# Patient Record
Sex: Male | Born: 1975 | Race: Black or African American | Hispanic: No | Marital: Single | Smoking: Current every day smoker
Health system: Southern US, Community
[De-identification: ages and names within clinical notes are randomized; demographics above are authoritative.]

---

## 2018-01-15 ENCOUNTER — Emergency Department (HOSPITAL_COMMUNITY)
Admission: EM | Admit: 2018-01-15 | Discharge: 2018-01-15 | Disposition: A | Discharge status: Home / Self Care | Payer: Self-pay | Attending: Emergency Medicine | Admitting: Emergency Medicine

## 2018-01-15 ENCOUNTER — Emergency Department (HOSPITAL_COMMUNITY): Payer: Self-pay

## 2018-01-15 DIAGNOSIS — F17200 Nicotine dependence, unspecified, uncomplicated: Secondary | ICD-10-CM | POA: Insufficient documentation

## 2018-01-15 DIAGNOSIS — Z79899 Other long term (current) drug therapy: Secondary | ICD-10-CM | POA: Insufficient documentation

## 2018-01-15 DIAGNOSIS — R42 Dizziness and giddiness: Secondary | ICD-10-CM | POA: Insufficient documentation

## 2018-01-15 DIAGNOSIS — F1610 Hallucinogen abuse, uncomplicated: Secondary | ICD-10-CM | POA: Insufficient documentation

## 2018-01-15 DIAGNOSIS — F1210 Cannabis abuse, uncomplicated: Secondary | ICD-10-CM | POA: Insufficient documentation

## 2018-01-15 DIAGNOSIS — I10 Essential (primary) hypertension: Secondary | ICD-10-CM | POA: Insufficient documentation

## 2018-01-15 LAB — BASIC METABOLIC PANEL
ANION GAP: 12 (ref 5–15)
BUN: 7 mg/dL (ref 6–20)
CO2: 24 mmol/L (ref 22–32)
Calcium: 9.4 mg/dL (ref 8.9–10.3)
Chloride: 104 mmol/L (ref 101–111)
Creatinine, Ser: 1.05 mg/dL (ref 0.61–1.24)
GFR calc Af Amer: >60 mL/min (ref >60)
GFR calc non Af Amer: >60 mL/min (ref >60)
Glucose, Bld: 97 mg/dL (ref 65–99)
POTASSIUM: 3.8 mmol/L (ref 3.5–5.1)
Sodium: 140 mmol/L (ref 135–145)

## 2018-01-15 LAB — CBC
HEMATOCRIT: 53 % — AB (ref 39.0–52.0)
Hemoglobin: 17 g/dL (ref 13.0–17.0)
MCH: 31.7 pg (ref 26.0–34.0)
MCHC: 32.1 g/dL (ref 30.0–36.0)
MCV: 98.7 fL (ref 78.0–100.0)
Platelets: 384 10*3/uL (ref 150–400)
RBC: 5.37 MIL/uL (ref 4.22–5.81)
RDW: 13.2 % (ref 11.5–15.5)
WBC: 6.1 10*3/uL (ref 4.0–10.5)

## 2018-01-15 LAB — URINALYSIS, ROUTINE W REFLEX MICROSCOPIC
Bilirubin Urine: NEGATIVE
Glucose, UA: NEGATIVE mg/dL
Hgb urine dipstick: NEGATIVE
KETONES UR: NEGATIVE mg/dL
Leukocytes, UA: NEGATIVE
NITRITE: NEGATIVE
PH: 7 (ref 5.0–8.0)
PROTEIN: NEGATIVE mg/dL
Specific Gravity, Urine: 1.01 (ref 1.005–1.030)

## 2018-01-15 LAB — CBG MONITORING, ED: Glucose-Capillary: 105 mg/dL — ABNORMAL HIGH (ref 65–99)

## 2018-01-15 MED ORDER — AMLODIPINE BESYLATE 5 MG PO TABS: 5.0000 mg | ORAL_TABLET | Freq: Every day | ORAL | 0 refills | Status: DC

## 2018-01-15 MED ORDER — AMLODIPINE BESYLATE 5 MG PO TABS
5.0000 mg | ORAL_TABLET | Freq: Once | ORAL | Status: AC
  Administered 2018-01-15: 5 mg via ORAL
  Filled 2018-01-15: qty 1

## 2018-01-15 NOTE — ED Notes (Addendum)
Patient complains of intermittent headache and dizziness that started Friday while at work. Patient denies both today. Patient reports his symptoms start when he starts to get hot. He works in a [employer] and states it gets very hot inside. Patient also states he has felt dizzy while working outside in the sun. Patient ambulated for lobby to treatment room independently without difficulty. No neuro deficits.

## 2018-01-15 NOTE — ED Provider Notes (Signed)
[facility] EMERGENCY DEPARTMENT Provider Note   CSN: [id number] Arrival date & time: 01/15/18  7829     History   Chief Complaint Chief Complaint  Patient presents with  . Dizziness    HPI Griffyn Kucinski is a 42 y.o. male.  The history is provided by the patient. No language interpreter was used.  Dizziness   Ervey Fann is a 42 y.o. male who presents to the Emergency Department complaining of dizziness. He reports episodic dizziness since Friday. Friday morning while at work he felt lightheaded and needed to sit down in the symptoms quickly past. Today when he was driving to work he developed recurrent dizziness described as lightheadedness. He decided to come into the emergency department for evaluation at that time. During these episodes he feels hot and sweaty. He denies any headaches, vision changes, chest pain, numbness, weakness, nausea, vomiting, leg swelling or pain. Four years ago he was told he had high blood pressure but never got further evaluation and is not been on any medications. He is a smoker and drinks alcohol on the weekends. He uses marijuana. He has used ecstasy three times, last use was a week and half ago.  No cocaine use.   No past medical history on file.  There are no active problems to display for this patient.         Home Medications    Prior to Admission medications   Medication Sig Start Date End Date Taking? Authorizing Provider  amLODipine (NORVASC) 5 MG tablet Take 1 tablet (5 mg total) by mouth daily. 01/15/18   Tilden Fossa, MD    Family History No family history on file.  Social History Social History   Tobacco Use  . Smoking status: Not on file  Substance Use Topics  . Alcohol use: Not on file  . Drug use: Not on file     Allergies   Patient has no allergy information on record.   Review of Systems Review of Systems  Neurological: Positive for dizziness.  All other systems reviewed and  are negative.    Physical Exam Updated Vital Signs BP (!) 185/120   Pulse 65   Temp 98.1 F (36.7 C) (Oral)   Resp 20   SpO2 97%   Physical Exam  Constitutional: He is oriented to person, place, and time. He appears well-developed and well-nourished.  HENT:  Head: Normocephalic and atraumatic.  Cardiovascular: Normal rate and regular rhythm.  No murmur heard. Pulmonary/Chest: Effort normal and breath sounds normal. No respiratory distress.  Abdominal: Soft. There is no tenderness. There is no rebound and no guarding.  Musculoskeletal: He exhibits no edema or tenderness.  Neurological: He is alert and oriented to person, place, and time. No cranial nerve deficit or sensory deficit. Coordination normal.  No pronator drift.  No ataxia on FTN bilaterally.  Visual fields grossly intact.    Skin: Skin is warm. He is diaphoretic.  Psychiatric: He has a normal mood and affect. His behavior is normal.  Nursing note and vitals reviewed.    ED Treatments / Results  Labs (all labs ordered are listed, but only abnormal results are displayed) Labs Reviewed  CBC - Abnormal; Notable for the following components:      Result Value   HCT 53.0 (*)    All other components within normal limits  URINALYSIS, ROUTINE W REFLEX MICROSCOPIC - Abnormal; Notable for the following components:   Color, Urine STRAW (*)    All other components  within normal limits  CBG MONITORING, ED - Abnormal; Notable for the following components:   Glucose-Capillary 105 (*)    All other components within normal limits  BASIC METABOLIC PANEL  I-STAT TROPONIN, ED    EKG EKG Interpretation  Date/Time:  Tuesday Jan 15 2018 07:42:12 EDT Ventricular Rate:  85 PR Interval:  162 QRS Duration: 82 QT Interval:  392 QTC Calculation: 466 R Axis:   48 Text Interpretation:  Normal sinus rhythm Cannot rule out Inferior infarct , age undetermined Abnormal ECG no prior available for comparison Confirmed by Tilden Fossa  [phone]) on 01/15/2018 8:58:56 AM   Radiology Ct Head Wo Contrast  Result Date: 01/15/2018 CLINICAL DATA:  Headache for 4 days. EXAM: CT HEAD WITHOUT CONTRAST TECHNIQUE: Contiguous axial images were obtained from the base of the skull through the vertex without intravenous contrast. COMPARISON:  None. FINDINGS: Brain: No evidence of acute infarction, hemorrhage, hydrocephalus, extra-axial collection or mass lesion/mass effect. Vascular: Negative. Skull: Intact. Sinuses/Orbits: Negative. Other: None. IMPRESSION: Normal head CT. Electronically Signed   By: Drusilla Kanner M.D.   On: 01/15/2018 11:35    Procedures Procedures (including critical care time)  Medications Ordered in ED Medications  amLODipine (NORVASC) tablet 5 mg (has no administration in time range)     Initial Impression / Assessment and Plan / ED Course  I have reviewed the triage vital signs and the nursing notes.  Pertinent labs & imaging results that were available during my care of the patient were reviewed by me and considered in my medical decision making (see chart for details).     Patient with history of hypertension, not on any antihypertensives here for evaluation of dizziness with diaphoresis, elevated blood pressure. Patient diaphoretic on ED arrival and is quickly cleared. He is well appearing on repeat assessment. No focal neurologic deficits and he is completely asymptomatic on reassessment. Blood pressure is elevated but no evidence of hypertensive urgency/emergency, ACS, dissection, CVA. No evidence of and organ dysfunction. Discussed with patient hypertension and home care. Discussed importance of establishing PCP, will refer to [place] [facility] until he has one established. Discussed close return precautions.  Final Clinical Impressions(s) / ED Diagnoses   Final diagnoses:  Dizziness  Essential hypertension    ED Discharge Orders        Ordered    amLODipine (NORVASC) 5 MG tablet  Daily       01/15/18 1248       Tilden Fossa, MD 01/15/18 1251

## 2019-06-15 IMAGING — CT CT HEAD W/O CM
3 series · 16 of 47 positions shown, 19 images · non-contrast
Comparison: None.

CLINICAL DATA: Headache for 4 days.

EXAM:
CT HEAD WITHOUT CONTRAST
TECHNIQUE: Contiguous axial images were obtained from the base of the skull
through the vertex without intravenous contrast.

[Series 3: head 5.0 h30s · axial · 0.42mm/px · z∈[-175,-30]mm · 10 of 35 slices shown, 13 images]
[im 3/35  brain]
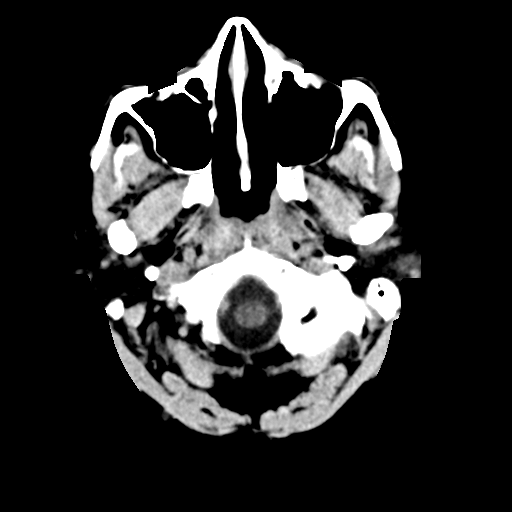
[im 3/35  bone]
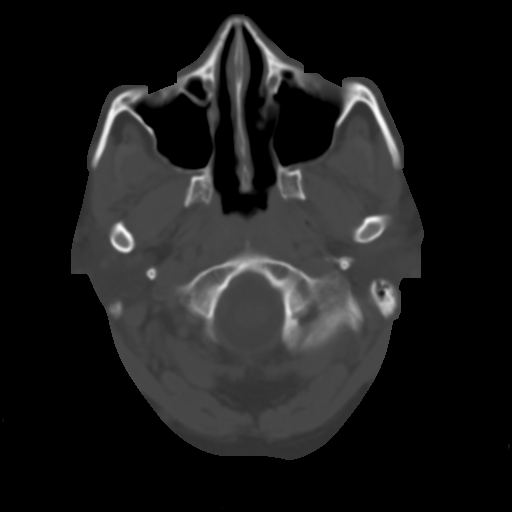
[im 6/35  brain]
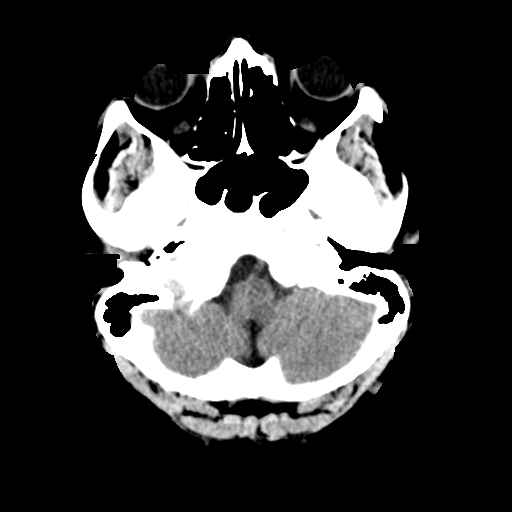
[im 10/35  brain]
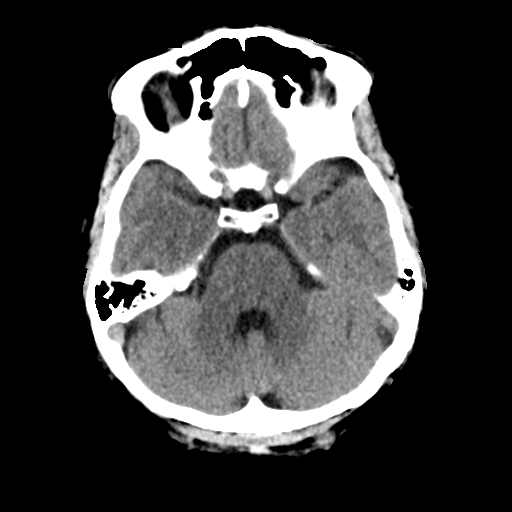
[im 12/35  brain]
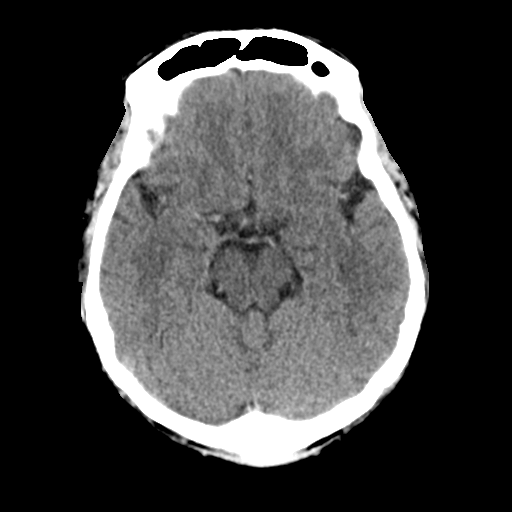
[im 16/35  brain]
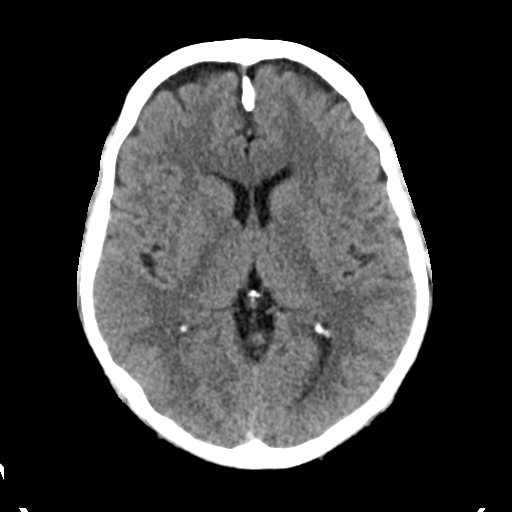
[im 16/35  bone]
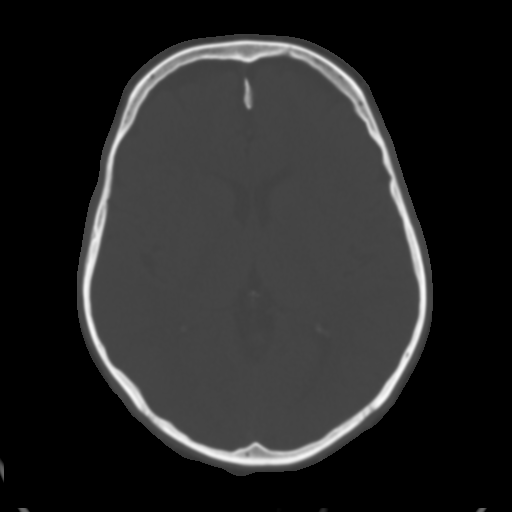
[im 19/35  brain]
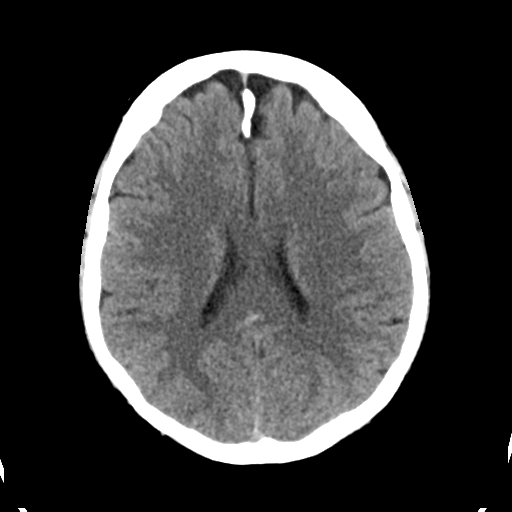
[im 23/35  brain]
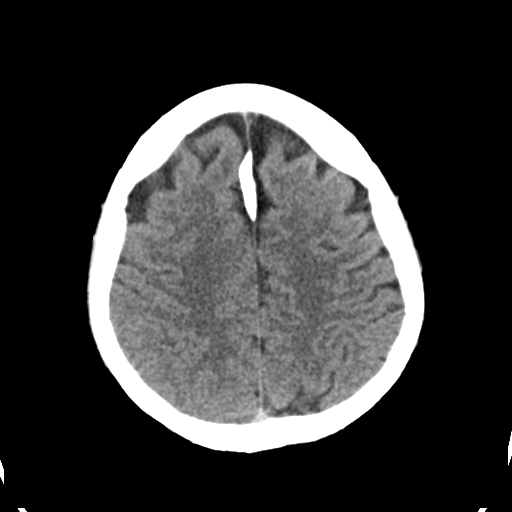
[im 26/35  brain]
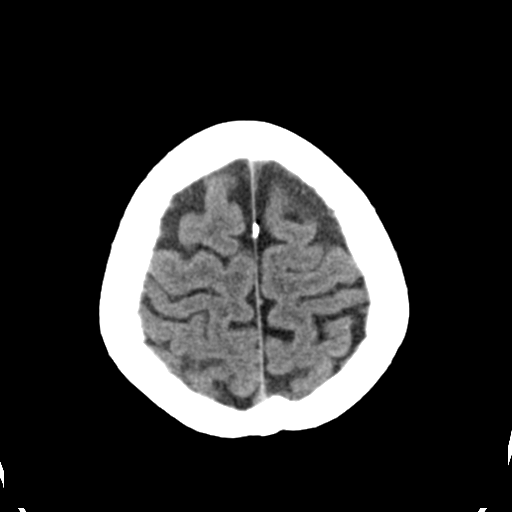
[im 29/35  brain]
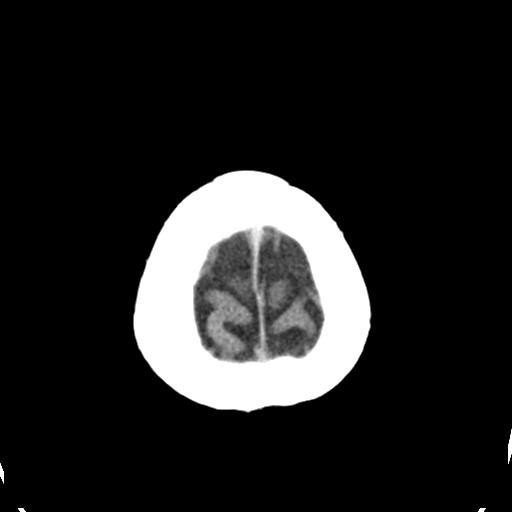
[im 29/35  bone]
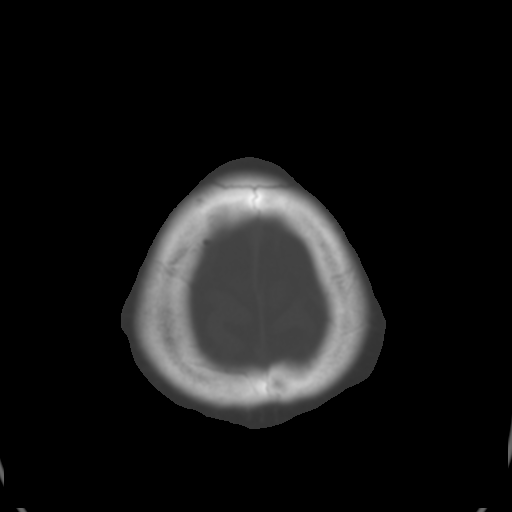
[im 32/35  brain]
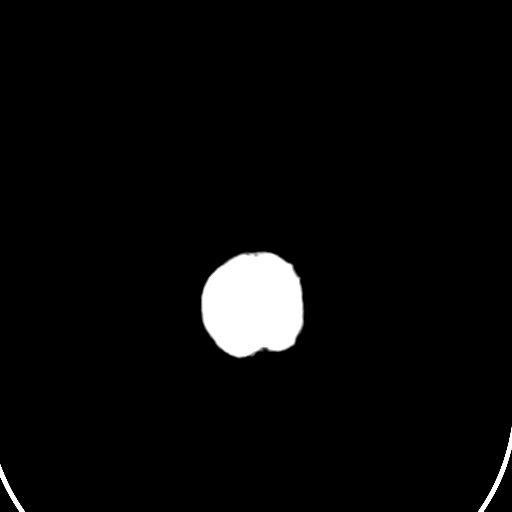

[Series 5: head 3.0 mpr cor · coronal · 0.34mm/px · 3 of 68 slices shown]
[im 23/68  brain]
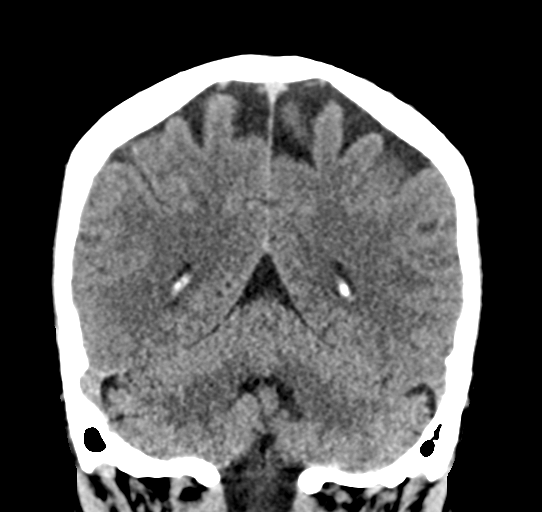
[im 30/68  brain]
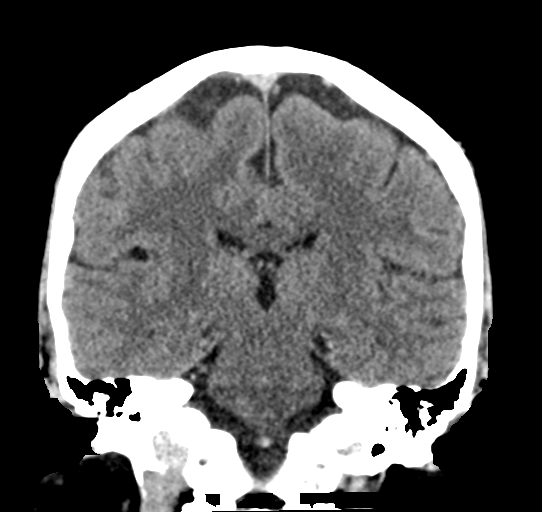
[im 38/68  brain]
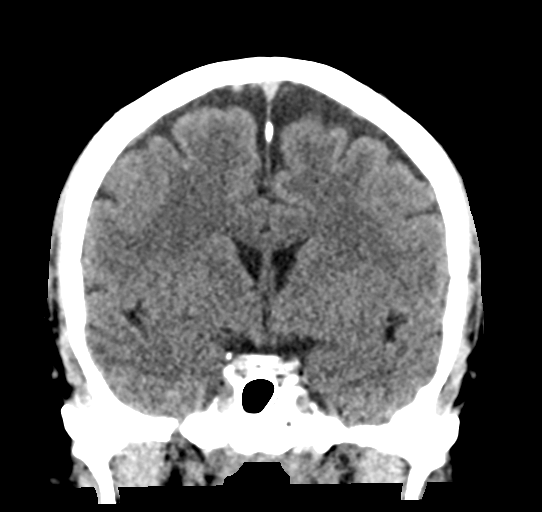

[Series 6: head 3.0 mpr sag · sagittal · 0.34mm/px · 3 of 59 slices shown]
[im 20/59  brain]
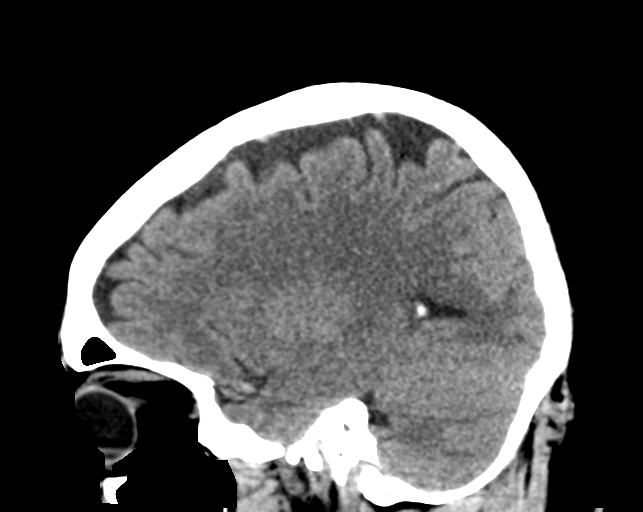
[im 30/59  brain]
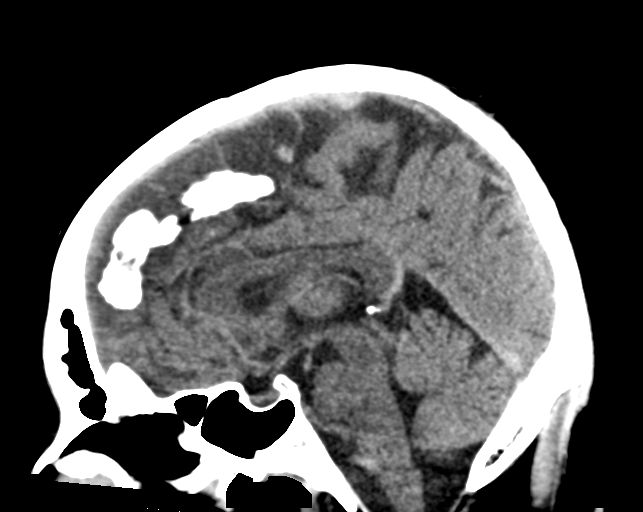
[im 39/59  brain]
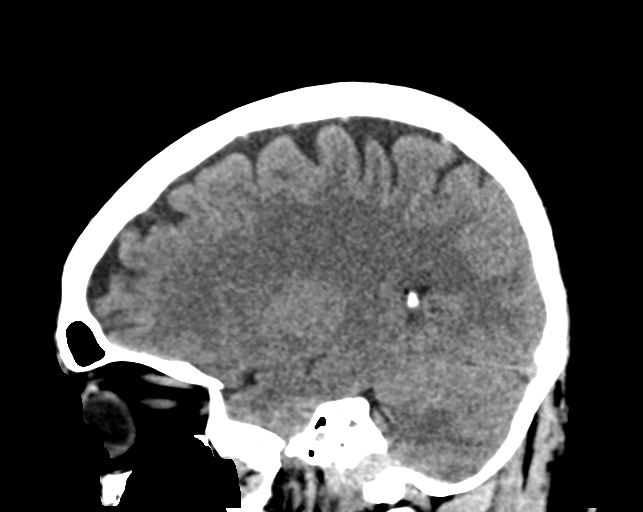

[16 of 47 positions shown; findings below may reference images not displayed]

FINDINGS: Brain: No evidence of acute infarction, hemorrhage, hydrocephalus,
extra-axial collection or mass lesion/mass effect.

Vascular: Negative.

Skull: Intact.

Sinuses/Orbits: Negative.

Other: None.
IMPRESSION: Normal head CT.

## 2023-10-27 ENCOUNTER — Inpatient Hospital Stay (HOSPITAL_COMMUNITY)
Admission: EM | Admit: 2023-10-27 | Discharge: 2024-06-21 | DRG: 003 | Disposition: A | Discharge status: Nursing Home (Includes ICF) | Payer: MEDICAID | Attending: Neurology | Admitting: Neurology

## 2023-10-27 ENCOUNTER — Emergency Department (HOSPITAL_COMMUNITY): Payer: MEDICAID

## 2023-10-27 ENCOUNTER — Encounter (HOSPITAL_COMMUNITY): Payer: Self-pay

## 2023-10-27 ENCOUNTER — Inpatient Hospital Stay (HOSPITAL_COMMUNITY): Payer: MEDICAID

## 2023-10-27 ENCOUNTER — Other Ambulatory Visit: Payer: Self-pay

## 2023-10-27 DIAGNOSIS — F17210 Nicotine dependence, cigarettes, uncomplicated: Secondary | ICD-10-CM | POA: Diagnosis not present

## 2023-10-27 DIAGNOSIS — H109 Unspecified conjunctivitis: Secondary | ICD-10-CM | POA: Diagnosis not present

## 2023-10-27 DIAGNOSIS — R402A Nontraumatic coma due to underlying condition: Secondary | ICD-10-CM | POA: Diagnosis present

## 2023-10-27 DIAGNOSIS — R6510 Systemic inflammatory response syndrome (SIRS) of non-infectious origin without acute organ dysfunction: Secondary | ICD-10-CM | POA: Diagnosis present

## 2023-10-27 DIAGNOSIS — Z79899 Other long term (current) drug therapy: Secondary | ICD-10-CM

## 2023-10-27 DIAGNOSIS — G931 Anoxic brain damage, not elsewhere classified: Secondary | ICD-10-CM | POA: Diagnosis present

## 2023-10-27 DIAGNOSIS — Z430 Encounter for attention to tracheostomy: Secondary | ICD-10-CM

## 2023-10-27 DIAGNOSIS — Z515 Encounter for palliative care: Secondary | ICD-10-CM | POA: Diagnosis not present

## 2023-10-27 DIAGNOSIS — Z7189 Other specified counseling: Secondary | ICD-10-CM | POA: Diagnosis not present

## 2023-10-27 DIAGNOSIS — E43 Unspecified severe protein-calorie malnutrition: Secondary | ICD-10-CM | POA: Diagnosis present

## 2023-10-27 DIAGNOSIS — J95851 Ventilator associated pneumonia: Secondary | ICD-10-CM | POA: Diagnosis not present

## 2023-10-27 DIAGNOSIS — K029 Dental caries, unspecified: Secondary | ICD-10-CM | POA: Diagnosis present

## 2023-10-27 DIAGNOSIS — G9341 Metabolic encephalopathy: Secondary | ICD-10-CM | POA: Diagnosis not present

## 2023-10-27 DIAGNOSIS — Z931 Gastrostomy status: Secondary | ICD-10-CM

## 2023-10-27 DIAGNOSIS — Z8709 Personal history of other diseases of the respiratory system: Secondary | ICD-10-CM | POA: Diagnosis not present

## 2023-10-27 DIAGNOSIS — R403 Persistent vegetative state: Secondary | ICD-10-CM | POA: Diagnosis present

## 2023-10-27 DIAGNOSIS — J9621 Acute and chronic respiratory failure with hypoxia: Secondary | ICD-10-CM | POA: Diagnosis present

## 2023-10-27 DIAGNOSIS — R1311 Dysphagia, oral phase: Secondary | ICD-10-CM | POA: Diagnosis not present

## 2023-10-27 DIAGNOSIS — I471 Supraventricular tachycardia, unspecified: Secondary | ICD-10-CM | POA: Diagnosis not present

## 2023-10-27 DIAGNOSIS — Z789 Other specified health status: Secondary | ICD-10-CM | POA: Diagnosis not present

## 2023-10-27 DIAGNOSIS — E876 Hypokalemia: Secondary | ICD-10-CM | POA: Diagnosis not present

## 2023-10-27 DIAGNOSIS — I613 Nontraumatic intracerebral hemorrhage in brain stem: Secondary | ICD-10-CM | POA: Diagnosis present

## 2023-10-27 DIAGNOSIS — H0520 Unspecified exophthalmos: Secondary | ICD-10-CM | POA: Diagnosis present

## 2023-10-27 DIAGNOSIS — Z91018 Allergy to other foods: Secondary | ICD-10-CM

## 2023-10-27 DIAGNOSIS — Z6841 Body Mass Index (BMI) 40.0 and over, adult: Secondary | ICD-10-CM

## 2023-10-27 DIAGNOSIS — I161 Hypertensive emergency: Secondary | ICD-10-CM | POA: Diagnosis present

## 2023-10-27 DIAGNOSIS — G936 Cerebral edema: Secondary | ICD-10-CM | POA: Diagnosis not present

## 2023-10-27 DIAGNOSIS — B9562 Methicillin resistant Staphylococcus aureus infection as the cause of diseases classified elsewhere: Secondary | ICD-10-CM | POA: Diagnosis not present

## 2023-10-27 DIAGNOSIS — D75839 Thrombocytosis, unspecified: Secondary | ICD-10-CM | POA: Diagnosis not present

## 2023-10-27 DIAGNOSIS — J690 Pneumonitis due to inhalation of food and vomit: Secondary | ICD-10-CM | POA: Diagnosis present

## 2023-10-27 DIAGNOSIS — I629 Nontraumatic intracranial hemorrhage, unspecified: Secondary | ICD-10-CM | POA: Diagnosis present

## 2023-10-27 DIAGNOSIS — I952 Hypotension due to drugs: Secondary | ICD-10-CM | POA: Diagnosis not present

## 2023-10-27 DIAGNOSIS — N179 Acute kidney failure, unspecified: Secondary | ICD-10-CM | POA: Diagnosis not present

## 2023-10-27 DIAGNOSIS — J9600 Acute respiratory failure, unspecified whether with hypoxia or hypercapnia: Secondary | ICD-10-CM

## 2023-10-27 DIAGNOSIS — Z930 Tracheostomy status: Secondary | ICD-10-CM

## 2023-10-27 DIAGNOSIS — A419 Sepsis, unspecified organism: Secondary | ICD-10-CM | POA: Diagnosis not present

## 2023-10-27 DIAGNOSIS — E878 Other disorders of electrolyte and fluid balance, not elsewhere classified: Secondary | ICD-10-CM | POA: Diagnosis present

## 2023-10-27 DIAGNOSIS — I611 Nontraumatic intracerebral hemorrhage in hemisphere, cortical: Secondary | ICD-10-CM | POA: Diagnosis not present

## 2023-10-27 DIAGNOSIS — D509 Iron deficiency anemia, unspecified: Secondary | ICD-10-CM | POA: Diagnosis not present

## 2023-10-27 DIAGNOSIS — Y848 Other medical procedures as the cause of abnormal reaction of the patient, or of later complication, without mention of misadventure at the time of the procedure: Secondary | ICD-10-CM | POA: Diagnosis not present

## 2023-10-27 DIAGNOSIS — D539 Nutritional anemia, unspecified: Secondary | ICD-10-CM | POA: Diagnosis not present

## 2023-10-27 DIAGNOSIS — I609 Nontraumatic subarachnoid hemorrhage, unspecified: Secondary | ICD-10-CM | POA: Diagnosis present

## 2023-10-27 DIAGNOSIS — Y732 Prosthetic and other implants, materials and accessory gastroenterology and urology devices associated with adverse incidents: Secondary | ICD-10-CM | POA: Diagnosis not present

## 2023-10-27 DIAGNOSIS — J189 Pneumonia, unspecified organism: Secondary | ICD-10-CM | POA: Diagnosis not present

## 2023-10-27 DIAGNOSIS — R569 Unspecified convulsions: Secondary | ICD-10-CM | POA: Diagnosis not present

## 2023-10-27 DIAGNOSIS — J9620 Acute and chronic respiratory failure, unspecified whether with hypoxia or hypercapnia: Secondary | ICD-10-CM | POA: Diagnosis not present

## 2023-10-27 DIAGNOSIS — R1310 Dysphagia, unspecified: Secondary | ICD-10-CM | POA: Diagnosis present

## 2023-10-27 DIAGNOSIS — J0410 Acute tracheitis without obstruction: Secondary | ICD-10-CM | POA: Diagnosis not present

## 2023-10-27 DIAGNOSIS — J15212 Pneumonia due to Methicillin resistant Staphylococcus aureus: Secondary | ICD-10-CM | POA: Diagnosis not present

## 2023-10-27 DIAGNOSIS — J9611 Chronic respiratory failure with hypoxia: Secondary | ICD-10-CM | POA: Diagnosis not present

## 2023-10-27 DIAGNOSIS — L8990 Pressure ulcer of unspecified site, unspecified stage: Secondary | ICD-10-CM

## 2023-10-27 DIAGNOSIS — E8729 Other acidosis: Secondary | ICD-10-CM | POA: Diagnosis not present

## 2023-10-27 DIAGNOSIS — I472 Ventricular tachycardia, unspecified: Secondary | ICD-10-CM | POA: Diagnosis not present

## 2023-10-27 DIAGNOSIS — G835 Locked-in state: Secondary | ICD-10-CM | POA: Diagnosis present

## 2023-10-27 DIAGNOSIS — Z978 Presence of other specified devices: Secondary | ICD-10-CM | POA: Diagnosis not present

## 2023-10-27 DIAGNOSIS — B9620 Unspecified Escherichia coli [E. coli] as the cause of diseases classified elsewhere: Secondary | ICD-10-CM | POA: Diagnosis present

## 2023-10-27 DIAGNOSIS — I619 Nontraumatic intracerebral hemorrhage, unspecified: Principal | ICD-10-CM

## 2023-10-27 DIAGNOSIS — I612 Nontraumatic intracerebral hemorrhage in hemisphere, unspecified: Secondary | ICD-10-CM | POA: Diagnosis not present

## 2023-10-27 DIAGNOSIS — E872 Acidosis, unspecified: Secondary | ICD-10-CM | POA: Diagnosis present

## 2023-10-27 DIAGNOSIS — H16212 Exposure keratoconjunctivitis, left eye: Secondary | ICD-10-CM | POA: Diagnosis not present

## 2023-10-27 DIAGNOSIS — T4275XA Adverse effect of unspecified antiepileptic and sedative-hypnotic drugs, initial encounter: Secondary | ICD-10-CM | POA: Diagnosis not present

## 2023-10-27 DIAGNOSIS — Z8614 Personal history of Methicillin resistant Staphylococcus aureus infection: Secondary | ICD-10-CM

## 2023-10-27 DIAGNOSIS — E440 Moderate protein-calorie malnutrition: Secondary | ICD-10-CM | POA: Diagnosis not present

## 2023-10-27 DIAGNOSIS — G935 Compression of brain: Secondary | ICD-10-CM | POA: Diagnosis not present

## 2023-10-27 DIAGNOSIS — K9423 Gastrostomy malfunction: Secondary | ICD-10-CM | POA: Diagnosis not present

## 2023-10-27 DIAGNOSIS — F1190 Opioid use, unspecified, uncomplicated: Secondary | ICD-10-CM | POA: Diagnosis not present

## 2023-10-27 DIAGNOSIS — Z1152 Encounter for screening for COVID-19: Secondary | ICD-10-CM

## 2023-10-27 DIAGNOSIS — J208 Acute bronchitis due to other specified organisms: Secondary | ICD-10-CM | POA: Diagnosis not present

## 2023-10-27 DIAGNOSIS — E538 Deficiency of other specified B group vitamins: Secondary | ICD-10-CM | POA: Diagnosis present

## 2023-10-27 DIAGNOSIS — D649 Anemia, unspecified: Secondary | ICD-10-CM | POA: Diagnosis not present

## 2023-10-27 DIAGNOSIS — I615 Nontraumatic intracerebral hemorrhage, intraventricular: Secondary | ICD-10-CM | POA: Diagnosis present

## 2023-10-27 DIAGNOSIS — Z66 Do not resuscitate: Secondary | ICD-10-CM | POA: Diagnosis present

## 2023-10-27 DIAGNOSIS — I69891 Dysphagia following other cerebrovascular disease: Secondary | ICD-10-CM | POA: Diagnosis not present

## 2023-10-27 DIAGNOSIS — Y95 Nosocomial condition: Secondary | ICD-10-CM | POA: Diagnosis present

## 2023-10-27 DIAGNOSIS — H11422 Conjunctival edema, left eye: Secondary | ICD-10-CM | POA: Diagnosis present

## 2023-10-27 DIAGNOSIS — N390 Urinary tract infection, site not specified: Secondary | ICD-10-CM | POA: Diagnosis present

## 2023-10-27 DIAGNOSIS — F1290 Cannabis use, unspecified, uncomplicated: Secondary | ICD-10-CM | POA: Diagnosis present

## 2023-10-27 DIAGNOSIS — J9622 Acute and chronic respiratory failure with hypercapnia: Secondary | ICD-10-CM | POA: Diagnosis present

## 2023-10-27 DIAGNOSIS — Z09 Encounter for follow-up examination after completed treatment for conditions other than malignant neoplasm: Secondary | ICD-10-CM | POA: Diagnosis not present

## 2023-10-27 DIAGNOSIS — H11412 Vascular abnormalities of conjunctiva, left eye: Secondary | ICD-10-CM | POA: Diagnosis not present

## 2023-10-27 DIAGNOSIS — E870 Hyperosmolality and hypernatremia: Secondary | ICD-10-CM | POA: Diagnosis not present

## 2023-10-27 DIAGNOSIS — M7989 Other specified soft tissue disorders: Secondary | ICD-10-CM | POA: Diagnosis not present

## 2023-10-27 DIAGNOSIS — J9602 Acute respiratory failure with hypercapnia: Secondary | ICD-10-CM | POA: Diagnosis not present

## 2023-10-27 DIAGNOSIS — Z9911 Dependence on respirator [ventilator] status: Secondary | ICD-10-CM

## 2023-10-27 DIAGNOSIS — I10 Essential (primary) hypertension: Secondary | ICD-10-CM | POA: Diagnosis present

## 2023-10-27 DIAGNOSIS — J9601 Acute respiratory failure with hypoxia: Secondary | ICD-10-CM

## 2023-10-27 DIAGNOSIS — H179 Unspecified corneal scar and opacity: Secondary | ICD-10-CM | POA: Diagnosis present

## 2023-10-27 DIAGNOSIS — G8250 Quadriplegia, unspecified: Secondary | ICD-10-CM | POA: Diagnosis not present

## 2023-10-27 DIAGNOSIS — J9811 Atelectasis: Secondary | ICD-10-CM | POA: Diagnosis not present

## 2023-10-27 DIAGNOSIS — D638 Anemia in other chronic diseases classified elsewhere: Secondary | ICD-10-CM | POA: Diagnosis present

## 2023-10-27 DIAGNOSIS — Z8701 Personal history of pneumonia (recurrent): Secondary | ICD-10-CM

## 2023-10-27 DIAGNOSIS — F1990 Other psychoactive substance use, unspecified, uncomplicated: Secondary | ICD-10-CM | POA: Diagnosis not present

## 2023-10-27 DIAGNOSIS — J9691 Respiratory failure, unspecified with hypoxia: Secondary | ICD-10-CM | POA: Diagnosis not present

## 2023-10-27 DIAGNOSIS — H5509 Other forms of nystagmus: Secondary | ICD-10-CM | POA: Diagnosis present

## 2023-10-27 DIAGNOSIS — Z22322 Carrier or suspected carrier of Methicillin resistant Staphylococcus aureus: Secondary | ICD-10-CM

## 2023-10-27 DIAGNOSIS — A4902 Methicillin resistant Staphylococcus aureus infection, unspecified site: Secondary | ICD-10-CM | POA: Diagnosis not present

## 2023-10-27 DIAGNOSIS — B965 Pseudomonas (aeruginosa) (mallei) (pseudomallei) as the cause of diseases classified elsewhere: Secondary | ICD-10-CM | POA: Diagnosis not present

## 2023-10-27 DIAGNOSIS — H168 Other keratitis: Secondary | ICD-10-CM | POA: Diagnosis present

## 2023-10-27 DIAGNOSIS — K567 Ileus, unspecified: Secondary | ICD-10-CM | POA: Diagnosis not present

## 2023-10-27 DIAGNOSIS — R509 Fever, unspecified: Secondary | ICD-10-CM | POA: Diagnosis not present

## 2023-10-27 DIAGNOSIS — J150 Pneumonia due to Klebsiella pneumoniae: Secondary | ICD-10-CM | POA: Diagnosis not present

## 2023-10-27 DIAGNOSIS — R000 Tachycardia, unspecified: Secondary | ICD-10-CM | POA: Diagnosis not present

## 2023-10-27 DIAGNOSIS — G253 Myoclonus: Secondary | ICD-10-CM | POA: Diagnosis present

## 2023-10-27 LAB — I-STAT ARTERIAL BLOOD GAS, ED
Acid-base deficit: 4 mmol/L — ABNORMAL HIGH (ref 0.0–2.0)
Bicarbonate: 26.4 mmol/L (ref 20.0–28.0)
Calcium, Ion: 1.16 mmol/L (ref 1.15–1.40)
HCT: 44 % (ref 39.0–52.0)
Hemoglobin: 15 g/dL (ref 13.0–17.0)
O2 Saturation: 96 %
Patient temperature: 100
Potassium: 3.3 mmol/L — ABNORMAL LOW (ref 3.5–5.1)
Sodium: 143 mmol/L (ref 135–145)
TCO2: 29 mmol/L (ref 22–32)
pCO2 arterial: 73.3 mmHg (ref 32–48)
pH, Arterial: 7.169 — CL (ref 7.35–7.45)
pO2, Arterial: 114 mmHg — ABNORMAL HIGH (ref 83–108)

## 2023-10-27 LAB — HEMOGLOBIN A1C
Hgb A1c MFr Bld: 4.7 % — ABNORMAL LOW (ref 4.8–5.6)
Mean Plasma Glucose: 88.19 mg/dL

## 2023-10-27 LAB — CBC WITH DIFFERENTIAL/PLATELET
Abs Immature Granulocytes: 0.12 10*3/uL — ABNORMAL HIGH (ref 0.00–0.07)
Basophils Absolute: 0.1 10*3/uL (ref 0.0–0.1)
Basophils Relative: 0 %
Eosinophils Absolute: 0.1 10*3/uL (ref 0.0–0.5)
Eosinophils Relative: 0 %
HCT: 48.4 % (ref 39.0–52.0)
Hemoglobin: 15.1 g/dL (ref 13.0–17.0)
Immature Granulocytes: 1 %
Lymphocytes Relative: 8 %
Lymphs Abs: 1.3 10*3/uL (ref 0.7–4.0)
MCH: 30.7 pg (ref 26.0–34.0)
MCHC: 31.2 g/dL (ref 30.0–36.0)
MCV: 98.4 fL (ref 80.0–100.0)
Monocytes Absolute: 1.4 10*3/uL — ABNORMAL HIGH (ref 0.1–1.0)
Monocytes Relative: 9 %
Neutro Abs: 13.5 10*3/uL — ABNORMAL HIGH (ref 1.7–7.7)
Neutrophils Relative %: 82 %
Platelets: 466 10*3/uL — ABNORMAL HIGH (ref 150–400)
RBC: 4.92 MIL/uL (ref 4.22–5.81)
RDW: 13.2 % (ref 11.5–15.5)
WBC: 16.5 10*3/uL — ABNORMAL HIGH (ref 4.0–10.5)
nRBC: 0.1 % (ref 0.0–0.2)

## 2023-10-27 LAB — COMPREHENSIVE METABOLIC PANEL
ALT: 17 U/L (ref 0–44)
AST: 39 U/L (ref 15–41)
Albumin: 2.8 g/dL — ABNORMAL LOW (ref 3.5–5.0)
Alkaline Phosphatase: 53 U/L (ref 38–126)
Anion gap: 9 (ref 5–15)
BUN: 10 mg/dL (ref 6–20)
CO2: 21 mmol/L — ABNORMAL LOW (ref 22–32)
Calcium: 6.5 mg/dL — ABNORMAL LOW (ref 8.9–10.3)
Chloride: 112 mmol/L — ABNORMAL HIGH (ref 98–111)
Creatinine, Ser: 1.86 mg/dL — ABNORMAL HIGH (ref 0.61–1.24)
GFR, Estimated: 44 mL/min — ABNORMAL LOW (ref >60)
Glucose, Bld: 161 mg/dL — ABNORMAL HIGH (ref 70–99)
Potassium: 2.6 mmol/L — CL (ref 3.5–5.1)
Sodium: 142 mmol/L (ref 135–145)
Total Bilirubin: 0.4 mg/dL (ref 0.0–1.2)
Total Protein: 5.6 g/dL — ABNORMAL LOW (ref 6.5–8.1)

## 2023-10-27 LAB — I-STAT VENOUS BLOOD GAS, ED
Acid-base deficit: 5 mmol/L — ABNORMAL HIGH (ref 0.0–2.0)
Bicarbonate: 25.2 mmol/L (ref 20.0–28.0)
Calcium, Ion: 1.01 mmol/L — ABNORMAL LOW (ref 1.15–1.40)
HCT: 49 % (ref 39.0–52.0)
Hemoglobin: 16.7 g/dL (ref 13.0–17.0)
O2 Saturation: 90 %
Potassium: 4.1 mmol/L (ref 3.5–5.1)
Sodium: 141 mmol/L (ref 135–145)
TCO2: 27 mmol/L (ref 22–32)
pCO2, Ven: 65.1 mmHg — ABNORMAL HIGH (ref 44–60)
pH, Ven: 7.197 — CL (ref 7.25–7.43)
pO2, Ven: 74 mmHg — ABNORMAL HIGH (ref 32–45)

## 2023-10-27 LAB — POCT I-STAT 7, (LYTES, BLD GAS, ICA,H+H)
Acid-base deficit: 7 mmol/L — ABNORMAL HIGH (ref 0.0–2.0)
Bicarbonate: 18.3 mmol/L — ABNORMAL LOW (ref 20.0–28.0)
Calcium, Ion: 1.1 mmol/L — ABNORMAL LOW (ref 1.15–1.40)
HCT: 42 % (ref 39.0–52.0)
Hemoglobin: 14.3 g/dL (ref 13.0–17.0)
O2 Saturation: 97 %
Patient temperature: 98.3
Potassium: 5.5 mmol/L — ABNORMAL HIGH (ref 3.5–5.1)
Sodium: 136 mmol/L (ref 135–145)
TCO2: 19 mmol/L — ABNORMAL LOW (ref 22–32)
pCO2 arterial: 34.3 mmHg (ref 32–48)
pH, Arterial: 7.334 — ABNORMAL LOW (ref 7.35–7.45)
pO2, Arterial: 98 mmHg (ref 83–108)

## 2023-10-27 LAB — PROTIME-INR
INR: 1.3 — ABNORMAL HIGH (ref 0.8–1.2)
Prothrombin Time: 16.2 s — ABNORMAL HIGH (ref 11.4–15.2)

## 2023-10-27 LAB — I-STAT CHEM 8, ED
BUN: 15 mg/dL (ref 6–20)
Calcium, Ion: 1.01 mmol/L — ABNORMAL LOW (ref 1.15–1.40)
Chloride: 106 mmol/L (ref 98–111)
Creatinine, Ser: 2.5 mg/dL — ABNORMAL HIGH (ref 0.61–1.24)
Glucose, Bld: 189 mg/dL — ABNORMAL HIGH (ref 70–99)
HCT: 50 % (ref 39.0–52.0)
Hemoglobin: 17 g/dL (ref 13.0–17.0)
Potassium: 4.1 mmol/L (ref 3.5–5.1)
Sodium: 140 mmol/L (ref 135–145)
TCO2: 26 mmol/L (ref 22–32)

## 2023-10-27 LAB — LACTIC ACID, PLASMA
Lactic Acid, Venous: 6.2 mmol/L (ref 0.5–1.9)
Lactic Acid, Venous: 5.8 mmol/L (ref 0.5–1.9)

## 2023-10-27 LAB — I-STAT CG4 LACTIC ACID, ED: Lactic Acid, Venous: 4.4 mmol/L (ref 0.5–1.9)

## 2023-10-27 LAB — TROPONIN I (HIGH SENSITIVITY)
Troponin I (High Sensitivity): 985 ng/L (ref <18)
Troponin I (High Sensitivity): 1163 ng/L (ref <18)

## 2023-10-27 LAB — MAGNESIUM: Magnesium: 2.3 mg/dL (ref 1.7–2.4)

## 2023-10-27 LAB — CBG MONITORING, ED: Glucose-Capillary: 201 mg/dL — ABNORMAL HIGH (ref 70–99)

## 2023-10-27 LAB — BRAIN NATRIURETIC PEPTIDE: B Natriuretic Peptide: 210.4 pg/mL — ABNORMAL HIGH (ref 0.0–100.0)

## 2023-10-27 MED ORDER — SODIUM CHLORIDE 0.9 % IV SOLN
2.0000 g | Freq: Once | INTRAVENOUS | Status: AC
  Administered 2023-10-27: 2 g via INTRAVENOUS
  Filled 2023-10-27: qty 12.5

## 2023-10-27 MED ORDER — POTASSIUM CHLORIDE 10 MEQ/100ML IV SOLN
10.0000 meq | INTRAVENOUS | Status: AC
  Administered 2023-10-27 (×2): 10 meq via INTRAVENOUS
  Filled 2023-10-27 (×2): qty 100

## 2023-10-27 MED ORDER — SENNOSIDES-DOCUSATE SODIUM 8.6-50 MG PO TABS
1.0000 | ORAL_TABLET | Freq: Two times a day (BID) | ORAL | Status: DC
  Administered 2023-10-29 – 2023-10-31 (×6): 1
  Filled 2023-10-27 (×6): qty 1

## 2023-10-27 MED ORDER — VANCOMYCIN HCL 2000 MG/400ML IV SOLN
2000.0000 mg | Freq: Once | INTRAVENOUS | Status: AC
  Administered 2023-10-27: 2000 mg via INTRAVENOUS
  Filled 2023-10-27: qty 400

## 2023-10-27 MED ORDER — PANTOPRAZOLE SODIUM 40 MG IV SOLR
40.0000 mg | Freq: Every day | INTRAVENOUS | Status: DC
  Administered 2023-10-27: 40 mg via INTRAVENOUS
  Filled 2023-10-27: qty 10

## 2023-10-27 MED ORDER — VANCOMYCIN HCL IN DEXTROSE 1-5 GM/200ML-% IV SOLN: 1000.0000 mg | Freq: Once | INTRAVENOUS | Status: DC

## 2023-10-27 MED ORDER — CHLORHEXIDINE GLUCONATE CLOTH 2 % EX PADS
6.0000 | MEDICATED_PAD | Freq: Every day | CUTANEOUS | Status: DC
  Administered 2023-10-27 – 2023-11-12 (×16): 6 via TOPICAL

## 2023-10-27 MED ORDER — LACTATED RINGERS IV BOLUS
1000.0000 mL | Freq: Once | INTRAVENOUS | Status: AC
  Administered 2023-10-28: 1000 mL via INTRAVENOUS

## 2023-10-27 MED ORDER — NALOXONE HCL 0.4 MG/ML IJ SOLN
INTRAMUSCULAR | Status: DC | PRN
  Administered 2023-10-27: 1 mg via INTRAVENOUS

## 2023-10-27 MED ORDER — ACETAMINOPHEN 160 MG/5ML PO SOLN
650.0000 mg | ORAL | Status: DC | PRN
  Administered 2023-11-02 – 2024-06-18 (×100): 650 mg
  Filled 2023-10-27 (×105): qty 20.3

## 2023-10-27 MED ORDER — ETOMIDATE 2 MG/ML IV SOLN
INTRAVENOUS | Status: DC | PRN
  Administered 2023-10-27: 20 mg via INTRAVENOUS

## 2023-10-27 MED ORDER — ACETAMINOPHEN 325 MG PO TABS
650.0000 mg | ORAL_TABLET | ORAL | Status: DC | PRN
  Administered 2023-10-29 – 2024-05-31 (×57): 650 mg
  Filled 2023-10-27 (×57): qty 2

## 2023-10-27 MED ORDER — DOCUSATE SODIUM 50 MG/5ML PO LIQD
100.0000 mg | Freq: Every day | ORAL | Status: DC | PRN
  Administered 2023-11-08 – 2023-11-15 (×2): 100 mg
  Filled 2023-10-27 (×4): qty 10

## 2023-10-27 MED ORDER — POLYETHYLENE GLYCOL 3350 17 G PO PACK
17.0000 g | PACK | Freq: Every day | ORAL | Status: DC | PRN
  Administered 2023-11-08 – 2023-11-15 (×3): 17 g
  Filled 2023-10-27 (×3): qty 1

## 2023-10-27 MED ORDER — SODIUM CHLORIDE 0.9 % IV SOLN
3.0000 g | Freq: Four times a day (QID) | INTRAVENOUS | Status: DC
  Administered 2023-10-28 – 2023-11-02 (×22): 3 g via INTRAVENOUS
  Filled 2023-10-27 (×22): qty 8

## 2023-10-27 MED ORDER — ORAL CARE MOUTH RINSE: 15.0000 mL | OROMUCOSAL | Status: DC | PRN

## 2023-10-27 MED ORDER — FAMOTIDINE IN NACL 20-0.9 MG/50ML-% IV SOLN
20.0000 mg | INTRAVENOUS | Status: DC
  Administered 2023-10-27: 20 mg via INTRAVENOUS
  Filled 2023-10-27: qty 50

## 2023-10-27 MED ORDER — DOCUSATE SODIUM 100 MG PO CAPS: 100.0000 mg | ORAL_CAPSULE | Freq: Two times a day (BID) | ORAL | Status: DC | PRN

## 2023-10-27 MED ORDER — SODIUM CHLORIDE 0.9 % IV SOLN: INTRAVENOUS | Status: AC | PRN

## 2023-10-27 MED ORDER — CLEVIDIPINE BUTYRATE 0.5 MG/ML IV EMUL
0.0000 mg/h | INTRAVENOUS | Status: DC
  Filled 2023-10-27: qty 100

## 2023-10-27 MED ORDER — PROPOFOL 1000 MG/100ML IV EMUL
5.0000 ug/kg/min | INTRAVENOUS | Status: DC
  Administered 2023-10-27: 5 ug/kg/min via INTRAVENOUS
  Administered 2023-10-28: 35 ug/kg/min via INTRAVENOUS
  Administered 2023-10-28: 20 ug/kg/min via INTRAVENOUS
  Administered 2023-10-28: 50 ug/kg/min via INTRAVENOUS
  Administered 2023-10-29: 30 ug/kg/min via INTRAVENOUS
  Administered 2023-10-29: 15 ug/kg/min via INTRAVENOUS
  Administered 2023-10-29: 30 ug/kg/min via INTRAVENOUS
  Administered 2023-10-29: 15 ug/kg/min via INTRAVENOUS
  Administered 2023-10-30: 25 ug/kg/min via INTRAVENOUS
  Administered 2023-10-30 (×2): 15 ug/kg/min via INTRAVENOUS
  Administered 2023-10-31: 25 ug/kg/min via INTRAVENOUS
  Administered 2023-10-31 (×2): 20 ug/kg/min via INTRAVENOUS
  Administered 2023-11-01: 30 ug/kg/min via INTRAVENOUS
  Administered 2023-11-01: 25 ug/kg/min via INTRAVENOUS
  Administered 2023-11-01: 30 ug/kg/min via INTRAVENOUS
  Administered 2023-11-01: 20 ug/kg/min via INTRAVENOUS
  Administered 2023-11-02: 30 ug/kg/min via INTRAVENOUS
  Filled 2023-10-27 (×20): qty 100

## 2023-10-27 MED ORDER — INSULIN ASPART 100 UNIT/ML IJ SOLN
0.0000 [IU] | INTRAMUSCULAR | Status: DC
  Administered 2023-10-28: 2 [IU] via SUBCUTANEOUS
  Administered 2023-10-28: 5 [IU] via SUBCUTANEOUS
  Administered 2023-10-28 – 2023-11-04 (×21): 2 [IU] via SUBCUTANEOUS
  Administered 2023-11-04: 3 [IU] via SUBCUTANEOUS
  Administered 2023-11-04 – 2023-11-05 (×2): 2 [IU] via SUBCUTANEOUS
  Administered 2023-11-05: 3 [IU] via SUBCUTANEOUS
  Administered 2023-11-05: 2 [IU] via SUBCUTANEOUS
  Administered 2023-11-05: 3 [IU] via SUBCUTANEOUS
  Administered 2023-11-06 (×2): 2 [IU] via SUBCUTANEOUS
  Administered 2023-11-07 (×2): 3 [IU] via SUBCUTANEOUS
  Administered 2023-11-07 (×2): 2 [IU] via SUBCUTANEOUS
  Administered 2023-11-08: 3 [IU] via SUBCUTANEOUS
  Administered 2023-11-08 – 2023-11-09 (×4): 2 [IU] via SUBCUTANEOUS
  Administered 2023-11-09: 3 [IU] via SUBCUTANEOUS
  Administered 2023-11-10 – 2023-11-11 (×8): 2 [IU] via SUBCUTANEOUS
  Administered 2023-11-11: 3 [IU] via SUBCUTANEOUS
  Administered 2023-11-12 – 2023-11-30 (×15): 2 [IU] via SUBCUTANEOUS
  Administered 2023-11-30: 5 [IU] via SUBCUTANEOUS
  Administered 2023-12-01: 2 [IU] via SUBCUTANEOUS
  Administered 2023-12-01: 3 [IU] via SUBCUTANEOUS
  Administered 2023-12-01 – 2023-12-04 (×5): 2 [IU] via SUBCUTANEOUS
  Administered 2023-12-04: 3 [IU] via SUBCUTANEOUS
  Administered 2023-12-05: 2 [IU] via SUBCUTANEOUS
  Administered 2023-12-05: 3 [IU] via SUBCUTANEOUS
  Administered 2023-12-06 – 2023-12-10 (×10): 2 [IU] via SUBCUTANEOUS
  Administered 2023-12-10 – 2023-12-11 (×2): 3 [IU] via SUBCUTANEOUS
  Administered 2023-12-11 – 2023-12-17 (×20): 2 [IU] via SUBCUTANEOUS
  Administered 2023-12-17: 3 [IU] via SUBCUTANEOUS
  Administered 2023-12-17 (×3): 2 [IU] via SUBCUTANEOUS
  Administered 2023-12-18: 3 [IU] via SUBCUTANEOUS
  Administered 2023-12-18 – 2023-12-19 (×5): 2 [IU] via SUBCUTANEOUS
  Administered 2023-12-19: 3 [IU] via SUBCUTANEOUS
  Administered 2023-12-20 – 2023-12-21 (×5): 2 [IU] via SUBCUTANEOUS
  Administered 2023-12-21: 3 [IU] via SUBCUTANEOUS
  Administered 2023-12-21 – 2023-12-24 (×12): 2 [IU] via SUBCUTANEOUS
  Administered 2023-12-25: 3 [IU] via SUBCUTANEOUS
  Administered 2023-12-25 – 2023-12-28 (×11): 2 [IU] via SUBCUTANEOUS
  Administered 2023-12-28: 3 [IU] via SUBCUTANEOUS
  Administered 2023-12-29: 2 [IU] via SUBCUTANEOUS
  Administered 2023-12-29: 3 [IU] via SUBCUTANEOUS
  Administered 2023-12-29 – 2023-12-31 (×3): 2 [IU] via SUBCUTANEOUS
  Administered 2023-12-31: 3 [IU] via SUBCUTANEOUS
  Administered 2024-01-01 (×4): 2 [IU] via SUBCUTANEOUS
  Administered 2024-01-01: 3 [IU] via SUBCUTANEOUS
  Administered 2024-01-01 – 2024-01-02 (×3): 2 [IU] via SUBCUTANEOUS
  Administered 2024-01-02 – 2024-01-03 (×2): 3 [IU] via SUBCUTANEOUS
  Administered 2024-01-04 – 2024-01-08 (×18): 2 [IU] via SUBCUTANEOUS
  Administered 2024-01-09 (×2): 3 [IU] via SUBCUTANEOUS
  Administered 2024-01-09 – 2024-01-10 (×4): 2 [IU] via SUBCUTANEOUS
  Administered 2024-01-10: 3 [IU] via SUBCUTANEOUS
  Administered 2024-01-11 – 2024-01-13 (×13): 2 [IU] via SUBCUTANEOUS
  Administered 2024-01-14: 3 [IU] via SUBCUTANEOUS
  Administered 2024-01-14 – 2024-01-15 (×6): 2 [IU] via SUBCUTANEOUS
  Administered 2024-01-15: 3 [IU] via SUBCUTANEOUS
  Administered 2024-01-15: 2 [IU] via SUBCUTANEOUS

## 2023-10-27 MED ORDER — NALOXONE HCL 2 MG/2ML IJ SOSY: 1.0000 mg | PREFILLED_SYRINGE | Freq: Once | INTRAMUSCULAR | Status: DC

## 2023-10-27 MED ORDER — SODIUM CHLORIDE 0.9 % IV BOLUS
1000.0000 mL | Freq: Once | INTRAVENOUS | Status: AC
  Administered 2023-10-27: 1000 mL via INTRAVENOUS

## 2023-10-27 MED ORDER — ONDANSETRON HCL 4 MG/2ML IJ SOLN
4.0000 mg | Freq: Four times a day (QID) | INTRAMUSCULAR | Status: DC | PRN
  Administered 2023-11-04 – 2023-11-14 (×3): 4 mg via INTRAVENOUS
  Filled 2023-10-27 (×4): qty 2

## 2023-10-27 MED ORDER — LACTATED RINGERS IV BOLUS (SEPSIS)
2000.0000 mL | Freq: Once | INTRAVENOUS | Status: AC
  Administered 2023-10-27: 2000 mL via INTRAVENOUS

## 2023-10-27 MED ORDER — CLEVIDIPINE BUTYRATE 0.5 MG/ML IV EMUL
0.0000 mg/h | INTRAVENOUS | Status: DC
  Administered 2023-10-27: 2 mg/h via INTRAVENOUS
  Administered 2023-10-28: 21 mg/h via INTRAVENOUS
  Administered 2023-10-28: 2 mg/h via INTRAVENOUS
  Administered 2023-10-28: 22 mg/h via INTRAVENOUS
  Administered 2023-10-29: 11 mg/h via INTRAVENOUS
  Administered 2023-10-29: 14 mg/h via INTRAVENOUS
  Administered 2023-10-29 (×2): 20 mg/h via INTRAVENOUS
  Administered 2023-10-29: 16 mg/h via INTRAVENOUS
  Administered 2023-10-29: 19 mg/h via INTRAVENOUS
  Administered 2023-10-29: 21 mg/h via INTRAVENOUS
  Administered 2023-10-30: 2 mg/h via INTRAVENOUS
  Administered 2023-10-30: 12 mg/h via INTRAVENOUS
  Administered 2023-10-30: 9 mg/h via INTRAVENOUS
  Administered 2023-10-30: 6 mg/h via INTRAVENOUS
  Administered 2023-10-31: 10 mg/h via INTRAVENOUS
  Administered 2023-10-31 (×2): 12 mg/h via INTRAVENOUS
  Administered 2023-10-31: 11 mg/h via INTRAVENOUS
  Administered 2023-10-31: 12 mg/h via INTRAVENOUS
  Administered 2023-10-31 – 2023-11-01 (×2): 8 mg/h via INTRAVENOUS
  Filled 2023-10-27 (×2): qty 100
  Filled 2023-10-27: qty 50
  Filled 2023-10-27 (×4): qty 100
  Filled 2023-10-27: qty 50
  Filled 2023-10-27 (×17): qty 100

## 2023-10-27 MED ORDER — SODIUM CHLORIDE 0.9 % IV SOLN: INTRAVENOUS | Status: DC

## 2023-10-27 MED ORDER — ACETAMINOPHEN 650 MG RE SUPP
650.0000 mg | RECTAL | Status: DC | PRN
  Administered 2024-02-26: 650 mg via RECTAL
  Filled 2023-10-27: qty 1

## 2023-10-27 MED ORDER — IPRATROPIUM-ALBUTEROL 0.5-2.5 (3) MG/3ML IN SOLN
3.0000 mL | RESPIRATORY_TRACT | Status: DC | PRN
  Administered 2023-11-14 – 2023-12-13 (×8): 3 mL via RESPIRATORY_TRACT
  Filled 2023-10-27 (×8): qty 3

## 2023-10-27 MED ORDER — ORAL CARE MOUTH RINSE
15.0000 mL | OROMUCOSAL | Status: DC
  Administered 2023-10-28 – 2023-12-05 (×461): 15 mL via OROMUCOSAL

## 2023-10-27 MED ORDER — ROCURONIUM BROMIDE 10 MG/ML (PF) SYRINGE
PREFILLED_SYRINGE | INTRAVENOUS | Status: DC | PRN
  Administered 2023-10-27: 100 mg via INTRAVENOUS

## 2023-10-27 MED ORDER — ACETAMINOPHEN 650 MG RE SUPP
650.0000 mg | Freq: Once | RECTAL | Status: AC
  Administered 2023-10-27: 650 mg via RECTAL
  Filled 2023-10-27: qty 1

## 2023-10-27 MED ORDER — LACTATED RINGERS IV SOLN: INTRAVENOUS | Status: DC

## 2023-10-27 MED ORDER — LABETALOL HCL 5 MG/ML IV SOLN
10.0000 mg | Freq: Once | INTRAVENOUS | Status: AC
  Administered 2023-10-27: 10 mg via INTRAVENOUS
  Filled 2023-10-27: qty 4

## 2023-10-27 MED ORDER — STROKE: EARLY STAGES OF RECOVERY BOOK
Freq: Once | Status: AC
  Filled 2023-10-27: qty 1

## 2023-10-27 NOTE — Progress Notes (Signed)
 Pharmacy Antibiotic Note  Garrett [name] is a 48 y.o. male admitted on 10/27/2023 with pneumonia.  Pharmacy has been consulted for Unasyn dosing.Estimated CrCl 19mL/min based on approximate weight and height.   Plan: Unasyn 3g q6h.  Follow culture data for de-escalation.  Monitor renal function for dose adjustments as indicated.  F/u true height and weight for adjustments as needed.      Temp (24hrs), Avg:100.7 F (38.2 C), Min:98.4 F (36.9 C), Max:102 F (38.9 C)  Recent Labs  Lab 10/27/23 1826 10/27/23 1831  WBC 16.5*  --   CREATININE 1.86* 2.50*  LATICACIDVEN  --  4.4*    CrCl cannot be calculated (Unknown ideal weight.).    No Known Allergies  Thank you for allowing pharmacy to be a part of this patient's care.  Estill Batten, PharmD, BCCCP  10/27/2023 9:51 PM

## 2023-10-27 NOTE — Consult Note (Signed)
 NAME:  Garrett [name], MRN:  [id number], DOB:  [birth year], LOS: 0 ADMISSION DATE:  10/27/2023, CONSULTATION DATE:  10/27/2023 REFERRING MD:  Coralee Pesa, DO CHIEF COMPLAINT:  Found Down  History of Present Illness:  48 y/o male with h/o HTN who was found down for unknown downtime by his [place] when she came home from work, he was having agonal breathing.  He apparently had driven her to work this morning.  He has HTN but never checks his BP and does not follow with MD.  She says you can tell when his BP is really high; his eyes get blood shot and he is sweating.  No h/o seizures.  Besides almost daily [name] and cigarette smoking, she is not aware of any other drugs. Drug screen positive for opioids and he was given several doses of Narcan. Patient found to have:  1. 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 Basal cisterns and fourth ventricle are effaced without hydrocephalus at this time. 2. Trace additional subarachnoid hemorrhage along the left parietal convexity. BP in ED 262/156. His cxr revealed a RUL and perihilar infiltrates suggesting aspiration pneumonia. ED labs also revealed PH 7.169, LA 4.4, CPK 985. Patient was intubated in ED. Pertinent  Medical History  HTN  Significant Hospital Events: Including procedures, antibiotic start and stop dates in addition to other pertinent events   3/8: Intubated in ED, pontine bleed/SAH  Interim History / Subjective:    Objective   Blood pressure (!) 118/97, pulse 91, temperature (!) 101.4 F (38.6 C), resp. rate (!) 25, SpO2 96%.    Vent Mode: PRVC FiO2 (%):  [70 %-100 %] 70 % Set Rate:  [22 bmp-25 bmp] 25 bmp Vt Set:  [550 mL] 550 mL PEEP:  [10 cmH20] 10 cmH20 Plateau Pressure:  [25 cmH20-27 cmH20] 25 cmH20  No intake or output data in the 24 hours ending 10/27/23 2026 There were no vitals filed for this  visit.  Examination: General: intubated patient on mech vent NAD HENT: Pupils dilated and fixed (post intubation) Lungs: Rhonchi Rt>Lt, no wheezes or rales Cardiovascular: Reg s1s2 no murmurs or rubs Abdomen: diminished bs , non tender, non distended Extremities: no cyanosis clubbing or edema Neuro: sedated on mech vent   Resolved Hospital Problem list   N/A  Assessment & Plan:  Pontine Hemorrhagic stroke with trace [name]  Neurosurgery and Neurology to see patient  Hypertensive Emergency  Plan to keep BP b/w 130-150, may require drips, but currently his SBP 116 Uncontrolled HTN Drips as needed, drip +/- IVPs Acute Respiratory Failure, hypoxic and hypercapnic Intubated on mech vent Serial ABGs Aspiration Pneumonia Broad spectrum antibiotics Lactic Acidosis Manage acidosis with fluids, vent settings and may need Bicarb AKI Acute on chronic likely secondary to poorly treated h/o HTN Replace electrolytes  Best Practice (right click and "Reselect all SmartList Selections" daily)   Diet/type: NPO DVT prophylaxis SCD Pressure ulcer(s): N/A GI prophylaxis: H2B Lines: N/A Foley:  N/A Code Status:  full code   Labs   CBC: Recent Labs  Lab 10/27/23 1826 10/27/23 1831 10/27/23 1850  WBC 16.5*  --   --   NEUTROABS 13.5*  --   --   HGB 15.1 16.7  17.0 15.0  HCT 48.4 49.0  50.0 44.0  MCV 98.4  --   --   PLT 466*  --   --     Basic Metabolic Panel:  Recent Labs  Lab 10/27/23 1820 10/27/23 1826 10/27/23 1831 10/27/23 1850  NA  --  142 141  140 143  K  --  2.6* 4.1  4.1 3.3*  CL  --  112* 106  --   CO2  --  21*  --   --   GLUCOSE  --  161* 189*  --   BUN  --  10 15  --   CREATININE  --  1.86* 2.50*  --   CALCIUM  --  6.5*  --   --   MG 2.3  --   --   --    GFR: CrCl cannot be calculated (Unknown ideal weight.). Recent Labs  Lab 10/27/23 1826 10/27/23 1831  WBC 16.5*  --   LATICACIDVEN  --  4.4*    Liver Function Tests: Recent Labs  Lab  10/27/23 1826  AST 39  ALT 17  ALKPHOS 53  BILITOT 0.4  PROT 5.6*  ALBUMIN 2.8*   No results for input(s): "LIPASE", "AMYLASE" in the last 168 hours. No results for input(s): "AMMONIA" in the last 168 hours.  ABG    Component Value Date/Time   PHART 7.169 (LL) 10/27/2023 1850   PCO2ART 73.3 (HH) 10/27/2023 1850   PO2ART 114 (H) 10/27/2023 1850   HCO3 26.4 10/27/2023 1850   TCO2 29 10/27/2023 1850   ACIDBASEDEF 4.0 (H) 10/27/2023 1850   O2SAT 96 10/27/2023 1850     Coagulation Profile: Recent Labs  Lab 10/27/23 1826  INR 1.3*    Cardiac Enzymes: No results for input(s): "CKTOTAL", "CKMB", "CKMBINDEX", "TROPONINI" in the last 168 hours.  HbA1C: No results found for: "HGBA1C"  CBG: Recent Labs  Lab 10/27/23 1813  GLUCAP 201*    Review of Systems:   Unable to obtain, patient sedated and intubated  Past Medical History:  He,  has no past medical history on file.   Surgical History:  History reviewed. No pertinent surgical history.   Social History:      Family History:  His family history is not on file.   Allergies No Known Allergies   Home Medications  Prior to Admission medications   Medication Sig Start Date End Date Taking? Authorizing Provider  amLODipine (NORVASC) 5 MG tablet Take 1 tablet (5 mg total) by mouth daily. 01/15/18   Tilden Fossa, MD     Critical care time: 45 min     The patient is critically ill with multiple organ system failure and requires high complexity decision making for assessment and support, frequent evaluation and titration of therapies, advanced monitoring, review of radiographic studies and interpretation of complex data.   Critical Care Time devoted to patient care services, exclusive of separately billable procedures, described in this note is 45 minutes.   Rozann Lesches MD [facility] See Amion for pager  If no response to pager , please call [phone] until 7pm After 7:00 pm call  Elink  [phone] 10/27/2023, 8:26 PM

## 2023-10-27 NOTE — ED Triage Notes (Signed)
 PER EMS: pt is from home, his girlfriend called 911 because he was unresponsive, was found laying in the floor of the bedroom. EMS arrived to find him laying on the floor with white foam on his face, agonal/snoring respirations and pinpoint pupils. + pulse, HR 120. They began bagging him with BVM and gave a total of 6mg  narcan en route. Girlfriend denied pt hx of drug use or seizures. Downtime unknown, unknown last known well.   BP-132/98, HR-20, 93% 15L NRB on arrival

## 2023-10-27 NOTE — ED Notes (Signed)
 7.5 ETT insertedf 25 at the lip by Dr. Hyman Bower with positive color change

## 2023-10-27 NOTE — H&P (Signed)
 NEUROLOGY ADMISSION HISTORY AND PHYSICAL   Date of service: 11-02-2023 Patient Name: Garrett [name] MRN:  [id number] DOB:  02/25/1976 Chief Complaint: Found down unresponsive Requesting Provider: Rozelle Logan, DO  History of Present Illness  Garrett [name] is a 48 y.o. male with hx of ***   ICH Score: 4   ROS  ***Comprehensive ROS performed and pertinent positives documented in HPI  ***Unable to ascertain due to ***  Past History  History reviewed. No pertinent past medical history.  History reviewed. No pertinent surgical history.  Family History: History reviewed. No pertinent family history.  Social History  has no history on file for tobacco use, alcohol use, and drug use.  No Known Allergies  Medications   Current Facility-Administered Medications:    acetaminophen (TYLENOL) suppository 650 mg, 650 mg, Rectal, Once, Horton, Kristie M, DO   ceFEPIme (MAXIPIME) 2 g in sodium chloride 0.9 % 100 mL IVPB, 2 g, Intravenous, Once, Horton, Kristie M, DO, Last Rate: 200 mL/hr at 2023-11-02 1914, 2 g at 11/02/2023 1914   clevidipine (CLEVIPREX) infusion 0.5 mg/mL, 0-21 mg/hr, Intravenous, Continuous, Horton, Kristie M, DO   etomidate (AMIDATE) injection, , Intravenous, Code/Trauma/Sedation Med, Horton, Kristie M, DO, 20 mg at 11/02/2023 1804   lactated ringers bolus 2,000 mL, 2,000 mL, Intravenous, Once, Horton, Kristie M, DO, Last Rate: 2,000 mL/hr at 2023/11/02 1919, 2,000 mL at 11-02-23 1919   lactated ringers infusion, , Intravenous, Continuous, Horton, Kristie M, DO   naloxone [facility]) injection 1 mg, 1 mg, Intravenous, Once, Horton, Kristie M, DO   naloxone (NARCAN) injection, , Intravenous, Code/Trauma/Sedation Med, Horton, Kristie M, DO, 1 mg at 11/02/23 1803   propofol (DIPRIVAN) 1000 MG/100ML infusion, 5-80 mcg/kg/min (Order-Specific), Intravenous, Titrated, Horton, Kristie M, DO, Last Rate: 3 mL/hr at 11-02-2023 1914, 5 mcg/kg/min at 11/02/23 1914   rocuronium  [facility]) injection, , Intravenous, Code/Trauma/Sedation Med, Horton, Kristie M, DO, 100 mg at 11-02-23 1805   vancomycin (VANCOREADY) IVPB 2000 mg/400 mL, 2,000 mg, Intravenous, Once, Francena Hanly, [facility], Last Rate: 200 mL/hr at 11/02/23 1922, 2,000 mg at 11-02-23 1922  Current Outpatient Medications:    amLODipine (NORVASC) 5 MG tablet, Take 1 tablet (5 mg total) by mouth daily., Disp: 30 tablet, Rfl: 0  Vitals   Vitals:   2023-11-02 1817 2023/11/02 1818 11-02-23 1836 11-02-2023 1900  BP:  130/89  (!) 137/107  Pulse: (!) 156 (!) 155 (!) 135 (!) 107  Resp:  (!) 22 (!) 22 (!) 22  Temp: 98.4 F (36.9 C)  100 F (37.8 C) (!) 100.7 F (38.2 C)  TempSrc: Axillary     SpO2:  94% 96% 100%    There is no height or weight on file to calculate BMI.  Physical Exam   Constitutional: Appears well-developed and well-nourished. *** Psych: Affect appropriate to situation. *** Eyes: No scleral injection. *** HENT: No OP obstruction. *** Head: Normocephalic. *** Cardiovascular: Normal rate and regular rhythm. *** Respiratory: Effort normal, non-labored breathing. *** GI: Soft.  No distension. There is no tenderness. *** Skin: WDI. ***  Neurologic Examination   ***  Labs/Imaging/Neurodiagnostic studies   CBC:  Recent Labs  Lab 2023-11-02 1831 November 02, 2023 1850  HGB 16.7  17.0 15.0  HCT 49.0  50.0 44.0   Basic Metabolic Panel:  Lab Results  Component Value Date   NA 143 November 02, 2023   K 3.3 (L) November 02, 2023   CO2 24 01/15/2018   GLUCOSE 189 (H) 2023-11-02   BUN 15 11/02/23  CREATININE 2.50 (H) 10/27/2023   CALCIUM 9.4 01/15/2018   GFRNONAA >60 01/15/2018   GFRAA >60 01/15/2018     ASSESSMENT   Garrett [name] is a 48 y.o. male *** - Exam reveals a comatose patient with absent brainstem reflexes, pinpoint pupils, upper extremity weak extensor response to noxious, weak BLE flexion to noxious - CT head: 1. Acute large (4.1 cm) hemorrhage centered in the pons  with intraventricular extension into the fourth ventricle and also extension into the basal cisterns. Basal cisterns and fourth ventricle are effaced without hydrocephalus at this time. 2. Trace additional subarachnoid hemorrhage along the left parietal convexity.  RECOMMENDATIONS  -   - CCM has been consulted for ventilator and electrolyte management as well as management of sepsis - Neurosurgery consult in anticipation of possible need for ventricular drain if the patient develops hydrocephalus - Repeat CT head in 6 hours ______________________________________________________________________    Dessa Phi, Sequoyah Ramone, MD [facility]

## 2023-10-27 NOTE — Progress Notes (Signed)
Pt transported to CT and back w/o complications.

## 2023-10-27 NOTE — Progress Notes (Signed)
 eLink Physician-Brief Progress Note Patient Name: Garrett [name] DOB: 16-Sep-1975 MRN: [id number]   Date of Service  10/27/2023  HPI/Events of Note  Target SBP 130s-150, but now 101/74 Had been on cleviprex for a few minutes, now off.   eICU Interventions  Will give 1L LR bolus.  Will continue to monitor VS closely.         Mende Biswell M DELA CRUZ 10/27/2023, 11:49 PM  3:03 AM SBP 110-120s after bolus Will give another 1L bolus now.  If still unable to reach target BP, would plan to start on phenylephrine infusion

## 2023-10-27 NOTE — ED Provider Notes (Signed)
 [place] EMERGENCY DEPARTMENT AT [facility] Provider Note   CSN: [id number] Arrival date & time: 10/27/23  1759     History  Chief Complaint  Patient presents with   Unresponsive    Garrett [name] is a 48 y.o. male.  HPI   48 year old male presents emergency department unresponsive, being bagged by EMS.  Patient was reportedly found down at his home, with a bowl nearby but no paraphernalia.  Mom called EMS when she found him unresponsive.  Unknown downtime.  CBG was reportedly 92 prior to arrival.  Patient has dried emesis all over his face.  Patient received 6 mg of intranasal Narcan with minimal response.  Patient intubated on arrival.  Home Medications Prior to Admission medications   Medication Sig Start Date End Date Taking? Authorizing Provider  amLODipine (NORVASC) 5 MG tablet Take 1 tablet (5 mg total) by mouth daily. 01/15/18   Tilden Fossa, MD      Allergies    Patient has no known allergies.    Review of Systems   Review of Systems  Unable to perform ROS: Intubated    Physical Exam Updated Vital Signs BP 130/89   Pulse (!) 135   Temp 100 F (37.8 C)   Resp (!) 22   SpO2 96%  Physical Exam HENT:     Head: Normocephalic.     Mouth/Throat:     Comments: Dried emesis around the nose and mouth Eyes:     Comments: Pinpoint pupils bilaterally  Cardiovascular:     Rate and Rhythm: Tachycardia present.  Pulmonary:     Comments: Intubated, rhonchorous breath sounds bilaterally, worse in the right upper Abdominal:     Palpations: Abdomen is soft.  Skin:    General: Skin is warm.  Neurological:     Comments: Sedated     ED Results / Procedures / Treatments   Labs (all labs ordered are listed, but only abnormal results are displayed) Labs Reviewed  CBG MONITORING, ED - Abnormal; Notable for the following components:      Result Value   Glucose-Capillary 201 (*)    All other components within normal limits  I-STAT CHEM 8, ED -  Abnormal; Notable for the following components:   Creatinine, Ser 2.50 (*)    Glucose, Bld 189 (*)    Calcium, Ion 1.01 (*)    All other components within normal limits  I-STAT CG4 LACTIC ACID, ED - Abnormal; Notable for the following components:   Lactic Acid, Venous 4.4 (*)    All other components within normal limits  I-STAT VENOUS BLOOD GAS, ED - Abnormal; Notable for the following components:   pH, Ven 7.197 (*)    pCO2, Ven 65.1 (*)    pO2, Ven 74 (*)    Acid-base deficit 5.0 (*)    Calcium, Ion 1.01 (*)    All other components within normal limits  I-STAT ARTERIAL BLOOD GAS, ED - Abnormal; Notable for the following components:   pH, Arterial 7.169 (*)    pCO2 arterial 73.3 (*)    pO2, Arterial 114 (*)    Acid-base deficit 4.0 (*)    Potassium 3.3 (*)    All other components within normal limits  CULTURE, BLOOD (ROUTINE X 2)  CULTURE, BLOOD (ROUTINE X 2)  TRIGLYCERIDES  MAGNESIUM  BRAIN NATRIURETIC PEPTIDE  CBC WITH DIFFERENTIAL/PLATELET  COMPREHENSIVE METABOLIC PANEL  TROPONIN I (HIGH SENSITIVITY)    EKG EKG Interpretation Date/Time:  Saturday October 27 2023 18:10:20 EST Ventricular  Rate:  158 PR Interval:  150 QRS Duration:  107 QT Interval:  349 QTC Calculation: 566 R Axis:   73  Text Interpretation: Sinus tachycardia LAE, consider biatrial enlargement Left ventricular hypertrophy Repol abnrm suggests ischemia, diffuse leads Prolonged QT interval Confirmed by Coralee Pesa [phone]) on 10/27/2023 6:40:32 PM  Radiology DG Chest Portable 1 View Result Date: 10/27/2023 CLINICAL DATA:  Post intubation EXAM: PORTABLE CHEST 1 VIEW COMPARISON:  None FINDINGS: Endotracheal tube tip is proximally 2 cm above the carina. Enteric tube tip is in the distal body of the stomach. There are mild patchy medial right upper lobe and right perihilar airspace opacities. There is no pleural effusion or pneumothorax. The cardiomediastinal silhouette is within normal limits. No acute  fractures are seen. IMPRESSION: 1. Endotracheal tube tip is proximally 2 cm above the carina. 2. Mild patchy medial right upper lobe and right perihilar airspace opacities. Electronically Signed   By: Darliss Cheney M.D.   On: 10/27/2023 18:38    Procedures Procedure Name: Intubation Date/Time: 10/27/2023 11:09 PM  Performed by: Rozelle Logan, DOPre-anesthesia Checklist: Patient identified, Patient being monitored, Emergency Drugs available and Suction available Oxygen Delivery Method: Ambu bag Preoxygenation: Pre-oxygenation with 100% oxygen Induction Type: Rapid sequence Ventilation: Mask ventilation without difficulty Laryngoscope Size: Glidescope Grade View: Grade III Tube size: 7.5 mm Number of attempts: 1 Airway Equipment and Method: Video-laryngoscopy Placement Confirmation: ETT inserted through vocal cords under direct vision, CO2 detector and Breath sounds checked- equal and bilateral Secured at: 25 cm Tube secured with: ETT holder Difficulty Due To: Difficult Airway- due to reduced neck mobility and Difficult Airway- due to anterior larynx    .Critical Care  Performed by: Rozelle Logan, DO Authorized by: Rozelle Logan, DO   Critical care provider statement:    Critical care time (minutes):  75   Critical care time was exclusive of:  Separately billable procedures and treating other patients   Critical care was necessary to treat or prevent imminent or life-threatening deterioration of the following conditions:  Sepsis, shock, respiratory failure and CNS failure or compromise   Critical care was time spent personally by me on the following activities:  Development of treatment plan with patient or surrogate, discussions with consultants, evaluation of patient's response to treatment, examination of patient, ordering and review of laboratory studies, ordering and review of radiographic studies, ordering and performing treatments and interventions, pulse oximetry,  re-evaluation of patient's condition and review of old charts   I assumed direction of critical care for this patient from another provider in my specialty: no     Care discussed with: admitting provider       Medications Ordered in ED Medications  naloxone (NARCAN) injection 1 mg (1 mg Intravenous Not Given 10/27/23 1814)  etomidate (AMIDATE) injection (20 mg Intravenous Given 10/27/23 1804)  rocuronium (ZEMURON) injection (100 mg Intravenous Given 10/27/23 1805)  naloxone (NARCAN) injection (1 mg Intravenous Given 10/27/23 1803)  propofol (DIPRIVAN) 1000 MG/100ML infusion (5 mcg/kg/min  100 kg (Order-Specific) Intravenous New Bag/Given 10/27/23 1816)  lactated ringers infusion (has no administration in time range)  lactated ringers bolus 2,000 mL (has no administration in time range)  ceFEPIme (MAXIPIME) 2 g in sodium chloride 0.9 % 100 mL IVPB (has no administration in time range)  vancomycin (VANCOREADY) IVPB 2000 mg/400 mL (has no administration in time range)  labetalol (NORMODYNE) injection 10 mg (has no administration in time range)  sodium chloride 0.9 % bolus 1,000 mL (1,000 mLs Intravenous  New Bag/Given 10/27/23 1814)    ED Course/ Medical Decision Making/ A&P                                 Medical Decision Making Amount and/or Complexity of Data Reviewed Labs: ordered. Radiology: ordered.  Risk OTC drugs. Prescription drug management. Decision regarding hospitalization.   48 year old male presents to the emergency department after being found down, altered.  Prearrival Narcan with no success but patient had pinpoint pupils and was found with a bowl next to him but no paraphernalia.  On arrival patient is being bagged, GCS of 3, pinpoint pupils.  IV access established, no response to IV Narcan.  Patient intubated on arrival.  Patient has rhonchorous breath sounds with dried emesis around his face, concern for aspiration pneumonia which appears confirmed on chest x-ray.   Septic protocol started.    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  At one point patient was hypertensive so Cleviprex ordered.  But patient began to have downtrending blood pressure and developed a fever, most likely secondary to sepsis.  Neurology, Dr. Otelia Limes will admit the patient to neuro ICU.  He has requested ICU consult for sepsis and ventilator management.  They have been contacted and will consult.  Patients evaluation and results requires admission for further treatment and care.  Spoke with hospitalist, reviewed patient's ED course and they accept admission.  Patient agrees with admission plan, offers no new complaints and is stable/unchanged at time of admit.        Final Clinical Impression(s) / ED Diagnoses Final diagnoses:  None    Rx / DC Orders ED Discharge Orders     None         Rozelle Logan, DO 10/27/23 2315

## 2023-10-27 NOTE — ED Notes (Signed)
 Portable chest xray at bedside.

## 2023-10-27 NOTE — Sepsis Progress Note (Signed)
 Elink following code sepsis

## 2023-10-28 ENCOUNTER — Inpatient Hospital Stay (HOSPITAL_COMMUNITY): Payer: MEDICAID

## 2023-10-28 ENCOUNTER — Ambulatory Visit (HOSPITAL_COMMUNITY): Payer: Self-pay

## 2023-10-28 DIAGNOSIS — J690 Pneumonitis due to inhalation of food and vomit: Secondary | ICD-10-CM

## 2023-10-28 DIAGNOSIS — I161 Hypertensive emergency: Secondary | ICD-10-CM

## 2023-10-28 DIAGNOSIS — I613 Nontraumatic intracerebral hemorrhage in brain stem: Secondary | ICD-10-CM | POA: Diagnosis not present

## 2023-10-28 DIAGNOSIS — J9602 Acute respiratory failure with hypercapnia: Secondary | ICD-10-CM | POA: Diagnosis not present

## 2023-10-28 DIAGNOSIS — J9601 Acute respiratory failure with hypoxia: Secondary | ICD-10-CM

## 2023-10-28 LAB — GLUCOSE, CAPILLARY
Glucose-Capillary: 88 mg/dL (ref 70–99)
Glucose-Capillary: 87 mg/dL (ref 70–99)
Glucose-Capillary: 122 mg/dL — ABNORMAL HIGH (ref 70–99)
Glucose-Capillary: 150 mg/dL — ABNORMAL HIGH (ref 70–99)
Glucose-Capillary: 146 mg/dL — ABNORMAL HIGH (ref 70–99)
Glucose-Capillary: 142 mg/dL — ABNORMAL HIGH (ref 70–99)

## 2023-10-28 LAB — CBC
HCT: 44.2 % (ref 39.0–52.0)
Hemoglobin: 14.5 g/dL (ref 13.0–17.0)
MCH: 30.9 pg (ref 26.0–34.0)
MCHC: 32.8 g/dL (ref 30.0–36.0)
MCV: 94.2 fL (ref 80.0–100.0)
Platelets: 379 10*3/uL (ref 150–400)
RBC: 4.69 MIL/uL (ref 4.22–5.81)
RDW: 13.6 % (ref 11.5–15.5)
WBC: 20.4 10*3/uL — ABNORMAL HIGH (ref 4.0–10.5)
nRBC: 0 % (ref 0.0–0.2)

## 2023-10-28 LAB — POCT I-STAT EG7
Acid-base deficit: 7 mmol/L — ABNORMAL HIGH (ref 0.0–2.0)
Bicarbonate: 19 mmol/L — ABNORMAL LOW (ref 20.0–28.0)
Calcium, Ion: 1.06 mmol/L — ABNORMAL LOW (ref 1.15–1.40)
HCT: 37 % — ABNORMAL LOW (ref 39.0–52.0)
Hemoglobin: 12.6 g/dL — ABNORMAL LOW (ref 13.0–17.0)
O2 Saturation: 67 %
Patient temperature: 37.7
Potassium: 5.6 mmol/L — ABNORMAL HIGH (ref 3.5–5.1)
Sodium: 136 mmol/L (ref 135–145)
TCO2: 20 mmol/L — ABNORMAL LOW (ref 22–32)
pCO2, Ven: 39 mmHg — ABNORMAL LOW (ref 44–60)
pH, Ven: 7.3 (ref 7.25–7.43)
pO2, Ven: 40 mmHg (ref 32–45)

## 2023-10-28 LAB — BASIC METABOLIC PANEL
Anion gap: 12 (ref 5–15)
BUN: 15 mg/dL (ref 6–20)
CO2: 20 mmol/L — ABNORMAL LOW (ref 22–32)
Calcium: 8.4 mg/dL — ABNORMAL LOW (ref 8.9–10.3)
Chloride: 107 mmol/L (ref 98–111)
Creatinine, Ser: 2.2 mg/dL — ABNORMAL HIGH (ref 0.61–1.24)
GFR, Estimated: 36 mL/min — ABNORMAL LOW (ref >60)
Glucose, Bld: 93 mg/dL (ref 70–99)
Potassium: 4.2 mmol/L (ref 3.5–5.1)
Sodium: 139 mmol/L (ref 135–145)

## 2023-10-28 LAB — PHOSPHORUS: Phosphorus: 2.4 mg/dL — ABNORMAL LOW (ref 2.5–4.6)

## 2023-10-28 LAB — MRSA NEXT GEN BY PCR, NASAL: MRSA by PCR Next Gen: DETECTED — AB

## 2023-10-28 LAB — MAGNESIUM: Magnesium: 1.9 mg/dL (ref 1.7–2.4)

## 2023-10-28 LAB — TRIGLYCERIDES: Triglycerides: 197 mg/dL — ABNORMAL HIGH (ref <150)

## 2023-10-28 LAB — HIV ANTIBODY (ROUTINE TESTING W REFLEX): HIV Screen 4th Generation wRfx: NONREACTIVE

## 2023-10-28 MED ORDER — PHENYLEPHRINE HCL-NACL 20-0.9 MG/250ML-% IV SOLN: 0.0000 ug/min | INTRAVENOUS | Status: DC

## 2023-10-28 MED ORDER — MUPIROCIN 2 % EX OINT
1.0000 | TOPICAL_OINTMENT | Freq: Two times a day (BID) | CUTANEOUS | Status: AC
  Administered 2023-10-28 – 2023-11-01 (×10): 1 via NASAL
  Filled 2023-10-28: qty 22

## 2023-10-28 MED ORDER — CHLORHEXIDINE GLUCONATE CLOTH 2 % EX PADS
6.0000 | MEDICATED_PAD | Freq: Every day | CUTANEOUS | Status: AC
  Administered 2023-10-28 – 2023-11-01 (×4): 6 via TOPICAL

## 2023-10-28 MED ORDER — AMLODIPINE BESYLATE 10 MG PO TABS
10.0000 mg | ORAL_TABLET | Freq: Every day | ORAL | Status: DC
  Administered 2023-10-28 – 2023-12-29 (×63): 10 mg
  Filled 2023-10-28 (×65): qty 1

## 2023-10-28 MED ORDER — IOHEXOL 350 MG/ML SOLN
60.0000 mL | Freq: Once | INTRAVENOUS | Status: AC | PRN
  Administered 2023-10-28: 60 mL via INTRAVENOUS

## 2023-10-28 MED ORDER — METOPROLOL TARTRATE 25 MG PO TABS
25.0000 mg | ORAL_TABLET | Freq: Two times a day (BID) | ORAL | Status: DC
  Administered 2023-10-28: 25 mg
  Filled 2023-10-28 (×2): qty 1

## 2023-10-28 MED ORDER — LABETALOL HCL 5 MG/ML IV SOLN
20.0000 mg | INTRAVENOUS | Status: DC | PRN
  Administered 2023-10-29: 20 mg via INTRAVENOUS
  Filled 2023-10-28 (×2): qty 4

## 2023-10-28 MED ORDER — LACTATED RINGERS IV BOLUS: 1000.0000 mL | Freq: Once | INTRAVENOUS | Status: DC

## 2023-10-28 MED ORDER — PROSOURCE TF20 ENFIT COMPATIBL EN LIQD
60.0000 mL | Freq: Every day | ENTERAL | Status: DC
  Administered 2023-10-28 – 2023-10-29 (×2): 60 mL
  Filled 2023-10-28 (×2): qty 60

## 2023-10-28 MED ORDER — SODIUM CHLORIDE 0.9 % IV SOLN: 250.0000 mL | INTRAVENOUS | Status: AC

## 2023-10-28 MED ORDER — POTASSIUM & SODIUM PHOSPHATES 280-160-250 MG PO PACK
1.0000 | PACK | Freq: Three times a day (TID) | ORAL | Status: AC
  Administered 2023-10-28 (×3): 1
  Filled 2023-10-28 (×3): qty 1

## 2023-10-28 MED ORDER — VITAL HIGH PROTEIN PO LIQD
1000.0000 mL | ORAL | Status: DC
  Administered 2023-10-28: 1000 mL

## 2023-10-28 MED ORDER — PHENYLEPHRINE HCL-NACL 20-0.9 MG/250ML-% IV SOLN: 25.0000 ug/min | INTRAVENOUS | Status: DC

## 2023-10-28 MED ORDER — FAMOTIDINE 20 MG PO TABS
20.0000 mg | ORAL_TABLET | Freq: Every day | ORAL | Status: DC
  Administered 2023-10-28 – 2023-10-30 (×3): 20 mg
  Filled 2023-10-28 (×3): qty 1

## 2023-10-28 NOTE — Progress Notes (Signed)
 Pt transported to and from CT2 w/o complications.

## 2023-10-28 NOTE — Progress Notes (Signed)
 EEG complete - results pending

## 2023-10-28 NOTE — Progress Notes (Signed)
 Pt transported from CT back to 4N21 without any complications

## 2023-10-28 NOTE — Progress Notes (Addendum)
 STROKE TEAM PROGRESS NOTE   INTERIM HISTORY/SUBJECTIVE Neuroexam is poor, awaiting arrival of family.  Nonsurvivable pontine hemorrhage.  Remains intubated, sedation is off.  Requiring Cleviprex for blood pressure control Awaiting arrival of family for goals of care conversation Neurological exam is quite poor patient comatose, unresponsive, unreactive pupils, sluggish right corneal absent left corneal known cough gag.  There is extensor posturing to sternal rub   OBJECTIVE  CBC    Component Value Date/Time   WBC 16.5 (H) 10/27/2023 1826   RBC 4.92 10/27/2023 1826   HGB 12.6 (L) 10/28/2023 0337   HCT 37.0 (L) 10/28/2023 0337   PLT 466 (H) 10/27/2023 1826   MCV 98.4 10/27/2023 1826   MCH 30.7 10/27/2023 1826   MCHC 31.2 10/27/2023 1826   RDW 13.2 10/27/2023 1826   LYMPHSABS 1.3 10/27/2023 1826   MONOABS 1.4 (H) 10/27/2023 1826   EOSABS 0.1 10/27/2023 1826   BASOSABS 0.1 10/27/2023 1826    BMET    Component Value Date/Time   NA 136 10/28/2023 0337   K 5.6 (H) 10/28/2023 0337   CL 106 10/27/2023 1831   CO2 21 (L) 10/27/2023 1826   GLUCOSE 189 (H) 10/27/2023 1831   BUN 15 10/27/2023 1831   CREATININE 2.50 (H) 10/27/2023 1831   CALCIUM 6.5 (L) 10/27/2023 1826   GFRNONAA 44 (L) 10/27/2023 1826    IMAGING past 24 hours CT ANGIO HEAD NECK W WO CM Result Date: 10/28/2023 CLINICAL DATA:  Stroke, hemorrhagic Hydrocephalus; ICH evaluate for interval development of hydrocephalus, TIMED for 0130 am EXAM: CT ANGIOGRAPHY HEAD AND NECK WITH AND WITHOUT CONTRAST TECHNIQUE: Multidetector CT imaging of the head and neck was performed using the standard protocol during bolus administration of intravenous contrast. Multiplanar CT image reconstructions and MIPs were obtained to evaluate the vascular anatomy. Carotid stenosis measurements (when applicable) are obtained utilizing NASCET criteria, using the distal internal carotid diameter as the denominator. RADIATION DOSE REDUCTION: This exam was  performed according to the departmental dose-optimization program which includes automated exposure control, adjustment of the mA and/or kV according to patient size and/or use of iterative reconstruction technique. CONTRAST:  60mL OMNIPAQUE IOHEXOL 350 MG/ML SOLN COMPARISON:  None Available. FINDINGS: CT HEAD FINDINGS Brain: No substantial change in size of a large intraparenchymal hemorrhage in the pons with similar intraventricular extension. Similar basal cistern effacement. Similar fourth ventricular effacement. Similar small volume of biparietal subarachnoid hemorrhage. No hydrocephalus. No evidence of acute large vascular territory infarct or midline shift. Vascular: See below. Skull: No acute fracture. Sinuses/Orbits: Mild paranasal sinus mucosal thickening. No acute orbital findings. Other: No mastoid effusions. Review of the MIP images confirms the above findings CTA NECK FINDINGS Aortic arch: Great vessel origins are patent. Right carotid system: No evidence of dissection, stenosis (50% or greater), or occlusion. Left carotid system: No evidence of dissection, stenosis (50% or greater), or occlusion. Vertebral arteries: Right dominant. No evidence of dissection, stenosis (50% or greater), or occlusion. Skeleton: No acute abnormality on limited assessment. Other neck: No acute abnormality on limited assessment. Upper chest: Visualized lung apices are clear. Review of the MIP images confirms the above findings CTA HEAD FINDINGS Anterior circulation: Bilateral intracranial ICAs, MCAs, and ACAs are patent without proximal hemodynamically significant stenosis. No aneurysm identified. Posterior circulation: Left non dominant vertebral artery terminates as PICA, anatomic variant. The vertebral arteries, basilar artery and bilateral posterior shoulder patent without proximal hemodynamically significant stenosis. No aneurysm identified. No findings to suggest active extravasation. Venous sinuses: Not well  evaluated  due to arterial timing. Review of the MIP images confirms the above findings IMPRESSION: CT head: 1. No substantial change in size of a large intraparenchymal hemorrhage in the pons with similar intraventricular extension. No hydrocephalus. 2. Similar small volume of biparietal subarachnoid hemorrhage. CTA: 1. No large vessel occlusion or proximal hemodynamically significant stenosis. 2. No aneurysm identified. 3. No evidence of active extravasation. Electronically Signed   By: Feliberto Harts M.D.   On: 10/28/2023 03:41   CT Head Wo Contrast Result Date: 10/27/2023 CLINICAL DATA:  Mental status change, unknown cause EXAM: CT HEAD WITHOUT CONTRAST TECHNIQUE: Contiguous axial images were obtained from the base of the skull through the vertex without intravenous contrast. RADIATION DOSE REDUCTION: This exam was performed according to the departmental dose-optimization program which includes automated exposure control, adjustment of the mA and/or kV according to patient size and/or use of iterative reconstruction technique. COMPARISON:  CT head Jan 15, 2018. FINDINGS: Brain: Large 3.1 x 4.1 x 3.8 cm (estimated volume of 24 mL) hemorrhage centered in the pons with intraventricular extension into the fourth ventricle and also extension into the basal cisterns anteriorly. Basal cisterns and fourth ventricle are effaced without hydrocephalus at this time. Trace subarachnoid hemorrhage along the left parietal convexity. No evidence of acute large vascular territory infarct, visible mass lesion or midline shift. Vascular: No hyperdense vessel. Skull: No acute fracture Sinuses/Orbits: No acute finding. IMPRESSION: 1. 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 Basal cisterns and fourth ventricle are effaced without hydrocephalus at this time. 2. Trace additional subarachnoid hemorrhage along the left  parietal convexity. Findings discussed with Dr. Wilkie Aye via telephone at 7:17 p.m. Electronically Signed   By: Feliberto Harts M.D.   On: 10/27/2023 19:18   DG Chest Portable 1 View Result Date: 10/27/2023 CLINICAL DATA:  Post intubation EXAM: PORTABLE CHEST 1 VIEW COMPARISON:  None FINDINGS: Endotracheal tube tip is proximally 2 cm above the carina. Enteric tube tip is in the distal body of the stomach. There are mild patchy medial right upper lobe and right perihilar airspace opacities. There is no pleural effusion or pneumothorax. The cardiomediastinal silhouette is within normal limits. No acute fractures are seen. IMPRESSION: 1. Endotracheal tube tip is proximally 2 cm above the carina. 2. Mild patchy medial right upper lobe and right perihilar airspace opacities. Electronically Signed   By: Darliss Cheney M.D.   On: 10/27/2023 18:38    Vitals:   10/28/23 0500 10/28/23 0600 10/28/23 0700 10/28/23 0756  BP: (!) 140/119 (!) 152/125 (!) 160/131   Pulse:  71 72 77  Resp: (!) 25 (!) 25 (!) 25 (!) 25  Temp: 99.5 F (37.5 C) 99.3 F (37.4 C) 99.1 F (37.3 C) 98.8 F (37.1 C)  TempSrc:      SpO2:  100% 100% 99%  Weight: 103 kg     Height:         PHYSICAL EXAM General: Unresponsive African-American male Psych: Unresponsive CV: Regular rate and rhythm on monitor Respiratory: Niccoli ventilated GI: Abdomen soft and nontender   NEURO:  Mental Status: Unresponsive, intubated, sedation off  Cranial Nerves:  II: Pupils 1 mm, nonreactive, corneal weak on the right absent on the left III, IV, VI: Eyes midline, downward gaze with absent oculocephalic reflex  V: Sensation is intact to light touch and symmetrical to face.  VII: Face is symmetrical resting IX, X: Cough intact, gag absent XI:  Head is midline XII: ETT in place Motor/Sensory: No purposeful movement.  Trace extensor posturing in lower extremities not history  Sensation-stimulation sensitive myoclonus Coordination:  Deferred Gait- deferred  Most Recent NIH 28     ASSESSMENT/PLAN  Garrett [name] is a 48 y.o. male with history of HTN admitted for ICH.    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 Basal cisterns and fourth ventricle are effaced without hydrocephalus at this time. Trace additional subarachnoid hemorrhage along the left parietal convexity. CTA head & neck no LVO 2D Echo pending  LDL No results found for requested labs within last 1095 days. HgbA1c 4.7 VTE prophylaxis - SCDs No antithrombotic prior to admission, now on No antithrombotic  Therapy recommendations:  Pending Disposition:  pending   Hypertension Home meds:  None Unstable Currently requiring cleviprex PRN labetalol  Blood Pressure Goal: SBP between 130-150 for 24 hours and then less than 160   Hypoxic respiratory failure Aspiration PNA Intubated in the ED PCCM following SVT/VAP per CCM CXR- Mild patchy medial right upper lobe and right perihilar airspace opacities. Lactic- 5.8 WBC 20.4 ABX: Unasyn   Dysphagia OG in place    Diet   Diet NPO time specified    Substance use UDS positive for opioids Narcan given in ED  Other Active Problems Hypokalemia K2.6 -> replaced -> 4.2 AKI Cr 2.2 GFR 36  Hospital day # 1  Patient seen and examined by NP/APP with MD. MD to update note as needed.   Elmer Picker, DNP, FNP-BC [facility] Pager: [phone]  STROKE MD NOTE :  I have personally obtained history,examined this patient, reviewed notes, independently viewed imaging studies, participated in medical decision making and plan of care.ROS completed by me  personally and pertinent positives fully documented  I have made any additions or clarifications directly to the above note. Agree with note above.  Patient with known history of hypertension who was not on medications presented with sudden onset of unresponsiveness and agonal breathing with blood pressure 262/156 on arrival poor neurological exam and CT scan showing a large 4 cm pontine hemorrhage with intraventricular extension and extension into the basal cisterns with cytotoxic edema.  Neurological exam is very poor and this unfortunately is a nonsurvivable brain hemorrhage.  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  I had a long 20-minute discussion with the patient's parents, fianc, sisters and multiple family members about his very poor prognosis and need for prolonged ventilatory support, tracheostomy, PEG tube nursing home care if he were to survive.  Family is quite distraught want time to think about this and decide on goals of care.  Recommend palliative care consult.  Continue ventilatory support and strict control of blood pressure with systolic goal below 160.  Discussed with Dr. Everardo All critical care medicine. This patient is critically ill and at significant risk of neurological worsening, death and care requires constant monitoring of vital signs, hemodynamics,respiratory and cardiac monitoring, extensive review of multiple databases, frequent neurological assessment, discussion with family, other specialists and medical decision making of high complexity.I have made any additions or clarifications directly  to the above note.This critical care time does not reflect procedure time, or teaching time or supervisory time of PA/NP/Med Resident etc but could involve care discussion time.  I spent 60 minutes of neurocritical care time  in the care  of  this patient.     Delia Heady, MD Medical Director [facility] Pager: [phone] 10/28/2023 4:30 PM   To contact Stroke Continuity provider, please refer to [email or web address]. After hours, contact General Neurology

## 2023-10-28 NOTE — Progress Notes (Signed)
 PT Cancellation Note  Patient Details Name: Garrett [name] MRN: [id number] DOB: 1976-07-21   Cancelled Treatment:    Reason Eval/Treat Not Completed: Active bedrest order; will follow up when stable for mobility.    Elray Mcgregor 10/28/2023, 9:06 AM Sheran Lawless, PT Acute Rehabilitation Services [phone] 10/28/2023

## 2023-10-28 NOTE — Consult Note (Signed)
 NAME:  Garrett [name], MRN:  [id number], DOB:  1976/02/28, LOS: 1 ADMISSION DATE:  10/27/2023, CONSULTATION DATE:  10/27/2023 REFERRING MD:  Coralee Pesa, DO CHIEF COMPLAINT:  Found Down  History of Present Illness:  48 y/o male with h/o HTN who was found down for unknown downtime by his [place] when she came home from work, he was having agonal breathing.  He apparently had driven her to work this morning.  He has HTN but never checks his BP and does not follow with MD.  She says you can tell when his BP is really high; his eyes get blood shot and he is sweating.  No h/o seizures.  Besides almost daily [name] and cigarette smoking, she is not aware of any other drugs. Drug screen positive for opioids and he was given several doses of Narcan. Patient found to have:  1. 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 Basal cisterns and fourth ventricle are effaced without hydrocephalus at this time. 2. Trace additional subarachnoid hemorrhage along the left parietal convexity. BP in ED 262/156. His cxr revealed a RUL and perihilar infiltrates suggesting aspiration pneumonia. ED labs also revealed PH 7.169, LA 4.4, CPK 985. Patient was intubated in ED. Pertinent  Medical History  HTN  Significant Hospital Events: Including procedures, antibiotic start and stop dates in addition to other pertinent events   3/8: Intubated in ED, pontine bleed/SAH. CT head 17:09 Acute large hemorrhage 4.1 cm in pons with intraventricular extension without hydrocephalus 3/9: CTA 03:14 AM No change in IPH in pons, similar biparietal SAH  Interim History / Subjective:  Remains sedated on MV Did have hypotension so cleviprex discontinued and given IVF bolus. Cleviprex was restarted for hypertension again this AM  Objective   Blood pressure (!) 160/131, pulse 77, temperature 98.8 F (37.1 C), resp.  rate (!) 25, height 5\' 8"  (1.727 m), weight 103 kg, SpO2 99%.    Vent Mode: PRVC FiO2 (%):  [60 %-100 %] 60 % Set Rate:  [22 bmp-25 bmp] 25 bmp Vt Set:  [550 mL] 550 mL PEEP:  [10 cmH20] 10 cmH20 Plateau Pressure:  [22 cmH20-27 cmH20] 22 cmH20   Intake/Output Summary (Last 24 hours) at 10/28/2023 0817 Last data filed at 10/28/2023 0600 Gross per 24 hour  Intake 3266.61 ml  Output 450 ml  Net 2816.61 ml   Filed Weights   10/27/23 2241 10/28/23 0500  Weight: 103 kg 103 kg   Physical Exam: General: Well-appearing, sedated HENT: [place], AT, ETT in place Eyes: EOMI, no scleral icterus Respiratory: Clear to auscultation bilaterally.  No crackles, wheezing or rales Cardiovascular: RRR, -M/R/G, no JVD GI: BS+, soft, nontender Extremities:-Edema,-tenderness Neuro: Sedated, [facility] Problem list   N/A  Assessment & Plan:  Pontine Hemorrhagic stroke with trace [facility] -Neurology primary. Management and imaging per team -Next CT head at 3PM -Neuroprotective measures -Neuro checks -SBP goal 130-150   Hypertensive Emergency -Cleviprex for goal SBP 130-150 -DC IVF fluids  Acute Respiratory Failure, hypoxic and hypercapnic -Full vent support -LTVV, 4-8cc/kg IBW with goal Pplat<30 and DP<15 -VAP -PAD protocol for RASS -1 -Duonebs PRN  Aspiration Pneumonia: CXR with right opacities -Unasyn  Lactic Acidosis -Manage acidosis with fluids, vent settings and may need Bicarb  AKI -Acute on chronic likely secondary to poorly treated h/o HTN -Replace electrolytes  Best Practice (right click and "Reselect all SmartList Selections" daily)  Diet/type: tubefeeds DVT prophylaxis SCD Pressure ulcer(s): N/A GI prophylaxis: H2B Lines: N/A Foley:  N/A Code Status:  full code  Critical care time: 35 min    The patient is critically ill with multiple organ systems failure and requires high complexity decision making for assessment and support, frequent evaluation and  titration of therapies, application of advanced monitoring technologies and extensive interpretation of multiple databases.  Independent Critical Care Time: 35 Minutes.   Mechele Collin, M.D. [facility] Pulmonary/Critical Care Medicine 10/28/2023 8:18 AM   Please see Amion for pager number to reach on-call Pulmonary and Critical Care Team.

## 2023-10-28 NOTE — Progress Notes (Signed)
 OT Cancellation Note  Patient Details Name: Garrett [name] MRN: [id number] DOB: 1975/10/04   Cancelled Treatment:     OT order received and appreciated however this conflicts with current bedrest order set. Please increase activity tolerance as appropriate and remove bedrest from orders. . Please contact OT at [phone] if bed rest order is discontinued. OT will hold evaluation at this time and will check back as time allows pending increased activity orders. (Intubated in ED)   Mateo Flow 10/28/2023, 7:34 AM

## 2023-10-28 NOTE — Progress Notes (Signed)
 [facility] Referral #  [id number]

## 2023-10-28 NOTE — Progress Notes (Signed)
 Pt transported from 4N21 to CT without any complications.

## 2023-10-29 ENCOUNTER — Inpatient Hospital Stay (HOSPITAL_COMMUNITY): Payer: MEDICAID

## 2023-10-29 DIAGNOSIS — I161 Hypertensive emergency: Secondary | ICD-10-CM

## 2023-10-29 DIAGNOSIS — I613 Nontraumatic intracerebral hemorrhage in brain stem: Secondary | ICD-10-CM | POA: Diagnosis not present

## 2023-10-29 DIAGNOSIS — E8729 Other acidosis: Secondary | ICD-10-CM | POA: Diagnosis not present

## 2023-10-29 DIAGNOSIS — R569 Unspecified convulsions: Secondary | ICD-10-CM | POA: Diagnosis not present

## 2023-10-29 DIAGNOSIS — I629 Nontraumatic intracranial hemorrhage, unspecified: Secondary | ICD-10-CM | POA: Diagnosis not present

## 2023-10-29 DIAGNOSIS — I619 Nontraumatic intracerebral hemorrhage, unspecified: Secondary | ICD-10-CM | POA: Diagnosis not present

## 2023-10-29 DIAGNOSIS — J9601 Acute respiratory failure with hypoxia: Secondary | ICD-10-CM | POA: Diagnosis not present

## 2023-10-29 DIAGNOSIS — J9602 Acute respiratory failure with hypercapnia: Secondary | ICD-10-CM | POA: Diagnosis not present

## 2023-10-29 LAB — LACTIC ACID, PLASMA: Lactic Acid, Venous: 1.5 mmol/L (ref 0.5–1.9)

## 2023-10-29 LAB — MAGNESIUM: Magnesium: 2.2 mg/dL (ref 1.7–2.4)

## 2023-10-29 LAB — BASIC METABOLIC PANEL
Anion gap: 11 (ref 5–15)
BUN: 14 mg/dL (ref 6–20)
CO2: 22 mmol/L (ref 22–32)
Calcium: 8.6 mg/dL — ABNORMAL LOW (ref 8.9–10.3)
Chloride: 109 mmol/L (ref 98–111)
Creatinine, Ser: 1.36 mg/dL — ABNORMAL HIGH (ref 0.61–1.24)
GFR, Estimated: >60 mL/min (ref >60)
Glucose, Bld: 139 mg/dL — ABNORMAL HIGH (ref 70–99)
Potassium: 3.9 mmol/L (ref 3.5–5.1)
Sodium: 142 mmol/L (ref 135–145)

## 2023-10-29 LAB — SODIUM
Sodium: 138 mmol/L (ref 135–145)
Sodium: 141 mmol/L (ref 135–145)

## 2023-10-29 LAB — ECHOCARDIOGRAM COMPLETE
AR max vel: 3.52 cm2
AV Peak grad: 9.4 mmHg
Ao pk vel: 1.53 m/s
Area-P 1/2: 4.8 cm2
Height: 68 in
S' Lateral: 3 cm
Weight: 3710.78 [oz_av]

## 2023-10-29 LAB — PHOSPHORUS: Phosphorus: 2.7 mg/dL (ref 2.5–4.6)

## 2023-10-29 LAB — GLUCOSE, CAPILLARY
Glucose-Capillary: 126 mg/dL — ABNORMAL HIGH (ref 70–99)
Glucose-Capillary: 122 mg/dL — ABNORMAL HIGH (ref 70–99)
Glucose-Capillary: 120 mg/dL — ABNORMAL HIGH (ref 70–99)
Glucose-Capillary: 135 mg/dL — ABNORMAL HIGH (ref 70–99)
Glucose-Capillary: 147 mg/dL — ABNORMAL HIGH (ref 70–99)
Glucose-Capillary: 121 mg/dL — ABNORMAL HIGH (ref 70–99)

## 2023-10-29 MED ORDER — PROSOURCE TF20 ENFIT COMPATIBL EN LIQD
60.0000 mL | Freq: Two times a day (BID) | ENTERAL | Status: DC
  Administered 2023-10-29 – 2023-11-08 (×20): 60 mL
  Filled 2023-10-29 (×20): qty 60

## 2023-10-29 MED ORDER — ALBUMIN HUMAN 5 % IV SOLN: 25.0000 g | Freq: Once | INTRAVENOUS | Status: DC

## 2023-10-29 MED ORDER — METOPROLOL TARTRATE 25 MG PO TABS
25.0000 mg | ORAL_TABLET | Freq: Once | ORAL | Status: AC
  Administered 2023-10-29: 25 mg

## 2023-10-29 MED ORDER — HYDRALAZINE HCL 25 MG PO TABS
25.0000 mg | ORAL_TABLET | Freq: Three times a day (TID) | ORAL | Status: DC
  Administered 2023-10-29 – 2023-10-30 (×4): 25 mg
  Filled 2023-10-29 (×4): qty 1

## 2023-10-29 MED ORDER — OSMOLITE 1.5 CAL PO LIQD
1000.0000 mL | ORAL | Status: DC
  Administered 2023-10-29 – 2023-11-07 (×10): 1000 mL
  Filled 2023-10-29 (×2): qty 1000

## 2023-10-29 MED ORDER — SODIUM CHLORIDE 3 % IV SOLN
INTRAVENOUS | Status: DC
Start: 2023-10-29 — End: 2023-10-31
  Administered 2023-10-29: 75 mL/h via INTRAVENOUS
  Filled 2023-10-29 (×6): qty 500

## 2023-10-29 MED ORDER — SODIUM CHLORIDE 3 % IV BOLUS
250.0000 mL | Freq: Once | INTRAVENOUS | Status: AC
  Administered 2023-10-29: 250 mL via INTRAVENOUS
  Filled 2023-10-29: qty 500

## 2023-10-29 MED ORDER — FENTANYL CITRATE PF 50 MCG/ML IJ SOSY
50.0000 ug | PREFILLED_SYRINGE | INTRAMUSCULAR | Status: DC | PRN
  Administered 2023-10-30 – 2023-11-01 (×6): 50 ug via INTRAVENOUS
  Filled 2023-10-29 (×6): qty 1

## 2023-10-29 MED ORDER — METOPROLOL TARTRATE 50 MG PO TABS
50.0000 mg | ORAL_TABLET | Freq: Two times a day (BID) | ORAL | Status: DC
  Administered 2023-10-29 – 2023-11-03 (×11): 50 mg
  Filled 2023-10-29 (×11): qty 1

## 2023-10-29 MED ORDER — LABETALOL HCL 5 MG/ML IV SOLN
20.0000 mg | INTRAVENOUS | Status: DC | PRN
  Administered 2023-10-29 – 2023-12-31 (×8): 20 mg via INTRAVENOUS
  Filled 2023-10-29 (×10): qty 4

## 2023-10-29 MED ORDER — LABETALOL HCL 5 MG/ML IV SOLN: 20.0000 mg | INTRAVENOUS | Status: DC | PRN

## 2023-10-29 NOTE — Progress Notes (Signed)
 OT Cancellation Note  Patient Details Name: Ryett Hamman MRN: [id number] DOB: [birth year]   Cancelled Treatment:    Reason Eval/Treat Not Completed: Patient not medically ready. Discussed with RN who reports pt not appropriate for therapy evaluation at this time. OT to sign off. Please re-consult if pt becomes medically appropriate.   Tyler Deis, OTR/L [facility] Acute Rehabilitation Office: [phone]   Myrla Halsted 10/29/2023, 8:45 AM

## 2023-10-29 NOTE — Progress Notes (Signed)
 Echocardiogram 2D Echocardiogram has been performed.  Garrett [name] 10/29/2023, 4:31 PM

## 2023-10-29 NOTE — Progress Notes (Signed)
 eLink Physician-Brief Progress Note Patient Name: Garrett [name] DOB: [birth year] MRN: [id number]   Date of Service  10/29/2023  HPI/Events of Note  48 y/o male with h/o HTN who was found down for unknown downtime by his [place] when she came home from work, he was having agonal breathing with Pontine Hemorrhage.  Concern for intermittent pain.  eICU Interventions  Add fentanyl pushes      Intervention Category Minor Interventions: Agitation / anxiety - evaluation and management  Alima Naser 10/29/2023, 3:59 AM

## 2023-10-29 NOTE — Progress Notes (Addendum)
 STROKE TEAM PROGRESS NOTE   INTERIM HISTORY/SUBJECTIVE Patient has remained hemodynamically stable overnight with Tmax of 99.9.  Neurological exam remains poor, and we will start hypertonic saline with bolus of 250 cc and rate of 75 cc/h.  Palliative care consulted for assistance with discussing goals of care with family.  OBJECTIVE  CBC    Component Value Date/Time   WBC 20.4 (H) 10/28/2023 0934   RBC 4.69 10/28/2023 0934   HGB 14.5 10/28/2023 0934   HCT 44.2 10/28/2023 0934   PLT 379 10/28/2023 0934   MCV 94.2 10/28/2023 0934   MCH 30.9 10/28/2023 0934   MCHC 32.8 10/28/2023 0934   RDW 13.6 10/28/2023 0934   LYMPHSABS 1.3 10/27/2023 1826   MONOABS 1.4 (H) 10/27/2023 1826   EOSABS 0.1 10/27/2023 1826   BASOSABS 0.1 10/27/2023 1826    BMET    Component Value Date/Time   NA 138 10/29/2023 1121   K 4.2 10/28/2023 0934   CL 107 10/28/2023 0934   CO2 20 (L) 10/28/2023 0934   GLUCOSE 93 10/28/2023 0934   BUN 15 10/28/2023 0934   CREATININE 2.20 (H) 10/28/2023 0934   CALCIUM 8.4 (L) 10/28/2023 0934   GFRNONAA 36 (L) 10/28/2023 0934    IMAGING past 24 hours EEG adult Result Date: 10/29/2023 Charlsie Quest, MD     10/29/2023  9:01 AM Patient Name: Hence Derrick MRN: [id number] Epilepsy Attending: Charlsie Quest Referring Physician/Provider: Elmer Picker, NP Date: 10/28/2023 Duration: 22.07 mins Patient history:  48 y.o. male with history of HTN admitted for ICH. EEG to evaluate for seizure Level of alertness: Awake, asleep AEDs during EEG study: propofol Technical aspects: This EEG study was done with scalp electrodes positioned according to the 10-20 International system of electrode placement. Electrical activity was reviewed with band pass filter of 1-70Hz , sensitivity of 7 uV/mm, display speed of 18mm/sec with a 60Hz  notched filter applied as appropriate. EEG data were recorded continuously and digitally stored.  Video monitoring was available and reviewed as appropriate.  Description: The posterior dominant rhythm consists of 8-9 Hz activity of moderate voltage (25-35 uV) seen predominantly in posterior head regions, symmetric and reactive to eye opening and eye closing. Sleep was characterized by vertex waves, sleep spindles (12 to 14 Hz), maximal frontocentral region. EEG showed intermittent generalized 3 to 6 Hz theta-delta slowing. Physiologic photic driving was not seen during photic stimulation.  Hyperventilation was not performed.   ABNORMALITY - Intermittent slow, generalized IMPRESSION: This study is suggestive of mild diffuse encephalopathy. No seizures or epileptiform discharges were seen throughout the recording. Priyanka Annabelle Harman   CT HEAD WO CONTRAST ( ) Result Date: 10/28/2023 CLINICAL DATA:  Stroke, hemorrhagic EXAM: CT HEAD WITHOUT CONTRAST TECHNIQUE: Contiguous axial images were obtained from the base of the skull through the vertex without intravenous contrast. RADIATION DOSE REDUCTION: This exam was performed according to the departmental dose-optimization program which includes automated exposure control, adjustment of the mA and/or kV according to patient size and/or use of iterative reconstruction technique. COMPARISON:  CT head March 8 25. FINDINGS: Brain: No substantial change in acute intraparenchymal hemorrhage within the pons with mild increase in the intraventricular extension into the adjacent fourth ventricle and layering in the occipital horn of the left lateral ventricle. Slight increase in small volume of subarachnoid hemorrhage along the parietal convexities. No midline shift. Effacement of the fourth ventricle without hydrocephalus. Vascular: No hyperdense vessel. Skull: No acute fracture. Sinuses/Orbits: Mild paranasal sinus mucosal thickening. No acute orbital findings. Other:  No mastoid effusions. IMPRESSION: 1. No substantial change in acute intraparenchymal hemorrhage within the pons with mild increase in the intraventricular extension into  the adjacent fourth ventricle and layering in the occipital horn of the left lateral ventricle. No hydrocephalus. 2. Mild increase in small volume of subarachnoid hemorrhage along the parietal convexities. Electronically Signed   By: Feliberto Harts M.D.   On: 10/28/2023 19:19    Vitals:   10/29/23 1212 10/29/23 1215 10/29/23 1230 10/29/23 1245  BP:  (!) 146/93 (!) 140/93 137/87  Pulse: (!) 104 (!) 103 98 94  Resp: (!) 29 (!) 24 (!) 25 (!) 25  Temp: 99.5 F (37.5 C) 99.5 F (37.5 C) 99.7 F (37.6 C) 99.5 F (37.5 C)  TempSrc:      SpO2: 96% 95% 94% 95%  Weight:      Height:         PHYSICAL EXAM General: Unresponsive African-American male Psych: Unresponsive CV: Regular rate and rhythm on monitor Respiratory: Mechanically ventilated   NEURO:  Mental Status: Unresponsive, intubated, sedation with low-dose propofol (coughing and posturing when propofol turned off)  Cranial Nerves:  II: Pupils pinpoint, nonreactive, corneal absent bilaterally III, IV, VI: Eyes midline,  absent oculocephalic reflex  VII: Face is symmetrical resting with ET tube present IX, X: Cough intact, gag absent XI: Head is midline XII: ETT in place Motor/Sensory: No purposeful movement.  Trace extensor posturing in lower extremities to noxious  Sensation-appears intact to noxious in lower extremities Coordination: Deferred Gait- deferred  Most Recent NIH 69     ASSESSMENT/PLAN  Mr. Kainoah Bartosiewicz is a 48 y.o. male with history of HTN admitted for ICH.    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 Basal cisterns and fourth ventricle are effaced without hydrocephalus at  this time. Trace additional subarachnoid hemorrhage along the left parietal convexity. CTA head & neck no LVO 2D Echo pending  LDL No results found for requested labs within last 1095 days. HgbA1c 4.7 VTE prophylaxis - SCDs No antithrombotic prior to admission, now on No antithrombotic  Therapy recommendations:  Pending Disposition:  pending   Cerebral edema (brain compression) Neuro exam remains poor, given concern for edema in the brainstem and surrounding structures Will start 3% hypertonic saline with 250 cc bolus and rate of 75 cc/h Measure sodium every 6 hours, goal 150-155  Hypertension Home meds:  None Unstable Currently requiring cleviprex PRN labetalol  Blood Pressure Goal: SBP between 130-150 for 24 hours and then less than 160   Hypoxic respiratory failure Aspiration PNA Intubated in the ED PCCM following SVT/VAP per CCM CXR- Mild patchy medial right upper lobe and right perihilar airspace opacities. Lactic- 5.8 WBC 20.4 ABX: Unasyn   Dysphagia OG in place    Diet   Diet NPO time specified    Substance use UDS positive for opioids Narcan given in ED  Other Active Problems Hypokalemia K2.6 -> replaced -> 4.2 AKI Cr 2.2 GFR 36  Hospital day # 2  Patient seen and examined by NP/APP with MD. MD to update note as needed.   Cortney E Ernestina Columbia , MSN, AGACNP-BC [facility] See Amion for schedule and pager information 10/29/2023 1:21 PM  I have personally  obtained history,examined this patient, reviewed notes, independently viewed imaging studies, participated in medical decision making and plan of care.ROS completed by me personally and pertinent positives fully documented  I have made any additions or clarifications directly to the above note. Agree with note above.  Patient neurological exam remains quite poor with patient comatose unresponsive with fixed nonreactive pupils, corneal reflexes and only a weak cough and gag with some extensor  posturing in the lower extremities to noxious stimuli.  Blood pressure is controlled but requiring IV drips.  Plan to start hypertonic saline with serum sodium goal 150-155.  Long discussion at the bedside with the patient's fianc and mother over the phone about his stable prognosis and family struggling with making decisions and await palliative care team consult.  Continue ongoing medical therapy and strict control of hypertension blood pressure goal below 160 systolic.  Discussed with Dr. Isaiah Serge critical care medicine This patient is critically ill and at significant risk of neurological worsening, death and care requires constant monitoring of vital signs, hemodynamics,respiratory and cardiac monitoring, extensive review of multiple databases, frequent neurological assessment, discussion with family, other specialists and medical decision making of high complexity.I have made any additions or clarifications directly to the above note.This critical care time does not reflect procedure time, or teaching time or supervisory time of PA/NP/Med Resident etc but could involve care discussion time.  I spent 30 minutes of neurocritical care time  in the care of  this patient.      Delia Heady, MD Medical Director [facility] Pager: [phone] 10/29/2023 3:08 PM   To contact Stroke Continuity provider, please refer to [email or web address]. After hours, contact General Neurology

## 2023-10-29 NOTE — Progress Notes (Addendum)
 NAME:  Garrett [name], MRN:  [id number], DOB:  [birth year], LOS: 2 ADMISSION DATE:  10/27/2023, CONSULTATION DATE:  10/27/2023 REFERRING MD:  Coralee Pesa, DO CHIEF COMPLAINT:  Found Down  History of Present Illness:  48 y/o male with h/o HTN who was found down for unknown downtime by his [place] when she came home from work, he was having agonal breathing.  He apparently had driven her to work this morning.  He has HTN but never checks his BP and does not follow with MD.  She says you can tell when his BP is really high; his eyes get blood shot and he is sweating.  No h/o seizures.  Besides almost daily [name] and cigarette smoking, she is not aware of any other drugs. Drug screen positive for opioids and he was given several doses of Narcan. Patient found to have:  1. 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 Basal cisterns and fourth ventricle are effaced without hydrocephalus at this time. 2. Trace additional subarachnoid hemorrhage along the left parietal convexity. BP in ED 262/156. His cxr revealed a RUL and perihilar infiltrates suggesting aspiration pneumonia. ED labs also revealed PH 7.169, LA 4.4, CPK 985. Patient was intubated in ED. Pertinent  Medical History  HTN  Significant Hospital Events: Including procedures, antibiotic start and stop dates in addition to other pertinent events   3/8: Intubated in ED, pontine bleed/SAH. CT head 17:09 Acute large hemorrhage 4.1 cm in pons with intraventricular extension without hydrocephalus 3/9: CTA 03:14 AM No change in IPH in pons, similar biparietal SAH  Interim History / Subjective:   Remains on the ventilator and Cleviprex drip  Objective   Blood pressure (!) 146/92, pulse 93, temperature 99.7 F (37.6 C), resp. rate (!) 25, height 5\' 8"  (1.727 m), weight 105.2 kg, SpO2 91%.    Vent Mode: AC FiO2 (%):  [40  %-50 %] 40 % Set Rate:  [25 bmp] 25 bmp Vt Set:  [550 mL] 550 mL PEEP:  [8 cmH20-10 cmH20] 8 cmH20 Plateau Pressure:  [20 cmH20-24 cmH20] 20 cmH20   Intake/Output Summary (Last 24 hours) at 10/29/2023 0843 Last data filed at 10/29/2023 0800 Gross per 24 hour  Intake 2629.7 ml  Output 1270 ml  Net 1359.7 ml   Filed Weights   10/27/23 2241 10/28/23 0500 10/29/23 0500  Weight: 103 kg 103 kg 105.2 kg   Physical Exam: Blood pressure (!) 146/92, pulse 93, temperature 99.7 F (37.6 C), resp. rate (!) 25, height 5\' 8"  (1.727 m), weight 105.2 kg, SpO2 91%. Gen:      No acute distress HEENT:  EOMI, sclera anicteric Neck:     No masses; no thyromegaly, ET tube Lungs:    Clear to auscultation bilaterally; normal respiratory effort CV:         Regular rate and rhythm; no murmurs Abd:      + bowel sounds; soft, non-tender; no palpable masses, no distension Ext:    No edema; adequate peripheral perfusion Skin:      Warm and dry; no rash Neuro: alert and oriented x 3 Psych: normal mood and affect   Lab/imaging reviewed Significant for BUN/creatinine 16/2.20 WBC 20.4 No new imaging  General: Well-appearing, sedated HENT: [place], AT, ETT in place Eyes: EOMI, no scleral icterus Respiratory: Clear to auscultation bilaterally.  No crackles, wheezing or rales Cardiovascular: RRR, -M/R/G, no JVD GI: BS+, soft, nontender Extremities:-Edema,-tenderness Neuro:  Sedated, PERRL   Resolved Hospital Problem list   N/A  Assessment & Plan:  Pontine Hemorrhagic stroke with trace [facility] -Neurology primary. Management and imaging per team -Neuroprotective measures -Neuro checks -SBP goal 130-150   Hypertensive Emergency -Cleviprex for goal SBP 130-150 On Norvasc 10 mg  Acute Respiratory Failure, hypoxic and hypercapnic -Full vent support No plans on weaning at present due to mental status Continue DuoNebs, follow intermittent chest x-ray  Aspiration Pneumonia: CXR with right  opacities -Unasyn  Lactic Acidosis Repeat lactic acid  AKI -Acute on chronic likely secondary to poorly treated h/o HTN -Replace electrolytes  Best Practice (right click and "Reselect all SmartList Selections" daily)   Diet/type: tubefeeds DVT prophylaxis SCD Pressure ulcer(s): N/A GI prophylaxis: H2B Lines: N/A Foley:  N/A Code Status:  full code  Critical care time:    The patient is critically ill with multiple organ system failure and requires high complexity decision making for assessment and support, frequent evaluation and titration of therapies, advanced monitoring, review of radiographic studies and interpretation of complex data.   Critical Care Time devoted to patient care services, exclusive of separately billable procedures, described in this note is 35 minutes.   Chilton Greathouse MD [place] [facility] & [facility] See Amion for pager  If no response to pager , please call [phone] until 7pm After 7:00 pm call Elink  [phone] 10/29/2023, 8:44 AM

## 2023-10-29 NOTE — Progress Notes (Signed)
 PT Cancellation Note  Patient Details Name: Garrett [name] MRN: [id number] DOB: Aug 05, 1976   Cancelled Treatment:    Reason Eval/Treat Not Completed: Patient not medically ready (discussed with RN; pt is not appropriate to participate in therapy evaluation. Will sign off from acute PT. Please re-consult if status changes).  Lillia Pauls, PT, DPT Acute Rehabilitation Services Office [phone]    [name] 10/29/2023, 8:39 AM

## 2023-10-29 NOTE — Progress Notes (Signed)
 Initial Nutrition Assessment  DOCUMENTATION CODES:   Not applicable  INTERVENTION:   Initiate tube feeding via OG tube: Osmolite 1.5 at 55 ml/h (1320 ml per day)  Prosource TF20 60 ml BID  Provides 2160 kcal, 123 gm protein, 1003 ml free water daily   NUTRITION DIAGNOSIS:   Inadequate oral intake related to inability to eat as evidenced by NPO status.  GOAL:   Patient will meet greater than or equal to 90% of their needs  MONITOR:   TF tolerance, I & O's  REASON FOR ASSESSMENT:   Consult Enteral/tube feeding initiation and management  ASSESSMENT:   Pt with PMH of HTN, daily mariajuana, and smoker admitted after being found down at home with pontine hemorrhagic stroke and trace SAH. UDS positive for opioids. Noted aspiration PNA on admission.  Pt discussed during ICU rounds and with RN and MD.  Per neurology pt with very poor prognosis and nonsurvivable SAH.   Spoke with fiance who is at bedside. She reports that pt eats ~ 2 meals per day. Lunch time and dinner. These are usually fast food meals. Unaware of any weight changes PTA but state he did want to lose a few pounds.   3/8 - admitted with [facility], intubated 3/9 - Adult TF protocol started  Medications reviewed and include: pepcid, novolog SSI every 4, senokot-s BID Cleviprex @ 38 ml/hr providing 1824 kcal  Propofol @ 9 ml/hr providing 237 kcal   Labs reviewed:  A1C: 4.7 CBG's: 87-146   18 F OG: per xray tube tip in distal body of the stomach   NUTRITION - FOCUSED PHYSICAL EXAM:  Flowsheet Row Most Recent Value  Orbital Region No depletion  Upper Arm Region No depletion  Thoracic and Lumbar Region No depletion  Buccal Region Unable to assess  [ETT holder]  Temple Region No depletion  Clavicle Bone Region No depletion  Clavicle and Acromion Bone Region No depletion  Scapular Bone Region No depletion  Dorsal Hand Unable to assess  [edema]  Patellar Region No depletion  Anterior Thigh Region No  depletion  Posterior Calf Region No depletion  Edema (RD Assessment) None  Hair Reviewed  Eyes Unable to assess  Mouth Unable to assess  Skin Reviewed  Nails Reviewed  [pale nail beds]       Diet Order:   Diet Order             Diet NPO time specified  Diet effective now                   EDUCATION NEEDS:   No education needs have been identified at this time  Skin:  Skin Assessment: Reviewed RN Assessment  Last BM:  unknown  Height:   Ht Readings from Last 1 Encounters:  10/27/23 5\' 8"  (1.727 m)    Weight:   Wt Readings from Last 1 Encounters:  10/29/23 105.2 kg    Ideal Body Weight:  70 kg  BMI:  Body mass index is 35.26 kg/m.  Estimated Nutritional Needs:   Kcal:  2000-2200  Protein:  115-130 grams  Fluid:  >2 L/day  Cammy Copa., RD, LDN, CNSC See AMiON for contact information

## 2023-10-29 NOTE — Procedures (Signed)
 Patient Name: Garrett [name]  MRN: [id number]  Epilepsy Attending: Charlsie Quest  Referring Physician/Provider: Elmer Picker, NP  Date: 10/28/2023 Duration: 22.07 mins  Patient history:  48 y.o. male with history of HTN admitted for ICH. EEG to evaluate for seizure  Level of alertness: Awake, asleep  AEDs during EEG study: propofol  Technical aspects: This EEG study was done with scalp electrodes positioned according to the 10-20 International system of electrode placement. Electrical activity was reviewed with band pass filter of 1-70Hz , sensitivity of 7 uV/mm, display speed of 20mm/sec with a 60Hz  notched filter applied as appropriate. EEG data were recorded continuously and digitally stored.  Video monitoring was available and reviewed as appropriate.  Description: The posterior dominant rhythm consists of 8-9 Hz activity of moderate voltage (25-35 uV) seen predominantly in posterior head regions, symmetric and reactive to eye opening and eye closing. Sleep was characterized by vertex waves, sleep spindles (12 to 14 Hz), maximal frontocentral region. EEG showed intermittent generalized 3 to 6 Hz theta-delta slowing. Physiologic photic driving was not seen during photic stimulation.  Hyperventilation was not performed.     ABNORMALITY - Intermittent slow, generalized  IMPRESSION: This study is suggestive of mild diffuse encephalopathy. No seizures or epileptiform discharges were seen throughout the recording.  Jerime Arif Annabelle Harman

## 2023-10-30 DIAGNOSIS — Z7189 Other specified counseling: Secondary | ICD-10-CM | POA: Diagnosis not present

## 2023-10-30 DIAGNOSIS — N179 Acute kidney failure, unspecified: Secondary | ICD-10-CM

## 2023-10-30 DIAGNOSIS — I613 Nontraumatic intracerebral hemorrhage in brain stem: Secondary | ICD-10-CM | POA: Diagnosis not present

## 2023-10-30 DIAGNOSIS — Z515 Encounter for palliative care: Secondary | ICD-10-CM | POA: Diagnosis not present

## 2023-10-30 LAB — CBC
HCT: 37.4 % — ABNORMAL LOW (ref 39.0–52.0)
Hemoglobin: 12.1 g/dL — ABNORMAL LOW (ref 13.0–17.0)
MCH: 30.4 pg (ref 26.0–34.0)
MCHC: 32.4 g/dL (ref 30.0–36.0)
MCV: 94 fL (ref 80.0–100.0)
Platelets: 335 10*3/uL (ref 150–400)
RBC: 3.98 MIL/uL — ABNORMAL LOW (ref 4.22–5.81)
RDW: 13.7 % (ref 11.5–15.5)
WBC: 8.5 10*3/uL (ref 4.0–10.5)
nRBC: 0 % (ref 0.0–0.2)

## 2023-10-30 LAB — BASIC METABOLIC PANEL
Anion gap: 7 (ref 5–15)
BUN: 11 mg/dL (ref 6–20)
CO2: 22 mmol/L (ref 22–32)
Calcium: 8.3 mg/dL — ABNORMAL LOW (ref 8.9–10.3)
Chloride: 118 mmol/L — ABNORMAL HIGH (ref 98–111)
Creatinine, Ser: 1.13 mg/dL (ref 0.61–1.24)
GFR, Estimated: >60 mL/min (ref >60)
Glucose, Bld: 133 mg/dL — ABNORMAL HIGH (ref 70–99)
Potassium: 3.3 mmol/L — ABNORMAL LOW (ref 3.5–5.1)
Sodium: 147 mmol/L — ABNORMAL HIGH (ref 135–145)

## 2023-10-30 LAB — GLUCOSE, CAPILLARY
Glucose-Capillary: 103 mg/dL — ABNORMAL HIGH (ref 70–99)
Glucose-Capillary: 106 mg/dL — ABNORMAL HIGH (ref 70–99)
Glucose-Capillary: 118 mg/dL — ABNORMAL HIGH (ref 70–99)
Glucose-Capillary: 120 mg/dL — ABNORMAL HIGH (ref 70–99)
Glucose-Capillary: 120 mg/dL — ABNORMAL HIGH (ref 70–99)
Glucose-Capillary: 122 mg/dL — ABNORMAL HIGH (ref 70–99)

## 2023-10-30 LAB — SODIUM
Sodium: 146 mmol/L — ABNORMAL HIGH (ref 135–145)
Sodium: 150 mmol/L — ABNORMAL HIGH (ref 135–145)
Sodium: 151 mmol/L — ABNORMAL HIGH (ref 135–145)
Sodium: 152 mmol/L — ABNORMAL HIGH (ref 135–145)

## 2023-10-30 MED ORDER — POTASSIUM CHLORIDE 20 MEQ PO PACK
40.0000 meq | PACK | Freq: Two times a day (BID) | ORAL | Status: AC
  Administered 2023-10-30 (×2): 40 meq
  Filled 2023-10-30 (×2): qty 2

## 2023-10-30 MED ORDER — POLYETHYLENE GLYCOL 3350 17 G PO PACK
17.0000 g | PACK | Freq: Every day | ORAL | Status: DC
  Administered 2023-10-30 – 2023-10-31 (×2): 17 g
  Filled 2023-10-30 (×2): qty 1

## 2023-10-30 MED ORDER — ENOXAPARIN SODIUM 60 MG/0.6ML IJ SOSY
50.0000 mg | PREFILLED_SYRINGE | INTRAMUSCULAR | Status: DC
  Administered 2023-10-30 – 2023-11-09 (×11): 50 mg via SUBCUTANEOUS
  Filled 2023-10-30 (×12): qty 0.6

## 2023-10-30 MED ORDER — BISACODYL 10 MG RE SUPP
10.0000 mg | Freq: Every day | RECTAL | Status: DC | PRN
  Administered 2023-12-29: 10 mg via RECTAL
  Filled 2023-10-30 (×2): qty 1

## 2023-10-30 MED ORDER — LOSARTAN POTASSIUM 50 MG PO TABS
50.0000 mg | ORAL_TABLET | Freq: Every day | ORAL | Status: DC
  Administered 2023-10-30: 50 mg
  Filled 2023-10-30: qty 1

## 2023-10-30 NOTE — Plan of Care (Signed)
  Problem: Intracerebral Hemorrhage Tissue Perfusion: Goal: Complications of Intracerebral Hemorrhage will be minimized Outcome: Progressing   Problem: Health Behavior/Discharge Planning: Goal: Goals will be collaboratively established with patient/family Outcome: Progressing   Problem: Nutrition: Goal: Risk of aspiration will decrease Outcome: Progressing Goal: Dietary intake will improve Outcome: Progressing

## 2023-10-30 NOTE — Consult Note (Signed)
 Palliative Care Consult Note                                  Date: 10/30/2023   Patient Name: Garrett [name]  DOB: 05/30/1976  MRN: [id number]  Age / Sex: 48 y.o., male  PCP: Pcp, No Referring Physician: Stroke, Md, MD  Reason for Consultation: Establishing goals of care  HPI/Patient Profile: 48 y.o. male  with past medical history of hypertension who presented to the ED on 10/27/2023 after he was found down at home by his fiance. He was found to have an acute large hemorrhage centered in the pons with intraventricular extension in the fourth ventricle and also extension into the basal cisterns. Patient was intubated in the ED.   Palliative Medicine has been consulted for goals of care in the setting of "large ICH".   Subjective:   Extensive chart review has been completed including labs, vital signs, imaging, progress/consult notes, orders, medications and available advance directive documents.   I met with family in the 4N consult room to discuss diagnosis, prognosis, and GOC. Present are his significant other/Garrett [name], mother/Garrett [name], and father/Garrett [name].   I introduced Palliative Medicine as specialized medical care for people with serious illness.   Created space and opportunity for family to express thoughts and feelings regarding current medical situation. Values and goals of care were attempted to be elicited.  Life Review: Family describes patient as "happy" and a "joker". He and Garrett [name] have been together for 15 years. He has 2 daughters, ages 81 and 75. He also has a young grandchild, and another one on the way (due this summer).   GOC Discussion: We reviewed patient's hospital course and current clinical condition.  Discussed that patient had a large ICH, which has caused devastating neurologic injury.   Family understands that patient's prognosis is quite grim given concern for brainstem edema.   Encouraged family to consider  what medical interventions patient would or would not want, keeping in mind the concept of quality of life.    Discussed code status. I encouraged consideration of DNR status and provided education on evidence-based poor outcomes in similar hospitalized patients.  Discussed that if the goal is to continue full scope care, then patient will ultimately need a tracheostomy to avoid complications of prolonged endotracheal intubation. Family is very clear that patient would not want a trach and PEG.   Family shares that they need time to process the situation before making any major decisions. They share that patient's 72 year-old daughter is especially having a difficult time with the situation.   Family has a strong faith in God and they want to allow time for the possibility of a miracle. We did discuss that the generally accepted time limit for an ET tube is 10-14 days.   Discussed the importance of continued conversation with family and the medical team regarding overall plan of care.    Review of Systems  Unable to perform ROS   Objective:   Primary Diagnoses: Present on Admission:  ICH (intracerebral hemorrhage) (HCC)  CT HEAD w/o CONTRAST 10/27/23 IMPRESSION: 1. 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 Basal cisterns and fourth ventricle are effaced without hydrocephalus at this time. 2. Trace additional subarachnoid hemorrhage along the left parietal convexity.   Physical Exam Vitals reviewed.  Constitutional:  General: He is not in acute distress.    Comments: Critically ill-appearing  Cardiovascular:     Rate and Rhythm: Normal rate.  Pulmonary:     Comments: Intubated Neurological:     Comments: Not following commands     Palliative Assessment/Data: PPS 30%     Assessment & Plan:   SUMMARY OF RECOMMENDATIONS   Continue full scope  interventions Allow time for outcomes - family needs time to process before making major decisions Family is clear that patient would not want a trach and PEG Ongoing palliative support  Primary Decision Maker: Per [place] hierarchy, next of kin is patient's 2 daughters and his parents  Code Status/Advance Care Planning: Full code  Symptom Management:  Per attending  Discharge Planning:  To Be Determined    Thank you for allowing [place] to participate in the care of Garrett [name]   Time Total: 80 minutes  Detailed review of medical records (labs, imaging, vital signs), medically appropriate exam, discussed with treatment team, counseling and education to patient, family, & staff, documenting clinical information, medication management, coordination of care.  Signed by: Sherlean Foot, NP Palliative Medicine Team  Team Phone # [phone]  For individual providers, please see AMION

## 2023-10-30 NOTE — Progress Notes (Signed)
 SLP Cancellation Note  Patient Details Name: Garrett [name] MRN: [id number] DOB: Apr 15, 1976   Cancelled treatment:       Reason Eval/Treat Not Completed: Patient not medically ready Patient continues to be on vent. SLP will follow for readiness.  Angela Nevin, MA, CCC-SLP Speech Therapy

## 2023-10-30 NOTE — Progress Notes (Signed)
 SLP Cancellation Note  Patient Details Name: Garrett [name] MRN: [id number] DOB: Nov 06, 1975   Cancelled treatment:       Reason Eval/Treat Not Completed: Patient not medically ready (on vent). SLP will continue following.    Gwynneth Aliment, M.A., CF-SLP Speech Language Pathology, Acute Rehabilitation Services  Secure Chat preferred [phone]  10/30/2023, 9:32 AM

## 2023-10-30 NOTE — Progress Notes (Addendum)
 STROKE TEAM PROGRESS NOTE   INTERIM HISTORY/SUBJECTIVE Patient has remained hemodynamically stable and afebrile.  Neurological exam remains poor.  Sodium has increased to goal at 151. His fiance is at the bedside.  I also spoke to the mother over the phone. Neurological exam remains quite poor with trace lower extremity extensor response to noxious stimuli.  Absent corneals and pupillary and weak cough and gag reflexes OBJECTIVE  CBC    Component Value Date/Time   WBC 8.5 10/30/2023 0621   RBC 3.98 (L) 10/30/2023 0621   HGB 12.1 (L) 10/30/2023 0621   HCT 37.4 (L) 10/30/2023 0621   PLT 335 10/30/2023 0621   MCV 94.0 10/30/2023 0621   MCH 30.4 10/30/2023 0621   MCHC 32.4 10/30/2023 0621   RDW 13.7 10/30/2023 0621   LYMPHSABS 1.3 10/27/2023 1826   MONOABS 1.4 (H) 10/27/2023 1826   EOSABS 0.1 10/27/2023 1826   BASOSABS 0.1 10/27/2023 1826    BMET    Component Value Date/Time   NA 151 (H) 10/30/2023 1029   K 3.3 (L) 10/30/2023 0621   CL 118 (H) 10/30/2023 0621   CO2 22 10/30/2023 0621   GLUCOSE 133 (H) 10/30/2023 0621   BUN 11 10/30/2023 0621   CREATININE 1.13 10/30/2023 0621   CALCIUM 8.3 (L) 10/30/2023 0621   GFRNONAA >60 10/30/2023 0621    IMAGING past 24 hours ECHOCARDIOGRAM COMPLETE Result Date: 10/29/2023    ECHOCARDIOGRAM REPORT   Patient Name:   Garrett [name] Date of Exam: 10/29/2023 Medical Rec #:  [id number]          Height:       68.0 in Accession #:    [id number]         Weight:       231.9 lb Date of Birth:  [birth year]         BSA:          2.177 m Patient Age:    47 years           BP:           137/87 mmHg Patient Gender: M                  HR:           96 bpm. Exam Location:  Inpatient Procedure: 2D Echo, Cardiac Doppler, Color Doppler and Strain Analysis (Both            Spectral and Color Flow Doppler were utilized during procedure). Indications:    Stroke I63.9  History:        Patient has no prior history of Echocardiogram examinations.  Sonographer:     Lucendia Herrlich RCS Referring Phys: [phone] ERIC LINDZEN IMPRESSIONS  1. Left ventricular ejection fraction, by estimation, is 60 to 65%. The left ventricle has normal function. The left ventricle has no regional wall motion abnormalities. There is moderate concentric left ventricular hypertrophy. Left ventricular diastolic parameters are consistent with Grade I diastolic dysfunction (impaired relaxation). The average left ventricular global longitudinal strain is -13.3 %. The global longitudinal strain is abnormal.  2. Right ventricular systolic function is normal. The right ventricular size is normal. Tricuspid regurgitation signal is inadequate for assessing PA pressure.  3. The mitral valve is normal in structure. No evidence of mitral valve regurgitation. No evidence of mitral stenosis.  4. The aortic valve is tricuspid. Aortic valve regurgitation is not visualized. No aortic stenosis is present.  5. The inferior vena cava is normal in size with <  50% respiratory variability, suggesting right atrial pressure of 8 mmHg. FINDINGS  Left Ventricle: Left ventricular ejection fraction, by estimation, is 60 to 65%. The left ventricle has normal function. The left ventricle has no regional wall motion abnormalities. The average left ventricular global longitudinal strain is -13.3 %. Strain was performed and the global longitudinal strain is abnormal. The left ventricular internal cavity size was normal in size. There is moderate concentric left ventricular hypertrophy. Left ventricular diastolic parameters are consistent with Grade I diastolic dysfunction (impaired relaxation). Right Ventricle: The right ventricular size is normal. No increase in right ventricular wall thickness. Right ventricular systolic function is normal. Tricuspid regurgitation signal is inadequate for assessing PA pressure. Left Atrium: Left atrial size was normal in size. Right Atrium: Right atrial size was normal in size. Pericardium: There is no  evidence of pericardial effusion. Mitral Valve: The mitral valve is normal in structure. No evidence of mitral valve regurgitation. No evidence of mitral valve stenosis. Tricuspid Valve: The tricuspid valve is normal in structure. Tricuspid valve regurgitation is not demonstrated. Aortic Valve: The aortic valve is tricuspid. Aortic valve regurgitation is not visualized. No aortic stenosis is present. Aortic valve peak gradient measures 9.4 mmHg. Pulmonic Valve: The pulmonic valve was normal in structure. Pulmonic valve regurgitation is not visualized. Aorta: The aortic root is normal in size and structure. Venous: The inferior vena cava is normal in size with less than 50% respiratory variability, suggesting right atrial pressure of 8 mmHg. IAS/Shunts: No atrial level shunt detected by color flow Doppler.  LEFT VENTRICLE PLAX 2D LVIDd:         4.70 cm   Diastology LVIDs:         3.00 cm   LV e' medial:    6.42 cm/s LV PW:         1.20 cm   LV E/e' medial:  12.0 LV IVS:        1.20 cm   LV e' lateral:   5.44 cm/s LVOT diam:     2.40 cm   LV E/e' lateral: 14.2 LV SV:         87 LV SV Index:   40        2D Longitudinal Strain LVOT Area:     4.52 cm  2D Strain GLS Avg:     -13.3 %  RIGHT VENTRICLE             IVC RV S prime:     17.20 cm/s  IVC diam: 1.50 cm TAPSE (M-mode): 1.8 cm LEFT ATRIUM             Index        RIGHT ATRIUM           Index LA diam:        3.60 cm 1.65 cm/m   RA Area:     15.00 cm LA Vol (A2C):   32.6 ml 14.98 ml/m  RA Volume:   35.50 ml  16.31 ml/m LA Vol (A4C):   63.2 ml 29.04 ml/m LA Biplane Vol: 45.8 ml 21.04 ml/m  AORTIC VALVE AV Area (Vmax): 3.52 cm AV Vmax:        153.00 cm/s AV Peak Grad:   9.4 mmHg LVOT Vmax:      119.00 cm/s LVOT Vmean:     77.400 cm/s LVOT VTI:       0.192 m  AORTA Ao Root diam: 3.60 cm Ao Asc diam:  3.70 cm MITRAL VALVE MV Area (  PHT): 4.80 cm    SHUNTS MV Decel Time: 158 msec    Systemic VTI:  0.19 m MV E velocity: 77.00 cm/s  Systemic Diam: 2.40 cm MV A  velocity: 90.30 cm/s MV E/A ratio:  0.85 Dalton McleanMD Electronically signed by Wilfred Lacy Signature Date/Time: 10/29/2023/4:35:42 PM    Final     Vitals:   10/30/23 1130 10/30/23 1145 10/30/23 1200 10/30/23 1215  BP: (!) 142/90 (!) 143/91 (!) 145/91 (!) 149/99  Pulse: 76 74 71 77  Resp: (!) 25 (!) 25 (!) 25 (!) 26  Temp: 98.4 F (36.9 C) 98.4 F (36.9 C) 98.2 F (36.8 C) 98.2 F (36.8 C)  TempSrc:      SpO2: 96% 95% 97% 98%  Weight:      Height:         PHYSICAL EXAM General: Unresponsive African-American male Psych: Unresponsive CV: Regular rate and rhythm on monitor Respiratory: Mechanically ventilated, respirations synchronous with ventilator   NEURO:  Mental Status: Unresponsive, intubated, sedation paused  Cranial Nerves:  II: Pupils pinpoint, nonreactive, corneal absent bilaterally III, IV, VI: Eyes midline,  absent oculocephalic reflex  VII: Face is symmetrical resting with ET tube present IX, X: Cough intact, gag absent XI: Head is midline XII: ETT in place Motor/Sensory: No purposeful movement.  Trace extensor posturing in lower extremities to noxious  Sensation-appears intact to noxious in lower extremities Coordination: Deferred Gait- deferred  Most Recent NIH 18     ASSESSMENT/PLAN  [name] is a 48 y.o. male with history of HTN admitted for ICH.    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 Basal cisterns and fourth ventricle are effaced without hydrocephalus at this time. Trace additional subarachnoid hemorrhage along the left parietal convexity. CTA head & neck no LVO 2D Echo EF 60 to 65%, moderate  concentric LVH, grade 1 diastolic dysfunction, normal left atrial size, no atrial level shunt LDL pending HgbA1c 4.7 VTE prophylaxis - SCDs No antithrombotic prior to admission, now on No antithrombotic secondary to ICH Therapy recommendations:  Pending Disposition:  pending, palliative care consulted to discuss goals of care with family  Cerebral edema (brain compression) Neuro exam remains poor, given concern for edema in the brainstem and surrounding structures Will start 3% hypertonic saline with 250 cc bolus and rate of 75 cc/h Measure sodium every 6 hours, goal 150-155  Hypertension Home meds:  None Unstable Currently requiring cleviprex PRN labetalol  Blood Pressure Goal: SBP between 130-150 for 24 hours and then less than 160   Hypoxic respiratory failure Aspiration PNA Intubated in the ED PCCM following SVT/VAP per CCM CXR- Mild patchy medial right upper lobe and right perihilar airspace opacities. Lactic- 5.8 WBC 20.4 ABX: Unasyn   Dysphagia OG in place    Diet   Diet NPO time specified    Substance use UDS positive for opioids Narcan given in ED  Other Active Problems Hypokalemia K2.6 -> replaced -> 4.2-> 3.3-> repleted AKI Cr 2.2 GFR 36  Hospital day # 3  Patient seen and examined by NP/APP with MD. MD to update note as needed.   Cortney E Ernestina Columbia , MSN, AGACNP-BC [facility] See Amion for schedule and pager information 10/30/2023 12:38  PM  I have personally obtained history,examined this patient, reviewed notes, independently viewed imaging studies, participated in medical decision making and plan of care.ROS completed by me personally and pertinent positives fully documented  I have made any additions or clarifications directly to the above note. Agree with note above.  Patient's exam remains quite poor with minimum brainstem reflexes preserved.  Long discussion with mother over the phone and fianc at the bedside they understand  the poor prognosis but has faith and would like to continue support.  Await palliative care consultation and discussion with family.  Continue strict blood pressure control systolic goal below 160 and hypertonic saline with serum sodium goal 150-155.  Ventilatory support as per critical care team discussed with Dr. Isaiah Serge critical care medicine This patient is critically ill and at significant risk of neurological worsening, death and care requires constant monitoring of vital signs, hemodynamics,respiratory and cardiac monitoring, extensive review of multiple databases, frequent neurological assessment, discussion with family, other specialists and medical decision making of high complexity.I have made any additions or clarifications directly to the above note.This critical care time does not reflect procedure time, or teaching time or supervisory time of PA/NP/Med Resident etc but could involve care discussion time.  I spent 30 minutes of neurocritical care time  in the care of  this patient.     Delia Heady, MD Medical Director [facility] Pager: [phone] 10/30/2023 3:38 PM  To contact Stroke Continuity provider, please refer to [email or web address]. After hours, contact General Neurology

## 2023-10-30 NOTE — Progress Notes (Signed)
 NAME:  Garrett [name], MRN:  [id number], DOB:  1975-09-06, LOS: 3 ADMISSION DATE:  10/27/2023, CONSULTATION DATE:  10/27/2023 REFERRING MD:  Coralee Pesa, DO CHIEF COMPLAINT:  Found Down  History of Present Illness:   48 y/o male with h/o HTN who was found down for unknown downtime by his [place] when she came home from work, he was having agonal breathing.  He apparently had driven her to work this morning.  He has HTN but never checks his BP and does not follow with MD.  She says you can tell when his BP is really high; his eyes get blood shot and he is sweating.  No h/o seizures.  Besides almost daily [name] and cigarette smoking, she is not aware of any other drugs. Drug screen positive for opioids and he was given several doses of Narcan. Patient found to have:  1. 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 Basal cisterns and fourth ventricle are effaced without hydrocephalus at this time. 2. Trace additional subarachnoid hemorrhage along the left parietal convexity. BP in ED 262/156. His cxr revealed a RUL and perihilar infiltrates suggesting aspiration pneumonia. ED labs also revealed PH 7.169, LA 4.4, CPK 985. Patient was intubated in ED.  Pertinent  Medical History  HTN  Significant Hospital Events: Including procedures, antibiotic start and stop dates in addition to other pertinent events   3/8: Intubated in ED, pontine bleed/SAH. CT head 17:09 Acute large hemorrhage 4.1 cm in pons with intraventricular extension without hydrocephalus 3/9: CTA 03:14 AM No change in IPH in pons, similar biparietal SAH  Interim History / Subjective:   Remains on the ventilator and Cleviprex drip. Started 3% saline Mental status is poor  Objective   Blood pressure (!) 146/91, pulse 74, temperature 98.4 F (36.9 C), resp. rate (!) 25, height 5' 7.99" (1.727 m), weight 105.9  kg, SpO2 98%.    Vent Mode: AC;PRVC FiO2 (%):  [30 %-40 %] 30 % Set Rate:  [25 bmp] 25 bmp Vt Set:  [550 mL] 550 mL PEEP:  [5 cmH20-8 cmH20] 5 cmH20 Plateau Pressure:  [21 cmH20-23 cmH20] 23 cmH20   Intake/Output Summary (Last 24 hours) at 10/30/2023 0827 Last data filed at 10/30/2023 0800 Gross per 24 hour  Intake 4159.71 ml  Output 2250 ml  Net 1909.71 ml   Filed Weights   10/28/23 0500 10/29/23 0500 10/30/23 0500  Weight: 103 kg 105.2 kg 105.9 kg   Physical Exam: Blood pressure (!) 146/91, pulse 74, temperature 98.4 F (36.9 C), resp. rate (!) 25, height 5' 7.99" (1.727 m), weight 105.9 kg, SpO2 98%. Gen:      No acute distress HEENT:  EOMI, sclera anicteric Neck:     No masses; no thyromegaly, ETT Lungs:    Clear to auscultation bilaterally; normal respiratory effort CV:         Regular rate and rhythm; no murmurs Abd:      + bowel sounds; soft, non-tender; no palpable masses, no distension Ext:    No edema; adequate peripheral perfusion Skin:      Warm and dry; no rash Neuro: Sedated, unresponsive  Lab/imaging reviewed Significant for sodium 147, BUN/creatinine 11/1.13 WBC 8.5, hemoglobin 4.1, platelets 235 No new imaging  Resolved Hospital Problem list   Lactic acidosis  Assessment & Plan:  Pontine Hemorrhagic stroke with trace [facility] -Neurology primary. Management and imaging per team -Neuroprotective measures -Neuro checks -SBP  goal 130-150   Hypertensive Emergency -Cleviprex for goal SBP 130-150 On Norvasc 10 mg  Acute Respiratory Failure, hypoxic and hypercapnic -Full vent support No plans on weaning at present due to mental status Continue DuoNebs, follow intermittent chest x-ray  Aspiration Pneumonia: CXR with right opacities -Unasyn  AKI -Acute on chronic likely secondary to poorly treated h/o HTN -Replace electrolytes as needed  Best Practice (right click and "Reselect all SmartList Selections" daily)   Diet/type: tubefeeds DVT prophylaxis  SCD Pressure ulcer(s): N/A GI prophylaxis: H2B Lines: N/A Foley:  N/A Code Status:  full code  Critical care time:    The patient is critically ill with multiple organ system failure and requires high complexity decision making for assessment and support, frequent evaluation and titration of therapies, advanced monitoring, review of radiographic studies and interpretation of complex data.   Critical Care Time devoted to patient care services, exclusive of separately billable procedures, described in this note is 35 minutes.   Chilton Greathouse MD [facility] See Amion for pager  If no response to pager , please call [phone] until 7pm After 7:00 pm call Elink  [phone] 10/30/2023, 8:27 AM

## 2023-10-30 NOTE — Plan of Care (Signed)
  Problem: Education: Goal: Knowledge of disease or condition will improve Outcome: Progressing Goal: Knowledge of secondary prevention will improve (MUST DOCUMENT ALL) Outcome: Progressing   Problem: Intracerebral Hemorrhage Tissue Perfusion: Goal: Complications of Intracerebral Hemorrhage will be minimized Outcome: Progressing   Problem: Health Behavior/Discharge Planning: Goal: Goals will be collaboratively established with patient/family Outcome: Progressing   Problem: Nutrition: Goal: Risk of aspiration will decrease Outcome: Progressing Goal: Dietary intake will improve Outcome: Progressing   Problem: Fluid Volume: Goal: Ability to maintain a balanced intake and output will improve Outcome: Progressing   Problem: Metabolic: Goal: Ability to maintain appropriate glucose levels will improve Outcome: Progressing   Problem: Activity: Goal: Risk for activity intolerance will decrease Outcome: Progressing   Problem: Nutrition: Goal: Adequate nutrition will be maintained Outcome: Progressing   Problem: Pain Managment: Goal: General experience of comfort will improve and/or be controlled Outcome: Progressing   Problem: Safety: Goal: Ability to remain free from injury will improve Outcome: Progressing   Problem: Skin Integrity: Goal: Risk for impaired skin integrity will decrease Outcome: Progressing

## 2023-10-30 NOTE — TOC CM/SW Note (Signed)
 Transition of Care [facility]) - Inpatient Brief Assessment   Patient Details  Name: Brain Honeycutt MRN: [id number] Date of Birth: 1976-02-04  [facility]) CM/SW Contact:    Mearl Latin, LCSW Phone Number: 10/30/2023, 9:31 AM   Clinical Narrative: Patient admitted from home and is currently intubated. Patient without insurance and no PCP listed. TOC will continue to follow.   Transition of Care Asessment: Insurance and Status: Selfpay Patient has primary care physician: No Home environment has been reviewed: From home Prior level of function:: Independent Prior/Current Home Services: No current home services Social Drivers of Health Review: SDOH reviewed no interventions necessary Readmission risk has been reviewed: Yes Transition of care needs: no transition of care needs at this time

## 2023-10-30 NOTE — Progress Notes (Signed)
 Pharmacy Antibiotic Note  Garrett [name] is a 48 y.o. male admitted on 10/27/2023 with pneumonia.  Pharmacy has been consulted for Unasyn dosing.  WBC 16>20, afebrile, LA 1.5 Scr 1.13 3/8 CXR: Mild patchy medial right upper lobe and right perihilar airspace opacities  Plan: Continue Unasyn 3g IV every 6 hours Continue for a total 7 days of treatment per CCM Follow cultures, CBC, and signs of clinical improvement  Renal function stable, will sign off and monitor peripherally  Height: 5' 7.99" (172.7 cm) Weight: 105.9 kg (233 lb 7.5 oz) IBW/kg (Calculated) : 68.38  Temp (24hrs), Avg:98.7 F (37.1 C), Min:97.7 F (36.5 C), Max:99.7 F (37.6 C)  Recent Labs  Lab 10/27/23 1826 10/27/23 1831 10/27/23 2044 10/27/23 2216 10/28/23 0934 10/29/23 1428 10/29/23 1635 10/30/23 0621  WBC 16.5*  --   --   --  20.4*  --   --  8.5  CREATININE 1.86* 2.50*  --   --  2.20*  --  1.36* 1.13  LATICACIDVEN  --  4.4* 6.2* 5.8*  --  1.5  --   --     Estimated Creatinine Clearance: 95.3 mL/min (by C-G formula based on SCr of 1.13 mg/dL).    Allergies  Allergen Reactions   Tomato Hives, Itching and Rash    Antimicrobials this admission: 3/8 Cefepime x1  3/8 Vancomycin x1  3/9 Unasyn >> [3/15]  Microbiology results: 3/8 BCx: no growth 3 days 3/8 MRSA PCR: positive  Thank you for allowing pharmacy to be a part of this patient's care.  Stephenie Acres, PharmD PGY1 Pharmacy Resident 10/30/2023 10:54 AM

## 2023-10-31 ENCOUNTER — Encounter (HOSPITAL_COMMUNITY): Payer: Self-pay

## 2023-10-31 ENCOUNTER — Other Ambulatory Visit: Payer: Self-pay

## 2023-10-31 DIAGNOSIS — I161 Hypertensive emergency: Secondary | ICD-10-CM | POA: Diagnosis not present

## 2023-10-31 DIAGNOSIS — N179 Acute kidney failure, unspecified: Secondary | ICD-10-CM | POA: Diagnosis not present

## 2023-10-31 DIAGNOSIS — I613 Nontraumatic intracerebral hemorrhage in brain stem: Secondary | ICD-10-CM | POA: Diagnosis not present

## 2023-10-31 LAB — BASIC METABOLIC PANEL
Anion gap: 9 (ref 5–15)
BUN: 11 mg/dL (ref 6–20)
CO2: 22 mmol/L (ref 22–32)
Calcium: 8.7 mg/dL — ABNORMAL LOW (ref 8.9–10.3)
Chloride: 124 mmol/L — ABNORMAL HIGH (ref 98–111)
Creatinine, Ser: 1.09 mg/dL (ref 0.61–1.24)
GFR, Estimated: >60 mL/min (ref >60)
Glucose, Bld: 123 mg/dL — ABNORMAL HIGH (ref 70–99)
Potassium: 4.2 mmol/L (ref 3.5–5.1)
Sodium: 155 mmol/L — ABNORMAL HIGH (ref 135–145)

## 2023-10-31 LAB — CBC
HCT: 38.3 % — ABNORMAL LOW (ref 39.0–52.0)
Hemoglobin: 12.2 g/dL — ABNORMAL LOW (ref 13.0–17.0)
MCH: 30.9 pg (ref 26.0–34.0)
MCHC: 31.9 g/dL (ref 30.0–36.0)
MCV: 97 fL (ref 80.0–100.0)
Platelets: 354 10*3/uL (ref 150–400)
RBC: 3.95 MIL/uL — ABNORMAL LOW (ref 4.22–5.81)
RDW: 14.3 % (ref 11.5–15.5)
WBC: 11.3 10*3/uL — ABNORMAL HIGH (ref 4.0–10.5)
nRBC: 0 % (ref 0.0–0.2)

## 2023-10-31 LAB — GLUCOSE, CAPILLARY
Glucose-Capillary: 110 mg/dL — ABNORMAL HIGH (ref 70–99)
Glucose-Capillary: 110 mg/dL — ABNORMAL HIGH (ref 70–99)
Glucose-Capillary: 111 mg/dL — ABNORMAL HIGH (ref 70–99)
Glucose-Capillary: 117 mg/dL — ABNORMAL HIGH (ref 70–99)
Glucose-Capillary: 118 mg/dL — ABNORMAL HIGH (ref 70–99)
Glucose-Capillary: 124 mg/dL — ABNORMAL HIGH (ref 70–99)

## 2023-10-31 LAB — LIPID PANEL
Cholesterol: 163 mg/dL (ref 0–200)
HDL: 37 mg/dL — ABNORMAL LOW (ref >40)
LDL Cholesterol: 96 mg/dL (ref 0–99)
Total CHOL/HDL Ratio: 4.4 ratio
Triglycerides: 149 mg/dL (ref <150)
VLDL: 30 mg/dL (ref 0–40)

## 2023-10-31 LAB — SODIUM
Sodium: 156 mmol/L — ABNORMAL HIGH (ref 135–145)
Sodium: 154 mmol/L — ABNORMAL HIGH (ref 135–145)
Sodium: 155 mmol/L — ABNORMAL HIGH (ref 135–145)

## 2023-10-31 MED ORDER — CLEVIDIPINE BUTYRATE 0.5 MG/ML IV EMUL
INTRAVENOUS | Status: AC
  Filled 2023-10-31: qty 50

## 2023-10-31 MED ORDER — FAMOTIDINE 20 MG PO TABS
20.0000 mg | ORAL_TABLET | Freq: Two times a day (BID) | ORAL | Status: DC
  Administered 2023-10-31 – 2024-02-25 (×228): 20 mg
  Filled 2023-10-31 (×236): qty 1

## 2023-10-31 MED ORDER — HYDRALAZINE HCL 50 MG PO TABS
50.0000 mg | ORAL_TABLET | Freq: Three times a day (TID) | ORAL | Status: DC
  Administered 2023-10-31 – 2023-11-01 (×3): 50 mg
  Filled 2023-10-31 (×3): qty 1

## 2023-10-31 MED ORDER — LOSARTAN POTASSIUM 50 MG PO TABS
100.0000 mg | ORAL_TABLET | Freq: Every day | ORAL | Status: DC
  Administered 2023-10-31 – 2023-11-03 (×4): 100 mg
  Filled 2023-10-31 (×4): qty 2

## 2023-10-31 NOTE — Progress Notes (Addendum)
 STROKE TEAM PROGRESS NOTE   INTERIM HISTORY/SUBJECTIVE Patient has remained hemodynamically stable and afebrile.  Neurological exam remains poor.   His fiance is at the bedside and we spoke to the mother over the phone. Neurological exam remains quite poor with trace lower extremity extensor response to noxious stimuli.  Absent corneals and pupillary and weak cough and gag reflexes Now he has some ocular bobbing with the right eye more than left eye  Schedule another family meeting with palliative and CCM prior to trach/PEG  OBJECTIVE  CBC    Component Value Date/Time   WBC 11.3 (H) 10/31/2023 0513   RBC 3.95 (L) 10/31/2023 0513   HGB 12.2 (L) 10/31/2023 0513   HCT 38.3 (L) 10/31/2023 0513   PLT 354 10/31/2023 0513   MCV 97.0 10/31/2023 0513   MCH 30.9 10/31/2023 0513   MCHC 31.9 10/31/2023 0513   RDW 14.3 10/31/2023 0513   LYMPHSABS 1.3 10/27/2023 1826   MONOABS 1.4 (H) 10/27/2023 1826   EOSABS 0.1 10/27/2023 1826   BASOSABS 0.1 10/27/2023 1826    BMET    Component Value Date/Time   NA 155 (H) 10/31/2023 0513   K 4.2 10/31/2023 0513   CL 124 (H) 10/31/2023 0513   CO2 22 10/31/2023 0513   GLUCOSE 123 (H) 10/31/2023 0513   BUN 11 10/31/2023 0513   CREATININE 1.09 10/31/2023 0513   CALCIUM 8.7 (L) 10/31/2023 0513   GFRNONAA >60 10/31/2023 0513    IMAGING past 24 hours No results found.   Vitals:   10/31/23 0715 10/31/23 0730 10/31/23 0751 10/31/23 0800  BP: (!) 142/87 (!) 143/88 138/87 (!) 145/91  Pulse: 84 85 89 89  Resp: (!) 25 (!) 25 20 (!) 29  Temp: 98.8 F (37.1 C) 98.8 F (37.1 C)  99 F (37.2 C)  TempSrc:      SpO2: 100% 100% 99% 100%  Weight:      Height:         PHYSICAL EXAM General: Unresponsive African-American male Psych: Unresponsive CV: Regular rate and rhythm on monitor Respiratory: Mechanically ventilated, respirations synchronous with ventilator   NEURO:  Mental Status: Unresponsive, intubated, sedation paused  Cranial Nerves:   II: Pupils pinpoint, nonreactive, corneal absent bilaterally III, IV, VI: Eyes midline,  absent oculocephalic reflex. Ocular bobbing right eye greater than left VII: Face is symmetrical resting with ET tube present IX, X: Cough intact, gag absent XI: Head is midline XII: ETT in place Motor/Sensory: No purposeful movement.  Trace extensor posturing in lower extremities to noxious Sensation-appears intact to noxious in lower extremities Coordination: Deferred Gait- deferred  Most Recent NIH 78     ASSESSMENT/PLAN  Mr. Sumit Branham is a 48 y.o. male with history of HTN admitted for ICH.    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 Basal cisterns and fourth ventricle are effaced without hydrocephalus at this time. Trace additional subarachnoid hemorrhage along the left parietal convexity. CTA head & neck no LVO 2D Echo EF 60 to 65%, moderate concentric LVH, grade 1 diastolic dysfunction, normal left atrial size, no atrial level shunt LDL pending HgbA1c 4.7 VTE prophylaxis - SCDs No antithrombotic prior to admission, now on No antithrombotic secondary to ICH Therapy recommendations:  Pending  Disposition:  pending, palliative care consulted to discuss goals of care with family  Cerebral edema (brain compression) Neuro exam remains poor, given concern for edema in the brainstem and surrounding structures 3% hypertonic saline with 250 cc bolus and rate of 75 cc/h -> on hold Measure sodium every 6 hours, goal 150-155 Na 156 -> hypertonic on hold -> continue q6 checks  Hypertension Home meds:  None Unstable Currently requiring cleviprex PRN labetalol  Blood  Pressure Goal: SBP between 130-150 for 24 hours and then less than 160   Hypoxic respiratory failure Aspiration PNA Intubated in the ED PCCM following SVT/VAP per CCM CXR- Mild patchy medial right upper lobe and right perihilar airspace opacities. Lactic- 5.8 WBC 20.4 ABX: Unasyn   Dysphagia OG in place    Diet   Diet NPO time specified    Substance use UDS positive for opioids Narcan given in ED  Other Active Problems Hypokalemia K2.6 -> replaced -> 4.2-> 3.3-> repleted AKI Cr 2.2 GFR 36  Hospital day # 4  Patient seen and examined by NP/APP with MD. MD to update note as needed.   Elmer Picker, DNP, FNP-BC [facility] Pager: [phone]  I have personally obtained history,examined this patient, reviewed notes, independently viewed imaging studies, participated in medical decision making and plan of care.ROS completed by me personally and pertinent positives fully documented  I have made any additions or clarifications directly to the above note. Agree with note above.  Patient neurological exam continues to be quite poor with patient unresponsive and not following commands.  There is now some vertical eye bobbing movements now and continuing extensor posturing in the lower extremities to noxious stimuli.  Long discussion at the bedside with the patient's fianc as well as the spoke to his mother over the phone and relayed.  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  Family understands want to continue aggressive care for now for few days at risk.  I also brought up issue about early tracheostomy and PEG tube and family wants to wait on that for now.  Family met with palliative care team y`day and wer not able to make Decision at this time. This patient is critically ill and at significant risk of neurological worsening, death and care requires constant monitoring of vital signs,  hemodynamics,respiratory and cardiac monitoring, extensive review of multiple databases, frequent neurological assessment, discussion with family, other specialists and medical decision making of high complexity.I have made any additions or clarifications directly to the above note.This critical care time does not reflect procedure time, or teaching time or supervisory time of PA/NP/Med Resident etc but could involve care discussion time.  I spent 30 minutes of neurocritical care time  in the care of  this patient.     Delia Heady, MD Medical Director [facility] Pager: [phone] 10/31/2023 4:01 PM  To contact Stroke Continuity provider, please refer to [email or web address]. After hours, contact General Neurology

## 2023-10-31 NOTE — TOC CM/SW Note (Signed)
 Transition of Care [facility]) - Inpatient Brief Assessment   Patient Details  Name: Garrett [name] MRN: [id number] Date of Birth: 04/20/1976  Transition of Care [facility]) CM/SW Contact:    Mearl Latin, LCSW Phone Number: 10/31/2023, 3:54 PM   Clinical Narrative: 11:30am-CSW received request to speak with patient's fiance regarding placement options if they were to proceed with trach/peg and patient remains on the ventilator. Family not at bedside.   2:35pm-CSW left voicemail for patient's fiance.   Placement options would be limited for a ventilated patient (only three facilities in [place] and one does not accept only Medicaid). Placement would likely be outside of [place].   Transition of Care Asessment: Insurance and Status: Selfpay Patient has primary care physician: No Home environment has been reviewed: From home Prior level of function:: Independent Prior/Current Home Services: No current home services Social Drivers of Health Review: SDOH reviewed no interventions necessary Readmission risk has been reviewed: Yes Transition of care needs: no transition of care needs at this time

## 2023-10-31 NOTE — Plan of Care (Signed)
  Problem: Education: Goal: Knowledge of disease or condition will improve Outcome: Progressing   Problem: Intracerebral Hemorrhage Tissue Perfusion: Goal: Complications of Intracerebral Hemorrhage will be minimized Outcome: Progressing   Problem: Health Behavior/Discharge Planning: Goal: Goals will be collaboratively established with patient/family Outcome: Progressing   Problem: Nutrition: Goal: Risk of aspiration will decrease Outcome: Progressing Goal: Dietary intake will improve Outcome: Progressing   Problem: Nutrition: Goal: Adequate nutrition will be maintained Outcome: Progressing   Problem: Elimination: Goal: Will not experience complications related to urinary retention Outcome: Progressing   Problem: Pain Managment: Goal: General experience of comfort will improve and/or be controlled Outcome: Progressing   Problem: Safety: Goal: Ability to remain free from injury will improve Outcome: Progressing   Problem: Skin Integrity: Goal: Risk for impaired skin integrity will decrease Outcome: Progressing

## 2023-10-31 NOTE — Progress Notes (Signed)
 NAME:  Maddex Garlitz, MRN:  [id number], DOB:  1976-06-20, LOS: 4 ADMISSION DATE:  10/27/2023, CONSULTATION DATE:  10/27/2023 REFERRING MD:  Coralee Pesa, DO CHIEF COMPLAINT:  Found Down  History of Present Illness:   48 y/o male with h/o HTN who was found down for unknown downtime by his [place] when she came home from work, he was having agonal breathing.  He apparently had driven her to work this morning.  He has HTN but never checks his BP and does not follow with MD.  She says you can tell when his BP is really high; his eyes get blood shot and he is sweating.  No h/o seizures.  Besides almost daily [name] and cigarette smoking, she is not aware of any other drugs. Drug screen positive for opioids and he was given several doses of Narcan. Patient found to have:  1. 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 Basal cisterns and fourth ventricle are effaced without hydrocephalus at this time. 2. Trace additional subarachnoid hemorrhage along the left parietal convexity. BP in ED 262/156. His cxr revealed a RUL and perihilar infiltrates suggesting aspiration pneumonia. ED labs also revealed PH 7.169, LA 4.4, CPK 985. Patient was intubated in ED.  Pertinent  Medical History  HTN  Significant Hospital Events: Including procedures, antibiotic start and stop dates in addition to other pertinent events   3/8: Intubated in ED, pontine bleed/SAH. CT head 17:09 Acute large hemorrhage 4.1 cm in pons with intraventricular extension without hydrocephalus 3/9: CTA 03:14 AM No change in IPH in pons, similar biparietal SAH  Interim History / Subjective:   Remains on Cleviprex, propofol, hypertonic saline drips Continues to have poor mental status on the ventilator  Objective   Blood pressure (!) 145/91, pulse 89, temperature 99 F (37.2 C), resp. rate (!) 29, height 5' 7.99"  (1.727 m), weight 105.9 kg, SpO2 100%.    Vent Mode: PRVC FiO2 (%):  [30 %] 30 % Set Rate:  [25 bmp] 25 bmp Vt Set:  [550 mL] 550 mL PEEP:  [8 cmH20] 8 cmH20 Pressure Support:  [15 cmH20] 15 cmH20 Plateau Pressure:  [22 cmH20] 22 cmH20   Intake/Output Summary (Last 24 hours) at 10/31/2023 2841 Last data filed at 10/31/2023 0900 Gross per 24 hour  Intake 4207.15 ml  Output 3625 ml  Net 582.15 ml   Filed Weights   10/28/23 0500 10/29/23 0500 10/30/23 0500  Weight: 103 kg 105.2 kg 105.9 kg   Physical Exam: Gen:      No acute distress HEENT:  EOMI, sclera anicteric Neck:     No masses; no thyromegaly, ETT Lungs:    Clear to auscultation bilaterally; normal respiratory effort CV:         Regular rate and rhythm; no murmurs Abd:      + bowel sounds; soft, non-tender; no palpable masses, no distension Ext:    No edema; adequate peripheral perfusion Skin:      Warm and dry; no rash Neuro: Sedated  Lab/imaging reviewed Significant for sodium 155, BUN/creatinine 11/1.0 WBC 11.3, hemoglobin 12.2, platelets 354 No new imaging  Resolved Hospital Problem list   Lactic acidosis  Assessment & Plan:  Pontine Hemorrhagic stroke with trace [facility] -Neurology primary. Management and imaging per team -Neuroprotective measures -Neuro checks -SBP goal 130-150   Hypertensive Emergency -Cleviprex for goal SBP 130-150 On Norvasc 10 mg, Lopressor Started hydralazine, losartan Labetalol  as needed  Acute Respiratory Failure, hypoxic and hypercapnic -Full vent support No plans on weaning at present due to mental status Continue DuoNebs, follow intermittent chest x-ray Likely need a tracheostomy if we are to continue current level of support.  Family discussions are ongoing  Aspiration Pneumonia: CXR with right opacities -Unasyn for 7 days  AKI -Acute on chronic likely secondary to poorly treated h/o HTN -Replace electrolytes as needed  Goals of care Ongoing discussions with neurology  and palliative team with family I updated fianc and mom at bedside today.  Told them that his prognosis for neuro recovery is extremely poor and he will likely be vent dependent indefinitely.  If they want to continue current level of care then I would advocate an early tracheostomy and PEG tube placement.  They want to talk among family and get back to [place].  Best Practice (right click and "Reselect all SmartList Selections" daily)   Diet/type: tubefeeds DVT prophylaxis LMWH Pressure ulcer(s): N/A GI prophylaxis: H2B Lines: N/A Foley:  N/A Code Status:  full code  Critical care time:    The patient is critically ill with multiple organ system failure and requires high complexity decision making for assessment and support, frequent evaluation and titration of therapies, advanced monitoring, review of radiographic studies and interpretation of complex data.   Critical Care Time devoted to patient care services, exclusive of separately billable procedures, described in this note is 35 minutes.   Chilton Greathouse MD [facility] See Amion for pager  If no response to pager , please call [phone] until 7pm After 7:00 pm call Elink  [phone] 10/31/2023, 9:25 AM

## 2023-11-01 DIAGNOSIS — J9602 Acute respiratory failure with hypercapnia: Secondary | ICD-10-CM | POA: Diagnosis not present

## 2023-11-01 DIAGNOSIS — I613 Nontraumatic intracerebral hemorrhage in brain stem: Secondary | ICD-10-CM | POA: Diagnosis not present

## 2023-11-01 DIAGNOSIS — Z515 Encounter for palliative care: Secondary | ICD-10-CM | POA: Diagnosis not present

## 2023-11-01 DIAGNOSIS — Z7189 Other specified counseling: Secondary | ICD-10-CM | POA: Diagnosis not present

## 2023-11-01 DIAGNOSIS — J9601 Acute respiratory failure with hypoxia: Secondary | ICD-10-CM | POA: Diagnosis not present

## 2023-11-01 LAB — CULTURE, BLOOD (ROUTINE X 2)
Culture: NO GROWTH
Culture: NO GROWTH

## 2023-11-01 LAB — CBC
HCT: 38.7 % — ABNORMAL LOW (ref 39.0–52.0)
Hemoglobin: 12 g/dL — ABNORMAL LOW (ref 13.0–17.0)
MCH: 30.6 pg (ref 26.0–34.0)
MCHC: 31 g/dL (ref 30.0–36.0)
MCV: 98.7 fL (ref 80.0–100.0)
Platelets: 356 10*3/uL (ref 150–400)
RBC: 3.92 MIL/uL — ABNORMAL LOW (ref 4.22–5.81)
RDW: 14.6 % (ref 11.5–15.5)
WBC: 10.5 10*3/uL (ref 4.0–10.5)
nRBC: 0 % (ref 0.0–0.2)

## 2023-11-01 LAB — BASIC METABOLIC PANEL
Anion gap: 10 (ref 5–15)
BUN: 19 mg/dL (ref 6–20)
CO2: 25 mmol/L (ref 22–32)
Calcium: 8.6 mg/dL — ABNORMAL LOW (ref 8.9–10.3)
Chloride: 120 mmol/L — ABNORMAL HIGH (ref 98–111)
Creatinine, Ser: 1.27 mg/dL — ABNORMAL HIGH (ref 0.61–1.24)
GFR, Estimated: >60 mL/min (ref >60)
Glucose, Bld: 111 mg/dL — ABNORMAL HIGH (ref 70–99)
Potassium: 3.4 mmol/L — ABNORMAL LOW (ref 3.5–5.1)
Sodium: 155 mmol/L — ABNORMAL HIGH (ref 135–145)

## 2023-11-01 LAB — GLUCOSE, CAPILLARY
Glucose-Capillary: 103 mg/dL — ABNORMAL HIGH (ref 70–99)
Glucose-Capillary: 110 mg/dL — ABNORMAL HIGH (ref 70–99)
Glucose-Capillary: 114 mg/dL — ABNORMAL HIGH (ref 70–99)
Glucose-Capillary: 146 mg/dL — ABNORMAL HIGH (ref 70–99)
Glucose-Capillary: 86 mg/dL (ref 70–99)
Glucose-Capillary: 89 mg/dL (ref 70–99)

## 2023-11-01 LAB — SODIUM
Sodium: 155 mmol/L — ABNORMAL HIGH (ref 135–145)
Sodium: 154 mmol/L — ABNORMAL HIGH (ref 135–145)

## 2023-11-01 MED ORDER — POTASSIUM CHLORIDE 20 MEQ PO PACK
40.0000 meq | PACK | Freq: Once | ORAL | Status: AC
  Administered 2023-11-01: 40 meq
  Filled 2023-11-01: qty 2

## 2023-11-01 MED ORDER — CLONIDINE HCL 0.1 MG PO TABS
0.1000 mg | ORAL_TABLET | Freq: Three times a day (TID) | ORAL | Status: DC
Start: 2023-11-01 — End: 2023-11-04
  Administered 2023-11-01 – 2023-11-03 (×9): 0.1 mg
  Filled 2023-11-01 (×9): qty 1

## 2023-11-01 MED ORDER — HYDRALAZINE HCL 50 MG PO TABS
50.0000 mg | ORAL_TABLET | Freq: Once | ORAL | Status: AC
  Administered 2023-11-01: 50 mg
  Filled 2023-11-01: qty 1

## 2023-11-01 MED ORDER — SODIUM CHLORIDE 3 % IV SOLN
INTRAVENOUS | Status: DC
  Filled 2023-11-01 (×3): qty 500

## 2023-11-01 MED ORDER — HYDRALAZINE HCL 50 MG PO TABS
100.0000 mg | ORAL_TABLET | Freq: Three times a day (TID) | ORAL | Status: DC
  Administered 2023-11-02 – 2023-11-23 (×63): 100 mg
  Filled 2023-11-01 (×66): qty 2

## 2023-11-01 NOTE — Progress Notes (Signed)
 SLP Cancellation Note  Patient Details Name: Garrett [name] MRN: [id number] DOB: 06-Nov-1975   Cancelled treatment:   Pt remains on ventilator. SLP will sign off and await new orders should they be warranted.  [name] L. Samson Frederic, MA CCC/SLP Clinical Specialist - Acute Care SLP Acute Rehabilitation Services Office number [phone]         Blenda Mounts [name] 11/01/2023, 8:15 AM

## 2023-11-01 NOTE — Progress Notes (Signed)
 Palliative Medicine Progress Note   Patient Name: Garrett [name]       Date: 11/01/2023 DOB: [birth year]  Age: 48 y.o. MRN#: [id number] Attending Physician: Stroke, Md, MD Primary Care Physician: Pcp, No Admit Date: 10/27/2023  HPI/Patient Profile: 48 y.o. male  with past medical history of hypertension who presented to the ED on 10/27/2023 after he was found down at home by his fiance. He was found to have an acute large hemorrhage centered in the pons with intraventricular extension in the fourth ventricle and also extension into the basal cisterns. Patient was intubated in the ED.    Palliative Medicine has been consulted for goals of care in the setting of "large ICH".  Subjective:  Intubated. Neuro exam remains poor.    Goals of Care Meeting:   Providers: Dr. Pearlean Brownie, Dr. Isaiah Serge, Elmer Picker NP, Sherlean Foot NP   Family Members: mother/[name], father/Archie, 2 sisters, significant other - [name], daughters present by phone  We met with patient's family in the 4N conference room.  We reviewed his current medical condition and prognosis in the setting of large ICH.  Family verbalizes clear understanding of the medical situation and overall poor prognosis.  They are clear they do not want to prolong patient's suffering, and would not want to proceed with tracheostomy or PEG tube.  We did discuss complications of prolonged ET tube.   Family expresses they need time to process, especially in the setting of a recent death within the family this week (father's brother/patient's uncle). The funeral is this weekend. They are open to readdress goals of care and likely transition to comfort care sometime next week.     Objective:  Physical Exam Vitals reviewed.  Constitutional:       General: He is not in acute distress.    Appearance: He is ill-appearing.  Cardiovascular:     Rate and Rhythm: Normal rate.  Pulmonary:     Comments: intubated Neurological:     Mental Status: He is unresponsive.             Palliative Medicine Assessment & Plan   Assessment: Principal Problem:   ICH (intracerebral hemorrhage) (HCC) Active Problems:   Intracranial hemorrhage (HCC)    Recommendations/Plan: Code status changed to DNR - interventions Continue full scope care Allow time for family to process  the situation Family is clear they do not want to proceed with trach/PEG, likely transition to comfort care next week PMT will continue to follow    Thank you for allowing the Palliative Medicine Team to assist in the care of this patient.   Time: 54 minutes  Detailed review of medical records (labs, imaging, vital signs), medically appropriate exam, discussed with treatment team, counseling and education to family, & staff, documenting clinical information, coordination of care.    Merry Proud, NP   Please contact Palliative Medicine Team phone at [phone] for questions and concerns.  For individual providers, please see AMION.

## 2023-11-01 NOTE — Progress Notes (Signed)
 [facility] ADULT ICU REPLACEMENT PROTOCOL   The patient does apply for the [facility] Adult ICU Electrolyte Replacment Protocol based on the criteria listed below:   1.Exclusion criteria: TCTS, ECMO, Dialysis, and Myasthenia Gravis patients 2. Is GFR >/= 30 ml/min? Yes.    Patient's GFR today is >60 3. Is SCr </= 2? Yes.   Patient's SCr is 1.27 mg/dL 4. Did SCr increase >/= 0.5 in 24 hours? No. 5.Pt's weight >40kg  Yes.   6. Abnormal electrolyte(s):   K 3.4  7. Electrolytes replaced per protocol 8.  Call MD STAT for K+ </= 2.5, Phos </= 1, or Mag </= 1 Physician:  Shawn Stall R Braya Habermehl 11/01/2023 6:17 AM

## 2023-11-01 NOTE — TOC CM/SW Note (Signed)
 Transition of Care [facility]) - Inpatient Brief Assessment   Patient Details  Name: Garrett [name] MRN: [id number] Date of Birth: 06-16-1976  [facility]) CM/SW Contact:    Mearl Latin, LCSW Phone Number: 11/01/2023, 4:47 PM   Clinical Narrative: CSW met with patient's fiance and her son at bedside. She contacted patient's mother and gave her CSW's number. CSW briefly discussed with fiance that any SNF placements would likely be out of state. She stated she still needs to bring his insurance card in.   Transition of Care Asessment: Insurance and Status: Selfpay Patient has primary care physician: No Home environment has been reviewed: From home Prior level of function:: Independent Prior/Current Home Services: No current home services Social Drivers of Health Review: SDOH reviewed no interventions necessary Readmission risk has been reviewed: Yes Transition of care needs: no transition of care needs at this time

## 2023-11-01 NOTE — Progress Notes (Signed)
 NAME:  Garrett [name], MRN:  [id number], DOB:  02/21/1976, LOS: 5 ADMISSION DATE:  10/27/2023, CONSULTATION DATE:  10/27/2023 REFERRING MD:  Coralee Pesa, DO CHIEF COMPLAINT:  Found Down  History of Present Illness:   48 y/o male with h/o HTN who was found down for unknown downtime by his [place] when she came home from work, he was having agonal breathing.  He apparently had driven her to work this morning.  He has HTN but never checks his BP and does not follow with MD.  She says you can tell when his BP is really high; his eyes get blood shot and he is sweating.  No h/o seizures.  Besides almost daily [name] and cigarette smoking, she is not aware of any other drugs. Drug screen positive for opioids and he was given several doses of Narcan. Patient found to have:  1. 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 Basal cisterns and fourth ventricle are effaced without hydrocephalus at this time. 2. Trace additional subarachnoid hemorrhage along the left parietal convexity. BP in ED 262/156. His cxr revealed a RUL and perihilar infiltrates suggesting aspiration pneumonia. ED labs also revealed PH 7.169, LA 4.4, CPK 985. Patient was intubated in ED.  Pertinent  Medical History  HTN  Significant Hospital Events: Including procedures, antibiotic start and stop dates in addition to other pertinent events   3/8: Intubated in ED, pontine bleed/SAH. CT head 17:09 Acute large hemorrhage 4.1 cm in pons with intraventricular extension without hydrocephalus 3/9: CTA 03:14 AM No change in IPH in pons, similar biparietal SAH  Interim History / Subjective:   Remains on Cleviprex Continues to have poor mental status on the ventilator  Objective   Blood pressure 120/77, pulse 70, temperature 98.2 F (36.8 C), resp. rate (!) 25, height 5' 7.99" (1.727 m), weight 106 kg, SpO2 98%.     Vent Mode: PRVC FiO2 (%):  [30 %] 30 % Set Rate:  [25 bmp] 25 bmp Vt Set:  [550 mL] 550 mL PEEP:  [8 cmH20] 8 cmH20 Plateau Pressure:  [22 cmH20] 22 cmH20   Intake/Output Summary (Last 24 hours) at 11/01/2023 1155 Last data filed at 11/01/2023 0800 Gross per 24 hour  Intake 2171.54 ml  Output 1950 ml  Net 221.54 ml   Filed Weights   10/29/23 0500 10/30/23 0500 11/01/23 0500  Weight: 105.2 kg 105.9 kg 106 kg   Physical Exam: Gen:      No acute distress HEENT:  EOMI, sclera anicteric Neck:     No masses; no thyromegaly, ETT Lungs:    Clear to auscultation bilaterally; normal respiratory effort CV:         Regular rate and rhythm; no murmurs Abd:      + bowel sounds; soft, non-tender; no palpable masses, no distension Ext:    No edema; adequate peripheral perfusion Skin:      Warm and dry; no rash Neuro: Sedated, unresponsive  Lab/imaging reviewed Significant for sodium 155, potassium 3.4, BUN/creatinine 19/1.27  CBC stable, no new imaging  Resolved Hospital Problem list   Lactic acidosis  Assessment & Plan:  Pontine Hemorrhagic stroke with trace [facility] -Neurology primary. Management and imaging per team -Neuroprotective measures -Neuro checks -SBP goal 130-150   Hypertensive Emergency -Cleviprex for goal SBP 130-150 On Norvasc 10 mg, Lopressor, hydralazine, losartan Labetalol as needed  Acute Respiratory Failure, hypoxic and hypercapnic -Full vent support No  plans on weaning at present due to mental status Continue DuoNebs, follow intermittent chest x-ray Likely need a tracheostomy if we are to continue current level of support.  Family discussions are ongoing  Aspiration Pneumonia: CXR with right opacities -Unasyn for 7 days  AKI -Acute on chronic likely secondary to poorly treated h/o HTN -Replace electrolytes as needed  Goals of care Ongoing discussions with neurology and palliative team with family I updated fianc and mom at bedside on 3/12.  Told them  that his prognosis for neuro recovery is extremely poor and he will likely be vent dependent indefinitely.  If they want to continue current level of care then I would advocate an early tracheostomy and PEG tube placement.  They want to talk among family and get back to [place]. Family meeting with palliative and neurology planned today 3/13  Best Practice (right click and "Reselect all SmartList Selections" daily)   Diet/type: tubefeeds DVT prophylaxis LMWH Pressure ulcer(s): N/A GI prophylaxis: H2B Lines: N/A Foley:  Yes, and it is still needed Code Status:  full code  Critical care time:    The patient is critically ill with multiple organ system failure and requires high complexity decision making for assessment and support, frequent evaluation and titration of therapies, advanced monitoring, review of radiographic studies and interpretation of complex data.   Critical Care Time devoted to patient care services, exclusive of separately billable procedures, described in this note is 35 minutes.   Chilton Greathouse MD [facility] See Amion for pager  If no response to pager , please call [phone] until 7pm After 7:00 pm call Elink  [phone] 11/01/2023, 11:55 AM

## 2023-11-01 NOTE — IPAL (Signed)
  Interdisciplinary Goals of Care Family Meeting   Date carried out: 11/01/2023  Location of the meeting: Conference room  Member's involved: Physician, Nurse Practitioner, Family Member or next of kin, and Palliative care team member, CCM MD- Dr. Isaiah Serge, Neurologist Dr. Pearlean Brownie, Palliative Care- Richrd Humbles NP, Neurology- Elmer Picker NP  Durable Power of Attorney or acting medical decision maker: Mother- Joyce Gross, Father-Archie, Significant Other- [name], Children present via phone   Discussion: We discussed goals of care for [employer] . Family understands that his prognosis is grim and they do not want to prolong his suffering unnecessarily. They also expressed that the do need time to come to terms with this devastating situation given other family tragedies that they have suffered this week. They additionally would not like him to be on long term life support with a tracheostomy or PEG tube. We did discuss that he has developed pneumonia which is a common complication of the ET Tube. They would like a couple of days to come to terms with this but ultimately do not want [place] to do CPR or prolong his suffering. Family and care team in agreement with continuing current care under a DNR code status.   Code status:   Code Status: Do not attempt resuscitation (DNR) PRE-ARREST INTERVENTIONS DESIRED   Disposition: Continue current acute care     Elmer Picker, NP  11/01/2023, 5:48 PM

## 2023-11-01 NOTE — Progress Notes (Addendum)
 STROKE TEAM PROGRESS NOTE   INTERIM HISTORY/SUBJECTIVE Patient has remained hemodynamically stable and afebrile.  Neurological exam remains poor.   His fiance is at the bedside and we spoke to the mother over the phone. Neurological exam remains quite poor with trace lower extremity extensor response to noxious stimuli.  Absent corneals and pupillary and weak cough and gag reflexes Now he has some ocular bobbing with the right eye more than left eye  Family meeting with palliative and CCM prior to trach/PEG on 3/13 at 1630  OBJECTIVE  CBC    Component Value Date/Time   WBC 10.5 11/01/2023 0407   RBC 3.92 (L) 11/01/2023 0407   HGB 12.0 (L) 11/01/2023 0407   HCT 38.7 (L) 11/01/2023 0407   PLT 356 11/01/2023 0407   MCV 98.7 11/01/2023 0407   MCH 30.6 11/01/2023 0407   MCHC 31.0 11/01/2023 0407   RDW 14.6 11/01/2023 0407   LYMPHSABS 1.3 10/27/2023 1826   MONOABS 1.4 (H) 10/27/2023 1826   EOSABS 0.1 10/27/2023 1826   BASOSABS 0.1 10/27/2023 1826    BMET    Component Value Date/Time   NA 155 (H) 11/01/2023 0407   K 3.4 (L) 11/01/2023 0407   CL 120 (H) 11/01/2023 0407   CO2 25 11/01/2023 0407   GLUCOSE 111 (H) 11/01/2023 0407   BUN 19 11/01/2023 0407   CREATININE 1.27 (H) 11/01/2023 0407   CALCIUM 8.6 (L) 11/01/2023 0407   GFRNONAA >60 11/01/2023 0407    IMAGING past 24 hours No results found.   Vitals:   11/01/23 0615 11/01/23 0700 11/01/23 0800 11/01/23 0814  BP: 125/75 124/82 132/88   Pulse: 85 85 85 85  Resp: (!) 25 (!) 25 (!) 25   Temp: 99.1 F (37.3 C) 98.8 F (37.1 C) 98.6 F (37 C)   TempSrc:      SpO2: 97% 97% 98% 98%  Weight:      Height:         PHYSICAL EXAM General: Unresponsive African-American male Psych: Unresponsive CV: Regular rate and rhythm on monitor Respiratory: Mechanically ventilated, respirations synchronous with ventilator   NEURO:  Mental Status: Unresponsive, intubated, sedation paused  Cranial Nerves:  II: Pupils  pinpoint, nonreactive, corneal absent bilaterally III, IV, VI: Eyes midline,  absent oculocephalic reflex. Ocular bobbing right eye greater than left VII: Face is symmetrical resting with ET tube present IX, X: Cough intact, gag absent XI: Head is midline XII: ETT in place Motor/Sensory: No purposeful movement.  Trace extensor posturing in lower extremities to noxious Sensation-appears intact to noxious in lower extremities Coordination: Deferred Gait- deferred  Most Recent NIH 65     ASSESSMENT/PLAN  [name] is a 48 y.o. male with history of HTN admitted for ICH.    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 Basal cisterns and fourth ventricle are effaced without hydrocephalus at this time. Trace additional subarachnoid hemorrhage along the left parietal convexity. CTA head & neck no LVO 2D Echo EF 60 to 65%, moderate concentric LVH, grade 1 diastolic dysfunction, normal left atrial size, no atrial level shunt LDL pending HgbA1c 4.7 VTE prophylaxis - SCDs No antithrombotic prior to admission, now on No antithrombotic secondary to ICH Therapy recommendations:  Pending  Disposition:  pending, palliative care consulted to discuss goals of care with family  Cerebral edema (brain compression) Neuro exam remains poor, given concern for edema in the brainstem and surrounding structures 3% hypertonic saline with 250 cc bolus and rate of 75 cc/h -> on hold Measure sodium every 6 hours, goal 150-155 Na 156 -> hypertonic on hold -> continue q6 checks  Hypertension Home meds:  None Unstable Currently requiring cleviprex PRN labetalol  Blood Pressure  Goal: SBP between 130-150 for 24 hours and then less than 160   Hypoxic respiratory failure Aspiration PNA Intubated in the ED PCCM following SVT/VAP per CCM CXR- Mild patchy medial right upper lobe and right perihilar airspace opacities. Lactic- 5.8 WBC 20.4 ABX: Unasyn   Dysphagia OG in place    Diet   Diet NPO time specified    Substance use UDS positive for opioids Narcan given in ED  Other Active Problems Hypokalemia K2.6 -> replaced -> 4.2-> 3.3-> repleted -> 3.4 AKI Cr 2.2 GFR 36  Hospital day # 5  Patient seen and examined by NP/APP with MD. MD to update note as needed.   Elmer Picker, DNP, FNP-BC [facility] Pager: [phone]  I have personally obtained history,examined this patient, reviewed notes, independently viewed imaging studies, participated in medical decision making and plan of care.ROS completed by me personally and pertinent positives fully documented  I have made any additions or clarifications directly to the above note. Agree with note above.  Patient neurological exam remains quite poor and he remains unresponsive and not following commands.  He has some mild ocular bobbing and trace withdrawal in the lower extremity to noxious stimuli only.  He is off hypertonic saline but serum sodium is yet therapeutic at 155.  Blood pressure is adequately controlled.  Long discussion with girlfriend at the bedside and his mom over the phone.  Plan for family visit this evening at 430 with palliative care and critical care team will again to reiterate his poor prognosis and goals of care Discussed with critical care team This patient is critically ill and at significant risk of neurological worsening, death and care requires constant monitoring of vital signs, hemodynamics,respiratory and cardiac monitoring, extensive review of multiple databases, frequent neurological assessment, discussion with family, other specialists and medical decision making  of high complexity.I have made any additions or clarifications directly to the above note.This critical care time does not reflect procedure time, or teaching time or supervisory time of PA/NP/Med Resident etc but could involve care discussion time.  I spent 30 minutes of neurocritical care time  in the care of  this patient.     Delia Heady, MD Medical Director [facility] Stroke Center Pager: [phone] 11/01/2023 3:55 PM   To contact Stroke Continuity provider, please refer to [email or web address]. After hours, contact General Neurology

## 2023-11-02 ENCOUNTER — Inpatient Hospital Stay (HOSPITAL_COMMUNITY): Payer: MEDICAID

## 2023-11-02 DIAGNOSIS — J690 Pneumonitis due to inhalation of food and vomit: Secondary | ICD-10-CM | POA: Diagnosis not present

## 2023-11-02 DIAGNOSIS — J9601 Acute respiratory failure with hypoxia: Secondary | ICD-10-CM | POA: Diagnosis not present

## 2023-11-02 DIAGNOSIS — I613 Nontraumatic intracerebral hemorrhage in brain stem: Secondary | ICD-10-CM | POA: Diagnosis not present

## 2023-11-02 DIAGNOSIS — I619 Nontraumatic intracerebral hemorrhage, unspecified: Secondary | ICD-10-CM | POA: Diagnosis not present

## 2023-11-02 DIAGNOSIS — G835 Locked-in state: Secondary | ICD-10-CM

## 2023-11-02 LAB — CBC
HCT: 35.8 % — ABNORMAL LOW (ref 39.0–52.0)
Hemoglobin: 10.9 g/dL — ABNORMAL LOW (ref 13.0–17.0)
MCH: 31.1 pg (ref 26.0–34.0)
MCHC: 30.4 g/dL (ref 30.0–36.0)
MCV: 102.3 fL — ABNORMAL HIGH (ref 80.0–100.0)
Platelets: 330 10*3/uL (ref 150–400)
RBC: 3.5 MIL/uL — ABNORMAL LOW (ref 4.22–5.81)
RDW: 14.8 % (ref 11.5–15.5)
WBC: 9.8 10*3/uL (ref 4.0–10.5)
nRBC: 0 % (ref 0.0–0.2)

## 2023-11-02 LAB — GLUCOSE, CAPILLARY
Glucose-Capillary: 140 mg/dL — ABNORMAL HIGH (ref 70–99)
Glucose-Capillary: 100 mg/dL — ABNORMAL HIGH (ref 70–99)
Glucose-Capillary: 102 mg/dL — ABNORMAL HIGH (ref 70–99)
Glucose-Capillary: 129 mg/dL — ABNORMAL HIGH (ref 70–99)
Glucose-Capillary: 136 mg/dL — ABNORMAL HIGH (ref 70–99)
Glucose-Capillary: 99 mg/dL (ref 70–99)

## 2023-11-02 LAB — BASIC METABOLIC PANEL
Anion gap: 9 (ref 5–15)
BUN: 41 mg/dL — ABNORMAL HIGH (ref 6–20)
CO2: 23 mmol/L (ref 22–32)
Calcium: 8.3 mg/dL — ABNORMAL LOW (ref 8.9–10.3)
Chloride: 127 mmol/L — ABNORMAL HIGH (ref 98–111)
Creatinine, Ser: 1.54 mg/dL — ABNORMAL HIGH (ref 0.61–1.24)
GFR, Estimated: 56 mL/min — ABNORMAL LOW (ref >60)
Glucose, Bld: 120 mg/dL — ABNORMAL HIGH (ref 70–99)
Potassium: 4.1 mmol/L (ref 3.5–5.1)
Sodium: 159 mmol/L — ABNORMAL HIGH (ref 135–145)

## 2023-11-02 LAB — SODIUM: Sodium: 154 mmol/L — ABNORMAL HIGH (ref 135–145)

## 2023-11-02 LAB — MAGNESIUM: Magnesium: 2.6 mg/dL — ABNORMAL HIGH (ref 1.7–2.4)

## 2023-11-02 MED ORDER — FENTANYL CITRATE PF 50 MCG/ML IJ SOSY
50.0000 ug | PREFILLED_SYRINGE | INTRAMUSCULAR | Status: DC | PRN
  Administered 2023-11-02 – 2023-11-25 (×28): 50 ug via INTRAVENOUS
  Filled 2023-11-02 (×30): qty 1

## 2023-11-02 MED ORDER — LORAZEPAM 1 MG PO TABS
1.0000 mg | ORAL_TABLET | Freq: Four times a day (QID) | ORAL | Status: DC | PRN
  Administered 2023-11-02 – 2023-11-06 (×4): 1 mg
  Filled 2023-11-02 (×4): qty 1

## 2023-11-02 MED ORDER — OXYCODONE HCL 5 MG PO TABS
5.0000 mg | ORAL_TABLET | Freq: Four times a day (QID) | ORAL | Status: DC | PRN
  Administered 2023-11-02 – 2023-11-26 (×9): 5 mg
  Filled 2023-11-02 (×10): qty 1

## 2023-11-02 MED ORDER — BANATROL TF EN LIQD
60.0000 mL | Freq: Two times a day (BID) | ENTERAL | Status: DC
  Administered 2023-11-02 – 2023-11-07 (×11): 60 mL
  Filled 2023-11-02 (×11): qty 60

## 2023-11-02 NOTE — Progress Notes (Signed)
 Dr Denese Killings had RN turn of propofol gtt this am.  Patient became progressively hypertensive despite RN's utilization of prn labetalol.  Dr. Denese Killings was alerted and orders were given for prn IV fentanyl, prn anti anxiety medications.

## 2023-11-02 NOTE — Procedures (Signed)
 Cortrak  Person Inserting Tube:  Thomas, Aspen T, RD Tube Type:  Cortrak - 43 inches Tube Size:  10 Tube Location:  Right nare Secured by: Bridle Technique Used to Measure Tube Placement:  Marking at nare/corner of mouth Cortrak Secured At:  70 cm   Cortrak Tube Team Note:  Consult received to place a Cortrak feeding tube.   X-ray is required, await results before using tube.   If the tube becomes dislodged please keep the tube and contact the Cortrak team at [email or web address] for replacement.  If after hours and replacement cannot be delayed, place a NG tube and confirm placement with an abdominal x-ray.    Cammy Copa., RD, LDN, CNSC See AMiON for contact information

## 2023-11-02 NOTE — Progress Notes (Signed)
 Nutrition Follow-up  DOCUMENTATION CODES:   Not applicable  INTERVENTION:   Tube feeding via Cortrak tube: Osmolite 1.5 at 55 ml/h (1320 ml per day)  Prosource TF20 60 ml BID  Provides 2160 kcal, 123 gm protein, 1003 ml free water daily  Add Banatrol BID  NUTRITION DIAGNOSIS:   Inadequate oral intake related to inability to eat as evidenced by NPO status. Ongoing.   GOAL:   Patient will meet greater than or equal to 90% of their needs Met with TF at goal  MONITOR:   TF tolerance, I & O's  REASON FOR ASSESSMENT:   Consult Enteral/tube feeding initiation and management  ASSESSMENT:   Pt with PMH of HTN, daily mariajuana, and smoker admitted after being found down at home with pontine hemorrhagic stroke and trace SAH. UDS positive for opioids. Noted aspiration PNA on admission.  Pt discussed during ICU rounds and with RN and MD.  Per MD pt now more awake, able to move eyes vertically to command but locked-in.  Palliative care following.  Spoke with fiance and sister who are at bedside.  Cortrak placed today, tip gastric .    3/8 - admitted with [facility], intubated 3/9 - Adult TF protocol started 3/14 - s/p cortrak placement, tip gastric  Medications reviewed and include: pepcid, novolog SSI every 4 hours Propofol @ 18 ml/hr provides: 475 kcal  Hypertonic saline  Labs reviewed:  Na 154 K 3.4 A1C: 4.7 CBG's: 86-146   UOP: 690 ml  Diet Order:   Diet Order             Diet NPO time specified  Diet effective now                   EDUCATION NEEDS:   No education needs have been identified at this time  Skin:  Skin Assessment: Reviewed RN Assessment  Last BM:  1000 via FMS, bowel regimen d/c'ed  Height:   Ht Readings from Last 1 Encounters:  11/01/23 5' 7.99" (1.727 m)    Weight:   Wt Readings from Last 1 Encounters:  11/02/23 107.5 kg    Ideal Body Weight:  70 kg  BMI:  Body mass index is 36.04 kg/m.  Estimated Nutritional Needs:    Kcal:  2000-2200  Protein:  115-130 grams  Fluid:  >2 L/day  Cammy Copa., RD, LDN, CNSC See AMiON for contact information

## 2023-11-02 NOTE — Progress Notes (Addendum)
 STROKE TEAM PROGRESS NOTE   INTERIM HISTORY/SUBJECTIVE Patient is seen in his room with family at the bedside.  He has remained hemodynamically stable and afebrile.  He is able to move his eyes up and down to command and appears to understand what is being said to him.  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  OBJECTIVE  CBC    Component Value Date/Time   WBC 9.8 11/02/2023 0819   RBC 3.50 (L) 11/02/2023 0819   HGB 10.9 (L) 11/02/2023 0819   HCT 35.8 (L) 11/02/2023 0819   PLT 330 11/02/2023 0819   MCV 102.3 (H) 11/02/2023 0819   MCH 31.1 11/02/2023 0819   MCHC 30.4 11/02/2023 0819   RDW 14.8 11/02/2023 0819   LYMPHSABS 1.3 10/27/2023 1826   MONOABS 1.4 (H) 10/27/2023 1826   EOSABS 0.1 10/27/2023 1826   BASOSABS 0.1 10/27/2023 1826    BMET    Component Value Date/Time   NA 159 (H) 11/02/2023 0819   K 4.1 11/02/2023 0819   CL 127 (H) 11/02/2023 0819   CO2 23 11/02/2023 0819   GLUCOSE 120 (H) 11/02/2023 0819   BUN 41 (H) 11/02/2023 0819   CREATININE 1.54 (H) 11/02/2023 0819   CALCIUM 8.3 (L) 11/02/2023 0819   GFRNONAA 56 (L) 11/02/2023 0819    IMAGING past 24 hours No results found.   Vitals:   11/02/23 0900 11/02/23 1000 11/02/23 1025 11/02/23 1055  BP: (!) 145/98 (!) 173/108 (!) 161/105 (!) 161/105  Pulse: 70 99 89 86  Resp: (!) 25 (!) 25 (!) 28 (!) 27  Temp: (!) 97.5 F (36.4 C) 97.7 F (36.5 C) 98.1 F (36.7 C)   TempSrc:      SpO2: 99% 100% 98% 98%  Weight:      Height:         PHYSICAL EXAM General: Intubated patient in no acute distress Psych: Unresponsive CV: Regular rate and rhythm on monitor Respiratory: Mechanically ventilated, respirations synchronous with ventilator   NEURO (on sedation with low-dose propofol):  Mental Status: Patient is able to move eyes up and down to command.  He moves his eyes up when asked to do so if he  understands what is being said to him.  He is unable to move his eyes horizontally or speak or make any other voluntary movements.  Cranial Nerves:  II: Pupils pinpoint, nonreactive,  III, IV, VI: Eyes midline, dysconjugate at times, vertical eye movements intact but horizontal eye movements absent  VII: Face is symmetrical resting with ET tube present IX, X: Cough intact, gag absent XI: Head is midline XII: ETT in place Motor/Sensory: No purposeful movement.  Trace extensor posturing in lower extremities to noxious Sensation-appears intact to noxious in lower extremities Coordination: Deferred Gait- deferred  Most Recent NIH 19     ASSESSMENT/PLAN  [name] is a 48 y.o. male with history of HTN admitted for ICH.    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 Basal cisterns and fourth ventricle are effaced without hydrocephalus at this time. Trace additional subarachnoid hemorrhage along the left parietal convexity. CTA head & neck no LVO  2D Echo EF 60 to 65%, moderate concentric LVH, grade 1 diastolic dysfunction, normal left atrial size, no atrial level shunt LDL pending HgbA1c 4.7 VTE prophylaxis - SCDs No antithrombotic prior to admission, now on No antithrombotic secondary to ICH Therapy recommendations:  Pending Disposition:  pending, palliative care consulted to discuss goals of care with family  Cerebral edema (brain compression) Neuro exam remains poor, given concern for edema in the brainstem and surrounding structures 3% hypertonic saline with 250 cc bolus and rate of 75 cc/h -> on hold Measure sodium every 6 hours, goal 150-155 Na 156  -> hypertonic on hold -> continue q6 checks, most recent sodium 159  Hypertension Home meds:  None Stable, no longer requiring Cleviprex PRN labetalol  Blood Pressure Goal: SBP between 130-150 for 24 hours and then less than 160   Hypoxic respiratory failure Aspiration PNA Intubated in the ED PCCM following SVT/VAP per CCM CXR- Mild patchy medial right upper lobe and right perihilar airspace opacities. Lactic- 5.8 WBC 20.4-> 9.8 ABX: Unasyn   Dysphagia OG in place    Diet   Diet NPO time specified    Substance use UDS positive for opioids Narcan given in ED  Other Active Problems Hypokalemia K2.6 -> replaced -> 4.2-> 3.3-> repleted -> 3.4->4.1 AKI Cr 2.2-> 1.54 GFR 56  Hospital day # 6  Patient seen and examined by NP/APP with MD. MD to update note as needed.   Cortney E Ernestina Columbia , MSN, AGACNP-BC [facility] See Amion for schedule and pager information 11/02/2023 11:25 AM   I have personally obtained history,examined this patient, reviewed notes, independently viewed imaging studies, participated in medical decision making and plan of care.ROS completed by me personally and pertinent positives fully documented  I have made any additions or clarifications directly to the above note. Agree with note above.  Patient mental status has improved now when sedation has been weaned off and he is awake and in locked-in state.  He is able to communicate with vertical eye movements and elevating his eyelids has no other cranial nerve or extremity movement.  Discussed with his fiance at the bedside and spoke to his mother over the phone.  Family understands that patient is in a locked-in state and needs more time to come out for goals of care.  Discussed with critical care team. This patient is critically ill and at significant risk of neurological worsening, death and care requires constant monitoring of vital signs, hemodynamics,respiratory and cardiac monitoring,  extensive review of multiple databases, frequent neurological assessment, discussion with family, other specialists and medical decision making of high complexity.I have made any additions or clarifications directly to the above note.This critical care time does not reflect procedure time, or teaching time or supervisory time of PA/NP/Med Resident etc but could involve care discussion time.  I spent 30 minutes of neurocritical care time  in the care of  this patient.      Delia Heady, MD Medical Director [facility] Stroke Center Pager: [phone] 11/02/2023 3:31 PM  To contact Stroke Continuity provider, please refer to [email or web address]. After hours, contact General Neurology

## 2023-11-02 NOTE — Progress Notes (Signed)
 Confirmed with CCM NP Joneen Roach that Cortrak was in the stomach and RN is now able to use for medicaitons/tube feeds.

## 2023-11-02 NOTE — Progress Notes (Signed)
 NAME:  Garrett [name], MRN:  [id number], DOB:  02/28/1976, LOS: 6 ADMISSION DATE:  10/27/2023, CONSULTATION DATE:  10/27/2023 REFERRING MD:  Coralee Pesa, DO CHIEF COMPLAINT:  Found Down  History of Present Illness:   48 y/o male with h/o HTN who was found down for unknown downtime by his [place] when she came home from work, he was having agonal breathing.  He apparently had driven her to work this morning.  He has HTN but never checks his BP and does not follow with MD.  She says you can tell when his BP is really high; his eyes get blood shot and he is sweating.  No h/o seizures.  Besides almost daily [name] and cigarette smoking, she is not aware of any other drugs. Drug screen positive for opioids and he was given several doses of Narcan. Patient found to have:  1. 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 Basal cisterns and fourth ventricle are effaced without hydrocephalus at this time. 2. Trace additional subarachnoid hemorrhage along the left parietal convexity. BP in ED 262/156. His cxr revealed a RUL and perihilar infiltrates suggesting aspiration pneumonia. ED labs also revealed PH 7.169, LA 4.4, CPK 985. Patient was intubated in ED.  Pertinent  Medical History  HTN  Significant Hospital Events: Including procedures, antibiotic start and stop dates in addition to other pertinent events   3/8: Intubated in ED, pontine bleed/SAH. CT head 17:09 Acute large hemorrhage 4.1 cm in pons with intraventricular extension without hydrocephalus 3/9: CTA 03:14 AM No change in IPH in pons, similar biparietal SAH  Interim History / Subjective:   Remains on Cleviprex Continues to have poor mental status on the ventilator  Objective   Blood pressure (!) 133/100, pulse 67, temperature 97.9 F (36.6 C), resp. rate (!) 25, height 5' 7.99" (1.727 m), weight 107.5 kg, SpO2  100%.    Vent Mode: PRVC FiO2 (%):  [30 %] 30 % Set Rate:  [25 bmp] 25 bmp Vt Set:  [550 mL] 550 mL PEEP:  [8 cmH20] 8 cmH20 Plateau Pressure:  [20 cmH20-22 cmH20] 21 cmH20   Intake/Output Summary (Last 24 hours) at 11/02/2023 0865 Last data filed at 11/02/2023 0800 Gross per 24 hour  Intake 2882.77 ml  Output 1690 ml  Net 1192.77 ml   Filed Weights   10/30/23 0500 11/01/23 0500 11/02/23 0500  Weight: 105.9 kg 106 kg 107.5 kg   Physical Exam: Gen:      No acute distress HEENT:  EOMI, sclera anicteric Neck:     No masses; no thyromegaly, ETT Lungs:    Clear to auscultation bilaterally; normal respiratory effort CV:         Regular rate and rhythm; no murmurs Abd:      + bowel sounds; soft, non-tender; no palpable masses, no distension Ext:    No edema; adequate peripheral perfusion Skin:      Warm and dry; no rash Neuro:  Awake and follows commands with eyes and blinking. Not able to move limbs.   Ancillary Tests Personally Reviewed:  Hypernatremia 154 Creatinine 1.27 CT head on 3/9 shows extensive hemorrhage involving better part of the brainstem.  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  AKI   Plan:   - Continue mechanical ventilation and  leave on PSV to help with synchrony.  but mental status precludes safe extubation.  - Stop hyperosmolar therapy as now several days out from hemorrhage. Allow to drift down. - Keep off all sedation.  - Target SBP 130-150. On enteral agents only.  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   Best Practice (right click and "Reselect all SmartList Selections" daily)   Diet/type: tubefeeds DVT prophylaxis LMWH Pressure ulcer(s): N/A GI prophylaxis: H2B Lines: N/A Foley:  external catheter.  Code Status:  DNR with pre-arrest intervention   CRITICAL  CARE Performed by: Lynnell Catalan   Total critical care time: 40 minutes  Critical care time was exclusive of separately billable procedures and treating other patients.  Critical care was necessary to treat or prevent imminent or life-threatening deterioration.  Critical care was time spent personally by me on the following activities: development of treatment plan with patient and/or surrogate as well as nursing, discussions with consultants, evaluation of patient's response to treatment, examination of patient, obtaining history from patient or surrogate, ordering and performing treatments and interventions, ordering and review of laboratory studies, ordering and review of radiographic studies, pulse oximetry, re-evaluation of patient's condition and participation in multidisciplinary rounds.  Lynnell Catalan, MD [facility] ICU Physician [facility] [place] Critical Care  Pager: [phone] Mobile: [phone] After hours: [phone].

## 2023-11-03 ENCOUNTER — Inpatient Hospital Stay (HOSPITAL_COMMUNITY): Payer: MEDICAID

## 2023-11-03 DIAGNOSIS — I10 Essential (primary) hypertension: Secondary | ICD-10-CM

## 2023-11-03 DIAGNOSIS — G935 Compression of brain: Secondary | ICD-10-CM

## 2023-11-03 DIAGNOSIS — J9601 Acute respiratory failure with hypoxia: Secondary | ICD-10-CM | POA: Diagnosis not present

## 2023-11-03 DIAGNOSIS — J690 Pneumonitis due to inhalation of food and vomit: Secondary | ICD-10-CM | POA: Diagnosis not present

## 2023-11-03 DIAGNOSIS — F1990 Other psychoactive substance use, unspecified, uncomplicated: Secondary | ICD-10-CM

## 2023-11-03 DIAGNOSIS — I613 Nontraumatic intracerebral hemorrhage in brain stem: Secondary | ICD-10-CM | POA: Diagnosis not present

## 2023-11-03 DIAGNOSIS — R1310 Dysphagia, unspecified: Secondary | ICD-10-CM

## 2023-11-03 DIAGNOSIS — I619 Nontraumatic intracerebral hemorrhage, unspecified: Secondary | ICD-10-CM | POA: Diagnosis not present

## 2023-11-03 DIAGNOSIS — N179 Acute kidney failure, unspecified: Secondary | ICD-10-CM | POA: Diagnosis not present

## 2023-11-03 LAB — BASIC METABOLIC PANEL
Anion gap: 10 (ref 5–15)
BUN: 38 mg/dL — ABNORMAL HIGH (ref 6–20)
CO2: 21 mmol/L — ABNORMAL LOW (ref 22–32)
Calcium: 8.4 mg/dL — ABNORMAL LOW (ref 8.9–10.3)
Chloride: 126 mmol/L — ABNORMAL HIGH (ref 98–111)
Creatinine, Ser: 1.38 mg/dL — ABNORMAL HIGH (ref 0.61–1.24)
GFR, Estimated: >60 mL/min (ref >60)
Glucose, Bld: 152 mg/dL — ABNORMAL HIGH (ref 70–99)
Potassium: 3.5 mmol/L (ref 3.5–5.1)
Sodium: 157 mmol/L — ABNORMAL HIGH (ref 135–145)

## 2023-11-03 LAB — CBC
HCT: 34.8 % — ABNORMAL LOW (ref 39.0–52.0)
Hemoglobin: 10.8 g/dL — ABNORMAL LOW (ref 13.0–17.0)
MCH: 31.7 pg (ref 26.0–34.0)
MCHC: 31 g/dL (ref 30.0–36.0)
MCV: 102.1 fL — ABNORMAL HIGH (ref 80.0–100.0)
Platelets: 392 10*3/uL (ref 150–400)
RBC: 3.41 MIL/uL — ABNORMAL LOW (ref 4.22–5.81)
RDW: 14.9 % (ref 11.5–15.5)
WBC: 13.2 10*3/uL — ABNORMAL HIGH (ref 4.0–10.5)
nRBC: 0 % (ref 0.0–0.2)

## 2023-11-03 LAB — GLUCOSE, CAPILLARY
Glucose-Capillary: 129 mg/dL — ABNORMAL HIGH (ref 70–99)
Glucose-Capillary: 117 mg/dL — ABNORMAL HIGH (ref 70–99)
Glucose-Capillary: 143 mg/dL — ABNORMAL HIGH (ref 70–99)
Glucose-Capillary: 147 mg/dL — ABNORMAL HIGH (ref 70–99)
Glucose-Capillary: 150 mg/dL — ABNORMAL HIGH (ref 70–99)
Glucose-Capillary: 133 mg/dL — ABNORMAL HIGH (ref 70–99)

## 2023-11-03 MED ORDER — FUROSEMIDE 10 MG/ML IJ SOLN
40.0000 mg | Freq: Once | INTRAMUSCULAR | Status: AC
  Administered 2023-11-03: 40 mg via INTRAVENOUS
  Filled 2023-11-03: qty 4

## 2023-11-03 MED ORDER — FREE WATER
200.0000 mL | Status: DC
  Administered 2023-11-03 – 2023-11-04 (×5): 200 mL

## 2023-11-03 NOTE — Progress Notes (Addendum)
 STROKE TEAM PROGRESS NOTE   INTERIM HISTORY/SUBJECTIVE Patient is seen in his room with his girlfriend at the bedside.  He has remained off of sedation since yesterday.  Patient with some fevers overnight to 101.5 F.  Per bedside RN, patient is not moving eyes up and down to command though patient was up frequently throughout the night.  Remains unable to move his eyes horizontally or make voluntary movements of extremities throughout.  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  Patient's girlfriend reports that patient held phone independently and tapped phone.  She asked reports that he squeezed her hand though on exam this morning, the patient is unable to move extremities spontaneously or to command.  Explained reflexive movements to patient's girlfriend at bedside.  Na 154 -> 159, free water flushes ordered with resulting Na of 157.  Some improvement in renal function noted Cr 1.54 -> 1.28.   OBJECTIVE CBC    Component Value Date/Time   WBC 13.2 (H) 11/03/2023 0750   RBC 3.41 (L) 11/03/2023 0750   HGB 10.8 (L) 11/03/2023 0750   HCT 34.8 (L) 11/03/2023 0750   PLT 392 11/03/2023 0750   MCV 102.1 (H) 11/03/2023 0750   MCH 31.7 11/03/2023 0750   MCHC 31.0 11/03/2023 0750   RDW 14.9 11/03/2023 0750   LYMPHSABS 1.3 10/27/2023 1826   MONOABS 1.4 (H) 10/27/2023 1826   EOSABS 0.1 10/27/2023 1826   BASOSABS 0.1 10/27/2023 1826   BMET    Component Value Date/Time   NA 157 (H) 11/03/2023 0750   K 3.5 11/03/2023 0750   CL 126 (H) 11/03/2023 0750   CO2 21 (L) 11/03/2023 0750   GLUCOSE 152 (H) 11/03/2023 0750   BUN 38 (H) 11/03/2023 0750   CREATININE 1.38 (H) 11/03/2023 0750   CALCIUM 8.4 (L) 11/03/2023 0750   GFRNONAA >60 11/03/2023 0750   IMAGING past 24 hours No results found.  Vitals:   11/03/23 0700 11/03/23 0800 11/03/23 0900 11/03/23 1000  BP: 138/89 115/75 129/80 (!) 140/85  Pulse: 92 87 83 86  Resp: (!) 25 (!) 25 (!) 25 (!) 28  Temp:  100.3 F (37.9 C)     TempSrc:  Axillary    SpO2: 98% 99% 100% 99%  Weight:      Height:       PHYSICAL EXAM General: Intubated patient in no acute distress Psych: Unresponsive CV: Regular rate and rhythm on monitor Respiratory: Mechanically ventilated, on PS/CPAP  NEURO (off of sedation since 3/14):  Mental Status: Patient does not move eyes vertically to command.  Right eye has vertical nystagmus movements.  Left eye without vertical or horizontal eye movements.  He does not complete eye movements to command or to attempt to communicate this morning.  Negative doll's eyes.  Cranial Nerves:  II: Pupils pinpoint, nonreactive,  III, IV, VI: Eyes midline, dysconjugate at times, vertical nystagmus eye movements on the right but horizontal eye movements absent bilaterally.  Left eye with slight consistent downward deviation noted.  No blink to threat.  Spontaneous blinking movements on the right. V: Corneal intact on the right, absent on the left VII: Face appears symmetrical resting with ET tube present IX, X: Cough intact, gag absent XI: Head is midline XII: ETT in place Motor/Sensory: Extensor posturing movements with any stimulation worse on the right upper and lower extremity.  1-2 beats of nonsustained clonus on the left ankle, no clonus on the right with dorsiflexion.  Coordination: Deferred Gait: Deferred  ASSESSMENT/PLAN  [name] is a 48 y.o. male with history of HTN admitted for ICH.    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 Basal cisterns and fourth ventricle are effaced without hydrocephalus at this time.  CTA head & neck no LVO CT repeat 3/9 no substantial change 2D Echo EF 60 to 65%, moderate concentric LVH, grade 1 diastolic dysfunction, normal  left atrial size, no atrial level shunt LDL 96 HgbA1c 4.7 UDS positive for opiates VTE prophylaxis - SQ lovenox No antithrombotic prior to admission, now on No antithrombotic secondary to ICH Therapy recommendations:  Pending Disposition:  pending, palliative care consulted to discuss goals of care with family  Brainstem compression Neuro exam remains poor, given concern for edema in the brainstem and surrounding structures 3% hypertonic saline with 250 cc bolus and rate of 75 cc/h -> on hold Measure sodium every 6 hours, goal 150-155 3/14: Na 156 -> hypertonic on hold -> continue q6 checks Na 154> 159 > FWF added with subsequent Na of 157   Hypertension Home meds:  None Stable off Cleviprex On Norvasc, clonidine, hydralazine, losartan, Lopressor PRN labetalol  BP goal less than 160 Long-term BP goal normotensive  Hypoxic respiratory failure Aspiration PNA Intubated in the ED PCCM following SVT/VAP per CCM CXR- pending Tmax 101.5 overnight WBC 20.4-> 9.8 -> 13.2 ABx: Unasyn   Dysphagia Coretrak in place right nare On tube feeding and free water 200 cc Q4h  Substance use UDS positive for opioids Narcan given in ED  Other Active Problems Hypokalemia K2.6 -> replaced -> 4.2-> 3.3-> repleted -> 3.4->4.1-> 3.5 AKI, improving Cr 2.2-> 1.54 > 1.38 GFR 56 -> >60  Hospital day # 7  Patient seen and examined by NP/APP with MD. MD to update note as needed.   Garrett [name] , MSN, AGACNP-BC [facility] See Amion for schedule and pager information 11/03/2023 1:09 PM  ATTENDING NOTE: I reviewed above note and agree with the assessment and plan. Pt was seen and examined.   Wife is at the bedside and mom is on the phone. Pt still intubated off sedation, eyes closed, not following commands. With forced eye opening, eyes in mid position, right eye with intermittent vertical nystagmus, right eyelid spontaneous blinking, but left eye fixed and left corneal  reflex absent. B/l pupils pinpoint. Corneal reflex present on the right, gag and cough present. Breathing over the vent.  Facial symmetry not able to test due to ET tube.  Tongue protrusion not cooperative. On pain stimulation, bilateral extension posturing right more than left. Bilaterally no babinski. Sensation, coordination and gait not tested.   Patient had overnight fever, concerning for pneumonia, continue Unasyn, repeat chest x-ray.  Sodium 139, continue IV fluid and add free water.  BP under control with 5 BP meds, prognosis poor, discussed with family.  Continue current management.  For detailed assessment and plan, please refer to above/below as I have made changes wherever appropriate.   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 This patient's care requires constant monitoring of vital signs, hemodynamics, respiratory and cardiac monitoring, review of multiple databases, neurological assessment, discussion with family, other specialists and  medical decision making of high complexity. I spent 45 minutes of neurocritical care time in the care of this patient. I had long discussion with wife at bedside and mom over the phone, updated pt current condition, treatment plan and potential prognosis, and answered all the questions.  They expressed understanding and appreciation.      To contact Stroke Continuity provider, please refer to [email or web address]. After hours, contact General Neurology

## 2023-11-03 NOTE — Progress Notes (Signed)
 eLink Physician-Brief Progress Note Patient Name: Garrett [name] DOB: 1976/08/03 MRN: [id number]   Date of Service  11/03/2023  HPI/Events of Note  Status post 3 intermittent caths, anticipating transition to comfort  eICU Interventions  Insert Foley cath     Intervention Category Intermediate Interventions: Oliguria - evaluation and management  [employer] 11/03/2023, 5:38 AM

## 2023-11-03 NOTE — Plan of Care (Signed)
 Palliative-   Chart reviewed. Patient with locked in syndrome.  Family requested no further GOC discussion over weekend due to attending funeral.  PMT will followup on Monday.   Ocie Bob, AGNP-C Palliative Medicine  No charge

## 2023-11-03 NOTE — Progress Notes (Signed)
 NAME:  Garrett [name], MRN:  [id number], DOB:  05-07-1976, LOS: 7 ADMISSION DATE:  10/27/2023, CONSULTATION DATE:  10/27/2023 REFERRING MD:  Coralee Pesa, DO CHIEF COMPLAINT:  Found Down  History of Present Illness:   48 y/o male with h/o HTN who was found down for unknown downtime by his [place] when she came home from work, he was having agonal breathing.  He apparently had driven her to work this morning.  He has HTN but never checks his BP and does not follow with MD.  She says you can tell when his BP is really high; his eyes get blood shot and he is sweating.  No h/o seizures.  Besides almost daily [name] and cigarette smoking, she is not aware of any other drugs. Drug screen positive for opioids and he was given several doses of Narcan. Patient found to have:  1. 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 Basal cisterns and fourth ventricle are effaced without hydrocephalus at this time. 2. Trace additional subarachnoid hemorrhage along the left parietal convexity. BP in ED 262/156. His cxr revealed a RUL and perihilar infiltrates suggesting aspiration pneumonia. ED labs also revealed PH 7.169, LA 4.4, CPK 985. Patient was intubated in ED.  Pertinent  Medical History  HTN  Significant Hospital Events: Including procedures, antibiotic start and stop dates in addition to other pertinent events   3/8: Intubated in ED, pontine bleed/SAH. CT head 17:09 Acute large hemorrhage 4.1 cm in pons with intraventricular extension without hydrocephalus 3/9: CTA 03:14 AM No change in IPH in pons, similar biparietal [facility] 3/14: Now awake, appears locked in can follow commands with eyes.  Interim History / Subjective:  Off sedation, was able to follow commands with eyes yesterday.  More somnolent today.  Objective   Blood pressure (!) 140/85, pulse 86, temperature 100.3 F (37.9  C), temperature source Axillary, resp. rate (!) 28, height 5' 7.99" (1.727 m), weight 107.3 kg, SpO2 99%.    Vent Mode: PRVC FiO2 (%):  [30 %] 30 % Set Rate:  [24 bmp-25 bmp] 24 bmp Vt Set:  [550 mL] 550 mL PEEP:  [8 cmH20] 8 cmH20 Pressure Support:  [10 cmH20] 10 cmH20 Plateau Pressure:  [20 cmH20-23 cmH20] 23 cmH20   Intake/Output Summary (Last 24 hours) at 11/03/2023 1046 Last data filed at 11/03/2023 1000 Gross per 24 hour  Intake 1177.32 ml  Output 2460 ml  Net -1282.68 ml   Filed Weights   11/01/23 0500 11/02/23 0500 11/03/23 0500  Weight: 106 kg 107.5 kg 107.3 kg   Physical Exam: Gen:      No acute distress, obese. HEENT:  EOMI, sclera anicteric Neck:     ET tube, small bore feeding tube in place Lungs:    Lungs clear.  Tolerating SBT. CV:         Normal heart sounds. Abd:      Abdomen soft.,  Bowel sounds present.  FMS in place. Ext:    Mild generalized edema. Skin:      Warm and dry; no rash Neuro: More somnolent today.  Not following commands eyes closed.  Ancillary Tests Personally Reviewed:  Hypernatremia 157 Creatinine 1. 38 CT head on 3/9 shows extensive hemorrhage involving better part of the brainstem.  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  AKI-improving  Plan:   - Continue mechanical ventilation and leave on PSV to help with synchrony.  but mental status precludes safe extubation.  -iatrogenic hypernatremia.  Now off 3% saline.  Consult may now be contributing to somnolence today.  Start free water. - Keep off all sedation.  - Target SBP 130-150. On enteral agents only.  Will diurese today. 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   Best Practice (right click and "Reselect all SmartList Selections" daily)   Diet/type: tubefeeds DVT prophylaxis LMWH Pressure  ulcer(s): N/A GI prophylaxis: H2B Lines: N/A Foley:  external catheter.  Code Status:  DNR with pre-arrest interventions   CRITICAL CARE Performed by: Lynnell Catalan   Total critical care time: 35 minutes  Critical care time was exclusive of separately billable procedures and treating other patients.  Critical care was necessary to treat or prevent imminent or life-threatening deterioration.  Critical care was time spent personally by me on the following activities: development of treatment plan with patient and/or surrogate as well as nursing, discussions with consultants, evaluation of patient's response to treatment, examination of patient, obtaining history from patient or surrogate, ordering and performing treatments and interventions, ordering and review of laboratory studies, ordering and review of radiographic studies, pulse oximetry, re-evaluation of patient's condition and participation in multidisciplinary rounds.  Lynnell Catalan, MD [facility] ICU Physician [facility] [place] Critical Care  Pager: [phone] Mobile: [phone] After hours: [phone].

## 2023-11-04 DIAGNOSIS — J9601 Acute respiratory failure with hypoxia: Secondary | ICD-10-CM | POA: Diagnosis not present

## 2023-11-04 DIAGNOSIS — I619 Nontraumatic intracerebral hemorrhage, unspecified: Secondary | ICD-10-CM | POA: Diagnosis not present

## 2023-11-04 DIAGNOSIS — G935 Compression of brain: Secondary | ICD-10-CM | POA: Diagnosis not present

## 2023-11-04 DIAGNOSIS — J690 Pneumonitis due to inhalation of food and vomit: Secondary | ICD-10-CM | POA: Diagnosis not present

## 2023-11-04 DIAGNOSIS — I613 Nontraumatic intracerebral hemorrhage in brain stem: Secondary | ICD-10-CM | POA: Diagnosis not present

## 2023-11-04 DIAGNOSIS — N179 Acute kidney failure, unspecified: Secondary | ICD-10-CM | POA: Diagnosis not present

## 2023-11-04 DIAGNOSIS — I10 Essential (primary) hypertension: Secondary | ICD-10-CM | POA: Diagnosis not present

## 2023-11-04 LAB — CBC
HCT: 39.7 % (ref 39.0–52.0)
Hemoglobin: 11.7 g/dL — ABNORMAL LOW (ref 13.0–17.0)
MCH: 30.9 pg (ref 26.0–34.0)
MCHC: 29.5 g/dL — ABNORMAL LOW (ref 30.0–36.0)
MCV: 104.7 fL — ABNORMAL HIGH (ref 80.0–100.0)
Platelets: 435 10*3/uL — ABNORMAL HIGH (ref 150–400)
RBC: 3.79 MIL/uL — ABNORMAL LOW (ref 4.22–5.81)
RDW: 14.8 % (ref 11.5–15.5)
WBC: 18.1 10*3/uL — ABNORMAL HIGH (ref 4.0–10.5)
nRBC: 0 % (ref 0.0–0.2)

## 2023-11-04 LAB — URINALYSIS, ROUTINE W REFLEX MICROSCOPIC
Bilirubin Urine: NEGATIVE
Glucose, UA: NEGATIVE mg/dL
Hgb urine dipstick: NEGATIVE
Ketones, ur: NEGATIVE mg/dL
Leukocytes,Ua: NEGATIVE
Nitrite: NEGATIVE
Protein, ur: NEGATIVE mg/dL
Specific Gravity, Urine: 1.021 (ref 1.005–1.030)
pH: 5 (ref 5.0–8.0)

## 2023-11-04 LAB — BASIC METABOLIC PANEL
Anion gap: 11 (ref 5–15)
BUN: 39 mg/dL — ABNORMAL HIGH (ref 6–20)
CO2: 22 mmol/L (ref 22–32)
Calcium: 8.6 mg/dL — ABNORMAL LOW (ref 8.9–10.3)
Chloride: 125 mmol/L — ABNORMAL HIGH (ref 98–111)
Creatinine, Ser: 1.37 mg/dL — ABNORMAL HIGH (ref 0.61–1.24)
GFR, Estimated: >60 mL/min (ref >60)
Glucose, Bld: 112 mg/dL — ABNORMAL HIGH (ref 70–99)
Potassium: 3.8 mmol/L (ref 3.5–5.1)
Sodium: 158 mmol/L — ABNORMAL HIGH (ref 135–145)

## 2023-11-04 LAB — GLUCOSE, CAPILLARY
Glucose-Capillary: 117 mg/dL — ABNORMAL HIGH (ref 70–99)
Glucose-Capillary: 126 mg/dL — ABNORMAL HIGH (ref 70–99)
Glucose-Capillary: 135 mg/dL — ABNORMAL HIGH (ref 70–99)
Glucose-Capillary: 171 mg/dL — ABNORMAL HIGH (ref 70–99)
Glucose-Capillary: 128 mg/dL — ABNORMAL HIGH (ref 70–99)
Glucose-Capillary: 121 mg/dL — ABNORMAL HIGH (ref 70–99)

## 2023-11-04 MED ORDER — CHLORTHALIDONE 25 MG PO TABS
25.0000 mg | ORAL_TABLET | Freq: Every day | ORAL | Status: DC
  Filled 2023-11-04: qty 1

## 2023-11-04 MED ORDER — FENTANYL CITRATE PF 50 MCG/ML IJ SOSY: 200.0000 ug | PREFILLED_SYRINGE | Freq: Once | INTRAMUSCULAR | Status: DC

## 2023-11-04 MED ORDER — METOPROLOL TARTRATE 50 MG PO TABS
100.0000 mg | ORAL_TABLET | Freq: Two times a day (BID) | ORAL | Status: DC
  Administered 2023-11-04 – 2024-02-25 (×216): 100 mg
  Filled 2023-11-04 (×30): qty 2
  Filled 2023-11-04: qty 4
  Filled 2023-11-04 (×171): qty 2
  Filled 2023-11-04: qty 4
  Filled 2023-11-04 (×24): qty 2

## 2023-11-04 MED ORDER — ETOMIDATE 2 MG/ML IV SOLN
20.0000 mg | Freq: Once | INTRAVENOUS | Status: DC
Start: 2023-11-04 — End: 2023-11-04

## 2023-11-04 MED ORDER — FREE WATER
200.0000 mL | Status: DC
  Administered 2023-11-04 – 2023-11-05 (×14): 200 mL

## 2023-11-04 MED ORDER — ROCURONIUM BROMIDE 10 MG/ML (PF) SYRINGE: 100.0000 mg | PREFILLED_SYRINGE | Freq: Once | INTRAVENOUS | Status: DC

## 2023-11-04 MED ORDER — IRBESARTAN 150 MG PO TABS
300.0000 mg | ORAL_TABLET | Freq: Every day | ORAL | Status: DC
Start: 2023-11-04 — End: 2023-11-04
  Administered 2023-11-04: 300 mg via ORAL
  Filled 2023-11-04: qty 2

## 2023-11-04 MED ORDER — MIDAZOLAM HCL 2 MG/2ML IJ SOLN: 5.0000 mg | Freq: Once | INTRAMUSCULAR | Status: DC

## 2023-11-04 MED ORDER — IRBESARTAN 150 MG PO TABS: 300.0000 mg | ORAL_TABLET | Freq: Every day | ORAL | Status: DC

## 2023-11-04 MED ORDER — LIDOCAINE-EPINEPHRINE 1 %-1:100000 IJ SOLN
20.0000 mL | Freq: Once | INTRAMUSCULAR | Status: DC
Start: 2023-11-04 — End: 2023-11-04

## 2023-11-04 MED ORDER — CHLORTHALIDONE 25 MG PO TABS
25.0000 mg | ORAL_TABLET | Freq: Every day | ORAL | Status: DC
  Administered 2023-11-04: 25 mg via ORAL
  Filled 2023-11-04: qty 1

## 2023-11-04 NOTE — Progress Notes (Signed)
 NAME:  Garrett [name], MRN:  [id number], DOB:  1975-09-01, LOS: 8 ADMISSION DATE:  10/27/2023, CONSULTATION DATE:  10/27/2023 REFERRING MD:  Coralee Pesa, DO CHIEF COMPLAINT:  Found Down  History of Present Illness:   48 y/o male with h/o HTN who was found down for unknown downtime by his [place] when she came home from work, he was having agonal breathing.  He apparently had driven her to work this morning.  He has HTN but never checks his BP and does not follow with MD.  She says you can tell when his BP is really high; his eyes get blood shot and he is sweating.  No h/o seizures.  Besides almost daily [name] and cigarette smoking, she is not aware of any other drugs. Drug screen positive for opioids and he was given several doses of Narcan. Patient found to have:  1. 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 Basal cisterns and fourth ventricle are effaced without hydrocephalus at this time. 2. Trace additional subarachnoid hemorrhage along the left parietal convexity. BP in ED 262/156. His cxr revealed a RUL and perihilar infiltrates suggesting aspiration pneumonia. ED labs also revealed PH 7.169, LA 4.4, CPK 985. Patient was intubated in ED.  Pertinent  Medical History  HTN  Significant Hospital Events: Including procedures, antibiotic start and stop dates in addition to other pertinent events   3/8: Intubated in ED, pontine bleed/SAH. CT head 17:09 Acute large hemorrhage 4.1 cm in pons with intraventricular extension without hydrocephalus 3/9: CTA 03:14 AM No change in IPH in pons, similar biparietal [facility] 3/14: Now awake, appears locked in can follow commands with eyes.  Interim History / Subjective:  Remains off sedation.  Purposeful with left upper extremity particular yesterday evening.  Less so today.  Objective   Blood pressure (!) 159/91, pulse 88,  temperature (!) 100.4 F (38 C), temperature source Axillary, resp. rate (!) 30, height 5' 7.99" (1.727 m), weight 104.8 kg, SpO2 94%.    Vent Mode: PRVC FiO2 (%):  [30 %] 30 % Set Rate:  [24 bmp] 24 bmp Vt Set:  [550 mL] 550 mL PEEP:  [5 cmH20-8 cmH20] 5 cmH20 Pressure Support:  [10 cmH20] 10 cmH20 Plateau Pressure:  [19 cmH20-23 cmH20] 19 cmH20   Intake/Output Summary (Last 24 hours) at 11/04/2023 1010 Last data filed at 11/04/2023 0810 Gross per 24 hour  Intake 2410 ml  Output 3325 ml  Net -915 ml   Filed Weights   11/02/23 0500 11/03/23 0500 11/04/23 0500  Weight: 107.5 kg 107.3 kg 104.8 kg   Physical Exam: Gen:      No acute distress, obese. HEENT:  EOMI, sclera anicteric Neck:     ET tube, small bore feeding tube in place Lungs:    Lungs clear.  Tolerating SBT. CV:         Normal heart sounds. Abd:      Abdomen soft.,  Bowel sounds present.  FMS in place, stool consistency improving Ext:    Mild generalized edema. Skin:      Warm and dry; no rash Neuro: Eyes opening to voice this morning.  Weakly following commands in the left upper extremity.  Withdraws to pain in both lowers.  Extensor posturing right upper.  Ancillary Tests Personally Reviewed:  Hypernatremia 158 Creatinine 1. 37 CT head on 3/9 shows extensive hemorrhage involving better part of the brainstem.  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  AKI-improving  Plan:   - Continue mechanical ventilation and leave on PSV to help with synchrony.  but mental status precludes safe extubation.  -iatrogenic hypernatremia.  Now off 3% saline.  Exam may improve further once hyponatremia is corrected.  Have increased free water. - Keep off all sedation if at all possible as patient has made considerable progress..  - Target SBP 130-150.  Control is still  marginal.  Have stopped clonidine as this may be sedating and switched losartan to a more potent irbesartan and started chlorthalidone.  Metoprolol also increased. -Showing signs of improvement despite evidence of extensive stroke on MRI.  Recovery is likely to be partial but given patient's young age, it may be worth giving him more time to see if a reasonable functional recovery is possible.  Best Practice (right click and "Reselect all SmartList Selections" daily)   Diet/type: tubefeeds DVT prophylaxis LMWH Pressure ulcer(s): N/A GI prophylaxis: H2B Lines: N/A Foley:  external catheter.  Code Status:  DNR with pre-arrest interventions   CRITICAL CARE Performed by: Lynnell Catalan   Total critical care time: 35 minutes  Critical care time was exclusive of separately billable procedures and treating other patients.  Critical care was necessary to treat or prevent imminent or life-threatening deterioration.  Critical care was time spent personally by me on the following activities: development of treatment plan with patient and/or surrogate as well as nursing, discussions with consultants, evaluation of patient's response to treatment, examination of patient, obtaining history from patient or surrogate, ordering and performing treatments and interventions, ordering and review of laboratory studies, ordering and review of radiographic studies, pulse oximetry, re-evaluation of patient's condition and participation in multidisciplinary rounds.  Lynnell Catalan, MD [facility] ICU Physician [facility]  Critical Care  Pager: [phone] Mobile: [phone] After hours: [phone].

## 2023-11-04 NOTE — Plan of Care (Signed)
 Sedation remains off. Patient was able to give thumbs up, minimally and intermittently will respond to commands (waxes and wanes). Will tap on the bed with left arm, no other purposeful movement noted. PRN Fentanyl given frequently due to patient agitated and biting ETT. Partner at bedside updated on plan of care, seems hopeful but anxious. ICU status maintained.   Problem: Coping: Goal: Level of anxiety will decrease 11/04/2023 0624 by Charmian Muff, RN Outcome: Not Progressing   Problem: Nutritional: Goal: Maintenance of adequate nutrition will improve 11/04/2023 0624 by Charmian Muff, RN Outcome: Progressing

## 2023-11-04 NOTE — Progress Notes (Addendum)
 STROKE TEAM PROGRESS NOTE   INTERIM HISTORY/SUBJECTIVE Patient is seen in his room with 1 family member at the bedside.  He is able to inconsistently follow commands with his left upper extremity and can raise and lower his eyes to command.  Some purposeful movement of left upper extremity noted.  He has remained hemodynamically stable with low-grade fever, chest x-ray shows no acute abnormalities, UA pending.  OBJECTIVE CBC    Component Value Date/Time   WBC 18.1 (H) 11/04/2023 0453   RBC 3.79 (L) 11/04/2023 0453   HGB 11.7 (L) 11/04/2023 0453   HCT 39.7 11/04/2023 0453   PLT 435 (H) 11/04/2023 0453   MCV 104.7 (H) 11/04/2023 0453   MCH 30.9 11/04/2023 0453   MCHC 29.5 (L) 11/04/2023 0453   RDW 14.8 11/04/2023 0453   LYMPHSABS 1.3 10/27/2023 1826   MONOABS 1.4 (H) 10/27/2023 1826   EOSABS 0.1 10/27/2023 1826   BASOSABS 0.1 10/27/2023 1826   BMET    Component Value Date/Time   NA 158 (H) 11/04/2023 0453   K 3.8 11/04/2023 0453   CL 125 (H) 11/04/2023 0453   CO2 22 11/04/2023 0453   GLUCOSE 112 (H) 11/04/2023 0453   BUN 39 (H) 11/04/2023 0453   CREATININE 1.37 (H) 11/04/2023 0453   CALCIUM 8.6 (L) 11/04/2023 0453   GFRNONAA >60 11/04/2023 0453   IMAGING past 24 hours DG CHEST PORT 1 VIEW Result Date: 11/03/2023 CLINICAL DATA:  Fever EXAM: PORTABLE CHEST 1 VIEW COMPARISON:  10/27/2023 FINDINGS: Endotracheal tube is 5 cm above the carina. Feeding tube loops in the stomach with the tip in the fundus. Heart and mediastinal contours are within normal limits. No focal opacities or effusions. No acute bony abnormality. IMPRESSION: No active cardiopulmonary disease. Feeding tube tip in the fundus of the stomach. Electronically Signed   By: Charlett Nose M.D.   On: 11/03/2023 19:24    Vitals:   11/04/23 1015 11/04/23 1100 11/04/23 1137 11/04/23 1200  BP: (!) 145/85 (!) 157/103  (!) 139/94  Pulse: 99 93  87  Resp: (!) 26 (!) 28  (!) 26  Temp:   100.2 F (37.9 C)   TempSrc:    Axillary   SpO2: 96% 99%  98%  Weight:      Height:       PHYSICAL EXAM General: Intubated patient in no acute distress CV: Regular rate and rhythm on monitor Respiratory: Mechanically ventilated, on PS/CPAP  NEURO (off of sedation since 3/14):  Mental Status: Patient will move eyes vertically to command.  He will follow commands intermittently with left upper extremity and does display some purposeful movement Cranial Nerves:  II: Pupils pinpoint, nonreactive,  III, IV, VI: Eyes midline, dysconjugate at times, and horizontal eye movements absent bilaterally.  No nystagmus present today.  Left eye with slight consistent downward deviation noted.  No blink to threat.  Spontaneous blinking movements on the right. V: Corneal intact on the right, absent on the left VII: Face appears symmetrical resting with ET tube present IX, X: Cough intact, gag absent XI: Head is midline XII: ETT in place Motor/Sensory: Some purposeful movement of left upper extremity noted, and patient is able to give thumbs up with his left hand intermittently.  He does withdraw left lower extremity to noxious, extends right lower extremity to noxious and does not move right upper extremity Coordination: Deferred Gait: Deferred  ASSESSMENT/PLAN  Mr. Jaxden Blyden is a 48 y.o. male with history of HTN admitted for ICH.  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 Basal cisterns and fourth ventricle are effaced without hydrocephalus at this time.  CTA head & neck no LVO CT repeat 3/9 no substantial change 2D Echo EF 60 to 65%, moderate concentric LVH, grade 1 diastolic dysfunction, normal left atrial size, no atrial level shunt LDL 96 HgbA1c 4.7 UDS positive for opiates VTE  prophylaxis - SQ lovenox No antithrombotic prior to admission, now on No antithrombotic secondary to ICH Therapy recommendations:  Pending Disposition:  pending, palliative care on board  Brainstem compression Neuro exam remains poor, given concern for edema in the brainstem and surrounding structures 3% hypertonic saline with 250 cc bolus and rate of 75 cc/h -> on hold Measure sodium every 6 hours, goal 150-155 Na q6 checks Na 154> 159 >157 >158  Hypertension Home meds:  None Stable off Cleviprex On Norvasc, clonidine, hydralazine, losartan, Lopressor PRN labetalol  BP goal less than 160 Long-term BP goal normotensive  Hypoxic respiratory failure Aspiration PNA Intubated in the ED PCCM following SVT/VAP per CCM CXR-no acute abnormalities Tmax 101.5->100.4 WBC 20.4-> 9.8 -> 13.2-> 18.1 ABx: Unasyn   Dysphagia Coretrak in place right nare On tube feeding and free water 200 cc Q4h  Substance use UDS positive for opioids Narcan given in ED  Other Active Problems Hypokalemia K2.6 -> replaced -> 4.2-> 3.3-> repleted -> 3.4->4.1-> 3.5-> 3.8 AKI, improving Cr 2.2-> 1.54 > 1.38> 1.37 BMP monitoring  Hospital day # 8  Patient seen and examined by NP/APP with MD. MD to update note as needed.   Cortney E Ernestina Columbia , MSN, AGACNP-BC [facility] See Amion for schedule and pager information 11/04/2023 12:33 PM  ATTENDING NOTE: I reviewed above note and agree with the assessment and plan. Pt was seen and examined.   Wife at bedside.  Patient still has eyes closed but able to open and end of the encounter with pain stimulation.  Seems more awake alert than yesterday, able to move both eyes vertically, more on the right.  Left corneal negative, right corneal weakly present.  Positive gag and cough.  Left upper extremity intermittently follow commands, however able to have spontaneous finger movement and bicep 3 -/5.  Right upper extremity extension posturing on  stimulation.  Bilateral extremity withdraw to pain still lesion left more than right.  To have low-grade fever and leukocytosis, Unasyn, creatinine improved and stable.  Continue tube feeding and free water.  Discussed with CCM Dr. Denese Killings, continue supportive care.  Will give more time for decision on extubating versus tracheostomy.  For detailed assessment and plan, please refer to above/below as I have made changes wherever appropriate.   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 This patient's care requires constant monitoring of vital signs, hemodynamics, respiratory and cardiac monitoring, review of multiple databases, neurological assessment, discussion with family, other specialists and medical decision making of high complexity. I spent 40 minutes of neurocritical care time in the care of this patient. I had long discussion with wife at bedside, updated pt current condition, treatment plan and potential prognosis, and answered all the questions.  She expressed understanding and  appreciation.    To contact Stroke Continuity provider, please refer to [email or web address]. After hours, contact General Neurology

## 2023-11-05 ENCOUNTER — Inpatient Hospital Stay (HOSPITAL_COMMUNITY): Payer: MEDICAID

## 2023-11-05 DIAGNOSIS — Z515 Encounter for palliative care: Secondary | ICD-10-CM | POA: Diagnosis not present

## 2023-11-05 DIAGNOSIS — J690 Pneumonitis due to inhalation of food and vomit: Secondary | ICD-10-CM | POA: Diagnosis not present

## 2023-11-05 DIAGNOSIS — I10 Essential (primary) hypertension: Secondary | ICD-10-CM | POA: Diagnosis not present

## 2023-11-05 DIAGNOSIS — I613 Nontraumatic intracerebral hemorrhage in brain stem: Secondary | ICD-10-CM | POA: Diagnosis not present

## 2023-11-05 DIAGNOSIS — G935 Compression of brain: Secondary | ICD-10-CM | POA: Diagnosis not present

## 2023-11-05 DIAGNOSIS — I619 Nontraumatic intracerebral hemorrhage, unspecified: Secondary | ICD-10-CM | POA: Diagnosis not present

## 2023-11-05 DIAGNOSIS — I161 Hypertensive emergency: Secondary | ICD-10-CM | POA: Diagnosis not present

## 2023-11-05 DIAGNOSIS — J9601 Acute respiratory failure with hypoxia: Secondary | ICD-10-CM | POA: Diagnosis not present

## 2023-11-05 LAB — CBC
HCT: 36.7 % — ABNORMAL LOW (ref 39.0–52.0)
Hemoglobin: 11.1 g/dL — ABNORMAL LOW (ref 13.0–17.0)
MCH: 31.1 pg (ref 26.0–34.0)
MCHC: 30.2 g/dL (ref 30.0–36.0)
MCV: 102.8 fL — ABNORMAL HIGH (ref 80.0–100.0)
Platelets: 433 10*3/uL — ABNORMAL HIGH (ref 150–400)
RBC: 3.57 MIL/uL — ABNORMAL LOW (ref 4.22–5.81)
RDW: 14.5 % (ref 11.5–15.5)
WBC: 14.1 10*3/uL — ABNORMAL HIGH (ref 4.0–10.5)
nRBC: 0 % (ref 0.0–0.2)

## 2023-11-05 LAB — BASIC METABOLIC PANEL
Anion gap: 7 (ref 5–15)
BUN: 43 mg/dL — ABNORMAL HIGH (ref 6–20)
CO2: 22 mmol/L (ref 22–32)
Calcium: 7.9 mg/dL — ABNORMAL LOW (ref 8.9–10.3)
Chloride: 122 mmol/L — ABNORMAL HIGH (ref 98–111)
Creatinine, Ser: 1.71 mg/dL — ABNORMAL HIGH (ref 0.61–1.24)
GFR, Estimated: 49 mL/min — ABNORMAL LOW (ref >60)
Glucose, Bld: 127 mg/dL — ABNORMAL HIGH (ref 70–99)
Potassium: 3.7 mmol/L (ref 3.5–5.1)
Sodium: 151 mmol/L — ABNORMAL HIGH (ref 135–145)

## 2023-11-05 LAB — GLUCOSE, CAPILLARY
Glucose-Capillary: 104 mg/dL — ABNORMAL HIGH (ref 70–99)
Glucose-Capillary: 128 mg/dL — ABNORMAL HIGH (ref 70–99)
Glucose-Capillary: 162 mg/dL — ABNORMAL HIGH (ref 70–99)
Glucose-Capillary: 159 mg/dL — ABNORMAL HIGH (ref 70–99)
Glucose-Capillary: 114 mg/dL — ABNORMAL HIGH (ref 70–99)
Glucose-Capillary: 127 mg/dL — ABNORMAL HIGH (ref 70–99)

## 2023-11-05 MED ORDER — POTASSIUM CHLORIDE 20 MEQ PO PACK
40.0000 meq | PACK | Freq: Once | ORAL | Status: AC
  Administered 2023-11-05: 40 meq
  Filled 2023-11-05: qty 2

## 2023-11-05 MED ORDER — FREE WATER
200.0000 mL | Status: DC
  Administered 2023-11-05 – 2023-11-09 (×28): 200 mL

## 2023-11-05 MED ORDER — CLONIDINE HCL 0.1 MG PO TABS
0.1000 mg | ORAL_TABLET | Freq: Three times a day (TID) | ORAL | Status: DC
Start: 2023-11-05 — End: 2023-11-08
  Administered 2023-11-05 – 2023-11-07 (×9): 0.1 mg
  Filled 2023-11-05 (×10): qty 1

## 2023-11-05 NOTE — Progress Notes (Addendum)
 STROKE TEAM PROGRESS NOTE   INTERIM HISTORY/SUBJECTIVE Family at the bedside.  Patient remains intubated, on no sedation, more lethargic tody.  T max overnight 102.1, WBC 14.1, NA 151. BUN 43, Cr 1.71.  We will obtain CT head today and CXR. Also will decrease FWF to 200cc Q4h.   Exam today: Lethargic, eyes are closed, not opening to voice or stimuli, not following commands. no blink to threat bilaterally. Pupils equal and reactive, Right corneal reflex intact, absent Left corneal reflex. No response to painful stimuli in extremities   OBJECTIVE CBC    Component Value Date/Time   WBC 14.1 (H) 11/05/2023 0643   RBC 3.57 (L) 11/05/2023 0643   HGB 11.1 (L) 11/05/2023 0643   HCT 36.7 (L) 11/05/2023 0643   PLT 433 (H) 11/05/2023 0643   MCV 102.8 (H) 11/05/2023 0643   MCH 31.1 11/05/2023 0643   MCHC 30.2 11/05/2023 0643   RDW 14.5 11/05/2023 0643   LYMPHSABS 1.3 10/27/2023 1826   MONOABS 1.4 (H) 10/27/2023 1826   EOSABS 0.1 10/27/2023 1826   BASOSABS 0.1 10/27/2023 1826   BMET    Component Value Date/Time   NA 151 (H) 11/05/2023 0643   K 3.7 11/05/2023 0643   CL 122 (H) 11/05/2023 0643   CO2 22 11/05/2023 0643   GLUCOSE 127 (H) 11/05/2023 0643   BUN 43 (H) 11/05/2023 0643   CREATININE 1.71 (H) 11/05/2023 0643   CALCIUM 7.9 (L) 11/05/2023 0643   GFRNONAA 49 (L) 11/05/2023 0643   IMAGING past 24 hours No results found.   Vitals:   11/05/23 0500 11/05/23 0600 11/05/23 0700 11/05/23 0800  BP: 112/72 102/63 (!) 94/54 122/70  Pulse: 77 73 63 74  Resp: (!) 21 (!) 24 (!) 24 (!) 27  Temp:    98.7 F (37.1 C)  TempSrc:    Oral  SpO2: 97% 99% 100% 94%  Weight:      Height:       PHYSICAL EXAM General: Intubated patient in no acute distress CV: Regular rate and rhythm on monitor Respiratory: Mechanically ventilated,  NEURO (off of sedation since 3/14):  Mental Status: Lethargic, not following commands.  Cranial Nerves:  II: Pupils pinpoint, nonreactive,  III, IV, VI:  Eyes midline, no blink to threat bilaterally,not moving eyes today V: Corneal intact on the right, absent on the left VII: Face appears symmetrical resting with ET tube present IX, X: Cough intact, gag absent XI: Head is midline XII: ETT in place Motor/Sensory:no response to painful stimuli  Coordination: Deferred Gait: Deferred  ASSESSMENT/PLAN  Garrett [name] is a 48 y.o. male with history of HTN admitted for ICH.    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 Basal cisterns and fourth ventricle are effaced without hydrocephalus at this time.  CTA head & neck no LVO CT repeat 3/9 no substantial change Repeat CT head today  2D Echo EF 60 to 65%, moderate concentric LVH, grade 1 diastolic dysfunction, normal left atrial size, no atrial level shunt LDL 96 HgbA1c 4.7 UDS positive for opiates VTE prophylaxis - SQ lovenox No antithrombotic prior to admission, now on No antithrombotic secondary to ICH Therapy recommendations:  Pending Disposition:  pending, palliative care on board  Brainstem compression Neuro exam remains poor, given concern for edema in the brainstem and surrounding  structures 3% hypertonic off On FW 200 Q2->Q4 Na 154> 159 >157 >158->151  Hypertension Home meds:  None Stable off Cleviprex On Norvasc, clonidine, hydralazine, losartan, Lopressor PRN labetalol  BP goal less than 160 Long-term BP goal normotensive  Hypoxic respiratory failure Aspiration PNA Intubated in the ED PCCM following SVT/VAP per CCM CXR-no acute abnormalities Tmax 101.5->100.4->102.1 WBC 20.4-> 9.8 -> 13.2-> 18.1-14.1 Unasyn completed Will get CXR today  Dysphagia Coretrak in place right nare On tube feeding and free water 200 cc  Q4h  Substance use UDS positive for opioids Narcan given in ED  Other Active Problems Hypokalemia K2.6 -> replaced -> 4.2-> 3.3-> repleted -> 3.4->4.1-> 3.5-> 3.8 AKI,  Cr 2.2-> 1.54 > 1.38> 1.37-1.71 BMP monitoring  Hospital day # 9  Patient seen and examined by NP/APP with MD. MD to update note as needed.   Gevena Mart DNP, ACNPC-AG  [facility]  ATTENDING NOTE: I reviewed above note and agree with the assessment and plan. Pt was seen and examined.   Wife at the bedside. Today pt lethargic and less responsive, not following commands and not open eyes or move eyes per command. Overnight fever, will check CXR and repeat CT. Na 151 today, drop from 158, decrease FW to 200 Q4h. Cre elevated from yesterday, appreciate CCM assistance.   For detailed assessment and plan, please refer to above/below as I have made changes wherever appropriate.   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 This patient's care requires constant monitoring of vital signs, hemodynamics, respiratory and cardiac monitoring, review of multiple databases, neurological assessment, discussion with family, other specialists and medical decision making of high complexity. I spent 35 minutes of neurocritical care time in the care of this patient. I had long discussion with wife at bedside, updated pt current condition, treatment plan and potential prognosis, and answered all the questions.  She expressed understanding and appreciation.    To contact Stroke Continuity provider, please refer to [email or web address]. After hours, contact General Neurology

## 2023-11-05 NOTE — Progress Notes (Signed)
 NAME:  Garrett [name], MRN:  [id number], DOB:  1976-02-09, LOS: 9 ADMISSION DATE:  10/27/2023, CONSULTATION DATE:  10/27/2023 REFERRING MD:  Coralee Pesa, DO CHIEF COMPLAINT:  Found Down  History of Present Illness:   48 y/o male with h/o HTN who was found down for unknown downtime by his [place] when she came home from work, he was having agonal breathing.  He apparently had driven her to work this morning.  He has HTN but never checks his BP and does not follow with MD.  She says you can tell when his BP is really high; his eyes get blood shot and he is sweating.  No h/o seizures.  Besides almost daily [name] and cigarette smoking, she is not aware of any other drugs. Drug screen positive for opioids and he was given several doses of Narcan. Patient found to have:  1. 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 Basal cisterns and fourth ventricle are effaced without hydrocephalus at this time. 2. Trace additional subarachnoid hemorrhage along the left parietal convexity. BP in ED 262/156. His cxr revealed a RUL and perihilar infiltrates suggesting aspiration pneumonia. ED labs also revealed PH 7.169, LA 4.4, CPK 985. Patient was intubated in ED.  Pertinent  Medical History  HTN  Significant Hospital Events: Including procedures, antibiotic start and stop dates in addition to other pertinent events   3/8: Intubated in ED, pontine bleed/SAH. CT head 17:09 Acute large hemorrhage 4.1 cm in pons with intraventricular extension without hydrocephalus 3/9: CTA 03:14 AM No change in IPH in pons, similar biparietal [facility] 3/14: Now awake, appears locked in can follow commands with eyes. 3/16 Purposeful with left upper extremity   Interim History / Subjective:   Critically ill, intubated Febrile 102 max Normotensive, good urine output.   Objective   Blood pressure 122/70,  pulse 74, temperature 98.7 F (37.1 C), temperature source Oral, resp. rate (!) 27, height 5' 7.99" (1.727 m), weight 104.8 kg, SpO2 94%.    Vent Mode: PRVC FiO2 (%):  [30 %] 30 % Set Rate:  [24 bmp] 24 bmp Vt Set:  [550 mL] 550 mL PEEP:  [5 cmH20] 5 cmH20 Pressure Support:  [10 cmH20] 10 cmH20 Plateau Pressure:  [19 cmH20-26 cmH20] 19 cmH20   Intake/Output Summary (Last 24 hours) at 11/05/2023 0903 Last data filed at 11/05/2023 0700 Gross per 24 hour  Intake 2041 ml  Output 2000 ml  Net 41 ml   Filed Weights   11/02/23 0500 11/03/23 0500 11/04/23 0500  Weight: 107.5 kg 107.3 kg 104.8 kg   Physical Exam: Gen:      No acute distress, obese. HEENT:  EOMI, sclera anicteric Neck:     ET tube, small bore feeding tube in place Lungs:    Clear breath sounds bilateral, no accessory muscle use, on pressure support weaning CV:         Normal heart sounds. Abd:      Abdomen soft.,  Bowel sounds present.  FMS in place, stool consistency improving Ext:    Mild generalized edema. Skin:      Warm and dry; no rash Neuro: Did not follow commands for me, per RN purposeful with left upper extremity and flicker movement of left leg, right hemiplegia  Labs show decreasing hypernatremia 151, increasing creatinine 1.7, decreasing leukocytosis 14K  Ancillary Tests Personally Reviewed:   CT head on 3/9 shows extensive hemorrhage involving  better part of the brainstem.  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   - Keep off all sedation if at all possible  - Target SBP 130-150.  -Due to rising creatinine will DC irbesartan and chlorthalidone and add clonidine back given that this may cause slight sedation.  Try to minimize Ativan   Acute hypoxic respiratory failure due to aspiration pneumonia.    Plan:   - Continue spontaneous breathing trials, not ready for extubation yet due to mental status   AKI-improving -iatrogenic  hypernatremia.  - Now off 3% saline.  Correcting, continue free water -DC ARB    Summary -Showing signs of improvement despite evidence of extensive stroke on MRI.  Recovery is likely to be partial but given patient's young age, it may be worth giving him more time to see if a reasonable functional recovery is possible.  Remains to be seen whether he will extubate successfully or may still require a trach.  May plan on a trial of extubation if mental status continues to improve  Best Practice (right click and "Reselect all SmartList Selections" daily)   Diet/type: tubefeeds DVT prophylaxis LMWH Pressure ulcer(s): N/A GI prophylaxis: H2B Lines: N/A Foley:  external catheter.  Code Status:  DNR with pre-arrest interventions   CRITICAL CARE Performed by: Comer Locket Brandis Matsuura   Total critical care time: 33 minutes  Critical care time was exclusive of separately billable procedures and treating other patients.  Critical care was necessary to treat or prevent imminent or life-threatening deterioration.  Critical care was time spent personally by me on the following activities: development of treatment plan with patient and/or surrogate as well as nursing, discussions with consultants, evaluation of patient's response to treatment, examination of patient, obtaining history from patient or surrogate, ordering and performing treatments and interventions, ordering and review of laboratory studies, ordering and review of radiographic studies, pulse oximetry, re-evaluation of patient's condition and participation in multidisciplinary rounds.  Cyril Mourning MD. Tonny Bollman. [facility] Pager : [phone]  If no response to pager , please call [phone] until 7 pm After 7:00 pm call Elink  [phone]   11/05/2023

## 2023-11-05 NOTE — Progress Notes (Signed)
 Palliative Care Progress Note, Assessment & Plan   Patient Name: Garrett [name]       Date: 11/05/2023 DOB: [birth year]  Age: 48 y.o. MRN#: [id number] Attending Physician: Stroke, Md, MD Primary Care Physician: Pcp, No Admit Date: 10/27/2023  Subjective: Patient is lying in bed, intubated, off of sedation, and lethargic.  His eyes are closed, he is not responding to voice or stimuli, and he is unable to follow commands during my visit.  Neurology MD and NP, dayshift RN, and patient's significant other [name] are at bedside during my visit.  HPI: 48 y.o. male  with past medical history of hypertension who presented to the ED on 10/27/2023 after he was found down at home by his fiance. He was found to have an acute large hemorrhage centered in the pons with intraventricular extension in the fourth ventricle and also extension into the basal cisterns. Patient was intubated in the ED.    Palliative Medicine has been consulted for goals of care in the setting of "large ICH".  Summary of counseling/coordination of care: Extensive chart review completed prior to meeting patient including labs, vital signs, imaging, progress notes, orders, and available advanced directive documents from current and previous encounters.   After reviewing the patient's chart and assessing the patient at bedside, I spoke with patient's significant other and care team in regards to plan of care.   Patient was febrile overnight.  Chest x-ray ordered-results pending.  Plan remains to continue with supportive measures.  Space and opportunity provided for patient significant other to ask questions and voiced her concerns regarding patient's current medical situation.  She shares that he is "being funny" this morning by not responding to  her.  She is hopeful that once family is here later this afternoon that he will perk up.  Discussed that his significant brain bleed and brainstem injury would have shown improvement given that this is his eighth day from the incident.  She shares that she has seen steady improvement and remains hopeful that it will continue to show improvement.  Therapeutic silence, active listening, and emotional support provided.  Discussed that ventilatory support with endotracheal tube is temporary.  If patient is unable to be liberated from life support then the option is to provide a tracheostomy or to perform a one-way extubation to comfort.    She defers decisions to his parents, who are not available today as they are at an appointment in [place].  However, she shares that when she asked the patient earlier in the hospitalization what he would want-whether he wants to fight or if she should let him go-she says he confirmed to her that he wants to fight.    I highlighted that patient's family members have met and discussed that patient would never want a tracheostomy in the future.  She shares she is clear on that but that she knows he wants to fight.  She shares that his choice is to fight.  Highlighted that sometimes earthly bodies make decisions that may not be in line with what the mind and the spirit want. Reviewed faith in God, remaining hopeful but also realistic.   No adjustment to POC at this time.  PMT will continue to follow and support patient and family throughout his hospitalization.   Physical Exam Vitals reviewed.  Constitutional:      General: He is not in acute distress.    Appearance: He is obese. He is ill-appearing.  HENT:     Head: Normocephalic.     Nose: Nose normal.     Mouth/Throat:     Mouth: Mucous membranes are moist.  Eyes:     Comments: No corneal reflex on right  Cardiovascular:     Rate and Rhythm: Normal rate.     Pulses: Normal pulses.  Pulmonary:      Comments: MV Abdominal:     Palpations: Abdomen is soft.  Musculoskeletal:     Comments: Does not move any extremities to command             Total Time 35 minutes   Time spent includes: Detailed review of medical records (labs, imaging, vital signs), medically appropriate exam (mental status, respiratory, cardiac, skin), discussed with treatment team, counseling and educating patient, family and staff, documenting clinical information, medication management and coordination of care.  Samara Deist L. Bonita Quin, DNP, FNP-BC Palliative Medicine Team

## 2023-11-05 NOTE — Progress Notes (Signed)
Transported patient to C.T while patient was on the ventilator. Patient remained stable during transport.

## 2023-11-06 DIAGNOSIS — A419 Sepsis, unspecified organism: Secondary | ICD-10-CM | POA: Diagnosis not present

## 2023-11-06 DIAGNOSIS — I619 Nontraumatic intracerebral hemorrhage, unspecified: Secondary | ICD-10-CM | POA: Diagnosis not present

## 2023-11-06 DIAGNOSIS — J9601 Acute respiratory failure with hypoxia: Secondary | ICD-10-CM | POA: Diagnosis not present

## 2023-11-06 DIAGNOSIS — J690 Pneumonitis due to inhalation of food and vomit: Secondary | ICD-10-CM | POA: Diagnosis not present

## 2023-11-06 DIAGNOSIS — G935 Compression of brain: Secondary | ICD-10-CM | POA: Diagnosis not present

## 2023-11-06 DIAGNOSIS — Z7189 Other specified counseling: Secondary | ICD-10-CM | POA: Diagnosis not present

## 2023-11-06 DIAGNOSIS — I10 Essential (primary) hypertension: Secondary | ICD-10-CM | POA: Diagnosis not present

## 2023-11-06 DIAGNOSIS — I613 Nontraumatic intracerebral hemorrhage in brain stem: Secondary | ICD-10-CM | POA: Diagnosis not present

## 2023-11-06 DIAGNOSIS — I161 Hypertensive emergency: Secondary | ICD-10-CM | POA: Diagnosis not present

## 2023-11-06 LAB — CBC
HCT: 33.5 % — ABNORMAL LOW (ref 39.0–52.0)
Hemoglobin: 10.1 g/dL — ABNORMAL LOW (ref 13.0–17.0)
MCH: 31.2 pg (ref 26.0–34.0)
MCHC: 30.1 g/dL (ref 30.0–36.0)
MCV: 103.4 fL — ABNORMAL HIGH (ref 80.0–100.0)
Platelets: 385 10*3/uL (ref 150–400)
RBC: 3.24 MIL/uL — ABNORMAL LOW (ref 4.22–5.81)
RDW: 14.4 % (ref 11.5–15.5)
WBC: 15.5 10*3/uL — ABNORMAL HIGH (ref 4.0–10.5)
nRBC: 0 % (ref 0.0–0.2)

## 2023-11-06 LAB — GLUCOSE, CAPILLARY
Glucose-Capillary: 102 mg/dL — ABNORMAL HIGH (ref 70–99)
Glucose-Capillary: 108 mg/dL — ABNORMAL HIGH (ref 70–99)
Glucose-Capillary: 116 mg/dL — ABNORMAL HIGH (ref 70–99)
Glucose-Capillary: 118 mg/dL — ABNORMAL HIGH (ref 70–99)
Glucose-Capillary: 127 mg/dL — ABNORMAL HIGH (ref 70–99)
Glucose-Capillary: 138 mg/dL — ABNORMAL HIGH (ref 70–99)

## 2023-11-06 LAB — BASIC METABOLIC PANEL
Anion gap: 9 (ref 5–15)
BUN: 53 mg/dL — ABNORMAL HIGH (ref 6–20)
CO2: 23 mmol/L (ref 22–32)
Calcium: 8.3 mg/dL — ABNORMAL LOW (ref 8.9–10.3)
Chloride: 117 mmol/L — ABNORMAL HIGH (ref 98–111)
Creatinine, Ser: 1.56 mg/dL — ABNORMAL HIGH (ref 0.61–1.24)
GFR, Estimated: 55 mL/min — ABNORMAL LOW (ref >60)
Glucose, Bld: 140 mg/dL — ABNORMAL HIGH (ref 70–99)
Potassium: 3.7 mmol/L (ref 3.5–5.1)
Sodium: 149 mmol/L — ABNORMAL HIGH (ref 135–145)

## 2023-11-06 LAB — MAGNESIUM: Magnesium: 3 mg/dL — ABNORMAL HIGH (ref 1.7–2.4)

## 2023-11-06 LAB — PHOSPHORUS: Phosphorus: 4.2 mg/dL (ref 2.5–4.6)

## 2023-11-06 MED ORDER — FUROSEMIDE 10 MG/ML IJ SOLN
40.0000 mg | Freq: Once | INTRAMUSCULAR | Status: AC
  Administered 2023-11-06: 40 mg via INTRAVENOUS
  Filled 2023-11-06: qty 4

## 2023-11-06 MED ORDER — POTASSIUM CHLORIDE 20 MEQ PO PACK
40.0000 meq | PACK | Freq: Once | ORAL | Status: AC
  Administered 2023-11-06: 40 meq
  Filled 2023-11-06: qty 2

## 2023-11-06 NOTE — Progress Notes (Signed)
 NAME:  Garrett [name], MRN:  [id number], DOB:  [birth year], LOS: 10 ADMISSION DATE:  10/27/2023, CONSULTATION DATE:  10/27/2023 REFERRING MD:  Coralee Pesa, DO CHIEF COMPLAINT:  Found Down  History of Present Illness:   48 y/o male with h/o HTN who was found down for unknown downtime by his [place] when she came home from work, he was having agonal breathing.  He apparently had driven her to work this morning.  He has HTN but never checks his BP and does not follow with MD.  She says you can tell when his BP is really high; his eyes get blood shot and he is sweating.  No h/o seizures.  Besides almost daily [name] and cigarette smoking, she is not aware of any other drugs. Drug screen positive for opioids and he was given several doses of Narcan. Patient found to have:  1. 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 Basal cisterns and fourth ventricle are effaced without hydrocephalus at this time. 2. Trace additional subarachnoid hemorrhage along the left parietal convexity. BP in ED 262/156. His cxr revealed a RUL and perihilar infiltrates suggesting aspiration pneumonia. ED labs also revealed PH 7.169, LA 4.4, CPK 985. Patient was intubated in ED.  Pertinent  Medical History  HTN  Significant Hospital Events: Including procedures, antibiotic start and stop dates in addition to other pertinent events   3/8: Intubated in ED, pontine bleed/SAH. CT head 17:09 Acute large hemorrhage 4.1 cm in pons with intraventricular extension without hydrocephalus 3/9: CTA 03:14 AM No change in IPH in pons, similar biparietal [facility] 3/14: Now awake, appears locked in can follow commands with eyes. 3/16 Purposeful with left upper extremity   Interim History / Subjective:   Remains critically ill, intubated Low-grade febrile 100.9 Needing minimal sedation Good urine output   Objective    Blood pressure 139/85, pulse 69, temperature 98.8 F (37.1 C), temperature source Axillary, resp. rate (!) 22, height 5' 7.99" (1.727 m), weight 104.8 kg, SpO2 96%.    Vent Mode: PSV;CPAP FiO2 (%):  [30 %-40 %] 40 % Set Rate:  [18 bmp] 18 bmp Vt Set:  [550 mL] 550 mL PEEP:  [5 cmH20] 5 cmH20 Pressure Support:  [8 cmH20-10 cmH20] 8 cmH20 Plateau Pressure:  [19 cmH20-21 cmH20] 19 cmH20   Intake/Output Summary (Last 24 hours) at 11/06/2023 1001 Last data filed at 11/06/2023 0800 Gross per 24 hour  Intake 1170.58 ml  Output 1660 ml  Net -489.42 ml   Filed Weights   11/02/23 0500 11/03/23 0500 11/04/23 0500  Weight: 107.5 kg 107.3 kg 104.8 kg   Physical Exam: Gen:      No acute distress, obese. HEENT:  EOMI, sclera anicteric Neck:     ET tube, small bore feeding tube in place Lungs:    Bilateral ventilated breath sounds, no accessory muscle use, on pressure support weaning CV:         Normal heart sounds. Abd:      Abdomen soft.,  Bowel sounds present.  FMS in place, stool consistency improving Ext:    Mild generalized edema. Skin:      Warm and dry; no rash Neuro: Follows commands, moves left hand  and flicker movement of left leg, right hemiplegia  Labs show decreasing hypernatremia 149, stable leukocytosis, decreased creatinine to 1.5  Ancillary Tests Personally Reviewed:   CT head on 3/9 shows extensive hemorrhage involving better part  of the brainstem.  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   -Maintain minimal sedation - Target SBP 130-150.  -  Try to minimize Ativan   Acute hypoxic respiratory failure due to aspiration pneumonia.    Plan:   - Continue spontaneous breathing trials -Doubt he will be able to protect airway with extubation, discussed pros and cons of trial of extubation versus proceeding with tracheostomy.  Mom and fianc to discuss and let us know by  tomorrow    Hypertensive emergency -Due to rising creatinine DC'd irbesartan and chlorthalidone  -Continue metoprolol, hydralazine, amlodipine and clonidine  AKI-improving -iatrogenic hypernatremia.  -continue free water -Lasix 40 x 1    Summary -Showing signs of improvement despite evidence of extensive stroke on MRI.  Recovery is likely to be partial but given patient's young age, it may be worth giving him more time to see if a reasonable functional recovery is possible.  Would favor proceeding with tracheostomy  Best Practice (right click and "Reselect all SmartList Selections" daily)   Diet/type: tubefeeds DVT prophylaxis LMWH Pressure ulcer(s): N/A GI prophylaxis: H2B Lines: N/A Foley:  external catheter.  Code Status:  DNR with pre-arrest interventions   CRITICAL CARE Performed by: Comer Locket Halsey Hammen   Total critical care time: 32 minutes  Critical care time was exclusive of separately billable procedures and treating other patients.  Critical care was necessary to treat or prevent imminent or life-threatening deterioration.  Critical care was time spent personally by me on the following activities: development of treatment plan with patient and/or surrogate as well as nursing, discussions with consultants, evaluation of patient's response to treatment, examination of patient, obtaining history from patient or surrogate, ordering and performing treatments and interventions, ordering and review of laboratory studies, ordering and review of radiographic studies, pulse oximetry, re-evaluation of patient's condition and participation in multidisciplinary rounds.  Cyril Mourning MD. Tonny Bollman. [place] [facility] Pager : [phone]  If no response to pager , please call [phone] until 7 pm After 7:00 pm call Elink  [phone]   11/06/2023

## 2023-11-06 NOTE — Progress Notes (Signed)
 Daily Progress Note   Patient Name: Filemon Breton       Date: 11/06/2023 DOB: 09/29/1975  Age: 48 y.o. MRN#: [id number] Attending Physician: Stroke, Md, MD Primary Care Physician: Pcp, No Admit Date: 10/27/2023  Reason for Consultation/Follow-up: Establishing goals of care  Patient Profile/HPI:  48 y.o. male  with past medical history of hypertension who presented to the ED on 10/27/2023 after he was found down at home by his fiance. He was found to have an acute large hemorrhage centered in the pons with intraventricular extension in the fourth ventricle and also extension into the basal cisterns. Patient was intubated in the ED.    Palliative Medicine has been consulted for goals of care in the setting of "large ICH".    Subjective: Chart reviewed including labs, progress notes, imaging from this and previous encounters.  Per attending MD note- discussed tracheostomy with family.  Discussed with bedside RN- patient with no purposeful movements today.  [name] seemed receptive to tracheostomy, unsure about significant other.  I spoke with [name] at the bedside. She noted to patient's mom was going to discuss with patient's Dad before making a decision.  I offered followup family meeting but this was declined.   Review of Systems  Unable to perform ROS: Intubated  All other systems reviewed and are negative.    Physical Exam Vitals reviewed.  Cardiovascular:     Rate and Rhythm: Normal rate.  Pulmonary:     Comments: intubated Musculoskeletal:     Comments: L upper extremity with intermittent twitching  Neurological:     Comments: unresponsive             Vital Signs: BP 139/85   Pulse 69   Temp 99.9 F (37.7 C) (Axillary)   Resp (!) 22   Ht 5' 7.99" (1.727 m)    Wt 104.8 kg   SpO2 96%   BMI 35.14 kg/m  SpO2: SpO2: 96 % O2 Device: O2 Device: Ventilator O2 Flow Rate:    Intake/output summary:  Intake/Output Summary (Last 24 hours) at 11/06/2023 1109 Last data filed at 11/06/2023 0800 Gross per 24 hour  Intake 1170.58 ml  Output 1660 ml  Net -489.42 ml   LBM: Last BM Date : 11/05/23 Baseline Weight: Weight: 103 kg Most recent weight: Weight: 104.8 kg  Palliative Assessment/Data: PPS: 10%      Patient Active Problem List   Diagnosis Date Noted   ICH (intracerebral hemorrhage) (HCC) 10/27/2023   Intracranial hemorrhage (HCC) 10/27/2023    Palliative Care Assessment & Plan    Assessment/Recommendations/Plan  Patient with large brain stem bleed- poor recovery Continue current interventions- per previous PMT note family expressed that patient would not want trach/PEG   Code Status:   Code Status: Do not attempt resuscitation (DNR) PRE-ARREST INTERVENTIONS DESIRED   Prognosis:  Unable to determine  Discharge Planning: To Be Determined  Care plan was discussed with patient's family and bedside RN  Thank you for allowing the Palliative Medicine Team to assist in the care of this patient.  Total time:  Prolonged billing:  Time includes:   Preparing to see the patient (e.g., review of tests) Obtaining and/or reviewing separately obtained history Performing a medically necessary appropriate examination and/or evaluation Counseling and educating the patient/family/caregiver Ordering medications, tests, or procedures Referring and communicating with other health care professionals (when not reported separately) Documenting clinical information in the electronic or other health record Independently interpreting results (not reported separately) and communicating results to the patient/family/caregiver Care coordination (not reported separately) Clinical documentation  Ocie Bob, AGNP-C Palliative  Medicine   Please contact Palliative Medicine Team phone at [phone] for questions and concerns.

## 2023-11-06 NOTE — Progress Notes (Addendum)
 STROKE TEAM PROGRESS NOTE   INTERIM HISTORY/SUBJECTIVE Family at the bedside. Mom on the phone.  Patient remains intubated on no sedation on CPAP.  WBC 15.5, T max 100.9, WBC 15.5, Na 149, BUN 53, Cr 1.56 Repeat Ct head Interval decrease in size of subacute pontine hemorrhage, Decreased regional mass effect, Interval decrease in scattered small volume subarachnoid and intraventricular hemorrhage.  Neurological exam today he is able to move his eyes downward, moves left arm, attempting to such [place] 2 fingers on left hand, withdraws on right arm, no response to pain on bilateral lowers   OBJECTIVE CBC    Component Value Date/Time   WBC 15.5 (H) 11/06/2023 0631   RBC 3.24 (L) 11/06/2023 0631   HGB 10.1 (L) 11/06/2023 0631   HCT 33.5 (L) 11/06/2023 0631   PLT 385 11/06/2023 0631   MCV 103.4 (H) 11/06/2023 0631   MCH 31.2 11/06/2023 0631   MCHC 30.1 11/06/2023 0631   RDW 14.4 11/06/2023 0631   LYMPHSABS 1.3 10/27/2023 1826   MONOABS 1.4 (H) 10/27/2023 1826   EOSABS 0.1 10/27/2023 1826   BASOSABS 0.1 10/27/2023 1826   BMET    Component Value Date/Time   NA 149 (H) 11/06/2023 0631   K 3.7 11/06/2023 0631   CL 117 (H) 11/06/2023 0631   CO2 23 11/06/2023 0631   GLUCOSE 140 (H) 11/06/2023 0631   BUN 53 (H) 11/06/2023 0631   CREATININE 1.56 (H) 11/06/2023 0631   CALCIUM 8.3 (L) 11/06/2023 0631   GFRNONAA 55 (L) 11/06/2023 0631   IMAGING past 24 hours CT HEAD WO CONTRAST ( ) Result Date: 11/06/2023 CLINICAL DATA:  Follow-up examination for stroke. EXAM: CT HEAD WITHOUT CONTRAST TECHNIQUE: Contiguous axial images were obtained from the base of the skull through the vertex without intravenous contrast. RADIATION DOSE REDUCTION: This exam was performed according to the departmental dose-optimization program which includes automated exposure control, adjustment of the mA and/or kV according to patient size and/or use of iterative reconstruction technique. COMPARISON:  CT from 10/28/2023.  FINDINGS: Brain: Previously identified hemorrhage centered at the pons again seen, decreased in size now measuring approximately 3.5 x 3.4 x 3.3 cm, previously 4.7 x 3.7 x 3.4 cm on prior exam. Decreased attenuation about the bleed, consistent with subacute hemorrhage. Scattered small volume subarachnoid hemorrhage within both cerebral hemispheres is also decreased, and nearly resolved. Intraventricular extension with small volume intraventricular blood, also decreased from prior. No hydrocephalus or trapping. Basilar cisterns remain patent. No midline shift. No other new acute intracranial hemorrhage. No other acute large vessel territory infarct. No mass lesion or extra-axial fluid collection. Vascular: No abnormal hyperdense vessel. Skull: Scalp soft tissues and calvarium demonstrate no new finding. Sinuses/Orbits: Globes and orbital soft tissues within normal limits. Moderate mucosal thickening present about the sphenoid and maxillary sinuses. Trace bilateral mastoid effusions. Nasogastric tube in place. Other: None. IMPRESSION: 1. Interval decrease in size of subacute pontine hemorrhage, now measuring approximately 3.5 x 3.4 x 3.3 cm, previously 4.7 x 3.7 x 3.4 cm. Decreased regional mass effect with improved fourth ventricular patency from prior. 2. Interval decrease in scattered small volume subarachnoid and intraventricular hemorrhage. No hydrocephalus or trapping. 3. No other new acute intracranial abnormality. Electronically Signed   By: Rise Mu M.D.   On: 11/06/2023 00:35     Vitals:   11/06/23 0600 11/06/23 0700 11/06/23 0800 11/06/23 1200  BP: 123/75 119/72 139/85   Pulse: 74 74 69   Resp: 20 (!) 23 (!) 22   Temp:  99.9 F (37.7 C) 99.4 F (37.4 C)  TempSrc:   Axillary Axillary  SpO2: 99% 99% 96%   Weight:      Height:       PHYSICAL EXAM General: Intubated patient in no acute distress CV: Regular rate and rhythm on monitor Respiratory: Mechanically ventilated,  NEURO  (off of sedation since 3/14):  Mental Status: Lethargic, not following commands.  Cranial Nerves:  II: Pupils pinpoint, nonreactive,  III, IV, VI: Eyes midline, no blink to threat bilaterally,not moving eyes today V: Corneal intact on the right, absent on the left VII: Face appears symmetrical resting with ET tube present IX, X: Cough intact, gag absent XI: Head is midline XII: ETT in place Motor/Sensory:no response to painful stimuli  Coordination: Deferred Gait: Deferred  ASSESSMENT/PLAN  [name] is a 48 y.o. male with history of HTN admitted for ICH.    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 Basal cisterns and fourth ventricle are effaced without hydrocephalus at this time.  CTA head & neck no LVO CT repeat 3/9 no substantial change  Repeat Ct head 3/18 Interval decrease in size of subacute pontine hemorrhage, Decreased regional mass effect, Interval decrease in scattered small volume subarachnoid and intraventricular hemorrhage. 2D Echo EF 60 to 65%, moderate concentric LVH, grade 1 diastolic dysfunction, normal left atrial size, no atrial level shunt LDL 96 HgbA1c 4.7 UDS positive for opiates VTE prophylaxis - SQ lovenox No antithrombotic prior to admission, now on No antithrombotic secondary to ICH Therapy recommendations:  Pending Disposition:  pending, palliative care on board  Brainstem compression Neuro exam remains poor, given concern for edema in the brainstem and surrounding structures 3% hypertonic off On FW 200 Q2->Q4 Na 154> 159 >157 >158->151->149  Hypertension Home meds:  None Stable off Cleviprex On Norvasc, clonidine, hydralazine, losartan, Lopressor PRN labetalol  BP goal less than 160 Long-term BP goal  normotensive  Hypoxic respiratory failure Aspiration PNA Intubated in the ED PCCM following SVT/VAP per CCM CXR Streaky left basilar opacity  Tmax 101.5->100.4->102.1->100.9 WBC 20.4-> 9.8 -> 13.2-> 18.1-14.1--15.5 Unasyn completed Family adamant about no trach before Saturday to see if can be extubated by then.    Dysphagia Coretrak in place right nare On tube feeding and free water 200 cc Q4h  Substance use UDS positive for opioids Narcan given in ED  Other Active Problems Hypokalemia, resolved, K2.6 -> 4.2-> 3.3-> 3.4->4.1-> 3.5-> 3.8 AKI, Cr 2.2-> 1.54 > 1.38> 1.37-1.71-1.56  Hospital day # 10  Patient seen and examined by NP/APP with MD. MD to update note as needed.   Gevena Mart DNP, ACNPC-AG  [facility]  ATTENDING NOTE: I reviewed above note and agree with the assessment and plan. Pt was seen and examined.   Fiance and aunt at the bedside. Pt still intubated not on sedation, eyes half way open, but not following commands. But seems moving left hand spontaneously. No movement of both lower extremities even with pain stimulation. Withdraw slightly on the RUE. Still able to have downward gaze spontaneously but not quite following commands with eye movement. Still has low grade fever, leukocytosis, Na trending down slowly, AKI improving. Family adamant about no trach until 14 days out, discussed with CCM Dr. Vassie Loll.   For detailed assessment and plan, please refer to above/below as I  have made changes wherever appropriate.   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 This patient's care requires constant monitoring of vital signs, hemodynamics, respiratory and cardiac monitoring, review of multiple databases, neurological assessment, discussion with family,  other specialists and medical decision making of high complexity. I spent 40 minutes of neurocritical care time in the care of this patient. I had long discussion with fiance at bedside and mom over the phone, updated pt current condition, treatment plan and potential prognosis, and answered all the questions.  She expressed understanding and appreciation.    To contact Stroke Continuity provider, please refer to [email or web address]. After hours, contact General Neurology

## 2023-11-07 ENCOUNTER — Inpatient Hospital Stay (HOSPITAL_COMMUNITY): Payer: MEDICAID

## 2023-11-07 DIAGNOSIS — J9601 Acute respiratory failure with hypoxia: Secondary | ICD-10-CM | POA: Diagnosis not present

## 2023-11-07 DIAGNOSIS — I161 Hypertensive emergency: Secondary | ICD-10-CM | POA: Diagnosis not present

## 2023-11-07 DIAGNOSIS — I613 Nontraumatic intracerebral hemorrhage in brain stem: Secondary | ICD-10-CM | POA: Diagnosis not present

## 2023-11-07 DIAGNOSIS — J690 Pneumonitis due to inhalation of food and vomit: Secondary | ICD-10-CM | POA: Diagnosis not present

## 2023-11-07 DIAGNOSIS — G935 Compression of brain: Secondary | ICD-10-CM | POA: Diagnosis not present

## 2023-11-07 DIAGNOSIS — I10 Essential (primary) hypertension: Secondary | ICD-10-CM | POA: Diagnosis not present

## 2023-11-07 DIAGNOSIS — R509 Fever, unspecified: Secondary | ICD-10-CM | POA: Diagnosis not present

## 2023-11-07 LAB — CBC
HCT: 34.1 % — ABNORMAL LOW (ref 39.0–52.0)
Hemoglobin: 10.3 g/dL — ABNORMAL LOW (ref 13.0–17.0)
MCH: 31.3 pg (ref 26.0–34.0)
MCHC: 30.2 g/dL (ref 30.0–36.0)
MCV: 103.6 fL — ABNORMAL HIGH (ref 80.0–100.0)
Platelets: 402 10*3/uL — ABNORMAL HIGH (ref 150–400)
RBC: 3.29 MIL/uL — ABNORMAL LOW (ref 4.22–5.81)
RDW: 14.1 % (ref 11.5–15.5)
WBC: 18.3 10*3/uL — ABNORMAL HIGH (ref 4.0–10.5)
nRBC: 0 % (ref 0.0–0.2)

## 2023-11-07 LAB — URINALYSIS, ROUTINE W REFLEX MICROSCOPIC
Bilirubin Urine: NEGATIVE
Glucose, UA: NEGATIVE mg/dL
Hgb urine dipstick: NEGATIVE
Ketones, ur: NEGATIVE mg/dL
Leukocytes,Ua: NEGATIVE
Nitrite: NEGATIVE
Protein, ur: NEGATIVE mg/dL
Specific Gravity, Urine: 1.017 (ref 1.005–1.030)
pH: 5 (ref 5.0–8.0)

## 2023-11-07 LAB — GLUCOSE, CAPILLARY
Glucose-Capillary: 105 mg/dL — ABNORMAL HIGH (ref 70–99)
Glucose-Capillary: 122 mg/dL — ABNORMAL HIGH (ref 70–99)
Glucose-Capillary: 125 mg/dL — ABNORMAL HIGH (ref 70–99)
Glucose-Capillary: 136 mg/dL — ABNORMAL HIGH (ref 70–99)
Glucose-Capillary: 151 mg/dL — ABNORMAL HIGH (ref 70–99)
Glucose-Capillary: 164 mg/dL — ABNORMAL HIGH (ref 70–99)

## 2023-11-07 LAB — MAGNESIUM: Magnesium: 2.9 mg/dL — ABNORMAL HIGH (ref 1.7–2.4)

## 2023-11-07 LAB — BASIC METABOLIC PANEL
Anion gap: 11 (ref 5–15)
BUN: 54 mg/dL — ABNORMAL HIGH (ref 6–20)
CO2: 22 mmol/L (ref 22–32)
Calcium: 8.4 mg/dL — ABNORMAL LOW (ref 8.9–10.3)
Chloride: 114 mmol/L — ABNORMAL HIGH (ref 98–111)
Creatinine, Ser: 1.16 mg/dL (ref 0.61–1.24)
GFR, Estimated: >60 mL/min (ref >60)
Glucose, Bld: 150 mg/dL — ABNORMAL HIGH (ref 70–99)
Potassium: 3.8 mmol/L (ref 3.5–5.1)
Sodium: 147 mmol/L — ABNORMAL HIGH (ref 135–145)

## 2023-11-07 LAB — PHOSPHORUS: Phosphorus: 4.9 mg/dL — ABNORMAL HIGH (ref 2.5–4.6)

## 2023-11-07 MED ORDER — POTASSIUM CHLORIDE 20 MEQ PO PACK
40.0000 meq | PACK | Freq: Once | ORAL | Status: AC
  Administered 2023-11-07: 40 meq
  Filled 2023-11-07: qty 2

## 2023-11-07 MED ORDER — FUROSEMIDE 10 MG/ML IJ SOLN
40.0000 mg | Freq: Once | INTRAMUSCULAR | Status: AC
  Administered 2023-11-07: 40 mg via INTRAVENOUS
  Filled 2023-11-07: qty 4

## 2023-11-07 NOTE — Progress Notes (Addendum)
 NAME:  Garrett [name], MRN:  [id number], DOB:  Oct 02, 1975, LOS: 11 ADMISSION DATE:  10/27/2023, CONSULTATION DATE:  10/27/2023 REFERRING MD:  Coralee Pesa, DO CHIEF COMPLAINT:  Found Down  History of Present Illness:   48 y/o male with h/o HTN who was found down for unknown downtime by his [place] when she came home from work, he was having agonal breathing.  He apparently had driven her to work this morning.  He has HTN but never checks his BP and does not follow with MD.  She says you can tell when his BP is really high; his eyes get blood shot and he is sweating.  No h/o seizures.  Besides almost daily [name] and cigarette smoking, she is not aware of any other drugs. Drug screen positive for opioids and he was given several doses of Narcan. Patient found to have:  1. 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 Basal cisterns and fourth ventricle are effaced without hydrocephalus at this time. 2. Trace additional subarachnoid hemorrhage along the left parietal convexity. BP in ED 262/156. His cxr revealed a RUL and perihilar infiltrates suggesting aspiration pneumonia. ED labs also revealed PH 7.169, LA 4.4, CPK 985. Patient was intubated in ED.  Pertinent  Medical History  HTN  Significant Hospital Events: Including procedures, antibiotic start and stop dates in addition to other pertinent events   3/8: Intubated in ED, pontine bleed/SAH. CT head 17:09 Acute large hemorrhage 4.1 cm in pons with intraventricular extension without hydrocephalus 3/9: CTA 03:14 AM No change in IPH in pons, similar biparietal [facility] 3/14: Now awake, appears locked in can follow commands with eyes. 3/16 Purposeful with left upper extremity   Interim History / Subjective:  Remains critically ill, intubated Febrile 101.3 yesterday, defervesced this morning Remains off sedation Good urine  output    Objective   Blood pressure 134/77, pulse 74, temperature 99.8 F (37.7 C), temperature source Axillary, resp. rate (!) 25, height 5' 7.99" (1.727 m), weight 105.8 kg, SpO2 97%.    Vent Mode: PSV;CPAP FiO2 (%):  [40 %] 40 % Set Rate:  [18 bmp] 18 bmp Vt Set:  [550 mL] 550 mL PEEP:  [5 cmH20] 5 cmH20 Pressure Support:  [8 cmH20-10 cmH20] 10 cmH20 Plateau Pressure:  [19 cmH20-21 cmH20] 21 cmH20   Intake/Output Summary (Last 24 hours) at 11/07/2023 0950 Last data filed at 11/07/2023 0800 Gross per 24 hour  Intake 1520 ml  Output 975 ml  Net 545 ml   Filed Weights   11/03/23 0500 11/04/23 0500 11/07/23 0200  Weight: 107.3 kg 104.8 kg 105.8 kg   Physical Exam: Gen:      No acute distress, obese. HEENT:  EOMI, sclera anicteric Neck:     ET tube, small bore feeding tube in place Lungs:    Bilateral ventilated breath sounds, no accessory muscle use, on pressure support weaning, hiccups CV:        S1-S2 regular Abd:      Abdomen soft.,  Bowel sounds present.  FMS in place, stool consistency improving Ext:    Mild generalized edema. Skin:      Warm and dry; no rash Neuro: Does not follow commands this morning, was moving  left hand  and flicker movement of left leg, right hemiplegia  Labs show decreasing hypernatremia 147, increasing leukocytosis, decreased creatinine to 1.1  Ancillary Tests Personally Reviewed:   CT head on  3/9 shows extensive hemorrhage involving better part of the brainstem.  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   -Maintain minimal sedation - Target SBP 130-150.  -  dc  Ativan   Acute hypoxic respiratory failure due to aspiration pneumonia.    Plan:   - Continue spontaneous breathing trials -Doubt he will be able to protect airway with extubation, discussed pros and cons of trial of extubation versus proceeding with tracheostomy.  Mom and fianc are fixated on 14 days  before tracheostomy, needs more discussion    Hypertensive emergency -Due to rising creatinine DC'd irbesartan and chlorthalidone  -Continue metoprolol, hydralazine, amlodipine and minimize clonidine  AKI-improving -iatrogenic hypernatremia-resolving -continue free water -Lasix 40 repeat  Fever -could possibly be central, but with leukocytosis, have to remain vigilant for infection. Check chest x-ray and urinalysis and consider DC Foley  Summary -His neurological improvement has plateaued. recovery is likely to be partial but given patient's young age, it may be worth giving him more time to see if a reasonable functional recovery is possible.  Would favor proceeding with tracheostomy Fianc updated at bedside today  Best Practice (right click and "Reselect all SmartList Selections" daily)   Diet/type: tubefeeds DVT prophylaxis LMWH Pressure ulcer(s): N/A GI prophylaxis: H2B Lines: N/A Foley:  external catheter.  Code Status:  DNR with pre-arrest interventions    CRITICAL CARE Performed by: Comer Locket Jiya Kissinger   Total critical care time: 33 minutes  Critical care time was exclusive of separately billable procedures and treating other patients.  Critical care was necessary to treat or prevent imminent or life-threatening deterioration.  Critical care was time spent personally by me on the following activities: development of treatment plan with patient and/or surrogate as well as nursing, discussions with consultants, evaluation of patient's response to treatment, examination of patient, obtaining history from patient or surrogate, ordering and performing treatments and interventions, ordering and review of laboratory studies, ordering and review of radiographic studies, pulse oximetry, re-evaluation of patient's condition and participation in multidisciplinary rounds.  Cyril Mourning MD. Tonny Bollman. [place] [facility] Pager : [phone]  If no response to pager , please  call [phone] until 7 pm After 7:00 pm call Elink  [phone]   11/07/2023

## 2023-11-07 NOTE — Progress Notes (Addendum)
 STROKE TEAM PROGRESS NOTE   INTERIM HISTORY/SUBJECTIVE  Fiance at bedside. RN at bedside.  No acute events overnight.  Neurological exam stable.  OBJECTIVE CBC    Component Value Date/Time   WBC 18.3 (H) 11/07/2023 0509   RBC 3.29 (L) 11/07/2023 0509   HGB 10.3 (L) 11/07/2023 0509   HCT 34.1 (L) 11/07/2023 0509   PLT 402 (H) 11/07/2023 0509   MCV 103.6 (H) 11/07/2023 0509   MCH 31.3 11/07/2023 0509   MCHC 30.2 11/07/2023 0509   RDW 14.1 11/07/2023 0509   LYMPHSABS 1.3 10/27/2023 1826   MONOABS 1.4 (H) 10/27/2023 1826   EOSABS 0.1 10/27/2023 1826   BASOSABS 0.1 10/27/2023 1826   BMET    Component Value Date/Time   NA 147 (H) 11/07/2023 0509   K 3.8 11/07/2023 0509   CL 114 (H) 11/07/2023 0509   CO2 22 11/07/2023 0509   GLUCOSE 150 (H) 11/07/2023 0509   BUN 54 (H) 11/07/2023 0509   CREATININE 1.16 11/07/2023 0509   CALCIUM 8.4 (L) 11/07/2023 0509   GFRNONAA >60 11/07/2023 0509   IMAGING past 24 hours No results found.    Vitals:   11/07/23 0500 11/07/23 0600 11/07/23 0800 11/07/23 0811  BP: 126/76 129/79 130/75   Pulse: 71 74 73   Resp: 19 18 20    Temp:   99.8 F (37.7 C)   TempSrc:   Axillary   SpO2: 98% 99% 98% 97%  Weight:      Height:       PHYSICAL EXAM General: Intubated patient in no acute distress CV: Regular rate and rhythm on monitor Respiratory: Mechanically ventilated  NEURO (off of sedation since 3/14):  Mental Status: Lethargic, not following commands.  Cranial Nerves:  II: Pupils pinpoint, nonreactive III, IV, VI: E eyes are midline, he does not blink to threat bilaterally. V: Weak corneal on right, absent on left VII: Face appears symmetrical resting with ET tube present IX, X: Cough and gag intact XI: Head is midline XII: ETT in place  Motor/Sensory: XBM:WUXL slight withdrawal to noxious stimuli LUE: Withdrawal and grimace to noxious stimuli BLE triple flexion  Coordination: Deferred Gait: Deferred  ASSESSMENT/PLAN  Mr.  Garrett [name] is a 48 y.o. male with history of HTN admitted for ICH.    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 Basal cisterns and fourth ventricle are effaced without hydrocephalus at this time.  CTA head & neck no LVO CT repeat 3/9 no substantial change  Repeat Ct head 3/18 Interval decrease in size of subacute pontine hemorrhage, Decreased regional mass effect, Interval decrease in scattered small volume subarachnoid and intraventricular hemorrhage. 2D Echo EF 60 to 65%, moderate concentric LVH, grade 1 diastolic dysfunction, normal left atrial size, no atrial level shunt LDL 96 HgbA1c 4.7 UDS positive for opiates VTE prophylaxis - continue SQ lovenox No antithrombotic prior to admission, continue No antithrombotic secondary to ICH Therapy recommendations:  Pending Disposition:  pending, palliative care on board.   Brainstem compression Continued poor neurological exam 3% hypertonic off Continue FW 200 Q4 Na 154> 159 >157 >158->151->149->147  Hypertension Home meds:  None Stable Continue Norvasc, clonidine, hydralazine, losartan, Lopressor PRN labetalol  BP goal less than 160 Long-term BP goal normotensive  Hypoxic respiratory failure Aspiration PNA Intubated in the ED PCCM following, appreciate  assitance SVT/VAP per CCM CXR Streaky left basilar opacity  Tmax 101.5->100.4->102.1->100.9->99.8 WBC 20.4-> 9.8 -> 13.2-> 18.1-14.1--15.5->18.3 Unasyn completed Family would like to wait full 14 days before placing trach   Dysphagia Coretrak in place right nare Continue tube feeding and free water 200 cc Q4h  Substance use UDS positive for opioids Narcan given in ED  Other Active Problems Hypokalemia: K 2.6 -> 4.2-> 3.3-> 3.4->4.1->  3.5-> 3.8--supplement and monitor AKI, Cr 2.2-> 1.54 > 1.38> 1.37-1.71-1.56->1.16  Hospital day # 11   Pt seen by Neuro NP/APP and later by MD. Note/plan to be edited by MD as needed.    [name], DNP, AGACNP-BC [facility] Please use AMION for contact information & EPIC for messaging.  ATTENDING NOTE: I reviewed above note and agree with the assessment and plan. Pt was seen and examined.   Neysa Bonito and other family members are at bedside.  Patient still intubated, lying in bed, eyes halfway open, however still not follow commands except with multiple verbal commands, left hand briefly showing thumb up vs. Random finger movement.  Vital signs stable, creatinine improved, no fever this morning.  Family would like to wait for 14 days before tracheostomy.  Discussed with CCM.  Continue current management.  For detailed assessment and plan, please refer to above/below as I have made changes wherever appropriate.   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 This patient's care requires constant monitoring of vital signs, hemodynamics, respiratory and cardiac monitoring, review of multiple databases, neurological assessment, discussion with family, other specialists and medical decision making of high complexity. I spent 35 minutes of neurocritical care time in the care of this patient. I had long discussion with fiance at bedside, updated pt current condition, treatment plan and potential prognosis, and answered all the questions.  She expressed understanding and appreciation.

## 2023-11-08 DIAGNOSIS — J9601 Acute respiratory failure with hypoxia: Secondary | ICD-10-CM | POA: Diagnosis not present

## 2023-11-08 DIAGNOSIS — J690 Pneumonitis due to inhalation of food and vomit: Secondary | ICD-10-CM | POA: Diagnosis not present

## 2023-11-08 DIAGNOSIS — I161 Hypertensive emergency: Secondary | ICD-10-CM | POA: Diagnosis not present

## 2023-11-08 DIAGNOSIS — I10 Essential (primary) hypertension: Secondary | ICD-10-CM | POA: Diagnosis not present

## 2023-11-08 DIAGNOSIS — I613 Nontraumatic intracerebral hemorrhage in brain stem: Secondary | ICD-10-CM | POA: Diagnosis not present

## 2023-11-08 DIAGNOSIS — G935 Compression of brain: Secondary | ICD-10-CM | POA: Diagnosis not present

## 2023-11-08 LAB — BASIC METABOLIC PANEL
Anion gap: 7 (ref 5–15)
BUN: 67 mg/dL — ABNORMAL HIGH (ref 6–20)
CO2: 23 mmol/L (ref 22–32)
Calcium: 8 mg/dL — ABNORMAL LOW (ref 8.9–10.3)
Chloride: 114 mmol/L — ABNORMAL HIGH (ref 98–111)
Creatinine, Ser: 1.75 mg/dL — ABNORMAL HIGH (ref 0.61–1.24)
GFR, Estimated: 48 mL/min — ABNORMAL LOW (ref >60)
Glucose, Bld: 138 mg/dL — ABNORMAL HIGH (ref 70–99)
Potassium: 4.1 mmol/L (ref 3.5–5.1)
Sodium: 144 mmol/L (ref 135–145)

## 2023-11-08 LAB — PHOSPHORUS: Phosphorus: 4.8 mg/dL — ABNORMAL HIGH (ref 2.5–4.6)

## 2023-11-08 LAB — CBC
HCT: 30.4 % — ABNORMAL LOW (ref 39.0–52.0)
Hemoglobin: 9.2 g/dL — ABNORMAL LOW (ref 13.0–17.0)
MCH: 31.1 pg (ref 26.0–34.0)
MCHC: 30.3 g/dL (ref 30.0–36.0)
MCV: 102.7 fL — ABNORMAL HIGH (ref 80.0–100.0)
Platelets: 381 10*3/uL (ref 150–400)
RBC: 2.96 MIL/uL — ABNORMAL LOW (ref 4.22–5.81)
RDW: 14.1 % (ref 11.5–15.5)
WBC: 13.2 10*3/uL — ABNORMAL HIGH (ref 4.0–10.5)
nRBC: 0 % (ref 0.0–0.2)

## 2023-11-08 LAB — GLUCOSE, CAPILLARY
Glucose-Capillary: 130 mg/dL — ABNORMAL HIGH (ref 70–99)
Glucose-Capillary: 115 mg/dL — ABNORMAL HIGH (ref 70–99)
Glucose-Capillary: 127 mg/dL — ABNORMAL HIGH (ref 70–99)
Glucose-Capillary: 99 mg/dL (ref 70–99)
Glucose-Capillary: 136 mg/dL — ABNORMAL HIGH (ref 70–99)
Glucose-Capillary: 118 mg/dL — ABNORMAL HIGH (ref 70–99)

## 2023-11-08 LAB — MAGNESIUM: Magnesium: 3 mg/dL — ABNORMAL HIGH (ref 1.7–2.4)

## 2023-11-08 MED ORDER — POLYETHYLENE GLYCOL 3350 17 G PO PACK
17.0000 g | PACK | Freq: Every day | ORAL | Status: DC
  Administered 2023-11-08: 17 g
  Filled 2023-11-08: qty 1

## 2023-11-08 MED ORDER — PROSOURCE TF20 ENFIT COMPATIBL EN LIQD
60.0000 mL | Freq: Three times a day (TID) | ENTERAL | Status: DC
  Administered 2023-11-08 – 2024-02-25 (×310): 60 mL
  Filled 2023-11-08 (×313): qty 60

## 2023-11-08 MED ORDER — LACTULOSE 10 GM/15ML PO SOLN
20.0000 g | Freq: Once | ORAL | Status: AC
  Administered 2023-11-08: 20 g
  Filled 2023-11-08: qty 30

## 2023-11-08 MED ORDER — OSMOLITE 1.5 CAL PO LIQD
1000.0000 mL | ORAL | Status: DC
  Administered 2023-11-08 – 2024-01-15 (×47): 1000 mL
  Filled 2023-11-08 (×8): qty 1000

## 2023-11-08 NOTE — Progress Notes (Signed)
 NAME:  Garrett [name], MRN:  [id number], DOB:  [birth year], LOS: 12 ADMISSION DATE:  10/27/2023, CONSULTATION DATE:  10/27/2023 REFERRING MD:  Coralee Pesa, DO CHIEF COMPLAINT:  Found Down  History of Present Illness:   48 y/o male with h/o HTN who was found down for unknown downtime by his [place] when she came home from work, he was having agonal breathing.  He apparently had driven her to work this morning.  He has HTN but never checks his BP and does not follow with MD.  She says you can tell when his BP is really high; his eyes get blood shot and he is sweating.  No h/o seizures.  Besides almost daily [name] and cigarette smoking, she is not aware of any other drugs. Drug screen positive for opioids and he was given several doses of Narcan. Patient found to have:  1. 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 Basal cisterns and fourth ventricle are effaced without hydrocephalus at this time. 2. Trace additional subarachnoid hemorrhage along the left parietal convexity. BP in ED 262/156. His cxr revealed a RUL and perihilar infiltrates suggesting aspiration pneumonia. ED labs also revealed PH 7.169, LA 4.4, CPK 985. Patient was intubated in ED.  Pertinent  Medical History  HTN  Significant Hospital Events: Including procedures, antibiotic start and stop dates in addition to other pertinent events   3/8: Intubated in ED, pontine bleed/SAH. CT head 17:09 Acute large hemorrhage 4.1 cm in pons with intraventricular extension without hydrocephalus 3/9: CTA 03:14 AM No change in IPH in pons, similar biparietal [facility] 3/14: Now awake, appears locked in can follow commands with eyes. 3/16 Purposeful with left upper extremity   Interim History / Subjective:  Remains critically ill, intubated Low-grade febrile Not awake for me or RN yesterday and today but fianc states he  was more awake yesterday afternoon    Objective   Blood pressure 117/65, pulse 77, temperature 99.7 F (37.6 C), temperature source Axillary, resp. rate (!) 23, height 5' 7.99" (1.727 m), weight 106.9 kg, SpO2 100%.    Vent Mode: PSV;CPAP FiO2 (%):  [30 %-40 %] 30 % Set Rate:  [18 bmp] 18 bmp Vt Set:  [550 mL] 550 mL PEEP:  [5 cmH20] 5 cmH20 Pressure Support:  [5 cmH20-10 cmH20] 5 cmH20 Plateau Pressure:  [18 cmH20-22 cmH20] 19 cmH20   Intake/Output Summary (Last 24 hours) at 11/08/2023 0959 Last data filed at 11/08/2023 0900 Gross per 24 hour  Intake 1775 ml  Output 1895 ml  Net -120 ml   Filed Weights   11/04/23 0500 11/07/23 0200 11/08/23 0500  Weight: 104.8 kg 105.8 kg 106.9 kg   Physical Exam: Gen:      No acute distress, obese. HEENT:  EOMI, sclera anicteric Neck:     ET tube, small bore feeding tube in place Lungs:    Bilateral ventilated breath sounds, no accessory muscle use, on pressure support weaning, hiccups CV:        S1-S2 regular Abd:      Abdomen soft.,  Bowel sounds present.  FMS in place, stool consistency improving Ext:    Mild generalized edema. Skin:      Warm and dry; no rash Neuro: Does not follow commands again this morning, was moving  left hand  and flicker movement of left leg, right hemiplegia on 3/18  Labs show normal sodium, decreased leukocytosis, stable anemia  Ancillary Tests Personally Reviewed:   CT head on 3/9 shows extensive hemorrhage involving better part of the brainstem.  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   -Maintain minimal sedation - Target SBP 130-150.  -  dc  Ativan   Acute hypoxic respiratory failure due to aspiration pneumonia.    Plan:   - Continue spontaneous breathing trials -Doubt he will be able to protect airway with extubation, discussed pros and cons of trial of extubation versus proceeding with tracheostomy.  Mom and fianc now  agreeable to proceed with tracheostomy, consent obtained    Hypertensive emergency -Due to rising creatinine DC'd irbesartan and chlorthalidone  -Continue metoprolol, hydralazine, amlodipine  -BP is trending lower and concern for sedation, will DC clonidine  AKI-improved to 1.1 now back up to 1.7 post diuresis -iatrogenic hypernatremia-resolving -continue free water -Hold further Lasix   Fever -could possibly be central, but with leukocytosis, have to remain vigilant for infection. Clear chest x-ray and urinalysis >>reassuring  Summary -His neurological improvement has plateaued. recovery is likely to be partial but given patient's young age, it may be worth giving him more time to see if a reasonable functional recovery is possible.  Would favor proceeding with tracheostomy Mom & Fianc updated at bedside today  Best Practice (right click and "Reselect all SmartList Selections" daily)   Diet/type: tubefeeds DVT prophylaxis LMWH Pressure ulcer(s): N/A GI prophylaxis: H2B Lines: N/A Foley:  external catheter.  Code Status:  DNR with pre-arrest interventions    CRITICAL CARE Performed by: Comer Locket Keely Drennan   Total critical care time: 32 minutes  Critical care time was exclusive of separately billable procedures and treating other patients.  Critical care was necessary to treat or prevent imminent or life-threatening deterioration.  Critical care was time spent personally by me on the following activities: development of treatment plan with patient and/or surrogate as well as nursing, discussions with consultants, evaluation of patient's response to treatment, examination of patient, obtaining history from patient or surrogate, ordering and performing treatments and interventions, ordering and review of laboratory studies, ordering and review of radiographic studies, pulse oximetry, re-evaluation of patient's condition and participation in multidisciplinary rounds.  Cyril Mourning  MD. Tonny Bollman. [facility] Pager : [phone]  If no response to pager , please call [phone] until 7 pm After 7:00 pm call Elink  [phone]   11/08/2023

## 2023-11-08 NOTE — Plan of Care (Signed)
  Problem: Elimination: Goal: Will not experience complications related to bowel motility Outcome: Progressing     Patient with small amount of emesis. Per fiance at bedside, patient had just been coughing/gagging on ETT. Patient given miralax this AM for no noted BM since 3/17. +bowel sounds +flatus. Tube feeds paused. CCM aware, orders obtained.

## 2023-11-08 NOTE — Progress Notes (Signed)
 STROKE TEAM PROGRESS NOTE   INTERIM HISTORY/SUBJECTIVE  Mother at bedside,updated on assessment, all questions answered.  RN at bedside. Neuroexam shows abnormal extensor posturing bilateral upper extremities with triple flexion continuing in bilateral lower extremities.  Patient does not open his eyes to voice or noxious stimuli, or follow commands.   OBJECTIVE CBC    Component Value Date/Time   WBC 13.2 (H) 11/08/2023 0430   RBC 2.96 (L) 11/08/2023 0430   HGB 9.2 (L) 11/08/2023 0430   HCT 30.4 (L) 11/08/2023 0430   PLT 381 11/08/2023 0430   MCV 102.7 (H) 11/08/2023 0430   MCH 31.1 11/08/2023 0430   MCHC 30.3 11/08/2023 0430   RDW 14.1 11/08/2023 0430   LYMPHSABS 1.3 10/27/2023 1826   MONOABS 1.4 (H) 10/27/2023 1826   EOSABS 0.1 10/27/2023 1826   BASOSABS 0.1 10/27/2023 1826   BMET    Component Value Date/Time   NA 144 11/08/2023 0430   K 4.1 11/08/2023 0430   CL 114 (H) 11/08/2023 0430   CO2 23 11/08/2023 0430   GLUCOSE 138 (H) 11/08/2023 0430   BUN 67 (H) 11/08/2023 0430   CREATININE 1.75 (H) 11/08/2023 0430   CALCIUM 8.0 (L) 11/08/2023 0430   GFRNONAA 48 (L) 11/08/2023 0430   IMAGING past 24 hours DG CHEST PORT 1 VIEW Result Date: 11/07/2023 CLINICAL DATA:  Fever. EXAM: PORTABLE CHEST 1 VIEW COMPARISON:  Chest radiographs 11/05/2023 and 11/03/2023 FINDINGS: Endotracheal tube terminates approximately 3.3 cm above the carina, at the inferior aspect of the clavicular heads. Enteric tube descends below the diaphragm and off the inferior plantar view but the distal aspect is seen again overlying the superolateral aspect of the stomach and left upper abdominal quadrant. Cardiac silhouette and mediastinal contours are within normal limits. No focal airspace opacity to indicate pneumonia. No pleural effusion or pneumothorax. No acute skeletal abnormality. IMPRESSION: 1. Endotracheal tube terminates approximately 3.3 cm above the carina. 2. No acute cardiopulmonary process.  Electronically Signed   By: Neita Garnet M.D.   On: 11/07/2023 11:45      Vitals:   11/08/23 0600 11/08/23 0700 11/08/23 0800 11/08/23 0807  BP: 109/65 109/66    Pulse: 75 71    Resp: 18 18    Temp:   99.7 F (37.6 C)   TempSrc:   Axillary   SpO2: 99% 99%  100%  Weight:      Height:       PHYSICAL EXAM General: Intubated patient in no acute distress CV: Regular rate and rhythm on monitor Respiratory: Mechanically ventilated  NEURO (off of sedation since 3/14):  Mental Status:Obtunded, does not open eyes to voice or noxious stimuli, does not follow commands..  Cranial Nerves:  II: Pupils pinpoint, nonreactive III, IV, VI: Eyes a with slightly upward gaze, he does not blink to threat bilaterally. V: Weak corneal on right, absent on left VII: Face appears symmetrical resting with ET tube present IX, X: Cough and gag intact XI: Head is midline XII: ETT in place  Motor/Sensory: RUE: Abnormal extensor posturing LUE: Abnormal extensor posturing BLE triple flexion  Coordination: Deferred Gait: Deferred  ASSESSMENT/PLAN  Garrett [name] is a 48 y.o. male with history of HTN admitted for ICH.    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 Basal cisterns and fourth ventricle  are effaced without hydrocephalus at this time.  CTA head & neck no LVO CT repeat 3/9 no substantial change  Repeat Ct head 3/18 Interval decrease in size of subacute pontine hemorrhage, Decreased regional mass effect, Interval decrease in scattered small volume subarachnoid and intraventricular hemorrhage. 2D Echo EF 60 to 65%, moderate concentric LVH, grade 1 diastolic dysfunction, normal left atrial size, no atrial level shunt LDL 96 HgbA1c 4.7 UDS positive for  opiates VTE prophylaxis - continue SQ lovenox No antithrombotic prior to admission, continue No antithrombotic secondary to ICH Therapy recommendations:  Pending Disposition:  pending trach   Brainstem compression Continued poor neurological exam 3% hypertonic off Continue FW 200 Q4 Na 154> 159 >157 >158->151->149->147->144  Hypertension Home meds:  None Stable Continue Norvasc, hydralazine, losartan, Lopressor Clonidine D/Cd in case of possible side effect of reduced sympathetic CNS nerve responses affecting neurological exam.  PRN labetalol  BP goal less than 160 Long-term BP goal normotensive  Hypoxic respiratory failure Aspiration PNA Intubated in the ED PCCM following, appreciate assitance SVT/VAP per CCM CXR Streaky left basilar opacity  Tmax 101.5->100.4->102.1->100.9->afebrile WBC 20.4-> 9.8 -> 13.2-> 18.1-14.1--15.5->18.3->13.2 Unasyn completed Family would like to wait full 14 days before placing trach -> likely early next week  Dysphagia Coretrak in place right nare Continue tube feeding and free water 200 cc Q4h  Substance use UDS positive for opioids Narcan given in ED  Other Active Problems Hypokalemia: K 2.6 -> 4.2-> 3.3-> 3.4->4.1-> 3.5-> 3.8 -> 4.1 AKI, Cr 2.2-> 1.54 > 1.38> 1.37-1.71-1.56->1.16->1.75 Hypermagnesia: 3.0->2.9->3.0 Hyperphosphatemia: 4.9-4.8  Hospital day # 12   Pt seen by Neuro NP/APP and later by MD. Note/plan to be edited by MD as needed.    [name], DNP, AGACNP-BC [facility] Please use AMION for contact information & EPIC for messaging.  ATTENDING NOTE: I reviewed above note and agree with the assessment and plan. Pt was seen and examined.   Fiance and mom at the bedside. Pt lying in bed, still intubated, eyes half way open but not tracking, not following commands, slight finger random movement on the left hand. Family would like to wait for full 14 days (Saturday) before trach, likely will be next week,  continue current management.   For detailed assessment and plan, please refer to above/below as I have made changes wherever appropriate.   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 This patient's care requires constant monitoring of vital signs, hemodynamics, respiratory and cardiac monitoring, review of multiple databases, neurological assessment, discussion with family, other specialists and medical decision making of high complexity. I spent 35 minutes of neurocritical care time in the care of this patient. I had long discussion with fiance and mom at bedside, updated pt current condition, treatment plan and potential prognosis, and answered all the questions.  THey expressed understanding and appreciation.

## 2023-11-08 NOTE — Progress Notes (Signed)
 Nutrition Follow-up  DOCUMENTATION CODES:   Not applicable  INTERVENTION:   Tube feeding via Cortrak tube: Decrease Osmolite 1.5 to 50 ml/h (1200 ml per day)  Increase Prosource TF20 60 ml TID  Provides 2160 kcal, 123 gm protein, 1003 ml free water daily  200 ml free water every 4 hours Total free water: 2112 ml    NUTRITION DIAGNOSIS:   Inadequate oral intake related to inability to eat as evidenced by NPO status. Ongoing.   GOAL:   Patient will meet greater than or equal to 90% of their needs Met with TF at goal  MONITOR:   TF tolerance, I & O's  REASON FOR ASSESSMENT:   Consult Enteral/tube feeding initiation and management  ASSESSMENT:   Pt with PMH of HTN, daily mariajuana, and smoker admitted after being found down at home with pontine hemorrhagic stroke and trace SAH. UDS positive for opioids. Noted aspiration PNA on admission.  Pt discussed during ICU rounds and with RN and MD.  Per MD not awake for RN/MD yesterday or today. Plan to proceed with trach placement.    3/8 - admitted with [facility], intubated 3/9 - Adult TF protocol started 3/14 - s/p cortrak placement, tip gastric    Medications reviewed and include: pepcid, novolog SSI every 4 hours, miralax (3/20) Banatrol d/c'ed 3/20  Labs reviewed:  BUN/Cr rising 67/1.75 - free water added A1C: 4.7 CBG's: 115-164   UOP: 1900 ml  Diet Order:   Diet Order     None       EDUCATION NEEDS:   No education needs have been identified at this time  Skin:  Skin Assessment: Reviewed RN Assessment  Last BM:  3/17; FMS d/c'ed 3/18  Height:   Ht Readings from Last 1 Encounters:  11/05/23 5' 7.99" (1.727 m)    Weight:   Wt Readings from Last 1 Encounters:  11/08/23 106.9 kg    Ideal Body Weight:  70 kg  BMI:  Body mass index is 35.84 kg/m.  Estimated Nutritional Needs:   Kcal:  2000-2200  Protein:  125-140 grams  Fluid:  >2 L/day  Cammy Copa., RD, LDN, CNSC See AMiON for  contact information

## 2023-11-08 NOTE — Progress Notes (Signed)
 Telephone consent obtained by Dr. Vassie Loll for tracheostomy from mother, Garrett [name], witnessed by this RN. Consent form placed in shadow chart.

## 2023-11-08 NOTE — Progress Notes (Signed)
 Palliative-   Chart reviewed. Noted family requesting 14 days on vent.   Palliative offered repeat family meeting earlier this week and family declined.   Will continue to follow.   Ocie Bob, AGNP-C Palliative Medicine  No charge

## 2023-11-09 DIAGNOSIS — G935 Compression of brain: Secondary | ICD-10-CM | POA: Diagnosis not present

## 2023-11-09 DIAGNOSIS — I629 Nontraumatic intracranial hemorrhage, unspecified: Secondary | ICD-10-CM

## 2023-11-09 DIAGNOSIS — I10 Essential (primary) hypertension: Secondary | ICD-10-CM | POA: Diagnosis not present

## 2023-11-09 DIAGNOSIS — J690 Pneumonitis due to inhalation of food and vomit: Secondary | ICD-10-CM | POA: Diagnosis not present

## 2023-11-09 DIAGNOSIS — I161 Hypertensive emergency: Secondary | ICD-10-CM | POA: Diagnosis not present

## 2023-11-09 DIAGNOSIS — J9601 Acute respiratory failure with hypoxia: Secondary | ICD-10-CM | POA: Diagnosis not present

## 2023-11-09 DIAGNOSIS — I613 Nontraumatic intracerebral hemorrhage in brain stem: Secondary | ICD-10-CM | POA: Diagnosis not present

## 2023-11-09 LAB — BASIC METABOLIC PANEL
Anion gap: 9 (ref 5–15)
BUN: 58 mg/dL — ABNORMAL HIGH (ref 6–20)
CO2: 24 mmol/L (ref 22–32)
Calcium: 8.4 mg/dL — ABNORMAL LOW (ref 8.9–10.3)
Chloride: 113 mmol/L — ABNORMAL HIGH (ref 98–111)
Creatinine, Ser: 1.26 mg/dL — ABNORMAL HIGH (ref 0.61–1.24)
GFR, Estimated: >60 mL/min (ref >60)
Glucose, Bld: 129 mg/dL — ABNORMAL HIGH (ref 70–99)
Potassium: 3.7 mmol/L (ref 3.5–5.1)
Sodium: 146 mmol/L — ABNORMAL HIGH (ref 135–145)

## 2023-11-09 LAB — GLUCOSE, CAPILLARY
Glucose-Capillary: 106 mg/dL — ABNORMAL HIGH (ref 70–99)
Glucose-Capillary: 112 mg/dL — ABNORMAL HIGH (ref 70–99)
Glucose-Capillary: 112 mg/dL — ABNORMAL HIGH (ref 70–99)
Glucose-Capillary: 112 mg/dL — ABNORMAL HIGH (ref 70–99)
Glucose-Capillary: 130 mg/dL — ABNORMAL HIGH (ref 70–99)
Glucose-Capillary: 152 mg/dL — ABNORMAL HIGH (ref 70–99)

## 2023-11-09 LAB — CBC WITH DIFFERENTIAL/PLATELET
Abs Immature Granulocytes: 0.06 10*3/uL (ref 0.00–0.07)
Basophils Absolute: 0 10*3/uL (ref 0.0–0.1)
Basophils Relative: 0 %
Eosinophils Absolute: 0.2 10*3/uL (ref 0.0–0.5)
Eosinophils Relative: 2 %
HCT: 31.7 % — ABNORMAL LOW (ref 39.0–52.0)
Hemoglobin: 9.8 g/dL — ABNORMAL LOW (ref 13.0–17.0)
Immature Granulocytes: 1 %
Lymphocytes Relative: 15 %
Lymphs Abs: 2 10*3/uL (ref 0.7–4.0)
MCH: 31.4 pg (ref 26.0–34.0)
MCHC: 30.9 g/dL (ref 30.0–36.0)
MCV: 101.6 fL — ABNORMAL HIGH (ref 80.0–100.0)
Monocytes Absolute: 1.1 10*3/uL — ABNORMAL HIGH (ref 0.1–1.0)
Monocytes Relative: 8 %
Neutro Abs: 9.9 10*3/uL — ABNORMAL HIGH (ref 1.7–7.7)
Neutrophils Relative %: 74 %
Platelets: 376 10*3/uL (ref 150–400)
RBC: 3.12 MIL/uL — ABNORMAL LOW (ref 4.22–5.81)
RDW: 13.9 % (ref 11.5–15.5)
WBC: 13.3 10*3/uL — ABNORMAL HIGH (ref 4.0–10.5)
nRBC: 0 % (ref 0.0–0.2)

## 2023-11-09 LAB — PHOSPHORUS: Phosphorus: 3.6 mg/dL (ref 2.5–4.6)

## 2023-11-09 LAB — MAGNESIUM: Magnesium: 3.2 mg/dL — ABNORMAL HIGH (ref 1.7–2.4)

## 2023-11-09 MED ORDER — ARTIFICIAL TEARS OPHTHALMIC OINT
TOPICAL_OINTMENT | OPHTHALMIC | Status: DC | PRN
  Administered 2023-11-09 – 2023-11-10 (×3): 1 via OPHTHALMIC

## 2023-11-09 MED ORDER — BETHANECHOL CHLORIDE 10 MG PO TABS: 10.0000 mg | ORAL_TABLET | Freq: Four times a day (QID) | ORAL | Status: DC

## 2023-11-09 MED ORDER — BETHANECHOL CHLORIDE 10 MG PO TABS
10.0000 mg | ORAL_TABLET | Freq: Four times a day (QID) | ORAL | Status: DC
  Administered 2023-11-09 (×2): 10 mg
  Filled 2023-11-09 (×3): qty 1

## 2023-11-09 NOTE — TOC CM/SW Note (Signed)
 Transition of Care [facility]) - Inpatient Brief Assessment   Patient Details  Name: Garrett [name] MRN: [id number] Date of Birth: 07-Jun-1976  Transition of Care [facility]) CM/SW Contact:    Mearl Latin, LCSW Phone Number: 11/09/2023, 5:47 PM   Clinical Narrative: TOC continuing to follow. Incapacity letter submitted to Financial counseling as requested.    Transition of Care Asessment: Insurance and Status: Selfpay Patient has primary care physician: No Home environment has been reviewed: From home Prior level of function:: Independent Prior/Current Home Services: No current home services Social Drivers of Health Review: SDOH reviewed no interventions necessary Readmission risk has been reviewed: Yes Transition of care needs: no transition of care needs at this time

## 2023-11-09 NOTE — TOC CM/SW Note (Signed)
 Transition of Care [facility]) - Inpatient Brief Assessment   Patient Details  Name: Garrett [name] MRN: [id number] Date of Birth: 1976/02/22  Transition of Care [facility]) CM/SW Contact:    Mearl Latin, LCSW Phone Number: 11/09/2023, 5:47 PM   Clinical Narrative: TOC continuing to follow.    Transition of Care Asessment: Insurance and Status: Selfpay Patient has primary care physician: No Home environment has been reviewed: From home Prior level of function:: Independent Prior/Current Home Services: No current home services Social Drivers of Health Review: SDOH reviewed no interventions necessary Readmission risk has been reviewed: Yes Transition of care needs: no transition of care needs at this time

## 2023-11-09 NOTE — Progress Notes (Addendum)
 STROKE TEAM PROGRESS NOTE   INTERIM HISTORY/SUBJECTIVE  Girlfriend at the bedside.  Patient remains intubated on no sedation.  Labs this morning mag 3.2, WBC 13.3, creatinine 1.26, Tmax overnight 100.6  Logical exam remains the same and unchanged.  Pupils are equal at 0.5 mm and non reactive, minimal eye movements this morning, is not following any commands this morning, minimal withdrawal noted on bilateral lower extremities, right arm with extensor posturing.  Family member at bedside showed video of him following commands overnight with thumbs up and showing 2 fingers.   Trach possibly scheduled for tomorrow  OBJECTIVE CBC    Component Value Date/Time   WBC 13.3 (H) 11/09/2023 0544   RBC 3.12 (L) 11/09/2023 0544   HGB 9.8 (L) 11/09/2023 0544   HCT 31.7 (L) 11/09/2023 0544   PLT 376 11/09/2023 0544   MCV 101.6 (H) 11/09/2023 0544   MCH 31.4 11/09/2023 0544   MCHC 30.9 11/09/2023 0544   RDW 13.9 11/09/2023 0544   LYMPHSABS 2.0 11/09/2023 0544   MONOABS 1.1 (H) 11/09/2023 0544   EOSABS 0.2 11/09/2023 0544   BASOSABS 0.0 11/09/2023 0544   BMET    Component Value Date/Time   NA 146 (H) 11/09/2023 0544   K 3.7 11/09/2023 0544   CL 113 (H) 11/09/2023 0544   CO2 24 11/09/2023 0544   GLUCOSE 129 (H) 11/09/2023 0544   BUN 58 (H) 11/09/2023 0544   CREATININE 1.26 (H) 11/09/2023 0544   CALCIUM 8.4 (L) 11/09/2023 0544   GFRNONAA >60 11/09/2023 0544   IMAGING past 24 hours No results found.     Vitals:   11/09/23 0900 11/09/23 1000 11/09/23 1100 11/09/23 1200  BP: 127/73 (!) 131/105 (!) 140/88   Pulse: 83 (!) 113 93   Resp: (!) 29 20 (!) 29   Temp:    99.5 F (37.5 C)  TempSrc:    Axillary  SpO2: 100% 97% 100%   Weight:      Height:       PHYSICAL EXAM General: Intubated patient in no acute distress CV: Regular rate and rhythm on monitor Respiratory: Mechanically ventilated  NEURO (off of sedation since 3/14):  Mental Status:Obtunded, does not open eyes to voice  or noxious stimuli, does not follow commands.  He apparently will follow commands intermittently as we were shown a video by family of him following commands throughout the night Cranial Nerves:  II: Pupils pinpoint, nonreactive III, IV, VI: Eyes a with slightly upward gaze, he does not blink to threat bilaterally. V: Weak corneal on right, absent on left VII: Face appears symmetrical resting with ET tube present IX, X: Cough and gag intact XI: Head is midline XII: ETT in place  Motor/Sensory: RUE: Abnormal extensor posturing LUE: Abnormal extensor posturing BLE t minimal withdrawal to noxious stimuli today  Coordination: Deferred Gait: Deferred  ASSESSMENT/PLAN  [name] is a 48 y.o. male with history of HTN admitted for ICH.    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 Basal cisterns and fourth ventricle are effaced without hydrocephalus at this time.  CTA head & neck no LVO CT repeat 3/9 no substantial change  Repeat Ct head 3/18 Interval decrease in size of subacute pontine hemorrhage, Decreased regional mass effect, Interval decrease in scattered small volume subarachnoid and intraventricular hemorrhage.  2D Echo EF 60 to 65%, moderate concentric LVH, grade 1 diastolic dysfunction, normal left atrial size, no atrial level shunt LDL 96 HgbA1c 4.7 UDS positive for opiates VTE prophylaxis - continue SQ lovenox No antithrombotic prior to admission, continue No antithrombotic secondary to ICH Therapy recommendations:  Pending Disposition:  pending trach   Brainstem compression Continued poor neurological exam 3% hypertonic off Continue FW 200 Q4 Na 154> 159 >157 >158->151->149->147->144->146  Hypertension Home meds:   None Stable Continue Norvasc, hydralazine, losartan, Lopressor Clonidine D/Cd in case of possible side effect of reduced sympathetic CNS nerve responses affecting neurological exam.  PRN labetalol  BP goal less than 160 Long-term BP goal normotensive  Hypoxic respiratory failure Aspiration PNA Intubated in the ED PCCM following, appreciate assitance SVT/VAP per CCM CXR Streaky left basilar opacity  Tmax 101.5->100.4->102.1->100.9->100.6 WBC 20.4-> 9.8 -> 13.2-> 18.1-14.1--15.5->18.3->13.2-13.3 Unasyn completed Family would like to wait full 14 days before placing trach -> likely tomorrow or early next week  Dysphagia Coretrak in place right nare Continue tube feeding and free water 200 cc Q4h  Substance use UDS positive for opioids Narcan given in ED  Other Active Problems Hypokalemia: K 2.6 -> 4.2-> 3.3-> 3.4->4.1-> 3.5-> 3.8 -> 4.1->3.7 AKI, Cr 2.2-> 1.54 > 1.38> 1.37-1.71-1.56->1.16->1.75->1.26 Hypermagnesia: 3.0->2.9->3.0->3.2 Hyperphosphatemia: 4.9-4.8-3.6  Hospital day # 13   Pt seen by Neuro NP/APP and later by MD. Note/plan to be edited by MD as needed.     Gevena Mart DNP, ACNPC-AG  [facility]  ATTENDING NOTE: I reviewed above note and agree with the assessment and plan. Pt was seen and examined.   Family at bedside.  Patient eyes halfway open, however did not follow simple commands on the left hand and foot.  However fails to show me a video recording last night that he seemed followed some simple commands and left hand.  Vital stable, creatinine improved, no fever.  Pending tracheostomy tomorrow or early next week.  Appreciate CCM assistance.  Continue current management.  For detailed assessment and plan, please refer to above/below as I have made changes wherever appropriate.   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 This patient's care requires constant monitoring of vital signs, hemodynamics, respiratory and cardiac monitoring, review of multiple databases, neurological assessment, discussion with family, other specialists and medical decision making of high complexity. I spent 35 minutes of neurocritical care time in the care of this patient. I had long discussion with fiance at bedside, updated pt current condition, treatment plan and potential prognosis, and answered all the questions.  She expressed understanding and appreciation.  I also discussed with Dr. Francine Graven CCM

## 2023-11-09 NOTE — Progress Notes (Signed)
 NAME:  Kaylee Wombles, MRN:  [id number], DOB:  07-03-1976, LOS: 13 ADMISSION DATE:  10/27/2023, CONSULTATION DATE:  10/27/2023 REFERRING MD:  Coralee Pesa, DO CHIEF COMPLAINT:  Found Down  History of Present Illness:   48 y/o male with h/o HTN who was found down for unknown downtime by his [place] when she came home from work, he was having agonal breathing.  He apparently had driven her to work this morning.  He has HTN but never checks his BP and does not follow with MD.  She says you can tell when his BP is really high; his eyes get blood shot and he is sweating.  No h/o seizures.  Besides almost daily [name] and cigarette smoking, she is not aware of any other drugs. Drug screen positive for opioids and he was given several doses of Narcan. Patient found to have:  1. 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 Basal cisterns and fourth ventricle are effaced without hydrocephalus at this time. 2. Trace additional subarachnoid hemorrhage along the left parietal convexity. BP in ED 262/156. His cxr revealed a RUL and perihilar infiltrates suggesting aspiration pneumonia. ED labs also revealed PH 7.169, LA 4.4, CPK 985. Patient was intubated in ED.  Pertinent  Medical History  HTN  Significant Hospital Events: Including procedures, antibiotic start and stop dates in addition to other pertinent events   3/8: Intubated in ED, pontine bleed/SAH. CT head 17:09 Acute large hemorrhage 4.1 cm in pons with intraventricular extension without hydrocephalus 3/9: CTA 03:14 AM No change in IPH in pons, similar biparietal [facility] 3/14: Now awake, appears locked in can follow commands with eyes. 3/16 Purposeful with left upper extremity   Interim History / Subjective:   Plan for tracheostomy tomorrow, discussed with fiance and mother No acute events overnight. Intermittently following  commands with left hand   Objective   Blood pressure (!) 140/88, pulse 93, temperature 99.2 F (37.3 C), temperature source Axillary, resp. rate (!) 29, height 5' 7.99" (1.727 m), weight 107.1 kg, SpO2 100%.    Vent Mode: PSV;CPAP FiO2 (%):  [30 %-40 %] 30 % Set Rate:  [18 bmp] 18 bmp Vt Set:  [550 mL] 550 mL PEEP:  [5 cmH20] 5 cmH20 Pressure Support:  [5 cmH20] 5 cmH20 Plateau Pressure:  [16 cmH20] 16 cmH20   Intake/Output Summary (Last 24 hours) at 11/09/2023 1143 Last data filed at 11/09/2023 1100 Gross per 24 hour  Intake 2396.16 ml  Output 1905 ml  Net 491.16 ml   Filed Weights   11/07/23 0200 11/08/23 0500 11/09/23 0500  Weight: 105.8 kg 106.9 kg 107.1 kg   Physical Exam: Gen:      No acute distress, obese. HEENT:  moist mucous membranes, sclera anicteric Neck:     ET tube, small bore feeding tube in place Lungs:    Bilateral ventilated breath sounds, no accessory muscle use, on pressure support weaning CV:        S1-S2 regular Abd:      Abdomen soft.,  Bowel sounds present Ext:    No edema Skin:      Warm and dry; no rash Neuro: intermittent left hand movement to commands, intermittent posturing,   Labs show normal sodium, decreased leukocytosis, stable anemia  Ancillary Tests Personally Reviewed:   CT head on 3/9 shows extensive hemorrhage involving better part of the brainstem.  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  -Maintain minimal sedation - Target SBP 130-150.   Acute hypoxic respiratory failure due to aspiration pneumonia.  - Continue spontaneous breathing trials - Doubt he will be able to protect airway with extubation, discussed pros and cons of trial of extubation versus proceeding with tracheostomy today.  Mom and fianc now agreeable to proceed with tracheostomy, consent obtained - plan for tracheostomy tomorrow  Hypertensive emergency -Due to rising creatinine DC'd  irbesartan and chlorthalidone  -Continue metoprolol, hydralazine, amlodipine   AKI  -iatrogenic hypernatremia-resolving -continue free water -Hold further Lasix  Fever  -could possibly be central, but with leukocytosis, have to remain vigilant for infection. Clear chest x-ray and urinalysis >>reassuring  Summary -His neurological improvement has plateaued. recovery is likely to be partial but given patient's young age, it may be worth giving him more time to see if a reasonable functional recovery is possible.  Would favor proceeding with tracheostomy Mom & Fianc updated   Best Practice (right click and "Reselect all SmartList Selections" daily)   Diet/type: tubefeeds DVT prophylaxis LMWH Pressure ulcer(s): N/A GI prophylaxis: H2B Lines: N/A Foley:  external catheter.  Code Status:  DNR with pre-arrest interventions    CRITICAL CARE Performed by: Martina Sinner   Total critical care time: 40 minutes  Critical care time was exclusive of separately billable procedures and treating other patients.  Critical care was necessary to treat or prevent imminent or life-threatening deterioration.  Critical care was time spent personally by me on the following activities: development of treatment plan with patient and/or surrogate as well as nursing, discussions with consultants, evaluation of patient's response to treatment, examination of patient, obtaining history from patient or surrogate, ordering and performing treatments and interventions, ordering and review of laboratory studies, ordering and review of radiographic studies, pulse oximetry, re-evaluation of patient's condition and participation in multidisciplinary rounds.  Melody Comas, MD [place] Office: [phone]   See Amion for personal pager PCCM on call pager [phone] until 7pm. Please call Elink 7p-7a. [phone]

## 2023-11-10 ENCOUNTER — Inpatient Hospital Stay (HOSPITAL_COMMUNITY): Payer: MEDICAID

## 2023-11-10 DIAGNOSIS — R1311 Dysphagia, oral phase: Secondary | ICD-10-CM | POA: Diagnosis not present

## 2023-11-10 DIAGNOSIS — I69891 Dysphagia following other cerebrovascular disease: Secondary | ICD-10-CM | POA: Diagnosis not present

## 2023-11-10 DIAGNOSIS — I629 Nontraumatic intracranial hemorrhage, unspecified: Secondary | ICD-10-CM | POA: Diagnosis not present

## 2023-11-10 DIAGNOSIS — Z515 Encounter for palliative care: Secondary | ICD-10-CM | POA: Diagnosis not present

## 2023-11-10 DIAGNOSIS — I10 Essential (primary) hypertension: Secondary | ICD-10-CM | POA: Diagnosis not present

## 2023-11-10 DIAGNOSIS — J690 Pneumonitis due to inhalation of food and vomit: Secondary | ICD-10-CM | POA: Diagnosis not present

## 2023-11-10 DIAGNOSIS — I613 Nontraumatic intracerebral hemorrhage in brain stem: Secondary | ICD-10-CM | POA: Diagnosis not present

## 2023-11-10 DIAGNOSIS — Z7189 Other specified counseling: Secondary | ICD-10-CM | POA: Diagnosis not present

## 2023-11-10 DIAGNOSIS — J9601 Acute respiratory failure with hypoxia: Secondary | ICD-10-CM | POA: Diagnosis not present

## 2023-11-10 LAB — GLUCOSE, CAPILLARY
Glucose-Capillary: 113 mg/dL — ABNORMAL HIGH (ref 70–99)
Glucose-Capillary: 103 mg/dL — ABNORMAL HIGH (ref 70–99)
Glucose-Capillary: 100 mg/dL — ABNORMAL HIGH (ref 70–99)
Glucose-Capillary: 129 mg/dL — ABNORMAL HIGH (ref 70–99)
Glucose-Capillary: 129 mg/dL — ABNORMAL HIGH (ref 70–99)
Glucose-Capillary: 141 mg/dL — ABNORMAL HIGH (ref 70–99)

## 2023-11-10 LAB — BASIC METABOLIC PANEL
Anion gap: 7 (ref 5–15)
BUN: 50 mg/dL — ABNORMAL HIGH (ref 6–20)
CO2: 27 mmol/L (ref 22–32)
Calcium: 8.7 mg/dL — ABNORMAL LOW (ref 8.9–10.3)
Chloride: 114 mmol/L — ABNORMAL HIGH (ref 98–111)
Creatinine, Ser: 1.2 mg/dL (ref 0.61–1.24)
GFR, Estimated: >60 mL/min (ref >60)
Glucose, Bld: 115 mg/dL — ABNORMAL HIGH (ref 70–99)
Potassium: 3.9 mmol/L (ref 3.5–5.1)
Sodium: 148 mmol/L — ABNORMAL HIGH (ref 135–145)

## 2023-11-10 LAB — MAGNESIUM: Magnesium: 3.2 mg/dL — ABNORMAL HIGH (ref 1.7–2.4)

## 2023-11-10 MED ORDER — LIDOCAINE-EPINEPHRINE 1 %-1:100000 IJ SOLN
20.0000 mL | Freq: Once | INTRAMUSCULAR | Status: AC
  Administered 2023-11-10: 20 mL via INTRADERMAL
  Filled 2023-11-10: qty 1

## 2023-11-10 MED ORDER — FENTANYL CITRATE PF 50 MCG/ML IJ SOSY
200.0000 ug | PREFILLED_SYRINGE | Freq: Once | INTRAMUSCULAR | Status: AC
  Administered 2023-11-10: 200 ug via INTRAVENOUS
  Filled 2023-11-10: qty 4

## 2023-11-10 MED ORDER — ROCURONIUM BROMIDE 10 MG/ML (PF) SYRINGE
100.0000 mg | PREFILLED_SYRINGE | Freq: Once | INTRAVENOUS | Status: AC
  Administered 2023-11-10: 100 mg via INTRAVENOUS
  Filled 2023-11-10: qty 10

## 2023-11-10 MED ORDER — ENOXAPARIN SODIUM 60 MG/0.6ML IJ SOSY
60.0000 mg | PREFILLED_SYRINGE | Freq: Every day | INTRAMUSCULAR | Status: DC
  Administered 2023-11-11: 60 mg via SUBCUTANEOUS
  Filled 2023-11-10 (×2): qty 0.6

## 2023-11-10 MED ORDER — FREE WATER
300.0000 mL | Status: DC
  Administered 2023-11-10 – 2023-11-12 (×11): 300 mL

## 2023-11-10 MED ORDER — MIDAZOLAM HCL 2 MG/2ML IJ SOLN
5.0000 mg | Freq: Once | INTRAMUSCULAR | Status: AC
  Administered 2023-11-10: 6 mg via INTRAVENOUS
  Filled 2023-11-10: qty 6

## 2023-11-10 MED ORDER — ETOMIDATE 2 MG/ML IV SOLN
20.0000 mg | Freq: Once | INTRAVENOUS | Status: AC
  Administered 2023-11-10: 20 mg via INTRAVENOUS
  Filled 2023-11-10: qty 10

## 2023-11-10 MED ORDER — BETHANECHOL CHLORIDE 25 MG PO TABS
25.0000 mg | ORAL_TABLET | Freq: Four times a day (QID) | ORAL | Status: AC
  Administered 2023-11-10 – 2023-11-12 (×11): 25 mg
  Filled 2023-11-10 (×12): qty 1

## 2023-11-10 NOTE — Procedures (Signed)
 Diagnostic Bronchoscopy  Abhiram Criado  [id number]  [birth year]  Date:11/10/23  Time:2:48 PM   Provider Performing:Latanza Pfefferkorn B Johnatha Zeidman   Procedure: Diagnostic Bronchoscopy (13244)  Indication(s) Assist with direct visualization of tracheostomy placement  Consent Risks of the procedure as well as the alternatives and risks of each were explained to the patient and/or caregiver.  Consent for the procedure was obtained.   Anesthesia See separate tracheostomy note   Time Out Verified patient identification, verified procedure, site/side was marked, verified correct patient position, special equipment/implants available, medications/allergies/relevant history reviewed, required imaging and test results available.   Sterile Technique Usual hand hygiene, masks, gowns, and gloves were used   Procedure Description Bronchoscope advanced through endotracheal tube and into airway.  After suctioning out tracheal secretions, bronchoscope used to provide direct visualization of tracheostomy placement.   Complications/Tolerance None; patient tolerated the procedure well.   EBL None  Specimen(s) BAL RUL sent

## 2023-11-10 NOTE — Progress Notes (Signed)
 NAME:  Garrett [name], MRN:  [id number], DOB:  May 21, 1976, LOS: 14 ADMISSION DATE:  10/27/2023, CONSULTATION DATE:  10/27/2023 REFERRING MD:  Garrett Pesa, DO CHIEF COMPLAINT:  Found Down  History of Present Illness:   48 y/o male with h/o HTN who was found down for unknown downtime by his Garrett [name] when she came home from work, he was having agonal breathing.  He apparently had driven her to work this morning.  He has HTN but never checks his BP and does not follow with MD.  She says you can tell when his BP is really high; his eyes get blood shot and he is sweating.  No h/o seizures.  Besides almost daily Garrett [name] and cigarette smoking, she is not aware of any other drugs. Drug screen positive for opioids and he was given several doses of Narcan. Patient found to have:  1. 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 Basal cisterns and fourth ventricle are effaced without hydrocephalus at this time. 2. Trace additional subarachnoid hemorrhage along the left parietal convexity. BP in ED 262/156. His cxr revealed a RUL and perihilar infiltrates suggesting aspiration pneumonia. ED labs also revealed PH 7.169, LA 4.4, CPK 985. Patient was intubated in ED.  Pertinent  Medical History  HTN  Significant Hospital Events: Including procedures, antibiotic start and stop dates in addition to other pertinent events   3/8: Intubated in ED, pontine bleed/SAH. CT head 17:09 Acute large hemorrhage 4.1 cm in pons with intraventricular extension without hydrocephalus 3/9: CTA 03:14 AM No change in IPH in pons, similar biparietal [facility] 3/14: Now awake, appears locked in can follow commands with eyes. 3/16 Purposeful with left upper extremity   Interim History / Subjective:   Continues to have urinary retention Plan for tracheostomy this afternoon  Objective   Blood pressure 113/70, pulse  91, temperature 99 F (37.2 C), temperature source Axillary, resp. rate 20, height 5' 7.99" (1.727 m), weight 107.4 kg, SpO2 99%.    Vent Mode: PSV;CPAP FiO2 (%):  [30 %] 30 % Set Rate:  [18 bmp] 18 bmp Vt Set:  [550 mL] 550 mL PEEP:  [5 cmH20] 5 cmH20 Pressure Support:  [5 cmH20-8 cmH20] 8 cmH20 Plateau Pressure:  [22 cmH20] 22 cmH20   Intake/Output Summary (Last 24 hours) at 11/10/2023 0925 Last data filed at 11/10/2023 0530 Gross per 24 hour  Intake 1500 ml  Output 2325 ml  Net -825 ml   Filed Weights   11/08/23 0500 11/09/23 0500 11/10/23 0500  Weight: 106.9 kg 107.1 kg 107.4 kg   Physical Exam: Gen:      No acute distress, obese. HEENT:  moist mucous membranes, sclera anicteric Neck:     ET tube, small bore feeding tube in place Lungs:    Bilateral ventilated breath sounds, no accessory muscle use, on pressure support weaning CV:        S1-S2 regular Abd:      Abdomen soft.,  Bowel sounds present Ext:    No edema Skin:      Warm and dry; no rash Neuro: PERRL, not following commands this morning  Ancillary Tests Personally Reviewed:   CT head on 3/9 shows extensive hemorrhage involving better part of the brainstem.  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  -Maintain minimal sedation - Target SBP 130-150.   Acute hypoxic respiratory  failure due to aspiration pneumonia.  - Continue spontaneous breathing trials - plan for tracheostomy tracheostomy today  Hypertension -Continue metoprolol, hydralazine, amlodipine   AKI  -iatrogenic hypernatremia-resolving -continue free water -Hold further Lasix  Fever  -could possibly be central, but with leukocytosis, have to remain vigilant for infection. Clear chest x-ray and urinalysis >>reassuring  Summary -His neurological improvement has plateaued. recovery is likely to be partial but given patient's young age, it may be worth giving him more time to  see if a reasonable functional recovery is possible.  Proceeding with trachoestomy today, consult to [facility] placed for LTACH evaluation  Best Practice (right click and "Reselect all SmartList Selections" daily)   Diet/type: tubefeeds DVT prophylaxis LMWH Pressure ulcer(s): N/A GI prophylaxis: H2B Lines: N/A Foley:  external catheter.  Code Status:  DNR with pre-arrest interventions    CRITICAL CARE Performed by: Martina Sinner   Total critical care time: 35 minutes  Critical care time was exclusive of separately billable procedures and treating other patients.  Critical care was necessary to treat or prevent imminent or life-threatening deterioration.  Critical care was time spent personally by me on the following activities: development of treatment plan with patient and/or surrogate as well as nursing, discussions with consultants, evaluation of patient's response to treatment, examination of patient, obtaining history from patient or surrogate, ordering and performing treatments and interventions, ordering and review of laboratory studies, ordering and review of radiographic studies, pulse oximetry, re-evaluation of patient's condition and participation in multidisciplinary rounds.  Melody Comas, MD [place] [facility] Office: [phone]   See Amion for personal pager PCCM on call pager [phone] until 7pm. Please call Elink 7p-7a. [phone]

## 2023-11-10 NOTE — TOC CAGE-AID Note (Signed)
 Transition of Care [facility]) - CAGE-AID Screening   Patient Details  Name: Garrett [name] MRN: [id number] Date of Birth: 1976-03-17  [facility] CM/SW Contact:    Janora Norlander, RN Phone Number: [phone] 11/10/2023, 1:27 PM   Clinical Narrative: Pt in the hospital after suffering from an ICH.  Pt has been consulted to general surgery due to needing a PEG tube placed.  Pt is unresponsive and unable to answer questions.  Screening unable to be completed.    CAGE-AID Screening: Substance Abuse Screening unable to be completed due to: : Patient unable to participate

## 2023-11-10 NOTE — Progress Notes (Addendum)
 STROKE TEAM PROGRESS NOTE   INTERIM HISTORY/SUBJECTIVE  Girlfriend is at the bedside.  Patient remains intubated on no sedation Neurological exam remains unchanged.  He does have a disconjugate gaze and is not following commands and withdraws on bilateral lower extremities.  Vital signs stable. Likely will have trach done today and PEG tube placed next week  OBJECTIVE CBC    Component Value Date/Time   WBC 13.3 (H) 11/09/2023 0544   RBC 3.12 (L) 11/09/2023 0544   HGB 9.8 (L) 11/09/2023 0544   HCT 31.7 (L) 11/09/2023 0544   PLT 376 11/09/2023 0544   MCV 101.6 (H) 11/09/2023 0544   MCH 31.4 11/09/2023 0544   MCHC 30.9 11/09/2023 0544   RDW 13.9 11/09/2023 0544   LYMPHSABS 2.0 11/09/2023 0544   MONOABS 1.1 (H) 11/09/2023 0544   EOSABS 0.2 11/09/2023 0544   BASOSABS 0.0 11/09/2023 0544   BMET    Component Value Date/Time   NA 146 (H) 11/09/2023 0544   K 3.7 11/09/2023 0544   CL 113 (H) 11/09/2023 0544   CO2 24 11/09/2023 0544   GLUCOSE 129 (H) 11/09/2023 0544   BUN 58 (H) 11/09/2023 0544   CREATININE 1.26 (H) 11/09/2023 0544   CALCIUM 8.4 (L) 11/09/2023 0544   GFRNONAA >60 11/09/2023 0544   IMAGING past 24 hours No results found.     Vitals:   11/10/23 0600 11/10/23 0700 11/10/23 0800 11/10/23 0900  BP: 135/87 128/74 113/70 128/71  Pulse: 84 88 91 90  Resp: 18 18 20  (!) 30  Temp:   99 F (37.2 C)   TempSrc:   Axillary   SpO2: 98% 99% 99% 99%  Weight:      Height:       PHYSICAL EXAM General: Intubated patient in no acute distress CV: Regular rate and rhythm on monitor Respiratory: Mechanically ventilated  NEURO (off of sedation since 3/14):  Mental Status:Obtunded, does not open eyes to voice or noxious stimuli, does not follow commands.  Cranial Nerves:  II: Pupils pinpoint, nonreactive III, IV, VI: Eyes a with slightly upward gaze, disconjugate gaze he does not blink to threat bilaterally. V: Weak corneal on right, absent on left VII: Face appears  symmetrical resting with ET tube present IX, X: Cough and gag intact XI: Head is midline XII: ETT in place  Motor/Sensory: RUE: Abnormal extensor posturing LUE: Abnormal extensor posturing BLE  withdrawal to noxious stimuli today  Coordination: Deferred Gait: Deferred  ASSESSMENT/PLAN  [name] is a 48 y.o. male with history of HTN admitted for ICH.    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 Basal cisterns and fourth ventricle are effaced without hydrocephalus at this time.  CTA head & neck no LVO CT repeat 3/9 no substantial change  Repeat Ct head 3/18 Interval decrease in size of subacute pontine hemorrhage, Decreased regional mass effect, Interval decrease in scattered small volume subarachnoid and intraventricular hemorrhage. 2D Echo EF 60 to 65%, moderate concentric LVH, grade 1 diastolic dysfunction, normal left atrial size, no atrial level shunt LDL 96 HgbA1c 4.7 UDS positive for opiates VTE prophylaxis - continue SQ lovenox No antithrombotic prior to admission, continue No antithrombotic secondary to ICH Therapy recommendations:  Pending Disposition:  pending trach   Brainstem compression Continued poor neurological exam 3% hypertonic off Continue  FW 200 Q4 Na 154> 159 >157 >158->151->149->147->144->146  Hypertension Home meds:  None Stable Continue Norvasc, hydralazine, losartan, Lopressor Clonidine D/Cd in case of possible side effect of reduced sympathetic CNS nerve responses affecting neurological exam.  PRN labetalol  BP goal less than 160 Long-term BP goal normotensive  Hypoxic respiratory failure Aspiration PNA Intubated in the ED PCCM following, appreciate assitance SVT/VAP per CCM CXR Streaky left basilar  opacity  Tmax 101.5->100.4->102.1->100.9->100.6->100.1 WBC 20.4-> 9.8 -> 13.2-> 18.1-14.1--15.5->18.3->13.2-13.3 Unasyn completed Family would like to wait full 14 days before placing trach -> likely tomorrow or early next week  Dysphagia Coretrak in place right nare Continue tube feeding and free water 200 cc Q4h  Substance use UDS positive for opioids Narcan given in ED  Other Active Problems Hypokalemia: K 2.6 -> 4.2-> 3.3-> 3.4->4.1-> 3.5-> 3.8 -> 4.1->3.7 AKI, Cr 2.2-> 1.54 > 1.38> 1.37-1.71-1.56->1.16->1.75->1.26 Hypermagnesia: 3.0->2.9->3.0->3.2 Hyperphosphatemia: 4.9-4.8-3.6  Hospital day # 14   Pt seen by Neuro NP/APP and later by MD. Note/plan to be edited by MD as needed.     Gevena Mart DNP, ACNPC-AG  [facility]  I have personally obtained history,examined this patient, reviewed notes, independently viewed imaging studies, participated in medical decision making and plan of care.ROS completed by me personally and pertinent positives fully documented  I have made any additions or clarifications directly to the above note. Agree with note above.  Patient neurological exam remains quite poor but family has decided to continue life support and aggressive medical care.  Plan is to have tracheostomy today and PEG tube in the next few days.  Discussed with Dr. Francine Graven critical care medicine.  Discussed with Dr. Janee Morn, team for PEG.  Discussed with fianc at the bedside. This patient is critically ill and at significant risk of neurological worsening, death and care requires constant monitoring of vital signs, hemodynamics,respiratory and cardiac monitoring, extensive review of multiple databases, frequent neurological assessment, discussion with family, other specialists and medical decision making of high complexity.I have made any additions or clarifications directly to the above note.This critical care time does not reflect procedure time, or teaching time or  supervisory time of PA/NP/Med Resident etc but could involve care discussion time.  I spent 30 minutes of neurocritical care time  in the care of  this patient.      Delia Heady, MD Medical Director [facility] Pager: [phone] 11/10/2023 12:07 PM

## 2023-11-10 NOTE — Consult Note (Signed)
 Reason for Consult:PEG placement Referring Physician: Darcel Frane is an 48 y.o. male.  HPI: 48yo M with PMHx of HTN was admitted with a large acute pontine hemorrhage. He remains on the ventilator and CCM plans a trach today. Of note, he has PSHx of colectomy with colostomy and subsequent colostomy takedown in the past.   History reviewed. No pertinent past medical history.  History reviewed. No pertinent surgical history.  History reviewed. No pertinent family history.  Social History:  reports that he has been smoking cigarettes. He started smoking about 30 years ago. He has a 15.1 pack-year smoking history. He has never used smokeless tobacco. He reports that he does not currently use alcohol. He reports that he does not use drugs.  Allergies:  Allergies  Allergen Reactions   Tomato Hives, Itching and Rash    Medications: I have reviewed the patient's current medications.  Results for orders placed or performed during the hospital encounter of 10/27/23 (from the past 48 hours)  Glucose, capillary     Status: None   Collection Time: 11/08/23  3:39 PM  Result Value Ref Range   Glucose-Capillary 99 70 - 99 mg/dL    Comment: Glucose reference range applies only to samples taken after fasting for at least 8 hours.  Glucose, capillary     Status: Abnormal   Collection Time: 11/08/23  7:36 PM  Result Value Ref Range   Glucose-Capillary 136 (H) 70 - 99 mg/dL    Comment: Glucose reference range applies only to samples taken after fasting for at least 8 hours.  Glucose, capillary     Status: Abnormal   Collection Time: 11/08/23 11:27 PM  Result Value Ref Range   Glucose-Capillary 118 (H) 70 - 99 mg/dL    Comment: Glucose reference range applies only to samples taken after fasting for at least 8 hours.  Glucose, capillary     Status: Abnormal   Collection Time: 11/09/23  3:52 AM  Result Value Ref Range   Glucose-Capillary 112 (H) 70 - 99 mg/dL    Comment:  Glucose reference range applies only to samples taken after fasting for at least 8 hours.  CBC with Differential/Platelet     Status: Abnormal   Collection Time: 11/09/23  5:44 AM  Result Value Ref Range   WBC 13.3 (H) 4.0 - 10.5 K/uL   RBC 3.12 (L) 4.22 - 5.81 MIL/uL   Hemoglobin 9.8 (L) 13.0 - 17.0 g/dL   HCT 40.1 (L) 02.7 - 25.3 %   MCV 101.6 (H) 80.0 - 100.0 fL   MCH 31.4 26.0 - 34.0 pg   MCHC 30.9 30.0 - 36.0 g/dL   RDW 66.4 40.3 - 47.4 %   Platelets 376 150 - 400 K/uL   nRBC 0.0 0.0 - 0.2 %   Neutrophils Relative % 74 %   Neutro Abs 9.9 (H) 1.7 - 7.7 K/uL   Lymphocytes Relative 15 %   Lymphs Abs 2.0 0.7 - 4.0 K/uL   Monocytes Relative 8 %   Monocytes Absolute 1.1 (H) 0.1 - 1.0 K/uL   Eosinophils Relative 2 %   Eosinophils Absolute 0.2 0.0 - 0.5 K/uL   Basophils Relative 0 %   Basophils Absolute 0.0 0.0 - 0.1 K/uL   Immature Granulocytes 1 %   Abs Immature Granulocytes 0.06 0.00 - 0.07 K/uL    Comment: Performed at [facility], [address] [place], [name] [address]  Basic metabolic panel     Status:  Abnormal   Collection Time: 11/09/23  5:44 AM  Result Value Ref Range   Sodium 146 (H) 135 - 145 mmol/L   Potassium 3.7 3.5 - 5.1 mmol/L   Chloride 113 (H) 98 - 111 mmol/L   CO2 24 22 - 32 mmol/L   Glucose, Bld 129 (H) 70 - 99 mg/dL    Comment: Glucose reference range applies only to samples taken after fasting for at least 8 hours.   BUN 58 (H) 6 - 20 mg/dL   Creatinine, Ser 1.91 (H) 0.61 - 1.24 mg/dL   Calcium 8.4 (L) 8.9 - 10.3 mg/dL   GFR, Estimated >47 >82 mL/min    Comment: (NOTE) Calculated using the CKD-EPI Creatinine Equation (2021)    Anion gap 9 5 - 15    Comment: Performed at [facility], [address] [place], [name] [address]  Magnesium     Status: Abnormal   Collection Time: 11/09/23  5:44 AM  Result Value Ref Range   Magnesium 3.2 (H) 1.7 - 2.4 mg/dL    Comment: Performed at [facility], [address], [place], [name]  [address]  Phosphorus     Status: None   Collection Time: 11/09/23  5:44 AM  Result Value Ref Range   Phosphorus 3.6 2.5 - 4.6 mg/dL    Comment: Performed at [facility], [address], [place], [name] [address]  Glucose, capillary     Status: Abnormal   Collection Time: 11/09/23  8:06 AM  Result Value Ref Range   Glucose-Capillary 112 (H) 70 - 99 mg/dL    Comment: Glucose reference range applies only to samples taken after fasting for at least 8 hours.  Glucose, capillary     Status: Abnormal   Collection Time: 11/09/23 11:48 AM  Result Value Ref Range   Glucose-Capillary 152 (H) 70 - 99 mg/dL    Comment: Glucose reference range applies only to samples taken after fasting for at least 8 hours.  Glucose, capillary     Status: Abnormal   Collection Time: 11/09/23  4:00 PM  Result Value Ref Range   Glucose-Capillary 130 (H) 70 - 99 mg/dL    Comment: Glucose reference range applies only to samples taken after fasting for at least 8 hours.  Glucose, capillary     Status: Abnormal   Collection Time: 11/09/23  7:39 PM  Result Value Ref Range   Glucose-Capillary 112 (H) 70 - 99 mg/dL    Comment: Glucose reference range applies only to samples taken after fasting for at least 8 hours.  Glucose, capillary     Status: Abnormal   Collection Time: 11/09/23 11:51 PM  Result Value Ref Range   Glucose-Capillary 106 (H) 70 - 99 mg/dL    Comment: Glucose reference range applies only to samples taken after fasting for at least 8 hours.  Glucose, capillary     Status: Abnormal   Collection Time: 11/10/23  3:52 AM  Result Value Ref Range   Glucose-Capillary 113 (H) 70 - 99 mg/dL    Comment: Glucose reference range applies only to samples taken after fasting for at least 8 hours.  Glucose, capillary     Status: Abnormal   Collection Time: 11/10/23  7:29 AM  Result Value Ref Range   Glucose-Capillary 103 (H) 70 - 99 mg/dL    Comment: Glucose reference range applies only to samples taken after  fasting for at least 8 hours.  Glucose, capillary  Status: Abnormal   Collection Time: 11/10/23 11:49 AM  Result Value Ref Range   Glucose-Capillary 100 (H) 70 - 99 mg/dL    Comment: Glucose reference range applies only to samples taken after fasting for at least 8 hours.    No results found.  Review of Systems  Unable to perform ROS: Intubated   Blood pressure 129/86, pulse 91, temperature 99 F (37.2 C), temperature source Axillary, resp. rate (!) 23, height 5' 7.99" (1.727 m), weight 107.4 kg, SpO2 100%. Physical Exam HENT:     Head: Normocephalic.  Eyes:     Comments: Disconjugate gaze  Abdominal:     General: Abdomen is flat.     Palpations: Abdomen is soft.     Comments: Surgical scars midline and LLQ  Skin:    General: Skin is warm.  Neurological:     Comments: No movement of ext to stim     Assessment/Plan: Acute pontine hemorrhage on ventilator with need for enteral access. Plan bedside PEG 3/24. I spoke with his GF at the bedside and I will speak with his mother tomorrow.   Liz Malady 11/10/2023, 11:52 AM

## 2023-11-10 NOTE — Progress Notes (Signed)
                                                                                                                                                                                                          Palliative Medicine Progress Note   Patient Name: Garrett [name]       Date: 11/10/2023 DOB: 11/27/1975  Age: 48 y.o. MRN#: [id number] Attending Physician: Stroke, Md, MD Primary Care Physician: Pcp, No Admit Date: 10/27/2023  Reason for Consultation/Follow-up: {Reason for Consult:23484}  HPI/Patient Profile: 48 y.o. male  with past medical history of hypertension who presented to the ED on 10/27/2023 after he was found down at home by his fiance. He was found to have an acute large hemorrhage centered in the pons with intraventricular extension in the fourth ventricle and also extension into the basal cisterns. Patient was intubated in the ED.    Palliative Medicine has been consulted for goals of care in the setting of "large ICH".   Subjective: Chart reviewed including labs, progress notes, and vital signs. Update received from RN. Plan is for tracheostomy this afternoon.  Patient assessed. Note left upper extremity with intermittent movement (decorticate?). I spoke with his significant other/[name] at bedside. She confirms agreement with tracheostomy, and plan to allow time for outcomes.   Objective:  Physical Exam Constitutional:      General: He is not in acute distress.    Comments: Critically ill-appearing             Vital Signs: BP 129/86   Pulse 91   Temp 99 F (37.2 C) (Axillary)   Resp (!) 23   Ht 5' 7.99" (1.727 m)   Wt 107.4 kg   SpO2 100%   BMI 36.01 kg/m  SpO2: SpO2: 100 % O2 Device: O2 Device: Ventilator O2 Flow Rate:      Palliative Medicine Assessment & Plan   Assessment: Principal Problem:   ICH (intracerebral hemorrhage) (HCC) Active Problems:   Intracranial hemorrhage (HCC)    Recommendations/Plan: ***  Goals of Care and Additional  Recommendations: Limitations on Scope of Treatment: {Recommended Scope and Preferences:21019}  Code Status:   Prognosis:  {Palliative Care Prognosis:23504}  Discharge Planning: {Palliative dispostion:23505}  Care plan was discussed with ***  Thank you for allowing the Palliative Medicine Team to assist in the care of this patient.   ***   Merry Proud, NP   Please contact Palliative Medicine Team phone at [phone] for questions and concerns.  For individual providers, please see AMION.

## 2023-11-10 NOTE — Procedures (Signed)
 Percutaneous Tracheostomy Procedure Note   Rolan Wrightsman  [id number]  30-Oct-1975  Date:11/10/23  Time:2:44 PM   Provider Performing:Kaho Selle  Procedure: Percutaneous Tracheostomy with Bronchoscopic Guidance (82956)  Indication(s) Acute respiratory failure  Consent Risks of the procedure as well as the alternatives and risks of each were explained to the patient and/or caregiver.  Consent for the procedure was obtained.  Anesthesia Etomidate, Versed, Fentanyl, Vecuronium   Time Out Verified patient identification, verified procedure, site/side was marked, verified correct patient position, special equipment/implants available, medications/allergies/relevant history reviewed, required imaging and test results available.   Sterile Technique Maximal sterile technique including sterile barrier drape, hand hygiene, sterile gown, sterile gloves, mask, hair covering.    Procedure Description Appropriate anatomy identified by palpation.  Patient's neck prepped and draped in sterile fashion.  1% lidocaine with epinephrine was used to anesthetize skin overlying neck.  1.5cm incision made and blunt dissection performed until tracheal rings could be easily palpated.   Then a size Proximal XLT 8 Shiley tracheostomy was placed under bronchoscopic visualization using usual Seldinger technique and serial dilation.   Bronchoscope confirmed placement above the carina.  Tracheostomy was sutured in place with adhesive pad to protect skin under pressure.    Patient connected to ventilator.   Complications/Tolerance None; patient tolerated the procedure well. Chest X-ray is ordered to confirm no post-procedural complication.   EBL Minimal   Specimen(s) None

## 2023-11-10 NOTE — Progress Notes (Signed)
 SLP Cancellation Note  Patient Details Name: Garrett [name] MRN: [id number] DOB: June 18, 1976   Cancelled treatment:       Reason Eval/Treat Not Completed: Medical issues which prohibited therapy- trach today. SLP will follow to determine appropriateness for intervention.  Jennetta Flood L. Samson Frederic, MA CCC/SLP Clinical Specialist - Acute Care SLP Acute Rehabilitation Services Office number [phone]    Blenda Mounts [name] 11/10/2023, 3:19 PM

## 2023-11-11 DIAGNOSIS — Z930 Tracheostomy status: Secondary | ICD-10-CM | POA: Diagnosis not present

## 2023-11-11 DIAGNOSIS — J9601 Acute respiratory failure with hypoxia: Secondary | ICD-10-CM | POA: Diagnosis not present

## 2023-11-11 DIAGNOSIS — I10 Essential (primary) hypertension: Secondary | ICD-10-CM | POA: Diagnosis not present

## 2023-11-11 DIAGNOSIS — E870 Hyperosmolality and hypernatremia: Secondary | ICD-10-CM

## 2023-11-11 DIAGNOSIS — I613 Nontraumatic intracerebral hemorrhage in brain stem: Secondary | ICD-10-CM | POA: Diagnosis not present

## 2023-11-11 DIAGNOSIS — I629 Nontraumatic intracranial hemorrhage, unspecified: Secondary | ICD-10-CM | POA: Diagnosis not present

## 2023-11-11 LAB — CBC
HCT: 28.7 % — ABNORMAL LOW (ref 39.0–52.0)
Hemoglobin: 8.8 g/dL — ABNORMAL LOW (ref 13.0–17.0)
MCH: 31.7 pg (ref 26.0–34.0)
MCHC: 30.7 g/dL (ref 30.0–36.0)
MCV: 103.2 fL — ABNORMAL HIGH (ref 80.0–100.0)
Platelets: 434 10*3/uL — ABNORMAL HIGH (ref 150–400)
RBC: 2.78 MIL/uL — ABNORMAL LOW (ref 4.22–5.81)
RDW: 14 % (ref 11.5–15.5)
WBC: 15.2 10*3/uL — ABNORMAL HIGH (ref 4.0–10.5)
nRBC: 0 % (ref 0.0–0.2)

## 2023-11-11 LAB — BASIC METABOLIC PANEL
Anion gap: 9 (ref 5–15)
BUN: 49 mg/dL — ABNORMAL HIGH (ref 6–20)
CO2: 24 mmol/L (ref 22–32)
Calcium: 8.5 mg/dL — ABNORMAL LOW (ref 8.9–10.3)
Chloride: 115 mmol/L — ABNORMAL HIGH (ref 98–111)
Creatinine, Ser: 1.25 mg/dL — ABNORMAL HIGH (ref 0.61–1.24)
GFR, Estimated: >60 mL/min (ref >60)
Glucose, Bld: 160 mg/dL — ABNORMAL HIGH (ref 70–99)
Potassium: 3.9 mmol/L (ref 3.5–5.1)
Sodium: 148 mmol/L — ABNORMAL HIGH (ref 135–145)

## 2023-11-11 LAB — PHOSPHORUS: Phosphorus: 3.8 mg/dL (ref 2.5–4.6)

## 2023-11-11 LAB — GLUCOSE, CAPILLARY
Glucose-Capillary: 137 mg/dL — ABNORMAL HIGH (ref 70–99)
Glucose-Capillary: 131 mg/dL — ABNORMAL HIGH (ref 70–99)
Glucose-Capillary: 161 mg/dL — ABNORMAL HIGH (ref 70–99)
Glucose-Capillary: 146 mg/dL — ABNORMAL HIGH (ref 70–99)
Glucose-Capillary: 149 mg/dL — ABNORMAL HIGH (ref 70–99)
Glucose-Capillary: 123 mg/dL — ABNORMAL HIGH (ref 70–99)

## 2023-11-11 MED ORDER — POLYETHYLENE GLYCOL 3350 17 G PO PACK
17.0000 g | PACK | Freq: Every day | ORAL | Status: DC
  Administered 2023-11-12 – 2023-11-14 (×2): 17 g
  Filled 2023-11-11 (×3): qty 1

## 2023-11-11 MED ORDER — LIDOCAINE-EPINEPHRINE (PF) 2 %-1:200000 IJ SOLN
INTRAMUSCULAR | Status: AC
  Administered 2023-11-11: 5 mL
  Filled 2023-11-11: qty 20

## 2023-11-11 MED ORDER — DOCUSATE SODIUM 50 MG/5ML PO LIQD
100.0000 mg | Freq: Two times a day (BID) | ORAL | Status: DC
  Administered 2023-11-13 – 2023-11-26 (×26): 100 mg
  Filled 2023-11-11 (×29): qty 10

## 2023-11-11 MED ORDER — MIDAZOLAM HCL 2 MG/2ML IJ SOLN: 2.0000 mg | INTRAMUSCULAR | Status: DC | PRN

## 2023-11-11 MED ORDER — MIDAZOLAM HCL 2 MG/2ML IJ SOLN
1.0000 mg | INTRAMUSCULAR | Status: DC | PRN
  Administered 2023-11-11 – 2023-11-25 (×5): 2 mg via INTRAVENOUS
  Filled 2023-11-11 (×5): qty 2

## 2023-11-11 MED ORDER — SODIUM CHLORIDE 0.9 % IV SOLN
3.0000 g | Freq: Four times a day (QID) | INTRAVENOUS | Status: DC
  Administered 2023-11-11 – 2023-11-12 (×4): 3 g via INTRAVENOUS
  Filled 2023-11-11 (×4): qty 8

## 2023-11-11 NOTE — Progress Notes (Signed)
 STROKE TEAM PROGRESS NOTE   INTERIM HISTORY/SUBJECTIVE  Girlfriend and his mother are at the bedside.  Patient remains intubated on no sedation Neurological exam remains unchanged.  He continues to have a disconjugate gaze and is not following commands and withdraws on bilateral lower extremities.  Vital signs stable. Likely will have   PEG tube placed tomorrow by trauma team. OBJECTIVE CBC    Component Value Date/Time   WBC 15.2 (H) 11/11/2023 0451   RBC 2.78 (L) 11/11/2023 0451   HGB 8.8 (L) 11/11/2023 0451   HCT 28.7 (L) 11/11/2023 0451   PLT 434 (H) 11/11/2023 0451   MCV 103.2 (H) 11/11/2023 0451   MCH 31.7 11/11/2023 0451   MCHC 30.7 11/11/2023 0451   RDW 14.0 11/11/2023 0451   LYMPHSABS 2.0 11/09/2023 0544   MONOABS 1.1 (H) 11/09/2023 0544   EOSABS 0.2 11/09/2023 0544   BASOSABS 0.0 11/09/2023 0544   BMET    Component Value Date/Time   NA 148 (H) 11/11/2023 0451   K 3.9 11/11/2023 0451   CL 115 (H) 11/11/2023 0451   CO2 24 11/11/2023 0451   GLUCOSE 160 (H) 11/11/2023 0451   BUN 49 (H) 11/11/2023 0451   CREATININE 1.25 (H) 11/11/2023 0451   CALCIUM 8.5 (L) 11/11/2023 0451   GFRNONAA >60 11/11/2023 0451   IMAGING past 24 hours No results found.     Vitals:   11/11/23 1400 11/11/23 1500 11/11/23 1549 11/11/23 1600  BP: (!) 144/99 128/81  130/80  Pulse: 82 81  83  Resp: 20 (!) 22  20  Temp:   99.9 F (37.7 C)   TempSrc:   Axillary   SpO2: 97% 99%  96%  Weight:      Height:       PHYSICAL EXAM General: Intubated patient in no acute distress CV: Regular rate and rhythm on monitor Respiratory: Mechanically ventilated  NEURO (off of sedation since 3/14):  Mental Status:Obtunded, does not open eyes to voice or noxious stimuli, does not follow commands.  Cranial Nerves:  II: Pupils pinpoint, nonreactive III, IV, VI: Eyes a with slightly upward gaze, disconjugate gaze he does not blink to threat bilaterally. V: Weak corneal on right, absent on left VII:  Face appears symmetrical resting with ET tube present IX, X: Cough and gag intact XI: Head is midline XII: ETT in place  Motor/Sensory: RUE: Abnormal extensor posturing LUE: Abnormal extensor posturing BLE  withdrawal to noxious stimuli today  Coordination: Deferred Gait: Deferred  ASSESSMENT/PLAN  [name] is a 48 y.o. male with history of HTN admitted for ICH.    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 Basal cisterns and fourth ventricle are effaced without hydrocephalus at this time.  CTA head & neck no LVO CT repeat 3/9 no substantial change  Repeat Ct head 3/18 Interval decrease in size of subacute pontine hemorrhage, Decreased regional mass effect, Interval decrease in scattered small volume subarachnoid and intraventricular hemorrhage. 2D Echo EF 60 to 65%, moderate concentric LVH, grade 1 diastolic dysfunction, normal left atrial size, no atrial level shunt LDL 96 HgbA1c 4.7 UDS positive for opiates VTE prophylaxis - continue SQ lovenox No antithrombotic prior to admission, continue No antithrombotic secondary to ICH Therapy recommendations:  Pending Disposition:  pending trach   Brainstem compression Continued poor neurological  exam 3% hypertonic off Continue FW 200 Q4 Na 154> 159 >157 >158->151->149->147->144->146  Hypertension Home meds:  None Stable Continue Norvasc, hydralazine, losartan, Lopressor Clonidine D/Cd in case of possible side effect of reduced sympathetic CNS nerve responses affecting neurological exam.  PRN labetalol  BP goal less than 160 Long-term BP goal normotensive  Hypoxic respiratory failure Aspiration PNA Intubated in the ED PCCM following, appreciate assitance SVT/VAP per CCM CXR Streaky  left basilar opacity  Tmax 101.5->100.4->102.1->100.9->100.6->100.1 WBC 20.4-> 9.8 -> 13.2-> 18.1-14.1--15.5->18.3->13.2-13.3 Unasyn completed Family would like to wait full 14 days before placing trach -> likely tomorrow or early next week  Dysphagia Coretrak in place right nare Continue tube feeding and free water 200 cc Q4h  Substance use UDS positive for opioids Narcan given in ED  Other Active Problems Hypokalemia: K 2.6 -> 4.2-> 3.3-> 3.4->4.1-> 3.5-> 3.8 -> 4.1->3.7 AKI, Cr 2.2-> 1.54 > 1.38> 1.37-1.71-1.56->1.16->1.75->1.26 Hypermagnesia: 3.0->2.9->3.0->3.2 Hyperphosphatemia: 4.9-4.8-3.6  Hospital day # 15    Patient neurological exam remains quite poor but family has decided to continue life support and aggressive medical care.  He is now status post tracheostomy and plan is to have PEG tube tomorrow by trauma team.  Discussed with Dr. Francine Graven critical care medicine.  Discussed with Dr. Janee Morn, team for PEG.  Discussed with mother and girlfriend at the bedside.    This patient is critically ill and at significant risk of neurological worsening, death and care requires constant monitoring of vital signs, hemodynamics,respiratory and cardiac monitoring, extensive review of multiple databases, frequent neurological assessment, discussion with family, other specialists and medical decision making of high complexity.I have made any additions or clarifications directly to the above note.This critical care time does not reflect procedure time, or teaching time or supervisory time of PA/NP/Med Resident etc but could involve care discussion time.  I spent 30 minutes of neurocritical care time  in the care of  this patient.       Delia Heady, MD Medical Director [facility] Pager: [phone] 11/11/2023 5:19 PM

## 2023-11-11 NOTE — Progress Notes (Addendum)
 Patient with planned PEG tube placement at bedside tomorrow morning. Telephone consent obtained by Dr. Janee Morn with patient's mother, Sair Faulcon, and witnessed by this RN. Form placed in shadow chart.

## 2023-11-11 NOTE — Plan of Care (Signed)
  Problem: Nutrition: Goal: Risk of aspiration will decrease Outcome: Progressing   Problem: Elimination: Goal: Will not experience complications related to bowel motility Outcome: Progressing   Problem: Pain Managment: Goal: General experience of comfort will improve and/or be controlled Outcome: Progressing

## 2023-11-11 NOTE — Progress Notes (Signed)
 eLink Physician-Brief Progress Note Patient Name: Garrett [name] DOB: 18-Feb-1976 MRN: [id number]   Date of Service  11/11/2023  HPI/Events of Note   48yo M with PMHx of HTN was admitted with a large acute pontine hemorrhage. He remains on the ventilator.  Critical care medicine placed a new trach today.  Every time he coughs, large amount of blood comes out from around the tracheostomy site.  He has saturated a whole towel.  Limited camera evaluation as the patient has been cleaned up.  Adequately sedated.  eICU Interventions  Will request ground team evaluation to see whether the patient may need additional intervention.     Intervention Category Intermediate Interventions: Respiratory distress - evaluation and management;Bleeding - evaluation and treatment with blood products  Kahliyah Dick 11/11/2023, 2:03 AM

## 2023-11-11 NOTE — Progress Notes (Signed)
 Pharmacy Antibiotic Note  Garrett [name] is a 48 y.o. male admitted on 10/27/2023 with pneumonia.  Pharmacy has been consulted for unasyn dosing.  Plan: Unasyn 3 gm iv q6h  Height: 5' 7.99" (172.7 cm) Weight: 107.4 kg (236 lb 12.4 oz) IBW/kg (Calculated) : 68.38  Temp (24hrs), Avg:100.1 F (37.8 C), Min:98.8 F (37.1 C), Max:102.1 F (38.9 C)  Recent Labs  Lab 11/06/23 0631 11/07/23 0509 11/08/23 0430 11/09/23 0544 11/10/23 1138 11/11/23 0451  WBC 15.5* 18.3* 13.2* 13.3*  --  15.2*  CREATININE 1.56* 1.16 1.75* 1.26* 1.20 1.25*    Estimated Creatinine Clearance: 86.8 mL/min (A) (by C-G formula based on SCr of 1.25 mg/dL (H)).    Allergies  Allergen Reactions   Tomato Hives, Itching and Rash      Thank you for allowing pharmacy to be a part of this patient's care.  Greta Doom BS, PharmD, BCPS Clinical Pharmacist 11/11/2023 12:58 PM  Contact: [phone] after 3 PM  "Be curious, not judgmental..." -Debbora Dus

## 2023-11-11 NOTE — Progress Notes (Signed)
 Patient ID: Garrett [name], male   DOB: Mar 27, 1976, 48 y.o.   MRN: [id number] Plan PEG at the bedside tomorrow. I called his mother and discussed the procedure, risks, and benefits. She agrees and we documented phone consent. Will hold TF at MN. I also spoke with his fiancee and her son in the room.  Violeta Gelinas, MD, MPH, FACS Please use [email or web address] to contact on call provider

## 2023-11-11 NOTE — Progress Notes (Signed)
 NAME:  Garrett [name], MRN:  [id number], DOB:  Mar 26, 1976, LOS: 15 ADMISSION DATE:  10/27/2023, CONSULTATION DATE:  10/27/2023 REFERRING MD:  Coralee Pesa, DO CHIEF COMPLAINT:  Found Down  History of Present Illness:   48 y/o male with h/o HTN who was found down for unknown downtime by his [place] when she came home from work, he was having agonal breathing.  He apparently had driven her to work this morning.  He has HTN but never checks his BP and does not follow with MD.  She says you can tell when his BP is really high; his eyes get blood shot and he is sweating.  No h/o seizures.  Besides almost daily [name] and cigarette smoking, she is not aware of any other drugs. Drug screen positive for opioids and he was given several doses of Narcan. Patient found to have:  1. 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 Basal cisterns and fourth ventricle are effaced without hydrocephalus at this time. 2. Trace additional subarachnoid hemorrhage along the left parietal convexity. BP in ED 262/156. His cxr revealed a RUL and perihilar infiltrates suggesting aspiration pneumonia. ED labs also revealed PH 7.169, LA 4.4, CPK 985. Patient was intubated in ED.  Pertinent  Medical History  HTN  Significant Hospital Events: Including procedures, antibiotic start and stop dates in addition to other pertinent events   3/8: Intubated in ED, pontine bleed/SAH. CT head 17:09 Acute large hemorrhage 4.1 cm in pons with intraventricular extension without hydrocephalus 3/9: CTA 03:14 AM No change in IPH in pons, similar biparietal [facility] 3/14: Now awake, appears locked in can follow commands with eyes. 3/16 Purposeful with left upper extremity  3/22 tracheostomy placed at bedside, bleeding issues overnight from trach site  Interim History / Subjective:   Foley replaced due to on going urinary  retention Updated mother and and significant other at bedside Febrile yesterday  Objective   Blood pressure 132/86, pulse 84, temperature (!) 101 F (38.3 C), temperature source Oral, resp. rate 18, height 5' 7.99" (1.727 m), weight 107.4 kg, SpO2 100%.    Vent Mode: PRVC FiO2 (%):  [40 %] 40 % Set Rate:  [18 bmp] 18 bmp Vt Set:  [550 mL] 550 mL PEEP:  [5 cmH20] 5 cmH20 Plateau Pressure:  [16 cmH20-21 cmH20] 21 cmH20   Intake/Output Summary (Last 24 hours) at 11/11/2023 1253 Last data filed at 11/11/2023 1229 Gross per 24 hour  Intake 2992.49 ml  Output 2375 ml  Net 617.49 ml   Filed Weights   11/09/23 0500 11/10/23 0500 11/11/23 0500  Weight: 107.1 kg 107.4 kg 107.4 kg   Physical Exam: Gen:      No acute distress, obese. HEENT:  moist mucous membranes, sclera anicteric, poor dentition Neck:     tracheostomy in place, dried blood around, small bore feeding tube in place Lungs:    Bilateral ventilated breath sounds, no accessory muscle use, on pressure support weaning CV:        S1-S2 regular Abd:      Abdomen soft.,  Bowel sounds present Ext:    No edema Skin:      Warm and dry; no rash Neuro: PERRL, not following commands  Ancillary Tests Personally Reviewed:   CT head on 3/9 shows extensive hemorrhage involving better part of the brainstem.  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  -Maintain minimal sedation - Target SBP 130-150.   Acute hypoxic respiratory failure due to aspiration pneumonia.  - tracheostomy placed 3/22, monitor for bleeding - weaning trials as tolerated - follow up BAL culture, gram + cocci in pairs - start unasyn  Hypertension -Continue metoprolol, hydralazine, amlodipine   AKI  Hypernatremia -continue free water  Anemia - monitor - transfuse for hemoglobin 7g/dL or lower  Poor Dentition - a couple upper right teeth and one left lower tooth have chipped off - Will  need dental evaluation at some point  Summary -His neurological improvement has plateaued. recovery is likely to be partial but given patient's young age, it may be worth giving him more time to see if a reasonable functional recovery is possible.  Proceeding with trachoestomy today, consult to [facility] placed for LTACH evaluation  Best Practice (right click and "Reselect all SmartList Selections" daily)   Diet/type: tubefeeds DVT prophylaxis LMWH Pressure ulcer(s): N/A GI prophylaxis: H2B Lines: N/A Foley:  external catheter.  Code Status:  DNR with pre-arrest interventions    CRITICAL CARE Performed by: Martina Sinner   Total critical care time: 35 minutes  Critical care time was exclusive of separately billable procedures and treating other patients.  Critical care was necessary to treat or prevent imminent or life-threatening deterioration.  Critical care was time spent personally by me on the following activities: development of treatment plan with patient and/or surrogate as well as nursing, discussions with consultants, evaluation of patient's response to treatment, examination of patient, obtaining history from patient or surrogate, ordering and performing treatments and interventions, ordering and review of laboratory studies, ordering and review of radiographic studies, pulse oximetry, re-evaluation of patient's condition and participation in multidisciplinary rounds.  Melody Comas, MD [facility] Office: [phone]   See Amion for personal pager PCCM on call pager [phone] until 7pm. Please call Elink 7p-7a. [phone]

## 2023-11-11 NOTE — Progress Notes (Signed)
 PCCM called to bedside to evaluate for tracheal bleeding. Patient trached yesterday. On vent hemodynamically stable. Per nurse patient started bleeding around trach when suctioned soaking up half a towel. On arrival significant bleeding appreciated at bottom of trach site. Pressure held for several minutes and bleeding resolved. Dressing applied. Will sedate as needed to reduce cough/gag to prevent re-bleeding.  JD Anselm Lis [facility] 11/11/2023, 2:35 AM  Please see [email or web address] for pager details.  From 7A-7P if no response, please call [phone]. After hours, please call ELink [phone].

## 2023-11-11 NOTE — Progress Notes (Signed)
 PT Cancellation Note  Patient Details Name: Garrett [name] MRN: [id number] DOB: Aug 02, 1976   Cancelled Treatment:    Reason Eval/Treat Not Completed: PT screened, no needs identified, will sign off - remains intubated, sedated, with bleeding from trach site overnight. PT to hold, please reconsult when medically appropriate.   Marye Round, PT DPT Acute Rehabilitation Services Secure Chat Preferred  Office [phone]    Neyda Durango Sheliah Plane 11/11/2023, 9:05 AM

## 2023-11-12 DIAGNOSIS — E870 Hyperosmolality and hypernatremia: Secondary | ICD-10-CM | POA: Diagnosis not present

## 2023-11-12 DIAGNOSIS — R1311 Dysphagia, oral phase: Secondary | ICD-10-CM | POA: Diagnosis not present

## 2023-11-12 DIAGNOSIS — I613 Nontraumatic intracerebral hemorrhage in brain stem: Secondary | ICD-10-CM | POA: Diagnosis not present

## 2023-11-12 DIAGNOSIS — Z930 Tracheostomy status: Secondary | ICD-10-CM | POA: Diagnosis not present

## 2023-11-12 DIAGNOSIS — I69891 Dysphagia following other cerebrovascular disease: Secondary | ICD-10-CM | POA: Diagnosis not present

## 2023-11-12 DIAGNOSIS — J9601 Acute respiratory failure with hypoxia: Secondary | ICD-10-CM | POA: Diagnosis not present

## 2023-11-12 LAB — GLUCOSE, CAPILLARY
Glucose-Capillary: 98 mg/dL (ref 70–99)
Glucose-Capillary: 125 mg/dL — ABNORMAL HIGH (ref 70–99)
Glucose-Capillary: 120 mg/dL — ABNORMAL HIGH (ref 70–99)
Glucose-Capillary: 130 mg/dL — ABNORMAL HIGH (ref 70–99)
Glucose-Capillary: 116 mg/dL — ABNORMAL HIGH (ref 70–99)
Glucose-Capillary: 121 mg/dL — ABNORMAL HIGH (ref 70–99)

## 2023-11-12 LAB — CBC
HCT: 26.4 % — ABNORMAL LOW (ref 39.0–52.0)
Hemoglobin: 8 g/dL — ABNORMAL LOW (ref 13.0–17.0)
MCH: 31.5 pg (ref 26.0–34.0)
MCHC: 30.3 g/dL (ref 30.0–36.0)
MCV: 103.9 fL — ABNORMAL HIGH (ref 80.0–100.0)
Platelets: 480 10*3/uL — ABNORMAL HIGH (ref 150–400)
RBC: 2.54 MIL/uL — ABNORMAL LOW (ref 4.22–5.81)
RDW: 14.2 % (ref 11.5–15.5)
WBC: 19.9 10*3/uL — ABNORMAL HIGH (ref 4.0–10.5)
nRBC: 0 % (ref 0.0–0.2)

## 2023-11-12 LAB — BASIC METABOLIC PANEL
Anion gap: 8 (ref 5–15)
BUN: 42 mg/dL — ABNORMAL HIGH (ref 6–20)
CO2: 25 mmol/L (ref 22–32)
Calcium: 8.5 mg/dL — ABNORMAL LOW (ref 8.9–10.3)
Chloride: 113 mmol/L — ABNORMAL HIGH (ref 98–111)
Creatinine, Ser: 1.28 mg/dL — ABNORMAL HIGH (ref 0.61–1.24)
GFR, Estimated: >60 mL/min (ref >60)
Glucose, Bld: 140 mg/dL — ABNORMAL HIGH (ref 70–99)
Potassium: 3.7 mmol/L (ref 3.5–5.1)
Sodium: 146 mmol/L — ABNORMAL HIGH (ref 135–145)

## 2023-11-12 LAB — MRSA NEXT GEN BY PCR, NASAL: MRSA by PCR Next Gen: DETECTED — AB

## 2023-11-12 MED ORDER — VECURONIUM BROMIDE 10 MG IV SOLR
10.0000 mg | Freq: Once | INTRAVENOUS | Status: AC
  Administered 2023-11-12: 10 mg via INTRAVENOUS
  Filled 2023-11-12: qty 10

## 2023-11-12 MED ORDER — ENOXAPARIN SODIUM 60 MG/0.6ML IJ SOSY
50.0000 mg | PREFILLED_SYRINGE | Freq: Every day | INTRAMUSCULAR | Status: DC
  Administered 2023-11-13 – 2024-01-16 (×65): 50 mg via SUBCUTANEOUS
  Filled 2023-11-12 (×66): qty 0.6

## 2023-11-12 MED ORDER — VANCOMYCIN HCL 750 MG/150ML IV SOLN
750.0000 mg | Freq: Two times a day (BID) | INTRAVENOUS | Status: DC
  Administered 2023-11-12 – 2023-11-15 (×6): 750 mg via INTRAVENOUS
  Filled 2023-11-12 (×7): qty 150

## 2023-11-12 MED ORDER — VANCOMYCIN HCL 2000 MG/400ML IV SOLN
2000.0000 mg | Freq: Once | INTRAVENOUS | Status: AC
  Administered 2023-11-12: 2000 mg via INTRAVENOUS
  Filled 2023-11-12: qty 400

## 2023-11-12 MED ORDER — LACTATED RINGERS IV SOLN: INTRAVENOUS | Status: DC

## 2023-11-12 MED ORDER — MIDAZOLAM HCL 2 MG/2ML IJ SOLN
2.0000 mg | Freq: Once | INTRAMUSCULAR | Status: AC
  Administered 2023-11-12: 2 mg via INTRAVENOUS
  Filled 2023-11-12: qty 2

## 2023-11-12 MED ORDER — STERILE WATER FOR INJECTION IJ SOLN
INTRAMUSCULAR | Status: AC
  Filled 2023-11-12: qty 10

## 2023-11-12 MED ORDER — FENTANYL CITRATE PF 50 MCG/ML IJ SOSY
100.0000 ug | PREFILLED_SYRINGE | Freq: Once | INTRAMUSCULAR | Status: AC
  Administered 2023-11-12: 50 ug via INTRAVENOUS
  Filled 2023-11-12: qty 2

## 2023-11-12 NOTE — Progress Notes (Signed)
 NAME:  Garrett [name], MRN:  [id number], DOB:  [birth year], LOS: 16 ADMISSION DATE:  10/27/2023, CONSULTATION DATE:  10/27/2023 REFERRING MD:  Coralee Pesa, DO CHIEF COMPLAINT:  Found Down  History of Present Illness:   48 y/o male with h/o HTN who was found down for unknown downtime by his [place] when she came home from work, he was having agonal breathing.  He apparently had driven her to work this morning.  He has HTN but never checks his BP and does not follow with MD.  She says you can tell when his BP is really high; his eyes get blood shot and he is sweating.  No h/o seizures.  Besides almost daily [name] and cigarette smoking, she is not aware of any other drugs. Drug screen positive for opioids and he was given several doses of Narcan. Patient found to have:  1. 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 Basal cisterns and fourth ventricle are effaced without hydrocephalus at this time. 2. Trace additional subarachnoid hemorrhage along the left parietal convexity. BP in ED 262/156. His cxr revealed a RUL and perihilar infiltrates suggesting aspiration pneumonia. ED labs also revealed PH 7.169, LA 4.4, CPK 985. Patient was intubated in ED.  Pertinent  Medical History  HTN  Significant Hospital Events: Including procedures, antibiotic start and stop dates in addition to other pertinent events   3/8: Intubated in ED, pontine bleed/SAH. CT head 17:09 Acute large hemorrhage 4.1 cm in pons with intraventricular extension without hydrocephalus 3/9: CTA 03:14 AM No change in IPH in pons, similar biparietal [facility] 3/14: Now awake, appears locked in can follow commands with eyes. 3/16 Purposeful with left upper extremity  3/22 tracheostomy placed at bedside, bleeding issues overnight from trach site  Interim History / Subjective:  PEG today at 0900. Sputum growing  S.aureus. MRSA nares were positive 3/9.  Objective   Blood pressure 115/65, pulse 79, temperature 98.7 F (37.1 C), temperature source Axillary, resp. rate 18, height 5' 7.99" (1.727 m), weight 102.7 kg, SpO2 100%.    Vent Mode: PSV;CPAP FiO2 (%):  [40 %] 40 % Set Rate:  [18 bmp] 18 bmp Vt Set:  [550 mL] 550 mL PEEP:  [5 cmH20] 5 cmH20 Pressure Support:  [10 cmH20] 10 cmH20 Plateau Pressure:  [19 cmH20-21 cmH20] 19 cmH20   Intake/Output Summary (Last 24 hours) at 11/12/2023 7829 Last data filed at 11/12/2023 0800 Gross per 24 hour  Intake 2872.47 ml  Output 2110 ml  Net 762.47 ml   Filed Weights   11/10/23 0500 11/11/23 0500 11/12/23 0500  Weight: 107.4 kg 107.4 kg 102.7 kg   Physical Exam: General: Adult male, critically ill but in NAD. Neuro: Somnolent, opens eyes and tracks, does not follow commands. HEENT: [place]/AT. Sclerae anicteric. Trach C/D/I. Cardiovascular: RRR, no M/R/G.  Lungs: Respirations even and unlabored.  CTA bilaterally, No W/R/R. Abdomen: BS x 4, soft, NT/ND.  Musculoskeletal: No gross deformities, no edema.  Skin: Intact, warm, no rashes.  Ancillary Tests Personally Reviewed:   CT head on 3/9 shows extensive hemorrhage involving better part of the brainstem.  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  - Per stroke service - Maintain minimal sedation - Target SBP 130-150.   Acute hypoxic respiratory failure due to aspiration pneumonia - s/p tracheostomy placement 3/22 S.Aureus PNA - sputum culture from  3/22 noted S.aureus (MRSA nares + from 10/28/23) - Continue routine trach care - SBT/WUA as able - Change abxr from Unasyn to Vanc given + MRSA nares from 3/9 - Likely needs LTAC  Hypertension - Continue metoprolol, hydralazine, amlodipine   AKI  Hypernatremia - Gentle fluids - Continue free water - Follow BMP  Anemia - monitor - transfuse for hemoglobin 7g/dL or  lower  Poor Dentition -  A ouple upper right teeth and one left lower tooth have chipped off - Will need dental evaluation at some point  Summary -His neurological improvement has plateaued. recovery is likely to be partial but given patient's young age, it may be worth giving him more time to see if a reasonable functional recovery is possible.   Best Practice (right click and "Reselect all SmartList Selections" daily)   Diet/type: tubefeeds DVT prophylaxis LMWH Pressure ulcer(s): N/A GI prophylaxis: H2B Lines: N/A Foley:  external catheter.  Code Status:  DNR with pre-arrest interventions   CC time: 30 min.   Rutherford Guys, PA - [facility] [place] [facility] For pager details, please see AMION or use Epic chat  After 1900, please call [facility] for cross coverage needs 11/12/2023, 8:39 AM

## 2023-11-12 NOTE — Op Note (Signed)
*   No surgery found *  9:56 AM  PATIENT:  Garrett [name]  48 y.o. male  PRE-OPERATIVE DIAGNOSIS:  CVA, need for enteral access  POST-OPERATIVE DIAGNOSIS:  CVA, need for enteral access  PROCEDURE: Percutaneous endoscopic gastrostomy tube placement  SURGEON: Violeta Gelinas, MD  ASSISTANTS: Leary Roca, PA-C  ANESTHESIA:   IV sedation  EBL:  Total I/O In: 104.9 [I.V.:5; IV Piggyback:99.9] Out: 0   BLOOD ADMINISTERED:none  DRAINS: none   SPECIMEN:  No Specimen  DISPOSITION OF SPECIMEN:  N/A  COUNTS:  YES  DICTATION: .Dragon Dictation Procedure in detail: Informed consent was obtained.  He was monitored and on full ventilator support in the trauma neuro ICU.  We did a timeout procedure.  He received IV sedation.  A bite-block was placed.  The endoscope was advanced through his mouth down into his esophagus.  There was some bloody swallow secretions but no other abnormalities noted.  The scope was advanced into his stomach.  The stomach was insufflated.  His abdomen was prepped and draped in a sterile fashion.  We found an excellent poke site.  Local was injected and a small incision was made.  The Angiocath was inserted through the skin into the stomach under direct vision.  Next we placed a wire through the Angiocath and this was grasped with the endoscopic snare.  It was pulled out through his mouth.  The PEG tube was attached to the wire and the PEG tube was pulled out through his abdominal wall to an appropriate length.  The scope was reinserted down into his stomach and confirmed placement of the PEG tube.  We removed his core track without difficulty.  The PEG tube flange was applied and adjusted to appropriate positioning.  6.5 at the skin.  There was no significant bleeding.  The stomach was desufflated.  Antibiotic ointment was placed and the PEG tube was capped.  He tolerated the procedure without apparent complication.  All counts were correct.  He remained monitored in  the intensive care unit.  We will place an abdominal binder.  Okay for medications now and tube feeds in 4 hours. PATIENT DISPOSITION:  ICU - intubated and hemodynamically stable.   Delay start of Pharmacological VTE agent (>24hrs) due to surgical blood loss or risk of bleeding:  no  Violeta Gelinas, MD, MPH, FACS Pager: [phone]  3/24/20259:56 AM

## 2023-11-12 NOTE — Progress Notes (Signed)
 Pharmacy Antibiotic Note  Garrett [name] is a 48 y.o. male admitted on 10/27/2023 with pneumonia.  Pharmacy has been consulted for vancomycin dosing.  WBC 13.3 up, stable, Scr 1.2 improved, Afeb Resp Cx (BAL) now with Staph aureus, previously positive MRSA PCR (3/9)  Plan: Vancomycin 2000mg  IV x1 loading dose Vancomycin 750mg  IV every 12 hours (eAUC 476, IBW, Vd 0.5, Scr 1.2, eCmin 14.3) Goal AUC 400-600 Follow renal function, culture susceptibilities, CBC, fever curve and signs of clinical improvement   Height: 5' 7.99" (172.7 cm) Weight: 102.7 kg (226 lb 6.6 oz) IBW/kg (Calculated) : 68.38  Temp (24hrs), Avg:99.9 F (37.7 C), Min:98.1 F (36.7 C), Max:101 F (38.3 C)  Recent Labs  Lab 11/07/23 0509 11/08/23 0430 11/09/23 0544 11/10/23 1138 11/11/23 0451 11/12/23 0641  WBC 18.3* 13.2* 13.3*  --  15.2* 19.9*  CREATININE 1.16 1.75* 1.26* 1.20 1.25* 1.28*    Estimated Creatinine Clearance: 82.8 mL/min (A) (by C-G formula based on SCr of 1.28 mg/dL (H)).    Allergies  Allergen Reactions   Tomato Hives, Itching and Rash    Antimicrobials this admission: Cefepime 3/8 x1 Unasyn 3/9 >> 3/14; 3/23 >>3/24 Vancomycin 3/8 x1, 3/24>>   Microbiology results: 3/8 MRSA PCR - positive 3/8 BCx - neg 3/19 UA/CXR negative   Thank you for allowing pharmacy to be a part of this patient's care.  Stephenie Acres, PharmD PGY1 Pharmacy Resident 11/12/2023 8:38 AM

## 2023-11-12 NOTE — Progress Notes (Signed)
 Patient ID: Garrett [name], male   DOB: 1976-01-28, 48 y.o.   MRN: [id number]      Subjective: Weaning on vent ROS negative except as listed above. Objective: Vital signs in last 24 hours: Temp:  [98.1 F (36.7 C)-101 F (38.3 C)] 98.7 F (37.1 C) (03/24 0800) Pulse Rate:  [76-115] 79 (03/24 0800) Resp:  [15-30] 18 (03/24 0800) BP: (107-160)/(56-99) 115/65 (03/24 0800) SpO2:  [95 %-100 %] 100 % (03/24 0819) FiO2 (%):  [40 %] 40 % (03/24 0819) Weight:  [102.7 kg] 102.7 kg (03/24 0500) Last BM Date : 11/11/23  Intake/Output from previous day: 03/23 0701 - 03/24 0700 In: 2850.1 [NG/GT:2550; IV Piggyback:300.1] Out: 2385 [Urine:2385] Intake/Output this shift: Total I/O In: 22.4 [IV Piggyback:22.4] Out: 0   General appearance: no distress Neck: trach, on vent wean GI: soft, NT  Lab Results: CBC  Recent Labs    11/11/23 0451 11/12/23 0641  WBC 15.2* 19.9*  HGB 8.8* 8.0*  HCT 28.7* 26.4*  PLT 434* 480*   BMET Recent Labs    11/11/23 0451 11/12/23 0641  NA 148* 146*  K 3.9 3.7  CL 115* 113*  CO2 24 25  GLUCOSE 160* 140*  BUN 49* 42*  CREATININE 1.25* 1.28*  CALCIUM 8.5* 8.5*   PT/INR No results for input(s): "LABPROT", "INR" in the last 72 hours. ABG No results for input(s): "PHART", "HCO3" in the last 72 hours.  Invalid input(s): "PCO2", "PO2"  Studies/Results: DG Chest Port 1 View Result Date: 11/10/2023 CLINICAL DATA:  Tracheostomy. EXAM: PORTABLE CHEST 1 VIEW COMPARISON:  11/07/2023. FINDINGS: Tracheostomy is midline. The tubing may be slightly kinked proximally. Heart size stable and accentuated by low lung volumes and AP semi upright technique. Minimal streaky atelectasis in the left lung base. No airspace consolidation or pleural fluid. Feeding tube terminates in the stomach. IMPRESSION: 1. Interval tracheostomy placement. Tubing may be kinked proximally. Please correlate clinically. 2. Low lung volumes.  No acute findings. Electronically Signed    By: Leanna Battles M.D.   On: 11/10/2023 15:20    Anti-infectives: Anti-infectives (From admission, onward)    Start     Dose/Rate Route Frequency Ordered Stop   11/11/23 1345  Ampicillin-Sulbactam (UNASYN) 3 g in sodium chloride 0.9 % 100 mL IVPB        3 g 200 mL/hr over 30 Minutes Intravenous Every 6 hours 11/11/23 1257     10/28/23 0400  Ampicillin-Sulbactam (UNASYN) 3 g in sodium chloride 0.9 % 100 mL IVPB  Status:  Discontinued        3 g 200 mL/hr over 30 Minutes Intravenous Every 6 hours 10/27/23 2151 11/02/23 0945   10/27/23 1845  vancomycin (VANCOCIN) IVPB 1000 mg/200 mL premix  Status:  Discontinued        1,000 mg 200 mL/hr over 60 Minutes Intravenous  Once 10/27/23 1843 10/27/23 1843   10/27/23 1845  ceFEPIme (MAXIPIME) 2 g in sodium chloride 0.9 % 100 mL IVPB        2 g 200 mL/hr over 30 Minutes Intravenous  Once 10/27/23 1843 10/27/23 2024   10/27/23 1845  vancomycin (VANCOREADY) IVPB 2000 mg/400 mL        2,000 mg 200 mL/hr over 120 Minutes Intravenous  Once 10/27/23 1843 10/27/23 2123       Assessment/Plan:  Acute pontine hemorrhage on ventilator with need for enteral  - bedside PEG today. Procedure, risks, and benefits D/W his mother yesterday and consent documented.  LOS: 16 days  Violeta Gelinas, MD, MPH, FACS Trauma & General Surgery Use [email or web address] to contact on call provider  11/12/2023

## 2023-11-12 NOTE — Progress Notes (Addendum)
 STROKE TEAM PROGRESS NOTE   INTERIM HISTORY/SUBJECTIVE No family at the bedside.  Patient had PEG tube placed today by trauma team and received paralytics Hence neurological exam is limited due to sedation and paralytics Labs this morning WBC 19.9, Hgb 8, platelets 480, sodium 146, chloride 113, BUN 42, creatinine 1.28, Tmax 101   OBJECTIVE CBC    Component Value Date/Time   WBC 19.9 (H) 11/12/2023 0641   RBC 2.54 (L) 11/12/2023 0641   HGB 8.0 (L) 11/12/2023 0641   HCT 26.4 (L) 11/12/2023 0641   PLT 480 (H) 11/12/2023 0641   MCV 103.9 (H) 11/12/2023 0641   MCH 31.5 11/12/2023 0641   MCHC 30.3 11/12/2023 0641   RDW 14.2 11/12/2023 0641   LYMPHSABS 2.0 11/09/2023 0544   MONOABS 1.1 (H) 11/09/2023 0544   EOSABS 0.2 11/09/2023 0544   BASOSABS 0.0 11/09/2023 0544   BMET    Component Value Date/Time   NA 146 (H) 11/12/2023 0641   K 3.7 11/12/2023 0641   CL 113 (H) 11/12/2023 0641   CO2 25 11/12/2023 0641   GLUCOSE 140 (H) 11/12/2023 0641   BUN 42 (H) 11/12/2023 0641   CREATININE 1.28 (H) 11/12/2023 0641   CALCIUM 8.5 (L) 11/12/2023 0641   GFRNONAA >60 11/12/2023 0641   IMAGING past 24 hours No results found.     Vitals:   11/12/23 1000 11/12/23 1100 11/12/23 1135 11/12/23 1200  BP: 131/78 133/79  126/79  Pulse: 96 89  82  Resp: 19 20  18   Temp:      TempSrc:      SpO2: 96% 95% 100% 96%  Weight:      Height:       PHYSICAL EXAM General: Intubated patient in no acute distress CV: Regular rate and rhythm on monitor Respiratory: Mechanically ventilated  NEURO (off of sedation since 3/14): received paralytic 3/24 for PEG placement Mental Status:Obtunded, does not open eyes to voice or noxious stimuli, does not follow commands.  Cranial Nerves:  II: Pupils pinpoint, nonreactive III, IV, VI: Eyes a with slightly upward gaze, disconjugate gaze he does not blink to threat bilaterally. V: Absent bilaterally VII: Face appears symmetrical resting with ET tube  present IX, X: Cough and gag intact XI: Head is midline XII: ETT in place  Motor/Sensory: RUE: No response LUE: No response to noxious stimuli BLE no response to noxious stimuli  Coordination: Deferred Gait: Deferred  ASSESSMENT/PLAN  [name] is a 48 y.o. male with history of HTN admitted for ICH.    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 Basal cisterns and fourth ventricle are effaced without hydrocephalus at this time.  CTA head & neck no LVO CT repeat 3/9 no substantial change  Repeat Ct head 3/18 Interval decrease in size of subacute pontine hemorrhage, Decreased regional mass effect, Interval decrease in scattered small volume subarachnoid and intraventricular hemorrhage. 2D Echo EF 60 to 65%, moderate concentric LVH, grade 1 diastolic dysfunction, normal left atrial size, no atrial level shunt LDL 96 HgbA1c 4.7 UDS positive for opiates VTE prophylaxis - continue SQ lovenox No antithrombotic prior to admission, continue No antithrombotic secondary to ICH Therapy recommendations:  Pending Disposition:  pending trach   Brainstem compression Continued poor neurological exam 3% hypertonic off Continue FW 200 Q4 Na 154> 159 >  157 >098->119->147->829->562->130-865  Hypertension Home meds:  None Stable Continue Norvasc, hydralazine, losartan, Lopressor Clonidine D/Cd in case of possible side effect of reduced sympathetic CNS nerve responses affecting neurological exam.  PRN labetalol  BP goal less than 160 Long-term BP goal normotensive  Hypoxic respiratory failure Aspiration PNA Intubated in the ED PCCM following, appreciate assitance SVT/VAP per CCM CXR Streaky left basilar opacity  Tmax  101.5->100.4->102.1->100.9->100.6->100.1->101 WBC 20.4-> 9.8 -> 13.2-> 18.1-14.1--15.5->18.3->13.2-13.3-19.9 Unasyn completed Trach placed 3/22  Dysphagia PEG tube placed 3/24 Continue tube feeding and free water 200 cc Q4h  Substance use UDS positive for opioids Narcan given in ED  Other Active Problems Hypokalemia: K 2.6 -> 4.2-> 3.3-> 3.4->4.1-> 3.5-> 3.8 -> 4.1->3.7 AKI, Cr 2.2-> 1.54 > 1.38> 1.37-1.71-1.56->1.16->1.75->1.26->1.28 Hypermagnesia: 3.0->2.9->3.0->3.2 Hyperphosphatemia: 4.9-4.8-3.6  Hospital day # 16  Gevena Mart DNP, ACNPC-AG  [facility]  I have personally obtained history,examined this patient, reviewed notes, independently viewed imaging studies, participated in medical decision making and plan of care.ROS completed by me personally and pertinent positives fully documented  I have made any additions or clarifications directly to the above note. Agree with note above.  Patient with large brainstem hemorrhage with poor neurological exam with a near locked-in state.  He is received sedation and paralysis for PEG tube today hence exams quite limited.  Continue supportive care and management of respiratory failure and ventilation as per critical care team.  No family at the bedside today.  Discussed with critical care team. This patient is critically ill and at significant risk of neurological worsening, death and care requires constant monitoring of vital signs, hemodynamics,respiratory and cardiac monitoring, extensive review of multiple databases, frequent neurological assessment, discussion with family, other specialists and medical decision making of high complexity.I have made any additions or clarifications directly to the above note.This critical care time does not reflect procedure time, or teaching time or supervisory time of PA/NP/Med Resident etc but could involve care discussion time.  I spent 30 minutes of neurocritical care time  in the care of   this patient.      Delia Heady, MD Medical Director [facility] Stroke Center Pager: [phone] 11/12/2023 4:04 PM

## 2023-11-12 NOTE — Progress Notes (Addendum)
 The medicine urecholine has an end date at 3/25 at midnight. Patient was on this 25 mg for urinary retention. Tomorrow is day 3 for the foley and will be removed per protocol. eLink MD notified that medication ends at 0000 and this RN asked for it to be continued. MD stated "Not indicated, long-term effectiveness is negligible and won't do anything." So medication not restarted. Last dose given 3/24 @ 2139

## 2023-11-12 NOTE — TOC CM/SW Note (Signed)
 Transition of Care [facility]) - Inpatient Brief Assessment   Patient Details  Name: Jamen Loiseau MRN: [id number] Date of Birth: 03-14-1976  Transition of Care [facility]) CM/SW Contact:    Mearl Latin, LCSW Phone Number: 11/12/2023, 11:48 AM   Clinical Narrative: TOC received consult for LTACH. Unfortunately, patient is uninsured, which is not accepted by [facility].Financial Counseling working on [employer]. If patient requires Ventilator, placement will be outside of [place]. Will continue to follow.    Transition of Care Asessment: Insurance and Status: Selfpay Patient has primary care physician: No Home environment has been reviewed: From home Prior level of function:: Independent Prior/Current Home Services: No current home services Social Drivers of Health Review: SDOH reviewed no interventions necessary Readmission risk has been reviewed: Yes Transition of care needs: no transition of care needs at this time

## 2023-11-12 NOTE — Plan of Care (Signed)
  Problem: Education: Goal: Knowledge of disease or condition will improve Outcome: Progressing   Problem: Intracerebral Hemorrhage Tissue Perfusion: Goal: Complications of Intracerebral Hemorrhage will be minimized Outcome: Progressing   Problem: Health Behavior/Discharge Planning: Goal: Goals will be collaboratively established with patient/family Outcome: Progressing   Problem: Nutrition: Goal: Risk of aspiration will decrease Outcome: Progressing Goal: Dietary intake will improve Outcome: Progressing   Problem: Clinical Measurements: Goal: Diagnostic test results will improve Outcome: Progressing   Problem: Activity: Goal: Risk for activity intolerance will decrease Outcome: Progressing   Problem: Nutrition: Goal: Adequate nutrition will be maintained Outcome: Progressing   Problem: Pain Managment: Goal: General experience of comfort will improve and/or be controlled Outcome: Progressing   Problem: Safety: Goal: Ability to remain free from injury will improve Outcome: Progressing

## 2023-11-13 ENCOUNTER — Inpatient Hospital Stay (HOSPITAL_COMMUNITY): Payer: MEDICAID

## 2023-11-13 DIAGNOSIS — J690 Pneumonitis due to inhalation of food and vomit: Secondary | ICD-10-CM | POA: Diagnosis not present

## 2023-11-13 DIAGNOSIS — Z930 Tracheostomy status: Secondary | ICD-10-CM | POA: Diagnosis not present

## 2023-11-13 DIAGNOSIS — I10 Essential (primary) hypertension: Secondary | ICD-10-CM

## 2023-11-13 DIAGNOSIS — I613 Nontraumatic intracerebral hemorrhage in brain stem: Secondary | ICD-10-CM | POA: Diagnosis not present

## 2023-11-13 DIAGNOSIS — J9601 Acute respiratory failure with hypoxia: Secondary | ICD-10-CM | POA: Diagnosis not present

## 2023-11-13 DIAGNOSIS — N179 Acute kidney failure, unspecified: Secondary | ICD-10-CM

## 2023-11-13 LAB — GLUCOSE, CAPILLARY
Glucose-Capillary: 103 mg/dL — ABNORMAL HIGH (ref 70–99)
Glucose-Capillary: 104 mg/dL — ABNORMAL HIGH (ref 70–99)
Glucose-Capillary: 106 mg/dL — ABNORMAL HIGH (ref 70–99)
Glucose-Capillary: 113 mg/dL — ABNORMAL HIGH (ref 70–99)
Glucose-Capillary: 97 mg/dL (ref 70–99)
Glucose-Capillary: 99 mg/dL (ref 70–99)

## 2023-11-13 LAB — CBC
HCT: 25.7 % — ABNORMAL LOW (ref 39.0–52.0)
Hemoglobin: 7.7 g/dL — ABNORMAL LOW (ref 13.0–17.0)
MCH: 30.9 pg (ref 26.0–34.0)
MCHC: 30 g/dL (ref 30.0–36.0)
MCV: 103.2 fL — ABNORMAL HIGH (ref 80.0–100.0)
Platelets: 520 10*3/uL — ABNORMAL HIGH (ref 150–400)
RBC: 2.49 MIL/uL — ABNORMAL LOW (ref 4.22–5.81)
RDW: 14 % (ref 11.5–15.5)
WBC: 17 10*3/uL — ABNORMAL HIGH (ref 4.0–10.5)
nRBC: 0 % (ref 0.0–0.2)

## 2023-11-13 LAB — BASIC METABOLIC PANEL
Anion gap: 9 (ref 5–15)
BUN: 39 mg/dL — ABNORMAL HIGH (ref 6–20)
CO2: 24 mmol/L (ref 22–32)
Calcium: 8.8 mg/dL — ABNORMAL LOW (ref 8.9–10.3)
Chloride: 116 mmol/L — ABNORMAL HIGH (ref 98–111)
Creatinine, Ser: 1.2 mg/dL (ref 0.61–1.24)
GFR, Estimated: >60 mL/min (ref >60)
Glucose, Bld: 121 mg/dL — ABNORMAL HIGH (ref 70–99)
Potassium: 3.5 mmol/L (ref 3.5–5.1)
Sodium: 149 mmol/L — ABNORMAL HIGH (ref 135–145)

## 2023-11-13 MED ORDER — GUAIFENESIN 100 MG/5ML PO LIQD
30.0000 mL | Freq: Two times a day (BID) | ORAL | Status: DC
  Administered 2023-11-13 – 2023-11-27 (×29): 30 mL
  Filled 2023-11-13 (×30): qty 30

## 2023-11-13 MED ORDER — SODIUM CHLORIDE 3 % IN NEBU
4.0000 mL | INHALATION_SOLUTION | Freq: Four times a day (QID) | RESPIRATORY_TRACT | Status: AC
  Administered 2023-11-13 – 2023-11-16 (×8): 4 mL via RESPIRATORY_TRACT
  Filled 2023-11-13 (×10): qty 4

## 2023-11-13 MED ORDER — BETHANECHOL CHLORIDE 25 MG PO TABS
25.0000 mg | ORAL_TABLET | Freq: Four times a day (QID) | ORAL | Status: DC
  Administered 2023-11-13 – 2023-11-30 (×71): 25 mg
  Filled 2023-11-13 (×72): qty 1

## 2023-11-13 MED ORDER — GUAIFENESIN 100 MG/5ML PO LIQD: 30.0000 mL | Freq: Two times a day (BID) | ORAL | Status: DC

## 2023-11-13 MED ORDER — FUROSEMIDE 10 MG/ML IJ SOLN
40.0000 mg | Freq: Once | INTRAMUSCULAR | Status: AC
  Administered 2023-11-13: 40 mg via INTRAVENOUS
  Filled 2023-11-13: qty 4

## 2023-11-13 MED ORDER — ERYTHROMYCIN 5 MG/GM OP OINT
TOPICAL_OINTMENT | Freq: Three times a day (TID) | OPHTHALMIC | Status: AC
  Administered 2023-11-13 – 2023-11-19 (×20): 1 via OPHTHALMIC
  Filled 2023-11-13 (×3): qty 1

## 2023-11-13 MED ORDER — POLYVINYL ALCOHOL 1.4 % OP SOLN
1.0000 [drp] | OPHTHALMIC | Status: DC | PRN
  Administered 2024-02-21: 1 [drp] via OPHTHALMIC
  Filled 2023-11-13: qty 15

## 2023-11-13 MED ORDER — POTASSIUM CHLORIDE 20 MEQ PO PACK
40.0000 meq | PACK | Freq: Once | ORAL | Status: AC
  Administered 2023-11-13: 40 meq
  Filled 2023-11-13: qty 2

## 2023-11-13 MED ORDER — CHLORHEXIDINE GLUCONATE CLOTH 2 % EX PADS
6.0000 | MEDICATED_PAD | Freq: Every day | CUTANEOUS | Status: DC
  Administered 2023-11-13 – 2024-03-13 (×122): 6 via TOPICAL

## 2023-11-13 NOTE — Progress Notes (Signed)
 STROKE TEAM PROGRESS NOTE   INTERIM HISTORY/SUBJECTIVE  No family at the bedside.   Patient had PEG tube placed 3/24, wound is healthy.  He is tolerating tube feeds.  Neurological exam remains unchanged.  He is partially opening his eyes and has spontaneous ocular bobbing in the right eye and partial in the left eye.  Does not follow commands.  He has some intermittent flexion of his fingers in the left hand and minimal in the right hand but not voluntarily.  White count is slightly improved from yesterday and serum sodium slightly high at 146.   OBJECTIVE CBC    Component Value Date/Time   WBC 19.9 (H) 11/12/2023 0641   RBC 2.54 (L) 11/12/2023 0641   HGB 8.0 (L) 11/12/2023 0641   HCT 26.4 (L) 11/12/2023 0641   PLT 480 (H) 11/12/2023 0641   MCV 103.9 (H) 11/12/2023 0641   MCH 31.5 11/12/2023 0641   MCHC 30.3 11/12/2023 0641   RDW 14.2 11/12/2023 0641   LYMPHSABS 2.0 11/09/2023 0544   MONOABS 1.1 (H) 11/09/2023 0544   EOSABS 0.2 11/09/2023 0544   BASOSABS 0.0 11/09/2023 0544   BMET    Component Value Date/Time   NA 146 (H) 11/12/2023 0641   K 3.7 11/12/2023 0641   CL 113 (H) 11/12/2023 0641   CO2 25 11/12/2023 0641   GLUCOSE 140 (H) 11/12/2023 0641   BUN 42 (H) 11/12/2023 0641   CREATININE 1.28 (H) 11/12/2023 0641   CALCIUM 8.5 (L) 11/12/2023 0641   GFRNONAA >60 11/12/2023 0641   IMAGING past 24 hours No results found.     Vitals:   11/13/23 0400 11/13/23 0500 11/13/23 0600 11/13/23 0700  BP: 124/87 116/72 121/75 127/83  Pulse: 89 83 85 87  Resp: 20 18 18 19   Temp: 98.9 F (37.2 C)     TempSrc: Axillary     SpO2: 100% 99% 98% 95%  Weight:  103.7 kg    Height:       PHYSICAL EXAM General: Status post tracheostomy on ventilator in no acute distress CV: Regular rate and rhythm on monitor Respiratory: Mechanically ventilated  NEURO (off of sedation since 3/14): received paralytic 3/24 for PEG placement Mental Status:Obtunded, does   open eyes spontaneously  but not to voice or noxious stimuli, does not follow commands.  Cranial Nerves:  II: Pupils pinpoint, nonreactive III, IV, VI: Eyes a with slightly upward gaze, with intermittent ocular bobbing in the right greater than left eye disconjugate gaze he does not blink to threat bilaterally. V: Absent bilaterally VII: Face appears symmetrical resting with ET tube present IX, X: Cough and gag intact XI: Head is midline XII: ETT in place  Motor/Sensory: Rare isolated finger flexion in the left hand but not to command RUE: No response LUE: No response to noxious stimuli BLE no response to noxious stimuli  Coordination: Deferred Gait: Deferred  ASSESSMENT/PLAN  [name] is a 48 y.o. male with history of HTN admitted for ICH.    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 Basal cisterns and fourth ventricle are effaced without hydrocephalus at this time.  CTA head & neck no LVO CT repeat 3/9 no substantial change  Repeat Ct head 3/18 Interval decrease in size of subacute pontine hemorrhage, Decreased regional mass effect, Interval decrease in  scattered small volume subarachnoid and intraventricular hemorrhage. 2D Echo EF 60 to 65%, moderate concentric LVH, grade 1 diastolic dysfunction, normal left atrial size, no atrial level shunt LDL 96 HgbA1c 4.7 UDS positive for opiates VTE prophylaxis - continue SQ lovenox No antithrombotic prior to admission, continue No antithrombotic secondary to ICH Therapy recommendations:  Pending Disposition:  pending placement with trach   Brainstem compression Continued poor neurological exam 3% hypertonic off Continue FW 200 Q4 Na 154> 159 >157 >158->151->149->147->144->146-146  Hypertension Home meds:   None Stable Continue Norvasc, hydralazine, losartan, Lopressor Clonidine D/Cd in case of possible side effect of reduced sympathetic CNS nerve responses affecting neurological exam.  PRN labetalol  BP goal less than 160 Long-term BP goal normotensive  Hypoxic respiratory failure Aspiration PNA Intubated in the ED PCCM following, appreciate assitance SVT/VAP per CCM CXR Streaky left basilar opacity  Tmax 101.5->100.4->102.1->100.9->100.6->100.1->101 WBC 20.4-> 9.8 -> 13.2-> 18.1-14.1--15.5->18.3->13.2-13.3-19.9 Unasyn completed Trach placed 3/22  Dysphagia PEG tube placed 3/24 Continue tube feeding and free water 200 cc Q4h  Substance use UDS positive for opioids Narcan given in ED  Other Active Problems Hypokalemia: K 2.6 -> 4.2-> 3.3-> 3.4->4.1-> 3.5-> 3.8 -> 4.1->3.7 AKI, Cr 2.2-> 1.54 > 1.38> 1.37-1.71-1.56->1.16->1.75->1.26->1.28 Hypermagnesia: 3.0->2.9->3.0->3.2 Hyperphosphatemia: 4.9-4.8-3.6  Hospital day # 17  Pt seen by Neuro NP/APP and later by MD. Note/plan to be edited by MD as needed.    [name], DNP, AGACNP-BC [facility] Please use AMION for contact information & EPIC for messaging.  I have personally obtained history,examined this patient, reviewed notes, independently viewed imaging studies, participated in medical decision making and plan of care.ROS completed by me personally and pertinent positives fully documented  I have made any additions or clarifications directly to the above note. Agree with note above.  Patient with large pontine hemorrhage with poor neurological exam now status post tracheostomy and PEG tube.  Exam remains quite poor and patient is no longer following commands.  No family available at the bedside.  Continue ventilatory support and wean off ventilator as tolerated as per critical care team.  Discussed with Dr. Everardo All critical care medicine This patient is critically ill and at significant risk of neurological  worsening, death and care requires constant monitoring of vital signs, hemodynamics,respiratory and cardiac monitoring, extensive review of multiple databases, frequent neurological assessment, discussion with family, other specialists and medical decision making of high complexity.I have made any additions or clarifications directly to the above note.This critical care time does not reflect procedure time, or teaching time or supervisory time of PA/NP/Med Resident etc but could involve care discussion time.  I spent 30 minutes of neurocritical care time  in the care of  this patient.      Delia Heady, MD Medical Director [facility] Stroke Center Pager: [phone] 11/13/2023 2:44 PM

## 2023-11-13 NOTE — Progress Notes (Signed)
 Patient ID: Garrett [name], male   DOB: 02-12-1976, 48 y.o.   MRN: [id number] Noted emesis last night. He did have a lot of bloody secretions in his esophagus yesterday. Abd soft. PEG in place. Continue binder to protect PEG.   Violeta Gelinas, MD, MPH, FACS Please use [email or web address] to contact on call provider

## 2023-11-13 NOTE — Progress Notes (Signed)
 NAME:  Garrett [name], MRN:  [id number], DOB:  25-Oct-1975, LOS: 17 ADMISSION DATE:  10/27/2023, CONSULTATION DATE:  10/27/2023 REFERRING MD:  Coralee Pesa, DO CHIEF COMPLAINT:  Found Down  History of Present Illness:   48 y/o male with h/o HTN who was found down for unknown downtime by his [place] when she came home from work, he was having agonal breathing.  He apparently had driven her to work this morning.  He has HTN but never checks his BP and does not follow with MD.  She says you can tell when his BP is really high; his eyes get blood shot and he is sweating.  No h/o seizures.  Besides almost daily [name] and cigarette smoking, she is not aware of any other drugs. Drug screen positive for opioids and he was given several doses of Narcan. Patient found to have:  1. 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 Basal cisterns and fourth ventricle are effaced without hydrocephalus at this time. 2. Trace additional subarachnoid hemorrhage along the left parietal convexity. BP in ED 262/156. His cxr revealed a RUL and perihilar infiltrates suggesting aspiration pneumonia. ED labs also revealed PH 7.169, LA 4.4, CPK 985. Patient was intubated in ED.  Pertinent  Medical History  HTN  Significant Hospital Events: Including procedures, antibiotic start and stop dates in addition to other pertinent events   3/8: Intubated in ED, pontine bleed/SAH. CT head 17:09 Acute large hemorrhage 4.1 cm in pons with intraventricular extension without hydrocephalus 3/9: CTA 03:14 AM No change in IPH in pons, similar biparietal [facility] 3/14: Now awake, appears locked in can follow commands with eyes. 3/16 Purposeful with left upper extremity  3/22 tracheostomy placed at bedside, bleeding issues overnight from trach site 3/24 PEG (Dr. Janee Morn) 3/24 sputum culture with MRSA  Interim History  / Subjective:  Sputum culture MRSA, on vanc. Having mod secretions.  Objective   Blood pressure (!) 138/91, pulse 97, temperature 98.1 F (36.7 C), temperature source Axillary, resp. rate 19, height 5' 7.99" (1.727 m), weight 103.7 kg, SpO2 100%.    Vent Mode: PSV;CPAP FiO2 (%):  [40 %] 40 % Set Rate:  [18 bmp] 18 bmp Vt Set:  [550 mL] 550 mL PEEP:  [5 cmH20] 5 cmH20 Pressure Support:  [5 cmH20] 5 cmH20 Plateau Pressure:  [19 cmH20-20 cmH20] 20 cmH20   Intake/Output Summary (Last 24 hours) at 11/13/2023 0910 Last data filed at 11/13/2023 0800 Gross per 24 hour  Intake 2593.1 ml  Output 1400 ml  Net 1193.1 ml   Filed Weights   11/11/23 0500 11/12/23 0500 11/13/23 0500  Weight: 107.4 kg 102.7 kg 103.7 kg   Physical Exam: General: Adult male, critically ill but in NAD. Neuro: Somnolent, opens eyes and tracks, does not follow commands. HEENT: [place]/AT. Sclerae anicteric. Trach C/D/I. Some moderately thick secretions. Cardiovascular: RRR, no M/R/G.  Lungs: Respirations even and unlabored.  CTA bilaterally, No W/R/R. Abdomen: BS x 4, soft, NT/ND.  Musculoskeletal: No gross deformities, no edema.  Skin: Intact, warm, no rashes.  Ancillary Tests Personally Reviewed:   CT head on 3/9 shows extensive hemorrhage involving better part of the brainstem.  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  - Per stroke service - Maintain minimal sedation - Target SBP 130-150.   Acute hypoxic respiratory failure due to aspiration pneumonia -  s/p tracheostomy placement 3/22 MRSA PNA - sputum culture 3/24 - Continue routine trach care - SBT/WUA as able - Continue Vanc - Add guaifenesin per tube - Add Hypertonic nebs - Add Chest PT - Likely needs LTAC  Hypertension - Continue metoprolol, hydralazine, amlodipine   AKI  Hypernatremia - Gentle fluids - Continue free water - Follow BMP  Anemia - monitor - transfuse  for hemoglobin 7g/dL or lower  Poor Dentition -  A ouple upper right teeth and one left lower tooth have chipped off - Will need dental evaluation at some point  Summary -His neurological improvement has plateaued. recovery is likely to be partial but given patient's young age, it may be worth giving him more time to see if a reasonable functional recovery is possible.   Best Practice (right click and "Reselect all SmartList Selections" daily)   Diet/type: tubefeeds DVT prophylaxis LMWH Pressure ulcer(s): N/A GI prophylaxis: H2B Lines: N/A Foley:  external catheter.  Code Status:  DNR with pre-arrest interventions   CC time: 30 min.   Rutherford Guys, PA - C [facility] For pager details, please see AMION or use Epic chat  After 1900, please call [facility] for cross coverage needs 11/13/2023, 9:10 AM

## 2023-11-13 NOTE — Plan of Care (Signed)
  Problem: Education: Goal: Knowledge of disease or condition will improve Outcome: Not Progressing Goal: Knowledge of secondary prevention will improve (MUST DOCUMENT ALL) Outcome: Not Progressing Goal: Knowledge of patient specific risk factors will improve (DELETE if not current risk factor) Outcome: Not Progressing   Problem: Intracerebral Hemorrhage Tissue Perfusion: Goal: Complications of Intracerebral Hemorrhage will be minimized Outcome: Progressing   Problem: Coping: Goal: Will verbalize positive feelings about self Outcome: Not Progressing Goal: Will identify appropriate support needs Outcome: Not Progressing   Problem: Health Behavior/Discharge Planning: Goal: Ability to manage health-related needs will improve Outcome: Not Progressing Goal: Goals will be collaboratively established with patient/family Outcome: Progressing   Problem: Self-Care: Goal: Ability to participate in self-care as condition permits will improve Outcome: Not Progressing Goal: Verbalization of feelings and concerns over difficulty with self-care will improve Outcome: Not Progressing Goal: Ability to communicate needs accurately will improve Outcome: Not Progressing   Problem: Nutrition: Goal: Risk of aspiration will decrease Outcome: Progressing Goal: Dietary intake will improve Outcome: Not Progressing   Problem: Coping: Goal: Ability to adjust to condition or change in health will improve Outcome: Progressing   Problem: Fluid Volume: Goal: Ability to maintain a balanced intake and output will improve Outcome: Progressing   Problem: Metabolic: Goal: Ability to maintain appropriate glucose levels will improve Outcome: Progressing

## 2023-11-14 DIAGNOSIS — J15212 Pneumonia due to Methicillin resistant Staphylococcus aureus: Secondary | ICD-10-CM | POA: Diagnosis not present

## 2023-11-14 DIAGNOSIS — J9601 Acute respiratory failure with hypoxia: Secondary | ICD-10-CM | POA: Diagnosis not present

## 2023-11-14 DIAGNOSIS — I613 Nontraumatic intracerebral hemorrhage in brain stem: Secondary | ICD-10-CM | POA: Diagnosis not present

## 2023-11-14 DIAGNOSIS — G9341 Metabolic encephalopathy: Secondary | ICD-10-CM | POA: Diagnosis not present

## 2023-11-14 LAB — CULTURE, RESPIRATORY W GRAM STAIN

## 2023-11-14 LAB — GLUCOSE, CAPILLARY
Glucose-Capillary: 104 mg/dL — ABNORMAL HIGH (ref 70–99)
Glucose-Capillary: 90 mg/dL (ref 70–99)
Glucose-Capillary: 99 mg/dL (ref 70–99)
Glucose-Capillary: 92 mg/dL (ref 70–99)
Glucose-Capillary: 95 mg/dL (ref 70–99)
Glucose-Capillary: 94 mg/dL (ref 70–99)

## 2023-11-14 MED ORDER — FREE WATER
200.0000 mL | Status: DC
  Administered 2023-11-14 – 2023-11-15 (×5): 200 mL

## 2023-11-14 MED ORDER — POLYETHYLENE GLYCOL 3350 17 G PO PACK
17.0000 g | PACK | Freq: Two times a day (BID) | ORAL | Status: DC
  Administered 2023-11-14 – 2023-11-26 (×19): 17 g
  Filled 2023-11-14 (×20): qty 1

## 2023-11-14 MED ORDER — MUPIROCIN 2 % EX OINT
1.0000 | TOPICAL_OINTMENT | Freq: Two times a day (BID) | CUTANEOUS | Status: AC
  Administered 2023-11-14 – 2023-11-19 (×10): 1 via NASAL

## 2023-11-14 MED ORDER — FUROSEMIDE 10 MG/ML IJ SOLN
40.0000 mg | Freq: Once | INTRAMUSCULAR | Status: AC
  Administered 2023-11-14: 40 mg via INTRAVENOUS
  Filled 2023-11-14: qty 4

## 2023-11-14 MED ORDER — SENNA 8.6 MG PO TABS
1.0000 | ORAL_TABLET | Freq: Two times a day (BID) | ORAL | Status: DC
  Administered 2023-11-14 – 2023-11-26 (×23): 8.6 mg
  Filled 2023-11-14 (×23): qty 1

## 2023-11-14 NOTE — Progress Notes (Addendum)
 STROKE TEAM PROGRESS NOTE   INTERIM HISTORY/SUBJECTIVE No family at the bedside.  CPAP on the ventilator Patient today is awake and alert he is following commands on his left arm and leg and can move eyes up and down.  Withdraws right leg no response on arm    OBJECTIVE CBC    Component Value Date/Time   WBC 17.0 (H) 11/13/2023 0851   RBC 2.49 (L) 11/13/2023 0851   HGB 7.7 (L) 11/13/2023 0851   HCT 25.7 (L) 11/13/2023 0851   PLT 520 (H) 11/13/2023 0851   MCV 103.2 (H) 11/13/2023 0851   MCH 30.9 11/13/2023 0851   MCHC 30.0 11/13/2023 0851   RDW 14.0 11/13/2023 0851   LYMPHSABS 2.0 11/09/2023 0544   MONOABS 1.1 (H) 11/09/2023 0544   EOSABS 0.2 11/09/2023 0544   BASOSABS 0.0 11/09/2023 0544   BMET    Component Value Date/Time   NA 149 (H) 11/13/2023 0851   K 3.5 11/13/2023 0851   CL 116 (H) 11/13/2023 0851   CO2 24 11/13/2023 0851   GLUCOSE 121 (H) 11/13/2023 0851   BUN 39 (H) 11/13/2023 0851   CREATININE 1.20 11/13/2023 0851   CALCIUM 8.8 (L) 11/13/2023 0851   GFRNONAA >60 11/13/2023 0851   IMAGING past 24 hours DG Abd Portable 1V Result Date: 11/13/2023 CLINICAL DATA:  NG tube placement EXAM: PORTABLE ABDOMEN - 1 VIEW COMPARISON:  Chest radiograph 11/10/2023 FINDINGS: Enteric tube appears coiled within the left upper quadrant. Elevated right hemidiaphragm. Gaseous distended loops of bowel upper abdomen. IMPRESSION: Enteric tube appears coiled within the stomach. Electronically Signed   By: Annia Belt M.D.   On: 11/13/2023 16:53       Vitals:   11/14/23 0700 11/14/23 0800 11/14/23 0823 11/14/23 0824  BP: 131/87 (!) 129/90    Pulse: 88 88 85   Resp: 18 18 (!) 28   Temp:      TempSrc:      SpO2: 100% 98% 98% 97%  Weight:      Height:       PHYSICAL EXAM General: Status post tracheostomy on ventilator in no acute distress CV: Regular rate and rhythm on monitor Respiratory: Mechanically ventilated  NEURO (off of sedation since 3/14):  Mental Status:Obtunded,  does   open eyes spontaneously but not to voice or noxious stimuli, he is following commands this morning Cranial Nerves:  II: Pupils pinpoint, nonreactive III, IV, VI: Eyes a with slightly upward gaze, with intermittent ocular bobbing in the right greater than left eye disconjugate gaze he does not blink to threat bilaterally. V: Absent bilaterally VII: Face appears symmetrical resting with ET tube present IX, X: Cough and gag intact XI: Head is midline XII: ETT in place  Motor/Sensory: Rare isolated finger flexion in the left hand but not to command RUE/RLE: No response LUE: Can move hand at wrist and left and fingers LLE: Can wiggle toes  Coordination: Deferred Gait: Deferred  ASSESSMENT/PLAN  Garrett [name] is a 48 y.o. male with history of HTN admitted for ICH.    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 Basal cisterns and fourth ventricle are effaced without hydrocephalus at this time.  CTA head & neck no LVO CT repeat 3/9 no substantial change  Repeat Ct head 3/18 Interval decrease in size of  subacute pontine hemorrhage, Decreased regional mass effect, Interval decrease in scattered small volume subarachnoid and intraventricular hemorrhage. 2D Echo EF 60 to 65%, moderate concentric LVH, grade 1 diastolic dysfunction, normal left atrial size, no atrial level shunt LDL 96 HgbA1c 4.7 UDS positive for opiates VTE prophylaxis - continue SQ lovenox No antithrombotic prior to admission, continue No antithrombotic secondary to ICH Therapy recommendations:  Pending Disposition:  pending placement with trach   Brainstem compression Continued poor neurological exam 3% hypertonic off Continue FW 200 Q4 Na 154> 159 >157  >158->151->149->147->144->146-146  Hypertension Home meds:  None Stable Continue Norvasc, hydralazine, losartan, Lopressor Clonidine D/Cd in case of possible side effect of reduced sympathetic CNS nerve responses affecting neurological exam.  PRN labetalol  BP goal less than 160 Long-term BP goal normotensive  Hypoxic respiratory failure Aspiration PNA Intubated in the ED PCCM following, appreciate assitance SVT/VAP per CCM CXR Streaky left basilar opacity  Tmax 101.5->100.4->102.1->100.9->100.6->100.1->101 WBC 20.4-> 9.8 -> 13.2-> 18.1-14.1--15.5->18.3->13.2-13.3-19.9 Unasyn completed Trach placed 3/22  Dysphagia PEG tube placed 3/24 Continue tube feeding and free water 200 cc Q4h  Substance use UDS positive for opioids Narcan given in ED  Other Active Problems Hypokalemia: K 2.6 -> 4.2-> 3.3-> 3.4->4.1-> 3.5-> 3.8 -> 4.1->3.7 AKI, Cr 2.2-> 1.54 > 1.38> 1.37-1.71-1.56->1.16->1.75->1.26->1.28 Hypermagnesia: 3.0->2.9->3.0->3.2 Hyperphosphatemia: 4.9-4.8-3.6  Hospital day # 18  Pt seen by Neuro NP/APP and later by MD. Note/plan to be edited by MD as needed.    Gevena Mart DNP, ACNPC-AG  [facility]  I have personally obtained history,examined this patient, reviewed notes, independently viewed imaging studies, participated in medical decision making and plan of care.ROS completed by me personally and pertinent positives fully documented  I have made any additions or clarifications directly to the above note. Agree with note above.  Patient continues to require ventilatory support despite tracheostomy and is not able to wean.  His neurological exam is slightly improved with patient following commands with vertical gaze as well as moving fingers and left hand and left foot. Patient will likely need long-term ventilatory support for respiratory failure but options for this are limited.  No family at the bedside.  Discussed with critical care team. This patient is  critically ill and at significant risk of neurological worsening, death and care requires constant monitoring of vital signs, hemodynamics,respiratory and cardiac monitoring, extensive review of multiple databases, frequent neurological assessment, discussion with family, other specialists and medical decision making of high complexity.I have made any additions or clarifications directly to the above note.This critical care time does not reflect procedure time, or teaching time or supervisory time of PA/NP/Med Resident etc but could involve care discussion time.  I spent 30 minutes of neurocritical care time  in the care of  this patient.     Delia Heady, MD Medical Director [facility] Stroke Center Pager: [phone] 11/14/2023 4:09 PM

## 2023-11-14 NOTE — TOC CM/SW Note (Signed)
 Transition of Care [facility]) - Inpatient Brief Assessment   Patient Details  Name: Garrett [name] MRN: [id number] Date of Birth: 06-29-1976  Transition of Care [facility]) CM/SW Contact:    Mearl Latin, LCSW Phone Number: 11/14/2023, 10:12 AM   Clinical Narrative: CSW continuing to follow for Vent SNF vs. Trach SNF placement. Medicaid is pending per Financial Counseling.    Transition of Care Asessment: Insurance and Status: Selfpay Patient has primary care physician: No Home environment has been reviewed: From home Prior level of function:: Independent Prior/Current Home Services: No current home services Social Drivers of Health Review: SDOH reviewed no interventions necessary Readmission risk has been reviewed: Yes Transition of care needs: no transition of care needs at this time

## 2023-11-14 NOTE — Plan of Care (Signed)
  Problem: Education: Goal: Knowledge of disease or condition will improve Outcome: Not Progressing Goal: Knowledge of secondary prevention will improve (MUST DOCUMENT ALL) Outcome: Progressing Goal: Knowledge of patient specific risk factors will improve (DELETE if not current risk factor) Outcome: Not Progressing   Problem: Intracerebral Hemorrhage Tissue Perfusion: Goal: Complications of Intracerebral Hemorrhage will be minimized Outcome: Not Progressing   Problem: Coping: Goal: Will verbalize positive feelings about self Outcome: Not Progressing Goal: Will identify appropriate support needs Outcome: Not Progressing   Problem: Health Behavior/Discharge Planning: Goal: Ability to manage health-related needs will improve Outcome: Not Progressing Goal: Goals will be collaboratively established with patient/family Outcome: Progressing   Problem: Self-Care: Goal: Ability to participate in self-care as condition permits will improve Outcome: Not Progressing Goal: Verbalization of feelings and concerns over difficulty with self-care will improve Outcome: Not Progressing Goal: Ability to communicate needs accurately will improve Outcome: Not Progressing   Problem: Nutrition: Goal: Risk of aspiration will decrease Outcome: Progressing Goal: Dietary intake will improve Outcome: Not Progressing   Problem: Coping: Goal: Ability to adjust to condition or change in health will improve Outcome: Not Progressing   Problem: Fluid Volume: Goal: Ability to maintain a balanced intake and output will improve Outcome: Progressing

## 2023-11-14 NOTE — Progress Notes (Signed)
 NAME:  Garrett [name], MRN:  [id number], DOB:  21-Jan-1976, LOS: 18 ADMISSION DATE:  10/27/2023, CONSULTATION DATE:  10/27/2023 REFERRING MD:  Coralee Pesa, DO CHIEF COMPLAINT:  Found Down  History of Present Illness:   48 y/o male with h/o HTN who was found down for unknown downtime by his [place] when she came home from work, he was having agonal breathing.  He apparently had driven her to work this morning.  He has HTN but never checks his BP and does not follow with MD.  She says you can tell when his BP is really high; his eyes get blood shot and he is sweating.  No h/o seizures.  Besides almost daily [name] and cigarette smoking, she is not aware of any other drugs. Drug screen positive for opioids and he was given several doses of Narcan. Patient found to have:  1. 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 Basal cisterns and fourth ventricle are effaced without hydrocephalus at this time. 2. Trace additional subarachnoid hemorrhage along the left parietal convexity. BP in ED 262/156. His cxr revealed a RUL and perihilar infiltrates suggesting aspiration pneumonia. ED labs also revealed PH 7.169, LA 4.4, CPK 985. Patient was intubated in ED.  Pertinent  Medical History  HTN  Significant Hospital Events: Including procedures, antibiotic start and stop dates in addition to other pertinent events   3/8: Intubated in ED, pontine bleed/SAH. CT head 17:09 Acute large hemorrhage 4.1 cm in pons with intraventricular extension without hydrocephalus 3/9: CTA 03:14 AM No change in IPH in pons, similar biparietal [facility] 3/14: Now awake, appears locked in can follow commands with eyes. 3/16 Purposeful with left upper extremity  3/22 tracheostomy placed at bedside, bleeding issues overnight from trach site 3/24 PEG (Dr. Janee Morn) 3/24 sputum culture with MRSA  Interim History  / Subjective:  Diuresed yesterday, -2.2L. Net -1.8L net.  Objective   Blood pressure (!) 129/90, pulse 85, temperature 99.9 F (37.7 C), temperature source Axillary, resp. rate (!) 28, height 5' 7.99" (1.727 m), weight 102 kg, SpO2 97%.    Vent Mode: PSV FiO2 (%):  [30 %-40 %] 30 % Set Rate:  [18 bmp] 18 bmp Vt Set:  [550 mL] 550 mL PEEP:  [5 cmH20] 5 cmH20 Pressure Support:  [5 cmH20-10 cmH20] 10 cmH20 Plateau Pressure:  [17 cmH20-19 cmH20] 17 cmH20   Intake/Output Summary (Last 24 hours) at 11/14/2023 0850 Last data filed at 11/14/2023 0600 Gross per 24 hour  Intake 722.4 ml  Output 2900 ml  Net -2177.6 ml   Filed Weights   11/12/23 0500 11/13/23 0500 11/14/23 0500  Weight: 102.7 kg 103.7 kg 102 kg   Physical Exam: General: Adult male, critically ill but in NAD. Neuro: Somnolent, opens eyes very slightly, does not follow commands. HEENT: [place]/AT. Sclerae anicteric. Trach C/D/I. Some moderately thick secretions though improved from yesterday. Cardiovascular: RRR, no M/R/G.  Lungs: Respirations even and unlabored.  CTA bilaterally, No W/R/R. Abdomen: BS x 4, soft, NT/ND.  Musculoskeletal: No gross deformities, no edema.  Skin: Intact, warm, no rashes.  Ancillary Tests Personally Reviewed:   CT head on 3/9 shows extensive hemorrhage involving better part of the brainstem.  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  - Per stroke service. - Maintain minimal sedation. - Target SBP 130-150.   Acute hypoxic respiratory failure  due to aspiration pneumonia - s/p tracheostomy placement 3/22. MRSA PNA - sputum culture 3/24. - Continue routine trach care. - SBT/WUA as able, mental status biggest issue with secretions playing a small role as well. - Continue Vanc. - Continue guaifenesin per tube, Hypertonic nebs, Chest PT. - Will need LTAC referral - TOC consult placed.  Hypertension. - Continue metoprolol,  hydralazine, amlodipine.   AKI.  Hypernatremia -slightly worse after diuresis but renal function a bit improved - Hold fluids, hold further diuresis today as -1.9L net (2.2L UOP yesterday after 1 dose Lasix). - Continue free water. - Follow BMP.  Anemia. - monitor. - transfuse for hemoglobin 7g/dL or lower.  Poor Dentition. -  A ouple upper right teeth and one left lower tooth have chipped off. - Will need dental evaluation at some point.  Summary - His neurological improvement has plateaued. recovery is likely to be partial but given patient's young age, it may be worth giving him more time to see if a reasonable functional recovery is possible.   Best Practice (right click and "Reselect all SmartList Selections" daily)   Diet/type: tubefeeds DVT prophylaxis LMWH Pressure ulcer(s): N/A GI prophylaxis: H2B Lines: N/A Foley:  external catheter.  Code Status:  DNR with pre-arrest interventions   CC time: 30 min.   Rutherford Guys, PA - C [facility] For pager details, please see AMION or use Epic chat  After 1900, please call [facility] for cross coverage needs 11/14/2023, 8:50 AM

## 2023-11-15 ENCOUNTER — Inpatient Hospital Stay (HOSPITAL_COMMUNITY): Payer: MEDICAID

## 2023-11-15 DIAGNOSIS — I613 Nontraumatic intracerebral hemorrhage in brain stem: Secondary | ICD-10-CM | POA: Diagnosis not present

## 2023-11-15 DIAGNOSIS — E870 Hyperosmolality and hypernatremia: Secondary | ICD-10-CM | POA: Diagnosis not present

## 2023-11-15 DIAGNOSIS — J9601 Acute respiratory failure with hypoxia: Secondary | ICD-10-CM | POA: Diagnosis not present

## 2023-11-15 DIAGNOSIS — N179 Acute kidney failure, unspecified: Secondary | ICD-10-CM | POA: Diagnosis not present

## 2023-11-15 LAB — CBC
HCT: 28.3 % — ABNORMAL LOW (ref 39.0–52.0)
Hemoglobin: 8.7 g/dL — ABNORMAL LOW (ref 13.0–17.0)
MCH: 31.6 pg (ref 26.0–34.0)
MCHC: 30.7 g/dL (ref 30.0–36.0)
MCV: 102.9 fL — ABNORMAL HIGH (ref 80.0–100.0)
Platelets: 667 10*3/uL — ABNORMAL HIGH (ref 150–400)
RBC: 2.75 MIL/uL — ABNORMAL LOW (ref 4.22–5.81)
RDW: 13.8 % (ref 11.5–15.5)
WBC: 14.1 10*3/uL — ABNORMAL HIGH (ref 4.0–10.5)
nRBC: 0 % (ref 0.0–0.2)

## 2023-11-15 LAB — GLUCOSE, CAPILLARY
Glucose-Capillary: 98 mg/dL (ref 70–99)
Glucose-Capillary: 92 mg/dL (ref 70–99)
Glucose-Capillary: 104 mg/dL — ABNORMAL HIGH (ref 70–99)
Glucose-Capillary: 116 mg/dL — ABNORMAL HIGH (ref 70–99)
Glucose-Capillary: 118 mg/dL — ABNORMAL HIGH (ref 70–99)
Glucose-Capillary: 108 mg/dL — ABNORMAL HIGH (ref 70–99)

## 2023-11-15 LAB — BASIC METABOLIC PANEL WITH GFR
Anion gap: 12 (ref 5–15)
Anion gap: 14 (ref 5–15)
Anion gap: 15 (ref 5–15)
BUN: 34 mg/dL — ABNORMAL HIGH (ref 6–20)
BUN: 30 mg/dL — ABNORMAL HIGH (ref 6–20)
BUN: 29 mg/dL — ABNORMAL HIGH (ref 6–20)
CO2: 23 mmol/L (ref 22–32)
CO2: 21 mmol/L — ABNORMAL LOW (ref 22–32)
CO2: 22 mmol/L (ref 22–32)
Calcium: 8.7 mg/dL — ABNORMAL LOW (ref 8.9–10.3)
Calcium: 8.9 mg/dL (ref 8.9–10.3)
Calcium: 9.1 mg/dL (ref 8.9–10.3)
Chloride: 121 mmol/L — ABNORMAL HIGH (ref 98–111)
Chloride: 121 mmol/L — ABNORMAL HIGH (ref 98–111)
Chloride: 117 mmol/L — ABNORMAL HIGH (ref 98–111)
Creatinine, Ser: 1.17 mg/dL (ref 0.61–1.24)
Creatinine, Ser: 1.21 mg/dL (ref 0.61–1.24)
Creatinine, Ser: 1.11 mg/dL (ref 0.61–1.24)
GFR, Estimated: >60 mL/min (ref >60)
GFR, Estimated: >60 mL/min (ref >60)
GFR, Estimated: >60 mL/min (ref >60)
Glucose, Bld: 98 mg/dL (ref 70–99)
Glucose, Bld: 109 mg/dL — ABNORMAL HIGH (ref 70–99)
Glucose, Bld: 116 mg/dL — ABNORMAL HIGH (ref 70–99)
Potassium: 3.4 mmol/L — ABNORMAL LOW (ref 3.5–5.1)
Potassium: 3.5 mmol/L (ref 3.5–5.1)
Potassium: 3.4 mmol/L — ABNORMAL LOW (ref 3.5–5.1)
Sodium: 156 mmol/L — ABNORMAL HIGH (ref 135–145)
Sodium: 156 mmol/L — ABNORMAL HIGH (ref 135–145)
Sodium: 154 mmol/L — ABNORMAL HIGH (ref 135–145)

## 2023-11-15 LAB — VANCOMYCIN, PEAK: Vancomycin Pk: 33 ug/mL (ref 30–40)

## 2023-11-15 LAB — VANCOMYCIN, TROUGH
Vancomycin Tr: 43 ug/mL (ref 15–20)
Vancomycin Tr: 13 ug/mL — ABNORMAL LOW (ref 15–20)

## 2023-11-15 LAB — MAGNESIUM: Magnesium: 2.5 mg/dL — ABNORMAL HIGH (ref 1.7–2.4)

## 2023-11-15 MED ORDER — FUROSEMIDE 10 MG/ML IJ SOLN
10.0000 mg | Freq: Every day | INTRAMUSCULAR | Status: DC
  Administered 2023-11-15: 10 mg via INTRAVENOUS
  Filled 2023-11-15: qty 2

## 2023-11-15 MED ORDER — LACTULOSE 10 GM/15ML PO SOLN: 30.0000 g | Freq: Three times a day (TID) | ORAL | Status: DC

## 2023-11-15 MED ORDER — POTASSIUM CHLORIDE 20 MEQ PO PACK
40.0000 meq | PACK | Freq: Once | ORAL | Status: AC
  Administered 2023-11-15: 40 meq
  Filled 2023-11-15: qty 2

## 2023-11-15 MED ORDER — FREE WATER
200.0000 mL | Freq: Four times a day (QID) | Status: DC
  Administered 2023-11-15 – 2023-11-18 (×12): 200 mL

## 2023-11-15 MED ORDER — METOCLOPRAMIDE HCL 5 MG/ML IJ SOLN
10.0000 mg | Freq: Four times a day (QID) | INTRAMUSCULAR | Status: AC
  Administered 2023-11-15 – 2023-11-17 (×8): 10 mg via INTRAVENOUS
  Filled 2023-11-15 (×8): qty 2

## 2023-11-15 MED ORDER — FUROSEMIDE 20 MG PO TABS: 20.0000 mg | ORAL_TABLET | Freq: Every day | ORAL | Status: DC

## 2023-11-15 MED ORDER — DEXTROSE 5 % IV SOLN
INTRAVENOUS | Status: DC
  Filled 2023-11-15: qty 1000

## 2023-11-15 MED ORDER — VANCOMYCIN HCL 1250 MG/250ML IV SOLN
1250.0000 mg | INTRAVENOUS | Status: AC
  Administered 2023-11-15 – 2023-11-18 (×4): 1250 mg via INTRAVENOUS
  Filled 2023-11-15 (×4): qty 250

## 2023-11-15 MED ORDER — POTASSIUM CHLORIDE 10 MEQ/100ML IV SOLN
10.0000 meq | INTRAVENOUS | Status: AC
  Administered 2023-11-15 (×2): 10 meq via INTRAVENOUS
  Filled 2023-11-15 (×2): qty 100

## 2023-11-15 NOTE — TOC CM/SW Note (Signed)
 Transition of Care [facility]) - Inpatient Brief Assessment   Patient Details  Name: Garrett [name] MRN: [id number] Date of Birth: [birth year]  Transition of Care [facility]) CM/SW Contact:    Mearl Latin, LCSW Phone Number: 11/15/2023, 3:49 PM   Clinical Narrative: CSW requested update from [place] with Financial Counseling to determine date of Disability and Medicaid application. [place] Vent SNFs will require family to complete a [place] application but need to ensure Disability application has been completed.    Transition of Care Asessment: Insurance and Status: Selfpay Patient has primary care physician: No Home environment has been reviewed: From home Prior level of function:: Independent Prior/Current Home Services: No current home services Social Drivers of Health Review: SDOH reviewed no interventions necessary Readmission risk has been reviewed: Yes Transition of care needs: no transition of care needs at this time

## 2023-11-15 NOTE — Progress Notes (Signed)
 NAME:  Garrett [name], MRN:  [id number], DOB:  [birth year], LOS: 19 ADMISSION DATE:  10/27/2023, CONSULTATION DATE:  10/27/2023 REFERRING MD:  Coralee Pesa, DO CHIEF COMPLAINT:  Found Down  History of Present Illness:   48 y/o male with h/o HTN who was found down for unknown downtime by his [place] when she came home from work, he was having agonal breathing.  He apparently had driven her to work this morning.  He has HTN but never checks his BP and does not follow with MD.  She says you can tell when his BP is really high; his eyes get blood shot and he is sweating.  No h/o seizures.  Besides almost daily [name] and cigarette smoking, she is not aware of any other drugs. Drug screen positive for opioids and he was given several doses of Narcan. Patient found to have:  1. 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 Basal cisterns and fourth ventricle are effaced without hydrocephalus at this time. 2. Trace additional subarachnoid hemorrhage along the left parietal convexity. BP in ED 262/156. His cxr revealed a RUL and perihilar infiltrates suggesting aspiration pneumonia. ED labs also revealed PH 7.169, LA 4.4, CPK 985. Patient was intubated in ED.  Pertinent  Medical History  HTN  Significant Hospital Events: Including procedures, antibiotic start and stop dates in addition to other pertinent events   3/8: Intubated in ED, pontine bleed/SAH. CT head 17:09 Acute large hemorrhage 4.1 cm in pons with intraventricular extension without hydrocephalus 3/9: CTA 03:14 AM No change in IPH in pons, similar biparietal [facility] 3/14: Now awake, appears locked in can follow commands with eyes. 3/16 Purposeful with left upper extremity  3/22 tracheostomy placed at bedside, bleeding issues overnight from trach site 3/24 PEG (Dr. Janee Morn) 3/24 sputum culture with MRSA  Interim History  / Subjective:  Intermittently following commands Vomited   Objective   Blood pressure (!) 149/104, pulse (!) 111, temperature 99.3 F (37.4 C), temperature source Axillary, resp. rate (!) 24, height 5' 7.99" (1.727 m), weight 102 kg, SpO2 100%.    Vent Mode: PSV FiO2 (%):  [30 %-40 %] 30 % Set Rate:  [18 bmp] 18 bmp Vt Set:  [550 mL] 550 mL PEEP:  [5 cmH20] 5 cmH20 Pressure Support:  [12 cmH20] 12 cmH20 Plateau Pressure:  [12 cmH20-19 cmH20] 19 cmH20   Intake/Output Summary (Last 24 hours) at 11/15/2023 0915 Last data filed at 11/15/2023 0600 Gross per 24 hour  Intake 1500.83 ml  Output 2800 ml  Net -1299.17 ml   Filed Weights   11/13/23 0500 11/14/23 0500 11/15/23 0500  Weight: 103.7 kg 102 kg 102 kg   Physical Exam: General awake. Intermittently will follow commands  HENT trach midline MM moist Pulm scattered rhonchi. Good volumes on PSV 12 no accessory use  PCXR low volume. 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  Plan Minimizing sedating meds Supportive care Bp goal < 160   Nausea/vomiting/ileus Plan Holding TFs Adding reglan Get K > 4 and Mg > 2 Bowel regimen started.  F/u I view abd r/o obstruction (preliminarily favor ileus)   Dysphagia Plan Tube feeds via PEG on hold d/t  vomiting   Acute hypoxic respiratory failure due to aspiration pneumonia - s/p tracheostomy placement 3/22. MRSA PNA - sputum culture 3/24. CXR improved  Mental status and ileus barriers to ATC today  Plan Cont nocturnal full support w/ daily assessment for SBT and ATC Cont routine trach care  Can remove trach sutures 3/29 Day 4 of 7 vanc for MRSA PNA  continue guaifenesin per tube, Hypertonic nebs, Chest PT. Will need LTAC referral - TOC consult  placed.  Hypertension. Plan Continue metoprolol, hydralazine, amlodipine.   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 Plan Trend cbc Transfuse for hgb < 7  Poor Dentition. -  A couple upper right teeth and one left lower tooth have chipped off. plan Will need dental evaluation at some point.    Best Practice (right click and "Reselect all SmartList Selections" daily)   Diet/type: tubefeeds DVT prophylaxis LMWH Pressure ulcer(s): N/A GI prophylaxis: H2B Lines: N/A Foley:  external catheter.  Code Status:  DNR with pre-arrest interventions     My cct 34 min

## 2023-11-15 NOTE — Progress Notes (Signed)
 SLP Cancellation Note  Patient Details Name: Garrett [name] MRN: [id number] DOB: 1976/08/02   Cancelled treatment:        Per chart review, pt is still requiring mechanical ventilation at this time and is not appropriate for SLP evaluation.  Will continue to follow for medical readiness.   Kerrie Pleasure, MA, CCC-SLP Acute Rehabilitation Services Office: [phone] 11/15/2023, 7:37 AM

## 2023-11-15 NOTE — Plan of Care (Signed)
 Palliative-   Chart reviewed. Patient tolerating some weaning on vent. Following commands with L upper and lower extremities. Blinking eyes for communication.   Recommend consideration of SLP consult for cognitive/linguistic eval and for recommendations for communication devices.   Ocie Bob, AGNP-C Palliative Medicine  No charge

## 2023-11-15 NOTE — Progress Notes (Signed)
 Pharmacy Antibiotic Note  Garrett [name] is a 48 y.o. male admitted on 10/27/2023 with pneumonia.  Pharmacy has been consulted for vancomycin dosing.  Vancomycin peak 33 mcg/ml (taken past last night's dose), vancomycin trough 13 mcg/ml before tonights dose - will extrapolate times and use these levels to calculate AUC  *of note, Vanc trough 43 this morning was drawn while dose was hanging so inaccurate*  Calculated AUC 555 (goal 400-550) - just slightly over goal so will change dose to 1250mg  IV q24h  Plan: Change Vancomycin to 1250mg  IV every 24 hours (eAUC 462) - ends 3/30 Follow renal function, levels again if renal function changes   Height: 5' 7.99" (172.7 cm) Weight: 102 kg (224 lb 13.9 oz) IBW/kg (Calculated) : 68.38  Temp (24hrs), Avg:99.3 F (37.4 C), Min:98.5 F (36.9 C), Max:100.2 F (37.9 C)  Recent Labs  Lab 11/09/23 0544 11/10/23 1138 11/11/23 0451 11/12/23 0641 11/13/23 0851 11/15/23 0008 11/15/23 0955 11/15/23 1726 11/15/23 2114  WBC 13.3*  --  15.2* 19.9* 17.0* 14.1*  --   --   --   CREATININE 1.26*   < > 1.25* 1.28* 1.20 1.17 1.21 1.11  --   VANCOTROUGH  --   --   --   --   --   --  43*  --  13*  VANCOPEAK  --   --   --   --   --  33  --   --   --    < > = values in this interval not displayed.    Estimated Creatinine Clearance: 95.2 mL/min (by C-G formula based on SCr of 1.11 mg/dL).    Allergies  Allergen Reactions   Tomato Hives, Itching and Rash    Antimicrobials this admission: Cefepime 3/8 x1 Unasyn 3/9 >> 3/14; 3/23 >>3/24 Vancomycin 3/8 x1, 3/24>> [3/30]  Microbiology results: 3/8 MRSA PCR - positive 3/8 BCx - neg UA/CXR negative 3/19 BAL 3/23: MRSA  Thank you for allowing pharmacy to be a part of this patient's care.  Christoper Fabian, PharmD, BCPS Please see amion for complete clinical pharmacist phone list 11/15/2023 10:03 PM

## 2023-11-15 NOTE — Evaluation (Signed)
 Physical Therapy Evaluation Patient Details Name: Garrett [name] MRN: [id number] DOB: Apr 30, 1976 Today's Date: 11/15/2023  History of Present Illness  Pt is a 48 yo male who was found down 10/27/23 with agonal breathing. 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 Intubated 3/8, cortrak placed 3/14, trach placed 3/22, PEG placed 3/24. PMH: HTN, smoker   Clinical Impression  Pt presents with condition above and deficits mentioned below, see PT Problem List. Pt is currently lethargic and not following commands. No family is currently present to provide PLOF or home info either, but per chart, pt seemed to be independent and driving and living with his fiance at baseline. Per RN and chart, the pt was following commands with his L extremities for the MD earlier today. His ability to follow commands appears to fluctuate as he was unable to follow commands this session, but also was limited by lethargy. He woke up more once dependently sat up on EOB, but he maintained a forward gaze (eyes roaming up/down spontaneously) and did not attempt to make eye contact or track therapist when cued. Pt spontaneously would move his trunk, neck, and upper extremities but did not follow commands. He did withdraw his bil lower extremities to painful stimuli and was quicker to do so on his L than his R. He was also noted to have some increased tone in his R lower extremity. Hopefully the pt's ability to follow commands and participate continues to improve and become more consistent and therefore ideally allow him to become a good candidate for intensive inpatient rehab, > 3 hours/day, as the pt is young and has had a drastic functional decline. Will continue to follow acutely.       If plan is discharge home, recommend the following: Two people to help with  walking and/or transfers;Two people to help with bathing/dressing/bathroom;Assistance with cooking/housework;Assistance with feeding;Direct supervision/assist for medications management;Direct supervision/assist for financial management;Assist for transportation;Help with stairs or ramp for entrance;Supervision due to cognitive status   Can travel by private vehicle        Equipment Recommendations Jensen lift;Hospital bed;Wheelchair (measurements PT);Wheelchair cushion (measurements PT);BSC/3in1 (pending progress)  Recommendations for Other Services  Rehab consult    Functional Status Assessment Patient has had a recent decline in their functional status and demonstrates the ability to make significant improvements in function in a reasonable and predictable amount of time.     Precautions / Restrictions Precautions Precautions: Fall;Other (comment) Recall of Precautions/Restrictions: Impaired Precaution/Restrictions Comments: trach on vent, PEG, abdominal binder, SBP < 160 Restrictions Weight Bearing Restrictions Per Provider Order: No      Mobility  Bed Mobility Overal bed mobility: Needs Assistance Bed Mobility: Rolling, Sidelying to Sit, Sit to Supine Rolling: Total assist, +2 for physical assistance, +2 for safety/equipment Sidelying to sit: Total assist, +2 for physical assistance, +2 for safety/equipment, HOB elevated   Sit to supine: Total assist, +2 for physical assistance, +2 for safety/equipment, HOB elevated   General bed mobility comments: Cues provided for bringing legs off EOB to sit up L EOB and for leaning onto L elbow to descend trunk to return to supine, but pt not following cues this date. Pt required total assist x2 to roll to L then sit up and return to supine. Rolled pt to L end of session for pressure relief purposes.    Transfers  General transfer comment: deferred, pt not following cues currently or able to support himself in  sitting    Ambulation/Gait               General Gait Details: deferred, pt not following cues currently or able to support himself in sitting  Stairs            Wheelchair Mobility     Tilt Bed    Modified Rankin (Stroke Patients Only) Modified Rankin (Stroke Patients Only) Pre-Morbid Rankin Score: No symptoms Modified Rankin: Severe disability     Balance Overall balance assessment: Needs assistance Sitting-balance support: Bilateral upper extremity supported, No upper extremity supported, Feet supported Sitting balance-Leahy Scale: Zero Sitting balance - Comments: Pt demonstrates a R and posterior lean, requiring total assist for static sitting balance. If propped into a good position he could maintain his balance briefly before losing balance posteriorly and to the R again and needing total assist to recover. Pt sits with his neck flexed and needs max-total assist to lift his head Postural control: Posterior lean, Right lateral lean     Standing balance comment: deferred, pt not following cues currently or able to support himself in sitting                             Pertinent Vitals/Pain Pain Assessment Pain Assessment: Faces Faces Pain Scale: Hurts a little bit Pain Location: withdrew from painful stimuli Pain Descriptors / Indicators: Guarding Pain Intervention(s): Limited activity within patient's tolerance, Monitored during session, Repositioned    Home Living Family/patient expects to be discharged to:: Unsure                   Additional Comments: per chart, it appears pt lived with a significant other    Prior Function Prior Level of Function : Independent/Modified Independent;Driving;Patient poor historian/Family not available             Mobility Comments: Unsure as pt is unable to communicate and no family is currently present, but per chart pt had drove his fiance to work the day he was admitted, and based on age he  was likely independent PTA       Extremity/Trunk Assessment   Upper Extremity Assessment Upper Extremity Assessment: Defer to OT evaluation    Lower Extremity Assessment Lower Extremity Assessment: Generalized weakness;RLE deficits/detail;LLE deficits/detail RLE Deficits / Details: noted modified ashworth scale score of 1 plus in hamstrings, 3 in hip adductors; WFL ankle ROM; no clonus noted; withdraws to painful stimuli but otherwise did not move legs to command LLE Deficits / Details: noted occasional stiffness with PROM of leg but inconsistent, so likely pt resistance; WFL ankle ROM; no clonus noted; withdraws to painful stimuli but otherwise did not move legs to command, quicker to withdraw on L than on R    Cervical / Trunk Assessment Cervical / Trunk Assessment:  (forward head)  Communication   Communication Communication: Impaired Factors Affecting Communication: Trach/intubated    Cognition Arousal: Lethargic Behavior During Therapy: Flat affect   PT - Cognitive impairments: Difficult to assess Difficult to assess due to: Level of arousal                     PT - Cognition Comments: Pt initially with eyes mostly closed and not opening to stimuli while supine. His eyes opened once sitting up and remained open once supine again, but pt maintained a forward gaze  with rhythmic vertical movement of eyes. Pt did not attempt to make eye contact or track therapist when cued. No intentional active movements noted, but spontaneously moves trunk, UEs, and neck. Withdraws to painful stimuli in legs. Did not follow cues today for PT, but per RN and MD pt did follow cues with his L side earlier today. Command following capabilities seem to fluctuate Following commands: Impaired Following commands impaired:  (did not follow commands)     Cueing Cueing Techniques: Verbal cues, Gestural cues, Tactile cues, Visual cues     General Comments General comments (skin integrity, edema,  etc.): VSS trach on vent 30% FiO2 PEEP 5    Exercises     Assessment/Plan    PT Assessment Patient needs continued PT services  PT Problem List Decreased strength;Decreased range of motion;Decreased activity tolerance;Decreased balance;Decreased mobility;Decreased coordination;Decreased cognition;Decreased knowledge of use of DME;Decreased safety awareness;Cardiopulmonary status limiting activity;Impaired sensation;Impaired tone       PT Treatment Interventions Gait training;DME instruction;Functional mobility training;Therapeutic activities;Therapeutic exercise;Balance training;Cognitive remediation;Neuromuscular re-education;Wheelchair mobility training;Patient/family education    PT Goals (Current goals can be found in the Care Plan section)  Acute Rehab PT Goals Patient Stated Goal: did not state PT Goal Formulation: Patient unable to participate in goal setting Time For Goal Achievement: 11/29/23 Potential to Achieve Goals: Fair    Frequency Min 3X/week     Co-evaluation               AM-PAC PT "6 Clicks" Mobility  Outcome Measure Help needed turning from your back to your side while in a flat bed without using bedrails?: Total Help needed moving from lying on your back to sitting on the side of a flat bed without using bedrails?: Total Help needed moving to and from a bed to a chair (including a wheelchair)?: Total Help needed standing up from a chair using your arms (e.g., wheelchair or bedside chair)?: Total Help needed to walk in hospital room?: Total Help needed climbing 3-5 steps with a railing? : Total 6 Click Score: 6    End of Session Equipment Utilized During Treatment: Oxygen Activity Tolerance: Patient limited by lethargy;Other (comment) (limited by ability to follow commands) Patient left: in bed;with call bell/phone within reach;with bed alarm set;Other (comment) (rolled to L) Nurse Communication: Mobility status;Other (comment) (request for prevalon  boots, rolled pt to L for pressure relief end of session) PT Visit Diagnosis: Muscle weakness (generalized) (M62.81);Difficulty in walking, not elsewhere classified (R26.2);Other symptoms and signs involving the nervous system (R29.898);Hemiplegia and hemiparesis Hemiplegia - Right/Left: Right Hemiplegia - caused by: Nontraumatic intracerebral hemorrhage    Time: 9147-8295 PT Time Calculation (min) (ACUTE ONLY): 38 min   Charges:   PT Evaluation $PT Eval Moderate Complexity: 1 Mod PT Treatments $Therapeutic Activity: 23-37 mins PT General Charges $$ ACUTE PT VISIT: 1 Visit         Virgil Benedict, PT, DPT Acute Rehabilitation Services  Office: [phone]   Bettina Gavia 11/15/2023, 5:01 PM

## 2023-11-15 NOTE — Progress Notes (Signed)
 [facility] ADULT ICU REPLACEMENT PROTOCOL   The patient does apply for the [facility] Adult ICU Electrolyte Replacment Protocol based on the criteria listed below:   1.Exclusion criteria: TCTS, ECMO, Dialysis, and Myasthenia Gravis patients 2. Is GFR >/= 30 ml/min? Yes.    Patient's GFR today is >60 3. Is SCr </= 2? Yes.   Patient's SCr is 1.17 mg/dL 4. Did SCr increase >/= 0.5 in 24 hours? No. 5.Pt's weight >40kg  Yes.   6. Abnormal electrolyte(s): K+ = 3.4  7. Electrolytes replaced per protocol 8.  Call MD STAT for K+ </= 2.5, Phos </= 1, or Mag </= 1 Physician:  Warrick Parisian, eMD  Suzan Slick Langford Carias 11/15/2023 4:16 AM

## 2023-11-15 NOTE — Progress Notes (Signed)
 STROKE TEAM PROGRESS NOTE   INTERIM HISTORY/SUBJECTIVE His girlfriend is at the bedside.  Patient is tolerating weaning   on the ventilator this morning. Patient remains awake and alert he is following commands on his left arm and leg and can move eyes up and down.  Withdraws left leg more than right no response on right arm    OBJECTIVE CBC    Component Value Date/Time   WBC 14.1 (H) 11/15/2023 0008   RBC 2.75 (L) 11/15/2023 0008   HGB 8.7 (L) 11/15/2023 0008   HCT 28.3 (L) 11/15/2023 0008   PLT 667 (H) 11/15/2023 0008   MCV 102.9 (H) 11/15/2023 0008   MCH 31.6 11/15/2023 0008   MCHC 30.7 11/15/2023 0008   RDW 13.8 11/15/2023 0008   LYMPHSABS 2.0 11/09/2023 0544   MONOABS 1.1 (H) 11/09/2023 0544   EOSABS 0.2 11/09/2023 0544   BASOSABS 0.0 11/09/2023 0544   BMET    Component Value Date/Time   NA 156 (H) 11/15/2023 0955   K 3.5 11/15/2023 0955   CL 121 (H) 11/15/2023 0955   CO2 21 (L) 11/15/2023 0955   GLUCOSE 109 (H) 11/15/2023 0955   BUN 30 (H) 11/15/2023 0955   CREATININE 1.21 11/15/2023 0955   CALCIUM 8.9 11/15/2023 0955   GFRNONAA >60 11/15/2023 0955   IMAGING past 24 hours DG Chest Port 1 View Result Date: 11/15/2023 CLINICAL DATA:  Respirator dependence. EXAM: PORTABLE CHEST 1 VIEW COMPARISON:  November 10, 2023. FINDINGS: Stable cardiomediastinal silhouette. Tracheostomy tube is in grossly good position. Nasogastric tube is seen entering stomach. Lungs are clear. Bony thorax is unremarkable. IMPRESSION: No active disease. Electronically Signed   By: Lupita Raider M.D.   On: 11/15/2023 13:59   DG Abd 1 View Result Date: 11/15/2023 CLINICAL DATA:  [id number] Emesis [id number] EXAM: ABDOMEN - 1 VIEW COMPARISON:  11/13/2023 FINDINGS: Enteric tube terminates within the stomach. Nonobstructive bowel gas pattern. Gaseous distension of the colon. No gross free intraperitoneal air on supine imaging. IMPRESSION: Nonobstructive bowel gas pattern. Gaseous distension of the colon.  Electronically Signed   By: Duanne Guess D.O.   On: 11/15/2023 11:09       Vitals:   11/15/23 1108 11/15/23 1154 11/15/23 1200 11/15/23 1300  BP:   131/88 121/79  Pulse:   81 85  Resp:   (!) 30 (!) 28  Temp:  100.2 F (37.9 C)    TempSrc:  Axillary    SpO2: 96%  98% 98%  Weight:      Height:       PHYSICAL EXAM General: Status post tracheostomy on ventilator in no acute distress CV: Regular rate and rhythm on monitor Respiratory: Mechanically ventilated  NEURO (off of sedation since 3/14):  Mental Status: Awake and, he is following commands this morning Cranial Nerves:  II: Pupils pinpoint, nonreactive III, IV, VI: Eyes a with slightly upward gaze, with ability to do look up and down in both eyes right greater than left.  No horizontal movement in the eyes V: Absent bilaterally VII: Face appears symmetrical resting with ET tube present IX, X: Cough and gag intact XI: Head is midline XII: ETT in place  Motor/Sensory: Able to bend his fingers and grip as well as extend elbow in the left hand to command RUE/RLE: No response LUE: Can move hand at wrist and left and fingers LLE: Can wiggle toes  Coordination: Deferred Gait: Deferred  ASSESSMENT/PLAN  [name] is a 48 y.o. male with history of  HTN admitted for ICH.    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 Basal cisterns and fourth ventricle are effaced without hydrocephalus at this time.  CTA head & neck no LVO CT repeat 3/9 no substantial change  Repeat Ct head 3/18 Interval decrease in size of subacute pontine hemorrhage, Decreased regional mass effect, Interval decrease in scattered small volume subarachnoid  and intraventricular hemorrhage. 2D Echo EF 60 to 65%, moderate concentric LVH, grade 1 diastolic dysfunction, normal left atrial size, no atrial level shunt LDL 96 HgbA1c 4.7 UDS positive for opiates VTE prophylaxis - continue SQ lovenox No antithrombotic prior to admission, continue No antithrombotic secondary to ICH Therapy recommendations:  Pending Disposition:  pending placement with trach   Brainstem compression Continued poor neurological exam 3% hypertonic off Continue FW 200 Q4 Na 154> 159 >157 >158->151->149->147->144->146-146  Hypertension Home meds:  None Stable Continue Norvasc, hydralazine, losartan, Lopressor Clonidine D/Cd in case of possible side effect of reduced sympathetic CNS nerve responses affecting neurological exam.  PRN labetalol  BP goal less than 160 Long-term BP goal normotensive  Hypoxic respiratory failure Aspiration PNA Intubated in the ED PCCM following, appreciate assitance SVT/VAP per CCM CXR Streaky left basilar opacity  Tmax 101.5->100.4->102.1->100.9->100.6->100.1->101 WBC 20.4-> 9.8 -> 13.2-> 18.1-14.1--15.5->18.3->13.2-13.3-19.9 Unasyn completed Trach placed 3/22  Dysphagia PEG tube placed 3/24 Continue tube feeding and free water 200 cc Q4h  Substance use UDS positive for opioids Narcan given in ED  Other Active Problems Hypokalemia: K 2.6 -> 4.2-> 3.3-> 3.4->4.1-> 3.5-> 3.8 -> 4.1->3.7 AKI, Cr 2.2-> 1.54 > 1.38> 1.37-1.71-1.56->1.16->1.75->1.26->1.28 Hypermagnesia: 3.0->2.9->3.0->3.2 Hyperphosphatemia: 4.9-4.8-3.6  Hospital day # 19   Patient continues to require ventilatory support despite tracheostomy and will try to wean as tolerated.  His neurological exam is slightly improved with patient following commands with vertical gaze as well as moving fingers and left hand and left foot. Patient will likely need long-term ventilatory support for respiratory failure but options for this are limited.  Discussed with his  girlfriend at the bedside.  Discussed with Dr. Everardo All critical care team.   This patient is critically ill and at significant risk of neurological worsening, death and care requires constant monitoring of vital signs, hemodynamics,respiratory and cardiac monitoring, extensive review of multiple databases, frequent neurological assessment, discussion with family, other specialists and medical decision making of high complexity.I have made any additions or clarifications directly to the above note.This critical care time does not reflect procedure time, or teaching time or supervisory time of PA/NP/Med Resident etc but could involve care discussion time.  I spent 30 minutes of neurocritical care time  in the care of  this patient.         Delia Heady, MD Medical Director [facility] Stroke Center Pager: [phone] 11/15/2023 2:09 PM

## 2023-11-15 NOTE — Progress Notes (Signed)
 Nutrition Follow-up  DOCUMENTATION CODES:   Not applicable  INTERVENTION:  Once ileus resolved, recommend re-starting tube feeds at rate below:  Tube feeding via PEG: Osmolite 1.5 to 50 ml/h (1200 ml per day)  Prosource TF20 60 ml TID  Provides 2160 kcal, 123 gm protein, 1003 ml free water daily  200 ml free water every 4 hours Total free water: 2112 ml   Continue to monitor bowel movements and plan for treatment  NUTRITION DIAGNOSIS:  Inadequate oral intake related to inability to eat as evidenced by NPO status. Ongoing  GOAL:  Patient will meet greater than or equal to 90% of their needs Currently not meeting, tube feeds paused  MONITOR:  TF tolerance, I & O's  REASON FOR ASSESSMENT:  Consult Enteral/tube feeding initiation and management  ASSESSMENT:  Pt with PMH of HTN, daily mariajuana, and smoker admitted after being found down at home with pontine hemorrhagic stroke and trace SAH. UDS positive for opioids. Noted aspiration PNA on admission.  3/8 - admitted with [facility], intubated 3/9 - Adult TF protocol started 3/14 - s/p cortrak placement, tip gastric  3/22- tracheostomy at bedside 3/24- PEG placement 3/25 diuresed removed 2.2L, Net -1.8L 3/26- emesis, TF paused  RN reports emesis with no bowel movement 3/26. Bowel regimen increased. Pt was tolerating feeds prior to emesis episodes (total 1L output). RN reports pt has small bowel ileus per xray which is suspected cause of emesis so tube feeds have been paused. Will continue to monitor bowel regimen and if pt successfully passes a bowel movement before re-starting tube feeds.   Checked in on pt whose fiance was at bedside. Discussed pausing tube feeds until emesis decreases and discussed continued monitoring. No questions at this time from fiance.   Medications reviewed and include:  Colace Pepcid Miralax Reglan  Potassium chloride Senna Hypertonic 3% sodium chloride Vancomycin   Labs reviewed:  BUN/Cr  improved 34/1.17<---67/1.75  Potassium 3.4 Chloride 121, Sodium 156 (on hypertonic solution) Magnesium 2.5 A1C: 4.7 CBG's: 92-113   UOP: 1700 ml Emesis:  Diet Order:   Diet Order             Diet NPO time specified  Diet effective now                  EDUCATION NEEDS:   No education needs have been identified at this time  Skin:  Skin Assessment: Reviewed RN Assessment  Last BM:  3/25  Height:  Ht Readings from Last 1 Encounters:  11/05/23 5' 7.99" (1.727 m)   Weight:  Wt Readings from Last 1 Encounters:  11/15/23 102 kg   Ideal Body Weight:  70 kg  BMI:  Body mass index is 34.2 kg/m.  Estimated Nutritional Needs:   Kcal:  2000-2200  Protein:  125-140 grams  Fluid:  >2 L/day  Louis Meckel Dietetic Intern

## 2023-11-15 NOTE — Progress Notes (Addendum)
 Still weaning on PSV Looks comfortable.  No change from earlier  Plan Cont rx as outlined May be able to try ATC tomorrow Last BM 25th will try sse

## 2023-11-15 NOTE — Progress Notes (Signed)
  Inpatient Rehab Admissions Coordinator :  Per therapy recommendations patient was screened for CIR candidacy by Ottie Glazier RN MSN. Patient is not at a level to tolerate the intensity required to pursue a CIR admit . The CIR admissions team will follow and monitor for progress and place a Rehab Consult order if felt to be appropriate. Please contact me with any questions.  Ottie Glazier RN MSN Admissions Coordinator [phone]

## 2023-11-16 DIAGNOSIS — J9601 Acute respiratory failure with hypoxia: Secondary | ICD-10-CM | POA: Diagnosis not present

## 2023-11-16 DIAGNOSIS — R1310 Dysphagia, unspecified: Secondary | ICD-10-CM

## 2023-11-16 DIAGNOSIS — K567 Ileus, unspecified: Secondary | ICD-10-CM

## 2023-11-16 DIAGNOSIS — D649 Anemia, unspecified: Secondary | ICD-10-CM

## 2023-11-16 DIAGNOSIS — E876 Hypokalemia: Secondary | ICD-10-CM

## 2023-11-16 DIAGNOSIS — I161 Hypertensive emergency: Secondary | ICD-10-CM

## 2023-11-16 DIAGNOSIS — E878 Other disorders of electrolyte and fluid balance, not elsewhere classified: Secondary | ICD-10-CM

## 2023-11-16 DIAGNOSIS — I613 Nontraumatic intracerebral hemorrhage in brain stem: Secondary | ICD-10-CM | POA: Diagnosis not present

## 2023-11-16 DIAGNOSIS — E870 Hyperosmolality and hypernatremia: Secondary | ICD-10-CM

## 2023-11-16 LAB — BASIC METABOLIC PANEL WITH GFR
Anion gap: 11 (ref 5–15)
Anion gap: 9 (ref 5–15)
Anion gap: 9 (ref 5–15)
BUN: 28 mg/dL — ABNORMAL HIGH (ref 6–20)
BUN: 26 mg/dL — ABNORMAL HIGH (ref 6–20)
BUN: 22 mg/dL — ABNORMAL HIGH (ref 6–20)
CO2: 25 mmol/L (ref 22–32)
CO2: 23 mmol/L (ref 22–32)
CO2: 23 mmol/L (ref 22–32)
Calcium: 8.8 mg/dL — ABNORMAL LOW (ref 8.9–10.3)
Calcium: 8.4 mg/dL — ABNORMAL LOW (ref 8.9–10.3)
Calcium: 8.5 mg/dL — ABNORMAL LOW (ref 8.9–10.3)
Chloride: 115 mmol/L — ABNORMAL HIGH (ref 98–111)
Chloride: 115 mmol/L — ABNORMAL HIGH (ref 98–111)
Chloride: 113 mmol/L — ABNORMAL HIGH (ref 98–111)
Creatinine, Ser: 1.19 mg/dL (ref 0.61–1.24)
Creatinine, Ser: 1.1 mg/dL (ref 0.61–1.24)
Creatinine, Ser: 1.01 mg/dL (ref 0.61–1.24)
GFR, Estimated: >60 mL/min (ref >60)
GFR, Estimated: >60 mL/min (ref >60)
GFR, Estimated: >60 mL/min (ref >60)
Glucose, Bld: 119 mg/dL — ABNORMAL HIGH (ref 70–99)
Glucose, Bld: 125 mg/dL — ABNORMAL HIGH (ref 70–99)
Glucose, Bld: 122 mg/dL — ABNORMAL HIGH (ref 70–99)
Potassium: 3 mmol/L — ABNORMAL LOW (ref 3.5–5.1)
Potassium: 3.2 mmol/L — ABNORMAL LOW (ref 3.5–5.1)
Potassium: 3.2 mmol/L — ABNORMAL LOW (ref 3.5–5.1)
Sodium: 151 mmol/L — ABNORMAL HIGH (ref 135–145)
Sodium: 147 mmol/L — ABNORMAL HIGH (ref 135–145)
Sodium: 145 mmol/L (ref 135–145)

## 2023-11-16 LAB — MAGNESIUM: Magnesium: 2.2 mg/dL (ref 1.7–2.4)

## 2023-11-16 LAB — GLUCOSE, CAPILLARY
Glucose-Capillary: 104 mg/dL — ABNORMAL HIGH (ref 70–99)
Glucose-Capillary: 105 mg/dL — ABNORMAL HIGH (ref 70–99)
Glucose-Capillary: 107 mg/dL — ABNORMAL HIGH (ref 70–99)
Glucose-Capillary: 105 mg/dL — ABNORMAL HIGH (ref 70–99)
Glucose-Capillary: 99 mg/dL (ref 70–99)
Glucose-Capillary: 105 mg/dL — ABNORMAL HIGH (ref 70–99)

## 2023-11-16 MED ORDER — POTASSIUM CHLORIDE 20 MEQ PO PACK
20.0000 meq | PACK | ORAL | Status: AC
  Administered 2023-11-16 (×2): 20 meq
  Filled 2023-11-16 (×2): qty 1

## 2023-11-16 MED ORDER — POTASSIUM CHLORIDE 10 MEQ/100ML IV SOLN
10.0000 meq | INTRAVENOUS | Status: AC
  Administered 2023-11-16 (×4): 10 meq via INTRAVENOUS
  Filled 2023-11-16 (×4): qty 100

## 2023-11-16 MED ORDER — POTASSIUM CHLORIDE 20 MEQ PO PACK
40.0000 meq | PACK | ORAL | Status: AC
  Administered 2023-11-16 – 2023-11-17 (×2): 40 meq
  Filled 2023-11-16 (×2): qty 2

## 2023-11-16 NOTE — Progress Notes (Signed)
 [facility] ADULT ICU REPLACEMENT PROTOCOL   The patient does apply for the [facility] Adult ICU Electrolyte Replacment Protocol based on the criteria listed below:   1.Exclusion criteria: TCTS, ECMO, Dialysis, and Myasthenia Gravis patients 2. Is GFR >/= 30 ml/min? Yes.    Patient's GFR today is >60 3. Is SCr </= 2? Yes.   Patient's SCr is 1.19 mg/dL 4. Did SCr increase >/= 0.5 in 24 hours? No. 5.Pt's weight >40kg  Yes.   6. Abnormal electrolyte(s): K 3.0  7. Electrolytes replaced per protocol 8.  Call MD STAT for K+ </= 2.5, Phos </= 1, or Mag </= 1 Physician:    Markus Daft A 11/16/2023 6:08 AM

## 2023-11-16 NOTE — Progress Notes (Addendum)
 STROKE TEAM PROGRESS NOTE   INTERIM HISTORY/SUBJECTIVE  No family at bedside.  RN at bedside. Patient is awake, was able to follow a few simple commands.  He is able to move his eyes up and down.  However he does have posturing movements of his left upper extremity.  Plan is for trach collar today and to restart tube feeds back at trickle.  Continue no response on right arm.  Discharge pending placement with trach and ventilatory requirement.  Plan is for trach collar today.  He had a large bowel movement after getting a enema   OBJECTIVE CBC    Component Value Date/Time   WBC 14.1 (H) 11/15/2023 0008   RBC 2.75 (L) 11/15/2023 0008   HGB 8.7 (L) 11/15/2023 0008   HCT 28.3 (L) 11/15/2023 0008   PLT 667 (H) 11/15/2023 0008   MCV 102.9 (H) 11/15/2023 0008   MCH 31.6 11/15/2023 0008   MCHC 30.7 11/15/2023 0008   RDW 13.8 11/15/2023 0008   LYMPHSABS 2.0 11/09/2023 0544   MONOABS 1.1 (H) 11/09/2023 0544   EOSABS 0.2 11/09/2023 0544   BASOSABS 0.0 11/09/2023 0544   BMET    Component Value Date/Time   NA 147 (H) 11/16/2023 0853   K 3.2 (L) 11/16/2023 0853   CL 115 (H) 11/16/2023 0853   CO2 23 11/16/2023 0853   GLUCOSE 125 (H) 11/16/2023 0853   BUN 26 (H) 11/16/2023 0853   CREATININE 1.10 11/16/2023 0853   CALCIUM 8.4 (L) 11/16/2023 0853   GFRNONAA >60 11/16/2023 0853   IMAGING past 24 hours No results found.      Vitals:   11/16/23 1100 11/16/23 1109 11/16/23 1153 11/16/23 1200  BP: (!) 140/85   (!) 154/103  Pulse: 89 76  100  Resp: 14 17  (!) 30  Temp:   99.6 F (37.6 C)   TempSrc:   Axillary   SpO2: 98% 99%  100%  Weight:      Height:       PHYSICAL EXAM General: Post tracheostomy and PEG placement almost a week ago.  NAD. CV: Regular rate and rhythm on monitor Respiratory: Mechanically ventilated, on trach.  Plan for trach collar today  NEURO (off of sedation since 3/14):  Mental Status: Awake, following simple commands.  Cranial Nerves:  II: Pupils  pinpoint, nonreactive III, IV, VI: Eyes with slightly upward gaze, with ability to do look up and down in both eyes right greater than left.  No horizontal movement in the eyes V: Absent bilaterally ZOX:WRUEAV movement symmetric.  IX, X: Cough and gag intact XI: Head is midline  Motor/Sensory:  RUE/RLE: No response LUE: Can move hand at wrist and left and fingers. Able to bend his fingers and grip to command LLE: Can wiggle toes  Coordination: Deferred Gait: Deferred  ASSESSMENT/PLAN  [name] is a 48 y.o. male with history of HTN admitted for ICH.    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 Basal cisterns and fourth ventricle are effaced without hydrocephalus at this time.  CTA head & neck no LVO CT repeat 3/9 no substantial change  Repeat Ct head 3/18 Interval decrease in size of subacute pontine hemorrhage, Decreased regional mass effect, Interval decrease in scattered small volume subarachnoid and  intraventricular hemorrhage. 2D Echo EF 60 to 65%, moderate concentric LVH, grade 1 diastolic dysfunction, normal left atrial size, no atrial level shunt LDL 96 HgbA1c 4.7 UDS positive for opiates VTE prophylaxis - continue SQ lovenox No antithrombotic prior to admission, continue No antithrombotic secondary to ICH Therapy recommendations:  Pending Disposition:  pending placement with trach   Brainstem compression Continued poor neurological exam 3% hypertonic off Continue FW 200 Q4 Na 154> 159 >157 >158->151->149->147->144->146-146->147  Hypertension Home meds:  None Stable Continue Norvasc, hydralazine, losartan, Lopressor Clonidine D/Cd in case of possible side effect  of reduced sympathetic CNS nerve responses affecting neurological exam.  PRN labetalol  BP goal less than 160 Long-term BP goal normotensive  Hypoxic respiratory failure Aspiration PNA Intubated in the ED PCCM following, appreciate assitance Trach placed 3/22 Plan for trach collar today CXR Streaky left basilar opacity  Tmax 101.5->100.4->102.1->100.9->100.6->100.1->101 WBC 20.4-> 9.8 -> 13.2-> 18.1-14.1--15.5->18.3->13.2-13.3-19.9->17.0->14.1 Unasyn completed Low-grade fever  Dysphagia PEG tube placed 3/24 Restart tube feeding at trickle Continue free water 200 cc Q4h  Substance use UDS positive for opioids Narcan given in ED  Other Active Problems Hypokalemia: K 2.6 -> 4.2-> 3.3-> 3.4->4.1-> 3.5-> 3.8 -> 4.1->3.7->3.2 Replenish, continue to monitor, daily checks AKI, Cr 2.2-> 1.54 > 1.38> 1.37-1.71-1.56->1.16->1.75->1.26->1.28->1.10 Hypermagnesia: 3.0->2.9->3.0->3.2 Hyperphosphatemia: 4.9-4.8-3.6  Hospital day # 20   Pt seen by Neuro NP/APP and later by MD. Note/plan to be edited by MD as needed.    Garrett January, DNP, AGACNP-BC [facility] Please use AMION for contact information & EPIC for messaging.  I have personally obtained history,examined this patient, reviewed notes, independently viewed imaging studies, participated in medical decision making and plan of care.ROS completed by me personally and pertinent positives fully documented  I have made any additions or clarifications directly to the above note. Agree with note above.  Patient is showing slow neurological improvement but remains significantly quadriplegic and still on ventilatory support for respiratory failure and trouble tolerating PEG tube feedings due to partial ileus.  Continue weaning of ventilatory support to trach collar and ongoing medical care as per critical care team.  Will ask CCM team to resume primary care if agreeable.  Discussed with Dr. Everardo All. This patient is critically ill  and at significant risk of neurological worsening, death and care requires constant monitoring of vital signs, hemodynamics,respiratory and cardiac monitoring, extensive review of multiple databases, frequent neurological assessment, discussion with family, other specialists and medical decision making of high complexity.I have made any additions or clarifications directly to the above note.This critical care time does not reflect procedure time, or teaching time or supervisory time of PA/NP/Med Resident etc but could involve care discussion time.  I spent 30 minutes of neurocritical care time  in the care of  this patient.     Delia Heady, MD Medical Director [facility] Pager: [phone] 11/16/2023 3:36 PM

## 2023-11-16 NOTE — Evaluation (Addendum)
 Occupational Therapy Evaluation Patient Details Name: Garrett [name] MRN: [id number] DOB: [birth year] Today's Date: 11/16/2023   History of Present Illness   Pt is a 48 yo male who was found down 10/27/23 with agonal breathing. 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 Intubated 3/8, cortrak placed 3/14, trach placed 3/22, PEG placed 3/24. PMH: HTN, smoker     Clinical Impressions Pt admitted for above and presents with problem list below.  Patient opening R eye, L eye swollen and red (unable to close on his own); gaze vertically but non purposeful.  He opens R eye to noise, sustains throughout most of session.  PROM of BUEs appears WFL but no functional movement or non purposeful movement, posturing seen with BUEs. Total care for ADLs, total +2 for bed mobility.  Preference to L head turn, placed towel to L head to promote midline positioning. VSS on vent via trach. Will monitor for splinting needs.  Recommend <3 hours inpatient setting with increased medical support, will follow acutely.      If plan is discharge home, recommend the following:   Two people to help with walking and/or transfers;Two people to help with bathing/dressing/bathroom;Other (comment) (total care)     Functional Status Assessment   Patient has had a recent decline in their functional status and demonstrates the ability to make significant improvements in function in a reasonable and predictable amount of time.     Equipment Recommendations   Other (comment) (defer)     Recommendations for Other Services         Precautions/Restrictions   Precautions Precautions: Fall;Other (comment) Recall of Precautions/Restrictions: Impaired Precaution/Restrictions Comments: trach on vent, PEG, abdominal binder, SBP < 160 Restrictions Weight  Bearing Restrictions Per Provider Order: No     Mobility Bed Mobility               General bed mobility comments: +2 total assist to scoot up in bed and bed to chair    Transfers                   General transfer comment: deferred      Balance                                           ADL either performed or assessed with clinical judgement   ADL Overall ADL's : Needs assistance/impaired                                       General ADL Comments: total care     Vision   Vision Assessment?: Vision impaired- to be further tested in functional context Additional Comments: L eye swollen and red, unable to close on his own.  RN reports taping eye closed throughout his stay.  Patient not blinking to threat or turning to sound on L or R.     Perception         Praxis         Pertinent Vitals/Pain Pain Assessment Pain Assessment: PAINAD Facial Expression: Relaxed, neutral Body Movements: Absence of movements Muscle Tension: Relaxed Compliance with ventilator (intubated pts.): Tolerating ventilator or movement Vocalization (extubated pts.): N/A  CPOT Total: 0 Pain Intervention(s): Monitored during session     Extremity/Trunk Assessment Upper Extremity Assessment Upper Extremity Assessment: RUE deficits/detail;LUE deficits/detail RUE Deficits / Details: PROM appears WFL, posturing at times.  Responding to noxious stimuli. No active or non purposeful movement noted. RUE Sensation: decreased light touch;decreased proprioception RUE Coordination: decreased fine motor;decreased gross motor LUE Deficits / Details: PROM appears WFL, posturing at times. Responding to noxious stimuli. No active or non purposeful movement noted. LUE Sensation: decreased light touch;decreased proprioception LUE Coordination: decreased fine motor;decreased gross motor   Lower Extremity Assessment Lower Extremity Assessment: Defer to PT  evaluation   Cervical / Trunk Assessment Cervical / Trunk Assessment:  (L head turn preference)   Communication Communication Communication: Impaired Factors Affecting Communication: Trach/intubated   Cognition Arousal: Lethargic Behavior During Therapy: Flat affect Cognition: Difficult to assess Difficult to assess due to: Intubated, Tracheostomy           OT - Cognition Comments: intubated via trach, pt with no verbal responses. unable to follow commands                 Following commands: Impaired Following commands impaired:  (did not follow commands)     Cueing  General Comments   Cueing Techniques: Verbal cues;Gestural cues;Tactile cues;Visual cues  VSS trach on vent PSV 30% FiO2 PEEP 5   Exercises     Shoulder Instructions      Home Living Family/patient expects to be discharged to:: Unsure Living Arrangements: Spouse/significant other                               Additional Comments: per chart, it appears pt lived with a significant other      Prior Functioning/Environment Prior Level of Function : Independent/Modified Independent;Driving;Patient poor historian/Family not available             Mobility Comments: Unsure as pt is unable to communicate and no family is currently present, but per chart pt had drove his fiance to work the day he was admitted, and based on age he was likely independent PTA ADLs Comments: friend present and reports "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"    OT Problem List: Decreased strength;Decreased range of motion;Decreased activity tolerance;Impaired balance (sitting and/or standing);Impaired vision/perception;Decreased coordination;Decreased cognition;Decreased safety awareness;Decreased knowledge of use of DME or AE;Decreased knowledge of precautions;Cardiopulmonary status limiting activity;Impaired sensation;Obesity;Impaired tone;Impaired UE functional use;Pain;Increased edema   OT Treatment/Interventions:  Self-care/ADL training;Therapeutic exercise;DME and/or AE instruction;Therapeutic activities;Cognitive remediation/compensation;Visual/perceptual remediation/compensation;Patient/family education;Balance training;Neuromuscular education;Splinting      OT Goals(Current goals can be found in the care plan section)   Acute Rehab OT Goals Patient Stated Goal: unable OT Goal Formulation: Patient unable to participate in goal setting Time For Goal Achievement: 11/30/23 Potential to Achieve Goals: Fair   OT Frequency:  Min 2X/week    Co-evaluation              AM-PAC OT "6 Clicks" Daily Activity     Outcome Measure Help from another person eating meals?: Total Help from another person taking care of personal grooming?: Total Help from another person toileting, which includes using toliet, bedpan, or urinal?: Total Help from another person bathing (including washing, rinsing, drying)?: Total Help from another person to put on and taking off regular upper body clothing?: Total Help from another person to put on and taking off regular lower body clothing?: Total 6 Click Score: 6   End of  Session Equipment Utilized During Treatment: Oxygen (via trach) Nurse Communication: Mobility status  Activity Tolerance: Patient limited by lethargy Patient left: in bed;with call bell/phone within reach;with bed alarm set;with family/visitor present  OT Visit Diagnosis: Other abnormalities of gait and mobility (R26.89);Muscle weakness (generalized) (M62.81);Other symptoms and signs involving the nervous system (R29.898);Other symptoms and signs involving cognitive function;Hemiplegia and hemiparesis Hemiplegia - caused by: Nontraumatic SAH                Time: 1610-9604 OT Time Calculation (min): 16 min Charges:  OT General Charges $OT Visit: 1 Visit OT Evaluation $OT Eval High Complexity: 1 High  Barry Brunner, OT Acute Rehabilitation Services Office [phone] Secure Chat Preferred     [name] 11/16/2023, 2:10 PM

## 2023-11-16 NOTE — Progress Notes (Signed)
 SLP Cancellation Note  Patient Details Name: Garrett [name] MRN: [id number] DOB: August 19, 1976   Cancelled treatment:  Pt remains on vent. Please Secure Chat  MC/WL SLP (group chat) if he transitions to ATC.  Sabastian Raimondi L. Samson Frederic, MA CCC/SLP Clinical Specialist - Acute Care SLP Acute Rehabilitation Services Office number [phone]          Blenda Mounts [name] 11/16/2023, 8:16 AM

## 2023-11-16 NOTE — Progress Notes (Signed)
 eLink Physician-Brief Progress Note Patient Name: Garrett [name] DOB: 05-01-1976 MRN: [id number]   Date of Service  11/16/2023  HPI/Events of Note  K+ 3.2  eICU Interventions  KCL 40 meq via NG tube Q 4 hours x 2 doses.        Migdalia Dk 11/16/2023, 8:33 PM

## 2023-11-16 NOTE — Progress Notes (Signed)
 NAME:  Garrett [name], MRN:  [id number], DOB:  1976/04/22, LOS: 20 ADMISSION DATE:  10/27/2023, CONSULTATION DATE:  10/27/2023 REFERRING MD:  Coralee Pesa, DO CHIEF COMPLAINT:  Found Down  History of Present Illness:   48 y/o male with h/o HTN who was found down for unknown downtime by his [place] when she came home from work, he was having agonal breathing.  He apparently had driven her to work this morning.  He has HTN but never checks his BP and does not follow with MD.  She says you can tell when his BP is really high; his eyes get blood shot and he is sweating.  No h/o seizures.  Besides almost daily [name] and cigarette smoking, she is not aware of any other drugs. Drug screen positive for opioids and he was given several doses of Narcan. Patient found to have:  1. 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 Basal cisterns and fourth ventricle are effaced without hydrocephalus at this time. 2. Trace additional subarachnoid hemorrhage along the left parietal convexity. BP in ED 262/156. His cxr revealed a RUL and perihilar infiltrates suggesting aspiration pneumonia. ED labs also revealed PH 7.169, LA 4.4, CPK 985. Patient was intubated in ED.  Pertinent  Medical History  HTN  Significant Hospital Events: Including procedures, antibiotic start and stop dates in addition to other pertinent events   3/8: Intubated in ED, pontine bleed/SAH. CT head 17:09 Acute large hemorrhage 4.1 cm in pons with intraventricular extension without hydrocephalus 3/9: CTA 03:14 AM No change in IPH in pons, similar biparietal [facility] 3/14: Now awake, appears locked in can follow commands with eyes. 3/16 Purposeful with left upper extremity  3/22 tracheostomy placed at bedside, bleeding issues overnight from trach site 3/24 PEG (Dr. Janee Morn) 3/24 sputum culture with MRSA 3/27 vomited. TF  on hold. Abd film c/w ileus. Added reglan, got SSE. Did tolerate PSV all day  3/28 BMs x2 after SSE day prior. Added back TFs at 1/2 rated.   Interim History / Subjective:  No distress  Objective   Blood pressure (!) 136/94, pulse 87, temperature 99.8 F (37.7 C), temperature source Axillary, resp. rate (!) 28, height 5' 7.99" (1.727 m), weight 102 kg, SpO2 100%.    Vent Mode: PRVC FiO2 (%):  [30 %-40 %] 40 % Set Rate:  [15 bmp-18 bmp] 15 bmp Vt Set:  [550 mL] 550 mL PEEP:  [5 cmH20] 5 cmH20 Pressure Support:  [10 cmH20-12 cmH20] 10 cmH20 Plateau Pressure:  [18 cmH20-27 cmH20] 18 cmH20   Intake/Output Summary (Last 24 hours) at 11/16/2023 0733 Last data filed at 11/16/2023 0700 Gross per 24 hour  Intake 3264.45 ml  Output 2575 ml  Net 689.45 ml   Filed Weights   11/14/23 0500 11/15/23 0500 11/16/23 0500  Weight: 102 kg 102 kg 102 kg   Physical Exam: General 48 year old male. He is laying in bed. Currently on PSV of 12 and appears comfortable  HENT trach is midline. Has NGT in place. This was to [facility] will clamp Left eye injected w/ scleral edema  Pulm scattered rhonchi. Good VT on PSV 12 >400 ml and no accessory use  Card rrr Abd softer + bowel sounds last BM this am  Gu cl yellow Neuro will follow commands w/ left "thumbs up, wiggles toes", right sided hemiparesis. Not able to close left eye   Resolved problem list  AKI   Assessment & Plan:   Pontine Hemorrhagic stroke with trace SAH secondary to Hypertensive Emergency.  Plan Minimizing sedating meds Supportive care Bp goal now normalized; aiming for closer to 140 Cont norvasc 10mg /d, lopressor 100mg  bid, apresoline 100mg  q8  Nausea/vomiting/ileus-->improved s/p SSE 3/27 Plan Cont reglan Ensure K >4 and Mg >2 Tubefeeds to resume at 1/2 rate Clamp NGT  Dysphagia Plan Has PEG TF as above   Acute hypoxic respiratory failure due to aspiration pneumonia - s/p tracheostomy placement 3/22. MRSA PNA - sputum  culture 3/24. CXR improved  Mental status and ileus barriers to ATC today  Plan Attempt ATC today, w/ plan to cont PSV  as tolerated and mandatory HS ventilation for now  Cont routine trach care  Can remove trach sutures 3/29 Day 5 of 7 vanc for MRSA PNA  continue guaifenesin per tube, Hypertonic nebs, Chest PT. Will need LTAC referral - TOC consult placed.   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 Had to back down FWVT given vomiting Plan Cont current D5W at 100 cc'hr Cont FWVT q 6 Repeat q 12 chems for now Hold further diuretics until have better handle on water balance  Replace K (need to be aggressive about this w/ ileus) Stop lasix (got added back again 3/27)  Anemia. Plan Trend cbc Transfuse for hgb < 7  Poor Dentition. -  A couple upper right teeth and one left lower tooth have chipped off. plan Will need dental evaluation at some point.    Best Practice (right click and "Reselect all SmartList Selections" daily)   Diet/type: tubefeeds DVT prophylaxis LMWH Pressure ulcer(s): N/A GI prophylaxis: H2B Lines: N/A Foley:  external catheter.  Code Status:  DNR with pre-arrest interventions     My cct 32 min

## 2023-11-17 ENCOUNTER — Inpatient Hospital Stay (HOSPITAL_COMMUNITY): Payer: MEDICAID

## 2023-11-17 DIAGNOSIS — K567 Ileus, unspecified: Secondary | ICD-10-CM | POA: Diagnosis not present

## 2023-11-17 DIAGNOSIS — G935 Compression of brain: Secondary | ICD-10-CM

## 2023-11-17 DIAGNOSIS — I613 Nontraumatic intracerebral hemorrhage in brain stem: Secondary | ICD-10-CM | POA: Diagnosis not present

## 2023-11-17 DIAGNOSIS — J690 Pneumonitis due to inhalation of food and vomit: Secondary | ICD-10-CM | POA: Diagnosis not present

## 2023-11-17 DIAGNOSIS — J9691 Respiratory failure, unspecified with hypoxia: Secondary | ICD-10-CM

## 2023-11-17 DIAGNOSIS — G8250 Quadriplegia, unspecified: Secondary | ICD-10-CM | POA: Diagnosis not present

## 2023-11-17 DIAGNOSIS — I161 Hypertensive emergency: Secondary | ICD-10-CM | POA: Diagnosis not present

## 2023-11-17 DIAGNOSIS — J9601 Acute respiratory failure with hypoxia: Secondary | ICD-10-CM | POA: Diagnosis not present

## 2023-11-17 DIAGNOSIS — Z931 Gastrostomy status: Secondary | ICD-10-CM

## 2023-11-17 DIAGNOSIS — F1190 Opioid use, unspecified, uncomplicated: Secondary | ICD-10-CM

## 2023-11-17 DIAGNOSIS — A4902 Methicillin resistant Staphylococcus aureus infection, unspecified site: Secondary | ICD-10-CM

## 2023-11-17 LAB — BASIC METABOLIC PANEL WITH GFR
Anion gap: 9 (ref 5–15)
BUN: 25 mg/dL — ABNORMAL HIGH (ref 6–20)
CO2: 21 mmol/L — ABNORMAL LOW (ref 22–32)
Calcium: 8.8 mg/dL — ABNORMAL LOW (ref 8.9–10.3)
Chloride: 113 mmol/L — ABNORMAL HIGH (ref 98–111)
Creatinine, Ser: 1.12 mg/dL (ref 0.61–1.24)
GFR, Estimated: >60 mL/min (ref >60)
Glucose, Bld: 124 mg/dL — ABNORMAL HIGH (ref 70–99)
Potassium: 3.6 mmol/L (ref 3.5–5.1)
Sodium: 143 mmol/L (ref 135–145)

## 2023-11-17 LAB — CBC
HCT: 25.6 % — ABNORMAL LOW (ref 39.0–52.0)
Hemoglobin: 7.8 g/dL — ABNORMAL LOW (ref 13.0–17.0)
MCH: 31.1 pg (ref 26.0–34.0)
MCHC: 30.5 g/dL (ref 30.0–36.0)
MCV: 102 fL — ABNORMAL HIGH (ref 80.0–100.0)
Platelets: 720 10*3/uL — ABNORMAL HIGH (ref 150–400)
RBC: 2.51 MIL/uL — ABNORMAL LOW (ref 4.22–5.81)
RDW: 13.9 % (ref 11.5–15.5)
WBC: 17.6 10*3/uL — ABNORMAL HIGH (ref 4.0–10.5)
nRBC: 0.2 % (ref 0.0–0.2)

## 2023-11-17 LAB — GLUCOSE, CAPILLARY
Glucose-Capillary: 86 mg/dL (ref 70–99)
Glucose-Capillary: 108 mg/dL — ABNORMAL HIGH (ref 70–99)
Glucose-Capillary: 104 mg/dL — ABNORMAL HIGH (ref 70–99)
Glucose-Capillary: 92 mg/dL (ref 70–99)
Glucose-Capillary: 94 mg/dL (ref 70–99)
Glucose-Capillary: 99 mg/dL (ref 70–99)

## 2023-11-17 MED ORDER — NOREPINEPHRINE 4 MG/250ML-% IV SOLN
INTRAVENOUS | Status: AC
Start: 2023-11-17 — End: 2023-11-17
  Filled 2023-11-17: qty 250

## 2023-11-17 NOTE — Progress Notes (Signed)
 Patient transported to CT and returned to 4N21 on mechanical ventilator without any adverse reactions.

## 2023-11-17 NOTE — Progress Notes (Signed)
 NAME:  Garrett [name], MRN:  [id number], DOB:  10/22/1975, LOS: 21 ADMISSION DATE:  10/27/2023, CONSULTATION DATE:  10/27/2023 REFERRING MD:  Coralee Pesa, DO CHIEF COMPLAINT:  Found Down  History of Present Illness:   48 y/o male with h/o HTN who was found down for unknown downtime by his [place] when she came home from work, he was having agonal breathing.  He apparently had driven her to work this morning.  He has HTN but never checks his BP and does not follow with MD.  She says you can tell when his BP is really high; his eyes get blood shot and he is sweating.  No h/o seizures.  Besides almost daily [name] and cigarette smoking, she is not aware of any other drugs. Drug screen positive for opioids and he was given several doses of Narcan. Patient found to have:  1. 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 Basal cisterns and fourth ventricle are effaced without hydrocephalus at this time. 2. Trace additional subarachnoid hemorrhage along the left parietal convexity. BP in ED 262/156. His cxr revealed a RUL and perihilar infiltrates suggesting aspiration pneumonia. ED labs also revealed PH 7.169, LA 4.4, CPK 985. Patient was intubated in ED.  Pertinent  Medical History  HTN  Significant Hospital Events: Including procedures, antibiotic start and stop dates in addition to other pertinent events   3/8: Intubated in ED, pontine bleed/SAH. CT head 17:09 Acute large hemorrhage 4.1 cm in pons with intraventricular extension without hydrocephalus 3/9: CTA 03:14 AM No change in IPH in pons, similar biparietal [facility] 3/14: Now awake, appears locked in can follow commands with eyes. 3/16 Purposeful with left upper extremity  3/22 tracheostomy placed at bedside, bleeding issues overnight from trach site 3/24 PEG (Dr. Janee Morn) 3/24 sputum culture with MRSA 3/27 vomited. TF  on hold. Abd film c/w ileus. Added reglan, got SSE. Did tolerate PSV all day  3/28 BMs x2 after SSE day prior. Added back TFs at 1/2 rated.   Interim History / Subjective:  Tolerated PS yesterday  Objective   Blood pressure 121/80, pulse 84, temperature 99.8 F (37.7 C), temperature source Axillary, resp. rate (!) 28, height 5' 7.99" (1.727 m), weight 102 kg, SpO2 99%.    Vent Mode: PSV;CPAP FiO2 (%):  [30 %] 30 % Set Rate:  [15 bmp] 15 bmp Vt Set:  [550 mL] 550 mL PEEP:  [5 cmH20] 5 cmH20 Pressure Support:  [8 cmH20-10 cmH20] 10 cmH20 Plateau Pressure:  [20 cmH20-21 cmH20] 21 cmH20   Intake/Output Summary (Last 24 hours) at 11/17/2023 1041 Last data filed at 11/17/2023 0800 Gross per 24 hour  Intake 4138.57 ml  Output 925 ml  Net 3213.57 ml   Filed Weights   11/15/23 0500 11/16/23 0500 11/17/23 0500  Weight: 102 kg 102 kg 102 kg   Physical Exam: General: Chronically ill-appearing, lethargic this am HENT: [place], AT, OP clear, MMM Neck: Trach in place, c/d/i Eyes: Left eye taped closed, EOMI, no scleral icterus Respiratory: Clear to auscultation bilaterally.  No crackles, wheezing or rales Cardiovascular: RRR, -M/R/G, no JVD GI: BS+, soft, nontender Extremities:Mild anasarca,-tenderness Neuro: Lethargic, not following commands, CNII-XII grossly intact  Imaging, labs and test in EMR in the last 24 hours reviewed independently by me. 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 MRSA PNA - sputum culture 3/24. CXR improved  Mental status and ileus barriers to ATC today  Plan Continue PSV as tolerated. Attempt ATC if able Cont routine trach care  Can remove trach sutures 3/29 Day 6 of 7 vanc for MRSA PNA  continue guaifenesin per tube, Hypertonic nebs, Chest PT. Will need LTAC referral - TOC consult placed.  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 Plan Trend cbc Transfuse for hgb < 7  Poor Dentition. -  A couple upper right teeth and one left lower tooth have chipped off. plan Will need dental evaluation at some point.    Best Practice (right click and "Reselect all SmartList Selections" daily)   Diet/type: tubefeeds DVT prophylaxis LMWH Pressure ulcer(s): N/A GI prophylaxis: H2B Lines: N/A Foley:  external catheter.  Code Status:  DNR with pre-arrest interventions   The patient is critically ill with multiple organ systems failure and requires high complexity decision making for assessment and support, frequent evaluation and titration of therapies, application of advanced monitoring technologies and extensive interpretation of multiple databases.  Independent Critical Care Time: 35 Minutes.   Mechele Collin, M.D. [facility] Pulmonary/Critical Care Medicine 11/17/2023 10:41 AM   Please see Amion for pager number to reach on-call Pulmonary and Critical Care Team.

## 2023-11-17 NOTE — Progress Notes (Addendum)
 STROKE TEAM PROGRESS NOTE   INTERIM HISTORY/SUBJECTIVE  No family at bedside.  RN at bedside.  Patient more obtunded today.  Does not follow commands.  Posturing seen with stimulation. Trach Collar attempted yesterday, plan to continue PSV as tolerated, per CCM.    OBJECTIVE CBC    Component Value Date/Time   WBC 17.6 (H) 11/17/2023 0559   RBC 2.51 (L) 11/17/2023 0559   HGB 7.8 (L) 11/17/2023 0559   HCT 25.6 (L) 11/17/2023 0559   PLT 720 (H) 11/17/2023 0559   MCV 102.0 (H) 11/17/2023 0559   MCH 31.1 11/17/2023 0559   MCHC 30.5 11/17/2023 0559   RDW 13.9 11/17/2023 0559   LYMPHSABS 2.0 11/09/2023 0544   MONOABS 1.1 (H) 11/09/2023 0544   EOSABS 0.2 11/09/2023 0544   BASOSABS 0.0 11/09/2023 0544   BMET    Component Value Date/Time   NA 143 11/17/2023 0559   K 3.6 11/17/2023 0559   CL 113 (H) 11/17/2023 0559   CO2 21 (L) 11/17/2023 0559   GLUCOSE 124 (H) 11/17/2023 0559   BUN 25 (H) 11/17/2023 0559   CREATININE 1.12 11/17/2023 0559   CALCIUM 8.8 (L) 11/17/2023 0559   GFRNONAA >60 11/17/2023 0559   IMAGING past 24 hours No results found.  Vitals:   11/17/23 0600 11/17/23 0700 11/17/23 0749 11/17/23 0800  BP: 108/67 111/64 111/64   Pulse: 87 87 91   Resp: 20 (!) 21 (!) 30   Temp:    100.3 F (37.9 C)  TempSrc:    Axillary  SpO2: 99% 97% 100%   Weight:      Height:       PHYSICAL EXAM General: Post tracheostomy and PEG placement almost a week ago.  NAD. HEENT: Left eye reddened, swollen CV: Regular rate and rhythm on monitor Respiratory: Mechanically ventilated, on trach.    NEURO (off of sedation since 3/14):  Mental Status: Awake, following simple commands.  Cranial Nerves:  II: Pupils pinpoint, nonreactive III, IV, VI: Eyes with slightly upward gaze, no EOM on command. V: Absent bilaterally VOZ:DGUYQI movement symmetric.  IX, X: Cough and gag intact XI: Head is midline  Motor/Sensory:  Extensor posturing seen with any stimulation.  No movement on  command in any extremity.   Coordination: Deferred Gait: Deferred  ASSESSMENT/PLAN  Garrett [name] is a 48 y.o. male with history of HTN admitted for ICH.    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 Basal cisterns and fourth ventricle are effaced without hydrocephalus at this time.  CTA head & neck no LVO CT repeat 3/9 no substantial change Repeat Ct head 3/18 Interval decrease in size of subacute pontine hemorrhage, Decreased regional mass effect, Interval decrease in scattered small volume subarachnoid and intraventricular hemorrhage. Repeat CTH 3/29:  Evolving subacute pontine hemorrhage and subarachnoid hemorrhage. No evidence of a new intracranial abnormality. 2D Echo EF 60 to 65%, moderate concentric LVH LDL 96 HgbA1c 4.7 UDS positive for opiates VTE prophylaxis - continue SQ lovenox No antithrombotic prior to admission, continue No antithrombotic secondary to ICH Therapy recommendations: CIR Disposition:  pending   Brainstem compression Continued poor neurological exam 3% hypertonic off Continue FW 200 Q2->4->6h Na 154> 159 >157 >158->151->149->147->144->146-146->147->143  Hypertension Home meds:  None Stable Continue Norvasc, hydralazine,  Lopressor Off clonidine and losartan PRN labetalol  BP goal less than 160 Long-term BP goal normotensive  Hypoxic respiratory failure Aspiration PNA Intubated in the ED PCCM following, appreciate assitance Trach placed 3/22 Plan for trach collar today CXR Streaky left basilar opacity  Tmax 101.5->100.4->102.1->100.9->100.6->100.1->101 WBC 20.4-> 9.8 -> 13.2-> 18.1-14.1--15.5->18.3->19.9->17.0->14.1->17.6 Unasyn completed, now  on Vanco for 4 days, ends 3/30 Resp Culture 3/22 positive for MRSA CXR 3/27 Negative  Dysphagia PEG tube placed 3/24 Restart tube feeding at 25cc/h Continue free water 200 cc Q6h  Substance use UDS positive for opioids Narcan given in ED  Other Active Problems Hypokalemia: K 2.6 -> 4.2-> 3.3-> 3.4->4.1-> 3.5-> 3.8 -> 4.1->3.7->3.2->3.6 Replenish, continue to monitor, daily checks AKI, Improving Cr: 1.12 today Hypermagnesia, improved: 3.0->2.9->3.0->3.2-> 2.5 -> 2.2 Left eye conjunctivitis, eyedrops   Hospital day # 21   Pt seen by Neuro NP/APP and later by MD. Note/plan to be edited by MD as needed.    [name], DNP, AGACNP-BC [facility] Please use AMION for contact information & EPIC for messaging.   ATTENDING NOTE: I reviewed above note and agree with the assessment and plan. Pt was seen and examined.   No family at bedside.  Patient lying bed, lethargic, not following commands, left eye taped shut due to conjunctivitis.  Right eye open on voice, however not close right eye on command.  Not follow simple commands, right eye midline, no movement on command.  Slight withdrawal to pain right lower extremity, however bilateral extension posturing with stimulation.  Repeat CT head showed no acute abnormality.  Now on vancomycin, still have low-grade fever and leukocytosis.  CCM on board.  Pending placement.  For detailed assessment and plan, please refer to above/below as I have made changes wherever appropriate.   Marvel Plan, MD PhD Stroke Neurology 11/17/2023 4:11 PM  This patient is critically ill due to brainstem hemorrhage, brainstem compression, pneumonia and dysphagia and at significant risk of neurological worsening, death form sepsis, septic shock, aspiration. This patient's care requires constant monitoring of vital signs, hemodynamics, respiratory and cardiac monitoring, review of multiple databases, neurological assessment, discussion with family,  other specialists and medical decision making of high complexity. I spent 35 minutes of neurocritical care time in the care of this patient.  I discussed with Dr. Everardo All CCM.

## 2023-11-18 DIAGNOSIS — G8250 Quadriplegia, unspecified: Secondary | ICD-10-CM | POA: Diagnosis not present

## 2023-11-18 DIAGNOSIS — I613 Nontraumatic intracerebral hemorrhage in brain stem: Secondary | ICD-10-CM | POA: Diagnosis not present

## 2023-11-18 DIAGNOSIS — K567 Ileus, unspecified: Secondary | ICD-10-CM | POA: Diagnosis not present

## 2023-11-18 DIAGNOSIS — I161 Hypertensive emergency: Secondary | ICD-10-CM | POA: Diagnosis not present

## 2023-11-18 DIAGNOSIS — J690 Pneumonitis due to inhalation of food and vomit: Secondary | ICD-10-CM | POA: Diagnosis not present

## 2023-11-18 DIAGNOSIS — Z515 Encounter for palliative care: Secondary | ICD-10-CM | POA: Diagnosis not present

## 2023-11-18 DIAGNOSIS — G935 Compression of brain: Secondary | ICD-10-CM | POA: Diagnosis not present

## 2023-11-18 DIAGNOSIS — J9601 Acute respiratory failure with hypoxia: Secondary | ICD-10-CM | POA: Diagnosis not present

## 2023-11-18 DIAGNOSIS — Z7189 Other specified counseling: Secondary | ICD-10-CM | POA: Diagnosis not present

## 2023-11-18 LAB — CBC
HCT: 25.8 % — ABNORMAL LOW (ref 39.0–52.0)
Hemoglobin: 8.1 g/dL — ABNORMAL LOW (ref 13.0–17.0)
MCH: 31.5 pg (ref 26.0–34.0)
MCHC: 31.4 g/dL (ref 30.0–36.0)
MCV: 100.4 fL — ABNORMAL HIGH (ref 80.0–100.0)
Platelets: 785 10*3/uL — ABNORMAL HIGH (ref 150–400)
RBC: 2.57 MIL/uL — ABNORMAL LOW (ref 4.22–5.81)
RDW: 13.9 % (ref 11.5–15.5)
WBC: 15.7 10*3/uL — ABNORMAL HIGH (ref 4.0–10.5)
nRBC: 0.1 % (ref 0.0–0.2)

## 2023-11-18 LAB — BASIC METABOLIC PANEL WITH GFR
Anion gap: 9 (ref 5–15)
BUN: 23 mg/dL — ABNORMAL HIGH (ref 6–20)
CO2: 21 mmol/L — ABNORMAL LOW (ref 22–32)
Calcium: 8.9 mg/dL (ref 8.9–10.3)
Chloride: 110 mmol/L (ref 98–111)
Creatinine, Ser: 1.03 mg/dL (ref 0.61–1.24)
GFR, Estimated: >60 mL/min (ref >60)
Glucose, Bld: 115 mg/dL — ABNORMAL HIGH (ref 70–99)
Potassium: 3.1 mmol/L — ABNORMAL LOW (ref 3.5–5.1)
Sodium: 140 mmol/L (ref 135–145)

## 2023-11-18 LAB — GLUCOSE, CAPILLARY
Glucose-Capillary: 98 mg/dL (ref 70–99)
Glucose-Capillary: 98 mg/dL (ref 70–99)
Glucose-Capillary: 103 mg/dL — ABNORMAL HIGH (ref 70–99)
Glucose-Capillary: 91 mg/dL (ref 70–99)
Glucose-Capillary: 90 mg/dL (ref 70–99)
Glucose-Capillary: 92 mg/dL (ref 70–99)

## 2023-11-18 MED ORDER — POTASSIUM CHLORIDE 10 MEQ/100ML IV SOLN
10.0000 meq | INTRAVENOUS | Status: AC
  Administered 2023-11-18 (×6): 10 meq via INTRAVENOUS
  Filled 2023-11-18 (×6): qty 100

## 2023-11-18 MED ORDER — FREE WATER
200.0000 mL | Freq: Three times a day (TID) | Status: DC
  Administered 2023-11-18 – 2023-11-24 (×17): 200 mL

## 2023-11-18 NOTE — Progress Notes (Addendum)
 STROKE TEAM PROGRESS NOTE   INTERIM HISTORY/SUBJECTIVE  Repeat CT yesterday shows evolving hemorrhages. No family at bedside. Neurology exam stable, continued extensor posturing with noxious stimuli. Patient appears to be in vegetative state. Pending LTAC placement for discharge.   OBJECTIVE CBC    Component Value Date/Time   WBC 17.6 (H) 11/17/2023 0559   RBC 2.51 (L) 11/17/2023 0559   HGB 7.8 (L) 11/17/2023 0559   HCT 25.6 (L) 11/17/2023 0559   PLT 720 (H) 11/17/2023 0559   MCV 102.0 (H) 11/17/2023 0559   MCH 31.1 11/17/2023 0559   MCHC 30.5 11/17/2023 0559   RDW 13.9 11/17/2023 0559   LYMPHSABS 2.0 11/09/2023 0544   MONOABS 1.1 (H) 11/09/2023 0544   EOSABS 0.2 11/09/2023 0544   BASOSABS 0.0 11/09/2023 0544   BMET    Component Value Date/Time   NA 143 11/17/2023 0559   K 3.6 11/17/2023 0559   CL 113 (H) 11/17/2023 0559   CO2 21 (L) 11/17/2023 0559   GLUCOSE 124 (H) 11/17/2023 0559   BUN 25 (H) 11/17/2023 0559   CREATININE 1.12 11/17/2023 0559   CALCIUM 8.8 (L) 11/17/2023 0559   GFRNONAA >60 11/17/2023 0559   IMAGING past 24 hours CT HEAD WO CONTRAST ( ) Result Date: 11/17/2023 CLINICAL DATA:  Altered mental status. EXAM: CT HEAD WITHOUT CONTRAST TECHNIQUE: Contiguous axial images were obtained from the base of the skull through the vertex without intravenous contrast. RADIATION DOSE REDUCTION: This exam was performed according to the departmental dose-optimization program which includes automated exposure control, adjustment of the mA and/or kV according to patient size and/or use of iterative reconstruction technique. COMPARISON:  Head CT 11/05/2023 FINDINGS: Brain: There has been further interval evolution of the subacute pontine hemorrhage which has decreased in overall density. The residual hyperdense component in the left pons now measures 2.4 x 2.1 x 1.8 cm (previously 3.5 x 3.4 x 3.3 cm). Surrounding low-density likely reflects a combination of residual  hypoattenuating blood products and edema. Associated posterior fossa mass effect is stable to slightly improved. Partial effacement of the fourth ventricle is unchanged. There is no evidence of hydrocephalus, with the lateral and third ventricles remaining normal in size. Small volume scattered subarachnoid hemorrhage is decreasing in density and amount. Small volume intraventricular hemorrhage has resolved. No new intracranial hemorrhage, acute infarct, midline shift, or extra-axial fluid collection is identified. Vascular: No hyperdense vessel. Skull: No acute fracture or suspicious lesion. Sinuses/Orbits: Small volume secretions in the sphenoid sinuses. Small bilateral mastoid effusions. Unremarkable orbits. Other: None. IMPRESSION: 1. Evolving subacute pontine hemorrhage and subarachnoid hemorrhage. 2. No evidence of a new intracranial abnormality. Electronically Signed   By: Sebastian Ache M.D.   On: 11/17/2023 13:35    Vitals:   11/18/23 0600 11/18/23 0700 11/18/23 0753 11/18/23 0806  BP: 123/66 127/70    Pulse: 85 84  90  Resp: 19 18  (!) 26  Temp:   99.4 F (37.4 C)   TempSrc:   Axillary   SpO2: 98% 99%  100%  Weight:      Height:       PHYSICAL EXAM General: Post tracheostomy and PEG placement almost a week ago.  NAD. HEENT: Left eye reddened, swollen CV: Regular rate and rhythm on monitor Respiratory: Mechanically ventilated, on trach.    NEURO (off of sedation since 3/14):  Mental Status: Awake, following simple commands.  Cranial Nerves:  II: Pupils pinpoint, nonreactive III, IV, VI: Eyes with slightly upward gaze, no EOM on command, no  tracking V: Absent bilaterally [id number]:[id number] movement symmetric.  IX, X: Cough and gag intact XI: Head is midline  Motor/Sensory:  Continued extensor posturing seen with any stimulation.  No movement on command in any extremity.   Coordination: Deferred Gait: Deferred  ASSESSMENT/PLAN  Mr. Antwoine Zorn is a 48 y.o. male with  history of HTN admitted for ICH.    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 Basal cisterns and fourth ventricle are effaced without hydrocephalus at this time.  CTA head & neck no LVO CT repeat 3/9 no substantial change Repeat Ct head 3/18 Interval decrease in size of subacute pontine hemorrhage, Decreased regional mass effect, Interval decrease in scattered small volume subarachnoid and intraventricular hemorrhage. Repeat CTH 3/29:  Evolving subacute pontine hemorrhage and subarachnoid hemorrhage. No evidence of a new intracranial abnormality. 2D Echo EF 60 to 65%, moderate concentric LVH LDL 96 HgbA1c 4.7 UDS positive for opiates VTE prophylaxis - continue SQ lovenox No antithrombotic prior to admission, continue No antithrombotic secondary to ICH Therapy recommendations: LTAC Disposition:  pending   Brainstem compression Continued poor neurological exam 3% hypertonic remains off Continue FW 200 Q2->4->6h Na 154> 159 >157 >158->151->149->147->144->146-146->147->143->140  Hypertension Home meds:  None Stable Continue Norvasc, hydralazine, Lopressor Off clonidine and losartan PRN labetalol  BP goal less than 160 Long-term BP goal normotensive  Hypoxic respiratory failure Aspiration PNA Intubated in the ED PCCM following, appreciate assitance Trach placed 3/22 Tolerated PS 3/29, continues today CXR Streaky left basilar opacity  Tmax 101.5->100.4->102.1->100.9->100.6->100.1->101>99.4 WBC 20.4->18.1-14.1--15.5->18.3->19.9->17.6->15.4 Unasyn completed, last day for Vanco Resp Culture 3/22 positive for MRSA CXR 3/27 Negative  Dysphagia PEG tube placed 3/24 Continue tube  feeding Decrease free water 200 cc to Q8h  Substance use UDS positive for opioids Narcan given in ED  Other Active Problems Hypokalemia: K 2.6 -> 4.2->...4.1->3.7->3.2->3.6->3.1 Replenish, continue to monitor, daily checks per CCM AKI, resolved, Cr: 1.12 -> 1.03 Hypermagnesia, improved: 3.0->2.9->3.0->3.2-> 2.5 -> 2.2 Left eye conjunctivitis, on eyedrops   Hospital day # 22   Pt seen by Neuro NP/APP and later by MD. Note/plan to be edited by MD as needed.    [name], DNP, AGACNP-BC [facility] Please use AMION for contact information & EPIC for messaging.   ATTENDING NOTE: I reviewed above note and agree with the assessment and plan. Pt was seen and examined.   No family bedside.  Patient lying in bed, status post trach and PEG on vent.  Opens right eye with stimulation, right eye taped shut with improved conjunctivitis.  However not follow commands, with stimulation bilateral upper and lower extremity extension posturing.  With pain stimulation slight withdraw on the left lower extremity and left upper extremity.  Bilateral Babinski positive, bilateral patellar reflexes 3+, bicep reflex 1+.  Repeat CT yesterday shows stable evolving pontine hemorrhage.  Patient condition now more consistent with vegetative state and hyperreflexia with subacute pontine hemorrhage.  Vital signs stable now, pending placement.  Vent management per CCM.  For detailed assessment and plan, please refer to above/below as I have made changes wherever appropriate.   Marvel Plan, MD PhD Stroke Neurology 11/18/2023 1:41 PM  This patient is critically ill due to brainstem hemorrhage, brainstem compression, pneumonia and dysphagia and at significant risk of neurological worsening, death  form sepsis, septic shock, aspiration. This patient's care requires constant monitoring of vital signs, hemodynamics, respiratory and cardiac monitoring, review of multiple databases, neurological assessment,  discussion with family, other specialists and medical decision making of high complexity. I spent 35 minutes of neurocritical care time in the care of this patient.  I discussed with Dr. Everardo All CCM.

## 2023-11-18 NOTE — Progress Notes (Signed)
 Pharmacy Electrolyte Replacement  Recent Labs:  Recent Labs    11/16/23 1653 11/17/23 0559 11/18/23 0937  K 3.2*   < > 3.1*  MG 2.2  --   --   CREATININE 1.01   < > 1.03   < > = values in this interval not displayed.    Low Critical Values (K </= 2.5, Phos </= 1, Mg </= 1) Present: None  MD Contacted: n/a  Plan: KCL 10 mEq x 6 runs   Garrett [name], PharmD, BCPS, BCCCP Clinical Pharmacist

## 2023-11-18 NOTE — Progress Notes (Signed)
 NAME:  Garrett [name], MRN:  [id number], DOB:  18-Mar-1976, LOS: 22 ADMISSION DATE:  10/27/2023, CONSULTATION DATE:  10/27/2023 REFERRING MD:  Coralee Pesa, DO CHIEF COMPLAINT:  Found Down  History of Present Illness:   48 y/o male with h/o HTN who was found down for unknown downtime by his [place] when she came home from work, he was having agonal breathing.  He apparently had driven her to work this morning.  He has HTN but never checks his BP and does not follow with MD.  She says you can tell when his BP is really high; his eyes get blood shot and he is sweating.  No h/o seizures.  Besides almost daily [name] and cigarette smoking, she is not aware of any other drugs. Drug screen positive for opioids and he was given several doses of Narcan. Patient found to have:  1. 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 Basal cisterns and fourth ventricle are effaced without hydrocephalus at this time. 2. Trace additional subarachnoid hemorrhage along the left parietal convexity. BP in ED 262/156. His cxr revealed a RUL and perihilar infiltrates suggesting aspiration pneumonia. ED labs also revealed PH 7.169, LA 4.4, CPK 985. Patient was intubated in ED.  Pertinent  Medical History  HTN  Significant Hospital Events: Including procedures, antibiotic start and stop dates in addition to other pertinent events   3/8: Intubated in ED, pontine bleed/SAH. CT head 17:09 Acute large hemorrhage 4.1 cm in pons with intraventricular extension without hydrocephalus 3/9: CTA 03:14 AM No change in IPH in pons, similar biparietal [facility] 3/14: Now awake, appears locked in can follow commands with eyes. 3/16 Purposeful with left upper extremity  3/22 tracheostomy placed at bedside, bleeding issues overnight from trach site 3/24 PEG (Dr. Janee Morn) 3/24 sputum culture with MRSA 3/27 vomited. TF  on hold. Abd film c/w ileus. Added reglan, got SSE. Did tolerate PSV all day  3/28 BMs x2 after SSE day prior. Added back TFs at 1/2 rated. Tolerated PS 3/29 Tolerated PS  Interim History / Subjective:  Yesterday, worsening lethargy and neuro exam with increased posturing on right side when previously following commands on right side  Objective   Blood pressure 127/70, pulse 84, temperature 99 F (37.2 C), temperature source Axillary, resp. rate 18, height 5' 7.99" (1.727 m), weight 102 kg, SpO2 99%.    Vent Mode: PRVC FiO2 (%):  [30 %] 30 % Set Rate:  [15 bmp] 15 bmp Vt Set:  [550 mL] 550 mL PEEP:  [5 cmH20] 5 cmH20 Pressure Support:  [10 cmH20] 10 cmH20 Plateau Pressure:  [18 cmH20-20 cmH20] 18 cmH20   Intake/Output Summary (Last 24 hours) at 11/18/2023 0717 Last data filed at 11/18/2023 0600 Gross per 24 hour  Intake 2140.51 ml  Output 1250 ml  Net 890.51 ml   Filed Weights   11/16/23 0500 11/17/23 0500 11/18/23 0500  Weight: 102 kg 102 kg 102 kg   Physical Exam: General: Chronically ill-appearing, no acute distress HENT: [place], AT, OP clear, MMM Eyes: Right eye taped, no scleral icterus Neck: Trach in place, c/d/i Respiratory: Clear to auscultation bilaterally.  No crackles, wheezing or rales Cardiovascular: RRR, -M/R/G, no JVD GI: BS+, soft, nontender Extremities: Mild anasarca,-tenderness Neuro: Left arm and leg posturing, CNII-XII grossly intact  Imaging, labs and test in EMR in the last 24 hours reviewed independently by me. 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 MRSA PNA - sputum culture 3/24.  Plan Continue PSV as tolerated. Attempt ATC Cont routine trach care  Can remove trach sutures 3/29 Day 7 of 7 vanc for MRSA PNA  continue guaifenesin per tube, Chest PT. Will need LTAC referral - TOC consult placed.  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 Plan Trend cbc Transfuse for hgb < 7  Poor Dentition. -  A couple upper right teeth and one left lower tooth have chipped off. plan Will need dental evaluation at some point.    Best Practice (right click and "Reselect all SmartList Selections" daily)   Diet/type: tubefeeds DVT prophylaxis LMWH Pressure ulcer(s): N/A GI prophylaxis: H2B Lines: N/A Foley:  external catheter.  Code Status:  DNR with pre-arrest interventions   The patient is critically ill with multiple organ systems failure and requires high complexity decision making for assessment and support, frequent evaluation and titration of therapies, application of advanced monitoring technologies and extensive interpretation of multiple databases.  Independent Critical Care Time: 30 Minutes.   Mechele Collin, M.D. [facility] Pulmonary/Critical Care Medicine 11/18/2023 7:18 AM   Please see Amion for pager number to reach on-call Pulmonary and Critical Care Team.

## 2023-11-18 NOTE — Progress Notes (Signed)
 Pt vomited X1. Dr. Everardo All in room. Orders to place NG back to LIWS and hold tube feeds given.

## 2023-11-18 NOTE — Progress Notes (Signed)
 Palliative Medicine Progress Note   Patient Name: Garrett [name]       Date: 11/18/2023 DOB: 05/30/1976  Age: 48 y.o. MRN#: [id number] Attending Physician: Luciano Cutter, MD Primary Care Physician: Aviva Kluver Admit Date: 10/27/2023   HPI/Patient Profile: 48 y.o. male  with past medical history of hypertension who presented to the ED on 10/27/2023 after he was found down at home by his fiance. He was found to have an acute large hemorrhage centered in the pons with intraventricular extension in the fourth ventricle and also extension into the basal cisterns. Patient was intubated in the ED.    Palliative Medicine was consulted for goals of care in the setting of "large ICH".   3/11 - initial palliative consult and family meeting 3/13 - additional family meeting with stroke team, PMT, and PCCM; family verbalized understanding of poor prognosis but did not want to proceed with trach/PEG  3/14 - now awake, appears locked in 3/16 - able to follow commands intermittently with LUE and will move eyes vertically to command 3/18 - family requesting to wait full 14 days before placing trach 3/22 - tracheostomy placed at bedside 3/24 - PEG placed 3/28 - awake, able to follow a few simple commands 3/29 - more obtunded, does not follow commands   Subjective: Chart reviewed including vital signs, labs, and progress notes. Update received from RN. Patient assessed at bedside. Noted to have extensor posturing.   No family at bedside currently.    Objective:  Physical Exam Constitutional:      General: He is not in acute distress.    Appearance: He is ill-appearing.  Eyes:     Comments: Left eye taped  Cardiovascular:     Rate and Rhythm: Normal rate.  Pulmonary:     Comments: Trach/vent             Palliative Medicine Assessment & Plan   Assessment: Principal Problem:   ICH (intracerebral hemorrhage) (HCC) Active Problems:   Intracranial hemorrhage (HCC)    Recommendations/Plan: Continue full scope care Allow time for outcomes PMT will sign off at this time, please re-consult if needed   Code Status: DNR - interventions   Prognosis:  Unable to determine  Discharge Planning: To Be Determined   Thank you for allowing the Palliative Medicine Team to assist in the  care of this patient.   Time: 25 minutes   Merry Proud, NP   Please contact Palliative Medicine Team phone at [phone] for questions and concerns.  For individual providers, please see AMION.

## 2023-11-19 DIAGNOSIS — J9601 Acute respiratory failure with hypoxia: Secondary | ICD-10-CM | POA: Diagnosis not present

## 2023-11-19 DIAGNOSIS — I613 Nontraumatic intracerebral hemorrhage in brain stem: Secondary | ICD-10-CM | POA: Diagnosis not present

## 2023-11-19 DIAGNOSIS — K567 Ileus, unspecified: Secondary | ICD-10-CM | POA: Diagnosis not present

## 2023-11-19 DIAGNOSIS — Z930 Tracheostomy status: Secondary | ICD-10-CM | POA: Diagnosis not present

## 2023-11-19 DIAGNOSIS — J690 Pneumonitis due to inhalation of food and vomit: Secondary | ICD-10-CM | POA: Diagnosis not present

## 2023-11-19 DIAGNOSIS — G8250 Quadriplegia, unspecified: Secondary | ICD-10-CM | POA: Diagnosis not present

## 2023-11-19 DIAGNOSIS — G935 Compression of brain: Secondary | ICD-10-CM | POA: Diagnosis not present

## 2023-11-19 LAB — GLUCOSE, CAPILLARY
Glucose-Capillary: 102 mg/dL — ABNORMAL HIGH (ref 70–99)
Glucose-Capillary: 102 mg/dL — ABNORMAL HIGH (ref 70–99)
Glucose-Capillary: 102 mg/dL — ABNORMAL HIGH (ref 70–99)
Glucose-Capillary: 85 mg/dL (ref 70–99)
Glucose-Capillary: 96 mg/dL (ref 70–99)
Glucose-Capillary: 98 mg/dL (ref 70–99)

## 2023-11-19 LAB — CBC WITH DIFFERENTIAL/PLATELET
Abs Immature Granulocytes: 0.2 10*3/uL — ABNORMAL HIGH (ref 0.00–0.07)
Basophils Absolute: 0.1 10*3/uL (ref 0.0–0.1)
Basophils Relative: 0 %
Eosinophils Absolute: 0.2 10*3/uL (ref 0.0–0.5)
Eosinophils Relative: 1 %
HCT: 25.3 % — ABNORMAL LOW (ref 39.0–52.0)
Hemoglobin: 7.9 g/dL — ABNORMAL LOW (ref 13.0–17.0)
Immature Granulocytes: 1 %
Lymphocytes Relative: 15 %
Lymphs Abs: 2.3 10*3/uL (ref 0.7–4.0)
MCH: 31.7 pg (ref 26.0–34.0)
MCHC: 31.2 g/dL (ref 30.0–36.0)
MCV: 101.6 fL — ABNORMAL HIGH (ref 80.0–100.0)
Monocytes Absolute: 1.1 10*3/uL — ABNORMAL HIGH (ref 0.1–1.0)
Monocytes Relative: 8 %
Neutro Abs: 10.9 10*3/uL — ABNORMAL HIGH (ref 1.7–7.7)
Neutrophils Relative %: 75 %
Platelets: 748 10*3/uL — ABNORMAL HIGH (ref 150–400)
RBC: 2.49 MIL/uL — ABNORMAL LOW (ref 4.22–5.81)
RDW: 14.1 % (ref 11.5–15.5)
WBC: 14.7 10*3/uL — ABNORMAL HIGH (ref 4.0–10.5)
nRBC: 0 % (ref 0.0–0.2)

## 2023-11-19 LAB — BASIC METABOLIC PANEL WITH GFR
Anion gap: 11 (ref 5–15)
BUN: 20 mg/dL (ref 6–20)
CO2: 19 mmol/L — ABNORMAL LOW (ref 22–32)
Calcium: 8.9 mg/dL (ref 8.9–10.3)
Chloride: 113 mmol/L — ABNORMAL HIGH (ref 98–111)
Creatinine, Ser: 1.02 mg/dL (ref 0.61–1.24)
GFR, Estimated: >60 mL/min (ref >60)
Glucose, Bld: 93 mg/dL (ref 70–99)
Potassium: 3.7 mmol/L (ref 3.5–5.1)
Sodium: 143 mmol/L (ref 135–145)

## 2023-11-19 LAB — MAGNESIUM: Magnesium: 2 mg/dL (ref 1.7–2.4)

## 2023-11-19 LAB — PHOSPHORUS: Phosphorus: 3.2 mg/dL (ref 2.5–4.6)

## 2023-11-19 MED ORDER — FUROSEMIDE 10 MG/ML IJ SOLN
60.0000 mg | Freq: Once | INTRAMUSCULAR | Status: AC
  Administered 2023-11-19: 60 mg via INTRAVENOUS
  Filled 2023-11-19: qty 6

## 2023-11-19 MED ORDER — METOCLOPRAMIDE HCL 5 MG/ML IJ SOLN
10.0000 mg | Freq: Three times a day (TID) | INTRAMUSCULAR | Status: AC
  Administered 2023-11-19 – 2023-11-21 (×8): 10 mg via INTRAVENOUS
  Filled 2023-11-19 (×8): qty 2

## 2023-11-19 NOTE — Progress Notes (Addendum)
 STROKE TEAM PROGRESS NOTE   INTERIM HISTORY/SUBJECTIVE No family at bedside. Plan to d/c to LTAC- looking in [place], does have an ileus- CCM management. Seen with RN and CCM.   OBJECTIVE CBC    Component Value Date/Time   WBC 14.7 (H) 11/19/2023 0824   RBC 2.49 (L) 11/19/2023 0824   HGB 7.9 (L) 11/19/2023 0824   HCT 25.3 (L) 11/19/2023 0824   PLT 748 (H) 11/19/2023 0824   MCV 101.6 (H) 11/19/2023 0824   MCH 31.7 11/19/2023 0824   MCHC 31.2 11/19/2023 0824   RDW 14.1 11/19/2023 0824   LYMPHSABS 2.3 11/19/2023 0824   MONOABS 1.1 (H) 11/19/2023 0824   EOSABS 0.2 11/19/2023 0824   BASOSABS 0.1 11/19/2023 0824   BMET    Component Value Date/Time   NA 143 11/19/2023 0824   K 3.7 11/19/2023 0824   CL 113 (H) 11/19/2023 0824   CO2 19 (L) 11/19/2023 0824   GLUCOSE 93 11/19/2023 0824   BUN 20 11/19/2023 0824   CREATININE 1.02 11/19/2023 0824   CALCIUM 8.9 11/19/2023 0824   GFRNONAA >60 11/19/2023 0824   IMAGING past 24 hours No results found.   Vitals:   11/19/23 1000 11/19/23 1100 11/19/23 1200 11/19/23 1208  BP: (!) 120/100 135/79 (!) 144/101   Pulse: 97 95 90 90  Resp: (!) 29 (!) 27 (!) 32 (!) 21  Temp:   99.7 F (37.6 C)   TempSrc:   Axillary   SpO2: 91% 96% 97% 98%  Weight:      Height:       PHYSICAL EXAM General: Post tracheostomy and PEG placement almost a week ago.  NAD. HEENT: Left eye reddened, swollen CV: Regular rate and rhythm on monitor Respiratory: Mechanically ventilated, on trach.    NEURO (off of sedation since 3/14):  Mental Status: Awake, following simple commands.  Cranial Nerves:  II: Pupils pinpoint, nonreactive III, IV, VI: Eyes with slightly upward gaze, no EOM on command, no tracking V: Absent bilaterally JWJ:XBJYNW movement symmetric.  IX, X: Cough and gag intact XI: Head is midline  Motor/Sensory:  Continued extensor posturing seen with any stimulation.  No movement on command in any extremity.   Coordination:  Deferred Gait: Deferred  ASSESSMENT/PLAN  Mr. Juanya Villavicencio is a 48 y.o. male with history of HTN admitted for ICH.    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 Basal cisterns and fourth ventricle are effaced without hydrocephalus at this time.  CTA head & neck no LVO CT repeat 3/9 no substantial change Repeat Ct head 3/18 Interval decrease in size of subacute pontine hemorrhage, Decreased regional mass effect, Interval decrease in scattered small volume subarachnoid and intraventricular hemorrhage. Repeat CTH 3/29:  Evolving subacute pontine hemorrhage and subarachnoid hemorrhage. No evidence of a new intracranial abnormality. 2D Echo EF 60 to 65%, moderate concentric LVH LDL 96 HgbA1c 4.7 UDS positive for opiates VTE prophylaxis - continue SQ lovenox No antithrombotic prior to admission, continue No antithrombotic secondary to ICH Therapy recommendations: LTAC Disposition:  pending   Brainstem compression Continued poor neurological exam 3% hypertonic remains off Continue FW 200 Q2->4->6h Na 154> 159 >157 >158->151->149->147->144->146-146->147->143->140 -> 143 STABLE  Hypertension Home meds:  None Stable Continue Norvasc, hydralazine, Lopressor Off clonidine and losartan  PRN labetalol  BP goal less than 160 Long-term BP goal normotensive  Hypoxic respiratory failure Aspiration PNA Intubated in the ED PCCM following, appreciate assitance Trach placed 3/22 Tolerated PS 3/29, continues today CXR Streaky left basilar opacity  Tmax 101.5->100.4->102.1->100.9->100.6->100.1->101>99.4 ->99.7 WBC 20.4->18.1-14.1--15.5->18.3->19.9->17.6->15.4-> 14.7 Unasyn completed, last day for  Vanco Resp Culture 3/22 positive for MRSA CXR 3/27 Negative  Dysphagia PEG tube placed 3/24 TF - ON HOLD DUE TO ILEUS Decrease free water 200 cc to Q8h  Substance use UDS positive for opioids Narcan given in ED  Other Active Problems Hypokalemia: K 2.6 -> 4.2->...4.1->3.7->3.2->3.6->3.1 -> 3.7 Replenish, continue to monitor, daily checks per CCM AKI, resolved, Cr: 1.12 -> 1.03-> 1.02 Hypermagnesia, improved: 3.0->2.9->3.0->3.2-> 2.5 -> 2.2 -> 2.0 Left eye conjunctivitis, on eyedrops Ileus CCM management TF off  Reglan started today   Hospital day # 23   Patient seen and examined by NP/APP with MD. MD to update note as needed.   Elmer Picker, DNP, FNP-BC [facility] Pager: [phone]  ATTENDING NOTE: I reviewed above note and agree with the assessment and plan. Pt was seen and examined.   No family bedside.  Patient neuro unchanged, open eyes on stimulation, left eye proptosis with conjunctivitis but improving.  No tracking, not blinking to visual threat but spontaneous blinking more on the right eye.  With stimulation bilateral upper extremity posturing, bilateral lower extremity mild withdraw to pain stimulation.  Patient has ileus, now off tube feeding, started on Reglan.  CCM is managing.  Nothing to add from neuro standpoint at this time, will sign off.  Please feel free to call with questions.  For detailed assessment and plan, please refer to above/below as I have made changes wherever appropriate.   Neurology will sign off. Please call with questions. Pt will follow up with stroke clinic NP at [facility] in about 8 weeks. Thanks for the consult.   Marvel Plan, MD PhD Stroke Neurology 11/19/2023 5:33 PM

## 2023-11-19 NOTE — Progress Notes (Signed)
 Labs reviewed 11/19/2023 Na 143/ K 3.7/Mag 2/Phos 3.2, Cl 113, CO2 19( 21)/ BUN 20/ Creatinine 1.02 WBC 14.7 ( 15.7)/ HGB 7.9 ( 8.1), Plts 748 ( 785)

## 2023-11-19 NOTE — Progress Notes (Addendum)
 NAME:  Garrett [name], MRN:  [id number], DOB:  July 02, 1976, LOS: 23 ADMISSION DATE:  10/27/2023, CONSULTATION DATE:  10/27/2023 REFERRING MD:  Coralee Pesa, DO CHIEF COMPLAINT:  Found Down  History of Present Illness:   48 y/o male with h/o HTN who was found down for unknown downtime by his [place] when she came home from work, he was having agonal breathing.  He apparently had driven her to work this morning.  He has HTN but never checks his BP and does not follow with MD.  She says you can tell when his BP is really high; his eyes get blood shot and he is sweating.  No h/o seizures.  Besides almost daily [name] and cigarette smoking, she is not aware of any other drugs. Drug screen positive for opioids and he was given several doses of Narcan. Patient found to have:  1. 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 Basal cisterns and fourth ventricle are effaced without hydrocephalus at this time. 2. Trace additional subarachnoid hemorrhage along the left parietal convexity. BP in ED 262/156. His cxr revealed a RUL and perihilar infiltrates suggesting aspiration pneumonia. ED labs also revealed PH 7.169, LA 4.4, CPK 985. Patient was intubated in ED.  Pertinent  Medical History  HTN  Significant Hospital Events: Including procedures, antibiotic start and stop dates in addition to other pertinent events   3/8: Intubated in ED, pontine bleed/SAH. CT head 17:09 Acute large hemorrhage 4.1 cm in pons with intraventricular extension without hydrocephalus 3/9: CTA 03:14 AM No change in IPH in pons, similar biparietal [facility] 3/14: Now awake, appears locked in can follow commands with eyes. 3/16 Purposeful with left upper extremity  3/22 tracheostomy placed at bedside, bleeding issues overnight from trach site 3/24 PEG (Dr. Janee Morn) 3/24 sputum culture with MRSA 3/27 vomited. TF  on hold. Abd film c/w ileus. Added reglan, got SSE. Did tolerate PSV all day  3/28 BMs x2 after SSE day prior. Added back TFs at 1/2 rated. Tolerated PS 3/29 Tolerated PS  3/31 Tolerating CPAP PS 15/5, TF on hold due to ileus. Interim History / Subjective:  More alert 3/30 after taken to CT for Head imaging. Today sleepy, intermittently follows simple commands on L. Objective   Blood pressure 128/78, pulse 89, temperature 98.7 F (37.1 C), temperature source Axillary, resp. rate 17, height 5' 7.99" (1.727 m), weight 102 kg, SpO2 98%.    Vent Mode: PRVC FiO2 (%):  [40 %] 40 % Set Rate:  [15 bmp] 15 bmp Vt Set:  [550 mL] 550 mL PEEP:  [5 cmH20] 5 cmH20 Plateau Pressure:  [17 cmH20-26 cmH20] 17 cmH20   Intake/Output Summary (Last 24 hours) at 11/19/2023 4098 Last data filed at 11/19/2023 0600 Gross per 24 hour  Intake 975.49 ml  Output 975 ml  Net 0.49 ml   Filed Weights   11/17/23 0500 11/18/23 0500 11/19/23 0500  Weight: 102 kg 102 kg 102 kg   Physical Exam: General: Chronically ill-appearing, no acute distress HENT: [place], AT, OP clear, MMM, trach, Cor Track to suction with dark colored drainage, ? bloody Eyes: Left  eye taped, no scleral icterus Neck: Trach in place, c/d/I Respiratory: Bilateral chest excursion, few rhonchi bilaterally  No crackles, wheezing few basilar  rales,   Cardiovascular: RRR, -M/R/G, no JVD GI: BS+, soft, non tender, slightly distended, PEG, site clean, dressing intact Extremities: Mild anasarca,-tenderness Neuro: Left arm and  leg posturing, sometimes will give a thumbs up with left thumb, very intermittent  Imaging, labs and test in EMR in the last 24 hours reviewed independently by me. Pertinent findings below: No Labs 3/31. 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 MRSA PNA - sputum culture 3/24.  Plan Continue PSV as tolerated. Attempt ATC Cont routine trach care  Trach sutures removed 3/29 Completed vanc for MRSA 3/30 PNA  Continue guaifenesin per tube, Chest PT. CXR prn Will need LTAC referral - TOC consult placed.  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 Plan Trend cbc Transfuse for hgb < 7  Poor Dentition. -  A couple upper right teeth and one left lower tooth have chipped off. plan Will need dental evaluation at some point.  Ileus TF on hold and Cortrack to suction with about 400 cc bloody looking drainage 3/31 Plan Continue Reglan Minimize sedatives/ opioids and anticholinergics KUB prn Continue H2 Blocker Trial of metoclopramide x 72 hours for hiccups ordered 3/31 overnight   Best Practice (right click and "Reselect all SmartList Selections" daily)   Diet/type: tubefeeds DVT prophylaxis LMWH Pressure ulcer(s): N/A GI prophylaxis: H2B Lines: N/A Foley:  external catheter.  Code Status:  DNR with pre-arrest interventions   The patient is critically ill with multiple organ systems failure and requires high complexity decision making for assessment and support, frequent evaluation and titration of therapies, application of advanced monitoring technologies and extensive interpretation of multiple databases.   Independent Critical Care Time: 35 Minutes.   Bevelyn Ngo, MSN, AGACNP-BC [facility] Pulmonary/Critical Care Medicine See Amion for personal pager PCCM on call pager [phone]  11/19/2023 8:06 AM   Please see Amion for pager number to reach on-call Pulmonary and Critical Care Team.

## 2023-11-19 NOTE — Progress Notes (Signed)
 Orthopedic Tech Progress Note Patient Details:  [name] Nov 18, 1975 [id number]  Patient ID: Garrett [name], male   DOB: [birth year], 48 y.o.   MRN: [id number] Called in [employer] for bilateral BK shrinkers.  Blase Mess 11/19/2023, 3:29 PM

## 2023-11-19 NOTE — Progress Notes (Signed)
 Nutrition Follow-up  DOCUMENTATION CODES:   Not applicable  INTERVENTION:  Continue Tube feeding via Cortrak tube when medically feasible: Osmolite 1.5 @  50 ml/h (1200 ml per day) Prosource TF20 60 ml TID   Provides 2160 kcal, 123 gm protein, 1003 ml free water daily   200 ml free water every 4 hours Total free water: 2112 ml   NUTRITION DIAGNOSIS:   Inadequate oral intake related to inability to eat as evidenced by NPO status.  - Still applicable   GOAL:   Patient will meet greater than or equal to 90% of their needs  - Meeting via TF  MONITOR:   TF tolerance, I & O's  REASON FOR ASSESSMENT:   Consult Enteral/tube feeding initiation and management  ASSESSMENT:   Pt with PMH of HTN, daily mariajuana, and smoker admitted after being found down at home with pontine hemorrhagic stroke and trace SAH. UDS positive for opioids. Noted aspiration PNA on admission.  3/8 - admitted with [facility], intubated 3/9 - Adult TF protocol started 3/14 - s/p cortrak placement, tip gastric 3/22 Trach placed 3/24 - PEG placed   Patient on trach collar requiring ventilator support. Tube feeds were running via PEG @ 25 ml/hr since 3/28, now currently off due to ileus. Cortrak being used for suction. Clamping trials today. Had bowel movement today. Per MD may start trickles back later today. Mild edema starting lasix.   MV: 16.5 L/min Temp (24hrs), Avg:99.1 F (37.3 C), Min:98.3 F (36.8 C), Max:99.8 F (37.7 C)  MAP (cuff): 95 mmHg  Admit weight: 103 kg   Current weight: 102 kg    Intake/Output Summary (Last 24 hours) at 11/19/2023 1537 Last data filed at 11/19/2023 1530 Gross per 24 hour  Intake 850.03 ml  Output 1425 ml  Net -574.97 ml   Net IO Since Admission: 7,071.82 mL [11/19/23 1537]  Drains/Lines: OGT: 400 ml x 24 hours  1 emesis occurrence  UOP 575 ml x 24 hours   Nutritionally Relevant Medications: Scheduled Meds:  docusate  100 mg Per Tube BID   erythromycin    Left Eye Q8H   feeding supplement (PROSource TF20)  60 mL Per Tube TID   free water  200 mL Per Tube Q8H   guaiFENesin  30 mL Per Tube BID   hydrALAZINE  100 mg Per Tube Q8H   insulin aspart  0-15 Units Subcutaneous Q4H   metoCLOPramide (REGLAN) injection  10 mg Intravenous Q8H   senna  1 tablet Per Tube BID   Continuous Infusions:  feeding supplement (OSMOLITE 1.5 CAL) Stopped (11/18/23 0901)   Labs Reviewed: No pertinent labs  CBG ranges from 85-103 mg/dL over the last 24 hours HgbA1c 4.7  Diet Order:   Diet Order             Diet NPO time specified  Diet effective now                   EDUCATION NEEDS:   No education needs have been identified at this time  Skin:  Skin Assessment: Skin Integrity Issues: Skin Integrity Issues:: Stage II Stage II: Stage 2, perineum   Last BM:  11/19/23, type 7, large  Height:   Ht Readings from Last 1 Encounters:  11/05/23 5' 7.99" (1.727 m)    Weight:   Wt Readings from Last 1 Encounters:  11/19/23 102 kg    Ideal Body Weight:  70 kg  BMI:  Body mass index is 34.2 kg/m.  Estimated Nutritional Needs:   Kcal:  2000-2200  Protein:  125-140 grams  Fluid:  >2 L/day   Elliot Dally, RD Registered Dietitian  See Amion for more information

## 2023-11-19 NOTE — Progress Notes (Addendum)
 Physical Therapy Treatment Patient Details Name: Garrett [name] MRN: [id number] DOB: May 29, 1976 Today's Date: 11/19/2023   History of Present Illness Pt is a 48 yo male who was found down 10/27/23 with agonal breathing. 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 Intubated 3/8, cortrak placed 3/14, trach placed 3/22, PEG placed 3/24. PMH: HTN, smoker    PT Comments  Pt following two commands during session; noted minimal movement of left foot, otherwise no voluntary muscle activation noted. Intermittent RUE extensor posturing. Bed placed in chair position to promote upright and ROM performed to all extremities. Placed wedge to promote neutral positioning. Will continue to progress as tolerated.    If plan is discharge home, recommend the following: Two people to help with walking and/or transfers;Two people to help with bathing/dressing/bathroom;Assistance with cooking/housework;Assistance with feeding;Direct supervision/assist for medications management;Direct supervision/assist for financial management;Assist for transportation;Help with stairs or ramp for entrance;Supervision due to cognitive status   Can travel by private vehicle        Equipment Recommendations  [place] lift;Hospital bed;Wheelchair (measurements PT);Wheelchair cushion (measurements PT);BSC/3in1    Recommendations for Other Services       Precautions / Restrictions Precautions Precautions: Fall;Other (comment) Recall of Precautions/Restrictions: Impaired Precaution/Restrictions Comments: trach on vent, PEG, abdominal binder, SBP < 160, watch RR Restrictions Weight Bearing Restrictions Per Provider Order: No     Mobility  Bed Mobility Overal bed mobility: Needs Assistance             General bed mobility comments: +2 total assist for  repositioning    Transfers                   General transfer comment: deferred    Ambulation/Gait                   Stairs             Wheelchair Mobility     Tilt Bed    Modified Rankin (Stroke Patients Only) Modified Rankin (Stroke Patients Only) Pre-Morbid Rankin Score: No symptoms Modified Rankin: Severe disability     Balance                                            Communication Communication Communication: Impaired Factors Affecting Communication: Trach/intubated  Cognition Arousal: Lethargic Behavior During Therapy: Flat affect   PT - Cognitive impairments: Difficult to assess Difficult to assess due to: Level of arousal                     PT - Cognition Comments: Pt initially with left gaze, then maintained forward gaze. Able to follow 2 commands during session Following commands: Impaired Following commands impaired: Follows one step commands inconsistently    Cueing Cueing Techniques: Verbal cues, Gestural cues, Tactile cues, Visual cues  Exercises General Exercises - Upper Extremity Shoulder Flexion: PROM, Both, 5 reps, Supine General Exercises - Lower Extremity Ankle Circles/Pumps: PROM, Both, 5 reps, Supine Short Arc Quad: PROM, Both, 5 reps, Supine Heel Slides: PROM, Both, 5 reps, Supine Hip ABduction/ADduction: PROM, Both, 5 reps, Supine    General Comments General comments (skin integrity, edema, etc.): SPO2 96% on 40% FiO2, 5 PEEP, PRVC. RR 26-40's (RT present), BP 144/101 (113)  Pertinent Vitals/Pain Pain Assessment Pain Assessment: CPOT Facial Expression: Relaxed, neutral Body Movements: Absence of movements Muscle Tension: Relaxed Compliance with ventilator (intubated pts.): N/A Vocalization (extubated pts.): N/A CPOT Total: 0    Home Living                          Prior Function            PT Goals (current goals can now be found in the care plan  section) Acute Rehab PT Goals Patient Stated Goal: did not state Potential to Achieve Goals: Fair    Frequency    Min 2X/week      PT Plan      Co-evaluation              AM-PAC PT "6 Clicks" Mobility   Outcome Measure  Help needed turning from your back to your side while in a flat bed without using bedrails?: Total Help needed moving from lying on your back to sitting on the side of a flat bed without using bedrails?: Total Help needed moving to and from a bed to a chair (including a wheelchair)?: Total Help needed standing up from a chair using your arms (e.g., wheelchair or bedside chair)?: Total Help needed to walk in hospital room?: Total Help needed climbing 3-5 steps with a railing? : Total 6 Click Score: 6    End of Session Equipment Utilized During Treatment: Oxygen Activity Tolerance: Other (comment) (limited by ability to follow commands) Patient left: in bed;with call bell/phone within reach Nurse Communication: Mobility status PT Visit Diagnosis: Muscle weakness (generalized) (M62.81);Difficulty in walking, not elsewhere classified (R26.2);Other symptoms and signs involving the nervous system (R29.898);Hemiplegia and hemiparesis Hemiplegia - Right/Left: Right Hemiplegia - caused by: Nontraumatic intracerebral hemorrhage     Time: 1145-1212 PT Time Calculation (min) (ACUTE ONLY): 27 min  Charges:    $Therapeutic Exercise: 8-22 mins $Therapeutic Activity: 8-22 mins PT General Charges $$ ACUTE PT VISIT: 1 Visit                     Lillia Pauls, PT, DPT Acute Rehabilitation Services Office [phone]    Garrett [name] 11/19/2023, 2:26 PM

## 2023-11-19 NOTE — TOC Initial Note (Signed)
 Transition of Care [facility]) - Initial/Assessment Note    Patient Details  Name: Garrett [name] MRN: [id number] Date of Birth: 08/13/1976  Transition of Care [facility]) CM/SW Contact:    Mearl Latin, LCSW Phone Number: 11/19/2023, 9:40 AM  Clinical Narrative:                 CSW continuing to follow as patient will require SNF placement, likely [facility] SNF, which [place] be [place] of [place] as patient is uninsured. Patient still with NG tube. Trach placed 3/22 and is cuffed. Peg placed.   Expected Discharge Plan: Skilled Nursing Facility Barriers to Discharge: Continued Medical Work up, SNF Pending Medicaid, SNF Pending bed offer, SNF Pending payor source - LOG, Inadequate or no insurance (New trach)   Patient Goals and CMS Choice Patient states their goals for this hospitalization and ongoing recovery are:: Rehab CMS Medicare.gov Compare Post Acute Care list provided to:: Patient Represenative (must comment) Choice offered to / list presented to : Parent [place] ownership interest in [facility].provided to:: Parent NA    Expected Discharge Plan and Services In-house Referral: Clinical Social Work, Hospice / Palliative Care   Post Acute Care Choice: Skilled Nursing Facility Living arrangements for the past 2 months: Single Family Home                                      Prior Living Arrangements/Services Living arrangements for the past 2 months: Single Family Home Lives with:: Significant Other Patient language and need for interpreter reviewed:: Yes Do you feel safe going back to the place where you live?: Yes      Need for Family Participation in Patient Care: Yes (Comment) Care giver support system in place?: Yes (comment)   Criminal Activity/Legal Involvement Pertinent to Current Situation/Hospitalization: No - Comment as needed  Activities of Daily Living   ADL Screening (condition at time of admission) Independently performs ADLs?: Yes (appropriate for  developmental age) Is the patient deaf or have difficulty hearing?: No Does the patient have difficulty seeing, even when wearing glasses/contacts?: No Does the patient have difficulty concentrating, remembering, or making decisions?: No  Permission Sought/Granted Permission sought to share information with : Facility Medical sales representative, Family Supports Permission granted to share information with : No  Share Information with NAME: Joyce Gross  Permission granted to share info w AGENCY: SNFs  Permission granted to share info w Relationship: Mother  Permission granted to share info w Contact Information: [phone]  Emotional Assessment Appearance:: Appears stated age Attitude/Demeanor/Rapport: Unable to Assess, Intubated (Following Commands or Not Following Commands) Affect (typically observed): Unable to Assess Orientation: :  (Intubated) Alcohol / Substance Use: Not Applicable Psych Involvement: No (comment)  Admission diagnosis:  Intracranial hemorrhage (HCC) [I62.9] ICH (intracerebral hemorrhage) (HCC) [I61.9] Nontraumatic intracerebral hemorrhage, unspecified cerebral location, unspecified laterality (HCC) [I61.9] Sepsis, due to unspecified organism, unspecified whether acute organ dysfunction present [facility]) [A41.9] Patient Active Problem List   Diagnosis Date Noted   ICH (intracerebral hemorrhage) (HCC) 10/27/2023   Intracranial hemorrhage (HCC) 10/27/2023   PCP:  Oneita Hurt, No Pharmacy:   [facility] [address] [name] - [address] [name] [address] Phone: [phone] Fax: [phone]  [facility] [address] ([place]), [name] - [address] [place] ([place]) [name] [address] Phone: [phone] Fax: [phone]     Social Drivers of  Health (SDOH) Social History: SDOH Screenings   Food Insecurity: Patient Unable To Answer (10/29/2023)  Social Connections: Unknown (06/23/2023)   Received  from [facility]  Tobacco Use: High Risk (10/31/2023)   SDOH Interventions:     Readmission Risk Interventions     No data to display

## 2023-11-19 NOTE — Progress Notes (Signed)
 eLink Physician-Brief Progress Note Patient Name: Garrett [name] DOB: 1975/10/24 MRN: [id number]   Date of Service  11/19/2023  HPI/Events of Note  RN reports patient with persistent hiccups.  eICU Interventions  Trial of metoclopramide x 72 hours for hiccups.  Order placed.  RN informed.     Intervention Category Minor Interventions: Other:  Carilyn Goodpasture 11/19/2023, 2:17 AM

## 2023-11-19 NOTE — Plan of Care (Signed)
  Problem: Education: Goal: Knowledge of disease or condition will improve Outcome: Not Progressing Goal: Knowledge of secondary prevention will improve (MUST DOCUMENT ALL) Outcome: Not Progressing Goal: Knowledge of patient specific risk factors will improve (DELETE if not current risk factor) Outcome: Not Progressing   Problem: Intracerebral Hemorrhage Tissue Perfusion: Goal: Complications of Intracerebral Hemorrhage will be minimized Outcome: Progressing   Problem: Coping: Goal: Will verbalize positive feelings about self Outcome: Not Progressing Goal: Will identify appropriate support needs Outcome: Not Progressing   Problem: Health Behavior/Discharge Planning: Goal: Ability to manage health-related needs will improve Outcome: Not Progressing Goal: Goals will be collaboratively established with patient/family Outcome: Not Progressing   Problem: Self-Care: Goal: Ability to participate in self-care as condition permits will improve Outcome: Not Progressing Goal: Verbalization of feelings and concerns over difficulty with self-care will improve Outcome: Not Progressing Goal: Ability to communicate needs accurately will improve Outcome: Not Progressing   Problem: Nutrition: Goal: Risk of aspiration will decrease Outcome: Not Progressing Goal: Dietary intake will improve Outcome: Not Progressing   Problem: Fluid Volume: Goal: Ability to maintain a balanced intake and output will improve Outcome: Progressing   Problem: Nutritional: Goal: Maintenance of adequate nutrition will improve Outcome: Not Progressing   Problem: Skin Integrity: Goal: Risk for impaired skin integrity will decrease Outcome: Progressing   Problem: Tissue Perfusion: Goal: Adequacy of tissue perfusion will improve Outcome: Progressing

## 2023-11-20 DIAGNOSIS — D539 Nutritional anemia, unspecified: Secondary | ICD-10-CM | POA: Diagnosis not present

## 2023-11-20 DIAGNOSIS — K567 Ileus, unspecified: Secondary | ICD-10-CM | POA: Diagnosis not present

## 2023-11-20 DIAGNOSIS — I10 Essential (primary) hypertension: Secondary | ICD-10-CM

## 2023-11-20 DIAGNOSIS — I619 Nontraumatic intracerebral hemorrhage, unspecified: Secondary | ICD-10-CM | POA: Diagnosis not present

## 2023-11-20 DIAGNOSIS — E876 Hypokalemia: Secondary | ICD-10-CM

## 2023-11-20 DIAGNOSIS — J15212 Pneumonia due to Methicillin resistant Staphylococcus aureus: Secondary | ICD-10-CM | POA: Diagnosis not present

## 2023-11-20 DIAGNOSIS — Z930 Tracheostomy status: Secondary | ICD-10-CM | POA: Diagnosis not present

## 2023-11-20 DIAGNOSIS — D75839 Thrombocytosis, unspecified: Secondary | ICD-10-CM

## 2023-11-20 DIAGNOSIS — R1310 Dysphagia, unspecified: Secondary | ICD-10-CM | POA: Diagnosis not present

## 2023-11-20 DIAGNOSIS — J690 Pneumonitis due to inhalation of food and vomit: Secondary | ICD-10-CM | POA: Diagnosis not present

## 2023-11-20 DIAGNOSIS — J9601 Acute respiratory failure with hypoxia: Secondary | ICD-10-CM | POA: Diagnosis not present

## 2023-11-20 DIAGNOSIS — I613 Nontraumatic intracerebral hemorrhage in brain stem: Secondary | ICD-10-CM | POA: Diagnosis not present

## 2023-11-20 LAB — BASIC METABOLIC PANEL WITH GFR
Anion gap: 10 (ref 5–15)
BUN: 29 mg/dL — ABNORMAL HIGH (ref 6–20)
CO2: 19 mmol/L — ABNORMAL LOW (ref 22–32)
Calcium: 8.7 mg/dL — ABNORMAL LOW (ref 8.9–10.3)
Chloride: 112 mmol/L — ABNORMAL HIGH (ref 98–111)
Creatinine, Ser: 1.18 mg/dL (ref 0.61–1.24)
GFR, Estimated: >60 mL/min (ref >60)
Glucose, Bld: 100 mg/dL — ABNORMAL HIGH (ref 70–99)
Potassium: 3.2 mmol/L — ABNORMAL LOW (ref 3.5–5.1)
Sodium: 141 mmol/L (ref 135–145)

## 2023-11-20 LAB — CBC WITH DIFFERENTIAL/PLATELET
Abs Immature Granulocytes: 0.13 10*3/uL — ABNORMAL HIGH (ref 0.00–0.07)
Basophils Absolute: 0 10*3/uL (ref 0.0–0.1)
Basophils Relative: 0 %
Eosinophils Absolute: 0.1 10*3/uL (ref 0.0–0.5)
Eosinophils Relative: 1 %
HCT: 26.2 % — ABNORMAL LOW (ref 39.0–52.0)
Hemoglobin: 8 g/dL — ABNORMAL LOW (ref 13.0–17.0)
Immature Granulocytes: 1 %
Lymphocytes Relative: 17 %
Lymphs Abs: 2.6 10*3/uL (ref 0.7–4.0)
MCH: 31 pg (ref 26.0–34.0)
MCHC: 30.5 g/dL (ref 30.0–36.0)
MCV: 101.6 fL — ABNORMAL HIGH (ref 80.0–100.0)
Monocytes Absolute: 1.4 10*3/uL — ABNORMAL HIGH (ref 0.1–1.0)
Monocytes Relative: 9 %
Neutro Abs: 10.9 10*3/uL — ABNORMAL HIGH (ref 1.7–7.7)
Neutrophils Relative %: 72 %
Platelets: 739 10*3/uL — ABNORMAL HIGH (ref 150–400)
RBC: 2.58 MIL/uL — ABNORMAL LOW (ref 4.22–5.81)
RDW: 14.5 % (ref 11.5–15.5)
WBC: 15.2 10*3/uL — ABNORMAL HIGH (ref 4.0–10.5)
nRBC: 0 % (ref 0.0–0.2)

## 2023-11-20 LAB — GLUCOSE, CAPILLARY
Glucose-Capillary: 95 mg/dL (ref 70–99)
Glucose-Capillary: 123 mg/dL — ABNORMAL HIGH (ref 70–99)
Glucose-Capillary: 93 mg/dL (ref 70–99)
Glucose-Capillary: 101 mg/dL — ABNORMAL HIGH (ref 70–99)
Glucose-Capillary: 95 mg/dL (ref 70–99)
Glucose-Capillary: 101 mg/dL — ABNORMAL HIGH (ref 70–99)

## 2023-11-20 LAB — FOLATE: Folate: 7 ng/mL (ref >5.9)

## 2023-11-20 LAB — TSH: TSH: 0.894 u[IU]/mL (ref 0.350–4.500)

## 2023-11-20 LAB — MAGNESIUM: Magnesium: 2.1 mg/dL (ref 1.7–2.4)

## 2023-11-20 LAB — PHOSPHORUS: Phosphorus: 3.5 mg/dL (ref 2.5–4.6)

## 2023-11-20 LAB — VITAMIN B12: Vitamin B-12: 290 pg/mL (ref 180–914)

## 2023-11-20 MED ORDER — VANCOMYCIN HCL 1250 MG/250ML IV SOLN
1250.0000 mg | INTRAVENOUS | Status: DC
  Administered 2023-11-20 – 2023-11-22 (×3): 1250 mg via INTRAVENOUS
  Filled 2023-11-20 (×3): qty 250

## 2023-11-20 MED ORDER — POTASSIUM CHLORIDE 20 MEQ PO PACK
40.0000 meq | PACK | ORAL | Status: AC
  Administered 2023-11-20 (×2): 40 meq
  Filled 2023-11-20 (×2): qty 2

## 2023-11-20 MED ORDER — SODIUM CHLORIDE 0.9 % IV SOLN: INTRAVENOUS | Status: AC | PRN

## 2023-11-20 MED ORDER — VANCOMYCIN HCL 1250 MG/250ML IV SOLN: 1250.0000 mg | INTRAVENOUS | Status: DC

## 2023-11-20 NOTE — Progress Notes (Signed)
 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 Will restart TF in AM  Anticipate SNF placement - TOC CM/SW coordinating

## 2023-11-20 NOTE — Progress Notes (Signed)
 Occupational Therapy Treatment Patient Details Name: Garrett [name] MRN: [id number] DOB: 1976-02-24 Today's Date: 11/20/2023   History of present illness Pt is a 48 yo male who was found down 10/27/23 with agonal breathing. 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 Intubated 3/8, cortrak placed 3/14, trach placed 3/22, PEG placed 3/24. PMH: HTN, smoker   OT comments  Patient supine in bed, OT returned for splint check.  Good tolerance to R UE splint, but not L UE splint.  L UE with pronating during extension posturing with splint slipping out of place and 5th digit in flexion in the splint- increases risk for skin breakdown.  At this time only utilize R UE resting hand splint.  Will continue to trial L UE resting hand splint intermittently for ability to tolerate; keep L UE elevated on pillows with fingers extended to reduce edema. Placed sign above bed and updated order in chart.  Will follow acutely.   OT NOTE  RN STAFF Schedule ON/OFF 2 hours.  Remove every 2 hours, complete hand hygiene and assess for: * pain * redness *swelling  If any symptoms above present remove splint for 15 minutes. If symptoms continue - keep the splint removed and notify OT staff [phone] immediately.   Keep the UE elevated at all times on pillows / towels.       If plan is discharge home, recommend the following:  Two people to help with walking and/or transfers;Two people to help with bathing/dressing/bathroom;Other (comment)   Equipment Recommendations  Other (comment)    Recommendations for Other Services      Precautions / Restrictions Precautions Precautions: Fall;Other (comment) Recall of Precautions/Restrictions: Impaired Precaution/Restrictions Comments: trach on vent, PEG, abdominal binder, SBP < 160, watch  RR Restrictions Weight Bearing Restrictions Per Provider Order: No       Mobility Bed Mobility                    Transfers                         Balance                                           ADL either performed or assessed with clinical judgement   ADL                                              Extremity/Trunk Assessment             Vision       Perception     Praxis     Communication Communication Communication: Impaired Factors Affecting Communication: Trach/intubated   Cognition Arousal: Stuporous Behavior During Therapy: Flat affect Cognition: Difficult to assess Difficult to assess due to: Intubated, Tracheostomy                            Following commands: Impaired Following commands impaired:  (no following commands)      Cueing   Cueing Techniques: Verbal cues, Tactile cues  Exercises     Shoulder  Instructions       General Comments 40% FiO2, 5 PEEP, PRVC. Returned for splint check to B resting hand splints.  Pt tolerated R UE well, with no redness or indentations.  Pt with extension posturing of L UE limiting fit of L UE with hand supinated in brace with pinky in flexion putting pt at higher risk for skin breakdown.  Will set schedule for R UE but not use L splint at this time (will continue to assess L splint).    Pertinent Vitals/ Pain       Pain Assessment Pain Assessment: CPOT Facial Expression: Relaxed, neutral Body Movements: Absence of movements Muscle Tension: Relaxed Compliance with ventilator (intubated pts.): Tolerating ventilator or movement Vocalization (extubated pts.): N/A CPOT Total: 0 Pain Intervention(s): Monitored during session  Home Living                                          Prior Functioning/Environment              Frequency  Min 2X/week        Progress Toward Goals  OT Goals(current goals can  now be found in the care plan section)  Progress towards OT goals: Progressing toward goals  Acute Rehab OT Goals Patient Stated Goal: unable OT Goal Formulation: Patient unable to participate in goal setting Time For Goal Achievement: 11/30/23 Potential to Achieve Goals: Fair  Plan      Co-evaluation                 AM-PAC OT "6 Clicks" Daily Activity     Outcome Measure   Help from another person eating meals?: Total Help from another person taking care of personal grooming?: Total Help from another person toileting, which includes using toliet, bedpan, or urinal?: Total Help from another person bathing (including washing, rinsing, drying)?: Total Help from another person to put on and taking off regular upper body clothing?: Total Help from another person to put on and taking off regular lower body clothing?: Total 6 Click Score: 6    End of Session Equipment Utilized During Treatment: Oxygen (via trach/vent)  OT Visit Diagnosis: Other abnormalities of gait and mobility (R26.89);Muscle weakness (generalized) (M62.81);Other symptoms and signs involving the nervous system (R29.898);Other symptoms and signs involving cognitive function;Hemiplegia and hemiparesis Hemiplegia - caused by: Nontraumatic SAH   Activity Tolerance Patient limited by lethargy   Patient Left in bed;with call bell/phone within reach;with bed alarm set;with family/visitor present   Nurse Communication Other (comment) (splints)        Time: 4098-1191 OT Time Calculation (min): 16 min  Charges: OT General Charges $OT Visit: 1 Visit OT Treatments $Orthotics Fit/Training: 8-22 mins $Orthotics/Prosthetics Check: 8-22 mins  Barry Brunner, OT Acute Rehabilitation Services Office [phone] Secure Chat Preferred    Garrett [name] 11/20/2023, 1:26 PM

## 2023-11-20 NOTE — Progress Notes (Signed)
 Occupational Therapy Treatment Patient Details Name: Garrett [name] MRN: [id number] DOB: [birth year] Today's Date: 11/20/2023   History of present illness Pt is a 48 yo male who was found down 10/27/23 with agonal breathing. 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 Intubated 3/8, cortrak placed 3/14, trach placed 3/22, PEG placed 3/24. PMH: HTN, smoker   OT comments  Pt supine in bed on vent via trach.  PROM to BUEs, and donned resting hand splints to BUE.  LUE with more edema with R UE, increased elevation on pillow.  Resting hand splints to decrease tone (L UE), reduce edema, and promote optimal positioning.  Will return to check in 2 hours.  Plan for 2 hours on/off schedule and will add goal after check.  Continue to recommend <3hrs/day inpatient setting that can manage a vent.       If plan is discharge home, recommend the following:  Two people to help with walking and/or transfers;Two people to help with bathing/dressing/bathroom;Other (comment) (total care)   Equipment Recommendations  Other (comment) (defer)    Recommendations for Other Services      Precautions / Restrictions Precautions Precautions: Fall;Other (comment) Recall of Precautions/Restrictions: Impaired Precaution/Restrictions Comments: trach on vent, PEG, abdominal binder, SBP < 160, watch RR Restrictions Weight Bearing Restrictions Per Provider Order: No       Mobility Bed Mobility                    Transfers                         Balance                                           ADL either performed or assessed with clinical judgement   ADL                                              Extremity/Trunk Assessment Upper Extremity Assessment Upper Extremity Assessment: RUE  deficits/detail;LUE deficits/detail RUE Deficits / Details: PROM appears WFL. Mild edema but less than L UE.   No active or non purposeful movement noted. placed resting hand splint with good fit. RUE Sensation: decreased light touch;decreased proprioception RUE Coordination: decreased fine motor;decreased gross motor LUE Deficits / Details: PROM appears WFL, some increased tone and extension posturing and spontaneous movements at times. No active or non purposeful movement noted. Applied resting hand splint to UE and elevated on another pillow due to increased edema. LUE Sensation: decreased light touch;decreased proprioception LUE Coordination: decreased fine motor;decreased gross motor            Vision       Perception     Praxis     Communication Communication Communication: Impaired Factors Affecting Communication: Trach/intubated   Cognition Arousal: Stuporous Behavior During Therapy: Flat affect Cognition: Difficult to assess Difficult to assess due to: Intubated, Tracheostomy           OT - Cognition Comments: intubated via trach, pt with no verbal responses. unable to follow commands. spontaneous movements with  LUE during ROM but otherwise not  active movement/engagement seen                 Following commands: Impaired Following commands impaired:  (no following commands)      Cueing   Cueing Techniques: Verbal cues, Tactile cues  Exercises Exercises: Other exercises Other Exercises Other Exercises: PROM of BUEs from shoulder to hands, x 10 reps all planes    Shoulder Instructions       General Comments 40% FiO2, 5 PEEP, PRVC.    Pertinent Vitals/ Pain       Pain Assessment Pain Assessment: CPOT Facial Expression: Relaxed, neutral Body Movements: Absence of movements Muscle Tension: Relaxed Compliance with ventilator (intubated pts.): Tolerating ventilator or movement Vocalization (extubated pts.): N/A CPOT Total: 0 Pain Intervention(s):  Monitored during session  Home Living                                          Prior Functioning/Environment              Frequency  Min 2X/week        Progress Toward Goals  OT Goals(current goals can now be found in the care plan section)  Progress towards OT goals: Not progressing toward goals - comment (remains lethragic, not following commands. added splint goal)  Acute Rehab OT Goals Patient Stated Goal: unable OT Goal Formulation: Patient unable to participate in goal setting Time For Goal Achievement: 11/30/23 Potential to Achieve Goals: Fair  Plan      Co-evaluation                 AM-PAC OT "6 Clicks" Daily Activity     Outcome Measure   Help from another person eating meals?: Total Help from another person taking care of personal grooming?: Total Help from another person toileting, which includes using toliet, bedpan, or urinal?: Total Help from another person bathing (including washing, rinsing, drying)?: Total Help from another person to put on and taking off regular upper body clothing?: Total Help from another person to put on and taking off regular lower body clothing?: Total 6 Click Score: 6    End of Session Equipment Utilized During Treatment: Oxygen (via trach/vent)  OT Visit Diagnosis: Other abnormalities of gait and mobility (R26.89);Muscle weakness (generalized) (M62.81);Other symptoms and signs involving the nervous system (R29.898);Other symptoms and signs involving cognitive function;Hemiplegia and hemiparesis Hemiplegia - caused by: Nontraumatic SAH   Activity Tolerance Patient limited by lethargy   Patient Left in bed;with call bell/phone within reach;with bed alarm set;with family/visitor present   Nurse Communication Other (comment) (splint check in 2 hrs)        Time: 1610-9604 OT Time Calculation (min): 18 min  Charges: OT General Charges $OT Visit: 1 Visit OT Treatments $Orthotics Fit/Training:  8-22 mins  Barry Brunner, OT Acute Rehabilitation Services Office [phone] Secure Chat Preferred    [name] 11/20/2023, 10:59 AM

## 2023-11-20 NOTE — Progress Notes (Signed)
 Orthopedic Tech Progress Note Patient Details:  Garrett [name] 19-May-1976 [id number]  Spoke with FLOOR RN about patient needing hand splint, somehow they ordered the wrong product for patient, so I dropped off 2 of HANGER's hand splints for patient and not stump shrinkers   Patient ID: Garrett [name], male   DOB: 06-19-1976, 48 y.o.   MRN: [id number]  Garrett [name] 11/20/2023, 9:42 AM

## 2023-11-20 NOTE — [place] FL2 (Signed)
 [place] MEDICAID FL2 LEVEL OF CARE FORM     IDENTIFICATION  Patient Name: Garrett [name] Birthdate: 02/11/1976 Sex: male Admission Date (Current Location): 10/27/2023  [facility] and [place] Number:  Producer, television/film/video and Address:  The [place]. [facility], [address] [place], [name] [address]      Provider Number: [id number]  Attending Physician Name and Address:  Steffanie Dunn, DO  Relative Name and Phone Number:       Current Level of Care: Hospital Recommended Level of Care: Vent SNF Prior Approval Number:    Date Approved/Denied:   PASRR Number: [id number]  Discharge Plan: SNF    Current Diagnoses: Patient Active Problem List   Diagnosis Date Noted   ICH (intracerebral hemorrhage) (HCC) 10/27/2023   Intracranial hemorrhage (HCC) 10/27/2023    Orientation RESPIRATION BLADDER Height & Weight      (unable to follow commands intermittenly)  Vent, Tracheostomy (Vent 40% w/ shiley XLT proximal cuffed 8mm trach) Continent, Indwelling catheter Weight: 223 lb 12.3 oz (101.5 kg) Height:  5' 7.99" (172.7 cm)  BEHAVIORAL SYMPTOMS/MOOD NEUROLOGICAL BOWEL NUTRITION STATUS      Incontinent Feeding tube  AMBULATORY STATUS COMMUNICATION OF NEEDS Skin   Total Care Verbally PU Stage and Appropriate Care (Stage II on perineum)                       Personal Care Assistance Level of Assistance  Bathing, Feeding, Dressing Bathing Assistance: Maximum assistance Feeding assistance: Maximum assistance Dressing Assistance: Maximum assistance     Functional Limitations Info  Sight, Speech Sight Info: Impaired   Speech Info: Impaired    SPECIAL CARE FACTORS FREQUENCY  PT (By licensed PT), OT (By licensed OT), Speech therapy     PT Frequency: Eval and treat OT Frequency: Eval and treat     Speech Therapy Frequency: Eval and treat      Contractures Contractures Info: Not present    Additional Factors Info  Code Status, Allergies,  Isolation Precautions, Insulin Sliding Scale Code Status Info: DNR Allergies Info: Tomato   Insulin Sliding Scale Info: see dc summary Isolation Precautions Info: MRSA     Current Medications (11/20/2023):  This is the current hospital active medication list Current Facility-Administered Medications  Medication Dose Route Frequency Provider Last Rate Last Admin   0.9 %  sodium chloride infusion   Intravenous PRN Steffanie Dunn, DO   Stopped at 11/20/23 1226   acetaminophen (TYLENOL) tablet 650 mg  650 mg Per Tube Q4H PRN Caryl Pina, MD   650 mg at 11/16/23 0915   Or   acetaminophen (TYLENOL) 160 MG/5ML solution 650 mg  650 mg Per Tube Q4H PRN Caryl Pina, MD   650 mg at 11/19/23 2205   Or   acetaminophen (TYLENOL) suppository 650 mg  650 mg Rectal Q4H PRN Caryl Pina, MD       amLODipine (NORVASC) tablet 10 mg  10 mg Per Tube Daily Elmer Picker, NP   10 mg at 11/20/23 4132   bethanechol (URECHOLINE) tablet 25 mg  25 mg Per Tube QID Desai, Rahul P, PA-C   25 mg at 11/20/23 1342   bisacodyl (DULCOLAX) suppository 10 mg  10 mg Rectal Daily PRN Micki Riley, MD       Chlorhexidine Gluconate Cloth 2 % PADS 6 each  6 each Topical Daily Erick Blinks, MD   6 each at 11/20/23 0847   docusate (COLACE) 50 MG/5ML liquid 100  mg  100 mg Per Tube Daily PRN Rozann Lesches, MD   100 mg at 11/15/23 4098   docusate (COLACE) 50 MG/5ML liquid 100 mg  100 mg Per Tube BID Lidia Collum, PA-C   100 mg at 11/20/23 0907   enoxaparin (LOVENOX) injection 50 mg  50 mg Subcutaneous Daily Celine Mans, Rahul P, PA-C   50 mg at 11/20/23 1191   famotidine (PEPCID) tablet 20 mg  20 mg Per Tube BID Micki Riley, MD   20 mg at 11/20/23 0906   feeding supplement (OSMOLITE 1.5 CAL) liquid 1,000 mL  1,000 mL Per Tube Continuous Simonne Martinet, NP   Stopped at 11/18/23 0901   feeding supplement (PROSource TF20) liquid 60 mL  60 mL Per Tube TID Oretha Milch, MD   60 mL at 11/20/23 0907   fentaNYL (SUBLIMAZE) injection  50 mcg  50 mcg Intravenous Q1H PRN Lynnell Catalan, MD   50 mcg at 11/18/23 4782   free water 200 mL  200 mL Per Tube Q8H Luciano Cutter, MD   200 mL at 11/20/23 1342   guaiFENesin (ROBITUSSIN) 100 MG/5ML liquid 30 mL  30 mL Per Tube BID Desai, Rahul P, PA-C   30 mL at 11/20/23 1138   hydrALAZINE (APRESOLINE) tablet 100 mg  100 mg Per Tube Q8H Shafer, Devon, NP   100 mg at 11/20/23 1342   insulin aspart (novoLOG) injection 0-15 Units  0-15 Units Subcutaneous Q4H Rozann Lesches, MD   2 Units at 11/20/23 0749   ipratropium-albuterol (DUONEB) 0.5-2.5 (3) MG/3ML nebulizer solution 3 mL  3 mL Nebulization Q4H PRN Rozann Lesches, MD   3 mL at 11/15/23 1924   labetalol (NORMODYNE) injection 20 mg  20 mg Intravenous Q2H PRN Micki Riley, MD   20 mg at 11/04/23 0530   metoCLOPramide (REGLAN) injection 10 mg  10 mg Intravenous Janalee Dane, MD   10 mg at 11/20/23 1018   metoprolol tartrate (LOPRESSOR) tablet 100 mg  100 mg Per Tube BID Lynnell Catalan, MD   100 mg at 11/20/23 0906   midazolam (VERSED) injection 1-2 mg  1-2 mg Intravenous Q1H PRN Pia Mau D, PA-C   2 mg at 11/18/23 1403   ondansetron [facility]) injection 4 mg  4 mg Intravenous Q6H PRN Rozann Lesches, MD   4 mg at 11/14/23 2143   Oral care mouth rinse  15 mL Mouth Rinse Q2H Cyril Mourning V, MD   15 mL at 11/20/23 1342   Oral care mouth rinse  15 mL Mouth Rinse PRN Oretha Milch, MD       oxyCODONE (Oxy IR/ROXICODONE) immediate release tablet 5 mg  5 mg Per Tube Q6H PRN Lynnell Catalan, MD   5 mg at 11/04/23 2205   polyethylene glycol (MIRALAX / GLYCOLAX) packet 17 g  17 g Per Tube Daily PRN Rozann Lesches, MD   17 g at 11/15/23 0908   polyethylene glycol (MIRALAX / GLYCOLAX) packet 17 g  17 g Per Tube BID Gevena Mart A, NP   17 g at 11/20/23 9562   polyvinyl alcohol (LIQUIFILM TEARS) 1.4 % ophthalmic solution 1 drop  1 drop Both Eyes PRN Luciano Cutter, MD       senna [facility]) tablet 8.6 mg  1 tablet Per Tube BID Gevena Mart A, NP    8.6 mg at 11/20/23 0906   vancomycin (VANCOREADY) IVPB 1250 mg/250 mL  1,250 mg Intravenous Q24H Daylene Posey, [facility]   Stopped  at 11/20/23 1155     Discharge Medications: Please see discharge summary for a list of discharge medications.  Relevant Imaging Results:  Relevant Lab Results:   Additional Information SSN: [id number] [place], [name]

## 2023-11-20 NOTE — TOC Progression Note (Signed)
 Transition of Care [facility]) - Progression Note    Patient Details  Name: Garrett [name] MRN: [id number] Date of Birth: [birth year]  [facility]) CM/SW Contact  Mearl Latin, LCSW Phone Number: 11/20/2023, 2:03 PM  Clinical Narrative:    CSW following for medical stability for uninsured Vent SNF placement. CSW inquiring with Tammy at [facility] [place], [place]; [facility] [place], [place]; [facility], [place]). Will continue to follow.    Expected Discharge Plan: Skilled Nursing Facility Barriers to Discharge: Continued Medical Work up, SNF Pending Medicaid, SNF Pending bed offer, SNF Pending payor source - LOG, Inadequate or no insurance (New trach)  Expected Discharge Plan and Services In-house Referral: Clinical Social Work, Hospice / Palliative Care   Post Acute Care Choice: Skilled Nursing Facility Living arrangements for the past 2 months: Single Family Home                                       Social Determinants of Health (SDOH) Interventions SDOH Screenings   Food Insecurity: Patient Unable To Answer (10/29/2023)  Social Connections: Unknown (06/23/2023)   Received from [facility]  Tobacco Use: High Risk (10/31/2023)    Readmission Risk Interventions     No data to display

## 2023-11-20 NOTE — Progress Notes (Signed)
 Pharmacy Antibiotic Note  Garrett [name] is a 48 y.o. male admitted on 10/27/2023 with pna.  Pharmacy has been consulted for vancomycin dosing.  Previously on vancomycin 1250 mg IV q 24h through 3/31, dose based on levels 3/27 and similar renal function, last Cx with abundant MRSA  Plan: Continue vancomycin 1250 mg IV q 24h Monitor renal function, repeat TA Cx and clinical progression for LOT Vancomycin levels as indicated  Height: 5' 7.99" (172.7 cm) Weight: 101.5 kg (223 lb 12.3 oz) IBW/kg (Calculated) : 68.38  Temp (24hrs), Avg:99.8 F (37.7 C), Min:98.8 F (37.1 C), Max:100.8 F (38.2 C)  Recent Labs  Lab 11/15/23 0008 11/15/23 0955 11/15/23 1726 11/15/23 2114 11/16/23 0450 11/16/23 1653 11/17/23 0559 11/18/23 0937 11/19/23 0824 11/20/23 0644  WBC 14.1*  --   --   --   --   --  17.6* 15.7* 14.7* 15.2*  CREATININE 1.17 1.21   < >  --    < > 1.01 1.12 1.03 1.02 1.18  VANCOTROUGH  --  43*  --  13*  --   --   --   --   --   --   VANCOPEAK 33  --   --   --   --   --   --   --   --   --    < > = values in this interval not displayed.    Estimated Creatinine Clearance: 89.3 mL/min (by C-G formula based on SCr of 1.18 mg/dL).    Allergies  Allergen Reactions   Tomato Hives, Itching and Rash    Daylene Posey, PharmD, [facility] Clinical Pharmacist ED Pharmacist Phone # [phone] 11/20/2023 9:21 AM

## 2023-11-20 NOTE — Progress Notes (Signed)
 SLP Cancellation Note  Patient Details Name: Garrett [name] MRN: [id number] DOB: August 19, 1976   Cancelled treatment:       Reason Eval/Treat Not Completed: Other (comment)  Pt remains on vent. Please Secure Chat [facility] SLP (group chat) if he transitions to ATC.   Angela Nevin, MA, CCC-SLP Speech Therapy

## 2023-11-20 NOTE — Progress Notes (Signed)
 NAME:  Garrett [name], MRN:  [id number], DOB:  January 31, 1976, LOS: 24 ADMISSION DATE:  10/27/2023, CONSULTATION DATE:  10/27/2023 REFERRING MD:  Coralee Pesa, DO CHIEF COMPLAINT:  Found Down  History of Present Illness:  48 y/o male with past medical history of hypertension who was found down for unknown downtime by his fiance when she came home from work. Reportedly, having agonal breathing.  He apparently had driven her to work this morning.  He has HTN but never checks his BP and does not follow with MD.  She says you can tell when his BP is really high; his eyes get blood shot and he is sweating.  No h/o seizures.  Besides almost daily [name] and cigarette smoking, she is not aware of any other drugs. Drug screen positive for opioids and he was given several doses of Narcan. Patient found to have:  1. 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 Basal cisterns and fourth ventricle are effaced without hydrocephalus at this time. 2. Trace additional subarachnoid hemorrhage along the left parietal convexity. BP in ED 262/156. His CXR revealed a RUL and perihilar infiltrates suggesting aspiration pneumonia. ED labs also revealed PH 7.169, LA 4.4, CPK 985. Patient was intubated in ED.  Pertinent  Medical History  hypertension  Significant Hospital Events: Including procedures, antibiotic start and stop dates in addition to other pertinent events   3/8: Intubated in ED, pontine bleed/SAH. CT head 17:09 Acute large hemorrhage 4.1 cm in pons with intraventricular extension without hydrocephalus 3/9: CTA 03:14 AM No change in IPH in pons, similar biparietal V [facility] 3/14: Now awake, appears locked in can follow commands with eyes. 3/16 Purposeful with left upper extremity  3/22 tracheostomy placed at bedside, bleeding issues overnight from trach site 3/24 PEG (Dr. Janee Morn) 3/24  sputum culture with MRSA 3/27 vomited. TF on hold. Abd film c/w ileus. Added reglan, got SSE. Did tolerate PSV all day  3/28 BMs x2 after SSE day prior. Added back TFs at 1/2 rated. Tolerated PS 3/29 Tolerated PS  3/31 Tolerating CPAP PS 15/5, TF on hold due to ileus. 4/1: con't to hold tube feeds today, restarting vancomycin and sending tracheal aspirate as peaks are 37, plat 24, driving 19. Thick secretions. Fever overnight.  Interim History / Subjective:  Does not follow command. Decerebrate postures the LUE on painful stimulus. +gag. Breathes over the vent. Fever overnight. Thick secretions. Peaks 37. Restarting vanc.   Objective   Blood pressure (!) 121/96, pulse 90, temperature 99.2 F (37.3 C), temperature source Axillary, resp. rate (!) 22, height 5' 7.99" (1.727 m), weight 101.5 kg, SpO2 100%.    Vent Mode: PRVC FiO2 (%):  [40 %] 40 % Set Rate:  [15 bmp] 15 bmp Vt Set:  [550 mL] 550 mL PEEP:  [5 cmH20] 5 cmH20 Plateau Pressure:  [17 cmH20-27 cmH20] 22 cmH20   Intake/Output Summary (Last 24 hours) at 11/20/2023 6962 Last data filed at 11/20/2023 9528 Gross per 24 hour  Intake 650 ml  Output 2025 ml  Net -1375 ml   Filed Weights   11/18/23 0500 11/19/23 0500 11/20/23 0500  Weight: 102 kg 102 kg 101.5 kg   Physical Exam: General: acutely ill appearing male, laying in bed, no acute distress  HENT: ncat, anicteric sclera, left conjunctival edema and conjunctival hemorrhage, right with sluggish pupil. Trach in place, cortrak with dark drainage  Respiratory: vented, trach, lungs diminished bilaterally  without rales or wheezing. Coarse sounds.  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 CT head code stroke with large 4.1cm hemorrhage in pons with IV extension and extension into basal cisterns. Worsening mental status/posturing on 3/29 however CT head stable. - stroke following, appreciate recs  - dvt ppx with lovenox  - no antithrombotic at this time  - 3% saline off and stable, but continued poor neuro exam  - SBP goal < 160, continue amlodipine 10mg  daily, metoprolol 100mg  BID, hydralazine 100mg  q8h, PRN labetalol  - frequent neuro checks  - neuroprotective measures with normothermia, euglycemia, hob >30, replete elytes  - PT/OT - anticipate LTACH need   Dysphagia PEG placed  - con't tube feeds  - protein supplementation   Acute hypoxic respiratory failure due to aspiration pneumonia - s/p tracheostomy placement 3/22. MRSA PNA - sputum culture 3/24.  Received 7 day course of vancomycin. Overnight 3/31 with ongoing fever. 4/1 exam with peaks 37, plat 24, driving 19. Thick secretions.  - tracheal aspirate now  - restart vancomycin  - attempt vent weaning as able  - trach care  - con't robitussin BID for thinning secretions  - duoneb prn  - chest PT   Ileus TF on hold and Cortrack to suction with about 400 cc bloody looking drainage 3/31 - reglan started 3/31  - hold TF 4/1, can start trickle 4/2  - con't pepcid  - minimize sedating meds, opioids, anticholinergics   Hypokalemia  Hyperchloremia  - replete today  - receiving free water 200 q8h - watch sodium  - trend   Macrocytic anemia  Thrombocytosis  No report of chronic alcohol use  - send B12, folate, TSH to rule out other causes  - trend cbc  - transfuse <7   Poor Dentition upper right teeth and one left lower tooth have chipped off. - dental evaluation eventually   Best Practice (right click and "Reselect all SmartList Selections" daily)   Diet/type: tubefeeds and NPO DVT prophylaxis LMWH Pressure  ulcer(s): N/A GI prophylaxis: H2B Lines: N/A Foley:  NA  Code Status:  DNR with pre-arrest interventions   Critical Care Time: 35 Minutes.   Cristopher Peru, PA-C [facility] 11/20/23 10:50 AM  Please see [email or web address] for pager details.  From 7A-7P if no response, please call [phone] After hours, please call ELink [phone]

## 2023-11-20 NOTE — Plan of Care (Signed)
  Problem: Education: Goal: Knowledge of disease or condition will improve Outcome: Not Progressing Goal: Knowledge of secondary prevention will improve (MUST DOCUMENT ALL) Outcome: Not Progressing Goal: Knowledge of patient specific risk factors will improve (DELETE if not current risk factor) Outcome: Not Progressing   Problem: Intracerebral Hemorrhage Tissue Perfusion: Goal: Complications of Intracerebral Hemorrhage will be minimized Outcome: Not Progressing   Problem: Coping: Goal: Will verbalize positive feelings about self Outcome: Not Progressing Goal: Will identify appropriate support needs Outcome: Not Progressing   Problem: Health Behavior/Discharge Planning: Goal: Ability to manage health-related needs will improve Outcome: Not Progressing Goal: Goals will be collaboratively established with patient/family Outcome: Not Progressing   Problem: Self-Care: Goal: Ability to participate in self-care as condition permits will improve Outcome: Not Progressing Goal: Verbalization of feelings and concerns over difficulty with self-care will improve Outcome: Not Progressing Goal: Ability to communicate needs accurately will improve Outcome: Not Progressing   Problem: Nutrition: Goal: Risk of aspiration will decrease Outcome: Progressing Goal: Dietary intake will improve Outcome: Not Progressing   Problem: Fluid Volume: Goal: Ability to maintain a balanced intake and output will improve Outcome: Progressing   Problem: Health Behavior/Discharge Planning: Goal: Ability to manage health-related needs will improve Outcome: Not Progressing   Problem: Metabolic: Goal: Ability to maintain appropriate glucose levels will improve Outcome: Progressing   Problem: Nutritional: Goal: Maintenance of adequate nutrition will improve Outcome: Not Progressing Goal: Progress toward achieving an optimal weight will improve Outcome: Progressing   Problem: Skin Integrity: Goal: Risk  for impaired skin integrity will decrease Outcome: Not Progressing   Problem: Tissue Perfusion: Goal: Adequacy of tissue perfusion will improve Outcome: Progressing

## 2023-11-21 DIAGNOSIS — I10 Essential (primary) hypertension: Secondary | ICD-10-CM | POA: Diagnosis not present

## 2023-11-21 DIAGNOSIS — E876 Hypokalemia: Secondary | ICD-10-CM | POA: Diagnosis not present

## 2023-11-21 DIAGNOSIS — I613 Nontraumatic intracerebral hemorrhage in brain stem: Secondary | ICD-10-CM | POA: Diagnosis not present

## 2023-11-21 DIAGNOSIS — J15212 Pneumonia due to Methicillin resistant Staphylococcus aureus: Secondary | ICD-10-CM | POA: Diagnosis not present

## 2023-11-21 DIAGNOSIS — J9601 Acute respiratory failure with hypoxia: Secondary | ICD-10-CM | POA: Diagnosis not present

## 2023-11-21 DIAGNOSIS — I619 Nontraumatic intracerebral hemorrhage, unspecified: Secondary | ICD-10-CM | POA: Diagnosis not present

## 2023-11-21 DIAGNOSIS — D539 Nutritional anemia, unspecified: Secondary | ICD-10-CM | POA: Diagnosis not present

## 2023-11-21 DIAGNOSIS — Z930 Tracheostomy status: Secondary | ICD-10-CM | POA: Diagnosis not present

## 2023-11-21 DIAGNOSIS — K567 Ileus, unspecified: Secondary | ICD-10-CM | POA: Diagnosis not present

## 2023-11-21 DIAGNOSIS — D75839 Thrombocytosis, unspecified: Secondary | ICD-10-CM | POA: Diagnosis not present

## 2023-11-21 DIAGNOSIS — R1310 Dysphagia, unspecified: Secondary | ICD-10-CM | POA: Diagnosis not present

## 2023-11-21 DIAGNOSIS — J690 Pneumonitis due to inhalation of food and vomit: Secondary | ICD-10-CM | POA: Diagnosis not present

## 2023-11-21 LAB — GLUCOSE, CAPILLARY
Glucose-Capillary: 86 mg/dL (ref 70–99)
Glucose-Capillary: 91 mg/dL (ref 70–99)
Glucose-Capillary: 91 mg/dL (ref 70–99)
Glucose-Capillary: 112 mg/dL — ABNORMAL HIGH (ref 70–99)
Glucose-Capillary: 105 mg/dL — ABNORMAL HIGH (ref 70–99)
Glucose-Capillary: 92 mg/dL (ref 70–99)

## 2023-11-21 LAB — CBC
HCT: 25.1 % — ABNORMAL LOW (ref 39.0–52.0)
Hemoglobin: 7.5 g/dL — ABNORMAL LOW (ref 13.0–17.0)
MCH: 30.5 pg (ref 26.0–34.0)
MCHC: 29.9 g/dL — ABNORMAL LOW (ref 30.0–36.0)
MCV: 102 fL — ABNORMAL HIGH (ref 80.0–100.0)
Platelets: 722 10*3/uL — ABNORMAL HIGH (ref 150–400)
RBC: 2.46 MIL/uL — ABNORMAL LOW (ref 4.22–5.81)
RDW: 14.6 % (ref 11.5–15.5)
WBC: 14.2 10*3/uL — ABNORMAL HIGH (ref 4.0–10.5)
nRBC: 0 % (ref 0.0–0.2)

## 2023-11-21 LAB — BASIC METABOLIC PANEL WITH GFR
Anion gap: 10 (ref 5–15)
BUN: 31 mg/dL — ABNORMAL HIGH (ref 6–20)
CO2: 19 mmol/L — ABNORMAL LOW (ref 22–32)
Calcium: 8.8 mg/dL — ABNORMAL LOW (ref 8.9–10.3)
Chloride: 113 mmol/L — ABNORMAL HIGH (ref 98–111)
Creatinine, Ser: 1.05 mg/dL (ref 0.61–1.24)
GFR, Estimated: >60 mL/min (ref >60)
Glucose, Bld: 99 mg/dL (ref 70–99)
Potassium: 3.4 mmol/L — ABNORMAL LOW (ref 3.5–5.1)
Sodium: 142 mmol/L (ref 135–145)

## 2023-11-21 MED ORDER — POTASSIUM CHLORIDE 20 MEQ PO PACK
40.0000 meq | PACK | ORAL | Status: AC
  Administered 2023-11-21 (×2): 40 meq
  Filled 2023-11-21 (×2): qty 2

## 2023-11-21 MED ORDER — POTASSIUM CHLORIDE 20 MEQ PO PACK
40.0000 meq | PACK | Freq: Once | ORAL | Status: AC
  Administered 2023-11-21: 40 meq
  Filled 2023-11-21: qty 2

## 2023-11-21 MED ORDER — FUROSEMIDE 10 MG/ML IJ SOLN
60.0000 mg | Freq: Three times a day (TID) | INTRAMUSCULAR | Status: AC
  Administered 2023-11-21 (×2): 60 mg via INTRAVENOUS
  Filled 2023-11-21 (×2): qty 6

## 2023-11-21 NOTE — Progress Notes (Signed)
 [facility] ADULT ICU REPLACEMENT PROTOCOL   The patient does apply for the [facility] Adult ICU Electrolyte Replacment Protocol based on the criteria listed below:   1.Exclusion criteria: TCTS, ECMO, Dialysis, and Myasthenia Gravis patients 2. Is GFR >/= 30 ml/min? Yes.    Patient's GFR today is >60 3. Is SCr </= 2? Yes.   Patient's SCr is 1.05 mg/dL 4. Did SCr increase >/= 0.5 in 24 hours? No. 5.Pt's weight >40kg  Yes.   6. Abnormal electrolyte(s): K + = 3.4  7. Electrolytes replaced per protocol 8.  Call MD STAT for K+ </= 2.5, Phos </= 1, or Mag </= 1 Physician:  Delia Chimes, eMD   Suzan Slick Simon Aaberg 11/21/2023 5:20 AM

## 2023-11-21 NOTE — Progress Notes (Signed)
 Nutrition Follow-up  DOCUMENTATION CODES:   Not applicable  INTERVENTION:   Osmolite 1.5 @ 25 ml/hr  Recommend advance TF to goal Osmolite 1.5 @ 50 ml/hr ProSource TF20 60 ml TID  Provides: 2160 kcal, 123 grams protein, and 1003 ml free water  200 ml free water every 8 hours Total free water: 1603 ml   NUTRITION DIAGNOSIS:   Inadequate oral intake related to inability to eat as evidenced by NPO status. Ongoing.   GOAL:   Patient will meet greater than or equal to 90% of their needs Progressing.   MONITOR:   TF tolerance, I & O's  REASON FOR ASSESSMENT:   Consult Enteral/tube feeding initiation and management  ASSESSMENT:   Pt with PMH of HTN, daily mariajuana, and smoker admitted after being found down at home with pontine hemorrhagic stroke and trace SAH. UDS positive for opioids. Noted aspiration PNA on admission.   3/8 - admitted with [facility], intubated 3/9 - Adult TF protocol started 3/14 - s/p cortrak placement, tip gastric  3/22- tracheostomy at bedside 3/24- PEG placement 3/25 diuresed removed 2.2L, Net -1.8L 3/26- emesis, TF paused 3/28 - trickle TF starated 3/31 - TF off due to ileus 4/2 - trickle TF started   Medications reviewed and include: colace, pepcid, 60 mg lasix every 8 hours x 3 doses, SSI novolog every 4 hours, 10 mg Reglan every 8 hours started 3/31 - 4/3, miralax BID, 40 mEq KCl x 3, senna    Labs reviewed: K 3.4 CBG's: 86-101  UOP: 1475 ml  52F NG tube PEG  Diet Order:   Diet Order             Diet NPO time specified  Diet effective now                   EDUCATION NEEDS:   No education needs have been identified at this time  Skin:  Skin Assessment: Skin Integrity Issues: Skin Integrity Issues:: Stage II Stage II: Stage 2, perineum  Last BM:  4/2 bm x 1 unmeasured  Height:   Ht Readings from Last 1 Encounters:  11/20/23 5\' 8"  (1.727 m)    Weight:   Wt Readings from Last 1 Encounters:  11/20/23 101.5 kg     Ideal Body Weight:  70 kg  BMI:  Body mass index is 34.02 kg/m.  Estimated Nutritional Needs:   Kcal:  2000-2200  Protein:  125-140 grams  Fluid:  >2 L/day  Cammy Copa., RD, LDN, CNSC See AMiON for contact information

## 2023-11-21 NOTE — Progress Notes (Signed)
 NAME:  Garrett [name], MRN:  [id number], DOB:  February 22, 1976, LOS: 25 ADMISSION DATE:  10/27/2023, CONSULTATION DATE:  10/27/2023 REFERRING MD:  Coralee Pesa, DO CHIEF COMPLAINT:  Found Down  History of Present Illness:  48 y/o male with past medical history of hypertension who was found down for unknown downtime by his fiance when she came home from work. Reportedly, having agonal breathing.  He apparently had driven her to work this morning.  He has HTN but never checks his BP and does not follow with MD.  She says you can tell when his BP is really high; his eyes get blood shot and he is sweating.  No h/o seizures.  Besides almost daily [name] and cigarette smoking, she is not aware of any other drugs. Drug screen positive for opioids and he was given several doses of Narcan. Patient found to have:  1. 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 Basal cisterns and fourth ventricle are effaced without hydrocephalus at this time. 2. Trace additional subarachnoid hemorrhage along the left parietal convexity. BP in ED 262/156. His CXR revealed a RUL and perihilar infiltrates suggesting aspiration pneumonia. ED labs also revealed PH 7.169, LA 4.4, CPK 985. Patient was intubated in ED.  Pertinent  Medical History  hypertension  Significant Hospital Events: Including procedures, antibiotic start and stop dates in addition to other pertinent events   3/8: Intubated in ED, pontine bleed/SAH. CT head 17:09 Acute large hemorrhage 4.1 cm in pons with intraventricular extension without hydrocephalus 3/9: CTA 03:14 AM No change in IPH in pons, similar biparietal [facility] 3/14: Now awake, appears locked in can follow commands with eyes. 3/16 Purposeful with left upper extremity  3/22 tracheostomy placed at bedside, bleeding issues overnight from trach site 3/24 PEG (Dr. Janee Morn) 3/24  sputum culture with MRSA 3/27 vomited. TF on hold. Abd film c/w ileus. Added reglan, got SSE. Did tolerate PSV all day  3/28 BMs x2 after SSE day prior. Added back TFs at 1/2 rated. Tolerated PS 3/29 Tolerated PS  3/31 Tolerating CPAP PS 15/5, TF on hold due to ileus. 4/1: con't to hold tube feeds today, restarting vancomycin and sending tracheal aspirate as peaks are 37, plat 24, driving 19. Thick secretions. Fever overnight.  4/2: peaks improved. Ongoing hiccups. Trickle feeding. Neuro exam unchanged.  Interim History / Subjective:  Does not follow command. Decerebrate postures on stimulation. Hiccups.   Objective   Blood pressure (!) 143/91, pulse 87, temperature 98.6 F (37 C), temperature source Axillary, resp. rate (!) 23, height 5\' 8"  (1.727 m), weight 101.5 kg, SpO2 100%.    Vent Mode: PRVC FiO2 (%):  [30 %-40 %] 30 % Set Rate:  [15 bmp] 15 bmp Vt Set:  [550 mL] 550 mL PEEP:  [5 cmH20] 5 cmH20 Plateau Pressure:  [14 cmH20-17 cmH20] 17 cmH20   Intake/Output Summary (Last 24 hours) at 11/21/2023 0910 Last data filed at 11/21/2023 0800 Gross per 24 hour  Intake 852.06 ml  Output 1700 ml  Net -847.94 ml   Filed Weights   11/18/23 0500 11/19/23 0500 11/20/23 0500  Weight: 102 kg 102 kg 101.5 kg   Physical Exam: General: chronically ill appearing male, laying in bed, no acute distress  HENT: ncat, anicteric sclera, left conjunctival edema and conjunctival hemorrhage, right with sluggish pupil. Trach in place, cortrak, OGT with dark drainage  Respiratory: vented, trach, lungs diminished bilaterally without rales  or wheezing. Coarse sounds.  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 CT head code stroke with large 4.1cm hemorrhage in pons with IV extension and extension into basal cisterns. Worsening mental status/posturing on 3/29 however CT head stable. - stroke following, appreciate recs  - dvt ppx with lovenox  - no antithrombotic at this time  - 3% saline off and stable, but continued poor neuro exam  - SBP goal < 160, continue amlodipine 10mg  daily, metoprolol 100mg  BID, hydralazine 100mg  q8h, PRN labetalol  - frequent neuro checks  - neuroprotective measures with normothermia, euglycemia, hob >30, replete elytes  - PT/OT - anticipate LTACH need   Dysphagia PEG placed  - con't tube feeds  - protein supplementation   Acute hypoxic respiratory failure due to aspiration pneumonia - s/p tracheostomy placement 3/22. MRSA PNA - sputum culture 3/24.  Received 7 day course of vancomycin. Overnight 3/31 with ongoing fever. 4/1 exam with peaks 37, plat 24, driving 19. Thick secretions. Added vanc. 4/2 with improved pressures.  - tracheal aspirate w/ GPC  - con't vancomycin  - attempt vent weaning as able  - trach care  - con't robitussin BID for thinning secretions  - duoneb prn  - chest PT   Ileus Hiccups 3/31 TF on hold and Cortrack to suction with about 400 cc bloody looking drainage  - reglan started 3/31  - start trickle feeds  - con't pepcid  - minimize sedating meds, opioids, anticholinergics   Hypokalemia  Hyperchloremia  - replete today  - receiving free water 200 q8h - watch sodium  - trend   Macrocytic anemia  Thrombocytosis  No report of chronic alcohol use. B12, folate and TSH normal - trend cbc  - transfuse <7   Poor Dentition upper right teeth and one left lower tooth have chipped off. - dental evaluation eventually   Best Practice (right click and "Reselect all SmartList Selections" daily)   Diet/type: tubefeeds and NPO DVT prophylaxis LMWH Pressure ulcer(s): N/A GI prophylaxis:  H2B Lines: N/A Foley:  NA  Code Status:  DNR with pre-arrest interventions   Critical Care Time: 35 Minutes.   Cristopher Peru, PA-C  Pulmonary & Critical Care 11/21/23 9:10 AM  Please see [email or web address] for pager details.  From 7A-7P if no response, please call [phone] After hours, please call ELink [phone]

## 2023-11-22 DIAGNOSIS — H109 Unspecified conjunctivitis: Secondary | ICD-10-CM

## 2023-11-22 DIAGNOSIS — E870 Hyperosmolality and hypernatremia: Secondary | ICD-10-CM | POA: Diagnosis not present

## 2023-11-22 DIAGNOSIS — H11412 Vascular abnormalities of conjunctiva, left eye: Secondary | ICD-10-CM | POA: Diagnosis not present

## 2023-11-22 DIAGNOSIS — K567 Ileus, unspecified: Secondary | ICD-10-CM | POA: Diagnosis not present

## 2023-11-22 DIAGNOSIS — L8990 Pressure ulcer of unspecified site, unspecified stage: Secondary | ICD-10-CM | POA: Insufficient documentation

## 2023-11-22 DIAGNOSIS — Z930 Tracheostomy status: Secondary | ICD-10-CM

## 2023-11-22 DIAGNOSIS — R1310 Dysphagia, unspecified: Secondary | ICD-10-CM | POA: Diagnosis not present

## 2023-11-22 DIAGNOSIS — J15212 Pneumonia due to Methicillin resistant Staphylococcus aureus: Secondary | ICD-10-CM | POA: Diagnosis not present

## 2023-11-22 DIAGNOSIS — Z7189 Other specified counseling: Secondary | ICD-10-CM

## 2023-11-22 DIAGNOSIS — I10 Essential (primary) hypertension: Secondary | ICD-10-CM | POA: Diagnosis not present

## 2023-11-22 DIAGNOSIS — I613 Nontraumatic intracerebral hemorrhage in brain stem: Secondary | ICD-10-CM | POA: Diagnosis not present

## 2023-11-22 DIAGNOSIS — E876 Hypokalemia: Secondary | ICD-10-CM | POA: Diagnosis not present

## 2023-11-22 DIAGNOSIS — Z9911 Dependence on respirator [ventilator] status: Secondary | ICD-10-CM

## 2023-11-22 DIAGNOSIS — J9601 Acute respiratory failure with hypoxia: Secondary | ICD-10-CM | POA: Diagnosis not present

## 2023-11-22 LAB — CULTURE, RESPIRATORY W GRAM STAIN

## 2023-11-22 LAB — GLUCOSE, CAPILLARY
Glucose-Capillary: 105 mg/dL — ABNORMAL HIGH (ref 70–99)
Glucose-Capillary: 110 mg/dL — ABNORMAL HIGH (ref 70–99)
Glucose-Capillary: 110 mg/dL — ABNORMAL HIGH (ref 70–99)
Glucose-Capillary: 123 mg/dL — ABNORMAL HIGH (ref 70–99)
Glucose-Capillary: 126 mg/dL — ABNORMAL HIGH (ref 70–99)
Glucose-Capillary: 95 mg/dL (ref 70–99)

## 2023-11-22 MED ORDER — POTASSIUM CHLORIDE 20 MEQ PO PACK
40.0000 meq | PACK | ORAL | Status: AC
  Administered 2023-11-22 – 2023-11-23 (×3): 40 meq
  Filled 2023-11-22 (×3): qty 2

## 2023-11-22 MED ORDER — ERYTHROMYCIN 5 MG/GM OP OINT
TOPICAL_OINTMENT | Freq: Three times a day (TID) | OPHTHALMIC | Status: DC
  Administered 2023-11-22 – 2023-11-25 (×11): 1 via OPHTHALMIC
  Filled 2023-11-22: qty 1

## 2023-11-22 MED ORDER — METOCLOPRAMIDE HCL 5 MG/ML IJ SOLN
10.0000 mg | Freq: Three times a day (TID) | INTRAMUSCULAR | Status: AC
  Administered 2023-11-22 – 2023-11-25 (×9): 10 mg via INTRAVENOUS
  Filled 2023-11-22 (×9): qty 2

## 2023-11-22 MED ORDER — FUROSEMIDE 10 MG/ML IJ SOLN
60.0000 mg | Freq: Once | INTRAMUSCULAR | Status: AC
Start: 2023-11-22 — End: 2023-11-22
  Administered 2023-11-22: 60 mg via INTRAVENOUS
  Filled 2023-11-22: qty 6

## 2023-11-22 MED ORDER — PROCHLORPERAZINE EDISYLATE 10 MG/2ML IJ SOLN
10.0000 mg | INTRAMUSCULAR | Status: DC | PRN
  Administered 2023-11-27: 10 mg via INTRAVENOUS
  Filled 2023-11-22: qty 2

## 2023-11-22 MED ORDER — ADULT MULTIVITAMIN W/MINERALS CH
1.0000 | ORAL_TABLET | Freq: Every day | ORAL | Status: DC
  Administered 2023-11-22 – 2024-06-21 (×211): 1
  Filled 2023-11-22 (×210): qty 1

## 2023-11-22 NOTE — Progress Notes (Signed)
 Occupational Therapy Treatment Patient Details Name: Garrett [name] MRN: [id number] DOB: September 19, 1975 Today's Date: 11/22/2023   History of present illness Pt is a 48 yo male who was found down 10/27/23 with agonal breathing. 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 Intubated 3/8, cortrak placed 3/14, trach placed 3/22, PEG placed 3/24. PMH: HTN, smoker   OT comments  Pt supine in bed on vent via trach. Donned LUE hand splint. Repositioning RUE d/t noted poor positioning with continued extensor posturing. Utilized pillows to provide additional support and positioning to decrease BUE edema. Will return in 2 hours to check tolerance and fit.       If plan is discharge home, recommend the following:   (total care)   Equipment Recommendations  Wheelchair cushion (measurements OT);Hospital bed;Wheelchair (measurements OT);Hoyer lift       Precautions / Restrictions Precautions Precautions: Fall;Other (comment) Recall of Precautions/Restrictions: Impaired Precaution/Restrictions Comments: trach on vent, PEG, abdominal binder, SBP < 160 Restrictions Weight Bearing Restrictions Per Provider Order: No              ADL either performed or assessed with clinical judgement    Extremity/Trunk Assessment Upper Extremity Assessment RUE Deficits / Details: Resting hand splint on upon therapy arrival. Noted 5th digit flexed off edge of splint. Repositioned splint on hand and tightned strapping along 5th digit to decrease movement when pt demonstrates extensor tone. mild edema noted. Utilized pillows to provide additional elevation and positioning needs to decrease. LUE Deficits / Details: Moderate swelling noted. Applied resting hand splint. Utilized additional pillows to provide needed support and positioning to help decrease  edema Will return to check in ~2 hours.                     Communication Communication Communication: Impaired Factors Affecting Communication: Trach/intubated   Cognition Arousal: Obtunded Behavior During Therapy: Flat affect Cognition: Difficult to assess Difficult to assess due to: Intubated, Tracheostomy           OT - Cognition Comments: intubated via trach, Pt sleeping at first session and did not wake up with verbal and tactile stimuli. Second session did have right eye open although no visual tracking present or acknowledgement of OT.      Following commands: Impaired Following commands impaired:  (no commands followed during sessions)      Cueing   Cueing Techniques:  (no cueing techniques effective during esssion.)             Pertinent Vitals/ Pain       Pain Assessment Pain Assessment: CPOT Facial Expression: Relaxed, neutral Body Movements: Absence of movements Muscle Tension: Relaxed Compliance with ventilator (intubated pts.): Tolerating ventilator or movement Vocalization (extubated pts.): N/A CPOT Total: 0         Frequency  Min 2X/week        Progress Toward Goals  OT Goals(current goals can now be found in the care plan section)  Progress towards OT goals: Progressing toward goals     Plan      Co-evaluation                 AM-PAC OT "6 Clicks" Daily Activity     Outcome Measure   Help from another person eating meals?: Total Help from another person taking care of personal grooming?: Total Help from another person toileting, which  includes using toliet, bedpan, or urinal?: Total Help from another person bathing (including washing, rinsing, drying)?: Total Help from another person to put on and taking off regular upper body clothing?: Total   6 Click Score: 5    End of Session Equipment Utilized During Treatment: Oxygen (via trach/vent)  OT Visit Diagnosis: Other abnormalities of gait and mobility  (R26.89);Muscle weakness (generalized) (M62.81);Other symptoms and signs involving the nervous system (R29.898);Other symptoms and signs involving cognitive function;Hemiplegia and hemiparesis Hemiplegia - caused by: Nontraumatic SAH   Activity Tolerance Patient limited by lethargy   Patient Left in bed;with call bell/phone within reach;with bed alarm set;with family/visitor present   Nurse Communication Other (comment) (splints)        Time: 1445-1500 OT Time Calculation (min): 15 min  Charges: OT General Charges $OT Visit: 1 Visit OT Treatments $Orthotics Fit/Training: 8-22 mins  Limmie Patricia, OTR/L,CBIS  Supplemental OT - [facility] and [facility] Secure Chat Preferred    Mykiah Schmuck, Charisse March 11/22/2023, 9:40 PM

## 2023-11-22 NOTE — Plan of Care (Signed)
 No significant overnight issues. Extensor movement noted when patient coughs. No emesis episodes overnight. NG tube remains in place and clamped. Spouse at the bedside and updated on plan of care. ICU status maintained.   Problem: Nutrition: Goal: Dietary intake will improve Outcome: Progressing   Problem: Education: Goal: Knowledge of General Education information will improve Description: Including pain rating scale, medication(s)/side effects and non-pharmacologic comfort measures Outcome: Not Progressing

## 2023-11-22 NOTE — TOC Progression Note (Signed)
 Transition of Care [facility]) - Progression Note    Patient Details  Name: Garrett [name] MRN: [id number] Date of Birth: May 03, 1976  Transition of Care [facility]) CM/SW Contact  Mearl Latin, LCSW Phone Number: 11/22/2023, 3:11 PM  Clinical Narrative:    CSW spoke with patient's mother to provide Vent SNF update: 3 [place] [facility] SNFs: -[facility]: Does not accept pending Medicaid -[facility]: Does not accept pending Medicaid -[facility]: Referral sent for review as Medicaid pending.   Per Dennison Bulla with [facility], she contacted Medicaid office today and there is not a caseworker assigned yet - MID # [id number] Q.    CSW discussed case with Tammy with [facility] in [place] and she will be prepared to move forward with family on [place] process. [name] requests to wait to hear back from [facility] prior to proceeding with [place] placement.   Expected Discharge Plan: Long Term Nursing Home Barriers to Discharge: Continued Medical Work up, SNF Pending Medicaid, SNF Pending bed offer, SNF Pending payor source - LOG, Inadequate or no insurance (New trach)  Expected Discharge Plan and Services In-house Referral: Clinical Social Work, Hospice / Palliative Care   Post Acute Care Choice: Skilled Nursing Facility Living arrangements for the past 2 months: Single Family Home                                       Social Determinants of Health (SDOH) Interventions SDOH Screenings   Food Insecurity: Patient Unable To Answer (10/29/2023)  Social Connections: Unknown (06/23/2023)   Received from [facility]  Tobacco Use: High Risk (10/31/2023)    Readmission Risk Interventions     No data to display

## 2023-11-22 NOTE — Progress Notes (Signed)
 Occupational Therapy Treatment Patient Details Name: Garrett [name] MRN: [id number] DOB: 1976/05/22 Today's Date: 11/22/2023   History of present illness Pt is a 48 yo male who was found down 10/27/23 with agonal breathing. 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 Intubated 3/8, cortrak placed 3/14, trach placed 3/22, PEG placed 3/24. PMH: HTN, smoker   OT comments  Patient supine in bed, OT returned for splint check.  Good tolerance to Both UE splints this date. Updated orders in chart. Education provided to fiance regarding BUE positioning techniques to decrease edema and increase ROM and circulation. OK for nursing to utilize bilateral resting hands splints at this time. OT will continue to monitor pt's tolerance level and update recommendation as needed.       If plan is discharge home, recommend the following:   (total care)   Equipment Recommendations  Wheelchair cushion (measurements OT);Hospital bed;Wheelchair (measurements OT);Hoyer lift       Precautions / Restrictions Precautions Precautions: Fall;Other (comment) Recall of Precautions/Restrictions: Impaired Precaution/Restrictions Comments: trach on vent, PEG, abdominal binder, SBP < 160 Restrictions Weight Bearing Restrictions Per Provider Order: No              ADL either performed or assessed with clinical judgement              Communication Communication Communication: Impaired Factors Affecting Communication: Trach/intubated   Cognition Arousal: Lethargic Behavior During Therapy: Flat affect Cognition: Difficult to assess Difficult to assess due to: Intubated, Tracheostomy           OT - Cognition Comments: Second OT session right eye open althoughno visual tracking present or acknowledgement of OT.    Following commands:  Impaired Following commands impaired:  (No commands followed this date)      Cueing   Cueing Techniques:  (no cueing techniques effective during esssion.)        General Comments Returned for splint check for BUE resting hand splints. Pt tolerating splints well bilaterally. No shift in hand positions noted. Education provided to fiance on findings during splint check with education to straighten and bend pt's elbow's periodically to focus on ROM, circulation, decrease edema and increase tone.    Pertinent Vitals/ Pain       Pain Assessment Pain Assessment: CPOT Facial Expression: Relaxed, neutral Body Movements: Absence of movements Muscle Tension: Relaxed Compliance with ventilator (intubated pts.): Tolerating ventilator or movement Vocalization (extubated pts.): N/A CPOT Total: 0         Frequency  Min 2X/week        Progress Toward Goals  OT Goals(current goals can now be found in the care plan section)  Progress towards OT goals: Progressing toward goals            AM-PAC OT "6 Clicks" Daily Activity     Outcome Measure   Help from another person eating meals?: Total Help from another person taking care of personal grooming?: Total Help from another person toileting, which includes using toliet, bedpan, or urinal?: Total Help from another person bathing (including washing, rinsing, drying)?: Total Help from another person to put on and taking off regular upper body clothing?: Total Help from another person to put on and taking off regular lower body clothing?: Total 6 Click Score: 6    End of Session Equipment Utilized During Treatment: Oxygen;Other (comment) (trach/vent)  OT Visit Diagnosis:  Other abnormalities of gait and mobility (R26.89);Muscle weakness (generalized) (M62.81);Other symptoms and signs involving the nervous system (R29.898);Other symptoms and signs involving cognitive function;Hemiplegia and hemiparesis Hemiplegia - caused by: Nontraumatic  SAH   Activity Tolerance Patient limited by lethargy   Patient Left in bed;with call bell/phone within reach;with bed alarm set;with family/visitor present   Nurse Communication Other (comment) (splints)        Time: 9147-8295 OT Time Calculation (min): 18 min  Charges: OT General Charges $OT Visit: 1 Visit OT Treatments $Orthotics Fit/Training: 8-22 mins $Orthotics/Prosthetics Check: 8-22 mins  Limmie Patricia, OTR/L,CBIS  Supplemental OT - [facility] and [facility] Secure Chat Preferred    Soo Steelman, Charisse March 11/22/2023, 9:55 PM

## 2023-11-22 NOTE — Progress Notes (Signed)
 Nutrition Follow-up  DOCUMENTATION CODES:   Not applicable  INTERVENTION:   Osmolite 1.5 @ 25 ml/hr; increase by 10 ml every 8 hours to goal rate of 50 ml/hr  Osmolite 1.5 @ 50 ml/hr [facility] TF20 60 ml TID  Provides: 2040 kcal, 135 grams protein, and 912 ml free water  200 ml free water every 8 hours Total free water: 1512 ml   Add MVI with minerals daily via tube   NUTRITION DIAGNOSIS:   Inadequate oral intake related to inability to eat as evidenced by NPO status. Ongoing.   GOAL:   Patient will meet greater than or equal to 90% of their needs Progressing.   MONITOR:   TF tolerance, I & O's  REASON FOR ASSESSMENT:   Consult Enteral/tube feeding initiation and management  ASSESSMENT:   Pt with PMH of HTN, daily mariajuana, and smoker admitted after being found down at home with pontine hemorrhagic stroke and trace SAH. UDS positive for opioids. Noted aspiration PNA on admission.  Pt discussed during ICU rounds and with RN and MD.  Per MD pt will likely need chronic trach and likely SNF level of care.    3/8 - admitted with [facility], intubated 3/9 - Adult TF protocol started 3/14 - s/p cortrak placement, tip gastric  3/22- tracheostomy at bedside 3/24- PEG placement 3/25 diuresed removed 2.2L, Net -1.8L 3/26- emesis, TF paused 3/28 - trickle TF starated 3/31 - TF off due to ileus 4/2 - trickle TF started   Medications reviewed and include: colace, pepcid, SSI novolog every 4 hours, 10 mg Reglan every 8 hours started 3/31 - 4/3, miralax BID, senna    Labs reviewed: K 3.4 CBG's: 86-126  UOP: 3175 ml  30F NG tube PEG  Current weight: 98 kg Admission weight: 103 kg   Diet Order:   Diet Order             Diet NPO time specified  Diet effective now                   EDUCATION NEEDS:   No education needs have been identified at this time  Skin:  Skin Assessment: Skin Integrity Issues: Skin Integrity Issues:: Stage II Stage II: Stage 2,  perineum  Last BM:  400 ml and 1 unmeasured x 24 hrs  Height:   Ht Readings from Last 1 Encounters:  11/21/23 5\' 8"  (1.727 m)    Weight:   Wt Readings from Last 1 Encounters:  11/22/23 98 kg    Ideal Body Weight:  70 kg  BMI:  Body mass index is 32.85 kg/m.  Estimated Nutritional Needs:   Kcal:  2000-2200  Protein:  125-140 grams  Fluid:  >2 L/day  Cammy Copa., RD, LDN, CNSC See AMiON for contact information

## 2023-11-22 NOTE — Progress Notes (Signed)
 NAME:  Garrett [name], MRN:  [id number], DOB:  [birth year], LOS: 26 ADMISSION DATE:  10/27/2023, CONSULTATION DATE:  10/27/2023 REFERRING MD:  Coralee Pesa, DO CHIEF COMPLAINT:  Found Down  History of Present Illness:  48 y/o male with past medical history of hypertension who was found down for unknown downtime by his fiance when she came home from work. Reportedly, having agonal breathing.  He apparently had driven her to work this morning.  He has HTN but never checks his BP and does not follow with MD.  She says you can tell when his BP is really high; his eyes get blood shot and he is sweating.  No h/o seizures.  Besides almost daily [name] and cigarette smoking, she is not aware of any other drugs. Drug screen positive for opioids and he was given several doses of Narcan. Patient found to have:  1. 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 Basal cisterns and fourth ventricle are effaced without hydrocephalus at this time. 2. Trace additional subarachnoid hemorrhage along the left parietal convexity. BP in ED 262/156. His CXR revealed a RUL and perihilar infiltrates suggesting aspiration pneumonia. ED labs also revealed PH 7.169, LA 4.4, CPK 985. Patient was intubated in ED.  Pertinent  Medical History  hypertension  Significant Hospital Events: Including procedures, antibiotic start and stop dates in addition to other pertinent events   3/8: Intubated in ED, pontine bleed/SAH. CT head 17:09 Acute large hemorrhage 4.1 cm in pons with intraventricular extension without hydrocephalus 3/9: CTA 03:14 AM No change in IPH in pons, similar biparietal [facility] 3/14: Now awake, appears locked in can follow commands with eyes. 3/16 Purposeful with left upper extremity  3/22 tracheostomy placed at bedside, bleeding issues overnight from trach site 3/24 PEG (Dr. Janee Morn) 3/24  sputum culture with MRSA 3/27 vomited. TF on hold. Abd film c/w ileus. Added reglan, got SSE. Did tolerate PSV all day  3/28 BMs x2 after SSE day prior. Added back TFs at 1/2 rated. Tolerated PS 3/29 Tolerated PS  3/31 Tolerating CPAP PS 15/5, TF on hold due to ileus. 4/1: con't to hold tube feeds today, restarting vancomycin and sending tracheal aspirate as peaks are 37, plat 24, driving 19. Thick secretions. Fever overnight.  4/2: peaks improved. Ongoing hiccups. Trickle feeding. Neuro exam unchanged.  Interim History / Subjective:  Following commands on LUE and tracks eyes. Pending insurance approval for LTACH/SNF placement. Increasing tube feeds to goal today and then can dc NGT if he tolerates without vomiting.   Objective   Blood pressure 133/82, pulse 91, temperature 99.5 F (37.5 C), temperature source Axillary, resp. rate 17, height 5\' 8"  (1.727 m), weight 98 kg, SpO2 100%.    Vent Mode: PSV;CPAP FiO2 (%):  [30 %] 30 % Set Rate:  [15 bmp] 15 bmp Vt Set:  [550 mL] 550 mL PEEP:  [5 cmH20] 5 cmH20 Pressure Support:  [12 cmH20] 12 cmH20 Plateau Pressure:  [18 cmH20-19 cmH20] 18 cmH20   Intake/Output Summary (Last 24 hours) at 11/22/2023 0945 Last data filed at 11/22/2023 0700 Gross per 24 hour  Intake 752.57 ml  Output 3350 ml  Net -2597.43 ml   Filed Weights   11/20/23 0500 11/21/23 0500 11/22/23 0500  Weight: 101.5 kg 98 kg 98 kg   Physical Exam: General: chronically ill appearing male, laying in bed, no acute distress  HENT: ncat, anicteric sclera, left pupil reactive,  right with conjunctival hemorrhage sluggish pupil. Trach in place, cortrak, NGT with dark drainage clamped Respiratory: vented, trach, lungs diminished bilaterally without rales or wheezing. Coarse sounds.  Cardiovascular: s1s2, rrr, no m/r/g GI: abdomen rounded, +BS, cortrak, OGT clamped, TF Extremities: anasarca  Neuro: wakes spontaneous, attempts to track with eyes. Follows commands when holding his left  hand against gravity with assistance. Posturing in RUE/BLE. 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 CT head code stroke with large 4.1cm hemorrhage in pons with IV extension and extension into basal cisterns. Worsening mental status/posturing on 3/29 however CT head stable. - stroke following, appreciate recs  - dvt ppx with lovenox  - no antithrombotic at this time  - 3% saline off and stable, but continued poor neuro exam  - SBP goal < 160, continue amlodipine 10mg  daily, metoprolol 100mg  BID, hydralazine 100mg  q8h, PRN labetalol  - frequent neuro checks  - neuroprotective measures with normothermia, euglycemia, hob >30, replete elytes  - PT/OT - anticipate LTACH need   Dysphagia PEG placed  - increasing TF to goal - remove NGT after he does not vomit with TF at goal  - protein supplementation   Acute hypoxic respiratory failure due to aspiration pneumonia - s/p tracheostomy placement 3/22. MRSA PNA - sputum culture 3/24.  Received 7 day course of vancomycin. Overnight 3/31 with ongoing fever. 4/1 exam with peaks 37, plat 24, driving 19. Thick secretions. Added vanc. 4/2 with improved pressures.  - tracheal aspirate w/ GPC  - con't vancomycin for total of 7 more days, end 4/9  - attempt vent weaning as able  - trach care  - con't robitussin BID for thinning secretions  - duoneb prn  - chest PT   Ileus Hiccups 3/31 TF on hold and Cortrack to suction with about 400 cc bloody looking drainage  - reglan started 3/31  - increase tube feeds to goal today  - if tolerates TF without vomiting, can remove NGT  - con't pepcid  - minimize sedating meds, opioids, anticholinergics   Hyperchloremia  - receiving free water 200 q8h   Macrocytic anemia  Thrombocytosis  No report of chronic alcohol use. B12, folate and TSH normal - trend cbc  -  transfuse <7   Poor Dentition upper right teeth and one left lower tooth have chipped off. - dental evaluation eventually   Best Practice (right click and "Reselect all SmartList Selections" daily)   Diet/type: tubefeeds and NPO DVT prophylaxis LMWH Pressure ulcer(s): N/A GI prophylaxis: H2B Lines: N/A Foley:  NA  Code Status:  DNR with pre-arrest interventions   Critical Care Time: 35 Minutes.   Cristopher Peru, PA-C [facility] 11/22/23 9:45 AM  Please see [email or web address] for pager details.  From 7A-7P if no response, please call [phone] After hours, please call ELink [phone]

## 2023-11-22 NOTE — Progress Notes (Addendum)
 Physical Therapy Treatment Patient Details Name: Garrett [name] MRN: [id number] DOB: 24-Apr-1976 Today's Date: 11/22/2023   History of Present Illness Pt is a 48 yo male who was found down 10/27/23 with agonal breathing. 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 Intubated 3/8, cortrak placed 3/14, trach placed 3/22, PEG placed 3/24. PMH: HTN, smoker    PT Comments  Pt with improved alertness today and follows 2 commands during session. Transitioned to edge of bed dependently to promote upright, challenge sitting balance, and activate core/trunk musculature. Pt able to wiggle toes on L, but otherwise no active movement noted. Unable to lift head against gravity. Will continue to progress as tolerated.    If plan is discharge home, recommend the following: Two people to help with walking and/or transfers;Two people to help with bathing/dressing/bathroom;Assistance with cooking/housework;Assistance with feeding;Direct supervision/assist for medications management;Direct supervision/assist for financial management;Assist for transportation;Help with stairs or ramp for entrance;Supervision due to cognitive status   Can travel by private vehicle        Equipment Recommendations  [place] lift;Hospital bed;Wheelchair (measurements PT);Wheelchair cushion (measurements PT);BSC/3in1    Recommendations for Other Services       Precautions / Restrictions Precautions Precautions: Fall;Other (comment) Recall of Precautions/Restrictions: Impaired Precaution/Restrictions Comments: trach on vent, PEG, abdominal binder, SBP < 160 Restrictions Weight Bearing Restrictions Per Provider Order: No     Mobility  Bed Mobility Overal bed mobility: Needs Assistance Bed Mobility: Supine to Sit, Sit to Supine     Supine to sit: Total assist, +2  for physical assistance Sit to supine: Total assist, +2 for physical assistance   General bed mobility comments: TotalA + 2 for supine <> sit    Transfers                        Ambulation/Gait                   Stairs             Wheelchair Mobility     Tilt Bed    Modified Rankin (Stroke Patients Only) Modified Rankin (Stroke Patients Only) Pre-Morbid Rankin Score: No symptoms Modified Rankin: Severe disability     Balance Overall balance assessment: Needs assistance Sitting-balance support: Feet supported Sitting balance-Leahy Scale: Poor Sitting balance - Comments: MaxA for static sitting balance, sits with neck flexed                                    Communication Communication Communication: Impaired Factors Affecting Communication: Trach/intubated  Cognition Arousal: Alert Behavior During Therapy: Flat affect   PT - Cognitive impairments: Difficult to assess Difficult to assess due to: Intubated                     PT - Cognition Comments: Pt with R eye open throughout (L eye with bandage). Pt follows ~2 commands Following commands: Impaired Following commands impaired: Follows one step commands inconsistently    Cueing Cueing Techniques: Verbal cues, Tactile cues  Exercises General Exercises - Lower Extremity Ankle Circles/Pumps: PROM, Both, 10 reps, Seated Long Arc Quad: PROM, Both, 10 reps, Seated Other Exercises Other Exercises: Sitting: lap slides x 10    General Comments  Pertinent Vitals/Pain Pain Assessment Pain Assessment: CPOT Facial Expression: Relaxed, neutral Body Movements: Absence of movements Muscle Tension: Relaxed Compliance with ventilator (intubated pts.): Tolerating ventilator or movement Vocalization (extubated pts.): N/A CPOT Total: 0    Home Living                          Prior Function            PT Goals (current goals can now be found in the  care plan section) Acute Rehab PT Goals Patient Stated Goal: unable to state Potential to Achieve Goals: Fair    Frequency    Min 2X/week      PT Plan      Co-evaluation              AM-PAC PT "6 Clicks" Mobility   Outcome Measure  Help needed turning from your back to your side while in a flat bed without using bedrails?: Total Help needed moving from lying on your back to sitting on the side of a flat bed without using bedrails?: Total Help needed moving to and from a bed to a chair (including a wheelchair)?: Total Help needed standing up from a chair using your arms (e.g., wheelchair or bedside chair)?: Total Help needed to walk in hospital room?: Total Help needed climbing 3-5 steps with a railing? : Total 6 Click Score: 6    End of Session Equipment Utilized During Treatment: Oxygen Activity Tolerance: Patient tolerated treatment well Patient left: in bed;with call bell/phone within reach Nurse Communication: Mobility status PT Visit Diagnosis: Muscle weakness (generalized) (M62.81);Difficulty in walking, not elsewhere classified (R26.2);Other symptoms and signs involving the nervous system (R29.898);Hemiplegia and hemiparesis Hemiplegia - Right/Left: Right Hemiplegia - dominant/non-dominant: Dominant Hemiplegia - caused by: Nontraumatic intracerebral hemorrhage     Time: 1009-1037 PT Time Calculation (min) (ACUTE ONLY): 28 min  Charges:    $Therapeutic Activity: 23-37 mins PT General Charges $$ ACUTE PT VISIT: 1 Visit                     Garrett [name], PT, DPT Acute Rehabilitation Services Office [phone]    Garrett [name] 11/22/2023, 11:42 AM

## 2023-11-22 NOTE — IPAL (Signed)
  Interdisciplinary Goals of Care Family Meeting   Date carried out:: 11/22/2023  Location of the meeting: Bedside  Member's involved: Physician, Bedside Registered Nurse, and Family Member or next of kin  Durable Power of Attorney or acting medical decision maker: parents    Discussion: We discussed goals of care for Garrett [name] .  I met with Garrett [name]'s fiancee at bedside and his parents on speakerphone in the room with Rns. We discussed his ongoing care, ongoing need for MV (has been on the vent every day of admission so far), and likely chronic need for tracheostomy. He has some movement in his left hand, but his overall functional status has not improved with this. I relayed that I suspect he will still be totally dependent on others for his care, and for most people this requires SNF due to the intensity of providing this care. His mother had questions about his medicaid application, and I have asked CM to call her to answer questions. Due to need for tracheostomy, he would potentially require a SNF not close to his him, potentially even out of state. His mother related that she believes he will improve and has difficulty trusting what we are saying since he survived when people said he would not. She said a doctor told her this admission he will walk again, and she wishes to continue all aggressive care. All questions were answered.   Code status: no change- DNR- no chest compressions, but other interventions desired  Disposition: Continue current acute care   Time spent for the meeting: 20 min.  Steffanie Dunn 11/22/2023, 11:20 AM

## 2023-11-23 DIAGNOSIS — K567 Ileus, unspecified: Secondary | ICD-10-CM | POA: Diagnosis not present

## 2023-11-23 DIAGNOSIS — I613 Nontraumatic intracerebral hemorrhage in brain stem: Secondary | ICD-10-CM | POA: Diagnosis not present

## 2023-11-23 DIAGNOSIS — E878 Other disorders of electrolyte and fluid balance, not elsewhere classified: Secondary | ICD-10-CM

## 2023-11-23 DIAGNOSIS — D75839 Thrombocytosis, unspecified: Secondary | ICD-10-CM | POA: Diagnosis not present

## 2023-11-23 DIAGNOSIS — J9601 Acute respiratory failure with hypoxia: Secondary | ICD-10-CM | POA: Diagnosis not present

## 2023-11-23 DIAGNOSIS — R1310 Dysphagia, unspecified: Secondary | ICD-10-CM | POA: Diagnosis not present

## 2023-11-23 DIAGNOSIS — J690 Pneumonitis due to inhalation of food and vomit: Secondary | ICD-10-CM | POA: Diagnosis not present

## 2023-11-23 DIAGNOSIS — B9562 Methicillin resistant Staphylococcus aureus infection as the cause of diseases classified elsewhere: Secondary | ICD-10-CM | POA: Diagnosis not present

## 2023-11-23 DIAGNOSIS — J15212 Pneumonia due to Methicillin resistant Staphylococcus aureus: Secondary | ICD-10-CM | POA: Diagnosis not present

## 2023-11-23 DIAGNOSIS — H11412 Vascular abnormalities of conjunctiva, left eye: Secondary | ICD-10-CM | POA: Diagnosis not present

## 2023-11-23 DIAGNOSIS — J208 Acute bronchitis due to other specified organisms: Secondary | ICD-10-CM | POA: Diagnosis not present

## 2023-11-23 DIAGNOSIS — I619 Nontraumatic intracerebral hemorrhage, unspecified: Secondary | ICD-10-CM | POA: Diagnosis not present

## 2023-11-23 LAB — GLUCOSE, CAPILLARY
Glucose-Capillary: 117 mg/dL — ABNORMAL HIGH (ref 70–99)
Glucose-Capillary: 145 mg/dL — ABNORMAL HIGH (ref 70–99)
Glucose-Capillary: 138 mg/dL — ABNORMAL HIGH (ref 70–99)
Glucose-Capillary: 106 mg/dL — ABNORMAL HIGH (ref 70–99)
Glucose-Capillary: 120 mg/dL — ABNORMAL HIGH (ref 70–99)
Glucose-Capillary: 121 mg/dL — ABNORMAL HIGH (ref 70–99)

## 2023-11-23 LAB — VANCOMYCIN, TROUGH: Vancomycin Tr: 9 ug/mL — ABNORMAL LOW (ref 15–20)

## 2023-11-23 MED ORDER — VANCOMYCIN HCL 1250 MG/250ML IV SOLN
1250.0000 mg | INTRAVENOUS | Status: DC
  Administered 2023-11-23 – 2023-11-27 (×5): 1250 mg via INTRAVENOUS
  Filled 2023-11-23 (×5): qty 250

## 2023-11-23 MED ORDER — HYDRALAZINE HCL 50 MG PO TABS
50.0000 mg | ORAL_TABLET | Freq: Three times a day (TID) | ORAL | Status: DC
  Administered 2023-11-23 – 2023-11-28 (×15): 50 mg
  Filled 2023-11-23 (×15): qty 1

## 2023-11-23 NOTE — Progress Notes (Deleted)
 NAME:  Garrett [name], MRN:  [id number], DOB:  1976-06-04, LOS: 27 ADMISSION DATE:  10/27/2023, CONSULTATION DATE:  10/27/2023 REFERRING MD:  Coralee Pesa, DO CHIEF COMPLAINT:  Found Down  History of Present Illness:  48 y/o male with past medical history of hypertension who was found down for unknown downtime by his fiance when she came home from work. Reportedly, having agonal breathing.  He apparently had driven her to work this morning.  He has HTN but never checks his BP and does not follow with MD.  She says you can tell when his BP is really high; his eyes get blood shot and he is sweating.  No h/o seizures.  Besides almost daily [name] and cigarette smoking, she is not aware of any other drugs. Drug screen positive for opioids and he was given several doses of Narcan. Patient found to have:  1. 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 Basal cisterns and fourth ventricle are effaced without hydrocephalus at this time. 2. Trace additional subarachnoid hemorrhage along the left parietal convexity. BP in ED 262/156. His CXR revealed a RUL and perihilar infiltrates suggesting aspiration pneumonia. ED labs also revealed PH 7.169, LA 4.4, CPK 985. Patient was intubated in ED.  Pertinent  Medical History  hypertension  Significant Hospital Events: Including procedures, antibiotic start and stop dates in addition to other pertinent events   3/8: Intubated in ED, pontine bleed/SAH. CT head 17:09 Acute large hemorrhage 4.1 cm in pons with intraventricular extension without hydrocephalus 3/9: CTA 03:14 AM No change in IPH in pons, similar biparietal [facility] 3/14: Now awake, appears locked in can follow commands with eyes. 3/16 Purposeful with left upper extremity  3/22 tracheostomy placed at bedside, bleeding issues overnight from trach site 3/24 PEG (Dr. Janee Morn) 3/24  sputum culture with MRSA 3/27 vomited. TF on hold. Abd film c/w ileus. Added reglan, got SSE. Did tolerate PSV all day  3/28 BMs x2 after SSE day prior. Added back TFs at 1/2 rated. Tolerated PS 3/29 Tolerated PS  3/31 Tolerating CPAP PS 15/5, TF on hold due to ileus. 4/1: con't to hold tube feeds today, restarting vancomycin and sending tracheal aspirate as peaks are 37, plat 24, driving 19. Thick secretions. Fever overnight.  4/2: peaks improved. Ongoing hiccups. Trickle feeding. Neuro exam unchanged.  Interim History / Subjective:  On spontaneous mode Hiccups resolved.  Per nurse can intermittently follow commands with LUE  Objective   Blood pressure 126/81, pulse 94, temperature 98.1 F (36.7 C), temperature source Axillary, resp. rate (!) 22, height 5\' 8"  (1.727 m), weight 97.9 kg, SpO2 100%.    Vent Mode: PSV;CPAP FiO2 (%):  [30 %] 30 % Set Rate:  [15 bmp] 15 bmp Vt Set:  [550 mL] 550 mL PEEP:  [5 cmH20] 5 cmH20 Pressure Support:  [5 cmH20-12 cmH20] 5 cmH20   Intake/Output Summary (Last 24 hours) at 11/23/2023 0900 Last data filed at 11/23/2023 0608 Gross per 24 hour  Intake 2227.06 ml  Output 1775 ml  Net 452.06 ml   Filed Weights   11/21/23 0500 11/22/23 0500 11/23/23 0500  Weight: 98 kg 98 kg 97.9 kg   Physical Exam: General: chronically ill appearing male, laying in bed HENT: Trach to vent, left eye erythematous, edematous, covered with gauze Respiratory: diminished, breath sounds of mechanical ventilation Cardiovascular: tachycardic, regular GI: soft, nontender, nondistended, PEG in place Extremities: diffuse anasarca  Neuro:  not awake or alert, extensor posturing  Resolved problem list   AKI Hypernatremia  Assessment & Plan:  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 CT head code stroke with large 4.1cm hemorrhage in pons with IV extension and extension into basal cisterns. Worsening mental  status/posturing on 3/29 however CT head stable. - stroke following, appreciate recs  - dvt ppx with lovenox  - no antithrombotic at this time  - 3% saline off and stable, but continued poor neuro exam  - SBP goal < 160, continue amlodipine 10mg  daily, metoprolol 100mg  BID, hydralazine 100mg  q8h, PRN labetalol  - frequent neuro checks  - neuroprotective measures with normothermia, euglycemia, hob >30, replete elytes  - PT/OT - anticipate LTACH need   Dysphagia Ileus PEG placed  - reglan started 3/31, continue pepcid - tube feeds at goal - protein supplementation   Acute hypoxic respiratory failure due to aspiration pneumonia - s/p tracheostomy placement 3/22. MRSA PNA - sputum culture 3/24.  Received 7 day course of vancomycin. Overnight 3/31 with ongoing fever. 4/1 exam with peaks 37, plat 24, driving 19. Thick secretions. Added vanc. 4/2 with improved pressures.  - tracheal aspirate w/ GPC  - con't vancomycin for total of 7 more days, end 4/9  - attempt vent weaning as able  - trach care  - con't robitussin BID for thinning secretions  - duoneb prn  - chest PT   Hyperchloremia  - receiving free water 200 q8h   Macrocytic anemia  Thrombocytosis  No report of chronic alcohol use. B12, folate and TSH normal - trend cbc  - transfuse <7   Poor Dentition upper right teeth and one left lower tooth have chipped off. - dental evaluation eventually   Best Practice (right click and "Reselect all SmartList Selections" daily)   Diet/type: tubefeeds and NPO DVT prophylaxis LMWH Pressure ulcer(s): N/A GI prophylaxis: H2B Lines: N/A Foley:  NA  Code Status:  DNR with pre-arrest interventions   The patient is critically ill due to respiratory failure, encephalopathy.  Critical care was necessary to treat or prevent imminent or life-threatening deterioration.  Critical care was time spent personally by me on the following activities: development of treatment plan with patient  and/or surrogate as well as nursing, discussions with consultants, evaluation of patient's response to treatment, examination of patient, obtaining history from patient or surrogate, ordering and performing treatments and interventions, ordering and review of laboratory studies, ordering and review of radiographic studies, pulse oximetry, re-evaluation of patient's condition and participation in multidisciplinary rounds.   Critical Care Time devoted to patient care services described in this note is 35 minutes. This time reflects time of care of this signee Charlott Holler . This critical care time does not reflect separately billable procedures or procedure time, teaching time or supervisory time of PA/NP/Med student/Med Resident etc but could involve care discussion time.       Charlott Holler [facility] 11/23/2023 9:06 AM  Pager: see AMION  If no response to pager , please call critical care on call (see AMION) until 7pm After 7:00 pm call Elink

## 2023-11-23 NOTE — Progress Notes (Signed)
 NAME:  Garrett [name], MRN:  [id number], DOB:  [birth year], LOS: 27 ADMISSION DATE:  10/27/2023, CONSULTATION DATE:  10/27/2023 REFERRING MD:  Coralee Pesa, DO CHIEF COMPLAINT:  Found Down  History of Present Illness:  48 y/o male with past medical history of hypertension who was found down for unknown downtime by his fiance when she came home from work. Reportedly, having agonal breathing.  He apparently had driven her to work this morning.  He has HTN but never checks his BP and does not follow with MD.  She says you can tell when his BP is really high; his eyes get blood shot and he is sweating.  No h/o seizures.  Besides almost daily [name] and cigarette smoking, she is not aware of any other drugs. Drug screen positive for opioids and he was given several doses of Narcan. Patient found to have:  1. 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 Basal cisterns and fourth ventricle are effaced without hydrocephalus at this time. 2. Trace additional subarachnoid hemorrhage along the left parietal convexity. BP in ED 262/156. His CXR revealed a RUL and perihilar infiltrates suggesting aspiration pneumonia. ED labs also revealed PH 7.169, LA 4.4, CPK 985. Patient was intubated in ED.  Pertinent  Medical History  hypertension  Significant Hospital Events: Including procedures, antibiotic start and stop dates in addition to other pertinent events   3/8: Intubated in ED, pontine bleed/SAH. CT head 17:09 Acute large hemorrhage 4.1 cm in pons with intraventricular extension without hydrocephalus 3/9: CTA 03:14 AM No change in IPH in pons, similar biparietal [facility] 3/14: Now awake, appears locked in can follow commands with eyes. 3/16 Purposeful with left upper extremity  3/22 tracheostomy placed at bedside, bleeding issues overnight from trach site 3/24 PEG (Dr. Janee Morn) 3/24  sputum culture with MRSA 3/27 vomited. TF on hold. Abd film c/w ileus. Added reglan, got SSE. Did tolerate PSV all day  3/28 BMs x2 after SSE day prior. Added back TFs at 1/2 rated. Tolerated PS 3/29 Tolerated PS  3/31 Tolerating CPAP PS 15/5, TF on hold due to ileus. 4/1: con't to hold tube feeds today, restarting vancomycin and sending tracheal aspirate as peaks are 37, plat 24, driving 19. Thick secretions. Fever overnight.  4/2: peaks improved. Ongoing hiccups. Trickle feeding. Neuro exam unchanged.  Interim History / Subjective:  On spontaneous mode Hiccups resolved.  Per nurse can intermittently follow commands with LUE  Objective   Blood pressure 126/81, pulse 94, temperature 98.1 F (36.7 C), temperature source Axillary, resp. rate (!) 22, height 5\' 8"  (1.727 m), weight 97.9 kg, SpO2 100%.    Vent Mode: PSV;CPAP FiO2 (%):  [30 %] 30 % Set Rate:  [15 bmp] 15 bmp Vt Set:  [550 mL] 550 mL PEEP:  [5 cmH20] 5 cmH20 Pressure Support:  [5 cmH20-12 cmH20] 5 cmH20   Intake/Output Summary (Last 24 hours) at 11/23/2023 0848 Last data filed at 11/23/2023 4098 Gross per 24 hour  Intake 2252.06 ml  Output 1775 ml  Net 477.06 ml   Filed Weights   11/21/23 0500 11/22/23 0500 11/23/23 0500  Weight: 98 kg 98 kg 97.9 kg   Physical Exam: General: chronically ill appearing male, laying in bed HENT: Trach to vent, left eye erythematous, edematous, covered with gauze Respiratory: diminished, breath sounds of mechanical ventilation Cardiovascular: tachycardic, regular GI: soft, nontender, nondistended, PEG in place Extremities: diffuse anasarca  Neuro:  not awake or alert, extensor posturing  Resolved problem list   AKI Hypernatremia  Assessment & Plan:  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 CT head code stroke with large 4.1cm hemorrhage in pons with IV extension and extension into basal cisterns. Worsening mental  status/posturing on 3/29 however CT head stable. - stroke following, appreciate recs  - dvt ppx with lovenox  - no antithrombotic at this time  - 3% saline off and stable, but continued poor neuro exam  - SBP goal < 160, continue amlodipine 10mg  daily, metoprolol 100mg  BID, hydralazine 100mg  q8h, PRN labetalol  - frequent neuro checks  - neuroprotective measures with normothermia, euglycemia, hob >30, replete elytes  - PT/OT - anticipate LTACH need    Dysphagia Ileus PEG placed  - reglan started 3/31, continue pepcid - tube feeds at goal - protein supplementation    Acute hypoxic respiratory failure due to aspiration pneumonia - s/p tracheostomy placement 3/22. MRSA PNA - sputum culture 3/24.  Received 7 day course of vancomycin. Overnight 3/31 with ongoing fever. 4/1 exam with peaks 37, plat 24, driving 19. Thick secretions. Added vanc. 4/2 with improved pressures.  - tracheal aspirate w/ GPC  - con't vancomycin for total of 7 more days, end 4/9  - attempt vent weaning as able  - trach care  - con't robitussin BID for thinning secretions  - duoneb prn  - chest PT    Hyperchloremia  - receiving free water 200 q8h    Macrocytic anemia  Thrombocytosis  No report of chronic alcohol use. B12, folate and TSH normal - trend cbc  - transfuse <7    Poor Dentition upper right teeth and one left lower tooth have chipped off. - dental evaluation eventually   Best Practice (right click and "Reselect all SmartList Selections" daily)   Diet/type: tubefeeds and NPO DVT prophylaxis LMWH Pressure ulcer(s): N/A GI prophylaxis: H2B Lines: N/A Foley:  NA  Code Status:  DNR with pre-arrest interventions    The patient is critically ill due to respiratory failure, encephalopathy.  Critical care was necessary to treat or prevent imminent or life-threatening deterioration.  Critical care was time spent personally by me on the following activities: development of treatment plan with  patient and/or surrogate as well as nursing, discussions with consultants, evaluation of patient's response to treatment, examination of patient, obtaining history from patient or surrogate, ordering and performing treatments and interventions, ordering and review of laboratory studies, ordering and review of radiographic studies, pulse oximetry, re-evaluation of patient's condition and participation in multidisciplinary rounds.   Critical Care Time devoted to patient care services described in this note is 35 minutes. This time reflects time of care of this signee Charlott Holler . This critical care time does not reflect separately billable procedures or procedure time, teaching time or supervisory time of PA/NP/Med student/Med Resident etc but could involve care discussion time.       Charlott Holler [place] [facility] 11/23/2023 8:52 AM  Pager: see AMION  If no response to pager , please call critical care on call (see AMION) until 7pm After 7:00 pm call Elink

## 2023-11-24 DIAGNOSIS — R1310 Dysphagia, unspecified: Secondary | ICD-10-CM | POA: Diagnosis not present

## 2023-11-24 DIAGNOSIS — J15212 Pneumonia due to Methicillin resistant Staphylococcus aureus: Secondary | ICD-10-CM | POA: Diagnosis not present

## 2023-11-24 DIAGNOSIS — J208 Acute bronchitis due to other specified organisms: Secondary | ICD-10-CM | POA: Diagnosis not present

## 2023-11-24 DIAGNOSIS — I619 Nontraumatic intracerebral hemorrhage, unspecified: Secondary | ICD-10-CM | POA: Diagnosis not present

## 2023-11-24 DIAGNOSIS — J9601 Acute respiratory failure with hypoxia: Secondary | ICD-10-CM | POA: Diagnosis not present

## 2023-11-24 DIAGNOSIS — K567 Ileus, unspecified: Secondary | ICD-10-CM | POA: Diagnosis not present

## 2023-11-24 DIAGNOSIS — D75839 Thrombocytosis, unspecified: Secondary | ICD-10-CM | POA: Diagnosis not present

## 2023-11-24 DIAGNOSIS — I613 Nontraumatic intracerebral hemorrhage in brain stem: Secondary | ICD-10-CM | POA: Diagnosis not present

## 2023-11-24 DIAGNOSIS — E878 Other disorders of electrolyte and fluid balance, not elsewhere classified: Secondary | ICD-10-CM | POA: Diagnosis not present

## 2023-11-24 DIAGNOSIS — J690 Pneumonitis due to inhalation of food and vomit: Secondary | ICD-10-CM | POA: Diagnosis not present

## 2023-11-24 DIAGNOSIS — B9562 Methicillin resistant Staphylococcus aureus infection as the cause of diseases classified elsewhere: Secondary | ICD-10-CM | POA: Diagnosis not present

## 2023-11-24 DIAGNOSIS — H11412 Vascular abnormalities of conjunctiva, left eye: Secondary | ICD-10-CM | POA: Diagnosis not present

## 2023-11-24 LAB — CBC
HCT: 27.1 % — ABNORMAL LOW (ref 39.0–52.0)
Hemoglobin: 8.2 g/dL — ABNORMAL LOW (ref 13.0–17.0)
MCH: 31.4 pg (ref 26.0–34.0)
MCHC: 30.3 g/dL (ref 30.0–36.0)
MCV: 103.8 fL — ABNORMAL HIGH (ref 80.0–100.0)
Platelets: 629 10*3/uL — ABNORMAL HIGH (ref 150–400)
RBC: 2.61 MIL/uL — ABNORMAL LOW (ref 4.22–5.81)
RDW: 14.8 % (ref 11.5–15.5)
WBC: 13 10*3/uL — ABNORMAL HIGH (ref 4.0–10.5)
nRBC: 0 % (ref 0.0–0.2)

## 2023-11-24 LAB — GLUCOSE, CAPILLARY
Glucose-Capillary: 104 mg/dL — ABNORMAL HIGH (ref 70–99)
Glucose-Capillary: 107 mg/dL — ABNORMAL HIGH (ref 70–99)
Glucose-Capillary: 113 mg/dL — ABNORMAL HIGH (ref 70–99)
Glucose-Capillary: 115 mg/dL — ABNORMAL HIGH (ref 70–99)
Glucose-Capillary: 125 mg/dL — ABNORMAL HIGH (ref 70–99)
Glucose-Capillary: 131 mg/dL — ABNORMAL HIGH (ref 70–99)

## 2023-11-24 LAB — BASIC METABOLIC PANEL WITH GFR
Anion gap: 10 (ref 5–15)
BUN: 31 mg/dL — ABNORMAL HIGH (ref 6–20)
CO2: 21 mmol/L — ABNORMAL LOW (ref 22–32)
Calcium: 9.4 mg/dL (ref 8.9–10.3)
Chloride: 115 mmol/L — ABNORMAL HIGH (ref 98–111)
Creatinine, Ser: 1.13 mg/dL (ref 0.61–1.24)
GFR, Estimated: >60 mL/min (ref >60)
Glucose, Bld: 97 mg/dL (ref 70–99)
Potassium: 3.7 mmol/L (ref 3.5–5.1)
Sodium: 146 mmol/L — ABNORMAL HIGH (ref 135–145)

## 2023-11-24 LAB — VANCOMYCIN, RANDOM: Vancomycin Rm: >60 ug/mL

## 2023-11-24 LAB — VANCOMYCIN, TROUGH: Vancomycin Tr: 9 ug/mL — ABNORMAL LOW (ref 15–20)

## 2023-11-24 MED ORDER — FREE WATER
200.0000 mL | Status: DC
  Administered 2023-11-24 – 2023-11-27 (×18): 200 mL

## 2023-11-24 NOTE — Progress Notes (Signed)
 Patient placed on ATC 40% 12L per MD's order. Patient is tolerating well, vitals stable.

## 2023-11-24 NOTE — Plan of Care (Signed)
  Problem: Coping: Goal: Will identify appropriate support needs Outcome: Progressing   Problem: Health Behavior/Discharge Planning: Goal: Goals will be collaboratively established with patient/family Outcome: Progressing   Problem: Nutrition: Goal: Dietary intake will improve Outcome: Progressing   Problem: Metabolic: Goal: Ability to maintain appropriate glucose levels will improve Outcome: Progressing   Problem: Nutritional: Goal: Maintenance of adequate nutrition will improve Outcome: Progressing   Problem: Clinical Measurements: Goal: Ability to maintain clinical measurements within normal limits will improve Outcome: Progressing   Problem: Elimination: Goal: Will not experience complications related to bowel motility Outcome: Progressing   Problem: Pain Managment: Goal: General experience of comfort will improve and/or be controlled Outcome: Progressing   Problem: Self-Care: Goal: Ability to communicate needs accurately will improve Outcome: Not Progressing   Problem: Elimination: Goal: Will not experience complications related to urinary retention Outcome: Not Progressing

## 2023-11-24 NOTE — Progress Notes (Signed)
 NAME:  Garrett [name], MRN:  [id number], DOB:  Oct 08, 1975, LOS: 28 ADMISSION DATE:  10/27/2023, CONSULTATION DATE:  10/27/2023 REFERRING MD:  Coralee Pesa, DO CHIEF COMPLAINT:  Found Down  History of Present Illness:  48 y/o male with past medical history of hypertension who was found down for unknown downtime by his fiance when she came home from work. Reportedly, having agonal breathing.  He apparently had driven her to work this morning.  He has HTN but never checks his BP and does not follow with MD.  She says you can tell when his BP is really high; his eyes get blood shot and he is sweating.  No h/o seizures.  Besides almost daily [name] and cigarette smoking, she is not aware of any other drugs. Drug screen positive for opioids and he was given several doses of Narcan. Patient found to have:  1. 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 Basal cisterns and fourth ventricle are effaced without hydrocephalus at this time. 2. Trace additional subarachnoid hemorrhage along the left parietal convexity. BP in ED 262/156. His CXR revealed a RUL and perihilar infiltrates suggesting aspiration pneumonia. ED labs also revealed PH 7.169, LA 4.4, CPK 985. Patient was intubated in ED.  Pertinent  Medical History  hypertension  Significant Hospital Events: Including procedures, antibiotic start and stop dates in addition to other pertinent events   3/8: Intubated in ED, pontine bleed/SAH. CT head 17:09 Acute large hemorrhage 4.1 cm in pons with intraventricular extension without hydrocephalus 3/9: CTA 03:14 AM No change in IPH in pons, similar biparietal [facility] 3/14: Now awake, appears locked in can follow commands with eyes. 3/16 Purposeful with left upper extremity  3/22 tracheostomy placed at bedside, bleeding issues overnight from trach site 3/24 PEG (Dr. Janee Morn) 3/24  sputum culture with MRSA 3/27 vomited. TF on hold. Abd film c/w ileus. Added reglan, got SSE. Did tolerate PSV all day  3/28 BMs x2 after SSE day prior. Added back TFs at 1/2 rated. Tolerated PS 3/29 Tolerated PS  3/31 Tolerating CPAP PS 15/5, TF on hold due to ileus. 4/1: con't to hold tube feeds today, restarting vancomycin and sending tracheal aspirate as peaks are 37, plat 24, driving 19. Thick secretions. Fever overnight.  4/2: peaks improved. Ongoing hiccups. Trickle feeding. Neuro exam unchanged.  Interim History / Subjective:  This morning following commands. Afebrile.  Objective   Blood pressure 122/82, pulse 93, temperature 98.5 F (36.9 C), temperature source Axillary, resp. rate (!) 23, height 5\' 8"  (1.727 m), weight 97.9 kg, SpO2 99%.    Vent Mode: PRVC FiO2 (%):  [30 %] 30 % Set Rate:  [15 bmp] 15 bmp Vt Set:  [550 mL] 550 mL PEEP:  [5 cmH20] 5 cmH20 Plateau Pressure:  [17 cmH20-18 cmH20] 17 cmH20   Intake/Output Summary (Last 24 hours) at 11/24/2023 1027 Last data filed at 11/24/2023 0900 Gross per 24 hour  Intake 1999.89 ml  Output 1600 ml  Net 399.89 ml   Filed Weights   11/21/23 0500 11/22/23 0500 11/23/23 0500  Weight: 98 kg 98 kg 97.9 kg   Physical Exam: Gen:      Acutely and chronically ill appearing HEENT:  trach to vent, left eye conjunctival irritation and swelling Lungs:    sounds of mechanical ventilation auscultated, no wheeze CV:         tachycardic, regular Abd:      +  bowel sounds; soft, non-tender; no palpable masses, no distension Ext:    No edema Skin:      Warm and dry; no rashes Neuro:   moves LUE spontaneously, able to blink on command and move LLE toes on command for me today. Very weak.   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 CT head code stroke with large 4.1cm hemorrhage in pons with IV extension and  extension into basal cisterns. Worsening mental status/posturing on 3/29 however CT head stable. - stroke following, appreciate recs  - dvt ppx with lovenox  - no antithrombotic at this time  - 3% saline off and stable, but continued poor neuro exam  - SBP goal < 160, continue amlodipine 10mg  daily, metoprolol 100mg  BID, hydralazine 100mg  q8h, PRN labetalol  - frequent neuro checks  - neuroprotective measures with normothermia, euglycemia, hob >30, replete elytes  - PT/OT - anticipate LTACH need  - PS trials as tolerated. Wonder if he will be able to trach collar or not.   Dysphagia Ileus PEG placed  - reglan started 3/31, continue pepcid - tube feeds at goal - protein supplementation    Acute hypoxic respiratory failure due to aspiration pneumonia - s/p tracheostomy placement 3/22. MRSA PNA - sputum culture 3/24.  Received 7 day course of vancomycin. Overnight 3/31 with ongoing fever. 4/1 exam with peaks 37, plat 24, driving 19. Thick secretions. Added vanc. 4/2 with improved pressures.  - tracheal aspirate w/ GPC  - con't vancomycin for total of 7 more days, end 4/9  - attempt vent weaning as able  - trach care  - con't robitussin BID for thinning secretions  - duoneb prn  - chest PT    Hyperchloremia  - receiving free water 200 q8h    Macrocytic anemia  Thrombocytosis  No report of chronic alcohol use. B12, folate and TSH normal - trend cbc  - transfuse <7    Poor Dentition upper right teeth and one left lower tooth have chipped off. - dental evaluation eventually   Left eye redness - on erythromycin drops. Doesn't seem to be improving.  - consider ophtho consult monday  Best Practice (right click and "Reselect all SmartList Selections" daily)   Diet/type: tubefeeds and NPO DVT prophylaxis LMWH Pressure ulcer(s): N/A GI prophylaxis: H2B Lines: N/A Foley:  NA  Code Status:  DNR with pre-arrest interventions   The patient is critically ill due to respiratory  failure, stroke.  Critical care was necessary to treat or prevent imminent or life-threatening deterioration.  Critical care was time spent personally by me on the following activities: development of treatment plan with patient and/or surrogate as well as nursing, discussions with consultants, evaluation of patient's response to treatment, examination of patient, obtaining history from patient or surrogate, ordering and performing treatments and interventions, ordering and review of laboratory studies, ordering and review of radiographic studies, pulse oximetry, re-evaluation of patient's condition and participation in multidisciplinary rounds.   Critical Care Time devoted to patient care services described in this note is 32 minutes. This time reflects time of care of this signee Charlott Holler . This critical care time does not reflect separately billable procedures or procedure time, teaching time or supervisory time of PA/NP/Med student/Med Resident etc but could involve care discussion time.       [facility]  11/24/2023 10:27 AM  Pager: see AMION  If no response to pager , please call critical care on call (see AMION) until 7pm After 7:00 pm call Elink

## 2023-11-24 NOTE — Plan of Care (Signed)
  Problem: Nutrition: Goal: Risk of aspiration will decrease Outcome: Progressing   Problem: Metabolic: Goal: Ability to maintain appropriate glucose levels will improve Outcome: Progressing   Problem: Nutritional: Goal: Maintenance of adequate nutrition will improve Outcome: Progressing

## 2023-11-25 DIAGNOSIS — D539 Nutritional anemia, unspecified: Secondary | ICD-10-CM | POA: Diagnosis not present

## 2023-11-25 DIAGNOSIS — E876 Hypokalemia: Secondary | ICD-10-CM | POA: Diagnosis not present

## 2023-11-25 DIAGNOSIS — J690 Pneumonitis due to inhalation of food and vomit: Secondary | ICD-10-CM | POA: Diagnosis not present

## 2023-11-25 DIAGNOSIS — I619 Nontraumatic intracerebral hemorrhage, unspecified: Secondary | ICD-10-CM | POA: Diagnosis not present

## 2023-11-25 DIAGNOSIS — D75839 Thrombocytosis, unspecified: Secondary | ICD-10-CM | POA: Diagnosis not present

## 2023-11-25 DIAGNOSIS — I613 Nontraumatic intracerebral hemorrhage in brain stem: Secondary | ICD-10-CM | POA: Diagnosis not present

## 2023-11-25 DIAGNOSIS — I10 Essential (primary) hypertension: Secondary | ICD-10-CM | POA: Diagnosis not present

## 2023-11-25 DIAGNOSIS — J15212 Pneumonia due to Methicillin resistant Staphylococcus aureus: Secondary | ICD-10-CM | POA: Diagnosis not present

## 2023-11-25 DIAGNOSIS — K567 Ileus, unspecified: Secondary | ICD-10-CM | POA: Diagnosis not present

## 2023-11-25 DIAGNOSIS — R1310 Dysphagia, unspecified: Secondary | ICD-10-CM | POA: Diagnosis not present

## 2023-11-25 DIAGNOSIS — J9601 Acute respiratory failure with hypoxia: Secondary | ICD-10-CM | POA: Diagnosis not present

## 2023-11-25 DIAGNOSIS — Z930 Tracheostomy status: Secondary | ICD-10-CM | POA: Diagnosis not present

## 2023-11-25 LAB — BASIC METABOLIC PANEL WITH GFR
Anion gap: 10 (ref 5–15)
BUN: 25 mg/dL — ABNORMAL HIGH (ref 6–20)
CO2: 21 mmol/L — ABNORMAL LOW (ref 22–32)
Calcium: 9.2 mg/dL (ref 8.9–10.3)
Chloride: 113 mmol/L — ABNORMAL HIGH (ref 98–111)
Creatinine, Ser: 0.98 mg/dL (ref 0.61–1.24)
GFR, Estimated: >60 mL/min (ref >60)
Glucose, Bld: 117 mg/dL — ABNORMAL HIGH (ref 70–99)
Potassium: 3.6 mmol/L (ref 3.5–5.1)
Sodium: 144 mmol/L (ref 135–145)

## 2023-11-25 LAB — GLUCOSE, CAPILLARY
Glucose-Capillary: 94 mg/dL (ref 70–99)
Glucose-Capillary: 119 mg/dL — ABNORMAL HIGH (ref 70–99)
Glucose-Capillary: 133 mg/dL — ABNORMAL HIGH (ref 70–99)
Glucose-Capillary: 103 mg/dL — ABNORMAL HIGH (ref 70–99)
Glucose-Capillary: 108 mg/dL — ABNORMAL HIGH (ref 70–99)
Glucose-Capillary: 112 mg/dL — ABNORMAL HIGH (ref 70–99)

## 2023-11-25 LAB — VANCOMYCIN, RANDOM: Vancomycin Rm: 40 ug/mL

## 2023-11-25 MED ORDER — POTASSIUM CHLORIDE 20 MEQ PO PACK
40.0000 meq | PACK | Freq: Once | ORAL | Status: AC
  Administered 2023-11-25: 40 meq
  Filled 2023-11-25: qty 2

## 2023-11-25 NOTE — Progress Notes (Signed)
 [facility] ADULT ICU REPLACEMENT PROTOCOL   The patient does apply for the [facility] Adult ICU Electrolyte Replacment Protocol based on the criteria listed below:   1.Exclusion criteria: TCTS, ECMO, Dialysis, and Myasthenia Gravis patients 2. Is GFR >/= 30 ml/min? Yes.    Patient's GFR today is >60 3. Is SCr </= 2? Yes.   Patient's SCr is 0.98 mg/dL 4. Did SCr increase >/= 0.5 in 24 hours? No. 5.Pt's weight >40kg  Yes.   6. Abnormal electrolyte(s): K  7. Electrolytes replaced per protocol 8.  Call MD STAT for K+ </= 2.5, Phos </= 1, or Mag </= 1 Physician:  Flossie Buffy E Arlie Posch 11/25/2023 6:07 AM

## 2023-11-25 NOTE — Progress Notes (Signed)
 NAME:  Nabeel Gladson, MRN:  [id number], DOB:  1976-04-06, LOS: 29 ADMISSION DATE:  10/27/2023, CONSULTATION DATE:  10/27/2023 REFERRING MD:  Coralee Pesa, DO CHIEF COMPLAINT:  Found Down  History of Present Illness:  48 y/o male with past medical history of hypertension who was found down for unknown downtime by his fiance when she came home from work. Reportedly, having agonal breathing.  He apparently had driven her to work this morning.  He has HTN but never checks his BP and does not follow with MD.  She says you can tell when his BP is really high; his eyes get blood shot and he is sweating.  No h/o seizures.  Besides almost daily [name] and cigarette smoking, she is not aware of any other drugs. Drug screen positive for opioids and he was given several doses of Narcan. Patient found to have:  1. 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 Basal cisterns and fourth ventricle are effaced without hydrocephalus at this time. 2. Trace additional subarachnoid hemorrhage along the left parietal convexity. BP in ED 262/156. His CXR revealed a RUL and perihilar infiltrates suggesting aspiration pneumonia. ED labs also revealed PH 7.169, LA 4.4, CPK 985. Patient was intubated in ED.  Pertinent  Medical History  hypertension  Significant Hospital Events: Including procedures, antibiotic start and stop dates in addition to other pertinent events   3/8: Intubated in ED, pontine bleed/SAH. CT head 17:09 Acute large hemorrhage 4.1 cm in pons with intraventricular extension without hydrocephalus 3/9: CTA 03:14 AM No change in IPH in pons, similar biparietal [facility] 3/14: Now awake, appears locked in can follow commands with eyes. 3/16 Purposeful with left upper extremity  3/22 tracheostomy placed at bedside, bleeding issues overnight from trach site 3/24 PEG (Dr. Janee Morn) 3/24  sputum culture with MRSA 3/27 vomited. TF on hold. Abd film c/w ileus. Added reglan, got SSE. Did tolerate PSV all day  3/28 BMs x2 after SSE day prior. Added back TFs at 1/2 rated. Tolerated PS 3/29 Tolerated PS  3/31 Tolerating CPAP PS 15/5, TF on hold due to ileus. 4/1: con't to hold tube feeds today, restarting vancomycin and sending tracheal aspirate as peaks are 37, plat 24, driving 19. Thick secretions. Fever overnight.  4/2: peaks improved. Ongoing hiccups. Trickle feeding. Neuro exam unchanged.  Interim History / Subjective:  No overnight issues. Tolerating TC x 24 hours.   Objective   Blood pressure 101/70, pulse 86, temperature 99 F (37.2 C), temperature source Axillary, resp. rate (!) 24, height 5\' 8"  (1.727 m), weight 100.7 kg, SpO2 99%.    FiO2 (%):  [40 %] 40 %   Intake/Output Summary (Last 24 hours) at 11/25/2023 1048 Last data filed at 11/25/2023 1000 Gross per 24 hour  Intake 2450.09 ml  Output 900 ml  Net 1550.09 ml   Filed Weights   11/23/23 0500 11/24/23 0701 11/25/23 0500  Weight: 97.9 kg 96.5 kg 100.7 kg   Physical Exam: Gen:      Intubated, sedated, acutely ill appearing HEENT:  trach collar, left eye in dressing due to conjunctival edema irritation Lungs:    diminished, no wheeze CV:         RRR Abd:      + bowel sounds; soft, non-tender; no palpable masses, no distension Ext:    No edema Skin:      Warm and dry; no rashes Neuro:  not sedated, follows eyes by blink on demand, can move thumb in LUE    Resolved problem list   AKI Hypernatremia  Assessment & Plan:  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 CT head code stroke with large 4.1cm hemorrhage in pons with IV extension and extension into basal cisterns. Worsening mental status/posturing on 3/29 however CT head stable. - stroke following, appreciate recs  - dvt ppx with lovenox  - no antithrombotic at this time  - 3% saline off and  stable, but continued poor neuro exam  - SBP goal < 160, continue amlodipine 10mg  daily, metoprolol 100mg  BID, hydralazine 100mg  q8h, PRN labetalol  - frequent neuro checks  - neuroprotective measures with normothermia, euglycemia, hob >30, replete elytes  - PT/OT - anticipate LTACH need  - TC as tolerated   Dysphagia Ileus PEG placed  - reglan started 3/31, continue pepcid - tube feeds at goal - protein supplementation    Acute hypoxic respiratory failure due to aspiration pneumonia - s/p tracheostomy placement 3/22. MRSA PNA - sputum culture 3/24.  Received 7 day course of vancomycin. Overnight 3/31 with ongoing fever. 4/1 exam with peaks 37, plat 24, driving 19. Thick secretions. Added vanc. 4/2 with improved pressures.  - tracheal aspirate w/ GPC  - con't vancomycin for total of 7 more days, end 4/9  - attempt vent weaning as able  - trach care  - con't robitussin BID for thinning secretions  - duoneb prn  - chest PT    Hyperchloremia  - receiving free water 200 q8h    Macrocytic anemia  Thrombocytosis  No report of chronic alcohol use. B12, folate and TSH normal - trend cbc  - transfuse <7    Poor Dentition upper right teeth and one left lower tooth have chipped off. - dental evaluation eventually   Left eye redness - on erythromycin drops. Doesn't seem to be improving.  - consider ophtho consult monday  Best Practice (right click and "Reselect all SmartList Selections" daily)   Diet/type: tubefeeds and NPO DVT prophylaxis LMWH Pressure ulcer(s): N/A GI prophylaxis: H2B Lines: N/A Foley:  NA  Code Status:  DNR with pre-arrest interventions   The patient is critically ill due to respiratory failure.  Critical care was necessary to treat or prevent imminent or life-threatening deterioration.  Critical care was time spent personally by me on the following activities: development of treatment plan with patient and/or surrogate as well as nursing, discussions with  consultants, evaluation of patient's response to treatment, examination of patient, obtaining history from patient or surrogate, ordering and performing treatments and interventions, ordering and review of laboratory studies, ordering and review of radiographic studies, pulse oximetry, re-evaluation of patient's condition and participation in multidisciplinary rounds.   Critical Care Time devoted to patient care services described in this note is 32 minutes. This time reflects time of care of this signee Charlott Holler . This critical care time does not reflect separately billable procedures or procedure time, teaching time or supervisory time of PA/NP/Med student/Med Resident etc but could involve care discussion time.       Charlott Holler [place] [facility] 11/25/2023 10:50 AM  Pager: see AMION  If no response to pager , please call critical care on call (see AMION) until 7pm After 7:00 pm call Elink

## 2023-11-25 NOTE — Progress Notes (Addendum)
 Pt coughing with copious amounts of tan pink tinged secretions. Suction x 3. Fio2 increased to 50%. After coughing stopped pt sats remained 88-89% with increased WOB. Pt placed back on the vent with full support. Pt appears more comfortable after. Sats 96%. RN made aware, will continue to monitor.

## 2023-11-26 ENCOUNTER — Inpatient Hospital Stay (HOSPITAL_COMMUNITY): Payer: MEDICAID

## 2023-11-26 DIAGNOSIS — E876 Hypokalemia: Secondary | ICD-10-CM | POA: Diagnosis not present

## 2023-11-26 DIAGNOSIS — Z930 Tracheostomy status: Secondary | ICD-10-CM | POA: Diagnosis not present

## 2023-11-26 DIAGNOSIS — R1310 Dysphagia, unspecified: Secondary | ICD-10-CM | POA: Diagnosis not present

## 2023-11-26 DIAGNOSIS — I10 Essential (primary) hypertension: Secondary | ICD-10-CM | POA: Diagnosis not present

## 2023-11-26 DIAGNOSIS — K567 Ileus, unspecified: Secondary | ICD-10-CM | POA: Diagnosis not present

## 2023-11-26 DIAGNOSIS — E870 Hyperosmolality and hypernatremia: Secondary | ICD-10-CM | POA: Diagnosis not present

## 2023-11-26 DIAGNOSIS — H11412 Vascular abnormalities of conjunctiva, left eye: Secondary | ICD-10-CM | POA: Diagnosis not present

## 2023-11-26 DIAGNOSIS — J15212 Pneumonia due to Methicillin resistant Staphylococcus aureus: Secondary | ICD-10-CM | POA: Diagnosis not present

## 2023-11-26 DIAGNOSIS — I613 Nontraumatic intracerebral hemorrhage in brain stem: Secondary | ICD-10-CM | POA: Diagnosis not present

## 2023-11-26 DIAGNOSIS — J9601 Acute respiratory failure with hypoxia: Secondary | ICD-10-CM | POA: Diagnosis not present

## 2023-11-26 DIAGNOSIS — H109 Unspecified conjunctivitis: Secondary | ICD-10-CM | POA: Diagnosis not present

## 2023-11-26 LAB — BASIC METABOLIC PANEL WITH GFR
Anion gap: 11 (ref 5–15)
BUN: 26 mg/dL — ABNORMAL HIGH (ref 6–20)
CO2: 20 mmol/L — ABNORMAL LOW (ref 22–32)
Calcium: 9.3 mg/dL (ref 8.9–10.3)
Chloride: 112 mmol/L — ABNORMAL HIGH (ref 98–111)
Creatinine, Ser: 0.95 mg/dL (ref 0.61–1.24)
GFR, Estimated: >60 mL/min (ref >60)
Glucose, Bld: 111 mg/dL — ABNORMAL HIGH (ref 70–99)
Potassium: 4.2 mmol/L (ref 3.5–5.1)
Sodium: 143 mmol/L (ref 135–145)

## 2023-11-26 LAB — GLUCOSE, CAPILLARY
Glucose-Capillary: 107 mg/dL — ABNORMAL HIGH (ref 70–99)
Glucose-Capillary: 118 mg/dL — ABNORMAL HIGH (ref 70–99)
Glucose-Capillary: 96 mg/dL (ref 70–99)
Glucose-Capillary: 111 mg/dL — ABNORMAL HIGH (ref 70–99)
Glucose-Capillary: 93 mg/dL (ref 70–99)
Glucose-Capillary: 93 mg/dL (ref 70–99)

## 2023-11-26 MED ORDER — ARTIFICIAL TEARS OPHTHALMIC OINT
TOPICAL_OINTMENT | Freq: Three times a day (TID) | OPHTHALMIC | Status: DC
  Administered 2023-11-26 – 2024-02-26 (×42): 1 via OPHTHALMIC
  Filled 2023-11-26 (×12): qty 3.5

## 2023-11-26 MED ORDER — SODIUM CHLORIDE 3 % IN NEBU
4.0000 mL | INHALATION_SOLUTION | Freq: Two times a day (BID) | RESPIRATORY_TRACT | Status: DC
  Administered 2023-11-26 (×2): 4 mL via RESPIRATORY_TRACT
  Filled 2023-11-26 (×2): qty 4

## 2023-11-26 NOTE — Progress Notes (Signed)
 Pharmacy Antibiotic Note  Garrett [name] is a 48 y.o. male admitted on 10/27/2023 with pna.  Pharmacy has been consulted for vancomycin dosing.  Previously on vancomycin 1250 mg IV q 24h through 3/31, dose based on levels 3/27 and similar renal function, last Cx with abundant MRSA.  Calculated AUC 475.6 of levels from this weekend - at goal. Continue at current dose. Therapy ends 4.9.  WBC trend down, afebrile.   Plan: Continue vancomycin 1250 mg IV q 24h Monitor renal function, repeat TA Cx and clinical progression for LOT Vancomycin levels as indicated  Height: 5\' 8"  (172.7 cm) Weight: 100 kg (220 lb 7.4 oz) IBW/kg (Calculated) : 68.4  Temp (24hrs), Avg:99.2 F (37.3 C), Min:98.4 F (36.9 C), Max:99.8 F (37.7 C)  Recent Labs  Lab 11/19/23 0824 11/20/23 0644 11/21/23 0332 11/23/23 1135 11/24/23 1106 11/24/23 1258 11/24/23 1506 11/25/23 0505 11/25/23 1430 11/26/23 0711  WBC 14.7* 15.2* 14.2*  --  13.0*  --   --   --   --   --   CREATININE 1.02 1.18 1.05  --  1.13  --   --  0.98  --  0.95  VANCOTROUGH  --   --   --  9*  --  9*  --   --   --   --   VANCORANDOM  --   --   --   --   --   --  >60*  --  40  --     Estimated Creatinine Clearance: 110.1 mL/min (by C-G formula based on SCr of 0.95 mg/dL).    Allergies  Allergen Reactions   Tomato Hives, Itching and Rash    Link Snuffer, PharmD, BCPS, BCCCP Please refer to [facility] for [facility] numbers 11/26/2023 8:13 AM

## 2023-11-26 NOTE — Progress Notes (Signed)
 Patient placed back on vent due to continuous coughing spells, increased WOB and RR. Patient tolerating well at this time. RT will continue to monitor.

## 2023-11-26 NOTE — Consult Note (Signed)
 CC:  Chief Complaint  Patient presents with   Unresponsive    HPI: Garrett [name] is a 48 y.o. male w/ unknown POH and PMH here for chronic admission following pontine stroke. Ophthalmology consulted for chronic injection OS. Having to use ointment q12 hours and patching/taping eye closed. Pt sedated and unable to answer questions  ROS: unable  PMH: History reviewed. No pertinent past medical history.  PSH: History reviewed. No pertinent surgical history.  Meds: No current facility-administered medications on file prior to encounter.   Current Outpatient Medications on File Prior to Encounter  Medication Sig Dispense Refill   acetaminophen (TYLENOL) 500 MG tablet Take 1,000 mg by mouth every 6 (six) hours as needed for mild pain (pain score 1-3) or headache.     ibuprofen (ADVIL) 200 MG tablet Take 600 mg by mouth every 6 (six) hours as needed for mild pain (pain score 1-3).      SH: Social History   Socioeconomic History   Marital status: Single    Spouse name: Not on file   Number of children: Not on file   Years of education: Not on file   Highest education level: Not on file  Occupational History   Not on file  Tobacco Use   Smoking status: Every Day    Current packs/day: 0.50    Average packs/day: 0.5 packs/day for 30.3 years (15.1 ttl pk-yrs)    Types: Cigarettes    Start date: 1995   Smokeless tobacco: Never  Substance and Sexual Activity   Alcohol use: Not Currently   Drug use: Never   Sexual activity: Not on file  Other Topics Concern   Not on file  Social History Narrative   Not on file   Social Drivers of Health   Financial Resource Strain: Not on file  Food Insecurity: Patient Unable To Answer (10/29/2023)   Hunger Vital Sign    Worried About Running Out of Food in the Last Year: Patient unable to answer    Ran Out of Food in the Last Year: Patient unable to answer  Transportation Needs: Not on file  Physical Activity: Not on file  Stress:  Not on file  Social Connections: Unknown (06/23/2023)   Received from [employer]   Social Network    Social Network: Not on file    FH: History reviewed. No pertinent family history.  Exam:  Zenaida Niece: OU: sedated, unable  CVF: OU: sedated, unable  EOM: OU: sedated, unable  Pupils: OD: 1 mm, unable to assess APD OS: 1 mm, unable to assess APD  IOP: tactile soft OU  External: OD: no periorbital edema, no proptosis OS: no periorbital edema, no proptosis, 1-2 mm lagophthalmos    Pen Light Exam: L/L: OD: WNL OS: mild lag w/ exposure inferior conj visible  C/S: OD: white and quiet OS: 1+ injection  K: OD: clear, no abnormal staining OS: punctate changes inferiorly  A/C: OD: grossly deep and quiet appearing by pen light OS: grossly deep and quiet appearing by pen light  I: OD: round and regular OS: round and regular  L: OD: clear OS: clear  DFE: dilated @ 4:50 w/ Tropic and Phenyl OU  DFE unable - even after dilating drops still pinpoint.   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  Ladasha Schnackenberg T. Sherryll Burger, MD [facility]

## 2023-11-26 NOTE — Progress Notes (Signed)
 SLP Cancellation Note  Patient Details Name: Garrett [name] MRN: [id number] DOB: 09-04-1975   Cancelled treatment:       Reason Eval/Treat Not Completed: Patient not medically ready (currently on vent, but has started attempting ATC). Will continue to follow.     Mahala Menghini., M.A. CCC-SLP Acute Rehabilitation Services Office [phone]  Secure chat preferred  11/26/2023, 11:13 AM

## 2023-11-26 NOTE — TOC Progression Note (Signed)
 Transition of Care [facility]) - Progression Note    Patient Details  Name: Garrett [name] MRN: [id number] Date of Birth: [birth year]  Transition of Care [facility]) CM/SW Contact  Mearl Latin, LCSW Phone Number: 11/26/2023, 5:05 PM  Clinical Narrative:    [facility] is reviewing patient for clinical acceptance to see if patient can be added to their waitlist.    Expected Discharge Plan: Long Term Nursing Home Barriers to Discharge: Continued Medical Work up, SNF Pending Medicaid, SNF Pending bed offer, SNF Pending payor source - LOG, Inadequate or no insurance (New trach)  Expected Discharge Plan and Services In-house Referral: Clinical Social Work, Hospice / Palliative Care   Post Acute Care Choice: Skilled Nursing Facility Living arrangements for the past 2 months: Single Family Home                                       Social Determinants of Health (SDOH) Interventions SDOH Screenings   Food Insecurity: Patient Unable To Answer (10/29/2023)  Social Connections: Unknown (06/23/2023)   Received from [facility]  Tobacco Use: High Risk (10/31/2023)    Readmission Risk Interventions     No data to display

## 2023-11-26 NOTE — Plan of Care (Signed)
  Problem: Education: Goal: Knowledge of disease or condition will improve Outcome: Not Progressing Goal: Knowledge of secondary prevention will improve (MUST DOCUMENT ALL) Outcome: Not Progressing Goal: Knowledge of patient specific risk factors will improve (DELETE if not current risk factor) Outcome: Not Progressing   Problem: Intracerebral Hemorrhage Tissue Perfusion: Goal: Complications of Intracerebral Hemorrhage will be minimized Outcome: Not Progressing   Problem: Coping: Goal: Will verbalize positive feelings about self Outcome: Not Progressing Goal: Will identify appropriate support needs Outcome: Not Progressing   Problem: Health Behavior/Discharge Planning: Goal: Ability to manage health-related needs will improve Outcome: Not Progressing Goal: Goals will be collaboratively established with patient/family Outcome: Not Progressing   Problem: Self-Care: Goal: Ability to participate in self-care as condition permits will improve Outcome: Not Progressing Goal: Verbalization of feelings and concerns over difficulty with self-care will improve Outcome: Not Progressing Goal: Ability to communicate needs accurately will improve Outcome: Not Progressing   Problem: Coping: Goal: Ability to adjust to condition or change in health will improve Outcome: Not Progressing   Problem: Fluid Volume: Goal: Ability to maintain a balanced intake and output will improve Outcome: Not Progressing

## 2023-11-26 NOTE — Progress Notes (Addendum)
 Physical Therapy Treatment Patient Details Name: Garrett [name] MRN: [id number] DOB: [birth year] Today's Date: 11/26/2023   History of Present Illness Pt is a 48 yo male who was found down 10/27/23 with agonal breathing. 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 Intubated 3/8, cortrak placed 3/14, trach placed 3/22, PEG placed 3/24. PMH: HTN, smoker    PT Comments  Pt following a few commands for thumbs up and holding up 2 fingers on the left hand. Requiring two person total assist for supine <> sit. Tolerated sitting EOB for > 15 minutes to promote upright, improve respiratory status, and address static sitting balance. Will continue to progress as tolerated.    If plan is discharge home, recommend the following: Two people to help with walking and/or transfers;Two people to help with bathing/dressing/bathroom;Assistance with cooking/housework;Assistance with feeding;Direct supervision/assist for medications management;Direct supervision/assist for financial management;Assist for transportation;Help with stairs or ramp for entrance;Supervision due to cognitive status   Can travel by private vehicle        Equipment Recommendations  [place] lift;Hospital bed;Wheelchair (measurements PT);Wheelchair cushion (measurements PT);BSC/3in1    Recommendations for Other Services       Precautions / Restrictions Precautions Precautions: Fall;Other (comment) Recall of Precautions/Restrictions: Impaired Precaution/Restrictions Comments: Trach on vent, PEG, abdominal binder, SBP < 160 Restrictions Weight Bearing Restrictions Per Provider Order: No     Mobility  Bed Mobility Overal bed mobility: Needs Assistance Bed Mobility: Supine to Sit, Sit to Supine     Supine to sit: Total assist, +2 for physical assistance Sit to supine:  Total assist, +2 for physical assistance   General bed mobility comments: TotalA + 2 for supine<> sit    Transfers                   General transfer comment: deferred    Ambulation/Gait                   Stairs             Wheelchair Mobility     Tilt Bed    Modified Rankin (Stroke Patients Only) Modified Rankin (Stroke Patients Only) Pre-Morbid Rankin Score: No symptoms Modified Rankin: Severe disability     Balance Overall balance assessment: Needs assistance Sitting-balance support: Feet supported Sitting balance-Leahy Scale: Poor Sitting balance - Comments: MaxA for static sitting balance, sits with neck flexed                                    Communication Communication Communication: Impaired Factors Affecting Communication: Trach/intubated  Cognition Arousal: Alert Behavior During Therapy: Flat affect   PT - Cognitive impairments: Difficult to assess Difficult to assess due to: Intubated                     PT - Cognition Comments: Pt follows command to give thumbs up on L and hold up 2 fingers with delay. Following commands: Impaired Following commands impaired: Follows one step commands inconsistently    Cueing Cueing Techniques: Verbal cues, Tactile cues  Exercises General Exercises - Lower Extremity Long Arc Quad: PROM, Both, 5 reps, Seated Hip Flexion/Marching: PROM, Both, 5 reps, Seated Other Exercises Other Exercises: Sitting: passive truncal rotation to R/L x 3 each side, hand over hand guidance for rubbing  lotion onto legs to promote anterior lean, shoulder shrugs with tactile facilitation x 5    General Comments        Pertinent Vitals/Pain Pain Assessment Pain Assessment: CPOT Facial Expression: Relaxed, neutral Body Movements: Absence of movements Muscle Tension: Relaxed Compliance with ventilator (intubated pts.): N/A Vocalization (extubated pts.): N/A CPOT Total: 0  BP 114/73  (85), SpO2 99% on vent, 40% FiO2, 5 PEEP, HR 82    Home Living                          Prior Function            PT Goals (current goals can now be found in the care plan section) Acute Rehab PT Goals Patient Stated Goal: unable to state Potential to Achieve Goals: Fair Progress towards PT goals: Not progressing toward goals - comment    Frequency    Min 2X/week      PT Plan      Co-evaluation              AM-PAC PT "6 Clicks" Mobility   Outcome Measure  Help needed turning from your back to your side while in a flat bed without using bedrails?: Total Help needed moving from lying on your back to sitting on the side of a flat bed without using bedrails?: Total Help needed moving to and from a bed to a chair (including a wheelchair)?: Total Help needed standing up from a chair using your arms (e.g., wheelchair or bedside chair)?: Total Help needed to walk in hospital room?: Total Help needed climbing 3-5 steps with a railing? 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

## 2023-11-26 NOTE — Progress Notes (Signed)
 NAME:  Garrett [name], MRN:  [id number], DOB:  1975/08/24, LOS: 30 ADMISSION DATE:  10/27/2023, CONSULTATION DATE:  10/27/2023 REFERRING MD:  Coralee Pesa, DO CHIEF COMPLAINT:  Found Down  History of Present Illness:  48 y/o male with past medical history of hypertension who was found down for unknown downtime by his fiance when she came home from work. Reportedly, having agonal breathing.  He apparently had driven her to work this morning.  He has HTN but never checks his BP and does not follow with MD.  She says you can tell when his BP is really high; his eyes get blood shot and he is sweating.  No h/o seizures.  Besides almost daily [name] and cigarette smoking, she is not aware of any other drugs. Drug screen positive for opioids and he was given several doses of Narcan. Patient found to have:  1. 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 Basal cisterns and fourth ventricle are effaced without hydrocephalus at this time. 2. Trace additional subarachnoid hemorrhage along the left parietal convexity. BP in ED 262/156. His CXR revealed a RUL and perihilar infiltrates suggesting aspiration pneumonia. ED labs also revealed PH 7.169, LA 4.4, CPK 985. Patient was intubated in ED.  Pertinent  Medical History  hypertension  Significant Hospital Events: Including procedures, antibiotic start and stop dates in addition to other pertinent events   3/8: Intubated in ED, pontine bleed/SAH. CT head 17:09 Acute large hemorrhage 4.1 cm in pons with intraventricular extension without hydrocephalus 3/9: CTA 03:14 AM No change in IPH in pons, similar biparietal [facility] 3/14: Now awake, appears locked in can follow commands with eyes. 3/16 Purposeful with left upper extremity  3/22 tracheostomy placed at bedside, bleeding issues overnight from trach site 3/24 PEG (Dr. Janee Morn) 3/24  sputum culture with MRSA 3/27 vomited. TF on hold. Abd film c/w ileus. Added reglan, got SSE. Did tolerate PSV all day  3/28 BMs x2 after SSE day prior. Added back TFs at 1/2 rated. Tolerated PS 3/29 Tolerated PS  3/31 Tolerating CPAP PS 15/5, TF on hold due to ileus. 4/1: con't to hold tube feeds today, restarting vancomycin and sending tracheal aspirate as peaks are 37, plat 24, driving 19. Thick secretions. Fever overnight.  4/2: peaks improved. Ongoing hiccups. Trickle feeding. Neuro exam unchanged.   Interim History / Subjective:  Failed TC last night due to heavy secretions, agitation requiring PRN sedation leading to underventilation and being placed back on vent.  Objective   Blood pressure (!) 144/82, pulse 86, temperature 99.2 F (37.3 C), temperature source Axillary, resp. rate (!) 28, height 5\' 8"  (1.727 m), weight 100 kg, SpO2 99%.    Vent Mode: PRVC FiO2 (%):  [30 %-60 %] 40 % Set Rate:  [15 bmp] 15 bmp Vt Set:  [550 mL] 550 mL PEEP:  [5 cmH20] 5 cmH20   Intake/Output Summary (Last 24 hours) at 11/26/2023 0848 Last data filed at 11/26/2023 4098 Gross per 24 hour  Intake 2650 ml  Output 1450 ml  Net 1200 ml   Filed Weights   11/24/23 0701 11/25/23 0500 11/26/23 0500  Weight: 96.5 kg 100.7 kg 100 kg   Physical Exam: Poorly responsive Downward fixed gaze, pinpoint pupils Rhonci and transmitted upper airway sounds Trach/PEG look okay Some decerebrate posturing to pain, ?occasional LUE movement to command, I did not see it  No labs  Resolved problem list  AKI Hypernatremia  Ileus MRSA PNA sputum 3/24  Assessment & Plan:  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 Margaretmary Eddy office (optho), left VM for eval of persistent L eye chemosis, continue local care for now; dc erythromycin and start lacrilube - Check  CXR, consider sputum culture - Continue TC attempts; lasted ~42 hours last time, secretions (thick tenacious) seem to be issue: start CPT and hypertonic - TF via PEG - Check AM labs - Will be difficult placement, appreciate TOC help  Best Practice (right click and "Reselect all SmartList Selections" daily)   Diet/type: tubefeeds and NPO DVT prophylaxis LMWH Pressure ulcer(s): N/A GI prophylaxis: H2B Lines: N/A Foley:  NA  Code Status:  DNR with pre-arrest interventions   31 min cc time  Dolores Patty Pulmonary and Critical Care Medicine 11/26/2023 8:48 AM  Pager: see AMION  If no response to pager , please call critical care on call (see AMION) until 7pm After 7:00 pm call Elink

## 2023-11-26 NOTE — Progress Notes (Signed)
 RT placed patient on 12L 40% ATC at this time. Patient currently tolerating well. RN aware. RT will continue to monitor.

## 2023-11-27 DIAGNOSIS — J9601 Acute respiratory failure with hypoxia: Secondary | ICD-10-CM | POA: Diagnosis not present

## 2023-11-27 DIAGNOSIS — K567 Ileus, unspecified: Secondary | ICD-10-CM | POA: Diagnosis not present

## 2023-11-27 DIAGNOSIS — J208 Acute bronchitis due to other specified organisms: Secondary | ICD-10-CM

## 2023-11-27 DIAGNOSIS — R1310 Dysphagia, unspecified: Secondary | ICD-10-CM | POA: Diagnosis not present

## 2023-11-27 DIAGNOSIS — E878 Other disorders of electrolyte and fluid balance, not elsewhere classified: Secondary | ICD-10-CM | POA: Diagnosis not present

## 2023-11-27 DIAGNOSIS — J690 Pneumonitis due to inhalation of food and vomit: Secondary | ICD-10-CM | POA: Diagnosis not present

## 2023-11-27 DIAGNOSIS — I619 Nontraumatic intracerebral hemorrhage, unspecified: Secondary | ICD-10-CM | POA: Diagnosis not present

## 2023-11-27 DIAGNOSIS — D75839 Thrombocytosis, unspecified: Secondary | ICD-10-CM | POA: Diagnosis not present

## 2023-11-27 DIAGNOSIS — I613 Nontraumatic intracerebral hemorrhage in brain stem: Secondary | ICD-10-CM | POA: Diagnosis not present

## 2023-11-27 DIAGNOSIS — H11412 Vascular abnormalities of conjunctiva, left eye: Secondary | ICD-10-CM | POA: Diagnosis not present

## 2023-11-27 DIAGNOSIS — J15212 Pneumonia due to Methicillin resistant Staphylococcus aureus: Secondary | ICD-10-CM | POA: Diagnosis not present

## 2023-11-27 DIAGNOSIS — B9562 Methicillin resistant Staphylococcus aureus infection as the cause of diseases classified elsewhere: Secondary | ICD-10-CM | POA: Diagnosis not present

## 2023-11-27 LAB — CBC
HCT: 26.7 % — ABNORMAL LOW (ref 39.0–52.0)
Hemoglobin: 8.1 g/dL — ABNORMAL LOW (ref 13.0–17.0)
MCH: 30.9 pg (ref 26.0–34.0)
MCHC: 30.3 g/dL (ref 30.0–36.0)
MCV: 101.9 fL — ABNORMAL HIGH (ref 80.0–100.0)
Platelets: 495 10*3/uL — ABNORMAL HIGH (ref 150–400)
RBC: 2.62 MIL/uL — ABNORMAL LOW (ref 4.22–5.81)
RDW: 14.5 % (ref 11.5–15.5)
WBC: 11.7 10*3/uL — ABNORMAL HIGH (ref 4.0–10.5)
nRBC: 0 % (ref 0.0–0.2)

## 2023-11-27 LAB — GLUCOSE, CAPILLARY
Glucose-Capillary: 104 mg/dL — ABNORMAL HIGH (ref 70–99)
Glucose-Capillary: 92 mg/dL (ref 70–99)
Glucose-Capillary: 125 mg/dL — ABNORMAL HIGH (ref 70–99)
Glucose-Capillary: 109 mg/dL — ABNORMAL HIGH (ref 70–99)
Glucose-Capillary: 92 mg/dL (ref 70–99)
Glucose-Capillary: 97 mg/dL (ref 70–99)

## 2023-11-27 LAB — MAGNESIUM: Magnesium: 2.3 mg/dL (ref 1.7–2.4)

## 2023-11-27 LAB — BASIC METABOLIC PANEL WITH GFR
Anion gap: 11 (ref 5–15)
BUN: 25 mg/dL — ABNORMAL HIGH (ref 6–20)
CO2: 24 mmol/L (ref 22–32)
Calcium: 9.1 mg/dL (ref 8.9–10.3)
Chloride: 105 mmol/L (ref 98–111)
Creatinine, Ser: 0.77 mg/dL (ref 0.61–1.24)
GFR, Estimated: >60 mL/min (ref >60)
Glucose, Bld: 112 mg/dL — ABNORMAL HIGH (ref 70–99)
Potassium: 3.6 mmol/L (ref 3.5–5.1)
Sodium: 140 mmol/L (ref 135–145)

## 2023-11-27 LAB — PHOSPHORUS: Phosphorus: 3.7 mg/dL (ref 2.5–4.6)

## 2023-11-27 LAB — PROCALCITONIN: Procalcitonin: <0.1 ng/mL

## 2023-11-27 MED ORDER — POLYETHYLENE GLYCOL 3350 17 G PO PACK
17.0000 g | PACK | Freq: Every day | ORAL | Status: DC
  Administered 2023-11-27 – 2023-11-30 (×2): 17 g
  Filled 2023-11-27 (×2): qty 1

## 2023-11-27 MED ORDER — FREE WATER
200.0000 mL | Freq: Two times a day (BID) | Status: DC
  Administered 2023-11-27 – 2023-11-28 (×3): 200 mL

## 2023-11-27 MED ORDER — SCOPOLAMINE 1 MG/3DAYS TD PT72
1.0000 | MEDICATED_PATCH | TRANSDERMAL | Status: DC
  Administered 2023-11-27: 1.5 mg via TRANSDERMAL
  Filled 2023-11-27: qty 1

## 2023-11-27 MED ORDER — REVEFENACIN 175 MCG/3ML IN SOLN
175.0000 ug | Freq: Every day | RESPIRATORY_TRACT | Status: DC
  Administered 2023-11-27 – 2023-12-11 (×15): 175 ug via RESPIRATORY_TRACT
  Filled 2023-11-27 (×15): qty 3

## 2023-11-27 NOTE — Progress Notes (Signed)
 NAME:  Garrett [name], MRN:  [id number], DOB:  05-20-1976, LOS: 31 ADMISSION DATE:  10/27/2023, CONSULTATION DATE:  10/27/2023 REFERRING MD:  Coralee Pesa, DO CHIEF COMPLAINT:  Found Down  History of Present Illness:  48 y/o male with past medical history of hypertension who was found down for unknown downtime by his fiance when she came home from work. Reportedly, having agonal breathing.  He apparently had driven her to work this morning.  He has HTN but never checks his BP and does not follow with MD.  She says you can tell when his BP is really high; his eyes get blood shot and he is sweating.  No h/o seizures.  Besides almost daily [name] and cigarette smoking, she is not aware of any other drugs. Drug screen positive for opioids and he was given several doses of Narcan. Patient found to have:  1. 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 Basal cisterns and fourth ventricle are effaced without hydrocephalus at this time. 2. Trace additional subarachnoid hemorrhage along the left parietal convexity. BP in ED 262/156. His CXR revealed a RUL and perihilar infiltrates suggesting aspiration pneumonia. ED labs also revealed PH 7.169, LA 4.4, CPK 985. Patient was intubated in ED.  Pertinent  Medical History  hypertension  Significant Hospital Events: Including procedures, antibiotic start and stop dates in addition to other pertinent events   3/8: Intubated in ED, pontine bleed/SAH. CT head 17:09 Acute large hemorrhage 4.1 cm in pons with intraventricular extension without hydrocephalus 3/9: CTA 03:14 AM No change in IPH in pons, similar biparietal [facility] 3/14: Now awake, appears locked in can follow commands with eyes. 3/16 Purposeful with left upper extremity  3/22 tracheostomy placed at bedside, bleeding issues overnight from trach site 3/24 PEG (Dr. Janee Morn) 3/24  sputum culture with MRSA 3/27 vomited. TF on hold. Abd film c/w ileus. Added reglan, got SSE. Did tolerate PSV all day  3/28 BMs x2 after SSE day prior. Added back TFs at 1/2 rated. Tolerated PS 3/29 Tolerated PS  3/31 Tolerating CPAP PS 15/5, TF on hold due to ileus. 4/1: con't to hold tube feeds today, restarting vancomycin and sending tracheal aspirate as peaks are 37, plat 24, driving 19. Thick secretions. Fever overnight.  4/2: peaks improved. Ongoing hiccups. Trickle feeding. Neuro exam unchanged.   Interim History / Subjective:  Back on vent for secretion burden and WOB.  Objective   Blood pressure 110/82, pulse 73, temperature 98.5 F (36.9 C), temperature source Axillary, resp. rate 16, height 5\' 8"  (1.727 m), weight 97.9 kg, SpO2 98%.    Vent Mode: PRVC FiO2 (%):  [40 %] 40 % Set Rate:  [15 bmp] 15 bmp Vt Set:  [550 mL] 550 mL PEEP:  [5 cmH20] 5 cmH20 Plateau Pressure:  [11 cmH20-18 cmH20] 18 cmH20   Intake/Output Summary (Last 24 hours) at 11/27/2023 0715 Last data filed at 11/27/2023 0600 Gross per 24 hour  Intake 1700.09 ml  Output 1200 ml  Net 500.09 ml   Filed Weights   11/25/23 0500 11/26/23 0500 11/27/23 0500  Weight: 100.7 kg 100 kg 97.9 kg   Physical Exam: L eye dressed Seems to track a bit with R +BS, abd soft Not moving ext for me Copious mucopurulent secretions  Labs pending again  Resolved problem list   AKI Hypernatremia  Ileus MRSA PNA sputum 3/24  Assessment & Plan:  Large pontine hemorrhage  with IVH and brainstem compression  Acute hypoxic respiratory failure  MRSA bronchitis- treated but ongoing copious secretions Dysphagia Macrocytic anemia  Thrombocytosis  Persistent L eye chemosis Poor Dentition  - Appreciate optho recs: c/w exposure keratosis, local wound care and coverage of L eye - Check sputum culture - CPT hypertonic not helpful; I guess we can try to dry out but may lead to plugging: given lack of progress will try:  yupelri+scop patch - TF via PEG, lighten bowel regimen given ongoing copious stool output - f/u AM labs - Will be difficult placement, appreciate TOC help; will need vent/SNF  Best Practice (right click and "Reselect all SmartList Selections" daily)   Diet/type: tubefeeds and NPO DVT prophylaxis LMWH Pressure ulcer(s): N/A GI prophylaxis: H2B Lines: N/A Foley:  NA  Code Status:  DNR with pre-arrest interventions   32 min cc time  Dolores Patty Pulmonary and Critical Care Medicine 11/27/2023 7:15 AM  Pager: see AMION  If no response to pager , please call critical care on call (see AMION) until 7pm After 7:00 pm call Elink

## 2023-11-27 NOTE — Progress Notes (Signed)
 Nutrition Follow-up  DOCUMENTATION CODES:   Not applicable  INTERVENTION:   Continue tube feeding: Osmolite 1.5 at 50 ml/h (1200 ml per day) Prosource TF20 60 ml TID 200 ml FWF Q12H  Provides 2040 kcal, 135 grams protein, and 912 ml free water (1314 ml water TF + FWF daily)  Continue MVI with minerals daily   NUTRITION DIAGNOSIS:   Inadequate oral intake related to inability to eat as evidenced by NPO status.  - Still applicable   GOAL:   Patient will meet greater than or equal to 90% of their needs  - Meeting via TF  MONITOR:   TF tolerance, I & O's  REASON FOR ASSESSMENT:   Consult Enteral/tube feeding initiation and management  ASSESSMENT:   Pt with PMH of HTN, daily mariajuana, and smoker admitted after being found down at home with pontine hemorrhagic stroke and trace SAH. UDS positive for opioids. Noted aspiration PNA on admission.  3/8 - admitted with [facility], intubated 3/9 - Adult TF protocol started 3/14 - s/p cortrak placement, tip gastric  3/22- tracheostomy at bedside 3/24- PEG placement 3/25 diuresed removed 2.2L, Net -1.8L 3/26- emesis, TF paused 3/28 - trickle TF starated 3/31 - TF off due to ileus 4/2 - trickle TF started  4/4 - TF back at goal   Per RN patient tolerated close to 48 hours on TC, then had to be placed back on full vent support due to increased secretions and WOB. Weaning vent today and hopefully will start TC again soon. Ileus now resolved, Is having copious amount of loose stools per RN. Bowel regimen decreased to Miralax daily. Tolerating TF at goal.   MV: 8.1 L/min Temp (24hrs), Avg:98.3 F (36.8 C), Min:97.9 F (36.6 C), Max:98.8 F (37.1 C)  MAP (Cuff): 96 mmHg  Admit weight: 103 kg   Current weight: 97.9 kg    Intake/Output Summary (Last 24 hours) at 11/27/2023 1338 Last data filed at 11/27/2023 1300 Gross per 24 hour  Intake 2052.37 ml  Output 1150 ml  Net 902.37 ml   Net IO Since Admission: 7,455.86 mL [11/27/23  1338]  Nutritionally Relevant Medications: Scheduled Meds:  amLODipine  10 mg Per Tube Daily   artificial tears   Left Eye Q8H   bethanechol  25 mg Per Tube QID   Chlorhexidine Gluconate Cloth  6 each Topical Daily   enoxaparin (LOVENOX) injection  50 mg Subcutaneous Daily   famotidine  20 mg Per Tube BID   feeding supplement (PROSource TF20)  60 mL Per Tube TID   free water  200 mL Per Tube Q12H   hydrALAZINE  50 mg Per Tube Q8H   insulin aspart  0-15 Units Subcutaneous Q4H   metoprolol tartrate  100 mg Per Tube BID   multivitamin with minerals  1 tablet Per Tube Daily   mouth rinse  15 mL Mouth Rinse Q2H   polyethylene glycol  17 g Per Tube Daily   revefenacin  175 mcg Nebulization Daily   scopolamine  1 patch Transdermal Q72H   Continuous Infusions:  feeding supplement (OSMOLITE 1.5 CAL) 50 mL/hr at 11/27/23 1300   vancomycin 166.7 mL/hr at 11/27/23 1300   Labs Reviewed: BUN 25, HDL 37,  CBG ranges from 92-125 mg/dL over the last 24 hours HgbA1c 4.7  NUTRITION - FOCUSED PHYSICAL EXAM:  Flowsheet Row Most Recent Value  Orbital Region No depletion  Upper Arm Region No depletion  Thoracic and Lumbar Region No depletion  Buccal Region No depletion  Temple Region No depletion  Clavicle Bone Region No depletion  Clavicle and Acromion Bone Region No depletion  Scapular Bone Region No depletion  Dorsal Hand Unable to assess  [Edema]  Patellar Region No depletion  Anterior Thigh Region No depletion  Posterior Calf Region No depletion  Edema (RD Assessment) None  Hair Reviewed  Eyes Reviewed  [Left eye taped]  Mouth Unable to assess  Skin Reviewed  Nails Reviewed  [Pale nails]       Diet Order:   Diet Order             Diet NPO time specified  Diet effective now                   EDUCATION NEEDS:   No education needs have been identified at this time  Skin:  Skin Assessment: Skin Integrity Issues: Skin Integrity Issues:: Stage II Stage II: Stage 2,  perineum  Last BM:  400 ml and 1 unmeasured x 24 hrs  Height:   Ht Readings from Last 1 Encounters:  11/22/23 5\' 8"  (1.727 m)    Weight:   Wt Readings from Last 1 Encounters:  11/27/23 97.9 kg    Ideal Body Weight:  70 kg  BMI:  Body mass index is 32.82 kg/m.  Estimated Nutritional Needs:   Kcal:  2000-2200  Protein:  125-140 grams  Fluid:  >2 L/day   Elliot Dally, RD Registered Dietitian  See Amion for more information

## 2023-11-27 NOTE — Plan of Care (Signed)
  Problem: Education: Goal: Knowledge of disease or condition will improve Outcome: Not Progressing Goal: Knowledge of secondary prevention will improve (MUST DOCUMENT ALL) Outcome: Not Progressing   Problem: Intracerebral Hemorrhage Tissue Perfusion: Goal: Complications of Intracerebral Hemorrhage will be minimized Outcome: Not Progressing   Problem: Coping: Goal: Will verbalize positive feelings about self Outcome: Not Progressing Goal: Will identify appropriate support needs Outcome: Not Progressing   Problem: Health Behavior/Discharge Planning: Goal: Ability to manage health-related needs will improve Outcome: Not Progressing Goal: Goals will be collaboratively established with patient/family Outcome: Not Progressing   Problem: Self-Care: Goal: Ability to participate in self-care as condition permits will improve Outcome: Not Progressing Goal: Verbalization of feelings and concerns over difficulty with self-care will improve Outcome: Not Progressing Goal: Ability to communicate needs accurately will improve Outcome: Not Progressing   Problem: Nutrition: Goal: Risk of aspiration will decrease Outcome: Progressing Goal: Dietary intake will improve Outcome: Progressing   Problem: Fluid Volume: Goal: Ability to maintain a balanced intake and output will improve Outcome: Progressing   Problem: Skin Integrity: Goal: Risk for impaired skin integrity will decrease Outcome: Not Progressing   Problem: Tissue Perfusion: Goal: Adequacy of tissue perfusion will improve Outcome: Progressing   Problem: Clinical Measurements: Goal: Ability to maintain clinical measurements within normal limits will improve Outcome: Progressing Goal: Will remain free from infection Outcome: Not Progressing Goal: Diagnostic test results will improve Outcome: Progressing Goal: Respiratory complications will improve Outcome: Not Progressing Goal: Cardiovascular complication will be  avoided Outcome: Progressing   Problem: Education: Goal: Knowledge of General Education information will improve Description: Including pain rating scale, medication(s)/side effects and non-pharmacologic comfort measures Outcome: Not Progressing

## 2023-11-27 NOTE — Progress Notes (Signed)
 RT NOTE: 1600 CPT held at this time due to patient having vomiting episodes.  Tolerating current vent settings well at this time.  Will continue to monitor.

## 2023-11-28 DIAGNOSIS — J15212 Pneumonia due to Methicillin resistant Staphylococcus aureus: Secondary | ICD-10-CM | POA: Diagnosis not present

## 2023-11-28 DIAGNOSIS — J208 Acute bronchitis due to other specified organisms: Secondary | ICD-10-CM | POA: Diagnosis not present

## 2023-11-28 DIAGNOSIS — D75839 Thrombocytosis, unspecified: Secondary | ICD-10-CM | POA: Diagnosis not present

## 2023-11-28 DIAGNOSIS — E878 Other disorders of electrolyte and fluid balance, not elsewhere classified: Secondary | ICD-10-CM | POA: Diagnosis not present

## 2023-11-28 DIAGNOSIS — I619 Nontraumatic intracerebral hemorrhage, unspecified: Secondary | ICD-10-CM | POA: Diagnosis not present

## 2023-11-28 DIAGNOSIS — K567 Ileus, unspecified: Secondary | ICD-10-CM | POA: Diagnosis not present

## 2023-11-28 DIAGNOSIS — J9601 Acute respiratory failure with hypoxia: Secondary | ICD-10-CM | POA: Diagnosis not present

## 2023-11-28 DIAGNOSIS — R1310 Dysphagia, unspecified: Secondary | ICD-10-CM | POA: Diagnosis not present

## 2023-11-28 DIAGNOSIS — J690 Pneumonitis due to inhalation of food and vomit: Secondary | ICD-10-CM | POA: Diagnosis not present

## 2023-11-28 DIAGNOSIS — H11412 Vascular abnormalities of conjunctiva, left eye: Secondary | ICD-10-CM | POA: Diagnosis not present

## 2023-11-28 DIAGNOSIS — B9562 Methicillin resistant Staphylococcus aureus infection as the cause of diseases classified elsewhere: Secondary | ICD-10-CM | POA: Diagnosis not present

## 2023-11-28 DIAGNOSIS — I613 Nontraumatic intracerebral hemorrhage in brain stem: Secondary | ICD-10-CM | POA: Diagnosis not present

## 2023-11-28 LAB — CBC
HCT: 28 % — ABNORMAL LOW (ref 39.0–52.0)
Hemoglobin: 8.7 g/dL — ABNORMAL LOW (ref 13.0–17.0)
MCH: 31 pg (ref 26.0–34.0)
MCHC: 31.1 g/dL (ref 30.0–36.0)
MCV: 99.6 fL (ref 80.0–100.0)
Platelets: 461 10*3/uL — ABNORMAL HIGH (ref 150–400)
RBC: 2.81 MIL/uL — ABNORMAL LOW (ref 4.22–5.81)
RDW: 14.6 % (ref 11.5–15.5)
WBC: 11 10*3/uL — ABNORMAL HIGH (ref 4.0–10.5)
nRBC: 0 % (ref 0.0–0.2)

## 2023-11-28 LAB — BASIC METABOLIC PANEL WITH GFR
Anion gap: 11 (ref 5–15)
BUN: 21 mg/dL — ABNORMAL HIGH (ref 6–20)
CO2: 23 mmol/L (ref 22–32)
Calcium: 9.2 mg/dL (ref 8.9–10.3)
Chloride: 106 mmol/L (ref 98–111)
Creatinine, Ser: 0.84 mg/dL (ref 0.61–1.24)
GFR, Estimated: >60 mL/min (ref >60)
Glucose, Bld: 105 mg/dL — ABNORMAL HIGH (ref 70–99)
Potassium: 3.4 mmol/L — ABNORMAL LOW (ref 3.5–5.1)
Sodium: 140 mmol/L (ref 135–145)

## 2023-11-28 LAB — GLUCOSE, CAPILLARY
Glucose-Capillary: 107 mg/dL — ABNORMAL HIGH (ref 70–99)
Glucose-Capillary: 111 mg/dL — ABNORMAL HIGH (ref 70–99)
Glucose-Capillary: 122 mg/dL — ABNORMAL HIGH (ref 70–99)
Glucose-Capillary: 113 mg/dL — ABNORMAL HIGH (ref 70–99)
Glucose-Capillary: 121 mg/dL — ABNORMAL HIGH (ref 70–99)
Glucose-Capillary: 115 mg/dL — ABNORMAL HIGH (ref 70–99)

## 2023-11-28 LAB — MAGNESIUM: Magnesium: 2 mg/dL (ref 1.7–2.4)

## 2023-11-28 LAB — PHOSPHORUS: Phosphorus: 3.5 mg/dL (ref 2.5–4.6)

## 2023-11-28 MED ORDER — POTASSIUM CHLORIDE 20 MEQ PO PACK
40.0000 meq | PACK | Freq: Once | ORAL | Status: AC
  Administered 2023-11-28: 40 meq
  Filled 2023-11-28: qty 2

## 2023-11-28 MED ORDER — LINEZOLID 600 MG/300ML IV SOLN
600.0000 mg | Freq: Two times a day (BID) | INTRAVENOUS | Status: DC
  Administered 2023-11-28 – 2023-11-29 (×3): 600 mg via INTRAVENOUS
  Filled 2023-11-28 (×3): qty 300

## 2023-11-28 MED ORDER — METOCLOPRAMIDE HCL 5 MG/ML IJ SOLN
10.0000 mg | Freq: Two times a day (BID) | INTRAMUSCULAR | Status: AC
  Administered 2023-11-28 (×2): 10 mg via INTRAVENOUS
  Filled 2023-11-28 (×2): qty 2

## 2023-11-28 NOTE — Progress Notes (Signed)
 NAME:  Garrett [name], MRN:  [id number], DOB:  10-Jul-1976, LOS: 32 ADMISSION DATE:  10/27/2023, CONSULTATION DATE:  10/27/2023 REFERRING MD:  Coralee Pesa, DO CHIEF COMPLAINT:  Found Down  History of Present Illness:  48 y/o male with past medical history of hypertension who was found down for unknown downtime by his fiance when she came home from work. Reportedly, having agonal breathing.  He apparently had driven her to work this morning.  He has HTN but never checks his BP and does not follow with MD.  She says you can tell when his BP is really high; his eyes get blood shot and he is sweating.  No h/o seizures.  Besides almost daily [name] and cigarette smoking, she is not aware of any other drugs. Drug screen positive for opioids and he was given several doses of Narcan. Patient found to have:  1. 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 Basal cisterns and fourth ventricle are effaced without hydrocephalus at this time. 2. Trace additional subarachnoid hemorrhage along the left parietal convexity. BP in ED 262/156. His CXR revealed a RUL and perihilar infiltrates suggesting aspiration pneumonia. ED labs also revealed PH 7.169, LA 4.4, CPK 985. Patient was intubated in ED.  Pertinent  Medical History  hypertension  Significant Hospital Events: Including procedures, antibiotic start and stop dates in addition to other pertinent events   3/8: Intubated in ED, pontine bleed/SAH. CT head 17:09 Acute large hemorrhage 4.1 cm in pons with intraventricular extension without hydrocephalus 3/9: CTA 03:14 AM No change in IPH in pons, similar biparietal [facility] 3/14: Now awake, appears locked in can follow commands with eyes. 3/16 Purposeful with left upper extremity  3/22 tracheostomy placed at bedside, bleeding issues overnight from trach site 3/24 PEG (Dr. Janee Morn) 3/24  sputum culture with MRSA 3/27 vomited. TF on hold. Abd film c/w ileus. Added reglan, got SSE. Did tolerate PSV all day  3/28 BMs x2 after SSE day prior. Added back TFs at 1/2 rated. Tolerated PS 3/29 Tolerated PS  3/31 Tolerating CPAP PS 15/5, TF on hold due to ileus. 4/1: con't to hold tube feeds today, restarting vancomycin and sending tracheal aspirate as peaks are 37, plat 24, driving 19. Thick secretions. Fever overnight.  4/2: peaks improved. Ongoing hiccups. Trickle feeding. Neuro exam unchanged.   Interim History / Subjective:  Continues to have heavy secretion burden, sputum growing staph.  Objective   Blood pressure 116/81, pulse 86, temperature 99.2 F (37.3 C), temperature source Axillary, resp. rate (!) 26, height 5\' 8"  (1.727 m), weight 101 kg, SpO2 100%.    Vent Mode: CPAP;PSV FiO2 (%):  [40 %] 40 % Set Rate:  [15 bmp] 15 bmp Vt Set:  [550 mL] 550 mL PEEP:  [5 cmH20] 5 cmH20 Pressure Support:  [10 cmH20] 10 cmH20 Plateau Pressure:  [16 cmH20-18 cmH20] 16 cmH20   Intake/Output Summary (Last 24 hours) at 11/28/2023 1125 Last data filed at 11/28/2023 0900 Gross per 24 hour  Intake 709.89 ml  Output 900 ml  Net -190.11 ml   Filed Weights   11/26/23 0500 11/27/23 0500 11/28/23 0500  Weight: 100 kg 97.9 kg 101 kg   Physical Exam: Opens R eye and tracks occasionally L eye taped shut per optho Some LUE movement ?extensor posturing Continued heavy secretions and rhonci Triggers vent Not much movement anywhere else +BS, rectal tube in place  K repleted  Resolved problem list   AKI Hypernatremia  Ileus MRSA PNA sputum 3/24  Assessment & Plan:  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  Acute hypoxic respiratory failure  MRSA bronchitis- treated but ongoing copious secretions Dysphagia Macrocytic anemia  Thrombocytosis  Persistent L eye chemosis Poor Dentition  - Appreciate optho recs: c/w exposure keratosis, local wound care and  coverage of L eye - Change vanc to linezolid for persistent MRSA bronchitis/PNA - DC scopalamine as this is ongoing infection driving secretion burdeen - Giving some reglan and increasing TF, vomiting 4/8 thought related to secretions more than feeding - Will be difficult placement, appreciate TOC help; will need vent/SNF  Best Practice (right click and "Reselect all SmartList Selections" daily)   Diet/type: tubefeeds and NPO DVT prophylaxis LMWH Pressure ulcer(s): N/A GI prophylaxis: H2B Lines: N/A Foley:  NA  Code Status:  DNR with pre-arrest interventions   31 min cc time  Dolores Patty Pulmonary and Critical Care Medicine 11/28/2023 11:25 AM  Pager: see AMION  If no response to pager , please call critical care on call (see AMION) until 7pm After 7:00 pm call Elink

## 2023-11-28 NOTE — TOC Progression Note (Signed)
 Transition of Care [facility]) - Progression Note    Patient Details  Name: Reedy Biernat MRN: [id number] Date of Birth: 1976/02/06  Transition of Care [facility] CM/SW Contact  Mearl Latin, LCSW Phone Number: 11/28/2023, 2:43 PM  Clinical Narrative:    CSW spoke with patient's mother to make her aware that [facility] has a wait list and will need to proceed with [facility] facilities in [place]. She stated she thought patient had been able to come off of the vent for a little while. CSW explained that patient is still requiring the vent at this point so a Vent SNF is the level of care he will require. She reported understanding. CSW notified Tammy with [facility] and she will have Jeni contact mom to begin the [place] process.   Expected Discharge Plan: Long Term Nursing Home Barriers to Discharge: Continued Medical Work up, SNF Pending Medicaid, SNF Pending bed offer, SNF Pending payor source - LOG, Inadequate or no insurance (New trach)  Expected Discharge Plan and Services In-house Referral: Clinical Social Work, Hospice / Palliative Care   Post Acute Care Choice: Skilled Nursing Facility Living arrangements for the past 2 months: Single Family Home                                       Social Determinants of Health (SDOH) Interventions SDOH Screenings   Food Insecurity: Patient Unable To Answer (10/29/2023)  Social Connections: Unknown (06/23/2023)   Received from [facility]  Tobacco Use: High Risk (10/31/2023)    Readmission Risk Interventions     No data to display

## 2023-11-29 DIAGNOSIS — J9601 Acute respiratory failure with hypoxia: Secondary | ICD-10-CM | POA: Diagnosis not present

## 2023-11-29 DIAGNOSIS — D75839 Thrombocytosis, unspecified: Secondary | ICD-10-CM | POA: Diagnosis not present

## 2023-11-29 DIAGNOSIS — J690 Pneumonitis due to inhalation of food and vomit: Secondary | ICD-10-CM | POA: Diagnosis not present

## 2023-11-29 DIAGNOSIS — I10 Essential (primary) hypertension: Secondary | ICD-10-CM | POA: Diagnosis not present

## 2023-11-29 DIAGNOSIS — R1310 Dysphagia, unspecified: Secondary | ICD-10-CM | POA: Diagnosis not present

## 2023-11-29 DIAGNOSIS — J15212 Pneumonia due to Methicillin resistant Staphylococcus aureus: Secondary | ICD-10-CM | POA: Diagnosis not present

## 2023-11-29 DIAGNOSIS — I613 Nontraumatic intracerebral hemorrhage in brain stem: Secondary | ICD-10-CM | POA: Diagnosis not present

## 2023-11-29 DIAGNOSIS — Z930 Tracheostomy status: Secondary | ICD-10-CM | POA: Diagnosis not present

## 2023-11-29 DIAGNOSIS — E876 Hypokalemia: Secondary | ICD-10-CM | POA: Diagnosis not present

## 2023-11-29 DIAGNOSIS — D539 Nutritional anemia, unspecified: Secondary | ICD-10-CM | POA: Diagnosis not present

## 2023-11-29 DIAGNOSIS — I619 Nontraumatic intracerebral hemorrhage, unspecified: Secondary | ICD-10-CM | POA: Diagnosis not present

## 2023-11-29 DIAGNOSIS — K567 Ileus, unspecified: Secondary | ICD-10-CM | POA: Diagnosis not present

## 2023-11-29 LAB — GLUCOSE, CAPILLARY
Glucose-Capillary: 105 mg/dL — ABNORMAL HIGH (ref 70–99)
Glucose-Capillary: 113 mg/dL — ABNORMAL HIGH (ref 70–99)
Glucose-Capillary: 116 mg/dL — ABNORMAL HIGH (ref 70–99)
Glucose-Capillary: 138 mg/dL — ABNORMAL HIGH (ref 70–99)
Glucose-Capillary: 92 mg/dL (ref 70–99)
Glucose-Capillary: 94 mg/dL (ref 70–99)

## 2023-11-29 LAB — CBC
HCT: 26.9 % — ABNORMAL LOW (ref 39.0–52.0)
Hemoglobin: 8.3 g/dL — ABNORMAL LOW (ref 13.0–17.0)
MCH: 31.1 pg (ref 26.0–34.0)
MCHC: 30.9 g/dL (ref 30.0–36.0)
MCV: 100.7 fL — ABNORMAL HIGH (ref 80.0–100.0)
Platelets: 419 10*3/uL — ABNORMAL HIGH (ref 150–400)
RBC: 2.67 MIL/uL — ABNORMAL LOW (ref 4.22–5.81)
RDW: 14.5 % (ref 11.5–15.5)
WBC: 9.3 10*3/uL (ref 4.0–10.5)
nRBC: 0 % (ref 0.0–0.2)

## 2023-11-29 LAB — BASIC METABOLIC PANEL WITH GFR
Anion gap: 12 (ref 5–15)
BUN: 20 mg/dL (ref 6–20)
CO2: 23 mmol/L (ref 22–32)
Calcium: 9 mg/dL (ref 8.9–10.3)
Chloride: 103 mmol/L (ref 98–111)
Creatinine, Ser: 0.84 mg/dL (ref 0.61–1.24)
GFR, Estimated: >60 mL/min (ref >60)
Glucose, Bld: 95 mg/dL (ref 70–99)
Potassium: 3.8 mmol/L (ref 3.5–5.1)
Sodium: 138 mmol/L (ref 135–145)

## 2023-11-29 LAB — CULTURE, RESPIRATORY W GRAM STAIN

## 2023-11-29 LAB — MAGNESIUM: Magnesium: 2.2 mg/dL (ref 1.7–2.4)

## 2023-11-29 LAB — PHOSPHORUS: Phosphorus: 4 mg/dL (ref 2.5–4.6)

## 2023-11-29 MED ORDER — POTASSIUM CHLORIDE 20 MEQ PO PACK
40.0000 meq | PACK | Freq: Once | ORAL | Status: AC
  Administered 2023-11-29: 40 meq
  Filled 2023-11-29: qty 2

## 2023-11-29 MED ORDER — FREE WATER
100.0000 mL | Freq: Four times a day (QID) | Status: DC
  Administered 2023-11-29 – 2023-12-15 (×64): 100 mL

## 2023-11-29 MED ORDER — LINEZOLID 600 MG PO TABS
600.0000 mg | ORAL_TABLET | Freq: Two times a day (BID) | ORAL | Status: AC
  Administered 2023-11-29 – 2023-12-04 (×11): 600 mg
  Filled 2023-11-29 (×11): qty 1

## 2023-11-29 NOTE — Progress Notes (Signed)
 SLP Cancellation Note  Patient Details Name: Garrett [name] MRN: [id number] DOB: 1975/08/23   Cancelled treatment:       Reason Eval/Treat Not Completed: Patient not medically ready Patient still with a lot of secretions on #8 cuffed trach. Spoke with MD (Dr. Katrinka Blazing) who advised to s/o at this time. Please reorder when patient able to be downsized to #6 trach. Thank you for this consult!   Angela Nevin, MA, CCC-SLP Speech Therapy

## 2023-11-29 NOTE — Progress Notes (Signed)
 Occupational Therapy Treatment Patient Details Name: Garrett [name] MRN: [id number] DOB: Jan 06, 1976 Today's Date: 11/29/2023   History of present illness Pt is a 48 yo male who was found down 10/27/23 with agonal breathing. 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 Intubated 3/8, cortrak placed 3/14, trach placed 3/22, PEG placed 3/24. PMH: HTN, smoker   OT comments  Patient supine in bed, awake and fiancee at side. He is follow a few simple 1 step commands today with increased time, but remains inconsistent.  Total assist +2 to roll in bed and place sling, maxi sky to recliner. Decreased RR on TC when upright in chair. Max hand over hand assist to L in order to wipe mouth.  Overall total assist +2 for ADLs. Will follow acutely.       If plan is discharge home, recommend the following:  Two people to help with walking and/or transfers;Two people to help with bathing/dressing/bathroom;Other (comment) (total care)   Equipment Recommendations  Wheelchair cushion (measurements OT);Hospital bed;Wheelchair (measurements OT);Hoyer lift    Recommendations for Other Services      Precautions / Restrictions Precautions Precautions: Fall;Other (comment) Recall of Precautions/Restrictions: Impaired Precaution/Restrictions Comments: TC 50% FiO2 12L, PEG, abdominal binder, SBP < 160 Restrictions Weight Bearing Restrictions Per Provider Order: No       Mobility Bed Mobility Overal bed mobility: Needs Assistance Bed Mobility: Rolling Rolling: Total assist, +2 for physical assistance, +2 for safety/equipment         General bed mobility comments: total assist +2 to roll and place sling. maxi sky to recliner    Transfers Overall transfer level: Needs assistance   Transfers: Bed to chair/wheelchair/BSC              General transfer comment: total assist +2 Transfer via Lift Equipment: Maxisky   Balance Overall balance assessment: Needs assistance Sitting-balance support: Feet supported, Bilateral upper extremity supported (supported in recliner) Sitting balance-Leahy Scale: Fair Sitting balance - Comments: R lateral lean, supported with pillow to maintain midline posture Postural control: Right lateral lean                                 ADL either performed or assessed with clinical judgement   ADL Overall ADL's : Needs assistance/impaired     Grooming: Maximal assistance;Sitting Grooming Details (indicate cue type and reason): hand over hand using L hand to wipe mouth                               General ADL Comments: total care    Extremity/Trunk Assessment Upper Extremity Assessment Upper Extremity Assessment: RUE deficits/detail;LUE deficits/detail RUE Deficits / Details: no active movement noted during sesssion, tends to extend and internally rotate UE when yawning.  significant other present and reports pt is wearing resting hand splint on schedule. RUE Sensation: decreased light touch;decreased proprioception RUE Coordination: decreased fine motor;decreased gross motor LUE Deficits / Details: pt actively moving L UE to command with increased time, significant other reports pt moving UE throughout day when awake and purposefully (looking at time on watch).  appears weak and has difficulty fully reaching to give "high 5" LUE Sensation: decreased light touch;decreased proprioception LUE Coordination: decreased fine motor;decreased gross motor   Lower Extremity  Assessment Lower Extremity Assessment: Defer to PT evaluation        Vision   Vision Assessment?: Vision impaired- to be further tested in functional context Additional Comments: L eye taped shut, R eye open but not scanning or turning head.  Support to turn head to R, L head turn preference.    Perception     Praxis     Communication Communication Communication: Impaired Factors Affecting Communication: Trach/intubated   Cognition Arousal: Alert Behavior During Therapy: Flat affect Cognition: Difficult to assess Difficult to assess due to: Tracheostomy           OT - Cognition Comments: pt with R eye open during session, following a few commands during session with increased time.  Pt able to wiggle toes and lift L legs (after demonstration), show thumbs up and attempt "hive 5" on L UE.                 Following commands: Impaired Following commands impaired: Follows one step commands inconsistently      Cueing   Cueing Techniques: Verbal cues, Tactile cues  Exercises Exercises: Other exercises Other Exercises Other Exercises: R UE PROM from shoulder to hand x 5 reps    Shoulder Instructions       General Comments pt in recliner, significant other at side.  VSS during session with decreased RR once upright in chair.    Pertinent Vitals/ Pain       Pain Assessment Pain Assessment: CPOT Facial Expression: Relaxed, neutral Body Movements: Absence of movements Muscle Tension: Relaxed Compliance with ventilator (intubated pts.): N/A Vocalization (extubated pts.): N/A CPOT Total: 0 Pain Intervention(s): Monitored during session  Home Living                                          Prior Functioning/Environment              Frequency  Min 2X/week        Progress Toward Goals  OT Goals(current goals can now be found in the care plan section)  Progress towards OT goals: Progressing toward goals (slowly)  Acute Rehab OT Goals Patient Stated Goal: unable OT Goal Formulation: Patient unable to participate in goal setting Time For Goal Achievement: 12/13/23 Potential to Achieve Goals: Fair ADL Goals Pt/caregiver will Perform Home Exercise Program: Increased ROM;Both right and left upper extremity Additional ADL Goal  #1: Pt will follow 1 step commands with 50% accuracy in a nondistracting enviornment. Additional ADL Goal #2: Pt will complete bed mobility with max assist +2, maintain sitting balance statically for 3 minutes with mod assist. Additional ADL Goal #3: Pt will tolerate Bil resting hand splints on 2 hours/off schedule.  Plan      Co-evaluation    PT/OT/SLP Co-Evaluation/Treatment: Yes Reason for Co-Treatment: To address functional/ADL transfers;For patient/therapist safety;Necessary to address cognition/behavior during functional activity;Complexity of the patient's impairments (multi-system involvement)   OT goals addressed during session: ADL's and self-care      AM-PAC OT "6 Clicks" Daily Activity     Outcome Measure   Help from another person eating meals?: Total Help from another person taking care of personal grooming?: Total Help from another person toileting, which includes using toliet, bedpan, or urinal?: Total Help from another person bathing (including washing, rinsing, drying)?: Total Help from another person to put on and taking off regular upper body clothing?: Total  Help from another person to put on and taking off regular lower body clothing?: Total 6 Click Score: 6    End of Session Equipment Utilized During Treatment: Oxygen (via TC)  OT Visit Diagnosis: Other abnormalities of gait and mobility (R26.89);Muscle weakness (generalized) (M62.81);Other symptoms and signs involving the nervous system (R29.898);Other symptoms and signs involving cognitive function;Hemiplegia and hemiparesis Hemiplegia - caused by: Nontraumatic SAH   Activity Tolerance Patient tolerated treatment well   Patient Left with call bell/phone within reach;in chair;with chair alarm set;with family/visitor present   Nurse Communication Mobility status;Need for lift equipment        Time: 1191-4782 OT Time Calculation (min): 32 min  Charges: OT General Charges $OT Visit: 1 Visit OT  Treatments $Self Care/Home Management : 8-22 mins  Barry Brunner, OT Acute Rehabilitation Services Office [phone] Secure Chat Preferred    [name] 11/29/2023, 10:57 AM

## 2023-11-29 NOTE — Progress Notes (Signed)
 Nutrition Follow-up  DOCUMENTATION CODES:   Not applicable  INTERVENTION:   Continue tube feeds via PEG: - Osmolite 1.5 currently infusing at 30 mL/hr. Advance rate by 10 mL/hr every 6 hours to goal rate of 50 mL/hr (1200 mL/day) - PROSource TF20 60 mL TID - Adjust free water to 100 mL every 4 hours to decrease flush volume  Tube feeding regimen at goal rate provides 2040 kcal, 135 grams of protein, and 914 ml of H2O.  Total free water with flushes: 1314 mL  - Continue MVI with minerals daily per tube  NUTRITION DIAGNOSIS:   Inadequate oral intake related to inability to eat as evidenced by NPO status.  Ongoing, being addressed via enteral nutrition  GOAL:   Patient will meet greater than or equal to 90% of their needs  Progressing with advancement of tube feeding regimen back to goal  MONITOR:   TF tolerance, I & O's  REASON FOR ASSESSMENT:   Consult Enteral/tube feeding initiation and management  ASSESSMENT:   Pt with PMH of HTN, daily mariajuana, and smoker admitted after being found down at home with pontine hemorrhagic stroke and trace SAH. UDS positive for opioids. Noted aspiration PNA on admission.  3/08 - admitted with [facility], intubated 3/09 - adult TF protocol started 3/14 - s/p cortrak placement, tip gastric  3/22 - s/p tracheostomy at bedside 3/24 - s/p PEG placement 3/25 - diuresed, removed 2.2 L, net -1.8 L 3/26 - emesis, TF paused 3/28 - trickle TF starated 3/31 - TF off due to ileus 4/02 - trickle TF started  4/04 - TF back at goal 4/08 - episode of vomiting x 2, tube feeds paused then resumed at trickle  Pt with 2 episodes of emesis on 11/27/23 thought to be related more to secretions than feeds. Feeds paused on 11/27/23 then resumed at trickle rate. Reglan given yesterday. Tube feeds remain below goal rate today and are infusing via G-tube. Discussed with RN who reports tube feeding rate increased to 30 mL/hr this morning. Per discussion with MD,  okay to advance slowly back to goal rate. Will adjust orders appropriately.  Pt remains on vent support via trach. Per PCCM, okay to try TC today.  Noted pt with free water flush volumes of 200 mL every 12 hours. Will adjust to 100 mL every 6 hours to decrease flush volume.  Pt continues with mild pitting generalized edema.  Current TF: Osmolite 1.5 @ 20 mL/hr, PROSource TF20 60 mL TID, free water flushes 200 mL every 12 hours  Admit weight: 103 kg Current weight: 100.3 kg  Patient is currently on ventilator support via trach Temp (24hrs), Avg:98.4 F (36.9 C), Min:98 F (36.7 C), Max:99.1 F (37.3 C) BP (cuff): 405/74 MAP (cuff): 85  Medications reviewed and include: famotidine 20 mg BID, SSI every 4 hours, MVI with minerals daily, miralax 17 grams daily, IV abx  Labs reviewed: hemoglobin 8.3 CBG's: 92-122 x 24 hours  UOP: 925 mL x 24 hours I/O's: +4.5 L since admit  Diet Order:   Diet Order             Diet NPO time specified  Diet effective now                   EDUCATION NEEDS:   No education needs have been identified at this time  Skin:  Skin Assessment: Skin Integrity Issues: Stage II: perineum   Last BM:  11/28/23 type 7  Height:   Ht Readings  from Last 1 Encounters:  11/22/23 5\' 8"  (1.727 m)    Weight:   Wt Readings from Last 1 Encounters:  11/29/23 100.3 kg    Ideal Body Weight:  70 kg  BMI:  Body mass index is 33.62 kg/m.  Estimated Nutritional Needs:   Kcal:  2000-2200  Protein:  125-140 grams  Fluid:  >2 L/day    Mertie Clause, MS, RD, LDN Registered Dietitian II Please see AMiON for contact information.

## 2023-11-29 NOTE — Progress Notes (Signed)
 NAME:  Garrett [name], MRN:  [id number], DOB:  June 09, 1976, LOS: 33 ADMISSION DATE:  10/27/2023, CONSULTATION DATE:  10/27/2023 REFERRING MD:  Coralee Pesa, DO CHIEF COMPLAINT:  Found Down  History of Present Illness:  48 y/o male with past medical history of hypertension who was found down for unknown downtime by his fiance when she came home from work. Reportedly, having agonal breathing.  He apparently had driven her to work this morning.  He has HTN but never checks his BP and does not follow with MD.  She says you can tell when his BP is really high; his eyes get blood shot and he is sweating.  No h/o seizures.  Besides almost daily [name] and cigarette smoking, she is not aware of any other drugs. Drug screen positive for opioids and he was given several doses of Narcan. Patient found to have:  1. 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 Basal cisterns and fourth ventricle are effaced without hydrocephalus at this time. 2. Trace additional subarachnoid hemorrhage along the left parietal convexity. BP in ED 262/156. His CXR revealed a RUL and perihilar infiltrates suggesting aspiration pneumonia. ED labs also revealed PH 7.169, LA 4.4, CPK 985. Patient was intubated in ED.  Pertinent  Medical History  hypertension  Significant Hospital Events: Including procedures, antibiotic start and stop dates in addition to other pertinent events   3/8: Intubated in ED, pontine bleed/SAH. CT head 17:09 Acute large hemorrhage 4.1 cm in pons with intraventricular extension without hydrocephalus 3/9: CTA 03:14 AM No change in IPH in pons, similar biparietal [facility] 3/14: Now awake, appears locked in can follow commands with eyes. 3/16 Purposeful with left upper extremity  3/22 tracheostomy placed at bedside, bleeding issues overnight from trach site 3/24 PEG (Dr. Janee Morn) 3/24  sputum culture with MRSA 3/27 vomited. TF on hold. Abd film c/w ileus. Added reglan, got SSE. Did tolerate PSV all day  3/28 BMs x2 after SSE day prior. Added back TFs at 1/2 rated. Tolerated PS 3/29 Tolerated PS  3/31 Tolerating CPAP PS 15/5, TF on hold due to ileus. 4/1: con't to hold tube feeds today, restarting vancomycin and sending tracheal aspirate as peaks are 37, plat 24, driving 19. Thick secretions. Fever overnight.  4/2: peaks improved. Ongoing hiccups. Trickle feeding. Neuro exam unchanged.   Interim History / Subjective:  No events, watching an ipad but I can't get him to follow commands. Remains on vent. Secretion burden maybe slightly better?  Objective   Blood pressure 105/74, pulse 79, temperature 99.1 F (37.3 C), temperature source Axillary, resp. rate 18, height 5\' 8"  (1.727 m), weight 100.3 kg, SpO2 100%.    Vent Mode: PRVC FiO2 (%):  [40 %] 40 % Set Rate:  [15 bmp] 15 bmp Vt Set:  [550 mL] 550 mL PEEP:  [5 cmH20] 5 cmH20 Pressure Support:  [10 cmH20] 10 cmH20 Plateau Pressure:  [17 cmH20-18 cmH20] 18 cmH20   Intake/Output Summary (Last 24 hours) at 11/29/2023 0830 Last data filed at 11/29/2023 0800 Gross per 24 hour  Intake 1530 ml  Output 925 ml  Net 605 ml   Filed Weights   11/27/23 0500 11/28/23 0500 11/29/23 0500  Weight: 97.9 kg 101 kg 100.3 kg   Physical Exam: R eye open, occasional tracking but tough to tell L eye taped Some movement on L either posturing or less likely purposeful No movement in other places  Triggers vent Small thick secretions Trace edema Abd soft, +BS, rectal tube in place  K replaced Plts trending down which is good sign  Resolved problem list   AKI Hypernatremia  Ileus MRSA PNA sputum 3/24  Assessment & Plan:  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  Acute hypoxic respiratory failure  MRSA bronchitis- treated but ongoing copious secretions Dysphagia Macrocytic anemia  Thrombocytosis   Persistent L eye chemosis Poor Dentition TF intolerance- seems better, +BS  - Appreciate optho recs: c/w exposure keratosis, local wound care and coverage of L eye; give time - linezolid for persistent MRSA bronchitis/PNA; tenetative plan 7 days (end 4/16) - TF to goal - Okay to try TC today - Will be difficult placement, appreciate TOC help; will need vent/SNF  Best Practice (right click and "Reselect all SmartList Selections" daily)   Diet/type: tubefeeds and NPO DVT prophylaxis LMWH Pressure ulcer(s): N/A GI prophylaxis: H2B Lines: N/A Foley:  NA  Code Status:  DNR with pre-arrest interventions   32 min cc time  Dolores Patty Pulmonary and Critical Care Medicine 11/29/2023 8:30 AM  Pager: see AMION  If no response to pager , please call critical care on call (see AMION) until 7pm After 7:00 pm call Elink

## 2023-11-29 NOTE — Progress Notes (Signed)
 Physical Therapy Treatment Patient Details Name: Garrett [name] MRN: [id number] DOB: 1975-12-10 Today's Date: 11/29/2023   History of Present Illness Pt is a 48 yo male who was found down 10/27/23 with agonal breathing. 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 Intubated 3/8, cortrak placed 3/14, trach placed 3/22, PEG placed 3/24. PMH: HTN, smoker    PT Comments  Pt alert and able to follow more left sided commands today. Requiring two person total assist for bed mobility to don maxi sky lift pad and utilized lift to transition from bed to chair to promote upright and improve respiratory status. Pt tolerated well; BP 117/83 (94), SpO2 94% on trach collar, HR 80, RR 28. Will continue to follow acutely.     If plan is discharge home, recommend the following: Two people to help with walking and/or transfers;Two people to help with bathing/dressing/bathroom;Assistance with cooking/housework;Assistance with feeding;Direct supervision/assist for medications management;Direct supervision/assist for financial management;Assist for transportation;Help with stairs or ramp for entrance;Supervision due to cognitive status   Can travel by private vehicle        Equipment Recommendations  [place] lift;Hospital bed;Wheelchair (measurements PT);Wheelchair cushion (measurements PT);BSC/3in1    Recommendations for Other Services       Precautions / Restrictions Precautions Precautions: Fall;Other (comment) Recall of Precautions/Restrictions: Impaired Precaution/Restrictions Comments: TC 50% FiO2 12L, PEG, abdominal binder, SBP < 160 Restrictions Weight Bearing Restrictions Per Provider Order: No     Mobility  Bed Mobility Overal bed mobility: Needs Assistance Bed Mobility: Rolling Rolling: Total assist, +2 for physical assistance, +2  for safety/equipment         General bed mobility comments: total assist +2 to roll and place sling. maxi sky to recliner    Transfers Overall transfer level: Needs assistance   Transfers: Bed to chair/wheelchair/BSC             General transfer comment: [place] bed to chair Transfer via Lift Equipment: [place]  Ambulation/Gait                   Stairs             Wheelchair Mobility     Tilt Bed    Modified Rankin (Stroke Patients Only)       Balance Overall balance assessment: Needs assistance Sitting-balance support: Feet supported, Bilateral upper extremity supported (supported in recliner) Sitting balance-Leahy Scale: Poor Sitting balance - Comments: R lateral lean, supported with pillow to maintain midline posture Postural control: Right lateral lean                                  Communication Communication Communication: Impaired Factors Affecting Communication: Trach/intubated  Cognition Arousal: Alert Behavior During Therapy: Flat affect   PT - Cognitive impairments: Difficult to assess Difficult to assess due to: Tracheostomy                     PT - Cognition Comments: Pt follows left sided commands Following commands: Impaired Following commands impaired: Follows one step commands inconsistently    Cueing Cueing Techniques: Verbal cues, Tactile cues  Exercises General Exercises - Lower Extremity Heel Slides: PROM, 5 reps, Supine, Right Hip ABduction/ADduction: PROM, 5 reps, Supine, Right    General Comments General comments (skin integrity, edema, etc.): pt in recliner,  significant other at side.  VSS during session with decreased RR once upright in chair.      Pertinent Vitals/Pain Pain Assessment Pain Assessment: CPOT Facial Expression: Relaxed, neutral Body Movements: Absence of movements Muscle Tension: Relaxed Compliance with ventilator (intubated pts.): N/A Vocalization (extubated  pts.): N/A CPOT Total: 0    Home Living                          Prior Function            PT Goals (current goals can now be found in the care plan section) Acute Rehab PT Goals Time For Goal Achievement: 12/13/23 Potential to Achieve Goals: Fair Progress towards PT goals: Progressing toward goals    Frequency    Min 2X/week      PT Plan      Co-evaluation PT/OT/SLP Co-Evaluation/Treatment: Yes Reason for Co-Treatment: To address functional/ADL transfers;For patient/therapist safety;Necessary to address cognition/behavior during functional activity;Complexity of the patient's impairments (multi-system involvement) PT goals addressed during session: Mobility/safety with mobility OT goals addressed during session: ADL's and self-care      AM-PAC PT "6 Clicks" Mobility   Outcome Measure  Help needed turning from your back to your side while in a flat bed without using bedrails?: Total Help needed moving from lying on your back to sitting on the side of a flat bed without using bedrails?: Total Help needed moving to and from a bed to a chair (including a wheelchair)?: Total Help needed standing up from a chair using your arms (e.g., wheelchair or bedside chair)?: Total Help needed to walk in hospital room?: Total Help needed climbing 3-5 steps with a railing? : Total 6 Click Score: 6    End of Session Equipment Utilized During Treatment: Oxygen Activity Tolerance: Patient tolerated treatment well Patient left: in chair;with call bell/phone within reach;with chair alarm set Nurse Communication: Mobility status PT Visit Diagnosis: Muscle weakness (generalized) (M62.81);Difficulty in walking, not elsewhere classified (R26.2);Other symptoms and signs involving the nervous system (R29.898);Hemiplegia and hemiparesis Hemiplegia - Right/Left: Right Hemiplegia - dominant/non-dominant: Dominant Hemiplegia - caused by: Nontraumatic intracerebral hemorrhage      Time: 0946-1020 PT Time Calculation (min) (ACUTE ONLY): 34 min  Charges:    $Therapeutic Activity: 8-22 mins PT General Charges $$ ACUTE PT VISIT: 1 Visit                     Lillia Pauls, PT, DPT Acute Rehabilitation Services Office [phone]    [name] 11/29/2023, 1:25 PM

## 2023-11-29 NOTE — TOC Progression Note (Signed)
 Transition of Care [facility]) - Progression Note    Patient Details  Name: Armanie Martine MRN: [id number] Date of Birth: 03-06-1976  [facility] CM/SW Contact  Mearl Latin, LCSW Phone Number: 11/29/2023, 3:36 PM  Clinical Narrative:    [place] assessing patient for ability to be in a semi private room. CSW updated Tammy that patient is expected to be on IV abx til the 16th.   Expected Discharge Plan: Long Term Nursing Home Barriers to Discharge: Continued Medical Work up, SNF Pending Medicaid, SNF Pending bed offer, SNF Pending payor source - LOG, Inadequate or no insurance (New trach)  Expected Discharge Plan and Services In-house Referral: Clinical Social Work, Hospice / Palliative Care   Post Acute Care Choice: Skilled Nursing Facility Living arrangements for the past 2 months: Single Family Home                                       Social Determinants of Health (SDOH) Interventions SDOH Screenings   Food Insecurity: Patient Unable To Answer (10/29/2023)  Social Connections: Unknown (06/23/2023)   Received from [facility]  Tobacco Use: High Risk (10/31/2023)    Readmission Risk Interventions     No data to display

## 2023-11-30 DIAGNOSIS — J9601 Acute respiratory failure with hypoxia: Secondary | ICD-10-CM | POA: Diagnosis not present

## 2023-11-30 DIAGNOSIS — R1310 Dysphagia, unspecified: Secondary | ICD-10-CM | POA: Diagnosis not present

## 2023-11-30 DIAGNOSIS — I619 Nontraumatic intracerebral hemorrhage, unspecified: Secondary | ICD-10-CM | POA: Diagnosis not present

## 2023-11-30 LAB — BASIC METABOLIC PANEL WITH GFR
Anion gap: 9 (ref 5–15)
BUN: 19 mg/dL (ref 6–20)
CO2: 24 mmol/L (ref 22–32)
Calcium: 8.9 mg/dL (ref 8.9–10.3)
Chloride: 105 mmol/L (ref 98–111)
Creatinine, Ser: 0.92 mg/dL (ref 0.61–1.24)
GFR, Estimated: >60 mL/min (ref >60)
Glucose, Bld: 99 mg/dL (ref 70–99)
Potassium: 4.2 mmol/L (ref 3.5–5.1)
Sodium: 138 mmol/L (ref 135–145)

## 2023-11-30 LAB — CBC
HCT: 27.3 % — ABNORMAL LOW (ref 39.0–52.0)
Hemoglobin: 8.5 g/dL — ABNORMAL LOW (ref 13.0–17.0)
MCH: 31.6 pg (ref 26.0–34.0)
MCHC: 31.1 g/dL (ref 30.0–36.0)
MCV: 101.5 fL — ABNORMAL HIGH (ref 80.0–100.0)
Platelets: 403 10*3/uL — ABNORMAL HIGH (ref 150–400)
RBC: 2.69 MIL/uL — ABNORMAL LOW (ref 4.22–5.81)
RDW: 14 % (ref 11.5–15.5)
WBC: 8.6 10*3/uL (ref 4.0–10.5)
nRBC: 0 % (ref 0.0–0.2)

## 2023-11-30 LAB — MAGNESIUM: Magnesium: 2 mg/dL (ref 1.7–2.4)

## 2023-11-30 LAB — GLUCOSE, CAPILLARY
Glucose-Capillary: 95 mg/dL (ref 70–99)
Glucose-Capillary: 116 mg/dL — ABNORMAL HIGH (ref 70–99)
Glucose-Capillary: 135 mg/dL — ABNORMAL HIGH (ref 70–99)
Glucose-Capillary: 201 mg/dL — ABNORMAL HIGH (ref 70–99)
Glucose-Capillary: 98 mg/dL (ref 70–99)
Glucose-Capillary: 98 mg/dL (ref 70–99)

## 2023-11-30 LAB — PHOSPHORUS: Phosphorus: 3.8 mg/dL (ref 2.5–4.6)

## 2023-11-30 MED ORDER — MODAFINIL 100 MG PO TABS
100.0000 mg | ORAL_TABLET | Freq: Every day | ORAL | Status: DC
  Administered 2023-12-01 – 2023-12-23 (×23): 100 mg
  Filled 2023-11-30 (×23): qty 1

## 2023-11-30 MED ORDER — POLYETHYLENE GLYCOL 3350 17 G PO PACK
17.0000 g | PACK | Freq: Every day | ORAL | Status: DC | PRN
  Administered 2023-12-09 – 2024-01-17 (×8): 17 g
  Filled 2023-11-30 (×8): qty 1

## 2023-11-30 NOTE — Progress Notes (Addendum)
 NAME:  Garrett [name], MRN:  [id number], DOB:  1975/09/03, LOS: 34 ADMISSION DATE:  10/27/2023, CONSULTATION DATE:  10/27/2023 REFERRING MD:  Florentino Hurdle, DO CHIEF COMPLAINT:  Found Down  History of Present Illness:  48 y/o male with past medical history of hypertension who was found down for unknown downtime by his fiance when she came home from work. Reportedly, having agonal breathing.  He apparently had driven her to work this morning.  He has HTN but never checks his BP and does not follow with MD.  She says you can tell when his BP is really high; his eyes get blood shot and he is sweating.  No h/o seizures.  Besides almost daily [name] and cigarette smoking, she is not aware of any other drugs. Drug screen positive for opioids and he was given several doses of Narcan. Patient found to have:  1. 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 Basal cisterns and fourth ventricle are effaced without hydrocephalus at this time. 2. Trace additional subarachnoid hemorrhage along the left parietal convexity. BP in ED 262/156. His CXR revealed a RUL and perihilar infiltrates suggesting aspiration pneumonia. ED labs also revealed PH 7.169, LA 4.4, CPK 985. Patient was intubated in ED.  Pertinent  Medical History  hypertension  Significant Hospital Events: Including procedures, antibiotic start and stop dates in addition to other pertinent events   3/8: Intubated in ED, pontine bleed/SAH. CT head 17:09 Acute large hemorrhage 4.1 cm in pons with intraventricular extension without hydrocephalus 3/9: CTA 03:14 AM No change in IPH in pons, similar biparietal [facility] 3/14: Now awake, appears locked in can follow commands with eyes. 3/16 Purposeful with left upper extremity  3/22 tracheostomy placed at bedside, bleeding issues overnight from trach site 3/24 PEG (Dr. Hildy Lowers) 3/24  sputum culture with MRSA 3/27 vomited. TF on hold. Abd film c/w ileus. Added reglan, got SSE. Did tolerate PSV all day  3/28 BMs x2 after SSE day prior. Added back TFs at 1/2 rated. Tolerated PS 3/29 Tolerated PS  3/31 Tolerating CPAP PS 15/5, TF on hold due to ileus. 4/1: con't to hold tube feeds today, restarting vancomycin and sending tracheal aspirate as peaks are 37, plat 24, driving 19. Thick secretions. Fever overnight.  4/2: peaks improved. Ongoing hiccups. Trickle feeding. Neuro exam unchanged.   Interim History / Subjective:  Has been on trach collar for 26 h. No further prolonged coughing spells.  Objective   Blood pressure 122/86, pulse 75, temperature 99.3 F (37.4 C), temperature source Axillary, resp. rate (!) 27, height 5\' 8"  (1.727 m), weight 101.4 kg, SpO2 98%.    FiO2 (%):  [28 %-50 %] 28 %   Intake/Output Summary (Last 24 hours) at 11/30/2023 1220 Last data filed at 11/30/2023 1200 Gross per 24 hour  Intake 1210 ml  Output 1390 ml  Net -180 ml   Filed Weights   11/28/23 0500 11/29/23 0500 11/30/23 0500  Weight: 101 kg 100.3 kg 101.4 kg   Physical Exam: General: Large man lying in bed, no distress minimally responsive.  HEENT: Tracheostomy in place. Minimal secretions. Chest: Clear bilaterally CV: HS normal  Abdomen: PEG in place.  Extremities: No edema. Neuro: eyes open, extensor response to pain. Not following commands, doesn't track to voice.  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  Macrocytic anemia  Thrombocytosis - improving Persistent L eye chemosis Poor Dentition   - Appreciate optho recs: c/w exposure keratosis, local wound care and coverage of L eye; give time - linezolid for persistent MRSA bronchitis/PNA;  tenetative plan 7 days (end 4/16) - TF to goal - Open trach collar trial - transfer out if can tolerate 48h.  - Severe paroxysms of cough appear to have interrupted trach collar trials. Can try gabapentin or amitriptyline.   - Will stop Urecholine as now voiding spontaneously.  - Trial of Provigil to improve alertness  Best Practice (right click and "Reselect all SmartList Selections" daily)   Diet/type: tubefeeds and NPO DVT prophylaxis LMWH Pressure ulcer(s): N/A GI prophylaxis: H2B Lines: N/A Foley:  NA  Code Status:  DNR with pre-arrest interventions    Quran Vasco [place] [facility] 11/30/2023 12:20 PM  Pager: see AMION  If no response to pager , please call critical care on call (see AMION) until 7pm After 7:00 pm call Elink

## 2023-11-30 NOTE — Plan of Care (Signed)
  Problem: Education: Goal: Knowledge of disease or condition will improve Outcome: Not Progressing   Problem: Intracerebral Hemorrhage Tissue Perfusion: Goal: Complications of Intracerebral Hemorrhage will be minimized Outcome: Not Progressing   Problem: Coping: Goal: Will verbalize positive feelings about self Outcome: Not Progressing Goal: Will identify appropriate support needs Outcome: Not Progressing   Problem: Health Behavior/Discharge Planning: Goal: Ability to manage health-related needs will improve Outcome: Not Progressing Goal: Goals will be collaboratively established with patient/family Outcome: Progressing   Problem: Self-Care: Goal: Ability to participate in self-care as condition permits will improve Outcome: Not Progressing Goal: Verbalization of feelings and concerns over difficulty with self-care will improve Outcome: Not Progressing Goal: Ability to communicate needs accurately will improve Outcome: Not Progressing   Problem: Nutrition: Goal: Risk of aspiration will decrease Outcome: Progressing Goal: Dietary intake will improve Outcome: Progressing   Problem: Coping: Goal: Ability to adjust to condition or change in health will improve Outcome: Not Progressing   Problem: Fluid Volume: Goal: Ability to maintain a balanced intake and output will improve Outcome: Progressing   Problem: Health Behavior/Discharge Planning: Goal: Ability to identify and utilize available resources and services will improve Outcome: Not Progressing Goal: Ability to manage health-related needs will improve Outcome: Not Progressing   Problem: Metabolic: Goal: Ability to maintain appropriate glucose levels will improve Outcome: Progressing   Problem: Nutritional: Goal: Maintenance of adequate nutrition will improve Outcome: Progressing Goal: Progress toward achieving an optimal weight will improve Outcome: Progressing   Problem: Skin Integrity: Goal: Risk for  impaired skin integrity will decrease Outcome: Progressing   Problem: Tissue Perfusion: Goal: Adequacy of tissue perfusion will improve Outcome: Progressing   Problem: Education: Goal: Knowledge of General Education information will improve Description: Including pain rating scale, medication(s)/side effects and non-pharmacologic comfort measures Outcome: Not Progressing   Problem: Health Behavior/Discharge Planning: Goal: Ability to manage health-related needs will improve Outcome: Not Progressing   Problem: Clinical Measurements: Goal: Ability to maintain clinical measurements within normal limits will improve Outcome: Progressing Goal: Will remain free from infection Outcome: Not Progressing Goal: Diagnostic test results will improve Outcome: Progressing Goal: Respiratory complications will improve Outcome: Progressing Goal: Cardiovascular complication will be avoided Outcome: Progressing   Problem: Activity: Goal: Risk for activity intolerance will decrease Outcome: Not Progressing   Problem: Nutrition: Goal: Adequate nutrition will be maintained Outcome: Progressing   Problem: Elimination: Goal: Will not experience complications related to bowel motility Outcome: Progressing Goal: Will not experience complications related to urinary retention Outcome: Not Progressing   Problem: Pain Managment: Goal: General experience of comfort will improve and/or be controlled Outcome: Progressing   Problem: Safety: Goal: Ability to remain free from injury will improve Outcome: Progressing   Problem: Skin Integrity: Goal: Risk for impaired skin integrity will decrease Outcome: Not Progressing

## 2023-12-01 DIAGNOSIS — J9601 Acute respiratory failure with hypoxia: Secondary | ICD-10-CM | POA: Diagnosis not present

## 2023-12-01 DIAGNOSIS — I619 Nontraumatic intracerebral hemorrhage, unspecified: Secondary | ICD-10-CM | POA: Diagnosis not present

## 2023-12-01 DIAGNOSIS — R1310 Dysphagia, unspecified: Secondary | ICD-10-CM | POA: Diagnosis not present

## 2023-12-01 LAB — GLUCOSE, CAPILLARY
Glucose-Capillary: 117 mg/dL — ABNORMAL HIGH (ref 70–99)
Glucose-Capillary: 134 mg/dL — ABNORMAL HIGH (ref 70–99)
Glucose-Capillary: 156 mg/dL — ABNORMAL HIGH (ref 70–99)
Glucose-Capillary: 135 mg/dL — ABNORMAL HIGH (ref 70–99)
Glucose-Capillary: 110 mg/dL — ABNORMAL HIGH (ref 70–99)
Glucose-Capillary: 116 mg/dL — ABNORMAL HIGH (ref 70–99)

## 2023-12-01 LAB — MAGNESIUM: Magnesium: 2 mg/dL (ref 1.7–2.4)

## 2023-12-01 LAB — CBC
HCT: 28.4 % — ABNORMAL LOW (ref 39.0–52.0)
Hemoglobin: 8.8 g/dL — ABNORMAL LOW (ref 13.0–17.0)
MCH: 31 pg (ref 26.0–34.0)
MCHC: 31 g/dL (ref 30.0–36.0)
MCV: 100 fL (ref 80.0–100.0)
Platelets: 394 10*3/uL (ref 150–400)
RBC: 2.84 MIL/uL — ABNORMAL LOW (ref 4.22–5.81)
RDW: 14 % (ref 11.5–15.5)
WBC: 7.6 10*3/uL (ref 4.0–10.5)
nRBC: 0 % (ref 0.0–0.2)

## 2023-12-01 LAB — BASIC METABOLIC PANEL WITH GFR
Anion gap: 9 (ref 5–15)
BUN: 19 mg/dL (ref 6–20)
CO2: 26 mmol/L (ref 22–32)
Calcium: 9.1 mg/dL (ref 8.9–10.3)
Chloride: 103 mmol/L (ref 98–111)
Creatinine, Ser: 0.89 mg/dL (ref 0.61–1.24)
GFR, Estimated: >60 mL/min (ref >60)
Glucose, Bld: 109 mg/dL — ABNORMAL HIGH (ref 70–99)
Potassium: 3.9 mmol/L (ref 3.5–5.1)
Sodium: 138 mmol/L (ref 135–145)

## 2023-12-01 LAB — PHOSPHORUS: Phosphorus: 3.6 mg/dL (ref 2.5–4.6)

## 2023-12-01 NOTE — Progress Notes (Signed)
 NAME:  Garrett [name], MRN:  [id number], DOB:  [birth year], LOS: 35 ADMISSION DATE:  10/27/2023, CONSULTATION DATE:  10/27/2023 REFERRING MD:  Florentino Hurdle, DO CHIEF COMPLAINT:  Found Down  History of Present Illness:  48 y/o male with past medical history of hypertension who was found down for unknown downtime by his fiance when she came home from work. Reportedly, having agonal breathing.  He apparently had driven her to work this morning.  He has HTN but never checks his BP and does not follow with MD.  She says you can tell when his BP is really high; his eyes get blood shot and he is sweating.  No h/o seizures.  Besides almost daily [name] and cigarette smoking, she is not aware of any other drugs. Drug screen positive for opioids and he was given several doses of Narcan. Patient found to have:  1. 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 Basal cisterns and fourth ventricle are effaced without hydrocephalus at this time. 2. Trace additional subarachnoid hemorrhage along the left parietal convexity. BP in ED 262/156. His CXR revealed a RUL and perihilar infiltrates suggesting aspiration pneumonia. ED labs also revealed PH 7.169, LA 4.4, CPK 985. Patient was intubated in ED.  Pertinent  Medical History  hypertension  Significant Hospital Events: Including procedures, antibiotic start and stop dates in addition to other pertinent events   3/8: Intubated in ED, pontine bleed/SAH. CT head 17:09 Acute large hemorrhage 4.1 cm in pons with intraventricular extension without hydrocephalus 3/9: CTA 03:14 AM No change in IPH in pons, similar [facility] 3/14: Now awake, appears locked in can follow commands with eyes. 3/16 Purposeful with left upper extremity  3/22 tracheostomy placed at bedside, bleeding issues overnight from trach site 3/24 PEG (Dr. Hildy Lowers) 3/24  sputum culture with MRSA 3/27 vomited. TF on hold. Abd film c/w ileus. Added reglan, got SSE. Did tolerate PSV all day  3/28 BMs x2 after SSE day prior. Added back TFs at 1/2 rated. Tolerated PS 3/29 Tolerated PS  3/31 Tolerating CPAP PS 15/5, TF on hold due to ileus. 4/1: con't to hold tube feeds today, restarting vancomycin and sending tracheal aspirate as peaks are 37, plat 24, driving 19. Thick secretions. Fever overnight.  4/2: peaks improved. Ongoing hiccups. Trickle feeding. Neuro exam unchanged.   Interim History / Subjective:  Has been on trach collar for 48 h. No further prolonged coughing spells.  Objective   Blood pressure (!) 140/95, pulse 79, temperature 98.3 F (36.8 C), temperature source Axillary, resp. rate (!) 28, height 5\' 8"  (1.727 m), weight 100.8 kg, SpO2 99%.    FiO2 (%):  [28 %] 28 %   Intake/Output Summary (Last 24 hours) at 12/01/2023 1119 Last data filed at 12/01/2023 1000 Gross per 24 hour  Intake 1500 ml  Output 1220 ml  Net 280 ml   Filed Weights   11/29/23 0500 11/30/23 0500 12/01/23 0500  Weight: 100.3 kg 101.4 kg 100.8 kg   Physical Exam: General: Large man lying in bed, no distress minimally responsive.  HEENT: Tracheostomy in place. Minimal secretions. Chest: Clear bilaterally CV: HS normal  Abdomen: PEG in place.  Extremities: No edema. Neuro: eyes open, extensor response to pain. Not following commands, doesn't track to voice.  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  Macrocytic anemia  Thrombocytosis - improving Persistent L eye chemosis Poor Dentition   - Appreciate optho recs: c/w exposure keratosis, local wound care and coverage of L eye; give time - linezolid for persistent MRSA bronchitis/PNA; tenetative plan  7 days (end 4/16) - TF to goal - To remain on trach collar.  - Severe paroxysms of cough appear to have interrupted trach collar trials. Can try gabapentin or amitriptyline.   - Continue Provigil to improve alertness.  - Ready for transfer, needs SNF placement.   Best Practice (right click and "Reselect all SmartList Selections" daily)   Diet/type: tubefeeds and NPO DVT prophylaxis LMWH Pressure ulcer(s): N/A GI prophylaxis: H2B Lines: N/A Foley:  NA  Code Status:  DNR with pre-arrest interventions    Kobey Sides [facility] 12/01/2023 11:19 AM  Pager: see AMION  If no response to pager , please call critical care on call (see AMION) until 7pm After 7:00 pm call Elink

## 2023-12-02 DIAGNOSIS — I629 Nontraumatic intracranial hemorrhage, unspecified: Secondary | ICD-10-CM | POA: Diagnosis not present

## 2023-12-02 DIAGNOSIS — Z930 Tracheostomy status: Secondary | ICD-10-CM | POA: Diagnosis not present

## 2023-12-02 DIAGNOSIS — I613 Nontraumatic intracerebral hemorrhage in brain stem: Secondary | ICD-10-CM | POA: Diagnosis not present

## 2023-12-02 LAB — GLUCOSE, CAPILLARY
Glucose-Capillary: 112 mg/dL — ABNORMAL HIGH (ref 70–99)
Glucose-Capillary: 114 mg/dL — ABNORMAL HIGH (ref 70–99)
Glucose-Capillary: 129 mg/dL — ABNORMAL HIGH (ref 70–99)
Glucose-Capillary: 95 mg/dL (ref 70–99)
Glucose-Capillary: 96 mg/dL (ref 70–99)
Glucose-Capillary: 97 mg/dL (ref 70–99)

## 2023-12-02 NOTE — Progress Notes (Signed)
 Pt transferred to 4N 05, without any complications, his financee accompanied him with her belongings.

## 2023-12-02 NOTE — Plan of Care (Signed)
  Problem: Intracerebral Hemorrhage Tissue Perfusion: Goal: Complications of Intracerebral Hemorrhage will be minimized Outcome: Progressing   Problem: Health Behavior/Discharge Planning: Goal: Goals will be collaboratively established with patient/family Outcome: Progressing   Problem: Nutrition: Goal: Risk of aspiration will decrease Outcome: Progressing Goal: Dietary intake will improve Outcome: Progressing   Problem: Education: Goal: Individualized Educational Video(s) Outcome: Progressing   Problem: Coping: Goal: Ability to adjust to condition or change in health will improve Outcome: Progressing   Problem: Fluid Volume: Goal: Ability to maintain a balanced intake and output will improve Outcome: Progressing   Problem: Metabolic: Goal: Ability to maintain appropriate glucose levels will improve Outcome: Progressing   Problem: Nutritional: Goal: Maintenance of adequate nutrition will improve Outcome: Progressing Goal: Progress toward achieving an optimal weight will improve Outcome: Progressing   Problem: Tissue Perfusion: Goal: Adequacy of tissue perfusion will improve Outcome: Progressing   Problem: Clinical Measurements: Goal: Ability to maintain clinical measurements within normal limits will improve Outcome: Progressing Goal: Will remain free from infection Outcome: Progressing Goal: Diagnostic test results will improve Outcome: Progressing Goal: Respiratory complications will improve Outcome: Progressing Goal: Cardiovascular complication will be avoided Outcome: Progressing   Problem: Coping: Goal: Level of anxiety will decrease Outcome: Progressing   Problem: Elimination: Goal: Will not experience complications related to bowel motility Outcome: Progressing Goal: Will not experience complications related to urinary retention Outcome: Progressing   Problem: Pain Managment: Goal: General experience of comfort will improve and/or be  controlled Outcome: Progressing   Problem: Safety: Goal: Ability to remain free from injury will improve Outcome: Progressing   Problem: Education: Goal: 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 Outcome: Progressing   Problem: Activity: Goal: Ability to tolerate increased activity will improve Outcome: Progressing

## 2023-12-02 NOTE — Progress Notes (Signed)
 Bladder scan performed by [facility]. reported. Straight catheterization performed at bedside without incident. collected and documented in patient's chart. Pericare given post catheterization. Will continue to monitor.

## 2023-12-02 NOTE — Plan of Care (Signed)
 Problem: Education: Goal: Knowledge of disease or condition will improve Outcome: Progressing Goal: Knowledge of secondary prevention will improve (MUST DOCUMENT ALL) Outcome: Progressing Goal: Knowledge of patient specific risk factors will improve (DELETE if not current risk factor) Outcome: Progressing   Problem: Intracerebral Hemorrhage Tissue Perfusion: Goal: Complications of Intracerebral Hemorrhage will be minimized Outcome: Progressing   Problem: Coping: Goal: Will verbalize positive feelings about self Outcome: Progressing Goal: Will identify appropriate support needs Outcome: Progressing   Problem: Health Behavior/Discharge Planning: Goal: Ability to manage health-related needs will improve Outcome: Progressing Goal: Goals will be collaboratively established with patient/family Outcome: Progressing   Problem: Self-Care: Goal: Ability to participate in self-care as condition permits will improve Outcome: Progressing Goal: Verbalization of feelings and concerns over difficulty with self-care will improve Outcome: Progressing Goal: Ability to communicate needs accurately will improve Outcome: Progressing   Problem: Nutrition: Goal: Risk of aspiration will decrease Outcome: Progressing Goal: Dietary intake will improve Outcome: Progressing   Problem: Education: Goal: Ability to describe self-care measures that may prevent or decrease complications (Diabetes Survival Skills Education) will improve Outcome: Progressing Goal: Individualized Educational Video(s) Outcome: Progressing   Problem: Coping: Goal: Ability to adjust to condition or change in health will improve Outcome: Progressing   Problem: Fluid Volume: Goal: Ability to maintain a balanced intake and output will improve Outcome: Progressing   Problem: Health Behavior/Discharge Planning: Goal: Ability to identify and utilize available resources and services will improve Outcome: Progressing Goal:  Ability to manage health-related needs will improve Outcome: Progressing   Problem: Metabolic: Goal: Ability to maintain appropriate glucose levels will improve Outcome: Progressing   Problem: Nutritional: Goal: Maintenance of adequate nutrition will improve Outcome: Progressing Goal: Progress toward achieving an optimal weight will improve Outcome: Progressing   Problem: Skin Integrity: Goal: Risk for impaired skin integrity will decrease Outcome: Progressing   Problem: Tissue Perfusion: Goal: Adequacy of tissue perfusion will improve Outcome: Progressing   Problem: Education: Goal: Knowledge of General Education information will improve Description: Including pain rating scale, medication(s)/side effects and non-pharmacologic comfort measures Outcome: Progressing   Problem: Health Behavior/Discharge Planning: Goal: Ability to manage health-related needs will improve Outcome: Progressing   Problem: Clinical Measurements: Goal: Ability to maintain clinical measurements within normal limits will improve Outcome: Progressing Goal: Will remain free from infection Outcome: Progressing Goal: Diagnostic test results will improve Outcome: Progressing Goal: Respiratory complications will improve Outcome: Progressing Goal: Cardiovascular complication will be avoided Outcome: Progressing   Problem: Activity: Goal: Risk for activity intolerance will decrease Outcome: Progressing   Problem: Nutrition: Goal: Adequate nutrition will be maintained Outcome: Progressing   Problem: Coping: Goal: Level of anxiety will decrease Outcome: Progressing   Problem: Elimination: Goal: Will not experience complications related to bowel motility Outcome: Progressing Goal: Will not experience complications related to urinary retention Outcome: Progressing   Problem: Pain Managment: Goal: General experience of comfort will improve and/or be controlled Outcome: Progressing   Problem:  Safety: Goal: Ability to remain free from injury will improve Outcome: Progressing   Problem: Skin Integrity: Goal: Risk for impaired skin integrity will decrease Outcome: Progressing   Problem: Education: Goal: 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 Outcome: Progressing   Problem: Activity: Goal: Ability to tolerate increased activity will improve Outcome: Progressing   Problem: Health Behavior/Discharge Planning: Goal: Ability to manage tracheostomy will improve Outcome: Progressing   Problem: Respiratory: Goal: Patent airway maintenance will improve Outcome: Progressing   Problem: Role Relationship: Goal: Ability to communicate will improve Outcome: Progressing

## 2023-12-02 NOTE — Progress Notes (Signed)
 PROGRESS NOTE    Garrett [name]  GNF:[id number] DOB: 02/27/1976 DOA: 10/27/2023 PCP: Pcp, No   Brief Narrative: Garrett [name] is a 48 y.o. male with a history of hypertension.  Patient presented secondary to being found down with agonal bleeding by his fiancee. 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 The hemorrhage was initially assessed with a CT scan, revealing a 4.1 cm acute lesion, and was followed up with repeat imaging showing a decrease in size over time, although subarachnoid hemorrhage was also noted on subsequent scans. The patient was managed for primary hypertension and hypertensive emergency, with Cleviprex initiated in the ICU, which was later weaned off. Due to dysphagia, a PEG tube was placed on 3/24. The patient also experienced acute respiratory failure secondary to aspiration pneumonia, requiring intubation on 3/8 and subsequent tracheostomy on 3/22. MRSA pneumonia was identified in tracheal aspirates, and the patient was initially treated with Unasyn, later switched to Vancomycin, and transitioned to Linezolid to complete the course. The patient's ileus resolved, and no new intracranial abnormalities were noted after the subacute phase of the hemorrhage. Tracheostomy care was provided by pulmonology, and Delfin Fell was continued for respiratory management.   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 Code stroke CT head significant for an acute large 4.1 cm hemorrhage centered in the pons with intraventricular extension into the fourth ventricle with extension into the basal cisterns. Repeat CT (3/9) without significant change and repeat CT (3/18) significant for interval decreased size of subacute pontine hemorrhage. CT head (3/29) significant for evolving  subacute pontine hemorrhage with subarachnoid hemorrhage and no new intracranial abnormality. Transthoracic Echocardiogram significant for moderate concentric LVH with an LVEF of 60-65%. LDL of 96. Hemoglobin A1C of 4.7%. No antithrombotic secondary to intracerebral hemorrhage.  Primary hypertension Hypertensive emergency Patient initial managed with Cleviprex while in ICU which was weaned off. -Continue amlodipine and metoprolol  Dysphagia PEG tube placed on 3/24. Patient seen by speech therapy. -Continue tube feeds  Ileus Resolved.  Acute respiratory failure with hypoxia S/p tracheostomy Secondary to aspiration pneumonia. Patient required intubation on 3/8 and managed via mechanical ventilation. Patient converted to tracheostomy on 3/22. -Tracheostomy care per pulmonology -Continue Yupelri  Aspiration pneumonia MRSA pneumonia Patient treated empirically with Unasyn. Tracheal aspirate (4/1) significant for MRSA. Patient treated with Vancomycin with prolonged treatment for increased secretions and persistently positive tracheal aspirate (4/8). Patient transitioned to Linezolid to complete treatment. -Continue Linezolid  Macrocytic anemia Stable.  Thrombocytosis Resolved.  Poor dention Noted.  AKI Resolved.  Hypernatremia Resolved with fluids.  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 Patient seen by ophthalmology. -Ophthalmology recommendations (4/7): maxitrol ointment TID x1 week followed by Refresh PM TID-QID for chronic lubrication; can tape eyelid closed if exposure is still an issue (plastic tape rather than paper tape and tape eye directly rather than with gauze)  Pressure injury Medial perineum. Not present on admission.   DVT prophylaxis: Lovenox Code Status:   Code Status: Do not attempt resuscitation (DNR) PRE-ARREST INTERVENTIONS DESIRED Family Communication: None at bedside Disposition Plan: Discharge eventually to SNF when medically stable   Consultants:   PCCM Ophthalmology  Procedures:    Antimicrobials:     Subjective: Patient is nonverbal  Objective: BP (!) 164/106 (BP Location: Left Arm)   Pulse 76   Temp 98.7 F (37.1 C) (Axillary)   Resp 19  Ht 5\' 8"  (1.727 m)   Wt 101.9 kg   SpO2 98%   BMI 34.16 kg/m   Examination:  General exam: Appears calm and comfortable Respiratory system: Rhonchi. Respiratory effort normal. Cardiovascular system: S1 & S2 heard, RRR. Gastrointestinal system: Abdomen is nondistended, soft and nontender. Normal bowel sounds heard. Central nervous system: Alert but does not track or acknowledge my presence.   Data Reviewed: I have personally reviewed following labs and imaging studies  CBC Lab Results  Component Value Date   WBC 7.6 12/01/2023   RBC 2.84 (L) 12/01/2023   HGB 8.8 (L) 12/01/2023   HCT 28.4 (L) 12/01/2023   MCV 100.0 12/01/2023   MCH 31.0 12/01/2023   PLT 394 12/01/2023   MCHC 31.0 12/01/2023   RDW 14.0 12/01/2023   LYMPHSABS 2.6 11/20/2023   MONOABS 1.4 (H) 11/20/2023   EOSABS 0.1 11/20/2023   BASOSABS 0.0 11/20/2023     Last metabolic panel Lab Results  Component Value Date   NA 138 12/01/2023   K 3.9 12/01/2023   CL 103 12/01/2023   CO2 26 12/01/2023   BUN 19 12/01/2023   CREATININE 0.89 12/01/2023   GLUCOSE 109 (H) 12/01/2023   GFRNONAA >60 12/01/2023   GFRAA >60 01/15/2018   CALCIUM 9.1 12/01/2023   PHOS 3.6 12/01/2023   PROT 5.6 (L) 10/27/2023   ALBUMIN 2.8 (L) 10/27/2023   BILITOT 0.4 10/27/2023   ALKPHOS 53 10/27/2023   AST 39 10/27/2023   ALT 17 10/27/2023   ANIONGAP 9 12/01/2023    GFR: Estimated Creatinine Clearance: 118.7 mL/min (by C-G formula based on SCr of 0.89 mg/dL).  Recent Results (from the past 240 hours)  Culture, Respiratory w Gram Stain     Status: None   Collection Time: 11/27/23  7:22 AM   Specimen: Tracheal Aspirate; Respiratory  Result Value Ref Range Status   Specimen Description TRACHEAL ASPIRATE  Final    Special Requests NONE  Final   Gram Stain   Final    MODERATE WBC PRESENT, PREDOMINANTLY PMN FEW GRAM POSITIVE COCCI IN CLUSTERS FEW GRAM NEGATIVE RODS FEW GRAM POSITIVE RODS Performed at [facility], [address], [place], [name] [address]    Culture   Final    MODERATE METHICILLIN RESISTANT STAPHYLOCOCCUS AUREUS   Report Status 11/29/2023 FINAL  Final   Organism ID, Bacteria METHICILLIN RESISTANT STAPHYLOCOCCUS AUREUS  Final      Susceptibility   Methicillin resistant staphylococcus aureus - MIC*    CIPROFLOXACIN <=0.5 SENSITIVE Sensitive     ERYTHROMYCIN >=8 RESISTANT Resistant     GENTAMICIN <=0.5 SENSITIVE Sensitive     OXACILLIN >=4 RESISTANT Resistant     TETRACYCLINE >=16 RESISTANT Resistant     VANCOMYCIN 1 SENSITIVE Sensitive     TRIMETH/SULFA <=10 SENSITIVE Sensitive     CLINDAMYCIN <=0.25 SENSITIVE Sensitive     RIFAMPIN <=0.5 SENSITIVE Sensitive     Inducible Clindamycin NEGATIVE Sensitive     LINEZOLID 2 SENSITIVE Sensitive     * MODERATE METHICILLIN RESISTANT STAPHYLOCOCCUS AUREUS      Radiology Studies: No results found.    LOS: 36 days    Aneita Keens, MD [facility] 12/02/2023, 8:23 AM   If 7PM-7AM, please contact night-coverage [email or web address]

## 2023-12-02 NOTE — Plan of Care (Signed)
 Problem: Education: Goal: Knowledge of disease or condition will improve 12/02/2023 0646 by Les Rao, RN Outcome: Progressing 12/02/2023 0451 by Les Rao, RN Outcome: Progressing Goal: Knowledge of secondary prevention will improve (MUST DOCUMENT ALL) 12/02/2023 0646 by Les Rao, RN Outcome: Progressing 12/02/2023 0451 by Les Rao, RN Outcome: Progressing Goal: Knowledge of patient specific risk factors will improve (DELETE if not current risk factor) 12/02/2023 0646 by Les Rao, RN Outcome: Progressing 12/02/2023 0451 by Les Rao, RN Outcome: Progressing   Problem: Intracerebral Hemorrhage Tissue Perfusion: Goal: Complications of Intracerebral Hemorrhage will be minimized 12/02/2023 0646 by Les Rao, RN Outcome: Progressing 12/02/2023 0451 by Les Rao, RN Outcome: Progressing   Problem: Coping: Goal: Will verbalize positive feelings about self 12/02/2023 0646 by Les Rao, RN Outcome: Progressing 12/02/2023 0451 by Les Rao, RN Outcome: Progressing Goal: Will identify appropriate support needs 12/02/2023 0646 by Les Rao, RN Outcome: Progressing 12/02/2023 0451 by Les Rao, RN Outcome: Progressing   Problem: Health Behavior/Discharge Planning: Goal: Ability to manage health-related needs will improve 12/02/2023 0646 by Les Rao, RN Outcome: Progressing 12/02/2023 0451 by Les Rao, RN Outcome: Progressing Goal: Goals will be collaboratively established with patient/family 12/02/2023 [phone] by Les Rao, RN Outcome: Progressing 12/02/2023 0451 by Les Rao, RN Outcome: Progressing   Problem: Self-Care: Goal: Ability to participate in self-care as condition permits will improve 12/02/2023 0646 by Les Rao, RN Outcome: Progressing 12/02/2023 0451 by Les Rao, RN Outcome: Progressing Goal: Verbalization of feelings and concerns  over difficulty with self-care will improve 12/02/2023 0646 by Les Rao, RN Outcome: Progressing 12/02/2023 0451 by Les Rao, RN Outcome: Progressing Goal: Ability to communicate needs accurately will improve 12/02/2023 0646 by Les Rao, RN Outcome: Progressing 12/02/2023 0451 by Les Rao, RN Outcome: Progressing   Problem: Nutrition: Goal: Risk of aspiration will decrease 12/02/2023 0646 by Les Rao, RN Outcome: Progressing 12/02/2023 0451 by Les Rao, RN Outcome: Progressing Goal: Dietary intake will improve 12/02/2023 0646 by Les Rao, RN Outcome: Progressing 12/02/2023 0451 by Les Rao, RN Outcome: Progressing   Problem: Education: Goal: Ability to describe self-care measures that may prevent or decrease complications (Diabetes Survival Skills Education) will improve 12/02/2023 0646 by Les Rao, RN Outcome: Progressing 12/02/2023 0451 by Les Rao, RN Outcome: Progressing Goal: Individualized Educational Video(s) 12/02/2023 0646 by Les Rao, RN Outcome: Progressing 12/02/2023 0451 by Les Rao, RN Outcome: Progressing   Problem: Coping: Goal: Ability to adjust to condition or change in health will improve 12/02/2023 0646 by Les Rao, RN Outcome: Progressing 12/02/2023 0451 by Les Rao, RN Outcome: Progressing   Problem: Fluid Volume: Goal: Ability to maintain a balanced intake and output will improve 12/02/2023 0646 by Les Rao, RN Outcome: Progressing 12/02/2023 0451 by Les Rao, RN Outcome: Progressing   Problem: Health Behavior/Discharge Planning: Goal: Ability to identify and utilize available resources and services will improve 12/02/2023 0646 by Les Rao, RN Outcome: Progressing 12/02/2023 0451 by Les Rao, RN Outcome: Progressing Goal: Ability to manage health-related needs will improve 12/02/2023 0646 by  Les Rao, RN Outcome: Progressing 12/02/2023 0451 by Les Rao, RN Outcome: Progressing   Problem: Metabolic: Goal: Ability to maintain appropriate glucose levels will improve 12/02/2023 0646 by Les Rao, RN Outcome: Progressing 12/02/2023 0451 by Les Rao, RN Outcome: Progressing  Problem: Nutritional: Goal: Maintenance of adequate nutrition will improve 12/02/2023 0646 by Les Rao, RN Outcome: Progressing 12/02/2023 0451 by Les Rao, RN Outcome: Progressing Goal: Progress toward achieving an optimal weight will improve 12/02/2023 0646 by Les Rao, RN Outcome: Progressing 12/02/2023 0451 by Les Rao, RN Outcome: Progressing   Problem: Skin Integrity: Goal: Risk for impaired skin integrity will decrease 12/02/2023 0646 by Les Rao, RN Outcome: Progressing 12/02/2023 0451 by Les Rao, RN Outcome: Progressing   Problem: Tissue Perfusion: Goal: Adequacy of tissue perfusion will improve 12/02/2023 0646 by Les Rao, RN Outcome: Progressing 12/02/2023 0451 by Les Rao, RN Outcome: Progressing   Problem: Education: Goal: Knowledge of General Education information will improve Description: Including pain rating scale, medication(s)/side effects and non-pharmacologic comfort measures 12/02/2023 0646 by Les Rao, RN Outcome: Progressing 12/02/2023 0451 by Les Rao, RN Outcome: Progressing   Problem: Health Behavior/Discharge Planning: Goal: Ability to manage health-related needs will improve 12/02/2023 0646 by Les Rao, RN Outcome: Progressing 12/02/2023 0451 by Les Rao, RN Outcome: Progressing   Problem: Clinical Measurements: Goal: Ability to maintain clinical measurements within normal limits will improve 12/02/2023 0646 by Les Rao, RN Outcome: Progressing 12/02/2023 0451 by Les Rao, RN Outcome: Progressing Goal: Will  remain free from infection 12/02/2023 0646 by Les Rao, RN Outcome: Progressing 12/02/2023 0451 by Les Rao, RN Outcome: Progressing Goal: Diagnostic test results will improve 12/02/2023 0646 by Les Rao, RN Outcome: Progressing 12/02/2023 0451 by Les Rao, RN Outcome: Progressing Goal: Respiratory complications will improve 12/02/2023 0646 by Les Rao, RN Outcome: Progressing 12/02/2023 0451 by Les Rao, RN Outcome: Progressing Goal: Cardiovascular complication will be avoided 12/02/2023 0646 by Les Rao, RN Outcome: Progressing 12/02/2023 0451 by Les Rao, RN Outcome: Progressing   Problem: Activity: Goal: Risk for activity intolerance will decrease 12/02/2023 0646 by Les Rao, RN Outcome: Progressing 12/02/2023 0451 by Les Rao, RN Outcome: Progressing   Problem: Nutrition: Goal: Adequate nutrition will be maintained 12/02/2023 0646 by Les Rao, RN Outcome: Progressing 12/02/2023 0451 by Les Rao, RN Outcome: Progressing   Problem: Coping: Goal: Level of anxiety will decrease 12/02/2023 0646 by Les Rao, RN Outcome: Progressing 12/02/2023 0451 by Les Rao, RN Outcome: Progressing   Problem: Elimination: Goal: Will not experience complications related to bowel motility 12/02/2023 0646 by Les Rao, RN Outcome: Progressing 12/02/2023 0451 by Les Rao, RN Outcome: Progressing Goal: Will not experience complications related to urinary retention 12/02/2023 0646 by Les Rao, RN Outcome: Progressing 12/02/2023 0451 by Les Rao, RN Outcome: Progressing   Problem: Pain Managment: Goal: General experience of comfort will improve and/or be controlled 12/02/2023 0646 by Les Rao, RN Outcome: Progressing 12/02/2023 0451 by Les Rao, RN Outcome: Progressing   Problem: Safety: Goal: Ability to remain free from  injury will improve 12/02/2023 0646 by Les Rao, RN Outcome: Progressing 12/02/2023 0451 by Les Rao, RN Outcome: Progressing   Problem: Skin Integrity: Goal: Risk for impaired skin integrity will decrease 12/02/2023 0646 by Les Rao, RN Outcome: Progressing 12/02/2023 0451 by Les Rao, RN Outcome: Progressing   Problem: Education: Goal: 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 12/02/2023 0646 by Les Rao, RN Outcome: Progressing 12/02/2023 0451 by Les Rao, RN Outcome: Progressing   Problem: Activity: Goal: Ability to tolerate increased activity will improve 12/02/2023 0646  by Les Rao, RN Outcome: Progressing 12/02/2023 0451 by Les Rao, RN Outcome: Progressing   Problem: Health Behavior/Discharge Planning: Goal: 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

## 2023-12-02 NOTE — Plan of Care (Signed)
 Problem: Education: Goal: Knowledge of disease or condition will improve 12/02/2023 2033 by Les Rao, RN Outcome: Progressing 12/02/2023 0646 by Les Rao, RN Outcome: Progressing Goal: Knowledge of secondary prevention will improve (MUST DOCUMENT ALL) 12/02/2023 2033 by Les Rao, RN Outcome: Progressing 12/02/2023 0646 by Les Rao, RN Outcome: Progressing Goal: Knowledge of patient specific risk factors will improve (DELETE if not current risk factor) 12/02/2023 2033 by Les Rao, RN Outcome: Progressing 12/02/2023 0646 by Les Rao, RN Outcome: Progressing   Problem: Intracerebral Hemorrhage Tissue Perfusion: Goal: Complications of Intracerebral Hemorrhage will be minimized 12/02/2023 2033 by Les Rao, RN Outcome: Progressing 12/02/2023 0646 by Les Rao, RN Outcome: Progressing   Problem: Coping: Goal: Will verbalize positive feelings about self 12/02/2023 2033 by Les Rao, RN Outcome: Progressing 12/02/2023 0646 by Les Rao, RN Outcome: Progressing Goal: Will identify appropriate support needs 12/02/2023 2033 by Les Rao, RN Outcome: Progressing 12/02/2023 0646 by Les Rao, RN Outcome: Progressing   Problem: Health Behavior/Discharge Planning: Goal: Ability to manage health-related needs will improve 12/02/2023 2033 by Les Rao, RN Outcome: Progressing 12/02/2023 0646 by Les Rao, RN Outcome: Progressing Goal: Goals will be collaboratively established with patient/family 12/02/2023 2033 by Les Rao, RN Outcome: Progressing 12/02/2023 0646 by Les Rao, RN Outcome: Progressing   Problem: Self-Care: Goal: Ability to participate in self-care as condition permits will improve 12/02/2023 2033 by Les Rao, RN Outcome: Progressing 12/02/2023 0646 by Les Rao, RN Outcome: Progressing Goal: Verbalization of feelings and concerns  over difficulty with self-care will improve 12/02/2023 2033 by Les Rao, RN Outcome: Progressing 12/02/2023 0646 by Les Rao, RN Outcome: Progressing Goal: Ability to communicate needs accurately will improve 12/02/2023 2033 by Les Rao, RN Outcome: Progressing 12/02/2023 0646 by Les Rao, RN Outcome: Progressing   Problem: Nutrition: Goal: Risk of aspiration will decrease 12/02/2023 2033 by Les Rao, RN Outcome: Progressing 12/02/2023 0646 by Les Rao, RN Outcome: Progressing Goal: Dietary intake will improve 12/02/2023 2033 by Les Rao, RN Outcome: Progressing 12/02/2023 0646 by Les Rao, RN Outcome: Progressing   Problem: Education: Goal: Ability to describe self-care measures that may prevent or decrease complications (Diabetes Survival Skills Education) will improve 12/02/2023 2033 by Les Rao, RN Outcome: Progressing 12/02/2023 0646 by Les Rao, RN Outcome: Progressing Goal: Individualized Educational Video(s) 12/02/2023 2033 by Les Rao, RN Outcome: Progressing 12/02/2023 0646 by Les Rao, RN Outcome: Progressing   Problem: Coping: Goal: Ability to adjust to condition or change in health will improve 12/02/2023 2033 by Les Rao, RN Outcome: Progressing 12/02/2023 0646 by Les Rao, RN Outcome: Progressing   Problem: Fluid Volume: Goal: Ability to maintain a balanced intake and output will improve 12/02/2023 2033 by Les Rao, RN Outcome: Progressing 12/02/2023 0646 by Les Rao, RN Outcome: Progressing   Problem: Health Behavior/Discharge Planning: Goal: Ability to identify and utilize available resources and services will improve 12/02/2023 2033 by Les Rao, RN Outcome: Progressing 12/02/2023 0646 by Les Rao, RN Outcome: Progressing Goal: Ability to manage health-related needs will improve 12/02/2023 2033 by  Les Rao, RN Outcome: Progressing 12/02/2023 0646 by Les Rao, RN Outcome: Progressing   Problem: Metabolic: Goal: Ability to maintain appropriate glucose levels will improve 12/02/2023 2033 by Les Rao, RN Outcome: Progressing 12/02/2023 0646 by Les Rao, RN Outcome: Progressing  Problem: Nutritional: Goal: Maintenance of adequate nutrition will improve 12/02/2023 2033 by Les Rao, RN Outcome: Progressing 12/02/2023 0646 by Les Rao, RN Outcome: Progressing Goal: Progress toward achieving an optimal weight will improve 12/02/2023 2033 by Les Rao, RN Outcome: Progressing 12/02/2023 0646 by Les Rao, RN Outcome: Progressing   Problem: Skin Integrity: Goal: Risk for impaired skin integrity will decrease 12/02/2023 2033 by Les Rao, RN Outcome: Progressing 12/02/2023 0646 by Les Rao, RN Outcome: Progressing   Problem: Tissue Perfusion: Goal: Adequacy of tissue perfusion will improve 12/02/2023 2033 by Les Rao, RN Outcome: Progressing 12/02/2023 0646 by Les Rao, RN Outcome: Progressing   Problem: Education: Goal: Knowledge of General Education information will improve Description: Including pain rating scale, medication(s)/side effects and non-pharmacologic comfort measures 12/02/2023 2033 by Les Rao, RN Outcome: Progressing 12/02/2023 0646 by Les Rao, RN Outcome: Progressing   Problem: Health Behavior/Discharge Planning: Goal: Ability to manage health-related needs will improve 12/02/2023 2033 by Les Rao, RN Outcome: Progressing 12/02/2023 0646 by Les Rao, RN Outcome: Progressing   Problem: Clinical Measurements: Goal: Ability to maintain clinical measurements within normal limits will improve 12/02/2023 2033 by Les Rao, RN Outcome: Progressing 12/02/2023 0646 by Les Rao, RN Outcome: Progressing Goal: Will  remain free from infection 12/02/2023 2033 by Les Rao, RN Outcome: Progressing 12/02/2023 0646 by Les Rao, RN Outcome: Progressing Goal: Diagnostic test results will improve 12/02/2023 2033 by Les Rao, RN Outcome: Progressing 12/02/2023 0646 by Les Rao, RN Outcome: Progressing Goal: Respiratory complications will improve 12/02/2023 2033 by Les Rao, RN Outcome: Progressing 12/02/2023 0646 by Les Rao, RN Outcome: Progressing Goal: Cardiovascular complication will be avoided 12/02/2023 2033 by Les Rao, RN Outcome: Progressing 12/02/2023 0646 by Les Rao, RN Outcome: Progressing   Problem: Activity: Goal: Risk for activity intolerance will decrease 12/02/2023 2033 by Les Rao, RN Outcome: Progressing 12/02/2023 0646 by Les Rao, RN Outcome: Progressing   Problem: Nutrition: Goal: Adequate nutrition will be maintained 12/02/2023 2033 by Les Rao, RN Outcome: Progressing 12/02/2023 0646 by Les Rao, RN Outcome: Progressing   Problem: Coping: Goal: Level of anxiety will decrease 12/02/2023 2033 by Les Rao, RN Outcome: Progressing 12/02/2023 0646 by Les Rao, RN Outcome: Progressing   Problem: Elimination: Goal: Will not experience complications related to bowel motility 12/02/2023 2033 by Les Rao, RN Outcome: Progressing 12/02/2023 0646 by Les Rao, RN Outcome: Progressing Goal: Will not experience complications related to urinary retention 12/02/2023 2033 by Les Rao, RN Outcome: Progressing 12/02/2023 0646 by Les Rao, RN Outcome: Progressing   Problem: Pain Managment: Goal: General experience of comfort will improve and/or be controlled 12/02/2023 2033 by Les Rao, RN Outcome: Progressing 12/02/2023 0646 by Les Rao, RN Outcome: Progressing   Problem: Safety: Goal: Ability to remain free from  injury will improve 12/02/2023 2033 by Les Rao, RN Outcome: Progressing 12/02/2023 0646 by Les Rao, RN Outcome: Progressing   Problem: Skin Integrity: Goal: Risk for impaired skin integrity will decrease 12/02/2023 2033 by Les Rao, RN Outcome: Progressing 12/02/2023 0646 by Les Rao, RN Outcome: Progressing   Problem: Education: Goal: 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 12/02/2023 2033 by Les Rao, RN Outcome: Progressing 12/02/2023 0646 by Les Rao, RN Outcome: Progressing   Problem: Activity: Goal: Ability to tolerate increased activity will improve 12/02/2023 2033  by Les Rao, RN Outcome: Progressing 12/02/2023 0646 by Les Rao, RN Outcome: Progressing   Problem: Health Behavior/Discharge Planning: Goal: 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

## 2023-12-02 NOTE — Progress Notes (Signed)
       Overnight   NAME: Garrett [name] MRN: [id number] DOB : 02/06/1976    Date of Service   12/02/2023   HPI/Events of Note    Notified by RN for increasing difficulty with Cath and bleeding with repeated I&Os.   Interventions/ Plan   Foley for Difficulty and bleeding with continual I&O caths Follow all Attending/ Consult orders      Denece Finger BSN MSNA MSN ACNPC-AG Acute Care Nurse Practitioner [facility]

## 2023-12-02 NOTE — Hospital Course (Addendum)
 Garrett [name] is a 48 y.o. male with PMH significant for uncontrolled hypertension not on meds, smoking, marijuana use. October 27, 2023, patient was brought to the ED by EMS after he was found down with agonal breathing by his fiance, unknown downtime. EMS noted him with dried emesis over his face.   Bagging and brought him to the hospital. Initial blood pressure 262/156, emergently intubated UDS was positive for opioids, he was given several doses of Narcan  without improvement   CT head revealed an acute 4.1 cm ICH centered in the pons with intraventricular extension into the fourth ventricle and also extension into the basal cisterns. Basal cisterns and fourth ventricle are effaced without hydrocephalus. Trace additional subarachnoid hemorrhage was noted along the left parietal convexity    Was initially admitted to neuro ICU. Started on Cleviprex  drip.  Also given hypertonic saline.  Repeat CT head did not show any change. Neurology discussed with family and gave an expectation of poor prognosis. Palliative care was consulted. In the next few days, patient opened eyes and was able to follow commands but only with his eyes.  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 3/22, tracheostomy was placed.  3/24, PEG tube was placed. Patient could not be discharged because of Medicaid pending status.   Hospital course was prolonged because of failed weaning trial.  Course was complicated by recurrent ileus, episodes of infection/colonization of respiratory tract with MRSA, repeated transferred to ICU, trach and PEG dislodgment.   Patient had prolonged difficulty weaning from vent.  Patient was on trach collar for few days but later required ventilatory support mostly at nights and is  currently in ICU.  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 He was intubated in the ER Week of March 9 - March 15. Pt started on Cleviprex .  Repeat CTA showed no change in ICH of pons and similar biparietal SAH.  Neurology told family that his hemorrhage is nonsurvivable. On 10-29-2023, Pt started on hypertonic 3% saline due to cytotoxic edema and mass effect on brainstem. Palliative care consulted. Family did not want trach or PEG. Code status changed to DNR on 11-01-2023. Hypertonic saline stopped on 11-02-2023. On 11-02-2023, first noted that pt appears locked in. Pt can follow commands with his eyes. Week of March 16 - March 22. 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 Pt has some purposeful movement of left upper extremity. 11-08-2023 pt's mother and pt's fiance now want pt to have tracheostomy despite their initial statements that pt would never want a trach. 11-10-2023 pt has tracheostomy placed.  Week of March 23 - March 29. 11-12-2023 pt has PEG placed. Trach sputum cx grows MRSA.  Started on IV Vanco.  11-14-2023 pt able to follow commands using his left arm, left leg and can move his eyes up and down. Can withdraw with right leg. No movement in right arm. LTAC referral made. Abd XR consistent with ileus. Had BM after enemas. More obtunded and following less commands Week of March 30 - April 5. Tolerating CPAP trial with pressure support 15 cm Inspiratory, 5 cm expiratory. Has another ileus. Neurology signs off case on 11-19-2023. Pt's mother with unrealistic expectations that pt will walk again. Week of April 6 -  April 12. Failed trach collar trials due to thick secretions. Back on mechanical ventilation. 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 Trach sputum cx growing MRSA again. Treated with zyvox . Trach collar trials again. Trial of Provigil  to improve  alertness. Week of April 13 - April 19. Transferred to hospitalist service.  Still has #8 cuffed Shiley XLT  Week of April 20 - April 26. Trached changed to cuffless trach #8 Shiley XLT. Medicaid approved. Still awaiting for disability insurance. Week of April 27 - May 3. IV fentanyl  stopped on 12-19-2023. Mental status improved off opiates. Following simple commands to grimace, move left toes, track vertically with right eye. Tracheostomy tube downsized to cuffless #6 Shiley XLT proximal Week of May 4 - May 10. Provigil  on hold. PEG became dislodged. IR consulted to replace PEG. Week of May 11 - May 17. Unable to speak or produce any sounds with Passey-Muir valve. Not a candidate for decannulation given difficulty with phonation and inability to protect airway. Using communication yes no board. Still no disability insurance. Pt develops fevers. Urine cx grows E. Coli. Pt treated with IV Rocephin  for 5 days. Week of May 18 - 24. No major changes this week. Week of May 25 - May 31. Mother updated. No major changes Week of June 1 - June 7. Pt develops tachycardia and low grade fevers. Empirically started on cefepime /vanco. Trach sputum cx grows MRSA.  Week of June 8 - June 14.  Trach changed to #4 cuffless Shiley. On 01-30-2024. RT has difficulty passing suction catheter through #4 Shiley. Emergent bedside bronch showed distal end of tracheostomy tube was hitting tracheal wall with ulceration, edema and mucosal thickening. PCCM unable to re-insert trach. Pt decannulated.  Foley changed out. Week of June 15 - June 21. Family meeting with fiance and pt's mother. Family decided pt is NOT same to return to their home. Week of June 22 - June 28. Disability insurance still pending. PCCM called on 02-15-2024 due to desaturations. NT suctioned. Week of June 29 - July 5. Started on vanco/cefepime  due to hypoxia and increased secretions. PCCM re-engaged. Trach cx grew MRSA and klebsiella. Week of July 6 - July 12.  PCCM decides that pt needs to have trach replaced for secretion management. Palliative care discusses with pt's parents. Parents want to exclude pt's significant other [name] from decision making. 02-25-2024. PCCM called emergently to bedside due to respiratory distress. Pt moved to ICU and intubated. ENT consulted for redo tracheostomy. Foley exchanged. On 02-28-2024, family reverses code status. Pt changed to FULL CODE. Week of July 13 - July 19. ENT performs redo tracheostomy. #6 cuffed Shiley. PCCM recommends that pt NEVER be decannulated.  Week of July 20 - July 26. On trach collar trials. No major changes. Pt remains on ICU. Week of July 27 - August 2. PCCM feels that pt's respiratory tract is colonized and to only repeat respiratory cultures if secretions increase, develops leukocytosis, fever, etc(signs of systemic illness). Tolerating trach collar for most of the day and night. But by 6 am the next day, his work of breathing becomes labored and needs to go back on mechanical ventilation. Week of August 3 - August 9. Trach collar trials to see if he last 48 hours on just trach collar. Pt fails to last 48 hours on trach collar only. Plans made to use trach collar during day and mechanical ventilation at night. Pt's care transferred to hospitalist service on 03-27-2024. Pt to remains in ICU due to need for  nighttime mechanical ventilation. PICC line removed.  Week of August 10 - August 16. Pt lasts 24 hours on trach collar only without nighttime mechanical ventilation. Ophthalmology performs left temporary tarsorrhaphy. Tracheal aspirate and urine cx shows Pseudomonas. Started on IV cefepime  for 14 days. Week of August 17 - August 23. Evening of 04-09-2024, pt desats to 30%. PCCM re-engaged. Changed to cuffless trach #6 Shliey. Pt transferred back to ICU and placed back on mechanical ventilation.  Week of August 24 - August 30. On 04-14-2024, ophthalmology removes left eye tarsorrhaphy. PCCM decides on trach  collar during day and mechanical ventilation at night. Pt transfers back to hospitalist service. Week of August 31 - September 6. Continue trach collar during day and mechanical ventilation at night. Pt develops hypercapnia when left on extended trach collar. Pt should never be decannulated as he has already failed trach decannulation. Still waiting for disability insurance. Week of September 7 - September 13. No major issues this week.4 Week of September 14 - September 20. Still awaiting disability insurance. Continues on trach collar during daytime and nighttime mechanical ventilation. Pt has low grade fevers. [name] changed out 05-08-2024 Week of September 21 - September 27. Fevers resolved. No abx started. Week of September 28 - October 4. No major changes. Week of October 5 - October 7. PCCM reengaged due to tachypnea and increased work of breathing.   Procedures: 10-27-2023 endotracheal intubation 11-10-2023 bedside tracheostomy by PCCM 11-12-2023 bedside PEG by general surgery 01-30-2024 pt tracheostomy decannulated due to inability to replace trach. 02-25-2024 intubated for respiratory distress. 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  Blood  pressure stabilized continue metoprolol  tartrate 75 mg bid, amlodipine  10 and PRNs   Acute on chronic resp failure w hypoxia and hypercapnea  Trach dependence-nocturnal ventilator dependence MRSA, Pseudomonas with trach Decannulation preparatory due to ineffective airway clearance Chronic hypoventilation: S/p tracheostomy redo 7/16.  Not a candidate for decannulation given ineffective airway clearance. Continue trach collar during day with vent support at night.He is on 8 lit of oxygen via trach collar.  Continue trach care per protocol. Trach replaced last on 05/23/24. 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 Waylan evaluated and placed tarsorraphy.   8/29-ophtho rec: Erythromycin  eyedrop q2h, tape eyes with paper tape if not blinking. 9/14, ophtho, Dr. Leane reengaged due to worsening conjunctivitis and clouding recommended second antibiotic, gatifloxacin  4 times daily.per Optho, no epithelial defects noted. 9/25: courses completed of abx drops; continue on opth ointment and cover left eye with patch and/or tape to keep lid closed  9/29, more injected with discharge again in OS, given 5 day course of Polytrim  eyedrops to left eye.  Overall remains stable-left eye Covered.   Hypernatremia Resolved. Cont TF w/ free water  RD following, On 150 cc q 6 hr. Intermittently monitor labs  Elevated liver enzymes:  Mild.  Unclear etiology.  CK normal, checking intermittently   Inadequate oral intake Estimated Body mass index is 31.17 kg/m.: PEG replaced by IR on 05/15/24 after becoming dislodged again RD is following, continue tube feeding and supplements  Goals of care: Remains full code but prognosis is not bright and appears very poor, continue to provide supportive care.  Remains high risk for decompensation and continue to manage in ICU Palliative care involved on few occasions-however at this  time patient remains full code with full scope of treatment and palliative care signed off  Mobility: PT Orders:  PT Follow up Rec: Recommend Snf, Barriers To Snf Placement - Toc To F/U With Patient/Family For D/C Plans8/13/2025 1300   DVT prophylaxis: enoxaparin  (LOVENOX ) injection 40 mg Start: 03/06/24 1000 SCDs Start: 10/27/23 2226 Code Status:   Code Status: Full Code Family Communication: plan of care discussed with patient at bedside. Patient status is: Remains hospitalized because of severity of illness Level of care: ICU   Dispo: The patient is from: Home            Anticipated disposition: TBD Objective: Vitals last 24 hrs: Vitals:   06/02/24 0716 06/02/24 0800 06/02/24 0820 06/02/24 0900  BP:  101/76  128/88  Pulse:  67 74 80  Resp:  12 20 20   Temp: (!) 97 F (36.1 C)     TempSrc: Axillary     SpO2:  99% 97% 91%  Weight:      Height:        Physical Examination: General exam: Chronically ill looking,  HEENT:Oral mucosa moist, Ear/Nose WNL grossly  Respiratory system: Bilaterally diminished at the base, cuffed trach in place  Cardiovascular system: S1 & S2 +, No JVD. Gastrointestinal system: Abdomen soft,NT,ND, BS+ peg+ Nervous System: Intermittent tracking, eyes open intermittent tracking but does not follow commands, with right facial twitches Extremities: extremities warm, leg edema neg. Skin: No rashes,no icterus. MSK: weak  Medications reviewed:  Scheduled Meds:  amLODipine   10 mg Per Tube Daily   artificial tears   Both Eyes BID   Chlorhexidine  Gluconate Cloth  6 each Topical Daily   enoxaparin  (LOVENOX ) injection  40 mg Subcutaneous Daily   famotidine   20 mg Per Tube BID   feeding supplement (JEVITY 1.5 CAL/FIBER)  237 mL Per Tube 5 X Daily   feeding supplement (PROSource TF20)  60 mL Per Tube TID   free water   150 mL Per Tube Q6H   insulin  aspart  0-9 Units Subcutaneous Q6H   metoprolol  tartrate  75 mg Per Tube BID   multivitamin with minerals  1  tablet Per Tube Daily   mouth rinse  15 mL Mouth Rinse Q2H   polyethylene glycol  17 g Per Tube Daily   Continuous Infusions: Diet: Diet Order             Diet NPO time specified  Diet effective now

## 2023-12-03 DIAGNOSIS — I161 Hypertensive emergency: Secondary | ICD-10-CM

## 2023-12-03 DIAGNOSIS — J15212 Pneumonia due to Methicillin resistant Staphylococcus aureus: Secondary | ICD-10-CM

## 2023-12-03 DIAGNOSIS — I613 Nontraumatic intracerebral hemorrhage in brain stem: Secondary | ICD-10-CM | POA: Diagnosis not present

## 2023-12-03 DIAGNOSIS — J690 Pneumonitis due to inhalation of food and vomit: Secondary | ICD-10-CM | POA: Diagnosis not present

## 2023-12-03 DIAGNOSIS — D75839 Thrombocytosis, unspecified: Secondary | ICD-10-CM | POA: Diagnosis not present

## 2023-12-03 LAB — GLUCOSE, CAPILLARY
Glucose-Capillary: 85 mg/dL (ref 70–99)
Glucose-Capillary: 133 mg/dL — ABNORMAL HIGH (ref 70–99)
Glucose-Capillary: 106 mg/dL — ABNORMAL HIGH (ref 70–99)
Glucose-Capillary: 147 mg/dL — ABNORMAL HIGH (ref 70–99)
Glucose-Capillary: 129 mg/dL — ABNORMAL HIGH (ref 70–99)
Glucose-Capillary: 99 mg/dL (ref 70–99)
Glucose-Capillary: 114 mg/dL — ABNORMAL HIGH (ref 70–99)

## 2023-12-03 NOTE — Progress Notes (Addendum)
 Physical Therapy Treatment Patient Details Name: Garrett [name] MRN: [id number] DOB: 09-Jan-1976 Today's Date: 12/03/2023   History of Present Illness Pt is a 48 yo male who was found down 10/27/23 with agonal breathing. 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 Intubated 3/8, cortrak placed 3/14, trach placed 3/22, PEG placed 3/24. PMH: HTN, smoker    PT Comments  Pt with regression towards his physical therapy goals this date; no command following, which is a change from treatment session on 4/10. Pt was aroused from sleep, which may have affected assessment. Performed ROM to RUE and BLE's. Pt requiring totalA for placement of maxi move lift pad and transferred over to chair to promote upright positioning and to improve respiratory status. Overall he tolerated well, with VSS on 28% FiO2, 6 PEEP via trach. Placed chair in front of bed rail and with hand over hand guidance, placed BUE's on rail and assisted trunk to work on anterior weight shift, however, pt with no initiation noted. Will continue to progress as tolerated.    If plan is discharge home, recommend the following: Two people to help with walking and/or transfers;Two people to help with bathing/dressing/bathroom;Assistance with cooking/housework;Assistance with feeding;Direct supervision/assist for medications management;Direct supervision/assist for financial management;Assist for transportation;Help with stairs or ramp for entrance;Supervision due to cognitive status   Can travel by private vehicle        Equipment Recommendations  [place] lift;Hospital bed;Wheelchair (measurements PT);Wheelchair cushion (measurements PT);BSC/3in1    Recommendations for Other Services       Precautions / Restrictions Precautions Precautions: Fall;Other (comment) Recall of  Precautions/Restrictions: Impaired Restrictions Weight Bearing Restrictions Per Provider Order: No     Mobility  Bed Mobility Overal bed mobility: Needs Assistance Bed Mobility: Rolling Rolling: Total assist         General bed mobility comments: TotalA for rolling to R/L for placement of [place] lift pad    Transfers Overall transfer level: Needs assistance   Transfers: Bed to chair/wheelchair/BSC             General transfer comment: [place] bed to chair Transfer via Lift Equipment: [place]  Ambulation/Gait                   Stairs             Wheelchair Mobility     Tilt Bed    Modified Rankin (Stroke Patients Only) Modified Rankin (Stroke Patients Only) Pre-Morbid Rankin Score: No symptoms Modified Rankin: Severe disability     Balance                                            Communication Communication Communication: Impaired Factors Affecting Communication: Trach/intubated  Cognition Arousal: Alert Behavior During Therapy: Flat affect   PT - Cognitive impairments: Difficult to assess Difficult to assess due to: Tracheostomy                     PT - Cognition Comments: Pt alert after aroused with verbal stimulation, no command following this date Following commands: Impaired      Cueing    Exercises General Exercises - Lower Extremity Ankle Circles/Pumps: PROM, Both, 10 reps, Supine Heel Slides: PROM, Both, 10 reps, Supine Hip ABduction/ADduction: PROM,  Both, 10 reps, Supine Other Exercises Other Exercises: Supine: RUE D1/D2 x 10 each PROM    General Comments        Pertinent Vitals/Pain Pain Assessment Pain Assessment: Faces Faces Pain Scale: No hurt    Home Living                          Prior Function            PT Goals (current goals can now be found in the care plan section) Acute Rehab PT Goals Patient Stated Goal: unable to state Potential to Achieve  Goals: Fair Progress towards PT goals: Not progressing toward goals - comment    Frequency    Min 2X/week      PT Plan      Co-evaluation              AM-PAC PT "6 Clicks" Mobility   Outcome Measure  Help needed turning from your back to your side while in a flat bed without using bedrails?: Total Help needed moving from lying on your back to sitting on the side of a flat bed without using bedrails?: Total Help needed moving to and from a bed to a chair (including a wheelchair)?: Total Help needed standing up from a chair using your arms (e.g., wheelchair or bedside chair)?: Total Help needed to walk in hospital room?: Total Help needed climbing 3-5 steps with a railing? : Total 6 Click Score: 6    End of Session Equipment Utilized During Treatment: Oxygen Activity Tolerance: Patient tolerated treatment well Patient left: in chair;with call bell/phone within reach;with chair alarm set Nurse Communication: Mobility status PT Visit Diagnosis: Muscle weakness (generalized) (M62.81);Difficulty in walking, not elsewhere classified (R26.2);Other symptoms and signs involving the nervous system (R29.898);Hemiplegia and hemiparesis Hemiplegia - Right/Left: Right Hemiplegia - dominant/non-dominant: Dominant Hemiplegia - caused by: Nontraumatic intracerebral hemorrhage     Time: 0817-0902 PT Time Calculation (min) (ACUTE ONLY): 45 min  Charges:    $Therapeutic Exercise: 8-22 mins $Therapeutic Activity: 23-37 mins PT General Charges $$ ACUTE PT VISIT: 1 Visit                     Verdia Glad, PT, DPT Acute Rehabilitation Services Office [phone]    Claria Crofts 12/03/2023, 9:35 AM

## 2023-12-03 NOTE — Plan of Care (Signed)
  Problem: Fluid Volume: Goal: Ability to maintain a balanced intake and output will improve Outcome: Progressing   Problem: Activity: Goal: Risk for activity intolerance will decrease Outcome: Progressing   Problem: Coping: Goal: Level of anxiety will decrease Outcome: Progressing   Problem: Elimination: Goal: Will not experience complications related to bowel motility Outcome: Progressing   Problem: Pain Managment: Goal: General experience of comfort will improve and/or be controlled Outcome: Progressing   Problem: Safety: Goal: Ability to remain free from injury will improve Outcome: Progressing

## 2023-12-03 NOTE — Progress Notes (Signed)
 PROGRESS NOTE    Garrett [name]  YQM:[id number] DOB: [birth year] DOA: 10/27/2023 PCP: Pcp, No   Brief Narrative: Garrett [name] is a 48 y.o. male with a history of hypertension.  Patient presented secondary to being found down with agonal bleeding by his fiancee. 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 The hemorrhage was initially assessed with a CT scan, revealing a 4.1 cm acute lesion, and was followed up with repeat imaging showing a decrease in size over time, although subarachnoid hemorrhage was also noted on subsequent scans. The patient was managed for primary hypertension and hypertensive emergency, with Cleviprex initiated in the ICU, which was later weaned off. Due to dysphagia, a PEG tube was placed on 3/24. The patient also experienced acute respiratory failure secondary to aspiration pneumonia, requiring intubation on 3/8 and subsequent tracheostomy on 3/22. MRSA pneumonia was identified in tracheal aspirates, and the patient was initially treated with Unasyn, later switched to Vancomycin, and transitioned to Linezolid to complete the course. The patient's ileus resolved, and no new intracranial abnormalities were noted after the subacute phase of the hemorrhage. Tracheostomy care was provided by pulmonology, and Mikael Spray was continued for respiratory management.   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 Code stroke CT head significant for an acute large 4.1 cm hemorrhage centered in the pons with intraventricular extension into the fourth ventricle with extension into the basal cisterns. Repeat CT (3/9) without significant change and repeat CT (3/18) significant for interval decreased size of subacute pontine hemorrhage. CT head (3/29) significant for evolving  subacute pontine hemorrhage with subarachnoid hemorrhage and no new intracranial abnormality. Transthoracic Echocardiogram significant for moderate concentric LVH with an LVEF of 60-65%. LDL of 96. Hemoglobin A1C of 4.7%. No antithrombotic secondary to intracerebral hemorrhage.  Primary hypertension Hypertensive emergency Patient initial managed with Cleviprex while in ICU which was weaned off. -Continue amlodipine and metoprolol  Dysphagia PEG tube placed on 3/24. Patient seen by speech therapy. -Continue tube feeds  Ileus Resolved.  Acute respiratory failure with hypoxia S/p tracheostomy Secondary to aspiration pneumonia. Patient required intubation on 3/8 and managed via mechanical ventilation. Patient converted to tracheostomy on 3/22. -Tracheostomy care per pulmonology -Continue Yupelri  Aspiration pneumonia MRSA pneumonia Patient treated empirically with Unasyn. Tracheal aspirate (4/1) significant for MRSA. Patient treated with Vancomycin with prolonged treatment for increased secretions and persistently positive tracheal aspirate (4/8). Patient transitioned to Linezolid to complete treatment. -Continue Linezolid  Macrocytic anemia Stable.  Thrombocytosis Resolved.  Poor dention Noted.  AKI Resolved.  Acute urinary retention Foley catheter placed on 4/13.  Hypernatremia Resolved with fluids.  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 Patient seen by ophthalmology. -Ophthalmology recommendations (4/7): maxitrol ointment TID x1 week followed by Refresh PM TID-QID for chronic lubrication; can tape eyelid closed if exposure is still an issue (plastic tape rather than paper tape and tape eye directly rather than with gauze)  Pressure injury Medial perineum. Not present on admission.   DVT prophylaxis: Lovenox Code Status:   Code Status: Do not attempt resuscitation (DNR) PRE-ARREST INTERVENTIONS DESIRED Family Communication: None at bedside Disposition Plan: Discharge  eventually to SNF when medically stable   Consultants:  PCCM Ophthalmology  Procedures:    Antimicrobials:     Subjective: Patient is nonverbal  Objective: BP (!) 138/96 (BP Location: Left Arm)   Pulse 89   Temp  98.7 F (37.1 C) (Axillary)   Resp 20   Ht 5\' 8"  (1.727 m)   Wt 100.8 kg   SpO2 96%   BMI 33.79 kg/m   Examination:  General exam: Appears calm and comfortable Respiratory system: Clear to auscultation. Respiratory effort normal. Cardiovascular system: S1 & S2 heard, RRR. Gastrointestinal system: Abdomen is nondistended, soft and nontender. Normal bowel sounds heard. Central nervous system: Asleep but awakened when I started examining him. Moves arms spontaneously.    Data Reviewed: I have personally reviewed following labs and imaging studies  CBC Lab Results  Component Value Date   WBC 7.6 12/01/2023   RBC 2.84 (L) 12/01/2023   HGB 8.8 (L) 12/01/2023   HCT 28.4 (L) 12/01/2023   MCV 100.0 12/01/2023   MCH 31.0 12/01/2023   PLT 394 12/01/2023   MCHC 31.0 12/01/2023   RDW 14.0 12/01/2023   LYMPHSABS 2.6 11/20/2023   MONOABS 1.4 (H) 11/20/2023   EOSABS 0.1 11/20/2023   BASOSABS 0.0 11/20/2023     Last metabolic panel Lab Results  Component Value Date   NA 138 12/01/2023   K 3.9 12/01/2023   CL 103 12/01/2023   CO2 26 12/01/2023   BUN 19 12/01/2023   CREATININE 0.89 12/01/2023   GLUCOSE 109 (H) 12/01/2023   GFRNONAA >60 12/01/2023   GFRAA >60 01/15/2018   CALCIUM 9.1 12/01/2023   PHOS 3.6 12/01/2023   PROT 5.6 (L) 10/27/2023   ALBUMIN 2.8 (L) 10/27/2023   BILITOT 0.4 10/27/2023   ALKPHOS 53 10/27/2023   AST 39 10/27/2023   ALT 17 10/27/2023   ANIONGAP 9 12/01/2023    GFR: Estimated Creatinine Clearance: 118.1 mL/min (by C-G formula based on SCr of 0.89 mg/dL).  Recent Results (from the past 240 hours)  Culture, Respiratory w Gram Stain     Status: None   Collection Time: 11/27/23  7:22 AM   Specimen: Tracheal Aspirate;  Respiratory  Result Value Ref Range Status   Specimen Description TRACHEAL ASPIRATE  Final   Special Requests NONE  Final   Gram Stain   Final    MODERATE WBC PRESENT, PREDOMINANTLY PMN FEW GRAM POSITIVE COCCI IN CLUSTERS FEW GRAM NEGATIVE RODS FEW GRAM POSITIVE RODS Performed at [facility], [address], [place], [name] [address]    Culture   Final    MODERATE METHICILLIN RESISTANT STAPHYLOCOCCUS AUREUS   Report Status 11/29/2023 FINAL  Final   Organism ID, Bacteria METHICILLIN RESISTANT STAPHYLOCOCCUS AUREUS  Final      Susceptibility   Methicillin resistant staphylococcus aureus - MIC*    CIPROFLOXACIN <=0.5 SENSITIVE Sensitive     ERYTHROMYCIN >=8 RESISTANT Resistant     GENTAMICIN <=0.5 SENSITIVE Sensitive     OXACILLIN >=4 RESISTANT Resistant     TETRACYCLINE >=16 RESISTANT Resistant     VANCOMYCIN 1 SENSITIVE Sensitive     TRIMETH/SULFA <=10 SENSITIVE Sensitive     CLINDAMYCIN <=0.25 SENSITIVE Sensitive     RIFAMPIN <=0.5 SENSITIVE Sensitive     Inducible Clindamycin NEGATIVE Sensitive     LINEZOLID 2 SENSITIVE Sensitive     * MODERATE METHICILLIN RESISTANT STAPHYLOCOCCUS AUREUS      Radiology Studies: No results found.    LOS: 37 days    Jacquelin Hawking, MD [facility] 12/03/2023, 4:54 PM   If 7PM-7AM, please contact night-coverage [email or web address]

## 2023-12-03 NOTE — TOC Progression Note (Addendum)
 Transition of Care [facility]) - Progression Note    Patient Details  Name: Garrett [name] MRN: [id number] Date of Birth: 1976-05-07  Transition of [facility]) CM/SW Contact  Juliane Och, LCSW Phone Number: 12/03/2023, 9:13 AM  Clinical Narrative:     9:14 AM Per chart review, patient is on 6L oxygen with a trach collar. CSW sent referrals to SNFs in [place] that could accommodate trach collar ([facility]) and [facility]). CSW informed [place] [facility] SN[facility] of referral who could consider patient pending Medicaid.  Expected Discharge Plan: Long Term Nursing Home Barriers to Discharge: Continued Medical Work up, SNF Pending Medicaid, SNF Pending bed offer, SNF Pending payor source - LOG, Inadequate or no insurance (New trach)  Expected Discharge Plan and Services In-house Referral: Clinical Social Work, Hospice / Palliative Care   Post Acute Care Choice: Skilled Nursing Facility Living arrangements for the past 2 months: Single Family Home                                       Social Determinants of Health (SDOH) Interventions SDOH Screenings   Food Insecurity: Patient Unable To Answer (10/29/2023)  Social Connections: Unknown (06/23/2023)   Received from [facility]  Tobacco Use: High Risk (10/31/2023)    Readmission Risk Interventions     No data to display

## 2023-12-04 DIAGNOSIS — I613 Nontraumatic intracerebral hemorrhage in brain stem: Secondary | ICD-10-CM | POA: Diagnosis not present

## 2023-12-04 DIAGNOSIS — I161 Hypertensive emergency: Secondary | ICD-10-CM | POA: Diagnosis not present

## 2023-12-04 DIAGNOSIS — J690 Pneumonitis due to inhalation of food and vomit: Secondary | ICD-10-CM | POA: Diagnosis not present

## 2023-12-04 DIAGNOSIS — D75839 Thrombocytosis, unspecified: Secondary | ICD-10-CM | POA: Diagnosis not present

## 2023-12-04 DIAGNOSIS — J15212 Pneumonia due to Methicillin resistant Staphylococcus aureus: Secondary | ICD-10-CM | POA: Diagnosis not present

## 2023-12-04 LAB — BASIC METABOLIC PANEL WITH GFR
Anion gap: 9 (ref 5–15)
BUN: 19 mg/dL (ref 6–20)
CO2: 25 mmol/L (ref 22–32)
Calcium: 9.1 mg/dL (ref 8.9–10.3)
Chloride: 102 mmol/L (ref 98–111)
Creatinine, Ser: 0.96 mg/dL (ref 0.61–1.24)
GFR, Estimated: >60 mL/min (ref >60)
Glucose, Bld: 129 mg/dL — ABNORMAL HIGH (ref 70–99)
Potassium: 3.9 mmol/L (ref 3.5–5.1)
Sodium: 136 mmol/L (ref 135–145)

## 2023-12-04 LAB — CBC
HCT: 31.4 % — ABNORMAL LOW (ref 39.0–52.0)
Hemoglobin: 9.8 g/dL — ABNORMAL LOW (ref 13.0–17.0)
MCH: 30.8 pg (ref 26.0–34.0)
MCHC: 31.2 g/dL (ref 30.0–36.0)
MCV: 98.7 fL (ref 80.0–100.0)
Platelets: 388 10*3/uL (ref 150–400)
RBC: 3.18 MIL/uL — ABNORMAL LOW (ref 4.22–5.81)
RDW: 14.3 % (ref 11.5–15.5)
WBC: 8.5 10*3/uL (ref 4.0–10.5)
nRBC: 0 % (ref 0.0–0.2)

## 2023-12-04 LAB — GLUCOSE, CAPILLARY
Glucose-Capillary: 96 mg/dL (ref 70–99)
Glucose-Capillary: 164 mg/dL — ABNORMAL HIGH (ref 70–99)
Glucose-Capillary: 105 mg/dL — ABNORMAL HIGH (ref 70–99)
Glucose-Capillary: 106 mg/dL — ABNORMAL HIGH (ref 70–99)
Glucose-Capillary: 119 mg/dL — ABNORMAL HIGH (ref 70–99)
Glucose-Capillary: 132 mg/dL — ABNORMAL HIGH (ref 70–99)

## 2023-12-04 NOTE — Progress Notes (Signed)
 PROGRESS NOTE    Garrett [name]  ZOX:[id number] DOB: 1976/02/22 DOA: 10/27/2023 PCP: Pcp, No   Brief Narrative: Garrett [name] is a 48 y.o. male with a history of hypertension.  Patient presented secondary to being found down with agonal bleeding by his fiancee. 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 The hemorrhage was initially assessed with a CT scan, revealing a 4.1 cm acute lesion, and was followed up with repeat imaging showing a decrease in size over time, although subarachnoid hemorrhage was also noted on subsequent scans. The patient was managed for primary hypertension and hypertensive emergency, with Cleviprex initiated in the ICU, which was later weaned off. Due to dysphagia, a PEG tube was placed on 3/24. The patient also experienced acute respiratory failure secondary to aspiration pneumonia, requiring intubation on 3/8 and subsequent tracheostomy on 3/22. MRSA pneumonia was identified in tracheal aspirates, and the patient was initially treated with Unasyn, later switched to Vancomycin, and transitioned to Linezolid to complete the course. The patient's ileus resolved, and no new intracranial abnormalities were noted after the subacute phase of the hemorrhage. Tracheostomy care was provided by pulmonology, and Mikael Spray was continued for respiratory management.   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 Code stroke CT head significant for an acute large 4.1 cm hemorrhage centered in the pons with intraventricular extension into the fourth ventricle with extension into the basal cisterns. Repeat CT (3/9) without significant change and repeat CT (3/18) significant for interval decreased size of subacute pontine hemorrhage. CT head (3/29) significant for evolving  subacute pontine hemorrhage with subarachnoid hemorrhage and no new intracranial abnormality. Transthoracic Echocardiogram significant for moderate concentric LVH with an LVEF of 60-65%. LDL of 96. Hemoglobin A1C of 4.7%. No antithrombotic secondary to intracerebral hemorrhage.  Primary hypertension Hypertensive emergency Patient initial managed with Cleviprex while in ICU which was weaned off. -Continue amlodipine and metoprolol  Dysphagia PEG tube placed on 3/24. Patient seen by speech therapy. -Continue tube feeds  Ileus Resolved.  Acute respiratory failure with hypoxia S/p tracheostomy Secondary to aspiration pneumonia. Patient required intubation on 3/8 and managed via mechanical ventilation. Patient converted to tracheostomy on 3/22. -Tracheostomy care per pulmonology -Continue Yupelri  Aspiration pneumonia MRSA pneumonia Patient treated empirically with Unasyn. Tracheal aspirate (4/1) significant for MRSA. Patient treated with Vancomycin with prolonged treatment for increased secretions and persistently positive tracheal aspirate (4/8). Patient transitioned to Linezolid to complete treatment. -Continue Linezolid  Macrocytic anemia Stable.  Thrombocytosis Resolved.  Poor dention Noted.  AKI Resolved.  Acute urinary retention Foley catheter placed on 4/13.  Hypernatremia Resolved with fluids.  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 Patient seen by ophthalmology. -Ophthalmology recommendations (4/7): maxitrol ointment TID x1 week followed by Refresh PM TID-QID for chronic lubrication; can tape eyelid closed if exposure is still an issue (plastic tape rather than paper tape and tape eye directly rather than with gauze)  Pressure injury Medial perineum. Not present on admission.   DVT prophylaxis: Lovenox Code Status:   Code Status: Do not attempt resuscitation (DNR) PRE-ARREST INTERVENTIONS DESIRED Family Communication: None at bedside Disposition Plan: Discharge  eventually to SNF when medically stable   Consultants:  PCCM Ophthalmology  Procedures:    Antimicrobials:     Subjective: Patient is nonverbal  Objective: BP (!) 148/92 (BP Location: Left Arm)   Pulse 71   Temp  98.7 F (37.1 C) (Axillary)   Resp (!) 22   Ht 5\' 8"  (1.727 m)   Wt 99.6 kg   SpO2 100%   BMI 33.37 kg/m   Examination:  General exam: Appears calm and comfortable Respiratory system: Clear to auscultation. Respiratory effort normal. Cardiovascular system: S1 & S2 heard, RRR. Gastrointestinal system: Abdomen is nondistended, soft and nontender. Normal bowel sounds heard. Central nervous system: Asleep but woke up spontaneously during my exam. Can not assess orientation. Moves arms spontaneously. Musculoskeletal: Trace-1+ pitting BLE edema.   Data Reviewed: I have personally reviewed following labs and imaging studies  CBC Lab Results  Component Value Date   WBC 7.6 12/01/2023   RBC 2.84 (L) 12/01/2023   HGB 8.8 (L) 12/01/2023   HCT 28.4 (L) 12/01/2023   MCV 100.0 12/01/2023   MCH 31.0 12/01/2023   PLT 394 12/01/2023   MCHC 31.0 12/01/2023   RDW 14.0 12/01/2023   LYMPHSABS 2.6 11/20/2023   MONOABS 1.4 (H) 11/20/2023   EOSABS 0.1 11/20/2023   BASOSABS 0.0 11/20/2023     Last metabolic panel Lab Results  Component Value Date   NA 138 12/01/2023   K 3.9 12/01/2023   CL 103 12/01/2023   CO2 26 12/01/2023   BUN 19 12/01/2023   CREATININE 0.89 12/01/2023   GLUCOSE 109 (H) 12/01/2023   GFRNONAA >60 12/01/2023   GFRAA >60 01/15/2018   CALCIUM 9.1 12/01/2023   PHOS 3.6 12/01/2023   PROT 5.6 (L) 10/27/2023   ALBUMIN 2.8 (L) 10/27/2023   BILITOT 0.4 10/27/2023   ALKPHOS 53 10/27/2023   AST 39 10/27/2023   ALT 17 10/27/2023   ANIONGAP 9 12/01/2023    GFR: Estimated Creatinine Clearance: 117.4 mL/min (by C-G formula based on SCr of 0.89 mg/dL).  Recent Results (from the past 240 hours)  Culture, Respiratory w Gram Stain     Status: None    Collection Time: 11/27/23  7:22 AM   Specimen: Tracheal Aspirate; Respiratory  Result Value Ref Range Status   Specimen Description TRACHEAL ASPIRATE  Final   Special Requests NONE  Final   Gram Stain   Final    MODERATE WBC PRESENT, PREDOMINANTLY PMN FEW GRAM POSITIVE COCCI IN CLUSTERS FEW GRAM NEGATIVE RODS FEW GRAM POSITIVE RODS Performed at [facility], [address], [place], [name] [address]    Culture   Final    MODERATE METHICILLIN RESISTANT STAPHYLOCOCCUS AUREUS   Report Status 11/29/2023 FINAL  Final   Organism ID, Bacteria METHICILLIN RESISTANT STAPHYLOCOCCUS AUREUS  Final      Susceptibility   Methicillin resistant staphylococcus aureus - MIC*    CIPROFLOXACIN <=0.5 SENSITIVE Sensitive     ERYTHROMYCIN >=8 RESISTANT Resistant     GENTAMICIN <=0.5 SENSITIVE Sensitive     OXACILLIN >=4 RESISTANT Resistant     TETRACYCLINE >=16 RESISTANT Resistant     VANCOMYCIN 1 SENSITIVE Sensitive     TRIMETH/SULFA <=10 SENSITIVE Sensitive     CLINDAMYCIN <=0.25 SENSITIVE Sensitive     RIFAMPIN <=0.5 SENSITIVE Sensitive     Inducible Clindamycin NEGATIVE Sensitive     LINEZOLID 2 SENSITIVE Sensitive     * MODERATE METHICILLIN RESISTANT STAPHYLOCOCCUS AUREUS      Radiology Studies: No results found.    LOS: 38 days    Jacquelin Hawking, MD [facility] 12/04/2023, 8:55 AM   If 7PM-7AM, please contact night-coverage [email or web address]

## 2023-12-04 NOTE — Plan of Care (Signed)
  Problem: Education: Goal: Knowledge of disease or condition will improve Outcome: Progressing Goal: Knowledge of secondary prevention will improve (MUST DOCUMENT ALL) Outcome: Progressing   Problem: Self-Care: Goal: Ability to participate in self-care as condition permits will improve Outcome: Progressing   Problem: Nutrition: Goal: Risk of aspiration will decrease Outcome: Progressing   Problem: Fluid Volume: Goal: Ability to maintain a balanced intake and output will improve Outcome: Progressing   Problem: Metabolic: Goal: Ability to maintain appropriate glucose levels will improve Outcome: Progressing   Problem: Education: Goal: 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 Outcome: Progressing   Problem: Activity: Goal: Ability to tolerate increased activity will improve Outcome: Progressing   Problem: Health Behavior/Discharge Planning: Goal: Ability to manage tracheostomy will improve Outcome: Progressing   Problem: Respiratory: Goal: Patent airway maintenance will improve Outcome: Progressing   Problem: Role Relationship: Goal: Ability to communicate will improve Outcome: Progressing

## 2023-12-05 DIAGNOSIS — A419 Sepsis, unspecified organism: Secondary | ICD-10-CM | POA: Diagnosis not present

## 2023-12-05 DIAGNOSIS — I619 Nontraumatic intracerebral hemorrhage, unspecified: Secondary | ICD-10-CM | POA: Diagnosis not present

## 2023-12-05 LAB — GLUCOSE, CAPILLARY
Glucose-Capillary: 82 mg/dL (ref 70–99)
Glucose-Capillary: 152 mg/dL — ABNORMAL HIGH (ref 70–99)
Glucose-Capillary: 129 mg/dL — ABNORMAL HIGH (ref 70–99)
Glucose-Capillary: 105 mg/dL — ABNORMAL HIGH (ref 70–99)
Glucose-Capillary: 106 mg/dL — ABNORMAL HIGH (ref 70–99)
Glucose-Capillary: 120 mg/dL — ABNORMAL HIGH (ref 70–99)

## 2023-12-05 MED ORDER — ORAL CARE MOUTH RINSE
15.0000 mL | OROMUCOSAL | Status: DC | PRN
  Administered 2024-02-15: 15 mL via OROMUCOSAL

## 2023-12-05 MED ORDER — ORAL CARE MOUTH RINSE
15.0000 mL | OROMUCOSAL | Status: DC
  Administered 2023-12-05 – 2024-02-25 (×309): 15 mL via OROMUCOSAL

## 2023-12-05 NOTE — Progress Notes (Signed)
 Nutrition Follow-up  DOCUMENTATION CODES:   Not applicable  INTERVENTION:  *** Continue tube feeds via PEG: - Osmolite 1.5 currently infusing at 30 mL/hr. Advance rate by 10 mL/hr every 6 hours to goal rate of 50 mL/hr (1200 mL/day) - PROSource TF20 60 mL TID - Adjust free water to 100 mL every 4 hours to decrease flush volume   Tube feeding regimen at goal rate provides 2040 kcal, 135 grams of protein, and 914 ml of H2O.   Total free water with flushes: 1314 mL   - Continue MVI with minerals daily per tube   NUTRITION DIAGNOSIS:   Inadequate oral intake related to inability to eat as evidenced by NPO status.  - Ongoing, being addressed with TF's  GOAL:   Patient will meet greater than or equal to 90% of their needs  - Meeting via TF's  MONITOR:   TF tolerance, I & O's  REASON FOR ASSESSMENT:   Consult Enteral/tube feeding initiation and management  ASSESSMENT:   Pt with PMH of HTN, daily mariajuana, and smoker admitted after being found down at home with pontine hemorrhagic stroke and trace SAH. UDS positive for opioids. Noted aspiration PNA on admission.  3/08 - admitted with [facility], intubated 3/09 - adult TF protocol started 3/14 - s/p cortrak placement, tip gastric  3/22 - s/p tracheostomy at bedside 3/24 - s/p PEG placement 3/25 - diuresed, removed 2.2 L, net -1.8 L 3/26 - emesis, TF paused 3/28 - trickle TF starated 3/31 - TF off due to ileus 4/02 - trickle TF started  4/04 - TF back at goal 4/08 - episode of vomiting x 2, tube feeds paused then resumed at trickle 4/11 - TF back at goal  ***  No longer requires vent?   Admit weight: *** Current weight: ***   Average Meal Intake: NPO  Nutritionally Relevant Medications: Scheduled Meds:  amLODipine  10 mg Per Tube Daily   artificial tears   Left Eye Q8H   Chlorhexidine Gluconate Cloth  6 each Topical Daily   enoxaparin (LOVENOX) injection  50 mg Subcutaneous Daily   famotidine  20 mg Per Tube  BID   feeding supplement (PROSource TF20)  60 mL Per Tube TID   free water  100 mL Per Tube Q6H   insulin aspart  0-15 Units Subcutaneous Q4H   metoprolol tartrate  100 mg Per Tube BID   modafinil  100 mg Per Tube Daily   multivitamin with minerals  1 tablet Per Tube Daily   mouth rinse  15 mL Mouth Rinse 4 times per day   revefenacin  175 mcg Nebulization Daily   Continuous Infusions:  feeding supplement (OSMOLITE 1.5 CAL) 1,000 mL (12/04/23 2224)   PRN Meds:.acetaminophen **OR** acetaminophen (TYLENOL) oral liquid 160 mg/5 mL **OR** acetaminophen, bisacodyl, fentaNYL (SUBLIMAZE) injection, ipratropium-albuterol, labetalol, mouth rinse, mouth rinse, polyethylene glycol, polyvinyl alcohol, prochlorperazine  Labs Reviewed: *** CBG ranges from ***-*** mg/dL over the last 24 hours HgbA1c ***   NUTRITION - FOCUSED PHYSICAL EXAM:  {RD Focused Exam List:21252}  Diet Order:   Diet Order             Diet NPO time specified  Diet effective now                   EDUCATION NEEDS:   No education needs have been identified at this time  Skin:  Skin Assessment: Skin Integrity Issues: Skin Integrity Issues:: Stage II Stage II: Stage 2, perineum  Last  BM:  11/28/23 type 7  Height:   Ht Readings from Last 1 Encounters:  11/22/23 5\' 8"  (1.727 m)    Weight:   Wt Readings from Last 1 Encounters:  12/04/23 99.6 kg    Ideal Body Weight:  70 kg  BMI:  Body mass index is 33.37 kg/m.  Estimated Nutritional Needs:   Kcal:  2000-2200  Protein:  125-140 grams  Fluid:  >2 L/day  Frederik Jansky, RD Registered Dietitian  See Amion for more information

## 2023-12-05 NOTE — Progress Notes (Signed)
 Progress Note   Patient: Garrett [name] ZOX:[id number] DOB: 1975/11/12 DOA: 10/27/2023     39 DOS: the patient was seen and examined on 12/05/2023   Brief hospital course: 48 y.o. male with a history of hypertension.  Patient presented secondary to being found down with agonal bleeding by his fiancee. 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 The hemorrhage was initially assessed with a CT scan, revealing a 4.1 cm acute lesion, and was followed up with repeat imaging showing a decrease in size over time, although subarachnoid hemorrhage was also noted on subsequent scans. The patient was managed for primary hypertension and hypertensive emergency, with Cleviprex initiated in the ICU, which was later weaned off. Due to dysphagia, a PEG tube was placed on 3/24. The patient also experienced acute respiratory failure secondary to aspiration pneumonia, requiring intubation on 3/8 and subsequent tracheostomy on 3/22. MRSA pneumonia was identified in tracheal aspirates, and the patient was initially treated with Unasyn, later switched to Vancomycin, and transitioned to Linezolid to complete the course. The patient's ileus resolved, and no new intracranial abnormalities were noted after the subacute phase of the hemorrhage. Tracheostomy care was provided by pulmonology, and Mikael Spray was continued for respiratory management.    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 Code stroke CT head significant for an acute large 4.1 cm hemorrhage centered in the pons with intraventricular extension into the fourth ventricle with extension into the basal cisterns. Repeat CT (3/9) without significant change and repeat CT (3/18) significant for interval decreased size of subacute pontine hemorrhage. CT head  (3/29) significant for evolving subacute pontine hemorrhage with subarachnoid hemorrhage and no new intracranial abnormality. Transthoracic Echocardiogram significant for moderate concentric LVH with an LVEF of 60-65%. LDL of 96. Hemoglobin A1C of 4.7%. No antithrombotic secondary to intracerebral hemorrhage. -SNF placement in progress   Primary hypertension Hypertensive emergency Patient initial managed with Cleviprex while in ICU which was weaned off. -Continue amlodipine and metoprolol   Dysphagia PEG tube placed on 3/24. Patient seen by speech therapy. -Continue tube feeds   Ileus Resolved.   Acute respiratory failure with hypoxia S/p tracheostomy Secondary to aspiration pneumonia. Patient required intubation on 3/8 and managed via mechanical ventilation. Patient converted to tracheostomy on 3/22. -Tracheostomy care per pulmonology -Continue Yupelri   Aspiration pneumonia MRSA pneumonia Patient treated empirically with Unasyn. Tracheal aspirate (4/1) significant for MRSA. Patient treated with Vancomycin with prolonged treatment for increased secretions and persistently positive tracheal aspirate (4/8). Patient transitioned to Linezolid to complete treatment. -Completed course of linezolid on 4/15   Macrocytic anemia Stable.   Thrombocytosis Resolved.   Poor dention Noted.   AKI Resolved.   Acute urinary retention Foley catheter placed on 4/13.   Hypernatremia Resolved with fluids.   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 Patient seen by ophthalmology. -Ophthalmology recommendations (4/7): maxitrol ointment TID x1 week followed by Refresh PM TID-QID for chronic lubrication; can tape eyelid closed if exposure is still an issue (plastic tape rather than paper tape and tape eye directly rather than with gauze)   Pressure injury Medial perineum. Not present on admission.         Subjective: difficult to assess given deficits  Physical Exam: Vitals:   12/05/23 0311  12/05/23 0802 12/05/23 1149 12/05/23 1200  BP: 136/88 (!) 122/90  (!) 141/100  Pulse: 88 69 74 81  Resp: 15 18 (!) 25 (!) 27  Temp: 97.8 F (36.6 C) 98.4 F (36.9 C)  98.1 F (36.7 C)  TempSrc: Oral Axillary  Oral  SpO2: 94% 97% 96% 94%  Weight:      Height:       General exam: Awake, laying in bed, in nad Respiratory system: Normal respiratory effort, no wheezing Cardiovascular system: regular rate, s1, s2 Gastrointestinal system: Soft, nondistended, positive BS Central nervous system: CN2-12 grossly intact, strength intact Extremities: Perfused, no clubbing Skin: Normal skin turgor, no notable skin lesions seen Psychiatry: difficult to assess given deficits Data Reviewed:  There are no new results to review at this time.  Family Communication: Pt in room, family not at bedside  Disposition: Status is: Inpatient Remains inpatient appropriate because: severity of illness  Planned Discharge Destination: Skilled nursing facility    Author: Cherylle Corwin, MD 12/05/2023 3:31 PM  For on call review [email or web address].

## 2023-12-05 NOTE — TOC Progression Note (Signed)
 Transition of Care [facility]) - Progression Note    Patient Details  Name: Garrett [name] MRN: [id number] Date of Birth: Aug 30, 1975  Transition of [facility]) CM/SW Contact  Jannice Mends, LCSW Phone Number: 12/05/2023, 1:48 PM  Clinical Narrative:    CSW sent updated notes to Tammy with [place] SNFs to review now that patient is no longer in need of a vent. If he no longer qualifies for their facilities, will need to locate an LOG-accepting trach SNF. Patient is approaching 30 day trach requirement. Isabella Mao will need to be changed to uncuffed prior to trach snf acceptance.    Expected Discharge Plan: Long Term Nursing Home Barriers to Discharge: Continued Medical Work up, SNF Pending Medicaid, SNF Pending bed offer, SNF Pending payor source - LOG, Inadequate or no insurance (New trach)  Expected Discharge Plan and Services In-house Referral: Clinical Social Work, Hospice / Palliative Care   Post Acute Care Choice: Skilled Nursing Facility Living arrangements for the past 2 months: Single Family Home                                       Social Determinants of Health (SDOH) Interventions SDOH Screenings   Food Insecurity: Patient Unable To Answer (10/29/2023)  Social Connections: Unknown (06/23/2023)   Received from [facility]  Tobacco Use: High Risk (10/31/2023)    Readmission Risk Interventions     No data to display

## 2023-12-05 NOTE — Progress Notes (Signed)
 Occupational Therapy Treatment Patient Details Name: Garrett [name] MRN: [id number] DOB: November 19, 1975 Today's Date: 12/05/2023   History of present illness Pt is a 48 yo male who was found down 10/27/23 with agonal breathing. 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 Intubated 3/8, cortrak placed 3/14, trach placed 3/22, PEG placed 3/24. PMH: HTN, smoker   OT comments  Pt in bed upon therapy arrival, awake and alert although no command following demonstrated or signs of communicating with OT. Session focused on grooming and P/ROM of neck and BUE at bed level. Pt with strong preference for left cervical rotation with flexion causing excessive drooling which in turn saturated trach strap. Noted the start of skin irritation and breakdown in neck crease due to excessive moisture and strap irritation. Notified RT and nursing. Nursing is supposed to change strap daily per RT. Pt able to achieve cervical rotation to the right to neutral d/t muscle tightness. With slow progressive stretching, pt did achieve past neutral during right sight lateral flexion although not WFL.      If plan is discharge home, recommend the following:  Other (comment) (total care)   Equipment Recommendations  Wheelchair cushion (measurements OT);Hospital bed;Wheelchair (measurements OT);[place] lift       Precautions / Restrictions Precautions Precautions: Fall;Other (comment) Recall of Precautions/Restrictions: Impaired Precaution/Restrictions Comments: trach collar on RA, PEG, abdominal binder, SBP < 160 Restrictions Weight Bearing Restrictions Per Provider Order: No              ADL either performed or assessed with clinical judgement   ADL Overall ADL's : Needs assistance/impaired     Grooming: Total assistance;Bed level;Wash/dry face         Extremity/Trunk Assessment Upper Extremity Assessment RUE Deficits / Details: decreased edema noted this session. LUE Deficits / Details: decreased edema noted this session.                     Communication Communication Communication: Impaired Factors Affecting Communication: Trach/intubated   Cognition Arousal: Alert Behavior During Therapy: Flat affect Cognition: Difficult to assess Difficult to assess due to: Tracheostomy           OT - Cognition Comments: R eye open during session. Did not follow any commands or demonstrate any form of communication with pt.      Following commands: Impaired Following commands impaired:  (no command following this date)      Cueing   Cueing Techniques: Verbal cues, Tactile cues  Exercises General Exercises - Upper Extremity Shoulder Flexion: PROM, Both, 5 reps, Supine Shoulder Horizontal ABduction: PROM, Both, 5 reps, Supine Shoulder Horizontal ADduction: PROM, Both, 5 reps, Supine Elbow Flexion: PROM, Both, 5 reps, Supine Elbow Extension: PROM, 5 reps, Both, Supine Wrist Flexion: PROM, Both, 5 reps, Supine Wrist Extension: PROM, Both, 5 reps, Supine Digit Composite Flexion: PROM, Both, 5 reps, Supine Composite Extension: PROM, Both, 5 reps, Supine Other Exercises Other Exercises: Supine (HOB elevated 30 degrees), P/ROM cervical rotation right/left, lateral flexion right/left, 5X each direction. Limited ROM not during right rotation and right lateral flexion.            Pertinent Vitals/ Pain       Pain Assessment Breathing: normal Negative Vocalization: none Facial Expression: smiling or inexpressive Body Language: relaxed Consolability: no need to console PAINAD Score: 0  Frequency  Min 1X/week        Progress Toward Goals  OT Goals(current goals can now be found in the care plan section)  Progress towards OT goals: Progressing toward goals (slowly)            AM-PAC OT "6 Clicks" Daily  Activity     Outcome Measure   Help from another person eating meals?: Total Help from another person taking care of personal grooming?: Total Help from another person toileting, which includes using toliet, bedpan, or urinal?: Total Help from another person bathing (including washing, rinsing, drying)?: Total Help from another person to put on and taking off regular upper body clothing?: Total Help from another person to put on and taking off regular lower body clothing?: Total 6 Click Score: 6    End of Session Equipment Utilized During Treatment: Oxygen  OT Visit Diagnosis: Other abnormalities of gait and mobility (R26.89);Muscle weakness (generalized) (M62.81);Other symptoms and signs involving the nervous system (R29.898);Other symptoms and signs involving cognitive function;Hemiplegia and hemiparesis Hemiplegia - caused by: Nontraumatic SAH   Activity Tolerance Patient tolerated treatment well   Patient Left in bed;with call bell/phone within reach   Nurse Communication Other (comment) (need for trach strap change and noted skin irritation)        Time: 0981-1914 OT Time Calculation (min): 31 min  Charges: OT General Charges $OT Visit: 1 Visit OT Treatments $Therapeutic Activity: 23-37 mins  Carollee Circle, OTR/L,CBIS  Supplemental OT - [facility] and [facility] Secure Chat Preferred    Garrett [name], Garrett [name] 12/05/2023, 12:02 PM

## 2023-12-06 DIAGNOSIS — I619 Nontraumatic intracerebral hemorrhage, unspecified: Secondary | ICD-10-CM | POA: Diagnosis not present

## 2023-12-06 DIAGNOSIS — A419 Sepsis, unspecified organism: Secondary | ICD-10-CM | POA: Diagnosis not present

## 2023-12-06 LAB — GLUCOSE, CAPILLARY
Glucose-Capillary: 101 mg/dL — ABNORMAL HIGH (ref 70–99)
Glucose-Capillary: 104 mg/dL — ABNORMAL HIGH (ref 70–99)
Glucose-Capillary: 106 mg/dL — ABNORMAL HIGH (ref 70–99)
Glucose-Capillary: 107 mg/dL — ABNORMAL HIGH (ref 70–99)
Glucose-Capillary: 108 mg/dL — ABNORMAL HIGH (ref 70–99)
Glucose-Capillary: 128 mg/dL — ABNORMAL HIGH (ref 70–99)

## 2023-12-06 MED ORDER — GERHARDT'S BUTT CREAM
TOPICAL_CREAM | CUTANEOUS | Status: DC | PRN
  Administered 2024-02-15: 1 via TOPICAL
  Filled 2023-12-06 (×2): qty 60

## 2023-12-06 MED ORDER — JUVEN PO PACK
1.0000 | PACK | Freq: Two times a day (BID) | ORAL | Status: DC
  Filled 2023-12-06: qty 1

## 2023-12-06 MED ORDER — JUVEN PO PACK: 1.0000 | PACK | Freq: Two times a day (BID) | ORAL | Status: DC

## 2023-12-06 MED ORDER — JUVEN PO PACK
1.0000 | PACK | Freq: Two times a day (BID) | ORAL | Status: DC
  Administered 2023-12-06 – 2024-01-16 (×80): 1
  Filled 2023-12-06 (×78): qty 1

## 2023-12-06 NOTE — Progress Notes (Addendum)
 Physical Therapy Treatment Patient Details Name: Garrett [name] MRN: [id number] DOB: 07-14-1976 Today's Date: 12/06/2023   History of Present Illness Pt is a 48 yo male who was found down 10/27/23 with agonal breathing. 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 Intubated 3/8, cortrak placed 3/14, trach placed 3/22, PEG placed 3/24. PMH: HTN, smoker    PT Comments  The pt continues to not be following commands at this time. There were x2 occasions he questionably followed verbal commands to move his L hand and wiggles his L toes but it was unclear whether it was intentional or coincidental or just a response to tactile stimuli. He did actively flex his L knee also 1x, but again unclear if it was to tactile stimuli or cuing. Educated pt's family on calling pt's name then giving short, precise commands and giving the pt time to respond. They verbalized and demonstrated understanding. Pt required total assist to roll bil and to be lifted via maximove out of bed to the chair. Placed pt sitting reclined in the chair with lights on and music playing to try to increase arousal and attention. The family and RN mentioned they believe the pt may be depressed. If he is cleared to leave the unit, he may benefit from going outside in one of the future sessions. Will continue to follow acutely. Of note, pt has moments of decerebrate posturing during the session.    If plan is discharge home, recommend the following: Two people to help with walking and/or transfers;Two people to help with bathing/dressing/bathroom;Assistance with cooking/housework;Assistance with feeding;Direct supervision/assist for medications management;Direct supervision/assist for financial management;Assist for transportation;Help with stairs or ramp for entrance;Supervision due  to cognitive status   Can travel by private vehicle        Equipment Recommendations  [place] lift;Hospital bed;Wheelchair (measurements PT);Wheelchair cushion (measurements PT);BSC/3in1;Other (comment) (air mattress)    Recommendations for Other Services       Precautions / Restrictions Precautions Precautions: Fall;Other (comment) Recall of Precautions/Restrictions: Impaired Precaution/Restrictions Comments: trach, PEG, abdominal binder, SBP < 160 Restrictions Weight Bearing Restrictions Per Provider Order: No     Mobility  Bed Mobility Overal bed mobility: Needs Assistance Bed Mobility: Rolling Rolling: Total assist         General bed mobility comments: TotalA for rolling to R/L for placement of maximove lift pad    Transfers Overall transfer level: Needs assistance   Transfers: Bed to chair/wheelchair/BSC             General transfer comment: Maximove utilized to lift pt dependently from bed to chair Transfer via Lift Equipment: Maximove  Ambulation/Gait               General Gait Details: deferred, pt not following cues currently or able to support himself in sitting   Stairs             Wheelchair Mobility     Tilt Bed    Modified Rankin (Stroke Patients Only) Modified Rankin (Stroke Patients Only) Pre-Morbid Rankin Score: No symptoms Modified Rankin: Severe disability     Balance Overall balance assessment: Needs assistance Sitting-balance support: No upper extremity supported, Feet supported Sitting balance-Leahy Scale: Zero Sitting balance - Comments: Pt requiring total assist to lift his trunk off the back of the chair Postural control: Posterior lean     Standing balance comment: deferred, pt not  following cues currently or able to support himself in sitting                            Communication Communication Communication: Impaired Factors Affecting Communication: Trach/intubated  Cognition Arousal:  Alert Behavior During Therapy: Flat affect   PT - Cognitive impairments: Difficult to assess Difficult to assess due to: Tracheostomy                     PT - Cognition Comments: Pt awake with eyes open upon arrival. His eyes tend to stay forward or to the L, not tracking therapist but occasionally potentially looking at therapist straight ahead of him on occasion, unclear if coincidental. Unsure if pt followed verbal commands 2x to wiggle L toes and move L hand or if pt was just responding to tactile stimuli. Otherwise, no intentional command following noted during session. Noted occasional decerebrate posturing when stimulated. Following commands: Impaired Following commands impaired: Follows one step commands inconsistently (maybe 2x only during session?)    Cueing Cueing Techniques: Verbal cues, Tactile cues, Visual cues  Exercises Other Exercises Other Exercises: cervical rotation and lateral flexion to R, positioned towel roll to keep pt midline end of session Other Exercises: attempted AAROM of extremities but pt not activating except maybe x2 when cued    General Comments General comments (skin integrity, edema, etc.): VSS on trach collar 6L 21% FiO2; educated family on calling pt's name then giving short, precise command and giving pt time to respond      Pertinent Vitals/Pain Pain Assessment Pain Assessment: Faces Faces Pain Scale: Hurts a little bit Pain Location: grimacing with mobility in general Pain Descriptors / Indicators: Grimacing Pain Intervention(s): Monitored during session, Limited activity within patient's tolerance, Repositioned    Home Living                          Prior Function            PT Goals (current goals can now be found in the care plan section) Acute Rehab PT Goals Patient Stated Goal: unable to state PT Goal Formulation: With family Time For Goal Achievement: 12/13/23 Potential to Achieve Goals: Fair Progress towards  PT goals: Not progressing toward goals - comment (pt not following cues consistently)    Frequency    Min 2X/week      PT Plan      Co-evaluation              AM-PAC PT "6 Clicks" Mobility   Outcome Measure  Help needed turning from your back to your side while in a flat bed without using bedrails?: Total Help needed moving from lying on your back to sitting on the side of a flat bed without using bedrails?: Total Help needed moving to and from a bed to a chair (including a wheelchair)?: Total Help needed standing up from a chair using your arms (e.g., wheelchair or bedside chair)?: Total Help needed to walk in hospital room?: Total Help needed climbing 3-5 steps with a railing? : Total 6 Click Score: 6    End of Session Equipment Utilized During Treatment: Oxygen Activity Tolerance: Patient tolerated treatment well Patient left: in chair;with call bell/phone within reach;with chair alarm set Nurse Communication: Mobility status;Need for lift equipment PT Visit Diagnosis: Muscle weakness (generalized) (M62.81);Difficulty in walking, not elsewhere classified (R26.2);Other symptoms and signs involving the nervous system (R29.898);Hemiplegia  and hemiparesis Hemiplegia - Right/Left: Right Hemiplegia - dominant/non-dominant: Dominant Hemiplegia - caused by: Nontraumatic intracerebral hemorrhage     Time: 1349-1431 PT Time Calculation (min) (ACUTE ONLY): 42 min  Charges:    $Therapeutic Activity: 38-52 mins PT General Charges $$ ACUTE PT VISIT: 1 Visit                     Vernida Goodie, PT, DPT Acute Rehabilitation Services  Office: [phone]    Ellyn Hack 12/06/2023, 4:52 PM

## 2023-12-06 NOTE — Progress Notes (Signed)
 Progress Note   Patient: Garrett [name] WUJ:[id number] DOB: 31-Mar-1976 DOA: 10/27/2023     40 DOS: the patient was seen and examined on 12/06/2023   Brief hospital course: 48 y.o. male with a history of hypertension.  Patient presented secondary to being found down with agonal bleeding by his fiancee. 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 The hemorrhage was initially assessed with a CT scan, revealing a 4.1 cm acute lesion, and was followed up with repeat imaging showing a decrease in size over time, although subarachnoid hemorrhage was also noted on subsequent scans. The patient was managed for primary hypertension and hypertensive emergency, with Cleviprex initiated in the ICU, which was later weaned off. Due to dysphagia, a PEG tube was placed on 3/24. The patient also experienced acute respiratory failure secondary to aspiration pneumonia, requiring intubation on 3/8 and subsequent tracheostomy on 3/22. MRSA pneumonia was identified in tracheal aspirates, and the patient was initially treated with Unasyn, later switched to Vancomycin, and transitioned to Linezolid to complete the course. The patient's ileus resolved, and no new intracranial abnormalities were noted after the subacute phase of the hemorrhage. Tracheostomy care was provided by pulmonology, and Delfin Fell was continued for respiratory management.    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 Code stroke CT head significant for an acute large 4.1 cm hemorrhage centered in the pons with intraventricular extension into the fourth ventricle with extension into the basal cisterns. Repeat CT (3/9) without significant change and repeat CT (3/18) significant for interval decreased size of subacute pontine hemorrhage. CT head  (3/29) significant for evolving subacute pontine hemorrhage with subarachnoid hemorrhage and no new intracranial abnormality. Transthoracic Echocardiogram significant for moderate concentric LVH with an LVEF of 60-65%. LDL of 96. Hemoglobin A1C of 4.7%. No antithrombotic secondary to intracerebral hemorrhage. -SNF placement in progress   Primary hypertension Hypertensive emergency Patient initial managed with Cleviprex while in ICU which was weaned off. -Continue amlodipine and metoprolol. BP remains stable and controlled   Dysphagia PEG tube placed on 3/24. Patient seen by speech therapy. -Continue tube feeds   Ileus Resolved.   Acute respiratory failure with hypoxia S/p tracheostomy Secondary to aspiration pneumonia. Patient required intubation on 3/8 and managed via mechanical ventilation. Patient converted to tracheostomy on 3/22. -Tracheostomy care per pulmonology -Continue Yupelri   Aspiration pneumonia MRSA pneumonia Patient treated empirically with Unasyn. Tracheal aspirate (4/1) significant for MRSA. Patient treated with Vancomycin with prolonged treatment for increased secretions and persistently positive tracheal aspirate (4/8). Patient transitioned to Linezolid to complete treatment. -Completed course of linezolid on 4/15   Macrocytic anemia Stable.   Thrombocytosis Resolved.   Poor dention Noted.   AKI Resolved.   Acute urinary retention Foley catheter placed on 4/13.   Hypernatremia Resolved with fluids.   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 Patient seen by ophthalmology. -Ophthalmology recommendations (4/7): maxitrol ointment TID x1 week followed by Refresh PM TID-QID for chronic lubrication; can tape eyelid closed if exposure is still an issue (plastic tape rather than paper tape and tape eye directly rather than with gauze)   Pressure injury Medial perineum. Not present on admission.         Subjective: Difficult to assess given deficits  Physical  Exam: Vitals:   12/06/23 1112 12/06/23 1206 12/06/23 1600 12/06/23 1625  BP:  (!) 151/99 137/87 113/82  Pulse:  81 80 83 78  Resp: (!) 0 20 20 19   Temp:  99.1 F (37.3 C)  99 F (37.2 C)  TempSrc:  Oral  Oral  SpO2: 97% 99% 96% 96%  Weight:      Height:       General exam: Laying in bed, in no acute distress Respiratory system: normal chest rise, clear, no audible wheezing Cardiovascular system: regular rhythm, s1-s2 Gastrointestinal system: Nondistended, nontender, pos BS Central nervous system: No seizures, no tremors Extremities: No cyanosis, no joint deformities Skin: No rashes, no pallor Psychiatry: difficult to assess given deficits  Data Reviewed:  There are no new results to review at this time.  Family Communication: Pt in room, family not at bedside  Disposition: Status is: Inpatient Remains inpatient appropriate because: severity of illness  Planned Discharge Destination: Skilled nursing facility    Author: Cherylle Corwin, MD 12/06/2023 5:11 PM  For on call review [email or web address].

## 2023-12-06 NOTE — Plan of Care (Signed)
 Problem: Education: Goal: Knowledge of disease or condition will improve Outcome: Progressing Goal: Knowledge of secondary prevention will improve (MUST DOCUMENT ALL) Outcome: Progressing Goal: Knowledge of patient specific risk factors will improve (DELETE if not current risk factor) Outcome: Progressing   Problem: Intracerebral Hemorrhage Tissue Perfusion: Goal: Complications of Intracerebral Hemorrhage will be minimized Outcome: Progressing   Problem: Coping: Goal: Will verbalize positive feelings about self Outcome: Progressing Goal: Will identify appropriate support needs Outcome: Progressing   Problem: Health Behavior/Discharge Planning: Goal: Ability to manage health-related needs will improve Outcome: Progressing Goal: Goals will be collaboratively established with patient/family Outcome: Progressing   Problem: Self-Care: Goal: Ability to participate in self-care as condition permits will improve Outcome: Progressing Goal: Verbalization of feelings and concerns over difficulty with self-care will improve Outcome: Progressing Goal: Ability to communicate needs accurately will improve Outcome: Progressing   Problem: Nutrition: Goal: Risk of aspiration will decrease Outcome: Progressing Goal: Dietary intake will improve Outcome: Progressing   Problem: Education: Goal: Ability to describe self-care measures that may prevent or decrease complications (Diabetes Survival Skills Education) will improve Outcome: Progressing Goal: Individualized Educational Video(s) Outcome: Progressing   Problem: Coping: Goal: Ability to adjust to condition or change in health will improve Outcome: Progressing   Problem: Fluid Volume: Goal: Ability to maintain a balanced intake and output will improve Outcome: Progressing   Problem: Health Behavior/Discharge Planning: Goal: Ability to identify and utilize available resources and services will improve Outcome: Progressing Goal:  Ability to manage health-related needs will improve Outcome: Progressing   Problem: Metabolic: Goal: Ability to maintain appropriate glucose levels will improve Outcome: Progressing   Problem: Nutritional: Goal: Maintenance of adequate nutrition will improve Outcome: Progressing Goal: Progress toward achieving an optimal weight will improve Outcome: Progressing   Problem: Skin Integrity: Goal: Risk for impaired skin integrity will decrease Outcome: Progressing   Problem: Tissue Perfusion: Goal: Adequacy of tissue perfusion will improve Outcome: Progressing   Problem: Education: Goal: Knowledge of General Education information will improve Description: Including pain rating scale, medication(s)/side effects and non-pharmacologic comfort measures Outcome: Progressing   Problem: Health Behavior/Discharge Planning: Goal: Ability to manage health-related needs will improve Outcome: Progressing   Problem: Clinical Measurements: Goal: Ability to maintain clinical measurements within normal limits will improve Outcome: Progressing Goal: Will remain free from infection Outcome: Progressing Goal: Diagnostic test results will improve Outcome: Progressing Goal: Respiratory complications will improve Outcome: Progressing Goal: Cardiovascular complication will be avoided Outcome: Progressing   Problem: Activity: Goal: Risk for activity intolerance will decrease Outcome: Progressing   Problem: Nutrition: Goal: Adequate nutrition will be maintained Outcome: Progressing   Problem: Coping: Goal: Level of anxiety will decrease Outcome: Progressing   Problem: Elimination: Goal: Will not experience complications related to bowel motility Outcome: Progressing Goal: Will not experience complications related to urinary retention Outcome: Progressing   Problem: Pain Managment: Goal: General experience of comfort will improve and/or be controlled Outcome: Progressing   Problem:  Safety: Goal: Ability to remain free from injury will improve Outcome: Progressing   Problem: Skin Integrity: Goal: Risk for impaired skin integrity will decrease Outcome: Progressing   Problem: Education: Goal: 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 Outcome: Progressing   Problem: Activity: Goal: Ability to tolerate increased activity will improve Outcome: Progressing   Problem: Health Behavior/Discharge Planning: Goal: Ability to manage tracheostomy will improve Outcome: Progressing   Problem: Respiratory: Goal: Patent airway maintenance will improve Outcome: Progressing   Problem: Role Relationship: Goal: Ability to communicate will improve Outcome: Progressing

## 2023-12-06 NOTE — Plan of Care (Signed)
  Problem: Education: Goal: Knowledge of disease or condition will improve Outcome: Not Progressing   Problem: Health Behavior/Discharge Planning: Goal: Goals will be collaboratively established with patient/family Outcome: Progressing   Problem: Self-Care: Goal: Ability to communicate needs accurately will improve Outcome: Not Progressing   Problem: Nutrition: Goal: Risk of aspiration will decrease Outcome: Not Progressing   Problem: Metabolic: Goal: Ability to maintain appropriate glucose levels will improve Outcome: Progressing   Problem: Nutritional: Goal: Maintenance of adequate nutrition will improve Outcome: Progressing   Problem: Clinical Measurements: Goal: Cardiovascular complication will be avoided Outcome: Progressing   Problem: Coping: Goal: Level of anxiety will decrease Outcome: Progressing

## 2023-12-06 NOTE — Progress Notes (Signed)
 VAST consult for PIV placement. Arrived to unit. Patient up in chair. Instructed RN to place consult when patient back to bed. VU. Theophilus Fitz, RN VAST

## 2023-12-07 LAB — BASIC METABOLIC PANEL WITH GFR
Anion gap: 10 (ref 5–15)
BUN: 21 mg/dL — ABNORMAL HIGH (ref 6–20)
CO2: 26 mmol/L (ref 22–32)
Calcium: 9.1 mg/dL (ref 8.9–10.3)
Chloride: 101 mmol/L (ref 98–111)
Creatinine, Ser: 0.9 mg/dL (ref 0.61–1.24)
GFR, Estimated: >60 mL/min (ref >60)
Glucose, Bld: 121 mg/dL — ABNORMAL HIGH (ref 70–99)
Potassium: 4 mmol/L (ref 3.5–5.1)
Sodium: 137 mmol/L (ref 135–145)

## 2023-12-07 LAB — CBC
HCT: 32 % — ABNORMAL LOW (ref 39.0–52.0)
Hemoglobin: 10.1 g/dL — ABNORMAL LOW (ref 13.0–17.0)
MCH: 31.6 pg (ref 26.0–34.0)
MCHC: 31.6 g/dL (ref 30.0–36.0)
MCV: 100 fL (ref 80.0–100.0)
Platelets: 328 10*3/uL (ref 150–400)
RBC: 3.2 MIL/uL — ABNORMAL LOW (ref 4.22–5.81)
RDW: 14.6 % (ref 11.5–15.5)
WBC: 9.5 10*3/uL (ref 4.0–10.5)
nRBC: 0 % (ref 0.0–0.2)

## 2023-12-07 LAB — GLUCOSE, CAPILLARY
Glucose-Capillary: 109 mg/dL — ABNORMAL HIGH (ref 70–99)
Glucose-Capillary: 112 mg/dL — ABNORMAL HIGH (ref 70–99)
Glucose-Capillary: 147 mg/dL — ABNORMAL HIGH (ref 70–99)
Glucose-Capillary: 117 mg/dL — ABNORMAL HIGH (ref 70–99)
Glucose-Capillary: 114 mg/dL — ABNORMAL HIGH (ref 70–99)

## 2023-12-08 DIAGNOSIS — I619 Nontraumatic intracerebral hemorrhage, unspecified: Secondary | ICD-10-CM

## 2023-12-08 DIAGNOSIS — A419 Sepsis, unspecified organism: Secondary | ICD-10-CM | POA: Diagnosis not present

## 2023-12-08 LAB — GLUCOSE, CAPILLARY
Glucose-Capillary: 102 mg/dL — ABNORMAL HIGH (ref 70–99)
Glucose-Capillary: 103 mg/dL — ABNORMAL HIGH (ref 70–99)
Glucose-Capillary: 105 mg/dL — ABNORMAL HIGH (ref 70–99)
Glucose-Capillary: 106 mg/dL — ABNORMAL HIGH (ref 70–99)
Glucose-Capillary: 128 mg/dL — ABNORMAL HIGH (ref 70–99)
Glucose-Capillary: 133 mg/dL — ABNORMAL HIGH (ref 70–99)
Glucose-Capillary: 142 mg/dL — ABNORMAL HIGH (ref 70–99)

## 2023-12-08 NOTE — Progress Notes (Signed)
 Progress Note   Patient: Garrett [name] [id number]:[id number] DOB: 1975/09/21 DOA: 10/27/2023     42 DOS: the patient was seen and examined on 12/08/2023   Brief hospital course: 48 y.o. male with a history of hypertension.  Patient presented secondary to being found down with agonal bleeding by his fiancee. 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 The hemorrhage was initially assessed with a CT scan, revealing a 4.1 cm acute lesion, and was followed up with repeat imaging showing a decrease in size over time, although subarachnoid hemorrhage was also noted on subsequent scans. The patient was managed for primary hypertension and hypertensive emergency, with Cleviprex  initiated in the ICU, which was later weaned off. Due to dysphagia, a PEG tube was placed on 3/24. The patient also experienced acute respiratory failure secondary to aspiration pneumonia, requiring intubation on 3/8 and subsequent tracheostomy on 3/22. MRSA pneumonia was identified in tracheal aspirates, and the patient was initially treated with Unasyn , later switched to Vancomycin , and transitioned to Linezolid  to complete the course. The patient's ileus resolved, and no new intracranial abnormalities were noted after the subacute phase of the hemorrhage. Tracheostomy care was provided by pulmonology, and Yupelri  was continued for respiratory management.    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 Code stroke CT head significant for an acute large 4.1 cm hemorrhage centered in the pons with intraventricular extension into the fourth ventricle with extension into the basal cisterns. Repeat CT (3/9) without significant change and repeat CT (3/18) significant for interval decreased size of subacute pontine hemorrhage. CT head  (3/29) significant for evolving subacute pontine hemorrhage with subarachnoid hemorrhage and no new intracranial abnormality. Transthoracic Echocardiogram significant for moderate concentric LVH with an LVEF of 60-65%. LDL of 96. Hemoglobin A1C of 4.7%. No antithrombotic secondary to intracerebral hemorrhage. -SNF placement in progress. Barrier to placement would be down grade from cuffed trach. PCCM following for trach care   Primary hypertension Hypertensive emergency Patient initial managed with Cleviprex  while in ICU which was weaned off. -Continue amlodipine  and metoprolol . BP remains stable and controlled   Dysphagia PEG tube placed on 3/24. Patient seen by speech therapy. -Continue tube feeds   Ileus Resolved.   Acute respiratory failure with hypoxia S/p tracheostomy Secondary to aspiration pneumonia. Patient required intubation on 3/8 and managed via mechanical ventilation. Patient converted to tracheostomy on 3/22. -Tracheostomy care per pulmonology. Hopeful that pt can downgrade from cuffed trach -Continue Yupelri    Aspiration pneumonia MRSA pneumonia Patient treated empirically with Unasyn . Tracheal aspirate (4/1) significant for MRSA. Patient treated with Vancomycin  with prolonged treatment for increased secretions and persistently positive tracheal aspirate (4/8). Patient transitioned to Linezolid  to complete treatment. -Completed course of linezolid  on 4/15   Macrocytic anemia Stable.   Thrombocytosis Resolved.   Poor dention Noted.   AKI Resolved.   Acute urinary retention Foley catheter placed on 4/13.   Hypernatremia Resolved with fluids.   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 Patient seen by ophthalmology. -Ophthalmology recommendations (4/7): maxitrol ointment TID x1 week followed by Refresh PM TID-QID for chronic lubrication; can tape eyelid closed if exposure is still an issue (plastic tape rather than paper tape and tape eye directly rather than with  gauze)   Pressure injury Medial perineum. Not present on admission.         Subjective: Unable to assess given deficits  Physical Exam: Vitals:   12/08/23 0500 12/08/23 0750 12/08/23 1330 12/08/23 1555  BP:  (!) 129/92 (!) 118/91 137/89  Pulse: 76 75 80 86  Resp: 20 18 (!) 22 (!) 24  Temp:  99 F (37.2 C) 98.9 F (37.2 C) 98.3 F (36.8 C)  TempSrc:  Axillary Axillary Axillary  SpO2: 99% 98% 97% 97%  Weight:      Height:       General exam: Not conversant, in no acute distress Respiratory system: normal chest rise, clear, no audible wheezing Cardiovascular system: regular rhythm, s1-s2 Gastrointestinal system: Nondistended, nontender, pos BS Central nervous system: No seizures, no tremors Extremities: No cyanosis, no joint deformities Skin: No rashes, no pallor Psychiatry: Difficult to assess given mentation  Data Reviewed:  There are no new results to review at this time.  Family Communication: Pt in room, family not at bedside  Disposition: Status is: Inpatient Remains inpatient appropriate because: severity of illness  Planned Discharge Destination: Skilled nursing facility    Author: Cherylle Corwin, MD 12/08/2023 4:59 PM  For on call review [email or web address].

## 2023-12-08 NOTE — Plan of Care (Signed)
 Problem: Education: Goal: Knowledge of disease or condition will improve Outcome: Progressing Goal: Knowledge of secondary prevention will improve (MUST DOCUMENT ALL) Outcome: Progressing Goal: Knowledge of patient specific risk factors will improve (DELETE if not current risk factor) Outcome: Progressing   Problem: Intracerebral Hemorrhage Tissue Perfusion: Goal: Complications of Intracerebral Hemorrhage will be minimized Outcome: Progressing   Problem: Coping: Goal: Will verbalize positive feelings about self Outcome: Progressing Goal: Will identify appropriate support needs Outcome: Progressing   Problem: Health Behavior/Discharge Planning: Goal: Ability to manage health-related needs will improve Outcome: Progressing Goal: Goals will be collaboratively established with patient/family Outcome: Progressing   Problem: Self-Care: Goal: Ability to participate in self-care as condition permits will improve Outcome: Progressing Goal: Verbalization of feelings and concerns over difficulty with self-care will improve Outcome: Progressing Goal: Ability to communicate needs accurately will improve Outcome: Progressing   Problem: Nutrition: Goal: Risk of aspiration will decrease Outcome: Progressing Goal: Dietary intake will improve Outcome: Progressing   Problem: Education: Goal: Ability to describe self-care measures that may prevent or decrease complications (Diabetes Survival Skills Education) will improve Outcome: Progressing Goal: Individualized Educational Video(s) Outcome: Progressing   Problem: Coping: Goal: Ability to adjust to condition or change in health will improve Outcome: Progressing   Problem: Fluid Volume: Goal: Ability to maintain a balanced intake and output will improve Outcome: Progressing   Problem: Health Behavior/Discharge Planning: Goal: Ability to identify and utilize available resources and services will improve Outcome: Progressing Goal:  Ability to manage health-related needs will improve Outcome: Progressing   Problem: Metabolic: Goal: Ability to maintain appropriate glucose levels will improve Outcome: Progressing   Problem: Nutritional: Goal: Maintenance of adequate nutrition will improve Outcome: Progressing Goal: Progress toward achieving an optimal weight will improve Outcome: Progressing   Problem: Skin Integrity: Goal: Risk for impaired skin integrity will decrease Outcome: Progressing   Problem: Tissue Perfusion: Goal: Adequacy of tissue perfusion will improve Outcome: Progressing   Problem: Education: Goal: Knowledge of General Education information will improve Description: Including pain rating scale, medication(s)/side effects and non-pharmacologic comfort measures Outcome: Progressing   Problem: Health Behavior/Discharge Planning: Goal: Ability to manage health-related needs will improve Outcome: Progressing   Problem: Clinical Measurements: Goal: Ability to maintain clinical measurements within normal limits will improve Outcome: Progressing Goal: Will remain free from infection Outcome: Progressing Goal: Diagnostic test results will improve Outcome: Progressing Goal: Respiratory complications will improve Outcome: Progressing Goal: Cardiovascular complication will be avoided Outcome: Progressing   Problem: Activity: Goal: Risk for activity intolerance will decrease Outcome: Progressing   Problem: Nutrition: Goal: Adequate nutrition will be maintained Outcome: Progressing   Problem: Coping: Goal: Level of anxiety will decrease Outcome: Progressing   Problem: Elimination: Goal: Will not experience complications related to bowel motility Outcome: Progressing Goal: Will not experience complications related to urinary retention Outcome: Progressing   Problem: Pain Managment: Goal: General experience of comfort will improve and/or be controlled Outcome: Progressing   Problem:  Safety: Goal: Ability to remain free from injury will improve Outcome: Progressing   Problem: Skin Integrity: Goal: Risk for impaired skin integrity will decrease Outcome: Progressing   Problem: Education: Goal: 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 Outcome: Progressing   Problem: Activity: Goal: Ability to tolerate increased activity will improve Outcome: Progressing   Problem: Health Behavior/Discharge Planning: Goal: Ability to manage tracheostomy will improve Outcome: Progressing   Problem: Respiratory: Goal: Patent airway maintenance will improve Outcome: Progressing   Problem: Role Relationship: Goal: Ability to communicate will improve Outcome: Progressing

## 2023-12-09 DIAGNOSIS — I619 Nontraumatic intracerebral hemorrhage, unspecified: Secondary | ICD-10-CM | POA: Diagnosis not present

## 2023-12-09 DIAGNOSIS — I613 Nontraumatic intracerebral hemorrhage in brain stem: Secondary | ICD-10-CM | POA: Diagnosis not present

## 2023-12-09 DIAGNOSIS — Z930 Tracheostomy status: Secondary | ICD-10-CM | POA: Diagnosis not present

## 2023-12-09 DIAGNOSIS — A419 Sepsis, unspecified organism: Secondary | ICD-10-CM | POA: Diagnosis not present

## 2023-12-09 LAB — GLUCOSE, CAPILLARY
Glucose-Capillary: 101 mg/dL — ABNORMAL HIGH (ref 70–99)
Glucose-Capillary: 144 mg/dL — ABNORMAL HIGH (ref 70–99)
Glucose-Capillary: 142 mg/dL — ABNORMAL HIGH (ref 70–99)
Glucose-Capillary: 95 mg/dL (ref 70–99)
Glucose-Capillary: 133 mg/dL — ABNORMAL HIGH (ref 70–99)
Glucose-Capillary: 93 mg/dL (ref 70–99)

## 2023-12-09 NOTE — Progress Notes (Signed)
 NAME:  Garrett [name], MRN:  [id number], DOB:  Nov 28, 1975, LOS: 43 ADMISSION DATE:  10/27/2023, CONSULTATION DATE:  10/27/2023 REFERRING MD:  Florentino Hurdle, DO CHIEF COMPLAINT:  Found Down  History of Present Illness:  48 y/o male with past medical history of hypertension who was found down for unknown downtime by his fiance when she came home from work. Reportedly, having agonal breathing.  He apparently had driven her to work this morning.  He has HTN but never checks his BP and does not follow with MD.  She says you can tell when his BP is really high; his eyes get blood shot and he is sweating.  No h/o seizures.  Besides almost daily [name] and cigarette smoking, she is not aware of any other drugs. Drug screen positive for opioids and he was given several doses of Narcan . Patient found to have:  1. 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 Basal cisterns and fourth ventricle are effaced without hydrocephalus at this time. 2. Trace additional subarachnoid hemorrhage along the left parietal convexity. BP in ED 262/156. His CXR revealed a RUL and perihilar infiltrates suggesting aspiration pneumonia. ED labs also revealed PH 7.169, LA 4.4, CPK 985. Patient was intubated in ED.  Pertinent  Medical History  hypertension  Significant Hospital Events: Including procedures, antibiotic start and stop dates in addition to other pertinent events   3/8: Intubated in ED, pontine bleed/SAH. CT head 17:09 Acute large hemorrhage 4.1 cm in pons with intraventricular extension without hydrocephalus 3/9: CTA 03:14 AM No change in IPH in pons, similar biparietal [facility] 3/14: Now awake, appears locked in can follow commands with eyes. 3/16 Purposeful with left upper extremity  3/22 tracheostomy placed at bedside, bleeding issues overnight from trach site 3/24 PEG (Dr. Hildy Lowers) 3/24  sputum culture with MRSA 3/27 vomited. TF on hold. Abd film c/w ileus. Added reglan , got SSE. Did tolerate PSV all day  3/28 BMs x2 after SSE day prior. Added back TFs at 1/2 rated. Tolerated PS 3/29 Tolerated PS  3/31 Tolerating CPAP PS 15/5, TF on hold due to ileus. 4/1: con't to hold tube feeds today, restarting vancomycin  and sending tracheal aspirate as peaks are 37, plat 24, driving 19. Thick secretions. Fever overnight.  4/2: peaks improved. Ongoing hiccups. Trickle feeding. Neuro exam unchanged.   Interim History / Subjective:   Has been doing well on trach collar Has 8 shiley XLT proximal cuffed trach in place No issues with secretions at this time  Objective   Blood pressure 128/83, pulse 76, temperature 98.6 F (37 C), temperature source Oral, resp. rate 20, height 5\' 8"  (1.727 m), weight 99.6 kg, SpO2 96%.    FiO2 (%):  [21 %] 21 %   Intake/Output Summary (Last 24 hours) at 12/09/2023 1112 Last data filed at 12/09/2023 0300 Gross per 24 hour  Intake --  Output 1100 ml  Net -1100 ml   Filed Weights   12/02/23 0500 12/03/23 0500 12/04/23 0312  Weight: 101.9 kg 100.8 kg 99.6 kg   Physical Exam: General: middle aged male, lying in bed, no distress minimally responsive.  HEENT: Tracheostomy in place. Minimal secretions. Chest: Clear bilaterally CV: HS normal  Abdomen: PEG in place.  Extremities: No edema. Neuro: eyes open, Not following commands.   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   - trach exchange to cuffless trach ordered - continue trach collar - PRN duonebs and suctionioning  Duaine German, MD [facility] Office: [phone]   See Amion for personal pager PCCM on call pager [phone] until 7pm. Please call Elink 7p-7a.  [phone]

## 2023-12-09 NOTE — Progress Notes (Signed)
 Pt due for routine trach change. Per Dr. Diania Fortes CCM pt to be changed to a #8 Proximal XLT cuffless. Supplies ordered but not available at this time. RT to perform when supplies arrives.

## 2023-12-09 NOTE — Progress Notes (Signed)
 Progress Note   Patient: Garrett [name] [id number] DOB: 27-Jun-1976 DOA: 10/27/2023     43 DOS: the patient was seen and examined on 12/09/2023   Brief hospital course: 48 y.o. male with a history of hypertension.  Patient presented secondary to being found down with agonal bleeding by his fiancee. 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 The hemorrhage was initially assessed with a CT scan, revealing a 4.1 cm acute lesion, and was followed up with repeat imaging showing a decrease in size over time, although subarachnoid hemorrhage was also noted on subsequent scans. The patient was managed for primary hypertension and hypertensive emergency, with Cleviprex  initiated in the ICU, which was later weaned off. Due to dysphagia, a PEG tube was placed on 3/24. The patient also experienced acute respiratory failure secondary to aspiration pneumonia, requiring intubation on 3/8 and subsequent tracheostomy on 3/22. MRSA pneumonia was identified in tracheal aspirates, and the patient was initially treated with Unasyn , later switched to Vancomycin , and transitioned to Linezolid  to complete the course. The patient's ileus resolved, and no new intracranial abnormalities were noted after the subacute phase of the hemorrhage. Tracheostomy care was provided by pulmonology, and Yupelri  was continued for respiratory management.    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 Code stroke CT head significant for an acute large 4.1 cm hemorrhage centered in the pons with intraventricular extension into the fourth ventricle with extension into the basal cisterns. Repeat CT (3/9) without significant change and repeat CT (3/18) significant for interval decreased size of subacute pontine hemorrhage. CT head  (3/29) significant for evolving subacute pontine hemorrhage with subarachnoid hemorrhage and no new intracranial abnormality. Transthoracic Echocardiogram significant for moderate concentric LVH with an LVEF of 60-65%. LDL of 96. Hemoglobin A1C of 4.7%. No antithrombotic secondary to intracerebral hemorrhage. -SNF placement in progress. Barrier to placement would be down grade from cuffed trach. PCCM following. Orders for cuffless trach placed by PCCM on 4/20   Primary hypertension Hypertensive emergency Patient initial managed with Cleviprex  while in ICU which was weaned off. -Continue amlodipine  and metoprolol . BP remains stable and controlled   Dysphagia PEG tube placed on 3/24. Patient seen by speech therapy. -Continuing with tube feeding   Ileus Resolved.   Acute respiratory failure with hypoxia S/p tracheostomy Secondary to aspiration pneumonia. Patient required intubation on 3/8 and managed via mechanical ventilation. Patient converted to tracheostomy on 3/22. -Tracheostomy care per pulmonology. Orders for cuffless trach 4/20 placed -Continue Yupelri    Aspiration pneumonia MRSA pneumonia Patient treated empirically with Unasyn . Tracheal aspirate (4/1) significant for MRSA. Patient treated with Vancomycin  with prolonged treatment for increased secretions and persistently positive tracheal aspirate (4/8). Patient transitioned to Linezolid  to complete treatment. -Completed course of linezolid  on 4/15   Macrocytic anemia Stable.   Thrombocytosis Resolved.   Poor dention Noted.   AKI Resolved.   Acute urinary retention Foley catheter placed on 4/13.   Hypernatremia Resolved with fluids.   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 Patient seen by ophthalmology. -Ophthalmology recommendations (4/7): maxitrol ointment TID x1 week followed by Refresh PM TID-QID for chronic lubrication; can tape eyelid closed if exposure is still an issue (plastic tape rather than paper tape and tape  eye directly rather than with gauze)   Pressure injury Medial perineum. Not present on admission.      Subjective: Difficult to assess  given deficits  Physical Exam: Vitals:   12/09/23 0329 12/09/23 0725 12/09/23 0836 12/09/23 1116  BP: (!) 144/99  128/83 (!) 146/89  Pulse: 70 80 76 78  Resp: 18 20  19   Temp: 98.7 F (37.1 C) 98.6 F (37 C)  98.6 F (37 C)  TempSrc: Oral Oral  Oral  SpO2: 100% 96%  98%  Weight:      Height:       General exam: Laying in bed, in no acute distress Respiratory system: normal chest rise, clear, no audible wheezing Cardiovascular system: regular rhythm, s1-s2 Gastrointestinal system: Nondistended, nontender, pos BS Central nervous system: No seizures, no tremors Extremities: No cyanosis, no joint deformities Skin: No rashes, no pallor Psychiatry: Difficult to assess given mentation  Data Reviewed:  There are no new results to review at this time.  Family Communication: Pt in room, family not at bedside  Disposition: Status is: Inpatient Remains inpatient appropriate because: severity of illness  Planned Discharge Destination: Skilled nursing facility    Author: Cherylle Corwin, MD 12/09/2023 1:03 PM  For on call review [email or web address].

## 2023-12-10 DIAGNOSIS — I619 Nontraumatic intracerebral hemorrhage, unspecified: Secondary | ICD-10-CM | POA: Diagnosis not present

## 2023-12-10 DIAGNOSIS — A419 Sepsis, unspecified organism: Secondary | ICD-10-CM | POA: Diagnosis not present

## 2023-12-10 LAB — GLUCOSE, CAPILLARY
Glucose-Capillary: 118 mg/dL — ABNORMAL HIGH (ref 70–99)
Glucose-Capillary: 120 mg/dL — ABNORMAL HIGH (ref 70–99)
Glucose-Capillary: 131 mg/dL — ABNORMAL HIGH (ref 70–99)
Glucose-Capillary: 133 mg/dL — ABNORMAL HIGH (ref 70–99)
Glucose-Capillary: 151 mg/dL — ABNORMAL HIGH (ref 70–99)
Glucose-Capillary: 98 mg/dL (ref 70–99)

## 2023-12-10 NOTE — Progress Notes (Signed)
 Progress Note   Patient: Garrett [name] [id number]:[id number] DOB: 1976-01-04 DOA: 10/27/2023     48 DOS: the patient was seen and examined on 12/10/2023   Brief hospital course: 48 y.o. male with a history of hypertension.  Patient presented secondary to being found down with agonal bleeding by his fiancee. 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 The hemorrhage was initially assessed with a CT scan, revealing a 4.1 cm acute lesion, and was followed up with repeat imaging showing a decrease in size over time, although subarachnoid hemorrhage was also noted on subsequent scans. The patient was managed for primary hypertension and hypertensive emergency, with Cleviprex  initiated in the ICU, which was later weaned off. Due to dysphagia, a PEG tube was placed on 3/24. The patient also experienced acute respiratory failure secondary to aspiration pneumonia, requiring intubation on 3/8 and subsequent tracheostomy on 3/22. MRSA pneumonia was identified in tracheal aspirates, and the patient was initially treated with Unasyn , later switched to Vancomycin , and transitioned to Linezolid  to complete the course. The patient's ileus resolved, and no new intracranial abnormalities were noted after the subacute phase of the hemorrhage. Tracheostomy care was provided by pulmonology, and Yupelri  was continued for respiratory management.    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 Code stroke CT head significant for an acute large 4.1 cm hemorrhage centered in the pons with intraventricular extension into the fourth ventricle with extension into the basal cisterns. Repeat CT (3/9) without significant change and repeat CT (3/18) significant for interval decreased size of subacute pontine hemorrhage. CT head  (3/29) significant for evolving subacute pontine hemorrhage with subarachnoid hemorrhage and no new intracranial abnormality. Transthoracic Echocardiogram significant for moderate concentric LVH with an LVEF of 60-65%. LDL of 96. Hemoglobin A1C of 4.7%. No antithrombotic secondary to intracerebral hemorrhage. -SNF placement in progress. Barrier to placement would be down grade from cuffed trach. PCCM following. Trach changed to uncuffed trach 4/21   Primary hypertension Hypertensive emergency Patient initial managed with Cleviprex  while in ICU which was weaned off. -Continue amlodipine  and metoprolol . BP remains stable and controlled   Dysphagia PEG tube placed on 3/24. Patient seen by speech therapy. -Continuing with tube feeding   Ileus Resolved.   Acute respiratory failure with hypoxia S/p tracheostomy Secondary to aspiration pneumonia. Patient required intubation on 3/8 and managed via mechanical ventilation. Patient converted to tracheostomy on 3/22. -Tracheostomy care per pulmonology. Trach changed to cuffless 4/21   Aspiration pneumonia MRSA pneumonia Patient treated empirically with Unasyn . Tracheal aspirate (4/1) significant for MRSA. Patient treated with Vancomycin  with prolonged treatment for increased secretions and persistently positive tracheal aspirate (4/8). Patient transitioned to Linezolid  to complete treatment. -Completed course of linezolid  on 4/15   Macrocytic anemia Stable.   Thrombocytosis Resolved.   Poor dention Noted.   AKI Resolved.   Acute urinary retention Foley catheter placed on 4/13.   Hypernatremia Resolved with fluids.   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 Patient seen by ophthalmology. -Ophthalmology recommendations (4/7): maxitrol ointment TID x1 week followed by Refresh PM TID-QID for chronic lubrication; can tape eyelid closed if exposure is still an issue (plastic tape rather than paper tape and tape eye directly rather than with gauze)    Pressure injury Medial perineum. Not present on admission.      Subjective: Difficult to assess given deficits  Physical Exam: Vitals:  12/10/23 0756 12/10/23 0906 12/10/23 0910 12/10/23 1325  BP: (!) 139/98     Pulse: 82 85    Resp: 17 18    Temp: 97.6 F (36.4 C)     TempSrc: Axillary     SpO2: 98% 98% 100% 97%  Weight:      Height:       General exam: Awake, laying in bed, in nad Respiratory system: Normal respiratory effort, no wheezing, trach in place Cardiovascular system: regular rate, s1, s2 Gastrointestinal system: Soft, nondistended, positive BS Central nervous system: CN2-12 grossly intact, strength intact Extremities: Perfused, no clubbing Skin: Normal skin turgor, no notable skin lesions seen Psychiatry: Difficult to assess given deficits  Data Reviewed:  There are no new results to review at this time.  Family Communication: Pt in room, family not at bedside  Disposition: Status is: Inpatient Remains inpatient appropriate because: severity of illness  Planned Discharge Destination: Skilled nursing facility    Author: Cherylle Corwin, MD 12/10/2023 3:23 PM  For on call review [email or web address].

## 2023-12-10 NOTE — Plan of Care (Signed)
 Problem: Education: Goal: Knowledge of disease or condition will improve Outcome: Progressing Goal: Knowledge of secondary prevention will improve (MUST DOCUMENT ALL) Outcome: Progressing Goal: Knowledge of patient specific risk factors will improve (DELETE if not current risk factor) Outcome: Progressing   Problem: Intracerebral Hemorrhage Tissue Perfusion: Goal: Complications of Intracerebral Hemorrhage will be minimized Outcome: Progressing   Problem: Coping: Goal: Will verbalize positive feelings about self Outcome: Progressing Goal: Will identify appropriate support needs Outcome: Progressing   Problem: Health Behavior/Discharge Planning: Goal: Ability to manage health-related needs will improve Outcome: Progressing Goal: Goals will be collaboratively established with patient/family Outcome: Progressing   Problem: Self-Care: Goal: Ability to participate in self-care as condition permits will improve Outcome: Progressing Goal: Verbalization of feelings and concerns over difficulty with self-care will improve Outcome: Progressing Goal: Ability to communicate needs accurately will improve Outcome: Progressing   Problem: Nutrition: Goal: Risk of aspiration will decrease Outcome: Progressing Goal: Dietary intake will improve Outcome: Progressing   Problem: Education: Goal: Ability to describe self-care measures that may prevent or decrease complications (Diabetes Survival Skills Education) will improve Outcome: Progressing Goal: Individualized Educational Video(s) Outcome: Progressing   Problem: Coping: Goal: Ability to adjust to condition or change in health will improve Outcome: Progressing   Problem: Fluid Volume: Goal: Ability to maintain a balanced intake and output will improve Outcome: Progressing   Problem: Health Behavior/Discharge Planning: Goal: Ability to identify and utilize available resources and services will improve Outcome: Progressing Goal:  Ability to manage health-related needs will improve Outcome: Progressing   Problem: Metabolic: Goal: Ability to maintain appropriate glucose levels will improve Outcome: Progressing   Problem: Nutritional: Goal: Maintenance of adequate nutrition will improve Outcome: Progressing Goal: Progress toward achieving an optimal weight will improve Outcome: Progressing   Problem: Skin Integrity: Goal: Risk for impaired skin integrity will decrease Outcome: Progressing   Problem: Tissue Perfusion: Goal: Adequacy of tissue perfusion will improve Outcome: Progressing   Problem: Education: Goal: Knowledge of General Education information will improve Description: Including pain rating scale, medication(s)/side effects and non-pharmacologic comfort measures Outcome: Progressing   Problem: Health Behavior/Discharge Planning: Goal: Ability to manage health-related needs will improve Outcome: Progressing   Problem: Clinical Measurements: Goal: Ability to maintain clinical measurements within normal limits will improve Outcome: Progressing Goal: Will remain free from infection Outcome: Progressing Goal: Diagnostic test results will improve Outcome: Progressing Goal: Respiratory complications will improve Outcome: Progressing Goal: Cardiovascular complication will be avoided Outcome: Progressing   Problem: Activity: Goal: Risk for activity intolerance will decrease Outcome: Progressing   Problem: Nutrition: Goal: Adequate nutrition will be maintained Outcome: Progressing   Problem: Coping: Goal: Level of anxiety will decrease Outcome: Progressing   Problem: Elimination: Goal: Will not experience complications related to bowel motility Outcome: Progressing Goal: Will not experience complications related to urinary retention Outcome: Progressing   Problem: Pain Managment: Goal: General experience of comfort will improve and/or be controlled Outcome: Progressing   Problem:  Safety: Goal: Ability to remain free from injury will improve Outcome: Progressing   Problem: Skin Integrity: Goal: Risk for impaired skin integrity will decrease Outcome: Progressing   Problem: Education: Goal: 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 Outcome: Progressing   Problem: Activity: Goal: Ability to tolerate increased activity will improve Outcome: Progressing   Problem: Health Behavior/Discharge Planning: Goal: Ability to manage tracheostomy will improve Outcome: Progressing   Problem: Respiratory: Goal: Patent airway maintenance will improve Outcome: Progressing   Problem: Role Relationship: Goal: Ability to communicate will improve Outcome: Progressing

## 2023-12-10 NOTE — Procedures (Addendum)
 Tracheostomy Change Note  Patient Details:   Name: Sheila Gervasi DOB: 03/22/1976 MRN: [id number]    Airway Documentation:     Evaluation  O2 sats: stable throughout Complications: No apparent complications Patient did tolerate procedure well. Bilateral Breath Sounds: Clear, Diminished    RT x 2 changed pt's trach to #8 XLT proximal CUFFLESS shiley per MD order. Positive color change was noted with trach insertion. No complications and very minimal blood with trach insertion. Pt is stable at this time. RT will monitor.   Ashten Prats 12/10/2023, 1:37 PM

## 2023-12-11 DIAGNOSIS — A419 Sepsis, unspecified organism: Secondary | ICD-10-CM | POA: Diagnosis not present

## 2023-12-11 DIAGNOSIS — I619 Nontraumatic intracerebral hemorrhage, unspecified: Secondary | ICD-10-CM | POA: Diagnosis not present

## 2023-12-11 LAB — CBC
HCT: 33.8 % — ABNORMAL LOW (ref 39.0–52.0)
Hemoglobin: 10.6 g/dL — ABNORMAL LOW (ref 13.0–17.0)
MCH: 31 pg (ref 26.0–34.0)
MCHC: 31.4 g/dL (ref 30.0–36.0)
MCV: 98.8 fL (ref 80.0–100.0)
Platelets: 423 10*3/uL — ABNORMAL HIGH (ref 150–400)
RBC: 3.42 MIL/uL — ABNORMAL LOW (ref 4.22–5.81)
RDW: 14 % (ref 11.5–15.5)
WBC: 9.9 10*3/uL (ref 4.0–10.5)
nRBC: 0 % (ref 0.0–0.2)

## 2023-12-11 LAB — BASIC METABOLIC PANEL WITH GFR
Anion gap: 11 (ref 5–15)
BUN: 22 mg/dL — ABNORMAL HIGH (ref 6–20)
CO2: 25 mmol/L (ref 22–32)
Calcium: 9.2 mg/dL (ref 8.9–10.3)
Chloride: 101 mmol/L (ref 98–111)
Creatinine, Ser: 0.8 mg/dL (ref 0.61–1.24)
GFR, Estimated: >60 mL/min (ref >60)
Glucose, Bld: 128 mg/dL — ABNORMAL HIGH (ref 70–99)
Potassium: 3.9 mmol/L (ref 3.5–5.1)
Sodium: 137 mmol/L (ref 135–145)

## 2023-12-11 LAB — FUNGUS CULTURE WITH STAIN

## 2023-12-11 LAB — GLUCOSE, CAPILLARY
Glucose-Capillary: 95 mg/dL (ref 70–99)
Glucose-Capillary: 159 mg/dL — ABNORMAL HIGH (ref 70–99)
Glucose-Capillary: 106 mg/dL — ABNORMAL HIGH (ref 70–99)
Glucose-Capillary: 132 mg/dL — ABNORMAL HIGH (ref 70–99)
Glucose-Capillary: 101 mg/dL — ABNORMAL HIGH (ref 70–99)
Glucose-Capillary: 96 mg/dL (ref 70–99)

## 2023-12-11 LAB — FUNGAL ORGANISM REFLEX

## 2023-12-11 LAB — FUNGUS CULTURE RESULT

## 2023-12-11 NOTE — Progress Notes (Signed)
 Physical Therapy Treatment Patient Details Name: Garrett [name] MRN: [id number] DOB: May 23, 1976 Today's Date: 12/11/2023   History of Present Illness Pt is a 48 yo male who was found down 10/27/23 with agonal breathing. 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 Intubated 3/8, cortrak placed 3/14, trach placed 3/22, PEG placed 3/24. PMH: HTN, smoker    PT Comments  The pt was alert with R eye open upon arrival of PT, tolerated transfer to new air mattress bed with assist of 3. He was then positioned in chair position and tolerated PROM of all extremities, but did not show any movement to commands this session. Pt tolerated PROM of his neck but continues to demo preference for L rotation and sidebending. With assist to achieve long-sitting, pt with no active control of head/neck position and needed totalA to manage trunk position. VSS with all changes in position.     If plan is discharge home, recommend the following: Two people to help with walking and/or transfers;Two people to help with bathing/dressing/bathroom;Assistance with cooking/housework;Assistance with feeding;Direct supervision/assist for medications management;Direct supervision/assist for financial management;Assist for transportation;Help with stairs or ramp for entrance;Supervision due to cognitive status   Can travel by private vehicle        Equipment Recommendations  [place] lift;Hospital bed;Wheelchair (measurements PT);Wheelchair cushion (measurements PT);BSC/3in1;Other (comment) (air mattress)    Recommendations for Other Services       Precautions / Restrictions Precautions Precautions: Fall;Other (comment) Recall of Precautions/Restrictions: Impaired Precaution/Restrictions Comments: trach, PEG, abdominal binder, SBP < 160 Required Braces or Orthoses:  Other Brace Other Brace: has bilateral prevalon boots Restrictions Weight Bearing Restrictions Per Provider Order: No     Mobility  Bed Mobility Overal bed mobility: Needs Assistance Bed Mobility: Rolling Rolling: Total assist         General bed mobility comments: TotalA for rolling to R/L for movement of bed sheets, totalA to attempt to longsit in bed from elevated HOB, pt unable to manage trunk or head/neck control    Transfers                   General transfer comment: assisted RN and NT to transfer pt to new bed, used lateral transfer without lift. would be dependent on lift for OOB transfer    Ambulation/Gait               General Gait Details: deferred, pt not following cues currently or able to support himself in sitting   Stairs             Wheelchair Mobility     Tilt Bed    Modified Rankin (Stroke Patients Only) Modified Rankin (Stroke Patients Only) Pre-Morbid Rankin Score: No symptoms Modified Rankin: Severe disability     Balance Overall balance assessment: Needs assistance Sitting-balance support: No upper extremity supported, Feet supported Sitting balance-Leahy Scale: Zero Sitting balance - Comments: Pt requiring total assist to lift his trunk off elevated HOB, no head/neck control when not supported. Postural control: Posterior lean     Standing balance comment: deferred, pt not following cues currently or able to support himself in sitting                            Communication Communication Communication: Impaired Factors Affecting Communication: Trach/intubated  Cognition Arousal: Alert Behavior During Therapy:  Flat affect   PT - Cognitive impairments: Difficult to assess Difficult to assess due to: Tracheostomy, Impaired communication                     PT - Cognition Comments: pt awake with eyes open, visually attends to therapist when directly infront of him, not scanning to R to  visually track. did track vertically at midline. no attempt to follow commands this session, spontaneous movement to RLE with bed mobility, seemed reflexive Following commands: Impaired Following commands impaired:  (not following commands)    Cueing Cueing Techniques: Verbal cues, Tactile cues, Visual cues  Exercises General Exercises - Lower Extremity Ankle Circles/Pumps: PROM, Both, 10 reps, Supine Heel Slides: PROM, Both, 10 reps, Supine Hip ABduction/ADduction: PROM, Both, 10 reps, Supine (increased resistance with RLE abduction) Other Exercises Other Exercises: cervical rotation and lateral flexion to R, positioned towel roll to keep pt midline end of session Other Exercises: attempted AAROM of extremities but no command folloiwng noted, largely tolerates PROM well. slightly increased resistance in RLE, especially at hip    General Comments General comments (skin integrity, edema, etc.): VSS on trach collar, pt coughing up bloody sputum      Pertinent Vitals/Pain Pain Assessment Pain Assessment: Faces Pain Score: 0-No pain Faces Pain Scale: No hurt Pain Location: no grimacing with mobility today Pain Intervention(s): Monitored during session    Home Living                          Prior Function            PT Goals (current goals can now be found in the care plan section) Acute Rehab PT Goals Patient Stated Goal: unable to state PT Goal Formulation: With family Time For Goal Achievement: 12/27/23 Potential to Achieve Goals: Fair Progress towards PT goals: Progressing toward goals    Frequency    Min 2X/week       AM-PAC PT "6 Clicks" Mobility   Outcome Measure  Help needed turning from your back to your side while in a flat bed without using bedrails?: Total Help needed moving from lying on your back to sitting on the side of a flat bed without using bedrails?: Total Help needed moving to and from a bed to a chair (including a wheelchair)?:  Total Help needed standing up from a chair using your arms (e.g., wheelchair or bedside chair)?: Total Help needed to walk in hospital room?: Total Help needed climbing 3-5 steps with a railing? : Total 6 Click Score: 6    End of Session   Activity Tolerance: Patient tolerated treatment well Patient left: with call bell/phone within reach;in bed;with bed alarm set Nurse Communication: Mobility status;Need for lift equipment PT Visit Diagnosis: Muscle weakness (generalized) (M62.81);Difficulty in walking, not elsewhere classified (R26.2);Other symptoms and signs involving the nervous system (R29.898);Hemiplegia and hemiparesis Hemiplegia - Right/Left: Right Hemiplegia - dominant/non-dominant: Dominant Hemiplegia - caused by: Nontraumatic intracerebral hemorrhage     Time: 1419-1445 PT Time Calculation (min) (ACUTE ONLY): 26 min  Charges:    $Therapeutic Exercise: 8-22 mins $Therapeutic Activity: 8-22 mins PT General Charges $$ ACUTE PT VISIT: 1 Visit                     Garrett [name], PT, DPT   Acute Rehabilitation Department Office [phone] Secure Chat Communication Preferred   Garrett [name] 12/11/2023, 2:55 PM

## 2023-12-11 NOTE — Plan of Care (Signed)
  Problem: Education: Goal: Knowledge of disease or condition will improve Outcome: Not Progressing   Problem: Coping: Goal: Will verbalize positive feelings about self Outcome: Not Progressing   Problem: Self-Care: Goal: Ability to participate in self-care as condition permits will improve Outcome: Not Progressing   Problem: Nutrition: Goal: Risk of aspiration will decrease Outcome: Not Progressing   Problem: Education: Goal: Ability to describe self-care measures that may prevent or decrease complications (Diabetes Survival Skills Education) will improve Outcome: Not Progressing

## 2023-12-11 NOTE — Progress Notes (Signed)
 Progress Note   Patient: Garrett [name] ZOX:[id number] DOB: February 10, 1976 DOA: 10/27/2023     45 DOS: the patient was seen and examined on 12/11/2023   Brief hospital course: 48 y.o. male with a history of hypertension.  Patient presented secondary to being found down with agonal bleeding by his fiancee. 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 The hemorrhage was initially assessed with a CT scan, revealing a 4.1 cm acute lesion, and was followed up with repeat imaging showing a decrease in size over time, although subarachnoid hemorrhage was also noted on subsequent scans. The patient was managed for primary hypertension and hypertensive emergency, with Cleviprex  initiated in the ICU, which was later weaned off. Due to dysphagia, a PEG tube was placed on 3/24. The patient also experienced acute respiratory failure secondary to aspiration pneumonia, requiring intubation on 3/8 and subsequent tracheostomy on 3/22. MRSA pneumonia was identified in tracheal aspirates, and the patient was initially treated with Unasyn , later switched to Vancomycin , and transitioned to Linezolid  to complete the course. The patient's ileus resolved, and no new intracranial abnormalities were noted after the subacute phase of the hemorrhage. Tracheostomy care was provided by pulmonology, and Yupelri  was continued for respiratory management.    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 Code stroke CT head significant for an acute large 4.1 cm hemorrhage centered in the pons with intraventricular extension into the fourth ventricle with extension into the basal cisterns. Repeat CT (3/9) without significant change and repeat CT (3/18) significant for interval decreased size of subacute pontine hemorrhage. CT head  (3/29) significant for evolving subacute pontine hemorrhage with subarachnoid hemorrhage and no new intracranial abnormality. Transthoracic Echocardiogram significant for moderate concentric LVH with an LVEF of 60-65%. LDL of 96. Hemoglobin A1C of 4.7%. No antithrombotic secondary to intracerebral hemorrhage. -SNF placement in progress. Trach changed to uncuffed trach 4/21. TOC following for placement   Primary hypertension Hypertensive emergency Patient initial managed with Cleviprex  while in ICU which was weaned off. -Continue amlodipine  and metoprolol .  -BP now stable   Dysphagia PEG tube placed on 3/24. Patient seen by speech therapy. -Continuing with tube feeding   Ileus Resolved.   Acute respiratory failure with hypoxia S/p tracheostomy Secondary to aspiration pneumonia. Patient required intubation on 3/8 and managed via mechanical ventilation. Patient converted to tracheostomy on 3/22. -Tracheostomy care per pulmonology. Trach changed to cuffless 4/21   Aspiration pneumonia MRSA pneumonia Patient treated empirically with Unasyn . Tracheal aspirate (4/1) significant for MRSA. Patient treated with Vancomycin  with prolonged treatment for increased secretions and persistently positive tracheal aspirate (4/8). Patient transitioned to Linezolid  to complete treatment. -Completed course of linezolid  on 4/15   Macrocytic anemia Remains stable -Recheck CBC in AM   Thrombocytosis Resolved.   Poor dention Noted.   AKI Resolved.   Acute urinary retention Foley catheter placed on 4/13.   Hypernatremia Resolved with fluids.   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 Patient seen by ophthalmology. -Ophthalmology recommendations (4/7): maxitrol ointment TID x1 week followed by Refresh PM TID-QID for chronic lubrication; can tape eyelid closed if exposure is still an issue (plastic tape rather than paper tape and tape eye directly rather than with gauze)   Pressure injury Medial perineum.  Not present on admission.      Subjective:  Difficult to assess given deficits  Physical Exam: Vitals:   12/11/23  0500 12/11/23 0818 12/11/23 1212 12/11/23 1559  BP:  (!) 147/87 (!) 165/91 (!) 141/98  Pulse:  91 83   Resp:  20 (!) 21   Temp:  98.7 F (37.1 C) 98.2 F (36.8 C) 98.7 F (37.1 C)  TempSrc:  Oral Oral Axillary  SpO2:  99% 97%   Weight: 99.6 kg     Height:       General exam: Not conversant, in no acute distress Respiratory system: normal chest rise, clear, no audible wheezing Cardiovascular system: regular rhythm, s1-s2 Gastrointestinal system: Nondistended, nontender, pos BS Central nervous system: No seizures, no tremors Extremities: No cyanosis, no joint deformities Skin: No rashes, no pallor Psychiatry: Difficult to assess given deficits  Data Reviewed:  There are no new results to review at this time.  Family Communication: Pt in room, family not at bedside  Disposition: Status is: Inpatient Remains inpatient appropriate because: severity of illness  Planned Discharge Destination: Skilled nursing facility    Author: Cherylle Corwin, MD 12/11/2023 4:42 PM  For on call review [email or web address].

## 2023-12-12 DIAGNOSIS — I613 Nontraumatic intracerebral hemorrhage in brain stem: Secondary | ICD-10-CM

## 2023-12-12 LAB — GLUCOSE, CAPILLARY
Glucose-Capillary: 115 mg/dL — ABNORMAL HIGH (ref 70–99)
Glucose-Capillary: 119 mg/dL — ABNORMAL HIGH (ref 70–99)
Glucose-Capillary: 120 mg/dL — ABNORMAL HIGH (ref 70–99)
Glucose-Capillary: 128 mg/dL — ABNORMAL HIGH (ref 70–99)
Glucose-Capillary: 131 mg/dL — ABNORMAL HIGH (ref 70–99)
Glucose-Capillary: 96 mg/dL (ref 70–99)

## 2023-12-12 MED ORDER — FENTANYL CITRATE PF 50 MCG/ML IJ SOSY: 12.5000 ug | PREFILLED_SYRINGE | INTRAMUSCULAR | Status: DC | PRN

## 2023-12-12 NOTE — TOC Progression Note (Signed)
 Transition of Care [facility]) - Progression Note    Patient Details  Name: Garrett [name] MRN: [id number] Date of Birth: [birth year]  Transition of Care [facility]) CM/SW Contact  Valery Gaucher, [name] Phone Number: 12/12/2023, 3:48 PM  Clinical Narrative:     [facility] sent message to [facility], inquired about the status of Medicaid and disability- waiting on response.   Patient will need disability before for can d/c to a SNF.   TOC will continue to follow and assist with discharge planning.   Liddie Reel, MSW, LCSW Clinical Social Worker    Expected Discharge Plan: Long Term Nursing Home Barriers to Discharge: Continued Medical Work up, SNF Pending Medicaid, SNF Pending bed offer, SNF Pending payor source - LOG, Inadequate or no insurance (New trach)  Expected Discharge Plan and Services In-house Referral: Clinical Social Work, Hospice / Palliative Care   Post Acute Care Choice: Skilled Nursing Facility Living arrangements for the past 2 months: Single Family Home                                       Social Determinants of Health (SDOH) Interventions SDOH Screenings   Food Insecurity: Patient Unable To Answer (10/29/2023)  Social Connections: Unknown (06/23/2023)   Received from [facility]  Tobacco Use: High Risk (10/31/2023)    Readmission Risk Interventions     No data to display

## 2023-12-12 NOTE — Plan of Care (Signed)
 Problem: Education: Goal: Knowledge of disease or condition will improve 12/12/2023 2059 by Alejos Husband, RN Outcome: Progressing 12/12/2023 2014 by Alejos Husband, RN Outcome: Progressing 12/12/2023 1954 by Alejos Husband, RN Outcome: Progressing Goal: Knowledge of secondary prevention will improve (MUST DOCUMENT ALL) 12/12/2023 2059 by Alejos Husband, RN Outcome: Progressing 12/12/2023 2014 by Alejos Husband, RN Outcome: Progressing 12/12/2023 1954 by Alejos Husband, RN Outcome: Progressing Goal: Knowledge of patient specific risk factors will improve (DELETE if not current risk factor) 12/12/2023 2059 by Alejos Husband, RN Outcome: Progressing 12/12/2023 2014 by Alejos Husband, RN Outcome: Progressing 12/12/2023 1954 by Alejos Husband, RN Outcome: Progressing   Problem: Intracerebral Hemorrhage Tissue Perfusion: Goal: Complications of Intracerebral Hemorrhage will be minimized 12/12/2023 2059 by Alejos Husband, RN Outcome: Progressing 12/12/2023 2014 by Alejos Husband, RN Outcome: Progressing 12/12/2023 1954 by Alejos Husband, RN Outcome: Progressing   Problem: Coping: Goal: Will verbalize positive feelings about self 12/12/2023 2059 by Alejos Husband, RN Outcome: Progressing 12/12/2023 2014 by Alejos Husband, RN Outcome: Progressing 12/12/2023 1954 by Alejos Husband, RN Outcome: Progressing Goal: Will identify appropriate support needs 12/12/2023 2059 by Alejos Husband, RN Outcome: Progressing 12/12/2023 2014 by Alejos Husband, RN Outcome: Progressing 12/12/2023 1954 by Alejos Husband, RN Outcome: Progressing   Problem: Health Behavior/Discharge Planning: Goal: Ability to manage health-related needs will improve 12/12/2023 2059 by Alejos Husband, RN Outcome: Progressing 12/12/2023 2014 by Alejos Husband, RN Outcome: Progressing 12/12/2023 1954 by Alejos Husband, RN Outcome: Progressing Goal:  Goals will be collaboratively established with patient/family 12/12/2023 2059 by Alejos Husband, RN Outcome: Progressing 12/12/2023 2014 by Alejos Husband, RN Outcome: Progressing 12/12/2023 1954 by Alejos Husband, RN Outcome: Progressing   Problem: Self-Care: Goal: Ability to participate in self-care as condition permits will improve 12/12/2023 2059 by Alejos Husband, RN Outcome: Progressing 12/12/2023 2014 by Alejos Husband, RN Outcome: Progressing 12/12/2023 1954 by Alejos Husband, RN Outcome: Progressing Goal: Verbalization of feelings and concerns over difficulty with self-care will improve 12/12/2023 2059 by Alejos Husband, RN Outcome: Progressing 12/12/2023 2014 by Alejos Husband, RN Outcome: Progressing 12/12/2023 1954 by Alejos Husband, RN Outcome: Progressing Goal: Ability to communicate needs accurately will improve 12/12/2023 2059 by Alejos Husband, RN Outcome: Progressing 12/12/2023 2014 by Alejos Husband, RN Outcome: Progressing 12/12/2023 1954 by Alejos Husband, RN Outcome: Progressing   Problem: Nutrition: Goal: Risk of aspiration will decrease 12/12/2023 2059 by Alejos Husband, RN Outcome: Progressing 12/12/2023 2014 by Alejos Husband, RN Outcome: Progressing 12/12/2023 1954 by Alejos Husband, RN Outcome: Progressing Goal: Dietary intake will improve 12/12/2023 2059 by Alejos Husband, RN Outcome: Progressing 12/12/2023 2014 by Alejos Husband, RN Outcome: Progressing 12/12/2023 1954 by Alejos Husband, RN Outcome: Progressing   Problem: Education: Goal: Ability to describe self-care measures that may prevent or decrease complications (Diabetes Survival Skills Education) will improve 12/12/2023 2059 by Alejos Husband, RN Outcome: Progressing 12/12/2023 2014 by Alejos Husband, RN Outcome: Progressing 12/12/2023 1954 by Alejos Husband, RN Outcome: Progressing   Problem: Coping: Goal:  Ability to adjust to condition or change in health will improve 12/12/2023 2059 by Alejos Husband, RN Outcome: Progressing 12/12/2023 2014 by Alejos Husband, RN Outcome: Progressing 12/12/2023 1954 by Alejos Husband, RN Outcome: Progressing   Problem: Fluid Volume: Goal: Ability to maintain a balanced intake and output will improve  12/12/2023 2059 by Alejos Husband, RN Outcome: Progressing 12/12/2023 2014 by Alejos Husband, RN Outcome: Progressing 12/12/2023 1954 by Alejos Husband, RN Outcome: Progressing   Problem: Health Behavior/Discharge Planning: Goal: Ability to identify and utilize available resources and services will improve 12/12/2023 2059 by Alejos Husband, RN Outcome: Progressing 12/12/2023 2014 by Alejos Husband, RN Outcome: Progressing 12/12/2023 1954 by Alejos Husband, RN Outcome: Progressing Goal: Ability to manage health-related needs will improve 12/12/2023 2059 by Alejos Husband, RN Outcome: Progressing 12/12/2023 2014 by Alejos Husband, RN Outcome: Progressing 12/12/2023 1954 by Alejos Husband, RN Outcome: Progressing   Problem: Metabolic: Goal: Ability to maintain appropriate glucose levels will improve 12/12/2023 2059 by Alejos Husband, RN Outcome: Progressing 12/12/2023 2014 by Alejos Husband, RN Outcome: Progressing 12/12/2023 1954 by Alejos Husband, RN Outcome: Progressing   Problem: Nutritional: Goal: Maintenance of adequate nutrition will improve 12/12/2023 2059 by Alejos Husband, RN Outcome: Progressing 12/12/2023 2014 by Alejos Husband, RN Outcome: Progressing 12/12/2023 1954 by Alejos Husband, RN Outcome: Progressing Goal: Progress toward achieving an optimal weight will improve 12/12/2023 2059 by Alejos Husband, RN Outcome: Progressing 12/12/2023 2014 by Alejos Husband, RN Outcome: Progressing 12/12/2023 1954 by Alejos Husband, RN Outcome: Progressing   Problem: Skin  Integrity: Goal: Risk for impaired skin integrity will decrease 12/12/2023 2059 by Alejos Husband, RN Outcome: Progressing 12/12/2023 2014 by Alejos Husband, RN Outcome: Progressing 12/12/2023 1954 by Alejos Husband, RN Outcome: Progressing   Problem: Tissue Perfusion: Goal: Adequacy of tissue perfusion will improve 12/12/2023 2059 by Alejos Husband, RN Outcome: Progressing 12/12/2023 2014 by Alejos Husband, RN Outcome: Progressing 12/12/2023 1954 by Alejos Husband, RN Outcome: Progressing   Problem: Education: Goal: Knowledge of General Education information will improve Description: Including pain rating scale, medication(s)/side effects and non-pharmacologic comfort measures 12/12/2023 2059 by Alejos Husband, RN Outcome: Progressing 12/12/2023 2014 by Alejos Husband, RN Outcome: Progressing 12/12/2023 1954 by Alejos Husband, RN Outcome: Progressing   Problem: Health Behavior/Discharge Planning: Goal: Ability to manage health-related needs will improve 12/12/2023 2059 by Alejos Husband, RN Outcome: Progressing 12/12/2023 2014 by Alejos Husband, RN Outcome: Progressing 12/12/2023 1954 by Alejos Husband, RN Outcome: Progressing   Problem: Clinical Measurements: Goal: Ability to maintain clinical measurements within normal limits will improve 12/12/2023 2059 by Alejos Husband, RN Outcome: Progressing 12/12/2023 2014 by Alejos Husband, RN Outcome: Progressing 12/12/2023 1954 by Alejos Husband, RN Outcome: Progressing Goal: Will remain free from infection 12/12/2023 2059 by Alejos Husband, RN Outcome: Progressing 12/12/2023 2014 by Alejos Husband, RN Outcome: Progressing 12/12/2023 1954 by Alejos Husband, RN Outcome: Progressing Goal: Diagnostic test results will improve 12/12/2023 2059 by Alejos Husband, RN Outcome: Progressing 12/12/2023 2014 by Alejos Husband, RN Outcome: Progressing 12/12/2023 1954 by  Alejos Husband, RN Outcome: Progressing Goal: Respiratory complications will improve 12/12/2023 2059 by Alejos Husband, RN Outcome: Progressing 12/12/2023 2014 by Alejos Husband, RN Outcome: Progressing 12/12/2023 1954 by Alejos Husband, RN Outcome: Progressing Goal: Cardiovascular complication will be avoided 12/12/2023 2059 by Alejos Husband, RN Outcome: Progressing 12/12/2023 2014 by Alejos Husband, RN Outcome: Progressing 12/12/2023 1954 by Alejos Husband, RN Outcome: Progressing   Problem: Activity: Goal: Risk for activity intolerance will decrease 12/12/2023 2059 by Alejos Husband, RN Outcome: Progressing 12/12/2023 2014 by Alejos Husband, RN Outcome: Progressing 12/12/2023 1954 by Darleene Ege  Dyanna Glasgow, RN Outcome: Progressing   Problem: Nutrition: Goal: Adequate nutrition will be maintained 12/12/2023 2059 by Alejos Husband, RN Outcome: Progressing 12/12/2023 2014 by Alejos Husband, RN Outcome: Progressing 12/12/2023 1954 by Alejos Husband, RN Outcome: Progressing   Problem: Coping: Goal: Level of anxiety will decrease 12/12/2023 2059 by Alejos Husband, RN Outcome: Progressing 12/12/2023 2014 by Alejos Husband, RN Outcome: Progressing 12/12/2023 1954 by Alejos Husband, RN Outcome: Progressing   Problem: Elimination: Goal: Will not experience complications related to bowel motility 12/12/2023 2059 by Alejos Husband, RN Outcome: Progressing 12/12/2023 2014 by Alejos Husband, RN Outcome: Progressing 12/12/2023 1954 by Alejos Husband, RN Outcome: Progressing Goal: Will not experience complications related to urinary retention 12/12/2023 2059 by Alejos Husband, RN Outcome: Progressing 12/12/2023 2014 by Alejos Husband, RN Outcome: Progressing 12/12/2023 1954 by Alejos Husband, RN Outcome: Progressing   Problem: Pain Managment: Goal: General experience of comfort will improve and/or be  controlled 12/12/2023 2059 by Alejos Husband, RN Outcome: Progressing 12/12/2023 2014 by Alejos Husband, RN Outcome: Progressing 12/12/2023 1954 by Alejos Husband, RN Outcome: Progressing   Problem: Safety: Goal: Ability to remain free from injury will improve 12/12/2023 2059 by Alejos Husband, RN Outcome: Progressing 12/12/2023 2014 by Alejos Husband, RN Outcome: Progressing 12/12/2023 1954 by Alejos Husband, RN Outcome: Progressing   Problem: Skin Integrity: Goal: Risk for impaired skin integrity will decrease 12/12/2023 2059 by Alejos Husband, RN Outcome: Progressing 12/12/2023 2014 by Alejos Husband, RN Outcome: Progressing 12/12/2023 1954 by Alejos Husband, RN Outcome: Progressing   Problem: Education: Goal: 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 12/12/2023 2059 by Alejos Husband, RN Outcome: Progressing 12/12/2023 2014 by Alejos Husband, RN Outcome: Progressing 12/12/2023 1954 by Alejos Husband, RN Outcome: Progressing   Problem: Activity: Goal: Ability to tolerate increased activity will improve 12/12/2023 2059 by Alejos Husband, RN Outcome: Progressing 12/12/2023 2014 by Alejos Husband, RN Outcome: Progressing 12/12/2023 1954 by Alejos Husband, RN Outcome: Progressing   Problem: Health Behavior/Discharge Planning: Goal: Ability to manage tracheostomy will improve 12/12/2023 2059 by Alejos Husband, RN Outcome: Progressing 12/12/2023 2014 by Alejos Husband, RN Outcome: Progressing 12/12/2023 1954 by Alejos Husband, RN Outcome: Progressing   Problem: Respiratory: Goal: Patent airway maintenance will improve 12/12/2023 2059 by Alejos Husband, RN Outcome: Progressing 12/12/2023 2014 by Alejos Husband, RN Outcome: Progressing 12/12/2023 1954 by Alejos Husband, RN Outcome: Progressing   Problem: Role Relationship: Goal: Ability to communicate will improve 12/12/2023 2059  by Alejos Husband, RN Outcome: Progressing 12/12/2023 2014 by Alejos Husband, RN Outcome: Progressing 12/12/2023 1954 by Alejos Husband, RN Outcome: Progressing

## 2023-12-12 NOTE — Progress Notes (Signed)
 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 Catastrophic-repeat scan 3/9,3/18 and 3/29 with decreased size-etiology likely secondary to uncontrolled blood pressure Echo EF 60-65% A1c 4.7 LDL 96  PEG tube placed as above unable to graduate feeds orally will need PEG tube for long-term On modafinil  100 Change fentanyl  to 12.5 x1 hourly-lets see if he wakes up Stress prophylaxis with Pepcid  20 per tube--splint care second [place] to OT to prevent contractures as per them  Acute respiratory failure secondary to inability to protect airway Trach  Place 3/22changed to cuffless 4/21- tracheotomy care per pulmonary  Aspiration pneumonia Treated for MRSA pneumonia with vancomycin  from 3/24 through 4/15 Remains high risk for developing pneumonia--- continue Osmolite 50 cc/H, free water  100 cc every 6   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 Recommendations from ophthalmology 4/7-(4/7): maxitrol ointment TID x1 week followed by Refresh PM TID-QID for chronic lubrication; can tape eyelid closed if exposure is still an issue (plastic tape rather than paper tape and tape eye directly rather than with gauze)   AKI earlier in hospital stay with acute urinary retention Foley placed 4/13 will need to  continue  Macrocytic anemia thrombocytosis Overall stable/resolved  Impaired glucose tolerance Sugars below 180 would hold any insulins at this time  Perineal skin injury Continue Gerhard's Butt cream  Prior ileus  DVT prophylaxis: SCD  Status is: Inpatient Remains inpatient appropriate because:   Requires further disposition planning      Subjective: Tracks with right eye left eye covered with gauze Tracheotomy in place PEG tube seems in place Nursing reports is unchanged  Objective + exam Vitals:   12/12/23 0752 12/12/23 0921 12/12/23 1134 12/12/23 1159  BP: (!) 137/103 125/88 (!) 155/105 136/85  Pulse: 78 82 78 77  Resp: (!) 22  (!) 25 16  Temp: 98.6 F (37 C)  99.2 F (37.3 C)   TempSrc: Axillary  Axillary   SpO2: 100% 100% 99% 97%  Weight:      Height:       Filed Weights   12/10/23 0455 12/11/23 0500 12/12/23 0500  Weight: 99.6 kg 99.6 kg 99.6 kg    Examination: External ocular movements intact to right eye--left eye seems quite injected S1-S2 no murmur Abdomen soft no rebound PEG in place Cannot obtain ROS difficult exam neurologically Prevalon boots in place  Data Reviewed: reviewed   CBC    Component Value Date/Time   WBC 9.9 12/11/2023 0843   RBC 3.42 (L) 12/11/2023 0843   HGB 10.6 (L) 12/11/2023 0843   HCT 33.8 (L) 12/11/2023 0843   PLT 423 (H) 12/11/2023 0843   MCV 98.8 12/11/2023 0843   MCH 31.0 12/11/2023 0843   MCHC 31.4 12/11/2023 0843   RDW 14.0 12/11/2023 0843   LYMPHSABS 2.6 11/20/2023 0644   MONOABS 1.4 (H) 11/20/2023 0644   EOSABS 0.1  11/20/2023 0644   BASOSABS 0.0 11/20/2023 0644      Latest Ref Rng & Units 12/11/2023    8:43 AM 12/07/2023    8:57 AM 12/04/2023    8:51 AM  CMP  Glucose 70 - 99 mg/dL 161  096  045   BUN 6 - 20 mg/dL 22  21  19    Creatinine 0.61 - 1.24 mg/dL 4.09  8.11  9.14   Sodium 135 - 145 mmol/L 137  137  136   Potassium 3.5 - 5.1 mmol/L 3.9  4.0  3.9   Chloride 98 - 111 mmol/L 101  101  102    CO2 22 - 32 mmol/L 25  26  25    Calcium 8.9 - 10.3 mg/dL 9.2  9.1  9.1     Scheduled Meds:  amLODipine   10 mg Per Tube Daily   artificial tears   Left Eye Q8H   Chlorhexidine  Gluconate Cloth  6 each Topical Daily   enoxaparin  (LOVENOX ) injection  50 mg Subcutaneous Daily   famotidine   20 mg Per Tube BID   feeding supplement (PROSource TF20)  60 mL Per Tube TID   free water   100 mL Per Tube Q6H   insulin  aspart  0-15 Units Subcutaneous Q4H   metoprolol  tartrate  100 mg Per Tube BID   modafinil   100 mg Per Tube Daily   multivitamin with minerals  1 tablet Per Tube Daily   nutrition supplement (JUVEN)  1 packet Per Tube BID BM   mouth rinse  15 mL Mouth Rinse 4 times per day   Continuous Infusions:  feeding supplement (OSMOLITE 1.5 CAL) 1,000 mL (12/12/23 1302)    Time  44  [name], MD  [facility]

## 2023-12-12 NOTE — Plan of Care (Signed)
 Problem: Education: Goal: Knowledge of disease or condition will improve Outcome: Not Progressing Goal: Knowledge of secondary prevention will improve (MUST DOCUMENT ALL) Outcome: Not Progressing Goal: Knowledge of patient specific risk factors will improve (DELETE if not current risk factor) Outcome: Not Progressing   Problem: Intracerebral Hemorrhage Tissue Perfusion: Goal: Complications of Intracerebral Hemorrhage will be minimized Outcome: Progressing   Problem: Coping: Goal: Will verbalize positive feelings about self Outcome: Progressing Goal: Will identify appropriate support needs Outcome: Not Progressing   Problem: Health Behavior/Discharge Planning: Goal: Ability to manage health-related needs will improve Outcome: Progressing Goal: Goals will be collaboratively established with patient/family Outcome: Progressing   Problem: Self-Care: Goal: Ability to participate in self-care as condition permits will improve Outcome: Not Progressing Goal: Verbalization of feelings and concerns over difficulty with self-care will improve Outcome: Not Progressing Goal: Ability to communicate needs accurately will improve Outcome: Not Progressing   Problem: Nutrition: Goal: Risk of aspiration will decrease Outcome: Progressing Goal: Dietary intake will improve Outcome: Progressing   Problem: Education: Goal: Ability to describe self-care measures that may prevent or decrease complications (Diabetes Survival Skills Education) will improve Outcome: Not Progressing Goal: Individualized Educational Video(s) Outcome: Not Applicable   Problem: Coping: Goal: Ability to adjust to condition or change in health will improve Outcome: Progressing   Problem: Fluid Volume: Goal: Ability to maintain a balanced intake and output will improve Outcome: Progressing   Problem: Health Behavior/Discharge Planning: Goal: Ability to identify and utilize available resources and services will  improve Outcome: Not Progressing Goal: Ability to manage health-related needs will improve Outcome: Progressing   Problem: Metabolic: Goal: Ability to maintain appropriate glucose levels will improve Outcome: Progressing   Problem: Nutritional: Goal: Maintenance of adequate nutrition will improve Outcome: Progressing Goal: Progress toward achieving an optimal weight will improve Outcome: Progressing   Problem: Skin Integrity: Goal: Risk for impaired skin integrity will decrease Outcome: Progressing   Problem: Tissue Perfusion: Goal: Adequacy of tissue perfusion will improve Outcome: Progressing   Problem: Education: Goal: Knowledge of General Education information will improve Description: Including pain rating scale, medication(s)/side effects and non-pharmacologic comfort measures Outcome: Progressing   Problem: Health Behavior/Discharge Planning: Goal: Ability to manage health-related needs will improve Outcome: Progressing   Problem: Clinical Measurements: Goal: Ability to maintain clinical measurements within normal limits will improve Outcome: Progressing Goal: Will remain free from infection Outcome: Progressing Goal: Diagnostic test results will improve Outcome: Progressing Goal: Respiratory complications will improve Outcome: Progressing Goal: Cardiovascular complication will be avoided Outcome: Progressing   Problem: Activity: Goal: Risk for activity intolerance will decrease Outcome: Progressing   Problem: Nutrition: Goal: Adequate nutrition will be maintained Outcome: Progressing   Problem: Coping: Goal: Level of anxiety will decrease Outcome: Progressing   Problem: Elimination: Goal: Will not experience complications related to bowel motility Outcome: Progressing Goal: Will not experience complications related to urinary retention Outcome: Progressing   Problem: Pain Managment: Goal: General experience of comfort will improve and/or be  controlled Outcome: Progressing   Problem: Safety: Goal: Ability to remain free from injury will improve Outcome: Progressing   Problem: Skin Integrity: Goal: Risk for impaired skin integrity will decrease Outcome: Progressing   Problem: Education: Goal: 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 Outcome: Not Progressing   Problem: Activity: Goal: Ability to tolerate increased activity will improve Outcome: Progressing   Problem: Health Behavior/Discharge Planning: Goal: Ability to manage tracheostomy will improve Outcome: Not Progressing   Problem: Respiratory: Goal: Patent airway maintenance will improve Outcome: Progressing   Problem:  Role Relationship: Goal: Ability to communicate will improve Outcome: Not Progressing

## 2023-12-12 NOTE — Plan of Care (Signed)
 Problem: Education: Goal: Knowledge of disease or condition will improve 12/12/2023 0417 by Alejos Husband, RN Outcome: Progressing 12/11/2023 2100 by Alejos Husband, RN Outcome: Progressing Goal: Knowledge of secondary prevention will improve (MUST DOCUMENT ALL) 12/12/2023 0417 by Alejos Husband, RN Outcome: Progressing 12/11/2023 2100 by Alejos Husband, RN Outcome: Progressing Goal: Knowledge of patient specific risk factors will improve (DELETE if not current risk factor) 12/12/2023 0417 by Alejos Husband, RN Outcome: Progressing 12/11/2023 2100 by Alejos Husband, RN Outcome: Progressing   Problem: Intracerebral Hemorrhage Tissue Perfusion: Goal: Complications of Intracerebral Hemorrhage will be minimized 12/12/2023 0417 by Alejos Husband, RN Outcome: Progressing 12/11/2023 2100 by Alejos Husband, RN Outcome: Progressing   Problem: Coping: Goal: Will verbalize positive feelings about self 12/12/2023 0417 by Alejos Husband, RN Outcome: Progressing 12/11/2023 2100 by Alejos Husband, RN Outcome: Progressing Goal: Will identify appropriate support needs 12/12/2023 0417 by Alejos Husband, RN Outcome: Progressing 12/11/2023 2100 by Alejos Husband, RN Outcome: Progressing   Problem: Health Behavior/Discharge Planning: Goal: Ability to manage health-related needs will improve 12/12/2023 0417 by Alejos Husband, RN Outcome: Progressing 12/11/2023 2100 by Alejos Husband, RN Outcome: Progressing Goal: Goals will be collaboratively established with patient/family 12/12/2023 0417 by Alejos Husband, RN Outcome: Progressing 12/11/2023 2100 by Alejos Husband, RN Outcome: Progressing   Problem: Self-Care: Goal: Ability to participate in self-care as condition permits will improve 12/12/2023 0417 by Alejos Husband, RN Outcome: Progressing 12/11/2023 2100 by Alejos Husband, RN Outcome: Progressing Goal: Verbalization of  feelings and concerns over difficulty with self-care will improve 12/12/2023 0417 by Alejos Husband, RN Outcome: Progressing 12/11/2023 2100 by Alejos Husband, RN Outcome: Progressing Goal: Ability to communicate needs accurately will improve 12/12/2023 0417 by Alejos Husband, RN Outcome: Progressing 12/11/2023 2100 by Alejos Husband, RN Outcome: Progressing   Problem: Nutrition: Goal: Risk of aspiration will decrease 12/12/2023 0417 by Alejos Husband, RN Outcome: Progressing 12/11/2023 2100 by Alejos Husband, RN Outcome: Progressing Goal: Dietary intake will improve 12/12/2023 0417 by Alejos Husband, RN Outcome: Progressing 12/11/2023 2100 by Alejos Husband, RN Outcome: Progressing   Problem: Education: Goal: Ability to describe self-care measures that may prevent or decrease complications (Diabetes Survival Skills Education) will improve 12/12/2023 0417 by Alejos Husband, RN Outcome: Progressing 12/11/2023 2100 by Alejos Husband, RN Outcome: Progressing Goal: Individualized Educational Video(s) 12/12/2023 0417 by Alejos Husband, RN Outcome: Progressing 12/11/2023 2100 by Alejos Husband, RN Outcome: Progressing   Problem: Coping: Goal: Ability to adjust to condition or change in health will improve 12/12/2023 0417 by Alejos Husband, RN Outcome: Progressing 12/11/2023 2100 by Alejos Husband, RN Outcome: Progressing   Problem: Fluid Volume: Goal: Ability to maintain a balanced intake and output will improve 12/12/2023 0417 by Alejos Husband, RN Outcome: Progressing 12/11/2023 2100 by Alejos Husband, RN Outcome: Progressing   Problem: Health Behavior/Discharge Planning: Goal: Ability to identify and utilize available resources and services will improve 12/12/2023 0417 by Alejos Husband, RN Outcome: Progressing 12/11/2023 2100 by Alejos Husband, RN Outcome: Progressing Goal: Ability to manage health-related  needs will improve 12/12/2023 0417 by Alejos Husband, RN Outcome: Progressing 12/11/2023 2100 by Alejos Husband, RN Outcome: Progressing   Problem: Metabolic: Goal: Ability to maintain appropriate glucose levels will improve 12/12/2023 0417 by Alejos Husband, RN Outcome: Progressing 12/11/2023 2100 by Alejos Husband, RN Outcome: Progressing  Problem: Nutritional: Goal: Maintenance of adequate nutrition will improve 12/12/2023 0417 by Alejos Husband, RN Outcome: Progressing 12/11/2023 2100 by Alejos Husband, RN Outcome: Progressing Goal: Progress toward achieving an optimal weight will improve 12/12/2023 0417 by Alejos Husband, RN Outcome: Progressing 12/11/2023 2100 by Alejos Husband, RN Outcome: Progressing   Problem: Skin Integrity: Goal: Risk for impaired skin integrity will decrease 12/12/2023 0417 by Alejos Husband, RN Outcome: Progressing 12/11/2023 2100 by Alejos Husband, RN Outcome: Progressing   Problem: Tissue Perfusion: Goal: Adequacy of tissue perfusion will improve 12/12/2023 0417 by Alejos Husband, RN Outcome: Progressing 12/11/2023 2100 by Alejos Husband, RN Outcome: Progressing   Problem: Education: Goal: Knowledge of General Education information will improve Description: Including pain rating scale, medication(s)/side effects and non-pharmacologic comfort measures 12/12/2023 0417 by Alejos Husband, RN Outcome: Progressing 12/11/2023 2100 by Alejos Husband, RN Outcome: Progressing   Problem: Health Behavior/Discharge Planning: Goal: Ability to manage health-related needs will improve 12/12/2023 0417 by Alejos Husband, RN Outcome: Progressing 12/11/2023 2100 by Alejos Husband, RN Outcome: Progressing   Problem: Clinical Measurements: Goal: Ability to maintain clinical measurements within normal limits will improve 12/12/2023 0417 by Alejos Husband, RN Outcome: Progressing 12/11/2023 2100 by  Alejos Husband, RN Outcome: Progressing Goal: Will remain free from infection 12/12/2023 0417 by Alejos Husband, RN Outcome: Progressing 12/11/2023 2100 by Alejos Husband, RN Outcome: Progressing Goal: Diagnostic test results will improve 12/12/2023 0417 by Alejos Husband, RN Outcome: Progressing 12/11/2023 2100 by Alejos Husband, RN Outcome: Progressing Goal: Respiratory complications will improve 12/12/2023 0417 by Alejos Husband, RN Outcome: Progressing 12/11/2023 2100 by Alejos Husband, RN Outcome: Progressing Goal: Cardiovascular complication will be avoided 12/12/2023 0417 by Alejos Husband, RN Outcome: Progressing 12/11/2023 2100 by Alejos Husband, RN Outcome: Progressing   Problem: Activity: Goal: Risk for activity intolerance will decrease 12/12/2023 0417 by Alejos Husband, RN Outcome: Progressing 12/11/2023 2100 by Alejos Husband, RN Outcome: Progressing   Problem: Nutrition: Goal: Adequate nutrition will be maintained 12/12/2023 0417 by Alejos Husband, RN Outcome: Progressing 12/11/2023 2100 by Alejos Husband, RN Outcome: Progressing   Problem: Coping: Goal: Level of anxiety will decrease 12/12/2023 0417 by Alejos Husband, RN Outcome: Progressing 12/11/2023 2100 by Alejos Husband, RN Outcome: Progressing   Problem: Elimination: Goal: Will not experience complications related to bowel motility 12/12/2023 0417 by Alejos Husband, RN Outcome: Progressing 12/11/2023 2100 by Alejos Husband, RN Outcome: Progressing Goal: Will not experience complications related to urinary retention 12/12/2023 0417 by Alejos Husband, RN Outcome: Progressing 12/11/2023 2100 by Alejos Husband, RN Outcome: Progressing   Problem: Pain Managment: Goal: General experience of comfort will improve and/or be controlled 12/12/2023 0417 by Alejos Husband, RN Outcome: Progressing 12/11/2023 2100 by Alejos Husband,  RN Outcome: Progressing   Problem: Safety: Goal: Ability to remain free from injury will improve 12/12/2023 0417 by Alejos Husband, RN Outcome: Progressing 12/11/2023 2100 by Alejos Husband, RN Outcome: Progressing   Problem: Skin Integrity: Goal: Risk for impaired skin integrity will decrease 12/12/2023 0417 by Alejos Husband, RN Outcome: Progressing 12/11/2023 2100 by Alejos Husband, RN Outcome: Progressing   Problem: Education: Goal: 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 12/12/2023 0417 by Alejos Husband, RN Outcome: Progressing 12/11/2023 2100 by Alejos Husband, RN Outcome: Progressing   Problem: Activity: Goal: Ability to tolerate increased activity will improve 12/12/2023 0417  by Alejos Husband, RN Outcome: Progressing 12/11/2023 2100 by Alejos Husband, RN Outcome: Progressing   Problem: Health Behavior/Discharge Planning: Goal: Ability to manage tracheostomy will improve 12/12/2023 0417 by Alejos Husband, RN Outcome: Progressing 12/11/2023 2100 by Alejos Husband, RN Outcome: Progressing   Problem: Respiratory: Goal: Patent airway maintenance will improve 12/12/2023 0417 by Alejos Husband, RN Outcome: Progressing 12/11/2023 2100 by Alejos Husband, RN Outcome: Progressing   Problem: Role Relationship: Goal: Ability to communicate will improve 12/12/2023 0417 by Alejos Husband, RN Outcome: Progressing 12/11/2023 2100 by Alejos Husband, RN Outcome: Progressing

## 2023-12-12 NOTE — TOC Progression Note (Signed)
 Transition of Care [facility]) - Progression Note    Patient Details  Name: Garrett [name] MRN: [id number] Date of Birth: [birth year]  Transition of [facility]) CM/SW Contact  Valery Gaucher, [name] Phone Number: 12/12/2023, 4:38 PM  Clinical Narrative:     Email notice below from [facility] :  Medicaid expansion is already approved, disability will usually pend for 90-120 days since his app was taken in March you should look for updates around June or July  The best contacts for disability follow up are  His Medicaid caseworker  Ladora Piedmont [phone]  Disability determination services number is  [phone]  [facility] number is [phone]  Thank you    Expected Discharge Plan: Long Term Nursing Home Barriers to Discharge: Continued Medical Work up, SNF Pending Medicaid, SNF Pending bed offer, SNF Pending payor source - LOG, Inadequate or no insurance (New trach)  Expected Discharge Plan and Services In-house Referral: Clinical Social Work, Hospice / Palliative Care   Post Acute Care Choice: Skilled Nursing Facility Living arrangements for the past 2 months: Single Family Home                                       Social Determinants of Health (SDOH) Interventions SDOH Screenings   Food Insecurity: Patient Unable To Answer (10/29/2023)  Social Connections: Unknown (06/23/2023)   Received from [facility]  Tobacco Use: High Risk (10/31/2023)    Readmission Risk Interventions     No data to display

## 2023-12-13 DIAGNOSIS — I613 Nontraumatic intracerebral hemorrhage in brain stem: Secondary | ICD-10-CM | POA: Diagnosis not present

## 2023-12-13 LAB — GLUCOSE, CAPILLARY
Glucose-Capillary: 104 mg/dL — ABNORMAL HIGH (ref 70–99)
Glucose-Capillary: 104 mg/dL — ABNORMAL HIGH (ref 70–99)
Glucose-Capillary: 121 mg/dL — ABNORMAL HIGH (ref 70–99)
Glucose-Capillary: 124 mg/dL — ABNORMAL HIGH (ref 70–99)
Glucose-Capillary: 144 mg/dL — ABNORMAL HIGH (ref 70–99)
Glucose-Capillary: 144 mg/dL — ABNORMAL HIGH (ref 70–99)

## 2023-12-13 NOTE — Progress Notes (Signed)
 Nutrition Follow-up  DOCUMENTATION CODES:   Not applicable  INTERVENTION:   Continue tube feeds via PEG: - Osmolite 1.5 @ 50 mL/hr (1200 mL/day) - PROSource TF20 60 mL TID - Free water  flush 100 mL every 6 hours  Tube feeding regimen provides 2040 kcal, 135 grams of protein, and 914 ml of H2O.   Total free water  with flushes: 1314 mL  - Continue MVI with minerals daily per tube  - Continue 1 packet Juven BID per tube to support wound healing, each packet provides 95 calories, 2.5 grams of protein (collagen), and 9.8 grams of carbohydrate  NUTRITION DIAGNOSIS:   Inadequate oral intake related to inability to eat as evidenced by NPO status.  Ongoing, being addressed via tube feeds  GOAL:   Patient will meet greater than or equal to 90% of their needs  Met via tube feeds  MONITOR:   Labs, Weight trends, TF tolerance, Skin  REASON FOR ASSESSMENT:   Consult Enteral/tube feeding initiation and management  ASSESSMENT:   Pt with PMH of HTN, daily mariajuana, and smoker admitted after being found down at home with pontine hemorrhagic stroke and trace SAH. UDS positive for opioids. Noted aspiration PNA on admission.  3/08 - admitted with SAH, intubated 3/09 - adult TF protocol started 3/14 - s/p cortrak placement (tip gastric) 3/22 - s/p tracheostomy 3/24 - s/p PEG 3/26 - emesis, TF paused 3/28 - trickle TF starated 3/31 - TF off due to ileus 4/02 - trickle TF started  4/04 - TF back at goal 4/08 - episode of vomiting x 2, TF paused then resumed at trickle 4/11 - TF back at goal  Pt remains NPO with tube feeds infusing at goal rate via PEG. Weight of 99.6 kg from today appears to be copied over multiple times since 12/04/23.  Pt asleep at time of visit and did not awaken to RD voice. Discussed pt with RN who reports pt is tolerating current tube feeding regimen with no issues. Will continue current nutrition interventions.  Current TF: Osmolite 1.5 @ 50 mL/hr,  PROSource TF20 60 mL TID, free water  flushes 100 mL every 6 hours  Admit weight: 103 kg Current weight: 99.6 kg  Medications reviewed and include: pepcid , SSI every 4 hours, MVI with minerals daily, Juven BID  Labs reviewed. CBG's: 96-144 x 24 hours  UOP: 1350 mL x 24 hours  Diet Order:   Diet Order             Diet NPO time specified  Diet effective now                   EDUCATION NEEDS:   No education needs have been identified at this time  Skin:  Skin Assessment: Skin Integrity Issues: Stage II: perineum Other: non-pressure wound to left eye  Last BM:  12/12/23 large type 6  Height:   Ht Readings from Last 1 Encounters:  11/22/23 5\' 8"  (1.727 m)    Weight:   Wt Readings from Last 1 Encounters:  12/13/23 99.6 kg    Ideal Body Weight:  70 kg  BMI:  Body mass index is 33.38 kg/m.  Estimated Nutritional Needs:   Kcal:  2000-2200  Protein:  125-140 grams  Fluid:  >2 L/day    Ernestina Headland, MS, RD, LDN Registered Dietitian II Please see AMiON for contact information.

## 2023-12-13 NOTE — Progress Notes (Signed)
 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--Catastrophic injury repeat scan 3/9,3/18 and 3/29 with decreased size-etiology likely secondary to uncontrolled blood pressure Echo EF 60-65% A1c 4.7 LDL 96  PEG tube placed as above unable to graduate feeds orally will need PEG tube for long-term On modafinil  100 Stop fentanyl  in 1-2 d completely to facilitate wakefullness Stress prophylaxis with Pepcid  20 per tube--splint care per OT to prevent contractures as per them  Acute respiratory failure secondary to inability to protect airway Trach  Place 3/22changed to cuffless 4/21- tracheotomy care per pulmonary--will ask them to revisit   Aspiration pneumonia Treated for MRSA pneumonia with vancomycin  from 3/24 through 4/15 Remains high risk for developing pneumonia--- continue Osmolite 50 cc/H, free water  100 cc every 6   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 Recommendations from ophthalmology 4/7-(4/7): maxitrol ointment TID x1 week followed by Refresh PM TID-QID for chronic lubrication; can tape eyelid closed if exposure is still an issue (plastic tape rather than paper tape and tape eye directly rather than with gauze)  This is improved  AKI earlier in hospital stay with acute urinary retention Foley  placed 4/13 will need to continue Periodic labs only  Macrocytic anemia thrombocytosis Overall stable/resolved  Impaired glucose tolerance Sugars below 180 would hold any insulins at this time  Perineal skin injury Continue Garrett [name] Butt cream  Prior ileus  DVT prophylaxis: SCD  Status is: Inpatient Remains inpatient appropriate because:   Requires further disposition planning      Subjective:  Sitting in chair Read therapy note.  Has been outside.  Starting to be more responsive although very slow progress  Objective + exam Vitals:   12/13/23 0350 12/13/23 0500 12/13/23 0802 12/13/23 1123  BP:   (!) 145/92 (!) 126/95  Pulse: 77  88 80  Resp: 17  19 17   Temp:   98.9 F (37.2 C)   TempSrc:   Oral Oral  SpO2: 97%  100% 97%  Weight:  99.6 kg    Height:       Filed Weights   12/11/23 0500 12/12/23 0500 12/13/23 0500  Weight: 99.6 kg 99.6 kg 99.6 kg    Examination: External ocular movements intact to right eye--he doesn't blonk to yes/no and doesmnt seem to understand me S1-S2 no murmur Abdomen soft no rebound PEG in place Cannot obtain ROS difficult exam neurologically Prevalon boots removed.  Heels examined no wounds   Data Reviewed: reviewed   CBC    Component Value Date/Time   WBC 9.9 12/11/2023 0843   RBC 3.42 (L) 12/11/2023 0843   HGB 10.6 (L) 12/11/2023 0843   HCT 33.8 (L) 12/11/2023 0843   PLT 423 (H) 12/11/2023 0843   MCV 98.8 12/11/2023 0843   MCH 31.0 12/11/2023 0843   MCHC 31.4 12/11/2023 0843   RDW 14.0 12/11/2023 0843  LYMPHSABS 2.6 11/20/2023 0644   MONOABS 1.4 (H) 11/20/2023 0644   EOSABS 0.1 11/20/2023 0644   BASOSABS 0.0 11/20/2023 0644      Latest Ref Rng & Units 12/11/2023    8:43 AM 12/07/2023    8:57 AM 12/04/2023    8:51 AM  CMP  Glucose 70 - 99 mg/dL 664  403  474   BUN 6 - 20 mg/dL 22  21  19    Creatinine 0.61 - 1.24 mg/dL 2.59  5.63  8.75   Sodium 135 - 145 mmol/L 137  137  136   Potassium 3.5 - 5.1 mmol/L 3.9   4.0  3.9   Chloride 98 - 111 mmol/L 101  101  102   CO2 22 - 32 mmol/L 25  26  25    Calcium 8.9 - 10.3 mg/dL 9.2  9.1  9.1     Scheduled Meds:  amLODipine   10 mg Per Tube Daily   artificial tears   Left Eye Q8H   Chlorhexidine  Gluconate Cloth  6 each Topical Daily   enoxaparin  (LOVENOX ) injection  50 mg Subcutaneous Daily   famotidine   20 mg Per Tube BID   feeding supplement (PROSource TF20)  60 mL Per Tube TID   free water   100 mL Per Tube Q6H   insulin  aspart  0-15 Units Subcutaneous Q4H   metoprolol  tartrate  100 mg Per Tube BID   modafinil   100 mg Per Tube Daily   multivitamin with minerals  1 tablet Per Tube Daily   nutrition supplement (JUVEN)  1 packet Per Tube BID BM   mouth rinse  15 mL Mouth Rinse 4 times per day   Continuous Infusions:  feeding supplement (OSMOLITE 1.5 CAL) 1,000 mL (12/13/23 0949)    Time  44  [name], MD  [facility]

## 2023-12-13 NOTE — Progress Notes (Signed)
 Physical Therapy Treatment Patient Details Name: Garrett [name] MRN: [id number] DOB: July 27, 1976 Today's Date: 12/13/2023   History of Present Illness Pt is a 48 yo male who was found down 10/27/23 with agonal breathing. 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 Intubated 3/8, cortrak placed 3/14, trach placed 3/22, PEG placed 3/24. PMH: HTN, smoker    PT Comments  The pt is beginning to follow intermittent simple commands on his L side again, like wiggle his toes and lift his arm. He is still requiring total assist to roll and the maximove to dependently transfer him OOB to the chair. Took pt outside once received verbal clearance by MD in order to improve pt's mood and attention/arousal as able. Pt completed seated exercises while sitting outside. He continues to display a preference to keep his neck rotated and laterally flexed to the L, needing frequent stretching and repositioning during the session. Per family, pt has been able to communicate at times through blinking his R eye 1x for 'yes' and 2x for 'no'. This was observed a couple times during this session, but he is not always consistent. Will continue to follow acutely.   If plan is discharge home, recommend the following: Two people to help with walking and/or transfers;Two people to help with bathing/dressing/bathroom;Assistance with cooking/housework;Assistance with feeding;Direct supervision/assist for medications management;Direct supervision/assist for financial management;Assist for transportation;Help with stairs or ramp for entrance;Supervision due to cognitive status   Can travel by private vehicle        Equipment Recommendations  Soper lift;Hospital bed;Wheelchair (measurements PT);Wheelchair cushion (measurements PT);BSC/3in1;Other (comment) (air mattress)     Recommendations for Other Services       Precautions / Restrictions Precautions Precautions: Fall;Other (comment) Recall of Precautions/Restrictions: Impaired Precaution/Restrictions Comments: trach, PEG, abdominal binder, SBP < 160 Required Braces or Orthoses: Other Brace Other Brace: has bilateral prevalon boots Restrictions Weight Bearing Restrictions Per Provider Order: No     Mobility  Bed Mobility Overal bed mobility: Needs Assistance Bed Mobility: Rolling Rolling: Total assist         General bed mobility comments: Cued pt to bring L UE towards family on R side of bed, pt did move L UE when cued, but still required total assist to rotate trunk/hips to roll bil for hoyer lift pad placement    Transfers Overall transfer level: Needs assistance Equipment used: Ambulation equipment used Transfers: Bed to chair/wheelchair/BSC             General transfer comment: Maximove utilized to lift pt dependently OOB to the recliner Transfer via Lift Equipment: Maximove  Ambulation/Gait               General Gait Details: deferred   Stairs             Wheelchair Mobility     Tilt Bed    Modified Rankin (Stroke Patients Only) Modified Rankin (Stroke Patients Only) Pre-Morbid Rankin Score: No symptoms Modified Rankin: Severe disability     Balance Overall balance assessment: Needs assistance             Standing balance comment: deferred                            Communication Communication Communication: Impaired Factors Affecting Communication: Trach/intubated  Cognition Arousal: Alert Behavior During Therapy: Flat affect  PT - Cognitive impairments: Difficult to assess Difficult to assess due to: Tracheostomy, Impaired communication                     PT - Cognition Comments: Pt with L eye taped shut and R eye open majority of session. R eye roams vertically fairly constantly and rhythmically throughout. He  tends to look to the L but will look forward at times when cued to meet therapist's gaze/face. Pt did blink eyes (1 blink vs x2 blinks) to cuing intermittently during session. Per family, x1 blink means 'yes' and x2 means 'no'. He did intermittently follow cues to move his L extremities, often moving the L when cued to move the R though. Follows </= 10% of cues. Following commands: Impaired Following commands impaired: Follows one step commands inconsistently, Follows one step commands with increased time (follows a few cues intermittently)    Cueing Cueing Techniques: Verbal cues, Tactile cues, Visual cues, Gestural cues  Exercises General Exercises - Lower Extremity Quad Sets: AAROM, PROM, Strengthening, Both, 10 reps, Seated (reclined, intermittent activation on L, questionable trace activation on R intermittently) Heel Slides: PROM, Both, 10 reps, Seated (reclined) Other Exercises Other Exercises: cervical rotation and lateral flexion to R >10 reps during session with prolonged stretch intermittently, sitting in chair, positioned towel roll to keep pt midline end of session Other Exercises: wiggle toes on L sitting in recliner    General Comments General comments (skin integrity, edema, etc.): VSS on trach collar; took pt outside with family and NT (got clearance from Dr. Haywood Lisle 4/24) to try to improve mood and arousal/attention      Pertinent Vitals/Pain Pain Assessment Pain Assessment: Faces Faces Pain Scale: No hurt Pain Intervention(s): Monitored during session    Home Living                          Prior Function            PT Goals (current goals can now be found in the care plan section) Acute Rehab PT Goals Patient Stated Goal: unable to state PT Goal Formulation: With family Time For Goal Achievement: 12/27/23 Potential to Achieve Goals: Fair Progress towards PT goals: Progressing toward goals    Frequency    Min 2X/week      PT Plan       Co-evaluation              AM-PAC PT "6 Clicks" Mobility   Outcome Measure  Help needed turning from your back to your side while in a flat bed without using bedrails?: Total Help needed moving from lying on your back to sitting on the side of a flat bed without using bedrails?: Total Help needed moving to and from a bed to a chair (including a wheelchair)?: Total Help needed standing up from a chair using your arms (e.g., wheelchair or bedside chair)?: Total Help needed to walk in hospital room?: Total Help needed climbing 3-5 steps with a railing? : Total 6 Click Score: 6    End of Session Equipment Utilized During Treatment: Oxygen Activity Tolerance: Patient tolerated treatment well Patient left: with call bell/phone within reach;in chair;with chair alarm set;with family/visitor present Nurse Communication: Mobility status;Need for lift equipment PT Visit Diagnosis: Muscle weakness (generalized) (M62.81);Difficulty in walking, not elsewhere classified (R26.2);Other symptoms and signs involving the nervous system (R29.898);Hemiplegia and hemiparesis Hemiplegia - Right/Left: Right Hemiplegia - dominant/non-dominant: Dominant Hemiplegia - caused by: Nontraumatic  intracerebral hemorrhage     Time: 1127-1218 PT Time Calculation (min) (ACUTE ONLY): 51 min  Charges:    $Therapeutic Exercise: 8-22 mins $Therapeutic Activity: 23-37 mins PT General Charges $$ ACUTE PT VISIT: 1 Visit                     Vernida Goodie, PT, DPT Acute Rehabilitation Services  Office: [phone]    Ellyn Hack 12/13/2023, 1:59 PM

## 2023-12-13 NOTE — Progress Notes (Signed)
 Occupational Therapy Treatment Patient Details Name: Garrett [name] MRN: [id number] DOB: 02/06/1976 Today's Date: 12/13/2023   History of present illness Pt is a 48 yo male who was found down 10/27/23 with agonal breathing. 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 Intubated 3/8, cortrak placed 3/14, trach placed 3/22, PEG placed 3/24. PMH: HTN, smoker   OT comments  Patient with incremental progress toward patient focused goals. Patient tolerated AA/PROM to bilateral upper extremities. Placed bilateral hand splints as SO in room stated they had not been placed for a few days. Max A for rolling side to side, patient lethargic and not responding to cues. Positioned for comfort with neck positioned with pillow for improved neutral positioning. OT to continue efforts in the acute setting to address deficits and Patient will benefit from continued inpatient follow up therapy, <3 hours/day.       If plan is discharge home, recommend the following:      Equipment Recommendations  Wheelchair cushion (measurements OT);Hospital bed;Wheelchair (measurements OT);Hoyer lift    Recommendations for [employer]      Precautions / Restrictions Precautions Precautions: Fall Precaution/Restrictions Comments: trach, PEG, abdominal binder, SBP < 160 Required Braces or Orthoses: Other Brace Other Brace: has bilateral prevalon boots Restrictions Weight Bearing Restrictions Per Provider Order: No       Mobility Bed Mobility Overal bed mobility: Needs Assistance Bed Mobility: Rolling Rolling: Total assist              Transfers                         Balance                                           ADL either performed or assessed with clinical judgement   ADL       Grooming: Total  assistance;Bed level;Wash/dry face                                      Extremity/Trunk Assessment Upper Extremity Assessment Upper Extremity Assessment: RUE deficits/detail;LUE deficits/detail RUE Sensation: decreased light touch;decreased proprioception RUE Coordination: decreased fine motor;decreased gross motor LUE Sensation: decreased light touch;decreased proprioception LUE Coordination: decreased fine motor;decreased gross motor   Lower Extremity Assessment Lower Extremity Assessment: Defer to PT evaluation        Vision   Vision Assessment?: Vision impaired- to be further tested in functional context   Perception Perception Perception: Not tested   Praxis Praxis Praxis: Not tested   Communication Communication Communication: Impaired Factors Affecting Communication: Trach/intubated   Cognition Arousal: Lethargic Behavior During Therapy: Flat affect Cognition: Difficult to assess Difficult to assess due to: Level of arousal, Tracheostomy                             Following commands: Impaired        Cueing   Cueing Techniques: Verbal cues, Tactile cues, Visual cues  Exercises General Exercises - Upper Extremity Shoulder Flexion: PROM, Both, 5 reps, Supine Shoulder Horizontal ABduction: PROM, Both, 5 reps, Supine Shoulder Horizontal ADduction: PROM, Both, 5 reps,  Supine Elbow Flexion: PROM, Both, 5 reps, Supine Elbow Extension: PROM, 5 reps, Both, Supine Wrist Flexion: PROM, Both, 5 reps, Supine Wrist Extension: PROM, Both, 5 reps, Supine Digit Composite Flexion: PROM, Both, 5 reps, Supine Composite Extension: PROM, Both, 5 reps, Supine Other Exercises Other Exercises: cervical rotation and lateral flexion to R, positioned towel roll to keep pt midline end of session    Shoulder Instructions       General Comments      Pertinent Vitals/ Pain       Pain Assessment Pain Assessment: Faces Faces Pain Scale: No hurt Pain  Intervention(s): Monitored during session                                                          Frequency  Min 1X/week        Progress Toward Goals  OT Goals(current goals can now be found in the care plan section)  Progress towards OT goals: Progressing toward goals  Acute Rehab OT Goals OT Goal Formulation: Patient unable to participate in goal setting Time For Goal Achievement: 12/27/23 Potential to Achieve Goals: Fair  Plan      Co-evaluation                 AM-PAC OT "6 Clicks" Daily Activity     Outcome Measure   Help from another person eating meals?: Total Help from another person taking care of personal grooming?: Total Help from another person toileting, which includes using toliet, bedpan, or urinal?: Total Help from another person bathing (including washing, rinsing, drying)?: Total Help from another person to put on and taking off regular upper body clothing?: Total Help from another person to put on and taking off regular lower body clothing?: Total 6 Click Score: 6    End of Session Equipment Utilized During Treatment: Oxygen  OT Visit Diagnosis: Other abnormalities of gait and mobility (R26.89);Muscle weakness (generalized) (M62.81);Other symptoms and signs involving the nervous system (R29.898);Other symptoms and signs involving cognitive function;Hemiplegia and hemiparesis Hemiplegia - caused by: Nontraumatic SAH   Activity Tolerance Patient tolerated treatment well   Patient Left in bed;with call bell/phone within reach   Nurse Communication Other (comment)        Time: 5697-9480 OT Time Calculation (min): 20 min  Charges: OT General Charges $OT Visit: 1 Visit OT Treatments $Therapeutic Activity: 8-22 mins  12/13/2023  RP, OTR/L  Acute Rehabilitation Services  Office:  [phone]   [name] 12/13/2023, 9:27 AM

## 2023-12-14 DIAGNOSIS — I613 Nontraumatic intracerebral hemorrhage in brain stem: Secondary | ICD-10-CM | POA: Diagnosis not present

## 2023-12-14 LAB — GLUCOSE, CAPILLARY
Glucose-Capillary: 135 mg/dL — ABNORMAL HIGH (ref 70–99)
Glucose-Capillary: 139 mg/dL — ABNORMAL HIGH (ref 70–99)
Glucose-Capillary: 143 mg/dL — ABNORMAL HIGH (ref 70–99)
Glucose-Capillary: 132 mg/dL — ABNORMAL HIGH (ref 70–99)
Glucose-Capillary: 106 mg/dL — ABNORMAL HIGH (ref 70–99)
Glucose-Capillary: 95 mg/dL (ref 70–99)

## 2023-12-14 LAB — CBC
HCT: 34.9 % — ABNORMAL LOW (ref 39.0–52.0)
Hemoglobin: 11.1 g/dL — ABNORMAL LOW (ref 13.0–17.0)
MCH: 30.9 pg (ref 26.0–34.0)
MCHC: 31.8 g/dL (ref 30.0–36.0)
MCV: 97.2 fL (ref 80.0–100.0)
Platelets: 444 10*3/uL — ABNORMAL HIGH (ref 150–400)
RBC: 3.59 MIL/uL — ABNORMAL LOW (ref 4.22–5.81)
RDW: 13.5 % (ref 11.5–15.5)
WBC: 9.4 10*3/uL (ref 4.0–10.5)
nRBC: 0 % (ref 0.0–0.2)

## 2023-12-14 LAB — BASIC METABOLIC PANEL WITH GFR
Anion gap: 10 (ref 5–15)
BUN: 27 mg/dL — ABNORMAL HIGH (ref 6–20)
CO2: 25 mmol/L (ref 22–32)
Calcium: 9.1 mg/dL (ref 8.9–10.3)
Chloride: 101 mmol/L (ref 98–111)
Creatinine, Ser: 0.8 mg/dL (ref 0.61–1.24)
GFR, Estimated: >60 mL/min (ref >60)
Glucose, Bld: 134 mg/dL — ABNORMAL HIGH (ref 70–99)
Potassium: 3.6 mmol/L (ref 3.5–5.1)
Sodium: 136 mmol/L (ref 135–145)

## 2023-12-14 NOTE — Progress Notes (Signed)
 Seen today no over changes.  Not responding to me verbally regarding "blink x 2 for yes, x 1 for now" Slight WOB, but doesn't appear to be in distress  BP (!) 135/97 (BP Location: Right Arm)   Pulse 85   Temp 98.6 F (37 C) (Oral)   Resp 18   Ht 5\' 8"  (1.727 m)   Wt 99.8 kg   SpO2 97%   BMI 33.45 kg/m  R eye open, cta b anteriroly Peg in place No vol movement upper LE Reflexes 2/3  Will reassess again in am   Jai Maurico Perrell, MD [facility] 4:07 PM

## 2023-12-15 DIAGNOSIS — I613 Nontraumatic intracerebral hemorrhage in brain stem: Secondary | ICD-10-CM | POA: Diagnosis not present

## 2023-12-15 LAB — GLUCOSE, CAPILLARY
Glucose-Capillary: 145 mg/dL — ABNORMAL HIGH (ref 70–99)
Glucose-Capillary: 117 mg/dL — ABNORMAL HIGH (ref 70–99)
Glucose-Capillary: 122 mg/dL — ABNORMAL HIGH (ref 70–99)
Glucose-Capillary: 129 mg/dL — ABNORMAL HIGH (ref 70–99)
Glucose-Capillary: 124 mg/dL — ABNORMAL HIGH (ref 70–99)
Glucose-Capillary: 107 mg/dL — ABNORMAL HIGH (ref 70–99)

## 2023-12-15 MED ORDER — FREE WATER
200.0000 mL | Freq: Four times a day (QID) | Status: DC
  Administered 2023-12-15 – 2023-12-18 (×12): 200 mL

## 2023-12-15 NOTE — Plan of Care (Signed)
  Problem: Intracerebral Hemorrhage Tissue Perfusion: Goal: Complications of Intracerebral Hemorrhage will be minimized Outcome: Progressing   Problem: Nutrition: Goal: Risk of aspiration will decrease Outcome: Progressing Goal: Dietary intake will improve Outcome: Progressing   Problem: Coping: Goal: Ability to adjust to condition or change in health will improve Outcome: Progressing   Problem: Fluid Volume: Goal: Ability to maintain a balanced intake and output will improve Outcome: Progressing   Problem: Metabolic: Goal: Ability to maintain appropriate glucose levels will improve Outcome: Progressing   Problem: Nutritional: Goal: Maintenance of adequate nutrition will improve Outcome: Progressing Goal: Progress toward achieving an optimal weight will improve Outcome: Progressing   Problem: Clinical Measurements: Goal: Ability to maintain clinical measurements within normal limits will improve Outcome: Progressing Goal: Will remain free from infection Outcome: Progressing Goal: Diagnostic test results will improve Outcome: Progressing Goal: Respiratory complications will improve Outcome: Progressing Goal: Cardiovascular complication will be avoided Outcome: Progressing   Problem: Coping: Goal: Level of anxiety will decrease Outcome: Progressing   Problem: Elimination: Goal: Will not experience complications related to bowel motility Outcome: Progressing Goal: Will not experience complications related to urinary retention Outcome: Progressing   Problem: Pain Managment: Goal: General experience of comfort will improve and/or be controlled Outcome: Progressing   Problem: Safety: Goal: Ability to remain free from injury will improve Outcome: Progressing   Problem: Health Behavior/Discharge Planning: Goal: Ability to manage tracheostomy will improve Outcome: Progressing   Problem: Respiratory: Goal: Patent airway maintenance will improve Outcome:  Progressing

## 2023-12-15 NOTE — Progress Notes (Signed)
 [facility] ROUNDING NOTE Garrett [name] WJX:[id number]  DOB: 1976/01/12  DOA: 10/27/2023  PCP: Pcp, No  12/15/2023,3:31 PM  LOS: 49 days    Code Status: DNR   from: Home current Dispo: Unclear   48 year old male No prior medical illnesses 3/8 admitted found down unresponsive drug screen positive for opioids given Narcan -BP in ED 260/156 CT head 4.1 cm ICH in pons with intraventricular extension to fourth ventricle basal cisterns CXR RUL perihilar infiltrates?  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 Recommendations from ophthalmology 4/7-(4/7): maxitrol ointment TID x1 week followed by Refresh PM TID-QID for chronic lubrication; can tape eyelid closed if exposure is still an issue (plastic tape rather than paper tape and tape eye directly rather than  with gauze)  This is improved  AKI earlier in hospital stay with acute urinary retention-has slight volume depletion again Foley placed 4/13 will need to continue-Free water  as above  Macrocytic anemia thrombocytosis Overall stable/resolved  Impaired glucose tolerance Sugars below 180 would hold any insulins at this time  Perineal skin injury Continue Gerhard's Butt cream  Prior ileus  Option spoke with his mother Aden Agreste [phone] at length on 4/26-explained that usually recovery occurs  within the first several days post event and better likelihood is early-still awaiting recovery He is DNR and would not be aggressively resuscitated Expect he will be hospitalized until we can find clear placement--long-term plan is getting Medicaid to bed in June or July  DVT prophylaxis: SCD  Status is: Inpatient Remains inpatient appropriate because:   Requires further disposition planning      Subjective:  Sleepy today no distress seems comfortable no real changes to overall condition  Objective + exam Vitals:   12/15/23 0350 12/15/23 0756 12/15/23 0822 12/15/23 1117  BP: 125/82 (!) 144/95  (!) 148/96  Pulse: 82   90  Resp: 17  19 14   Temp: 98.9 F (37.2 C) 98.7 F (37.1 C)  99 F (37.2 C)  TempSrc: Axillary Axillary  Axillary  SpO2: 100%  99% 100%  Weight:      Height:       Filed Weights   12/12/23 0500 12/13/23 0500 12/14/23 0500  Weight: 99.6 kg 99.6 kg 99.8 kg    Examination: External ocular movements intact to right eye--unclear if he understands me S1-S2 no murmur Abdomen soft no rebound PEG in place Cannot obtain ROS difficult exam neurologically Prevalon boots remain in place   Data Reviewed:  CBC    Component Value Date/Time   WBC 9.4 12/14/2023 0909   RBC 3.59 (L) 12/14/2023 0909   HGB 11.1 (L) 12/14/2023 0909   HCT 34.9 (L) 12/14/2023 0909   PLT 444 (H) 12/14/2023 0909   MCV 97.2 12/14/2023 0909   MCH 30.9 12/14/2023 0909   MCHC 31.8  12/14/2023 0909   RDW 13.5 12/14/2023 0909   LYMPHSABS 2.6 11/20/2023 0644   MONOABS 1.4 (H) 11/20/2023 0644   EOSABS 0.1 11/20/2023 0644   BASOSABS 0.0 11/20/2023 0644      Latest Ref Rng & Units 12/14/2023    9:09 AM 12/11/2023    8:43 AM 12/07/2023    8:57 AM  CMP  Glucose 70 - 99 mg/dL 147  829  562   BUN 6 - 20 mg/dL 27  22  21    Creatinine 0.61 - 1.24 mg/dL 1.30  8.65  7.84   Sodium 135 - 145 mmol/L 136  137  137   Potassium 3.5 - 5.1 mmol/L 3.6  3.9  4.0   Chloride 98 - 111 mmol/L 101  101  101   CO2 22 - 32 mmol/L 25  25  26    Calcium 8.9 - 10.3 mg/dL 9.1  9.2  9.1     Scheduled Meds:  amLODipine   10 mg Per Tube Daily   artificial tears   Left Eye Q8H   Chlorhexidine  Gluconate Cloth  6 each Topical Daily   enoxaparin  (LOVENOX ) injection  50 mg Subcutaneous Daily   famotidine   20 mg Per Tube BID   feeding supplement (PROSource TF20)  60 mL Per Tube TID   free water   100 mL Per Tube Q6H   insulin  aspart  0-15 Units Subcutaneous Q4H   metoprolol  tartrate  100 mg Per Tube BID   modafinil   100 mg Per Tube Daily   multivitamin with minerals  1 tablet Per Tube Daily   nutrition supplement (JUVEN)  1 packet Per Tube BID BM   mouth rinse  15 mL Mouth Rinse 4 times per day   Continuous Infusions:  feeding supplement (OSMOLITE 1.5 CAL) 1,000 mL (12/15/23 0940)    Time  24  [name], MD  [facility]

## 2023-12-16 DIAGNOSIS — I613 Nontraumatic intracerebral hemorrhage in brain stem: Secondary | ICD-10-CM | POA: Diagnosis not present

## 2023-12-16 LAB — GLUCOSE, CAPILLARY
Glucose-Capillary: 115 mg/dL — ABNORMAL HIGH (ref 70–99)
Glucose-Capillary: 122 mg/dL — ABNORMAL HIGH (ref 70–99)
Glucose-Capillary: 128 mg/dL — ABNORMAL HIGH (ref 70–99)
Glucose-Capillary: 133 mg/dL — ABNORMAL HIGH (ref 70–99)
Glucose-Capillary: 135 mg/dL — ABNORMAL HIGH (ref 70–99)
Glucose-Capillary: 143 mg/dL — ABNORMAL HIGH (ref 70–99)

## 2023-12-16 NOTE — Progress Notes (Signed)
 No changes--doesn't respond really to me or my questions.   BP (!) 136/90 (BP Location: Right Arm)   Pulse 82   Temp 99.3 F (37.4 C) (Axillary)   Resp (!) 21   Ht 5\' 8"  (1.727 m)   Wt 99.8 kg   SpO2 97%   BMI 33.45 kg/m  R eye open, cta b anteriorly---desont blink to "yes" --"no" prompts Peg in place No vol movement upper or  LE's Reflexes deferred.     Will ask therapy to re-eval in am--had spoken with family several days ago--wish to see if any discernible improvements from their perspective   Abbie Hock, MD [facility] 3:15 PM

## 2023-12-17 DIAGNOSIS — I613 Nontraumatic intracerebral hemorrhage in brain stem: Secondary | ICD-10-CM | POA: Diagnosis not present

## 2023-12-17 DIAGNOSIS — Z930 Tracheostomy status: Secondary | ICD-10-CM | POA: Diagnosis not present

## 2023-12-17 LAB — GLUCOSE, CAPILLARY
Glucose-Capillary: 137 mg/dL — ABNORMAL HIGH (ref 70–99)
Glucose-Capillary: 151 mg/dL — ABNORMAL HIGH (ref 70–99)
Glucose-Capillary: 122 mg/dL — ABNORMAL HIGH (ref 70–99)
Glucose-Capillary: 148 mg/dL — ABNORMAL HIGH (ref 70–99)
Glucose-Capillary: 116 mg/dL — ABNORMAL HIGH (ref 70–99)
Glucose-Capillary: 118 mg/dL — ABNORMAL HIGH (ref 70–99)

## 2023-12-17 LAB — BASIC METABOLIC PANEL WITH GFR
Anion gap: 16 — ABNORMAL HIGH (ref 5–15)
BUN: 26 mg/dL — ABNORMAL HIGH (ref 6–20)
CO2: 24 mmol/L (ref 22–32)
Calcium: 9.4 mg/dL (ref 8.9–10.3)
Chloride: 98 mmol/L (ref 98–111)
Creatinine, Ser: 0.79 mg/dL (ref 0.61–1.24)
GFR, Estimated: >60 mL/min (ref >60)
Glucose, Bld: 165 mg/dL — ABNORMAL HIGH (ref 70–99)
Potassium: 3.8 mmol/L (ref 3.5–5.1)
Sodium: 138 mmol/L (ref 135–145)

## 2023-12-17 NOTE — Progress Notes (Signed)
 [facility] ROUNDING NOTE Djay Macneal EXB:[id number]  DOB: [birth year]  DOA: 10/27/2023  PCP: Pcp, No  12/17/2023,1:05 PM  LOS: 51 days    Code Status: DNR   from: Home current Dispo: Unclear   48 year old male No prior medical illnesses 3/8 admitted found down unresponsive drug screen positive for opioids given Narcan -BP in ED 260/156 CT head 4.1 cm ICH in pons with intraventricular extension to fourth ventricle basal cisterns CXR RUL perihilar infiltrates?  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--Catastrophic injury repeat scan 3/9,3/18 and 3/29 with decreased size-etiology likely secondary to uncontrolled blood pressure Echo EF 60-65% A1c 4.7 LDL 96 PEG tube placed unable to graduate feeds orally will need PEG tube for long-term On modafinil  100 IV fentanyl  stopped to decrease lethargy can use Tylenol  for pain Stress prophylaxis with Pepcid  20 per tube--splint care per OT to prevent contractures as per them Awaiting long term plan for placement--which might be June/July when medicaid comes through  Acute respiratory failure secondary to inability to protect airway Trach Place 3/22changed to cuffless 4/21- tracheotomy care per pulmonary-asked them to re-visit  Aspiration pneumonia in the setting of trach which is now chronic and dysphagia Treated for MRSA pneumonia with vancomycin  from 3/24 through 4/15 Remains high risk for developing pneumonia--- continue Osmolite 50 cc/H, Prosource 60 3 times daily 4/26 increased free water  to 200  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 Recommendations from ophthalmology 4/7-(4/7): maxitrol ointment TID x1 week followed by Refresh PM TID-QID for chronic lubrication; can tape eyelid closed if exposure is still an  issue (plastic tape rather than paper tape and tape eye directly rather than with gauze)  This is improved  AKI earlier in hospital stay with acute urinary retention-has slight volume depletion again Foley placed 4/13 will need to continue-Free water  increased to 200 q 4  Macrocytic anemia thrombocytosis Overall stable/resolved  Impaired glucose tolerance Sugars below 180 would hold any insulins at this time  Perineal skin injury Continue Gerhard's Butt cream  Prior ileus  spoke with his mother Aden Agreste [phone] at length on 4/26-explained that usually recovery occurs  within the first several days post event and better likelihood is early-still awaiting recovery He is DNR and would not be aggressively resuscitated Called but left VM for mother 4./28 again--no signif changes--awaits medicaid to be able to place at skilled   DVT prophylaxis: SCD  Status is: Inpatient Remains inpatient appropriate because:   Requires further disposition planning   Subjective:  More awake coherent in nad cannot obtain ROS  Objective + exam Vitals:   12/17/23 0343 12/17/23 0742 12/17/23 0818 12/17/23 1140  BP:  (!) 138/107 (!) 140/95 (!) 123/93  Pulse: (!) 102 96 99 88  Resp: 18 (!) 21  20  Temp:  97.8 F (36.6 C)  99.5 F (37.5 C)  TempSrc:  Axillary  Oral  SpO2: 97% 97% 97% 96%  Weight:      Height:       Filed Weights   12/12/23 0500 12/13/23 0500 12/14/23 0500  Weight: 99.6 kg 99.6 kg 99.8 kg    Examination:  External ocular movements intact to right eye--unclear if he understands me-L eye taped S1-S2 no murmur Abdomen soft no rebound PEG in place Cannot obtain ROS difficult exam neurologically Prevalon boots removed--no LE edema ulcer  rash Densely plegic overall  Data Reviewed:   CBC    Component Value Date/Time   WBC 9.4 12/14/2023 0909   RBC 3.59 (L) 12/14/2023 0909   HGB 11.1 (L) 12/14/2023 0909   HCT 34.9 (L) 12/14/2023 0909   PLT 444 (H) 12/14/2023 0909    MCV 97.2 12/14/2023 0909   MCH 30.9 12/14/2023 0909   MCHC 31.8 12/14/2023 0909   RDW 13.5 12/14/2023 0909   LYMPHSABS 2.6 11/20/2023 0644   MONOABS 1.4 (H) 11/20/2023 0644   EOSABS 0.1 11/20/2023 0644   BASOSABS 0.0 11/20/2023 0644      Latest Ref Rng & Units 12/17/2023    9:20 AM 12/14/2023    9:09 AM 12/11/2023    8:43 AM  CMP  Glucose 70 - 99 mg/dL 102  725  366   BUN 6 - 20 mg/dL 26  27  22    Creatinine 0.61 - 1.24 mg/dL 4.40  3.47  4.25   Sodium 135 - 145 mmol/L 138  136  137   Potassium 3.5 - 5.1 mmol/L 3.8  3.6  3.9   Chloride 98 - 111 mmol/L 98  101  101   CO2 22 - 32 mmol/L 24  25  25    Calcium 8.9 - 10.3 mg/dL 9.4  9.1  9.2     Scheduled Meds:  amLODipine   10 mg Per Tube Daily   artificial tears   Left Eye Q8H   Chlorhexidine  Gluconate Cloth  6 each Topical Daily   enoxaparin  (LOVENOX ) injection  50 mg Subcutaneous Daily   famotidine   20 mg Per Tube BID   feeding supplement (PROSource TF20)  60 mL Per Tube TID   free water   200 mL Per Tube Q6H   insulin  aspart  0-15 Units Subcutaneous Q4H   metoprolol  tartrate  100 mg Per Tube BID   modafinil   100 mg Per Tube Daily   multivitamin with minerals  1 tablet Per Tube Daily   nutrition supplement (JUVEN)  1 packet Per Tube BID BM   mouth rinse  15 mL Mouth Rinse 4 times per day   Continuous Infusions:  feeding supplement (OSMOLITE 1.5 CAL) 1,000 mL (12/17/23 0825)    Time  24  [name], MD  [facility]

## 2023-12-17 NOTE — Progress Notes (Signed)
 Physical Therapy Treatment Patient Details Name: Garrett [name] MRN: [id number] DOB: 09/08/1975 Today's Date: 12/17/2023   History of Present Illness Pt is a 48 yo male who was found down 10/27/23 with agonal breathing. 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 Intubated 3/8, cortrak placed 3/14, trach placed 3/22, PEG placed 3/24. PMH: HTN, smoker    PT Comments  Pt opens eyes to name call, but does not follow commands this date. No active movement noted in any extremity. Performed PROM and D1 PNF pattern to BLE's. Utilized maximove to lift out of bed up to chair to promote upright tolerance and respiratory function. Will continue to follow acutely.     If plan is discharge home, recommend the following: Two people to help with walking and/or transfers;Two people to help with bathing/dressing/bathroom;Assistance with cooking/housework;Assistance with feeding;Direct supervision/assist for medications management;Direct supervision/assist for financial management;Assist for transportation;Help with stairs or ramp for entrance;Supervision due to cognitive status   Can travel by private vehicle        Equipment Recommendations  [place] lift;Hospital bed;Wheelchair (measurements PT);Wheelchair cushion (measurements PT);BSC/3in1;Other (comment) (air mattress)    Recommendations for Other Services       Precautions / Restrictions Precautions Precautions: Fall;Other (comment) Recall of Precautions/Restrictions: Impaired Precaution/Restrictions Comments: trach, PEG, abdominal binder, SBP < 160 Restrictions Weight Bearing Restrictions Per Provider Order: No     Mobility  Bed Mobility Overal bed mobility: Needs Assistance Bed Mobility: Rolling Rolling: Total assist         General bed mobility comments: TotalA for rolling  to R/L for placement of maxi sky lift pad    Transfers Overall transfer level: Needs assistance Equipment used: Ambulation equipment used Transfers: Bed to chair/wheelchair/BSC             General transfer comment: Maximove bed to chair Transfer via Lift Equipment: Maximove  Ambulation/Gait                   Stairs             Wheelchair Mobility     Tilt Bed    Modified Rankin (Stroke Patients Only)       Balance                                            Communication Communication Communication: Impaired Factors Affecting Communication: Trach/intubated  Cognition Arousal: Alert Behavior During Therapy: Flat affect   PT - Cognitive impairments: Difficult to assess Difficult to assess due to: Tracheostomy, Impaired communication                     PT - Cognition Comments: Opens eye to name call, but no visual tracking or following commands Following commands: Impaired (no command following)      Cueing    Exercises General Exercises - Lower Extremity Ankle Circles/Pumps: PROM, Both, 10 reps, Supine Hip ABduction/ADduction: PROM, Both, 10 reps, Supine Other Exercises Other Exercises: BLE: D1 PROM x 10    General Comments        Pertinent Vitals/Pain Pain Assessment Pain Assessment: Faces Faces Pain Scale: No hurt    Home Living  Prior Function            PT Goals (current goals can now be found in the care plan section) Acute Rehab PT Goals Patient Stated Goal: unable to state Potential to Achieve Goals: Fair Progress towards PT goals: Not progressing toward goals - comment    Frequency    Min 2X/week      PT Plan      Co-evaluation              AM-PAC PT "6 Clicks" Mobility   Outcome Measure  Help needed turning from your back to your side while in a flat bed without using bedrails?: Total Help needed moving from lying on your back to  sitting on the side of a flat bed without using bedrails?: Total Help needed moving to and from a bed to a chair (including a wheelchair)?: Total Help needed standing up from a chair using your arms (e.g., wheelchair or bedside chair)?: Total Help needed to walk in hospital room?: Total Help needed climbing 3-5 steps with a railing? : Total 6 Click Score: 6    End of Session Equipment Utilized During Treatment: Oxygen Activity Tolerance: Patient tolerated treatment well Patient left: in chair;with call bell/phone within reach;with chair alarm set Nurse Communication: Mobility status;Need for lift equipment PT Visit Diagnosis: Muscle weakness (generalized) (M62.81);Difficulty in walking, not elsewhere classified (R26.2);Other symptoms and signs involving the nervous system (R29.898);Hemiplegia and hemiparesis Hemiplegia - Right/Left: Right Hemiplegia - dominant/non-dominant: Dominant Hemiplegia - caused by: Nontraumatic intracerebral hemorrhage     Time: 1610-9604 PT Time Calculation (min) (ACUTE ONLY): 31 min  Charges:    $Therapeutic Activity: 23-37 mins PT General Charges $$ ACUTE PT VISIT: 1 Visit                     Verdia Glad, PT, DPT Acute Rehabilitation Services Office [phone]    Claria Crofts 12/17/2023, 3:14 PM

## 2023-12-17 NOTE — Progress Notes (Signed)
 NAME:  Garrett [name], MRN:  [id number], DOB:  Aug 04, 1976, LOS: 51 ADMISSION DATE:  10/27/2023, CONSULTATION DATE:  10/27/2023 REFERRING MD:  Florentino Hurdle, DO CHIEF COMPLAINT:  Found Down  History of Present Illness:  48 y/o male with past medical history of hypertension who was found down for unknown downtime by his fiance when she came home from work. Reportedly, having agonal breathing.  He apparently had driven her to work this morning.  He has HTN but never checks his BP and does not follow with MD.  She says you can tell when his BP is really high; his eyes get blood shot and he is sweating.  No h/o seizures.  Besides almost daily [name] and cigarette smoking, she is not aware of any other drugs. Drug screen positive for opioids and he was given several doses of Narcan . Patient found to have:  1. 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 Basal cisterns and fourth ventricle are effaced without hydrocephalus at this time. 2. Trace additional subarachnoid hemorrhage along the left parietal convexity. BP in ED 262/156. His CXR revealed a RUL and perihilar infiltrates suggesting aspiration pneumonia. ED labs also revealed PH 7.169, LA 4.4, CPK 985. Patient was intubated in ED.  Pertinent  Medical History  hypertension  Significant Hospital Events: Including procedures, antibiotic start and stop dates in addition to other pertinent events   3/8: Intubated in ED, pontine bleed/SAH. CT head 17:09 Acute large hemorrhage 4.1 cm in pons with intraventricular extension without hydrocephalus 3/9: CTA 03:14 AM No change in IPH in pons, similar biparietal [facility] 3/14: Now awake, appears locked in can follow commands with eyes. 3/16 Purposeful with left upper extremity  3/22 tracheostomy placed at bedside, bleeding issues overnight from trach site 3/24 PEG (Dr. Hildy Lowers) 3/24  sputum culture with MRSA 3/27 vomited. TF on hold. Abd film c/w ileus. Added reglan , got SSE. Did tolerate PSV all day  3/28 BMs x2 after SSE day prior. Added back TFs at 1/2 rated. Tolerated PS 3/29 Tolerated PS  3/31 Tolerating CPAP PS 15/5, TF on hold due to ileus. 4/1: con't to hold tube feeds today, restarting vancomycin  and sending tracheal aspirate as peaks are 37, plat 24, driving 19. Thick secretions. Fever overnight.  4/2: peaks improved. Ongoing hiccups. Trickle feeding. Neuro exam unchanged.  4/20: trach was changed to cuffless #8 4/28: not safe to swallow. Limited communication and he follows simple commands. On TF  Interim History / Subjective:   Has been doing well on trach collar No issues with secretions at this time  Objective   Blood pressure (!) 123/93, pulse 88, temperature 99.5 F (37.5 C), temperature source Oral, resp. rate 20, height 5\' 8"  (1.727 m), weight 99.8 kg, SpO2 96%.    FiO2 (%):  [21 %] 21 %   Intake/Output Summary (Last 24 hours) at 12/17/2023 1501 Last data filed at 12/16/2023 2000 Gross per 24 hour  Intake --  Output 1275 ml  Net -1275 ml   Filed Weights   12/12/23 0500 12/13/23 0500 12/14/23 0500  Weight: 99.6 kg 99.6 kg 99.8 kg   Physical Exam: General: middle aged male, lying in bed, no distress minimally responsive. On TC 28% HEENT: Tracheostomy in place. Minimal secretions. Chest: Clear bilaterally CV: HS normal  Abdomen: PEG in place.  Extremities: No edema. Neuro: eyes open, Not following commands.   Assessment & Plan:  Large pontine hemorrhage  with IVH and brainstem compression  Acute hypoxic respiratory failure s/p tracheostomy MRSA bronchitis- treated Dysphagia - Status post PEG placement.   - PMV and swallow eval when appropriate. Due to concern about managing secretions and his mental status, will not downsize for now - Continue trach collar - PRN duonebs and suctionioning   Madelynn Schilder, M.D. [facility]  Pulmonary/Critical Care Medicine  See Amion for personal pager PCCM on call pager [phone] until 7pm. Please call Elink 7p-7a. [phone]

## 2023-12-18 DIAGNOSIS — I613 Nontraumatic intracerebral hemorrhage in brain stem: Secondary | ICD-10-CM | POA: Diagnosis not present

## 2023-12-18 LAB — CBC
HCT: 35.9 % — ABNORMAL LOW (ref 39.0–52.0)
Hemoglobin: 11.3 g/dL — ABNORMAL LOW (ref 13.0–17.0)
MCH: 30.5 pg (ref 26.0–34.0)
MCHC: 31.5 g/dL (ref 30.0–36.0)
MCV: 97 fL (ref 80.0–100.0)
Platelets: 561 10*3/uL — ABNORMAL HIGH (ref 150–400)
RBC: 3.7 MIL/uL — ABNORMAL LOW (ref 4.22–5.81)
RDW: 13.4 % (ref 11.5–15.5)
WBC: 10.2 10*3/uL (ref 4.0–10.5)
nRBC: 0 % (ref 0.0–0.2)

## 2023-12-18 LAB — BASIC METABOLIC PANEL WITH GFR
Anion gap: 11 (ref 5–15)
BUN: 25 mg/dL — ABNORMAL HIGH (ref 6–20)
CO2: 25 mmol/L (ref 22–32)
Calcium: 9.2 mg/dL (ref 8.9–10.3)
Chloride: 101 mmol/L (ref 98–111)
Creatinine, Ser: 0.69 mg/dL (ref 0.61–1.24)
GFR, Estimated: >60 mL/min (ref >60)
Glucose, Bld: 128 mg/dL — ABNORMAL HIGH (ref 70–99)
Potassium: 3.8 mmol/L (ref 3.5–5.1)
Sodium: 137 mmol/L (ref 135–145)

## 2023-12-18 LAB — GLUCOSE, CAPILLARY
Glucose-Capillary: 111 mg/dL — ABNORMAL HIGH (ref 70–99)
Glucose-Capillary: 146 mg/dL — ABNORMAL HIGH (ref 70–99)
Glucose-Capillary: 149 mg/dL — ABNORMAL HIGH (ref 70–99)
Glucose-Capillary: 156 mg/dL — ABNORMAL HIGH (ref 70–99)
Glucose-Capillary: 110 mg/dL — ABNORMAL HIGH (ref 70–99)
Glucose-Capillary: 109 mg/dL — ABNORMAL HIGH (ref 70–99)

## 2023-12-18 MED ORDER — FREE WATER
200.0000 mL | Status: DC
  Administered 2023-12-18 – 2023-12-20 (×23): 200 mL

## 2023-12-18 MED ORDER — FREE WATER: 200.0000 mL | Status: DC

## 2023-12-18 NOTE — Plan of Care (Signed)
  Problem: Pain Managment: Goal: General experience of comfort will improve and/or be controlled Outcome: Progressing   Problem: Safety: Goal: Ability to remain free from injury will improve Outcome: Progressing   Problem: Skin Integrity: Goal: Risk for impaired skin integrity will decrease Outcome: Progressing   Problem: Coping: Goal: Will verbalize positive feelings about self Outcome: Not Progressing Goal: Will identify appropriate support needs Outcome: Not Progressing   Problem: Health Behavior/Discharge Planning: Goal: Ability to manage health-related needs will improve Outcome: Not Progressing Goal: Goals will be collaboratively established with patient/family Outcome: Not Progressing   Problem: Self-Care: Goal: Ability to participate in self-care as condition permits will improve Outcome: Not Progressing Goal: Verbalization of feelings and concerns over difficulty with self-care will improve Outcome: Not Progressing Goal: Ability to communicate needs accurately will improve Outcome: Not Progressing

## 2023-12-19 DIAGNOSIS — L8990 Pressure ulcer of unspecified site, unspecified stage: Secondary | ICD-10-CM | POA: Diagnosis not present

## 2023-12-19 DIAGNOSIS — I629 Nontraumatic intracranial hemorrhage, unspecified: Secondary | ICD-10-CM

## 2023-12-19 DIAGNOSIS — I613 Nontraumatic intracerebral hemorrhage in brain stem: Secondary | ICD-10-CM | POA: Diagnosis not present

## 2023-12-19 DIAGNOSIS — Z930 Tracheostomy status: Secondary | ICD-10-CM | POA: Diagnosis not present

## 2023-12-19 DIAGNOSIS — Z7189 Other specified counseling: Secondary | ICD-10-CM | POA: Diagnosis not present

## 2023-12-19 LAB — GLUCOSE, CAPILLARY
Glucose-Capillary: 111 mg/dL — ABNORMAL HIGH (ref 70–99)
Glucose-Capillary: 133 mg/dL — ABNORMAL HIGH (ref 70–99)
Glucose-Capillary: 125 mg/dL — ABNORMAL HIGH (ref 70–99)
Glucose-Capillary: 152 mg/dL — ABNORMAL HIGH (ref 70–99)
Glucose-Capillary: 136 mg/dL — ABNORMAL HIGH (ref 70–99)
Glucose-Capillary: 121 mg/dL — ABNORMAL HIGH (ref 70–99)

## 2023-12-19 NOTE — Progress Notes (Signed)
 PROGRESS NOTE    Garrett [name]  VWU:[id number] DOB: 1976/02/08 DOA: 10/27/2023 PCP: Pcp, No   Brief Narrative:  Patient is a 48 year old male with no known medical history.  Patient found down on 10/27/2023 unresponsive with UDS positive for opiates, minimal responsiveness to Narcan .  Found to be markedly hypertensive at 260/156.  CT head at intake shows 4.1 cm intracranial hemorrhage at the pons with intraventricular extension into the fourth ventricle and basal cisterns.  Initially admitted by critical care, now extubated to tracheostomy which was placed 3/22.  PEG placed 3/24.  Patient continues to be medically stable for discharge at this time, he does carry high risk for complications in the setting of his need for total care.  Questionable aspiration pneumonia noted during hospitalization now status posttreatment.  Disposition remains difficult given patient's needs and family understands his poor prognosis, as such patient was made DNR per discussion with family as they would not want any further aggressive or heroic measures to be taken.  Patient remains medically stable for discharge patient's Medicaid should be updated in June/July which should assist with placement.    Major events: 3/8: Intubated in ED, pontine bleed/SAH. CT head 17:09 Acute large hemorrhage 4.1 cm in pons with intraventricular extension without hydrocephalus 3/9: CTA 03:14 AM No change in IPH in pons, similar biparietal [facility] 3/14: Now awake, appears locked in can follow commands with eyes. 3/16 Purposeful with left upper extremity  3/22 tracheostomy placed at bedside, bleeding issues overnight from trach site 3/24 PEG (Dr. Hildy Lowers) 3/24 sputum culture with MRSA 3/27 vomited. TF on hold. Abd film c/w ileus. Added reglan , got SSE. Did tolerate PSV all day  3/28 BMs x2 after SSE day prior. Added back TFs at 1/2 rated. Tolerated PS 3/29 Tolerated PS  3/31 Tolerating CPAP PS 15/5, TF on hold due to ileus. 4/1:  con't to hold tube feeds today, restarting vancomycin  and sending tracheal aspirate as peaks are 37, plat 24, driving 19. Thick secretions. Fever overnight.  4/2: peaks improved. Ongoing hiccups. Trickle feeding. Neuro exam unchanged.  4/20: trach was changed to cuffless #8 4/28: not safe to swallow. Limited communication and he follows simple commands.  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-secondary to hypertensive emergency Catastrophic injury repeat scan 3/9,3/18 and 3/29 with decreased size Echo EF 60-65% A1c 4.7 LDL 96 On modafinil  100; discontinue IV narcotics   Acute respiratory failure secondary to inability to protect airway Trach Place 3/22 changed to cuffless 4/21 Pulmonology following -no indication to downsize at this time  Aspiration pneumonia, multifactorial Complicated by chronic trach, dysphagia in the setting of CVA and ongoing dysphagia  Completed vancomycin  12/04/2023   Moderate to severe protein caloric malnutrition, POA Continue Osmolite, Prosource, free water , defer to dietary with witness Osmolite 50 cc/H,  Prosource 60 3 times daily Free water  as below   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 Recommendations from ophthalmology 4/7-(4/7): maxitrol ointment TID x1 week followed by Refresh PM TID-QID for chronic lubrication; can tape eyelid closed if exposure is still an issue   AKI earlier in hospital stay with acute urinary retention Foley replaced 4/13 will need to continue; prior removals unsuccessful, may transition to In-N-Out cath every 6-8  hours in the interim Free water  increased to 200 q 4--increasing to q 2 to see if improves BUN    Macrocytic anemia thrombocytosis Overall stable/resolved Perineal skin injury, POA - Continue Gerhard's Butt cream   DVT prophylaxis: SCDs Start: 10/27/23 2226 Code Status:   Code Status: Do not attempt resuscitation (DNR) PRE-ARREST INTERVENTIONS DESIRED Family Communication: None present  Status is: Inpatient  Dispo: The patient is from: Home              Anticipated d/c is to: Facility              Anticipated d/c date is: Imminent              Patient currently is medically stable for discharge  Consultants:  PCCM, palliative care, general surgery, ophthalmology  Antimicrobials:  None currently  Subjective: No acute issues or events reported overnight, review of systems otherwise quite limited by patient's mental status.  Objective: Vitals:   12/18/23 2000 12/18/23 2306 12/19/23 0300 12/19/23 0347  BP: (!) 131/95 115/84  108/85  Pulse: (!) 109 85  76  Resp: 18 14  13   Temp:  98.5 F (36.9 C)  98.5 F (36.9 C)  TempSrc:  Axillary  Axillary  SpO2: 96% 97% 98% 98%  Weight:      Height:        Intake/Output Summary (Last 24 hours) at 12/19/2023 0716 Last data filed at 12/19/2023 0630 Gross per 24 hour  Intake 8525 ml  Output 1700 ml  Net 6825 ml   Filed Weights   12/13/23 0500 12/14/23 0500  Weight: 99.6 kg 99.8 kg    Examination:  General: Resting comfortably in bed, no acute distress, poorly interactive  Neuro: Flaccid paralysis diffusely without reaction to verbal tactile stimuli other than spontaneous right eye opening HEENT: Left eye closed, right eye able to track motion vertically but not horizontally Neck: Tracheostomy noted. Lungs: Diminished bilaterally without overt wheezes rales or rhonchi. Heart:  Regular rate and rhythm.  Abdomen:  Soft, nondistended.  PEG noted clean dry intact  Data Reviewed: I have personally reviewed following labs and imaging studies  CBC: Recent Labs  Lab 12/14/23 0909 12/18/23 0921  WBC 9.4 10.2  HGB 11.1* 11.3*   HCT 34.9* 35.9*  MCV 97.2 97.0  PLT 444* 561*   Basic Metabolic Panel: Recent Labs  Lab 12/14/23 0909 12/17/23 0920 12/18/23 0921  NA 136 138 137  K 3.6 3.8 3.8  CL 101 98 101  CO2 25 24 25   GLUCOSE 134* 165* 128*  BUN 27* 26* 25*  CREATININE 0.80 0.79 0.69  CALCIUM 9.1 9.4 9.2   GFR: Estimated Creatinine Clearance: 130.8 mL/min (by C-G formula based on SCr of 0.69 mg/dL).  CBG: Recent Labs  Lab 12/18/23 1212 12/18/23 1532 12/18/23 1942 12/18/23 2305 12/19/23 0347  GLUCAP 149* 156* 110* 109* 111*   Radiology Studies: No results found.  Scheduled Meds:  amLODipine   10 mg Per Tube Daily   artificial tears   Left Eye Q8H   Chlorhexidine  Gluconate Cloth  6 each Topical Daily   enoxaparin  (LOVENOX ) injection  50 mg Subcutaneous Daily   famotidine   20 mg Per Tube BID   feeding supplement (PROSource TF20)  60 mL Per Tube TID   free water   200 mL Per Tube Q2H   insulin  aspart  0-15 Units Subcutaneous Q4H   metoprolol  tartrate  100 mg Per Tube BID   modafinil   100 mg Per Tube Daily  multivitamin with minerals  1 tablet Per Tube Daily   nutrition supplement (JUVEN)  1 packet Per Tube BID BM   mouth rinse  15 mL Mouth Rinse 4 times per day   Continuous Infusions:  feeding supplement (OSMOLITE 1.5 CAL) 1,000 mL (12/19/23 0630)     LOS: 53 days   Time spent:  Haydee Lipa, DO [facility]  If 7PM-7AM, please contact night-coverage [email or web address]  12/19/2023, 7:16 AM

## 2023-12-19 NOTE — Progress Notes (Signed)
 Occupational Therapy Treatment Patient Details Name: Garrett [name] MRN: [id number] DOB: [birth year] Today's Date: 12/19/2023   History of present illness Pt is a 48 yo male who was found down 10/27/23 with agonal breathing. 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 Intubated 3/8, cortrak placed 3/14, trach placed 3/22, PEG placed 3/24. PMH: HTN, smoker   OT comments  Pt not following commands, not visually tracking or responding to commands, and with decerebrate posturing with bil UE on arrival. Pt repositioned and no change in response to therapist. Requesting SLP consult for communication assessment. Recommendation for skilled inpatient follow up therapy, <3 hours/day with trach care needs.       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 could benefit from communication assessment)    Precautions / Restrictions Precautions Precautions: Fall;Other (comment) Recall of Precautions/Restrictions: Impaired Precaution/Restrictions Comments: trach, PEG, abdominal binder, SBP < 160 Required Braces or Orthoses: Other Brace Splint/Cast: Bil resting hand splints Splint/Cast - Date Prophylactic Dressing Applied (if applicable): 11/02/23 Other Brace: has bilateral prevalon boots Restrictions Weight Bearing Restrictions Per Provider Order: No       Mobility Bed Mobility Overal bed mobility: Needs Assistance             General bed mobility comments: bed tilt used to move to [facility] with total (A) and dependence. pt positioned in chair positiong    Transfers                         Balance                                            ADL either performed or assessed with clinical judgement   ADL                                         General ADL Comments: total for all care    Extremity/Trunk Assessment Upper Extremity Assessment Upper Extremity Assessment: RUE deficits/detail;LUE deficits/detail RUE Deficits / Details: PROM no activation RUE Sensation: decreased light touch;decreased proprioception RUE Coordination: decreased fine motor;decreased gross motor LUE Deficits / Details: PROM no activation LUE Sensation: decreased light touch;decreased proprioception LUE Coordination: decreased fine motor;decreased gross motor   Lower Extremity Assessment Lower Extremity Assessment: Defer to PT evaluation        Vision   Vision Assessment?: Vision impaired- to be further tested in functional context Additional Comments: L eye taped shut, R eye with ocular bobbing appearance   Perception Perception Perception: Not tested   Praxis Praxis Praxis: Not tested   Communication Communication Communication: Impaired   Cognition Arousal: Alert Behavior During Therapy: Flat affect Cognition: Difficult to assess Difficult to assess due to: Impaired communication, Tracheostomy           OT - Cognition Comments: R eye open not following commands                 Following commands: Impaired  Cueing      Exercises Exercises: Other exercises, General Upper Extremity General Exercises - Upper Extremity Shoulder Flexion: PROM, Both, Supine, 20 reps Shoulder Horizontal ABduction: PROM, Both, Supine, 20 reps Shoulder Horizontal ADduction: PROM, Both, Supine, 20 reps Elbow Flexion: PROM, Both, Supine, 20 reps Elbow Extension: PROM, Both, Supine, 20 reps Wrist Flexion: PROM, Both, Supine, 20 reps Wrist Extension: PROM, Both, Supine, 20 reps Digit Composite Flexion: PROM, Both, Supine, 20 reps Composite Extension: PROM, Both,  Supine, 15 reps (noted to have some clonus R UE with extension) Other Exercises Other Exercises: BLE PNF 1 pattern x20 Other Exercises: no visual tracking vertical central or horizontal noted Other Exercises: not command following with L UE noted during session with multiple attempts including two objects in palm. Other Exercises: pt demonstrates decerebrate posture with palms flexed outward adducted to the body with elbow extension. Pt demonstrates increased extension with OT tactile input    Shoulder Instructions       General Comments Trach collar    Pertinent Vitals/ Pain       Pain Assessment Pain Assessment: Faces Pain Score: 0-No pain  Home Living                                          Prior Functioning/Environment              Frequency  Min 1X/week        Progress Toward Goals  OT Goals(current goals can now be found in the care plan section)  Progress towards OT goals: Not progressing toward goals - comment  Acute Rehab OT Goals Patient Stated Goal: unable to participate OT Goal Formulation: Patient unable to participate in goal setting Time For Goal Achievement: 12/27/23 Potential to Achieve Goals: Fair ADL Goals Pt/caregiver will Perform Home Exercise Program: Increased ROM;Both right and left upper extremity Additional ADL Goal #1: Pt will follow 1 step commands with 50% accuracy in a nondistracting enviornment. Additional ADL Goal #2: Pt will complete bed mobility with max assist +2, maintain sitting balance statically for 3 minutes with mod assist. Additional ADL Goal #3: Pt will tolerate Bil resting hand splints on 2 hours/off schedule.  Plan      Co-evaluation                 AM-PAC OT "6 Clicks" Daily Activity     Outcome Measure   Help from another person eating meals?: Total Help from another person taking care of personal grooming?: Total Help from another person toileting, which includes using toliet, bedpan,  or urinal?: Total Help from another person bathing (including washing, rinsing, drying)?: Total Help from another person to put on and taking off regular upper body clothing?: Total Help from another person to put on and taking off regular lower body clothing?: Total 6 Click Score: 6    End of Session Equipment Utilized During Treatment: Oxygen  OT Visit Diagnosis: Other abnormalities of gait and mobility (R26.89);Muscle weakness (generalized) (M62.81);Other symptoms and signs involving the nervous system (R29.898);Other symptoms and signs involving cognitive function;Hemiplegia and hemiparesis Hemiplegia - caused by: Nontraumatic SAH   Activity Tolerance Patient tolerated treatment well   Patient Left in bed;with call bell/phone within reach   Nurse Communication Precautions;Need for lift equipment        Time: 1325 (1335)-1340 OT Time Calculation (min): 15 min  Charges: OT General Charges $OT Visit:  1 Visit OT Treatments $Therapeutic Exercise: 8-22 mins   Brynn, OTR/L  Acute Rehabilitation Services Office: [phone] .   Neomia Banner 12/19/2023, 3:18 PM

## 2023-12-19 NOTE — TOC Progression Note (Signed)
 Transition of Care [facility]) - Progression Note    Patient Details  Name: Garrett [name] MRN: [id number] Date of Birth: 1976-01-19  Transition of Care [facility]) CM/SW Contact  Valery Gaucher, [name] Phone Number: 12/19/2023, 4:34 PM  Clinical Narrative:     Sending referral to SNFs - [facility] needs RN to  document frequency of trach suctioning.   Liddie Reel, MSW, LCSW Clinical Social Worker    Expected Discharge Plan: Long Term Nursing Home Barriers to Discharge: Continued Medical Work up, SNF Pending Medicaid, SNF Pending bed offer, SNF Pending payor source - LOG, Inadequate or no insurance (New trach)  Expected Discharge Plan and Services In-house Referral: Clinical Social Work, Hospice / Palliative Care   Post Acute Care Choice: Skilled Nursing Facility Living arrangements for the past 2 months: Single Family Home                                       Social Determinants of Health (SDOH) Interventions SDOH Screenings   Food Insecurity: Patient Unable To Answer (10/29/2023)  Social Connections: Unknown (06/23/2023)   Received from [facility]  Tobacco Use: High Risk (10/31/2023)    Readmission Risk Interventions     No data to display

## 2023-12-19 NOTE — Plan of Care (Signed)
  Problem: Education: Goal: Knowledge of disease or condition will improve Outcome: Not Progressing Goal: Knowledge of secondary prevention will improve (MUST DOCUMENT ALL) Outcome: Not Progressing Goal: Knowledge of patient specific risk factors will improve (DELETE if not current risk factor) Outcome: Not Progressing   Problem: Intracerebral Hemorrhage Tissue Perfusion: Goal: Complications of Intracerebral Hemorrhage will be minimized Outcome: Not Progressing   Problem: Coping: Goal: Will verbalize positive feelings about self Outcome: Not Progressing Goal: Will identify appropriate support needs Outcome: Not Progressing   Problem: Health Behavior/Discharge Planning: Goal: Ability to manage health-related needs will improve Outcome: Not Progressing Goal: Goals will be collaboratively established with patient/family Outcome: Not Progressing   Problem: Self-Care: Goal: Ability to participate in self-care as condition permits will improve Outcome: Not Progressing Goal: Verbalization of feelings and concerns over difficulty with self-care will improve Outcome: Not Progressing Goal: Ability to communicate needs accurately will improve Outcome: Not Progressing   Problem: Nutrition: Goal: Risk of aspiration will decrease Outcome: Not Progressing Goal: Dietary intake will improve Outcome: Not Progressing

## 2023-12-20 DIAGNOSIS — L8990 Pressure ulcer of unspecified site, unspecified stage: Secondary | ICD-10-CM | POA: Diagnosis not present

## 2023-12-20 DIAGNOSIS — Z930 Tracheostomy status: Secondary | ICD-10-CM | POA: Diagnosis not present

## 2023-12-20 DIAGNOSIS — I629 Nontraumatic intracranial hemorrhage, unspecified: Secondary | ICD-10-CM | POA: Diagnosis not present

## 2023-12-20 DIAGNOSIS — I613 Nontraumatic intracerebral hemorrhage in brain stem: Secondary | ICD-10-CM | POA: Diagnosis not present

## 2023-12-20 DIAGNOSIS — Z7189 Other specified counseling: Secondary | ICD-10-CM | POA: Diagnosis not present

## 2023-12-20 LAB — GLUCOSE, CAPILLARY
Glucose-Capillary: 148 mg/dL — ABNORMAL HIGH (ref 70–99)
Glucose-Capillary: 150 mg/dL — ABNORMAL HIGH (ref 70–99)
Glucose-Capillary: 135 mg/dL — ABNORMAL HIGH (ref 70–99)
Glucose-Capillary: 115 mg/dL — ABNORMAL HIGH (ref 70–99)
Glucose-Capillary: 104 mg/dL — ABNORMAL HIGH (ref 70–99)
Glucose-Capillary: 134 mg/dL — ABNORMAL HIGH (ref 70–99)

## 2023-12-20 LAB — BASIC METABOLIC PANEL WITH GFR
Anion gap: 11 (ref 5–15)
BUN: 26 mg/dL — ABNORMAL HIGH (ref 6–20)
CO2: 25 mmol/L (ref 22–32)
Calcium: 9.3 mg/dL (ref 8.9–10.3)
Chloride: 98 mmol/L (ref 98–111)
Creatinine, Ser: 0.69 mg/dL (ref 0.61–1.24)
GFR, Estimated: >60 mL/min (ref >60)
Glucose, Bld: 128 mg/dL — ABNORMAL HIGH (ref 70–99)
Potassium: 4.2 mmol/L (ref 3.5–5.1)
Sodium: 134 mmol/L — ABNORMAL LOW (ref 135–145)

## 2023-12-20 MED ORDER — FREE WATER
200.0000 mL | Status: DC
  Administered 2023-12-20 – 2024-01-03 (×75): 200 mL

## 2023-12-20 NOTE — Progress Notes (Signed)
 Nutrition Follow-up  DOCUMENTATION CODES:   Not applicable  INTERVENTION:  Continue tube feeds via PEG: - Osmolite 1.5 @ 50 mL/hr (1200 mL/day) - PROSource TF20 60 mL TID - Free water  flush 200 mL every 4 hours or per MD   Tube feeding regimen provides 2040 kcal, 135 grams of protein, and 914 ml of H2O.   Total free water  with flushes: 2114 mL   - Continue MVI with minerals daily per tube   - Continue 1 packet Juven BID per tube to support wound healing, each packet provides 95 calories, 2.5 grams of protein (collagen), and 9.8 grams of carbohydrate  NUTRITION DIAGNOSIS:   Inadequate oral intake related to inability to eat as evidenced by NPO status. - Ongoing, being addressed via tube feeds  GOAL:   Patient will meet greater than or equal to 90% of their needs - Met via tube feeds  MONITOR:   Labs, Weight trends, TF tolerance, Skin  REASON FOR ASSESSMENT:   Consult Enteral/tube feeding initiation and management  ASSESSMENT:   Pt with PMH of HTN, daily mariajuana, and smoker admitted after being found down at home with pontine hemorrhagic stroke and trace SAH. UDS positive for opioids. Noted aspiration PNA on admission.  3/08 - admitted with SAH, intubated 3/09 - adult TF protocol started 3/14 - s/p cortrak placement (tip gastric) 3/22 - s/p tracheostomy 3/24 - s/p PEG 3/26 - emesis, TF paused 3/28 - trickle TF starated 3/31 - TF off due to ileus 4/02 - trickle TF started  4/04 - TF back at goal 4/08 - episode of vomiting x 2, TF paused then resumed at trickle 4/11 - TF back at goal  Pt laying in bed, feeds running at goal at time of visit. Discussed with RN, pt continues to tolerate feeds at this time. Pt worked with SLP on PMV.  MD changed free water  flush to 200 mL q2h on 12/18/23. Discussed with MD today, recommend decreasing free water  flush due to decreased sodium levels.   Admission Weight: 103 kg Current Weight: 99.8 kg (4/25)  Medications: Pepcid ,  NovoLog  SSI, MVI Labs: Sodium 134, Potassium 4.2, BUN 26, Creatinine 0.69 CBG: 121-152 x 24 hrs   UOP: 2525 mL x 24 hrs  Diet Order:   Diet Order             Diet NPO time specified  Diet effective now                   EDUCATION NEEDS:   No education needs have been identified at this time  Skin:  Skin Assessment: Skin Integrity Issues: Skin Integrity Issues:: Other (Comment) Stage II: Stage 2, perineum Other: Non pressure wound, left eye  Last BM:  4/27  Height:   Ht Readings from Last 1 Encounters:  11/22/23 5\' 8"  (1.727 m)    Weight:   Wt Readings from Last 1 Encounters:  No data found for Wt    Ideal Body Weight:  70 kg  BMI:  Body mass index is 33.45 kg/m.  Estimated Nutritional Needs:  Kcal:  2000-2200 Protein:  125-140 grams Fluid:  >2 L/day   Doneta Furbish RD, LDN Clinical Dietitian

## 2023-12-20 NOTE — Progress Notes (Signed)
 NAME:  Garrett [name], MRN:  [id number], DOB:  November 03, 1975, LOS: 54 ADMISSION DATE:  10/27/2023, CONSULTATION DATE:  10/27/2023 REFERRING MD:  Florentino Hurdle, DO CHIEF COMPLAINT:  Found Down  History of Present Illness:  48 y/o male with past medical history of hypertension who was found down for unknown downtime by his fiance when she came home from work. Reportedly, having agonal breathing.  He apparently had driven her to work this morning.  He has HTN but never checks his BP and does not follow with MD.  She says you can tell when his BP is really high; his eyes get blood shot and he is sweating.  No h/o seizures.  Besides almost daily [name] and cigarette smoking, she is not aware of any other drugs. Drug screen positive for opioids and he was given several doses of Narcan . Patient found to have:  1. 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 Basal cisterns and fourth ventricle are effaced without hydrocephalus at this time. 2. Trace additional subarachnoid hemorrhage along the left parietal convexity. BP in ED 262/156. His CXR revealed a RUL and perihilar infiltrates suggesting aspiration pneumonia. ED labs also revealed PH 7.169, LA 4.4, CPK 985. Patient was intubated in ED.  Pertinent  Medical History  hypertension  Significant Hospital Events: Including procedures, antibiotic start and stop dates in addition to other pertinent events   3/8: Intubated in ED, pontine bleed/SAH. CT head 17:09 Acute large hemorrhage 4.1 cm in pons with intraventricular extension without hydrocephalus 3/9: CTA 03:14 AM No change in IPH in pons, similar biparietal [facility] 3/14: Now awake, appears locked in can follow commands with eyes. 3/16 Purposeful with left upper extremity  3/22 tracheostomy placed at bedside, bleeding issues overnight from trach site 3/24 PEG (Dr. Hildy Lowers) 3/24  sputum culture with MRSA 3/27 vomited. TF on hold. Abd film c/w ileus. Added reglan , got SSE. Did tolerate PSV all day  3/28 BMs x2 after SSE day prior. Added back TFs at 1/2 rated. Tolerated PS 3/29 Tolerated PS  3/31 Tolerating CPAP PS 15/5, TF on hold due to ileus. 4/1: con't to hold tube feeds today, restarting vancomycin  and sending tracheal aspirate as peaks are 37, plat 24, driving 19. Thick secretions. Fever overnight.  4/2: peaks improved. Ongoing hiccups. Trickle feeding. Neuro exam unchanged.  4/20: trach was changed to cuffless #8 4/28: not safe to swallow. Limited communication and he follows simple commands. On TF 5/1: on TC 28%, with large secretions. Working with speech and he is improving to initiate PMV trials under ST supervision    Interim History / Subjective:   Has been doing well on trach collar  Objective   Blood pressure (!) 143/110, pulse 89, temperature 98.8 F (37.1 C), temperature source Axillary, resp. rate 17, height 5\' 8"  (1.727 m), weight 99.8 kg, SpO2 97%.    FiO2 (%):  [21 %] 21 %   Intake/Output Summary (Last 24 hours) at 12/20/2023 1558 Last data filed at 12/20/2023 0530 Gross per 24 hour  Intake --  Output 2525 ml  Net -2525 ml   Filed Weights   12/13/23 0500 12/14/23 0500  Weight: 99.6 kg 99.8 kg   Physical Exam: General: middle aged male, lying in bed, no distress minimally responsive. On TC 28% HEENT: Tracheostomy in place. Minimal secretions. Chest: Clear bilaterally CV: HS normal  Abdomen: PEG in place.  Extremities: No edema. Neuro: eyes open,  tracks. Trying to squeeze hands on commands.   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   - PMV and swallow eval per ST.  - D/w RT. Will downsize to Shiley cuffless #6 XLT proximal  - Continue trach collar - PRN duonebs and  suctionioning   Madelynn Schilder, M.D. [facility] Pulmonary/Critical Care Medicine  See Amion for personal pager PCCM on call pager [phone] until 7pm. Please call Elink 7p-7a. [phone]

## 2023-12-20 NOTE — Progress Notes (Signed)
 Trach tube downsized from #8 cuffless tube to #6 cuffless trach tube.

## 2023-12-20 NOTE — Evaluation (Addendum)
 Passy-Muir Speaking Valve - Evaluation Patient Details  Name: Garrett [name] MRN: [id number] Date of Birth: [birth year]  Today's Date: 12/20/2023 Time: 1122-1147 SLP Time Calculation (min) (ACUTE ONLY): 25 min  HPI:  Pt is a 48 yo male who was found down 10/27/23 with agonal breathing. 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 Intubated 3/8, cortrak placed 3/14, trach placed 3/22, PEG placed 3/24. PMH: HTN, smoker    Assessment / Plan / Recommendation  Clinical Impression  Pt participated in preliminary Passy-Muir assessment. He has an uncuffed #8 trach. He was alert and responding to simple commands with left thumb, left toes, mouth and right eye.  PMV was placed initially for 1-2 breath cycles, then 4-6 as session progressed.  Initial placement led to coughing and some secretions were brought into oral cavity and suctioned with [place],  After bouts of coughing/expulsion of secretions, he was able to achieve sustained phonation on command. Although wet, low volume, it appeared to be intentional. D/W RN. Pt is new to SLP - will reach out to critical care to inquire about any potential for downsizing trach. For now, PMV use with SLP only.  Will follow.  Addendum:  Communicated with Dr. Marene Shape, Critical Care, who determined pt is appropriate for downsize to a #6.    SLP Visit Diagnosis: Aphonia (R49.1)    SLP Assessment  Patient needs continued Speech Lanaguage Pathology Services    Recommendations for follow up therapy are one component of a multi-disciplinary discharge planning process, led by the attending physician.  Recommendations may be updated based on patient status, additional functional criteria and insurance authorization.  Follow Up Recommendations  Other (comment) (tba)    Assistance Recommended at  Discharge Frequent or constant Supervision/Assistance      Frequency and Duration min 2x/week  4 weeks    PMSV Trial PMSV was placed for: intervals of 4-6 breath cycles Able to redirect subglottic air through upper airway: Yes Able to Attain Phonation: Yes Voice Quality: Hoarse;Low vocal intensity;Wet Able to Expectorate Secretions: Yes Level of Secretion Expectoration with PMSV: Oral;Tracheal Breath Support for Phonation: Moderately decreased Intelligibility: Unable to assess (comment) SpO2 During Trial: 92 %   Tracheostomy Tube       Vent Dependency  Vent Dependent: No FiO2 (%): 21 %    Cuff Deflation Trial    Garrett [name] L. Garrett Lincoln, MA CCC/SLP Clinical Specialist - Acute Care SLP Acute Rehabilitation Services Office number [phone]        Garrett [name] 12/20/2023, 1:02 PM

## 2023-12-20 NOTE — Progress Notes (Addendum)
 PROGRESS NOTE    Garrett [name]  WJX:[id number] DOB: 1976-02-18 DOA: 10/27/2023 PCP: Pcp, No   Brief Narrative:  Patient is a 48 year old male with no known medical history.  Patient found down on 10/27/2023 unresponsive with UDS positive for opiates, minimal responsiveness to Narcan .  Found to be markedly hypertensive at 260/156.  CT head at intake shows 4.1 cm intracranial hemorrhage at the pons with intraventricular extension into the fourth ventricle and basal cisterns.  Initially admitted by critical care, now extubated to tracheostomy which was placed 3/22.  PEG placed 3/24.  Patient continues to be medically stable for discharge at this time, he does carry high risk for complications in the setting of his need for total care.  Questionable aspiration pneumonia noted during hospitalization now status posttreatment.  Disposition remains difficult given patient's needs and family understands his poor prognosis, as such patient was made DNR per discussion with family as they would not want any further aggressive or heroic measures to be taken.  Patient remains medically stable for discharge patient's Medicaid should be updated in June/July which should assist with placement.    Major events: 3/8: Intubated in ED, pontine bleed/SAH. CT head 17:09 Acute large hemorrhage 4.1 cm in pons with intraventricular extension without hydrocephalus 3/9: CTA 03:14 AM No change in IPH in pons, similar biparietal [facility] 3/14: Now awake, appears locked in can follow commands with eyes. 3/16 Purposeful with left upper extremity  3/22 tracheostomy placed at bedside, bleeding issues overnight from trach site 3/24 PEG (Dr. Hildy Lowers) 3/24 sputum culture with MRSA 3/27 vomited. TF on hold. Abd film c/w ileus. Added reglan , got SSE. Did tolerate PSV all day  3/28 BMs x2 after SSE day prior. Added back TFs at 1/2 rated. Tolerated PS 3/29 Tolerated PS  3/31 Tolerating CPAP PS 15/5, TF on hold due to ileus. 4/1:  con't to hold tube feeds today, restarting vancomycin  and sending tracheal aspirate as peaks are 37, plat 24, driving 19. Thick secretions. Fever overnight.  4/2: peaks improved. Ongoing hiccups. Trickle feeding. Neuro exam unchanged.  4/20: trach was changed to cuffless #8 4/28 - present: Mental status appears to be somewhat improving off narcotics, more awake alert following simple commands(able to grimace, move left toes, track vertically with right eye).  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 Catastrophic injury Repeat CT head 3/9,3/18 and 3/29 with decreased size Echo EF 60-65% A1c 4.7 LDL 96 On modafinil  100; discontinue IV narcotics with improving mentation over the past 48 hours   Acute respiratory failure  In the setting of large pontine stroke/brainstem compression Unable to protect airway Trach placed 11/10/23 changed to cuffless 4/21 Pulmonology following -no indication to downsize at this time per most recent note *speech requesting reevaluation for downsizing given moderately successful attempt with Passy-Muir  Aspiration pneumonia, multifactorial, resolving Complicated by recently placed trach (11/10/23) Dysphagia in the setting of CVA and ongoing dysphagia  SLP continues to follow as above Completed vancomycin /linezolid  12/04/2023  Moderate to severe protein caloric malnutrition, POA Continue Osmolite, Prosource, free water , defer to dietary with witness Osmolite 50 cc/H,  Prosource 60cc 3 times daily Free water   management as below   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 Recommendations from ophthalmology 4/7-(4/7): maxitrol ointment TID x1 week followed by Refresh PM TID-QID for chronic lubrication; can tape eyelid closed if exposure is still an issue   AKI earlier in hospital stay with acute urinary retention Foley replaced 4/13 will need to continue; prior removals/voiding trial unsuccessful Free water  decreased to 200 q4h given minimally downtrending sodium, continue to follow, appreciate dietary insight recommendations   Macrocytic anemia thrombocytosis Overall stable/resolved Perineal skin injury, POA - Continue Gerhard's Butt cream   DVT prophylaxis: SCDs Start: 10/27/23 2226 Code Status:   Code Status: Do not attempt resuscitation (DNR) PRE-ARREST INTERVENTIONS DESIRED Family Communication: None present  Status is: Inpatient  Dispo: The patient is from: Home              Anticipated d/c is to: Facility              Anticipated d/c date is: Imminent              Patient currently is medically stable for discharge  Consultants:  PCCM, palliative care, general surgery, ophthalmology  Antimicrobials:  None currently  Subjective: No acute issues or events reported overnight, review of systems otherwise quite limited by patient's mental status.  Objective: Vitals:   12/19/23 2019 12/19/23 2025 12/19/23 2308 12/20/23 0415  BP: (!) 141/95  132/85 119/87  Pulse: 93 91 87 78  Resp: 18 16 13 13   Temp: 99 F (37.2 C)  98.5 F (36.9 C) 98.4 F (36.9 C)  TempSrc: Axillary  Axillary Oral  SpO2: 98% 98% 97% 98%  Weight:      Height:        Intake/Output Summary (Last 24 hours) at 12/20/2023 0703 Last data filed at 12/19/2023 2313 Gross per 24 hour  Intake --  Output 2025 ml  Net -2025 ml   Filed Weights   12/13/23 0500 12/14/23 0500  Weight: 99.6 kg 99.8 kg    Examination:  General: Resting comfortably in bed, no acute distress, poorly interactive  Neuro: Flaccid paralysis diffusely -patient able to  track vertically with right eye to command, patient able to move left great toe to command, attempted to smile to command HEENT: Left eye closed, right eye able to track motion vertically but not horizontally Neck: Tracheostomy noted. Lungs: Diminished bilaterally without overt wheezes rales or rhonchi. Heart:  Regular rate and rhythm.  Abdomen:  Soft, nondistended.  PEG noted clean dry intact  Data Reviewed: I have personally reviewed following labs and imaging studies  CBC: Recent Labs  Lab 12/14/23 0909 12/18/23 0921  WBC 9.4 10.2  HGB 11.1* 11.3*  HCT 34.9* 35.9*  MCV 97.2 97.0  PLT 444* 561*   Basic Metabolic Panel: Recent Labs  Lab 12/14/23 0909 12/17/23 0920 12/18/23 0921  NA 136 138 137  K 3.6 3.8 3.8  CL 101 98 101  CO2 25 24 25   GLUCOSE 134* 165* 128*  BUN 27* 26* 25*  CREATININE 0.80 0.79 0.69  CALCIUM 9.1 9.4 9.2   GFR: Estimated Creatinine Clearance: 130.8 mL/min (by C-G formula based on SCr of 0.69 mg/dL).  CBG: Recent Labs  Lab 12/19/23 0810 12/19/23 1125 12/19/23 1601 12/19/23 2018 12/19/23 2309  GLUCAP 133* 125* 152* 136* 121*   Radiology Studies: No results found.  Scheduled Meds:  amLODipine   10 mg Per Tube Daily   artificial tears   Left Eye Q8H   Chlorhexidine  Gluconate Cloth  6 each Topical Daily  enoxaparin  (LOVENOX ) injection  50 mg Subcutaneous Daily   famotidine   20 mg Per Tube BID   feeding supplement (PROSource TF20)  60 mL Per Tube TID   free water   200 mL Per Tube Q2H   insulin  aspart  0-15 Units Subcutaneous Q4H   metoprolol  tartrate  100 mg Per Tube BID   modafinil   100 mg Per Tube Daily   multivitamin with minerals  1 tablet Per Tube Daily   nutrition supplement (JUVEN)  1 packet Per Tube BID BM   mouth rinse  15 mL Mouth Rinse 4 times per day   Continuous Infusions:  feeding supplement (OSMOLITE 1.5 CAL) 1,000 mL (12/19/23 2329)     LOS: 54 days   Time spent:  Haydee Lipa, DO [facility]  If 7PM-7AM, please contact night-coverage [email or web address]  12/20/2023, 7:03 AM

## 2023-12-20 NOTE — Evaluation (Signed)
 Speech Language Pathology Evaluation Patient Details Name: Jakel Crouch MRN: [id number] DOB: [birth year] Today's Date: 12/20/2023 Time: 4098-1191 SLP Time Calculation (min) (ACUTE ONLY): 25 min  Problem List:  Patient Active Problem List   Diagnosis Date Noted   On mechanically assisted ventilation (HCC) 11/22/2023   Advanced care planning/counseling discussion 11/22/2023   Tracheostomy dependence (HCC) 11/22/2023   Pressure injury of skin 11/22/2023   ICH (intracerebral hemorrhage) (HCC) 10/27/2023   Intracranial hemorrhage (HCC) 10/27/2023   Past Medical History: History reviewed. No pertinent past medical history. Past Surgical History: History reviewed. No pertinent surgical history. HPI:  Pt is a 48 yo male who was found down 10/27/23 with agonal breathing. 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 Intubated 3/8, cortrak placed 3/14, trach placed 3/22, PEG placed 3/24. PMH: HTN, smoker   Assessment / Plan / Recommendation Clinical Impression  Pt participated in initial communication assessment.  Notes indicate that he is intermittently using blinking patterns to provide yes/no responses, but during today's assessment blinking of right eye was incomplete and inconsistent.  He was able to vocalize on command with PMV in place, but unable to produce any consonants nor alter vowel productions.  He achieved lip movement on right when prompted and did so reliably.  He was able to discriminate between common objects when held vertically in line of vision by looking at each item when directed (e.g., "look at the _____). He completed this with 10 items and 100% accuracy.  Pt followed simple commands with left thumb, left toes, eye gaze patterns.  Mr. Chatwin would benefit from SLP intervention to explore some modes of  communication now that he is more reliably alert.  D/W RN and Dr. Rudine Cos.  Will follow.    SLP Assessment  SLP Recommendation/Assessment: Patient needs continued Speech Lanaguage Pathology Services SLP Visit Diagnosis: Cognitive communication deficit (R41.841)    Recommendations for follow up therapy are one component of a multi-disciplinary discharge planning process, led by the attending physician.  Recommendations may be updated based on patient status, additional functional criteria and insurance authorization.    Follow Up Recommendations  SLP at [facility] acute care hospital    Assistance Recommended at Discharge  Frequent or constant Supervision/Assistance  Functional Status Assessment    Frequency and Duration min 2x/week  4 weeks      SLP Evaluation Cognition  Overall Cognitive Status: Impaired/Different from baseline Arousal/Alertness: Awake/alert Attention: Sustained Sustained Attention: Appears intact       Comprehension  Auditory Comprehension Commands: Within Functional Limits (single step) Visual Recognition/Discrimination Discrimination: Within Function Limits (discriminated between two objects held vertically, 100% of trials) Reading Comprehension Reading Status: Not tested    Expression Expression Primary Mode of Expression: Other (comment) (non-verbal; trach) Verbal Expression Overall Verbal Expression: Other (comment) (language to be assessed)   Oral / Motor  Oral Motor/Sensory Function Overall Oral Motor/Sensory Function: Moderate impairment Facial ROM: Reduced left;Suspected CN VII (facial) dysfunction Facial Symmetry: Abnormal symmetry left;Suspected CN VII (facial) dysfunction Lingual ROM: Other (Comment) (poor extension) Lingual Symmetry: Abnormal symmetry right;Abnormal symmetry left;Suspected CN XII (hypoglossal) dysfunction Motor Speech Overall Motor Speech: Impaired Phonation: Low vocal intensity;Hoarse;Wet (with PMV in  place) Articulation:  (no attempts to articulate) Intelligibility: Unable to assess (comment)            Rheba Cedar 12/20/2023, 2:05 PM Emi Lymon L. Desarie Feild, MA CCC/SLP  Clinical Specialist - Acute Care SLP Acute Rehabilitation Services Office number [phone]

## 2023-12-21 DIAGNOSIS — Z7189 Other specified counseling: Secondary | ICD-10-CM | POA: Diagnosis not present

## 2023-12-21 DIAGNOSIS — I629 Nontraumatic intracranial hemorrhage, unspecified: Secondary | ICD-10-CM | POA: Diagnosis not present

## 2023-12-21 DIAGNOSIS — Z930 Tracheostomy status: Secondary | ICD-10-CM | POA: Diagnosis not present

## 2023-12-21 DIAGNOSIS — I613 Nontraumatic intracerebral hemorrhage in brain stem: Secondary | ICD-10-CM | POA: Diagnosis not present

## 2023-12-21 DIAGNOSIS — L8990 Pressure ulcer of unspecified site, unspecified stage: Secondary | ICD-10-CM | POA: Diagnosis not present

## 2023-12-21 DIAGNOSIS — Z9911 Dependence on respirator [ventilator] status: Secondary | ICD-10-CM | POA: Diagnosis not present

## 2023-12-21 LAB — GLUCOSE, CAPILLARY
Glucose-Capillary: 141 mg/dL — ABNORMAL HIGH (ref 70–99)
Glucose-Capillary: 127 mg/dL — ABNORMAL HIGH (ref 70–99)
Glucose-Capillary: 130 mg/dL — ABNORMAL HIGH (ref 70–99)
Glucose-Capillary: 114 mg/dL — ABNORMAL HIGH (ref 70–99)
Glucose-Capillary: 123 mg/dL — ABNORMAL HIGH (ref 70–99)
Glucose-Capillary: 165 mg/dL — ABNORMAL HIGH (ref 70–99)

## 2023-12-21 NOTE — Progress Notes (Signed)
 Speech Language Pathology Treatment: Garrett [name] Speaking valve  Patient Details Name: Garrett [name] MRN: [id number] DOB: 1976-03-28 Today's Date: 12/21/2023 Time: 7829-5621 SLP Time Calculation (min) (ACUTE ONLY): 16 min  Assessment / Plan / Recommendation Clinical Impression  Pt's trach recently downsized to Shiley 6 cuffless.  Pt tolerated 12 mins of PMV placement with direct supervision. He made no attempts to communicate or follow simple commands despite max verbal and tactile encouragement. No attempts to communicate with eye blink method. Pt tolerated PMV placement without significant change in vitals or evidence of back pressure when briefly doffed x2. PMV doffed prior to SLP departure.   Recommend pt wear PMV throughout the day during therapies or with staff present.  Staff should doff PMV when not able to supervise. SLP will follow up for PMV education and training with family. Consider orders for swallow evaluation as indicated.    HPI HPI: Pt is a 48 yo male who was found down 10/27/23 with agonal breathing. 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 Intubated 3/8, cortrak placed 3/14, trach placed 3/22, PEG placed 3/24. PMH: HTN, smoker      SLP Plan  Continue with current plan of care      Recommendations for follow up therapy are one component of a multi-disciplinary discharge planning process, led by the attending physician.  Recommendations may be updated based on patient status, additional functional criteria and insurance authorization.    Recommendations         Patient may use Passy-Muir Speech Valve: During all therapies with supervision PMSV Supervision: Full           Oral care QID   Frequent or constant Supervision/Assistance Cognitive communication deficit (H08.657)     Continue with  current plan of care     Garrett [name]  12/21/2023, 3:23 PM

## 2023-12-21 NOTE — Progress Notes (Addendum)
 PROGRESS NOTE    Garrett [name]  WUJ:[id number] DOB: [birth year] DOA: 10/27/2023 PCP: Pcp, No   Brief Narrative:  Patient is a 48 year old male with no known medical history.  Patient found down on 10/27/2023 unresponsive with UDS positive for opiates, minimal responsiveness to Narcan .  Found to be markedly hypertensive at 260/156.  CT head at intake shows 4.1 cm intracranial hemorrhage at the pons with intraventricular extension into the fourth ventricle and basal cisterns.  Initially admitted by critical care, now extubated to tracheostomy which was placed 3/22.  PEG placed 3/24.  Patient continues to be medically stable for discharge at this time, he does carry high risk for complications in the setting of his need for total care.  Questionable aspiration pneumonia noted during hospitalization now status posttreatment.  Disposition remains difficult given patient's needs and family understands his poor prognosis, as such patient was made DNR per discussion with family as they would not want any further aggressive or heroic measures to be taken.  Patient remains medically stable for discharge patient's Medicaid should be updated in June/July which should assist with placement.    Major events: 3/8: Intubated in ED, pontine bleed/SAH. CT head 17:09 Acute large hemorrhage 4.1 cm in pons with intraventricular extension without hydrocephalus 3/9: CTA 03:14 AM No change in IPH in pons, similar biparietal [facility] 3/14: Now awake, appears locked in can follow commands with eyes. 3/16 Purposeful with left upper extremity  3/22 tracheostomy placed at bedside, bleeding issues overnight from trach site 3/24 PEG (Dr. Hildy Lowers) 3/24 sputum culture with MRSA 3/27 vomited. TF on hold. Abd film c/w ileus. Added reglan , got SSE. Did tolerate PSV all day  3/28 BMs x2 after SSE day prior. Added back TFs at 1/2 rated. Tolerated PS 3/29 Tolerated PS  3/31 Tolerating CPAP PS 15/5, TF on hold due to ileus. 4/1:  con't to hold tube feeds today, restarting vancomycin  and sending tracheal aspirate as peaks are 37, plat 24, driving 19. Thick secretions. Fever overnight.  4/2: peaks improved. Ongoing hiccups. Trickle feeding. Neuro exam unchanged.  4/20: trach was changed to cuffless #8 5/1: Mental status appears to be somewhat improving off narcotics, more awake alert following simple commands(able to grimace, move left toes, track vertically with right eye).  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 Catastrophic injury Repeat CT head 3/9,3/18 and 3/29 with decreased size Echo EF 60-65% A1c 4.7 LDL 96 On modafinil  100; continue to hold IV narcotics given improving mentation over the past 48 hours   Acute respiratory failure  In the setting of large pontine stroke/brainstem compression Unable to protect airway Trach placed 11/10/23 changed to cuffless 4/21 Pulmonology following -no indication to downsize at this time per most recent note *speech requesting reevaluation for downsizing given moderately successful attempt with Passy-Muir 12/20/23  Aspiration pneumonia, multifactorial, resolving Complicated by recently placed trach (11/10/23) Dysphagia in the setting of CVA and ongoing dysphagia  SLP continues to follow as above Completed vancomycin /linezolid  12/04/2023  Moderate to severe protein caloric malnutrition, POA Continue Osmolite, Prosource, free water , defer to dietary with witness Osmolite 50  cc/H,  Prosource 60cc 3 times daily Free  water  management as below   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 Recommendations from ophthalmology 4/7-(4/7): maxitrol ointment TID x1 week followed by Refresh PM TID-QID for chronic lubrication; can tape eyelid closed if exposure is still an issue   AKI earlier in hospital stay with acute urinary retention Foley replaced 4/13 will need to continue; prior removals/voiding trial unsuccessful Free water  decreased to 200 q4h given minimally downtrending sodium, continue to follow, appreciate dietary insight recommendations   Macrocytic anemia thrombocytosis Overall stable/resolved Perineal skin injury, POA - Continue Gerhard's Butt cream   DVT prophylaxis: SCDs Start: 10/27/23 2226 Code Status:   Code Status: Do not attempt resuscitation (DNR) PRE-ARREST INTERVENTIONS DESIRED Family Communication: None present  Status is: Inpatient  Dispo: The patient is from: Home              Anticipated d/c is to: Facility              Anticipated d/c date is: Imminent              Patient currently is medically stable for discharge  Consultants:  PCCM, palliative care, general surgery, ophthalmology  Antimicrobials:  None currently  Subjective: No acute issues or events reported overnight, review of systems otherwise quite limited by patient's mental status.  Objective: Vitals:   12/20/23 2312 12/20/23 2338 12/21/23 0351 12/21/23 0410  BP: (!) 138/95  (!) 150/90   Pulse: 82 81 84 81  Resp: 20 18 14 13   Temp: 98.7 F (37.1 C)  99.1 F (37.3 C)   TempSrc: Oral  Oral   SpO2: 96% 96% 96% 98%  Weight:      Height:        Intake/Output Summary (Last 24 hours) at 12/21/2023 0716 Last data filed at 12/21/2023 0348 Gross per 24 hour  Intake --  Output 1250 ml  Net -1250 ml   Filed Weights   12/13/23 0500 12/14/23 0500  Weight: 99.6 kg 99.8 kg    Examination:  General: Resting comfortably in bed, no acute distress, poorly interactive  Neuro: Flaccid paralysis diffusely -patient  able to track vertically with right eye to command, patient able to move left great toe to command, attempted to smile to command HEENT: Left eye closed, right eye able to track motion vertically but not horizontally Neck: Tracheostomy noted. Lungs: Diminished bilaterally without overt wheezes rales or rhonchi. Heart:  Regular rate and rhythm.  Abdomen:  Soft, nondistended.  PEG noted clean dry intact  Data Reviewed: I have personally reviewed following labs and imaging studies  CBC: Recent Labs  Lab 12/14/23 0909 12/18/23 0921  WBC 9.4 10.2  HGB 11.1* 11.3*  HCT 34.9* 35.9*  MCV 97.2 97.0  PLT 444* 561*   Basic Metabolic Panel: Recent Labs  Lab 12/14/23 0909 12/17/23 0920 12/18/23 0921 12/20/23 1216  NA 136 138 137 134*  K 3.6 3.8 3.8 4.2  CL 101 98 101 98  CO2 25 24 25 25   GLUCOSE 134* 165* 128* 128*  BUN 27* 26* 25* 26*  CREATININE 0.80 0.79 0.69 0.69  CALCIUM 9.1 9.4 9.2 9.3   GFR: Estimated Creatinine Clearance: 130.8 mL/min (by C-G formula based on SCr of 0.69 mg/dL).  CBG: Recent Labs  Lab 12/20/23 1122 12/20/23 1615 12/20/23 1948 12/20/23 2314 12/21/23 0346  GLUCAP 135* 115* 104* 134* 141*   Radiology Studies: No results found.  Scheduled Meds:  amLODipine   10 mg Per Tube Daily  artificial tears   Left Eye Q8H   Chlorhexidine  Gluconate Cloth  6 each Topical Daily   enoxaparin  (LOVENOX ) injection  50 mg Subcutaneous Daily   famotidine   20 mg Per Tube BID   feeding supplement (PROSource TF20)  60 mL Per Tube TID   free water   200 mL Per Tube Q4H   insulin  aspart  0-15 Units Subcutaneous Q4H   metoprolol  tartrate  100 mg Per Tube BID   modafinil   100 mg Per Tube Daily   multivitamin with minerals  1 tablet Per Tube Daily   nutrition supplement (JUVEN)  1 packet Per Tube BID BM   mouth rinse  15 mL Mouth Rinse 4 times per day   Continuous Infusions:  feeding supplement (OSMOLITE 1.5 CAL) 1,000 mL (12/20/23 2134)     LOS: 55 days   Time  spent:  Haydee Lipa, DO [facility]  If 7PM-7AM, please contact night-coverage [email or web address]  12/21/2023, 7:16 AM

## 2023-12-21 NOTE — Plan of Care (Signed)
  Problem: Education: Goal: Knowledge of disease or condition will improve Outcome: Progressing Goal: Knowledge of secondary prevention will improve (MUST DOCUMENT ALL) Outcome: Progressing Goal: Knowledge of patient specific risk factors will improve (DELETE if not current risk factor) Outcome: Progressing   Problem: Intracerebral Hemorrhage Tissue Perfusion: Goal: Complications of Intracerebral Hemorrhage will be minimized Outcome: Progressing   Problem: Coping: Goal: Will verbalize positive feelings about self Outcome: Progressing Goal: Will identify appropriate support needs Outcome: Progressing   Problem: Health Behavior/Discharge Planning: Goal: Ability to manage health-related needs will improve Outcome: Progressing Goal: Goals will be collaboratively established with patient/family Outcome: Progressing   Problem: Self-Care: Goal: Ability to participate in self-care as condition permits will improve Outcome: Progressing Goal: Verbalization of feelings and concerns over difficulty with self-care will improve Outcome: Progressing Goal: Ability to communicate needs accurately will improve Outcome: Progressing   Problem: Nutrition: Goal: Risk of aspiration will decrease Outcome: Progressing Goal: Dietary intake will improve Outcome: Progressing   Problem: Education: Goal: Ability to describe self-care measures that may prevent or decrease complications (Diabetes Survival Skills Education) will improve Outcome: Progressing   Problem: Coping: Goal: Ability to adjust to condition or change in health will improve Outcome: Progressing   Problem: Fluid Volume: Goal: Ability to maintain a balanced intake and output will improve Outcome: Progressing   Problem: Health Behavior/Discharge Planning: Goal: Ability to identify and utilize available resources and services will improve Outcome: Progressing Goal: Ability to manage health-related needs will improve Outcome:  Progressing   Problem: Metabolic: Goal: Ability to maintain appropriate glucose levels will improve Outcome: Progressing   Problem: Nutritional: Goal: Maintenance of adequate nutrition will improve Outcome: Progressing Goal: Progress toward achieving an optimal weight will improve Outcome: Progressing   Problem: Skin Integrity: Goal: Risk for impaired skin integrity will decrease Outcome: Progressing   Problem: Tissue Perfusion: Goal: Adequacy of tissue perfusion will improve Outcome: Progressing   Problem: Education: Goal: Knowledge of General Education information will improve Description: Including pain rating scale, medication(s)/side effects and non-pharmacologic comfort measures Outcome: Progressing   Problem: Health Behavior/Discharge Planning: Goal: Ability to manage health-related needs will improve Outcome: Progressing   Problem: Clinical Measurements: Goal: Ability to maintain clinical measurements within normal limits will improve Outcome: Progressing Goal: Will remain free from infection Outcome: Progressing Goal: Diagnostic test results will improve Outcome: Progressing Goal: Respiratory complications will improve Outcome: Progressing Goal: Cardiovascular complication will be avoided Outcome: Progressing   Problem: Activity: Goal: Risk for activity intolerance will decrease Outcome: Progressing   Problem: Nutrition: Goal: Adequate nutrition will be maintained Outcome: Progressing   Problem: Coping: Goal: Level of anxiety will decrease Outcome: Progressing   Problem: Elimination: Goal: Will not experience complications related to bowel motility Outcome: Progressing Goal: Will not experience complications related to urinary retention Outcome: Progressing   Problem: Pain Managment: Goal: General experience of comfort will improve and/or be controlled Outcome: Progressing   Problem: Safety: Goal: Ability to remain free from injury will  improve Outcome: Progressing   Problem: Skin Integrity: Goal: Risk for impaired skin integrity will decrease Outcome: Progressing   Problem: Education: Goal: 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 Outcome: Progressing   Problem: Activity: Goal: Ability to tolerate increased activity will improve Outcome: Progressing   Problem: Health Behavior/Discharge Planning: Goal: Ability to manage tracheostomy will improve Outcome: Progressing   Problem: Respiratory: Goal: Patent airway maintenance will improve Outcome: Progressing   Problem: Role Relationship: Goal: Ability to communicate will improve Outcome: Progressing

## 2023-12-21 NOTE — Progress Notes (Signed)
 2113 pt partner alerted nurse that pt. Coughed up a small clot of blood. This RN and charge RN came to look at clot and check the pt. He was not in distress at this time and blood clot was small.

## 2023-12-21 NOTE — Plan of Care (Signed)
  Problem: Skin Integrity: Goal: Risk for impaired skin integrity will decrease Outcome: Progressing   

## 2023-12-22 DIAGNOSIS — I629 Nontraumatic intracranial hemorrhage, unspecified: Secondary | ICD-10-CM | POA: Diagnosis not present

## 2023-12-22 DIAGNOSIS — Z930 Tracheostomy status: Secondary | ICD-10-CM | POA: Diagnosis not present

## 2023-12-22 DIAGNOSIS — Z9911 Dependence on respirator [ventilator] status: Secondary | ICD-10-CM

## 2023-12-22 DIAGNOSIS — I613 Nontraumatic intracerebral hemorrhage in brain stem: Secondary | ICD-10-CM | POA: Diagnosis not present

## 2023-12-22 DIAGNOSIS — Z7189 Other specified counseling: Secondary | ICD-10-CM | POA: Diagnosis not present

## 2023-12-22 DIAGNOSIS — L8990 Pressure ulcer of unspecified site, unspecified stage: Secondary | ICD-10-CM | POA: Diagnosis not present

## 2023-12-22 LAB — GLUCOSE, CAPILLARY
Glucose-Capillary: 99 mg/dL (ref 70–99)
Glucose-Capillary: 122 mg/dL — ABNORMAL HIGH (ref 70–99)
Glucose-Capillary: 125 mg/dL — ABNORMAL HIGH (ref 70–99)
Glucose-Capillary: 109 mg/dL — ABNORMAL HIGH (ref 70–99)
Glucose-Capillary: 121 mg/dL — ABNORMAL HIGH (ref 70–99)
Glucose-Capillary: 132 mg/dL — ABNORMAL HIGH (ref 70–99)

## 2023-12-22 NOTE — Progress Notes (Signed)
 PROGRESS NOTE    Garrett [name]  ZOX:[id number] DOB: Jun 16, 1976 DOA: 10/27/2023 PCP: Pcp, No   Brief Narrative:  Patient is a 48 year old male with no known medical history.  Patient found down on 10/27/2023 unresponsive with UDS positive for opiates, minimal responsiveness to Narcan .  Found to be markedly hypertensive at 260/156.  CT head at intake shows 4.1 cm intracranial hemorrhage at the pons with intraventricular extension into the fourth ventricle and basal cisterns.  Initially admitted by critical care, now extubated to tracheostomy which was placed 3/22.  PEG placed 3/24.  Patient continues to be medically stable for discharge at this time, he does carry high risk for complications in the setting of his need for total care.  Questionable aspiration pneumonia noted during hospitalization now status posttreatment.  Disposition remains difficult given patient's needs and family understands his poor prognosis, as such patient was made DNR per discussion with family as they would not want any further aggressive or heroic measures to be taken.  Patient remains medically stable for discharge patient's Medicaid should be updated in June/July which should assist with placement.    Major events: 3/8: Intubated in ED, pontine bleed/SAH. CT head 17:09 Acute large hemorrhage 4.1 cm in pons with intraventricular extension without hydrocephalus 3/9: CTA 03:14 AM No change in IPH in pons, similar biparietal [facility] 3/14: Now awake, appears locked in can follow commands with eyes. 3/16 Purposeful with left upper extremity  3/22 tracheostomy placed at bedside, bleeding issues overnight from trach site 3/24 PEG (Dr. Hildy Lowers) 3/24 sputum culture with MRSA 3/27 vomited. TF on hold. Abd film c/w ileus. Added reglan , got SSE. Did tolerate PSV all day  3/28 BMs x2 after SSE day prior. Added back TFs at 1/2 rated. Tolerated PS 3/29 Tolerated PS  3/31 Tolerating CPAP PS 15/5, TF on hold due to ileus. 4/1:  con't to hold tube feeds today, restarting vancomycin  and sending tracheal aspirate as peaks are 37, plat 24, driving 19. Thick secretions. Fever overnight.  4/2: peaks improved. Ongoing hiccups. Trickle feeding. Neuro exam unchanged.  4/20: trach was changed to cuffless #8 5/1: Mental status appears to be somewhat improving off narcotics, more awake alert following simple commands(able to grimace, move left toes, track vertically with right eye).  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 Catastrophic injury Repeat CT head 3/9,3/18 and 3/29 with decreased size Echo EF 60-65% A1c 4.7 LDL 96 On modafinil  100; continue to hold IV narcotics given improving mentation over the past 48 hours   Acute respiratory failure  In the setting of large pontine stroke/brainstem compression Unable to protect airway Trach placed 11/10/23 changed to cuffless 4/21 Pulmonology following - 5/1: Shiley cuffless #6 XLT proximal   Aspiration pneumonia, multifactorial, resolving Complicated by recently placed trach (11/10/23) Dysphagia in the setting of CVA and ongoing dysphagia  SLP continues to follow as above Completed vancomycin /linezolid  12/04/2023  Moderate to severe protein caloric malnutrition, POA Continue Osmolite, Prosource, free water , defer to dietary with witness Osmolite 50 cc/H,  Prosource 60cc 3 times daily Free water  management as below   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 Recommendations from ophthalmology 4/7-(4/7): maxitrol  ointment TID x1 week followed by Refresh PM TID-QID for chronic lubrication; can tape eyelid closed if exposure is still an issue   AKI earlier in hospital stay with acute urinary retention Foley replaced 4/13 will need to continue; prior removals/voiding trial unsuccessful Free water  decreased to 200 q4h given minimally downtrending sodium, continue to follow, appreciate dietary insight recommendations   Macrocytic anemia thrombocytosis Overall stable/resolved Perineal skin injury, POA - Continue Gerhard's Butt cream   DVT prophylaxis: SCDs Start: 10/27/23 2226 Code Status:   Code Status: Do not attempt resuscitation (DNR) PRE-ARREST INTERVENTIONS DESIRED Family Communication: None present  Status is: Inpatient  Dispo: The patient is from: Home              Anticipated d/c is to: Facility              Anticipated d/c date is: Imminent              Patient currently is medically stable for discharge  Consultants:  PCCM, palliative care, general surgery, ophthalmology  Antimicrobials:  None currently  Subjective: No acute issues or events reported overnight, review of systems otherwise quite limited by patient's mental status.  Objective: Vitals:   12/21/23 2206 12/21/23 2315 12/22/23 0148 12/22/23 0321  BP: (!) 128/104 (!) 125/98  (!) 125/103  Pulse: (!) 110 87 84 89  Resp:  15 17 18   Temp:  98.8 F (37.1 C)  98.8 F (37.1 C)  TempSrc:  Axillary  Axillary  SpO2:  97% 97% 92%  Weight:      Height:        Intake/Output Summary (Last 24 hours) at 12/22/2023 0746 Last data filed at 12/22/2023 0320 Gross per 24 hour  Intake 3095 ml  Output 1725 ml  Net 1370 ml   Filed Weights   12/13/23 0500 12/14/23 0500  Weight: 99.6 kg 99.8 kg    Examination:  General: Resting comfortably in bed, no acute distress, poorly interactive  Neuro: Flaccid paralysis diffusely - more lethargic today but arouses to voice and tracks motion vertically with R eye. No movement in L foot or  mouth today. HEENT: Left eye closed, right eye able to track motion vertically but not horizontally Neck: Tracheostomy noted. Lungs: Diminished bilaterally without overt wheezes rales or rhonchi. Heart:  Regular rate and rhythm.  Abdomen:  Soft, nondistended.  PEG noted clean dry intact  Data Reviewed: I have personally reviewed following labs and imaging studies  CBC: Recent Labs  Lab 12/18/23 0921  WBC 10.2  HGB 11.3*  HCT 35.9*  MCV 97.0  PLT 561*   Basic Metabolic Panel: Recent Labs  Lab 12/17/23 0920 12/18/23 0921 12/20/23 1216  NA 138 137 134*  K 3.8 3.8 4.2  CL 98 101 98  CO2 24 25 25   GLUCOSE 165* 128* 128*  BUN 26* 25* 26*  CREATININE 0.79 0.69 0.69  CALCIUM 9.4 9.2 9.3   GFR: Estimated Creatinine Clearance: 130.8 mL/min (by C-G formula based on SCr of 0.69 mg/dL).  CBG: Recent Labs  Lab 12/21/23 1144 12/21/23 1516 12/21/23 1922 12/21/23 2313 12/22/23 0321  GLUCAP 130* 114* 123* 165* 99   Radiology Studies: No results found.  Scheduled Meds:  amLODipine   10 mg Per Tube Daily   artificial tears   Left Eye Q8H   Chlorhexidine  Gluconate Cloth  6 each Topical Daily   enoxaparin  (LOVENOX ) injection  50 mg Subcutaneous Daily   famotidine   20 mg Per Tube BID   feeding supplement (PROSource TF20)  60 mL Per Tube TID   free water   200 mL Per Tube Q4H   insulin  aspart  0-15 Units Subcutaneous Q4H   metoprolol  tartrate  100 mg Per Tube BID   modafinil   100 mg Per Tube Daily   multivitamin with minerals  1 tablet Per Tube Daily   nutrition supplement (JUVEN)  1 packet Per Tube BID BM   mouth rinse  15 mL Mouth Rinse 4 times per day   Continuous Infusions:  feeding supplement (OSMOLITE 1.5 CAL) 50 mL/hr at 12/21/23 1800     LOS: 56 days   Time spent:  Haydee Lipa, DO [facility]  If 7PM-7AM, please contact night-coverage [email or web address]  12/22/2023, 7:46 AM

## 2023-12-23 DIAGNOSIS — I629 Nontraumatic intracranial hemorrhage, unspecified: Secondary | ICD-10-CM | POA: Diagnosis not present

## 2023-12-23 DIAGNOSIS — Z930 Tracheostomy status: Secondary | ICD-10-CM | POA: Diagnosis not present

## 2023-12-23 DIAGNOSIS — L8990 Pressure ulcer of unspecified site, unspecified stage: Secondary | ICD-10-CM | POA: Diagnosis not present

## 2023-12-23 DIAGNOSIS — Z9911 Dependence on respirator [ventilator] status: Secondary | ICD-10-CM | POA: Diagnosis not present

## 2023-12-23 DIAGNOSIS — I613 Nontraumatic intracerebral hemorrhage in brain stem: Secondary | ICD-10-CM | POA: Diagnosis not present

## 2023-12-23 DIAGNOSIS — Z7189 Other specified counseling: Secondary | ICD-10-CM | POA: Diagnosis not present

## 2023-12-23 LAB — GLUCOSE, CAPILLARY
Glucose-Capillary: 110 mg/dL — ABNORMAL HIGH (ref 70–99)
Glucose-Capillary: 132 mg/dL — ABNORMAL HIGH (ref 70–99)
Glucose-Capillary: 142 mg/dL — ABNORMAL HIGH (ref 70–99)
Glucose-Capillary: 117 mg/dL — ABNORMAL HIGH (ref 70–99)
Glucose-Capillary: 99 mg/dL (ref 70–99)
Glucose-Capillary: 104 mg/dL — ABNORMAL HIGH (ref 70–99)

## 2023-12-23 NOTE — Plan of Care (Signed)
 Problem: Education: Goal: Knowledge of disease or condition will improve 12/23/2023 0114 by Alejos Husband, RN Outcome: Progressing 12/22/2023 2316 by Alejos Husband, RN Outcome: Progressing Goal: Knowledge of secondary prevention will improve (MUST DOCUMENT ALL) 12/23/2023 0114 by Alejos Husband, RN Outcome: Progressing 12/22/2023 2316 by Alejos Husband, RN Outcome: Progressing Goal: Knowledge of patient specific risk factors will improve (DELETE if not current risk factor) 12/23/2023 0114 by Alejos Husband, RN Outcome: Progressing 12/22/2023 2316 by Alejos Husband, RN Outcome: Progressing   Problem: Intracerebral Hemorrhage Tissue Perfusion: Goal: Complications of Intracerebral Hemorrhage will be minimized 12/23/2023 0114 by Alejos Husband, RN Outcome: Progressing 12/22/2023 2316 by Alejos Husband, RN Outcome: Progressing   Problem: Coping: Goal: Will verbalize positive feelings about self 12/23/2023 0114 by Alejos Husband, RN Outcome: Progressing 12/22/2023 2316 by Alejos Husband, RN Outcome: Progressing Goal: Will identify appropriate support needs 12/23/2023 0114 by Alejos Husband, RN Outcome: Progressing 12/22/2023 2316 by Alejos Husband, RN Outcome: Progressing   Problem: Health Behavior/Discharge Planning: Goal: Ability to manage health-related needs will improve 12/23/2023 0114 by Alejos Husband, RN Outcome: Progressing 12/22/2023 2316 by Alejos Husband, RN Outcome: Progressing Goal: Goals will be collaboratively established with patient/family 12/23/2023 0114 by Alejos Husband, RN Outcome: Progressing 12/22/2023 2316 by Alejos Husband, RN Outcome: Progressing   Problem: Self-Care: Goal: Ability to participate in self-care as condition permits will improve 12/23/2023 0114 by Alejos Husband, RN Outcome: Progressing 12/22/2023 2316 by Alejos Husband, RN Outcome: Progressing Goal: Verbalization of feelings and concerns  over difficulty with self-care will improve 12/23/2023 0114 by Alejos Husband, RN Outcome: Progressing 12/22/2023 2316 by Alejos Husband, RN Outcome: Progressing Goal: Ability to communicate needs accurately will improve 12/23/2023 0114 by Alejos Husband, RN Outcome: Progressing 12/22/2023 2316 by Alejos Husband, RN Outcome: Progressing   Problem: Nutrition: Goal: Risk of aspiration will decrease 12/23/2023 0114 by Alejos Husband, RN Outcome: Progressing 12/22/2023 2316 by Alejos Husband, RN Outcome: Progressing Goal: Dietary intake will improve 12/23/2023 0114 by Alejos Husband, RN Outcome: Progressing 12/22/2023 2316 by Alejos Husband, RN Outcome: Progressing   Problem: Education: Goal: Ability to describe self-care measures that may prevent or decrease complications (Diabetes Survival Skills Education) will improve 12/23/2023 0114 by Alejos Husband, RN Outcome: Progressing 12/22/2023 2316 by Alejos Husband, RN Outcome: Progressing   Problem: Coping: Goal: Ability to adjust to condition or change in health will improve 12/23/2023 0114 by Alejos Husband, RN Outcome: Progressing 12/22/2023 2316 by Alejos Husband, RN Outcome: Progressing   Problem: Fluid Volume: Goal: Ability to maintain a balanced intake and output will improve 12/23/2023 0114 by Alejos Husband, RN Outcome: Progressing 12/22/2023 2316 by Alejos Husband, RN Outcome: Progressing   Problem: Health Behavior/Discharge Planning: Goal: Ability to identify and utilize available resources and services will improve 12/23/2023 0114 by Alejos Husband, RN Outcome: Progressing 12/22/2023 2316 by Alejos Husband, RN Outcome: Progressing Goal: Ability to manage health-related needs will improve 12/23/2023 0114 by Alejos Husband, RN Outcome: Progressing 12/22/2023 2316 by Alejos Husband, RN Outcome: Progressing   Problem: Metabolic: Goal: Ability to maintain appropriate  glucose levels will improve 12/23/2023 0114 by Alejos Husband, RN Outcome: Progressing 12/22/2023 2316 by Alejos Husband, RN Outcome: Progressing   Problem: Nutritional: Goal: Maintenance of adequate nutrition will improve 12/23/2023 0114 by Alejos Husband, RN Outcome: Progressing 12/22/2023 2316 by Darleene Ege  Dyanna Glasgow, RN Outcome: Progressing Goal: Progress toward achieving an optimal weight will improve 12/23/2023 0114 by Alejos Husband, RN Outcome: Progressing 12/22/2023 2316 by Alejos Husband, RN Outcome: Progressing   Problem: Skin Integrity: Goal: Risk for impaired skin integrity will decrease 12/23/2023 0114 by Alejos Husband, RN Outcome: Progressing 12/22/2023 2316 by Alejos Husband, RN Outcome: Progressing   Problem: Tissue Perfusion: Goal: Adequacy of tissue perfusion will improve 12/23/2023 0114 by Alejos Husband, RN Outcome: Progressing 12/22/2023 2316 by Alejos Husband, RN Outcome: Progressing   Problem: Education: Goal: Knowledge of General Education information will improve Description: Including pain rating scale, medication(s)/side effects and non-pharmacologic comfort measures 12/23/2023 0114 by Alejos Husband, RN Outcome: Progressing 12/22/2023 2316 by Alejos Husband, RN Outcome: Progressing   Problem: Health Behavior/Discharge Planning: Goal: Ability to manage health-related needs will improve 12/23/2023 0114 by Alejos Husband, RN Outcome: Progressing 12/22/2023 2316 by Alejos Husband, RN Outcome: Progressing   Problem: Clinical Measurements: Goal: Ability to maintain clinical measurements within normal limits will improve 12/23/2023 0114 by Alejos Husband, RN Outcome: Progressing 12/22/2023 2316 by Alejos Husband, RN Outcome: Progressing Goal: Will remain free from infection 12/23/2023 0114 by Alejos Husband, RN Outcome: Progressing 12/22/2023 2316 by Alejos Husband, RN Outcome: Progressing Goal: Diagnostic  test results will improve 12/23/2023 0114 by Alejos Husband, RN Outcome: Progressing 12/22/2023 2316 by Alejos Husband, RN Outcome: Progressing Goal: Respiratory complications will improve 12/23/2023 0114 by Alejos Husband, RN Outcome: Progressing 12/22/2023 2316 by Alejos Husband, RN Outcome: Progressing Goal: Cardiovascular complication will be avoided 12/23/2023 0114 by Alejos Husband, RN Outcome: Progressing 12/22/2023 2316 by Alejos Husband, RN Outcome: Progressing   Problem: Activity: Goal: Risk for activity intolerance will decrease 12/23/2023 0114 by Alejos Husband, RN Outcome: Progressing 12/22/2023 2316 by Alejos Husband, RN Outcome: Progressing   Problem: Nutrition: Goal: Adequate nutrition will be maintained 12/23/2023 0114 by Alejos Husband, RN Outcome: Progressing 12/22/2023 2316 by Alejos Husband, RN Outcome: Progressing   Problem: Coping: Goal: Level of anxiety will decrease 12/23/2023 0114 by Alejos Husband, RN Outcome: Progressing 12/22/2023 2316 by Alejos Husband, RN Outcome: Progressing   Problem: Elimination: Goal: Will not experience complications related to bowel motility 12/23/2023 0114 by Alejos Husband, RN Outcome: Progressing 12/22/2023 2316 by Alejos Husband, RN Outcome: Progressing Goal: Will not experience complications related to urinary retention 12/23/2023 0114 by Alejos Husband, RN Outcome: Progressing 12/22/2023 2316 by Alejos Husband, RN Outcome: Progressing   Problem: Pain Managment: Goal: General experience of comfort will improve and/or be controlled 12/23/2023 0114 by Alejos Husband, RN Outcome: Progressing 12/22/2023 2316 by Alejos Husband, RN Outcome: Progressing   Problem: Safety: Goal: Ability to remain free from injury will improve 12/23/2023 0114 by Alejos Husband, RN Outcome: Progressing 12/22/2023 2316 by Alejos Husband, RN Outcome: Progressing   Problem: Skin  Integrity: Goal: Risk for impaired skin integrity will decrease 12/23/2023 0114 by Alejos Husband, RN Outcome: Progressing 12/22/2023 2316 by Alejos Husband, RN Outcome: Progressing   Problem: Education: Goal: 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 12/23/2023 0114 by Alejos Husband, RN Outcome: Progressing 12/22/2023 2316 by Alejos Husband, RN Outcome: Progressing   Problem: Activity: Goal: Ability to tolerate increased activity will improve 12/23/2023 0114 by Alejos Husband, RN Outcome: Progressing 12/22/2023 2316 by Alejos Husband, RN Outcome: Progressing   Problem: Health Behavior/Discharge Planning:  Goal: Ability to manage tracheostomy will improve 12/23/2023 0114 by Alejos Husband, RN Outcome: Progressing 12/22/2023 2316 by Alejos Husband, RN Outcome: Progressing   Problem: Respiratory: Goal: Patent airway maintenance will improve 12/23/2023 0114 by Alejos Husband, RN Outcome: Progressing 12/22/2023 2316 by Alejos Husband, RN Outcome: Progressing   Problem: Role Relationship: Goal: Ability to communicate will improve 12/23/2023 0114 by Alejos Husband, RN Outcome: Progressing 12/22/2023 2316 by Alejos Husband, RN Outcome: Progressing

## 2023-12-23 NOTE — Progress Notes (Signed)
 PROGRESS NOTE    Garrett [name]  ZOX:[id number] DOB: 1975/10/27 DOA: 10/27/2023 PCP: Pcp, No   Brief Narrative:  Patient is a 48 year old male with no known medical history.  Patient found down on 10/27/2023 unresponsive with UDS positive for opiates, minimal responsiveness to Narcan .  Found to be markedly hypertensive at 260/156.  CT head at intake shows 4.1 cm intracranial hemorrhage at the pons with intraventricular extension into the fourth ventricle and basal cisterns.  Initially admitted by critical care, now extubated to tracheostomy which was placed 3/22.  PEG placed 3/24.  Patient continues to be medically stable for discharge at this time, he does carry high risk for complications in the setting of his need for total care.  Questionable aspiration pneumonia noted during hospitalization now status posttreatment.  Disposition remains difficult given patient's needs and family understands his poor prognosis, as such patient was made DNR per discussion with family as they would not want any further aggressive or heroic measures to be taken.  Patient remains medically stable for discharge patient's Medicaid should be updated in June/July which should assist with placement.    Major events: 3/8: Intubated in ED, pontine bleed/SAH. CT head 17:09 Acute large hemorrhage 4.1 cm in pons with intraventricular extension without hydrocephalus 3/9: CTA 03:14 AM No change in IPH in pons, similar biparietal [facility] 3/14: Now awake, appears locked in can follow commands with eyes. 3/16 Purposeful with left upper extremity  3/22 tracheostomy placed at bedside, bleeding issues overnight from trach site 3/24 PEG (Dr. Hildy Lowers) 3/24 sputum culture with MRSA 3/27 vomited. TF on hold. Abd film c/w ileus. Added reglan , got SSE. Did tolerate PSV all day  3/28 BMs x2 after SSE day prior. Added back TFs at 1/2 rated. Tolerated PS 3/29 Tolerated PS  3/31 Tolerating CPAP PS 15/5, TF on hold due to ileus. 4/1:  con't to hold tube feeds today, restarting vancomycin  and sending tracheal aspirate as peaks are 37, plat 24, driving 19. Thick secretions. Fever overnight.  4/2: peaks improved. Ongoing hiccups. Trickle feeding. Neuro exam unchanged.  4/20: trach was changed to cuffless #8 5/1: Mental status appears to be somewhat improving off narcotics, more awake alert following simple commands(able to grimace, move left toes, track vertically with right eye).  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 Catastrophic injury Repeat CT head 3/9,3/18 and 3/29 with decreased size Echo EF 60-65% A1c 4.7 LDL 96 Hold modafinil  Discontinue IV narcotics   Acute respiratory failure  In the setting of large pontine stroke/brainstem compression Unable to protect airway Trach placed 11/10/23 changed to cuffless 4/21 Pulmonology following - 5/1: Shiley cuffless #6 XLT proximal   Aspiration pneumonia, multifactorial, resolving Complicated by recently placed trach (11/10/23) Dysphagia in the setting of CVA and ongoing dysphagia  SLP continues to follow as above Completed vancomycin /linezolid  12/04/2023  Moderate to severe protein caloric malnutrition, POA Continue Osmolite, Prosource, free water , defer to dietary with witness Osmolite 50 cc/H,  Prosource 60cc 3 times daily Free water  management as  below   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 Recommendations from ophthalmology 4/7-(4/7): maxitrol ointment TID x1 week followed by Refresh PM TID-QID for chronic lubrication; can tape eyelid closed if exposure is still an issue   AKI earlier in hospital stay with acute urinary retention Foley replaced 4/13 will need to continue; prior removals/voiding trial unsuccessful Free water  decreased to 200 q4h given minimally downtrending sodium, continue to follow, appreciate dietary insight recommendations   Macrocytic anemia thrombocytosis Overall stable/resolved Perineal skin injury, POA - Continue Gerhard's Butt cream   DVT prophylaxis: SCDs Start: 10/27/23 2226 Code Status:   Code Status: Do not attempt resuscitation (DNR) PRE-ARREST INTERVENTIONS DESIRED Family Communication: None present  Status is: Inpatient  Dispo: The patient is from: Home              Anticipated d/c is to: Facility              Anticipated d/c date is: Imminent              Patient currently is medically stable for discharge  Consultants:  PCCM, palliative care, general surgery, ophthalmology  Antimicrobials:  None currently  Subjective: No acute issues or events reported overnight, review of systems otherwise quite limited by patient's mental status.  Objective: Vitals:   12/23/23 0227 12/23/23 0400 12/23/23 0500 12/23/23 0700  BP:    105/81  Pulse: 90     Resp: 16     Temp:  99.3 F (37.4 C)  97.8 F (36.6 C)  TempSrc:  Axillary  Axillary  SpO2: 98%     Weight:   (!) 143.3 kg   Height:        Intake/Output Summary (Last 24 hours) at 12/23/2023 0803 Last data filed at 12/23/2023 0406 Gross per 24 hour  Intake 450 ml  Output 2376 ml  Net -1926 ml   Filed Weights   12/22/23 0718 12/23/23 0500  Weight: (!) 143.3 kg (!) 143.3 kg    Examination:  General: Resting comfortably in bed, no acute distress, poorly interactive  Neuro: Flaccid paralysis diffusely - more lethargic today but arouses to voice and tracks motion vertically  with R eye. No movement in L foot or mouth today. HEENT: Left eye closed, right eye able to track motion vertically but not horizontally Neck: Tracheostomy noted. Lungs: Diminished bilaterally without overt wheezes rales or rhonchi. Heart:  Regular rate and rhythm.  Abdomen:  Soft, nondistended.  PEG noted clean dry intact  Data Reviewed: I have personally reviewed following labs and imaging studies  CBC: Recent Labs  Lab 12/18/23 0921  WBC 10.2  HGB 11.3*  HCT 35.9*  MCV 97.0  PLT 561*   Basic Metabolic Panel: Recent Labs  Lab 12/17/23 0920 12/18/23 0921 12/20/23 1216  NA 138 137 134*  K 3.8 3.8 4.2  CL 98 101 98  CO2 24 25 25   GLUCOSE 165* 128* 128*  BUN 26* 25* 26*  CREATININE 0.79 0.69 0.69  CALCIUM 9.4 9.2 9.3   GFR: Estimated Creatinine Clearance: 158.9 mL/min (by C-G formula based on SCr of 0.69 mg/dL).  CBG: Recent Labs  Lab 12/22/23 1106 12/22/23 1524 12/22/23 1920 12/22/23 2316 12/23/23 0408  GLUCAP 125* 109* 121* 132* 110*   Radiology Studies: No results found.  Scheduled Meds:  amLODipine   10 mg Per Tube Daily   artificial tears   Left Eye Q8H   Chlorhexidine  Gluconate Cloth  6 each Topical Daily   enoxaparin  (LOVENOX ) injection  50 mg Subcutaneous  Daily   famotidine   20 mg Per Tube BID   feeding supplement (PROSource TF20)  60 mL Per Tube TID   free water   200 mL Per Tube Q4H   insulin  aspart  0-15 Units Subcutaneous Q4H   metoprolol  tartrate  100 mg Per Tube BID   modafinil   100 mg Per Tube Daily   multivitamin with minerals  1 tablet Per Tube Daily   nutrition supplement (JUVEN)  1 packet Per Tube BID BM   mouth rinse  15 mL Mouth Rinse 4 times per day   Continuous Infusions:  feeding supplement (OSMOLITE 1.5 CAL) 1,000 mL (12/22/23 1622)     LOS: 57 days   Time spent:  Haydee Lipa, DO [facility]  If 7PM-7AM, please contact night-coverage [email or web address]  12/23/2023, 8:03 AM

## 2023-12-24 DIAGNOSIS — Z930 Tracheostomy status: Secondary | ICD-10-CM | POA: Diagnosis not present

## 2023-12-24 DIAGNOSIS — I629 Nontraumatic intracranial hemorrhage, unspecified: Secondary | ICD-10-CM | POA: Diagnosis not present

## 2023-12-24 DIAGNOSIS — L8990 Pressure ulcer of unspecified site, unspecified stage: Secondary | ICD-10-CM | POA: Diagnosis not present

## 2023-12-24 DIAGNOSIS — Z7189 Other specified counseling: Secondary | ICD-10-CM | POA: Diagnosis not present

## 2023-12-24 DIAGNOSIS — I613 Nontraumatic intracerebral hemorrhage in brain stem: Secondary | ICD-10-CM | POA: Diagnosis not present

## 2023-12-24 LAB — GLUCOSE, CAPILLARY
Glucose-Capillary: 140 mg/dL — ABNORMAL HIGH (ref 70–99)
Glucose-Capillary: 129 mg/dL — ABNORMAL HIGH (ref 70–99)
Glucose-Capillary: 114 mg/dL — ABNORMAL HIGH (ref 70–99)
Glucose-Capillary: 130 mg/dL — ABNORMAL HIGH (ref 70–99)
Glucose-Capillary: 150 mg/dL — ABNORMAL HIGH (ref 70–99)
Glucose-Capillary: 114 mg/dL — ABNORMAL HIGH (ref 70–99)

## 2023-12-24 NOTE — Progress Notes (Signed)
 NAME:  Garrett [name], MRN:  [id number], DOB:  Feb 25, 1976, LOS: 58 ADMISSION DATE:  10/27/2023, CONSULTATION DATE:  10/27/2023 REFERRING MD:  Florentino Hurdle, DO CHIEF COMPLAINT:  Found Down  History of Present Illness:  48 y/o male with past medical history of hypertension who was found down for unknown downtime by his fiance when she came home from work. Reportedly, having agonal breathing.  He apparently had driven her to work this morning.  He has HTN but never checks his BP and does not follow with MD.  She says you can tell when his BP is really high; his eyes get blood shot and he is sweating.  No h/o seizures.  Besides almost daily [name] and cigarette smoking, she is not aware of any other drugs. Drug screen positive for opioids and he was given several doses of Narcan . Patient found to have:  1. 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 Basal cisterns and fourth ventricle are effaced without hydrocephalus at this time. 2. Trace additional subarachnoid hemorrhage along the left parietal convexity. BP in ED 262/156. His CXR revealed a RUL and perihilar infiltrates suggesting aspiration pneumonia. ED labs also revealed PH 7.169, LA 4.4, CPK 985. Patient was intubated in ED.  Pertinent  Medical History  hypertension  Significant Hospital Events: Including procedures, antibiotic start and stop dates in addition to other pertinent events   3/8: Intubated in ED, pontine bleed/SAH. CT head 17:09 Acute large hemorrhage 4.1 cm in pons with intraventricular extension without hydrocephalus 3/9: CTA 03:14 AM No change in IPH in pons, similar biparietal [facility] 3/14: Now awake, appears locked in can follow commands with eyes. 3/16 Purposeful with left upper extremity  3/22 tracheostomy placed at bedside, bleeding issues overnight from trach site 3/24 PEG (Dr. Hildy Lowers) 3/24  sputum culture with MRSA 3/27 vomited. TF on hold. Abd film c/w ileus. Added reglan , got SSE. Did tolerate PSV all day  3/28 BMs x2 after SSE day prior. Added back TFs at 1/2 rated. Tolerated PS 3/29 Tolerated PS  3/31 Tolerating CPAP PS 15/5, TF on hold due to ileus. 4/1: con't to hold tube feeds today, restarting vancomycin  and sending tracheal aspirate as peaks are 37, plat 24, driving 19. Thick secretions. Fever overnight.  4/2: peaks improved. Ongoing hiccups. Trickle feeding. Neuro exam unchanged.  4/20: trach was changed to cuffless #8 4/28: not safe to swallow. Limited communication and he follows simple commands. On TF 5/1: on TC 28%, with large secretions. Working with speech and he is improving to initiate PMV trials under ST supervision   5/5: on TC 30%, 7 L/min. Trach #6 cuffless, changed 5/1 to facilitate PMV trials   Interim History / Subjective:   Has been doing well on trach collar  Objective   Blood pressure (!) 139/105, pulse 81, temperature 99 F (37.2 C), temperature source Oral, resp. rate 16, height 5\' 8"  (1.727 m), weight (!) 143.3 kg, SpO2 99%.    FiO2 (%):  [21 %] 21 %   Intake/Output Summary (Last 24 hours) at 12/24/2023 1403 Last data filed at 12/24/2023 1100 Gross per 24 hour  Intake 7381.67 ml  Output 2100 ml  Net 5281.67 ml   Filed Weights   12/22/23 0718 12/23/23 0500 12/24/23 0443  Weight: (!) 143.3 kg (!) 143.3 kg (!) 143.3 kg   Physical Exam: General: middle aged male, lying in bed, no distress minimally responsive. On TC  30% HEENT: Tracheostomy in place. Minimal secretions. Chest: Clear bilaterally CV: HS normal  Abdomen: PEG in place.  Extremities: No edema. Neuro: eyes open, tracks. Trying to squeeze hands on commands.   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   - PMV and swallow  eval per ST.  - Continue Shiley cuffless #6 XLT proximal  - Continue trach collar - PRN duonebs and suctionioning   Madelynn Schilder, M.D. [facility] Pulmonary/Critical Care Medicine  See Amion for personal pager PCCM on call pager [phone] until 7pm. Please call Elink 7p-7a. [phone]

## 2023-12-24 NOTE — NC FL2 (Signed)
 [place]  MEDICAID [id number] LEVEL OF CARE FORM     IDENTIFICATION  Patient Name: Garrett [name] Birthdate: 12-25-1975 Sex: male Admission Date (Current Location): 10/27/2023  [facility] and [place] Number:  Producer, television/film/video and Address:  [facility] [place], [address] [place], [name] [address]      Provider Number: [id number]  Attending Physician Name and Address:  Haydee Lipa, MD  Relative Name and Phone Number:       Current Level of Care: Hospital Recommended Level of Care: Vent SNF Prior Approval Number:    Date Approved/Denied:   PASRR Number: [id number]  Discharge Plan: SNF    Current Diagnoses: Patient Active Problem List   Diagnosis Date Noted   On mechanically assisted ventilation (HCC) 11/22/2023   Advanced care planning/counseling discussion 11/22/2023   Tracheostomy dependence (HCC) 11/22/2023   Pressure injury of skin 11/22/2023   ICH (intracerebral hemorrhage) (HCC) 10/27/2023   Intracranial hemorrhage (HCC) 10/27/2023    Orientation RESPIRATION BLADDER Height & Weight      (unable to follow commands intermittenly)  Vent, Tracheostomy (Vent 40% w/ shiley XLT proximal cuffed 8mm trach) Continent, Indwelling catheter Weight: (!) 315 lb 15.9 oz (143.3 kg) Height:  5\' 8"  (172.7 cm)  BEHAVIORAL SYMPTOMS/MOOD NEUROLOGICAL BOWEL NUTRITION STATUS      Incontinent Feeding tube  AMBULATORY STATUS COMMUNICATION OF NEEDS Skin   Total Care Verbally PU Stage and Appropriate Care (Stage II on perineum)                       Personal Care Assistance Level of Assistance  Bathing, Feeding, Dressing Bathing Assistance: Maximum assistance Feeding assistance: Maximum assistance Dressing Assistance: Maximum assistance     Functional Limitations Info  Sight, Speech Sight Info: Impaired   Speech Info: Impaired    SPECIAL CARE FACTORS FREQUENCY  PT (By licensed PT), OT (By licensed OT), Speech therapy     PT Frequency:  Eval and treat OT Frequency: Eval and treat     Speech Therapy Frequency: Eval and treat      Contractures Contractures Info: Not present    Additional Factors Info  Suctioning Needs Code Status Info: DNR Allergies Info: Tomato   Insulin  Sliding Scale Info: see dc summary Isolation Precautions Info: MRSA Suctioning Needs: 1x-2x a day PRN   Current Medications (12/24/2023):  This is the current hospital active medication list Current Facility-Administered Medications  Medication Dose Route Frequency Provider Last Rate Last Admin   acetaminophen  (TYLENOL ) tablet 650 mg  650 mg Per Tube Q4H PRN Lindzen, Eric, MD   650 mg at 12/05/23 2043   Or   acetaminophen  (TYLENOL ) 160 MG/5ML solution 650 mg  650 mg Per Tube Q4H PRN Lindzen, Eric, MD   650 mg at 12/24/23 3474   Or   acetaminophen  (TYLENOL ) suppository 650 mg  650 mg Rectal Q4H PRN Kimberley Penman, MD       amLODipine  (NORVASC ) tablet 10 mg  10 mg Per Tube Daily Imogene Mana, NP   10 mg at 12/24/23 0827   artificial tears (LACRILUBE) ophthalmic ointment   Left Eye Q8H [name], MD   1 Application at 12/24/23 2595   bisacodyl  (DULCOLAX) suppository 10 mg  10 mg Rectal Daily PRN Sethi, Pramod S, MD       Chlorhexidine  Gluconate Cloth 2 % PADS 6 each  6 each Topical Daily Khaliqdina, Salman, MD   6 each at 12/24/23 0827   enoxaparin  (LOVENOX )  injection 50 mg  50 mg Subcutaneous Daily Desai, Rahul P, PA-C   50 mg at 12/24/23 0827   famotidine  (PEPCID ) tablet 20 mg  20 mg Per Tube BID Sethi, Pramod S, MD   20 mg at 12/24/23 0981   feeding supplement (OSMOLITE 1.5 CAL) liquid 1,000 mL  1,000 mL Per Tube Continuous Smith, Daniel C, MD 50 mL/hr at 12/23/23 1546 1,000 mL at 12/23/23 1546   feeding supplement (PROSource TF20) liquid 60 mL  60 mL Per Tube TID Lind Repine, MD   60 mL at 12/24/23 0827   free water  200 mL  200 mL Per Tube Q4H Haydee Lipa, MD   200 mL at 12/24/23 1251   Gerhardt's butt cream   Topical PRN Oral Billings, MD   Given at 12/11/23 1449   insulin  aspart (novoLOG ) injection 0-15 Units  0-15 Units Subcutaneous Q4H Claven Cumming, MD   2 Units at 12/24/23 0849   ipratropium-albuterol  (DUONEB) 0.5-2.5 (3) MG/3ML nebulizer solution 3 mL  3 mL Nebulization Q4H PRN Claven Cumming, MD   3 mL at 12/13/23 1739   labetalol  (NORMODYNE ) injection 20 mg  20 mg Intravenous Q2H PRN Sethi, Pramod S, MD   20 mg at 12/14/23 1241   metoprolol  tartrate (LOPRESSOR ) tablet 100 mg  100 mg Per Tube BID Agarwala, Ravi, MD   100 mg at 12/24/23 0827   multivitamin with minerals tablet 1 tablet  1 tablet Per Tube Daily Joesph Mussel, DO   1 tablet at 12/24/23 0827   nutrition supplement (JUVEN) (JUVEN) powder packet 1 packet  1 packet Per Tube BID BM Oral Billings, MD   1 packet at 12/24/23 0827   Oral care mouth rinse  15 mL Mouth Rinse 4 times per day Oral Billings, MD   15 mL at 12/24/23 1251   Oral care mouth rinse  15 mL Mouth Rinse PRN Oral Billings, MD       polyethylene glycol (MIRALAX  / GLYCOLAX ) packet 17 g  17 g Per Tube Daily PRN Agarwala, Ravi, MD   17 g at 12/22/23 1914   polyvinyl alcohol  (LIQUIFILM TEARS) 1.4 % ophthalmic solution 1 drop  1 drop Both Eyes PRN Quillian Brunt, MD       prochlorperazine  (COMPAZINE ) injection 10 mg  10 mg Intravenous Q4H PRN Autry, Lauren E, PA-C   10 mg at 11/27/23 1800     Discharge Medications: Please see discharge summary for a list of discharge medications.  Relevant Imaging Results:  Relevant Lab Results:   Additional Information SSN: [id number]  Joydan Gretzinger E Jazlynn Nemetz, LCSW

## 2023-12-24 NOTE — Progress Notes (Signed)
 Physical Therapy Treatment Patient Details Name: Garrett [name] MRN: [id number] DOB: [birth year] Today's Date: 12/24/2023   History of Present Illness Pt is a 48 yo male who was found down 10/27/23 with agonal breathing. 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 Intubated 3/8, cortrak placed 3/14, trach placed 3/22, PEG placed 3/24. PMH: HTN, smoker    PT Comments  The pt was received in bed, awake and alert to voice. He was able to follow various commands this session including movement of L toes, L fingers, and head position. He continues to require totalA to complete rolling for lift pad placement and maximove to recliner to allow for safe upright positioning for respiratory system. VSS throughout, will continue to benefit from skilled PT to progress command following and activity tolerance.     If plan is discharge home, recommend the following: Two people to help with walking and/or transfers;Two people to help with bathing/dressing/bathroom;Assistance with cooking/housework;Assistance with feeding;Direct supervision/assist for medications management;Direct supervision/assist for financial management;Assist for transportation;Help with stairs or ramp for entrance;Supervision due to cognitive status   Can travel by private vehicle        Equipment Recommendations  [place] lift;Hospital bed;Wheelchair (measurements PT);Wheelchair cushion (measurements PT);BSC/3in1;Other (comment) (air mattress)    Recommendations for Other Services       Precautions / Restrictions Precautions Precautions: Fall;Other (comment) Recall of Precautions/Restrictions: Impaired Precaution/Restrictions Comments: trach, PEG, abdominal binder, SBP < 160 Restrictions Weight Bearing Restrictions Per Provider Order: No     Mobility  Bed  Mobility Overal bed mobility: Needs Assistance Bed Mobility: Rolling Rolling: Total assist         General bed mobility comments: TotalA for rolling to R/L for placement of maxi sky lift pad    Transfers Overall transfer level: Needs assistance Equipment used: Ambulation equipment used Transfers: Bed to chair/wheelchair/BSC             General transfer comment: Maximove bed to chair Transfer via Lift Equipment: Maximove     Balance Overall balance assessment: Needs assistance Sitting-balance support: No upper extremity supported, Feet supported Sitting balance-Leahy Scale: Zero Sitting balance - Comments: dependent on trunk and head suppor                                    Communication Communication Communication: Impaired Factors Affecting Communication: Trach/intubated  Cognition Arousal: Alert Behavior During Therapy: Flat affect   PT - Cognitive impairments: Difficult to assess Difficult to assess due to: Tracheostomy, Impaired communication                     PT - Cognition Comments: Opens eye to name call, able to wiggle toes on L foot and followed x10 for ankle pumps, turning head x5, did not attempt to vocalize Following commands: Impaired Following commands impaired: Follows one step commands inconsistently, Follows one step commands with increased time (for head/neck movement, L toes, and L hand only)    Cueing Cueing Techniques: Verbal cues, Tactile cues, Visual cues, Gestural cues  Exercises General Exercises - Lower Extremity Ankle Circles/Pumps: 10 reps, Supine, AAROM, Left, PROM, Right Long Arc Quad: PROM, Both, 5 reps, Seated Heel Slides: PROM, Both, 10 reps, Seated Hip ABduction/ADduction: PROM, Both, 10 reps, Supine    General Comments General comments (skin integrity,  edema, etc.): VSS      Pertinent Vitals/Pain Pain Assessment Pain Assessment: No/denies pain Faces Pain Scale: No hurt Pain Intervention(s):  Monitored during session, Repositioned     PT Goals (current goals can now be found in the care plan section) Acute Rehab PT Goals Patient Stated Goal: unable to state PT Goal Formulation: With family Potential to Achieve Goals: Fair Progress towards PT goals: Progressing toward goals    Frequency    Min 2X/week       AM-PAC PT "6 Clicks" Mobility   Outcome Measure  Help needed turning from your back to your side while in a flat bed without using bedrails?: Total Help needed moving from lying on your back to sitting on the side of a flat bed without using bedrails?: Total Help needed moving to and from a bed to a chair (including a wheelchair)?: Total Help needed standing up from a chair using your arms (e.g., wheelchair or bedside chair)?: Total Help needed to walk in hospital room?: Total Help needed climbing 3-5 steps with a railing? : Total 6 Click Score: 6    End of Session Equipment Utilized During Treatment: Oxygen Activity Tolerance: Patient tolerated treatment well Patient left: in chair;with call bell/phone within reach;with chair alarm set Nurse Communication: Mobility status;Need for lift equipment PT Visit Diagnosis: Muscle weakness (generalized) (M62.81);Difficulty in walking, not elsewhere classified (R26.2);Other symptoms and signs involving the nervous system (R29.898);Hemiplegia and hemiparesis Hemiplegia - Right/Left: Right Hemiplegia - dominant/non-dominant: Dominant Hemiplegia - caused by: Nontraumatic intracerebral hemorrhage     Time: 1216-1249 PT Time Calculation (min) (ACUTE ONLY): 33 min  Charges:    $Therapeutic Exercise: 8-22 mins $Therapeutic Activity: 8-22 mins PT General Charges $$ ACUTE PT VISIT: 1 Visit                     Barnabas Booth, PT, DPT   Acute Rehabilitation Department Office [phone] Secure Chat Communication Preferred    Lona Rist 12/24/2023, 4:01 PM

## 2023-12-24 NOTE — Plan of Care (Signed)
 Problem: Education: Goal: Knowledge of disease or condition will improve 12/24/2023 2331 by Alejos Husband, RN Outcome: Progressing 12/24/2023 2047 by Alejos Husband, RN Outcome: Progressing Goal: Knowledge of secondary prevention will improve (MUST DOCUMENT ALL) 12/24/2023 2331 by Alejos Husband, RN Outcome: Progressing 12/24/2023 2047 by Alejos Husband, RN Outcome: Progressing Goal: Knowledge of patient specific risk factors will improve (DELETE if not current risk factor) 12/24/2023 2331 by Alejos Husband, RN Outcome: Progressing 12/24/2023 2047 by Alejos Husband, RN Outcome: Progressing   Problem: Intracerebral Hemorrhage Tissue Perfusion: Goal: Complications of Intracerebral Hemorrhage will be minimized 12/24/2023 2331 by Alejos Husband, RN Outcome: Progressing 12/24/2023 2047 by Alejos Husband, RN Outcome: Progressing   Problem: Coping: Goal: Will verbalize positive feelings about self 12/24/2023 2331 by Alejos Husband, RN Outcome: Progressing 12/24/2023 2047 by Alejos Husband, RN Outcome: Progressing Goal: Will identify appropriate support needs 12/24/2023 2331 by Alejos Husband, RN Outcome: Progressing 12/24/2023 2047 by Alejos Husband, RN Outcome: Progressing   Problem: Health Behavior/Discharge Planning: Goal: Ability to manage health-related needs will improve 12/24/2023 2331 by Alejos Husband, RN Outcome: Progressing 12/24/2023 2047 by Alejos Husband, RN Outcome: Progressing Goal: Goals will be collaboratively established with patient/family 12/24/2023 2331 by Alejos Husband, RN Outcome: Progressing 12/24/2023 2047 by Alejos Husband, RN Outcome: Progressing   Problem: Self-Care: Goal: Ability to participate in self-care as condition permits will improve 12/24/2023 2331 by Alejos Husband, RN Outcome: Progressing 12/24/2023 2047 by Alejos Husband, RN Outcome: Progressing Goal: Verbalization of feelings and concerns  over difficulty with self-care will improve 12/24/2023 2331 by Alejos Husband, RN Outcome: Progressing 12/24/2023 2047 by Alejos Husband, RN Outcome: Progressing Goal: Ability to communicate needs accurately will improve 12/24/2023 2331 by Alejos Husband, RN Outcome: Progressing 12/24/2023 2047 by Alejos Husband, RN Outcome: Progressing   Problem: Nutrition: Goal: Risk of aspiration will decrease 12/24/2023 2331 by Alejos Husband, RN Outcome: Progressing 12/24/2023 2047 by Alejos Husband, RN Outcome: Progressing Goal: Dietary intake will improve 12/24/2023 2331 by Alejos Husband, RN Outcome: Progressing 12/24/2023 2047 by Alejos Husband, RN Outcome: Progressing   Problem: Education: Goal: Ability to describe self-care measures that may prevent or decrease complications (Diabetes Survival Skills Education) will improve 12/24/2023 2331 by Alejos Husband, RN Outcome: Progressing 12/24/2023 2047 by Alejos Husband, RN Outcome: Progressing   Problem: Coping: Goal: Ability to adjust to condition or change in health will improve 12/24/2023 2331 by Alejos Husband, RN Outcome: Progressing 12/24/2023 2047 by Alejos Husband, RN Outcome: Progressing   Problem: Fluid Volume: Goal: Ability to maintain a balanced intake and output will improve 12/24/2023 2331 by Alejos Husband, RN Outcome: Progressing 12/24/2023 2047 by Alejos Husband, RN Outcome: Progressing   Problem: Health Behavior/Discharge Planning: Goal: Ability to identify and utilize available resources and services will improve 12/24/2023 2331 by Alejos Husband, RN Outcome: Progressing 12/24/2023 2047 by Alejos Husband, RN Outcome: Progressing Goal: Ability to manage health-related needs will improve 12/24/2023 2331 by Alejos Husband, RN Outcome: Progressing 12/24/2023 2047 by Alejos Husband, RN Outcome: Progressing   Problem: Metabolic: Goal: Ability to maintain appropriate  glucose levels will improve 12/24/2023 2331 by Alejos Husband, RN Outcome: Progressing 12/24/2023 2047 by Alejos Husband, RN Outcome: Progressing   Problem: Nutritional: Goal: Maintenance of adequate nutrition will improve 12/24/2023 2331 by Alejos Husband, RN Outcome: Progressing 12/24/2023 2047 by Darleene Ege,  Dyanna Glasgow, RN Outcome: Progressing Goal: Progress toward achieving an optimal weight will improve 12/24/2023 2331 by Alejos Husband, RN Outcome: Progressing 12/24/2023 2047 by Alejos Husband, RN Outcome: Progressing   Problem: Skin Integrity: Goal: Risk for impaired skin integrity will decrease 12/24/2023 2331 by Alejos Husband, RN Outcome: Progressing 12/24/2023 2047 by Alejos Husband, RN Outcome: Progressing   Problem: Tissue Perfusion: Goal: Adequacy of tissue perfusion will improve 12/24/2023 2331 by Alejos Husband, RN Outcome: Progressing 12/24/2023 2047 by Alejos Husband, RN Outcome: Progressing   Problem: Education: Goal: Knowledge of General Education information will improve Description: Including pain rating scale, medication(s)/side effects and non-pharmacologic comfort measures 12/24/2023 2331 by Alejos Husband, RN Outcome: Progressing 12/24/2023 2047 by Alejos Husband, RN Outcome: Progressing   Problem: Health Behavior/Discharge Planning: Goal: Ability to manage health-related needs will improve 12/24/2023 2331 by Alejos Husband, RN Outcome: Progressing 12/24/2023 2047 by Alejos Husband, RN Outcome: Progressing   Problem: Clinical Measurements: Goal: Ability to maintain clinical measurements within normal limits will improve 12/24/2023 2331 by Alejos Husband, RN Outcome: Progressing 12/24/2023 2047 by Alejos Husband, RN Outcome: Progressing Goal: Will remain free from infection 12/24/2023 2331 by Alejos Husband, RN Outcome: Progressing 12/24/2023 2047 by Alejos Husband, RN Outcome: Progressing Goal: Diagnostic  test results will improve 12/24/2023 2331 by Alejos Husband, RN Outcome: Progressing 12/24/2023 2047 by Alejos Husband, RN Outcome: Progressing Goal: Respiratory complications will improve 12/24/2023 2331 by Alejos Husband, RN Outcome: Progressing 12/24/2023 2047 by Alejos Husband, RN Outcome: Progressing Goal: Cardiovascular complication will be avoided 12/24/2023 2331 by Alejos Husband, RN Outcome: Progressing 12/24/2023 2047 by Alejos Husband, RN Outcome: Progressing   Problem: Activity: Goal: Risk for activity intolerance will decrease 12/24/2023 2331 by Alejos Husband, RN Outcome: Progressing 12/24/2023 2047 by Alejos Husband, RN Outcome: Progressing   Problem: Nutrition: Goal: Adequate nutrition will be maintained 12/24/2023 2331 by Alejos Husband, RN Outcome: Progressing 12/24/2023 2047 by Alejos Husband, RN Outcome: Progressing   Problem: Coping: Goal: Level of anxiety will decrease 12/24/2023 2331 by Alejos Husband, RN Outcome: Progressing 12/24/2023 2047 by Alejos Husband, RN Outcome: Progressing   Problem: Elimination: Goal: Will not experience complications related to bowel motility 12/24/2023 2331 by Alejos Husband, RN Outcome: Progressing 12/24/2023 2047 by Alejos Husband, RN Outcome: Progressing Goal: Will not experience complications related to urinary retention 12/24/2023 2331 by Alejos Husband, RN Outcome: Progressing 12/24/2023 2047 by Alejos Husband, RN Outcome: Progressing   Problem: Pain Managment: Goal: General experience of comfort will improve and/or be controlled 12/24/2023 2331 by Alejos Husband, RN Outcome: Progressing 12/24/2023 2047 by Alejos Husband, RN Outcome: Progressing   Problem: Safety: Goal: Ability to remain free from injury will improve 12/24/2023 2331 by Alejos Husband, RN Outcome: Progressing 12/24/2023 2047 by Alejos Husband, RN Outcome: Progressing   Problem: Skin  Integrity: Goal: Risk for impaired skin integrity will decrease 12/24/2023 2331 by Alejos Husband, RN Outcome: Progressing 12/24/2023 2047 by Alejos Husband, RN Outcome: Progressing   Problem: Education: Goal: 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 12/24/2023 2331 by Alejos Husband, RN Outcome: Progressing 12/24/2023 2047 by Alejos Husband, RN Outcome: Progressing   Problem: Activity: Goal: Ability to tolerate increased activity will improve 12/24/2023 2331 by Alejos Husband, RN Outcome: Progressing 12/24/2023 2047 by Alejos Husband, RN Outcome: Progressing   Problem: Health Behavior/Discharge Planning:  Goal: Ability to manage tracheostomy will improve 12/24/2023 2331 by Alejos Husband, RN Outcome: Progressing 12/24/2023 2047 by Alejos Husband, RN Outcome: Progressing   Problem: Respiratory: Goal: Patent airway maintenance will improve 12/24/2023 2331 by Alejos Husband, RN Outcome: Progressing 12/24/2023 2047 by Alejos Husband, RN Outcome: Progressing   Problem: Role Relationship: Goal: Ability to communicate will improve 12/24/2023 2331 by Alejos Husband, RN Outcome: Progressing 12/24/2023 2047 by Alejos Husband, RN Outcome: Progressing

## 2023-12-24 NOTE — Progress Notes (Addendum)
 PROGRESS NOTE    Garrett [name]  ZOX:[id number] DOB: [birth year] DOA: 10/27/2023 PCP: Pcp, No   Brief Narrative:  Patient is a 48 year old male with no known medical history.  Patient found down on 10/27/2023 unresponsive with UDS positive for opiates, minimal responsiveness to Narcan .  Found to be markedly hypertensive at 260/156.  CT head at intake shows 4.1 cm intracranial hemorrhage at the pons with intraventricular extension into the fourth ventricle and basal cisterns.  Initially admitted by critical care, now extubated to tracheostomy which was placed 3/22.  PEG placed 3/24.  Patient continues to be medically stable for discharge at this time, he does carry high risk for complications in the setting of his need for total care.  Questionable aspiration pneumonia noted during hospitalization now status posttreatment.  Disposition remains difficult given patient's needs and family understands his poor prognosis, as such patient was made DNR per discussion with family as they would not want any further aggressive or heroic measures to be taken.  Patient remains medically stable for discharge patient's disability should be updated in June/July which should assist with placement.    Major events: 3/8: Intubated in ED, pontine bleed/SAH. CT head 17:09 Acute large hemorrhage 4.1 cm in pons with intraventricular extension without hydrocephalus 3/9: CTA 03:14 AM No change in IPH in pons, similar biparietal [facility] 3/14: Now awake, appears locked in can follow commands with eyes. 3/16 Purposeful with left upper extremity  3/22 tracheostomy placed at bedside, bleeding issues overnight from trach site 3/24 PEG (Dr. Hildy Lowers) 3/24 sputum culture with MRSA 3/27 vomited. TF on hold. Abd film c/w ileus. Added reglan , got SSE. Did tolerate PSV all day  3/28 BMs x2 after SSE day prior. Added back TFs at 1/2 rated. Tolerated PS 3/29 Tolerated PS  3/31 Tolerating CPAP PS 15/5, TF on hold due to ileus. 4/1:  con't to hold tube feeds today, restarting vancomycin  and sending tracheal aspirate as peaks are 37, plat 24, driving 19. Thick secretions. Fever overnight.  4/2: peaks improved. Ongoing hiccups. Trickle feeding. Neuro exam unchanged.  4/20: trach was changed to cuffless #8 5/1: Mental status appears to be somewhat improving off narcotics, more awake alert following simple commands(able to grimace, move left toes, track vertically with right eye).  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 Catastrophic injury Repeat CT head 3/9,3/18 and 3/29 with decreased size Echo EF 60-65% A1c 4.7 LDL 96 Hold modafinil  Discontinue IV narcotics   Acute respiratory failure  In the setting of large pontine stroke/brainstem compression Unable to protect airway Trach placed 11/10/23 changed to cuffless 4/21 Pulmonology following - 5/1: Shiley cuffless #6 XLT proximal   Aspiration pneumonia, multifactorial, resolving Complicated by recently placed trach (11/10/23) Dysphagia in the setting of CVA and ongoing dysphagia  SLP continues to follow as above Completed vancomycin /linezolid  12/04/2023  Moderate to severe protein caloric malnutrition, POA Continue Osmolite, Prosource, free water , defer to dietary with witness Osmolite 50 cc/H,  Prosource 60cc 3 times daily Free water  management as  below   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 Recommendations from ophthalmology 4/7-(4/7): maxitrol ointment TID x1 week followed by Refresh PM TID-QID for chronic lubrication; can tape eyelid closed if exposure is still an issue   AKI earlier in hospital stay with acute urinary retention Foley replaced 4/13 will need to continue; prior removals/voiding trial unsuccessful Free water  decreased to 200 q4h given minimally downtrending sodium, continue to follow, appreciate dietary insight recommendations   Macrocytic anemia thrombocytosis Overall stable/resolved Perineal skin injury, POA - Continue Gerhard's Butt cream   DVT prophylaxis: SCDs Start: 10/27/23 2226 Code Status:   Code Status: Do not attempt resuscitation (DNR) PRE-ARREST INTERVENTIONS DESIRED Family Communication: None present  Status is: Inpatient  Dispo: The patient is from: Home              Anticipated d/c is to: Facility              Anticipated d/c date is: Imminent              Patient currently is medically stable for discharge  Consultants:  PCCM, palliative care, general surgery, ophthalmology  Antimicrobials:  None currently  Subjective: No acute issues or events reported overnight, review of systems otherwise quite limited by patient's mental status.  Objective: Vitals:   12/23/23 2330 12/24/23 0310 12/24/23 0400 12/24/23 0443  BP:      Pulse: 79     Resp: 20     Temp:   98.7 F (37.1 C)   TempSrc:   Oral   SpO2:  97%    Weight:    (!) 143.3 kg  Height:        Intake/Output Summary (Last 24 hours) at 12/24/2023 0719 Last data filed at 12/23/2023 2315 Gross per 24 hour  Intake 7381.67 ml  Output 1800 ml  Net 5581.67 ml   Filed Weights   12/22/23 0718 12/23/23 0500 12/24/23 0443  Weight: (!) 143.3 kg (!) 143.3 kg (!) 143.3 kg    Examination:  General: Resting comfortably in bed, no acute distress, poorly interactive  Neuro: Flaccid paralysis diffusely - more lethargic today but arouses to voice and tracks motion vertically with  R eye. No movement in L foot or mouth today. HEENT: Left eye closed, right eye able to track motion vertically but not horizontally Neck: Tracheostomy noted. Lungs: Diminished bilaterally without overt wheezes rales or rhonchi. Heart:  Regular rate and rhythm.  Abdomen:  Soft, nondistended.  PEG noted clean dry intact  Data Reviewed: I have personally reviewed following labs and imaging studies  CBC: Recent Labs  Lab 12/18/23 0921  WBC 10.2  HGB 11.3*  HCT 35.9*  MCV 97.0  PLT 561*   Basic Metabolic Panel: Recent Labs  Lab 12/17/23 0920 12/18/23 0921 12/20/23 1216  NA 138 137 134*  K 3.8 3.8 4.2  CL 98 101 98  CO2 24 25 25   GLUCOSE 165* 128* 128*  BUN 26* 25* 26*  CREATININE 0.79 0.69 0.69  CALCIUM 9.4 9.2 9.3   GFR: Estimated Creatinine Clearance: 158.9 mL/min (by C-G formula based on SCr of 0.69 mg/dL).  CBG: Recent Labs  Lab 12/23/23 1140 12/23/23 1552 12/23/23 2000 12/23/23 2312 12/24/23 0421  GLUCAP 142* 117* 99 104* 140*   Radiology Studies: No results found.  Scheduled Meds:  amLODipine   10 mg Per Tube Daily   artificial tears   Left Eye Q8H   Chlorhexidine  Gluconate Cloth  6 each Topical Daily   enoxaparin  (LOVENOX ) injection  50  mg Subcutaneous Daily   famotidine   20 mg Per Tube BID   feeding supplement (PROSource TF20)  60 mL Per Tube TID   free water   200 mL Per Tube Q4H   insulin  aspart  0-15 Units Subcutaneous Q4H   metoprolol  tartrate  100 mg Per Tube BID   multivitamin with minerals  1 tablet Per Tube Daily   nutrition supplement (JUVEN)  1 packet Per Tube BID BM   mouth rinse  15 mL Mouth Rinse 4 times per day   Continuous Infusions:  feeding supplement (OSMOLITE 1.5 CAL) 1,000 mL (12/23/23 1546)     LOS: 58 days   Time spent:  Haydee Lipa, DO [facility]  If 7PM-7AM, please contact night-coverage [email or web address]  12/24/2023, 7:19 AM

## 2023-12-24 NOTE — TOC Progression Note (Signed)
 Transition of Care [facility]) - Progression Note    Patient Details  Name: Garrett [name] MRN: [id number] Date of Birth: 1975/12/22  [facility] CM/SW Contact  Juliane Och, LCSW Phone Number: 12/24/2023, 2:38 PM  Clinical Narrative:     2:38 PM Per bedside RN, patient needs suctioning 1x-2x a day PRN. Bedside RN has yet to suction patient today but did once yesterday. CSW updated fl2 and resent to SNFs.  Expected Discharge Plan: Long Term Nursing Home Barriers to Discharge: Continued Medical Work up, SNF Pending Medicaid, SNF Pending bed offer, SNF Pending payor source - LOG, Inadequate or no insurance (New trach)  Expected Discharge Plan and Services In-house Referral: Clinical Social Work, Hospice / Palliative Care   Post Acute Care Choice: Skilled Nursing Facility Living arrangements for the past 2 months: Single Family Home                                       Social Determinants of Health (SDOH) Interventions SDOH Screenings   Food Insecurity: Patient Unable To Answer (10/29/2023)  Social Connections: Unknown (06/23/2023)   Received from [facility]  Tobacco Use: High Risk (10/31/2023)    Readmission Risk Interventions     No data to display

## 2023-12-24 NOTE — Plan of Care (Signed)
 Problem: Education: Goal: Knowledge of disease or condition will improve 12/24/2023 0515 by Alejos Husband, RN Outcome: Progressing 12/23/2023 2059 by Alejos Husband, RN Outcome: Not Progressing Goal: Knowledge of secondary prevention will improve (MUST DOCUMENT ALL) 12/24/2023 0515 by Alejos Husband, RN Outcome: Progressing 12/23/2023 2059 by Alejos Husband, RN Outcome: Not Progressing Goal: Knowledge of patient specific risk factors will improve (DELETE if not current risk factor) 12/24/2023 0515 by Alejos Husband, RN Outcome: Progressing 12/23/2023 2059 by Alejos Husband, RN Outcome: Not Progressing   Problem: Intracerebral Hemorrhage Tissue Perfusion: Goal: Complications of Intracerebral Hemorrhage will be minimized 12/24/2023 0515 by Alejos Husband, RN Outcome: Progressing 12/23/2023 2059 by Alejos Husband, RN Outcome: Not Progressing   Problem: Coping: Goal: Will verbalize positive feelings about self 12/24/2023 0515 by Alejos Husband, RN Outcome: Progressing 12/23/2023 2059 by Alejos Husband, RN Outcome: Not Progressing Goal: Will identify appropriate support needs 12/24/2023 0515 by Alejos Husband, RN Outcome: Progressing 12/23/2023 2059 by Alejos Husband, RN Outcome: Not Progressing   Problem: Health Behavior/Discharge Planning: Goal: Ability to manage health-related needs will improve 12/24/2023 0515 by Alejos Husband, RN Outcome: Progressing 12/23/2023 2059 by Alejos Husband, RN Outcome: Not Progressing Goal: Goals will be collaboratively established with patient/family 12/24/2023 0515 by Alejos Husband, RN Outcome: Progressing 12/23/2023 2059 by Alejos Husband, RN Outcome: Not Progressing   Problem: Self-Care: Goal: Ability to participate in self-care as condition permits will improve 12/24/2023 0515 by Alejos Husband, RN Outcome: Progressing 12/23/2023 2059 by Alejos Husband, RN Outcome: Not Progressing Goal:  Verbalization of feelings and concerns over difficulty with self-care will improve 12/24/2023 0515 by Alejos Husband, RN Outcome: Progressing 12/23/2023 2059 by Alejos Husband, RN Outcome: Not Progressing Goal: Ability to communicate needs accurately will improve 12/24/2023 0515 by Alejos Husband, RN Outcome: Progressing 12/23/2023 2059 by Alejos Husband, RN Outcome: Not Progressing   Problem: Nutrition: Goal: Risk of aspiration will decrease 12/24/2023 0515 by Alejos Husband, RN Outcome: Progressing 12/23/2023 2059 by Alejos Husband, RN Outcome: Not Progressing Goal: Dietary intake will improve 12/24/2023 0515 by Alejos Husband, RN Outcome: Progressing 12/23/2023 2059 by Alejos Husband, RN Outcome: Not Progressing   Problem: Education: Goal: Ability to describe self-care measures that may prevent or decrease complications (Diabetes Survival Skills Education) will improve 12/24/2023 0515 by Alejos Husband, RN Outcome: Progressing 12/23/2023 2059 by Alejos Husband, RN Outcome: Not Progressing   Problem: Coping: Goal: Ability to adjust to condition or change in health will improve 12/24/2023 0515 by Alejos Husband, RN Outcome: Progressing 12/23/2023 2059 by Alejos Husband, RN Outcome: Not Progressing   Problem: Fluid Volume: Goal: Ability to maintain a balanced intake and output will improve 12/24/2023 0515 by Alejos Husband, RN Outcome: Progressing 12/23/2023 2059 by Alejos Husband, RN Outcome: Not Progressing   Problem: Health Behavior/Discharge Planning: Goal: Ability to identify and utilize available resources and services will improve 12/24/2023 0515 by Alejos Husband, RN Outcome: Progressing 12/23/2023 2059 by Alejos Husband, RN Outcome: Not Progressing Goal: Ability to manage health-related needs will improve 12/24/2023 0515 by Alejos Husband, RN Outcome: Progressing 12/23/2023 2059 by Alejos Husband, RN Outcome: Not  Progressing   Problem: Metabolic: Goal: Ability to maintain appropriate glucose levels will improve 12/24/2023 0515 by Alejos Husband, RN Outcome: Progressing 12/23/2023 2059 by Alejos Husband, RN Outcome: Not Progressing   Problem: Nutritional: Goal:  Maintenance of adequate nutrition will improve 12/24/2023 0515 by Alejos Husband, RN Outcome: Progressing 12/23/2023 2059 by Alejos Husband, RN Outcome: Not Progressing Goal: Progress toward achieving an optimal weight will improve 12/24/2023 0515 by Alejos Husband, RN Outcome: Progressing 12/23/2023 2059 by Alejos Husband, RN Outcome: Not Progressing   Problem: Skin Integrity: Goal: Risk for impaired skin integrity will decrease 12/24/2023 0515 by Alejos Husband, RN Outcome: Progressing 12/23/2023 2059 by Alejos Husband, RN Outcome: Not Progressing   Problem: Tissue Perfusion: Goal: Adequacy of tissue perfusion will improve 12/24/2023 0515 by Alejos Husband, RN Outcome: Progressing 12/23/2023 2059 by Alejos Husband, RN Outcome: Not Progressing   Problem: Education: Goal: Knowledge of General Education information will improve Description: Including pain rating scale, medication(s)/side effects and non-pharmacologic comfort measures 12/24/2023 0515 by Alejos Husband, RN Outcome: Progressing 12/23/2023 2059 by Alejos Husband, RN Outcome: Not Progressing   Problem: Health Behavior/Discharge Planning: Goal: Ability to manage health-related needs will improve 12/24/2023 0515 by Alejos Husband, RN Outcome: Progressing 12/23/2023 2059 by Alejos Husband, RN Outcome: Not Progressing   Problem: Clinical Measurements: Goal: Ability to maintain clinical measurements within normal limits will improve 12/24/2023 0515 by Alejos Husband, RN Outcome: Progressing 12/23/2023 2059 by Alejos Husband, RN Outcome: Not Progressing Goal: Will remain free from infection 12/24/2023 0515 by Alejos Husband,  RN Outcome: Progressing 12/23/2023 2059 by Alejos Husband, RN Outcome: Not Progressing Goal: Diagnostic test results will improve 12/24/2023 0515 by Alejos Husband, RN Outcome: Progressing 12/23/2023 2059 by Alejos Husband, RN Outcome: Not Progressing Goal: Respiratory complications will improve 12/24/2023 0515 by Alejos Husband, RN Outcome: Progressing 12/23/2023 2059 by Alejos Husband, RN Outcome: Not Progressing Goal: Cardiovascular complication will be avoided 12/24/2023 0515 by Alejos Husband, RN Outcome: Progressing 12/23/2023 2059 by Alejos Husband, RN Outcome: Not Progressing   Problem: Activity: Goal: Risk for activity intolerance will decrease 12/24/2023 0515 by Alejos Husband, RN Outcome: Progressing 12/23/2023 2059 by Alejos Husband, RN Outcome: Not Progressing   Problem: Nutrition: Goal: Adequate nutrition will be maintained 12/24/2023 0515 by Alejos Husband, RN Outcome: Progressing 12/23/2023 2059 by Alejos Husband, RN Outcome: Not Progressing   Problem: Coping: Goal: Level of anxiety will decrease 12/24/2023 0515 by Alejos Husband, RN Outcome: Progressing 12/23/2023 2059 by Alejos Husband, RN Outcome: Not Progressing   Problem: Elimination: Goal: Will not experience complications related to bowel motility 12/24/2023 0515 by Alejos Husband, RN Outcome: Progressing 12/23/2023 2059 by Alejos Husband, RN Outcome: Not Progressing Goal: Will not experience complications related to urinary retention 12/24/2023 0515 by Alejos Husband, RN Outcome: Progressing 12/23/2023 2059 by Alejos Husband, RN Outcome: Not Progressing   Problem: Pain Managment: Goal: General experience of comfort will improve and/or be controlled 12/24/2023 0515 by Alejos Husband, RN Outcome: Progressing 12/23/2023 2059 by Alejos Husband, RN Outcome: Not Progressing   Problem: Safety: Goal: Ability to remain free from injury will  improve 12/24/2023 0515 by Alejos Husband, RN Outcome: Progressing 12/23/2023 2059 by Alejos Husband, RN Outcome: Not Progressing   Problem: Skin Integrity: Goal: Risk for impaired skin integrity will decrease 12/24/2023 0515 by Alejos Husband, RN Outcome: Progressing 12/23/2023 2059 by Alejos Husband, RN Outcome: Not Progressing   Problem: Education: Goal: 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 12/24/2023 0515 by Alejos Husband, RN Outcome: Progressing 12/23/2023 2059 by Alejos Husband, RN  Outcome: Not Progressing   Problem: Activity: Goal: Ability to tolerate increased activity will improve 12/24/2023 0515 by Alejos Husband, RN Outcome: Progressing 12/23/2023 2059 by Alejos Husband, RN Outcome: Not Progressing   Problem: Health Behavior/Discharge Planning: Goal: Ability to manage tracheostomy will improve 12/24/2023 0515 by Alejos Husband, RN Outcome: Progressing 12/23/2023 2059 by Alejos Husband, RN Outcome: Not Progressing   Problem: Respiratory: Goal: Patent airway maintenance will improve 12/24/2023 0515 by Alejos Husband, RN Outcome: Progressing 12/23/2023 2059 by Alejos Husband, RN Outcome: Not Progressing   Problem: Role Relationship: Goal: Ability to communicate will improve 12/24/2023 0515 by Alejos Husband, RN Outcome: Progressing 12/23/2023 2059 by Alejos Husband, RN Outcome: Not Progressing

## 2023-12-25 DIAGNOSIS — I613 Nontraumatic intracerebral hemorrhage in brain stem: Secondary | ICD-10-CM | POA: Diagnosis not present

## 2023-12-25 LAB — GLUCOSE, CAPILLARY
Glucose-Capillary: 97 mg/dL (ref 70–99)
Glucose-Capillary: 140 mg/dL — ABNORMAL HIGH (ref 70–99)
Glucose-Capillary: 161 mg/dL — ABNORMAL HIGH (ref 70–99)
Glucose-Capillary: 109 mg/dL — ABNORMAL HIGH (ref 70–99)
Glucose-Capillary: 113 mg/dL — ABNORMAL HIGH (ref 70–99)
Glucose-Capillary: 119 mg/dL — ABNORMAL HIGH (ref 70–99)

## 2023-12-25 NOTE — TOC Progression Note (Signed)
 Transition of Care [facility]) - Progression Note    Patient Details  Name: Garrett [name] MRN: [id number] Date of Birth: 21-Feb-1976  [facility] CM/SW Contact  Valery Gaucher, [name] Phone Number: 12/25/2023, 9:55 AM  Clinical Narrative:     Patient has no bed offers   TOC will continue to follow and assist with discharge planning.  Liddie Reel, MSW, LCSW Clinical Social Worker    Expected Discharge Plan: Long Term Nursing Home Barriers to Discharge: Continued Medical Work up, SNF Pending Medicaid, SNF Pending bed offer, SNF Pending payor source - LOG, Inadequate or no insurance (New trach)  Expected Discharge Plan and Services In-house Referral: Clinical Social Work, Hospice / Palliative Care   Post Acute Care Choice: Skilled Nursing Facility Living arrangements for the past 2 months: Single Family Home                                       Social Determinants of Health (SDOH) Interventions SDOH Screenings   Food Insecurity: Patient Unable To Answer (10/29/2023)  Social Connections: Unknown (06/23/2023)   Received from [facility]  Tobacco Use: High Risk (10/31/2023)    Readmission Risk Interventions     No data to display

## 2023-12-25 NOTE — Progress Notes (Signed)
 PROGRESS NOTE    Garrett [name]  ZOX:[id number] DOB: June 11, 1976 DOA: 10/27/2023 PCP: Pcp, No   Brief Narrative:  Patient is a 48 year old male with no known medical history.  Patient found down on 10/27/2023 unresponsive with UDS positive for opiates, minimal responsiveness to Narcan .  Found to be markedly hypertensive at 260/156.  CT head at intake shows 4.1 cm intracranial hemorrhage at the pons with intraventricular extension into the fourth ventricle and basal cisterns.  Initially admitted by critical care, now extubated to tracheostomy which was placed 3/22.  PEG placed 3/24.  Patient continues to be medically stable for discharge at this time, he does carry high risk for complications in the setting of his need for total care.  Questionable aspiration pneumonia noted during hospitalization now status posttreatment.  Disposition remains difficult given patient's needs and family understands his poor prognosis, as such patient was made DNR per discussion with family as they would not want any further aggressive or heroic measures to be taken.  Patient remains medically stable for discharge patient's disability should be updated in June/July which should assist with placement.    Major events: 3/8: Intubated in ED, pontine bleed/SAH. CT head 17:09 Acute large hemorrhage 4.1 cm in pons with intraventricular extension without hydrocephalus 3/9: CTA 03:14 AM No change in IPH in pons, similar biparietal [facility] 3/14: Now awake, appears locked in can follow commands with eyes. 3/16 Purposeful with left upper extremity  3/22 tracheostomy placed at bedside, bleeding issues overnight from trach site 3/24 PEG (Dr. Hildy Lowers) 3/24 sputum culture with MRSA 3/27 vomited. TF on hold. Abd film c/w ileus. Added reglan , got SSE. Did tolerate PSV all day  3/28 BMs x2 after SSE day prior. Added back TFs at 1/2 rated. Tolerated PS 3/29 Tolerated PS  3/31 Tolerating CPAP PS 15/5, TF on hold due to ileus. 4/1:  con't to hold tube feeds today, restarting vancomycin  and sending tracheal aspirate as peaks are 37, plat 24, driving 19. Thick secretions. Fever overnight.  4/2: peaks improved. Ongoing hiccups. Trickle feeding. Neuro exam unchanged.  4/20: trach was changed to cuffless #8 5/1: Mental status appears to be somewhat improving off narcotics, more awake alert following simple commands(able to grimace, move left toes, track vertically with right eye).  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 Catastrophic injury Repeat CT head 3/9,3/18 and 3/29 with decreased size Echo EF 60-65% A1c 4.7 LDL 96 Hold modafinil  Discontinue IV narcotics   Acute respiratory failure  In the setting of large pontine stroke/brainstem compression Unable to protect airway Trach placed 11/10/23 changed to cuffless 4/21 Pulmonology following - 5/1: Shiley cuffless #6 XLT proximal   Aspiration pneumonia, multifactorial, resolving Complicated by recently placed trach (11/10/23) Dysphagia in the setting of CVA and ongoing dysphagia  SLP continues to follow as above Completed vancomycin /linezolid  12/04/2023  Moderate to severe protein caloric malnutrition, POA Continue Osmolite, Prosource, free water , defer to dietary with witness Osmolite 50 cc/H,  Prosource 60cc 3 times daily Free water  management  as below   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 Recommendations from ophthalmology 4/7-(4/7): maxitrol ointment TID x1 week followed by Refresh PM TID-QID for chronic lubrication; can tape eyelid closed if exposure is still an issue   AKI earlier in hospital stay with acute urinary retention Foley replaced 4/13 will need to continue; prior removals/voiding trial unsuccessful Free water  decreased to 200 q4h given minimally downtrending sodium, continue to follow, appreciate dietary insight recommendations   Macrocytic anemia thrombocytosis Overall stable/resolved Perineal skin injury, POA - Continue Gerhard's Butt cream   DVT prophylaxis: SCDs Start: 10/27/23 2226 Code Status:   Code Status: Do not attempt resuscitation (DNR) PRE-ARREST INTERVENTIONS DESIRED Family Communication: None present  Status is: Inpatient  Dispo: The patient is from: Home              Anticipated d/c is to: Facility              Anticipated d/c date is: Imminent              Patient currently is medically stable for discharge  Consultants:  PCCM, palliative care, general surgery, ophthalmology  Antimicrobials:  None currently  Subjective: No acute issues or events reported overnight, review of systems otherwise quite limited by patient's mental status.  Objective: Vitals:   12/24/23 2330 12/25/23 0203 12/25/23 0331 12/25/23 0434  BP: 125/89  (!) 159/102   Pulse: 79  94   Resp: 14 12 (!) 21   Temp: 98.8 F (37.1 C)  98.4 F (36.9 C)   TempSrc: Axillary  Axillary   SpO2: 98%  99%   Weight:    96.6 kg  Height:        Intake/Output Summary (Last 24 hours) at 12/25/2023 0717 Last data filed at 12/24/2023 2000 Gross per 24 hour  Intake --  Output 2150 ml  Net -2150 ml   Filed Weights   12/24/23 1900 12/24/23 1949 12/25/23 0434  Weight: 68.3 kg 96.6 kg 96.6 kg    Examination:  General: Resting comfortably in bed, no acute distress, able to follow commands in regards to eye vertical movement and L great toe movement. Neuro: Flaccid  paralysis diffusely - more lethargic today but arouses to voice and tracks motion vertically with R eye.  HEENT: Left eye closed, right eye able to track motion vertically Neck: Tracheostomy noted. Lungs: Diminished bilaterally without overt wheezes rales or rhonchi. Heart:  Regular rate and rhythm.  Abdomen:  Soft, nondistended.  PEG noted clean dry intact  Data Reviewed: I have personally reviewed following labs and imaging studies  CBC: Recent Labs  Lab 12/18/23 0921  WBC 10.2  HGB 11.3*  HCT 35.9*  MCV 97.0  PLT 561*   Basic Metabolic Panel: Recent Labs  Lab 12/18/23 0921 12/20/23 1216  NA 137 134*  K 3.8 4.2  CL 101 98  CO2 25 25  GLUCOSE 128* 128*  BUN 25* 26*  CREATININE 0.69 0.69  CALCIUM 9.2 9.3   GFR: Estimated Creatinine Clearance: 128.7 mL/min (by C-G formula based on SCr of 0.69 mg/dL).  CBG: Recent Labs  Lab 12/24/23 1129 12/24/23 1615 12/24/23 1933 12/24/23 2326 12/25/23 0333  GLUCAP 114* 130* 150* 114* 97   Radiology Studies: No results found.  Scheduled Meds:  amLODipine   10 mg Per Tube Daily   artificial tears   Left Eye Q8H   Chlorhexidine  Gluconate Cloth  6 each Topical Daily   enoxaparin  (LOVENOX ) injection  50 mg Subcutaneous Daily   famotidine   20 mg Per Tube BID   feeding supplement (PROSource TF20)  60 mL Per Tube TID   free water   200 mL Per Tube Q4H   insulin  aspart  0-15 Units Subcutaneous Q4H   metoprolol  tartrate  100 mg Per Tube BID   multivitamin with minerals  1 tablet Per Tube Daily   nutrition supplement (JUVEN)  1 packet Per Tube BID BM   mouth rinse  15 mL Mouth Rinse 4 times per day   Continuous Infusions:  feeding supplement (OSMOLITE 1.5 CAL) 1,000 mL (12/24/23 1630)     LOS: 59 days   Time spent:  Haydee Lipa, DO [facility]  If 7PM-7AM, please contact night-coverage [email or web address]  12/25/2023, 7:17 AM

## 2023-12-25 NOTE — Progress Notes (Signed)
 Speech Language Pathology Treatment: [name] Speaking valve;Cognitive-Linquistic  Patient Details Name: Garrett [name] MRN: [id number] DOB: [birth year] Today's Date: 12/25/2023 Time: 4098-1191 SLP Time Calculation (min) (ACUTE ONLY): 10 min  Assessment / Plan / Recommendation Clinical Impression  Pt wore his PMV for 10+ minutes without overt signs of intolerance, including no back pressure and no significant changes in VS. He produced some spontaneous, glottic sounds (grunting, throat clearing), but made no verbalizations. He did not mimic sounds at the phoneme level. SLP also attempted to look at vertical eye gaze more with use of binary choices (one sign "yes" and one sign "no"). He had some inconsistency in his responses today, sometimes hard to differentiate when he was intentionally moving his focus up or down. He seemed to have the most consistent responses with SLP holding paper above and below my face, having him try to make eye contact with me in between questions. Accuracy still was not at as high as it had been during initial eval, but improved from previous session. Will continue to follow to try to facilitate conversation as much as possible.    HPI HPI: Pt is a 48 yo male who was found down 10/27/23 with agonal breathing. 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 Intubated 3/8, cortrak placed 3/14, trach placed 3/22, PEG placed 3/24. PMH: HTN, smoker      SLP Plan  Continue with current plan of care      Recommendations for follow up therapy are one component of a multi-disciplinary discharge planning process, led by the attending physician.  Recommendations may be updated based on patient status, additional functional criteria and insurance authorization.    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 CCC-SLP Acute Rehabilitation Services Office: [phone]  Secure chat preferred   12/25/2023, 2:44 PM

## 2023-12-25 NOTE — Evaluation (Signed)
 Clinical/Bedside Swallow Evaluation Patient Details  Name: Garrett [name] MRN: [id number] Date of Birth: 03/14/1976  Today's Date: 12/25/2023 Time: SLP Start Time (ACUTE ONLY): 1422 SLP Stop Time (ACUTE ONLY): 1435 SLP Time Calculation (min) (ACUTE ONLY): 13 min  Past Medical History: History reviewed. No pertinent past medical history. Past Surgical History: History reviewed. No pertinent surgical history. HPI:  Pt is a 48 yo male who was found down 10/27/23 with agonal breathing. 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 Intubated 3/8, cortrak placed 3/14, trach placed 3/22, PEG placed 3/24. PMH: HTN, smoker    Assessment / Plan / Recommendation  Clinical Impression  Pt is alert today but not following as many commands during initial speech/language eval with previous SLP. He opens his eyes but does not open his mouth. Attempted oral motor exam and presentation of ice chips to lips. He did not follow commands or model for oral motor exam and there was no initiation of acceptance of ice chips. Given location of hemorrhagic stroke, impact on swallowing is likely to be significant, so POs were not given when he was not actively accepting them. SLP will continue to follow for ongoing assessment of abilities. SLP Visit Diagnosis: Dysphagia, unspecified (R13.10)    Aspiration Risk       Diet Recommendation NPO;Alternative means - long-term    Medication Administration: Via alternative means    Other  Recommendations Oral Care Recommendations: Oral care QID Caregiver Recommendations: Have oral suction available    Recommendations for follow up therapy are one component of a multi-disciplinary discharge planning process, led by the attending physician.  Recommendations may be updated based on patient status, additional  functional criteria and insurance authorization.  Follow up Recommendations SLP at Long-term acute care hospital      Assistance Recommended at Discharge Frequent or constant Supervision/Assistance  Functional Status Assessment Patient has had a recent decline in their functional status and demonstrates the ability to make significant improvements in function in a reasonable and predictable amount of time.  Frequency and Duration min 2x/week  2 weeks       Prognosis Prognosis for improved oropharyngeal function: Fair Barriers to Reach Goals: Severity of deficits      Swallow Study   General HPI: Pt is a 48 yo male who was found down 10/27/23 with agonal breathing. 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 Intubated 3/8, cortrak placed 3/14, trach placed 3/22, PEG placed 3/24. PMH: HTN, smoker Type of Study: Bedside Swallow Evaluation Previous Swallow Assessment: none in chart Diet Prior to this Study: NPO;G-tube Temperature Spikes Noted: No Respiratory Status: Trach;Trach Collar Trach Size and Type: Uncuffed;#6;With PMSV in place History of Recent Intubation: Yes Total duration of intubation (days): 14 days Date extubated:  (trach 3/22) Behavior/Cognition: Alert Self-Feeding Abilities: Total assist Patient Positioning: Upright in bed Baseline Vocal Quality: Hoarse;Low vocal intensity (heard only during limited, spontaneous vocalizations) Volitional Cough: Cognitively unable to elicit Volitional Swallow: Unable to elicit    Oral/Motor/Sensory Function Overall Oral Motor/Sensory Function:  (not following commands today for assessment, but can refer to initial SLP speech/language eval for more details)   Ice Chips Ice chips: Impaired Presentation: Spoon Oral Phase Impairments: Other (comment) (no oral  acceptance) Pharyngeal Phase  Impairments: Unable to trigger swallow   Thin Liquid Thin Liquid: Not tested    Nectar Thick Nectar Thick Liquid: Not tested   Honey Thick Honey Thick Liquid: Not tested   Puree Puree: Not tested   Solid     Solid: Not tested      Beth Brooke., M.A. CCC-SLP Acute Rehabilitation Services Office: [phone]  Secure chat preferred  12/25/2023,2:57 PM

## 2023-12-25 NOTE — Plan of Care (Signed)
  Problem: Education: Goal: Knowledge of disease or condition will improve Outcome: Progressing Goal: Knowledge of secondary prevention will improve (MUST DOCUMENT ALL) Outcome: Progressing Goal: Knowledge of patient specific risk factors will improve (DELETE if not current risk factor) Outcome: Progressing   Problem: Intracerebral Hemorrhage Tissue Perfusion: Goal: Complications of Intracerebral Hemorrhage will be minimized Outcome: Progressing   Problem: Coping: Goal: Will verbalize positive feelings about self Outcome: Progressing Goal: Will identify appropriate support needs Outcome: Progressing   Problem: Health Behavior/Discharge Planning: Goal: Ability to manage health-related needs will improve Outcome: Progressing Goal: Goals will be collaboratively established with patient/family Outcome: Progressing   Problem: Self-Care: Goal: Ability to participate in self-care as condition permits will improve Outcome: Progressing Goal: Verbalization of feelings and concerns over difficulty with self-care will improve Outcome: Progressing Goal: Ability to communicate needs accurately will improve Outcome: Progressing

## 2023-12-25 NOTE — Progress Notes (Signed)
 Patient's metallic colored ring on his left pinky finger is very tight and causing skin breakdown on his finger. RN removed ring and placed in a bag with his name label at 0830am.    1615pm: Ring given to patient's significant other and updated her the reason for removal as it is causing skin breakdown on patient's pinky finger. Significant other [name] took the ring with her.

## 2023-12-26 DIAGNOSIS — I613 Nontraumatic intracerebral hemorrhage in brain stem: Secondary | ICD-10-CM | POA: Diagnosis not present

## 2023-12-26 LAB — GLUCOSE, CAPILLARY
Glucose-Capillary: 131 mg/dL — ABNORMAL HIGH (ref 70–99)
Glucose-Capillary: 133 mg/dL — ABNORMAL HIGH (ref 70–99)
Glucose-Capillary: 119 mg/dL — ABNORMAL HIGH (ref 70–99)
Glucose-Capillary: 114 mg/dL — ABNORMAL HIGH (ref 70–99)
Glucose-Capillary: 136 mg/dL — ABNORMAL HIGH (ref 70–99)

## 2023-12-26 NOTE — Plan of Care (Signed)
  Problem: Education: Goal: Knowledge of disease or condition will improve Outcome: Progressing Goal: Knowledge of secondary prevention will improve (MUST DOCUMENT ALL) Outcome: Progressing Goal: Knowledge of patient specific risk factors will improve (DELETE if not current risk factor) Outcome: Progressing   Problem: Intracerebral Hemorrhage Tissue Perfusion: Goal: Complications of Intracerebral Hemorrhage will be minimized Outcome: Progressing   Problem: Coping: Goal: Will verbalize positive feelings about self Outcome: Progressing Goal: Will identify appropriate support needs Outcome: Progressing   Problem: Health Behavior/Discharge Planning: Goal: Ability to manage health-related needs will improve Outcome: Progressing Goal: Goals will be collaboratively established with patient/family Outcome: Progressing   Problem: Self-Care: Goal: Ability to participate in self-care as condition permits will improve Outcome: Progressing Goal: Verbalization of feelings and concerns over difficulty with self-care will improve Outcome: Progressing Goal: Ability to communicate needs accurately will improve Outcome: Progressing   Problem: Nutrition: Goal: Risk of aspiration will decrease Outcome: Progressing Goal: Dietary intake will improve Outcome: Progressing   Problem: Education: Goal: Ability to describe self-care measures that may prevent or decrease complications (Diabetes Survival Skills Education) will improve Outcome: Progressing   Problem: Coping: Goal: Ability to adjust to condition or change in health will improve Outcome: Progressing   Problem: Fluid Volume: Goal: Ability to maintain a balanced intake and output will improve Outcome: Progressing   Problem: Health Behavior/Discharge Planning: Goal: Ability to identify and utilize available resources and services will improve Outcome: Progressing Goal: Ability to manage health-related needs will improve Outcome:  Progressing   Problem: Metabolic: Goal: Ability to maintain appropriate glucose levels will improve Outcome: Progressing   Problem: Nutritional: Goal: Maintenance of adequate nutrition will improve Outcome: Progressing Goal: Progress toward achieving an optimal weight will improve Outcome: Progressing   Problem: Skin Integrity: Goal: Risk for impaired skin integrity will decrease Outcome: Progressing   Problem: Tissue Perfusion: Goal: Adequacy of tissue perfusion will improve Outcome: Progressing   Problem: Education: Goal: Knowledge of General Education information will improve Description: Including pain rating scale, medication(s)/side effects and non-pharmacologic comfort measures Outcome: Progressing   Problem: Health Behavior/Discharge Planning: Goal: Ability to manage health-related needs will improve Outcome: Progressing   Problem: Clinical Measurements: Goal: Ability to maintain clinical measurements within normal limits will improve Outcome: Progressing Goal: Will remain free from infection Outcome: Progressing Goal: Diagnostic test results will improve Outcome: Progressing Goal: Respiratory complications will improve Outcome: Progressing Goal: Cardiovascular complication will be avoided Outcome: Progressing   Problem: Activity: Goal: Risk for activity intolerance will decrease Outcome: Progressing   Problem: Nutrition: Goal: Adequate nutrition will be maintained Outcome: Progressing   Problem: Coping: Goal: Level of anxiety will decrease Outcome: Progressing   Problem: Elimination: Goal: Will not experience complications related to bowel motility Outcome: Progressing Goal: Will not experience complications related to urinary retention Outcome: Progressing   Problem: Pain Managment: Goal: General experience of comfort will improve and/or be controlled Outcome: Progressing   Problem: Safety: Goal: Ability to remain free from injury will  improve Outcome: Progressing   Problem: Skin Integrity: Goal: Risk for impaired skin integrity will decrease Outcome: Progressing   Problem: Education: Goal: 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 Outcome: Progressing   Problem: Activity: Goal: Ability to tolerate increased activity will improve Outcome: Progressing   Problem: Health Behavior/Discharge Planning: Goal: Ability to manage tracheostomy will improve Outcome: Progressing   Problem: Respiratory: Goal: Patent airway maintenance will improve Outcome: Progressing   Problem: Role Relationship: Goal: Ability to communicate will improve Outcome: Progressing

## 2023-12-26 NOTE — Progress Notes (Signed)
 Occupational Therapy Treatment Patient Details Name: Garrett [name] MRN: [id number] DOB: 30-Sep-1975 Today's Date: 12/26/2023   History of present illness Pt is a 48 yo male who was found down 10/27/23 with agonal breathing. 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 Intubated 3/8, cortrak placed 3/14, trach placed 3/22, PEG placed 3/24. PMH: HTN, smoker   OT comment New yes/ no sign in room to help with communication during therapy sessions and keep visual aide consistent throughout providers. Pt demonstrates inconsistency in L hand activation and question need to further assess palmar reflex response. Pt did release object with L hand during session. Goals updated this session. Recommendation for skilled inpatient follow up therapy, <3 hours/day With trach care.       If plan is discharge home, recommend the following:  Other (comment)   Equipment Recommendations  Wheelchair (measurements OT);Wheelchair cushion (measurements OT);Hospital bed;Hoyer lift    Recommendations for Other Services Speech consult    Precautions / Restrictions Precautions Precautions: Fall;Other (comment) Recall of Precautions/Restrictions: Impaired Precaution/Restrictions Comments: trach, PEG, abdominal binder, SBP < 160 Required Braces or Orthoses: Other Brace Splint/Cast: Bil resting hand splints Splint/Cast - Date Prophylactic Dressing Applied (if applicable): 11/02/23 Other Brace: has bilateral prevalon boots Restrictions Weight Bearing Restrictions Per Provider Order: No       Mobility Bed Mobility                    Transfers                         Balance                                           ADL either performed or assessed with clinical judgement   ADL Overall ADL's : Needs  assistance/impaired                                       General ADL Comments: pt asked to sqeeze hand and demonstrates squeeze with Ot touching MCP palm. pt asked to repeat the task without response when touching the dip / tip of finger. pt asked to release object with return demonstration x3    Extremity/Trunk Assessment Upper Extremity Assessment LUE Deficits / Details: appears to demonstrates a grasp (palmer reflex) during assessment. pt with increased flexion of digits with anything in palm with pressure. Needs further assessement. Pt does appear to release object x3 when asked to release object. LUE Sensation: decreased light touch;decreased proprioception LUE Coordination: decreased fine motor;decreased gross motor   Lower Extremity Assessment Lower Extremity Assessment: Defer to PT evaluation        Vision   Vision Assessment?: Vision impaired- to be further tested in functional context Additional Comments: L eye taped shut. Possible need to assess a eye weight in future sessions. Visual scanning tested. No horizontal tracking. Pt able to accurate visually sustain attention to Yes and NO sign. pt able to accurately answer 6 out 6 questions yes or no to the question. pt able to hold gaze for the count to 5 showing clear visual choice. pt noted to have decreased visual bobbing compared to prior  session. pt needs use of central vision of R eye for increase participation. ot asked to close eye and keep eye closed with reinforced cue keep eye closed keep eye closed and then Look at me. pt with accurate return demo x2.   Perception     Praxis     Communication Communication Communication: Impaired Factors Affecting Communication: Trach/intubated   Cognition Arousal: Alert Behavior During Therapy: Flat affect Cognition: Difficult to assess Difficult to assess due to: Impaired communication, Tracheostomy           OT - Cognition Comments: R eye visually  attending and following commands.                 Following commands: Impaired Following commands impaired: Follows one step commands with increased time      Cueing   Cueing Techniques: Visual cues  Exercises Other Exercises Other Exercises: session focused on visual tracking and communication to therapist with cognitive accuracy.    Shoulder Instructions       General Comments      Pertinent Vitals/ Pain       Pain Assessment Pain Assessment: Faces Pain Score: 0-No pain Pain Intervention(s): Other (comment) (used sign to look at NO)  Home Living                                          Prior Functioning/Environment              Frequency  Min 1X/week        Progress Toward Goals  OT Goals(current goals can now be found in the care plan section)  Progress towards OT goals: Progressing toward goals  Acute Rehab OT Goals Patient Stated Goal: unable to particiapte OT Goal Formulation: Patient unable to participate in goal setting Time For Goal Achievement: 01/09/24 Potential to Achieve Goals: Fair ADL Goals Additional ADL Goal #1: Pt will follow 1 step commands with 50% accuracy in a nondistracting enviornment. Additional ADL Goal #2: Pt will complete bed mobility with max assist +2, maintain sitting balance statically for 3 minutes with mod assist. Additional ADL Goal #3: Pt will demonstrates grasp and release with L UE x3 out 3 attempts  Plan      Co-evaluation                 AM-PAC OT "6 Clicks" Daily Activity     Outcome Measure   Help from another person eating meals?: Total Help from another person taking care of personal grooming?: Total Help from another person toileting, which includes using toliet, bedpan, or urinal?: Total Help from another person bathing (including washing, rinsing, drying)?: Total Help from another person to put on and taking off regular upper body clothing?: Total Help from another person  to put on and taking off regular lower body clothing?: Total 6 Click Score: 6    End of Session Equipment Utilized During Treatment: Oxygen  OT Visit Diagnosis: Other abnormalities of gait and mobility (R26.89);Muscle weakness (generalized) (M62.81);Other symptoms and signs involving the nervous system (R29.898);Other symptoms and signs involving cognitive function;Hemiplegia and hemiparesis Hemiplegia - caused by: Nontraumatic SAH   Activity Tolerance Patient tolerated treatment well   Patient Left in bed;with call bell/phone within reach   Nurse Communication Precautions;Need for lift equipment        Time: 1610-9604 OT Time Calculation (min): 13 min  Charges: OT General  Charges $OT Visit: 1 Visit OT Treatments $Therapeutic Activity: 8-22 mins   Brynn, OTR/L  Acute Rehabilitation Services Office: [phone] .   Neomia Banner 12/26/2023, 3:53 PM

## 2023-12-26 NOTE — Progress Notes (Signed)
 PROGRESS NOTE    Garrett [name]  RUE:[id number] DOB: 07-May-1976 DOA: 10/27/2023 PCP: Pcp, No   Brief Narrative:  Patient is a 48 year old male with no known medical history.  Patient found down on 10/27/2023 unresponsive with UDS positive for opiates, minimal responsiveness to Narcan .  Found to be markedly hypertensive at 260/156.  CT head at intake shows 4.1 cm intracranial hemorrhage at the pons with intraventricular extension into the fourth ventricle and basal cisterns.  Initially admitted by critical care, now extubated to tracheostomy which was placed 3/22.  PEG placed 3/24.  Patient continues to be medically stable for discharge at this time, he does carry high risk for complications in the setting of his need for total care.  Questionable aspiration pneumonia noted during hospitalization now status posttreatment.  Disposition remains difficult given patient's needs and family understands his poor prognosis, as such patient was made DNR per discussion with family as they would not want any further aggressive or heroic measures to be taken.  Patient remains medically stable for discharge patient's disability should be updated in June/July which should assist with placement.    Major events: 3/8: Intubated in ED, pontine bleed/SAH. CT head 17:09 Acute large hemorrhage 4.1 cm in pons with intraventricular extension without hydrocephalus 3/9: CTA 03:14 AM No change in IPH in pons, similar biparietal [facility] 3/14: Now awake, appears locked in can follow commands with eyes. 3/16 Purposeful with left upper extremity  3/22 tracheostomy placed at bedside, bleeding issues overnight from trach site 3/24 PEG (Dr. Hildy Lowers) 3/24 sputum culture with MRSA 3/27 vomited. TF on hold. Abd film c/w ileus. Added reglan , got SSE. Did tolerate PSV all day  3/28 BMs x2 after SSE day prior. Added back TFs at 1/2 rated. Tolerated PS 3/29 Tolerated PS  3/31 Tolerating CPAP PS 15/5, TF on hold due to ileus. 4/1:  con't to hold tube feeds today, restarting vancomycin  and sending tracheal aspirate as peaks are 37, plat 24, driving 19. Thick secretions. Fever overnight.  4/2: peaks improved. Ongoing hiccups. Trickle feeding. Neuro exam unchanged.  4/20: trach was changed to cuffless #8 5/1: Mental status appears to be somewhat improving off narcotics, more awake alert following simple commands(able to grimace, move left toes, track vertically with right eye).  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 Catastrophic injury Repeat CT head 3/9,3/18 and 3/29 with decreased size Echo EF 60-65% A1c 4.7 LDL 96 Narcotics and modafanil held--watch mentation--restart modafanil if sleepier Keep BP below 160--po amlodipine  10 daily with prn labetalol  and hydralazine  IV   Acute respiratory failure  In the setting of large pontine stroke/brainstem compression Unable to protect airway Trach placed 11/10/23 changed to cuffless 4/21 Pulmonology following - 5/1: Shiley cuffless #6 XLT proximal   Aspiration pneumonia, multifactorial, resolving Complicated by recently placed trach (11/10/23) Dysphagia in the setting of CVA and ongoing dysphagia  SLP continues to follow as above Completed vancomycin /linezolid  12/04/2023  Moderate to severe protein caloric malnutrition, POA Continue Osmolite, Prosource, free water ,--- Osmolite  50 cc/H,   Prosource 60cc 3 times daily Free water  management as below   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 Recommendations from ophthalmology 4/7-(4/7): maxitrol ointment TID x1 week followed by Refresh PM TID-QID for chronic lubrication; can tape eyelid closed if exposure is still an issue   AKI earlier in hospital stay with acute urinary retention Foley replaced 4/13 will need to continue; prior removals/voiding trial unsuccessful Free water  decreased to 200 q4h given minimally downtrending sodium, continue to follow, appreciate dietary insight recommendations   Macrocytic anemia thrombocytosis Overall stable/resolved Perineal skin injury, POA - Continue Gerhard's Butt cream   DVT prophylaxis: SCDs Start: 10/27/23 2226 Code Status:   Code Status: Do not attempt resuscitation (DNR) PRE-ARREST INTERVENTIONS DESIRED Family Communication: None present  Status is: Inpatient  Dispo: The patient is from: Home              Anticipated d/c is to: Facility              Anticipated d/c date is: Imminent              Patient currently is medically stable for discharge  Consultants:  PCCM, palliative care, general surgery, ophthalmology  Antimicrobials:  None currently  Subjective:  No changes overall since last evaluation Overall looks aboutt he same Not interactive today, did blink some in response to my quesitons yes blick once, no blinkc x 2  Objective: Vitals:   12/26/23 0745 12/26/23 0904 12/26/23 1127 12/26/23 1155  BP: (!) 151/97  (!) 151/99   Pulse: 90 97 86 94  Resp: 19 (!) 23 14 19   Temp: 97.8 F (36.6 C)  98.6 F (37 C)   TempSrc: Axillary  Oral   SpO2: 98% 97% 98% 98%  Weight:      Height:        Intake/Output Summary (Last 24 hours) at 12/26/2023 1539 Last data filed at 12/26/2023 1500 Gross per 24 hour  Intake 2960 ml  Output 2325 ml  Net 635 ml   Filed Weights   12/24/23 1949 12/25/23 0434 12/26/23 0454  Weight: 96.6 kg 96.6 kg 98.6 kg     Examination:  Awake cannot communicate No ict, L eye is taped Cta b no wheeze no rales Abd soft, PEG in place Foley in place No LE edema Skin not examined today  Data Reviewed: I have personally reviewed following labs and imaging studies  CBC: No results for input(s): "WBC", "NEUTROABS", "HGB", "HCT", "MCV", "PLT" in the last 168 hours.  Basic Metabolic Panel: Recent Labs  Lab 12/20/23 1216  NA 134*  K 4.2  CL 98  CO2 25  GLUCOSE 128*  BUN 26*  CREATININE 0.69  CALCIUM 9.3   GFR: Estimated Creatinine Clearance: 130 mL/min (by C-G formula based on SCr of 0.69 mg/dL).  CBG: Recent Labs  Lab 12/25/23 2001 12/25/23 2303 12/26/23 0340 12/26/23 0744 12/26/23 1155  GLUCAP 113* 119* 131* 133* 119*   Radiology Studies: No results found.  Scheduled Meds:  amLODipine   10 mg Per Tube Daily   artificial tears   Left Eye Q8H   Chlorhexidine  Gluconate Cloth  6 each Topical Daily   enoxaparin  (LOVENOX ) injection  50 mg Subcutaneous Daily   famotidine   20 mg Per Tube BID   feeding supplement (PROSource TF20)  60 mL Per Tube TID   free water   200 mL Per Tube Q4H   insulin  aspart  0-15 Units Subcutaneous Q4H   metoprolol  tartrate  100 mg Per Tube  BID   multivitamin with minerals  1 tablet Per Tube Daily   nutrition supplement (JUVEN)  1 packet Per Tube BID BM   mouth rinse  15 mL Mouth Rinse 4 times per day   Continuous Infusions:  feeding supplement (OSMOLITE 1.5 CAL) 50 mL/hr at 12/26/23 1500     LOS: 60 days   Time spent: 55min  Jai-Gurmukh Olof Marcil, DO [facility]  If 7PM-7AM, please contact night-coverage [email or web address]  12/26/2023, 3:39 PM

## 2023-12-27 DIAGNOSIS — I613 Nontraumatic intracerebral hemorrhage in brain stem: Secondary | ICD-10-CM | POA: Diagnosis not present

## 2023-12-27 LAB — GLUCOSE, CAPILLARY
Glucose-Capillary: 116 mg/dL — ABNORMAL HIGH (ref 70–99)
Glucose-Capillary: 134 mg/dL — ABNORMAL HIGH (ref 70–99)
Glucose-Capillary: 141 mg/dL — ABNORMAL HIGH (ref 70–99)
Glucose-Capillary: 135 mg/dL — ABNORMAL HIGH (ref 70–99)
Glucose-Capillary: 103 mg/dL — ABNORMAL HIGH (ref 70–99)
Glucose-Capillary: 94 mg/dL (ref 70–99)

## 2023-12-27 NOTE — Progress Notes (Signed)
 Was able to assisted patient out of bed to chair using lift. Left eye appear to stay closed, therefore, will leave gauze dressing off to give the site a break. Patient's significant other remains at bedside all day today and have been very helpful and hands-on. Aaron Aas

## 2023-12-27 NOTE — Progress Notes (Signed)
 Nutrition Follow-up  DOCUMENTATION CODES:   Not applicable  INTERVENTION:   Continue tube feeds via PEG: - Osmolite 1.5 @ 50 mL/hr (1200 mL/day) - PROSource TF20 60 mL TID - Free water  flush 200 mL every 4 hours or per MD   Tube feeding regimen provides 2040 kcal, 135 grams of protein, and 914 ml of H2O.    Total free water  with flushes: 2114 mL   - Continue MVI with minerals daily per tube - Continue 1 packet Juven BID per tube to support wound healing, each packet provides 95 calories, 2.5 grams of protein (collagen), and 9.8 grams of carbohydrate   NUTRITION DIAGNOSIS:   Inadequate oral intake related to inability to eat as evidenced by NPO status.  - Still applicable   GOAL:   Patient will meet greater than or equal to 90% of their needs  - Meeting via TF'S  MONITOR:   Labs, Weight trends, TF tolerance, Skin  REASON FOR ASSESSMENT:   Consult Enteral/tube feeding initiation and management  ASSESSMENT:   Pt with PMH of HTN, daily mariajuana, and smoker admitted after being found down at home with pontine hemorrhagic stroke and trace SAH. UDS positive for opioids. Noted aspiration PNA on admission.  3/08 - admitted with SAH, intubated 3/09 - adult TF protocol started 3/14 - s/p cortrak placement (tip gastric) 3/22 - s/p tracheostomy 3/24 - s/p PEG 3/26 - emesis, TF paused 3/28 - trickle TF starated 3/31 - TF off due to ileus 4/02 - trickle TF started  4/04 - TF back at goal 4/08 - episode of vomiting x 2, TF paused then resumed at trickle 4/11 - TF back at goal   Patient awake but cannot communicate. Was abe to open eyes and blink. Continues with cuff less trach. Family at beside, hopeful for patient's improvement. Discussed with RN, patient continues to tolerate TF's. No nausea or vomiting noted. Had BM last night.  SLP continuing to follow.   Admit weight: 103 kg  Current weight: 98.6 kg   Nutritionally Relevant Medications: Scheduled Meds:   amLODipine   10 mg Per Tube Daily   artificial tears   Left Eye Q8H   Chlorhexidine  Gluconate Cloth  6 each Topical Daily   enoxaparin  (LOVENOX ) injection  50 mg Subcutaneous Daily   famotidine   20 mg Per Tube BID   feeding supplement (PROSource TF20)  60 mL Per Tube TID   free water   200 mL Per Tube Q4H   insulin  aspart  0-15 Units Subcutaneous Q4H   metoprolol  tartrate  100 mg Per Tube BID   multivitamin with minerals  1 tablet Per Tube Daily   nutrition supplement (JUVEN)  1 packet Per Tube BID BM   mouth rinse  15 mL Mouth Rinse 4 times per day   Continuous Infusions:  feeding supplement (OSMOLITE 1.5 CAL) 50 mL/hr at 12/27/23 0700   Labs Reviewed: No updated labs CBG ranges from 114-134 mg/dL over the last 24 hours HgbA1c 4.7  NUTRITION - FOCUSED PHYSICAL EXAM:  Flowsheet Row Most Recent Value  Orbital Region No depletion  Upper Arm Region No depletion  Thoracic and Lumbar Region No depletion  Buccal Region No depletion  Temple Region No depletion  Clavicle Bone Region No depletion  Clavicle and Acromion Bone Region No depletion  Scapular Bone Region No depletion  Dorsal Hand No depletion  Patellar Region No depletion  Anterior Thigh Region No depletion  Posterior Calf Region No depletion  Edema (RD Assessment) None  Hair  Reviewed  Eyes Reviewed  [Left eye taped]  Mouth Unable to assess  Skin Reviewed  Nails Reviewed       Diet Order:   Diet Order             Diet NPO time specified  Diet effective now                   EDUCATION NEEDS:   No education needs have been identified at this time  Skin:  Skin Assessment: Skin Integrity Issues: Skin Integrity Issues:: Other (Comment) Stage II: Stage 2, perineum Other: Non pressure wound, left eye  Last BM:  12/26/2023 type 6  Height:   Ht Readings from Last 1 Encounters:  11/22/23 5\' 8"  (1.727 m)    Weight:   Wt Readings from Last 1 Encounters:  12/26/23 98.6 kg    Ideal Body Weight:  70  kg  BMI:  Body mass index is 33.06 kg/m.  Estimated Nutritional Needs:   Kcal:  2000-2200  Protein:  125-140 grams  Fluid:  >2 L/day   Frederik Jansky, RD Registered Dietitian  See Amion for more information

## 2023-12-27 NOTE — Plan of Care (Signed)
  Problem: Education: Goal: Knowledge of disease or condition will improve Outcome: Progressing Goal: Knowledge of secondary prevention will improve (MUST DOCUMENT ALL) Outcome: Progressing Goal: Knowledge of patient specific risk factors will improve (DELETE if not current risk factor) Outcome: Progressing   Problem: Intracerebral Hemorrhage Tissue Perfusion: Goal: Complications of Intracerebral Hemorrhage will be minimized Outcome: Progressing   Problem: Coping: Goal: Will verbalize positive feelings about self Outcome: Progressing Goal: Will identify appropriate support needs Outcome: Progressing   Problem: Health Behavior/Discharge Planning: Goal: Ability to manage health-related needs will improve Outcome: Progressing Goal: Goals will be collaboratively established with patient/family Outcome: Progressing   Problem: Self-Care: Goal: Ability to participate in self-care as condition permits will improve Outcome: Progressing Goal: Verbalization of feelings and concerns over difficulty with self-care will improve Outcome: Progressing Goal: Ability to communicate needs accurately will improve Outcome: Progressing   Problem: Nutrition: Goal: Risk of aspiration will decrease Outcome: Progressing Goal: Dietary intake will improve Outcome: Progressing   Problem: Education: Goal: Ability to describe self-care measures that may prevent or decrease complications (Diabetes Survival Skills Education) will improve Outcome: Progressing   Problem: Coping: Goal: Ability to adjust to condition or change in health will improve Outcome: Progressing   Problem: Fluid Volume: Goal: Ability to maintain a balanced intake and output will improve Outcome: Progressing   Problem: Health Behavior/Discharge Planning: Goal: Ability to identify and utilize available resources and services will improve Outcome: Progressing Goal: Ability to manage health-related needs will improve Outcome:  Progressing   Problem: Metabolic: Goal: Ability to maintain appropriate glucose levels will improve Outcome: Progressing   Problem: Nutritional: Goal: Maintenance of adequate nutrition will improve Outcome: Progressing Goal: Progress toward achieving an optimal weight will improve Outcome: Progressing   Problem: Skin Integrity: Goal: Risk for impaired skin integrity will decrease Outcome: Progressing   Problem: Tissue Perfusion: Goal: Adequacy of tissue perfusion will improve Outcome: Progressing   Problem: Education: Goal: Knowledge of General Education information will improve Description: Including pain rating scale, medication(s)/side effects and non-pharmacologic comfort measures Outcome: Progressing   Problem: Health Behavior/Discharge Planning: Goal: Ability to manage health-related needs will improve Outcome: Progressing   Problem: Clinical Measurements: Goal: Ability to maintain clinical measurements within normal limits will improve Outcome: Progressing Goal: Will remain free from infection Outcome: Progressing Goal: Diagnostic test results will improve Outcome: Progressing Goal: Respiratory complications will improve Outcome: Progressing Goal: Cardiovascular complication will be avoided Outcome: Progressing   Problem: Activity: Goal: Risk for activity intolerance will decrease Outcome: Progressing   Problem: Nutrition: Goal: Adequate nutrition will be maintained Outcome: Progressing   Problem: Coping: Goal: Level of anxiety will decrease Outcome: Progressing   Problem: Elimination: Goal: Will not experience complications related to bowel motility Outcome: Progressing Goal: Will not experience complications related to urinary retention Outcome: Progressing   Problem: Pain Managment: Goal: General experience of comfort will improve and/or be controlled Outcome: Progressing   Problem: Safety: Goal: Ability to remain free from injury will  improve Outcome: Progressing   Problem: Skin Integrity: Goal: Risk for impaired skin integrity will decrease Outcome: Progressing   Problem: Education: Goal: 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 Outcome: Progressing   Problem: Activity: Goal: Ability to tolerate increased activity will improve Outcome: Progressing   Problem: Health Behavior/Discharge Planning: Goal: Ability to manage tracheostomy will improve Outcome: Progressing   Problem: Respiratory: Goal: Patent airway maintenance will improve Outcome: Progressing   Problem: Role Relationship: Goal: Ability to communicate will improve Outcome: Progressing

## 2023-12-27 NOTE — Progress Notes (Signed)
 Speech Language Pathology Treatment: Cognitive-Linquistic  Patient Details Name: Garrett [name] MRN: [id number] DOB: 1975-08-28 Today's Date: 12/27/2023 Time: 6578-4696 SLP Time Calculation (min) (ACUTE ONLY): 20 min  Assessment / Plan / Recommendation Clinical Impression  Pt was seen with significant other, Garrett [name], present. He kept his eyes closed a lot of the session - question lethargy as Garrett [name] said he had been sleeping before, but also question if some of it may have been intentional, as he would open his eyes whenever SLP talked about leaving. He did open his R eye long enough to answer limited questions. He looked to "yes" and "no" (vertical field of two) x1 each, accurate both times. He was asked three additional yes/no questions, answering the first two accurately but then keeping his eyes closed when asked the third. PMV was donned throughout but with no voicing noted.   The remainder of the session focused on education with [name]. We discussed trying to use some consistency in how we communicate, and yes/no sign was introduced to her as a means of keeping consistency. Also discussed how to use it, including making sure he is attentive, and what kinds of question to use to check for accuracy before asking other questions. We broached the subject of auditory scanning with use of yes/no questions, but will need to practice this in subsequent sessions. In future session, will also want to target PMV training with Garrett [name] so that she can don/doff it as well.   HPI HPI: Pt is a 47 yo male who was found down 10/27/23 with agonal breathing. 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 Intubated 3/8, cortrak placed 3/14, trach placed 3/22, PEG placed 3/24. PMH: HTN, smoker      SLP Plan  Continue with current plan of care       Recommendations for follow up therapy are one component of a multi-disciplinary discharge planning process, led by the attending physician.  Recommendations may be updated based on patient status, additional functional criteria and insurance authorization.    Recommendations         Patient may use Passy-Muir Speech Valve: During all therapies with supervision;Intermittently with supervision PMSV Supervision: Full           Oral care QID   Frequent or constant Supervision/Assistance Cognitive communication deficit (R41.841);Dysarthria and anarthria (R47.1)     Continue with current plan of care     Garrett [name]., M.A. CCC-SLP Acute Rehabilitation Services Office: [phone]  Secure chat preferred   12/27/2023, 3:03 PM

## 2023-12-27 NOTE — Progress Notes (Signed)
 PROGRESS NOTE    Garrett [name]  NFA:[id number] DOB: May 31, 1976 DOA: 10/27/2023 PCP: Pcp, No   Brief Narrative:  Patient is a 48 year old male with no known medical history.  Patient found down on 10/27/2023 unresponsive with UDS positive for opiates, minimal responsiveness to Narcan .  Found to be markedly hypertensive at 260/156.  CT head at intake shows 4.1 cm intracranial hemorrhage at the pons with intraventricular extension into the fourth ventricle and basal cisterns.  Initially admitted by critical care, now extubated to tracheostomy which was placed 3/22.  PEG placed 3/24.  Patient continues to be medically stable for discharge at this time, he does carry high risk for complications in the setting of his need for total care.  Questionable aspiration pneumonia noted during hospitalization now status posttreatment.  Disposition remains difficult given patient's needs and family understands his poor prognosis, as such patient was made DNR per discussion with family as they would not want any further aggressive or heroic measures to be taken.  Patient remains medically stable for discharge patient's disability should be updated in June/July which should assist with placement.    Major events: 3/8: Intubated in ED, pontine bleed/SAH. CT head 17:09 Acute large hemorrhage 4.1 cm in pons with intraventricular extension without hydrocephalus 3/9: CTA 03:14 AM No change in IPH in pons, similar biparietal [facility] 3/14: Now awake, appears locked in can follow commands with eyes. 3/16 Purposeful with left upper extremity  3/22 tracheostomy placed at bedside, bleeding issues overnight from trach site 3/24 PEG (Dr. Hildy Lowers) 3/24 sputum culture with MRSA 3/27 vomited. TF on hold. Abd film c/w ileus. Added reglan , got SSE. Did tolerate PSV all day  3/28 BMs x2 after SSE day prior. Added back TFs at 1/2 rated. Tolerated PS 3/29 Tolerated PS  3/31 Tolerating CPAP PS 15/5, TF on hold due to ileus. 4/1:  con't to hold tube feeds today, restarting vancomycin  and sending tracheal aspirate as peaks are 37, plat 24, driving 19. Thick secretions. Fever overnight.  4/2: peaks improved. Ongoing hiccups. Trickle feeding. Neuro exam unchanged.  4/20: trach was changed to cuffless #8 5/1: Mental status appears to be somewhat improving off narcotics, more awake alert following simple commands(able to grimace, move left toes, track vertically with right eye).  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 Catastrophic injury Repeat CT head 3/9,3/18 and 3/29 with decreased size Echo EF 60-65% A1c 4.7 LDL 96 Narcotics and modafanil held--watch mentation--restart modafanil if sleepier goal BP below 160--po amlodipine  10 daily with prn labetalol  and hydralazine  IV   Acute respiratory failure  In the setting of large pontine stroke/brainstem compression Unable to protect airway Trach placed 11/10/23 changed to cuffless 4/21 Pulmonology following - 5/1: Shiley cuffless #6 XLT proximal   Aspiration pneumonia, multifactorial, resolving Complicated by recently placed trach (11/10/23) Dysphagia in the setting of CVA and ongoing dysphagia  SLP continues to follow as above Completed vancomycin /linezolid  12/04/2023  Moderate to severe protein caloric malnutrition, POA Continue Osmolite, Prosource, free water ,--- Osmolite  50 cc/H,   Prosource 60cc 3 times daily Free water  management as below   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 Recommendations from ophthalmology 4/7-(4/7): maxitrol ointment TID x1 week followed by Refresh PM TID-QID for chronic lubrication; can tape eyelid closed if exposure is still an issue   AKI earlier in hospital stay with acute urinary retention Foley replaced 4/13 will need to continue; prior removals/voiding trial unsuccessful Free water  decreased to 200 q4h given minimally downtrending sodium, continue to follow, appreciate dietary insight recommendations   Macrocytic anemia thrombocytosis Overall stable/resolved Perineal skin injury, POA - Continue Gerhard's Butt cream   DVT prophylaxis: SCDs Start: 10/27/23 2226 Code Status:   Code Status: Do not attempt resuscitation (DNR) PRE-ARREST INTERVENTIONS DESIRED Family Communication  D/w GF present briefly today  Status is: Inpatient  Dispo: The patient is from: Home              Anticipated d/c is to: Facility              Anticipated d/c date is: Imminent              Patient currently is medically stable for discharge  Consultants:  PCCM, palliative care, general surgery, ophthalmology  Antimicrobials:  None currently  Subjective:  No changes overall since last evaluation Maybe moving a little more GF tells me therapy worked with him on yes/no answers with 2 signs and him looking at the signs to respond  Objective: Vitals:   12/26/23 2350 12/27/23 0000 12/27/23 0343 12/27/23 0824  BP:  (!) 132/96 (!) 122/90 120/89  Pulse: 84  85 88  Resp: 16 17 14 13   Temp:  98.9 F (37.2 C) 98.8 F (37.1 C)   TempSrc:  Oral Oral   SpO2: 97% 97% 97% 97%  Weight:      Height:        Intake/Output Summary (Last 24 hours) at 12/27/2023 1230 Last data filed at 12/27/2023 0700 Gross per 24 hour  Intake 1750 ml  Output 625 ml  Net 1125 ml   Filed Weights   12/24/23 1949 12/25/23 0434 12/26/23 0454  Weight: 96.6 kg 96.6 kg 98.6  kg    Examination:  Awake cannot communicate verbally blinks occasioanlly to quesitons No ict, L eye is taped Cta b no wheeze no rales Abd soft, PEG in place Foley in place No LE edema Skin not examined today  Data Reviewed: I have personally reviewed following labs and imaging studies  CBC: No results for input(s): "WBC", "NEUTROABS", "HGB", "HCT", "MCV", "PLT" in the last 168 hours.  Basic Metabolic Panel: No results for input(s): "NA", "K", "CL", "CO2", "GLUCOSE", "BUN", "CREATININE", "CALCIUM", "MG", "PHOS" in the last 168 hours.  GFR: Estimated Creatinine Clearance: 130 mL/min (by C-G formula based on SCr of 0.69 mg/dL).  CBG: Recent Labs  Lab 12/26/23 1542 12/26/23 2020 12/26/23 2344 12/27/23 0753 12/27/23 1216  GLUCAP 114* 136* 116* 134* 141*   Radiology Studies: No results found.  Scheduled Meds:  amLODipine   10 mg Per Tube Daily   artificial tears   Left Eye Q8H   Chlorhexidine  Gluconate Cloth  6 each Topical Daily   enoxaparin  (LOVENOX ) injection  50 mg Subcutaneous Daily   famotidine   20 mg Per Tube BID   feeding supplement (PROSource TF20)  60 mL Per Tube TID   free water   200 mL Per Tube Q4H   insulin  aspart  0-15 Units Subcutaneous Q4H   metoprolol  tartrate  100 mg Per Tube BID  multivitamin with minerals  1 tablet Per Tube Daily   nutrition supplement (JUVEN)  1 packet Per Tube BID BM   mouth rinse  15 mL Mouth Rinse 4 times per day   Continuous Infusions:  feeding supplement (OSMOLITE 1.5 CAL) 50 mL/hr at 12/27/23 0700     LOS: 61 days   Time spent:  Jai-Gurmukh Felishia Wartman,  [facility]  If 7PM-7AM, please contact night-coverage [email or web address]  12/27/2023, 12:30 PM

## 2023-12-28 DIAGNOSIS — I613 Nontraumatic intracerebral hemorrhage in brain stem: Secondary | ICD-10-CM | POA: Diagnosis not present

## 2023-12-28 LAB — GLUCOSE, CAPILLARY
Glucose-Capillary: 139 mg/dL — ABNORMAL HIGH (ref 70–99)
Glucose-Capillary: 125 mg/dL — ABNORMAL HIGH (ref 70–99)
Glucose-Capillary: 136 mg/dL — ABNORMAL HIGH (ref 70–99)
Glucose-Capillary: 124 mg/dL — ABNORMAL HIGH (ref 70–99)
Glucose-Capillary: 122 mg/dL — ABNORMAL HIGH (ref 70–99)
Glucose-Capillary: 126 mg/dL — ABNORMAL HIGH (ref 70–99)

## 2023-12-28 NOTE — Plan of Care (Signed)
  Problem: Education: Goal: Knowledge of disease or condition will improve Outcome: Progressing Goal: Knowledge of secondary prevention will improve (MUST DOCUMENT ALL) Outcome: Progressing Goal: Knowledge of patient specific risk factors will improve (DELETE if not current risk factor) Outcome: Progressing   Problem: Intracerebral Hemorrhage Tissue Perfusion: Goal: Complications of Intracerebral Hemorrhage will be minimized Outcome: Progressing   Problem: Coping: Goal: Will verbalize positive feelings about self Outcome: Progressing Goal: Will identify appropriate support needs Outcome: Progressing   Problem: Health Behavior/Discharge Planning: Goal: Ability to manage health-related needs will improve Outcome: Progressing Goal: Goals will be collaboratively established with patient/family Outcome: Progressing   Problem: Self-Care: Goal: Ability to participate in self-care as condition permits will improve Outcome: Progressing Goal: Verbalization of feelings and concerns over difficulty with self-care will improve Outcome: Progressing Goal: Ability to communicate needs accurately will improve Outcome: Progressing   Problem: Nutrition: Goal: Risk of aspiration will decrease Outcome: Progressing Goal: Dietary intake will improve Outcome: Progressing   Problem: Education: Goal: Ability to describe self-care measures that may prevent or decrease complications (Diabetes Survival Skills Education) will improve Outcome: Progressing   Problem: Coping: Goal: Ability to adjust to condition or change in health will improve Outcome: Progressing   Problem: Fluid Volume: Goal: Ability to maintain a balanced intake and output will improve Outcome: Progressing   Problem: Health Behavior/Discharge Planning: Goal: Ability to identify and utilize available resources and services will improve Outcome: Progressing Goal: Ability to manage health-related needs will improve Outcome:  Progressing   Problem: Metabolic: Goal: Ability to maintain appropriate glucose levels will improve Outcome: Progressing   Problem: Nutritional: Goal: Maintenance of adequate nutrition will improve Outcome: Progressing Goal: Progress toward achieving an optimal weight will improve Outcome: Progressing   Problem: Skin Integrity: Goal: Risk for impaired skin integrity will decrease Outcome: Progressing   Problem: Tissue Perfusion: Goal: Adequacy of tissue perfusion will improve Outcome: Progressing   Problem: Education: Goal: Knowledge of General Education information will improve Description: Including pain rating scale, medication(s)/side effects and non-pharmacologic comfort measures Outcome: Progressing   Problem: Health Behavior/Discharge Planning: Goal: Ability to manage health-related needs will improve Outcome: Progressing   Problem: Clinical Measurements: Goal: Ability to maintain clinical measurements within normal limits will improve Outcome: Progressing Goal: Will remain free from infection Outcome: Progressing Goal: Diagnostic test results will improve Outcome: Progressing Goal: Respiratory complications will improve Outcome: Progressing Goal: Cardiovascular complication will be avoided Outcome: Progressing   Problem: Activity: Goal: Risk for activity intolerance will decrease Outcome: Progressing   Problem: Nutrition: Goal: Adequate nutrition will be maintained Outcome: Progressing   Problem: Coping: Goal: Level of anxiety will decrease Outcome: Progressing   Problem: Elimination: Goal: Will not experience complications related to bowel motility Outcome: Progressing Goal: Will not experience complications related to urinary retention Outcome: Progressing   Problem: Pain Managment: Goal: General experience of comfort will improve and/or be controlled Outcome: Progressing   Problem: Safety: Goal: Ability to remain free from injury will  improve Outcome: Progressing   Problem: Skin Integrity: Goal: Risk for impaired skin integrity will decrease Outcome: Progressing   Problem: Education: Goal: 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 Outcome: Progressing   Problem: Activity: Goal: Ability to tolerate increased activity will improve Outcome: Progressing   Problem: Health Behavior/Discharge Planning: Goal: Ability to manage tracheostomy will improve Outcome: Progressing   Problem: Respiratory: Goal: Patent airway maintenance will improve Outcome: Progressing   Problem: Role Relationship: Goal: Ability to communicate will improve Outcome: Progressing

## 2023-12-28 NOTE — Progress Notes (Signed)
 PROGRESS NOTE    Garrett [name]  ZOX:[id number] DOB: 03/07/1976 DOA: 10/27/2023 PCP: Pcp, No   Brief Narrative:  Patient is a 48 year old male with no known medical history.  Patient found down on 10/27/2023 unresponsive with UDS positive for opiates, minimal responsiveness to Narcan .  Found to be markedly hypertensive at 260/156.  CT head at intake shows 4.1 cm intracranial hemorrhage at the pons with intraventricular extension into the fourth ventricle and basal cisterns.  Initially admitted by critical care, now extubated to tracheostomy which was placed 3/22.  PEG placed 3/24.  Patient continues to be medically stable for discharge at this time, he does carry high risk for complications in the setting of his need for total care.  Questionable aspiration pneumonia noted during hospitalization now status posttreatment.  Disposition remains difficult given patient's needs and family understands his poor prognosis, as such patient was made DNR per discussion with family as they would not want any further aggressive or heroic measures to be taken.  Patient remains medically stable for discharge patient's disability should be updated in June/July which should assist with placement.    Major events: 3/8: Intubated in ED, pontine bleed/SAH. CT head 17:09 Acute large hemorrhage 4.1 cm in pons with intraventricular extension without hydrocephalus 3/9: CTA 03:14 AM No change in IPH in pons, similar biparietal [facility] 3/14: Now awake, appears locked in can follow commands with eyes. 3/16 Purposeful with left upper extremity  3/22 tracheostomy placed at bedside, bleeding issues overnight from trach site 3/24 PEG (Dr. Hildy Lowers) 3/24 sputum culture with MRSA 3/27 vomited. TF on hold. Abd film c/w ileus. Added reglan , got SSE. Did tolerate PSV all day  3/28 BMs x2 after SSE day prior. Added back TFs at 1/2 rated. Tolerated PS 3/29 Tolerated PS  3/31 Tolerating CPAP PS 15/5, TF on hold due to ileus. 4/1:  con't to hold tube feeds today, restarting vancomycin  and sending tracheal aspirate as peaks are 37, plat 24, driving 19. Thick secretions. Fever overnight.  4/2: peaks improved. Ongoing hiccups. Trickle feeding. Neuro exam unchanged.  4/20: trach was changed to cuffless #8 5/1: Mental status appears to be somewhat improving off narcotics, more awake alert following simple commands(able to grimace, move left toes, track vertically with right eye).  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 Catastrophic injury Repeat CT head 3/9,3/18 and 3/29 with decreased size Echo EF 60-65% A1c 4.7 LDL 96 Narcotics and modafanil held--seems more coherent overall--working intermittently with SLP goal BP below 160--po amlodipine  10 daily with prn labetalol  and hydralazine  IV   Acute respiratory failure  In the setting of large pontine stroke/brainstem compression Unable to protect airway Trach placed 11/10/23 changed to cuffless 4/21 Pulmonology following - 5/1: Shiley cuffless #6 XLT proximal   Aspiration pneumonia, multifactorial, resolving Complicated by recently placed trach (11/10/23) Dysphagia in the setting of CVA and ongoing dysphagia  SLP continues to follow as above Completed vancomycin /linezolid  12/04/2023  Moderate to severe protein caloric malnutrition, POA Continue Osmolite, Prosource, free  water ,--- Osmolite 50  cc/H,  Prosource 60cc 3 times daily Free water  management as below   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 Recommendations from ophthalmology 4/7-(4/7): maxitrol ointment TID x1 week followed by Refresh PM TID-QID for chronic lubrication; can tape eyelid closed if exposure is still an issue   AKI earlier in hospital stay with acute urinary retention Foley replaced 4/13 will need to continue; prior removals/voiding trial unsuccessful Free water  decreased to 200 q4h given minimally downtrending sodium, continue to follow, labs peridoically   Macrocytic anemia thrombocytosis Overall stable/resolved Perineal skin injury, POA - Continue Gerhard's Butt cream--no wounds on exam when skin examined with Nursing on 5/9   DVT prophylaxis: SCDs Start: 10/27/23 2226 Code Status:   Code Status: Do not attempt resuscitation (DNR) PRE-ARREST INTERVENTIONS DESIRED Family Communication  No family today  Status is: Inpatient  Dispo: The patient is from: Home              Anticipated d/c is to: Facility              Anticipated d/c date is: Imminent              Patient currently is medically stable for discharge  Consultants:  PCCM, palliative care, general surgery, ophthalmology  Antimicrobials:  None currently  Subjective:  More coherent Seems to blink meaningfully at times No fever n/v Examined skin with help of nursing   Objective: Vitals:   12/28/23 0332 12/28/23 0400 12/28/23 0800 12/28/23 1200  BP:  119/88 (!) 127/90 (!) 125/90  Pulse: 76  83 88  Resp: 11 13 14 17   Temp:   (!) 97.5 F (36.4 C) 98.5 F (36.9 C)  TempSrc:   Oral Oral  SpO2: 100%  97% 96%  Weight:      Height:        Intake/Output Summary (Last 24 hours) at 12/28/2023 1517 Last data filed at 12/28/2023 1200 Gross per 24 hour  Intake 1690 ml  Output 2550 ml  Net -860 ml   Filed Weights   12/24/23 1949 12/25/23 0434 12/26/23 0454  Weight: 96.6 kg 96.6 kg 98.6 kg    Examination:  Awake R eye  consistently open Cta b no sounds  Sacrum was clean no rash or breakdown No LE edema no other swelling  Data Reviewed: I have personally reviewed following labs and imaging studies  CBC: No results for input(s): "WBC", "NEUTROABS", "HGB", "HCT", "MCV", "PLT" in the last 168 hours.  Basic Metabolic Panel: No results for input(s): "NA", "K", "CL", "CO2", "GLUCOSE", "BUN", "CREATININE", "CALCIUM", "MG", "PHOS" in the last 168 hours.  GFR: Estimated Creatinine Clearance: 130 mL/min (by C-G formula based on SCr of 0.69 mg/dL).  CBG: Recent Labs  Lab 12/27/23 2007 12/27/23 2324 12/28/23 0402 12/28/23 0807 12/28/23 1153  GLUCAP 103* 94 139* 125* 136*   Radiology Studies: No results found.  Scheduled Meds:  amLODipine   10 mg Per Tube Daily   artificial tears   Left Eye Q8H   Chlorhexidine  Gluconate Cloth  6 each Topical Daily   enoxaparin  (LOVENOX ) injection  50 mg Subcutaneous Daily   famotidine   20 mg Per Tube BID   feeding supplement (PROSource TF20)  60 mL Per Tube TID   free water   200 mL Per Tube Q4H   insulin  aspart  0-15 Units Subcutaneous Q4H   metoprolol  tartrate  100 mg Per Tube BID   multivitamin with minerals  1 tablet Per Tube Daily   nutrition supplement (JUVEN)  1 packet Per Tube BID BM   mouth rinse  15 mL Mouth Rinse 4 times per day   Continuous Infusions:  feeding supplement (OSMOLITE 1.5 CAL) 1,000 mL (12/28/23 0839)     LOS: 62 days   Time spent:  Jai-Gurmukh Caulder Wehner,  [facility]  If 7PM-7AM, please contact night-coverage [email or web address]  12/28/2023, 3:17 PM

## 2023-12-29 ENCOUNTER — Inpatient Hospital Stay (HOSPITAL_COMMUNITY)

## 2023-12-29 DIAGNOSIS — I613 Nontraumatic intracerebral hemorrhage in brain stem: Secondary | ICD-10-CM | POA: Diagnosis not present

## 2023-12-29 LAB — GLUCOSE, CAPILLARY
Glucose-Capillary: 106 mg/dL — ABNORMAL HIGH (ref 70–99)
Glucose-Capillary: 150 mg/dL — ABNORMAL HIGH (ref 70–99)
Glucose-Capillary: 174 mg/dL — ABNORMAL HIGH (ref 70–99)
Glucose-Capillary: 101 mg/dL — ABNORMAL HIGH (ref 70–99)
Glucose-Capillary: 108 mg/dL — ABNORMAL HIGH (ref 70–99)
Glucose-Capillary: 99 mg/dL (ref 70–99)

## 2023-12-29 MED ORDER — SODIUM CHLORIDE 0.9 % IV SOLN
INTRAVENOUS | Status: DC
Start: 2023-12-29 — End: 2023-12-30

## 2023-12-29 NOTE — Progress Notes (Signed)
 PROGRESS NOTE    Garrett [name]  ZOX:[id number] DOB: [birth year] DOA: 10/27/2023 PCP: Pcp, No   Brief Narrative:  Patient is a 48 year old male with no known medical history.  Patient found down on 10/27/2023 unresponsive with UDS positive for opiates, minimal responsiveness to Narcan .  Found to be markedly hypertensive at 260/156.  CT head at intake shows 4.1 cm intracranial hemorrhage at the pons with intraventricular extension into the fourth ventricle and basal cisterns.  Initially admitted by critical care, now extubated to tracheostomy which was placed 3/22.  PEG placed 3/24.  Patient continues to be medically stable for discharge at this time, he does carry high risk for complications in the setting of his need for total care.  Questionable aspiration pneumonia noted during hospitalization now status posttreatment.  Disposition remains difficult given patient's needs and family understands his poor prognosis, as such patient was made DNR per discussion with family as they would not want any further aggressive or heroic measures to be taken.  Patient remains medically stable for discharge patient's disability should be updated in June/July which should assist with placement.    Major events: 3/8: Intubated in ED, pontine bleed/SAH. CT head 17:09 Acute large hemorrhage 4.1 cm in pons with intraventricular extension without hydrocephalus 3/9: CTA 03:14 AM No change in IPH in pons, similar biparietal [facility] 3/14: Now awake, appears locked in can follow commands with eyes. 3/16 Purposeful with left upper extremity  3/22 tracheostomy placed at bedside, bleeding issues overnight from trach site 3/24 PEG (Dr. Hildy Lowers) 3/24 sputum culture with MRSA 3/27 vomited. TF on hold. Abd film c/w ileus. Added reglan , got SSE. Did tolerate PSV all day  3/28 BMs x2 after SSE day prior. Added back TFs at 1/2 rated. Tolerated PS 3/29 Tolerated PS  3/31 Tolerating CPAP PS 15/5, TF on hold due to ileus. 4/1:  con't to hold tube feeds today, restarting vancomycin  and sending tracheal aspirate as peaks are 37, plat 24, driving 19. Thick secretions. Fever overnight.  4/2: peaks improved. Ongoing hiccups. Trickle feeding. Neuro exam unchanged.  4/20: trach was changed to cuffless #8 5/1: Mental status appears to be somewhat improving off narcotics, more awake alert following simple commands(able to grimace, move left toes, track vertically with right eye).  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 Catastrophic injury Repeat CT head 3/9,3/18 and 3/29 with decreased size Echo EF 60-65% A1c 4.7 LDL 96 Narcotics and modafanil held--seems more coherent overall--working intermittently with SLP goal BP below 160--labetalol  and hydralazine  IV   Acute respiratory failure  In the setting of large pontine stroke/brainstem compression Unable to protect airway Trach placed 11/10/23 changed to cuffless 4/21 Pulmonology following - 5/1: Shiley cuffless #6 XLT proximal   Aspiration pneumonia, multifactorial, resolving Complicated by recently placed trach (11/10/23) Dysphagia in the setting of CVA and ongoing dysphagia  SLP continues to follow as above Completed vancomycin /linezolid  12/04/2023  Moderate to severe protein caloric  malnutrition, POA PEG came on  5/10--FOle yin place NPO HOld oral meds--IVF 50 cc/h   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 Recommendations from ophthalmology 4/7-(4/7): maxitrol ointment TID x1 week followed by Refresh PM TID-QID for chronic lubrication; can tape eyelid closed if exposure is still an issue   AKI earlier in hospital stay with acute urinary retention Foley replaced 4/13 will need to continue; prior removals/voiding trial unsuccessful Free water  now 200 q4h given minimally downtrending sodium, continue to follow, labs peridoically   Macrocytic anemia thrombocytosis Overall stable/resolved Perineal skin injury, POA - Continue Gerhard's Butt cream--no wounds on exam when skin examined with Nursing on 5/9   DVT prophylaxis: SCDs Start: 10/27/23 2226 Code Status:   Code Status: Do not attempt resuscitation (DNR) PRE-ARREST INTERVENTIONS DESIRED Family Communication  No family today  Status is: Inpatient  Dispo: The patient is from: Home              Anticipated d/c is to: Facility              Anticipated d/c date is: Imminent              Patient currently is medically stable for discharge  Consultants:  PCCM, palliative care, general surgery, ophthalmology  Antimicrobials:  None currently  Subjective:  Seen after peg came out No distress Area and site seem clean and occluded  Objective: Vitals:   12/29/23 1051 12/29/23 1214 12/29/23 1339 12/29/23 1515  BP: (!) 160/107 (!) 149/105 (!) 122/94 (!) 124/94  Pulse: 89 88 85 84  Resp: 18 18 14 15   Temp:  98.1 F (36.7 C)  98.5 F (36.9 C)  TempSrc:  Axillary  Axillary  SpO2: 98% 98% 91% 95%  Weight:      Height:        Intake/Output Summary (Last 24 hours) at 12/29/2023 1632 Last data filed at 12/29/2023 0600 Gross per 24 hour  Intake 1233 ml  Output 1950 ml  Net -717 ml   Filed Weights   12/24/23 1949 12/25/23 0434 12/26/23 0454  Weight: 96.6 kg 96.6 kg 98.6 kg    Examination:  Awake R eye consistently open Cta b no  sounds  Abd soft, PEG site clean and well occluded Sacrum was clean no rash or breakdown No LE edema no other swelling  Data Reviewed: I have personally reviewed following labs and imaging studies  CBC: No results for input(s): "WBC", "NEUTROABS", "HGB", "HCT", "MCV", "PLT" in the last 168 hours.  Basic Metabolic Panel: No results for input(s): "NA", "K", "CL", "CO2", "GLUCOSE", "BUN", "CREATININE", "CALCIUM", "MG", "PHOS" in the last 168 hours.  GFR: Estimated Creatinine Clearance: 130 mL/min (by C-G formula based on SCr of 0.69 mg/dL).  CBG: Recent Labs  Lab 12/28/23 2317 12/29/23 0330 12/29/23 0744 12/29/23 1213 12/29/23 1542  GLUCAP 126* 106* 150* 174* 101*   Radiology Studies: No results found.  Scheduled Meds:  amLODipine   10 mg Per Tube Daily   artificial tears   Left Eye Q8H   Chlorhexidine  Gluconate Cloth  6 each Topical Daily   enoxaparin  (LOVENOX ) injection  50 mg Subcutaneous Daily   famotidine   20 mg Per Tube BID   feeding supplement (PROSource TF20)  60 mL Per Tube TID   free water   200 mL Per Tube Q4H   insulin  aspart  0-15 Units Subcutaneous Q4H   metoprolol  tartrate  100 mg Per Tube BID   multivitamin with minerals  1 tablet Per Tube Daily   nutrition  supplement (JUVEN)  1 packet Per Tube BID BM   mouth rinse  15 mL Mouth Rinse 4 times per day   Continuous Infusions:  sodium chloride  50 mL/hr at 12/29/23 1521   feeding supplement (OSMOLITE 1.5 CAL) Stopped (12/29/23 1516)     LOS: 63 days   Time spent: 15 min  Jai-Gurmukh Arnaldo Heffron,  [facility]  If 7PM-7AM, please contact night-coverage [email or web address]  12/29/2023, 4:32 PM

## 2023-12-29 NOTE — Plan of Care (Signed)
  Problem: Intracerebral Hemorrhage Tissue Perfusion: Goal: Complications of Intracerebral Hemorrhage will be minimized Outcome: Progressing   Problem: Coping: Goal: Will verbalize positive feelings about self Outcome: Progressing   Problem: Nutrition: Goal: Risk of aspiration will decrease Outcome: Progressing   Problem: Skin Integrity: Goal: Risk for impaired skin integrity will decrease Outcome: Progressing   Problem: Tissue Perfusion: Goal: Adequacy of tissue perfusion will improve Outcome: Progressing

## 2023-12-29 NOTE — Progress Notes (Signed)
   12/29/23 1500  Provider Notification  Provider Name/Title Samtani MD  Date Provider Notified 12/29/23  Time Provider Notified 1500  Method of Notification Call  Notification Reason Other (Comment) (PEG tube dislogdment)  Provider response En route;See new orders  Date of Provider Response 12/29/23  Time of Provider Response 1501   Patient PEG tube unintentionally dislodged during bowel incontinence clean up by this RN and tech. Charge RN Seena Dadds MD notified. Foley catheter in place with bulb inflated. New split gauze dressing in place is clean, dry, and intact. IR contacted for possible procedure opening on 5/12. See new orders by [facility] MD.

## 2023-12-30 ENCOUNTER — Inpatient Hospital Stay (HOSPITAL_COMMUNITY)

## 2023-12-30 DIAGNOSIS — I613 Nontraumatic intracerebral hemorrhage in brain stem: Secondary | ICD-10-CM | POA: Diagnosis not present

## 2023-12-30 HISTORY — PX: IR REPLACE G-TUBE SIMPLE WO FLUORO: IMG2323

## 2023-12-30 LAB — BASIC METABOLIC PANEL WITH GFR
Anion gap: 11 (ref 5–15)
BUN: 21 mg/dL — ABNORMAL HIGH (ref 6–20)
CO2: 25 mmol/L (ref 22–32)
Calcium: 9.6 mg/dL (ref 8.9–10.3)
Chloride: 101 mmol/L (ref 98–111)
Creatinine, Ser: 0.67 mg/dL (ref 0.61–1.24)
GFR, Estimated: >60 mL/min (ref >60)
Glucose, Bld: 103 mg/dL — ABNORMAL HIGH (ref 70–99)
Potassium: 4.3 mmol/L (ref 3.5–5.1)
Sodium: 137 mmol/L (ref 135–145)

## 2023-12-30 LAB — GLUCOSE, CAPILLARY
Glucose-Capillary: 100 mg/dL — ABNORMAL HIGH (ref 70–99)
Glucose-Capillary: 111 mg/dL — ABNORMAL HIGH (ref 70–99)
Glucose-Capillary: 107 mg/dL — ABNORMAL HIGH (ref 70–99)
Glucose-Capillary: 100 mg/dL — ABNORMAL HIGH (ref 70–99)
Glucose-Capillary: 87 mg/dL (ref 70–99)
Glucose-Capillary: 108 mg/dL — ABNORMAL HIGH (ref 70–99)

## 2023-12-30 MED ORDER — DIATRIZOATE MEGLUMINE & SODIUM 66-10 % PO SOLN
30.0000 mL | Freq: Once | ORAL | Status: AC
  Administered 2023-12-30: 30 mL
  Filled 2023-12-30: qty 30

## 2023-12-30 MED ORDER — OSMOLITE 1.2 CAL PO LIQD
1000.0000 mL | ORAL | Status: DC
  Administered 2023-12-30: 1000 mL

## 2023-12-30 NOTE — Procedures (Signed)
 PROCEDURE SUMMARY:  Successful exchange of gastrostomy tube. 18Fr balloon retention now in place and ready to use.  No complications.   EBL = trace  Please see full dictation in imaging section of Epic for procedure details.   Akim Watkinson Alonzo Artis PA-C 12/30/2023 2:54 PM

## 2023-12-30 NOTE — Progress Notes (Signed)
 PROGRESS NOTE    Garrett [name]  MWU:[id number] DOB: 1976/07/31 DOA: 10/27/2023 PCP: Pcp, No   Brief Narrative:  Patient is a 48 year old male with no known medical history.  Patient found down on 10/27/2023 unresponsive with UDS positive for opiates, minimal responsiveness to Narcan .  Found to be markedly hypertensive at 260/156.  CT head at intake shows 4.1 cm intracranial hemorrhage at the pons with intraventricular extension into the fourth ventricle and basal cisterns.  Initially admitted by critical care, now extubated to tracheostomy which was placed 3/22.  PEG placed 3/24.  Patient continues to be medically stable for discharge at this time, he does carry high risk for complications in the setting of his need for total care.  Questionable aspiration pneumonia noted during hospitalization now status posttreatment.  Disposition remains difficult given patient's needs and family understands his poor prognosis, as such patient was made DNR per discussion with family as they would not want any further aggressive or heroic measures to be taken.  Patient remains medically stable for discharge patient's disability should be updated in June/July which should assist with placement.    Major events: 3/8: Intubated in ED, pontine bleed/SAH. CT head 17:09 Acute large hemorrhage 4.1 cm in pons with intraventricular extension without hydrocephalus 3/9: CTA 03:14 AM No change in IPH in pons, similar biparietal [facility] 3/14: Now awake, appears locked in can follow commands with eyes. 3/16 Purposeful with left upper extremity  3/22 tracheostomy placed at bedside, bleeding issues overnight from trach site 3/24 PEG (Dr. Hildy Lowers) 3/24 sputum culture with MRSA 3/27 vomited. TF on hold. Abd film c/w ileus. Added reglan , got SSE. Did tolerate PSV all day  3/28 BMs x2 after SSE day prior. Added back TFs at 1/2 rated. Tolerated PS 3/29 Tolerated PS  3/31 Tolerating CPAP PS 15/5, TF on hold due to ileus. 4/1:  con't to hold tube feeds today, restarting vancomycin  and sending tracheal aspirate as peaks are 37, plat 24, driving 19. Thick secretions. Fever overnight.  4/2: peaks improved. Ongoing hiccups. Trickle feeding. Neuro exam unchanged.  4/20: trach was changed to cuffless #8 5/1: Mental status appears to be somewhat improving off narcotics, more awake alert following simple commands(able to grimace, move left toes, track vertically with right eye).  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 Catastrophic injury Repeat CT head 3/9,3/18 and 3/29 with decreased size Echo EF 60-65% A1c 4.7 LDL 96 Narcotics and modafanil held--seems more coherent overall--working intermittently with SLP goal BP below 160--labetalol  and hydralazine  IV   Acute respiratory failure  In the setting of large pontine stroke/brainstem compression Unable to protect airway Trach placed 11/10/23 changed to cuffless 4/21 Pulmonology following - 5/1: Shiley cuffless #6 XLT proximal   Aspiration pneumonia, multifactorial, resolving Complicated by recently placed trach (11/10/23) Dysphagia in the setting of CVA and ongoing dysphagia  SLP continues to follow as above Completed vancomycin /linezolid  12/04/2023  Moderate to severe protein caloric  malnutrition, POA PEG came on  5/10--Foley in place NPO Hold oral meds--IVF 50 cc/h now held PEG tube was replaced on 5/11 awaiting confirmatory x-ray and then start back feeds and meds per protocol   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 Recommendations from ophthalmology 4/7-(4/7): maxitrol ointment TID x1 week followed by Refresh PM TID-QID for chronic lubrication; can tape eyelid closed if exposure is still an issue   AKI earlier in hospital stay with acute urinary retention Foley replaced 4/13 will need to continue; prior removals/voiding trial unsuccessful Free water  now 200 q4h given minimally downtrending sodium, continue to follow, labs peridoically   Macrocytic anemia thrombocytosis Overall stable/resolved Perineal skin injury, POA - Continue Gerhard's Butt cream--no wounds on exam when skin examined with Nursing on 5/9   DVT prophylaxis: SCDs Start: 10/27/23 2226 Code Status:   Code Status: Do not attempt resuscitation (DNR) PRE-ARREST INTERVENTIONS DESIRED Family Communication  No family today  Status is: Inpatient  Dispo: The patient is from: Home              Anticipated d/c is to: Facility              Anticipated d/c date is: Imminent              Patient currently is medically stable for discharge  Consultants:  PCCM, palliative care, general surgery, ophthalmology  Antimicrobials:  None currently  Subjective:  Looks fair so before PEG was replaced No chest pain no fever No nausea more interactive overall  Objective: Vitals:   12/30/23 1210 12/30/23 1257 12/30/23 1325 12/30/23 1505  BP: (!) 139/109 (!) 131/98 (!) 143/100 (!) 130/99  Pulse: 92 94 89 92  Resp: 13 15 14 18   Temp: 98.9 F (37.2 C)  98.3 F (36.8 C) 98.8 F (37.1 C)  TempSrc: Axillary  Axillary Axillary  SpO2: 97%  97% 98%  Weight:      Height:        Intake/Output Summary (Last 24 hours) at 12/30/2023 1604 Last data filed at 12/30/2023 1257 Gross per 24 hour  Intake 1359.93 ml  Output 1650 ml  Net -290.07  ml   Filed Weights   12/24/23 1949 12/25/23 0434 12/26/23 0454  Weight: 96.6 kg 96.6 kg 98.6 kg    Examination:  Awake R eye consistently open, L eye taped Cta b no sounds  Abd soft, PEG site clean and well occluded--seen before PEG replaced No LE edema no other swelling  Data Reviewed: I have personally reviewed following labs and imaging studies  CBC: No results for input(s): "WBC", "NEUTROABS", "HGB", "HCT", "MCV", "PLT" in the last 168 hours.  Basic Metabolic Panel: Recent Labs  Lab 12/30/23 0539  NA 137  K 4.3  CL 101  CO2 25  GLUCOSE 103*  BUN 21*  CREATININE 0.67  CALCIUM 9.6    GFR: Estimated Creatinine Clearance: 130 mL/min (by C-G formula based on SCr of 0.67 mg/dL).  CBG: Recent Labs  Lab 12/29/23 2305 12/30/23 0319 12/30/23 0742 12/30/23 1206 12/30/23 1506  GLUCAP 99 100* 111* 107* 100*   Radiology Studies: CT ABDOMEN PELVIS WO CONTRAST Result Date: 12/30/2023 CLINICAL DATA:  Intracranial hemorrhage, respiratory failure and assessment of gastrostomy tube. Original gastrostomy tube placed by general surgery on 11/12/2023. This tube was inadvertently dislodged and replaced by a Foley catheter. EXAM: CT ABDOMEN AND PELVIS WITHOUT CONTRAST TECHNIQUE: Multidetector CT imaging of the abdomen and pelvis was performed following the standard protocol without IV contrast. RADIATION DOSE REDUCTION: This  exam was performed according to the departmental dose-optimization program which includes automated exposure control, adjustment of the mA and/or kV according to patient size and/or use of iterative reconstruction technique. COMPARISON:  Abdominal x-ray on 11/15/2023 FINDINGS: Lower chest: No acute abnormality. Hepatobiliary: No focal liver abnormality is seen. No gallstones, gallbladder wall thickening, or biliary dilatation. Pancreas: Unremarkable. No pancreatic ductal dilatation or surrounding inflammatory changes. Spleen: Normal in size without focal abnormality.  Adrenals/Urinary Tract: Adrenal glands are unremarkable. Kidneys are normal, without renal calculi, focal lesion, or hydronephrosis. Bladder contains a Foley catheter. Stomach/Bowel: The Foley catheter enters the stomach through the gastrostomy tract and demonstrates redundancy within the stomach and is coiled in the body of the stomach. The retention balloon is visible but it is may not be optimally inflated. Bowel shows no evidence of obstruction, ileus, inflammation or lesion. The appendix is normal. No free intraperitoneal air. Vascular/Lymphatic: Mild atherosclerosis of the abdominal aorta without aneurysm. No enlarged abdominal or pelvic lymph nodes. Reproductive: Prostate is unremarkable. Other: No abdominal wall hernia or abnormality. No abdominopelvic ascites. Musculoskeletal: No acute or significant osseous findings. IMPRESSION: 1. The Foley catheter enters the stomach through the gastrostomy tract and demonstrates redundancy within the stomach and is coiled in the body of the stomach. The retention balloon is visible but it is may not be optimally inflated. 2. Mild atherosclerosis of the abdominal aorta without aneurysm. Electronically Signed   By: Erica Hau M.D.   On: 12/30/2023 09:00    Scheduled Meds:  artificial tears   Left Eye Q8H   Chlorhexidine  Gluconate Cloth  6 each Topical Daily   enoxaparin  (LOVENOX ) injection  50 mg Subcutaneous Daily   famotidine   20 mg Per Tube BID   feeding supplement (PROSource TF20)  60 mL Per Tube TID   free water   200 mL Per Tube Q4H   insulin  aspart  0-15 Units Subcutaneous Q4H   metoprolol  tartrate  100 mg Per Tube BID   multivitamin with minerals  1 tablet Per Tube Daily   nutrition supplement (JUVEN)  1 packet Per Tube BID BM   mouth rinse  15 mL Mouth Rinse 4 times per day   Continuous Infusions:  feeding supplement (OSMOLITE 1.5 CAL) Stopped (12/29/23 1516)     LOS: 64 days   Time spent: 15 min  Jai-Gurmukh Laurier Jasperson,  [facility]  If 7PM-7AM, please contact night-coverage [email or web address]  12/30/2023, 4:04 PM

## 2023-12-30 NOTE — Plan of Care (Signed)
  Problem: Health Behavior/Discharge Planning: Goal: Ability to manage health-related needs will improve Outcome: Progressing   Problem: Skin Integrity: Goal: Risk for impaired skin integrity will decrease Outcome: Progressing   Problem: Tissue Perfusion: Goal: Adequacy of tissue perfusion will improve Outcome: Progressing   Problem: Nutrition: Goal: Dietary intake will improve Outcome: Not Progressing  Peg Tube to be replaced. IV fluids infusing.

## 2023-12-31 ENCOUNTER — Inpatient Hospital Stay (HOSPITAL_COMMUNITY)

## 2023-12-31 DIAGNOSIS — J9601 Acute respiratory failure with hypoxia: Secondary | ICD-10-CM | POA: Diagnosis not present

## 2023-12-31 DIAGNOSIS — I613 Nontraumatic intracerebral hemorrhage in brain stem: Secondary | ICD-10-CM | POA: Diagnosis not present

## 2023-12-31 LAB — GLUCOSE, CAPILLARY
Glucose-Capillary: 106 mg/dL — ABNORMAL HIGH (ref 70–99)
Glucose-Capillary: 121 mg/dL — ABNORMAL HIGH (ref 70–99)
Glucose-Capillary: 120 mg/dL — ABNORMAL HIGH (ref 70–99)
Glucose-Capillary: 142 mg/dL — ABNORMAL HIGH (ref 70–99)
Glucose-Capillary: 152 mg/dL — ABNORMAL HIGH (ref 70–99)
Glucose-Capillary: 142 mg/dL — ABNORMAL HIGH (ref 70–99)

## 2023-12-31 MED ORDER — AMLODIPINE BESYLATE 10 MG PO TABS
10.0000 mg | ORAL_TABLET | Freq: Every day | ORAL | Status: DC
  Administered 2023-12-31 – 2024-01-04 (×5): 10 mg via ORAL
  Filled 2023-12-31 (×6): qty 1

## 2023-12-31 NOTE — Progress Notes (Signed)
 NAME:  Garrett [name], MRN:  [id number], DOB:  Nov 18, 1975, LOS: 65 ADMISSION DATE:  10/27/2023, CONSULTATION DATE:  10/27/2023 REFERRING MD:  Florentino Hurdle, DO CHIEF COMPLAINT:  Found Down  History of Present Illness:  48 y/o male with past medical history of hypertension who was found down for unknown downtime by his fiance when she came home from work. Reportedly, having agonal breathing.  He apparently had driven her to work this morning.  He has HTN but never checks his BP and does not follow with MD.  She says you can tell when his BP is really high; his eyes get blood shot and he is sweating.  No h/o seizures.  Besides almost daily [name] and cigarette smoking, she is not aware of any other drugs. Drug screen positive for opioids and he was given several doses of Narcan . Patient found to have:  1. 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 Basal cisterns and fourth ventricle are effaced without hydrocephalus at this time. 2. Trace additional subarachnoid hemorrhage along the left parietal convexity. BP in ED 262/156. His CXR revealed a RUL and perihilar infiltrates suggesting aspiration pneumonia. ED labs also revealed PH 7.169, LA 4.4, CPK 985. Patient was intubated in ED.  Pertinent  Medical History  hypertension  Significant Hospital Events: Including procedures, antibiotic start and stop dates in addition to other pertinent events   3/8: Intubated in ED, pontine bleed/SAH. CT head 17:09 Acute large hemorrhage 4.1 cm in pons with intraventricular extension without hydrocephalus 3/9: CTA 03:14 AM No change in IPH in pons, similar biparietal [facility] 3/14: Now awake, appears locked in can follow commands with eyes. 3/16 Purposeful with left upper extremity  3/22 tracheostomy placed at bedside, bleeding issues overnight from trach site 3/24 PEG (Dr. Hildy Lowers) 3/24  sputum culture with MRSA 3/27 vomited. TF on hold. Abd film c/w ileus. Added reglan , got SSE. Did tolerate PSV all day  3/28 BMs x2 after SSE day prior. Added back TFs at 1/2 rated. Tolerated PS 3/29 Tolerated PS  3/31 Tolerating CPAP PS 15/5, TF on hold due to ileus. 4/1: con't to hold tube feeds today, restarting vancomycin  and sending tracheal aspirate as peaks are 37, plat 24, driving 19. Thick secretions. Fever overnight.  4/2: peaks improved. Ongoing hiccups. Trickle feeding. Neuro exam unchanged.  4/20: trach was changed to cuffless #8 4/28: not safe to swallow. Limited communication and he follows simple commands. On TF 5/1: on TC 28%, with large secretions. Working with speech and he is improving to initiate PMV trials under ST supervision   5/5: on TC 30%, 7 L/min. Trach #6 cuffless, changed 5/1 to facilitate PMV trials  5/12: on TC 21%, 6 L/min. Trach #6 cuffless, unable to produce sounds or speak on PMV. No significant resp secretions. On PEG TF  Interim History / Subjective:   Has been doing well on trach collar  Objective   Blood pressure (!) 132/97, pulse (!) 103, temperature 98.4 F (36.9 C), temperature source Axillary, resp. rate 20, height 5\' 8"  (1.727 m), weight 97.5 kg, SpO2 97%.    FiO2 (%):  [21 %] 21 %   Intake/Output Summary (Last 24 hours) at 12/31/2023 1104 Last data filed at 12/31/2023 0919 Gross per 24 hour  Intake --  Output 1275 ml  Net -1275 ml   Filed Weights   12/25/23 0434 12/26/23 0454 12/31/23 0500  Weight: 96.6 kg 98.6  kg 97.5 kg   Physical Exam: General: middle aged male, lying in bed, no distress minimally responsive. On TC 21% HEENT: Tracheostomy in place. Minimal secretions. Chest: Clear bilaterally CV: HS normal  Abdomen: PEG in place.  Extremities: No edema. Neuro: eyes open, tracks. Trying to squeeze hands on commands.   Assessment & Plan:  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  Acute hypoxic respiratory  failure s/p tracheostomy MRSA bronchitis- treated Dysphagia - Status post PEG placement.   - PMV and swallow eval per ST.  - Continue Shiley cuffless #6 XLT proximal cuffless   - Continue trach collar - PRN duonebs and suctioning - No plan for downsizing, capping or decannulation   Madelynn Schilder, M.D. [facility] Pulmonary/Critical Care Medicine  See Amion for personal pager PCCM on call pager [phone] until 7pm. Please call Elink 7p-7a. [phone]

## 2023-12-31 NOTE — Plan of Care (Signed)
   Problem: Coping: Goal: Level of anxiety will decrease Outcome: Progressing   Problem: Pain Managment: Goal: General experience of comfort will improve and/or be controlled Outcome: Progressing   Problem: Safety: Goal: Ability to remain free from injury will improve Outcome: Progressing

## 2023-12-31 NOTE — Progress Notes (Signed)
 Physical Therapy Treatment Patient Details Name: Garrett [name] MRN: [id number] DOB: February 16, 1976 Today's Date: 12/31/2023   History of Present Illness Pt is a 48 yo male who was found down 10/27/23 with agonal breathing. 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 Intubated 3/8, cortrak placed 3/14, trach placed 3/22, PEG placed 3/24. PMH: HTN, smoker    PT Comments  The pt was agreeable to session with focus on bed level mobility for lift placement and cleaning, then lifting OOB to chair for improved upright positioning. Pt tolerated well, but needs assist of 2 to assist with rolling and manage safe positioning of head/neck during lift transfer. Once situated in chair, pt able to complete ROM exercises with LUE using small ball to work on composite extension and flexion of fingers, and ROM movement for L toes. Pt also able to consistently use yes/no communication board with visual scanning to communicate desires. Will continue to follow acutely to progress functional activity tolerance and ability to participate in sessions.     If plan is discharge home, recommend the following: Two people to help with walking and/or transfers;Two people to help with bathing/dressing/bathroom;Assistance with cooking/housework;Assistance with feeding;Direct supervision/assist for medications management;Direct supervision/assist for financial management;Assist for transportation;Help with stairs or ramp for entrance;Supervision due to cognitive status   Can travel by private vehicle        Equipment Recommendations  [place] lift;Hospital bed;Wheelchair (measurements PT);Wheelchair cushion (measurements PT);BSC/3in1;Other (comment) (air mattress)    Recommendations for Other Services       Precautions / Restrictions Precautions Precautions:  Fall;Other (comment) Recall of Precautions/Restrictions: Impaired Precaution/Restrictions Comments: trach, PEG, abdominal binder, SBP < 160 Required Braces or Orthoses: Other Brace Splint/Cast: Bil resting hand splints Splint/Cast - Date Prophylactic Dressing Applied (if applicable): 11/02/23 Other Brace: has bilateral prevalon boots Restrictions Weight Bearing Restrictions Per Provider Order: No     Mobility  Bed Mobility Overal bed mobility: Needs Assistance Bed Mobility: Rolling Rolling: Total assist, +2 for safety/equipment, +2 for physical assistance         General bed mobility comments: TotalA for rolling to R/L for placement of maxi sky lift pad    Transfers Overall transfer level: Needs assistance Equipment used: Ambulation equipment used Transfers: Bed to chair/wheelchair/BSC             General transfer comment: Maximove bed to chair Transfer via Lift Equipment: Maximove  Ambulation/Gait                   Stairs             Wheelchair Mobility     Tilt Bed    Modified Rankin (Stroke Patients Only)       Balance Overall balance assessment: Needs assistance Sitting-balance support: No upper extremity supported, Feet supported Sitting balance-Leahy Scale: Zero Sitting balance - Comments: dependent on trunk and head suppor Postural control: Posterior lean                                  Communication Communication Communication: Impaired Factors Affecting Communication: Trach/intubated  Cognition Arousal: Alert Behavior During Therapy: Flat affect   PT - Cognitive impairments: Difficult to assess                       PT -  Cognition Comments: following commands with LUE with 3-5 sec delay and L toes. used yes/no communication board by looking with his eyes consistently Following commands: Impaired Following commands impaired: Follows one step commands with increased time    Cueing Cueing Techniques:  Visual cues  Exercises Other Exercises Other Exercises: pt holding small orange ball in LUE. with PT facilitating pronation andsupination he was able to open his hand to recieve ball in supinated position, then close fingers around ball, maintain his grip as PT facilitated pronation, and then release when cued. completed x10 toimprove composite finger extension and flexion Other Exercises: LLE AAROM at ankle, PROM at knee Other Exercises: use ofyes/no communication board to position pt and manage TV channel preference    General Comments General comments (skin integrity, edema, etc.): VSS      Pertinent Vitals/Pain Pain Assessment Pain Assessment: No/denies pain Pain Score: 0-No pain Faces Pain Scale: No hurt Breathing: normal Negative Vocalization: none Facial Expression: smiling or inexpressive Body Language: relaxed Consolability: no need to console     PT Goals (current goals can now be found in the care plan section) Acute Rehab PT Goals Patient Stated Goal: unable to state PT Goal Formulation: With patient Time For Goal Achievement: 01/14/24 Potential to Achieve Goals: Fair Progress towards PT goals: Progressing toward goals (slowly)    Frequency    Min 2X/week       AM-PAC PT "6 Clicks" Mobility   Outcome Measure  Help needed turning from your back to your side while in a flat bed without using bedrails?: Total Help needed moving from lying on your back to sitting on the side of a flat bed without using bedrails?: Total Help needed moving to and from a bed to a chair (including a wheelchair)?: Total Help needed standing up from a chair using your arms (e.g., wheelchair or bedside chair)?: Total Help needed to walk in hospital room?: Total Help needed climbing 3-5 steps with a railing? : Total 6 Click Score: 6    End of Session Equipment Utilized During Treatment: Oxygen Activity Tolerance: Patient tolerated treatment well Patient left: in chair;with call  bell/phone within reach;with chair alarm set Nurse Communication: Mobility status;Need for lift equipment PT Visit Diagnosis: Muscle weakness (generalized) (M62.81);Difficulty in walking, not elsewhere classified (R26.2);Other symptoms and signs involving the nervous system (R29.898);Hemiplegia and hemiparesis Hemiplegia - Right/Left: Right Hemiplegia - dominant/non-dominant: Dominant Hemiplegia - caused by: Nontraumatic intracerebral hemorrhage     Time: 1126-1206 PT Time Calculation (min) (ACUTE ONLY): 40 min  Charges:    $Therapeutic Exercise: 8-22 mins $Therapeutic Activity: 23-37 mins PT General Charges $$ ACUTE PT VISIT: 1 Visit                     Barnabas Booth, PT, DPT   Acute Rehabilitation Department Office [phone] Secure Chat Communication Preferred   Lona Rist 12/31/2023, 1:04 PM

## 2023-12-31 NOTE — Progress Notes (Signed)
 PROGRESS NOTE    Garrett [name]  ZOX:[id number] DOB: 25-Apr-1976 DOA: 10/27/2023 PCP: Pcp, No   Brief Narrative:  Patient is a 48 year old male with no known medical history.  Patient found down on 10/27/2023 unresponsive with UDS positive for opiates, minimal responsiveness to Narcan .  Found to be markedly hypertensive at 260/156.  CT head at intake shows 4.1 cm intracranial hemorrhage at the pons with intraventricular extension into the fourth ventricle and basal cisterns.  Initially admitted by critical care, now extubated to tracheostomy which was placed 3/22.  PEG placed 3/24.  Patient continues to be medically stable for discharge at this time, he does carry high risk for complications in the setting of his need for total care.  Questionable aspiration pneumonia noted during hospitalization now status posttreatment.  Disposition remains difficult given patient's needs and family understands his poor prognosis, as such patient was made DNR per discussion with family as they would not want any further aggressive or heroic measures to be taken.  Patient remains medically stable for discharge patient's disability should be updated in June/July which should assist with placement.    Major events: 3/8: Intubated in ED, pontine bleed/SAH. CT head 17:09 Acute large hemorrhage 4.1 cm in pons with intraventricular extension without hydrocephalus 3/9: CTA 03:14 AM No change in IPH in pons, similar biparietal [facility] 3/14: Now awake, appears locked in can follow commands with eyes. 3/16 Purposeful with left upper extremity  3/22 tracheostomy placed at bedside, bleeding issues overnight from trach site 3/24 PEG (Dr. Hildy Lowers) 3/24 sputum culture with MRSA 3/27 vomited. TF on hold. Abd film c/w ileus. Added reglan , got SSE. Did tolerate PSV all day  3/28 BMs x2 after SSE day prior. Added back TFs at 1/2 rated. Tolerated PS 3/29 Tolerated PS  3/31 Tolerating CPAP PS 15/5, TF on hold due to ileus. 4/1:  con't to hold tube feeds today, restarting vancomycin  and sending tracheal aspirate as peaks are 37, plat 24, driving 19. Thick secretions. Fever overnight.  4/2: peaks improved. Ongoing hiccups. Trickle feeding. Neuro exam unchanged.  4/20: trach was changed to cuffless #8 5/1: Mental status appears to be somewhat improving off narcotics, more awake alert following simple commands(able to grimace, move left toes, track vertically with right eye).  Speech following to evaluate for possible communication options downsize to Shiley cuffless #6 XLT proximal  5/4 -held modafinil , low threshold to resume if mental status/ability to interact diminishes 5/10 PEg dislodged, foley bulb placed and IR consulted to replace tube 5/11 IR replaced PEG tube with 18 French balloon under tension and it was ready to use  Assessment & Plan:   Principal Problem:   ICH (intracerebral hemorrhage) (HCC) Active Problems:   Intracranial hemorrhage (HCC)   On mechanically assisted ventilation (HCC)   Advanced care planning/counseling discussion   Tracheostomy dependence (HCC)   Pressure injury of skin  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-secondary to hypertensive emergency Catastrophic injury Repeat CT head 3/9,3/18 and 3/29 with decreased size Echo EF 60-65% A1c 4.7 LDL 96 Narcotics and modafanil held--seems more coherent overall--working intermittently with SLP goal BP below 160--labetalol  and hydralazine  IV-start back amlodipine  10, metoprolol  100 twice daily   Acute respiratory failure  In the setting of large pontine stroke/brainstem compression Unable to protect airway Trach placed 11/10/23 changed to cuffless 4/21 Pulmonology following - 5/1: Shiley cuffless #6 XLT proximal-patient was last seen by pulmonary on 5/12 and likely given difficulty with phonation  and inability to protect airway is probably not a candidate for  decannulation  Aspiration pneumonia, multifactorial, resolving Complicated by recently placed trach (11/10/23) Dysphagia in the setting of CVA and ongoing dysphagia  SLP continues to follow as above Completed vancomycin /linezolid  12/04/2023  Moderate to severe protein caloric malnutrition, POA PEG came out 5/10--replaced on 5/11 and feeds resumed Resuming p.o. 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   Macrocytic anemia thrombocytosis Overall stable/resolved Perineal skin injury, POA - Continue Gerhard's Butt cream--no wounds on exam when skin examined with Nursing on 5/9   DVT prophylaxis: SCDs Start: 10/27/23 2226 Code Status:   Code Status: Do not attempt resuscitation (DNR) PRE-ARREST INTERVENTIONS DESIRED Family Communication  No family today  Status is: Inpatient  Dispo: The patient is from: Home              Anticipated d/c is to: Facility              Anticipated d/c date is: Imminent              Patient currently is medically stable for discharge  Consultants:  PCCM, palliative care, general surgery, ophthalmology  Antimicrobials:  None currently  Subjective:  Sitting out in chair Was able to consistently use yes no communication board and visually scan to communicate desires requiring max assistance  Objective: Vitals:   12/31/23 0847 12/31/23 1147 12/31/23 1254 12/31/23 1521  BP: (!) 132/97  (!) 147/110   Pulse: (!) 103  90 (!) 102  Resp: 20 18 18    Temp: 98.4 F (36.9 C)  98.8 F (37.1 C)   TempSrc: Axillary  Axillary   SpO2: 97% 94% 93%   Weight:      Height:         Intake/Output Summary (Last 24 hours) at 12/31/2023 1538 Last data filed at 12/31/2023 0919 Gross per 24 hour  Intake --  Output 925 ml  Net -925 ml   Filed Weights   12/25/23 0434 12/26/23 0454 12/31/23 0500  Weight: 96.6 kg 98.6 kg 97.5 kg    Examination:  Awake R eye consistently open, L eye taped Cta b no sounds  Abd soft, PEG site clean and infusing feeds No LE edema no other swelling  Data Reviewed: I have personally reviewed following labs and imaging studies  CBC: No results for input(s): "WBC", "NEUTROABS", "HGB", "HCT", "MCV", "PLT" in the last 168 hours.  Basic Metabolic Panel: Recent Labs  Lab 12/30/23 0539  NA 137  K 4.3  CL 101  CO2 25  GLUCOSE 103*  BUN 21*  CREATININE 0.67  CALCIUM 9.6    GFR: Estimated Creatinine Clearance: 129.2 mL/min (by C-G formula based on SCr of 0.67 mg/dL).  CBG: Recent Labs  Lab 12/30/23 1936 12/30/23 2312 12/31/23 0320 12/31/23 0742 12/31/23 1239  GLUCAP 87 108* 106* 121* 120*   Radiology Studies: DG ABDOMEN PEG TUBE LOCATION Result Date: 12/30/2023 CLINICAL DATA:  [id number] PEG (percutaneous endoscopic gastrostomy) adjustment/replacement/removal (HCC) 045409. Gastrografin 30 mL administered via the PEG tube EXAM: ABDOMEN - 1 VIEW COMPARISON:  None Available. FINDINGS: PO contrast was administered via the gastrostomy tube. Left upper  quadrant percutaneous gastrostomy tube noted. Partial opacification of the gastric lumen with administered PO contrast. No radiographic findings suggest extravasation of the PO contrast. The bowel gas pattern is normal. No radio-opaque calculi or other significant radiographic abnormality are seen. IMPRESSION: Percutaneous gastrostomy tube in appropriate position. Electronically Signed   By: Morgane  Naveau M.D.   On: 12/30/2023 18:34   CT ABDOMEN PELVIS WO CONTRAST Result Date: 12/30/2023 CLINICAL DATA:  Intracranial hemorrhage, respiratory failure and assessment of gastrostomy tube.  Original gastrostomy tube placed by general surgery on 11/12/2023. This tube was inadvertently dislodged and replaced by a Foley catheter. EXAM: CT ABDOMEN AND PELVIS WITHOUT CONTRAST TECHNIQUE: Multidetector CT imaging of the abdomen and pelvis was performed following the standard protocol without IV contrast. RADIATION DOSE REDUCTION: This exam was performed according to the departmental dose-optimization program which includes automated exposure control, adjustment of the mA and/or kV according to patient size and/or use of iterative reconstruction technique. COMPARISON:  Abdominal x-ray on 11/15/2023 FINDINGS: Lower chest: No acute abnormality. Hepatobiliary: No focal liver abnormality is seen. No gallstones, gallbladder wall thickening, or biliary dilatation. Pancreas: Unremarkable. No pancreatic ductal dilatation or surrounding inflammatory changes. Spleen: Normal in size without focal abnormality. Adrenals/Urinary Tract: Adrenal glands are unremarkable. Kidneys are normal, without renal calculi, focal lesion, or hydronephrosis. Bladder contains a Foley catheter. Stomach/Bowel: The Foley catheter enters the stomach through the gastrostomy tract and demonstrates redundancy within the stomach and is coiled in the body of the stomach. The retention balloon is visible but it is may not be optimally inflated. Bowel shows no evidence of obstruction, ileus, inflammation or lesion. The appendix is normal. No free intraperitoneal air. Vascular/Lymphatic: Mild atherosclerosis of the abdominal aorta without aneurysm. No enlarged abdominal or pelvic lymph nodes. Reproductive: Prostate is unremarkable. Other: No abdominal wall hernia or abnormality. No abdominopelvic ascites. Musculoskeletal: No acute or significant osseous findings. IMPRESSION: 1. The Foley catheter enters the stomach through the gastrostomy tract and demonstrates redundancy within the stomach and is coiled in the body of the stomach. The retention  balloon is visible but it is may not be optimally inflated. 2. Mild atherosclerosis of the abdominal aorta without aneurysm. Electronically Signed   By: Erica Hau M.D.   On: 12/30/2023 09:00    Scheduled Meds:  amLODipine   10 mg Oral Daily   artificial tears   Left Eye Q8H   Chlorhexidine  Gluconate Cloth  6 each Topical Daily   enoxaparin  (LOVENOX ) injection  50 mg Subcutaneous Daily   famotidine   20 mg Per Tube BID   feeding supplement (PROSource TF20)  60 mL Per Tube TID   free water   200 mL Per Tube Q4H   insulin  aspart  0-15 Units Subcutaneous Q4H   metoprolol  tartrate  100 mg Per Tube BID   multivitamin with minerals  1 tablet Per Tube Daily   nutrition supplement (JUVEN)  1 packet Per Tube BID BM   mouth rinse  15 mL Mouth Rinse 4 times per day   Continuous Infusions:  feeding supplement (OSMOLITE 1.2 CAL) 1,000 mL (12/30/23 1900)   feeding supplement (OSMOLITE 1.5 CAL) Stopped (12/29/23 1516)     LOS: 65 days   Time spent: 25 min  Jai-Gurmukh Javayah Magaw,  [facility]  If 7PM-7AM, please contact night-coverage [email or web address]  12/31/2023, 3:38 PM

## 2024-01-01 ENCOUNTER — Inpatient Hospital Stay (HOSPITAL_COMMUNITY)

## 2024-01-01 DIAGNOSIS — I613 Nontraumatic intracerebral hemorrhage in brain stem: Secondary | ICD-10-CM | POA: Diagnosis not present

## 2024-01-01 LAB — BASIC METABOLIC PANEL WITH GFR
Anion gap: 11 (ref 5–15)
BUN: 24 mg/dL — ABNORMAL HIGH (ref 6–20)
CO2: 24 mmol/L (ref 22–32)
Calcium: 9.2 mg/dL (ref 8.9–10.3)
Chloride: 100 mmol/L (ref 98–111)
Creatinine, Ser: 0.7 mg/dL (ref 0.61–1.24)
GFR, Estimated: >60 mL/min (ref >60)
Glucose, Bld: 134 mg/dL — ABNORMAL HIGH (ref 70–99)
Potassium: 3.7 mmol/L (ref 3.5–5.1)
Sodium: 135 mmol/L (ref 135–145)

## 2024-01-01 LAB — CBC WITH DIFFERENTIAL/PLATELET
Abs Immature Granulocytes: 0.14 10*3/uL — ABNORMAL HIGH (ref 0.00–0.07)
Basophils Absolute: 0.1 10*3/uL (ref 0.0–0.1)
Basophils Relative: 0 %
Eosinophils Absolute: 0 10*3/uL (ref 0.0–0.5)
Eosinophils Relative: 0 %
HCT: 37.8 % — ABNORMAL LOW (ref 39.0–52.0)
Hemoglobin: 12.1 g/dL — ABNORMAL LOW (ref 13.0–17.0)
Immature Granulocytes: 1 %
Lymphocytes Relative: 9 %
Lymphs Abs: 2 10*3/uL (ref 0.7–4.0)
MCH: 30 pg (ref 26.0–34.0)
MCHC: 32 g/dL (ref 30.0–36.0)
MCV: 93.8 fL (ref 80.0–100.0)
Monocytes Absolute: 2.3 10*3/uL — ABNORMAL HIGH (ref 0.1–1.0)
Monocytes Relative: 10 %
Neutro Abs: 18.7 10*3/uL — ABNORMAL HIGH (ref 1.7–7.7)
Neutrophils Relative %: 80 %
Platelets: 503 10*3/uL — ABNORMAL HIGH (ref 150–400)
RBC: 4.03 MIL/uL — ABNORMAL LOW (ref 4.22–5.81)
RDW: 13.9 % (ref 11.5–15.5)
WBC: 23.1 10*3/uL — ABNORMAL HIGH (ref 4.0–10.5)
nRBC: 0 % (ref 0.0–0.2)

## 2024-01-01 LAB — GLUCOSE, CAPILLARY
Glucose-Capillary: 95 mg/dL (ref 70–99)
Glucose-Capillary: 132 mg/dL — ABNORMAL HIGH (ref 70–99)
Glucose-Capillary: 143 mg/dL — ABNORMAL HIGH (ref 70–99)
Glucose-Capillary: 129 mg/dL — ABNORMAL HIGH (ref 70–99)
Glucose-Capillary: 135 mg/dL — ABNORMAL HIGH (ref 70–99)
Glucose-Capillary: 140 mg/dL — ABNORMAL HIGH (ref 70–99)

## 2024-01-01 MED ORDER — SODIUM CHLORIDE 0.9 % IV SOLN
1.0000 g | INTRAVENOUS | Status: DC
  Administered 2024-01-01 – 2024-01-02 (×2): 1 g via INTRAVENOUS
  Filled 2024-01-01 (×2): qty 10

## 2024-01-01 NOTE — Progress Notes (Signed)
 PROGRESS NOTE    Garrett [name]  UXL:[id number] DOB: 1976/04/15 DOA: 10/27/2023 PCP: Pcp, No   Brief Narrative:  Patient is a 48 year old male with no known medical history.  Patient found down on 10/27/2023 unresponsive with UDS positive for opiates, minimal responsiveness to Narcan .  Found to be markedly hypertensive at 260/156.  CT head at intake shows 4.1 cm intracranial hemorrhage at the pons with intraventricular extension into the fourth ventricle and basal cisterns.  Initially admitted by critical care, now extubated to tracheostomy which was placed 3/22.  PEG placed 3/24.  Patient continues to be medically stable for discharge at this time, he does carry high risk for complications in the setting of his need for total care.  Questionable aspiration pneumonia noted during hospitalization now status posttreatment.  Disposition remains difficult given patient's needs and family understands his poor prognosis, as such patient was made DNR per discussion with family as they would not want any further aggressive or heroic measures to be taken.  Patient remains medically stable for discharge patient's disability should be updated in June/July which should assist with placement.    Major events: 3/8: Intubated in ED, pontine bleed/SAH. CT head 17:09 Acute large hemorrhage 4.1 cm in pons with intraventricular extension without hydrocephalus 3/9: CTA 03:14 AM No change in IPH in pons, similar biparietal [facility] 3/14: Now awake, appears locked in can follow commands with eyes. 3/16 Purposeful with left upper extremity  3/22 tracheostomy placed at bedside, bleeding issues overnight from trach site 3/24 PEG (Dr. Hildy Lowers) 3/24 sputum culture with MRSA 3/27 vomited. TF on hold. Abd film c/w ileus. Added reglan , got SSE. Did tolerate PSV all day  3/28 BMs x2 after SSE day prior. Added back TFs at 1/2 rated. Tolerated PS 3/29 Tolerated PS  3/31 Tolerating CPAP PS 15/5, TF on hold due to ileus. 4/1:  con't to hold tube feeds today, restarting vancomycin  and sending tracheal aspirate as peaks are 37, plat 24, driving 19. Thick secretions. Fever overnight.  4/2: peaks improved. Ongoing hiccups. Trickle feeding. Neuro exam unchanged.  4/20: trach was changed to cuffless #8 5/1: Mental status appears to be somewhat improving off narcotics, more awake alert following simple commands(able to grimace, move left toes, track vertically with right eye).  Speech following to evaluate for possible communication options downsize to Shiley cuffless #6 XLT proximal  5/4 -held modafinil , low threshold to resume if mental status/ability to interact diminishes 5/10 PEg dislodged, foley bulb placed and IR consulted to replace tube 5/11 IR replaced PEG tube with 18 French balloon under tension and it was ready to use 5/13 fever-working up  Assessment & Plan:   Principal Problem:   ICH (intracerebral hemorrhage) (HCC) Active Problems:   Intracranial hemorrhage (HCC)   On mechanically assisted ventilation (HCC)   Advanced care planning/counseling discussion   Tracheostomy dependence (HCC)   Pressure injury of skin  Fever?  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 Catastrophic injury Repeat CT head 3/9,3/18 and 3/29 with decreased size Echo EF 60-65% A1c 4.7  LDL 96 Narcotics and modafanil held--seems more coherent overall--working intermittently with SLP goal BP below 160--labetalol  and hydralazine  IV-start back amlodipine  10,  metoprolol  100 twice daily   Acute respiratory failure  In the setting of large pontine stroke/brainstem compression Unable to protect airway Trach placed 11/10/23 changed to cuffless 4/21 Pulmonology following - 5/1: Shiley cuffless #6 XLT proximal-patient was last seen by pulmonary on 5/12 and likely given difficulty with phonation and inability to protect airway is probably not a candidate for decannulation  Aspiration pneumonia, multifactorial, resolving Complicated by recently placed trach (11/10/23) Dysphagia in the setting of CVA and ongoing dysphagia  SLP continues to follow as above Completed vancomycin /linezolid  12/04/2023  Moderate to severe protein caloric malnutrition, POA PEG came out 5/10--replaced on 5/11 and feeds resumed-watch closely given risk for aspiration pneumonia Resuming p.o. 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   Macrocytic anemia thrombocytosis Overall stable/resolved Perineal skin injury, POA - Continue Gerhard's Butt cream--no wounds on exam when skin examined with Nursing on 5/9   DVT prophylaxis: SCDs Start: 10/27/23 2226 Code Status:   Code Status: Do not attempt resuscitation (DNR) PRE-ARREST INTERVENTIONS DESIRED Family Communication  No family today  Status is: Inpatient  Dispo: The patient is from: Home              Anticipated d/c is to: Facility              Anticipated d/c date is: Imminent              Patient currently is medically stable for discharge  Consultants:  PCCM, palliative care, general surgery,  ophthalmology  Antimicrobials:  None currently  Subjective:  Fevers overnight documented nursing letting me know that tachycardic Overall appears grossly unchanged   Objective: Vitals:   01/01/24 0413 01/01/24 0747 01/01/24 0843 01/01/24 0942  BP:  134/89  (!) 135/99  Pulse:  (!) 126 (!) 133   Resp: 20 (!) 28 (!) 29   Temp:  (!) 102.4 F (39.1 C)  (!) 101.5 F (38.6 C)  TempSrc:  Oral  Axillary  SpO2:  96% 96%   Weight:      Height:        Intake/Output Summary (Last 24 hours) at 01/01/2024 1054 Last data filed at 01/01/2024 0549 Gross per 24 hour  Intake 3200 ml  Output 1400 ml  Net 1800 ml   Filed Weights   12/26/23 0454 12/31/23 0500 01/01/24 0358  Weight: 98.6 kg 97.5 kg 93.4 kg    Examination:  Awake R eye consistently open, L eye taped I am able to listen to his back with assistance of nursing I do not hear any rales or rhonchi Abdominal soft he has PEG tube in place that is infusing feeds Power is 5/5 He has some unformed stool   CBC: No results for input(s): "WBC", "NEUTROABS", "HGB", "HCT", "MCV", "PLT" in the last 168 hours.  Basic Metabolic Panel: Recent Labs  Lab 12/30/23 0539  NA 137  K 4.3  CL 101  CO2 25  GLUCOSE 103*  BUN 21*  CREATININE 0.67  CALCIUM 9.6  GFR: Estimated Creatinine Clearance: 126.6 mL/min (by C-G formula based on SCr of 0.67 mg/dL).  CBG: Recent Labs  Lab 12/31/23 1553 12/31/23 1949 12/31/23 2247 01/01/24 0353 01/01/24 0749  GLUCAP 142* 152* 142* 95 132*   Radiology Studies: DG ABDOMEN PEG TUBE LOCATION Result Date: 12/30/2023 CLINICAL DATA:  [id number] PEG (percutaneous endoscopic gastrostomy) adjustment/replacement/removal (HCC) 045409. Gastrografin 30 mL administered via the PEG tube EXAM: ABDOMEN - 1 VIEW COMPARISON:  None Available. FINDINGS: PO contrast was administered via the gastrostomy tube. Left upper quadrant percutaneous gastrostomy tube noted. Partial opacification of the gastric lumen with  administered PO contrast. No radiographic findings suggest extravasation of the PO contrast. The bowel gas pattern is normal. No radio-opaque calculi or other significant radiographic abnormality are seen. IMPRESSION: Percutaneous gastrostomy tube in appropriate position. Electronically Signed   By: Morgane  Naveau M.D.   On: 12/30/2023 18:34    Scheduled Meds:  amLODipine   10 mg Oral Daily   artificial tears   Left Eye Q8H   Chlorhexidine  Gluconate Cloth  6 each Topical Daily   enoxaparin  (LOVENOX ) injection  50 mg Subcutaneous Daily   famotidine   20 mg Per Tube BID   feeding supplement (PROSource TF20)  60 mL Per Tube TID   free water   200 mL Per Tube Q4H   insulin  aspart  0-15 Units Subcutaneous Q4H   metoprolol  tartrate  100 mg Per Tube BID   multivitamin with minerals  1 tablet Per Tube Daily   nutrition supplement (JUVEN)  1 packet Per Tube BID BM   mouth rinse  15 mL Mouth Rinse 4 times per day   Continuous Infusions:  feeding supplement (OSMOLITE 1.2 CAL) 1,000 mL (12/30/23 1900)   feeding supplement (OSMOLITE 1.5 CAL) Stopped (12/29/23 1516)     LOS: 66 days   Time spent: 33 min  Jai-Gurmukh Katlynn Naser,  [facility]  If 7PM-7AM, please contact night-coverage [email or web address]  01/01/2024, 10:54 AM

## 2024-01-01 NOTE — Plan of Care (Signed)
  Problem: Coping: Goal: Will verbalize positive feelings about self Outcome: Progressing   Problem: Self-Care: Goal: Ability to participate in self-care as condition permits will improve Outcome: Progressing   Problem: Nutrition: Goal: Risk of aspiration will decrease Outcome: Progressing   Problem: Coping: Goal: Ability to adjust to condition or change in health will improve Outcome: Progressing

## 2024-01-01 NOTE — Plan of Care (Signed)
  Problem: Education: Goal: Knowledge of disease or condition will improve Outcome: Progressing Goal: Knowledge of secondary prevention will improve (MUST DOCUMENT ALL) Outcome: Progressing Goal: Knowledge of patient specific risk factors will improve (DELETE if not current risk factor) Outcome: Progressing   Problem: Intracerebral Hemorrhage Tissue Perfusion: Goal: Complications of Intracerebral Hemorrhage will be minimized Outcome: Progressing   Problem: Coping: Goal: Will verbalize positive feelings about self Outcome: Progressing Goal: Will identify appropriate support needs Outcome: Progressing   Problem: Health Behavior/Discharge Planning: Goal: Ability to manage health-related needs will improve Outcome: Progressing Goal: Goals will be collaboratively established with patient/family Outcome: Progressing   Problem: Self-Care: Goal: Ability to participate in self-care as condition permits will improve Outcome: Progressing Goal: Verbalization of feelings and concerns over difficulty with self-care will improve Outcome: Progressing Goal: Ability to communicate needs accurately will improve Outcome: Progressing   Problem: Nutrition: Goal: Risk of aspiration will decrease Outcome: Progressing Goal: Dietary intake will improve Outcome: Progressing   Problem: Education: Goal: Ability to describe self-care measures that may prevent or decrease complications (Diabetes Survival Skills Education) will improve Outcome: Progressing   Problem: Coping: Goal: Ability to adjust to condition or change in health will improve Outcome: Progressing   Problem: Fluid Volume: Goal: Ability to maintain a balanced intake and output will improve Outcome: Progressing   Problem: Health Behavior/Discharge Planning: Goal: Ability to identify and utilize available resources and services will improve Outcome: Progressing Goal: Ability to manage health-related needs will improve Outcome:  Progressing   Problem: Metabolic: Goal: Ability to maintain appropriate glucose levels will improve Outcome: Progressing   Problem: Nutritional: Goal: Maintenance of adequate nutrition will improve Outcome: Progressing Goal: Progress toward achieving an optimal weight will improve Outcome: Progressing   Problem: Skin Integrity: Goal: Risk for impaired skin integrity will decrease Outcome: Progressing   Problem: Tissue Perfusion: Goal: Adequacy of tissue perfusion will improve Outcome: Progressing   Problem: Education: Goal: Knowledge of General Education information will improve Description: Including pain rating scale, medication(s)/side effects and non-pharmacologic comfort measures Outcome: Progressing   Problem: Health Behavior/Discharge Planning: Goal: Ability to manage health-related needs will improve Outcome: Progressing   Problem: Clinical Measurements: Goal: Ability to maintain clinical measurements within normal limits will improve Outcome: Progressing Goal: Will remain free from infection Outcome: Progressing Goal: Diagnostic test results will improve Outcome: Progressing Goal: Respiratory complications will improve Outcome: Progressing Goal: Cardiovascular complication will be avoided Outcome: Progressing   Problem: Activity: Goal: Risk for activity intolerance will decrease Outcome: Progressing   Problem: Nutrition: Goal: Adequate nutrition will be maintained Outcome: Progressing   Problem: Coping: Goal: Level of anxiety will decrease Outcome: Progressing   Problem: Elimination: Goal: Will not experience complications related to bowel motility Outcome: Progressing Goal: Will not experience complications related to urinary retention Outcome: Progressing   Problem: Pain Managment: Goal: General experience of comfort will improve and/or be controlled Outcome: Progressing   Problem: Safety: Goal: Ability to remain free from injury will  improve Outcome: Progressing   Problem: Skin Integrity: Goal: Risk for impaired skin integrity will decrease Outcome: Progressing   Problem: Education: Goal: 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 Outcome: Progressing   Problem: Activity: Goal: Ability to tolerate increased activity will improve Outcome: Progressing   Problem: Health Behavior/Discharge Planning: Goal: Ability to manage tracheostomy will improve Outcome: Progressing   Problem: Respiratory: Goal: Patent airway maintenance will improve Outcome: Progressing   Problem: Role Relationship: Goal: Ability to communicate will improve Outcome: Progressing

## 2024-01-02 DIAGNOSIS — I613 Nontraumatic intracerebral hemorrhage in brain stem: Secondary | ICD-10-CM | POA: Diagnosis not present

## 2024-01-02 LAB — GLUCOSE, CAPILLARY
Glucose-Capillary: 112 mg/dL — ABNORMAL HIGH (ref 70–99)
Glucose-Capillary: 173 mg/dL — ABNORMAL HIGH (ref 70–99)
Glucose-Capillary: 119 mg/dL — ABNORMAL HIGH (ref 70–99)
Glucose-Capillary: 108 mg/dL — ABNORMAL HIGH (ref 70–99)
Glucose-Capillary: 144 mg/dL — ABNORMAL HIGH (ref 70–99)

## 2024-01-02 LAB — CBC WITH DIFFERENTIAL/PLATELET
Abs Immature Granulocytes: 0.09 10*3/uL — ABNORMAL HIGH (ref 0.00–0.07)
Basophils Absolute: 0 10*3/uL (ref 0.0–0.1)
Basophils Relative: 0 %
Eosinophils Absolute: 0.1 10*3/uL (ref 0.0–0.5)
Eosinophils Relative: 0 %
HCT: 34.7 % — ABNORMAL LOW (ref 39.0–52.0)
Hemoglobin: 11.2 g/dL — ABNORMAL LOW (ref 13.0–17.0)
Immature Granulocytes: 0 %
Lymphocytes Relative: 12 %
Lymphs Abs: 2.5 10*3/uL (ref 0.7–4.0)
MCH: 29.8 pg (ref 26.0–34.0)
MCHC: 32.3 g/dL (ref 30.0–36.0)
MCV: 92.3 fL (ref 80.0–100.0)
Monocytes Absolute: 1.5 10*3/uL — ABNORMAL HIGH (ref 0.1–1.0)
Monocytes Relative: 8 %
Neutro Abs: 16.3 10*3/uL — ABNORMAL HIGH (ref 1.7–7.7)
Neutrophils Relative %: 80 %
Platelets: 452 10*3/uL — ABNORMAL HIGH (ref 150–400)
RBC: 3.76 MIL/uL — ABNORMAL LOW (ref 4.22–5.81)
RDW: 13.6 % (ref 11.5–15.5)
WBC: 20.5 10*3/uL — ABNORMAL HIGH (ref 4.0–10.5)
nRBC: 0 % (ref 0.0–0.2)

## 2024-01-02 LAB — COMPREHENSIVE METABOLIC PANEL WITH GFR
ALT: 186 U/L — ABNORMAL HIGH (ref 0–44)
AST: 47 U/L — ABNORMAL HIGH (ref 15–41)
Albumin: 2.8 g/dL — ABNORMAL LOW (ref 3.5–5.0)
Alkaline Phosphatase: 80 U/L (ref 38–126)
Anion gap: 10 (ref 5–15)
BUN: 24 mg/dL — ABNORMAL HIGH (ref 6–20)
CO2: 25 mmol/L (ref 22–32)
Calcium: 8.8 mg/dL — ABNORMAL LOW (ref 8.9–10.3)
Chloride: 100 mmol/L (ref 98–111)
Creatinine, Ser: 0.62 mg/dL (ref 0.61–1.24)
GFR, Estimated: >60 mL/min (ref >60)
Glucose, Bld: 123 mg/dL — ABNORMAL HIGH (ref 70–99)
Potassium: 3.4 mmol/L — ABNORMAL LOW (ref 3.5–5.1)
Sodium: 135 mmol/L (ref 135–145)
Total Bilirubin: 0.3 mg/dL (ref 0.0–1.2)
Total Protein: 7.3 g/dL (ref 6.5–8.1)

## 2024-01-02 LAB — PROCALCITONIN: Procalcitonin: 0.23 ng/mL

## 2024-01-02 LAB — C-REACTIVE PROTEIN: CRP: 15.9 mg/dL — ABNORMAL HIGH (ref <1.0)

## 2024-01-02 MED ORDER — SODIUM CHLORIDE 0.9 % IV SOLN: INTRAVENOUS | Status: DC

## 2024-01-02 MED ORDER — SODIUM CHLORIDE 0.9 % IV SOLN
2.0000 g | Freq: Three times a day (TID) | INTRAVENOUS | Status: DC
  Administered 2024-01-02 – 2024-01-03 (×3): 2 g via INTRAVENOUS
  Filled 2024-01-02 (×4): qty 12.5

## 2024-01-02 MED ORDER — VANCOMYCIN HCL 1.25 G IV SOLR
1250.0000 mg | INTRAVENOUS | Status: DC
  Administered 2024-01-02 – 2024-01-04 (×3): 1250 mg via INTRAVENOUS
  Filled 2024-01-02 (×3): qty 25

## 2024-01-02 NOTE — TOC Progression Note (Addendum)
 Transition of Care [facility]) - Progression Note    Patient Details  Name: Garrett [name] MRN: [id number] Date of Birth: 05-Apr-1976  Transition of [facility] CM/SW Contact  Valery Gaucher, [name] Phone Number: 01/02/2024, 10:34 AM  Clinical Narrative:     Per chart review patient is stable for d/c. Patient has no bed offers and no payer source Patient has disability pending- not expecting determination until June or July.   TOC will continue to follow and assist with discharge planning.   Liddie Reel, MSW, LCSW Clinical Social Worker    Expected Discharge Plan: Long Term Nursing Home Barriers to Discharge: Continued Medical Work up, SNF Pending Medicaid, SNF Pending bed offer, SNF Pending payor source - LOG, Inadequate or no insurance (New trach)  Expected Discharge Plan and Services In-house Referral: Clinical Social Work, Hospice / Palliative Care   Post Acute Care Choice: Skilled Nursing Facility Living arrangements for the past 2 months: Single Family Home                                       Social Determinants of Health (SDOH) Interventions SDOH Screenings   Food Insecurity: Patient Unable To Answer (10/29/2023)  Social Connections: Unknown (06/23/2023)   Received from [facility]  Tobacco Use: High Risk (10/31/2023)    Readmission Risk Interventions     No data to display

## 2024-01-02 NOTE — Plan of Care (Signed)
  Problem: Education: Goal: Knowledge of disease or condition will improve Outcome: Progressing Goal: Knowledge of secondary prevention will improve (MUST DOCUMENT ALL) Outcome: Progressing Goal: Knowledge of patient specific risk factors will improve (DELETE if not current risk factor) Outcome: Progressing   Problem: Intracerebral Hemorrhage Tissue Perfusion: Goal: Complications of Intracerebral Hemorrhage will be minimized Outcome: Progressing   Problem: Coping: Goal: Will verbalize positive feelings about self Outcome: Progressing Goal: Will identify appropriate support needs Outcome: Progressing   Problem: Health Behavior/Discharge Planning: Goal: Ability to manage health-related needs will improve Outcome: Progressing Goal: Goals will be collaboratively established with patient/family Outcome: Progressing   Problem: Self-Care: Goal: Ability to participate in self-care as condition permits will improve Outcome: Progressing Goal: Verbalization of feelings and concerns over difficulty with self-care will improve Outcome: Progressing Goal: Ability to communicate needs accurately will improve Outcome: Progressing   Problem: Nutrition: Goal: Risk of aspiration will decrease Outcome: Progressing Goal: Dietary intake will improve Outcome: Progressing   Problem: Education: Goal: Ability to describe self-care measures that may prevent or decrease complications (Diabetes Survival Skills Education) will improve Outcome: Progressing   Problem: Coping: Goal: Ability to adjust to condition or change in health will improve Outcome: Progressing   Problem: Fluid Volume: Goal: Ability to maintain a balanced intake and output will improve Outcome: Progressing   Problem: Health Behavior/Discharge Planning: Goal: Ability to identify and utilize available resources and services will improve Outcome: Progressing Goal: Ability to manage health-related needs will improve Outcome:  Progressing   Problem: Metabolic: Goal: Ability to maintain appropriate glucose levels will improve Outcome: Progressing   Problem: Nutritional: Goal: Maintenance of adequate nutrition will improve Outcome: Progressing Goal: Progress toward achieving an optimal weight will improve Outcome: Progressing   Problem: Skin Integrity: Goal: Risk for impaired skin integrity will decrease Outcome: Progressing   Problem: Tissue Perfusion: Goal: Adequacy of tissue perfusion will improve Outcome: Progressing   Problem: Education: Goal: Knowledge of General Education information will improve Description: Including pain rating scale, medication(s)/side effects and non-pharmacologic comfort measures Outcome: Progressing   Problem: Health Behavior/Discharge Planning: Goal: Ability to manage health-related needs will improve Outcome: Progressing   Problem: Clinical Measurements: Goal: Ability to maintain clinical measurements within normal limits will improve Outcome: Progressing Goal: Will remain free from infection Outcome: Progressing Goal: Diagnostic test results will improve Outcome: Progressing Goal: Respiratory complications will improve Outcome: Progressing Goal: Cardiovascular complication will be avoided Outcome: Progressing   Problem: Activity: Goal: Risk for activity intolerance will decrease Outcome: Progressing   Problem: Nutrition: Goal: Adequate nutrition will be maintained Outcome: Progressing   Problem: Coping: Goal: Level of anxiety will decrease Outcome: Progressing   Problem: Elimination: Goal: Will not experience complications related to bowel motility Outcome: Progressing Goal: Will not experience complications related to urinary retention Outcome: Progressing   Problem: Pain Managment: Goal: General experience of comfort will improve and/or be controlled Outcome: Progressing   Problem: Safety: Goal: Ability to remain free from injury will  improve Outcome: Progressing   Problem: Skin Integrity: Goal: Risk for impaired skin integrity will decrease Outcome: Progressing   Problem: Education: Goal: 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 Outcome: Progressing   Problem: Activity: Goal: Ability to tolerate increased activity will improve Outcome: Progressing   Problem: Health Behavior/Discharge Planning: Goal: Ability to manage tracheostomy will improve Outcome: Progressing   Problem: Respiratory: Goal: Patent airway maintenance will improve Outcome: Progressing   Problem: Role Relationship: Goal: Ability to communicate will improve Outcome: Progressing

## 2024-01-02 NOTE — Progress Notes (Signed)
 Pharmacy Antibiotic Note  Lajavion Dronen is a 48 y.o. male admitted on 10/27/2023 with pneumonia and UTI.  Pharmacy has been consulted for vancomycin , cefepime  dosing. Escalation of therapy per MD d/t fevers on current therapy of ceftriaxone for Ecoli in [place].   Last WBC 23.1 , Tmax 100.25F today, CXR read as no PNA by radiologist  Plan: Cefepime  2g IV q8h Vancomycin  1250mg  IV q 24h (previously therapeutic regimen) F/u cultures, susceptibilities, and LOT  Height: 5\' 8"  (172.7 cm) Weight: 93.4 kg (205 lb 14.6 oz) IBW/kg (Calculated) : 68.4  Temp (24hrs), Avg:99.8 F (37.7 C), Min:97.8 F (36.6 C), Max:100.9 F (38.3 C)  Recent Labs  Lab 12/30/23 0539 01/01/24 1108  WBC  --  23.1*  CREATININE 0.67 0.70    Estimated Creatinine Clearance: 126.6 mL/min (by C-G formula based on SCr of 0.7 mg/dL).    Allergies  Allergen Reactions   Tomato Hives, Itching and Rash    Antimicrobials this admission: Vanc/Cefepime  x1 3/8 Unasyn  3/9 > 3/14; 3/23 >3/24 Vanc 3/24>> 3/31; 4/1>4/9; 5/14> Cefepime  5/14> Linezolid  4/9 > 4/16 Rocephin 5/13 >5/14  Dose adjustments this admission:   Microbiology results: 3/8 MRSA PCR - positive 3/8 BCx - neg UA/CXR negative 3/19 BAL 3/23: MRSA 4/1 TA: MRSA 4/8 TA: MRSA  5/13 BCx - NGTD 5/13 UCx - 100K E.coli, preliminary 5/14 Resp cx ordered  Thank you for allowing pharmacy to be a part of this patient's care.  Oralee Billow, PharmD, BCCCP Clinical Pharmacist 01/02/2024 5:24 PM

## 2024-01-02 NOTE — Progress Notes (Signed)
 Occupational Therapy Treatment Patient Details Name: Garrett [name] MRN: [id number] DOB: [birth year] Today's Date: 01/02/2024   History of present illness Pt is a 48 yo male who was found down 10/27/23 with agonal breathing. 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 Intubated 3/8, cortrak placed 3/14, trach placed 3/22, PEG placed 3/24. PMH: HTN, smoker   OT comments  Pt's fiance ([name]) in at end of session and states "D" nods his head yes/no however unable to replicate this session. Pt using R eye to locate targets with 100 % accuracy. Squeezed L hand on command 3/5 attempts. Worked with fiance on trying to facilitate head control against supported surface. She states she has been completing PROM all extremeties. Will disucss with care team with plan going forward. Acute OT to follow.       If plan is discharge home, recommend the following:  Two people to help with walking and/or transfers;Two people to help with bathing/dressing/bathroom;Direct supervision/assist for medications management;Direct supervision/assist for financial management;Assist for transportation;Help with stairs or ramp for entrance   Equipment Recommendations  Wheelchair (measurements OT);Wheelchair cushion (measurements OT);Hospital bed;Hoyer lift    Recommendations for Other Services      Precautions / Restrictions Precautions Precautions: Fall;Other (comment) Recall of Precautions/Restrictions: Impaired Precaution/Restrictions Comments: trach, PEG, abdominal binder, SBP < 160 Required Braces or Orthoses: Other Brace Splint/Cast: Bil resting hand splints Splint/Cast - Date Prophylactic Dressing Applied (if applicable): 11/02/23 Other Brace: has bilateral prevalon boots Restrictions Weight Bearing Restrictions Per Provider Order: No        Mobility Bed Mobility               General bed mobility comments: Total A +2    Transfers                         Balance                                           ADL either performed or assessed with clinical judgement   ADL                                         General ADL Comments: total A    Extremity/Trunk Assessment Upper Extremity Assessment Upper Extremity Assessment: RUE deficits/detail RUE Deficits / Details: PROM WFL; no active movement noted LUE Deficits / Details: actively squeezing therapist's hand on command 3/5 attempts            Vision  L eye taped closed; tape removed; lid partially opened however salve or discharge in eye; does not appear to have a conjugate gaze. Will further assess. R eye moves vertically and appears to move out toward R minimally.      Perception     Praxis     Communication Communication Communication: Impaired Factors Affecting Communication: Trach/intubated   Cognition Arousal: Lethargic Behavior During Therapy: Flat affect Cognition: Difficult to assess Difficult to assess due to: Impaired communication           OT - Cognition Comments: attempts to communicate with eye movement  Following commands: Impaired        Cueing      Exercises General Exercises - Upper Extremity Shoulder Flexion: PROM, Both, Supine, 10 reps Shoulder ABduction: PROM, Both, 10 reps, Supine Elbow Flexion: Both, 10 reps, PROM Elbow Extension: PROM, Both, 10 reps Wrist Flexion: Both, 10 reps Wrist Extension: PROM, Both, 10 reps Digit Composite Flexion: PROM, Both, 10 reps Composite Extension: PROM, Both, 10 reps Other Exercises Other Exercises: head control @ bed level in modified chair position - worked with fiance up looking up to facilitate neck extension    Shoulder Instructions       General Comments      Pertinent Vitals/ Pain       Pain  Assessment Pain Assessment: Faces Faces Pain Scale: No hurt  Home Living                                          Prior Functioning/Environment              Frequency  Min 1X/week        Progress Toward Goals  OT Goals(current goals can now be found in the care plan section)  Progress towards OT goals: Progressing toward goals  Acute Rehab OT Goals Patient Stated Goal: unable ot participate OT Goal Formulation: With family Time For Goal Achievement: 01/09/24 Potential to Achieve Goals: Fair ADL Goals Pt/caregiver will Perform Home Exercise Program: Increased ROM;Both right and left upper extremity Additional ADL Goal #1: Pt will follow 1 step commands with 50% accuracy in a nondistracting enviornment. Additional ADL Goal #2: Pt will complete bed mobility with max assist +2, maintain sitting balance statically for 3 minutes with mod assist. Additional ADL Goal #3: Pt will demonstrates grasp and release with L UE x3 out 3 attempts  Plan      Co-evaluation                 AM-PAC OT "6 Clicks" Daily Activity     Outcome Measure   Help from another person eating meals?: Total Help from another person taking care of personal grooming?: Total Help from another person toileting, which includes using toliet, bedpan, or urinal?: Total Help from another person bathing (including washing, rinsing, drying)?: Total Help from another person to put on and taking off regular upper body clothing?: Total Help from another person to put on and taking off regular lower body clothing?: Total 6 Click Score: 6    End of Session Equipment Utilized During Treatment: Oxygen  OT Visit Diagnosis: Other abnormalities of gait and mobility (R26.89);Muscle weakness (generalized) (M62.81);Other symptoms and signs involving the nervous system (R29.898);Other symptoms and signs involving cognitive function;Hemiplegia and hemiparesis Hemiplegia - caused by: Other  Nontraumatic intracranial hemorrhage   Activity Tolerance Patient limited by fatigue   Patient Left in bed;with call bell/phone within reach;with bed alarm set;with family/visitor present   Nurse Communication Mobility status        Time: 0981-1914 OT Time Calculation (min): 29 min  Charges: OT General Charges $OT Visit: 1 Visit OT Treatments $Therapeutic Activity: 23-37 mins  Milburn Aliment, OT/L   Acute OT Clinical Specialist Acute Rehabilitation Services Pager [phone] Office [phone]   [facility] 01/02/2024, 5:13 PM

## 2024-01-02 NOTE — Progress Notes (Signed)
 [facility] Progress Note Patient: Garrett [name] MVH:[id number] DOB: August 07, 1976 DOA: 10/27/2023  DOS: the patient was seen and examined on 01/02/2024  Brief Hospital Course: Patient is a 48 year old male with no known medical history.  Patient found down on 10/27/2023 unresponsive with UDS positive for opiates, minimal responsiveness to Narcan .  Found to be markedly hypertensive at 260/156.  CT head at intake shows 4.1 cm intracranial hemorrhage at the pons with intraventricular extension into the fourth ventricle and basal cisterns.  Initially admitted by critical care, now extubated to tracheostomy which was placed 3/22.  PEG placed 3/24.  Disposition remains difficult given patient's needs with PEG and tracheostomy tube.   Major events: 3/8: Intubated in ED, pontine bleed/SAH. CT head Acute large hemorrhage 4.1 cm in pons with intraventricular extension without hydrocephalus 3/9: CTA No change in IPH in pons, similar biparietal [facility] 3/14: Now awake, appears locked in. can follow commands with eyes. 3/16 Purposeful with left upper extremity  3/22 tracheostomy placed at bedside, bleeding issues overnight from trach site 3/24 PEG (Dr. Hildy Lowers) 3/24 sputum culture with MRSA 4/20: trach was changed to cuffless #8 5/1: Mental status appears to be somewhat improving off narcotics, more awake alert following simple commands(able to grimace, move left toes, track vertically with right eye).  Speech following. Trach downsized to [employer] cuffless #6 XLT 5/4 -held modafinil , low threshold to resume if mental status/ability to interact diminishes 5/10 PEG dislodged, foley bulb placed and IR consulted to replace tube 5/11 IR replaced PEG tube 5/13 and 5/14 fever.  Back on antibiotic.  Urine culture growing E. coli.   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 Last CT scan 3/29 with reduction in size of the hemorrhage. Echo EF 60-65%, A1c 4.7, LDL 96 UDS positive for opioids only. Treated with 3% hypertonic saline. 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  Aspiration pneumonia Dysphagia in the setting of CVA SLP continues to follow as above Tracheal aspirate has been growing MRSA.  Treated with vancomycin  and then later on was switched to Zyvox  due to persistent fever with recurrent aspiration.  Febrile again on 5/13. Possible E. coli UTI Tmax of 102.  Urine culture is growing E. coli. Started on ceftriaxone. Given the presence of the Foley catheter I will broaden antibiotic coverage to cefepime . Add vancomycin  as well and check procalcitonin level to ensure that aspiration pneumonia from MRSA is not back. Treated with Zyvox  in the past due to persistent fever despite being on vancomycin .  Moderate to severe protein caloric malnutrition, POA Currently on tube feeding and PEG dependent.  PEG tube malfunction.  Resolved. 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 Prior removals/voiding trial unsuccessful Last catheter change was on 5/13.  Macrocytic anemia thrombocytosis Overall stable/resolved Perineal skin injury, POA -  Continue Gerhard's Butt cream Goals of care conversation.  Switch to DNR on 3/13. Relative B12 and folate deficiency. Current replacement with MVI   Subjective: No nausea no vomiting no fever no chills.  RN reports no significant secretion issues.  Foley catheter was changed yesterday.  Fever improved but later in the day become tachypneic and tachycardic.  Also had a low-grade fever.  Physical Exam: General: in Mild distress, No Rash Cardiovascular: S1 and S2 Present, No Murmur Respiratory: Good respiratory effort, Bilateral Air entry present. No Crackles, No wheezes Abdomen: Bowel Sound present, difficult to assess tenderness Extremities: No edema Neuro: Alert unable to follow commands.  Nonverbal.   Data Reviewed: I have Reviewed nursing notes, Vitals, and Lab results. Since last encounter, pertinent lab results CBC and BMP   . I have ordered test including CBC, BMP, procalcitonin, CRP.  Aaron Aas   Disposition: Status is: Inpatient Remains inpatient appropriate because: Monitor for improvement in fever curve.  SCDs Start: 10/27/23 2226   Family Communication: No one at bedside. Level of care: Progressive   Vitals:   01/02/24 1526 01/02/24 1540 01/02/24 1800 01/02/24 1813  BP: 126/87 (!) 138/98 (!) 132/97   Pulse: (!) 106 (!) 111 (!) 121 (!) 123  Resp: (!) 24 (!) 30 (!) 27 (!) 32  Temp:  (!) 100.9 F (38.3 C) (!) 101 F (38.3 C) (!) 101 F (38.3 C)  TempSrc:  Axillary Axillary   SpO2: 94% 93% 93% 93%  Weight:      Height:         Author: Charlean Congress, MD 01/02/2024 7:08 PM  Please look on [email or web address] to find out who is on call.

## 2024-01-02 NOTE — Progress Notes (Signed)
 Pt had antibiotics ordered to be administered but medication was unavailable from pharmacy at time 1830. Medication due time readjusted for night shift and report given to oncoming RN. P. Amo Jaclyne Haverstick RN

## 2024-01-02 NOTE — Progress Notes (Signed)
 Speech Language Pathology Treatment: Cognitive-Linquistic  Patient Details Name: Garrett [name] MRN: [id number] DOB: 1976-05-24 Today's Date: 01/02/2024 Time: 7829-5621 SLP Time Calculation (min) (ACUTE ONLY): 12 min  Assessment / Plan / Recommendation Clinical Impression  Pt was seen to address communication/voice.  He alerted to name upon entering room. Vertical yes/no sign used in central visual field to allow means for responding to basic questions.  He initially responded to questions about comfort, then closed eye, was less participatory.  PMV was placed; oral suctioning provided, removing standing secretions. There was exhalatory phonation - no response to cues to generate intentional voicing. Tolerated valve over ten min period with occasional removal to ensure no backflow.  Vital signs were stable.   Will plan next session for co-treat with OT or PT to facilitate upright positioning, optimize voicing/respiratory support.    HPI HPI: Pt is a 48 yo male who was found down 10/27/23 with agonal breathing. 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 Intubated 3/8, cortrak placed 3/14, trach placed 3/22, PEG placed 3/24. PMH: HTN, smoker      SLP Plan  Continue with current plan of care      Recommendations for follow up therapy are one component of a multi-disciplinary discharge planning process, led by the attending physician.  Recommendations may be updated based on patient status, additional functional criteria and insurance authorization.    Recommendations  Medication Administration: Via alternative means      Patient may use Passy-Muir Speech Valve: During all therapies with supervision;Intermittently with supervision PMSV Supervision: Full           Oral care QID   Frequent or constant  Supervision/Assistance Aphonia (R49.1);Cognitive communication deficit (R41.841)     Continue with current plan of care    Garrett [name] L. Garrett Lincoln, MA CCC/SLP Clinical Specialist - Acute Care SLP Acute Rehabilitation Services Office number [phone]  Garrett [name]  01/02/2024, 3:33 PM

## 2024-01-02 NOTE — Progress Notes (Signed)
 Pt was started on [place] protocol today.The pt has had some apneic breathing episodes and respiratory has been made aware. MD aware and has order some labs.   01/02/24 1540  Vitals  Temp (!) 100.9 F (38.3 C)  Temp Source Axillary  BP (!) 138/98  MAP (mmHg) 111  BP Location Right Arm  BP Method Automatic  Patient Position (if appropriate) Lying  Pulse Rate (!) 111  Pulse Rate Source Monitor  ECG Heart Rate (!) 112  Resp (!) 30  MEWS COLOR  MEWS Score Color Red  Oxygen Therapy  SpO2 93 %  O2 Device Tracheostomy Collar  MEWS Score  MEWS Temp 1  MEWS Systolic 0  MEWS Pulse 2  MEWS RR 2  MEWS LOC 0  MEWS Score 5

## 2024-01-03 ENCOUNTER — Inpatient Hospital Stay (HOSPITAL_COMMUNITY)

## 2024-01-03 DIAGNOSIS — I613 Nontraumatic intracerebral hemorrhage in brain stem: Secondary | ICD-10-CM | POA: Diagnosis not present

## 2024-01-03 LAB — GLUCOSE, CAPILLARY
Glucose-Capillary: 130 mg/dL — ABNORMAL HIGH (ref 70–99)
Glucose-Capillary: 112 mg/dL — ABNORMAL HIGH (ref 70–99)
Glucose-Capillary: 164 mg/dL — ABNORMAL HIGH (ref 70–99)
Glucose-Capillary: 123 mg/dL — ABNORMAL HIGH (ref 70–99)
Glucose-Capillary: 115 mg/dL — ABNORMAL HIGH (ref 70–99)
Glucose-Capillary: 86 mg/dL (ref 70–99)
Glucose-Capillary: 113 mg/dL — ABNORMAL HIGH (ref 70–99)

## 2024-01-03 LAB — BASIC METABOLIC PANEL WITH GFR
Anion gap: 11 (ref 5–15)
BUN: 19 mg/dL (ref 6–20)
CO2: 22 mmol/L (ref 22–32)
Calcium: 8.7 mg/dL — ABNORMAL LOW (ref 8.9–10.3)
Chloride: 101 mmol/L (ref 98–111)
Creatinine, Ser: 0.77 mg/dL (ref 0.61–1.24)
GFR, Estimated: >60 mL/min (ref >60)
Glucose, Bld: 157 mg/dL — ABNORMAL HIGH (ref 70–99)
Potassium: 3.7 mmol/L (ref 3.5–5.1)
Sodium: 134 mmol/L — ABNORMAL LOW (ref 135–145)

## 2024-01-03 LAB — CBC
HCT: 34.9 % — ABNORMAL LOW (ref 39.0–52.0)
Hemoglobin: 11.3 g/dL — ABNORMAL LOW (ref 13.0–17.0)
MCH: 30 pg (ref 26.0–34.0)
MCHC: 32.4 g/dL (ref 30.0–36.0)
MCV: 92.6 fL (ref 80.0–100.0)
Platelets: 426 10*3/uL — ABNORMAL HIGH (ref 150–400)
RBC: 3.77 MIL/uL — ABNORMAL LOW (ref 4.22–5.81)
RDW: 13.8 % (ref 11.5–15.5)
WBC: 12.7 10*3/uL — ABNORMAL HIGH (ref 4.0–10.5)
nRBC: 0 % (ref 0.0–0.2)

## 2024-01-03 LAB — URINE CULTURE: Culture: >=100000 — AB

## 2024-01-03 LAB — MAGNESIUM: Magnesium: 1.9 mg/dL (ref 1.7–2.4)

## 2024-01-03 MED ORDER — FREE WATER
150.0000 mL | Status: DC
  Administered 2024-01-03 – 2024-01-16 (×77): 150 mL

## 2024-01-03 MED ORDER — FREE WATER
100.0000 mL | Status: DC
  Administered 2024-01-03: 100 mL

## 2024-01-03 MED ORDER — SODIUM CHLORIDE 0.9 % IV SOLN
1.0000 g | INTRAVENOUS | Status: AC
  Administered 2024-01-03 – 2024-01-06 (×4): 1 g via INTRAVENOUS
  Filled 2024-01-03 (×4): qty 10

## 2024-01-03 NOTE — Progress Notes (Signed)
 [facility] Progress Note Patient: Garrett [name] ZOX:[id number] DOB: 02-04-1976 DOA: 10/27/2023  DOS: the patient was seen and examined on 01/03/2024  Brief Hospital Course: Patient is a 48 year old male with no known medical history.  Patient found down on 10/27/2023 unresponsive with UDS positive for opiates, minimal responsiveness to Narcan .  Found to be markedly hypertensive at 260/156.  CT head at intake shows 4.1 cm intracranial hemorrhage at the pons with intraventricular extension into the fourth ventricle and basal cisterns.  Initially admitted by critical care, now extubated to tracheostomy which was placed 3/22.  PEG placed 3/24.  Disposition remains difficult given patient's needs with PEG and tracheostomy tube.   Major events: 3/8: Intubated in ED, pontine bleed/SAH. CT head Acute large hemorrhage 4.1 cm in pons with intraventricular extension without hydrocephalus 3/9: CTA No change in IPH in pons, similar biparietal [facility] 3/14: Now awake, appears locked in. can follow commands with eyes. 3/16 Purposeful with left upper extremity  3/22 tracheostomy placed at bedside, bleeding issues overnight from trach site 3/24 PEG (Dr. Hildy Lowers) 3/24 sputum culture with MRSA 4/20: trach was changed to cuffless #8 5/1: Mental status appears to be somewhat improving off narcotics, more awake alert following simple commands(able to grimace, move left toes, track vertically with right eye).  Speech following. Trach downsized to [employer] cuffless #6 XLT 5/4 -held modafinil , low threshold to resume if mental status/ability to interact diminishes 5/10 PEG dislodged, foley bulb placed and IR consulted to replace tube 5/11 IR replaced PEG tube 5/13 and 5/14 fever.  Back on antibiotic.  Urine culture growing E. coli.    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 Last CT scan 3/29 with reduction in size of the hemorrhage. Echo EF 60-65%, A1c 4.7, LDL 96 UDS positive for opioids only. Treated with 3% hypertonic saline. 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  Aspiration pneumonia Dysphagia in the setting of CVA SLP continues to follow as above Tracheal aspirate has been growing MRSA.  Treated with vancomycin  and then later on was switched to Zyvox  due to persistent fever with recurrent aspiration.  Febrile again on 5/13. Possible E. coli UTI Tmax of 102.  Urine culture is growing E. coli. Started on ceftriaxone.  Later on was on cefepime  and vancomycin . Procalcitonin is elevated. Fever curve trending down. E. coli is pansensitive. Continue vancomycin  but switched to ceftriaxone. Treated with Zyvox  in the past due to persistent fever despite being on vancomycin .  Moderate to severe protein caloric malnutrition, POA Currently on tube feeding and PEG dependent.  PEG tube malfunction.  Resolved. 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 Prior removals/voiding trial unsuccessful Last catheter change was on 5/13.  Macrocytic anemia thrombocytosis Overall stable/resolved Perineal skin injury, POA - Continue Gerhard's Butt  cream  Goals of care conversation.  Switch to DNR on 3/13. Relative B12 and folate deficiency. Current replacement with MVI   Subjective: No nausea no vomiting no fever no chills.  Has shortness of breath.  Physical Exam: In mild distress. S1-S2 present. Able to follow commands with eye movement. Able to squeeze left upper extremity. Upper airway crackles. Bowel sound present.  No edema.  Data Reviewed: I have Reviewed nursing notes, Vitals, and Lab results. Since last encounter, pertinent lab results CBC and BMP   . I have ordered test including CBC and BMP  .   Disposition: Status is: Inpatient Remains inpatient appropriate because: Monitor for improvement in respiratory status  SCDs Start: 10/27/23 2226   Family Communication: No one at bedside. Level of care: Progressive   Vitals:   01/03/24 1100 01/03/24 1150 01/03/24 1600 01/03/24 1609  BP: (!) 135/92  123/89   Pulse: 93 94 (!) 104 (!) 105  Resp: (!) 25 12 (!) 26 (!) 26  Temp: 100.3 F (37.9 C)  99.7 F (37.6 C) 99.7 F (37.6 C)  TempSrc: Axillary  Axillary   SpO2: 96% 98% 95% 96%  Weight:      Height:         Author: Charlean Congress, MD 01/03/2024 7:13 PM  Please look on [email or web address] to find out who is on call.

## 2024-01-03 NOTE — Plan of Care (Signed)
  Problem: Education: Goal: Knowledge of disease or condition will improve Outcome: Progressing Goal: Knowledge of secondary prevention will improve (MUST DOCUMENT ALL) Outcome: Progressing Goal: Knowledge of patient specific risk factors will improve (DELETE if not current risk factor) Outcome: Progressing   Problem: Intracerebral Hemorrhage Tissue Perfusion: Goal: Complications of Intracerebral Hemorrhage will be minimized Outcome: Progressing   Problem: Coping: Goal: Will verbalize positive feelings about self Outcome: Progressing Goal: Will identify appropriate support needs Outcome: Progressing   Problem: Health Behavior/Discharge Planning: Goal: Ability to manage health-related needs will improve Outcome: Progressing Goal: Goals will be collaboratively established with patient/family Outcome: Progressing   Problem: Self-Care: Goal: Ability to participate in self-care as condition permits will improve Outcome: Progressing Goal: Verbalization of feelings and concerns over difficulty with self-care will improve Outcome: Progressing Goal: Ability to communicate needs accurately will improve Outcome: Progressing   Problem: Nutrition: Goal: Risk of aspiration will decrease Outcome: Progressing Goal: Dietary intake will improve Outcome: Progressing   Problem: Education: Goal: Ability to describe self-care measures that may prevent or decrease complications (Diabetes Survival Skills Education) will improve Outcome: Progressing   Problem: Coping: Goal: Ability to adjust to condition or change in health will improve Outcome: Progressing   Problem: Fluid Volume: Goal: Ability to maintain a balanced intake and output will improve Outcome: Progressing   Problem: Health Behavior/Discharge Planning: Goal: Ability to identify and utilize available resources and services will improve Outcome: Progressing Goal: Ability to manage health-related needs will improve Outcome:  Progressing   Problem: Metabolic: Goal: Ability to maintain appropriate glucose levels will improve Outcome: Progressing   Problem: Nutritional: Goal: Maintenance of adequate nutrition will improve Outcome: Progressing Goal: Progress toward achieving an optimal weight will improve Outcome: Progressing   Problem: Skin Integrity: Goal: Risk for impaired skin integrity will decrease Outcome: Progressing   Problem: Tissue Perfusion: Goal: Adequacy of tissue perfusion will improve Outcome: Progressing   Problem: Education: Goal: Knowledge of General Education information will improve Description: Including pain rating scale, medication(s)/side effects and non-pharmacologic comfort measures Outcome: Progressing   Problem: Health Behavior/Discharge Planning: Goal: Ability to manage health-related needs will improve Outcome: Progressing   Problem: Clinical Measurements: Goal: Ability to maintain clinical measurements within normal limits will improve Outcome: Progressing Goal: Will remain free from infection Outcome: Progressing Goal: Diagnostic test results will improve Outcome: Progressing Goal: Respiratory complications will improve Outcome: Progressing Goal: Cardiovascular complication will be avoided Outcome: Progressing   Problem: Activity: Goal: Risk for activity intolerance will decrease Outcome: Progressing   Problem: Nutrition: Goal: Adequate nutrition will be maintained Outcome: Progressing   Problem: Coping: Goal: Level of anxiety will decrease Outcome: Progressing   Problem: Elimination: Goal: Will not experience complications related to bowel motility Outcome: Progressing Goal: Will not experience complications related to urinary retention Outcome: Progressing   Problem: Pain Managment: Goal: General experience of comfort will improve and/or be controlled Outcome: Progressing   Problem: Safety: Goal: Ability to remain free from injury will  improve Outcome: Progressing   Problem: Skin Integrity: Goal: Risk for impaired skin integrity will decrease Outcome: Progressing   Problem: Education: Goal: 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 Outcome: Progressing   Problem: Activity: Goal: Ability to tolerate increased activity will improve Outcome: Progressing   Problem: Health Behavior/Discharge Planning: Goal: Ability to manage tracheostomy will improve Outcome: Progressing   Problem: Respiratory: Goal: Patent airway maintenance will improve Outcome: Progressing   Problem: Role Relationship: Goal: Ability to communicate will improve Outcome: Progressing

## 2024-01-03 NOTE — Progress Notes (Addendum)
 Nutrition Follow-up  DOCUMENTATION CODES:   Not applicable  INTERVENTION:   Continue tube feeds via PEG: - Osmolite 1.5 to  50 mL/hr (1200 mL/day) - PROSource TF20 60 mL TID - Decrease Free water  flush to 150 mL every 4 hours or per MD   Tube feeding regimen provides 2040 kcal, 135 grams of protein, and 914 ml of H2O.    Total free water  with flushes: 1814 mL   - Continue MVI with minerals daily per tube - Continue 1 packet Juven BID per tube to support wound healing, each packet provides 95 calories, 2.5 grams of protein (collagen), and 9.8 grams of carbohydrate  NUTRITION DIAGNOSIS:   Inadequate oral intake related to inability to eat as evidenced by NPO status.  - Still applicable   GOAL:   Patient will meet greater than or equal to 90% of their needs  - Meeting via TF's  MONITOR:   Labs, Weight trends, TF tolerance, Skin  REASON FOR ASSESSMENT:   Consult Enteral/tube feeding initiation and management  ASSESSMENT:   Pt with PMH of HTN, daily mariajuana, and smoker admitted after being found down at home with pontine hemorrhagic stroke and trace SAH. UDS positive for opioids. Noted aspiration PNA on admission.  3/08 - admitted with SAH, intubated 3/09 - adult TF protocol started 3/14 - s/p cortrak placement (tip gastric) 3/22 - s/p tracheostomy 3/24 - s/p PEG 3/26 - emesis, TF paused 3/28 - trickle TF starated 3/31 - TF off due to ileus 4/02 - trickle TF started  4/04 - TF back at goal 4/08 - episode of vomiting x 2, TF paused then resumed at trickle 4/11 - TF back at goal 4/20 - Cuff changed to cuffless 5/10 - PEG dislodged, TF's stopped  5/11 - PEG replaced, TF's restarted at goal    Patient  Continues with cuff less trach. Continues to tolerate TF's. No nausea or vomiting noted. Had BM last night. SLP continuing to follow. Uses yes/no sign to communicate. Has developed fevers, back on antibiotics.   When PEG replaced on 5/11, new tube feed order put  in for Osmolite 1.2. RD changed to correct formula. Sodium low, reduced FWF.   Per TOC notes, pt has disability pending but will not know determination until June or July.   Admit weight: 103 kg  Current weight: 93.4 kg   Non pitting edema: RUE, LUE, RLE, LLE  Nutritionally Relevant Medications: Scheduled Meds:  amLODipine   10 mg Oral Daily   artificial tears   Left Eye Q8H   Chlorhexidine  Gluconate Cloth  6 each Topical Daily   enoxaparin  (LOVENOX ) injection  50 mg Subcutaneous Daily   famotidine   20 mg Per Tube BID   feeding supplement (PROSource TF20)  60 mL Per Tube TID   free water   100 mL Per Tube Q4H   insulin  aspart  0-15 Units Subcutaneous Q4H   metoprolol  tartrate  100 mg Per Tube BID   multivitamin with minerals  1 tablet Per Tube Daily   nutrition supplement (JUVEN)  1 packet Per Tube BID BM   mouth rinse  15 mL Mouth Rinse 4 times per day   Continuous Infusions:  sodium chloride      cefTRIAXone (ROCEPHIN)  IV     feeding supplement (OSMOLITE 1.5 CAL) 1,000 mL (01/02/24 1753)   vancomycin  1,250 mg (01/02/24 2057)   Labs Reviewed: Sodium 134 calcium 8.7 AST 47 Alt 186 CBG ranges from 108-164 mg/dL over the last 24 hours HgbA1c 4.7  Diet Order:   Diet Order             Diet NPO time specified  Diet effective now                   EDUCATION NEEDS:   No education needs have been identified at this time  Skin:  Skin Assessment: Skin Integrity Issues: Skin Integrity Issues:: Other (Comment) Stage II: Stage 2, perineum Other: Non pressure wound, left eye  Last BM:  01/02/2024 type 6  Height:   Ht Readings from Last 1 Encounters:  11/22/23 5\' 8"  (1.727 m)    Weight:   Wt Readings from Last 1 Encounters:  01/02/24 93.4 kg    Ideal Body Weight:  70 kg  BMI:  Body mass index is 31.31 kg/m.  Estimated Nutritional Needs:   Kcal:  2000-2200  Protein:  125-140 grams  Fluid:  >2 L/day   Frederik Jansky, RD Registered Dietitian  See  Amion for more information

## 2024-01-04 DIAGNOSIS — I613 Nontraumatic intracerebral hemorrhage in brain stem: Secondary | ICD-10-CM | POA: Diagnosis not present

## 2024-01-04 LAB — CULTURE, RESPIRATORY W GRAM STAIN

## 2024-01-04 LAB — CBC
HCT: 33.3 % — ABNORMAL LOW (ref 39.0–52.0)
Hemoglobin: 10.8 g/dL — ABNORMAL LOW (ref 13.0–17.0)
MCH: 29.8 pg (ref 26.0–34.0)
MCHC: 32.4 g/dL (ref 30.0–36.0)
MCV: 91.7 fL (ref 80.0–100.0)
Platelets: 410 10*3/uL — ABNORMAL HIGH (ref 150–400)
RBC: 3.63 MIL/uL — ABNORMAL LOW (ref 4.22–5.81)
RDW: 13.9 % (ref 11.5–15.5)
WBC: 9.1 10*3/uL (ref 4.0–10.5)
nRBC: 0 % (ref 0.0–0.2)

## 2024-01-04 LAB — BASIC METABOLIC PANEL WITH GFR
Anion gap: 9 (ref 5–15)
BUN: 14 mg/dL (ref 6–20)
CO2: 23 mmol/L (ref 22–32)
Calcium: 8.5 mg/dL — ABNORMAL LOW (ref 8.9–10.3)
Chloride: 102 mmol/L (ref 98–111)
Creatinine, Ser: 0.61 mg/dL (ref 0.61–1.24)
GFR, Estimated: >60 mL/min (ref >60)
Glucose, Bld: 135 mg/dL — ABNORMAL HIGH (ref 70–99)
Potassium: 3.2 mmol/L — ABNORMAL LOW (ref 3.5–5.1)
Sodium: 134 mmol/L — ABNORMAL LOW (ref 135–145)

## 2024-01-04 LAB — GLUCOSE, CAPILLARY
Glucose-Capillary: 103 mg/dL — ABNORMAL HIGH (ref 70–99)
Glucose-Capillary: 110 mg/dL — ABNORMAL HIGH (ref 70–99)
Glucose-Capillary: 122 mg/dL — ABNORMAL HIGH (ref 70–99)
Glucose-Capillary: 133 mg/dL — ABNORMAL HIGH (ref 70–99)
Glucose-Capillary: 134 mg/dL — ABNORMAL HIGH (ref 70–99)
Glucose-Capillary: 141 mg/dL — ABNORMAL HIGH (ref 70–99)

## 2024-01-04 LAB — MAGNESIUM: Magnesium: 1.9 mg/dL (ref 1.7–2.4)

## 2024-01-04 MED ORDER — POTASSIUM CHLORIDE 20 MEQ PO PACK
60.0000 meq | PACK | Freq: Once | ORAL | Status: AC
  Administered 2024-01-04: 60 meq
  Filled 2024-01-04: qty 3

## 2024-01-04 NOTE — Progress Notes (Signed)
Spare trach ordered for trach change.

## 2024-01-04 NOTE — Plan of Care (Signed)
 Problem: Education: Goal: Knowledge of disease or condition will improve 01/04/2024 1716 by Rhona Cerise, RN Outcome: Progressing 01/04/2024 1716 by Rhona Cerise, RN Outcome: Progressing Goal: Knowledge of secondary prevention will improve (MUST DOCUMENT ALL) 01/04/2024 1716 by Rhona Cerise, RN Outcome: Progressing 01/04/2024 1716 by Rhona Cerise, RN Outcome: Progressing Goal: Knowledge of patient specific risk factors will improve (DELETE if not current risk factor) 01/04/2024 1716 by Rhona Cerise, RN Outcome: Progressing 01/04/2024 1716 by Rhona Cerise, RN Outcome: Progressing   Problem: Intracerebral Hemorrhage Tissue Perfusion: Goal: Complications of Intracerebral Hemorrhage will be minimized 01/04/2024 1716 by Rhona Cerise, RN Outcome: Progressing 01/04/2024 1716 by Rhona Cerise, RN Outcome: Progressing   Problem: Coping: Goal: Will verbalize positive feelings about self 01/04/2024 1716 by Rhona Cerise, RN Outcome: Progressing 01/04/2024 1716 by Rhona Cerise, RN Outcome: Progressing Goal: Will identify appropriate support needs 01/04/2024 1716 by Rhona Cerise, RN Outcome: Progressing 01/04/2024 1716 by Rhona Cerise, RN Outcome: Progressing   Problem: Health Behavior/Discharge Planning: Goal: Ability to manage health-related needs will improve 01/04/2024 1716 by Rhona Cerise, RN Outcome: Progressing 01/04/2024 1716 by Rhona Cerise, RN Outcome: Progressing Goal: Goals will be collaboratively established with patient/family 01/04/2024 1716 by Rhona Cerise, RN Outcome: Progressing 01/04/2024 1716 by Rhona Cerise, RN Outcome: Progressing   Problem: Self-Care: Goal: Ability to participate in self-care as condition permits will improve 01/04/2024 1716 by Rhona Cerise, RN Outcome: Progressing 01/04/2024 1716 by Rhona Cerise, RN Outcome: Progressing Goal: Verbalization of feelings and concerns over difficulty with self-care will improve 01/04/2024 1716 by Rhona Cerise, RN Outcome: Progressing 01/04/2024 1716 by Rhona Cerise, RN Outcome: Progressing Goal: Ability to communicate needs accurately will improve 01/04/2024 1716 by Rhona Cerise, RN Outcome: Progressing 01/04/2024 1716 by Rhona Cerise, RN Outcome: Progressing   Problem: Nutrition: Goal: Risk of aspiration will decrease 01/04/2024 1716 by Rhona Cerise, RN Outcome: Progressing 01/04/2024 1716 by Rhona Cerise, RN Outcome: Progressing Goal: Dietary intake will improve 01/04/2024 1716 by Rhona Cerise, RN Outcome: Progressing 01/04/2024 1716 by Rhona Cerise, RN Outcome: Progressing   Problem: Education: Goal: Ability to describe self-care measures that may prevent or decrease complications (Diabetes Survival Skills Education) will improve 01/04/2024 1716 by Rhona Cerise, RN Outcome: Progressing 01/04/2024 1716 by Rhona Cerise, RN Outcome: Progressing   Problem: Coping: Goal: Ability to adjust to condition or change in health will improve 01/04/2024 1716 by Rhona Cerise, RN Outcome: Progressing 01/04/2024 1716 by Rhona Cerise, RN Outcome: Progressing   Problem: Fluid Volume: Goal: Ability to maintain a balanced intake and output will improve 01/04/2024 1716 by Rhona Cerise, RN Outcome: Progressing 01/04/2024 1716 by Rhona Cerise, RN Outcome: Progressing   Problem: Health Behavior/Discharge Planning: Goal: Ability to identify and utilize available resources and services will improve 01/04/2024 1716 by Rhona Cerise, RN Outcome: Progressing 01/04/2024 1716 by Rhona Cerise, RN Outcome: Progressing Goal: Ability to manage health-related needs will improve 01/04/2024 1716 by Rhona Cerise, RN Outcome: Progressing 01/04/2024 1716 by Rhona Cerise, RN Outcome: Progressing   Problem: Metabolic: Goal: Ability to maintain appropriate glucose levels will improve 01/04/2024 1716 by Rhona Cerise, RN Outcome: Progressing 01/04/2024 1716 by Rhona Cerise, RN Outcome:  Progressing   Problem: Nutritional: Goal: Maintenance of adequate nutrition will improve 01/04/2024 1716 by Rhona Cerise, RN Outcome: Progressing 01/04/2024 1716 by Avanell Bob,  Ofelia Bent, RN Outcome: Progressing Goal: Progress toward achieving an optimal weight will improve 01/04/2024 1716 by Rhona Cerise, RN Outcome: Progressing 01/04/2024 1716 by Rhona Cerise, RN Outcome: Progressing   Problem: Skin Integrity: Goal: Risk for impaired skin integrity will decrease 01/04/2024 1716 by Rhona Cerise, RN Outcome: Progressing 01/04/2024 1716 by Rhona Cerise, RN Outcome: Progressing   Problem: Tissue Perfusion: Goal: Adequacy of tissue perfusion will improve 01/04/2024 1716 by Rhona Cerise, RN Outcome: Progressing 01/04/2024 1716 by Rhona Cerise, RN Outcome: Progressing   Problem: Education: Goal: Knowledge of General Education information will improve Description: Including pain rating scale, medication(s)/side effects and non-pharmacologic comfort measures 01/04/2024 1716 by Rhona Cerise, RN Outcome: Progressing 01/04/2024 1716 by Rhona Cerise, RN Outcome: Progressing   Problem: Health Behavior/Discharge Planning: Goal: Ability to manage health-related needs will improve 01/04/2024 1716 by Rhona Cerise, RN Outcome: Progressing 01/04/2024 1716 by Rhona Cerise, RN Outcome: Progressing   Problem: Clinical Measurements: Goal: Ability to maintain clinical measurements within normal limits will improve 01/04/2024 1716 by Rhona Cerise, RN Outcome: Progressing 01/04/2024 1716 by Rhona Cerise, RN Outcome: Progressing Goal: Will remain free from infection 01/04/2024 1716 by Rhona Cerise, RN Outcome: Progressing 01/04/2024 1716 by Rhona Cerise, RN Outcome: Progressing Goal: Diagnostic test results will improve 01/04/2024 1716 by Rhona Cerise, RN Outcome: Progressing 01/04/2024 1716 by Rhona Cerise, RN Outcome: Progressing Goal: Respiratory complications will improve 01/04/2024 1716  by Rhona Cerise, RN Outcome: Progressing 01/04/2024 1716 by Rhona Cerise, RN Outcome: Progressing Goal: Cardiovascular complication will be avoided 01/04/2024 1716 by Rhona Cerise, RN Outcome: Progressing 01/04/2024 1716 by Rhona Cerise, RN Outcome: Progressing   Problem: Activity: Goal: Risk for activity intolerance will decrease 01/04/2024 1716 by Rhona Cerise, RN Outcome: Progressing 01/04/2024 1716 by Rhona Cerise, RN Outcome: Progressing   Problem: Nutrition: Goal: Adequate nutrition will be maintained 01/04/2024 1716 by Rhona Cerise, RN Outcome: Progressing 01/04/2024 1716 by Rhona Cerise, RN Outcome: Progressing   Problem: Coping: Goal: Level of anxiety will decrease 01/04/2024 1716 by Rhona Cerise, RN Outcome: Progressing 01/04/2024 1716 by Rhona Cerise, RN Outcome: Progressing   Problem: Elimination: Goal: Will not experience complications related to bowel motility 01/04/2024 1716 by Rhona Cerise, RN Outcome: Progressing 01/04/2024 1716 by Rhona Cerise, RN Outcome: Progressing Goal: Will not experience complications related to urinary retention 01/04/2024 1716 by Rhona Cerise, RN Outcome: Progressing 01/04/2024 1716 by Rhona Cerise, RN Outcome: Progressing   Problem: Pain Managment: Goal: General experience of comfort will improve and/or be controlled 01/04/2024 1716 by Rhona Cerise, RN Outcome: Progressing 01/04/2024 1716 by Rhona Cerise, RN Outcome: Progressing   Problem: Safety: Goal: Ability to remain free from injury will improve 01/04/2024 1716 by Rhona Cerise, RN Outcome: Progressing 01/04/2024 1716 by Rhona Cerise, RN Outcome: Progressing   Problem: Skin Integrity: Goal: Risk for impaired skin integrity will decrease 01/04/2024 1716 by Rhona Cerise, RN Outcome: Progressing 01/04/2024 1716 by Rhona Cerise, RN Outcome: Progressing   Problem: Education: Goal: 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 01/04/2024 1716 by  Rhona Cerise, RN Outcome: Progressing 01/04/2024 1716 by Rhona Cerise, RN Outcome: Progressing   Problem: Activity: Goal: Ability to tolerate increased activity will improve 01/04/2024 1716 by Rhona Cerise, RN Outcome: Progressing 01/04/2024 1716 by Rhona Cerise, RN Outcome: Progressing   Problem: Health Behavior/Discharge Planning:  Goal: Ability to manage tracheostomy will improve 01/04/2024 1716 by Rhona Cerise, RN Outcome: Progressing 01/04/2024 1716 by Rhona Cerise, RN Outcome: Progressing   Problem: Respiratory: Goal: Patent airway maintenance will improve 01/04/2024 1716 by Rhona Cerise, RN Outcome: Progressing 01/04/2024 1716 by Rhona Cerise, RN Outcome: Progressing   Problem: Role Relationship: Goal: Ability to communicate will improve 01/04/2024 1716 by Rhona Cerise, RN Outcome: Progressing 01/04/2024 1716 by Rhona Cerise, RN Outcome: Progressing

## 2024-01-04 NOTE — Progress Notes (Signed)
 [facility] Progress Note Patient: Garrett [name] ZOX:[id number] DOB: 06-Dec-1975 DOA: 10/27/2023  DOS: the patient was seen and examined on 01/04/2024  Brief Hospital Course: Patient is a 48 year old male with no known medical history.  Patient found down on 10/27/2023 unresponsive with UDS positive for opiates, minimal responsiveness to Narcan .  Found to be markedly hypertensive at 260/156.  CT head at intake shows 4.1 cm intracranial hemorrhage at the pons with intraventricular extension into the fourth ventricle and basal cisterns.  Initially admitted by critical care, now extubated to tracheostomy which was placed 3/22.  PEG placed 3/24.  Disposition remains difficult given patient's needs with PEG and tracheostomy tube.   Major events: 3/8: Intubated in ED, pontine bleed/SAH. CT head Acute large hemorrhage 4.1 cm in pons with intraventricular extension without hydrocephalus 3/9: CTA No change in IPH in pons, similar biparietal [facility] 3/14: Now awake, appears locked in. can follow commands with eyes. 3/16 Purposeful with left upper extremity  3/22 tracheostomy placed at bedside, bleeding issues overnight from trach site 3/24 PEG (Dr. Hildy Lowers) 3/24 sputum culture with MRSA 4/20: trach was changed to cuffless #8 5/1: Mental status appears to be somewhat improving off narcotics, more awake alert following simple commands(able to grimace, move left toes, track vertically with right eye).  Speech following. Trach downsized to [employer] cuffless #6 XLT 5/4 -held modafinil , low threshold to resume if mental status/ability to interact diminishes 5/10 PEG dislodged, foley bulb placed and IR consulted to replace tube 5/11 IR replaced PEG tube 5/13 and 5/14 fever.  Back on antibiotic.  Urine culture growing E. coli.    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 Last CT scan 3/29 with reduction in size of the hemorrhage. Echo EF 60-65%, A1c 4.7, LDL 96 UDS positive for opioids only. Treated with 3% hypertonic saline. 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  Multiple tracheal aspirate has been growing MRSA. Treated with vancomycin  and then later on was switched to Zyvox  due to persistent fever with recurrent aspiration.  Febrile again on 5/13. Possible E. coli UTI Urine culture is growing E. coli. Started on ceftriaxone.  Later on was on cefepime  and vancomycin . Procalcitonin is elevated. Fever curve trending down. E. coli is pansensitive. Continue ceftriaxone. Will discontinue vancomycin  and observe. Treated with Zyvox  in the past due to persistent fever despite being on vancomycin .  Moderate to severe protein caloric malnutrition, POA Currently on tube feeding and PEG dependent.  PEG tube malfunction.  Resolved. 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 Prior removals/voiding trial unsuccessful Last catheter change was on 5/13.  Macrocytic anemia thrombocytosis Overall  stable/resolved Perineal skin injury, POA -  Continue Gerhard's Butt cream Goals of care conversation.  Switch to DNR on 3/13. Relative B12 and folate deficiency. Current replacement with MVI   Subjective: No acute events.  Fever curve improving.  Physical Exam: General: in Mild distress, No Rash Cardiovascular: S1 and S2 Present, No Murmur Respiratory: Good respiratory effort, Bilateral Air entry present.  Improving upper airway crackles, No wheezes Abdomen: Bowel Sound present, No tenderness Extremities: No edema Neuro: Able to communicate with eye blink.  Nonverbal.  Data Reviewed: I have Reviewed nursing notes, Vitals, and Lab results. Since last encounter, pertinent lab results CBC and BMP   . I have ordered test including CBC and BMP  .   Disposition: Status is: Inpatient Remains inpatient appropriate because: Needing IV antibiotic and placement.  SCDs Start: 10/27/23 2226   Family Communication: Unable to reach mother multiple times. Level of care: Progressive   Vitals:   01/04/24 1138 01/04/24 1232 01/04/24 1600 01/04/24 1710  BP: (!) 126/92  (!) 128/93   Pulse: 95 93 (!) 102 (!) 101  Resp: (!) 28  14 (!) 25  Temp: 98.8 F (37.1 C)  99.5 F (37.5 C)   TempSrc: Axillary  Axillary   SpO2: 98%  99% 98%  Weight:      Height:         Author: Charlean Congress, MD 01/04/2024 5:55 PM  Please look on [email or web address] to find out who is on call.

## 2024-01-05 DIAGNOSIS — I613 Nontraumatic intracerebral hemorrhage in brain stem: Secondary | ICD-10-CM | POA: Diagnosis not present

## 2024-01-05 LAB — CBC
HCT: 35.3 % — ABNORMAL LOW (ref 39.0–52.0)
Hemoglobin: 11.4 g/dL — ABNORMAL LOW (ref 13.0–17.0)
MCH: 29.8 pg (ref 26.0–34.0)
MCHC: 32.3 g/dL (ref 30.0–36.0)
MCV: 92.4 fL (ref 80.0–100.0)
Platelets: 415 10*3/uL — ABNORMAL HIGH (ref 150–400)
RBC: 3.82 MIL/uL — ABNORMAL LOW (ref 4.22–5.81)
RDW: 13.9 % (ref 11.5–15.5)
WBC: 6.6 10*3/uL (ref 4.0–10.5)
nRBC: 0 % (ref 0.0–0.2)

## 2024-01-05 LAB — BASIC METABOLIC PANEL WITH GFR
Anion gap: 12 (ref 5–15)
BUN: 19 mg/dL (ref 6–20)
CO2: 23 mmol/L (ref 22–32)
Calcium: 8.9 mg/dL (ref 8.9–10.3)
Chloride: 100 mmol/L (ref 98–111)
Creatinine, Ser: 0.53 mg/dL — ABNORMAL LOW (ref 0.61–1.24)
GFR, Estimated: >60 mL/min (ref >60)
Glucose, Bld: 141 mg/dL — ABNORMAL HIGH (ref 70–99)
Potassium: 3.8 mmol/L (ref 3.5–5.1)
Sodium: 135 mmol/L (ref 135–145)

## 2024-01-05 LAB — GLUCOSE, CAPILLARY
Glucose-Capillary: 104 mg/dL — ABNORMAL HIGH (ref 70–99)
Glucose-Capillary: 122 mg/dL — ABNORMAL HIGH (ref 70–99)
Glucose-Capillary: 139 mg/dL — ABNORMAL HIGH (ref 70–99)
Glucose-Capillary: 138 mg/dL — ABNORMAL HIGH (ref 70–99)
Glucose-Capillary: 145 mg/dL — ABNORMAL HIGH (ref 70–99)

## 2024-01-05 LAB — MAGNESIUM: Magnesium: 2 mg/dL (ref 1.7–2.4)

## 2024-01-05 MED ORDER — AMLODIPINE BESYLATE 10 MG PO TABS
10.0000 mg | ORAL_TABLET | Freq: Every day | ORAL | Status: DC
  Administered 2024-01-05 – 2024-02-25 (×52): 10 mg
  Filled 2024-01-05 (×2): qty 2
  Filled 2024-01-05: qty 4
  Filled 2024-01-05: qty 2
  Filled 2024-01-05: qty 1
  Filled 2024-01-05 (×5): qty 2
  Filled 2024-01-05: qty 1
  Filled 2024-01-05 (×14): qty 2
  Filled 2024-01-05: qty 1
  Filled 2024-01-05 (×8): qty 2
  Filled 2024-01-05: qty 1
  Filled 2024-01-05 (×16): qty 2

## 2024-01-05 NOTE — Progress Notes (Signed)
 RT note. Trach changed at this time by x2 RT at bedside, good color change noted. RT will continue to monitor.    01/05/24 1610  Therapy Vitals  Pulse Rate 92  Resp 20  MEWS Score/Color  MEWS Score 0  MEWS Score Color Green  Respiratory Assessment  Assessment Type Assess only  Respiratory Pattern Regular;Unlabored  Chest Assessment Chest expansion symmetrical  Bilateral Breath Sounds Clear;Diminished  Oxygen Therapy/Pulse Ox  O2 Device Tracheostomy Collar  O2 Therapy Room air humidified  O2 Flow Rate (L/min) 6 L/min  FiO2 (%) 21 %  SpO2 98 %  Tracheostomy Shiley XLT Distal 6 mm Uncuffed  Placement Date/Time: 01/05/24 0830   Placed By: Self  Brand: Shiley XLT Distal  Size (mm): 6 mm  Style: Uncuffed  Status Secured with trach ties  Site Assessment Clean;Dry  Site Care Dressing applied  Inner Cannula Care Changed/new  Ties Assessment Clean, Dry;Changed  Trach Changed (S)  Yes  Tracheostomy Equipment at bedside Yes and checklist posted at head of bed  Respiratory  Airway LDA Tracheostomy

## 2024-01-05 NOTE — Plan of Care (Signed)
 Patient remains non verbal, unable to communicate.

## 2024-01-05 NOTE — Plan of Care (Signed)
 Problem: Education: Goal: Knowledge of disease or condition will improve 01/05/2024 0414 by Alejos Husband, RN Outcome: Progressing 01/05/2024 0037 by Alejos Husband, RN Outcome: Progressing Goal: Knowledge of secondary prevention will improve (MUST DOCUMENT ALL) 01/05/2024 0414 by Alejos Husband, RN Outcome: Progressing 01/05/2024 0037 by Alejos Husband, RN Outcome: Progressing Goal: Knowledge of patient specific risk factors will improve (DELETE if not current risk factor) 01/05/2024 0414 by Alejos Husband, RN Outcome: Progressing 01/05/2024 0037 by Alejos Husband, RN Outcome: Progressing   Problem: Intracerebral Hemorrhage Tissue Perfusion: Goal: Complications of Intracerebral Hemorrhage will be minimized 01/05/2024 0414 by Alejos Husband, RN Outcome: Progressing 01/05/2024 0037 by Alejos Husband, RN Outcome: Progressing   Problem: Coping: Goal: Will verbalize positive feelings about self 01/05/2024 0414 by Alejos Husband, RN Outcome: Progressing 01/05/2024 0037 by Alejos Husband, RN Outcome: Progressing Goal: Will identify appropriate support needs 01/05/2024 0414 by Alejos Husband, RN Outcome: Progressing 01/05/2024 0037 by Alejos Husband, RN Outcome: Progressing   Problem: Health Behavior/Discharge Planning: Goal: Ability to manage health-related needs will improve 01/05/2024 0414 by Alejos Husband, RN Outcome: Progressing 01/05/2024 0037 by Alejos Husband, RN Outcome: Progressing Goal: Goals will be collaboratively established with patient/family 01/05/2024 0414 by Alejos Husband, RN Outcome: Progressing 01/05/2024 0037 by Alejos Husband, RN Outcome: Progressing   Problem: Self-Care: Goal: Ability to participate in self-care as condition permits will improve 01/05/2024 0414 by Alejos Husband, RN Outcome: Progressing 01/05/2024 0037 by Alejos Husband, RN Outcome: Progressing Goal: Verbalization of  feelings and concerns over difficulty with self-care will improve 01/05/2024 0414 by Alejos Husband, RN Outcome: Progressing 01/05/2024 0037 by Alejos Husband, RN Outcome: Progressing Goal: Ability to communicate needs accurately will improve 01/05/2024 0414 by Alejos Husband, RN Outcome: Progressing 01/05/2024 0037 by Alejos Husband, RN Outcome: Progressing   Problem: Nutrition: Goal: Risk of aspiration will decrease 01/05/2024 0414 by Alejos Husband, RN Outcome: Progressing 01/05/2024 0037 by Alejos Husband, RN Outcome: Progressing Goal: Dietary intake will improve 01/05/2024 0414 by Alejos Husband, RN Outcome: Progressing 01/05/2024 0037 by Alejos Husband, RN Outcome: Progressing   Problem: Education: Goal: Ability to describe self-care measures that may prevent or decrease complications (Diabetes Survival Skills Education) will improve 01/05/2024 0414 by Alejos Husband, RN Outcome: Progressing 01/05/2024 0037 by Alejos Husband, RN Outcome: Progressing   Problem: Coping: Goal: Ability to adjust to condition or change in health will improve 01/05/2024 0414 by Alejos Husband, RN Outcome: Progressing 01/05/2024 0037 by Alejos Husband, RN Outcome: Progressing   Problem: Fluid Volume: Goal: Ability to maintain a balanced intake and output will improve 01/05/2024 0414 by Alejos Husband, RN Outcome: Progressing 01/05/2024 0037 by Alejos Husband, RN Outcome: Progressing   Problem: Health Behavior/Discharge Planning: Goal: Ability to identify and utilize available resources and services will improve 01/05/2024 0414 by Alejos Husband, RN Outcome: Progressing 01/05/2024 0037 by Alejos Husband, RN Outcome: Progressing Goal: Ability to manage health-related needs will improve 01/05/2024 0414 by Alejos Husband, RN Outcome: Progressing 01/05/2024 0037 by Alejos Husband, RN Outcome: Progressing   Problem:  Metabolic: Goal: Ability to maintain appropriate glucose levels will improve 01/05/2024 0414 by Alejos Husband, RN Outcome: Progressing 01/05/2024 0037 by Alejos Husband, RN Outcome: Progressing   Problem: Nutritional: Goal: Maintenance of adequate nutrition will improve 01/05/2024 0414 by Alejos Husband, RN Outcome: Progressing 01/05/2024 0037 by Darleene Ege  Dyanna Glasgow, RN Outcome: Progressing Goal: Progress toward achieving an optimal weight will improve 01/05/2024 0414 by Alejos Husband, RN Outcome: Progressing 01/05/2024 0037 by Alejos Husband, RN Outcome: Progressing   Problem: Skin Integrity: Goal: Risk for impaired skin integrity will decrease 01/05/2024 0414 by Alejos Husband, RN Outcome: Progressing 01/05/2024 0037 by Alejos Husband, RN Outcome: Progressing   Problem: Tissue Perfusion: Goal: Adequacy of tissue perfusion will improve 01/05/2024 0414 by Alejos Husband, RN Outcome: Progressing 01/05/2024 0037 by Alejos Husband, RN Outcome: Progressing   Problem: Education: Goal: Knowledge of General Education information will improve Description: Including pain rating scale, medication(s)/side effects and non-pharmacologic comfort measures 01/05/2024 0414 by Alejos Husband, RN Outcome: Progressing 01/05/2024 0037 by Alejos Husband, RN Outcome: Progressing   Problem: Health Behavior/Discharge Planning: Goal: Ability to manage health-related needs will improve 01/05/2024 0414 by Alejos Husband, RN Outcome: Progressing 01/05/2024 0037 by Alejos Husband, RN Outcome: Progressing   Problem: Clinical Measurements: Goal: Ability to maintain clinical measurements within normal limits will improve 01/05/2024 0414 by Alejos Husband, RN Outcome: Progressing 01/05/2024 0037 by Alejos Husband, RN Outcome: Progressing Goal: Will remain free from infection 01/05/2024 0414 by Alejos Husband, RN Outcome: Progressing 01/05/2024  0037 by Alejos Husband, RN Outcome: Progressing Goal: Diagnostic test results will improve 01/05/2024 0414 by Alejos Husband, RN Outcome: Progressing 01/05/2024 0037 by Alejos Husband, RN Outcome: Progressing Goal: Respiratory complications will improve 01/05/2024 0414 by Alejos Husband, RN Outcome: Progressing 01/05/2024 0037 by Alejos Husband, RN Outcome: Progressing Goal: Cardiovascular complication will be avoided 01/05/2024 0414 by Alejos Husband, RN Outcome: Progressing 01/05/2024 0037 by Alejos Husband, RN Outcome: Progressing   Problem: Activity: Goal: Risk for activity intolerance will decrease 01/05/2024 0414 by Alejos Husband, RN Outcome: Progressing 01/05/2024 0037 by Alejos Husband, RN Outcome: Progressing   Problem: Nutrition: Goal: Adequate nutrition will be maintained 01/05/2024 0414 by Alejos Husband, RN Outcome: Progressing 01/05/2024 0037 by Alejos Husband, RN Outcome: Progressing   Problem: Coping: Goal: Level of anxiety will decrease 01/05/2024 0414 by Alejos Husband, RN Outcome: Progressing 01/05/2024 0037 by Alejos Husband, RN Outcome: Progressing   Problem: Elimination: Goal: Will not experience complications related to bowel motility 01/05/2024 0414 by Alejos Husband, RN Outcome: Progressing 01/05/2024 0037 by Alejos Husband, RN Outcome: Progressing Goal: Will not experience complications related to urinary retention 01/05/2024 0414 by Alejos Husband, RN Outcome: Progressing 01/05/2024 0037 by Alejos Husband, RN Outcome: Progressing   Problem: Pain Managment: Goal: General experience of comfort will improve and/or be controlled 01/05/2024 0414 by Alejos Husband, RN Outcome: Progressing 01/05/2024 0037 by Alejos Husband, RN Outcome: Progressing   Problem: Safety: Goal: Ability to remain free from injury will improve 01/05/2024 0414 by Alejos Husband, RN Outcome:  Progressing 01/05/2024 0037 by Alejos Husband, RN Outcome: Progressing   Problem: Skin Integrity: Goal: Risk for impaired skin integrity will decrease 01/05/2024 0414 by Alejos Husband, RN Outcome: Progressing 01/05/2024 0037 by Alejos Husband, RN Outcome: Progressing   Problem: Education: Goal: 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 01/05/2024 0414 by Alejos Husband, RN Outcome: Progressing 01/05/2024 0037 by Alejos Husband, RN Outcome: Progressing   Problem: Activity: Goal: Ability to tolerate increased activity will improve 01/05/2024 0414 by Alejos Husband, RN Outcome: Progressing 01/05/2024 0037 by Alejos Husband, RN Outcome: Progressing   Problem: Health Behavior/Discharge Planning:  Goal: Ability to manage tracheostomy will improve 01/05/2024 0414 by Alejos Husband, RN Outcome: Progressing 01/05/2024 0037 by Alejos Husband, RN Outcome: Progressing   Problem: Respiratory: Goal: Patent airway maintenance will improve 01/05/2024 0414 by Alejos Husband, RN Outcome: Progressing 01/05/2024 0037 by Alejos Husband, RN Outcome: Progressing   Problem: Role Relationship: Goal: Ability to communicate will improve 01/05/2024 0414 by Alejos Husband, RN Outcome: Progressing 01/05/2024 0037 by Alejos Husband, RN Outcome: Progressing

## 2024-01-05 NOTE — Progress Notes (Signed)
 [facility] Progress Note Patient: Garrett [name] ZHY:[id number] DOB: 14-Apr-1976 DOA: 10/27/2023  DOS: the patient was seen and examined on 01/05/2024  Brief Hospital Course: Patient is a 47 year old male with no known medical history.  Patient found down on 10/27/2023 unresponsive with UDS positive for opiates, minimal responsiveness to Narcan .  Found to be markedly hypertensive at 260/156.  CT head at intake shows 4.1 cm intracranial hemorrhage at the pons with intraventricular extension into the fourth ventricle and basal cisterns.  Initially admitted by critical care, now extubated to tracheostomy which was placed 3/22.  PEG placed 3/24.  Disposition remains difficult given patient's needs with PEG and tracheostomy tube.   Major events: 3/8: Intubated in ED, pontine bleed/SAH. CT head Acute large hemorrhage 4.1 cm in pons with intraventricular extension without hydrocephalus 3/9: CTA No change in IPH in pons, similar biparietal [facility] 3/14: Now awake, appears locked in. can follow commands with eyes. 3/16 Purposeful with left upper extremity  3/22 tracheostomy placed at bedside, bleeding issues overnight from trach site 3/24 PEG (Dr. Hildy Lowers) 3/24 sputum culture with MRSA 4/20: trach was changed to cuffless #8 5/1: Mental status appears to be somewhat improving off narcotics, more awake alert following simple commands(able to grimace, move left toes, track vertically with right eye).  Speech following. Trach downsized to [employer] cuffless #6 XLT 5/4 -held modafinil , low threshold to resume if mental status/ability to interact diminishes 5/10 PEG dislodged, foley bulb placed and IR consulted to replace tube 5/11 IR replaced PEG tube 5/13 and 5/14 fever.  Back on antibiotic.  Urine culture growing E. coli.  Foley catheter changed. 5/17 cultures are finalized.  Tracheostomy changed.  Currently on ceftriaxone  only.   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 Last CT scan 3/29 with reduction in size of the hemorrhage. Echo EF 60-65%, A1c 4.7, LDL 96 UDS positive for opioids only. Treated with 3% hypertonic saline. 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  Multiple tracheal aspirate has been growing MRSA. Treated with vancomycin  and then later on was switched to Zyvox  due to persistent fever with recurrent aspiration.  Febrile again on 5/13. Possible E. coli UTI Urine culture is growing E. coli. Started on ceftriaxone .  Later on was on cefepime  and vancomycin . Procalcitonin is elevated. Fever curve trending down. E. coli is pansensitive. Continue ceftriaxone . Will discontinue vancomycin  and observe.  Moderate to severe protein caloric malnutrition, POA Currently on tube feeding and PEG dependent.  PEG tube malfunction.  Resolved. 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 Prior removals/voiding trial unsuccessful Last catheter change was on 5/13.  Macrocytic anemia  thrombocytosis Overall stable/resolved Perineal skin injury, POA -  Continue Gerhard's Butt cream Goals of care conversation.  Switch to DNR on 3/13. Relative B12 and folate deficiency. Current replacement with MVI   Subjective: Able to follow commands or nonverbal.  No other acute events overnight.  Physical Exam: Upper airway secretion crackles heard. No distress. S1-S2 present. Bowel sound present. No edema of the lower extremities. Able to squeeze fingers with left upper extremity.  Data Reviewed: I have Reviewed nursing notes, Vitals, and Lab results. Since last encounter, pertinent lab results CBC and BMP   .   Disposition: Status is: Inpatient Remains inpatient appropriate because: Medically stable now.  Requiring placement.  SCDs Start: 10/27/23 2226   Family Communication: Discussed with mother on the phone Level of care: Progressive   Vitals:   01/05/24 0500 01/05/24 0758 01/05/24 0828 01/05/24 1135  BP:  (!) 145/97  (!) 135/94  Pulse:  94 92 81  Resp:  16 20 16   Temp:  98.4 F (36.9 C)  98.7 F (37.1 C)  TempSrc:  Axillary  Axillary  SpO2:  98% 98% 92%  Weight: 93.4 kg     Height:         Author: Charlean Congress, MD 01/05/2024 2:40 PM  Please look on [email or web address] to find out who is on call.

## 2024-01-05 NOTE — Plan of Care (Signed)
 Problem: Education: Goal: Knowledge of disease or condition will improve 01/05/2024 2232 by Alejos Husband, RN Outcome: Progressing 01/05/2024 2033 by Alejos Husband, RN Outcome: Progressing Goal: Knowledge of secondary prevention will improve (MUST DOCUMENT ALL) 01/05/2024 2232 by Alejos Husband, RN Outcome: Progressing 01/05/2024 2033 by Alejos Husband, RN Outcome: Progressing Goal: Knowledge of patient specific risk factors will improve (DELETE if not current risk factor) 01/05/2024 2232 by Alejos Husband, RN Outcome: Progressing 01/05/2024 2033 by Alejos Husband, RN Outcome: Progressing   Problem: Intracerebral Hemorrhage Tissue Perfusion: Goal: Complications of Intracerebral Hemorrhage will be minimized 01/05/2024 2232 by Alejos Husband, RN Outcome: Progressing 01/05/2024 2033 by Alejos Husband, RN Outcome: Progressing   Problem: Coping: Goal: Will verbalize positive feelings about self 01/05/2024 2232 by Alejos Husband, RN Outcome: Progressing 01/05/2024 2033 by Alejos Husband, RN Outcome: Progressing Goal: Will identify appropriate support needs 01/05/2024 2232 by Alejos Husband, RN Outcome: Progressing 01/05/2024 2033 by Alejos Husband, RN Outcome: Progressing   Problem: Health Behavior/Discharge Planning: Goal: Ability to manage health-related needs will improve 01/05/2024 2232 by Alejos Husband, RN Outcome: Progressing 01/05/2024 2033 by Alejos Husband, RN Outcome: Progressing Goal: Goals will be collaboratively established with patient/family 01/05/2024 2232 by Alejos Husband, RN Outcome: Progressing 01/05/2024 2033 by Alejos Husband, RN Outcome: Progressing   Problem: Self-Care: Goal: Ability to participate in self-care as condition permits will improve 01/05/2024 2232 by Alejos Husband, RN Outcome: Progressing 01/05/2024 2033 by Alejos Husband, RN Outcome: Progressing Goal: Verbalization of  feelings and concerns over difficulty with self-care will improve 01/05/2024 2232 by Alejos Husband, RN Outcome: Progressing 01/05/2024 2033 by Alejos Husband, RN Outcome: Progressing Goal: Ability to communicate needs accurately will improve 01/05/2024 2232 by Alejos Husband, RN Outcome: Progressing 01/05/2024 2033 by Alejos Husband, RN Outcome: Progressing   Problem: Nutrition: Goal: Risk of aspiration will decrease 01/05/2024 2232 by Alejos Husband, RN Outcome: Progressing 01/05/2024 2033 by Alejos Husband, RN Outcome: Progressing Goal: Dietary intake will improve 01/05/2024 2232 by Alejos Husband, RN Outcome: Progressing 01/05/2024 2033 by Alejos Husband, RN Outcome: Progressing   Problem: Education: Goal: Ability to describe self-care measures that may prevent or decrease complications (Diabetes Survival Skills Education) will improve 01/05/2024 2232 by Alejos Husband, RN Outcome: Progressing 01/05/2024 2033 by Alejos Husband, RN Outcome: Progressing   Problem: Coping: Goal: Ability to adjust to condition or change in health will improve 01/05/2024 2232 by Alejos Husband, RN Outcome: Progressing 01/05/2024 2033 by Alejos Husband, RN Outcome: Progressing   Problem: Fluid Volume: Goal: Ability to maintain a balanced intake and output will improve 01/05/2024 2232 by Alejos Husband, RN Outcome: Progressing 01/05/2024 2033 by Alejos Husband, RN Outcome: Progressing   Problem: Health Behavior/Discharge Planning: Goal: Ability to identify and utilize available resources and services will improve 01/05/2024 2232 by Alejos Husband, RN Outcome: Progressing 01/05/2024 2033 by Alejos Husband, RN Outcome: Progressing Goal: Ability to manage health-related needs will improve 01/05/2024 2232 by Alejos Husband, RN Outcome: Progressing 01/05/2024 2033 by Alejos Husband, RN Outcome: Progressing   Problem:  Metabolic: Goal: Ability to maintain appropriate glucose levels will improve 01/05/2024 2232 by Alejos Husband, RN Outcome: Progressing 01/05/2024 2033 by Alejos Husband, RN Outcome: Progressing   Problem: Nutritional: Goal: Maintenance of adequate nutrition will improve 01/05/2024 2232 by Alejos Husband, RN Outcome: Progressing 01/05/2024 2033 by Darleene Ege,  Dyanna Glasgow, RN Outcome: Progressing Goal: Progress toward achieving an optimal weight will improve 01/05/2024 2232 by Alejos Husband, RN Outcome: Progressing 01/05/2024 2033 by Alejos Husband, RN Outcome: Progressing   Problem: Skin Integrity: Goal: Risk for impaired skin integrity will decrease 01/05/2024 2232 by Alejos Husband, RN Outcome: Progressing 01/05/2024 2033 by Alejos Husband, RN Outcome: Progressing   Problem: Tissue Perfusion: Goal: Adequacy of tissue perfusion will improve 01/05/2024 2232 by Alejos Husband, RN Outcome: Progressing 01/05/2024 2033 by Alejos Husband, RN Outcome: Progressing   Problem: Education: Goal: Knowledge of General Education information will improve Description: Including pain rating scale, medication(s)/side effects and non-pharmacologic comfort measures 01/05/2024 2232 by Alejos Husband, RN Outcome: Progressing 01/05/2024 2033 by Alejos Husband, RN Outcome: Progressing   Problem: Health Behavior/Discharge Planning: Goal: Ability to manage health-related needs will improve 01/05/2024 2232 by Alejos Husband, RN Outcome: Progressing 01/05/2024 2033 by Alejos Husband, RN Outcome: Progressing   Problem: Clinical Measurements: Goal: Ability to maintain clinical measurements within normal limits will improve 01/05/2024 2232 by Alejos Husband, RN Outcome: Progressing 01/05/2024 2033 by Alejos Husband, RN Outcome: Progressing Goal: Will remain free from infection 01/05/2024 2232 by Alejos Husband, RN Outcome: Progressing 01/05/2024  2033 by Alejos Husband, RN Outcome: Progressing Goal: Diagnostic test results will improve 01/05/2024 2232 by Alejos Husband, RN Outcome: Progressing 01/05/2024 2033 by Alejos Husband, RN Outcome: Progressing Goal: Respiratory complications will improve 01/05/2024 2232 by Alejos Husband, RN Outcome: Progressing 01/05/2024 2033 by Alejos Husband, RN Outcome: Progressing Goal: Cardiovascular complication will be avoided 01/05/2024 2232 by Alejos Husband, RN Outcome: Progressing 01/05/2024 2033 by Alejos Husband, RN Outcome: Progressing   Problem: Activity: Goal: Risk for activity intolerance will decrease 01/05/2024 2232 by Alejos Husband, RN Outcome: Progressing 01/05/2024 2033 by Alejos Husband, RN Outcome: Progressing   Problem: Nutrition: Goal: Adequate nutrition will be maintained 01/05/2024 2232 by Alejos Husband, RN Outcome: Progressing 01/05/2024 2033 by Alejos Husband, RN Outcome: Progressing   Problem: Coping: Goal: Level of anxiety will decrease 01/05/2024 2232 by Alejos Husband, RN Outcome: Progressing 01/05/2024 2033 by Alejos Husband, RN Outcome: Progressing   Problem: Elimination: Goal: Will not experience complications related to bowel motility 01/05/2024 2232 by Alejos Husband, RN Outcome: Progressing 01/05/2024 2033 by Alejos Husband, RN Outcome: Progressing Goal: Will not experience complications related to urinary retention 01/05/2024 2232 by Alejos Husband, RN Outcome: Progressing 01/05/2024 2033 by Alejos Husband, RN Outcome: Progressing   Problem: Pain Managment: Goal: General experience of comfort will improve and/or be controlled 01/05/2024 2232 by Alejos Husband, RN Outcome: Progressing 01/05/2024 2033 by Alejos Husband, RN Outcome: Progressing   Problem: Safety: Goal: Ability to remain free from injury will improve 01/05/2024 2232 by Alejos Husband, RN Outcome:  Progressing 01/05/2024 2033 by Alejos Husband, RN Outcome: Progressing   Problem: Skin Integrity: Goal: Risk for impaired skin integrity will decrease 01/05/2024 2232 by Alejos Husband, RN Outcome: Progressing 01/05/2024 2033 by Alejos Husband, RN Outcome: Progressing   Problem: Education: Goal: 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 01/05/2024 2232 by Alejos Husband, RN Outcome: Progressing 01/05/2024 2033 by Alejos Husband, RN Outcome: Progressing   Problem: Activity: Goal: Ability to tolerate increased activity will improve 01/05/2024 2232 by Alejos Husband, RN Outcome: Progressing 01/05/2024 2033 by Alejos Husband, RN Outcome: Progressing   Problem: Health Behavior/Discharge Planning:  Goal: Ability to manage tracheostomy will improve 01/05/2024 2232 by Alejos Husband, RN Outcome: Progressing 01/05/2024 2033 by Alejos Husband, RN Outcome: Progressing   Problem: Respiratory: Goal: Patent airway maintenance will improve 01/05/2024 2232 by Alejos Husband, RN Outcome: Progressing 01/05/2024 2033 by Alejos Husband, RN Outcome: Progressing   Problem: Role Relationship: Goal: Ability to communicate will improve 01/05/2024 2232 by Alejos Husband, RN Outcome: Progressing 01/05/2024 2033 by Alejos Husband, RN Outcome: Progressing

## 2024-01-06 DIAGNOSIS — I613 Nontraumatic intracerebral hemorrhage in brain stem: Secondary | ICD-10-CM | POA: Diagnosis not present

## 2024-01-06 LAB — CULTURE, BLOOD (ROUTINE X 2)
Culture: NO GROWTH
Culture: NO GROWTH
Special Requests: ADEQUATE
Special Requests: ADEQUATE

## 2024-01-06 LAB — GLUCOSE, CAPILLARY
Glucose-Capillary: 95 mg/dL (ref 70–99)
Glucose-Capillary: 140 mg/dL — ABNORMAL HIGH (ref 70–99)
Glucose-Capillary: 146 mg/dL — ABNORMAL HIGH (ref 70–99)
Glucose-Capillary: 101 mg/dL — ABNORMAL HIGH (ref 70–99)
Glucose-Capillary: 111 mg/dL — ABNORMAL HIGH (ref 70–99)
Glucose-Capillary: 133 mg/dL — ABNORMAL HIGH (ref 70–99)
Glucose-Capillary: 101 mg/dL — ABNORMAL HIGH (ref 70–99)

## 2024-01-06 NOTE — Progress Notes (Signed)
 [facility] Progress Note Patient: Garrett [name] ZOX:[id number] DOB: 1976/05/15 DOA: 10/27/2023  DOS: the patient was seen and examined on 01/06/2024  Brief Hospital Course: Patient is a 48 year old male with no known medical history.  Patient found down on 10/27/2023 unresponsive with UDS positive for opiates, minimal responsiveness to Narcan .  Found to be markedly hypertensive at 260/156.  CT head at intake shows 4.1 cm intracranial hemorrhage at the pons with intraventricular extension into the fourth ventricle and basal cisterns.  Initially admitted by critical care, now extubated to tracheostomy which was placed 3/22.  PEG placed 3/24.  Disposition remains difficult given patient's needs with PEG and tracheostomy tube.   Major events: 3/8: Intubated in ED, pontine bleed/SAH. CT head Acute large hemorrhage 4.1 cm in pons with intraventricular extension without hydrocephalus 3/9: CTA No change in IPH in pons, similar biparietal [facility] 3/14: Now awake, appears locked in. can follow commands with eyes. 3/16 Purposeful with left upper extremity  3/22 tracheostomy placed at bedside, bleeding issues overnight from trach site 3/24 PEG (Dr. Hildy Lowers) 3/24 sputum culture with MRSA 4/20: trach was changed to cuffless #8 5/1: Mental status appears to be somewhat improving off narcotics, more awake alert following simple commands(able to grimace, move left toes, track vertically with right eye).  Speech following. Trach downsized to [employer] cuffless #6 XLT 5/4 -held modafinil , low threshold to resume if mental status/ability to interact diminishes 5/10 PEG dislodged, foley bulb placed and IR consulted to replace tube 5/11 IR replaced PEG tube 5/13 and 5/14 fever.  Back on antibiotic.  Urine culture growing E. coli.  Foley catheter changed. 5/17 cultures are finalized.  Tracheostomy changed.  Currently on ceftriaxone  only.   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 Last CT scan 3/29 with reduction in size of the hemorrhage. Echo EF 60-65%, A1c 4.7, LDL 96 UDS positive for opioids only. Treated with 3% hypertonic saline. 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  Multiple tracheal aspirate has been growing MRSA. Treated with vancomycin  and then later on was switched to Zyvox  due to persistent fever with recurrent aspiration.  E. coli UTI 5/30.  Improving. Urine culture is growing E. coli. Started on ceftriaxone .  Later on was on cefepime  and vancomycin . E. coli is pansensitive. Continue ceftriaxone .  Moderate to severe protein caloric malnutrition, POA Currently on tube feeding and PEG dependent.  PEG tube malfunction.  Resolved. 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 Prior removals/voiding trial unsuccessful Last catheter change was on 5/13.  Macrocytic anemia thrombocytosis Overall stable/resolved Perineal skin injury, POA - Continue Gerhard's Butt cream Goals of care  conversation.  Switch to DNR on  3/13. Relative B12 and folate deficiency. Current replacement with MVI   Subjective: No nausea no vomiting.  No fever no chills.  Tracheostomy had some pink-tinged mucus.  Physical Exam: General: in Mild distress, No Rash Cardiovascular: S1 and S2 Present, No Murmur Respiratory: Good respiratory effort, Bilateral Air entry present.  Upper airway crackles, No wheezes Abdomen: Bowel Sound present, difficult to assess  tenderness Extremities: No edema Neuro: Alert nonverbal.  Data Reviewed: I have Reviewed nursing notes, Vitals, and Lab results. Since last encounter, pertinent lab results CBG   . I have ordered test including CBC and BMP  .   Disposition: Status is: Inpatient Remains inpatient appropriate because: Awaiting placement at SNF.  SCDs Start: 10/27/23 2226   Family Communication: No one at bedside.  Discussed with mother on 5/17. Level of care: Progressive   Vitals:   01/06/24 0729 01/06/24 0817 01/06/24 1120 01/06/24 1525  BP: (!) 154/103 (!) 138/94 (!) 135/96 (!) 126/96  Pulse:  (!) 106 92 97  Resp:  (!) 22 (!) 21 (!) 23  Temp: 98.5 F (36.9 C)  99.4 F (37.4 C) 99.1 F (37.3 C)  TempSrc: Axillary  Axillary Axillary  SpO2:  99% 98% 98%  Weight:      Height:         Author: Charlean Congress, MD 01/06/2024 6:56 PM  Please look on [email or web address] to find out who is on call.

## 2024-01-07 DIAGNOSIS — I613 Nontraumatic intracerebral hemorrhage in brain stem: Secondary | ICD-10-CM | POA: Diagnosis not present

## 2024-01-07 LAB — GLUCOSE, CAPILLARY
Glucose-Capillary: 137 mg/dL — ABNORMAL HIGH (ref 70–99)
Glucose-Capillary: 118 mg/dL — ABNORMAL HIGH (ref 70–99)
Glucose-Capillary: 131 mg/dL — ABNORMAL HIGH (ref 70–99)
Glucose-Capillary: 122 mg/dL — ABNORMAL HIGH (ref 70–99)
Glucose-Capillary: 118 mg/dL — ABNORMAL HIGH (ref 70–99)
Glucose-Capillary: 102 mg/dL — ABNORMAL HIGH (ref 70–99)

## 2024-01-07 NOTE — Plan of Care (Signed)
 Problem: Education: Goal: Knowledge of disease or condition will improve 01/07/2024 0545 by Alejos Husband, RN Outcome: Progressing 01/06/2024 1932 by Alejos Husband, RN Outcome: Progressing Goal: Knowledge of secondary prevention will improve (MUST DOCUMENT ALL) 01/07/2024 0545 by Alejos Husband, RN Outcome: Progressing 01/06/2024 1932 by Alejos Husband, RN Outcome: Progressing Goal: Knowledge of patient specific risk factors will improve (DELETE if not current risk factor) 01/07/2024 0545 by Alejos Husband, RN Outcome: Progressing 01/06/2024 1932 by Alejos Husband, RN Outcome: Progressing   Problem: Intracerebral Hemorrhage Tissue Perfusion: Goal: Complications of Intracerebral Hemorrhage will be minimized 01/07/2024 0545 by Alejos Husband, RN Outcome: Progressing 01/06/2024 1932 by Alejos Husband, RN Outcome: Progressing   Problem: Coping: Goal: Will verbalize positive feelings about self 01/07/2024 0545 by Alejos Husband, RN Outcome: Progressing 01/06/2024 1932 by Alejos Husband, RN Outcome: Progressing Goal: Will identify appropriate support needs 01/07/2024 0545 by Alejos Husband, RN Outcome: Progressing 01/06/2024 1932 by Alejos Husband, RN Outcome: Progressing   Problem: Health Behavior/Discharge Planning: Goal: Ability to manage health-related needs will improve 01/07/2024 0545 by Alejos Husband, RN Outcome: Progressing 01/06/2024 1932 by Alejos Husband, RN Outcome: Progressing Goal: Goals will be collaboratively established with patient/family 01/07/2024 0545 by Alejos Husband, RN Outcome: Progressing 01/06/2024 1932 by Alejos Husband, RN Outcome: Progressing   Problem: Self-Care: Goal: Ability to participate in self-care as condition permits will improve 01/07/2024 0545 by Alejos Husband, RN Outcome: Progressing 01/06/2024 1932 by Alejos Husband, RN Outcome: Progressing Goal: Verbalization of  feelings and concerns over difficulty with self-care will improve 01/07/2024 0545 by Alejos Husband, RN Outcome: Progressing 01/06/2024 1932 by Alejos Husband, RN Outcome: Progressing Goal: Ability to communicate needs accurately will improve 01/07/2024 0545 by Alejos Husband, RN Outcome: Progressing 01/06/2024 1932 by Alejos Husband, RN Outcome: Progressing   Problem: Nutrition: Goal: Risk of aspiration will decrease 01/07/2024 0545 by Alejos Husband, RN Outcome: Progressing 01/06/2024 1932 by Alejos Husband, RN Outcome: Progressing Goal: Dietary intake will improve 01/07/2024 0545 by Alejos Husband, RN Outcome: Progressing 01/06/2024 1932 by Alejos Husband, RN Outcome: Progressing   Problem: Education: Goal: Ability to describe self-care measures that may prevent or decrease complications (Diabetes Survival Skills Education) will improve 01/07/2024 0545 by Alejos Husband, RN Outcome: Progressing 01/06/2024 1932 by Alejos Husband, RN Outcome: Progressing   Problem: Coping: Goal: Ability to adjust to condition or change in health will improve 01/07/2024 0545 by Alejos Husband, RN Outcome: Progressing 01/06/2024 1932 by Alejos Husband, RN Outcome: Progressing   Problem: Fluid Volume: Goal: Ability to maintain a balanced intake and output will improve 01/07/2024 0545 by Alejos Husband, RN Outcome: Progressing 01/06/2024 1932 by Alejos Husband, RN Outcome: Progressing   Problem: Health Behavior/Discharge Planning: Goal: Ability to identify and utilize available resources and services will improve 01/07/2024 0545 by Alejos Husband, RN Outcome: Progressing 01/06/2024 1932 by Alejos Husband, RN Outcome: Progressing Goal: Ability to manage health-related needs will improve 01/07/2024 0545 by Alejos Husband, RN Outcome: Progressing 01/06/2024 1932 by Alejos Husband, RN Outcome: Progressing   Problem:  Metabolic: Goal: Ability to maintain appropriate glucose levels will improve 01/07/2024 0545 by Alejos Husband, RN Outcome: Progressing 01/06/2024 1932 by Alejos Husband, RN Outcome: Progressing   Problem: Nutritional: Goal: Maintenance of adequate nutrition will improve 01/07/2024 0545 by Alejos Husband, RN Outcome: Progressing 01/06/2024 1932 by Darleene Ege  Dyanna Glasgow, RN Outcome: Progressing Goal: Progress toward achieving an optimal weight will improve 01/07/2024 0545 by Alejos Husband, RN Outcome: Progressing 01/06/2024 1932 by Alejos Husband, RN Outcome: Progressing   Problem: Skin Integrity: Goal: Risk for impaired skin integrity will decrease 01/07/2024 0545 by Alejos Husband, RN Outcome: Progressing 01/06/2024 1932 by Alejos Husband, RN Outcome: Progressing   Problem: Tissue Perfusion: Goal: Adequacy of tissue perfusion will improve 01/07/2024 0545 by Alejos Husband, RN Outcome: Progressing 01/06/2024 1932 by Alejos Husband, RN Outcome: Progressing   Problem: Education: Goal: Knowledge of General Education information will improve Description: Including pain rating scale, medication(s)/side effects and non-pharmacologic comfort measures 01/07/2024 0545 by Alejos Husband, RN Outcome: Progressing 01/06/2024 1932 by Alejos Husband, RN Outcome: Progressing   Problem: Health Behavior/Discharge Planning: Goal: Ability to manage health-related needs will improve 01/07/2024 0545 by Alejos Husband, RN Outcome: Progressing 01/06/2024 1932 by Alejos Husband, RN Outcome: Progressing   Problem: Clinical Measurements: Goal: Ability to maintain clinical measurements within normal limits will improve 01/07/2024 0545 by Alejos Husband, RN Outcome: Progressing 01/06/2024 1932 by Alejos Husband, RN Outcome: Progressing Goal: Will remain free from infection 01/07/2024 0545 by Alejos Husband, RN Outcome: Progressing 01/06/2024  1932 by Alejos Husband, RN Outcome: Progressing Goal: Diagnostic test results will improve 01/07/2024 0545 by Alejos Husband, RN Outcome: Progressing 01/06/2024 1932 by Alejos Husband, RN Outcome: Progressing Goal: Respiratory complications will improve 01/07/2024 0545 by Alejos Husband, RN Outcome: Progressing 01/06/2024 1932 by Alejos Husband, RN Outcome: Progressing Goal: Cardiovascular complication will be avoided 01/07/2024 0545 by Alejos Husband, RN Outcome: Progressing 01/06/2024 1932 by Alejos Husband, RN Outcome: Progressing   Problem: Activity: Goal: Risk for activity intolerance will decrease 01/07/2024 0545 by Alejos Husband, RN Outcome: Progressing 01/06/2024 1932 by Alejos Husband, RN Outcome: Progressing   Problem: Nutrition: Goal: Adequate nutrition will be maintained 01/07/2024 0545 by Alejos Husband, RN Outcome: Progressing 01/06/2024 1932 by Alejos Husband, RN Outcome: Progressing   Problem: Coping: Goal: Level of anxiety will decrease 01/07/2024 0545 by Alejos Husband, RN Outcome: Progressing 01/06/2024 1932 by Alejos Husband, RN Outcome: Progressing   Problem: Elimination: Goal: Will not experience complications related to bowel motility 01/07/2024 0545 by Alejos Husband, RN Outcome: Progressing 01/06/2024 1932 by Alejos Husband, RN Outcome: Progressing Goal: Will not experience complications related to urinary retention 01/07/2024 0545 by Alejos Husband, RN Outcome: Progressing 01/06/2024 1932 by Alejos Husband, RN Outcome: Progressing   Problem: Pain Managment: Goal: General experience of comfort will improve and/or be controlled 01/07/2024 0545 by Alejos Husband, RN Outcome: Progressing 01/06/2024 1932 by Alejos Husband, RN Outcome: Progressing   Problem: Safety: Goal: Ability to remain free from injury will improve 01/07/2024 0545 by Alejos Husband, RN Outcome:  Progressing 01/06/2024 1932 by Alejos Husband, RN Outcome: Progressing   Problem: Skin Integrity: Goal: Risk for impaired skin integrity will decrease 01/07/2024 0545 by Alejos Husband, RN Outcome: Progressing 01/06/2024 1932 by Alejos Husband, RN Outcome: Progressing   Problem: Education: Goal: 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 01/07/2024 0545 by Alejos Husband, RN Outcome: Progressing 01/06/2024 1932 by Alejos Husband, RN Outcome: Progressing   Problem: Activity: Goal: Ability to tolerate increased activity will improve 01/07/2024 0545 by Alejos Husband, RN Outcome: Progressing 01/06/2024 1932 by Alejos Husband, RN Outcome: Progressing   Problem: Health Behavior/Discharge Planning:  Goal: Ability to manage tracheostomy will improve 01/07/2024 0545 by Alejos Husband, RN Outcome: Progressing 01/06/2024 1932 by Alejos Husband, RN Outcome: Progressing   Problem: Respiratory: Goal: Patent airway maintenance will improve 01/07/2024 0545 by Alejos Husband, RN Outcome: Progressing 01/06/2024 1932 by Alejos Husband, RN Outcome: Progressing   Problem: Role Relationship: Goal: Ability to communicate will improve 01/07/2024 0545 by Alejos Husband, RN Outcome: Progressing 01/06/2024 1932 by Alejos Husband, RN Outcome: Progressing

## 2024-01-07 NOTE — Progress Notes (Signed)
 Physical Therapy Treatment Patient Details Name: Garrett [name] MRN: [id number] DOB: 1976-01-30 Today's Date: 01/07/2024   History of Present Illness Pt is a 48 yo male who was found down 10/27/23 with agonal breathing. 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 Intubated 3/8, cortrak placed 3/14, trach placed 3/22, PEG placed 3/24. PMH: HTN, smoker    PT Comments  The pt was agreeable to session, demos slow but steady progress with attempts to follow commands and tolerance of upright activity. He required totalA of 2 to complete transition to sitting EOB, but was able to tolerate for ~20 min with VSS and following commands from PT and SLP while sitting EOB. Pt facilitated extension at trunk and upright head positioning, but pt able to follow commands for turning of his head consistently. Will continue to benefit from skilled PT to progress activity tolerance and work on head movements/control for possible future DME.     If plan is discharge home, recommend the following: Two people to help with walking and/or transfers;Two people to help with bathing/dressing/bathroom;Assistance with cooking/housework;Assistance with feeding;Direct supervision/assist for medications management;Direct supervision/assist for financial management;Assist for transportation;Help with stairs or ramp for entrance;Supervision due to cognitive status   Can travel by private vehicle        Equipment Recommendations  [place] lift;Hospital bed;Wheelchair (measurements PT);Wheelchair cushion (measurements PT);BSC/3in1;Other (comment) (air mattress)    Recommendations for Other Services       Precautions / Restrictions Precautions Precautions: Fall;Other (comment) Recall of Precautions/Restrictions: Impaired Precaution/Restrictions Comments: trach, PEG,  abdominal binder, SBP < 160 Required Braces or Orthoses: Other Brace Splint/Cast: Bil resting hand splints Splint/Cast - Date Prophylactic Dressing Applied (if applicable): 11/02/23 Other Brace: has bilateral prevalon boots Restrictions Weight Bearing Restrictions Per Provider Order: No     Mobility  Bed Mobility Overal bed mobility: Needs Assistance Bed Mobility: Rolling, Sidelying to Sit, Sit to Sidelying Rolling: Total assist, +2 for safety/equipment, +2 for physical assistance Sidelying to sit: Total assist, +2 for physical assistance, +2 for safety/equipment, HOB elevated     Sit to sidelying: Total assist, +2 for physical assistance General bed mobility comments: totalA of 2 for safe rolling and elevation of trunk. VSS    Transfers                   General transfer comment: deferred to work on seated balance at EOB and in conjunction with SLP for pt communication    Ambulation/Gait                   Stairs             Wheelchair Mobility     Tilt Bed    Modified Rankin (Stroke Patients Only)       Balance Overall balance assessment: Needs assistance Sitting-balance support: No upper extremity supported, Feet supported Sitting balance-Leahy Scale: Zero Sitting balance - Comments: dependent on trunk and head support Postural control: Posterior lean                                  Communication Communication Communication: Impaired Factors Affecting Communication: Trach/intubated  Cognition Arousal: Alert Behavior During Therapy: Flat affect   PT - Cognitive impairments: Difficult to assess  PT - Cognition Comments: following commands with LUE with 3-5 sec delay and L toes. used yes/no communication board by looking with his eyes consistently Following commands: Impaired Following commands impaired: Follows one step commands with increased time    Cueing Cueing Techniques: Visual  cues  Exercises Other Exercises Other Exercises: head turns, following cues 11 times consistently but with slow, small movements in slight dealy Other Exercises: use of yes/no communication board to position pt and manage TV channel preference Other Exercises: tolerated sitting EOB for ~20 min with SLP working on communication        Pertinent Vitals/Pain Pain Assessment Pain Assessment: Faces Faces Pain Scale: No hurt Breathing: normal Negative Vocalization: none Facial Expression: smiling or inexpressive Body Language: relaxed Consolability: no need to console PAINAD Score: 0 Pain Intervention(s): Monitored during session     PT Goals (current goals can now be found in the care plan section) Acute Rehab PT Goals Patient Stated Goal: unable to state PT Goal Formulation: With patient Time For Goal Achievement: 01/14/24 Potential to Achieve Goals: Fair Progress towards PT goals: Progressing toward goals    Frequency    Min 2X/week      PT Plan      Co-evaluation   Reason for Co-Treatment: Complexity of the patient's impairments (multi-system involvement);Necessary to address cognition/behavior during functional activity          AM-PAC PT "6 Clicks" Mobility   Outcome Measure  Help needed turning from your back to your side while in a flat bed without using bedrails?: Total Help needed moving from lying on your back to sitting on the side of a flat bed without using bedrails?: Total Help needed moving to and from a bed to a chair (including a wheelchair)?: Total Help needed standing up from a chair using your arms (e.g., wheelchair or bedside chair)?: Total Help needed to walk in hospital room?: Total Help needed climbing 3-5 steps with a railing? 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

## 2024-01-07 NOTE — Progress Notes (Signed)
 [facility] Progress Note Patient: Garrett [name] AVW:[id number] DOB: 10-09-1975 DOA: 10/27/2023  DOS: the patient was seen and examined on 01/07/2024  Brief Hospital Course: Patient is a 48 year old male with no known medical history.  Patient found down on 10/27/2023 unresponsive with UDS positive for opiates, minimal responsiveness to Narcan .  Found to be markedly hypertensive at 260/156.  CT head at intake shows 4.1 cm intracranial hemorrhage at the pons with intraventricular extension into the fourth ventricle and basal cisterns.  Initially admitted by critical care, now extubated to tracheostomy which was placed 3/22.  PEG placed 3/24.  Disposition remains difficult given patient's needs with PEG and tracheostomy tube.   Major events: 3/8: Intubated in ED, pontine bleed/SAH. CT head Acute large hemorrhage 4.1 cm in pons with intraventricular extension without hydrocephalus 3/9: CTA No change in IPH in pons, similar biparietal [facility] 3/14: Now awake, appears locked in. can follow commands with eyes. 3/16 Purposeful with left upper extremity  3/22 tracheostomy placed at bedside, bleeding issues overnight from trach site 3/24 PEG (Dr. Hildy Lowers) 3/24 sputum culture with MRSA 4/20: trach was changed to cuffless #8 5/1: Mental status appears to be somewhat improving off narcotics, more awake alert following simple commands(able to grimace, move left toes, track vertically with right eye).  Speech following. Trach downsized to [employer] cuffless #6 XLT 5/4 -held modafinil , low threshold to resume if mental status/ability to interact diminishes 5/10 PEG dislodged, foley bulb placed and IR consulted to replace tube 5/11 IR replaced PEG tube 5/13 and 5/14 fever.  Back on antibiotic.  Urine culture growing E. coli.  Foley catheter changed. 5/17 cultures are finalized.  Tracheostomy changed.  Currently on ceftriaxone  only.   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 Last CT scan 3/29 with reduction in size of the hemorrhage. Echo EF 60-65%, A1c 4.7, LDL 96 UDS positive for opioids only. Treated with 3% hypertonic saline. Narcotics and modafanil held Goal BP below 160 Currently has a tracheostomy and PEG dependent Per SLP on 5/19 patient used PMV for duration of session with stable vital signs - achieved spontaneous voicing but could not transition to purposeful voicing despite max cues    Acute respiratory failure  In the setting of large pontine stroke/brainstem compression Unable to protect airway Trach placed 11/10/23 changed to cuffless 4/21 Pulmonology following 5/1: Shiley cuffless #6 XLT Per pulmonary given difficulty with phonation and inability to protect airway currently not a candidate for decannulation.  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  Multiple tracheal aspirate has been growing MRSA. Treated with vancomycin  and then later on was switched to Zyvox  due to persistent fever with recurrent aspiration.  E. coli UTI 5/30.  Improving. Urine culture is growing E. coli. Started on ceftriaxone .  Later on was on cefepime  and vancomycin . E. coli is pansensitive. Continue ceftriaxone .  Moderate to severe protein caloric malnutrition, POA Currently on tube feeding and PEG dependent.  PEG tube malfunction.  Resolved. 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 Prior removals/voiding trial unsuccessful Last catheter change was  on  5/13.  Macrocytic anemia thrombocytosis Overall stable/resolved Perineal skin injury, POA - Continue Gerhard's Butt cream Goals of care conversation.  Switch to DNR on 3/13. Relative B12 and folate deficiency. Current replacement with MVI   Subjective: No nausea no vomiting no fever no.  No events overnight.  Physical Exam: Upper airway crackles. S1-S2 present. Bowel sound present. No edema.  Data Reviewed: I have Reviewed nursing notes, Vitals, and Lab results. I have ordered test including CBC and BMP  .   Disposition: Status is: Inpatient Remains inpatient appropriate because: Awaiting placement  SCDs Start: 10/27/23 2226   Family Communication: Significant other at bedside Level of care: Progressive   Vitals:   01/07/24 1217 01/07/24 1342 01/07/24 1344 01/07/24 1534  BP: 133/89  (!) 116/92 120/85  Pulse: 78  82 85  Resp: 18  19 11   Temp: 98.6 F (37 C) 98 F (36.7 C) 98.6 F (37 C) 98.6 F (37 C)  TempSrc: Axillary  Axillary Axillary  SpO2: 94%  99% 98%  Weight:      Height:         Author: Charlean Congress, MD 01/07/2024 7:54 PM  Please look on [email or web address] to find out who is on call.

## 2024-01-07 NOTE — Progress Notes (Signed)
 Speech Language Pathology Treatment: [name] Speaking valve;Cognitive-Linquistic  Patient Details Name: Garrett [name] MRN: [id number] DOB: 07-Mar-1976 Today's Date: 01/07/2024 Time: 4098-1191 SLP Time Calculation (min) (ACUTE ONLY): 43 min  Assessment / Plan / Recommendation Clinical Impression  Co-treated with PT in order to optimize pt's participation and respiratory support for potential communication.  Pt sat EOB with PT facilitation and support from behind while SLP worked on Veterinary surgeon. Pt was very alert and responding with intention.   He answered yes/no questions about preferences and comfort on 100% of opportunities by using vertical yes/no signs and sustaining gaze to target word.  He responded to requests to turn head, move left foot toes, move left thumb on >75% of opportunities. He used PMV for duration of session with stable vital signs - achieved spontaneous voicing but could not transition to purposeful voicing despite max cues (has had some success with this on prior occasions).  He was much more interactive than he has been during prior sessions - co-treating was beneficial in eliciting more participation to address SLP goals.    HPI HPI: Pt is a 48 yo male who was found down 10/27/23 with agonal breathing. 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 Intubated 3/8, cortrak placed 3/14, trach placed 3/22, PEG placed 3/24. PMH: HTN, smoker      SLP Plan  Continue with current plan of care      Recommendations for follow up therapy are one component of a multi-disciplinary discharge planning process, led by the attending physician.  Recommendations may be updated based on patient status, additional functional criteria and insurance authorization.    Recommendations  Diet recommendations:  NPO Medication Administration: Via alternative means      Patient may use Passy-Muir Speech Valve: During all therapies with supervision;Intermittently with supervision PMSV Supervision: Full           Oral care QID   Frequent or constant Supervision/Assistance Aphonia (R49.1);Cognitive communication deficit (R41.841)     Continue with current plan of care    Garrett [name] L. Beatris Lincoln, MA CCC/SLP Clinical Specialist - Acute Care SLP Acute Rehabilitation Services Office number [phone]  Garrett [name]  01/07/2024, 3:07 PM

## 2024-01-08 DIAGNOSIS — I613 Nontraumatic intracerebral hemorrhage in brain stem: Secondary | ICD-10-CM | POA: Diagnosis not present

## 2024-01-08 LAB — CBC WITH DIFFERENTIAL/PLATELET
Abs Immature Granulocytes: 0.07 10*3/uL (ref 0.00–0.07)
Basophils Absolute: 0.1 10*3/uL (ref 0.0–0.1)
Basophils Relative: 1 %
Eosinophils Absolute: 0.3 10*3/uL (ref 0.0–0.5)
Eosinophils Relative: 3 %
HCT: 34.9 % — ABNORMAL LOW (ref 39.0–52.0)
Hemoglobin: 11 g/dL — ABNORMAL LOW (ref 13.0–17.0)
Immature Granulocytes: 1 %
Lymphocytes Relative: 27 %
Lymphs Abs: 2.8 10*3/uL (ref 0.7–4.0)
MCH: 29.3 pg (ref 26.0–34.0)
MCHC: 31.5 g/dL (ref 30.0–36.0)
MCV: 92.8 fL (ref 80.0–100.0)
Monocytes Absolute: 0.9 10*3/uL (ref 0.1–1.0)
Monocytes Relative: 8 %
Neutro Abs: 6.2 10*3/uL (ref 1.7–7.7)
Neutrophils Relative %: 60 %
Platelets: 560 10*3/uL — ABNORMAL HIGH (ref 150–400)
RBC: 3.76 MIL/uL — ABNORMAL LOW (ref 4.22–5.81)
RDW: 14.1 % (ref 11.5–15.5)
Smear Review: NORMAL
WBC: 10.2 10*3/uL (ref 4.0–10.5)
nRBC: 0 % (ref 0.0–0.2)

## 2024-01-08 LAB — COMPREHENSIVE METABOLIC PANEL WITH GFR
ALT: 176 U/L — ABNORMAL HIGH (ref 0–44)
AST: 58 U/L — ABNORMAL HIGH (ref 15–41)
Albumin: 2.8 g/dL — ABNORMAL LOW (ref 3.5–5.0)
Alkaline Phosphatase: 60 U/L (ref 38–126)
Anion gap: 9 (ref 5–15)
BUN: 22 mg/dL — ABNORMAL HIGH (ref 6–20)
CO2: 26 mmol/L (ref 22–32)
Calcium: 9 mg/dL (ref 8.9–10.3)
Chloride: 101 mmol/L (ref 98–111)
Creatinine, Ser: 0.59 mg/dL — ABNORMAL LOW (ref 0.61–1.24)
GFR, Estimated: >60 mL/min (ref >60)
Glucose, Bld: 136 mg/dL — ABNORMAL HIGH (ref 70–99)
Potassium: 4 mmol/L (ref 3.5–5.1)
Sodium: 136 mmol/L (ref 135–145)
Total Bilirubin: 0.4 mg/dL (ref 0.0–1.2)
Total Protein: 7.2 g/dL (ref 6.5–8.1)

## 2024-01-08 LAB — GLUCOSE, CAPILLARY
Glucose-Capillary: 125 mg/dL — ABNORMAL HIGH (ref 70–99)
Glucose-Capillary: 123 mg/dL — ABNORMAL HIGH (ref 70–99)
Glucose-Capillary: 113 mg/dL — ABNORMAL HIGH (ref 70–99)
Glucose-Capillary: 124 mg/dL — ABNORMAL HIGH (ref 70–99)
Glucose-Capillary: 97 mg/dL (ref 70–99)
Glucose-Capillary: 106 mg/dL — ABNORMAL HIGH (ref 70–99)

## 2024-01-08 LAB — MAGNESIUM: Magnesium: 2.1 mg/dL (ref 1.7–2.4)

## 2024-01-08 NOTE — Progress Notes (Signed)
 [facility] Progress Note Patient: Kotaro Buer ZOX:[id number] DOB: 08-20-1976 DOA: 10/27/2023  DOS: the patient was seen and examined on 01/08/2024  Brief Hospital Course: Patient is a 48 year old male with no known medical history.  Patient found down on 10/27/2023 unresponsive with UDS positive for opiates, minimal responsiveness to Narcan .  Found to be markedly hypertensive at 260/156.  CT head at intake shows 4.1 cm intracranial hemorrhage at the pons with intraventricular extension into the fourth ventricle and basal cisterns.  Initially admitted by critical care, now extubated to tracheostomy which was placed 3/22.  PEG placed 3/24.  Disposition remains difficult given patient's needs with PEG and tracheostomy tube.  Currently awaiting disability.  Major events: 3/8: Intubated in ED, pontine bleed/SAH. CT head Acute large hemorrhage 4.1 cm in pons with intraventricular extension without hydrocephalus 3/9: CTA No change in IPH in pons, similar biparietal [facility] 3/14: Now awake, appears locked in. can follow commands with eyes. 3/16 Purposeful with left upper extremity  3/22 tracheostomy placed at bedside, bleeding issues overnight from trach site 3/24 PEG (Dr. Hildy Lowers) 3/24 sputum culture with MRSA 4/20: trach was changed to cuffless #8 5/1: Mental status appears to be somewhat improving off narcotics, more awake alert following simple commands(able to grimace, move left toes, track vertically with right eye).  Speech following. Trach downsized to [employer] cuffless #6 XLT 5/4 -held modafinil , low threshold to resume if mental status/ability to interact diminishes 5/10 PEG dislodged, foley bulb placed and IR consulted to replace tube 5/11 IR replaced PEG tube 5/13 and 5/14 fever.  Back on antibiotic.  Urine culture growing E. coli.  Foley catheter changed. 5/17 cultures are finalized.  Tracheostomy changed.  Currently on ceftriaxone  only.   Assessment & Plan: Incomplete locked-in  syndrome Acute 4.1 cm pontine CVA + brain stem compression with resultant quadriparesis Secondary to hypertensive emergency Multiple CT scan. Last CT scan 3/29 with reduction in size of the hemorrhage. Echo EF 60-65%, A1c 4.7, LDL 96 UDS positive for opioids only. Treated with 3% hypertonic saline. Narcotics and modafanil held Goal BP below 160 Currently has a tracheostomy and PEG dependent Per SLP on 5/19 patient used PMV for duration of session with stable vital signs - achieved spontaneous voicing but could not transition to purposeful voicing despite max cues    Acute respiratory failure  In the setting of large pontine stroke/brainstem compression Unable to protect airway Trach placed 11/10/23 changed to cuffless 4/21 Pulmonology following 5/1: Shiley cuffless #6 XLT Per pulmonary given difficulty with phonation and inability to protect airway currently not a candidate for decannulation.  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  Multiple tracheal aspirate has been growing MRSA. Treated with vancomycin  and then later on was switched to Zyvox  due to persistent fever with recurrent aspiration.  E. coli UTI 5/30.  Improving. Urine culture is growing E. coli.  Pansensitive. Foley catheter was changed. Antibiotic course completed on 5/18.  Moderate to severe protein caloric malnutrition, POA Currently on tube feeding and PEG dependent.  PEG tube malfunction.  Resolved. 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 Prior removals/voiding trial unsuccessful Last catheter change was on  5/13.  Macrocytic anemia thrombocytosis Overall stable/resolved Perineal skin injury, POA - Continue Gerhard's Butt cream Goals of care conversation.  Switch to DNR on 3/13. Relative B12 and folate deficiency. Current replacement with MVI   Subjective: No acute change in the mental status.  No acute overnight.  Physical Exam: Clear to auscultation.  Upper airway crackles have resolved. S1-S2 present. Bowel sound present No edema. Tracking with right eye and able to squeeze with left lower extremity still.  Data Reviewed: I have Reviewed nursing notes, Vitals, and Lab results. Since last encounter, pertinent lab results CBC and BMP   .   Disposition: Status is: Inpatient Remains inpatient appropriate because: Awaiting placement  SCDs Start: 10/27/23 2226   Family Communication: No one at bedside significant other was at bedside on 5/19.  Discussed with mother over the weekend. Level of care: Telemetry Medical switching from progressive to telemetry. Vitals:   01/08/24 0305 01/08/24 0500 01/08/24 0743 01/08/24 1206  BP:   133/82 (!) 126/93  Pulse: 79  82 81  Resp: 18  16 18   Temp:   98.4 F (36.9 C) 98.1 F (36.7 C)  TempSrc:   Axillary Oral  SpO2: 99%  98% 97%  Weight:  93.4 kg    Height:         Author: Charlean Congress, MD 01/08/2024 6:44 PM  Please look on [email or web address] to find out who is on call.

## 2024-01-09 DIAGNOSIS — I613 Nontraumatic intracerebral hemorrhage in brain stem: Secondary | ICD-10-CM | POA: Diagnosis not present

## 2024-01-09 LAB — GLUCOSE, CAPILLARY
Glucose-Capillary: 152 mg/dL — ABNORMAL HIGH (ref 70–99)
Glucose-Capillary: 128 mg/dL — ABNORMAL HIGH (ref 70–99)
Glucose-Capillary: 116 mg/dL — ABNORMAL HIGH (ref 70–99)
Glucose-Capillary: 151 mg/dL — ABNORMAL HIGH (ref 70–99)
Glucose-Capillary: 142 mg/dL — ABNORMAL HIGH (ref 70–99)
Glucose-Capillary: 97 mg/dL (ref 70–99)

## 2024-01-09 NOTE — Progress Notes (Signed)
 Occupational Therapy Treatment Patient Details Name: Garrett [name] MRN: [id number] DOB: [birth year] Today's Date: 01/09/2024   History of present illness Pt is a 48 yo male who was found down 10/27/23 with agonal breathing. 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 Intubated 3/8, cortrak placed 3/14, trach placed 3/22, PEG placed 3/24. PMH: HTN, smoker   OT comments  Pt oob to chair this session listening to music. Pt worked on cervical activation, gaze stabilization for yes / no sign and grasp/ release with L hand. Pt moving L hand on command. Recommendation for skilled inpatient follow up therapy, <3 hours/day.  Call me D      If plan is discharge home, recommend the following:  Two people to help with walking and/or transfers;Two people to help with bathing/dressing/bathroom;Direct supervision/assist for medications management;Direct supervision/assist for financial management;Assist for transportation;Help with stairs or ramp for entrance   Equipment Recommendations  Wheelchair (measurements OT);Wheelchair cushion (measurements OT);Hospital bed;Hoyer lift    Recommendations for Other Services Speech consult    Precautions / Restrictions Precautions Precautions: Fall;Other (comment) Recall of Precautions/Restrictions: Impaired Precaution/Restrictions Comments: trach, PEG, abdominal binder, SBP < 160 Required Braces or Orthoses: Other Brace Splint/Cast: Bil resting hand splints Splint/Cast - Date Prophylactic Dressing Applied (if applicable): 11/02/23 Other Brace: has bilateral prevalon boots Restrictions Weight Bearing Restrictions Per Provider Order: No       Mobility Bed Mobility Overal bed mobility: Needs Assistance Bed Mobility: Rolling Rolling: +2 for physical assistance, +2 for safety/equipment, Total  assist         General bed mobility comments: hoyer pad placed    Transfers Overall transfer level: Needs assistance Equipment used: Ambulation equipment used               General transfer comment: total (A) to chair Transfer via Lift Equipment: Maximove   Balance Overall balance assessment: Needs assistance   Sitting balance-Leahy Scale: Zero                                     ADL either performed or assessed with clinical judgement   ADL Overall ADL's : Needs assistance/impaired                                       General ADL Comments: lifted oob to chair after pt was able to visually scan "YeS' with chart. pt oob in chair with cervical rotation R and L with command. pt needs (A) to turn toward L side. Pt with delayed response to initiate. Pt worked on L UE activation squeeze and release    Extremity/Trunk Assessment Upper Extremity Assessment LUE Deficits / Details: actively squeeze orange ball            Vision       Perception     Praxis     Communication Communication Factors Affecting Communication: Trach/intubated   Cognition Arousal: Alert Behavior During Therapy: Flat affect Cognition: Difficult to assess Difficult to assess due to: Impaired communication           OT - Cognition Comments: attempts to communicate with eye movement                 Following  commands: Impaired Following commands impaired: Follows one step commands with increased time      Cueing   Cueing Techniques: Visual cues  Exercises Other Exercises Other Exercises: AROM R and L with AAROM for L side cervical rotation Other Exercises: able to answer yes no response with accuracy using chart    Shoulder Instructions       General Comments 28% trach collar    Pertinent Vitals/ Pain       Pain Assessment Pain Assessment: No/denies pain Pain Intervention(s): Limited activity within patient's tolerance, Monitored  during session  Home Living                                          Prior Functioning/Environment              Frequency  Min 1X/week        Progress Toward Goals  OT Goals(current goals can now be found in the care plan section)  Progress towards OT goals: Progressing toward goals  Acute Rehab OT Goals Patient Stated Goal: none stated OT Goal Formulation: With family Time For Goal Achievement: 01/24/24 Potential to Achieve Goals: Fair ADL Goals Pt/caregiver will Perform Home Exercise Program: Increased ROM;Both right and left upper extremity Additional ADL Goal #1: Pt will follow 1 step commands with 50% accuracy in a nondistracting enviornment. Additional ADL Goal #2: Pt will complete bed mobility with max assist +2, maintain sitting balance statically for 3 minutes with mod assist. Additional ADL Goal #3: Pt will demonstrates grasp and release with L UE x3 out 3 attempts  Plan      Co-evaluation                 AM-PAC OT "6 Clicks" Daily Activity     Outcome Measure   Help from another person eating meals?: Total Help from another person taking care of personal grooming?: Total Help from another person toileting, which includes using toliet, bedpan, or urinal?: Total Help from another person bathing (including washing, rinsing, drying)?: Total Help from another person to put on and taking off regular upper body clothing?: Total Help from another person to put on and taking off regular lower body clothing?: Total 6 Click Score: 6    End of Session Equipment Utilized During Treatment: Oxygen  OT Visit Diagnosis: Other abnormalities of gait and mobility (R26.89);Muscle weakness (generalized) (M62.81);Other symptoms and signs involving the nervous system (R29.898);Other symptoms and signs involving cognitive function;Hemiplegia and hemiparesis 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   Neomia Banner 01/09/2024, 11:35 AM

## 2024-01-09 NOTE — Plan of Care (Signed)
 Problem: Education: Goal: Knowledge of disease or condition will improve 01/09/2024 0639 by Alejos Husband, RN Outcome: Progressing 01/08/2024 1943 by Alejos Husband, RN Outcome: Progressing Goal: Knowledge of secondary prevention will improve (MUST DOCUMENT ALL) 01/09/2024 0639 by Alejos Husband, RN Outcome: Progressing 01/08/2024 1943 by Alejos Husband, RN Outcome: Progressing Goal: Knowledge of patient specific risk factors will improve (DELETE if not current risk factor) 01/09/2024 0639 by Alejos Husband, RN Outcome: Progressing 01/08/2024 1943 by Alejos Husband, RN Outcome: Progressing   Problem: Intracerebral Hemorrhage Tissue Perfusion: Goal: Complications of Intracerebral Hemorrhage will be minimized 01/09/2024 0639 by Alejos Husband, RN Outcome: Progressing 01/08/2024 1943 by Alejos Husband, RN Outcome: Progressing   Problem: Coping: Goal: Will verbalize positive feelings about self 01/09/2024 0639 by Alejos Husband, RN Outcome: Progressing 01/08/2024 1943 by Alejos Husband, RN Outcome: Progressing Goal: Will identify appropriate support needs 01/09/2024 0639 by Alejos Husband, RN Outcome: Progressing 01/08/2024 1943 by Alejos Husband, RN Outcome: Progressing   Problem: Health Behavior/Discharge Planning: Goal: Ability to manage health-related needs will improve 01/09/2024 0639 by Alejos Husband, RN Outcome: Progressing 01/08/2024 1943 by Alejos Husband, RN Outcome: Progressing Goal: Goals will be collaboratively established with patient/family 01/09/2024 [phone] by Alejos Husband, RN Outcome: Progressing 01/08/2024 1943 by Alejos Husband, RN Outcome: Progressing   Problem: Self-Care: Goal: Ability to participate in self-care as condition permits will improve 01/09/2024 0639 by Alejos Husband, RN Outcome: Progressing 01/08/2024 1943 by Alejos Husband, RN Outcome: Progressing Goal: Verbalization of  feelings and concerns over difficulty with self-care will improve 01/09/2024 0639 by Alejos Husband, RN Outcome: Progressing 01/08/2024 1943 by Alejos Husband, RN Outcome: Progressing Goal: Ability to communicate needs accurately will improve 01/09/2024 0639 by Alejos Husband, RN Outcome: Progressing 01/08/2024 1943 by Alejos Husband, RN Outcome: Progressing   Problem: Nutrition: Goal: Risk of aspiration will decrease 01/09/2024 0639 by Alejos Husband, RN Outcome: Progressing 01/08/2024 1943 by Alejos Husband, RN Outcome: Progressing Goal: Dietary intake will improve 01/09/2024 0639 by Alejos Husband, RN Outcome: Progressing 01/08/2024 1943 by Alejos Husband, RN Outcome: Progressing   Problem: Education: Goal: Ability to describe self-care measures that may prevent or decrease complications (Diabetes Survival Skills Education) will improve 01/09/2024 0639 by Alejos Husband, RN Outcome: Progressing 01/08/2024 1943 by Alejos Husband, RN Outcome: Progressing   Problem: Coping: Goal: Ability to adjust to condition or change in health will improve 01/09/2024 0639 by Alejos Husband, RN Outcome: Progressing 01/08/2024 1943 by Alejos Husband, RN Outcome: Progressing   Problem: Fluid Volume: Goal: Ability to maintain a balanced intake and output will improve 01/09/2024 0639 by Alejos Husband, RN Outcome: Progressing 01/08/2024 1943 by Alejos Husband, RN Outcome: Progressing   Problem: Health Behavior/Discharge Planning: Goal: Ability to identify and utilize available resources and services will improve 01/09/2024 0639 by Alejos Husband, RN Outcome: Progressing 01/08/2024 1943 by Alejos Husband, RN Outcome: Progressing Goal: Ability to manage health-related needs will improve 01/09/2024 0639 by Alejos Husband, RN Outcome: Progressing 01/08/2024 1943 by Alejos Husband, RN Outcome: Progressing   Problem:  Metabolic: Goal: Ability to maintain appropriate glucose levels will improve 01/09/2024 0639 by Alejos Husband, RN Outcome: Progressing 01/08/2024 1943 by Alejos Husband, RN Outcome: Progressing   Problem: Nutritional: Goal: Maintenance of adequate nutrition will improve 01/09/2024 0639 by Alejos Husband, RN Outcome: Progressing 01/08/2024 1943 by Darleene Ege  Dyanna Glasgow, RN Outcome: Progressing Goal: Progress toward achieving an optimal weight will improve 01/09/2024 0639 by Alejos Husband, RN Outcome: Progressing 01/08/2024 1943 by Alejos Husband, RN Outcome: Progressing   Problem: Skin Integrity: Goal: Risk for impaired skin integrity will decrease 01/09/2024 0639 by Alejos Husband, RN Outcome: Progressing 01/08/2024 1943 by Alejos Husband, RN Outcome: Progressing   Problem: Tissue Perfusion: Goal: Adequacy of tissue perfusion will improve 01/09/2024 0639 by Alejos Husband, RN Outcome: Progressing 01/08/2024 1943 by Alejos Husband, RN Outcome: Progressing   Problem: Education: Goal: Knowledge of General Education information will improve Description: Including pain rating scale, medication(s)/side effects and non-pharmacologic comfort measures 01/09/2024 0639 by Alejos Husband, RN Outcome: Progressing 01/08/2024 1943 by Alejos Husband, RN Outcome: Progressing   Problem: Health Behavior/Discharge Planning: Goal: Ability to manage health-related needs will improve 01/09/2024 0639 by Alejos Husband, RN Outcome: Progressing 01/08/2024 1943 by Alejos Husband, RN Outcome: Progressing   Problem: Clinical Measurements: Goal: Ability to maintain clinical measurements within normal limits will improve 01/09/2024 0639 by Alejos Husband, RN Outcome: Progressing 01/08/2024 1943 by Alejos Husband, RN Outcome: Progressing Goal: Will remain free from infection 01/09/2024 0639 by Alejos Husband, RN Outcome: Progressing 01/08/2024  1943 by Alejos Husband, RN Outcome: Progressing Goal: Diagnostic test results will improve 01/09/2024 0639 by Alejos Husband, RN Outcome: Progressing 01/08/2024 1943 by Alejos Husband, RN Outcome: Progressing Goal: Respiratory complications will improve 01/09/2024 0639 by Alejos Husband, RN Outcome: Progressing 01/08/2024 1943 by Alejos Husband, RN Outcome: Progressing Goal: Cardiovascular complication will be avoided 01/09/2024 1478 by Alejos Husband, RN Outcome: Progressing 01/08/2024 1943 by Alejos Husband, RN Outcome: Progressing   Problem: Activity: Goal: Risk for activity intolerance will decrease 01/09/2024 0639 by Alejos Husband, RN Outcome: Progressing 01/08/2024 1943 by Alejos Husband, RN Outcome: Progressing   Problem: Nutrition: Goal: Adequate nutrition will be maintained 01/09/2024 0639 by Alejos Husband, RN Outcome: Progressing 01/08/2024 1943 by Alejos Husband, RN Outcome: Progressing   Problem: Coping: Goal: Level of anxiety will decrease 01/09/2024 0639 by Alejos Husband, RN Outcome: Progressing 01/08/2024 1943 by Alejos Husband, RN Outcome: Progressing   Problem: Elimination: Goal: Will not experience complications related to bowel motility 01/09/2024 0639 by Alejos Husband, RN Outcome: Progressing 01/08/2024 1943 by Alejos Husband, RN Outcome: Progressing Goal: Will not experience complications related to urinary retention 01/09/2024 0639 by Alejos Husband, RN Outcome: Progressing 01/08/2024 1943 by Alejos Husband, RN Outcome: Progressing   Problem: Pain Managment: Goal: General experience of comfort will improve and/or be controlled 01/09/2024 0639 by Alejos Husband, RN Outcome: Progressing 01/08/2024 1943 by Alejos Husband, RN Outcome: Progressing   Problem: Safety: Goal: Ability to remain free from injury will improve 01/09/2024 0639 by Alejos Husband, RN Outcome:  Progressing 01/08/2024 1943 by Alejos Husband, RN Outcome: Progressing   Problem: Skin Integrity: Goal: Risk for impaired skin integrity will decrease 01/09/2024 0639 by Alejos Husband, RN Outcome: Progressing 01/08/2024 1943 by Alejos Husband, RN Outcome: Progressing   Problem: Education: Goal: 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 01/09/2024 0639 by Alejos Husband, RN Outcome: Progressing 01/08/2024 1943 by Alejos Husband, RN Outcome: Progressing   Problem: Activity: Goal: Ability to tolerate increased activity will improve 01/09/2024 0639 by Alejos Husband, RN Outcome: Progressing 01/08/2024 1943 by Alejos Husband, RN Outcome: Progressing   Problem: Health Behavior/Discharge Planning:  Goal: Ability to manage tracheostomy will improve 01/09/2024 0639 by Alejos Husband, RN Outcome: Progressing 01/08/2024 1943 by Alejos Husband, RN Outcome: Progressing   Problem: Respiratory: Goal: Patent airway maintenance will improve 01/09/2024 0639 by Alejos Husband, RN Outcome: Progressing 01/08/2024 1943 by Alejos Husband, RN Outcome: Progressing   Problem: Role Relationship: Goal: Ability to communicate will improve 01/09/2024 0639 by Alejos Husband, RN Outcome: Progressing 01/08/2024 1943 by Alejos Husband, RN Outcome: Progressing

## 2024-01-09 NOTE — Progress Notes (Addendum)
 PROGRESS NOTE    Garrett [name]  WJX:[id number] DOB: 05/08/1976 DOA: 10/27/2023 PCP: Pcp, No    Brief Narrative:   Garrett [name] is a 48 y.o. male with no known previous medical history who was found down on 10/27/2023, unresponsive with UDS positive for opiates.  He was minimally responsive to Narcan  and he was found to be markedly hypertensive with BP 260/156.  CT head at intake notable for a 4.1 cm intracranial hemorrhage at the pons with intraventricular extension into the fourth ventricle and basal cisterns.  Initially admitted by critical care, now extubated with tracheostomy initially placed on 11/10/2023.  PEG tube placed 11/12/2023.  Disposition remains difficult given patient's needs with PEG and tracheostomy tube.  Currently awaiting disability.   Significant hospital events: 3/8: Intubated in ED, pontine bleed/SAH. CT head Acute large hemorrhage 4.1 cm in pons with intraventricular extension without hydrocephalus 3/9: CTA No change in IPH in pons, similar biparietal [facility] 3/14: Now awake, appears locked in. can follow commands with eyes. 3/16 Purposeful with left upper extremity  3/22 tracheostomy placed at bedside, bleeding issues overnight from trach site 3/24 PEG (Dr. Hildy Lowers) 3/24 sputum culture with MRSA 4/20: trach was changed to cuffless #8 5/1: Mental status appears to be somewhat improving off narcotics, more awake alert following simple commands(able to grimace, move left toes, track vertically with right eye).  Speech following. Trach downsized to [employer] cuffless #6 XLT 5/4: Modafinil  held, low threshold to resume if mental status/ability to interact diminishes 5/10: PEG dislodged, foley bulb placed and IR consulted to replace tube 5/11: IR replaced PEG tube 5/13 - 5/14: + Fever.  Restarted antibiotics.  Urine culture + E. Coli, foley catheter changed. 5/17 Tracheostomy changed.   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  UDS positive for opiates.  CT head with acute large hemorrhage, 4.1 cm in pons with) ventricular extension without hydronephrosis efflux.  Etiology likely secondary to hypertensive emergency with elevated blood pressure 260/156 at time of admission.  Garrett [name] admitted to the intensive care unit on the [facility] service as well as neurology following.  TTE with LVEF 60 to 65%, hemoglobin A1c 4.7, LDL 96.  Treated with 3% hypertonic saline.  Follow-up with CT head 3/29 with reduction in size of hemorrhage.  Patient underwent tracheostomy placement on 11/10/2023 and PEG tube placement on 11/12/2023. -- Per SLP on 5/19 patient used PMV for duration of session with stable vital signs - achieved spontaneous voicing but could not transition to purposeful voicing despite max cues    Acute respiratory failure 2/2 large pontine stroke/brainstem compression Unable to protect airway Trach initially placed 11/10/23 changed to cuffless 4/21, and exchanged for Shiley cuffless #6 XLT on 12/20/2023. 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 Multiple tracheal aspirate has been growing MRSA. Treated with vancomycin  and then later on was switched to Zyvox  due to persistent fever with recurrent aspiration. -- SLP following -- Aspiration precautions   E. coli UTI: resolved Urine culture + pansensitive E. coli.  Foley catheter was changed 5/13. Antibiotic course completed on 5/18.   Moderate to severe  protein caloric malnutrition, POA Body mass index is 31.31 kg/m. Nutrition Status: Nutrition Problem: Inadequate oral intake Etiology: inability to eat Signs/Symptoms: NPO status Interventions: Tube feeding, Prostat, MVI, Juven -- S/p PEG tube placement, continues on tube feeds    PEG tube malfunction.  Resolved. PEG dislodged 5/10, replaced on 5/11 by IR   Exposure keratopathy, OS Seen by ophthalmology, Dr. Mason Sole on 4/7.  Treated with maxitrol ointment (neo-poly-dex) TID OS for 1 week. -- Refresh PM TID to QID OS for chronic lubrication -- Can tape eyelid closed if still exposure an issue (plastic tape may work better than paper and would try to tape eyelid directly rather than with gauze to keep enough tension to ensure it isn't remaining open)   AKI with acute urinary retention Currently has a Foley catheter. Prior removals/voiding trial unsuccessful -- Last catheter change was on 5/13.   Macrocytic anemia thrombocytosis Overall stable/resolved  Perineal skin injury, POA - Continue Gerhard's Butt cream  Relative B12 and folate deficiency. Current replacement with MVI   Goals of care conversation.  Switch to DNR on 3/13.    DVT prophylaxis: SCDs Start: 10/27/23 2226    Code Status: Do not attempt resuscitation (DNR) PRE-ARREST INTERVENTIONS DESIRED Family Communication: No family present at bedside  Disposition Plan:  Level of care: Telemetry Medical Status is: Inpatient Remains inpatient appropriate because: Difficult disposition    Consultants:  PCCM Neurology Palliative Care General Surgery for PEG tube placement Ophthalmology, Dr. Mason Sole Interventional radiology for G-tube exchange 5/11  Procedures:  EEG 3/10 Percutaneous tracheostomy, Dr. Zaida Hertz 3/22 Noster bronchoscopy 3/22; Dr. Diania Fortes PEG tube placement, Dr. Hildy Lowers 3/24 PEG tube exchange, IR 5/11  Antimicrobials:  Ceftriaxone  5/13 - 5/18 Vancomycin  3/24 - 4/8, 5/14 - 5/16 Cefepime  5/14 -  5/15 Linezolid  4/9 - 4/15 Unasyn  3/23 - 3/24   Subjective: Patient seen examined bedside, lying in bed.  Alert but poorly responsive/interactive.  No family present at bedside.  No acute issues overnight per nursing staff.  Objective: Vitals:   01/08/24 2324 01/09/24 0408 01/09/24 0453 01/09/24 0750  BP: (!) 125/101 116/86  (!) 142/96  Pulse: 84 81  87  Resp: 20 16  18   Temp: 97.6 F (36.4 C) 98.8 F (37.1 C)  98.1 F (36.7 C)  TempSrc: Oral Oral  Axillary  SpO2: 96% 96%  96%  Weight:   93.4 kg   Height:        Intake/Output Summary (Last 24 hours) at 01/09/2024 1103 Last data filed at 01/09/2024 0754 Gross per 24 hour  Intake --  Output 2250 ml  Net -2250 ml   Filed Weights   01/07/24 0325 01/08/24 0500 01/09/24 0453  Weight: 93.4 kg 93.4 kg 93.4 kg    Examination:  Physical Exam: GEN: NAD, awake HEENT: NCAT, sclera clear, left eye with patch in place, trach noted PULM: Diminished breath sounds bilateral bases, no wheezing/crackles, normal respiratory effort without accessory muscle use, on 6 L trach collar CV: RRR w/o M/G/R GI: abd soft, NTND, + BS; PEG tube noted GU: Foley catheter noted with clear yellow urine in collection bag MSK: Right hand/wrist with splint in place    Data Reviewed: I have personally reviewed following labs and imaging studies  CBC: Recent Labs  Lab 01/02/24 2006 01/03/24 0617 01/04/24 0604 01/05/24 0515 01/08/24 0711  WBC 20.5* 12.7* 9.1 6.6 10.2  NEUTROABS 16.3*  --   --   --  6.2  HGB 11.2* 11.3* 10.8* 11.4* 11.0*  HCT 34.7* 34.9* 33.3* 35.3* 34.9*  MCV 92.3 92.6 91.7 92.4 92.8  PLT 452* 426* 410* 415* 560*   Basic Metabolic Panel: Recent Labs  Lab 01/02/24 2006 01/03/24 0617 01/04/24 0604 01/05/24 0515 01/08/24 0711  NA 135 134* 134* 135 136  K 3.4* 3.7 3.2* 3.8 4.0  CL 100 101 102 100 101  CO2 25 22 23 23 26   GLUCOSE 123* 157* 135* 141* 136*  BUN 24* 19 14 19  22*  CREATININE 0.62 0.77 0.61 0.53* 0.59*   CALCIUM 8.8* 8.7* 8.5* 8.9 9.0  MG  --  1.9 1.9 2.0 2.1   GFR: Estimated Creatinine Clearance: 126.6 mL/min (A) (by C-G formula based on SCr of 0.59 mg/dL (L)). Liver Function Tests: Recent Labs  Lab 01/02/24 2006 01/08/24 0711  AST 47* 58*  ALT 186* 176*  ALKPHOS 80 60  BILITOT 0.3 0.4  PROT 7.3 7.2  ALBUMIN  2.8* 2.8*   No results for input(s): "LIPASE", "AMYLASE" in the last 168 hours. No results for input(s): "AMMONIA" in the last 168 hours. Coagulation Profile: No results for input(s): "INR", "PROTIME" in the last 168 hours. Cardiac Enzymes: No results for input(s): "CKTOTAL", "CKMB", "CKMBINDEX", "TROPONINI" in the last 168 hours. BNP (last 3 results) No results for input(s): "PROBNP" in the last 8760 hours. HbA1C: No results for input(s): "HGBA1C" in the last 72 hours. CBG: Recent Labs  Lab 01/08/24 1609 01/08/24 1928 01/08/24 2322 01/09/24 0435 01/09/24 0748  GLUCAP 124* 97 106* 152* 128*   Lipid Profile: No results for input(s): "CHOL", "HDL", "LDLCALC", "TRIG", "CHOLHDL", "LDLDIRECT" in the last 72 hours. Thyroid Function Tests: No results for input(s): "TSH", "T4TOTAL", "FREET4", "T3FREE", "THYROIDAB" in the last 72 hours. Anemia Panel: No results for input(s): "VITAMINB12", "FOLATE", "FERRITIN", "TIBC", "IRON", "RETICCTPCT" in the last 72 hours. Sepsis Labs: Recent Labs  Lab 01/02/24 2006  PROCALCITON 0.23    Recent Results (from the past 240 hours)  Remove and replace urinary cath (placed > 5 days) then obtain urine culture from new indwelling urinary catheter.     Status: Abnormal   Collection Time: 01/01/24 10:58 AM   Specimen: Urine, Catheterized  Result Value Ref Range Status   Specimen Description URINE, CATHETERIZED  Final   Special Requests   Final    NONE Performed at [facility], [address] [place], [name] [address]    Culture >=100,000 COLONIES/mL ESCHERICHIA COLI (A)  Final   Report Status 01/03/2024 FINAL  Final    Organism ID, Bacteria ESCHERICHIA COLI (A)  Final      Susceptibility   Escherichia coli - MIC*    AMPICILLIN  <=2 SENSITIVE Sensitive     CEFAZOLIN <=4 SENSITIVE Sensitive     CEFEPIME  <=0.12 SENSITIVE Sensitive     CEFTRIAXONE  <=0.25 SENSITIVE Sensitive     CIPROFLOXACIN <=0.25 SENSITIVE Sensitive     GENTAMICIN <=1 SENSITIVE Sensitive     IMIPENEM <=0.25 SENSITIVE Sensitive     NITROFURANTOIN <=16 SENSITIVE Sensitive     TRIMETH/SULFA <=20 SENSITIVE Sensitive     AMPICILLIN /SULBACTAM <=2 SENSITIVE Sensitive     PIP/TAZO <=4 SENSITIVE Sensitive ug/mL    * >=100,000 COLONIES/mL ESCHERICHIA COLI  Culture, blood (Routine X 2) w Reflex to ID Panel     Status: None   Collection Time: 01/01/24 11:08 AM   Specimen: BLOOD LEFT HAND  Result Value Ref Range Status   Specimen Description BLOOD LEFT HAND  Final  Special Requests   Final    BOTTLES DRAWN AEROBIC AND ANAEROBIC Blood Culture adequate volume   Culture   Final    NO GROWTH 5 DAYS Performed at [facility], [address] [place], [name] [address]    Report Status 01/06/2024 FINAL  Final  Culture, blood (Routine X 2) w Reflex to ID Panel     Status: None   Collection Time: 01/01/24 11:10 AM   Specimen: BLOOD LEFT HAND  Result Value Ref Range Status   Specimen Description BLOOD LEFT HAND  Final   Special Requests   Final    BOTTLES DRAWN AEROBIC AND ANAEROBIC Blood Culture adequate volume   Culture   Final    NO GROWTH 5 DAYS Performed at [facility], [address], [place], [name] [address]    Report Status 01/06/2024 FINAL  Final  Culture, Respiratory w Gram Stain     Status: None   Collection Time: 01/02/24  5:22 PM   Specimen: Tracheal Aspirate; Respiratory  Result Value Ref Range Status   Specimen Description TRACHEAL ASPIRATE  Final   Special Requests NONE  Final   Gram Stain   Final    RARE WBC PRESENT, PREDOMINANTLY PMN FEW GRAM POSITIVE COCCI RARE GRAM NEGATIVE RODS Performed at [facility], [address], [place], [name] [address]    Culture FEW METHICILLIN RESISTANT STAPHYLOCOCCUS AUREUS  Final   Report Status 01/04/2024 FINAL  Final   Organism ID, Bacteria METHICILLIN RESISTANT STAPHYLOCOCCUS AUREUS  Final      Susceptibility   Methicillin resistant staphylococcus aureus - MIC*    CIPROFLOXACIN <=0.5 SENSITIVE Sensitive     ERYTHROMYCIN  >=8 RESISTANT Resistant     GENTAMICIN <=0.5 SENSITIVE Sensitive     OXACILLIN >=4 RESISTANT Resistant     TETRACYCLINE >=16 RESISTANT Resistant     VANCOMYCIN  1 SENSITIVE Sensitive     TRIMETH/SULFA <=10 SENSITIVE Sensitive     CLINDAMYCIN <=0.25 SENSITIVE Sensitive     RIFAMPIN <=0.5 SENSITIVE Sensitive     Inducible Clindamycin NEGATIVE Sensitive     LINEZOLID  2 SENSITIVE Sensitive     * FEW METHICILLIN RESISTANT STAPHYLOCOCCUS AUREUS         Radiology Studies: No results found.      Scheduled Meds:  amLODipine   10 mg Per Tube Daily   artificial tears   Left Eye Q8H   Chlorhexidine  Gluconate Cloth  6 each Topical Daily   enoxaparin  (LOVENOX ) injection  50 mg Subcutaneous Daily   famotidine   20 mg Per Tube BID   feeding supplement (PROSource TF20)  60 mL Per Tube TID   free water   150 mL Per Tube Q4H   insulin  aspart  0-15 Units Subcutaneous Q4H   metoprolol  tartrate  100 mg Per Tube BID   multivitamin with minerals  1 tablet Per Tube Daily   nutrition supplement (JUVEN)  1 packet Per Tube BID BM   mouth rinse  15 mL Mouth Rinse 4 times per day   Continuous Infusions:  feeding supplement (OSMOLITE 1.5 CAL) 1,000 mL (01/08/24 1829)     LOS: 74 days    Time spent: 45 minutes spent on 01/09/2024 caring for this patient face-to-face including chart review, ordering labs/tests, documenting, discussion with nursing staff, consultants, updating family and interview/physical exam    Rema [facility] [place], DO [facility] Available via Epic secure chat 7am-7pm After these hours, please refer to coverage  provider listed on [email or web address] 01/09/2024, 11:03  AM

## 2024-01-09 NOTE — Progress Notes (Signed)
 Nutrition Follow-up  DOCUMENTATION CODES:   Not applicable  INTERVENTION:   Continue tube feeds via PEG: - Osmolite 1.5 to  50 mL/hr (1200 mL/day) - PROSource TF20 60 mL TID -  Free water  flush 150 mL every 4 hours or per MD   Tube feeding regimen provides 2040 kcal, 135 grams of protein, and 914 ml of H2O.    Total free water  with flushes: 1814 mL   - Continue MVI with minerals daily per tube - Continue 1 packet Juven BID per tube to support wound healing, each packet provides 95 calories, 2.5 grams of protein (collagen), and 9.8 grams of carbohydrate - Recommend updated bed weight   NUTRITION DIAGNOSIS:   Inadequate oral intake related to inability to eat as evidenced by NPO status. - Still applicable   GOAL:   Patient will meet greater than or equal to 90% of their needs - Meeting via TF's  MONITOR:   Labs, Weight trends, TF tolerance, Skin  REASON FOR ASSESSMENT:   Consult Enteral/tube feeding initiation and management  ASSESSMENT:   Pt with PMH of HTN, daily mariajuana, and smoker admitted after being found down at home with pontine hemorrhagic stroke and trace SAH. UDS positive for opioids. Noted aspiration PNA on admission.   3/08 - admitted with SAH, intubated 3/09 - adult TF protocol started 3/14 - s/p cortrak placement (tip gastric) 3/22 - s/p tracheostomy 3/24 - s/p PEG 3/26 - emesis, TF paused 3/28 - trickle TF starated 3/31 - TF off due to ileus 4/02 - trickle TF started  4/04 - TF back at goal 4/08 - episode of vomiting x 2, TF paused then resumed at trickle 4/11 - TF back at goal 4/20 - Cuff changed to cuffless 5/10 - PEG dislodged, TF's stopped  5/11 - PEG replaced, TF's restarted at goal   Pt continues to tolerate TF's. No nausea or vomiting noted. Having bowel movements. SLP continuing to follow. Uses yes/no sign to communicate. SLP working with his communication, using PMV. Attempts to follow commands. Tracks with right eye.   Difficult  dispo due to being trach and PEG tube dependent. Per TOC notes, pt has disability pending but will not know determination until June or July.   Admit weight: 103 kg  Current weight: 93.4 kg - weight carried over x 8  Non pitting edema: RUE, LUE, RLE, LLE   Nutritionally Relevant Medications: Scheduled Meds:  amLODipine   10 mg Per Tube Daily   artificial tears   Left Eye Q8H   Chlorhexidine  Gluconate Cloth  6 each Topical Daily   enoxaparin  (LOVENOX ) injection  50 mg Subcutaneous Daily   famotidine   20 mg Per Tube BID   feeding supplement (PROSource TF20)  60 mL Per Tube TID   free water   150 mL Per Tube Q4H   insulin  aspart  0-15 Units Subcutaneous Q4H   metoprolol  tartrate  100 mg Per Tube BID   multivitamin with minerals  1 tablet Per Tube Daily   nutrition supplement (JUVEN)  1 packet Per Tube BID BM   mouth rinse  15 mL Mouth Rinse 4 times per day   Continuous Infusions:  feeding supplement (OSMOLITE 1.5 CAL) 1,000 mL (01/08/24 1829)    Labs Reviewed: BUN 22, Creatinine 0.59 Albumin  2.8 AST 58 ALT 176 CRP 15.9 CBG ranges from 97-152 mg/dL over the last 24 hours HgbA1c 4.7  Diet Order:   Diet Order             Diet  NPO time specified  Diet effective now                   EDUCATION NEEDS:   No education needs have been identified at this time  Skin:  Skin Assessment: Skin Integrity Issues: Skin Integrity Issues:: Other (Comment) Stage II: Stage 2, perineum Other: Non pressure wound, left eye  Last BM:  01/07/2024 - TYPE 6  Height:   Ht Readings from Last 1 Encounters:  11/22/23 5\' 8"  (1.727 m)    Weight:   Wt Readings from Last 1 Encounters:  01/09/24 93.4 kg    Ideal Body Weight:  70 kg  BMI:  Body mass index is 31.31 kg/m.  Estimated Nutritional Needs:   Kcal:  2000-2200  Protein:  125-140 grams  Fluid:  >2 L/day   Frederik Jansky, RD Registered Dietitian  See Amion for more information

## 2024-01-10 DIAGNOSIS — I613 Nontraumatic intracerebral hemorrhage in brain stem: Secondary | ICD-10-CM | POA: Diagnosis not present

## 2024-01-10 LAB — GLUCOSE, CAPILLARY
Glucose-Capillary: 112 mg/dL — ABNORMAL HIGH (ref 70–99)
Glucose-Capillary: 122 mg/dL — ABNORMAL HIGH (ref 70–99)
Glucose-Capillary: 127 mg/dL — ABNORMAL HIGH (ref 70–99)
Glucose-Capillary: 140 mg/dL — ABNORMAL HIGH (ref 70–99)
Glucose-Capillary: 161 mg/dL — ABNORMAL HIGH (ref 70–99)
Glucose-Capillary: 98 mg/dL (ref 70–99)

## 2024-01-10 NOTE — Progress Notes (Signed)
 Pt transferred to [facility] room 26. All pt belongs transferred with pt. Family at bedside.

## 2024-01-10 NOTE — Progress Notes (Signed)
 PROGRESS NOTE    Garrett [name]  ZOX:[id number] DOB: 02/20/1976 DOA: 10/27/2023 PCP: Pcp, No    Brief Narrative:   Garrett [name] is a 48 y.o. male with no known previous medical history who was found down on 10/27/2023, unresponsive with UDS positive for opiates.  He was minimally responsive to Narcan  and he was found to be markedly hypertensive with BP 260/156.  CT head at intake notable for a 4.1 cm intracranial hemorrhage at the pons with intraventricular extension into the fourth ventricle and basal cisterns.  Initially admitted by critical care, now extubated with tracheostomy initially placed on 11/10/2023.  PEG tube placed 11/12/2023.  Disposition remains difficult given patient's needs with PEG and tracheostomy tube.  Currently awaiting disability.   Significant hospital events: 3/8: Intubated in ED, pontine bleed/SAH. CT head Acute large hemorrhage 4.1 cm in pons with intraventricular extension without hydrocephalus 3/9: CTA No change in IPH in pons, similar biparietal [facility] 3/14: Now awake, appears locked in. can follow commands with eyes. 3/16 Purposeful with left upper extremity  3/22 tracheostomy placed at bedside, bleeding issues overnight from trach site 3/24 PEG (Dr. Hildy Lowers) 3/24 sputum culture with MRSA 4/20: trach was changed to cuffless #8 5/1: Mental status appears to be somewhat improving off narcotics, more awake alert following simple commands(able to grimace, move left toes, track vertically with right eye).  Speech following. Trach downsized to [employer] cuffless #6 XLT 5/4: Modafinil  held, low threshold to resume if mental status/ability to interact diminishes 5/10: PEG dislodged, foley bulb placed and IR consulted to replace tube 5/11: IR replaced PEG tube 5/13 - 5/14: + Fever.  Restarted antibiotics.  Urine culture + E. Coli, foley catheter changed. 5/17 Tracheostomy changed.   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  UDS positive for opiates.  CT head with acute large hemorrhage, 4.1 cm in pons with) ventricular extension without hydronephrosis efflux.  Etiology likely secondary to hypertensive emergency with elevated blood pressure 260/156 at time of admission.  Garrett [name] admitted to the intensive care unit on the [facility] service as well as neurology following.  TTE with LVEF 60 to 65%, hemoglobin A1c 4.7, LDL 96.  Treated with 3% hypertonic saline.  Follow-up with CT head 3/29 with reduction in size of hemorrhage.  Patient underwent tracheostomy placement on 11/10/2023 and PEG tube placement on 11/12/2023. -- Per SLP on 5/19 patient used PMV for duration of session with stable vital signs - achieved spontaneous voicing but could not transition to purposeful voicing despite max cues    Acute respiratory failure 2/2 large pontine stroke/brainstem compression Unable to protect airway Trach initially placed 11/10/23 changed to cuffless 4/21, and exchanged for Shiley cuffless #6 XLT on 12/20/2023. 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 Multiple tracheal aspirate has been growing MRSA. Treated with vancomycin  and then later on was switched to Zyvox  due to persistent fever with recurrent aspiration. -- SLP following -- Aspiration precautions   E. coli UTI: resolved Urine culture + pansensitive E.  coli. Foley catheter was changed 5/13. Antibiotic course completed on 5/18.   Moderate to severe protein  caloric malnutrition, POA Body mass index is 31.02 kg/m. Nutrition Status: Nutrition Problem: Inadequate oral intake Etiology: inability to eat Signs/Symptoms: NPO status Interventions: Tube feeding, Prostat, MVI, Juven -- S/p PEG tube placement, continues on tube feeds    PEG tube malfunction.  Resolved. PEG dislodged 5/10, replaced on 5/11 by IR   Exposure keratopathy, OS Seen by ophthalmology, Dr. Mason Sole on 4/7.  Treated with maxitrol ointment (neo-poly-dex) TID OS for 1 week. -- Refresh PM TID to QID OS for chronic lubrication -- Can tape eyelid closed if still exposure an issue (plastic tape may work better than paper and would try to tape eyelid directly rather than with gauze to keep enough tension to ensure it isn't remaining open)   AKI with acute urinary retention Currently has a Foley catheter. Prior removals/voiding trial unsuccessful -- Last catheter change was on 5/13.   Macrocytic anemia thrombocytosis  Overall stable/resolved  Perineal skin injury, POA  Continue Gerhard's Butt cream  Relative B12 and folate deficiency Continue replacement with MVI   Goals of care conversation.   Switch to DNR on 3/13.    DVT prophylaxis: SCDs Start: 10/27/23 2226    Code Status: Do not attempt resuscitation (DNR) PRE-ARREST INTERVENTIONS DESIRED Family Communication: Updated fianc present at bedside this morning  Disposition Plan:  Level of care: Telemetry Medical Status is: Inpatient Remains inpatient appropriate because: Difficult disposition per [facility]    Consultants:  PCCM Neurology Palliative Care General Surgery for PEG tube placement Ophthalmology, Dr. Mason Sole Interventional radiology for G-tube exchange 5/11  Procedures:  EEG 3/10 Percutaneous tracheostomy, Dr. Zaida Hertz 3/22 Noster bronchoscopy 3/22; Dr. Diania Fortes PEG tube placement, Dr. Hildy Lowers 3/24 PEG tube exchange, IR 5/11  Antimicrobials:  Ceftriaxone  5/13 - 5/18 Vancomycin  3/24 - 4/8, 5/14 -  5/16 Cefepime  5/14 - 5/15 Linezolid  4/9 - 4/15 Unasyn  3/23 - 3/24   Subjective: Patient seen examined bedside, lying in bed.  Alert but poorly interactive.  Updated fianc present at bedside this morning.  Long discussion regarding his overall poor prognosis.  She states "I had a vision that on July 4, he will remarkably recover".  "I only believe in higher power".  Unable to obtain any further ROS from patient due to his current condition/mental status.  No acute issues overnight per nursing staff.  Objective: Vitals:   01/10/24 0813 01/10/24 0840 01/10/24 0846 01/10/24 0906  BP: (!) 85/63 (!) 160/115 (!) 147/104   Pulse: 80 89 88 88  Resp: 19  18 19   Temp: 98.3 F (36.8 C)     TempSrc:      SpO2: 100% 100% 100% 99%  Weight:      Height:        Intake/Output Summary (Last 24 hours) at 01/10/2024 1041 Last data filed at 01/10/2024 0100 Gross per 24 hour  Intake --  Output 750 ml  Net -750 ml   Filed Weights   01/08/24 0500 01/09/24 0453 01/10/24 0401  Weight: 93.4 kg 93.4 kg 92.5 kg    Examination:  Physical Exam: GEN: NAD, awake HEENT: NCAT, sclera clear, left eye with patch in place, trach noted PULM: Diminished breath sounds bilateral bases, no wheezing/crackles, normal respiratory effort without accessory muscle use, on 6 L trach collar CV: RRR w/o M/G/R GI: abd soft, NTND, + BS; PEG tube noted GU: Foley catheter noted with clear yellow urine in collection bag MSK: Right hand/wrist with  splint in place    Data Reviewed: I have personally reviewed following labs and imaging studies  CBC: Recent Labs  Lab 01/04/24 0604 01/05/24 0515 01/08/24 0711  WBC 9.1 6.6 10.2  NEUTROABS  --   --  6.2  HGB 10.8* 11.4* 11.0*  HCT 33.3* 35.3* 34.9*  MCV 91.7 92.4 92.8  PLT 410* 415* 560*   Basic Metabolic Panel: Recent Labs  Lab 01/04/24 0604 01/05/24 0515 01/08/24 0711  NA 134* 135 136  K 3.2* 3.8 4.0  CL 102 100 101  CO2 23 23 26   GLUCOSE 135* 141* 136*   BUN 14 19 22*  CREATININE 0.61 0.53* 0.59*  CALCIUM 8.5* 8.9 9.0  MG 1.9 2.0 2.1   GFR: Estimated Creatinine Clearance: 125.9 mL/min (A) (by C-G formula based on SCr of 0.59 mg/dL (L)). Liver Function Tests: Recent Labs  Lab 01/08/24 0711  AST 58*  ALT 176*  ALKPHOS 60  BILITOT 0.4  PROT 7.2  ALBUMIN  2.8*   No results for input(s): "LIPASE", "AMYLASE" in the last 168 hours. No results for input(s): "AMMONIA" in the last 168 hours. Coagulation Profile: No results for input(s): "INR", "PROTIME" in the last 168 hours. Cardiac Enzymes: No results for input(s): "CKTOTAL", "CKMB", "CKMBINDEX", "TROPONINI" in the last 168 hours. BNP (last 3 results) No results for input(s): "PROBNP" in the last 8760 hours. HbA1C: No results for input(s): "HGBA1C" in the last 72 hours. CBG: Recent Labs  Lab 01/09/24 1535 01/09/24 2049 01/09/24 2314 01/10/24 0436 01/10/24 0812  GLUCAP 151* 142* 97 112* 161*   Lipid Profile: No results for input(s): "CHOL", "HDL", "LDLCALC", "TRIG", "CHOLHDL", "LDLDIRECT" in the last 72 hours. Thyroid Function Tests: No results for input(s): "TSH", "T4TOTAL", "FREET4", "T3FREE", "THYROIDAB" in the last 72 hours. Anemia Panel: No results for input(s): "VITAMINB12", "FOLATE", "FERRITIN", "TIBC", "IRON", "RETICCTPCT" in the last 72 hours. Sepsis Labs: No results for input(s): "PROCALCITON", "LATICACIDVEN" in the last 168 hours.   Recent Results (from the past 240 hours)  Remove and replace urinary cath (placed > 5 days) then obtain urine culture from new indwelling urinary catheter.     Status: Abnormal   Collection Time: 01/01/24 10:58 AM   Specimen: Urine, Catheterized  Result Value Ref Range Status   Specimen Description URINE, CATHETERIZED  Final   Special Requests   Final    NONE Performed at [facility], [address] [place], [name] [address]    Culture >=100,000 COLONIES/mL ESCHERICHIA COLI (A)  Final   Report Status 01/03/2024 FINAL   Final   Organism ID, Bacteria ESCHERICHIA COLI (A)  Final      Susceptibility   Escherichia coli - MIC*    AMPICILLIN  <=2 SENSITIVE Sensitive     CEFAZOLIN <=4 SENSITIVE Sensitive     CEFEPIME  <=0.12 SENSITIVE Sensitive     CEFTRIAXONE  <=0.25 SENSITIVE Sensitive     CIPROFLOXACIN <=0.25 SENSITIVE Sensitive     GENTAMICIN <=1 SENSITIVE Sensitive     IMIPENEM <=0.25 SENSITIVE Sensitive     NITROFURANTOIN <=16 SENSITIVE Sensitive     TRIMETH/SULFA <=20 SENSITIVE Sensitive     AMPICILLIN /SULBACTAM <=2 SENSITIVE Sensitive     PIP/TAZO <=4 SENSITIVE Sensitive ug/mL    * >=100,000 COLONIES/mL ESCHERICHIA COLI  Culture, blood (Routine X 2) w Reflex to ID Panel     Status: None   Collection Time: 01/01/24 11:08 AM   Specimen: BLOOD LEFT HAND  Result Value Ref Range Status   Specimen Description BLOOD LEFT HAND  Final   Special Requests   Final    BOTTLES DRAWN AEROBIC AND ANAEROBIC Blood Culture adequate volume   Culture   Final    NO GROWTH 5 DAYS Performed at [facility], [address], [place], [name] [address]    Report Status 01/06/2024 FINAL  Final  Culture, blood (Routine X 2) w Reflex to ID Panel     Status: None   Collection Time: 01/01/24 11:10 AM   Specimen: BLOOD LEFT HAND  Result Value Ref Range Status   Specimen Description BLOOD LEFT HAND  Final   Special Requests   Final    BOTTLES DRAWN AEROBIC AND ANAEROBIC Blood Culture adequate volume   Culture   Final    NO GROWTH 5 DAYS Performed at [facility], [address], [place], [name] [address]    Report Status 01/06/2024 FINAL  Final  Culture, Respiratory w Gram Stain     Status: None   Collection Time: 01/02/24  5:22 PM   Specimen: Tracheal Aspirate; Respiratory  Result Value Ref Range Status   Specimen Description TRACHEAL ASPIRATE  Final   Special Requests NONE  Final   Gram Stain   Final    RARE WBC PRESENT, PREDOMINANTLY PMN FEW GRAM POSITIVE COCCI RARE GRAM NEGATIVE RODS Performed at  [facility], [address] [place], [name] [address]    Culture FEW METHICILLIN RESISTANT STAPHYLOCOCCUS AUREUS  Final   Report Status 01/04/2024 FINAL  Final   Organism ID, Bacteria METHICILLIN RESISTANT STAPHYLOCOCCUS AUREUS  Final      Susceptibility   Methicillin resistant staphylococcus aureus - MIC*    CIPROFLOXACIN <=0.5 SENSITIVE Sensitive     ERYTHROMYCIN  >=8 RESISTANT Resistant     GENTAMICIN <=0.5 SENSITIVE Sensitive     OXACILLIN >=4 RESISTANT Resistant     TETRACYCLINE >=16 RESISTANT Resistant     VANCOMYCIN  1 SENSITIVE Sensitive     TRIMETH/SULFA <=10 SENSITIVE Sensitive     CLINDAMYCIN <=0.25 SENSITIVE Sensitive     RIFAMPIN <=0.5 SENSITIVE Sensitive     Inducible Clindamycin NEGATIVE Sensitive     LINEZOLID  2 SENSITIVE Sensitive     * FEW METHICILLIN RESISTANT STAPHYLOCOCCUS AUREUS         Radiology Studies: No results found.      Scheduled Meds:  amLODipine   10 mg Per Tube Daily   artificial tears   Left Eye Q8H   Chlorhexidine  Gluconate Cloth  6 each Topical Daily   enoxaparin  (LOVENOX ) injection  50 mg Subcutaneous Daily   famotidine   20 mg Per Tube BID   feeding supplement (PROSource TF20)  60 mL Per Tube TID   free water   150 mL Per Tube Q4H   insulin  aspart  0-15 Units Subcutaneous Q4H   metoprolol  tartrate  100 mg Per Tube BID   multivitamin with minerals  1 tablet Per Tube Daily   nutrition supplement (JUVEN)  1 packet Per Tube BID BM   mouth rinse  15 mL Mouth Rinse 4 times per day   Continuous Infusions:  feeding supplement (OSMOLITE 1.5 CAL) 1,000 mL (01/10/24 0414)     LOS: 75 days    Time spent: 45 minutes spent on 01/10/2024 caring for this patient face-to-face including chart review, ordering labs/tests, documenting, discussion with nursing staff, consultants, updating family and interview/physical exam    Rema [facility] [place], DO [facility] Available via Epic secure chat 7am-7pm After these hours, please refer to  coverage provider listed on  [email or web address] 01/10/2024, 10:41 AM

## 2024-01-10 NOTE — TOC Progression Note (Signed)
 Transition of Care [facility]) - Progression Note    Patient Details  Name: Garrett [name] MRN: [id number] Date of Birth: 1976/07/24  Transition of Care [facility]) CM/SW Contact  Jannice Mends, LCSW Phone Number: 01/10/2024, 1:29 PM  Clinical Narrative:    CSW received call from patient's fiance requesting an update. CSW provided update and patient is awaiting Disability approval for placement.    Expected Discharge Plan: Long Term Nursing Home Barriers to Discharge: Continued Medical Work up, SNF Pending Medicaid, SNF Pending bed offer, SNF Pending payor source - LOG, Inadequate or no insurance (New trach)  Expected Discharge Plan and Services In-house Referral: Clinical Social Work, Hospice / Palliative Care   Post Acute Care Choice: Skilled Nursing Facility Living arrangements for the past 2 months: Single Family Home                                       Social Determinants of Health (SDOH) Interventions SDOH Screenings   Food Insecurity: Patient Unable To Answer (10/29/2023)  Social Connections: Unknown (06/23/2023)   Received from [facility]  Tobacco Use: High Risk (10/31/2023)    Readmission Risk Interventions     No data to display

## 2024-01-10 NOTE — Plan of Care (Signed)
  Problem: Coping: Goal: Will verbalize positive feelings about self Outcome: Progressing   Problem: Health Behavior/Discharge Planning: Goal: Ability to manage health-related needs will improve Outcome: Progressing   Problem: Self-Care: Goal: Ability to participate in self-care as condition permits will improve Outcome: Progressing

## 2024-01-11 DIAGNOSIS — I613 Nontraumatic intracerebral hemorrhage in brain stem: Secondary | ICD-10-CM | POA: Diagnosis not present

## 2024-01-11 LAB — GLUCOSE, CAPILLARY
Glucose-Capillary: 142 mg/dL — ABNORMAL HIGH (ref 70–99)
Glucose-Capillary: 147 mg/dL — ABNORMAL HIGH (ref 70–99)
Glucose-Capillary: 127 mg/dL — ABNORMAL HIGH (ref 70–99)
Glucose-Capillary: 127 mg/dL — ABNORMAL HIGH (ref 70–99)
Glucose-Capillary: 124 mg/dL — ABNORMAL HIGH (ref 70–99)

## 2024-01-11 NOTE — Plan of Care (Signed)
  Problem: Health Behavior/Discharge Planning: Goal: Goals will be collaboratively established with patient/family Outcome: Progressing   Problem: Nutrition: Goal: Dietary intake will improve Outcome: Progressing   Problem: Nutritional: Goal: Maintenance of adequate nutrition will improve Outcome: Progressing   Problem: Education: Goal: Knowledge of disease or condition will improve Outcome: Not Progressing Goal: Knowledge of secondary prevention will improve (MUST DOCUMENT ALL) Outcome: Not Progressing   Problem: Intracerebral Hemorrhage Tissue Perfusion: Goal: Complications of Intracerebral Hemorrhage will be minimized Outcome: Not Progressing   Problem: Self-Care: Goal: Ability to participate in self-care as condition permits will improve Outcome: Not Progressing Goal: Ability to communicate needs accurately will improve Outcome: Not Progressing   Problem: Nutrition: Goal: Risk of aspiration will decrease Outcome: Not Progressing   Problem: Coping: Goal: Ability to adjust to condition or change in health will improve Outcome: Not Progressing   Problem: Nutritional: Goal: Progress toward achieving an optimal weight will improve Outcome: Not Progressing   Problem: Clinical Measurements: Goal: Respiratory complications will improve Outcome: Not Progressing

## 2024-01-11 NOTE — Progress Notes (Signed)
 PROGRESS NOTE    Garrett [name]  VOZ:[id number] DOB: 09-Aug-1976 DOA: 10/27/2023 PCP: Pcp, No    Brief Narrative:   Garrett [name] is a 48 y.o. male with no known previous medical history who was found down on 10/27/2023, unresponsive with UDS positive for opiates.  He was minimally responsive to Narcan  and he was found to be markedly hypertensive with BP 260/156.  CT head at intake notable for a 4.1 cm intracranial hemorrhage at the pons with intraventricular extension into the fourth ventricle and basal cisterns.  Initially admitted by critical care, now extubated with tracheostomy initially placed on 11/10/2023.  PEG tube placed 11/12/2023.  Disposition remains difficult given patient's needs with PEG and tracheostomy tube.  Currently awaiting disability.   Significant hospital events: 3/8: Intubated in ED, pontine bleed/SAH. CT head Acute large hemorrhage 4.1 cm in pons with intraventricular extension without hydrocephalus 3/9: CTA No change in IPH in pons, similar biparietal [facility] 3/14: Now awake, appears locked in. can follow commands with eyes. 3/16 Purposeful with left upper extremity  3/22 tracheostomy placed at bedside, bleeding issues overnight from trach site 3/24 PEG (Dr. Hildy Lowers) 3/24 sputum culture with MRSA 4/20: trach was changed to cuffless #8 5/1: Mental status appears to be somewhat improving off narcotics, more awake alert following simple commands(able to grimace, move left toes, track vertically with right eye).  Speech following. Trach downsized to [employer] cuffless #6 XLT 5/4: Modafinil  held, low threshold to resume if mental status/ability to interact diminishes 5/10: PEG dislodged, foley bulb placed and IR consulted to replace tube 5/11: IR replaced PEG tube 5/13 - 5/14: + Fever.  Restarted antibiotics.  Urine culture + E. Coli, foley catheter changed. 5/17 Tracheostomy changed.   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  UDS positive for opiates.  CT head with acute large hemorrhage, 4.1 cm in pons with) ventricular extension without hydronephrosis efflux.  Etiology likely secondary to hypertensive emergency with elevated blood pressure 260/156 at time of admission.  [name] admitted to the intensive care unit on the [facility] service as well as neurology following.  TTE with LVEF 60 to 65%, hemoglobin A1c 4.7, LDL 96.  Treated with 3% hypertonic saline.  Follow-up with CT head 3/29 with reduction in size of hemorrhage.  Patient underwent tracheostomy placement on 11/10/2023 and PEG tube placement on 11/12/2023. -- Per SLP on 5/19 patient used PMV for duration of session with stable vital signs - achieved spontaneous voicing but could not transition to purposeful voicing despite max cues    Acute respiratory failure 2/2 large pontine stroke/brainstem compression Unable to protect airway Trach initially placed 11/10/23 changed to cuffless 4/21, and exchanged for Shiley cuffless #6 XLT on 12/20/2023. 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 Multiple tracheal aspirate has been growing MRSA. Treated with vancomycin  and then later on was switched to Zyvox  due to persistent fever with recurrent aspiration. -- SLP following -- Aspiration precautions  E. coli UTI: resolved Urine culture + pansensitive E. coli. Foley catheter was changed 5/13.  Antibiotic course completed on 5/18.   Moderate to severe protein caloric malnutrition, POA Body mass index is 31.32 kg/m. Nutrition Status: Nutrition Problem: Inadequate oral intake Etiology: inability to eat Signs/Symptoms: NPO status Interventions: Tube feeding, Prostat, MVI, Juven -- S/p PEG tube placement, continues on tube feeds    PEG tube malfunction.  Resolved. PEG dislodged 5/10, replaced on 5/11 by IR   Exposure keratopathy, OS Seen by ophthalmology, Dr. Mason Sole on 4/7.  Treated with maxitrol ointment (neo-poly-dex) TID OS for 1 week. -- Refresh PM TID to QID OS for chronic lubrication -- Can tape eyelid closed if still exposure an issue (plastic tape may work better than paper and would try to tape eyelid directly rather than with gauze to keep enough tension to ensure it isn't remaining open)   AKI with acute urinary retention Currently has a Foley catheter. Prior removals/voiding trial unsuccessful -- Last catheter change was on 5/13.   Macrocytic anemia thrombocytosis  Overall stable/resolved  Perineal skin injury, POA  Continue Gerhard's Butt cream  Relative B12 and folate deficiency Continue replacement with MVI   Goals of care conversation.   Switch to DNR on 3/13.    DVT prophylaxis: SCDs Start: 10/27/23 2226    Code Status: Do not attempt resuscitation (DNR) PRE-ARREST INTERVENTIONS DESIRED Family Communication: No family present at bedside this morning  Disposition Plan:  Level of care: Telemetry Medical Status is: Inpatient Remains inpatient appropriate because: Difficult disposition per [facility]    Consultants:  PCCM Neurology Palliative Care General Surgery for PEG tube placement Ophthalmology, Dr. Mason Sole Interventional radiology for G-tube exchange 5/11  Procedures:  EEG 3/10 Percutaneous tracheostomy, Dr. Zaida Hertz 3/22 Noster bronchoscopy 3/22; Dr. Diania Fortes PEG tube placement, Dr. Hildy Lowers 3/24 PEG tube exchange, IR 5/11  Antimicrobials:   Ceftriaxone  5/13 - 5/18 Vancomycin  3/24 - 4/8, 5/14 - 5/16 Cefepime  5/14 - 5/15 Linezolid  4/9 - 4/15 Unasyn  3/23 - 3/24   Subjective: Patient seen examined bedside, lying in bed.  Sleeping, arousable, remains poorly interactive.  No family present at bedside this morning. Unable to obtain any further ROS from patient due to his current condition/mental status.  No acute issues overnight per nursing staff.  Objective: Vitals:   01/11/24 0335 01/11/24 0450 01/11/24 0500 01/11/24 0840  BP:  (!) 128/100    Pulse: 82 86  85  Resp: 18   18  Temp:  98.8 F (37.1 C)    TempSrc:  Axillary    SpO2: 97% 96%  100%  Weight:   93.4 kg   Height:        Intake/Output Summary (Last 24 hours) at 01/11/2024 1010 Last data filed at 01/10/2024 1855 Gross per 24 hour  Intake --  Output 700 ml  Net -700 ml   Filed Weights   01/09/24 0453 01/10/24 0401 01/11/24 0500  Weight: 93.4 kg 92.5 kg 93.4 kg    Examination:  Physical Exam: GEN: NAD, awake HEENT: NCAT, sclera clear, left eye with patch in place, trach noted PULM: Diminished breath sounds bilateral bases, no wheezing/crackles, normal respiratory effort without accessory muscle use, on 6 L trach collar CV: RRR w/o M/G/R GI: abd soft, NTND, + BS; PEG tube noted GU: Foley catheter noted with clear yellow urine in collection bag MSK: Right hand/wrist with splint in place    Data Reviewed: I have personally reviewed following labs and imaging studies  CBC: Recent Labs  Lab 01/05/24 0515 01/08/24 0711  WBC 6.6 10.2  NEUTROABS  --  6.2  HGB 11.4* 11.0*  HCT 35.3* 34.9*  MCV 92.4 92.8  PLT 415* 560*   Basic Metabolic Panel: Recent Labs  Lab 01/05/24 0515 01/08/24 0711  NA 135 136  K 3.8 4.0  CL 100 101  CO2 23 26  GLUCOSE 141* 136*  BUN 19 22*  CREATININE 0.53* 0.59*  CALCIUM 8.9 9.0  MG 2.0 2.1   GFR: Estimated Creatinine Clearance: 126.6 mL/min (A) (by C-G formula based on SCr of 0.59 mg/dL (L)). Liver Function  Tests: Recent Labs  Lab 01/08/24 0711  AST 58*  ALT 176*  ALKPHOS 60  BILITOT 0.4  PROT 7.2  ALBUMIN  2.8*   No results for input(s): "LIPASE", "AMYLASE" in the last 168 hours. No results for input(s): "AMMONIA" in the last 168 hours. Coagulation Profile: No results for input(s): "INR", "PROTIME" in the last 168 hours. Cardiac Enzymes: No results for input(s): "CKTOTAL", "CKMB", "CKMBINDEX", "TROPONINI" in the last 168 hours. BNP (last 3 results) No results for input(s): "PROBNP" in the last 8760 hours. HbA1C: No results for input(s): "HGBA1C" in the last 72 hours. CBG: Recent Labs  Lab 01/10/24 1618 01/10/24 2058 01/10/24 2337 01/11/24 0448 01/11/24 0818  GLUCAP 127* 122* 98 142* 147*   Lipid Profile: No results for input(s): "CHOL", "HDL", "LDLCALC", "TRIG", "CHOLHDL", "LDLDIRECT" in the last 72 hours. Thyroid Function Tests: No results for input(s): "TSH", "T4TOTAL", "FREET4", "T3FREE", "THYROIDAB" in the last 72 hours. Anemia Panel: No results for input(s): "VITAMINB12", "FOLATE", "FERRITIN", "TIBC", "IRON", "RETICCTPCT" in the last 72 hours. Sepsis Labs: No results for input(s): "PROCALCITON", "LATICACIDVEN" in the last 168 hours.   Recent Results (from the past 240 hours)  Remove and replace urinary cath (placed > 5 days) then obtain urine culture from new indwelling urinary catheter.     Status: Abnormal   Collection Time: 01/01/24 10:58 AM   Specimen: Urine, Catheterized  Result Value Ref Range Status   Specimen Description URINE, CATHETERIZED  Final   Special Requests   Final    NONE Performed at [facility], [address], [place], [name] [address]    Culture >=100,000 COLONIES/mL ESCHERICHIA COLI (A)  Final   Report Status 01/03/2024 FINAL  Final   Organism ID, Bacteria ESCHERICHIA COLI (A)  Final      Susceptibility   Escherichia coli - MIC*    AMPICILLIN  <=2 SENSITIVE Sensitive     CEFAZOLIN <=4 SENSITIVE Sensitive     CEFEPIME  <=0.12  SENSITIVE Sensitive     CEFTRIAXONE  <=0.25 SENSITIVE Sensitive     CIPROFLOXACIN <=0.25 SENSITIVE Sensitive     GENTAMICIN <=1 SENSITIVE Sensitive     IMIPENEM <=0.25 SENSITIVE Sensitive     NITROFURANTOIN <=16 SENSITIVE Sensitive     TRIMETH/SULFA <=20 SENSITIVE Sensitive     AMPICILLIN /SULBACTAM <=2 SENSITIVE Sensitive     PIP/TAZO <=4 SENSITIVE Sensitive ug/mL    * >=100,000 COLONIES/mL ESCHERICHIA COLI  Culture, blood (Routine X 2) w Reflex to ID Panel     Status: None   Collection Time: 01/01/24 11:08 AM   Specimen: BLOOD LEFT HAND  Result Value Ref Range Status   Specimen Description BLOOD LEFT HAND  Final   Special Requests   Final    BOTTLES DRAWN AEROBIC AND ANAEROBIC Blood Culture adequate volume   Culture   Final    NO GROWTH 5 DAYS Performed at [facility], [address],  [place], [name] [address]    Report Status 01/06/2024 FINAL  Final  Culture, blood (Routine X 2) w Reflex to ID Panel     Status: None   Collection Time: 01/01/24 11:10 AM   Specimen: BLOOD LEFT HAND  Result Value Ref Range Status   Specimen Description BLOOD LEFT HAND  Final   Special Requests   Final    BOTTLES DRAWN AEROBIC AND ANAEROBIC Blood Culture adequate volume   Culture   Final    NO GROWTH 5 DAYS Performed at [facility], [address] [place], [name] [address]    Report Status 01/06/2024 FINAL  Final  Culture, Respiratory w Gram Stain     Status: None   Collection Time: 01/02/24  5:22 PM   Specimen: Tracheal Aspirate; Respiratory  Result Value Ref Range Status   Specimen Description TRACHEAL ASPIRATE  Final   Special Requests NONE  Final   Gram Stain   Final    RARE WBC PRESENT, PREDOMINANTLY PMN FEW GRAM POSITIVE COCCI RARE GRAM NEGATIVE RODS Performed at [facility], [address], [place], [name] [address]    Culture FEW METHICILLIN RESISTANT STAPHYLOCOCCUS AUREUS  Final   Report Status 01/04/2024 FINAL  Final   Organism ID, Bacteria METHICILLIN  RESISTANT STAPHYLOCOCCUS AUREUS  Final      Susceptibility   Methicillin resistant staphylococcus aureus - MIC*    CIPROFLOXACIN <=0.5 SENSITIVE Sensitive     ERYTHROMYCIN  >=8 RESISTANT Resistant     GENTAMICIN <=0.5 SENSITIVE Sensitive     OXACILLIN >=4 RESISTANT Resistant     TETRACYCLINE >=16 RESISTANT Resistant     VANCOMYCIN  1 SENSITIVE Sensitive     TRIMETH/SULFA <=10 SENSITIVE Sensitive     CLINDAMYCIN <=0.25 SENSITIVE Sensitive     RIFAMPIN <=0.5 SENSITIVE Sensitive     Inducible Clindamycin NEGATIVE Sensitive     LINEZOLID  2 SENSITIVE Sensitive     * FEW METHICILLIN RESISTANT STAPHYLOCOCCUS AUREUS         Radiology Studies: No results found.      Scheduled Meds:  amLODipine   10 mg Per Tube Daily   artificial tears   Left Eye Q8H   Chlorhexidine  Gluconate Cloth  6 each Topical Daily   enoxaparin  (LOVENOX ) injection  50 mg Subcutaneous Daily   famotidine   20 mg Per Tube BID   feeding supplement (PROSource TF20)  60 mL Per Tube TID   free water   150 mL Per Tube Q4H   insulin  aspart  0-15 Units Subcutaneous Q4H   metoprolol  tartrate  100 mg Per Tube BID   multivitamin with minerals  1 tablet Per Tube Daily   nutrition supplement (JUVEN)  1 packet Per Tube BID BM   mouth rinse  15 mL Mouth Rinse 4 times per day   Continuous Infusions:  feeding supplement (OSMOLITE 1.5 CAL) 1,000 mL (01/10/24 0414)     LOS: 76 days    Time spent: 45 minutes spent on 01/11/2024 caring for this patient face-to-face including chart review, ordering labs/tests, documenting, discussion with nursing staff, consultants, updating family and interview/physical exam    [facility] [place], DO [facility] Available via Epic secure chat 7am-7pm After these hours, please refer to coverage provider listed on [email or web address] 01/11/2024, 10:10 AM

## 2024-01-12 DIAGNOSIS — I613 Nontraumatic intracerebral hemorrhage in brain stem: Secondary | ICD-10-CM | POA: Diagnosis not present

## 2024-01-12 LAB — GLUCOSE, CAPILLARY
Glucose-Capillary: 102 mg/dL — ABNORMAL HIGH (ref 70–99)
Glucose-Capillary: 110 mg/dL — ABNORMAL HIGH (ref 70–99)
Glucose-Capillary: 122 mg/dL — ABNORMAL HIGH (ref 70–99)
Glucose-Capillary: 143 mg/dL — ABNORMAL HIGH (ref 70–99)
Glucose-Capillary: 145 mg/dL — ABNORMAL HIGH (ref 70–99)

## 2024-01-12 NOTE — Progress Notes (Signed)
 PROGRESS NOTE    Garrett [name]  ZOX:[id number] DOB: Nov 08, 1975 DOA: 10/27/2023 PCP: Pcp, No    Brief Narrative:   Garrett [name] is a 48 y.o. male with no known previous medical history who was found down on 10/27/2023, unresponsive with UDS positive for opiates.  He was minimally responsive to Narcan  and he was found to be markedly hypertensive with BP 260/156.  CT head at intake notable for a 4.1 cm intracranial hemorrhage at the pons with intraventricular extension into the fourth ventricle and basal cisterns.  Initially admitted by critical care, now extubated with tracheostomy initially placed on 11/10/2023.  PEG tube placed 11/12/2023.  Disposition remains difficult given patient's needs with PEG and tracheostomy tube.  Currently awaiting disability.   Significant hospital events: 3/8: Intubated in ED, pontine bleed/SAH. CT head Acute large hemorrhage 4.1 cm in pons with intraventricular extension without hydrocephalus 3/9: CTA No change in IPH in pons, similar biparietal [facility] 3/14: Now awake, appears locked in. can follow commands with eyes. 3/16 Purposeful with left upper extremity  3/22 tracheostomy placed at bedside, bleeding issues overnight from trach site 3/24 PEG (Dr. Hildy Lowers) 3/24 sputum culture with MRSA 4/20: trach was changed to cuffless #8 5/1: Mental status appears to be somewhat improving off narcotics, more awake alert following simple commands(able to grimace, move left toes, track vertically with right eye).  Speech following. Trach downsized to [employer] cuffless #6 XLT 5/4: Modafinil  held, low threshold to resume if mental status/ability to interact diminishes 5/10: PEG dislodged, foley bulb placed and IR consulted to replace tube 5/11: IR replaced PEG tube 5/13 - 5/14: + Fever.  Restarted antibiotics.  Urine culture + E. Coli, foley catheter changed. 5/17 Tracheostomy changed.   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  UDS positive for opiates.  CT head with acute large hemorrhage, 4.1 cm in pons with) ventricular extension without hydronephrosis efflux.  Etiology likely secondary to hypertensive emergency with elevated blood pressure 260/156 at time of admission.  [name] admitted to the intensive care unit on the [facility] service as well as neurology following.  TTE with LVEF 60 to 65%, hemoglobin A1c 4.7, LDL 96.  Treated with 3% hypertonic saline.  Follow-up with CT head 3/29 with reduction in size of hemorrhage.  Patient underwent tracheostomy placement on 11/10/2023 and PEG tube placement on 11/12/2023. -- Per SLP on 5/19 patient used PMV for duration of session with stable vital signs - achieved spontaneous voicing but could not transition to purposeful voicing despite max cues    Acute respiratory failure 2/2 large pontine stroke/brainstem compression Unable to protect airway Trach initially placed 11/10/23 changed to cuffless 4/21, and exchanged for Shiley cuffless #6 XLT on 12/20/2023. 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 Multiple tracheal aspirate has been growing MRSA. Treated with vancomycin  and then later on was switched to Zyvox  due to persistent fever with recurrent aspiration. -- SLP following -- Aspiration precautions  E. coli UTI: resolved Urine culture + pansensitive E. coli. Foley catheter was changed 5/13.  Antibiotic course completed on 5/18.   Moderate to severe protein caloric malnutrition, POA Body mass index is 31.32 kg/m. Nutrition Status: Nutrition Problem: Inadequate oral intake Etiology: inability to eat Signs/Symptoms: NPO status Interventions: Tube feeding, Prostat, MVI, Juven -- S/p PEG tube placement, continues on tube feeds    PEG tube malfunction.  Resolved. PEG dislodged 5/10, replaced on 5/11 by IR   Exposure keratopathy, OS Seen by ophthalmology, Dr. Mason Sole on 4/7.  Treated with maxitrol ointment (neo-poly-dex) TID OS for 1 week. -- Refresh PM TID to QID OS for chronic lubrication -- Can tape eyelid closed if still exposure an issue (plastic tape may work better than paper and would try to tape eyelid directly rather than with gauze to keep enough tension to ensure it isn't remaining open)   AKI with acute urinary retention Currently has a Foley catheter. Prior removals/voiding trial unsuccessful -- Last catheter change was on 5/13.   Macrocytic anemia thrombocytosis  Overall stable/resolved  Perineal skin injury, POA  Continue Gerhard's Butt cream  Relative B12 and folate deficiency Continue replacement with MVI   Goals of care conversation.   Switch to DNR on 3/13.    DVT prophylaxis: SCDs Start: 10/27/23 2226    Code Status: Do not attempt resuscitation (DNR) PRE-ARREST INTERVENTIONS DESIRED Family Communication: No family present at bedside this morning  Disposition Plan:  Level of care: Telemetry Medical Status is: Inpatient Remains inpatient appropriate because: Difficult disposition per [facility]    Consultants:  PCCM Neurology Palliative Care General Surgery for PEG tube placement Ophthalmology, Dr. Mason Sole Interventional radiology for G-tube exchange 5/11  Procedures:  EEG 3/10 Percutaneous tracheostomy, Dr. Zaida Hertz 3/22 Noster bronchoscopy 3/22; Dr. Diania Fortes PEG tube placement, Dr. Hildy Lowers 3/24 PEG tube exchange, IR 5/11  Antimicrobials:   Ceftriaxone  5/13 - 5/18 Vancomycin  3/24 - 4/8, 5/14 - 5/16 Cefepime  5/14 - 5/15 Linezolid  4/9 - 4/15 Unasyn  3/23 - 3/24   Subjective: Patient seen examined bedside, lying in bed.  Sleeping, arousable, remains poorly interactive.  No family present at bedside this morning. Unable to obtain any further ROS from patient due to his current condition/mental status.  No acute issues overnight per nursing staff.  Objective: Vitals:   01/12/24 0352 01/12/24 0426 01/12/24 0748 01/12/24 0831  BP:  (!) 150/101 (!) 155/114   Pulse: 70 83 86 81  Resp: 16 16 16 19   Temp:  98.2 F (36.8 C) 98.7 F (37.1 C)   TempSrc:  Axillary Axillary   SpO2:  100%  99%  Weight:      Height:        Intake/Output Summary (Last 24 hours) at 01/12/2024 1002 Last data filed at 01/12/2024 0430 Gross per 24 hour  Intake 1400 ml  Output 1550 ml  Net -150 ml   Filed Weights   01/09/24 0453 01/10/24 0401 01/11/24 0500  Weight: 93.4 kg 92.5 kg 93.4 kg    Examination:  Physical Exam: GEN: NAD, awake HEENT: NCAT, sclera clear, left eye with patch in place, trach noted PULM: Diminished breath sounds bilateral bases, no wheezing/crackles, normal respiratory effort without accessory muscle use, on 6 L trach collar CV: RRR w/o M/G/R GI: abd soft, NTND, + BS; PEG tube noted GU: Foley catheter noted with clear yellow urine in collection bag MSK: Right hand/wrist with splint in place    Data Reviewed: I have personally reviewed following labs and imaging studies  CBC: Recent Labs  Lab 01/08/24 0711  WBC 10.2  NEUTROABS 6.2  HGB 11.0*  HCT 34.9*  MCV 92.8  PLT 560*   Basic Metabolic Panel: Recent Labs  Lab 01/08/24 0711  NA 136  K 4.0  CL 101  CO2 26  GLUCOSE 136*  BUN 22*  CREATININE 0.59*  CALCIUM 9.0  MG 2.1   GFR: Estimated Creatinine Clearance: 126.6 mL/min (A) (by C-G formula based on SCr of 0.59 mg/dL (L)). Liver Function Tests: Recent Labs  Lab 01/08/24 0711  AST 58*  ALT  176*  ALKPHOS 60  BILITOT 0.4  PROT 7.2  ALBUMIN  2.8*   No results for input(s): "LIPASE", "AMYLASE" in the last 168 hours. No results for input(s): "AMMONIA" in the last 168 hours. Coagulation Profile: No results for input(s): "INR", "PROTIME" in the last 168 hours. Cardiac Enzymes: No results for input(s): "CKTOTAL", "CKMB", "CKMBINDEX", "TROPONINI" in the last 168 hours. BNP (last 3 results) No results for input(s): "PROBNP" in the last 8760 hours. HbA1C: No results for input(s): "HGBA1C" in the last 72 hours. CBG: Recent Labs  Lab 01/11/24 1518 01/11/24 2030 01/12/24 0019 01/12/24 0422 01/12/24 0748  GLUCAP 127* 124* 143* 145* 110*   Lipid Profile: No results for input(s): "CHOL", "HDL", "LDLCALC", "TRIG", "CHOLHDL", "LDLDIRECT" in the last 72 hours. Thyroid Function Tests: No results for input(s): "TSH", "T4TOTAL", "FREET4", "T3FREE", "THYROIDAB" in the last 72 hours. Anemia Panel: No results for input(s): "VITAMINB12", "FOLATE", "FERRITIN", "TIBC", "IRON", "RETICCTPCT" in the last 72 hours. Sepsis Labs: No results for input(s): "PROCALCITON", "LATICACIDVEN" in the last 168 hours.   Recent Results (from the past 240 hours)  Culture, Respiratory w Gram Stain     Status: None   Collection Time: 01/02/24  5:22 PM   Specimen: Tracheal Aspirate; Respiratory  Result Value Ref Range Status   Specimen Description TRACHEAL ASPIRATE  Final   Special Requests NONE  Final   Gram Stain   Final    RARE WBC PRESENT, PREDOMINANTLY PMN FEW GRAM POSITIVE COCCI RARE GRAM NEGATIVE RODS Performed at [facility], [address] [place], [name] [address]    Culture FEW METHICILLIN RESISTANT STAPHYLOCOCCUS AUREUS  Final   Report Status 01/04/2024 FINAL  Final   Organism ID, Bacteria METHICILLIN RESISTANT STAPHYLOCOCCUS AUREUS  Final      Susceptibility   Methicillin resistant staphylococcus aureus - MIC*    CIPROFLOXACIN <=0.5 SENSITIVE Sensitive     ERYTHROMYCIN  >=8  RESISTANT Resistant     GENTAMICIN <=0.5 SENSITIVE Sensitive     OXACILLIN >=4 RESISTANT Resistant     TETRACYCLINE >=16 RESISTANT Resistant     VANCOMYCIN  1 SENSITIVE Sensitive     TRIMETH/SULFA <=10 SENSITIVE Sensitive     CLINDAMYCIN <=0.25 SENSITIVE Sensitive     RIFAMPIN <=0.5 SENSITIVE Sensitive     Inducible Clindamycin NEGATIVE Sensitive     LINEZOLID  2 SENSITIVE Sensitive     * FEW METHICILLIN RESISTANT STAPHYLOCOCCUS AUREUS         Radiology Studies: No results found.      Scheduled Meds:  amLODipine   10 mg Per Tube Daily   artificial tears   Left Eye Q8H   Chlorhexidine  Gluconate Cloth  6 each Topical Daily   enoxaparin  (LOVENOX ) injection  50 mg Subcutaneous Daily   famotidine   20 mg Per Tube BID   feeding supplement (PROSource TF20)  60 mL Per Tube TID   free water   150 mL Per Tube Q4H   insulin  aspart  0-15 Units Subcutaneous Q4H   metoprolol  tartrate  100 mg Per Tube BID   multivitamin with minerals  1 tablet Per Tube Daily   nutrition supplement (JUVEN)  1 packet Per Tube BID BM   mouth rinse  15 mL Mouth Rinse 4 times per day   Continuous Infusions:  feeding supplement (OSMOLITE 1.5 CAL) 1,000 mL (01/10/24 0414)     LOS: 77 days    Time spent: 45 minutes spent on 01/12/2024 caring for this patient face-to-face including chart review, ordering labs/tests, documenting, discussion with nursing staff, consultants, updating family and interview/physical exam    Rema Care [place], DO [facility] Available via Epic secure chat 7am-7pm After these hours, please refer to coverage provider listed on [email or web address] 01/12/2024, 10:02 AM

## 2024-01-12 NOTE — Plan of Care (Signed)
  Problem: Nutrition: Goal: Dietary intake will improve Outcome: Progressing   Problem: Fluid Volume: Goal: Ability to maintain a balanced intake and output will improve Outcome: Progressing   Problem: Education: Goal: Knowledge of disease or condition will improve Outcome: Not Progressing   Problem: Health Behavior/Discharge Planning: Goal: Ability to manage health-related needs will improve Outcome: Not Progressing   Problem: Self-Care: Goal: Ability to communicate needs accurately will improve Outcome: Not Progressing   Problem: Nutrition: Goal: Risk of aspiration will decrease Outcome: Not Progressing   Problem: Nutritional: Goal: Progress toward achieving an optimal weight will improve Outcome: Not Progressing   Problem: Health Behavior/Discharge Planning: Goal: Ability to manage health-related needs will improve Outcome: Not Progressing   Problem: Clinical Measurements: Goal: Respiratory complications will improve Outcome: Not Progressing   Problem: Activity: Goal: Risk for activity intolerance will decrease Outcome: Not Progressing

## 2024-01-13 DIAGNOSIS — I613 Nontraumatic intracerebral hemorrhage in brain stem: Secondary | ICD-10-CM | POA: Diagnosis not present

## 2024-01-13 LAB — GLUCOSE, CAPILLARY
Glucose-Capillary: 106 mg/dL — ABNORMAL HIGH (ref 70–99)
Glucose-Capillary: 121 mg/dL — ABNORMAL HIGH (ref 70–99)
Glucose-Capillary: 122 mg/dL — ABNORMAL HIGH (ref 70–99)
Glucose-Capillary: 124 mg/dL — ABNORMAL HIGH (ref 70–99)
Glucose-Capillary: 140 mg/dL — ABNORMAL HIGH (ref 70–99)
Glucose-Capillary: 97 mg/dL (ref 70–99)
Glucose-Capillary: 97 mg/dL (ref 70–99)

## 2024-01-13 NOTE — Plan of Care (Signed)
  Problem: Coping: Goal: Will identify appropriate support needs Outcome: Progressing   Problem: Nutrition: Goal: Risk of aspiration will decrease Outcome: Progressing   Problem: Metabolic: Goal: Ability to maintain appropriate glucose levels will improve Outcome: Progressing   Problem: Skin Integrity: Goal: Risk for impaired skin integrity will decrease Outcome: Progressing   Problem: Tissue Perfusion: Goal: Adequacy of tissue perfusion will improve Outcome: Progressing   Problem: Clinical Measurements: Goal: Respiratory complications will improve Outcome: Progressing   Problem: Activity: Goal: Risk for activity intolerance will decrease Outcome: Progressing   Problem: Skin Integrity: Goal: Risk for impaired skin integrity will decrease Outcome: Progressing

## 2024-01-13 NOTE — Progress Notes (Signed)
 PROGRESS NOTE    Elmer Merwin  UJW:[id number] DOB: [birth year] DOA: 10/27/2023 PCP: Pcp, No    Brief Narrative:   Jevonte Clanton is a 48 y.o. male with no known previous medical history who was found down on 10/27/2023, unresponsive with UDS positive for opiates.  He was minimally responsive to Narcan  and he was found to be markedly hypertensive with BP 260/156.  CT head at intake notable for a 4.1 cm intracranial hemorrhage at the pons with intraventricular extension into the fourth ventricle and basal cisterns.  Initially admitted by critical care, now extubated with tracheostomy initially placed on 11/10/2023.  PEG tube placed 11/12/2023.  Disposition remains difficult given patient's needs with PEG and tracheostomy tube.  Currently awaiting disability.   Significant hospital events: 3/8: Intubated in ED, pontine bleed/SAH. CT head Acute large hemorrhage 4.1 cm in pons with intraventricular extension without hydrocephalus 3/9: CTA No change in IPH in pons, similar biparietal [facility] 3/14: Now awake, appears locked in. can follow commands with eyes. 3/16 Purposeful with left upper extremity  3/22 tracheostomy placed at bedside, bleeding issues overnight from trach site 3/24 PEG (Dr. Hildy Lowers) 3/24 sputum culture with MRSA 4/20: trach was changed to cuffless #8 5/1: Mental status appears to be somewhat improving off narcotics, more awake alert following simple commands(able to grimace, move left toes, track vertically with right eye).  Speech following. Trach downsized to [employer] cuffless #6 XLT 5/4: Modafinil  held, low threshold to resume if mental status/ability to interact diminishes 5/10: PEG dislodged, foley bulb placed and IR consulted to replace tube 5/11: IR replaced PEG tube 5/13 - 5/14: + Fever.  Restarted antibiotics.  Urine culture + E. Coli, foley catheter changed. 5/17 Tracheostomy changed.   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  UDS positive for opiates.  CT head with acute large hemorrhage, 4.1 cm in pons with) ventricular extension without hydronephrosis efflux.  Etiology likely secondary to hypertensive emergency with elevated blood pressure 260/156 at time of admission.  [name] admitted to the intensive care unit on the [facility] service as well as neurology following.  TTE with LVEF 60 to 65%, hemoglobin A1c 4.7, LDL 96.  Treated with 3% hypertonic saline.  Follow-up with CT head 3/29 with reduction in size of hemorrhage.  Patient underwent tracheostomy placement on 11/10/2023 and PEG tube placement on 11/12/2023. -- Per SLP on 5/19 patient used PMV for duration of session with stable vital signs - achieved spontaneous voicing but could not transition to purposeful voicing despite max cues    Acute respiratory failure 2/2 large pontine stroke/brainstem compression Unable to protect airway Trach initially placed 11/10/23 changed to cuffless 4/21, and exchanged for Shiley cuffless #6 XLT on 12/20/2023. 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 Multiple tracheal aspirate has been growing MRSA. Treated with vancomycin  and then later on was switched to Zyvox  due to persistent fever with recurrent aspiration. -- SLP following -- Aspiration precautions  E. coli UTI: resolved Urine culture + pansensitive E. coli. Foley catheter was changed 5/13.  Antibiotic course completed on 5/18.   Moderate to severe protein caloric malnutrition, POA Body mass index is 31.32 kg/m. Nutrition Status: Nutrition Problem: Inadequate oral intake Etiology: inability to eat Signs/Symptoms: NPO status Interventions: Tube feeding, Prostat, MVI, Juven -- S/p PEG tube placement, continues on tube feeds    PEG tube malfunction.  Resolved. PEG dislodged 5/10, replaced on 5/11 by IR   Exposure keratopathy, OS Seen by ophthalmology, Dr. Mason Sole on 4/7.  Treated with maxitrol ointment (neo-poly-dex) TID OS for 1 week. -- Refresh PM TID to QID OS for chronic lubrication -- Can tape eyelid closed if still exposure an issue (plastic tape may work better than paper and would try to tape eyelid directly rather than with gauze to keep enough tension to ensure it isn't remaining open)   AKI with acute urinary retention Currently has a Foley catheter. Prior removals/voiding trial unsuccessful -- Last catheter change was on 5/13.   Macrocytic anemia thrombocytosis  Overall stable/resolved  Perineal skin injury, POA  Continue Gerhard's Butt cream  Relative B12 and folate deficiency Continue replacement with MVI   Goals of care conversation.   Changed to DNR on 3/13.    DVT prophylaxis: SCDs Start: 10/27/23 2226    Code Status: Do not attempt resuscitation (DNR) PRE-ARREST INTERVENTIONS DESIRED Family Communication: No family present at bedside this morning  Disposition Plan:  Level of care: Telemetry Medical Status is: Inpatient Remains inpatient appropriate because: Difficult disposition per [facility]    Consultants:  PCCM Neurology Palliative Care General Surgery for PEG tube placement Ophthalmology, Dr. Mason Sole Interventional radiology for G-tube exchange 5/11  Procedures:  EEG 3/10 Percutaneous tracheostomy, Dr. Zaida Hertz 3/22 Noster bronchoscopy 3/22; Dr. Diania Fortes PEG tube placement, Dr. Hildy Lowers 3/24 PEG tube exchange, IR 5/11  Antimicrobials:   Ceftriaxone  5/13 - 5/18 Vancomycin  3/24 - 4/8, 5/14 - 5/16 Cefepime  5/14 - 5/15 Linezolid  4/9 - 4/15 Unasyn  3/23 - 3/24   Subjective: Patient seen examined bedside, lying in bed.  Sleeping, arousable, remains poorly interactive.  No family present at bedside this morning. Unable to obtain any further ROS from patient due to his current condition/mental status.  No acute issues overnight per nursing staff.  Objective: Vitals:   01/13/24 0305 01/13/24 0428 01/13/24 0821 01/13/24 0835  BP:  (!) 144/102 (!) 124/92   Pulse: 89 92 83 85  Resp: 17 18 18 17   Temp:  98.7 F (37.1 C) 98.5 F (36.9 C)   TempSrc:  Oral Oral   SpO2: 96% 100% 100% 100%  Weight:      Height:        Intake/Output Summary (Last 24 hours) at 01/13/2024 1132 Last data filed at 01/12/2024 1900 Gross per 24 hour  Intake --  Output 1050 ml  Net -1050 ml   Filed Weights   01/09/24 0453 01/10/24 0401 01/11/24 0500  Weight: 93.4 kg 92.5 kg 93.4 kg    Examination:  Physical Exam: GEN: NAD, awake HEENT: NCAT, sclera clear, left eye with patch in place, trach noted PULM: Diminished breath sounds bilateral bases, no wheezing/crackles, normal respiratory effort without accessory muscle use, on 6 L trach collar CV: RRR w/o M/G/R GI: abd soft, NTND, + BS; PEG tube noted GU: Foley catheter noted with clear yellow urine in collection bag MSK: Right hand/wrist with splint in place    Data Reviewed: I have personally reviewed following labs and imaging studies  CBC:  Recent Labs  Lab 01/08/24 0711  WBC 10.2  NEUTROABS 6.2  HGB 11.0*  HCT 34.9*  MCV 92.8  PLT 560*   Basic Metabolic Panel: Recent Labs  Lab 01/08/24 0711  NA 136  K 4.0  CL 101  CO2 26  GLUCOSE 136*  BUN 22*  CREATININE 0.59*  CALCIUM 9.0  MG 2.1   GFR: Estimated Creatinine Clearance: 126.6 mL/min (A) (by C-G formula based on SCr of 0.59 mg/dL (L)). Liver Function Tests: Recent Labs  Lab 01/08/24 0711  AST 58*  ALT 176*   ALKPHOS 60  BILITOT 0.4  PROT 7.2  ALBUMIN  2.8*   No results for input(s): "LIPASE", "AMYLASE" in the last 168 hours. No results for input(s): "AMMONIA" in the last 168 hours. Coagulation Profile: No results for input(s): "INR", "PROTIME" in the last 168 hours. Cardiac Enzymes: No results for input(s): "CKTOTAL", "CKMB", "CKMBINDEX", "TROPONINI" in the last 168 hours. BNP (last 3 results) No results for input(s): "PROBNP" in the last 8760 hours. HbA1C: No results for input(s): "HGBA1C" in the last 72 hours. CBG: Recent Labs  Lab 01/12/24 1640 01/13/24 0026 01/13/24 0426 01/13/24 0824 01/13/24 1131  GLUCAP 102* 106* 124* 122* 140*   Lipid Profile: No results for input(s): "CHOL", "HDL", "LDLCALC", "TRIG", "CHOLHDL", "LDLDIRECT" in the last 72 hours. Thyroid Function Tests: No results for input(s): "TSH", "T4TOTAL", "FREET4", "T3FREE", "THYROIDAB" in the last 72 hours. Anemia Panel: No results for input(s): "VITAMINB12", "FOLATE", "FERRITIN", "TIBC", "IRON", "RETICCTPCT" in the last 72 hours. Sepsis Labs: No results for input(s): "PROCALCITON", "LATICACIDVEN" in the last 168 hours.   No results found for this or any previous visit (from the past 240 hours).        Radiology Studies: No results found.      Scheduled Meds:  amLODipine   10 mg Per Tube Daily   artificial tears   Left Eye Q8H   Chlorhexidine  Gluconate Cloth  6 each Topical Daily   enoxaparin  (LOVENOX ) injection  50 mg Subcutaneous Daily   famotidine   20 mg Per Tube BID   feeding supplement (PROSource TF20)  60 mL Per Tube TID   free water   150 mL Per Tube Q4H   insulin  aspart  0-15 Units Subcutaneous Q4H   metoprolol  tartrate  100 mg Per Tube BID   multivitamin with minerals  1 tablet Per Tube Daily   nutrition supplement (JUVEN)  1 packet Per Tube BID BM   mouth rinse  15 mL Mouth Rinse 4 times per day   Continuous Infusions:  feeding supplement (OSMOLITE 1.5 CAL) 1,000 mL (01/13/24 0011)      LOS: 78 days    Time spent: 45 minutes spent on 01/13/2024 caring for this patient face-to-face including chart review, ordering labs/tests, documenting, discussion with nursing staff, consultants, updating family and interview/physical exam    Rema Care [place], DO [facility] Available via Epic secure chat 7am-7pm After these hours, please refer to coverage provider listed on [email or web address] 01/13/2024, 11:32 AM

## 2024-01-13 NOTE — Plan of Care (Signed)
 progressing

## 2024-01-14 DIAGNOSIS — I613 Nontraumatic intracerebral hemorrhage in brain stem: Secondary | ICD-10-CM | POA: Diagnosis not present

## 2024-01-14 LAB — COMPREHENSIVE METABOLIC PANEL WITH GFR
ALT: 259 U/L — ABNORMAL HIGH (ref 0–44)
AST: 68 U/L — ABNORMAL HIGH (ref 15–41)
Albumin: 2.9 g/dL — ABNORMAL LOW (ref 3.5–5.0)
Alkaline Phosphatase: 60 U/L (ref 38–126)
Anion gap: 9 (ref 5–15)
BUN: 24 mg/dL — ABNORMAL HIGH (ref 6–20)
CO2: 29 mmol/L (ref 22–32)
Calcium: 9.3 mg/dL (ref 8.9–10.3)
Chloride: 99 mmol/L (ref 98–111)
Creatinine, Ser: 0.61 mg/dL (ref 0.61–1.24)
GFR, Estimated: >60 mL/min (ref >60)
Glucose, Bld: 147 mg/dL — ABNORMAL HIGH (ref 70–99)
Potassium: 3.6 mmol/L (ref 3.5–5.1)
Sodium: 137 mmol/L (ref 135–145)
Total Bilirubin: 0.5 mg/dL (ref 0.0–1.2)
Total Protein: 7.1 g/dL (ref 6.5–8.1)

## 2024-01-14 LAB — GLUCOSE, CAPILLARY
Glucose-Capillary: 122 mg/dL — ABNORMAL HIGH (ref 70–99)
Glucose-Capillary: 138 mg/dL — ABNORMAL HIGH (ref 70–99)
Glucose-Capillary: 138 mg/dL — ABNORMAL HIGH (ref 70–99)
Glucose-Capillary: 131 mg/dL — ABNORMAL HIGH (ref 70–99)
Glucose-Capillary: 137 mg/dL — ABNORMAL HIGH (ref 70–99)

## 2024-01-14 LAB — CBC
HCT: 37.5 % — ABNORMAL LOW (ref 39.0–52.0)
Hemoglobin: 11.8 g/dL — ABNORMAL LOW (ref 13.0–17.0)
MCH: 29.2 pg (ref 26.0–34.0)
MCHC: 31.5 g/dL (ref 30.0–36.0)
MCV: 92.8 fL (ref 80.0–100.0)
Platelets: 519 10*3/uL — ABNORMAL HIGH (ref 150–400)
RBC: 4.04 MIL/uL — ABNORMAL LOW (ref 4.22–5.81)
RDW: 14.2 % (ref 11.5–15.5)
WBC: 8.5 10*3/uL (ref 4.0–10.5)
nRBC: 0 % (ref 0.0–0.2)

## 2024-01-14 LAB — MAGNESIUM: Magnesium: 2 mg/dL (ref 1.7–2.4)

## 2024-01-14 LAB — PHOSPHORUS: Phosphorus: 4.2 mg/dL (ref 2.5–4.6)

## 2024-01-14 NOTE — Progress Notes (Signed)
 PROGRESS NOTE    Garrett [name]  UJW:[id number] DOB: 12-02-1975 DOA: 10/27/2023 PCP: Pcp, No    Brief Narrative:   Garrett [name] is a 48 y.o. male with no known previous medical history who was found down on 10/27/2023, unresponsive with UDS positive for opiates.  He was minimally responsive to Narcan  and he was found to be markedly hypertensive with BP 260/156.  CT head at intake notable for a 4.1 cm intracranial hemorrhage at the pons with intraventricular extension into the fourth ventricle and basal cisterns.  Initially admitted by critical care, now extubated with tracheostomy initially placed on 11/10/2023.  PEG tube placed 11/12/2023.  Disposition remains difficult given patient's needs with PEG and tracheostomy tube.  Currently awaiting disability.   Significant hospital events: 3/8: Intubated in ED, pontine bleed/SAH. CT head Acute large hemorrhage 4.1 cm in pons with intraventricular extension without hydrocephalus 3/9: CTA No change in IPH in pons, similar biparietal [facility] 3/14: Now awake, appears locked in. can follow commands with eyes. 3/16 Purposeful with left upper extremity  3/22 tracheostomy placed at bedside, bleeding issues overnight from trach site 3/24 PEG (Dr. Hildy Lowers) 3/24 sputum culture with MRSA 4/20: trach was changed to cuffless #8 5/1: Mental status appears to be somewhat improving off narcotics, more awake alert following simple commands(able to grimace, move left toes, track vertically with right eye).  Speech following. Trach downsized to [employer] cuffless #6 XLT 5/4: Modafinil  held, low threshold to resume if mental status/ability to interact diminishes 5/10: PEG dislodged, foley bulb placed and IR consulted to replace tube 5/11: IR replaced PEG tube 5/13 - 5/14: + Fever.  Restarted antibiotics.  Urine culture + E. Coli, foley catheter changed. 5/17 Tracheostomy changed.   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  UDS positive for opiates.  CT head with acute large hemorrhage, 4.1 cm in pons with) ventricular extension without hydronephrosis efflux.  Etiology likely secondary to hypertensive emergency with elevated blood pressure 260/156 at time of admission.  Garrett [name] admitted to the intensive care unit on the [facility] service as well as neurology following.  TTE with LVEF 60 to 65%, hemoglobin A1c 4.7, LDL 96.  Treated with 3% hypertonic saline.  Follow-up with CT head 3/29 with reduction in size of hemorrhage.  Patient underwent tracheostomy placement on 11/10/2023 and PEG tube placement on 11/12/2023. -- Per SLP on 5/19 patient used PMV for duration of session with stable vital signs - achieved spontaneous voicing but could not transition to purposeful voicing despite max cues    Acute respiratory failure 2/2 large pontine stroke/brainstem compression Unable to protect airway Trach initially placed 11/10/23 changed to cuffless 4/21, and exchanged for Shiley cuffless #6 XLT on 12/20/2023. 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 Multiple tracheal aspirate has been growing MRSA. Treated with vancomycin  and then later on was switched to Zyvox  due to persistent fever with recurrent aspiration. -- SLP following -- Aspiration precautions  E. coli UTI: resolved Urine culture + pansensitive E. coli. Foley catheter was changed 5/13.  Antibiotic course completed on 5/18.   Moderate to severe protein caloric malnutrition, POA Body mass index is 31.32 kg/m. Nutrition Status: Nutrition Problem: Inadequate oral intake Etiology: inability to eat Signs/Symptoms: NPO status Interventions: Tube feeding, Prostat, MVI, Juven -- S/p PEG tube placement, continues on tube feeds    PEG tube malfunction.  Resolved. PEG dislodged 5/10, replaced on 5/11 by IR   Exposure keratopathy, OS Seen by ophthalmology, Dr. Mason Sole on 4/7.  Treated with maxitrol ointment (neo-poly-dex) TID OS for 1 week. -- Refresh PM TID to QID OS for chronic lubrication -- Can tape eyelid closed if still exposure an issue (plastic tape may work better than paper and would try to tape eyelid directly rather than with gauze to keep enough tension to ensure it isn't remaining open)   AKI with acute urinary retention Currently has a Foley catheter. Prior removals/voiding trial unsuccessful -- Last catheter change was on 5/13.   Macrocytic anemia thrombocytosis  Overall stable/resolved  Perineal skin injury, POA  Continue Garrett [name]'s Butt cream  Relative B12 and folate deficiency Continue replacement with MVI   Goals of care conversation.   Changed to DNR on 3/13.    DVT prophylaxis: SCDs Start: 10/27/23 2226    Code Status: Do not attempt resuscitation (DNR) PRE-ARREST INTERVENTIONS DESIRED Family Communication: No family present at bedside this morning updated mother via telephone this afternoon.  Disposition Plan:  Level of care: Telemetry Medical Status is: Inpatient Remains inpatient appropriate because: Difficult disposition per [facility]    Consultants:  PCCM Neurology Palliative Care General Surgery for PEG tube placement Ophthalmology, Dr. Mason Sole Interventional radiology for G-tube exchange 5/11  Procedures:  EEG 3/10 Percutaneous tracheostomy, Dr. Zaida Hertz 3/22 Noster bronchoscopy 3/22; Dr. Diania Fortes PEG tube placement, Dr. Hildy Lowers 3/24 PEG  tube exchange, IR 5/11  Antimicrobials:  Ceftriaxone  5/13 - 5/18 Vancomycin  3/24 - 4/8, 5/14 - 5/16 Cefepime  5/14 - 5/15 Linezolid  4/9 - 4/15 Unasyn  3/23 - 3/24   Subjective: Patient seen examined bedside, lying in bed.  Sleeping, arousable, remains poorly interactive.  No family present at bedside this morning. Unable to obtain any further ROS from patient due to his current condition/mental status.  No acute issues overnight per nursing staff.  Objective: Vitals:   01/14/24 0725 01/14/24 0735 01/14/24 1013 01/14/24 1141  BP: (!) 128/96   (!) 129/93  Pulse: 82 88 85 75  Resp: 19 20 18 18   Temp: 98.3 F (36.8 C)   97.9 F (36.6 C)  TempSrc: Oral   Oral  SpO2: 100% 100% 99% 100%  Weight:      Height:        Intake/Output Summary (Last 24 hours) at 01/14/2024 1306 Last data filed at 01/14/2024 1610 Gross per 24 hour  Intake --  Output 3550 ml  Net -3550 ml   Filed Weights   01/09/24 0453 01/10/24 0401 01/11/24 0500  Weight: 93.4 kg 92.5 kg 93.4 kg    Examination:  Physical Exam: GEN: NAD, awake HEENT: NCAT, sclera clear, left eye with patch in place, trach noted PULM: Diminished breath sounds bilateral bases, no wheezing/crackles, normal respiratory effort without accessory muscle use, on 6 L trach collar CV: RRR w/o M/G/R GI: abd soft, NTND, + BS; PEG tube noted GU: Foley catheter noted with clear yellow urine in collection bag MSK: Right hand/wrist with splint in place    Data Reviewed: I have personally reviewed following  labs and imaging studies  CBC: Recent Labs  Lab 01/08/24 0711 01/14/24 0612  WBC 10.2 8.5  NEUTROABS 6.2  --   HGB 11.0* 11.8*  HCT 34.9* 37.5*  MCV 92.8 92.8  PLT 560* 519*   Basic Metabolic Panel: Recent Labs  Lab 01/08/24 0711 01/14/24 0612  NA 136 137  K 4.0 3.6  CL 101 99  CO2 26 29  GLUCOSE 136* 147*  BUN 22* 24*  CREATININE 0.59* 0.61  CALCIUM 9.0 9.3  MG 2.1 2.0  PHOS  --  4.2   GFR: Estimated Creatinine  Clearance: 126.6 mL/min (by C-G formula based on SCr of 0.61 mg/dL). Liver Function Tests: Recent Labs  Lab 01/08/24 0711 01/14/24 0612  AST 58* 68*  ALT 176* 259*  ALKPHOS 60 60  BILITOT 0.4 0.5  PROT 7.2 7.1  ALBUMIN  2.8* 2.9*   No results for input(s): "LIPASE", "AMYLASE" in the last 168 hours. No results for input(s): "AMMONIA" in the last 168 hours. Coagulation Profile: No results for input(s): "INR", "PROTIME" in the last 168 hours. Cardiac Enzymes: No results for input(s): "CKTOTAL", "CKMB", "CKMBINDEX", "TROPONINI" in the last 168 hours. BNP (last 3 results) No results for input(s): "PROBNP" in the last 8760 hours. HbA1C: No results for input(s): "HGBA1C" in the last 72 hours. CBG: Recent Labs  Lab 01/13/24 2005 01/13/24 2327 01/14/24 0450 01/14/24 0724 01/14/24 1141  GLUCAP 97 97 122* 138* 138*   Lipid Profile: No results for input(s): "CHOL", "HDL", "LDLCALC", "TRIG", "CHOLHDL", "LDLDIRECT" in the last 72 hours. Thyroid Function Tests: No results for input(s): "TSH", "T4TOTAL", "FREET4", "T3FREE", "THYROIDAB" in the last 72 hours. Anemia Panel: No results for input(s): "VITAMINB12", "FOLATE", "FERRITIN", "TIBC", "IRON", "RETICCTPCT" in the last 72 hours. Sepsis Labs: No results for input(s): "PROCALCITON", "LATICACIDVEN" in the last 168 hours.   No results found for this or any previous visit (from the past 240 hours).        Radiology Studies: No results found.      Scheduled Meds:  amLODipine   10 mg Per Tube Daily   artificial tears   Left Eye Q8H   Chlorhexidine  Gluconate Cloth  6 each Topical Daily   enoxaparin  (LOVENOX ) injection  50 mg Subcutaneous Daily   famotidine   20 mg Per Tube BID   feeding supplement (PROSource TF20)  60 mL Per Tube TID   free water   150 mL Per Tube Q4H   insulin  aspart  0-15 Units Subcutaneous Q4H   metoprolol  tartrate  100 mg Per Tube BID   multivitamin with minerals  1 tablet Per Tube Daily   nutrition  supplement (JUVEN)  1 packet Per Tube BID BM   mouth rinse  15 mL Mouth Rinse 4 times per day   Continuous Infusions:  feeding supplement (OSMOLITE 1.5 CAL) 1,000 mL (01/13/24 0011)     LOS: 79 days    Time spent: 45 minutes spent on 01/14/2024 caring for this patient face-to-face including chart review, ordering labs/tests, documenting, discussion with nursing staff, consultants, updating family and interview/physical exam    Rema Care [place], DO [facility] Available via Epic secure chat 7am-7pm After these hours, please refer to coverage provider listed on [email or web address] 01/14/2024, 1:06 PM

## 2024-01-14 NOTE — Plan of Care (Signed)
  Problem: Education: Goal: Knowledge of disease or condition will improve Outcome: Progressing Goal: Knowledge of secondary prevention will improve (MUST DOCUMENT ALL) Outcome: Progressing Goal: Knowledge of patient specific risk factors will improve (DELETE if not current risk factor) Outcome: Progressing   Problem: Intracerebral Hemorrhage Tissue Perfusion: Goal: Complications of Intracerebral Hemorrhage will be minimized Outcome: Progressing   Problem: Coping: Goal: Will verbalize positive feelings about self Outcome: Progressing Goal: Will identify appropriate support needs Outcome: Progressing   Problem: Health Behavior/Discharge Planning: Goal: Ability to manage health-related needs will improve Outcome: Progressing Goal: Goals will be collaboratively established with patient/family Outcome: Progressing

## 2024-01-14 NOTE — Progress Notes (Signed)
 Physical Therapy Treatment Patient Details Name: Garrett [name] MRN: [id number] DOB: 05-23-1976 Today's Date: 01/14/2024   History of Present Illness Pt is a 48 yo male who was found down 10/27/23 with agonal breathing. 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 Intubated 3/8, cortrak placed 3/14, trach placed 3/22, PEG placed 3/24. PMH: HTN, smoker    PT Comments  Patient assisted OOB to chair via lift today.  Was able to communicate using board for "yes"/"no" with questions about pain, if he needed anything and to ensure understanding of plan.  Patient on 28% trach collar with VSS throughout.  Continue to feel he will needs inpatient rehab (<3 hours/day) at d/c.     If plan is discharge home, recommend the following: Two people to help with walking and/or transfers;Two people to help with bathing/dressing/bathroom;Assistance with cooking/housework;Assistance with feeding;Direct supervision/assist for medications management;Direct supervision/assist for financial management;Assist for transportation;Help with stairs or ramp for entrance;Supervision due to cognitive status   Can travel by private vehicle        Equipment Recommendations  [place] lift;Hospital bed;Wheelchair (measurements PT);Wheelchair cushion (measurements PT);BSC/3in1;Other (comment)    Recommendations for Other Services       Precautions / Restrictions Precautions Precautions: Fall Recall of Precautions/Restrictions: Impaired Precaution/Restrictions Comments: trach, PEG, abdominal binder, SBP < 160 Required Braces or Orthoses: Other Brace Splint/Cast: Bil resting hand splints Other Brace: has bilateral prevalon boots     Mobility  Bed Mobility Overal bed mobility: Needs Assistance Bed Mobility: Rolling Rolling: +2 for physical assistance, +2 for  safety/equipment, Total assist         General bed mobility comments: hoyer pad placed    Transfers Overall transfer level: Needs assistance Equipment used: Ambulation equipment used Transfers: Bed to chair/wheelchair/BSC             General transfer comment: lifted to chair with maximove and positioned for stability and upright head position with partial recline. Transfer via Lift Equipment: Maximove  Ambulation/Gait                   Stairs             Wheelchair Mobility     Tilt Bed    Modified Rankin (Stroke Patients Only) Modified Rankin (Stroke Patients Only) Pre-Morbid Rankin Score: No symptoms Modified Rankin: Severe disability     Balance Overall balance assessment: Needs assistance Sitting-balance support: Feet supported Sitting balance-Leahy Scale: Zero Sitting balance - Comments: in chair, +2 to lean forward to scoot hips back for positioning                                    Communication Communication Communication: Impaired Factors Affecting Communication: Trach/intubated  Cognition Arousal: Alert Behavior During Therapy: Flat affect   PT - Cognitive impairments: Difficult to assess Difficult to assess due to: Tracheostomy, Impaired communication                     PT - Cognition Comments: using communication board ("yes"/"no") consistently with eyes. also has vertical nystagmus Following commands: Impaired      Cueing Cueing Techniques: Verbal cues  Exercises Other Exercises Other Exercises: PROM Bilat LEs with hamstring, heel cord and hip rotator stretches Other Exercises: AAROM.PROM bilateral UE's in supine  General Comments General comments (skin integrity, edema, etc.): 28% trach collar; no binder in the room so obtained new one and placed with lift pad prior to OOB      Pertinent Vitals/Pain Pain Assessment Pain Assessment: No/denies pain    Home Living                           Prior Function            PT Goals (current goals can now be found in the care plan section) Acute Rehab PT Goals Patient Stated Goal: unable to state PT Goal Formulation: Patient unable to participate in goal setting Time For Goal Achievement: 01/28/24 Potential to Achieve Goals: Fair Additional Goals Additional Goal #2: Patient able to initiate head movements for positioning in bed and when upright in chair at least 50% of the time. Progress towards PT goals: Progressing toward goals    Frequency    Min 2X/week      PT Plan      Co-evaluation              AM-PAC PT "6 Clicks" Mobility   Outcome Measure  Help needed turning from your back to your side while in a flat bed without using bedrails?: Total Help needed moving from lying on your back to sitting on the side of a flat bed without using bedrails?: Total Help needed moving to and from a bed to a chair (including a wheelchair)?: Total Help needed standing up from a chair using your arms (e.g., wheelchair or bedside chair)?: Total Help needed to walk in hospital room?: Total Help needed climbing 3-5 steps with a railing? : Total 6 Click Score: 6    End of Session Equipment Utilized During Treatment: Oxygen Activity Tolerance: Patient tolerated treatment well Patient left: in chair;with call bell/phone within reach;with chair alarm set Nurse Communication: Mobility status;Need for lift equipment PT Visit Diagnosis: Muscle weakness (generalized) (M62.81);Other symptoms and signs involving the nervous system (R29.898);Hemiplegia and hemiparesis Hemiplegia - Right/Left: Right Hemiplegia - dominant/non-dominant: Dominant Hemiplegia - caused by: Nontraumatic intracerebral hemorrhage     Time: 1215-1246 PT Time Calculation (min) (ACUTE ONLY): 31 min  Charges:    $Therapeutic Activity: 23-37 mins PT General Charges $$ ACUTE PT VISIT: 1 Visit                     Abigail Hoff, PT Acute  Rehabilitation Services Office:[phone] 01/14/2024    Garrett [name] 01/14/2024, 4:42 PM

## 2024-01-14 NOTE — Plan of Care (Signed)
  Problem: Intracerebral Hemorrhage Tissue Perfusion: Goal: Complications of Intracerebral Hemorrhage will be minimized Outcome: Progressing   Problem: Nutrition: Goal: Risk of aspiration will decrease Outcome: Progressing Goal: Dietary intake will improve Outcome: Progressing   Problem: Coping: Goal: Ability to adjust to condition or change in health will improve Outcome: Progressing   Problem: Fluid Volume: Goal: Ability to maintain a balanced intake and output will improve Outcome: Progressing   Problem: Metabolic: Goal: Ability to maintain appropriate glucose levels will improve Outcome: Progressing   Problem: Nutritional: Goal: Maintenance of adequate nutrition will improve Outcome: Progressing   Problem: Skin Integrity: Goal: Risk for impaired skin integrity will decrease Outcome: Progressing   Problem: Tissue Perfusion: Goal: Adequacy of tissue perfusion will improve Outcome: Progressing

## 2024-01-15 DIAGNOSIS — I613 Nontraumatic intracerebral hemorrhage in brain stem: Secondary | ICD-10-CM | POA: Diagnosis not present

## 2024-01-15 LAB — GLUCOSE, CAPILLARY
Glucose-Capillary: 100 mg/dL — ABNORMAL HIGH (ref 70–99)
Glucose-Capillary: 118 mg/dL — ABNORMAL HIGH (ref 70–99)
Glucose-Capillary: 136 mg/dL — ABNORMAL HIGH (ref 70–99)
Glucose-Capillary: 138 mg/dL — ABNORMAL HIGH (ref 70–99)
Glucose-Capillary: 142 mg/dL — ABNORMAL HIGH (ref 70–99)
Glucose-Capillary: 167 mg/dL — ABNORMAL HIGH (ref 70–99)

## 2024-01-15 NOTE — Plan of Care (Signed)
  Problem: Coping: Goal: Will verbalize positive feelings about self Outcome: Progressing Goal: Will identify appropriate support needs 01/15/2024 1858 by Daltin Crist, RN Outcome: Progressing 01/15/2024 1856 by Graeden Bitner, RN Outcome: Progressing   Problem: Health Behavior/Discharge Planning: Goal: Goals will be collaboratively established with patient/family 01/15/2024 1858 by Marlyss Cissell, RN Outcome: Progressing 01/15/2024 1856 by Biran Mayberry, RN Outcome: Progressing   Problem: Self-Care: Goal: Ability to participate in self-care as condition permits will improve Outcome: Progressing   Problem: Nutrition: Goal: Risk of aspiration will decrease 01/15/2024 1858 by Lakely Elmendorf, RN Outcome: Progressing 01/15/2024 1856 by Newell Frater, RN Outcome: Progressing   Problem: Fluid Volume: Goal: Ability to maintain a balanced intake and output will improve Outcome: Progressing   Problem: Health Behavior/Discharge Planning: Goal: Ability to manage health-related needs will improve Outcome: Progressing   Problem: Metabolic: Goal: Ability to maintain appropriate glucose levels will improve Outcome: Progressing   Problem: Nutritional: Goal: Maintenance of adequate nutrition will improve 01/15/2024 1858 by Kyoko Elsea, RN Outcome: Progressing 01/15/2024 1856 by Jaxden Blyden, RN Outcome: Progressing   Problem: Skin Integrity: Goal: Risk for impaired skin integrity will decrease 01/15/2024 1858 by Manreet Kiernan, RN Outcome: Progressing 01/15/2024 1856 by Ellamae Lybeck, RN Outcome: Progressing   Problem: Tissue Perfusion: Goal: Adequacy of tissue perfusion will improve Outcome: Progressing

## 2024-01-15 NOTE — TOC Progression Note (Signed)
 Transition of Care [facility]) - Progression Note    Patient Details  Name: Garrett [name] MRN: [id number] Date of Birth: [birth year]  Transition of Care [facility]) CM/SW Contact  Tandy Fam, [name] Phone Number: 01/15/2024, 10:39 AM  Clinical Narrative:   CSW following for placement. Disability remains pending, no LOG beds available at this time. CSW to follow.    Expected Discharge Plan: Long Term Nursing Home Barriers to Discharge: Continued Medical Work up, SNF Pending Medicaid, SNF Pending bed offer, SNF Pending payor source - LOG, Inadequate or no insurance (New trach)  Expected Discharge Plan and Services In-house Referral: Clinical Social Work, Hospice / Palliative Care   Post Acute Care Choice: Skilled Nursing Facility Living arrangements for the past 2 months: Single Family Home                                       Social Determinants of Health (SDOH) Interventions SDOH Screenings   Food Insecurity: Patient Unable To Answer (10/29/2023)  Social Connections: Unknown (06/23/2023)   Received from [facility]  Tobacco Use: High Risk (10/31/2023)    Readmission Risk Interventions     No data to display

## 2024-01-15 NOTE — Progress Notes (Signed)
 PROGRESS NOTE    [name]  ZOX:[id number] DOB: [birth year] DOA: 10/27/2023 PCP: Pcp, No    Brief Narrative:   Garrett [name] is a 48 y.o. male with no known previous medical history who was found down on 10/27/2023, unresponsive with UDS positive for opiates.  He was minimally responsive to Narcan  and he was found to be markedly hypertensive with BP 260/156.  CT head at intake notable for a 4.1 cm intracranial hemorrhage at the pons with intraventricular extension into the fourth ventricle and basal cisterns.  Initially admitted by critical care, now extubated with tracheostomy initially placed on 11/10/2023.  PEG tube placed 11/12/2023.  Disposition remains difficult given patient's needs with PEG and tracheostomy tube.  Currently awaiting disability.   Significant hospital events: 3/8: Intubated in ED, pontine bleed/SAH. CT head Acute large hemorrhage 4.1 cm in pons with intraventricular extension without hydrocephalus 3/9: CTA No change in IPH in pons, similar biparietal [facility] 3/14: Now awake, appears locked in. can follow commands with eyes. 3/16 Purposeful with left upper extremity  3/22 tracheostomy placed at bedside, bleeding issues overnight from trach site 3/24 PEG (Dr. Hildy Lowers) 3/24 sputum culture with MRSA 4/20: trach was changed to cuffless #8 5/1: Mental status appears to be somewhat improving off narcotics, more awake alert following simple commands(able to grimace, move left toes, track vertically with right eye).  Speech following. Trach downsized to [employer] cuffless #6 XLT 5/4: Modafinil  held, low threshold to resume if mental status/ability to interact diminishes 5/10: PEG dislodged, foley bulb placed and IR consulted to replace tube 5/11: IR replaced PEG tube 5/13 - 5/14: + Fever.  Restarted antibiotics.  Urine culture + E. Coli, foley catheter changed. 5/17 Tracheostomy changed.   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  UDS positive for opiates.  CT head with acute large hemorrhage, 4.1 cm in pons with) ventricular extension without hydronephrosis efflux.  Etiology likely secondary to hypertensive emergency with elevated blood pressure 260/156 at time of admission.  [name] admitted to the intensive care unit on the [facility] service as well as neurology following.  TTE with LVEF 60 to 65%, hemoglobin A1c 4.7, LDL 96.  Treated with 3% hypertonic saline.  Follow-up with CT head 3/29 with reduction in size of hemorrhage.  Patient underwent tracheostomy placement on 11/10/2023 and PEG tube placement on 11/12/2023. -- Per SLP on 5/19 patient used PMV for duration of session with stable vital signs - achieved spontaneous voicing but could not transition to purposeful voicing despite max cues    Acute respiratory failure 2/2 large pontine stroke/brainstem compression Unable to protect airway Trach initially placed 11/10/23 changed to cuffless 4/21, and exchanged for Shiley cuffless #6 XLT on 12/20/2023. 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 Multiple tracheal aspirate has been growing MRSA. Treated with vancomycin  and then later on was switched to Zyvox  due to persistent fever  with recurrent aspiration. -- SLP following -- Aspiration precautions   E. coli  UTI: resolved Urine culture + pansensitive E. coli. Foley catheter was changed 5/13. Antibiotic course completed on 5/18.   Moderate to severe protein caloric malnutrition, POA Body mass index is 31.27 kg/m. Nutrition Status: Nutrition Problem: Inadequate oral intake Etiology: inability to eat Signs/Symptoms: NPO status Interventions: Tube feeding, Prostat, MVI, Juven -- S/p PEG tube placement, continues on tube feeds    PEG tube malfunction.  Resolved. PEG dislodged 5/10, replaced on 5/11 by IR   Exposure keratopathy, OS Seen by ophthalmology, Dr. Mason Sole on 4/7.  Treated with maxitrol ointment (neo-poly-dex) TID OS for 1 week. -- Refresh PM TID to QID OS for chronic lubrication -- Can tape eyelid closed if still exposure an issue (plastic tape may work better than paper and would try to tape eyelid directly rather than with gauze to keep enough tension to ensure it isn't remaining open)   AKI with acute urinary retention Currently has a Foley catheter. Prior removals/voiding trial unsuccessful -- Last catheter change was on 5/13.   Macrocytic anemia thrombocytosis  Overall stable/resolved  Perineal skin injury, POA  Continue Gerhard's Butt cream  Relative B12 and folate deficiency Continue replacement with MVI   Goals of care conversation.   Changed to DNR on 3/13.    DVT prophylaxis: SCDs Start: 10/27/23 2226    Code Status: Do not attempt resuscitation (DNR) PRE-ARREST INTERVENTIONS DESIRED Family Communication: No family present at bedside this morning updated mother via telephone extensively on 5/26  Disposition Plan:  Level of care: Telemetry Medical Status is: Inpatient Remains inpatient appropriate because: Difficult disposition per [facility]    Consultants:  PCCM Neurology Palliative Care General Surgery for PEG tube placement Ophthalmology, Dr. Mason Sole Interventional radiology for G-tube exchange 5/11  Procedures:  EEG 3/10 Percutaneous tracheostomy, Dr.  Zaida Hertz 3/22 Noster bronchoscopy 3/22; Dr. Diania Fortes PEG tube placement, Dr. Hildy Lowers 3/24 PEG tube exchange, IR 5/11  Antimicrobials:  Ceftriaxone  5/13 - 5/18 Vancomycin  3/24 - 4/8, 5/14 - 5/16 Cefepime  5/14 - 5/15 Linezolid  4/9 - 4/15 Unasyn  3/23 - 3/24   Subjective: Patient seen examined bedside, lying in bed.  Sleeping, arousable, remains poorly interactive.  No family present at bedside this morning. Unable to obtain any further ROS from patient due to his current condition/mental status.  No acute issues overnight per nursing staff.  Objective: Vitals:   01/15/24 0500 01/15/24 0727 01/15/24 0806 01/15/24 1030  BP:   119/77   Pulse:  74 75 78  Resp:  20 18 18   Temp:   98 F (36.7 C)   TempSrc:   Oral   SpO2:  100% 100% 100%  Weight: 93.3 kg     Height:        Intake/Output Summary (Last 24 hours) at 01/15/2024 1050 Last data filed at 01/15/2024 1012 Gross per 24 hour  Intake 300 ml  Output 1500 ml  Net -1200 ml   Filed Weights   01/10/24 0401 01/11/24 0500 01/15/24 0500  Weight: 92.5 kg 93.4 kg 93.3 kg    Examination:  Physical Exam: GEN: NAD, awake HEENT: NCAT, sclera clear, left eye with patch in place, trach noted PULM: Diminished breath sounds bilateral bases, no wheezing/crackles, normal respiratory effort without accessory muscle use, on 6 L trach collar CV: RRR w/o M/G/R GI: abd soft, NTND, + BS; PEG tube noted GU: Foley catheter noted with clear yellow urine in collection bag MSK: Right hand/wrist with splint in place  Data Reviewed: I have personally reviewed following labs and imaging studies  CBC: Recent Labs  Lab 01/14/24 0612  WBC 8.5  HGB 11.8*  HCT 37.5*  MCV 92.8  PLT 519*   Basic Metabolic Panel: Recent Labs  Lab 01/14/24 0612  NA 137  K 3.6  CL 99  CO2 29  GLUCOSE 147*  BUN 24*  CREATININE 0.61  CALCIUM 9.3  MG 2.0  PHOS 4.2   GFR: Estimated Creatinine Clearance: 126.6 mL/min (by C-G formula based on SCr of 0.61  mg/dL). Liver Function Tests: Recent Labs  Lab 01/14/24 0612  AST 68*  ALT 259*  ALKPHOS 60  BILITOT 0.5  PROT 7.1  ALBUMIN  2.9*   No results for input(s): "LIPASE", "AMYLASE" in the last 168 hours. No results for input(s): "AMMONIA" in the last 168 hours. Coagulation Profile: No results for input(s): "INR", "PROTIME" in the last 168 hours. Cardiac Enzymes: No results for input(s): "CKTOTAL", "CKMB", "CKMBINDEX", "TROPONINI" in the last 168 hours. BNP (last 3 results) No results for input(s): "PROBNP" in the last 8760 hours. HbA1C: No results for input(s): "HGBA1C" in the last 72 hours. CBG: Recent Labs  Lab 01/14/24 1537 01/14/24 2048 01/15/24 0050 01/15/24 0444 01/15/24 0809  GLUCAP 131* 137* 138* 142* 136*   Lipid Profile: No results for input(s): "CHOL", "HDL", "LDLCALC", "TRIG", "CHOLHDL", "LDLDIRECT" in the last 72 hours. Thyroid Function Tests: No results for input(s): "TSH", "T4TOTAL", "FREET4", "T3FREE", "THYROIDAB" in the last 72 hours. Anemia Panel: No results for input(s): "VITAMINB12", "FOLATE", "FERRITIN", "TIBC", "IRON", "RETICCTPCT" in the last 72 hours. Sepsis Labs: No results for input(s): "PROCALCITON", "LATICACIDVEN" in the last 168 hours.   No results found for this or any previous visit (from the past 240 hours).        Radiology Studies: No results found.      Scheduled Meds:  amLODipine   10 mg Per Tube Daily   artificial tears   Left Eye Q8H   Chlorhexidine  Gluconate Cloth  6 each Topical Daily   enoxaparin  (LOVENOX ) injection  50 mg Subcutaneous Daily   famotidine   20 mg Per Tube BID   feeding supplement (PROSource TF20)  60 mL Per Tube TID   free water   150 mL Per Tube Q4H   insulin  aspart  0-15 Units Subcutaneous Q4H   metoprolol  tartrate  100 mg Per Tube BID   multivitamin with minerals  1 tablet Per Tube Daily   nutrition supplement (JUVEN)  1 packet Per Tube BID BM   mouth rinse  15 mL Mouth Rinse 4 times per day    Continuous Infusions:  feeding supplement (OSMOLITE 1.5 CAL) 1,000 mL (01/13/24 0011)     LOS: 80 days    Time spent: 45 minutes spent on 01/15/2024 caring for this patient face-to-face including chart review, ordering labs/tests, documenting, discussion with nursing staff, consultants, updating family and interview/physical exam    Rema Care [place], DO [facility] Available via Epic secure chat 7am-7pm After these hours, please refer to coverage provider listed on [email or web address] 01/15/2024, 10:50 AM

## 2024-01-16 DIAGNOSIS — I619 Nontraumatic intracerebral hemorrhage, unspecified: Secondary | ICD-10-CM | POA: Diagnosis not present

## 2024-01-16 LAB — CBC
HCT: 38.1 % — ABNORMAL LOW (ref 39.0–52.0)
Hemoglobin: 11.9 g/dL — ABNORMAL LOW (ref 13.0–17.0)
MCH: 29.2 pg (ref 26.0–34.0)
MCHC: 31.2 g/dL (ref 30.0–36.0)
MCV: 93.6 fL (ref 80.0–100.0)
Platelets: 512 10*3/uL — ABNORMAL HIGH (ref 150–400)
RBC: 4.07 MIL/uL — ABNORMAL LOW (ref 4.22–5.81)
RDW: 14.2 % (ref 11.5–15.5)
WBC: 8.4 10*3/uL (ref 4.0–10.5)
nRBC: 0 % (ref 0.0–0.2)

## 2024-01-16 LAB — COMPREHENSIVE METABOLIC PANEL WITH GFR
ALT: 257 U/L — ABNORMAL HIGH (ref 0–44)
AST: 68 U/L — ABNORMAL HIGH (ref 15–41)
Albumin: 3.1 g/dL — ABNORMAL LOW (ref 3.5–5.0)
Alkaline Phosphatase: 65 U/L (ref 38–126)
Anion gap: 7 (ref 5–15)
BUN: 26 mg/dL — ABNORMAL HIGH (ref 6–20)
CO2: 28 mmol/L (ref 22–32)
Calcium: 9.1 mg/dL (ref 8.9–10.3)
Chloride: 100 mmol/L (ref 98–111)
Creatinine, Ser: 0.59 mg/dL — ABNORMAL LOW (ref 0.61–1.24)
GFR, Estimated: >60 mL/min (ref >60)
Glucose, Bld: 122 mg/dL — ABNORMAL HIGH (ref 70–99)
Potassium: 3.9 mmol/L (ref 3.5–5.1)
Sodium: 135 mmol/L (ref 135–145)
Total Bilirubin: 0.6 mg/dL (ref 0.0–1.2)
Total Protein: 7.2 g/dL (ref 6.5–8.1)

## 2024-01-16 LAB — GLUCOSE, CAPILLARY
Glucose-Capillary: 100 mg/dL — ABNORMAL HIGH (ref 70–99)
Glucose-Capillary: 109 mg/dL — ABNORMAL HIGH (ref 70–99)
Glucose-Capillary: 132 mg/dL — ABNORMAL HIGH (ref 70–99)
Glucose-Capillary: 92 mg/dL (ref 70–99)
Glucose-Capillary: 97 mg/dL (ref 70–99)
Glucose-Capillary: 99 mg/dL (ref 70–99)

## 2024-01-16 LAB — MAGNESIUM: Magnesium: 2.1 mg/dL (ref 1.7–2.4)

## 2024-01-16 LAB — PHOSPHORUS: Phosphorus: 4.2 mg/dL (ref 2.5–4.6)

## 2024-01-16 MED ORDER — OSMOLITE 1.5 CAL PO LIQD
237.0000 mL | Freq: Every day | ORAL | Status: DC
  Administered 2024-01-16 – 2024-02-25 (×183): 237 mL
  Filled 2024-01-16 (×20): qty 237
  Filled 2024-01-16: qty 711
  Filled 2024-01-16 (×13): qty 237

## 2024-01-16 MED ORDER — FREE WATER
150.0000 mL | Status: DC
  Administered 2024-01-16 – 2024-02-25 (×226): 150 mL

## 2024-01-16 MED ORDER — INSULIN ASPART 100 UNIT/ML IJ SOLN
0.0000 [IU] | INTRAMUSCULAR | Status: DC
  Administered 2024-01-16: 2 [IU] via SUBCUTANEOUS
  Administered 2024-01-17: 3 [IU] via SUBCUTANEOUS
  Administered 2024-01-17 – 2024-01-18 (×4): 2 [IU] via SUBCUTANEOUS
  Administered 2024-01-19: 3 [IU] via SUBCUTANEOUS
  Administered 2024-01-19 – 2024-01-21 (×5): 2 [IU] via SUBCUTANEOUS
  Administered 2024-01-21: 3 [IU] via SUBCUTANEOUS
  Administered 2024-01-21: 2 [IU] via SUBCUTANEOUS
  Administered 2024-01-22: 3 [IU] via SUBCUTANEOUS
  Administered 2024-01-22: 2 [IU] via SUBCUTANEOUS
  Administered 2024-01-23 (×2): 3 [IU] via SUBCUTANEOUS
  Administered 2024-01-23 – 2024-01-24 (×4): 2 [IU] via SUBCUTANEOUS
  Administered 2024-01-25: 3 [IU] via SUBCUTANEOUS
  Administered 2024-01-25: 2 [IU] via SUBCUTANEOUS
  Administered 2024-01-27: 5 [IU] via SUBCUTANEOUS
  Administered 2024-01-27: 3 [IU] via SUBCUTANEOUS
  Administered 2024-01-28 – 2024-01-29 (×4): 2 [IU] via SUBCUTANEOUS
  Administered 2024-01-29 (×2): 3 [IU] via SUBCUTANEOUS
  Administered 2024-01-29 – 2024-01-30 (×3): 2 [IU] via SUBCUTANEOUS
  Administered 2024-01-30 – 2024-01-31 (×2): 3 [IU] via SUBCUTANEOUS
  Administered 2024-01-31 – 2024-02-02 (×4): 2 [IU] via SUBCUTANEOUS
  Administered 2024-02-03 – 2024-02-04 (×2): 3 [IU] via SUBCUTANEOUS
  Administered 2024-02-05 – 2024-02-14 (×10): 2 [IU] via SUBCUTANEOUS
  Administered 2024-02-15 (×2): 3 [IU] via SUBCUTANEOUS
  Administered 2024-02-15 – 2024-02-16 (×5): 2 [IU] via SUBCUTANEOUS
  Administered 2024-02-16: 3 [IU] via SUBCUTANEOUS
  Administered 2024-02-16 – 2024-02-17 (×3): 2 [IU] via SUBCUTANEOUS
  Administered 2024-02-18 (×3): 3 [IU] via SUBCUTANEOUS
  Administered 2024-02-18: 2 [IU] via SUBCUTANEOUS
  Administered 2024-02-19: 3 [IU] via SUBCUTANEOUS
  Administered 2024-02-19 (×4): 2 [IU] via SUBCUTANEOUS
  Administered 2024-02-20 – 2024-02-21 (×4): 3 [IU] via SUBCUTANEOUS
  Administered 2024-02-21: 2 [IU] via SUBCUTANEOUS
  Administered 2024-02-21: 3 [IU] via SUBCUTANEOUS
  Administered 2024-02-22 (×2): 2 [IU] via SUBCUTANEOUS
  Administered 2024-02-22: 3 [IU] via SUBCUTANEOUS
  Administered 2024-02-23 (×2): 2 [IU] via SUBCUTANEOUS
  Administered 2024-02-23 (×2): 3 [IU] via SUBCUTANEOUS
  Administered 2024-02-24: 2 [IU] via SUBCUTANEOUS
  Administered 2024-02-24: 3 [IU] via SUBCUTANEOUS
  Administered 2024-02-24 (×2): 2 [IU] via SUBCUTANEOUS
  Administered 2024-02-25 (×2): 3 [IU] via SUBCUTANEOUS

## 2024-01-16 MED ORDER — ENOXAPARIN SODIUM 40 MG/0.4ML IJ SOSY
40.0000 mg | PREFILLED_SYRINGE | Freq: Every day | INTRAMUSCULAR | Status: AC
  Administered 2024-01-17 – 2024-03-04 (×45): 40 mg via SUBCUTANEOUS
  Filled 2024-01-16 (×48): qty 0.4

## 2024-01-16 NOTE — Progress Notes (Signed)
 Nutrition Follow-up  DOCUMENTATION CODES:  Not applicable  INTERVENTION:  Continue tube feeding via PEG. Adjust to bolus feeds: 5 cartons of Osmolite 1.5 per day Prosource TF20 60 ml TID free water  6x/d Provides 2015 kcal, 134 gm protein, 900 ml free water  daily ( free water , TF + flush) MVI with minerals daily Discussed change in feeding regimen with MD and pharmacy, insulin  and CBGs adjusted to align with bolus feeding schedule  NUTRITION DIAGNOSIS:  Inadequate oral intake related to inability to eat as evidenced by NPO status. 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 UDS positive for opioids. Noted aspiration PNA on admission.   3/08 - admitted with [facility], intubated 3/14 - s/p cortrak placement (tip gastric) 3/22 - s/p tracheostomy 3/24 - s/p PEG 4/20 - Cuff changed to cuffless 5/10 - PEG dislodged, TF's stopped  5/11 - PEG replaced, TF's restarted at goal   Pt resting in bed at the time of assessment. No family present. Pt not communicative today. Reviewed chart and wounds have healed. Will discontinue juven. Pt has also been on continuous feeds since admission. Will adjust to bolus feeds via PEG to give GI tract time to rest. Discussed plan with RN and MD.   Admit weight: 103 kg  Current weight: 93.3 kg   Nutritionally Relevant Medications: Scheduled Meds:  famotidine   20 mg Per Tube BID   PROSource TF20  60 mL Per Tube TID   free water   150 mL Per Tube Q4H   insulin  aspart  0-15 Units Subcutaneous Q4H   multivitamin with minerals  1 tablet Per Tube Daily   JUVEN  1 packet Per Tube BID BM    Continuous Infusions:  feeding supplement (OSMOLITE 1.5 CAL) 1,000 mL (01/15/24 2209)   PRN Meds: bisacodyl , polyethylene glycol, prochlorperazine   Labs Reviewed: BUN 26, Creatinine 0.59 CBG ranges from 92-167 mg/dL over the last 24 hours HgbA1c 4.7%  Diet Order:   Diet Order             Diet NPO time specified  Diet effective now                   EDUCATION NEEDS:  No education needs have been identified at this time  Skin: Reviewed RN skin assessment  Last BM:  5/25 - type 5  Height:  Ht Readings from Last 1 Encounters:  11/22/23 5\' 8"  (1.727 m)    Weight:  Wt Readings from Last 1 Encounters:  01/16/24 90.7 kg    Ideal Body Weight:  70 kg  BMI:  Body mass index is 30.41 kg/m.  Estimated Nutritional Needs:  Kcal:  2000-2200 Protein:  125-140 grams Fluid:  >2 L/day    Edwena Graham, RD, LDN Registered Dietitian II Please reach out via secure chat

## 2024-01-16 NOTE — Plan of Care (Signed)
  Problem: Education: Goal: Knowledge of disease or condition will improve Outcome: Progressing Goal: Knowledge of secondary prevention will improve (MUST DOCUMENT ALL) Outcome: Progressing Goal: Knowledge of patient specific risk factors will improve (DELETE if not current risk factor) Outcome: Progressing   Problem: Intracerebral Hemorrhage Tissue Perfusion: Goal: Complications of Intracerebral Hemorrhage will be minimized Outcome: Progressing   Problem: Coping: Goal: Will verbalize positive feelings about self Outcome: Progressing Goal: Will identify appropriate support needs Outcome: Progressing   Problem: Health Behavior/Discharge Planning: Goal: Ability to manage health-related needs will improve Outcome: Progressing Goal: Goals will be collaboratively established with patient/family Outcome: Progressing   Problem: Self-Care: Goal: Ability to participate in self-care as condition permits will improve Outcome: Progressing Goal: Verbalization of feelings and concerns over difficulty with self-care will improve Outcome: Progressing Goal: Ability to communicate needs accurately will improve Outcome: Progressing   Problem: Nutrition: Goal: Risk of aspiration will decrease Outcome: Progressing Goal: Dietary intake will improve Outcome: Progressing   Problem: Education: Goal: Ability to describe self-care measures that may prevent or decrease complications (Diabetes Survival Skills Education) will improve Outcome: Progressing   Problem: Coping: Goal: Ability to adjust to condition or change in health will improve Outcome: Progressing   Problem: Fluid Volume: Goal: Ability to maintain a balanced intake and output will improve Outcome: Progressing   Problem: Health Behavior/Discharge Planning: Goal: Ability to identify and utilize available resources and services will improve Outcome: Progressing Goal: Ability to manage health-related needs will improve Outcome:  Progressing   Problem: Metabolic: Goal: Ability to maintain appropriate glucose levels will improve Outcome: Progressing   Problem: Nutritional: Goal: Maintenance of adequate nutrition will improve Outcome: Progressing Goal: Progress toward achieving an optimal weight will improve Outcome: Progressing   Problem: Skin Integrity: Goal: Risk for impaired skin integrity will decrease Outcome: Progressing   Problem: Tissue Perfusion: Goal: Adequacy of tissue perfusion will improve Outcome: Progressing   Problem: Education: Goal: Knowledge of General Education information will improve Description: Including pain rating scale, medication(s)/side effects and non-pharmacologic comfort measures Outcome: Progressing   Problem: Health Behavior/Discharge Planning: Goal: Ability to manage health-related needs will improve Outcome: Progressing   Problem: Clinical Measurements: Goal: Ability to maintain clinical measurements within normal limits will improve Outcome: Progressing Goal: Will remain free from infection Outcome: Progressing Goal: Diagnostic test results will improve Outcome: Progressing Goal: Respiratory complications will improve Outcome: Progressing Goal: Cardiovascular complication will be avoided Outcome: Progressing   Problem: Activity: Goal: Risk for activity intolerance will decrease Outcome: Progressing   Problem: Nutrition: Goal: Adequate nutrition will be maintained Outcome: Progressing   Problem: Coping: Goal: Level of anxiety will decrease Outcome: Progressing   Problem: Elimination: Goal: Will not experience complications related to bowel motility Outcome: Progressing Goal: Will not experience complications related to urinary retention Outcome: Progressing   Problem: Pain Managment: Goal: General experience of comfort will improve and/or be controlled Outcome: Progressing   Problem: Safety: Goal: Ability to remain free from injury will  improve Outcome: Progressing   Problem: Skin Integrity: Goal: Risk for impaired skin integrity will decrease Outcome: Progressing   Problem: Education: Goal: 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 Outcome: Progressing   Problem: Activity: Goal: Ability to tolerate increased activity will improve Outcome: Progressing   Problem: Health Behavior/Discharge Planning: Goal: Ability to manage tracheostomy will improve Outcome: Progressing   Problem: Respiratory: Goal: Patent airway maintenance will improve Outcome: Progressing   Problem: Role Relationship: Goal: Ability to communicate will improve Outcome: Progressing

## 2024-01-16 NOTE — Progress Notes (Addendum)
 PROGRESS NOTE    Garrett [name]  ZOX:[id number] DOB: 1976/01/23 DOA: 10/27/2023 PCP: Pcp, No   Brief Narrative: 48 year old with no known previous medical history was found down on 3/80/2025, unresponsive with UDS positive for opioids.  He was minimally responsive to Narcan  and he was found to be markedly hypertensive  blood pressure 262/156.  CT head notable for 4.1 cm intracranial hemorrhage at the pons with intraventricular extension into the fourth ventricle and basal cisterns.  Initially admitted by critical care, and now extubated with tracheostomy, initially placed on 3/20-second/2025.  PEG tube placed 11/08/2023.  Disposition remains difficult given patient's need for PEG and tracheostomy tube care.  Currently awaiting disability.  Significant hospital events: 3/8: Intubated in ED, pontine bleed/SAH. CT head Acute large hemorrhage 4.1 cm in pons with intraventricular extension without hydrocephalus. 3/9: CTA No change in IPH in pons, similar biparietal [facility] 3/14: Now awake, appears locked in. can follow commands with eyes. 3/16 Purposeful with left upper extremity  3/22 tracheostomy placed at bedside, bleeding issues overnight from trach site 3/24 PEG (Dr. Hildy Lowers) 3/24 sputum culture with MRSA 4/20: trach was changed to cuffless #8 5/1: Mental status appears to be somewhat improving off narcotics, more awake alert following simple commands(able to grimace, move left toes, track vertically with right eye).  Speech following. Trach downsized to [employer] cuffless #6 XLT 5/4: Modafinil  held, low threshold to resume if mental status/ability to interact diminishes 5/10: PEG dislodged, foley bulb placed and IR consulted to replace tube 5/11: IR replaced PEG tube 5/13 - 5/14: + Fever.  Restarted antibiotics.  Urine culture + E. Coli, foley catheter changed. 5/17 Tracheostomy changed.   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 - UDS positive for opioids.  CT head with acute large hemorrhage 4.1 cm in the pons, ventricular extension/ - Etiology likely secondary to hypertensive emergency with elevated systolic blood pressure 260/1 81X on admission. - Initially admitted to the intensive care unit under PCCM service and neurology following. - 2D echo ejection fraction 60%, A1c 4.7, LDL 96. - Was also treated with 3% hypertonic saline.  Follow-up CT head 3/29 with reduction in size of hemorrhage. - Patient underwent tracheostomy placement on 3/20-second/2025 and PEG tube placement on 11/08/2023 - Per speech 5/19 patient used PMV for duration of section with stable vital signs.  achieved spontaneous voicing but could not transition to purposeful voicing despite max cues    Acute respiratory failure secondary to large pontine stroke brainstem compression Unable to protect airway - Trach initially placed 3/20-second/2025 changed to cuffless/21st and exchanged for Shiley cuffless #6 XLT on 12/20/2023 - Pulmonologist following intermittently, given difficulty with phonation and inability to protect airway, not a candidate for decannulation  Hypertension: Continue amlodipine , metoprolol  As needed labetalol   Transaminases: CT abdomen and pelvis 5/10: No focal liver abnormality seen, no gallstone, gallbladder wall thickening or  biliary dilation -Occasions reviewed no significant medication that could cause transaminases.  He is  not getting significant amount of Tylenol  Will check ammonia level in the morning and will also check viral hepatitis panel  Aspiration pneumonia MRSA colonization of his trach tube Dysphagia in the setting of CVA.  Multiple tracheal aspirate has been growing MRSA He was treated with vancomycin  and subsequently transition to Zyvox  Aspiration precaution  E. coli UTI completed antibiotics course 5/18 Foley catheter was changed 5/13.  Moderate to severe protein caloric malnutrition, POA Body max index 31 Obesity type I PEG tube in place, continue tube feedings  PEG tube malfunction, resolved PEG dislodged 5/10, replaced on 5/11 by IR   Exposure keratopathy, OS Seen by ophthalmology, Dr. Mason Sole on 4/7.  Treated with maxitrol ointment (neo-poly-dex) TID OS for 1 week. -- Refresh PM TID to QID OS for chronic lubrication -- Can tape eyelid closed if still exposure an issue (plastic tape may work better than paper and would try to tape eyelid directly rather than with gauze to keep enough tension to ensure it isn't remaining open)   AKI with acute urinary retention Currently has a Foley catheter. Prior removals/voiding trial unsuccessful -- Last catheter change was on 5/13.   Macrocytic anemia thrombocytosis  stable/resolved   Perineal skin injury, POA  Continue Gerhard's Butt cream   Relative B12 and folate deficiency Continue replacement with MVI    Goals of care conversation.   Changed to DNR on 3/13.       Nutrition Problem: Inadequate oral intake Etiology: inability to eat    Signs/Symptoms: NPO status    Interventions: Tube feeding, Prostat, MVI, Juven  Estimated body mass index is 30.41 kg/m as calculated from the following:   Height as of this encounter: 5\' 8"  (1.727 m).   Weight as of this encounter: 90.7 kg.   DVT prophylaxis: Lovenox  Code Status:  DNR-intervention Family Communication: Disposition Plan:  Status is: Inpatient Remains inpatient appropriate because: Management of possible stroke complication    Consultants:  Neurology CCM Palliative Care General Surgery for PEG tube placement Ophthalmology, Dr. Mason Sole Interventional radiology for G-tube exchange 5/1  Procedures:  EEG 3/10 Percutaneous tracheostomy, Dr. Zaida Hertz 3/22 Noster bronchoscopy 3/22; Dr. Diania Fortes PEG tube placement, Dr. Hildy Lowers 3/24 PEG tube exchange, IR 5/11    Antimicrobials:  Ceftriaxone  5/13 - 5/18 Vancomycin  3/24 - 4/8, 5/14 - 5/16 Cefepime  5/14 - 5/15 Linezolid  4/9 - 4/15 Unasyn  3/23 - 3/24    Subjective: Eyes open, trach in place, nonverbal, poorly interactive  Objective: Vitals:   01/16/24 0922 01/16/24 1141 01/16/24 1203 01/16/24 1235  BP: (!) 141/97   (!) 133/96  Pulse: 92  77 76  Resp: 18  16 16   Temp: 98.8 F (37.1 C)   98.4 F (36.9 C)  TempSrc: Oral   Axillary  SpO2: 100%  100% 100%  Weight:  90.7 kg    Height:        Intake/Output Summary (Last 24 hours) at 01/16/2024 1512 Last data filed at 01/16/2024 1235 Gross per 24 hour  Intake 8686.67 ml  Output 1400 ml  Net 7286.67 ml   Filed Weights   01/11/24 0500 01/15/24 0500 01/16/24 1141  Weight: 93.4 kg 93.3 kg 90.7 kg    Examination:  General exam: Appears calm and comfortable , left eye cover Respiratory system: Clear to auscultation. Respiratory effort normal. Trach in placed Cardiovascular system: S1 & S2 heard, RRR.  Gastrointestinal system: Abdomen is nondistended, soft and nontender. No organomegaly or masses felt. Normal bowel sounds heard. Central nervous system: Alert Extremities: Symmetric 5  x 5 power.    Data Reviewed: I have personally reviewed following labs and imaging studies  CBC: Recent Labs  Lab 01/14/24 0612 01/16/24 0549  WBC 8.5 8.4  HGB 11.8* 11.9*  HCT 37.5* 38.1*  MCV 92.8 93.6  PLT 519* 512*   Basic Metabolic Panel: Recent  Labs  Lab 01/14/24 0612 01/16/24 0549  NA 137 135  K 3.6 3.9  CL 99 100  CO2 29 28  GLUCOSE 147* 122*  BUN 24* 26*  CREATININE 0.61 0.59*  CALCIUM 9.3 9.1  MG 2.0 2.1  PHOS 4.2 4.2   GFR: Estimated Creatinine Clearance: 124.8 mL/min (A) (by C-G formula based on SCr of 0.59 mg/dL (L)). Liver Function Tests: Recent Labs  Lab 01/14/24 0612 01/16/24 0549  AST 68* 68*  ALT 259* 257*  ALKPHOS 60 65  BILITOT 0.5 0.6  PROT 7.1 7.2  ALBUMIN  2.9* 3.1*   No results for input(s): "LIPASE", "AMYLASE" in the last 168 hours. No results for input(s): "AMMONIA" in the last 168 hours. Coagulation Profile: No results for input(s): "INR", "PROTIME" in the last 168 hours. Cardiac Enzymes: No results for input(s): "CKTOTAL", "CKMB", "CKMBINDEX", "TROPONINI" in the last 168 hours. BNP (last 3 results) No results for input(s): "PROBNP" in the last 8760 hours. HbA1C: No results for input(s): "HGBA1C" in the last 72 hours. CBG: Recent Labs  Lab 01/15/24 1957 01/16/24 0044 01/16/24 0440 01/16/24 0926 01/16/24 1232  GLUCAP 100* 92 99 132* 109*   Lipid Profile: No results for input(s): "CHOL", "HDL", "LDLCALC", "TRIG", "CHOLHDL", "LDLDIRECT" in the last 72 hours. Thyroid Function Tests: No results for input(s): "TSH", "T4TOTAL", "FREET4", "T3FREE", "THYROIDAB" in the last 72 hours. Anemia Panel: No results for input(s): "VITAMINB12", "FOLATE", "FERRITIN", "TIBC", "IRON", "RETICCTPCT" in the last 72 hours. Sepsis Labs: No results for input(s): "PROCALCITON", "LATICACIDVEN" in the last 168 hours.  No results found for this or any previous visit (from the past 240 hours).       Radiology Studies: No results found.      Scheduled Meds:  amLODipine   10 mg Per Tube Daily   artificial tears   Left Eye Q8H   Chlorhexidine  Gluconate Cloth  6 each Topical Daily   [START ON 01/17/2024] enoxaparin  (LOVENOX ) injection  40 mg Subcutaneous Daily   famotidine   20 mg Per Tube BID    feeding supplement (OSMOLITE 1.5 CAL)  237 mL Per Tube 5 X Daily   feeding supplement (PROSource TF20)  60 mL Per Tube TID   free water   150 mL Per Tube Q4H   insulin  aspart  0-15 Units Subcutaneous 5 times per day   metoprolol  tartrate  100 mg Per Tube BID   multivitamin with minerals  1 tablet Per Tube Daily   mouth rinse  15 mL Mouth Rinse 4 times per day   Continuous Infusions:   LOS: 81 days    Time spent: 35 minutes    Lizzy Hamre A Vinie Charity, MD [facility]   If 7PM-7AM, please contact night-coverage [email or web address]  01/16/2024, 3:12 PM

## 2024-01-16 NOTE — Plan of Care (Signed)
  Problem: Nutrition: Goal: Risk of aspiration will decrease Outcome: Progressing Goal: Dietary intake will improve Outcome: Progressing   Problem: Nutritional: Goal: Maintenance of adequate nutrition will improve Outcome: Progressing   Problem: Nutrition: Goal: Adequate nutrition will be maintained Outcome: Progressing   Problem: Skin Integrity: Goal: Risk for impaired skin integrity will decrease Outcome: Progressing

## 2024-01-17 DIAGNOSIS — I611 Nontraumatic intracerebral hemorrhage in hemisphere, cortical: Secondary | ICD-10-CM | POA: Diagnosis not present

## 2024-01-17 LAB — COMPREHENSIVE METABOLIC PANEL WITH GFR
ALT: 229 U/L — ABNORMAL HIGH (ref 0–44)
AST: 57 U/L — ABNORMAL HIGH (ref 15–41)
Albumin: 3 g/dL — ABNORMAL LOW (ref 3.5–5.0)
Alkaline Phosphatase: 63 U/L (ref 38–126)
Anion gap: 8 (ref 5–15)
BUN: 24 mg/dL — ABNORMAL HIGH (ref 6–20)
CO2: 29 mmol/L (ref 22–32)
Calcium: 9.4 mg/dL (ref 8.9–10.3)
Chloride: 99 mmol/L (ref 98–111)
Creatinine, Ser: 0.54 mg/dL — ABNORMAL LOW (ref 0.61–1.24)
GFR, Estimated: >60 mL/min (ref >60)
Glucose, Bld: 94 mg/dL (ref 70–99)
Potassium: 4.2 mmol/L (ref 3.5–5.1)
Sodium: 136 mmol/L (ref 135–145)
Total Bilirubin: 0.3 mg/dL (ref 0.0–1.2)
Total Protein: 7.1 g/dL (ref 6.5–8.1)

## 2024-01-17 LAB — GLUCOSE, CAPILLARY
Glucose-Capillary: 107 mg/dL — ABNORMAL HIGH (ref 70–99)
Glucose-Capillary: 109 mg/dL — ABNORMAL HIGH (ref 70–99)
Glucose-Capillary: 121 mg/dL — ABNORMAL HIGH (ref 70–99)
Glucose-Capillary: 133 mg/dL — ABNORMAL HIGH (ref 70–99)
Glucose-Capillary: 177 mg/dL — ABNORMAL HIGH (ref 70–99)
Glucose-Capillary: 84 mg/dL (ref 70–99)
Glucose-Capillary: 89 mg/dL (ref 70–99)
Glucose-Capillary: 97 mg/dL (ref 70–99)

## 2024-01-17 LAB — HEPATITIS PANEL, ACUTE
HCV Ab: NONREACTIVE
Hep A IgM: NONREACTIVE
Hep B C IgM: NONREACTIVE
Hepatitis B Surface Ag: NONREACTIVE

## 2024-01-17 LAB — AMMONIA: Ammonia: 65 umol/L — ABNORMAL HIGH (ref 9–35)

## 2024-01-17 MED ORDER — LACTULOSE 10 GM/15ML PO SOLN
20.0000 g | Freq: Every day | ORAL | Status: AC
  Administered 2024-01-17 – 2024-01-18 (×2): 20 g
  Filled 2024-01-17 (×2): qty 30

## 2024-01-17 MED ORDER — LACTULOSE ENEMA
300.0000 mL | Freq: Every day | ORAL | Status: DC
  Filled 2024-01-17 (×2): qty 300

## 2024-01-17 NOTE — Progress Notes (Signed)
 PROGRESS NOTE Garrett [name]  YQM:[id number] DOB: 1976/02/15 DOA: 10/27/2023 PCP: Pcp, No   Brief Narrative: 48 year old with no known previous medical history was found down on 3/80/2025, unresponsive with UDS positive for opioids.  He was minimally responsive to Narcan  and he was found to be markedly hypertensive-  blood pressure 262/156.  CT head notable for 4.1 cm intracranial hemorrhage at the pons with intraventricular extension into the fourth ventricle and basal cisterns.  Initially admitted by critical care, and now extubated with tracheostomy, initially placed on 3/20, replaced on 01/05/2024.  PEG tube placed 11/08/2023.  Disposition remains difficult given patient's need for PEG and tracheostomy tube care.  Currently awaiting disability.  Significant hospital events: 3/8: Intubated in ED, pontine bleed/SAH. CT head Acute large hemorrhage 4.1 cm in pons with intraventricular extension without hydrocephalus. 3/9: CTA No change in IPH in pons, similar biparietal [facility] 3/14: Now awake, appears locked in. can follow commands with eyes. 3/16 Purposeful with left upper extremity  3/22 tracheostomy placed at bedside, bleeding issues overnight from trach site 3/24 PEG (Dr. Hildy Lowers) 3/24 sputum culture with MRSA 4/20: trach was changed to cuffless #8 5/1: Mental status appears to be somewhat improving off narcotics, more awake alert following simple commands(able to grimace, move left toes, track vertically with right eye).  Speech following. Trach downsized to [employer] cuffless #6 XLT 5/4: Modafinil  held, low threshold to resume if mental status/ability to interact diminishes 5/10: PEG dislodged, foley bulb placed and IR consulted to replace tube 5/11: IR replaced PEG tube 5/13 - 5/14: + Fever.  Restarted antibiotics.  Urine culture + E. Coli, foley catheter changed. 5/17 Tracheostomy changed.   5/18: Completed antibiotics for UTI 5/23: Stable, no significant change, awaiting disability for  placement 5/26: Extensive conversation regarding overall poor prognosis with mother via telephone  Presently- stable. Trial of lactulose  enemas for mildly elevated ammonia and transaminases. Trial to wean O2, has been consistently on 6L via trach collar. Blood pressures are well controlled. Clinical status is overall unchanged from chart review. Still pending disability and dispo decision.   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  CT head with acute large hemorrhage 4.1 cm in the pons, ventricular extension. blood pressure 260/1 50s on admission. - Initially admitted to the intensive care unit under PCCM service and neurology following. - 2D echo ejection fraction 60%, A1c 4.7, LDL 96. - Was also treated with 3% hypertonic saline.  Follow-up CT head 3/29 with reduction in size of hemorrhage. - Patient underwent tracheostomy placement on 3/20-second/2025 and PEG tube placement on 11/08/2023 - Per speech 5/19 patient used PMV for duration of section with stable vital signs.  achieved spontaneous voicing but could not transition to purposeful voicing despite max cues   Acute respiratory failure secondary to large pontine stroke brainstem compression Unable to protect airway - Trach initially placed 3/20-second/2025 changed to cuffless/21st and exchanged for Shiley cuffless #6 XLT on 12/20/2023 - Pulmonologist following intermittently, given difficulty with phonation and inability to protect airway, not a candidate for decannulation  Hypertension: Continue amlodipine , metoprolol  As needed  labetalol   Transaminases: improving CT abdomen and pelvis 5/10: No focal liver  abnormality seen, no gallstone, gallbladder wall thickening or biliary dilation - no significant medication that could cause transaminases. - acute viral hepatitis panel pending - ammonia mildly elevated. 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  Multiple tracheal aspirate has been growing MRSA He was treated with vancomycin  and subsequently transition to Zyvox  which has been completed.  Aspiration precaution  E. coli UTI completed antibiotics course 5/18 Foley catheter was changed 5/13.  Moderate to severe protein caloric malnutrition, POA Body max index 31 Obesity type I PEG tube in place, continue tube feedings  PEG tube malfunction, resolved PEG dislodged 5/10, replaced on 5/11 by IR   Exposure keratopathy, OS Seen by ophthalmology, Dr. Mason Sole on 4/7.  Treated with maxitrol ointment (neo-poly-dex) TID OS for 1 week. -- Refresh PM TID to QID OS for chronic lubrication -- Can tape eyelid closed if still exposure an issue (plastic tape may work better than paper and would try to tape eyelid directly rather than with gauze to keep enough tension to ensure it isn't remaining open)   AKI with acute urinary retention Currently has a Foley catheter. Prior removals/voiding trial unsuccessful -- Last catheter change was on 5/13.   Macrocytic anemia thrombocytosis  stable/resolved   Perineal skin injury, POA  Continue Gerhard's Butt cream   Relative B12 and folate deficiency Continue replacement with MVI    Goals of care conversation.   Changed to DNR on 3/13.   Nutrition Problem: Inadequate oral intake Etiology: inability to eat   Signs/Symptoms: NPO status   Interventions: Tube feeding, Prostat, MVI, Juven  Estimated body mass index is 30.41 kg/m as calculated from the following:   Height as of this encounter: 5\' 8"   (1.727 m).   Weight as of this encounter: 90.7 kg.   DVT prophylaxis: Lovenox  Code Status: DNR-intervention Family Communication: fiancee at bedside Disposition Plan:  Status is: Inpatient Remains inpatient appropriate because: Management of possible stroke complication  Consultants:  Neurology CCM Palliative Care General Surgery for PEG tube placement Ophthalmology, Dr. Mason Sole Interventional radiology for G-tube exchange 5/1  Procedures:  EEG 3/10 Percutaneous tracheostomy, Dr. Zaida Hertz 3/22 Noster bronchoscopy 3/22; Dr. Diania Fortes PEG tube placement, Dr. Hildy Lowers 3/24 PEG tube exchange, IR 5/11    Antimicrobials:  Ceftriaxone  5/13 - 5/18 Vancomycin  3/24 - 4/8, 5/14 - 5/16 Cefepime  5/14 - 5/15 Linezolid  4/9 - 4/15 Unasyn  3/23 - 3/24    Subjective: Eyes open, trach in place, nonverbal, poorly interactive  Objective: Vitals:   01/17/24 0418 01/17/24 0735 01/17/24 0822 01/17/24 1149  BP:   (!) 132/96   Pulse: 68 81 80 75  Resp: 18 18 18 18   Temp:   98.8 F (37.1 C)   TempSrc:   Oral   SpO2: 99% 100% 100% 98%  Weight:      Height:        Intake/Output Summary (Last 24 hours) at 01/17/2024 1152 Last data filed at 01/17/2024 0700 Gross per 24 hour  Intake 1050 ml  Output 1250 ml  Net -200 ml   Filed Weights   01/11/24 0500 01/15/24 0500 01/16/24 1141  Weight: 93.4 kg 93.3 kg 90.7 kg    Examination:  General exam: Appears calm and comfortable Respiratory system: Clear to auscultation. Respiratory effort normal. Trach in placed Cardiovascular system: S1 & S2 heard, RRR.  Gastrointestinal system: Abdomen is nondistended, soft and nontender. No organomegaly or masses felt. Normal bowel sounds heard. Central nervous system: Alert  Extremities: Symmetric 5 x 5 power.  Data Reviewed: I have personally reviewed following labs and imaging studies  CBC: Recent Labs  Lab 01/14/24 0612 01/16/24 0549  WBC 8.5 8.4  HGB 11.8* 11.9*  HCT 37.5* 38.1*  MCV 92.8 93.6   PLT 519* 512*   Basic Metabolic Panel: Recent Labs  Lab 01/14/24 0612 01/16/24 0549 01/17/24 0523  NA 137 135 136  K 3.6 3.9 4.2  CL 99 100 99  CO2 29 28 29   GLUCOSE 147* 122* 94  BUN 24* 26* 24*  CREATININE 0.61 0.59* 0.54*  CALCIUM 9.3 9.1 9.4  MG 2.0 2.1  --   PHOS 4.2 4.2  --    GFR: Estimated Creatinine Clearance: 124.8 mL/min (A) (by C-G formula based on SCr of 0.54 mg/dL (L)). Liver Function Tests: Recent Labs  Lab 01/14/24 0612 01/16/24 0549 01/17/24 0523  AST 68* 68* 57*  ALT 259* 257* 229*  ALKPHOS 60 65 63  BILITOT 0.5 0.6 0.3  PROT 7.1 7.2 7.1  ALBUMIN  2.9* 3.1* 3.0*   No results for input(s): "LIPASE", "AMYLASE" in the last 168 hours. Recent Labs  Lab 01/17/24 0523  AMMONIA 65*   CBG: Recent Labs  Lab 01/16/24 2155 01/17/24 0010 01/17/24 0319 01/17/24 0620 01/17/24 0839  GLUCAP 100* 107* 89 121* 177*    Scheduled Meds:  amLODipine   10 mg Per Tube Daily   artificial tears   Left Eye Q8H   Chlorhexidine  Gluconate Cloth  6 each Topical Daily   enoxaparin  (LOVENOX ) injection  40 mg Subcutaneous Daily   famotidine   20 mg Per Tube BID   feeding supplement (OSMOLITE 1.5 CAL)  237 mL Per Tube 5 X Daily   feeding supplement (PROSource TF20)  60 mL Per Tube TID   free water   150 mL Per Tube Q4H   insulin  aspart  0-15 Units Subcutaneous 5 times per day   lactulose   300 mL Rectal Daily   metoprolol  tartrate  100 mg Per Tube BID   multivitamin with minerals  1 tablet Per Tube Daily   mouth rinse  15 mL Mouth Rinse 4 times per day   Continuous Infusions:   LOS: 82 days    Time spent: 45 minutes    Ree Candy, MD [facility]   If 7PM-7AM, please contact night-coverage [email or web address]  01/17/2024, 11:52 AM

## 2024-01-17 NOTE — Plan of Care (Signed)
  Problem: Education: Goal: Knowledge of disease or condition will improve Outcome: Progressing Goal: Knowledge of secondary prevention will improve (MUST DOCUMENT ALL) Outcome: Progressing Goal: Knowledge of patient specific risk factors will improve (DELETE if not current risk factor) Outcome: Progressing   Problem: Intracerebral Hemorrhage Tissue Perfusion: Goal: Complications of Intracerebral Hemorrhage will be minimized Outcome: Progressing   Problem: Coping: Goal: Will verbalize positive feelings about self Outcome: Progressing Goal: Will identify appropriate support needs Outcome: Progressing   Problem: Health Behavior/Discharge Planning: Goal: Ability to manage health-related needs will improve Outcome: Progressing Goal: Goals will be collaboratively established with patient/family Outcome: Progressing   Problem: Self-Care: Goal: Ability to participate in self-care as condition permits will improve Outcome: Progressing Goal: Verbalization of feelings and concerns over difficulty with self-care will improve Outcome: Progressing Goal: Ability to communicate needs accurately will improve Outcome: Progressing   Problem: Nutrition: Goal: Risk of aspiration will decrease Outcome: Progressing Goal: Dietary intake will improve Outcome: Progressing   Problem: Education: Goal: Ability to describe self-care measures that may prevent or decrease complications (Diabetes Survival Skills Education) will improve Outcome: Progressing   Problem: Coping: Goal: Ability to adjust to condition or change in health will improve Outcome: Progressing   Problem: Fluid Volume: Goal: Ability to maintain a balanced intake and output will improve Outcome: Progressing   Problem: Health Behavior/Discharge Planning: Goal: Ability to identify and utilize available resources and services will improve Outcome: Progressing Goal: Ability to manage health-related needs will improve Outcome:  Progressing   Problem: Metabolic: Goal: Ability to maintain appropriate glucose levels will improve Outcome: Progressing   Problem: Nutritional: Goal: Maintenance of adequate nutrition will improve Outcome: Progressing Goal: Progress toward achieving an optimal weight will improve Outcome: Progressing   Problem: Skin Integrity: Goal: Risk for impaired skin integrity will decrease Outcome: Progressing   Problem: Tissue Perfusion: Goal: Adequacy of tissue perfusion will improve Outcome: Progressing   Problem: Education: Goal: Knowledge of General Education information will improve Description: Including pain rating scale, medication(s)/side effects and non-pharmacologic comfort measures Outcome: Progressing   Problem: Health Behavior/Discharge Planning: Goal: Ability to manage health-related needs will improve Outcome: Progressing   Problem: Clinical Measurements: Goal: Ability to maintain clinical measurements within normal limits will improve Outcome: Progressing Goal: Will remain free from infection Outcome: Progressing Goal: Diagnostic test results will improve Outcome: Progressing Goal: Respiratory complications will improve Outcome: Progressing Goal: Cardiovascular complication will be avoided Outcome: Progressing   Problem: Activity: Goal: Risk for activity intolerance will decrease Outcome: Progressing   Problem: Nutrition: Goal: Adequate nutrition will be maintained Outcome: Progressing   Problem: Coping: Goal: Level of anxiety will decrease Outcome: Progressing   Problem: Elimination: Goal: Will not experience complications related to bowel motility Outcome: Progressing Goal: Will not experience complications related to urinary retention Outcome: Progressing   Problem: Pain Managment: Goal: General experience of comfort will improve and/or be controlled Outcome: Progressing   Problem: Safety: Goal: Ability to remain free from injury will  improve Outcome: Progressing   Problem: Skin Integrity: Goal: Risk for impaired skin integrity will decrease Outcome: Progressing   Problem: Education: Goal: 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 Outcome: Progressing   Problem: Activity: Goal: Ability to tolerate increased activity will improve Outcome: Progressing   Problem: Health Behavior/Discharge Planning: Goal: Ability to manage tracheostomy will improve Outcome: Progressing   Problem: Respiratory: Goal: Patent airway maintenance will improve Outcome: Progressing   Problem: Role Relationship: Goal: Ability to communicate will improve Outcome: Progressing

## 2024-01-17 NOTE — Plan of Care (Signed)
   Problem: Coping: Goal: Ability to adjust to condition or change in health will improve Outcome: Progressing   Problem: Fluid Volume: Goal: Ability to maintain a balanced intake and output will improve Outcome: Progressing   Problem: Metabolic: Goal: Ability to maintain appropriate glucose levels will improve Outcome: Progressing

## 2024-01-18 DIAGNOSIS — I611 Nontraumatic intracerebral hemorrhage in hemisphere, cortical: Secondary | ICD-10-CM

## 2024-01-18 LAB — BASIC METABOLIC PANEL WITH GFR
Anion gap: 11 (ref 5–15)
BUN: 19 mg/dL (ref 6–20)
CO2: 26 mmol/L (ref 22–32)
Calcium: 9.3 mg/dL (ref 8.9–10.3)
Chloride: 98 mmol/L (ref 98–111)
Creatinine, Ser: 0.59 mg/dL — ABNORMAL LOW (ref 0.61–1.24)
GFR, Estimated: >60 mL/min (ref >60)
Glucose, Bld: 129 mg/dL — ABNORMAL HIGH (ref 70–99)
Potassium: 3.9 mmol/L (ref 3.5–5.1)
Sodium: 135 mmol/L (ref 135–145)

## 2024-01-18 LAB — GLUCOSE, CAPILLARY
Glucose-Capillary: 105 mg/dL — ABNORMAL HIGH (ref 70–99)
Glucose-Capillary: 111 mg/dL — ABNORMAL HIGH (ref 70–99)
Glucose-Capillary: 101 mg/dL — ABNORMAL HIGH (ref 70–99)
Glucose-Capillary: 115 mg/dL — ABNORMAL HIGH (ref 70–99)
Glucose-Capillary: 133 mg/dL — ABNORMAL HIGH (ref 70–99)
Glucose-Capillary: 123 mg/dL — ABNORMAL HIGH (ref 70–99)

## 2024-01-18 LAB — AMMONIA: Ammonia: 38 umol/L — ABNORMAL HIGH (ref 9–35)

## 2024-01-18 MED ORDER — LOSARTAN POTASSIUM 25 MG PO TABS
25.0000 mg | ORAL_TABLET | Freq: Every day | ORAL | Status: DC
  Administered 2024-01-18 – 2024-01-22 (×5): 25 mg
  Filled 2024-01-18 (×5): qty 1

## 2024-01-18 NOTE — Progress Notes (Signed)
 PROGRESS NOTE Garrett [name]  JXB:[id number] DOB: 04-28-1976 DOA: 10/27/2023 PCP: Pcp, No   Brief Narrative: 48 year old with no known previous medical history was found down on 3/80/2025, unresponsive with UDS positive for opioids.  He was minimally responsive to Narcan  and he was found to be markedly hypertensive-  blood pressure 262/156.  CT head notable for 4.1 cm intracranial hemorrhage at the pons with intraventricular extension into the fourth ventricle and basal cisterns.  Initially admitted by critical care, and now extubated with tracheostomy, initially placed on 3/20, replaced on 01/05/2024.  PEG tube placed 11/08/2023.  Disposition remains difficult given patient's need for PEG and tracheostomy tube care.  Currently awaiting disability.  Significant hospital events: 3/8: Intubated in ED, pontine bleed/SAH. CT head Acute large hemorrhage 4.1 cm in pons with intraventricular extension without hydrocephalus. 3/9: CTA No change in IPH in pons, similar biparietal [facility] 3/14: Now awake, appears locked in. can follow commands with eyes. 3/16 Purposeful with left upper extremity  3/22 tracheostomy placed at bedside, bleeding issues overnight from trach site 3/24 PEG (Dr. Hildy Lowers) 3/24 sputum culture with MRSA 4/20: trach was changed to cuffless #8 5/1: Mental status appears to be somewhat improving off narcotics, more awake alert following simple commands(able to grimace, move left toes, track vertically with right eye).  Speech following. Trach downsized to [employer] cuffless #6 XLT 5/4: Modafinil  held, low threshold to resume if mental status/ability to interact diminishes 5/10: PEG dislodged, foley bulb placed and IR consulted to replace tube 5/11: IR replaced PEG tube 5/13 - 5/14: + Fever.  Restarted antibiotics.  Urine culture + E. Coli, foley catheter changed. 5/17 Tracheostomy changed.   5/18: Completed antibiotics for UTI 5/23: Stable, no significant change, awaiting disability for  placement 5/26: Extensive conversation regarding overall poor prognosis with mother via telephone  Presently- stable. Trial of lactulose  for mildly elevated ammonia and transaminases. Trial to wean O2, has been consistently on 6L via trach collar. Blood pressures are well controlled. Clinical status is overall unchanged from chart review. Still pending disability and dispo decision.   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  CT head with acute large hemorrhage 4.1 cm in the pons, ventricular extension. blood pressure 260/1 50s on admission. - Initially admitted to the intensive care unit under PCCM service and neurology following. - 2D echo ejection fraction 60%, A1c 4.7, LDL 96. - Was also treated with 3% hypertonic saline.  Follow-up CT head 3/29 with reduction in size of hemorrhage. - Patient underwent tracheostomy placement on 3/20-second/2025 and PEG tube placement on 11/08/2023 - Per speech 5/19 patient used PMV for duration of section with stable vital signs.  achieved spontaneous voicing but could not transition to purposeful voicing despite max cues  - following commands with eyes and can look up and down when requested.   Acute respiratory failure secondary to large pontine stroke brainstem compression Unable to protect airway - Trach initially placed 3/20-second/2025 changed to cuffless/21st and exchanged for Shiley cuffless #6 XLT on 12/20/2023 - Pulmonologist following intermittently, given difficulty with phonation and inability to protect airway, not a candidate for  decannulation - wean O2 as tolerated  Hypertension: Continue amlodipine , metoprolol  - adding losartan  5/30 for continued elevated pressures on home regimen  Transaminases: improving CT abdomen and pelvis 5/10: No focal liver abnormality seen, no gallstone, gallbladder wall thickening or biliary dilation - no significant medication that could cause transaminases. - acute viral hepatitis panel pending - ammonia mildly elevated. 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  Multiple tracheal aspirate has been growing MRSA He was treated with vancomycin  and subsequently transition to Zyvox  which has been completed.  Aspiration precaution  E. coli UTI completed antibiotics course 5/18 Foley catheter was changed 5/13.  Moderate to severe protein caloric malnutrition, POA Body max index 31 Obesity type I PEG tube in place, continue tube feedings per RD  PEG tube malfunction, resolved PEG dislodged 5/10, replaced on 5/11 by IR   Exposure keratopathy, OS Seen by ophthalmology, Dr. Mason Sole on 4/7.  Treated with maxitrol ointment (neo-poly-dex) TID OS for 1 week. -- Refresh PM TID to QID OS for chronic lubrication -- Can tape eyelid closed if still exposure an issue (plastic tape may work better than paper and would try to tape eyelid directly rather than with gauze to keep enough tension to ensure it isn't remaining open)   AKI with acute urinary retention Currently has a Foley catheter. Prior removals/voiding trial unsuccessful -- Last catheter change was on 5/13.   Macrocytic anemia thrombocytosis  stable/resolved   Perineal skin injury, POA  Continue Gerhard's Butt cream   Relative B12 and folate deficiency Continue replacement with MVI    Goals of care conversation.   Changed to DNR on 3/13.   Nutrition Problem: Inadequate oral intake Etiology: inability to eat  Interventions: Tube feeding, Prostat, MVI,  Juven  Estimated body mass index is 30.41 kg/m as calculated from the following:   Height as of this encounter: 5\' 8"  (1.727 m).   Weight as of this encounter: 90.7 kg.   DVT prophylaxis: Lovenox  Code Status: DNR-intervention Family Communication: none at bedside Disposition Plan:  Status is: Inpatient Remains inpatient appropriate because: Management of possible stroke complication  Consultants:  Neurology CCM Palliative Care General Surgery for PEG tube placement Ophthalmology, Dr. Mason Sole Interventional radiology for G-tube exchange 5/1  Procedures:  EEG 3/10 Percutaneous tracheostomy, Dr. Zaida Hertz 3/22 Noster bronchoscopy 3/22; Dr. Diania Fortes PEG tube placement, Dr. Hildy Lowers 3/24 PEG tube exchange, IR 5/11    Antimicrobials:  Ceftriaxone  5/13 - 5/18 Vancomycin  3/24 - 4/8, 5/14 - 5/16 Cefepime  5/14 - 5/15 Linezolid  4/9 - 4/15 Unasyn  3/23 - 3/24   Subjective: Eyes open, trach in place, nonverbal. Able to follow command to look up and look down. No other signs of movement or communication  Objective: Vitals:   01/18/24 0008 01/18/24 0452 01/18/24 0454 01/18/24 0734  BP: (!) 139/97 (!) 143/98    Pulse: 77 80 80 87  Resp: 15 18 16 18   Temp: 98.5 F (36.9 C) 98.7 F (37.1 C)    TempSrc: Oral Oral    SpO2: 96% 98% 98% 94%  Weight:      Height:        Intake/Output Summary (Last 24 hours) at 01/18/2024 0758 Last data filed at 01/18/2024 0624 Gross per 24 hour  Intake 750 ml  Output 1900 ml  Net -1150 ml   Filed Weights   01/11/24 0500 01/15/24 0500 01/16/24 1141  Weight: 93.4 kg 93.3 kg 90.7 kg   Examination: General exam: Appears calm and comfortable Respiratory system:  Clear to auscultation. Respiratory effort normal. Trach in place Cardiovascular system: S1 & S2 heard, RRR.  Gastrointestinal system: Abdomen is nondistended, soft and nontender. No organomegaly or masses felt. Normal bowel sounds heard. Central nervous system: Alert  Data Reviewed: I have  personally reviewed following labs and imaging studies  CBC: Recent Labs  Lab 01/14/24 0612 01/16/24 0549  WBC 8.5 8.4  HGB 11.8* 11.9*  HCT 37.5* 38.1*  MCV 92.8 93.6  PLT 519* 512*   Basic Metabolic Panel: Recent Labs  Lab 01/14/24 0612 01/16/24 0549 01/17/24 0523  NA 137 135 136  K 3.6 3.9 4.2  CL 99 100 99  CO2 29 28 29   GLUCOSE 147* 122* 94  BUN 24* 26* 24*  CREATININE 0.61 0.59* 0.54*  CALCIUM 9.3 9.1 9.4  MG 2.0 2.1  --   PHOS 4.2 4.2  --    GFR: Estimated Creatinine Clearance: 124.8 mL/min (A) (by C-G formula based on SCr of 0.54 mg/dL (L)). Liver Function Tests: Recent Labs  Lab 01/14/24 0612 01/16/24 0549 01/17/24 0523  AST 68* 68* 57*  ALT 259* 257* 229*  ALKPHOS 60 65 63  BILITOT 0.5 0.6 0.3  PROT 7.1 7.2 7.1  ALBUMIN  2.9* 3.1* 3.0*   No results for input(s): "LIPASE", "AMYLASE" in the last 168 hours. Recent Labs  Lab 01/17/24 0523  AMMONIA 65*   CBG: Recent Labs  Lab 01/17/24 1552 01/17/24 1744 01/17/24 2003 01/18/24 0451 01/18/24 0619  GLUCAP 84 133* 97 105* 111*    Scheduled Meds:  amLODipine   10 mg Per Tube Daily   artificial tears   Left Eye Q8H   Chlorhexidine  Gluconate Cloth  6 each Topical Daily   enoxaparin  (LOVENOX ) injection  40 mg Subcutaneous Daily   famotidine   20 mg Per Tube BID   feeding supplement (OSMOLITE 1.5 CAL)  237 mL Per Tube 5 X Daily   feeding supplement (PROSource TF20)  60 mL Per Tube TID   free water   150 mL Per Tube Q4H   insulin  aspart  0-15 Units Subcutaneous 5 times per day   lactulose   20 g Per Tube Daily   metoprolol  tartrate  100 mg Per Tube BID   multivitamin with minerals  1 tablet Per Tube Daily   mouth rinse  15 mL Mouth Rinse 4 times per day   Continuous Infusions:   LOS: 83 days    Time spent: 35 minutes    Ree Candy, MD [facility]   If 7PM-7AM, please contact night-coverage [email or web address]  01/18/2024, 7:58 AM

## 2024-01-18 NOTE — Plan of Care (Signed)
  Problem: Pain Managment: Goal: General experience of comfort will improve and/or be controlled Outcome: Progressing   Problem: Skin Integrity: Goal: Risk for impaired skin integrity will decrease Outcome: Progressing   Problem: Respiratory: Goal: Patent airway maintenance will improve Outcome: Progressing

## 2024-01-18 NOTE — Progress Notes (Signed)
 NAME:  Garrett [name], MRN:  [id number], DOB:  August 13, 1976, LOS: 83 ADMISSION DATE:  10/27/2023, CONSULTATION DATE:  10/27/2023 REFERRING MD:  Florentino Hurdle, DO CHIEF COMPLAINT:  Found Down  History of Present Illness:  48 y/o male with past medical history of hypertension who was found down for unknown downtime by his fiance when she came home from work. Reportedly, having agonal breathing.  He apparently had driven her to work this morning.  He has HTN but never checks his BP and does not follow with MD.  She says you can tell when his BP is really high; his eyes get blood shot and he is sweating.  No h/o seizures.  Besides almost daily [name] and cigarette smoking, she is not aware of any other drugs. Drug screen positive for opioids and he was given several doses of Narcan . Patient found to have:  1. 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 Basal cisterns and fourth ventricle are effaced without hydrocephalus at this time. 2. Trace additional subarachnoid hemorrhage along the left parietal convexity. BP in ED 262/156. His CXR revealed a RUL and perihilar infiltrates suggesting aspiration pneumonia. ED labs also revealed PH 7.169, LA 4.4, CPK 985. Patient was intubated in ED.  Pertinent  Medical History  hypertension  Significant Hospital Events: Including procedures, antibiotic start and stop dates in addition to other pertinent events   3/8: Intubated in ED, pontine bleed/SAH. CT head 17:09 Acute large hemorrhage 4.1 cm in pons with intraventricular extension without hydrocephalus 3/9: CTA 03:14 AM No change in IPH in pons, similar biparietal [facility] 3/14: Now awake, appears locked in can follow commands with eyes. 3/16 Purposeful with left upper extremity  3/22 tracheostomy placed at bedside, bleeding issues overnight from trach site 3/24 PEG (Dr. Hildy Lowers) 3/24  sputum culture with MRSA 3/27 vomited. TF on hold. Abd film c/w ileus. Added reglan , got SSE. Did tolerate PSV all day  3/28 BMs x2 after SSE day prior. Added back TFs at 1/2 rated. Tolerated PS 3/29 Tolerated PS  3/31 Tolerating CPAP PS 15/5, TF on hold due to ileus. 4/1: con't to hold tube feeds today, restarting vancomycin  and sending tracheal aspirate as peaks are 37, plat 24, driving 19. Thick secretions. Fever overnight.  4/2: peaks improved. Ongoing hiccups. Trickle feeding. Neuro exam unchanged.  4/20: trach was changed to cuffless #8 4/28: not safe to swallow. Limited communication and he follows simple commands. On TF 5/1: on TC 28%, with large secretions. Working with speech and he is improving to initiate PMV trials under ST supervision   5/5: on TC 30%, 7 L/min. Trach #6 cuffless, changed 5/1 to facilitate PMV trials  5/12: on TC 21%, 6 L/min. Trach #6 cuffless, unable to produce sounds or speak on PMV. No significant resp secretions. On PEG TF  Interim History / Subjective:   Has been doing well on trach collar  Objective   Blood pressure (!) 147/93, pulse 83, temperature 98.7 F (37.1 C), temperature source Axillary, resp. rate 18, height 5\' 8"  (1.727 m), weight 90.7 kg, SpO2 100%.    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   Continue trach collar No plans for  decannulation   Siegfried Dress Cherrill Scrima ACNP Acute Care Nurse Practitioner [facility] Pulmonary/Critical Care Please consult Amion 01/18/2024, 10:13 AM

## 2024-01-18 NOTE — Progress Notes (Signed)
 Spoke with Bell from lab, notified her that lab for 0800 has not been drawn yet.

## 2024-01-18 NOTE — Progress Notes (Signed)
 Speech Language Pathology Treatment: Cognitive-Linquistic;Passy Muir Speaking valve  Patient Details Name: Garrett [name] MRN: [id number] DOB: 16-Feb-1976 Today's Date: 01/18/2024 Time: 0865-7846 SLP Time Calculation (min) (ACUTE ONLY): 40 min  Assessment / Plan / Recommendation Clinical Impression  Co-treated with OT in order to optimize participation and respiratory support for voicing and functional communication. Pt sat EOB with OT facilitation and support of trunk/head in order to work on communication.  Pt was very alert and demonstrated intentionality. He answered yes/no questions about biographical/orientation info with 90% accuracy using vertical yes/no signs and sustaining gaze to target word. He used PMV for entirety of session with stable vital signs - achieved spontaneous voicing during cough/throat clearing but could not transition to purposeful voicing despite max cues. Oral care provided; wet swab to lips elicited marginal movements. No spontaneous swallow was palpated in response to oral care.   Co-treating has been beneficial in eliciting more participation to address SLP goals; will continue to follow.      HPI HPI: Pt is a 48 yo male who was found down 10/27/23 with agonal breathing. 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 Intubated 3/8, cortrak placed 3/14, trach placed 3/22, PEG placed 3/24. PMH: HTN, smoker      SLP Plan  Continue with current plan of care      Recommendations for follow up therapy are one component of a multi-disciplinary discharge planning process, led by the attending physician.  Recommendations may be updated based on patient status, additional functional criteria and insurance authorization.    Recommendations  Diet recommendations: NPO Medication Administration: Via  alternative means      Patient may use Passy-Muir Speech Valve: During all therapies with supervision;Intermittently with supervision PMSV Supervision: Full           Oral care QID   Frequent or constant Supervision/Assistance Aphonia (R49.1);Cognitive communication deficit (R41.841)     Continue with current plan of care   Garrett Pettway L. Beatris Lincoln, MA CCC/SLP Clinical Specialist - Acute Care SLP Acute Rehabilitation Services Office number [phone]   Garrett [name]  01/18/2024, 3:15 PM

## 2024-01-18 NOTE — Progress Notes (Signed)
 Notified lab of CMP not drawn yet.

## 2024-01-18 NOTE — Progress Notes (Signed)
 Occupational Therapy Treatment Patient Details Name: Garrett [name] MRN: [id number] DOB: Feb 26, 1976 Today's Date: 01/18/2024   History of present illness Pt is a 48 yo male who was found down 10/27/23 with agonal breathing. 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 Intubated 3/8, cortrak placed 3/14, trach placed 3/22, PEG placed 3/24. PMH: HTN, smoker   OT comments  Pt following commands with L UE and visual communication board 100% accuracy. Pt engaged in session as able with deficits present. Pt total (A) for eob. Pt will require w/c with lateral supports and head support. Recommendation for skilled inpatient follow up therapy, <3 hours/day. Pt will need air mattress and hoyer.       If plan is discharge home, recommend the following:  Two people to help with walking and/or transfers;Two people to help with bathing/dressing/bathroom;Direct supervision/assist for medications management;Direct supervision/assist for financial management;Assist for transportation;Help with stairs or ramp for entrance   Equipment Recommendations  Wheelchair (measurements OT);Wheelchair cushion (measurements OT);Hospital bed;Hoyer lift    Recommendations for Other Services Speech consult    Precautions / Restrictions Precautions Precautions: Fall Recall of Precautions/Restrictions: Impaired Precaution/Restrictions Comments: trach, PEG, abdominal binder, SBP < 160 Required Braces or Orthoses: Other Brace Splint/Cast: Bil resting hand splints Splint/Cast - Date Prophylactic Dressing Applied (if applicable): 11/02/23 Other Brace: has bilateral prevalon boots       Mobility Bed Mobility Overal bed mobility: Needs Assistance Bed Mobility: Supine to Sit, Sit to Supine     Supine to sit: Total assist, +2 for physical assistance Sit to  supine: Total assist, +2 for physical assistance   General bed mobility comments: hob used to help with elevation of trunk. pt complete dependence on therapists    Transfers                         Balance Overall balance assessment: Needs assistance Sitting-balance support: Feet supported, No upper extremity supported Sitting balance-Leahy Scale: Zero                                     ADL either performed or assessed with clinical judgement   ADL Overall ADL's : Needs assistance/impaired                                       General ADL Comments: total care . pt dangled eob for OT / SLP session. pt following commands with visual task and with L hand grasp and release    Extremity/Trunk Assessment Upper Extremity Assessment Upper Extremity Assessment: Right hand dominant;RUE deficits/detail;LUE deficits/detail RUE Deficits / Details: flaccid, slight edema present RUE Sensation: decreased proprioception;decreased light touch RUE Coordination: decreased fine motor LUE Deficits / Details: pt demonstrates active release on command x5 this session. pt with delayed response for flexion and grasp of object. pt with increased response time with palmar input LUE Sensation: decreased proprioception LUE Coordination: decreased fine motor;decreased gross motor   Lower Extremity Assessment Lower Extremity Assessment: Defer to PT evaluation        Vision   Vision Assessment?: Vision impaired- to be further tested in functional context Additional Comments: pt tracking to R up quadrant with increased time and  following the object from R upper quadrant to lower midline. pt does not track horizontal. pt does not track L lower right quadrant. attempting to tape L eye with paper tape and roll to weight the eye as a trial for an eye weight with poor return demo. pt did not benefit from method. pt will need eye cover in the future that does not risk  rubbing the eye. pt currently with loose gauze.   Perception     Praxis     Communication Communication Communication: Impaired Factors Affecting Communication: Trach/intubated   Cognition Arousal: Alert Behavior During Therapy: Flat affect Cognition: Difficult to assess Difficult to assess due to: Impaired communication           OT - Cognition Comments: communicates with eye movement                 Following commands: Impaired Following commands impaired: Follows one step commands with increased time      Cueing   Cueing Techniques: Verbal cues  Exercises Other Exercises Other Exercises: visual tracking Other Exercises: visual selection by holding gaze at object named without other cues-- pt told look at the tooth paste and no matter if it was up or down pt correctly selected the object Other Exercises: pt using communication board yes no correctly 100% of attempts Other Exercises: PMV trial by SLP see notes    Shoulder Instructions       General Comments BP taken in sitting with SCD on 135/105    Pertinent Vitals/ Pain       Pain Assessment Pain Assessment: No/denies pain (Simultaneous filing. User may not have seen previous data.)  Home Living                                          Prior Functioning/Environment              Frequency  Min 1X/week        Progress Toward Goals  OT Goals(current goals can now be found in the care plan section)  Progress towards OT goals: Progressing toward goals  Acute Rehab OT Goals Patient Stated Goal: unable to state OT Goal Formulation: Patient unable to participate in goal setting Time For Goal Achievement: 01/24/24 Potential to Achieve Goals: Fair ADL Goals Pt/caregiver will Perform Home Exercise Program: Increased ROM;Both right and left upper extremity Additional ADL Goal #1: Pt will follow 1 step commands with 50% accuracy in a nondistracting enviornment. Additional ADL  Goal #2: Pt will complete bed mobility with max assist +2, maintain sitting balance statically for 3 minutes with mod assist. Additional ADL Goal #3: Pt will demonstrates grasp and release with L UE x3 out 3 attempts  Plan      Co-evaluation    PT/OT/SLP Co-Evaluation/Treatment: Yes Reason for Co-Treatment: Complexity of the patient's impairments (multi-system involvement);Necessary to address cognition/behavior during functional activity (Simultaneous filing. User may not have seen previous data.)   OT goals addressed during session: ADL's and self-care      AM-PAC OT "6 Clicks" Daily Activity     Outcome Measure   Help from another person eating meals?: Total Help from another person taking care of personal grooming?: Total Help from another person toileting, which includes using toliet, bedpan, or urinal?: Total Help from another person bathing (including washing, rinsing, drying)?: Total Help from another person to put on and taking  off regular upper body clothing?: Total Help from another person to put on and taking off regular lower body clothing?: Total 6 Click Score: 6    End of Session Equipment Utilized During Treatment: Oxygen (28% trach collar)  OT Visit Diagnosis: Other abnormalities of gait and mobility (R26.89);Muscle weakness (generalized) (M62.81);Other symptoms and signs involving the nervous system (R29.898);Other symptoms and signs involving cognitive function;Hemiplegia and hemiparesis   Activity Tolerance Patient tolerated treatment well   Patient Left in bed;with call bell/phone within reach;with SCD's reapplied   Nurse Communication Mobility status;Precautions        Time: 1223 (1223)-1305 OT Time Calculation (min): 42 min  Charges: OT General Charges $OT Visit: 1 Visit OT Treatments $Self Care/Home Management : 38-52 mins   Brynn, OTR/L  Acute Rehabilitation Services Office: [phone] .   Neomia Banner 01/18/2024, 3:14 PM

## 2024-01-19 DIAGNOSIS — I615 Nontraumatic intracerebral hemorrhage, intraventricular: Secondary | ICD-10-CM | POA: Diagnosis not present

## 2024-01-19 DIAGNOSIS — Z930 Tracheostomy status: Secondary | ICD-10-CM | POA: Diagnosis not present

## 2024-01-19 DIAGNOSIS — I629 Nontraumatic intracranial hemorrhage, unspecified: Secondary | ICD-10-CM | POA: Diagnosis not present

## 2024-01-19 LAB — GLUCOSE, CAPILLARY
Glucose-Capillary: 107 mg/dL — ABNORMAL HIGH (ref 70–99)
Glucose-Capillary: 112 mg/dL — ABNORMAL HIGH (ref 70–99)
Glucose-Capillary: 132 mg/dL — ABNORMAL HIGH (ref 70–99)
Glucose-Capillary: 133 mg/dL — ABNORMAL HIGH (ref 70–99)
Glucose-Capillary: 164 mg/dL — ABNORMAL HIGH (ref 70–99)
Glucose-Capillary: 167 mg/dL — ABNORMAL HIGH (ref 70–99)
Glucose-Capillary: 80 mg/dL (ref 70–99)

## 2024-01-19 LAB — AMMONIA: Ammonia: 26 umol/L (ref 9–35)

## 2024-01-19 NOTE — Plan of Care (Signed)
  Problem: Education: Goal: Knowledge of disease or condition will improve Outcome: Not Progressing   Problem: Self-Care: Goal: Ability to participate in self-care as condition permits will improve Outcome: Not Progressing   Problem: Education: Goal: Ability to describe self-care measures that may prevent or decrease complications (Diabetes Survival Skills Education) will improve Outcome: Not Progressing   Problem: Coping: Goal: Ability to adjust to condition or change in health will improve Outcome: Not Progressing

## 2024-01-19 NOTE — Progress Notes (Signed)
 Patient's fiance [name]) agreed to shave this patient facial hair to not get inside his Trach.

## 2024-01-19 NOTE — Progress Notes (Signed)
 PROGRESS NOTE Garrett [name]  LKG:[id number] DOB: February 18, 1976 DOA: 10/27/2023 PCP: Pcp, No   Brief Narrative: 48 year old with no known previous medical history was found down on 10/27/2023, unresponsive with UDS positive for opioids.  He was minimally responsive to Narcan  and he was found to be markedly hypertensive-  blood pressure 262/156.  CT head notable for 4.1 cm intracranial hemorrhage at the pons with intraventricular extension into the fourth ventricle and basal cisterns.  Initially admitted by critical care, and now extubated with tracheostomy, initially placed on 3/20, replaced on 01/05/2024.  PEG tube placed 11/08/2023.  Remains with poor response.  Stable on trach and PEG.  Disposition remains difficult given patient's need for PEG and tracheostomy tube care.  Currently awaiting disability.  Significant hospital events: 3/8: Intubated in ED, pontine bleed/SAH. CT head Acute large hemorrhage 4.1 cm in pons with intraventricular extension without hydrocephalus. 3/9: CTA No change in IPH in pons, similar biparietal [facility] 3/14: Now awake, appears locked in syndrome.  Can follow commands with eyes. 3/16 Purposeful with left upper extremity  3/22 tracheostomy placed at bedside, bleeding issues overnight from trach site 3/24 PEG 3/24 sputum culture with MRSA 4/20: trach was changed to cuffless #8 5/1: Mental status appears to be somewhat improving off narcotics, more awake alert following simple commands(able to grimace, move left toes, track vertically with right eye).  Speech following. Trach downsized to [employer] cuffless #6 XLT 5/4: Modafinil  held, low threshold to resume if mental status/ability to interact diminishes 5/10: PEG dislodged, foley bulb placed and IR consulted to replace tube 5/11: IR replaced PEG tube 5/13 - 5/14: + Fever.  Restarted antibiotics.  Urine culture + E. Coli, foley catheter changed. 5/17 Tracheostomy changed.   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  CT head with acute large hemorrhage 4.1 cm in the pons, ventricular extension. blood pressure 260/1 50s on admission. - Initially admitted to the intensive care unit under PCCM service and neurology following. - 2D echo ejection fraction 60%, A1c 4.7, LDL 96. - Was also treated with 3% hypertonic saline.  Follow-up CT head 3/29 with reduction in size of hemorrhage. -Patient now with trach collar and not a candidate for decannulation as per PCCM. -Patient now has a PEG tube and tolerating well. Speech therapy, PT OT is following.  Acute respiratory failure secondary to large pontine stroke brainstem compression Unable to protect airway - Now has trach collar.  Mostly on room air or minimum oxygen.  PCCM intermittently following.  Recommended not a candidate for decannulation.  Hypertension: Continue amlodipine , metoprolol  - adding losartan  5/30 for continued elevated pressures on home regimen.  Stable today.  Transaminases: improving CT abdomen and pelvis 5/10: No focal liver abnormality seen, no gallstone, gallbladder wall thickening or biliary dilation - no significant medication that could cause transaminases. - acute viral hepatitis panel negative. - ammonia mildly elevated-treated with lactulose -ammonia is 26.   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  Multiple tracheal aspirate has been growing MRSA He was treated  with  vancomycin  and subsequently transition to Zyvox  which has been completed.  Aspiration precaution  E. coli UTI completed antibiotics course 5/18 Foley catheter was changed 5/13.  Moderate to severe protein caloric malnutrition, POA Body max index 31 Obesity type I PEG tube in place, continue tube feedings per RD  PEG tube malfunction, resolved PEG dislodged 5/10, replaced on 5/11 by IR   Exposure keratopathy, OS Seen by ophthalmology, Dr. Mason Sole on 4/7.  Treated with maxitrol ointment (neo-poly-dex) TID OS for 1 week. -- Refresh PM TID to QID OS for chronic lubrication -- Can tape eyelid closed if still exposure an issue (plastic tape may work better than paper and would try to tape eyelid directly rather than with gauze to keep enough tension to ensure it isn't remaining open)   AKI with acute urinary retention Currently has a Foley catheter. Prior removals/voiding trial unsuccessful -- Last catheter change was on 5/13.   Macrocytic anemia thrombocytosis  stable/resolved   Perineal skin injury, POA  Continue Gerhard's Butt cream   Relative B12 and folate deficiency Continue replacement with MVI    Goal of care: Currently DNR with intervention.   Nutrition Problem: Inadequate oral intake Etiology: inability to eat  Interventions: Tube feeding, Prostat, MVI, Juven  Estimated body mass index is 30.41 kg/m as calculated from the following:   Height as of this encounter: 5\' 8"  (1.727 m).   Weight as of this encounter: 90.7 kg.   DVT prophylaxis: Lovenox  Code Status: DNR-intervention Family Communication: none at bedside Disposition Plan:  Status is: Inpatient Remains inpatient appropriate because: Waiting for placement.  Consultants:  Neurology CCM Palliative Care General Surgery for PEG tube placement Ophthalmology, Dr. Mason Sole Interventional radiology for G-tube exchange 5/11  Procedures:  EEG 3/10 Percutaneous tracheostomy, Dr. Zaida Hertz 3/22 bronchoscopy 3/22; Dr.  Diania Fortes PEG tube placement, Dr. Hildy Lowers 3/24 PEG tube exchange, IR 5/11    Antimicrobials:  Ceftriaxone  5/13 - 5/18 Vancomycin  3/24 - 4/8, 5/14 - 5/16 Cefepime  5/14 - 5/15 Linezolid  4/9 - 4/15 Unasyn  3/23 - 3/24   Subjective: Patient seen and examined.  Startles on exam however looks alert but unable to interact.  No other overnight events.  Trach collar mostly on room air.  Objective: Vitals:   01/19/24 0453 01/19/24 0742 01/19/24 0804 01/19/24 1133  BP: (!) 125/93  (!) 130/98   Pulse: 85 87 88 79  Resp:  18 18 18   Temp: 98.7 F (37.1 C)  98.4 F (36.9 C)   TempSrc: Axillary  Axillary   SpO2: 94% 93% 98% 98%  Weight:      Height:        Intake/Output Summary (Last 24 hours) at 01/19/2024 1139 Last data filed at 01/19/2024 0302 Gross per 24 hour  Intake --  Output 720 ml  Net -720 ml   Filed Weights   01/11/24 0500 01/15/24 0500 01/16/24 1141  Weight: 93.4 kg 93.3 kg 90.7 kg   Examination: General exam: Appears calm and comfortable. Respiratory system: Clear to auscultation. Respiratory effort normal. Trach in place.  Looks clean and dry. Cardiovascular system: S1 & S2 heard, RRR.  Gastrointestinal system: Abdomen is nondistended, soft and nontender. No organomegaly or masses felt. Normal bowel sounds heard.  PEG tube in place.  Clean and dry. Foley catheter with clear urine. Central nervous system: Alert on stimulation.  Eyes are open.  Does not follow commands.  Nonverbal.  Data Reviewed: I have personally reviewed following labs and imaging studies  CBC: Recent Labs  Lab 01/14/24 0612 01/16/24 0549  WBC 8.5 8.4  HGB 11.8* 11.9*  HCT 37.5* 38.1*  MCV 92.8 93.6  PLT 519* 512*   Basic Metabolic Panel: Recent Labs  Lab 01/14/24 0612 01/16/24 0549 01/17/24 0523 01/18/24 1503  NA 137 135 136 135  K 3.6 3.9 4.2 3.9  CL 99 100 99 98  CO2 29 28 29 26   GLUCOSE 147* 122* 94 129*  BUN 24* 26* 24* 19  CREATININE 0.61 0.59* 0.54* 0.59*  CALCIUM 9.3 9.1  9.4 9.3  MG 2.0 2.1  --   --   PHOS 4.2 4.2  --   --    GFR: Estimated Creatinine Clearance: 124.8 mL/min (A) (by C-G formula based on SCr of 0.59 mg/dL (L)). Liver Function Tests: Recent Labs  Lab 01/14/24 0612 01/16/24 0549 01/17/24 0523  AST 68* 68* 57*  ALT 259* 257* 229*  ALKPHOS 60 65 63  BILITOT 0.5 0.6 0.3  PROT 7.1 7.2 7.1  ALBUMIN  2.9* 3.1* 3.0*   No results for input(s): "LIPASE", "AMYLASE" in the last 168 hours. Recent Labs  Lab 01/17/24 0523 01/18/24 1503 01/19/24 0430  AMMONIA 65* 38* 26   CBG: Recent Labs  Lab 01/18/24 1559 01/18/24 2111 01/19/24 0058 01/19/24 0456 01/19/24 0802  GLUCAP 133* 123* 133* 112* 167*    Scheduled Meds:  amLODipine   10 mg Per Tube Daily   artificial tears   Left Eye Q8H   Chlorhexidine  Gluconate Cloth  6 each Topical Daily   enoxaparin  (LOVENOX ) injection  40 mg Subcutaneous Daily   famotidine   20 mg Per Tube BID   feeding supplement (OSMOLITE 1.5 CAL)  237 mL Per Tube 5 X Daily   feeding supplement (PROSource TF20)  60 mL Per Tube TID   free water   150 mL Per Tube Q4H   insulin  aspart  0-15 Units Subcutaneous 5 times per day   losartan   25 mg Per Tube Daily   metoprolol  tartrate  100 mg Per Tube BID   multivitamin with minerals  1 tablet Per Tube Daily   mouth rinse  15 mL Mouth Rinse 4 times per day   Continuous Infusions:   LOS: 84 days    Time spent: 35 minutes    Vada Garibaldi, MD [facility]    01/19/2024, 11:39 AM

## 2024-01-20 ENCOUNTER — Inpatient Hospital Stay (HOSPITAL_COMMUNITY)

## 2024-01-20 DIAGNOSIS — Z930 Tracheostomy status: Secondary | ICD-10-CM | POA: Diagnosis not present

## 2024-01-20 DIAGNOSIS — I629 Nontraumatic intracranial hemorrhage, unspecified: Secondary | ICD-10-CM | POA: Diagnosis not present

## 2024-01-20 DIAGNOSIS — I615 Nontraumatic intracerebral hemorrhage, intraventricular: Secondary | ICD-10-CM | POA: Diagnosis not present

## 2024-01-20 LAB — RESP PANEL BY RT-PCR (RSV, FLU A&B, COVID)  RVPGX2
Influenza A by PCR: NEGATIVE
Influenza B by PCR: NEGATIVE
Resp Syncytial Virus by PCR: NEGATIVE
SARS Coronavirus 2 by RT PCR: NEGATIVE

## 2024-01-20 LAB — RESPIRATORY PANEL BY PCR

## 2024-01-20 LAB — CBC WITH DIFFERENTIAL/PLATELET
Abs Immature Granulocytes: 0.06 10*3/uL (ref 0.00–0.07)
Basophils Absolute: 0 10*3/uL (ref 0.0–0.1)
Basophils Relative: 0 %
Eosinophils Absolute: 0 10*3/uL (ref 0.0–0.5)
Eosinophils Relative: 0 %
HCT: 41.7 % (ref 39.0–52.0)
Hemoglobin: 13.2 g/dL (ref 13.0–17.0)
Immature Granulocytes: 0 %
Lymphocytes Relative: 12 %
Lymphs Abs: 1.8 10*3/uL (ref 0.7–4.0)
MCH: 28.9 pg (ref 26.0–34.0)
MCHC: 31.7 g/dL (ref 30.0–36.0)
MCV: 91.4 fL (ref 80.0–100.0)
Monocytes Absolute: 1.8 10*3/uL — ABNORMAL HIGH (ref 0.1–1.0)
Monocytes Relative: 13 %
Neutro Abs: 11.1 10*3/uL — ABNORMAL HIGH (ref 1.7–7.7)
Neutrophils Relative %: 75 %
Platelets: 534 10*3/uL — ABNORMAL HIGH (ref 150–400)
RBC: 4.56 MIL/uL (ref 4.22–5.81)
RDW: 14.5 % (ref 11.5–15.5)
WBC: 14.8 10*3/uL — ABNORMAL HIGH (ref 4.0–10.5)
nRBC: 0 % (ref 0.0–0.2)

## 2024-01-20 LAB — BASIC METABOLIC PANEL WITH GFR
Anion gap: 12 (ref 5–15)
BUN: 19 mg/dL (ref 6–20)
CO2: 25 mmol/L (ref 22–32)
Calcium: 9.1 mg/dL (ref 8.9–10.3)
Chloride: 98 mmol/L (ref 98–111)
Creatinine, Ser: 0.66 mg/dL (ref 0.61–1.24)
GFR, Estimated: >60 mL/min (ref >60)
Glucose, Bld: 122 mg/dL — ABNORMAL HIGH (ref 70–99)
Potassium: 4.2 mmol/L (ref 3.5–5.1)
Sodium: 135 mmol/L (ref 135–145)

## 2024-01-20 LAB — GLUCOSE, CAPILLARY
Glucose-Capillary: 135 mg/dL — ABNORMAL HIGH (ref 70–99)
Glucose-Capillary: 128 mg/dL — ABNORMAL HIGH (ref 70–99)
Glucose-Capillary: 102 mg/dL — ABNORMAL HIGH (ref 70–99)
Glucose-Capillary: 107 mg/dL — ABNORMAL HIGH (ref 70–99)
Glucose-Capillary: 139 mg/dL — ABNORMAL HIGH (ref 70–99)

## 2024-01-20 LAB — PROCALCITONIN: Procalcitonin: 0.16 ng/mL

## 2024-01-20 MED ORDER — VANCOMYCIN HCL 1.25 G IV SOLR
1250.0000 mg | INTRAVENOUS | Status: DC
  Administered 2024-01-20 – 2024-01-21 (×2): 1250 mg via INTRAVENOUS
  Filled 2024-01-20 (×3): qty 25

## 2024-01-20 MED ORDER — SODIUM CHLORIDE 0.9 % IV SOLN
2.0000 g | Freq: Three times a day (TID) | INTRAVENOUS | Status: DC
  Administered 2024-01-20 – 2024-01-24 (×11): 2 g via INTRAVENOUS
  Filled 2024-01-20 (×12): qty 12.5

## 2024-01-20 NOTE — Progress Notes (Signed)
   01/20/24 1800  Assess: MEWS Score  Temp 100.1 F (37.8 C)  BP (!) 166/115  MAP (mmHg) 128  Resp (!) 39  Level of Consciousness Alert  SpO2 98 %  O2 Device Tracheostomy Collar  Patient Activity (if Appropriate) In bed  Assess: if the MEWS score is Yellow or Red  Were vital signs accurate and taken at a resting state? Yes  Does the patient meet 2 or more of the SIRS criteria? Yes  Does the patient have a confirmed or suspected source of infection? No  MEWS guidelines implemented  Yes, red  Treat  MEWS Interventions Considered administering scheduled or prn medications/treatments as ordered  Take Vital Signs  Increase Vital Sign Frequency  Red: Q1hr x2, continue Q4hrs until patient remains green for 12hrs  Escalate  MEWS: Escalate Red: Discuss with charge nurse and notify provider. Consider notifying RRT. If remains red for 2 hours consider need for higher level of care  Notify: Charge Nurse/RN  Name of Charge Nurse/RN Notified Diplomatic Services operational officer  Provider Notification  Provider Name/Title Dr Letty Raya  Date Provider Notified 01/20/24  Time Provider Notified 1820  Method of Notification Page  Notification Reason Change in status;Other (Comment) (Red MEWS)  Provider response See new orders  Date of Provider Response 01/20/24  Time of Provider Response 1821  Assess: SIRS CRITERIA  SIRS Temperature  0  SIRS Respirations  1  SIRS Pulse 1  SIRS WBC 0  SIRS Score Sum  2

## 2024-01-20 NOTE — Plan of Care (Signed)
  Problem: Education: Goal: Knowledge of disease or condition will improve Outcome: Progressing Goal: Knowledge of secondary prevention will improve (MUST DOCUMENT ALL) Outcome: Progressing Goal: Knowledge of patient specific risk factors will improve (DELETE if not current risk factor) Outcome: Progressing   Problem: Intracerebral Hemorrhage Tissue Perfusion: Goal: Complications of Intracerebral Hemorrhage will be minimized Outcome: Progressing   Problem: Coping: Goal: Will verbalize positive feelings about self Outcome: Progressing Goal: Will identify appropriate support needs Outcome: Progressing   Problem: Health Behavior/Discharge Planning: Goal: Ability to manage health-related needs will improve Outcome: Progressing Goal: Goals will be collaboratively established with patient/family Outcome: Progressing   Problem: Self-Care: Goal: Ability to participate in self-care as condition permits will improve Outcome: Progressing Goal: Verbalization of feelings and concerns over difficulty with self-care will improve Outcome: Progressing Goal: Ability to communicate needs accurately will improve Outcome: Progressing   Problem: Nutrition: Goal: Risk of aspiration will decrease Outcome: Progressing Goal: Dietary intake will improve Outcome: Progressing   Problem: Education: Goal: Ability to describe self-care measures that may prevent or decrease complications (Diabetes Survival Skills Education) will improve Outcome: Progressing   Problem: Coping: Goal: Ability to adjust to condition or change in health will improve Outcome: Progressing   Problem: Fluid Volume: Goal: Ability to maintain a balanced intake and output will improve Outcome: Progressing   Problem: Health Behavior/Discharge Planning: Goal: Ability to identify and utilize available resources and services will improve Outcome: Progressing Goal: Ability to manage health-related needs will improve Outcome:  Progressing   Problem: Metabolic: Goal: Ability to maintain appropriate glucose levels will improve Outcome: Progressing   Problem: Nutritional: Goal: Maintenance of adequate nutrition will improve Outcome: Progressing Goal: Progress toward achieving an optimal weight will improve Outcome: Progressing   Problem: Skin Integrity: Goal: Risk for impaired skin integrity will decrease Outcome: Progressing   Problem: Tissue Perfusion: Goal: Adequacy of tissue perfusion will improve Outcome: Progressing   Problem: Education: Goal: Knowledge of General Education information will improve Description: Including pain rating scale, medication(s)/side effects and non-pharmacologic comfort measures Outcome: Progressing   Problem: Health Behavior/Discharge Planning: Goal: Ability to manage health-related needs will improve Outcome: Progressing   Problem: Clinical Measurements: Goal: Ability to maintain clinical measurements within normal limits will improve Outcome: Progressing Goal: Will remain free from infection Outcome: Progressing Goal: Diagnostic test results will improve Outcome: Progressing Goal: Respiratory complications will improve Outcome: Progressing Goal: Cardiovascular complication will be avoided Outcome: Progressing   Problem: Activity: Goal: Risk for activity intolerance will decrease Outcome: Progressing   Problem: Nutrition: Goal: Adequate nutrition will be maintained Outcome: Progressing   Problem: Coping: Goal: Level of anxiety will decrease Outcome: Progressing   Problem: Elimination: Goal: Will not experience complications related to bowel motility Outcome: Progressing Goal: Will not experience complications related to urinary retention Outcome: Progressing   Problem: Pain Managment: Goal: General experience of comfort will improve and/or be controlled Outcome: Progressing   Problem: Safety: Goal: Ability to remain free from injury will  improve Outcome: Progressing   Problem: Skin Integrity: Goal: Risk for impaired skin integrity will decrease Outcome: Progressing   Problem: Education: Goal: 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 Outcome: Progressing   Problem: Activity: Goal: Ability to tolerate increased activity will improve Outcome: Progressing   Problem: Health Behavior/Discharge Planning: Goal: Ability to manage tracheostomy will improve Outcome: Progressing   Problem: Respiratory: Goal: Patent airway maintenance will improve Outcome: Progressing   Problem: Role Relationship: Goal: Ability to communicate will improve Outcome: Progressing

## 2024-01-20 NOTE — Progress Notes (Signed)
 PROGRESS NOTE Garrett [name]  ZOX:[id number] DOB: 1976-07-03 DOA: 10/27/2023 PCP: Pcp, No   Brief Narrative: 48 year old with no known previous medical history was found down on 10/27/2023, unresponsive with UDS positive for opioids.  He was minimally responsive to Narcan  and he was found to be markedly hypertensive-  blood pressure 262/156.  CT head notable for 4.1 cm intracranial hemorrhage at the pons with intraventricular extension into the fourth ventricle and basal cisterns.  Initially admitted by critical care, and now extubated with tracheostomy, initially placed on 3/20, replaced on 01/05/2024.  PEG tube placed 11/08/2023.  Remains with poor response.  Stable on trach and PEG.  Disposition remains difficult given patient's need for PEG and tracheostomy tube care.  Currently awaiting disability.  Significant hospital events: 3/8: Intubated in ED, pontine bleed/SAH. CT head Acute large hemorrhage 4.1 cm in pons with intraventricular extension without hydrocephalus. 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  Can follow commands with eyes. 3/16 Purposeful with left upper extremity  3/22 tracheostomy placed at bedside, bleeding issues overnight from trach site 3/24 PEG 3/24 sputum culture with MRSA 4/20: trach was changed to cuffless #8 5/1: Mental status appears to be somewhat improving off narcotics, more awake alert following simple commands(able to grimace, move left toes, track vertically with right eye).  Speech following. Trach downsized to [employer] cuffless #6 XLT 5/4: Modafinil  held, low threshold to resume if mental status/ability to interact diminishes 5/10: PEG dislodged, foley bulb placed and IR consulted to replace tube 5/11: IR replaced PEG tube 5/13 - 5/14: + Fever.  Restarted antibiotics.  Urine culture + E. Coli, foley catheter changed. 5/17 Tracheostomy changed.   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  CT head with acute large hemorrhage 4.1 cm in the pons, ventricular extension. blood pressure 260/1 50s on admission. - Initially admitted to the intensive care unit under PCCM service and neurology following. - 2D echo ejection fraction 60%, A1c 4.7, LDL 96. - Was also treated with 3% hypertonic saline.  Follow-up CT head 3/29 with reduction in size of hemorrhage. -Patient now with trach collar and not a candidate for decannulation as per PCCM. -Patient now has a PEG tube and tolerating well. Speech therapy, PT OT is following.  Acute respiratory failure secondary to large pontine stroke brainstem compression Unable to protect airway - Now has trach collar.  Mostly on room air or minimum oxygen.  PCCM intermittently following.  Recommended not a candidate for decannulation.  Hypertension: Continue amlodipine , metoprolol  - adding losartan  5/30 for continued elevated pressures on home regimen.  Stable today.  Transaminases: improving CT abdomen and pelvis 5/10: No focal liver abnormality seen, no gallstone, gallbladder wall thickening or biliary dilation - no significant medication that could cause transaminases. - acute viral hepatitis panel negative. - ammonia mildly elevated-treated with lactulose -ammonia is 26.   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  Multiple tracheal aspirate has been growing MRSA He was treated  with  vancomycin  and subsequently transition to Zyvox  which has been completed.  Aspiration precaution  E. coli UTI completed antibiotics course 5/18 Foley catheter was changed 5/13.  Moderate to severe protein caloric malnutrition, POA Body max index 31 Obesity type I PEG tube in place, continue tube feedings per RD  PEG tube malfunction, resolved PEG dislodged 5/10, replaced on 5/11 by IR   Exposure keratopathy, OS Seen by ophthalmology, Dr. Mason Sole on 4/7.  Treated with maxitrol ointment (neo-poly-dex) TID OS for 1 week. -- Refresh PM TID to QID OS for chronic lubrication -- Can tape eyelid closed if still exposure an issue (plastic tape may work better than paper and would try to tape eyelid directly rather than with gauze to keep enough tension to ensure it isn't remaining open)   AKI with acute urinary retention Currently has a Foley catheter. Prior removals/voiding trial unsuccessful -- Last catheter change was on 5/13.   Macrocytic anemia thrombocytosis  stable/resolved   Perineal skin injury, POA  Continue Gerhard's Butt cream   Relative B12 and folate deficiency Continue replacement with MVI    Goal of care: Currently DNR with intervention.   Nutrition Problem: Inadequate oral intake Etiology: inability to eat  Interventions: Tube feeding, Prostat, MVI, Juven  Estimated body mass index is 33.6 kg/m as calculated from the following:   Height as of this encounter: 5\' 8"  (1.727 m).   Weight as of this encounter: 100.2 kg.   DVT prophylaxis: Lovenox  Code Status: DNR-intervention Family Communication: none at bedside Disposition Plan:  Status is: Inpatient Remains inpatient appropriate because: Waiting for placement.  Consultants:  Neurology CCM Palliative Care General Surgery for PEG tube placement Ophthalmology, Dr. Mason Sole Interventional radiology for G-tube exchange 5/11  Procedures:  EEG 3/10 Percutaneous tracheostomy, Dr. Zaida Hertz 3/22 bronchoscopy 3/22; Dr.  Diania Fortes PEG tube placement, Dr. Hildy Lowers 3/24 PEG tube exchange, IR 5/11    Antimicrobials:  Ceftriaxone  5/13 - 5/18 Vancomycin  3/24 - 4/8, 5/14 - 5/16 Cefepime  5/14 - 5/15 Linezolid  4/9 - 4/15 Unasyn  3/23 - 3/24   Subjective: Patient seen and examined.  No overnight events.  He blinks on the right eyes, left eye is covered with a patch.  On trach collar.  Objective: Vitals:   01/20/24 0454 01/20/24 0500 01/20/24 0810 01/20/24 0820  BP: (!) 136/97  (!) 120/93   Pulse: (!) 114  (!) 113 (!) 114  Resp: 18  18 18   Temp: 99.8 F (37.7 C)  99.3 F (37.4 C)   TempSrc: Oral  Oral   SpO2: 100%  99% 97%  Weight:  100.2 kg    Height:        Intake/Output Summary (Last 24 hours) at 01/20/2024 1107 Last data filed at 01/20/2024 0619 Gross per 24 hour  Intake --  Output 1750 ml  Net -1750 ml   Filed Weights   01/15/24 0500 01/16/24 1141 01/20/24 0500  Weight: 93.3 kg 90.7 kg 100.2 kg   Examination: General exam: Appears calm and comfortable. Respiratory system: Clear to auscultation. Respiratory effort normal. Trach in place.  Looks clean and dry. Cardiovascular system: S1 & S2 heard, RRR.  Gastrointestinal system: Abdomen is nondistended, soft and nontender. No organomegaly or masses felt. Normal bowel sounds heard.  PEG tube in place.  Clean and dry. Foley catheter with clear urine. Central nervous system: Alert on stimulation.  Eyes are open.  Does not follow commands.  Nonverbal.  Data Reviewed: I have personally reviewed following labs and imaging studies  CBC:  Recent Labs  Lab 01/14/24 0612 01/16/24 0549  WBC 8.5 8.4  HGB 11.8* 11.9*  HCT 37.5* 38.1*  MCV 92.8 93.6  PLT 519* 512*   Basic Metabolic Panel: Recent Labs  Lab 01/14/24 0612 01/16/24 0549 01/17/24 0523 01/18/24 1503  NA 137 135 136 135  K 3.6 3.9 4.2 3.9  CL 99 100 99 98  CO2 29 28 29 26   GLUCOSE 147* 122* 94 129*  BUN 24* 26* 24* 19  CREATININE 0.61 0.59* 0.54* 0.59*  CALCIUM 9.3 9.1 9.4 9.3   MG 2.0 2.1  --   --   PHOS 4.2 4.2  --   --    GFR: Estimated Creatinine Clearance: 130.9 mL/min (A) (by C-G formula based on SCr of 0.59 mg/dL (L)). Liver Function Tests: Recent Labs  Lab 01/14/24 0612 01/16/24 0549 01/17/24 0523  AST 68* 68* 57*  ALT 259* 257* 229*  ALKPHOS 60 65 63  BILITOT 0.5 0.6 0.3  PROT 7.1 7.2 7.1  ALBUMIN  2.9* 3.1* 3.0*   No results for input(s): "LIPASE", "AMYLASE" in the last 168 hours. Recent Labs  Lab 01/17/24 0523 01/18/24 1503 01/19/24 0430  AMMONIA 65* 38* 26   CBG: Recent Labs  Lab 01/19/24 1534 01/19/24 1744 01/19/24 2121 01/20/24 0611 01/20/24 0953  GLUCAP 80 132* 107* 135* 128*    Scheduled Meds:  amLODipine   10 mg Per Tube Daily   artificial tears   Left Eye Q8H   Chlorhexidine  Gluconate Cloth  6 each Topical Daily   enoxaparin  (LOVENOX ) injection  40 mg Subcutaneous Daily   famotidine   20 mg Per Tube BID   feeding supplement (OSMOLITE 1.5 CAL)  237 mL Per Tube 5 X Daily   feeding supplement (PROSource TF20)  60 mL Per Tube TID   free water   150 mL Per Tube Q4H   insulin  aspart  0-15 Units Subcutaneous 5 times per day   losartan   25 mg Per Tube Daily   metoprolol  tartrate  100 mg Per Tube BID   multivitamin with minerals  1 tablet Per Tube Daily   mouth rinse  15 mL Mouth Rinse 4 times per day   Continuous Infusions:   LOS: 85 days    Time spent: 35 minutes    Vada Garibaldi, MD [facility]    01/20/2024, 11:07 AM

## 2024-01-20 NOTE — Progress Notes (Signed)
 Patient has remained tachycardic during all day and now showing low  grade fever and tachypnea. On trach collar, slighly congested , mental status difficult to assess. Blood pressures elevated. .  Urine is crystal clear on catheter.   Plan: recent history of MRSA pneumonia . Check CBC, procalcitonin, electrolytes,cxr. RVP, covid, Sputum cultures and blood cultures. Will start patient on empiric antibiotics with vanc and cefepime  until source identification.

## 2024-01-20 NOTE — Progress Notes (Addendum)
 Pharmacy Antibiotic Note  Garrett [name] is a 48 y.o. male admitted on 10/27/2023 found down. S/p prolonged hospital course including trach/PEG placement,  Pharmacy has been consulted for vanc/cefepime  dosing with concern for new pneumonia due to tachycardia and tachypnea on trach collar.  Plan: Vancomycin  1250 mg IV q24h - previously therapeutic regimen Cefepime  2g q8h  F/u cultures, susceptibilities, and LOT   Height: 5\' 8"  (172.7 cm) Weight: 100.2 kg (221 lb) IBW/kg (Calculated) : 68.4  Temp (24hrs), Avg:99.3 F (37.4 C), Min:98.4 F (36.9 C), Max:100.1 F (37.8 C)  Recent Labs  Lab 01/14/24 0612 01/16/24 0549 01/17/24 0523 01/18/24 1503  WBC 8.5 8.4  --   --   CREATININE 0.61 0.59* 0.54* 0.59*    Estimated Creatinine Clearance: 130.9 mL/min (A) (by C-G formula based on SCr of 0.59 mg/dL (L)).    Allergies  Allergen Reactions   Tomato Hives, Itching and Rash    Antimicrobials this admission: Vanc/Cefepime  x1 3/8 Unasyn  3/9 > 3/14; 3/23 >3/24 Vanc 3/24 >> 3/31; 4/1 > 4/9; 5/14 > 16; 6/1 >> Cefepime  5/14 > 15; 6/1 >> Linezolid  4/9 > 4/16 Rocephin  5/13 >5/14  Microbiology results: 3/8 MRSA PCR - positive 3/8 BCx - neg UA/CXR negative 3/19 BAL 3/23: MRSA 4/1 TA: MRSA 4/8 TA: MRSA  5/13 BCx - NGTD 5/13 UCx - 100K E.coli 5/14 Resp cx ordered  Thank you for allowing pharmacy to be a part of this patient's care. Heddy Liverpool, PharmD PGY2 Critical Care Pharmacy Resident 01/20/2024 6:43 PM

## 2024-01-20 NOTE — Plan of Care (Signed)
  Problem: Education: Goal: Knowledge of disease or condition will improve Outcome: Not Progressing   Problem: Self-Care: Goal: Ability to participate in self-care as condition permits will improve Outcome: Not Progressing   Problem: Nutrition: Goal: Risk of aspiration will decrease Outcome: Not Progressing

## 2024-01-21 DIAGNOSIS — J9601 Acute respiratory failure with hypoxia: Secondary | ICD-10-CM | POA: Diagnosis not present

## 2024-01-21 DIAGNOSIS — I613 Nontraumatic intracerebral hemorrhage in brain stem: Secondary | ICD-10-CM | POA: Diagnosis not present

## 2024-01-21 LAB — GLUCOSE, CAPILLARY
Glucose-Capillary: 141 mg/dL — ABNORMAL HIGH (ref 70–99)
Glucose-Capillary: 178 mg/dL — ABNORMAL HIGH (ref 70–99)
Glucose-Capillary: 119 mg/dL — ABNORMAL HIGH (ref 70–99)
Glucose-Capillary: 118 mg/dL — ABNORMAL HIGH (ref 70–99)
Glucose-Capillary: 166 mg/dL — ABNORMAL HIGH (ref 70–99)

## 2024-01-21 LAB — CBC WITH DIFFERENTIAL/PLATELET
Abs Immature Granulocytes: 0.04 10*3/uL (ref 0.00–0.07)
Basophils Absolute: 0 10*3/uL (ref 0.0–0.1)
Basophils Relative: 0 %
Eosinophils Absolute: 0.1 10*3/uL (ref 0.0–0.5)
Eosinophils Relative: 0 %
HCT: 38.9 % — ABNORMAL LOW (ref 39.0–52.0)
Hemoglobin: 12.4 g/dL — ABNORMAL LOW (ref 13.0–17.0)
Immature Granulocytes: 0 %
Lymphocytes Relative: 17 %
Lymphs Abs: 2.4 10*3/uL (ref 0.7–4.0)
MCH: 29.1 pg (ref 26.0–34.0)
MCHC: 31.9 g/dL (ref 30.0–36.0)
MCV: 91.3 fL (ref 80.0–100.0)
Monocytes Absolute: 2.5 10*3/uL — ABNORMAL HIGH (ref 0.1–1.0)
Monocytes Relative: 17 %
Neutro Abs: 9.2 10*3/uL — ABNORMAL HIGH (ref 1.7–7.7)
Neutrophils Relative %: 66 %
Platelets: 488 10*3/uL — ABNORMAL HIGH (ref 150–400)
RBC: 4.26 MIL/uL (ref 4.22–5.81)
RDW: 14.7 % (ref 11.5–15.5)
WBC: 14.2 10*3/uL — ABNORMAL HIGH (ref 4.0–10.5)
nRBC: 0 % (ref 0.0–0.2)

## 2024-01-21 NOTE — Progress Notes (Signed)
 PROGRESS NOTE    Garrett [name]  ZOX:[id number] DOB: 1975/10/09 DOA: 10/27/2023 PCP: Pcp, No   Brief Narrative:  48 year old with no known previous medical history was found down on 10/27/2023, unresponsive with UDS positive for opioids.  He was minimally responsive to Narcan  and he was found to be markedly hypertensive-  blood pressure 262/156.  CT head notable for 4.1 cm intracranial hemorrhage at the pons with intraventricular extension into the fourth ventricle and basal cisterns.  Initially admitted by critical care, and now extubated with tracheostomy, initially placed on 3/20, replaced on 01/05/2024.  PEG tube placed 11/08/2023.   Remains with poor response.  Stable on trach and PEG.  Disposition remains difficult given patient's need for PEG and tracheostomy tube care.  Currently awaiting disability.   Significant hospital events: 3/8: Intubated in ED, pontine bleed/SAH. CT head Acute large hemorrhage 4.1 cm in pons with intraventricular extension without hydrocephalus. 3/9: CTA No change in IPH in pons, similar biparietal [facility] 3/14: Now awake, appears locked in syndrome.  Can follow commands with eyes. 3/16 Purposeful with left upper extremity  3/22 tracheostomy placed at bedside, bleeding issues overnight from trach site 3/24 PEG 3/24 sputum culture with MRSA 4/20: trach was changed to cuffless #8 5/1: Mental status appears to be somewhat improving off narcotics, more awake alert following simple commands(able to grimace, move left toes, track vertically with right eye).  Speech following. Trach downsized to [employer] cuffless #6 XLT 5/4: Modafinil  held, low threshold to resume if mental status/ability to interact diminishes 5/10: PEG dislodged, foley bulb placed and IR consulted to replace tube 5/11: IR replaced PEG tube 5/13 - 5/14: + Fever.  Restarted antibiotics.  Urine culture + E. Coli, foley catheter changed. 5/17 Tracheostomy changed.   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  CT head with acute large hemorrhage 4.1 cm in the pons, ventricular extension. blood pressure 260/1 50s on admission. - Initially admitted to the intensive care unit under PCCM service and neurology following. - 2D echo ejection fraction 60%, A1c 4.7, LDL 96. - Was also treated with 3% hypertonic saline.  Follow-up CT head 3/29 with reduction in size of hemorrhage. -Patient now with trach collar and not a candidate for decannulation as per PCCM. -Patient now has a PEG tube and tolerating well. Speech therapy, PT OT is following.   Acute respiratory failure secondary to large pontine stroke brainstem compression Unable to protect airway/possible aspiration pneumonia - Now has trach collar.   PCCM intermittently following.  Recommended not a candidate for decannulation.  On 01/20/2024, patient started being tachycardic and then tachypneic and developed fever of 101.5.  Chest x-ray was done which was unremarkable however he was felt to have aspiration pneumonia clinically and since he has history of MRSA pneumonia in the past, he was started on vancomycin  and cefepime  empirically.  Procalcitonin unremarkable however that does not rule out pneumonia.  Patient did have leukocytosis as well.  Repeating CBC today.  I do agree that clinically he does appear to have pneumonia and thus I will  continue dual antibiotics for 7 days.  Monitor closely.  Continue aspiration precautions.    Hypertension: Continue amlodipine , metoprolol , losartan  added on 01/18/2024.   Transaminases: improving CT abdomen and pelvis 5/10: No focal liver abnormality seen, no gallstone, gallbladder wall thickening or biliary dilation - no significant medication that could cause transaminases. - acute viral hepatitis panel negative. - ammonia mildly elevated-treated with lactulose -ammonia is 26.    E. coli UTI completed antibiotics course 5/18 Foley catheter was changed 5/13.   Moderate to severe protein caloric malnutrition, POA Body max index 31 Obesity type I PEG tube in place, continue tube feedings per RD   PEG tube malfunction, resolved PEG dislodged 5/10, replaced on 5/11 by IR   Exposure keratopathy, OS Seen by ophthalmology, Dr. Mason Sole on 4/7.  Treated with maxitrol ointment (neo-poly-dex) TID OS for 1 week. -- Refresh PM TID to QID OS for chronic lubrication -- Can tape eyelid closed if still exposure an issue (plastic tape may work better than paper and would try to tape eyelid directly rather than with gauze to keep enough tension to ensure it isn't remaining open)   AKI with acute urinary retention Currently has a Foley catheter. Prior removals/voiding trial unsuccessful -- Last catheter change was on 5/13.   Macrocytic anemia thrombocytosis  stable/resolved   Perineal skin injury, POA  Continue Gerhard's Butt cream   Relative B12 and folate deficiency Continue replacement with MVI    Goal of care: Currently DNR with intervention.   Nutrition Problem: Inadequate oral intake Etiology: inability to eat   Interventions: Tube feeding, Prostat, MVI, Juven   Estimated body mass index is 33.6 kg/m as calculated from the following:   Height as of this encounter: 5\' 8"  (1.727 m).   Weight as of this encounter: 100.2 kg.  DVT prophylaxis: enoxaparin  (LOVENOX ) injection 40 mg Start: 01/17/24 1000 SCDs Start: 10/27/23 2226   Code Status: Do not attempt resuscitation (DNR)  PRE-ARREST INTERVENTIONS DESIRED  Family Communication:  None present at bedside.  Plan of care discussed with patient in length and he/she verbalized understanding and agreed with it.  Status is: Inpatient Remains inpatient appropriate because: Awaiting placement   Estimated body mass index is 31.17 kg/m as calculated from the following:   Height as of this encounter: 5\' 8"  (1.727 m).   Weight as of this encounter: 93 kg.    Nutritional Assessment: Body mass index is 31.17 kg/m.Aaron Aas Seen by dietician.  I agree with the assessment and plan as outlined below: Nutrition Status: Nutrition Problem: Inadequate oral intake Etiology: inability to eat Signs/Symptoms: NPO status Interventions: Tube feeding, Prostat, MVI, Juven  . Skin Assessment: I have examined the patient's skin and I agree with the wound assessment as performed by the wound care RN as outlined below:    Consultants:  Ophthalmology, general surgery, palliative care, critical care  Procedures:  As above  Antimicrobials:  Anti-infectives (From admission, onward)    Start     Dose/Rate Route Frequency Ordered Stop   01/20/24 2000  ceFEPIme  (MAXIPIME ) 2 g in sodium chloride  0.9 % 100 mL IVPB        2 g 200 mL/hr over 30 Minutes Intravenous Every 8 hours 01/20/24 1900     01/20/24 2000  Vancomycin  (VANCOCIN ) 1,250 mg in sodium chloride  0.9 % 250 mL IVPB        1,250 mg 166.7 mL/hr over 90 Minutes Intravenous Every 24 hours 01/20/24 1900     01/03/24 1730  cefTRIAXone  (ROCEPHIN ) 1  g in sodium chloride  0.9 % 100 mL IVPB        1 g 200 mL/hr over 30 Minutes Intravenous Every 24 hours 01/03/24 1235 01/07/24 0710   01/02/24 1830  ceFEPIme  (MAXIPIME ) 2 g in sodium chloride  0.9 % 100 mL IVPB  Status:  Discontinued        2 g 200 mL/hr over 30 Minutes Intravenous Every 8 hours 01/02/24 1732 01/03/24 1235   01/02/24 1830  Vancomycin  (VANCOCIN ) 1,250 mg in sodium chloride  0.9 % 250 mL IVPB  Status:  Discontinued         1,250 mg 166.7 mL/hr over 90 Minutes Intravenous Every 24 hours 01/02/24 1732 01/04/24 1754   01/01/24 1200  cefTRIAXone  (ROCEPHIN ) 1 g in sodium chloride  0.9 % 100 mL IVPB  Status:  Discontinued        1 g 200 mL/hr over 30 Minutes Intravenous Every 24 hours 01/01/24 1057 01/02/24 1713   11/29/23 2200  linezolid  (ZYVOX ) tablet 600 mg        600 mg Per Tube Every 12 hours 11/29/23 1034 12/04/23 2215   11/28/23 1000  linezolid  (ZYVOX ) IVPB 600 mg  Status:  Discontinued        600 mg 300 mL/hr over 60 Minutes Intravenous Every 12 hours 11/28/23 0831 11/29/23 1034   11/23/23 1200  vancomycin  (VANCOREADY) IVPB 1250 mg/250 mL  Status:  Discontinued        1,250 mg 166.7 mL/hr over 90 Minutes Intravenous Every 24 hours 11/23/23 1036 11/28/23 0831   11/20/23 1100  vancomycin  (VANCOREADY) IVPB 1250 mg/250 mL  Status:  Discontinued        1,250 mg 166.7 mL/hr over 90 Minutes Intravenous Every 24 hours 11/20/23 0923 11/20/23 0923   11/20/23 1100  vancomycin  (VANCOREADY) IVPB 1250 mg/250 mL  Status:  Discontinued        1,250 mg 166.7 mL/hr over 90 Minutes Intravenous Every 24 hours 11/20/23 0923 11/23/23 1036   11/15/23 2300  vancomycin  (VANCOREADY) IVPB 1250 mg/250 mL        1,250 mg 166.7 mL/hr over 90 Minutes Intravenous Every 24 hours 11/15/23 2215 11/19/23 0924   11/12/23 2200  vancomycin  (VANCOREADY) IVPB 750 mg/150 mL  Status:  Discontinued        750 mg 150 mL/hr over 60 Minutes Intravenous Every 12 hours 11/12/23 0855 11/15/23 2215   11/12/23 0930  vancomycin  (VANCOREADY) IVPB 2000 mg/400 mL        2,000 mg 200 mL/hr over 120 Minutes Intravenous  Once 11/12/23 0838 11/12/23 1116   11/11/23 1345  Ampicillin -Sulbactam (UNASYN ) 3 g in sodium chloride  0.9 % 100 mL IVPB  Status:  Discontinued        3 g 200 mL/hr over 30 Minutes Intravenous Every 6 hours 11/11/23 1257 11/12/23 0836   10/28/23 0400  Ampicillin -Sulbactam (UNASYN ) 3 g in sodium chloride  0.9 % 100 mL IVPB  Status:  Discontinued         3 g 200 mL/hr over 30 Minutes Intravenous Every 6 hours 10/27/23 2151 11/02/23 0945   10/27/23 1845  vancomycin  (VANCOCIN ) IVPB 1000 mg/200 mL premix  Status:  Discontinued        1,000 mg 200 mL/hr over 60 Minutes Intravenous  Once 10/27/23 1843 10/27/23 1843   10/27/23 1845  ceFEPIme  (MAXIPIME ) 2 g in sodium chloride  0.9 % 100 mL IVPB        2 g 200 mL/hr over 30 Minutes Intravenous  Once 10/27/23 1843 10/27/23 2024  10/27/23 1845  vancomycin  (VANCOREADY) IVPB 2000 mg/400 mL        2,000 mg 200 mL/hr over 120 Minutes Intravenous  Once 10/27/23 1843 10/27/23 2123         Subjective: Seen and examined.  Nonverbal.  Appears comfortable but has a lot of sweating on the face.  Slightly tachypneic.  Objective: Vitals:   01/21/24 0444 01/21/24 0500 01/21/24 0812 01/21/24 0849  BP: 113/80  (!) 134/99   Pulse: (!) 105  (!) 113 (!) 112  Resp: (!) 24  (!) 22 20  Temp: 98.9 F (37.2 C)  99 F (37.2 C)   TempSrc: Axillary  Axillary   SpO2: 96%  98% 97%  Weight:  93 kg    Height:        Intake/Output Summary (Last 24 hours) at 01/21/2024 0903 Last data filed at 01/20/2024 2325 Gross per 24 hour  Intake 737 ml  Output 750 ml  Net -13 ml   Filed Weights   01/16/24 1141 01/20/24 0500 01/21/24 0500  Weight: 90.7 kg 100.2 kg 93 kg    Examination:  General exam: Appears calm and comfortable but tachypneic. Respiratory system: Slightly tachypneic, questionable rhonchi bilaterally.  Has trach collar. Cardiovascular system: S1 & S2 heard, sinus tachycardia. No JVD, murmurs, rubs, gallops or clicks. No pedal edema. Gastrointestinal system: Abdomen is nondistended, soft and nontender. No organomegaly or masses felt. Normal bowel sounds heard. Central nervous system: Alert but unable to assess orientation due to being nonverbal.  Not following commands.  Data Reviewed: I have personally reviewed following labs and imaging studies  CBC: Recent Labs  Lab 01/16/24 0549  01/20/24 2032  WBC 8.4 14.8*  NEUTROABS  --  11.1*  HGB 11.9* 13.2  HCT 38.1* 41.7  MCV 93.6 91.4  PLT 512* 534*   Basic Metabolic Panel: Recent Labs  Lab 01/16/24 0549 01/17/24 0523 01/18/24 1503 01/20/24 2032  NA 135 136 135 135  K 3.9 4.2 3.9 4.2  CL 100 99 98 98  CO2 28 29 26 25   GLUCOSE 122* 94 129* 122*  BUN 26* 24* 19 19  CREATININE 0.59* 0.54* 0.59* 0.66  CALCIUM 9.1 9.4 9.3 9.1  MG 2.1  --   --   --   PHOS 4.2  --   --   --    GFR: Estimated Creatinine Clearance: 126.3 mL/min (by C-G formula based on SCr of 0.66 mg/dL). Liver Function Tests: Recent Labs  Lab 01/16/24 0549 01/17/24 0523  AST 68* 57*  ALT 257* 229*  ALKPHOS 65 63  BILITOT 0.6 0.3  PROT 7.2 7.1  ALBUMIN  3.1* 3.0*   No results for input(s): "LIPASE", "AMYLASE" in the last 168 hours. Recent Labs  Lab 01/17/24 0523 01/18/24 1503 01/19/24 0430  AMMONIA 65* 38* 26   Coagulation Profile: No results for input(s): "INR", "PROTIME" in the last 168 hours. Cardiac Enzymes: No results for input(s): "CKTOTAL", "CKMB", "CKMBINDEX", "TROPONINI" in the last 168 hours. BNP (last 3 results) No results for input(s): "PROBNP" in the last 8760 hours. HbA1C: No results for input(s): "HGBA1C" in the last 72 hours. CBG: Recent Labs  Lab 01/20/24 0953 01/20/24 1542 01/20/24 1759 01/20/24 2209 01/21/24 0533  GLUCAP 128* 102* 107* 139* 141*   Lipid Profile: No results for input(s): "CHOL", "HDL", "LDLCALC", "TRIG", "CHOLHDL", "LDLDIRECT" in the last 72 hours. Thyroid Function Tests: No results for input(s): "TSH", "T4TOTAL", "FREET4", "T3FREE", "THYROIDAB" in the last 72 hours. Anemia Panel: No results for input(s): "VITAMINB12", "  FOLATE", "FERRITIN", "TIBC", "IRON", "RETICCTPCT" in the last 72 hours. Sepsis Labs: Recent Labs  Lab 01/20/24 2032  PROCALCITON 0.16    Recent Results (from the past 240 hours)  Respiratory (~20 pathogens) panel by PCR     Status: None   Collection Time:  01/20/24  8:40 PM   Specimen: Nasopharyngeal Swab; Respiratory  Result Value Ref Range Status   Adenovirus NOT DETECTED NOT DETECTED Final   Coronavirus 229E NOT DETECTED NOT DETECTED Final    Comment: (NOTE) The Coronavirus on the Respiratory Panel, DOES NOT test for the novel  Coronavirus (2019 nCoV)    Coronavirus HKU1 NOT DETECTED NOT DETECTED Final   Coronavirus NL63 NOT DETECTED NOT DETECTED Final   Coronavirus OC43 NOT DETECTED NOT DETECTED Final   Metapneumovirus NOT DETECTED NOT DETECTED Final   Rhinovirus / Enterovirus NOT DETECTED NOT DETECTED Final   Influenza A NOT DETECTED NOT DETECTED Final   Influenza B NOT DETECTED NOT DETECTED Final   Parainfluenza Virus 1 NOT DETECTED NOT DETECTED Final   Parainfluenza Virus 2 NOT DETECTED NOT DETECTED Final   Parainfluenza Virus 3 NOT DETECTED NOT DETECTED Final   Parainfluenza Virus 4 NOT DETECTED NOT DETECTED Final   Respiratory Syncytial Virus NOT DETECTED NOT DETECTED Final   Bordetella pertussis NOT DETECTED NOT DETECTED Final   Bordetella Parapertussis NOT DETECTED NOT DETECTED Final   Chlamydophila pneumoniae NOT DETECTED NOT DETECTED Final   Mycoplasma pneumoniae NOT DETECTED NOT DETECTED Final    Comment: Performed at [facility], [address] [place], [name] [address]  Resp panel by RT-PCR (RSV, Flu A&B, Covid) Anterior Nasal Swab     Status: None   Collection Time: 01/20/24  8:40 PM   Specimen: Anterior Nasal Swab  Result Value Ref Range Status   SARS Coronavirus 2 by RT PCR NEGATIVE NEGATIVE Final   Influenza A by PCR NEGATIVE NEGATIVE Final   Influenza B by PCR NEGATIVE NEGATIVE Final    Comment: (NOTE) The Xpert Xpress SARS-CoV-2/FLU/RSV plus assay is intended as an aid in the diagnosis of influenza from Nasopharyngeal swab specimens and should not be used as a sole basis for treatment. Nasal washings and aspirates are unacceptable for Xpert Xpress SARS-CoV-2/FLU/RSV testing.  Fact Sheet for  Patients: BloggerCourse.com  Fact Sheet for Healthcare Providers: SeriousBroker.it  This test is not yet approved or cleared by the [place]  FDA and has been authorized for detection and/or diagnosis of SARS-CoV-2 by FDA under an Emergency Use Authorization (EUA). This EUA will remain in effect (meaning this test can be used) for the duration of the COVID-19 declaration under Section 564(b)(1) of the Act, 21 U.S.C. section 360bbb-3(b)(1), unless the authorization is terminated or revoked.     Resp Syncytial Virus by PCR NEGATIVE NEGATIVE Final    Comment: (NOTE) Fact Sheet for Patients: BloggerCourse.com  Fact Sheet for Healthcare Providers: SeriousBroker.it  This test is not yet approved or cleared by the [place]  FDA and has been authorized for detection and/or diagnosis of SARS-CoV-2 by FDA under an Emergency Use Authorization (EUA). This EUA will remain in effect (meaning this test can be used) for the duration of the COVID-19 declaration under Section 564(b)(1) of the Act, 21 U.S.C. section 360bbb-3(b)(1), unless the authorization is terminated or revoked.  Performed at [facility], [address], [place], [name] [address]   Culture, Respiratory w Gram Stain     Status: None (Preliminary result)   Collection Time: 01/21/24  5:55 AM  Specimen: Tracheal Aspirate  Result Value Ref Range Status   Specimen Description TRACHEAL ASPIRATE  Final   Special Requests NONE  Final   Gram Stain   Final    FEW WBC PRESENT, PREDOMINANTLY PMN FEW GRAM NEGATIVE RODS FEW GRAM POSITIVE COCCI RARE GRAM POSITIVE RODS Performed at [facility], [address], [place], [name] [address]    Culture PENDING  Incomplete   Report Status PENDING  Incomplete     Radiology Studies: DG CHEST PORT 1 VIEW Result Date: 01/20/2024 CLINICAL DATA:  Pneumonia EXAM: PORTABLE  CHEST 1 VIEW COMPARISON:  Chest x-ray 12/22/2023 FINDINGS: The tip of the tracheostomy is at the level of the clavicular heads. The heart is enlarged. The lungs are clear. There is no pleural effusion or pneumothorax. No acute fractures are seen. IMPRESSION: Cardiomegaly. No acute cardiopulmonary process. Electronically Signed   By: Tyron Gallon M.D.   On: 01/20/2024 20:26    Scheduled Meds:  amLODipine   10 mg Per Tube Daily   artificial tears   Left Eye Q8H   Chlorhexidine  Gluconate Cloth  6 each Topical Daily   enoxaparin  (LOVENOX ) injection  40 mg Subcutaneous Daily   famotidine   20 mg Per Tube BID   feeding supplement (OSMOLITE 1.5 CAL)  237 mL Per Tube 5 X Daily   feeding supplement (PROSource TF20)  60 mL Per Tube TID   free water   150 mL Per Tube Q4H   insulin  aspart  0-15 Units Subcutaneous 5 times per day   losartan   25 mg Per Tube Daily   metoprolol  tartrate  100 mg Per Tube BID   multivitamin with minerals  1 tablet Per Tube Daily   mouth rinse  15 mL Mouth Rinse 4 times per day   Continuous Infusions:  ceFEPime  (MAXIPIME ) IV 2 g (01/21/24 0330)   vancomycin  Stopped (01/20/24 2245)     LOS: 86 days   Modena Andes, MD [facility]  01/21/2024, 9:03 AM   *Please note that this is a verbal dictation therefore any spelling or grammatical errors are due to the "Dragon Medical One" system interpretation.  Please page via Amion and do not message via secure chat for urgent patient care matters. Secure chat can be used for non urgent patient care matters.  How to contact the [facility] Attending or Consulting provider 7A - 7P or covering provider during after hours 7P -7A, for this patient?  Check the care team in [facility] and look for a) attending/consulting [facility] provider listed and b) the [facility] team listed. Page or secure chat 7A-7P. Log into [email or web address] and use [facility] universal password to access. If you do not have the password, please contact the hospital operator. Locate  the [facility] provider you are looking for under [facility] and page to a number that you can be directly reached. If you still have difficulty reaching the provider, please page the [name] (Director on Call) for the Hospitalists listed on amion for assistance.

## 2024-01-21 NOTE — Plan of Care (Signed)
  Problem: Education: Goal: Knowledge of disease or condition will improve Outcome: Not Progressing   Problem: Coping: Goal: Will verbalize positive feelings about self Outcome: Not Progressing   Problem: Self-Care: Goal: Ability to participate in self-care as condition permits will improve Outcome: Not Progressing

## 2024-01-21 NOTE — Progress Notes (Signed)
 NAME:  Garrett [name], MRN:  [id number], DOB:  [birth year], LOS: 86 ADMISSION DATE:  10/27/2023, CONSULTATION DATE:  10/27/2023 REFERRING MD:  Florentino Hurdle, DO CHIEF COMPLAINT:  Found Down  History of Present Illness:  48 y/o male with past medical history of hypertension who was found down for unknown downtime by his fiance when she came home from work. Reportedly, having agonal breathing.  He apparently had driven her to work this morning.  He has HTN but never checks his BP and does not follow with MD.  She says you can tell when his BP is really high; his eyes get blood shot and he is sweating.  No h/o seizures.  Besides almost daily [name] and cigarette smoking, she is not aware of any other drugs. Drug screen positive for opioids and he was given several doses of Narcan . Patient found to have:  1. 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 Basal cisterns and fourth ventricle are effaced without hydrocephalus at this time. 2. Trace additional subarachnoid hemorrhage along the left parietal convexity. BP in ED 262/156. His CXR revealed a RUL and perihilar infiltrates suggesting aspiration pneumonia. ED labs also revealed PH 7.169, LA 4.4, CPK 985. Patient was intubated in ED.  Pertinent  Medical History  hypertension  Significant Hospital Events: Including procedures, antibiotic start and stop dates in addition to other pertinent events   3/8: Intubated in ED, pontine bleed/SAH. CT head 17:09 Acute large hemorrhage 4.1 cm in pons with intraventricular extension without hydrocephalus 3/9: CTA 03:14 AM No change in IPH in pons, similar biparietal [facility] 3/14: Now awake, appears locked in can follow commands with eyes. 3/16 Purposeful with left upper extremity  3/22 tracheostomy placed at bedside, bleeding issues overnight from trach site 3/24 PEG (Dr. Hildy Lowers) 3/24  sputum culture with MRSA 3/27 vomited. TF on hold. Abd film c/w ileus. Added reglan , got SSE. Did tolerate PSV all day  3/28 BMs x2 after SSE day prior. Added back TFs at 1/2 rated. Tolerated PS 3/29 Tolerated PS  3/31 Tolerating CPAP PS 15/5, TF on hold due to ileus. 4/1: con't to hold tube feeds today, restarting vancomycin  and sending tracheal aspirate as peaks are 37, plat 24, driving 19. Thick secretions. Fever overnight.  4/2: peaks improved. Ongoing hiccups. Trickle feeding. Neuro exam unchanged.  4/20: trach was changed to cuffless #8 4/28: not safe to swallow. Limited communication and he follows simple commands. On TF 5/1: on TC 28%, with large secretions. Working with speech and he is improving to initiate PMV trials under ST supervision   5/5: on TC 30%, 7 L/min. Trach #6 cuffless, changed 5/1 to facilitate PMV trials  5/12: on TC 21%, 6 L/min. Trach #6 cuffless, unable to produce sounds or speak on PMV. No significant resp secretions. On PEG TF  Interim History / Subjective:   Has been doing well on trach collar  Objective   Blood pressure (!) 134/99, pulse (!) 112, temperature 99 F (37.2 C), temperature source Axillary, resp. rate 20, height 5\' 8"  (1.727 m), weight 93 kg, SpO2 97%.    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

## 2024-01-22 DIAGNOSIS — I613 Nontraumatic intracerebral hemorrhage in brain stem: Secondary | ICD-10-CM | POA: Diagnosis not present

## 2024-01-22 LAB — GLUCOSE, CAPILLARY
Glucose-Capillary: 110 mg/dL — ABNORMAL HIGH (ref 70–99)
Glucose-Capillary: 199 mg/dL — ABNORMAL HIGH (ref 70–99)
Glucose-Capillary: 129 mg/dL — ABNORMAL HIGH (ref 70–99)
Glucose-Capillary: 117 mg/dL — ABNORMAL HIGH (ref 70–99)
Glucose-Capillary: 153 mg/dL — ABNORMAL HIGH (ref 70–99)

## 2024-01-22 MED ORDER — VANCOMYCIN HCL 1.25 G IV SOLR: 1250.0000 mg | INTRAVENOUS | Status: DC

## 2024-01-22 MED ORDER — VANCOMYCIN HCL 1250 MG/250ML IV SOLN
1250.0000 mg | INTRAVENOUS | Status: DC
  Administered 2024-01-22 – 2024-01-23 (×2): 1250 mg via INTRAVENOUS
  Filled 2024-01-22 (×2): qty 250

## 2024-01-22 NOTE — Plan of Care (Signed)
  Problem: Nutrition: Goal: Risk of aspiration will decrease Outcome: Progressing Goal: Dietary intake will improve Outcome: Progressing   

## 2024-01-22 NOTE — Progress Notes (Signed)
 Physical Therapy Treatment Patient Details Name: Garrett [name] MRN: [id number] DOB: Sep 01, 1975 Today's Date: 01/22/2024   History of Present Illness Pt is a 48 yo male who was found down 10/27/23 with agonal breathing. 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 Intubated 3/8, cortrak placed 3/14, trach placed 3/22, PEG placed 3/24. PMH: HTN, smoker    PT Comments  Patient progressing with ability to nod his head in response to questions and moving L hand volitionally to wave and to grip to command with increased time.  Patient tolerated EOB mobility today to work on upright tolerance, trunk extension and improve respirations/secretion clearance.  Patient appropriate for LTACH prior to d/c home.    If plan is discharge home, recommend the following: Two people to help with walking and/or transfers;Two people to help with bathing/dressing/bathroom;Assistance with cooking/housework;Assistance with feeding;Direct supervision/assist for medications management;Direct supervision/assist for financial management;Assist for transportation;Help with stairs or ramp for entrance;Supervision due to cognitive status   Can travel by private vehicle        Equipment Recommendations  [place] lift;Hospital bed;Wheelchair (measurements PT);Wheelchair cushion (measurements PT);BSC/3in1;Other (comment)    Recommendations for Other Services       Precautions / Restrictions Precautions Precautions: Fall Recall of Precautions/Restrictions: Impaired Precaution/Restrictions Comments: trach, PEG, abdominal binder, SBP < 160 Splint/Cast: Bil resting hand splints Other Brace: has bilateral prevalon boots     Mobility  Bed Mobility Overal bed mobility: Needs Assistance Bed Mobility: Supine to Sit, Sit to Supine   Sidelying to sit: Total assist,  +2 for physical assistance, +2 for safety/equipment, HOB elevated   Sit to supine: Total assist, +2 for physical assistance   General bed mobility comments: moving legs to EOB and lifting trunk; to supine A for trunk and legs and scooting up in bed/positioning for proper posture/respirations and trunk control    Transfers                        Ambulation/Gait                   Stairs             Wheelchair Mobility     Tilt Bed    Modified Rankin (Stroke Patients Only) Modified Rankin (Stroke Patients Only) Pre-Morbid Rankin Score: No symptoms Modified Rankin: Severe disability     Balance Overall balance assessment: Needs assistance Sitting-balance support: Feet supported, No upper extremity supported Sitting balance-Leahy Scale: Zero Sitting balance - Comments: max to total  A for sitting balance working on lifting head, cervical ROM, moving arms, anterior weight shift and recovery. on EOB about 10-12 minutes                                    Communication Communication Factors Affecting Communication: Trach/intubated  Cognition Arousal: Alert Behavior During Therapy: Flat affect   PT - Cognitive impairments: Difficult to assess Difficult to assess due to: Tracheostomy, Impaired communication                     PT - Cognition Comments: able to nod his head "yes" and spouse arrived end of session stating he said his teeth hurt when RN was in last night; more accurate with head nod than with "yes"/"no"  board due to vertical nystagmus Following commands: Impaired Following commands impaired: Follows one step commands with increased time    Cueing Cueing Techniques: Verbal cues  Exercises Other Exercises Other Exercises: supine stretching to heel cords, hamstrings, hip rotators, hip extensors and hip adductors x 10 sec holds on each leg Other Exercises: UE shoulder flexion, elbow flex/ext and wrist and hand flex/ext  (noted L hand with active movement able to lift hand to wave "bye" with cue and increased time able to grip weakly    General Comments General comments (skin integrity, edema, etc.): significant other arrived end of session, discussed coordinating around her time here next session around 4pm if possible for caregiver education on LE stretches      Pertinent Vitals/Pain Pain Assessment Pain Assessment: Faces Faces Pain Scale: Hurts little more Pain Location: grimacing at times with LE stretching Pain Descriptors / Indicators: Grimacing Pain Intervention(s): Limited activity within patient's tolerance, Monitored during session    Home Living                          Prior Function            PT Goals (current goals can now be found in the care plan section) Progress towards PT goals: Progressing toward goals    Frequency    Min 2X/week      PT Plan      Co-evaluation              AM-PAC PT "6 Clicks" Mobility   Outcome Measure  Help needed turning from your back to your side while in a flat bed without using bedrails?: Total Help needed moving from lying on your back to sitting on the side of a flat bed without using bedrails?: Total Help needed moving to and from a bed to a chair (including a wheelchair)?: Total Help needed standing up from a chair using your arms (e.g., wheelchair or bedside chair)?: Total Help needed to walk in hospital room?: Total Help needed climbing 3-5 steps with a railing? : Total 6 Click Score: 6    End of Session   Activity Tolerance: Patient tolerated treatment well Patient left: in bed;with family/visitor present   PT Visit Diagnosis: Muscle weakness (generalized) (M62.81);Other symptoms and signs involving the nervous system (R29.898);Hemiplegia and hemiparesis Hemiplegia - Right/Left: Right Hemiplegia - dominant/non-dominant: Dominant Hemiplegia - caused by: Nontraumatic intracerebral hemorrhage     Time:  1455-1525 PT Time Calculation (min) (ACUTE ONLY): 30 min  Charges:    $Therapeutic Activity: 23-37 mins PT General Charges $$ ACUTE PT VISIT: 1 Visit                     Abigail Hoff, PT Acute Rehabilitation Services [phone] 01/22/2024    Garrett [name] 01/22/2024, 5:37 PM

## 2024-01-22 NOTE — Progress Notes (Signed)
 PROGRESS NOTE    Garrett [name]  HYQ:[id number] DOB: 1975-10-17 DOA: 10/27/2023 PCP: Pcp, No   Brief Narrative:  48 year old with no known previous medical history was found down on 10/27/2023, unresponsive with UDS positive for opioids.  He was minimally responsive to Narcan  and he was found to be markedly hypertensive-  blood pressure 262/156.  CT head notable for 4.1 cm intracranial hemorrhage at the pons with intraventricular extension into the fourth ventricle and basal cisterns.  Initially admitted by critical care, and now extubated with tracheostomy, initially placed on 3/20, replaced on 01/05/2024.  PEG tube placed 11/08/2023.   Remains with poor response.  Stable on trach and PEG.  Disposition remains difficult given patient's need for PEG and tracheostomy tube care.  Currently awaiting disability.   Significant hospital events: 3/8: Intubated in ED, pontine bleed/SAH. CT head Acute large hemorrhage 4.1 cm in pons with intraventricular extension without hydrocephalus. 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  Can follow commands with eyes. 3/16 Purposeful with left upper extremity  3/22 tracheostomy placed at bedside, bleeding issues overnight from trach site 3/24 PEG 3/24 sputum culture with MRSA 4/20: trach was changed to cuffless #8 5/1: Mental status appears to be somewhat improving off narcotics, more awake alert following simple commands(able to grimace, move left toes, track vertically with right eye).  Speech following. Trach downsized to [employer] cuffless #6 XLT 5/4: Modafinil  held, low threshold to resume if mental status/ability to interact diminishes 5/10: PEG dislodged, foley bulb placed and IR consulted to replace tube 5/11: IR replaced PEG tube 5/13 - 5/14: + Fever.  Restarted antibiotics.  Urine culture + E. Coli, foley catheter changed. 5/17 Tracheostomy changed.   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  CT head with acute large hemorrhage 4.1 cm in the pons, ventricular extension. blood pressure 260/1 50s on admission. - Initially admitted to the intensive care unit under PCCM service and neurology following. - 2D echo ejection fraction 60%, A1c 4.7, LDL 96. - Was also treated with 3% hypertonic saline.  Follow-up CT head 3/29 with reduction in size of hemorrhage. -Patient now with trach collar and not a candidate for decannulation as per PCCM. -Patient now has a PEG tube and tolerating well. Speech therapy, PT OT is following.   Acute respiratory failure secondary to large pontine stroke brainstem compression Unable to protect airway/possible aspiration pneumonia - Now has trach collar.   PCCM intermittently following.  Recommended not a candidate for decannulation.  On 01/20/2024, patient started being tachycardic and then tachypneic and developed fever of 101.5.  Chest x-ray was done which was unremarkable however he was felt to have aspiration pneumonia clinically and since he has history of MRSA pneumonia in the past, he was started on vancomycin  and cefepime  empirically.  Procalcitonin unremarkable however that does not rule out pneumonia.  Patient did have leukocytosis as well.  I do agree that clinically he does appear to have pneumonia and thus I will continue dual antibiotics for  7 days.  Monitor closely.  Continue aspiration precautions.  Last temperature spike of 100.5  at 3 PM on 01/21/2024.   Hypertension: Continue amlodipine , metoprolol , losartan  added on 01/18/2024.  Diastolic is still elevated at times.  Monitor further 24 hours.   Transaminases: improving CT abdomen and pelvis 5/10: No focal liver abnormality seen, no gallstone, gallbladder wall thickening or biliary dilation - no significant medication that could cause transaminases. - acute viral hepatitis panel negative. - ammonia mildly elevated-treated with lactulose -ammonia is 26.    E. coli UTI completed antibiotics course 5/18 Foley catheter was changed 5/13.   Moderate to severe protein caloric malnutrition, POA Body max index 31 Obesity type I PEG tube in place, continue tube feedings per RD   PEG tube malfunction, resolved PEG dislodged 5/10, replaced on 5/11 by IR   Exposure keratopathy, OS Seen by ophthalmology, Dr. Mason Sole on 4/7.  Treated with maxitrol ointment (neo-poly-dex) TID OS for 1 week. -- Refresh PM TID to QID OS for chronic lubrication -- Can tape eyelid closed if still exposure an issue (plastic tape may work better than paper and would try to tape eyelid directly rather than with gauze to keep enough tension to ensure it isn't remaining open)   AKI with acute urinary retention Currently has a Foley catheter. Prior removals/voiding trial unsuccessful -- Last catheter change was on 5/13.   Macrocytic anemia thrombocytosis  stable/resolved   Perineal skin injury, POA  Continue Gerhard's Butt cream   Relative B12 and folate deficiency Continue replacement with MVI    Goal of care: Currently DNR with intervention.   Nutrition Problem: Inadequate oral intake Etiology: inability to eat   Interventions: Tube feeding, Prostat, MVI, Juven   Estimated body mass index is 33.6 kg/m as calculated from the following:   Height as of this encounter: 5\' 8"  (1.727 m).   Weight as of this encounter: 100.2 kg.  DVT prophylaxis: enoxaparin  (LOVENOX ) injection 40 mg Start:  01/17/24 1000 SCDs Start: 10/27/23 2226   Code Status: Do not attempt resuscitation (DNR) PRE-ARREST INTERVENTIONS DESIRED  Family Communication:  None present at bedside.  Plan of care discussed with patient in length and he/she verbalized understanding and agreed with it.  Status is: Inpatient Remains inpatient appropriate because: Awaiting placement   Estimated body mass index is 30.84 kg/m as calculated from the following:   Height as of this encounter: 5\' 8"  (1.727 m).   Weight as of this encounter: 92 kg.    Nutritional Assessment: Body mass index is 30.84 kg/m.Aaron Aas Seen by dietician.  I agree with the assessment and plan as outlined below: Nutrition Status: Nutrition Problem: Inadequate oral intake Etiology: inability to eat Signs/Symptoms: NPO status Interventions: Tube feeding, Prostat, MVI, Juven  . Skin Assessment: I have examined the patient's skin and I agree with the wound assessment as performed by the wound care RN as outlined below:    Consultants:  Ophthalmology, general surgery, palliative care, critical care  Procedures:  As above  Antimicrobials:  Anti-infectives (From admission, onward)    Start     Dose/Rate Route Frequency Ordered Stop   01/20/24 2000  ceFEPIme  (MAXIPIME ) 2 g in sodium chloride  0.9 % 100 mL IVPB        2 g 200 mL/hr over 30 Minutes Intravenous Every 8 hours 01/20/24 1900     01/20/24 2000  Vancomycin  (VANCOCIN ) 1,250 mg in sodium chloride  0.9 % 250 mL IVPB        1,250 mg 166.7 mL/hr  over 90 Minutes Intravenous Every 24 hours 01/20/24 1900     01/03/24 1730  cefTRIAXone  (ROCEPHIN ) 1 g in sodium chloride  0.9 % 100 mL IVPB        1 g 200 mL/hr over 30 Minutes Intravenous Every 24 hours 01/03/24 1235 01/07/24 0710   01/02/24 1830  ceFEPIme  (MAXIPIME ) 2 g in sodium chloride  0.9 % 100 mL IVPB  Status:  Discontinued        2 g 200 mL/hr over 30 Minutes Intravenous Every 8 hours 01/02/24 1732 01/03/24 1235   01/02/24 1830  Vancomycin   (VANCOCIN ) 1,250 mg in sodium chloride  0.9 % 250 mL IVPB  Status:  Discontinued        1,250 mg 166.7 mL/hr over 90 Minutes Intravenous Every 24 hours 01/02/24 1732 01/04/24 1754   01/01/24 1200  cefTRIAXone  (ROCEPHIN ) 1 g in sodium chloride  0.9 % 100 mL IVPB  Status:  Discontinued        1 g 200 mL/hr over 30 Minutes Intravenous Every 24 hours 01/01/24 1057 01/02/24 1713   11/29/23 2200  linezolid  (ZYVOX ) tablet 600 mg        600 mg Per Tube Every 12 hours 11/29/23 1034 12/04/23 2215   11/28/23 1000  linezolid  (ZYVOX ) IVPB 600 mg  Status:  Discontinued        600 mg 300 mL/hr over 60 Minutes Intravenous Every 12 hours 11/28/23 0831 11/29/23 1034   11/23/23 1200  vancomycin  (VANCOREADY) IVPB 1250 mg/250 mL  Status:  Discontinued        1,250 mg 166.7 mL/hr over 90 Minutes Intravenous Every 24 hours 11/23/23 1036 11/28/23 0831   11/20/23 1100  vancomycin  (VANCOREADY) IVPB 1250 mg/250 mL  Status:  Discontinued        1,250 mg 166.7 mL/hr over 90 Minutes Intravenous Every 24 hours 11/20/23 0923 11/20/23 0923   11/20/23 1100  vancomycin  (VANCOREADY) IVPB 1250 mg/250 mL  Status:  Discontinued        1,250 mg 166.7 mL/hr over 90 Minutes Intravenous Every 24 hours 11/20/23 0923 11/23/23 1036   11/15/23 2300  vancomycin  (VANCOREADY) IVPB 1250 mg/250 mL        1,250 mg 166.7 mL/hr over 90 Minutes Intravenous Every 24 hours 11/15/23 2215 11/19/23 0924   11/12/23 2200  vancomycin  (VANCOREADY) IVPB 750 mg/150 mL  Status:  Discontinued        750 mg 150 mL/hr over 60 Minutes Intravenous Every 12 hours 11/12/23 0855 11/15/23 2215   11/12/23 0930  vancomycin  (VANCOREADY) IVPB 2000 mg/400 mL        2,000 mg 200 mL/hr over 120 Minutes Intravenous  Once 11/12/23 0838 11/12/23 1116   11/11/23 1345  Ampicillin -Sulbactam (UNASYN ) 3 g in sodium chloride  0.9 % 100 mL IVPB  Status:  Discontinued        3 g 200 mL/hr over 30 Minutes Intravenous Every 6 hours 11/11/23 1257 11/12/23 0836   10/28/23 0400   Ampicillin -Sulbactam (UNASYN ) 3 g in sodium chloride  0.9 % 100 mL IVPB  Status:  Discontinued        3 g 200 mL/hr over 30 Minutes Intravenous Every 6 hours 10/27/23 2151 11/02/23 0945   10/27/23 1845  vancomycin  (VANCOCIN ) IVPB 1000 mg/200 mL premix  Status:  Discontinued        1,000 mg 200 mL/hr over 60 Minutes Intravenous  Once 10/27/23 1843 10/27/23 1843   10/27/23 1845  ceFEPIme  (MAXIPIME ) 2 g in sodium chloride  0.9 % 100 mL IVPB  2 g 200 mL/hr over 30 Minutes Intravenous  Once 10/27/23 1843 10/27/23 2024   10/27/23 1845  vancomycin  (VANCOREADY) IVPB 2000 mg/400 mL        2,000 mg 200 mL/hr over 120 Minutes Intravenous  Once 10/27/23 1843 10/27/23 2123         Subjective: Patient seen and examined.  Nonverbal.  Appears comfortable.  Objective: Vitals:   01/22/24 0043 01/22/24 0421 01/22/24 0429 01/22/24 0500  BP: (!) 128/96  (!) 150/96   Pulse: 98 (!) 105 (!) 105   Resp: 20 14 16    Temp: 98.5 F (36.9 C)  98.5 F (36.9 C)   TempSrc: Axillary  Axillary   SpO2: 97% 96% 97%   Weight:    92 kg  Height:        Intake/Output Summary (Last 24 hours) at 01/22/2024 0822 Last data filed at 01/22/2024 0707 Gross per 24 hour  Intake 2059.48 ml  Output 1300 ml  Net 759.48 ml   Filed Weights   01/20/24 0500 01/21/24 0500 01/22/24 0500  Weight: 100.2 kg 93 kg 92 kg    Examination:  General exam: Appears calm and comfortable, does not appear to be tachypneic today. Respiratory system: Faint at the bases bilaterally.  Has trach collar. Cardiovascular system: S1 & S2 heard, sinus tachycardia. No JVD, murmurs, rubs, gallops or clicks. No pedal edema. Gastrointestinal system: Abdomen is nondistended, soft and nontender. No organomegaly or masses felt. Normal bowel sounds heard. Central nervous system: Alert but unable to assess orientation due to being nonverbal.  Not following commands.  Data Reviewed: I have personally reviewed following labs and imaging  studies  CBC: Recent Labs  Lab 01/16/24 0549 01/20/24 2032 01/21/24 0936  WBC 8.4 14.8* 14.2*  NEUTROABS  --  11.1* 9.2*  HGB 11.9* 13.2 12.4*  HCT 38.1* 41.7 38.9*  MCV 93.6 91.4 91.3  PLT 512* 534* 488*   Basic Metabolic Panel: Recent Labs  Lab 01/16/24 0549 01/17/24 0523 01/18/24 1503 01/20/24 2032  NA 135 136 135 135  K 3.9 4.2 3.9 4.2  CL 100 99 98 98  CO2 28 29 26 25   GLUCOSE 122* 94 129* 122*  BUN 26* 24* 19 19  CREATININE 0.59* 0.54* 0.59* 0.66  CALCIUM 9.1 9.4 9.3 9.1  MG 2.1  --   --   --   PHOS 4.2  --   --   --    GFR: Estimated Creatinine Clearance: 125.6 mL/min (by C-G formula based on SCr of 0.66 mg/dL). Liver Function Tests: Recent Labs  Lab 01/16/24 0549 01/17/24 0523  AST 68* 57*  ALT 257* 229*  ALKPHOS 65 63  BILITOT 0.6 0.3  PROT 7.2 7.1  ALBUMIN  3.1* 3.0*   No results for input(s): "LIPASE", "AMYLASE" in the last 168 hours. Recent Labs  Lab 01/17/24 0523 01/18/24 1503 01/19/24 0430  AMMONIA 65* 38* 26   Coagulation Profile: No results for input(s): "INR", "PROTIME" in the last 168 hours. Cardiac Enzymes: No results for input(s): "CKTOTAL", "CKMB", "CKMBINDEX", "TROPONINI" in the last 168 hours. BNP (last 3 results) No results for input(s): "PROBNP" in the last 8760 hours. HbA1C: No results for input(s): "HGBA1C" in the last 72 hours. CBG: Recent Labs  Lab 01/21/24 1420 01/21/24 1850 01/21/24 2210 01/22/24 0229 01/22/24 0618  GLUCAP 119* 118* 166* 110* 199*   Lipid Profile: No results for input(s): "CHOL", "HDL", "LDLCALC", "TRIG", "CHOLHDL", "LDLDIRECT" in the last 72 hours. Thyroid Function Tests: No results for input(s): "TSH", "  T4TOTAL", "FREET4", "T3FREE", "THYROIDAB" in the last 72 hours. Anemia Panel: No results for input(s): "VITAMINB12", "FOLATE", "FERRITIN", "TIBC", "IRON", "RETICCTPCT" in the last 72 hours. Sepsis Labs: Recent Labs  Lab 01/20/24 2032  PROCALCITON 0.16    Recent Results (from the past  240 hours)  Culture, blood (Routine X 2) w Reflex to ID Panel     Status: None (Preliminary result)   Collection Time: 01/20/24  8:32 PM   Specimen: BLOOD  Result Value Ref Range Status   Specimen Description BLOOD SITE NOT SPECIFIED  Final   Special Requests   Final    BOTTLES DRAWN AEROBIC AND ANAEROBIC Blood Culture results may not be optimal due to an inadequate volume of blood received in culture bottles   Culture   Final    NO GROWTH < 12 HOURS Performed at [facility], [address] [place], [name] [address]    Report Status PENDING  Incomplete  Culture, blood (Routine X 2) w Reflex to ID Panel     Status: None (Preliminary result)   Collection Time: 01/20/24  8:32 PM   Specimen: BLOOD  Result Value Ref Range Status   Specimen Description BLOOD SITE NOT SPECIFIED  Final   Special Requests   Final    BOTTLES DRAWN AEROBIC AND ANAEROBIC Blood Culture results may not be optimal due to an inadequate volume of blood received in culture bottles   Culture   Final    NO GROWTH < 12 HOURS Performed at [facility], [address], [place], [name] [address]    Report Status PENDING  Incomplete  Respiratory (~20 pathogens) panel by PCR     Status: None   Collection Time: 01/20/24  8:40 PM   Specimen: Nasopharyngeal Swab; Respiratory  Result Value Ref Range Status   Adenovirus NOT DETECTED NOT DETECTED Final   Coronavirus 229E NOT DETECTED NOT DETECTED Final    Comment: (NOTE) The Coronavirus on the Respiratory Panel, DOES NOT test for the novel  Coronavirus (2019 nCoV)    Coronavirus HKU1 NOT DETECTED NOT DETECTED Final   Coronavirus NL63 NOT DETECTED NOT DETECTED Final   Coronavirus OC43 NOT DETECTED NOT DETECTED Final   Metapneumovirus NOT DETECTED NOT DETECTED Final   Rhinovirus / Enterovirus NOT DETECTED NOT DETECTED Final   Influenza A NOT DETECTED NOT DETECTED Final   Influenza B NOT DETECTED NOT DETECTED Final   Parainfluenza Virus 1 NOT DETECTED NOT  DETECTED Final   Parainfluenza Virus 2 NOT DETECTED NOT DETECTED Final   Parainfluenza Virus 3 NOT DETECTED NOT DETECTED Final   Parainfluenza Virus 4 NOT DETECTED NOT DETECTED Final   Respiratory Syncytial Virus NOT DETECTED NOT DETECTED Final   Bordetella pertussis NOT DETECTED NOT DETECTED Final   Bordetella Parapertussis NOT DETECTED NOT DETECTED Final   Chlamydophila pneumoniae NOT DETECTED NOT DETECTED Final   Mycoplasma pneumoniae NOT DETECTED NOT DETECTED Final    Comment: Performed at [facility], [address], [place], [name] [address]  Resp panel by RT-PCR (RSV, Flu A&B, Covid) Anterior Nasal Swab     Status: None   Collection Time: 01/20/24  8:40 PM   Specimen: Anterior Nasal Swab  Result Value Ref Range Status   SARS Coronavirus 2 by RT PCR NEGATIVE NEGATIVE Final   Influenza A by PCR NEGATIVE NEGATIVE Final   Influenza B by PCR NEGATIVE NEGATIVE Final    Comment: (NOTE) The Xpert Xpress SARS-CoV-2/FLU/RSV plus assay is intended as an aid  in the diagnosis of influenza from Nasopharyngeal swab specimens and should not be used as a sole basis for treatment. Nasal washings and aspirates are unacceptable for Xpert Xpress SARS-CoV-2/FLU/RSV testing.  Fact Sheet for Patients: BloggerCourse.com  Fact Sheet for Healthcare Providers: SeriousBroker.it  This test is not yet approved or cleared by the [place]  FDA and has been authorized for detection and/or diagnosis of SARS-CoV-2 by FDA under an Emergency Use Authorization (EUA). This EUA will remain in effect (meaning this test can be used) for the duration of the COVID-19 declaration under Section 564(b)(1) of the Act, 21 U.S.C. section 360bbb-3(b)(1), unless the authorization is terminated or revoked.     Resp Syncytial Virus by PCR NEGATIVE NEGATIVE Final    Comment: (NOTE) Fact Sheet for Patients: BloggerCourse.com  Fact Sheet  for Healthcare Providers: SeriousBroker.it  This test is not yet approved or cleared by the [place]  FDA and has been authorized for detection and/or diagnosis of SARS-CoV-2 by FDA under an Emergency Use Authorization (EUA). This EUA will remain in effect (meaning this test can be used) for the duration of the COVID-19 declaration under Section 564(b)(1) of the Act, 21 U.S.C. section 360bbb-3(b)(1), unless the authorization is terminated or revoked.  Performed at [facility], [address], [place], [name] [address]   Culture, Respiratory w Gram Stain     Status: None (Preliminary result)   Collection Time: 01/21/24  5:55 AM   Specimen: Tracheal Aspirate  Result Value Ref Range Status   Specimen Description TRACHEAL ASPIRATE  Final   Special Requests NONE  Final   Gram Stain   Final    FEW WBC PRESENT, PREDOMINANTLY PMN FEW GRAM NEGATIVE RODS FEW GRAM POSITIVE COCCI RARE GRAM POSITIVE RODS Performed at [facility], [address] [place], [name] [address]    Culture PENDING  Incomplete   Report Status PENDING  Incomplete     Radiology Studies: DG CHEST PORT 1 VIEW Result Date: 01/20/2024 CLINICAL DATA:  Pneumonia EXAM: PORTABLE CHEST 1 VIEW COMPARISON:  Chest x-ray 12/22/2023 FINDINGS: The tip of the tracheostomy is at the level of the clavicular heads. The heart is enlarged. The lungs are clear. There is no pleural effusion or pneumothorax. No acute fractures are seen. IMPRESSION: Cardiomegaly. No acute cardiopulmonary process. Electronically Signed   By: Tyron Gallon M.D.   On: 01/20/2024 20:26    Scheduled Meds:  amLODipine   10 mg Per Tube Daily   artificial tears   Left Eye Q8H   Chlorhexidine  Gluconate Cloth  6 each Topical Daily   enoxaparin  (LOVENOX ) injection  40 mg Subcutaneous Daily   famotidine   20 mg Per Tube BID   feeding supplement (OSMOLITE 1.5 CAL)  237 mL Per Tube 5 X Daily   feeding supplement (PROSource  TF20)  60 mL Per Tube TID   free water   150 mL Per Tube Q4H   insulin  aspart  0-15 Units Subcutaneous 5 times per day   losartan   25 mg Per Tube Daily   metoprolol  tartrate  100 mg Per Tube BID   multivitamin with minerals  1 tablet Per Tube Daily   mouth rinse  15 mL Mouth Rinse 4 times per day   Continuous Infusions:  ceFEPime  (MAXIPIME ) IV 2 g (01/22/24 0400)   vancomycin  1,250 mg (01/21/24 2120)     LOS: 87 days   Modena Andes, MD [facility]  01/22/2024, 8:22 AM   *Please note that this is a verbal dictation  therefore any spelling or grammatical errors are due to the "Dragon Medical One" system interpretation.  Please page via Amion and do not message via secure chat for urgent patient care matters. Secure chat can be used for non urgent patient care matters.  How to contact the [facility] Attending or Consulting provider 7A - 7P or covering provider during after hours 7P -7A, for this patient?  Check the care team in [facility] and look for a) attending/consulting [facility] provider listed and b) the [facility] team listed. Page or secure chat 7A-7P. Log into [email or web address] and use [place]'s universal password to access. If you do not have the password, please contact the hospital operator. Locate the [facility] provider you are looking for under [facility] and page to a number that you can be directly reached. If you still have difficulty reaching the provider, please page the [facility] (Director on Call) for the Hospitalists listed on amion for assistance.

## 2024-01-23 DIAGNOSIS — I613 Nontraumatic intracerebral hemorrhage in brain stem: Secondary | ICD-10-CM | POA: Diagnosis not present

## 2024-01-23 LAB — BASIC METABOLIC PANEL WITH GFR
Anion gap: 13 (ref 5–15)
BUN: 21 mg/dL — ABNORMAL HIGH (ref 6–20)
CO2: 20 mmol/L — ABNORMAL LOW (ref 22–32)
Calcium: 9.3 mg/dL (ref 8.9–10.3)
Chloride: 103 mmol/L (ref 98–111)
Creatinine, Ser: 0.46 mg/dL — ABNORMAL LOW (ref 0.61–1.24)
GFR, Estimated: >60 mL/min (ref >60)
Glucose, Bld: 118 mg/dL — ABNORMAL HIGH (ref 70–99)
Potassium: 3.9 mmol/L (ref 3.5–5.1)
Sodium: 136 mmol/L (ref 135–145)

## 2024-01-23 LAB — CULTURE, RESPIRATORY W GRAM STAIN

## 2024-01-23 LAB — GLUCOSE, CAPILLARY
Glucose-Capillary: 128 mg/dL — ABNORMAL HIGH (ref 70–99)
Glucose-Capillary: 129 mg/dL — ABNORMAL HIGH (ref 70–99)
Glucose-Capillary: 129 mg/dL — ABNORMAL HIGH (ref 70–99)
Glucose-Capillary: 164 mg/dL — ABNORMAL HIGH (ref 70–99)
Glucose-Capillary: 166 mg/dL — ABNORMAL HIGH (ref 70–99)
Glucose-Capillary: 102 mg/dL — ABNORMAL HIGH (ref 70–99)

## 2024-01-23 MED ORDER — LOSARTAN POTASSIUM 50 MG PO TABS
100.0000 mg | ORAL_TABLET | Freq: Every day | ORAL | Status: DC
  Administered 2024-01-23 – 2024-02-25 (×31): 100 mg
  Filled 2024-01-23 (×35): qty 2

## 2024-01-23 NOTE — Progress Notes (Signed)
 PROGRESS NOTE    Garrett [name]  ZOX:[id number] DOB: 10-17-1975 DOA: 10/27/2023 PCP: Pcp, No   Brief Narrative:  48 year old with no known previous medical history was found down on 10/27/2023, unresponsive with UDS positive for opioids.  He was minimally responsive to Narcan  and he was found to be markedly hypertensive-  blood pressure 262/156.  CT head notable for 4.1 cm intracranial hemorrhage at the pons with intraventricular extension into the fourth ventricle and basal cisterns.  Initially admitted by critical care, and now extubated with tracheostomy, initially placed on 3/20, replaced on 01/05/2024.  PEG tube placed 11/08/2023.   Remains with poor response.  Stable on trach and PEG.  Disposition remains difficult given patient's need for PEG and tracheostomy tube care.  Currently awaiting disability.   Significant hospital events: 3/8: Intubated in ED, pontine bleed/SAH. CT head Acute large hemorrhage 4.1 cm in pons with intraventricular extension without hydrocephalus. 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  Can follow commands with eyes. 3/16 Purposeful with left upper extremity  3/22 tracheostomy placed at bedside, bleeding issues overnight from trach site 3/24 PEG 3/24 sputum culture with MRSA 4/20: trach was changed to cuffless #8 5/1: Mental status appears to be somewhat improving off narcotics, more awake alert following simple commands(able to grimace, move left toes, track vertically with right eye).  Speech following. Trach downsized to [employer] cuffless #6 XLT 5/4: Modafinil  held, low threshold to resume if mental status/ability to interact diminishes 5/10: PEG dislodged, foley bulb placed and IR consulted to replace tube 5/11: IR replaced PEG tube 5/13 - 5/14: + Fever.  Restarted antibiotics.  Urine culture + E. Coli, foley catheter changed. 5/17 Tracheostomy changed.   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  CT head with acute large hemorrhage 4.1 cm in the pons, ventricular extension. blood pressure 260/1 50s on admission. - Initially admitted to the intensive care unit under PCCM service and neurology following. - 2D echo ejection fraction 60%, A1c 4.7, LDL 96. - Was also treated with 3% hypertonic saline.  Follow-up CT head 3/29 with reduction in size of hemorrhage. -Patient now with trach collar and not a candidate for decannulation as per PCCM. -Patient now has a PEG tube and tolerating well. Speech therapy, PT OT is following.   Acute respiratory failure secondary to large pontine stroke brainstem compression Unable to protect airway/possible aspiration pneumonia - Now has trach collar.   PCCM intermittently following.  Recommended not a candidate for decannulation.  On 01/20/2024, patient started being tachycardic and then tachypneic and developed fever of 101.5.  Chest x-ray was done which was unremarkable however he was felt to have aspiration pneumonia clinically and since he has history of MRSA pneumonia in the past, he was started on vancomycin  and cefepime  empirically.  Procalcitonin unremarkable however that does not rule out pneumonia.  Patient did have leukocytosis as well.  I do agree that clinically he does appear to have pneumonia and thus I will continue dual antibiotics for  7 days.  Monitor closely.  Continue aspiration precautions.  Last temperature spike of 100.5  at 3 PM on 01/21/2024.   Hypertension: Blood pressure elevated at times mostly diastolic.  Continue amlodipine , metoprolol , but increase losartan  to 100 mg p.o. daily.     Transaminases: improving CT abdomen and pelvis 5/10: No focal liver abnormality seen, no gallstone, gallbladder wall thickening or biliary dilation - no significant medication that could cause transaminases. - acute viral hepatitis panel negative. - ammonia mildly elevated-treated with lactulose -ammonia is 26.    E. coli UTI completed antibiotics course 5/18 Foley catheter was changed 5/13.   Moderate to severe protein caloric malnutrition, POA Body max index 31 Obesity type I PEG tube in place, continue tube feedings per RD   PEG tube malfunction, resolved PEG dislodged 5/10, replaced on 5/11 by IR   Exposure keratopathy, OS Seen by ophthalmology, Dr. Mason Sole on 4/7.  Treated with maxitrol ointment (neo-poly-dex) TID OS for 1 week. -- Refresh PM TID to QID OS for chronic lubrication -- Can tape eyelid closed if still exposure an issue (plastic tape may work better than paper and would try to tape eyelid directly rather than with gauze to keep enough tension to ensure it isn't remaining open)   AKI with acute urinary retention Currently has a Foley catheter. Prior removals/voiding trial unsuccessful -- Last catheter change was on 5/13.   Macrocytic anemia thrombocytosis  stable/resolved   Perineal skin injury, POA  Continue Gerhard's Butt cream   Relative B12 and folate deficiency Continue replacement with MVI    Goal of care: Currently DNR with intervention.   Nutrition Problem: Inadequate oral intake Etiology: inability to eat   Interventions: Tube feeding, Prostat, MVI, Juven   Estimated body mass index is 33.6 kg/m as calculated from the following:   Height as of this encounter: 5\' 8"  (1.727 m).   Weight as of this encounter: 100.2 kg.  DVT prophylaxis: enoxaparin  (LOVENOX ) injection 40 mg Start:  01/17/24 1000 SCDs Start: 10/27/23 2226   Code Status: Do not attempt resuscitation (DNR) PRE-ARREST INTERVENTIONS DESIRED  Family Communication:  None present at bedside.  Plan of care discussed with patient in length and he/she verbalized understanding and agreed with it.  Status is: Inpatient Remains inpatient appropriate because: Awaiting placement   Estimated body mass index is 30.94 kg/m as calculated from the following:   Height as of this encounter: 5\' 8"  (1.727 m).   Weight as of this encounter: 92.3 kg.    Nutritional Assessment: Body mass index is 30.94 kg/m.Aaron Aas Seen by dietician.  I agree with the assessment and plan as outlined below: Nutrition Status: Nutrition Problem: Inadequate oral intake Etiology: inability to eat Signs/Symptoms: NPO status Interventions: Tube feeding, Prostat, MVI, Juven  . Skin Assessment: I have examined the patient's skin and I agree with the wound assessment as performed by the wound care RN as outlined below:    Consultants:  Ophthalmology, general surgery, palliative care, critical care  Procedures:  As above  Antimicrobials:  Anti-infectives (From admission, onward)    Start     Dose/Rate Route Frequency Ordered Stop   01/22/24 2200  Vancomycin  (VANCOCIN ) 1,250 mg in sodium chloride  0.9 % 250 mL IVPB  Status:  Discontinued        1,250 mg 166.7 mL/hr over 90 Minutes Intravenous Every 24 hours 01/22/24 1013 01/22/24 1020   01/22/24 2000  vancomycin  (VANCOREADY) IVPB 1250 mg/250 mL        1,250 mg 166.7 mL/hr  over 90 Minutes Intravenous Every 24 hours 01/22/24 1020     01/20/24 2000  ceFEPIme  (MAXIPIME ) 2 g in sodium chloride  0.9 % 100 mL IVPB        2 g 200 mL/hr over 30 Minutes Intravenous Every 8 hours 01/20/24 1900     01/20/24 2000  Vancomycin  (VANCOCIN ) 1,250 mg in sodium chloride  0.9 % 250 mL IVPB  Status:  Discontinued        1,250 mg 166.7 mL/hr over 90 Minutes Intravenous Every 24 hours 01/20/24 1900 01/22/24 1013    01/03/24 1730  cefTRIAXone  (ROCEPHIN ) 1 g in sodium chloride  0.9 % 100 mL IVPB        1 g 200 mL/hr over 30 Minutes Intravenous Every 24 hours 01/03/24 1235 01/07/24 0710   01/02/24 1830  ceFEPIme  (MAXIPIME ) 2 g in sodium chloride  0.9 % 100 mL IVPB  Status:  Discontinued        2 g 200 mL/hr over 30 Minutes Intravenous Every 8 hours 01/02/24 1732 01/03/24 1235   01/02/24 1830  Vancomycin  (VANCOCIN ) 1,250 mg in sodium chloride  0.9 % 250 mL IVPB  Status:  Discontinued        1,250 mg 166.7 mL/hr over 90 Minutes Intravenous Every 24 hours 01/02/24 1732 01/04/24 1754   01/01/24 1200  cefTRIAXone  (ROCEPHIN ) 1 g in sodium chloride  0.9 % 100 mL IVPB  Status:  Discontinued        1 g 200 mL/hr over 30 Minutes Intravenous Every 24 hours 01/01/24 1057 01/02/24 1713   11/29/23 2200  linezolid  (ZYVOX ) tablet 600 mg        600 mg Per Tube Every 12 hours 11/29/23 1034 12/04/23 2215   11/28/23 1000  linezolid  (ZYVOX ) IVPB 600 mg  Status:  Discontinued        600 mg 300 mL/hr over 60 Minutes Intravenous Every 12 hours 11/28/23 0831 11/29/23 1034   11/23/23 1200  vancomycin  (VANCOREADY) IVPB 1250 mg/250 mL  Status:  Discontinued        1,250 mg 166.7 mL/hr over 90 Minutes Intravenous Every 24 hours 11/23/23 1036 11/28/23 0831   11/20/23 1100  vancomycin  (VANCOREADY) IVPB 1250 mg/250 mL  Status:  Discontinued        1,250 mg 166.7 mL/hr over 90 Minutes Intravenous Every 24 hours 11/20/23 0923 11/20/23 0923   11/20/23 1100  vancomycin  (VANCOREADY) IVPB 1250 mg/250 mL  Status:  Discontinued        1,250 mg 166.7 mL/hr over 90 Minutes Intravenous Every 24 hours 11/20/23 0923 11/23/23 1036   11/15/23 2300  vancomycin  (VANCOREADY) IVPB 1250 mg/250 mL        1,250 mg 166.7 mL/hr over 90 Minutes Intravenous Every 24 hours 11/15/23 2215 11/19/23 0924   11/12/23 2200  vancomycin  (VANCOREADY) IVPB 750 mg/150 mL  Status:  Discontinued        750 mg 150 mL/hr over 60 Minutes Intravenous Every 12 hours 11/12/23 0855  11/15/23 2215   11/12/23 0930  vancomycin  (VANCOREADY) IVPB 2000 mg/400 mL        2,000 mg 200 mL/hr over 120 Minutes Intravenous  Once 11/12/23 0838 11/12/23 1116   11/11/23 1345  Ampicillin -Sulbactam (UNASYN ) 3 g in sodium chloride  0.9 % 100 mL IVPB  Status:  Discontinued        3 g 200 mL/hr over 30 Minutes Intravenous Every 6 hours 11/11/23 1257 11/12/23 0836   10/28/23 0400  Ampicillin -Sulbactam (UNASYN ) 3 g in sodium chloride  0.9 % 100 mL IVPB  Status:  Discontinued        3 g 200 mL/hr over 30 Minutes Intravenous Every 6 hours 10/27/23 2151 11/02/23 0945   10/27/23 1845  vancomycin  (VANCOCIN ) IVPB 1000 mg/200 mL premix  Status:  Discontinued        1,000 mg 200 mL/hr over 60 Minutes Intravenous  Once 10/27/23 1843 10/27/23 1843   10/27/23 1845  ceFEPIme  (MAXIPIME ) 2 g in sodium chloride  0.9 % 100 mL IVPB        2 g 200 mL/hr over 30 Minutes Intravenous  Once 10/27/23 1843 10/27/23 2024   10/27/23 1845  vancomycin  (VANCOREADY) IVPB 2000 mg/400 mL        2,000 mg 200 mL/hr over 120 Minutes Intravenous  Once 10/27/23 1843 10/27/23 2123         Subjective: Patient seen and examined.  Nonverbal.  Appears stable at baseline.  Objective: Vitals:   01/23/24 0032 01/23/24 0339 01/23/24 0421 01/23/24 0438  BP: (!) 146/108   (!) 135/101  Pulse: 99 (!) 103  96  Resp: 16 19  18   Temp: 98.1 F (36.7 C)   98.7 F (37.1 C)  TempSrc: Oral   Oral  SpO2: 100% 97%  98%  Weight:   92.3 kg   Height:        Intake/Output Summary (Last 24 hours) at 01/23/2024 0735 Last data filed at 01/23/2024 7829 Gross per 24 hour  Intake 1287 ml  Output 2350 ml  Net -1063 ml   Filed Weights   01/21/24 0500 01/22/24 0500 01/23/24 0421  Weight: 93 kg 92 kg 92.3 kg    Examination:  General exam: Appears calm and comfortable, does not appear to be tachypneic today. Respiratory system: Faint at the bases bilaterally.  Has trach collar. Cardiovascular system: S1 & S2 heard, sinus tachycardia. No  JVD, murmurs, rubs, gallops or clicks. No pedal edema. Gastrointestinal system: Abdomen is nondistended, soft and nontender. No organomegaly or masses felt. Normal bowel sounds heard. Central nervous system: Alert but unable to assess orientation due to being nonverbal.  Not following commands.  Data Reviewed: I have personally reviewed following labs and imaging studies  CBC: Recent Labs  Lab 01/20/24 2032 01/21/24 0936  WBC 14.8* 14.2*  NEUTROABS 11.1* 9.2*  HGB 13.2 12.4*  HCT 41.7 38.9*  MCV 91.4 91.3  PLT 534* 488*   Basic Metabolic Panel: Recent Labs  Lab 01/17/24 0523 01/18/24 1503 01/20/24 2032 01/23/24 0530  NA 136 135 135 136  K 4.2 3.9 4.2 3.9  CL 99 98 98 103  CO2 29 26 25  20*  GLUCOSE 94 129* 122* 118*  BUN 24* 19 19 21*  CREATININE 0.54* 0.59* 0.66 0.46*  CALCIUM 9.4 9.3 9.1 9.3   GFR: Estimated Creatinine Clearance: 125.9 mL/min (A) (by C-G formula based on SCr of 0.46 mg/dL (L)). Liver Function Tests: Recent Labs  Lab 01/17/24 0523  AST 57*  ALT 229*  ALKPHOS 63  BILITOT 0.3  PROT 7.1  ALBUMIN  3.0*   No results for input(s): "LIPASE", "AMYLASE" in the last 168 hours. Recent Labs  Lab 01/17/24 0523 01/18/24 1503 01/19/24 0430  AMMONIA 65* 38* 26   Coagulation Profile: No results for input(s): "INR", "PROTIME" in the last 168 hours. Cardiac Enzymes: No results for input(s): "CKTOTAL", "CKMB", "CKMBINDEX", "TROPONINI" in the last 168 hours. BNP (last 3 results) No results for input(s): "PROBNP" in the last 8760 hours. HbA1C: No results for input(s): "HGBA1C" in the last 72 hours. CBG: Recent  Labs  Lab 01/22/24 1051 01/22/24 1707 01/22/24 2100 01/23/24 0551 01/23/24 0626  GLUCAP 129* 117* 153* 129* 128*   Lipid Profile: No results for input(s): "CHOL", "HDL", "LDLCALC", "TRIG", "CHOLHDL", "LDLDIRECT" in the last 72 hours. Thyroid Function Tests: No results for input(s): "TSH", "T4TOTAL", "FREET4", "T3FREE", "THYROIDAB" in the  last 72 hours. Anemia Panel: No results for input(s): "VITAMINB12", "FOLATE", "FERRITIN", "TIBC", "IRON", "RETICCTPCT" in the last 72 hours. Sepsis Labs: Recent Labs  Lab 01/20/24 2032  PROCALCITON 0.16    Recent Results (from the past 240 hours)  Culture, blood (Routine X 2) w Reflex to ID Panel     Status: None (Preliminary result)   Collection Time: 01/20/24  8:32 PM   Specimen: BLOOD  Result Value Ref Range Status   Specimen Description BLOOD SITE NOT SPECIFIED  Final   Special Requests   Final    BOTTLES DRAWN AEROBIC AND ANAEROBIC Blood Culture results may not be optimal due to an inadequate volume of blood received in culture bottles   Culture   Final    NO GROWTH 2 DAYS Performed at [facility], [address], [place], [name] [address]    Report Status PENDING  Incomplete  Culture, blood (Routine X 2) w Reflex to ID Panel     Status: None (Preliminary result)   Collection Time: 01/20/24  8:32 PM   Specimen: BLOOD  Result Value Ref Range Status   Specimen Description BLOOD SITE NOT SPECIFIED  Final   Special Requests   Final    BOTTLES DRAWN AEROBIC AND ANAEROBIC Blood Culture results may not be optimal due to an inadequate volume of blood received in culture bottles   Culture   Final    NO GROWTH 2 DAYS Performed at [facility], [address] [place], [name] [address]    Report Status PENDING  Incomplete  Respiratory (~20 pathogens) panel by PCR     Status: None   Collection Time: 01/20/24  8:40 PM   Specimen: Nasopharyngeal Swab; Respiratory  Result Value Ref Range Status   Adenovirus NOT DETECTED NOT DETECTED Final   Coronavirus 229E NOT DETECTED NOT DETECTED Final    Comment: (NOTE) The Coronavirus on the Respiratory Panel, DOES NOT test for the novel  Coronavirus (2019 nCoV)    Coronavirus HKU1 NOT DETECTED NOT DETECTED Final   Coronavirus NL63 NOT DETECTED NOT DETECTED Final   Coronavirus OC43 NOT DETECTED NOT DETECTED Final    Metapneumovirus NOT DETECTED NOT DETECTED Final   Rhinovirus / Enterovirus NOT DETECTED NOT DETECTED Final   Influenza A NOT DETECTED NOT DETECTED Final   Influenza B NOT DETECTED NOT DETECTED Final   Parainfluenza Virus 1 NOT DETECTED NOT DETECTED Final   Parainfluenza Virus 2 NOT DETECTED NOT DETECTED Final   Parainfluenza Virus 3 NOT DETECTED NOT DETECTED Final   Parainfluenza Virus 4 NOT DETECTED NOT DETECTED Final   Respiratory Syncytial Virus NOT DETECTED NOT DETECTED Final   Bordetella pertussis NOT DETECTED NOT DETECTED Final   Bordetella Parapertussis NOT DETECTED NOT DETECTED Final   Chlamydophila pneumoniae NOT DETECTED NOT DETECTED Final   Mycoplasma pneumoniae NOT DETECTED NOT DETECTED Final    Comment: Performed at [facility], [address], [place], [name] [address]  Resp panel by RT-PCR (RSV, Flu A&B, Covid) Anterior Nasal Swab     Status: None   Collection Time: 01/20/24  8:40 PM   Specimen: Anterior Nasal Swab  Result Value Ref Range Status  SARS Coronavirus 2 by RT PCR NEGATIVE NEGATIVE Final   Influenza A by PCR NEGATIVE NEGATIVE Final   Influenza B by PCR NEGATIVE NEGATIVE Final    Comment: (NOTE) The Xpert Xpress SARS-CoV-2/FLU/RSV plus assay is intended as an aid in the diagnosis of influenza from Nasopharyngeal swab specimens and should not be used as a sole basis for treatment. Nasal washings and aspirates are unacceptable for Xpert Xpress SARS-CoV-2/FLU/RSV testing.  Fact Sheet for Patients: BloggerCourse.com  Fact Sheet for Healthcare Providers: SeriousBroker.it  This test is not yet approved or cleared by the [place]  FDA and has been authorized for detection and/or diagnosis of SARS-CoV-2 by FDA under an Emergency Use Authorization (EUA). This EUA will remain in effect (meaning this test can be used) for the duration of the COVID-19 declaration under Section 564(b)(1) of the Act, 21  U.S.C. section 360bbb-3(b)(1), unless the authorization is terminated or revoked.     Resp Syncytial Virus by PCR NEGATIVE NEGATIVE Final    Comment: (NOTE) Fact Sheet for Patients: BloggerCourse.com  Fact Sheet for Healthcare Providers: SeriousBroker.it  This test is not yet approved or cleared by the [place]  FDA and has been authorized for detection and/or diagnosis of SARS-CoV-2 by FDA under an Emergency Use Authorization (EUA). This EUA will remain in effect (meaning this test can be used) for the duration of the COVID-19 declaration under Section 564(b)(1) of the Act, 21 U.S.C. section 360bbb-3(b)(1), unless the authorization is terminated or revoked.  Performed at [facility], [address], [place], [name] [address]   Culture, Respiratory w Gram Stain     Status: None (Preliminary result)   Collection Time: 01/21/24  5:55 AM   Specimen: Tracheal Aspirate  Result Value Ref Range Status   Specimen Description TRACHEAL ASPIRATE  Final   Special Requests NONE  Final   Gram Stain   Final    FEW WBC PRESENT, PREDOMINANTLY PMN FEW GRAM NEGATIVE RODS FEW GRAM POSITIVE COCCI RARE GRAM POSITIVE RODS    Culture   Final    CULTURE REINCUBATED FOR BETTER GROWTH Performed at [facility], [address] [place], [name] [address]    Report Status PENDING  Incomplete     Radiology Studies: No results found.   Scheduled Meds:  amLODipine   10 mg Per Tube Daily   artificial tears   Left Eye Q8H   Chlorhexidine  Gluconate Cloth  6 each Topical Daily   enoxaparin  (LOVENOX ) injection  40 mg Subcutaneous Daily   famotidine   20 mg Per Tube BID   feeding supplement (OSMOLITE 1.5 CAL)  237 mL Per Tube 5 X Daily   feeding supplement (PROSource TF20)  60 mL Per Tube TID   free water   150 mL Per Tube Q4H   insulin  aspart  0-15 Units Subcutaneous 5 times per day   losartan   100 mg Per Tube Daily   metoprolol   tartrate  100 mg Per Tube BID   multivitamin with minerals  1 tablet Per Tube Daily   mouth rinse  15 mL Mouth Rinse 4 times per day   Continuous Infusions:  ceFEPime  (MAXIPIME ) IV 2 g (01/23/24 0415)   vancomycin  1,250 mg (01/22/24 2216)     LOS: 88 days   Modena Andes, MD [facility]  01/23/2024, 7:35 AM   *Please note that this is a verbal dictation therefore any spelling or grammatical errors are due to the "Dragon Medical One" system interpretation.  Please page via Amion and do  not message via secure chat for urgent patient care matters. Secure chat can be used for non urgent patient care matters.  How to contact the [facility] Attending or Consulting provider 7A - 7P or covering provider during after hours 7P -7A, for this patient?  Check the care team in [facility] and look for a) attending/consulting [facility] provider listed and b) the [facility] team listed. Page or secure chat 7A-7P. Log into [email or web address] and use [employer]'s universal password to access. If you do not have the password, please contact the hospital operator. Locate the [facility] provider you are looking for under [facility] and page to a number that you can be directly reached. If you still have difficulty reaching the provider, please page the [facility] (Director on Call) for the Hospitalists listed on amion for assistance.

## 2024-01-23 NOTE — Progress Notes (Signed)
 Occupational Therapy Treatment Patient Details Name: Garrett [name] MRN: [id number] DOB: 1975/09/17 Today's Date: 01/23/2024   History of present illness Pt is a 48 yo male who was found down 10/27/23 with agonal breathing. 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 Intubated 3/8, cortrak placed 3/14, trach placed 3/22, PEG placed 3/24. PMH: HTN, smoker   OT comments  Pt tolerated eob activity and communicated with L UE using a ball in the room with grab and release as the yes/ no. Pt was 100% accurate. Pt tolerated EOB for 45 minutes. Pt initiated L scapular movement and cervical rotation R and L. OT/ SLP to continue to explore methods of communication and UE activation each session. Recommendation skilled inpatient follow up therapy, <3 hours/day.       If plan is discharge home, recommend the following:  Two people to help with walking and/or transfers;Two people to help with bathing/dressing/bathroom;Direct supervision/assist for medications management;Direct supervision/assist for financial management;Assist for transportation;Help with stairs or ramp for entrance   Equipment Recommendations  Wheelchair (measurements OT);Wheelchair cushion (measurements OT);Hospital bed;Hoyer lift    Recommendations for Other Services Speech consult    Precautions / Restrictions Precautions Precautions: Fall Recall of Precautions/Restrictions: Impaired Precaution/Restrictions Comments: trach, PEG, abdominal binder, SBP < 160 Required Braces or Orthoses: Other Brace Splint/Cast: Bil resting hand splints Splint/Cast - Date Prophylactic Dressing Applied (if applicable): 11/02/23 Other Brace: has bilateral prevalon boots Restrictions Weight Bearing Restrictions Per Provider Order: No       Mobility Bed Mobility Overal bed  mobility: Needs Assistance Bed Mobility: Supine to Sit, Sit to Supine Rolling: +2 for physical assistance, +2 for safety/equipment, Total assist Sidelying to sit: Total assist, +2 for physical assistance, +2 for safety/equipment, HOB elevated Supine to sit: Total assist, +2 for physical assistance Sit to supine: Total assist, +2 for physical assistance   General bed mobility comments: moving legs to EOB and lifting trunk; to supine A for trunk and legs and scooting up in bed/positioning for proper posture/respirations and trunk control    Transfers                         Balance Overall balance assessment: Needs assistance Sitting-balance support: Feet supported, No upper extremity supported Sitting balance-Leahy Scale: Zero                                     ADL either performed or assessed with clinical judgement   ADL Overall ADL's : Needs assistance/impaired                                       General ADL Comments: total (A)    Extremity/Trunk Assessment Upper Extremity Assessment Upper Extremity Assessment: RUE deficits/detail;LUE deficits/detail RUE Deficits / Details: no activaiton noted no scapula movement LUE Deficits / Details: pt noted to have have scapula activation with shrug initiated. thumb activation and release of a ball demonstrated for communication LUE Coordination: decreased fine motor;decreased gross motor   Lower Extremity Assessment Lower Extremity Assessment: Defer to PT evaluation        Vision   Vision Assessment?: Vision impaired- to be further tested in functional context  Perception     Praxis     Communication Communication Communication: Impaired Factors Affecting Communication: Trach/intubated   Cognition Arousal: Alert Behavior During Therapy: Flat affect   Difficult to assess due to: Impaired communication           OT - Cognition Comments: communicates with eye movement                  Following commands: Impaired Following commands impaired: Follows one step commands with increased time      Cueing   Cueing Techniques: Other (comments), Gestural cues, Tactile cues (auditory)  Exercises Exercises: Other exercises Other Exercises Other Exercises: pt using L hand to release ball to answer yes and holding ball to say no needs about 5-10 seconds to respond Other Exercises: pt answering questions 100% accuracy Other Exercises: scapula activation on L side noted not R -- attempting to move arms at the same time to faciliate    Shoulder Instructions       General Comments 28% trach collar tolerated PMV throughout session SLP present    Pertinent Vitals/ Pain       Pain Assessment Pain Assessment: Faces Faces Pain Scale: No hurt  Home Living                                          Prior Functioning/Environment              Frequency  Min 1X/week        Progress Toward Goals  OT Goals(current goals can now be found in the care plan section)  Progress towards OT goals: Progressing toward goals  Acute Rehab OT Goals Patient Stated Goal: communicates L UE OT Goal Formulation: Patient unable to participate in goal setting Time For Goal Achievement: 02/06/24 Potential to Achieve Goals: Fair ADL Goals Pt/caregiver will Perform Home Exercise Program: Increased ROM;Both right and left upper extremity Additional ADL Goal #1: Pt will follow 1 step commands with 50% accuracy in a nondistracting enviornment. Additional ADL Goal #2: Pt will complete bed mobility with max assist +2, maintain sitting balance statically for 3 minutes with mod assist. Additional ADL Goal #3: Pt will demonstrates grasp and release with L UE x3 out 3 attempts  Plan      Co-evaluation    PT/OT/SLP Co-Evaluation/Treatment: Yes Reason for Co-Treatment: Complexity of the patient's impairments (multi-system involvement);Necessary to address  cognition/behavior during functional activity   OT goals addressed during session: ADL's and self-care      AM-PAC OT "6 Clicks" Daily Activity     Outcome Measure   Help from another person eating meals?: Total Help from another person taking care of personal grooming?: Total Help from another person toileting, which includes using toliet, bedpan, or urinal?: Total Help from another person bathing (including washing, rinsing, drying)?: Total Help from another person to put on and taking off regular upper body clothing?: Total Help from another person to put on and taking off regular lower body clothing?: Total 6 Click Score: 6    End of Session Equipment Utilized During Treatment: Oxygen  OT Visit Diagnosis: Other abnormalities of gait and mobility (R26.89);Muscle weakness (generalized) (M62.81);Other symptoms and signs involving the nervous system (R29.898);Other symptoms and signs involving cognitive function;Hemiplegia and hemiparesis Hemiplegia - caused by: Other Nontraumatic intracranial hemorrhage   Activity Tolerance Patient tolerated treatment well   Patient Left in bed;with call  bell/phone within reach;with SCD's reapplied   Nurse Communication Mobility status;Precautions        Time: 1345 [phone] OT Time Calculation (min): 48 min  Charges: OT General Charges $OT Visit: 1 Visit OT Treatments $Therapeutic Activity: 8-22 mins $Neuromuscular Re-education: 23-37 mins   Brynn, OTR/L  Acute Rehabilitation Services Office: [phone] .   Neomia Banner 01/23/2024, 4:46 PM

## 2024-01-23 NOTE — Progress Notes (Signed)
 98.1 122/93 103 87 Spo2 98

## 2024-01-23 NOTE — Progress Notes (Signed)
 Nutrition Follow-up  DOCUMENTATION CODES:  Not applicable  INTERVENTION:  Continue tube feeding via PEG 5 cartons of Osmolite 1.5 per day Prosource TF20 60 ml TID free water  6x/d Provides 2015 kcal, 134 gm protein, 900 ml free water  daily ( free water , TF + flush) MVI with minerals daily  NUTRITION DIAGNOSIS:  Inadequate oral intake related to inability to eat as evidenced by NPO status. 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 UDS positive for opioids. Noted aspiration PNA on admission.   3/08 - admitted with [facility], intubated 3/14 - s/p cortrak placement (tip gastric) 3/22 - s/p tracheostomy 3/24 - s/p PEG 4/20 - Cuff changed to cuffless 5/10 - PEG dislodged, TF's stopped  5/11 - PEG replaced, TF's restarted at goal   Pt resting in bed at the time of assessment. RN about to administer bolus feed. No significant changes and pt is medically stable for discharge once placement is obtained.  Tolerating bolus feeding regimen well.   Admit weight: 103 kg  Current weight: 92.3 kg   Nutritionally Relevant Medications: Scheduled Meds:  famotidine   20 mg Per Tube BID   OSMOLITE 1.5 CAL  237 mL Per Tube 5 X Daily   PROSource TF20  60 mL Per Tube TID   free water   150 mL Per Tube Q4H   insulin  aspart  0-15 Units Subcutaneous 5 times per day   multivitamin with minerals  1 tablet Per Tube Daily   Continuous Infusions:  ceFEPime  (MAXIPIME ) IV 2 g (01/23/24 0415)   vancomycin  1,250 mg (01/22/24 2216)   PRN Meds: bisacodyl , polyethylene glycol  Labs Reviewed: BUN 21, Creatinine 0.46 CBG ranges from 110-199  mg/dL over the last 24 hours HgbA1c 4.7%  Diet Order:   Diet Order             Diet NPO time specified  Diet effective now                   EDUCATION NEEDS:  No education needs have been identified at this time  Skin: Reviewed RN skin assessment  Last BM:  6/3  Height:  Ht Readings from Last 1 Encounters:  11/22/23 5\' 8"  (1.727 m)    Weight:  Wt Readings from Last 1 Encounters:  01/23/24 92.3 kg    Ideal Body Weight:  70 kg  BMI:  Body mass index is 30.94 kg/m.  Estimated Nutritional Needs:  Kcal:  2000-2200 Protein:  125-140 grams Fluid:  >2 L/day    Edwena Graham, RD, LDN Registered Dietitian II Please reach out via secure chat

## 2024-01-23 NOTE — Progress Notes (Signed)
 Speech Language Pathology Treatment: Cognitive-Linquistic;Passy Muir Speaking valve  Patient Details Name: Garrett [name] MRN: [id number] DOB: 02-Apr-1976 Today's Date: 01/23/2024 Time: 4034-7425 SLP Time Calculation (min) (ACUTE ONLY): 48 min  Assessment / Plan / Recommendation Clinical Impression  Co-treatment with OT to optimize success, participation, and respiratory support for voicing, functional communication. Pt sat EOB with assist from OT.  He answered yes/no questions using both the vertical eye gaze board and thumb movement/ball release (to assess potential for switch use).  Responses to biographical questions were 90% reliable.  He was able to indicate some info about his likes/dislikes when affirming he enjoyed basketball, baseball and R&B and rap (while disaffirming other options offered to him). He showed visible effort and attention during session, demonstrating ability to follow some abstract commands with intention.  Tolerated PMV without difficulty - voicing spontaneously but not on command.    Continue to follow for basic communication goals - he has better success during co-treatments.    HPI HPI: Pt is a 48 yo male who was found down 10/27/23 with agonal breathing. 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 Intubated 3/8, cortrak placed 3/14, trach placed 3/22, PEG placed 3/24. PMH: HTN, smoker      SLP Plan  Continue with current plan of care      Recommendations for follow up therapy are one component of a multi-disciplinary discharge planning process, led by the attending physician.  Recommendations may be updated based on patient status, additional functional criteria and insurance authorization.    Recommendations  Diet recommendations: NPO Medication Administration: Via alternative means       Patient may use Passy-Muir Speech Valve: During all therapies with supervision;Intermittently with supervision PMSV Supervision: Full           Oral care QID   Frequent or constant Supervision/Assistance Aphonia (R49.1);Cognitive communication deficit (R41.841)     Continue with current plan of care   Garrett Mangas L. Garrett Lincoln, MA CCC/SLP Clinical Specialist - Acute Care SLP Acute Rehabilitation Services Office number [phone]   Garrett [name]  01/23/2024, 4:01 PM

## 2024-01-24 DIAGNOSIS — I613 Nontraumatic intracerebral hemorrhage in brain stem: Secondary | ICD-10-CM | POA: Diagnosis not present

## 2024-01-24 LAB — CBC WITH DIFFERENTIAL/PLATELET
Abs Immature Granulocytes: 0.02 10*3/uL (ref 0.00–0.07)
Basophils Absolute: 0 10*3/uL (ref 0.0–0.1)
Basophils Relative: 1 %
Eosinophils Absolute: 0.1 10*3/uL (ref 0.0–0.5)
Eosinophils Relative: 2 %
HCT: 39 % (ref 39.0–52.0)
Hemoglobin: 12.1 g/dL — ABNORMAL LOW (ref 13.0–17.0)
Immature Granulocytes: 0 %
Lymphocytes Relative: 25 %
Lymphs Abs: 1.5 10*3/uL (ref 0.7–4.0)
MCH: 28.7 pg (ref 26.0–34.0)
MCHC: 31 g/dL (ref 30.0–36.0)
MCV: 92.4 fL (ref 80.0–100.0)
Monocytes Absolute: 0.6 10*3/uL (ref 0.1–1.0)
Monocytes Relative: 10 %
Neutro Abs: 3.8 10*3/uL (ref 1.7–7.7)
Neutrophils Relative %: 62 %
Platelets: 552 10*3/uL — ABNORMAL HIGH (ref 150–400)
RBC: 4.22 MIL/uL (ref 4.22–5.81)
RDW: 14.4 % (ref 11.5–15.5)
WBC: 6.2 10*3/uL (ref 4.0–10.5)
nRBC: 0 % (ref 0.0–0.2)

## 2024-01-24 LAB — BASIC METABOLIC PANEL WITH GFR
Anion gap: 13 (ref 5–15)
BUN: 20 mg/dL (ref 6–20)
CO2: 24 mmol/L (ref 22–32)
Calcium: 9.6 mg/dL (ref 8.9–10.3)
Chloride: 101 mmol/L (ref 98–111)
Creatinine, Ser: 0.64 mg/dL (ref 0.61–1.24)
GFR, Estimated: >60 mL/min (ref >60)
Glucose, Bld: 108 mg/dL — ABNORMAL HIGH (ref 70–99)
Potassium: 3.9 mmol/L (ref 3.5–5.1)
Sodium: 138 mmol/L (ref 135–145)

## 2024-01-24 LAB — GLUCOSE, CAPILLARY
Glucose-Capillary: 130 mg/dL — ABNORMAL HIGH (ref 70–99)
Glucose-Capillary: 103 mg/dL — ABNORMAL HIGH (ref 70–99)
Glucose-Capillary: 99 mg/dL (ref 70–99)
Glucose-Capillary: 147 mg/dL — ABNORMAL HIGH (ref 70–99)
Glucose-Capillary: 127 mg/dL — ABNORMAL HIGH (ref 70–99)
Glucose-Capillary: 106 mg/dL — ABNORMAL HIGH (ref 70–99)

## 2024-01-24 MED ORDER — VANCOMYCIN HCL 1250 MG/250ML IV SOLN
1250.0000 mg | INTRAVENOUS | Status: AC
  Administered 2024-01-24 – 2024-01-26 (×3): 1250 mg via INTRAVENOUS
  Filled 2024-01-24 (×5): qty 250

## 2024-01-24 NOTE — Plan of Care (Signed)
  Problem: Education: Goal: Knowledge of disease or condition will improve Outcome: Progressing Goal: Knowledge of secondary prevention will improve (MUST DOCUMENT ALL) 01/24/2024 0321 by Darcey Earthly, RN Outcome: Progressing 01/24/2024 0245 by Darcey Earthly, RN Outcome: Progressing Goal: Knowledge of patient specific risk factors will improve (DELETE if not current risk factor) Outcome: Progressing   Problem: Intracerebral Hemorrhage Tissue Perfusion: Goal: Complications of Intracerebral Hemorrhage will be minimized Outcome: Progressing   Problem: Coping: Goal: Will verbalize positive feelings about self Outcome: Progressing   Problem: Nutrition: Goal: Risk of aspiration will decrease Outcome: Progressing Goal: Dietary intake will improve Outcome: Progressing

## 2024-01-24 NOTE — Progress Notes (Addendum)
 PROGRESS NOTE    Garrett [name]  VWU:[id number] DOB: 1976/01/21 DOA: 10/27/2023 PCP: Pcp, No   Brief Narrative:  48 year old with no known previous medical history was found down on 10/27/2023, unresponsive with UDS positive for opioids.  He was minimally responsive to Narcan  and he was found to be markedly hypertensive-  blood pressure 262/156.  CT head notable for 4.1 cm intracranial hemorrhage at the pons with intraventricular extension into the fourth ventricle and basal cisterns.  Initially admitted by critical care, and now extubated with tracheostomy, initially placed on 3/20, replaced on 01/05/2024.  PEG tube placed 11/08/2023.   Remains with poor response.  Stable on trach and PEG.  Disposition remains difficult given patient's need for PEG and tracheostomy tube care.  Currently awaiting disability.   Significant hospital events: 3/8: Intubated in ED, pontine bleed/SAH. CT head Acute large hemorrhage 4.1 cm in pons with intraventricular extension without hydrocephalus. 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  Can follow commands with eyes. 3/16 Purposeful with left upper extremity  3/22 tracheostomy placed at bedside, bleeding issues overnight from trach site 3/24 PEG 3/24 sputum culture with MRSA 4/20: trach was changed to cuffless #8 5/1: Mental status appears to be somewhat improving off narcotics, more awake alert following simple commands(able to grimace, move left toes, track vertically with right eye).  Speech following. Trach downsized to [employer] cuffless #6 XLT 5/4: Modafinil  held, low threshold to resume if mental status/ability to interact diminishes 5/10: PEG dislodged, foley bulb placed and IR consulted to replace tube 5/11: IR replaced PEG tube 5/13 - 5/14: + Fever.  Restarted antibiotics.  Urine culture + E. Coli, foley catheter changed. 5/17 Tracheostomy changed.   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  CT head with acute large hemorrhage 4.1 cm in the pons, ventricular extension. blood pressure 260/1 50s on admission. - Initially admitted to the intensive care unit under PCCM service and neurology following. - 2D echo ejection fraction 60%, A1c 4.7, LDL 96. - Was also treated with 3% hypertonic saline.  Follow-up CT head 3/29 with reduction in size of hemorrhage. -Patient now with trach collar and not a candidate for decannulation as per PCCM. -Patient now has a PEG tube and tolerating well. Speech therapy, PT OT is following.   Acute respiratory failure secondary to large pontine stroke brainstem compression Unable to protect airway/possible aspiration pneumonia - Now has trach collar.   PCCM intermittently following.  Recommended not a candidate for decannulation.  On 01/20/2024, patient started being tachycardic and then tachypneic and developed fever of 101.5.  Chest x-ray was done which was unremarkable however he was felt to have aspiration pneumonia clinically and since he has history of MRSA pneumonia in the past, he was started on vancomycin  and cefepime  empirically.  Procalcitonin unremarkable however that does not rule out pneumonia.  Patient did have leukocytosis as well.  I do agree that clinically he does appear to have pneumonia and thus I planned to continue dual antibiotics  for 7 days.  Eventually, her sputum culture is now growing MRSA yet again.  Continue  aspiration precautions.  Last temperature spike of 100.5 at 3 PM on 01/21/2024.   Hypertension: Blood pressure elevated at times mostly diastolic.  Continue amlodipine , metoprolol , but increased losartan  to 100 mg p.o. daily starting 01/23/2024.  Will monitor further 24 hours, if remains elevated, may consider adding another antihypertensive.   Transaminases: improving CT abdomen and pelvis 5/10: No focal liver abnormality seen, no gallstone, gallbladder wall thickening or biliary dilation - no significant medication that could cause transaminases. - acute viral hepatitis panel negative. - ammonia mildly elevated-treated with lactulose -ammonia is 26.    E. coli UTI completed antibiotics course 5/18 Foley catheter was changed 5/13.   Moderate to severe protein caloric malnutrition, POA Body max index 31 Obesity type I PEG tube in place, continue tube feedings per RD   PEG tube malfunction, resolved PEG dislodged 5/10, replaced on 5/11 by IR   Exposure keratopathy, OS Seen by ophthalmology, Dr. Mason Sole on 4/7.  Treated with maxitrol ointment (neo-poly-dex) TID OS for 1 week. -- Refresh PM TID to QID OS for chronic lubrication -- Can tape eyelid closed if still exposure an issue (plastic tape may work better than paper and would try to tape eyelid directly rather than with gauze to keep enough tension to ensure it isn't remaining open)   AKI with acute urinary retention Currently has a Foley catheter. Prior removals/voiding trial unsuccessful -- Last catheter change was on 5/13.   Macrocytic anemia thrombocytosis  stable/resolved   Perineal skin injury, POA  Continue Gerhard's Butt cream   Relative B12 and folate deficiency Continue replacement with MVI    Goal of care: Currently DNR with intervention.   Nutrition Problem: Inadequate oral intake Etiology: inability to eat   Interventions: Tube feeding, Prostat, MVI, Juven   Estimated body mass index is 33.6 kg/m as calculated  from the following:   Height as of this encounter: 5\' 8"  (1.727 m).   Weight as of this encounter: 100.2 kg.  DVT prophylaxis: enoxaparin  (LOVENOX ) injection 40 mg Start: 01/17/24 1000 SCDs Start: 10/27/23 2226   Code Status: Do not attempt resuscitation (DNR) PRE-ARREST INTERVENTIONS DESIRED  Family Communication:  None present at bedside.  Tried calling the wife, left voicemail. Addendum: Wife called back, spoke to her.  Answered questions that she had.  She was thankful.  Status is: Inpatient Remains inpatient appropriate because: Awaiting placement   Estimated body mass index is 29.83 kg/m as calculated from the following:   Height as of this encounter: 5\' 8"  (1.727 m).   Weight as of this encounter: 89 kg.    Nutritional Assessment: Body mass index is 29.83 kg/m.Aaron Aas Seen by dietician.  I agree with the assessment and plan as outlined below: Nutrition Status: Nutrition Problem: Inadequate oral intake Etiology: inability to eat Signs/Symptoms: NPO status Interventions: Tube feeding, Prostat, MVI, Juven  . Skin Assessment: I have examined the patient's skin and I agree with the wound assessment as performed by the wound care RN as outlined below:    Consultants:  Ophthalmology, general surgery, palliative care, critical care  Procedures:  As above  Antimicrobials:  Anti-infectives (From admission, onward)    Start     Dose/Rate Route Frequency Ordered Stop   01/22/24 2200  Vancomycin  (VANCOCIN ) 1,250 mg in sodium chloride  0.9 % 250 mL IVPB  Status:  Discontinued        1,250 mg 166.7 mL/hr over 90 Minutes  Intravenous Every 24 hours 01/22/24 1013 01/22/24 1020   01/22/24 2000  vancomycin  (VANCOREADY) IVPB 1250 mg/250 mL        1,250 mg 166.7 mL/hr over 90 Minutes Intravenous Every 24 hours 01/22/24 1020     01/20/24 2000  ceFEPIme  (MAXIPIME ) 2 g in sodium chloride  0.9 % 100 mL IVPB        2 g 200 mL/hr over 30 Minutes Intravenous Every 8 hours 01/20/24 1900      01/20/24 2000  Vancomycin  (VANCOCIN ) 1,250 mg in sodium chloride  0.9 % 250 mL IVPB  Status:  Discontinued        1,250 mg 166.7 mL/hr over 90 Minutes Intravenous Every 24 hours 01/20/24 1900 01/22/24 1013   01/03/24 1730  cefTRIAXone  (ROCEPHIN ) 1 g in sodium chloride  0.9 % 100 mL IVPB        1 g 200 mL/hr over 30 Minutes Intravenous Every 24 hours 01/03/24 1235 01/07/24 0710   01/02/24 1830  ceFEPIme  (MAXIPIME ) 2 g in sodium chloride  0.9 % 100 mL IVPB  Status:  Discontinued        2 g 200 mL/hr over 30 Minutes Intravenous Every 8 hours 01/02/24 1732 01/03/24 1235   01/02/24 1830  Vancomycin  (VANCOCIN ) 1,250 mg in sodium chloride  0.9 % 250 mL IVPB  Status:  Discontinued        1,250 mg 166.7 mL/hr over 90 Minutes Intravenous Every 24 hours 01/02/24 1732 01/04/24 1754   01/01/24 1200  cefTRIAXone  (ROCEPHIN ) 1 g in sodium chloride  0.9 % 100 mL IVPB  Status:  Discontinued        1 g 200 mL/hr over 30 Minutes Intravenous Every 24 hours 01/01/24 1057 01/02/24 1713   11/29/23 2200  linezolid  (ZYVOX ) tablet 600 mg        600 mg Per Tube Every 12 hours 11/29/23 1034 12/04/23 2215   11/28/23 1000  linezolid  (ZYVOX ) IVPB 600 mg  Status:  Discontinued        600 mg 300 mL/hr over 60 Minutes Intravenous Every 12 hours 11/28/23 0831 11/29/23 1034   11/23/23 1200  vancomycin  (VANCOREADY) IVPB 1250 mg/250 mL  Status:  Discontinued        1,250 mg 166.7 mL/hr over 90 Minutes Intravenous Every 24 hours 11/23/23 1036 11/28/23 0831   11/20/23 1100  vancomycin  (VANCOREADY) IVPB 1250 mg/250 mL  Status:  Discontinued        1,250 mg 166.7 mL/hr over 90 Minutes Intravenous Every 24 hours 11/20/23 0923 11/20/23 0923   11/20/23 1100  vancomycin  (VANCOREADY) IVPB 1250 mg/250 mL  Status:  Discontinued        1,250 mg 166.7 mL/hr over 90 Minutes Intravenous Every 24 hours 11/20/23 0923 11/23/23 1036   11/15/23 2300  vancomycin  (VANCOREADY) IVPB 1250 mg/250 mL        1,250 mg 166.7 mL/hr over 90 Minutes Intravenous  Every 24 hours 11/15/23 2215 11/19/23 0924   11/12/23 2200  vancomycin  (VANCOREADY) IVPB 750 mg/150 mL  Status:  Discontinued        750 mg 150 mL/hr over 60 Minutes Intravenous Every 12 hours 11/12/23 0855 11/15/23 2215   11/12/23 0930  vancomycin  (VANCOREADY) IVPB 2000 mg/400 mL        2,000 mg 200 mL/hr over 120 Minutes Intravenous  Once 11/12/23 0838 11/12/23 1116   11/11/23 1345  Ampicillin -Sulbactam (UNASYN ) 3 g in sodium chloride  0.9 % 100 mL IVPB  Status:  Discontinued        3 g  200 mL/hr over 30 Minutes Intravenous Every 6 hours 11/11/23 1257 11/12/23 0836   10/28/23 0400  Ampicillin -Sulbactam (UNASYN ) 3 g in sodium chloride  0.9 % 100 mL IVPB  Status:  Discontinued        3 g 200 mL/hr over 30 Minutes Intravenous Every 6 hours 10/27/23 2151 11/02/23 0945   10/27/23 1845  vancomycin  (VANCOCIN ) IVPB 1000 mg/200 mL premix  Status:  Discontinued        1,000 mg 200 mL/hr over 60 Minutes Intravenous  Once 10/27/23 1843 10/27/23 1843   10/27/23 1845  ceFEPIme  (MAXIPIME ) 2 g in sodium chloride  0.9 % 100 mL IVPB        2 g 200 mL/hr over 30 Minutes Intravenous  Once 10/27/23 1843 10/27/23 2024   10/27/23 1845  vancomycin  (VANCOREADY) IVPB 2000 mg/400 mL        2,000 mg 200 mL/hr over 120 Minutes Intravenous  Once 10/27/23 1843 10/27/23 2123         Subjective: Seen and examined.  Nonverbal.  Appears stable and comfortable.  Objective: Vitals:   01/24/24 0048 01/24/24 0308 01/24/24 0317 01/24/24 0321  BP: (!) 122/92   (!) 130/96  Pulse: 83 77  74  Resp: 20 20  20   Temp: 98.3 F (36.8 C)   97.6 F (36.4 C)  TempSrc: Oral   Axillary  SpO2: 99% 98%  99%  Weight:   89 kg   Height:        Intake/Output Summary (Last 24 hours) at 01/24/2024 0744 Last data filed at 01/24/2024 0610 Gross per 24 hour  Intake 1450 ml  Output 2751 ml  Net -1301 ml   Filed Weights   01/22/24 0500 01/23/24 0421 01/24/24 0317  Weight: 92 kg 92.3 kg 89 kg    Examination:  General exam:  Appears calm and comfortable, does not appear to be tachypneic today. Respiratory system: Faint at the bases bilaterally.  Has trach collar. Cardiovascular system: S1 & S2 heard, sinus tachycardia. No JVD, murmurs, rubs, gallops or clicks. No pedal edema. Gastrointestinal system: Abdomen is nondistended, soft and nontender. No organomegaly or masses felt. Normal bowel sounds heard. Central nervous system: Alert but unable to assess orientation due to being nonverbal.  Not following commands.  Data Reviewed: I have personally reviewed following labs and imaging studies  CBC: Recent Labs  Lab 01/20/24 2032 01/21/24 0936  WBC 14.8* 14.2*  NEUTROABS 11.1* 9.2*  HGB 13.2 12.4*  HCT 41.7 38.9*  MCV 91.4 91.3  PLT 534* 488*   Basic Metabolic Panel: Recent Labs  Lab 01/18/24 1503 01/20/24 2032 01/23/24 0530  NA 135 135 136  K 3.9 4.2 3.9  CL 98 98 103  CO2 26 25 20*  GLUCOSE 129* 122* 118*  BUN 19 19 21*  CREATININE 0.59* 0.66 0.46*  CALCIUM 9.3 9.1 9.3   GFR: Estimated Creatinine Clearance: 123.7 mL/min (A) (by C-G formula based on SCr of 0.46 mg/dL (L)). Liver Function Tests: No results for input(s): "AST", "ALT", "ALKPHOS", "BILITOT", "PROT", "ALBUMIN " in the last 168 hours.  No results for input(s): "LIPASE", "AMYLASE" in the last 168 hours. Recent Labs  Lab 01/18/24 1503 01/19/24 0430  AMMONIA 38* 26   Coagulation Profile: No results for input(s): "INR", "PROTIME" in the last 168 hours. Cardiac Enzymes: No results for input(s): "CKTOTAL", "CKMB", "CKMBINDEX", "TROPONINI" in the last 168 hours. BNP (last 3 results) No results for input(s): "PROBNP" in the last 8760 hours. HbA1C: No results for input(s): "HGBA1C" in  the last 72 hours. CBG: Recent Labs  Lab 01/23/24 1228 01/23/24 1702 01/23/24 2132 01/24/24 0316 01/24/24 0608  GLUCAP 164* 166* 102* 130* 103*   Lipid Profile: No results for input(s): "CHOL", "HDL", "LDLCALC", "TRIG", "CHOLHDL", "LDLDIRECT"  in the last 72 hours. Thyroid Function Tests: No results for input(s): "TSH", "T4TOTAL", "FREET4", "T3FREE", "THYROIDAB" in the last 72 hours. Anemia Panel: No results for input(s): "VITAMINB12", "FOLATE", "FERRITIN", "TIBC", "IRON", "RETICCTPCT" in the last 72 hours. Sepsis Labs: Recent Labs  Lab 01/20/24 2032  PROCALCITON 0.16    Recent Results (from the past 240 hours)  Culture, blood (Routine X 2) w Reflex to ID Panel     Status: None (Preliminary result)   Collection Time: 01/20/24  8:32 PM   Specimen: BLOOD  Result Value Ref Range Status   Specimen Description BLOOD SITE NOT SPECIFIED  Final   Special Requests   Final    BOTTLES DRAWN AEROBIC AND ANAEROBIC Blood Culture results may not be optimal due to an inadequate volume of blood received in culture bottles   Culture   Final    NO GROWTH 3 DAYS Performed at [facility], [address] [place], [name] [address]    Report Status PENDING  Incomplete  Culture, blood (Routine X 2) w Reflex to ID Panel     Status: None (Preliminary result)   Collection Time: 01/20/24  8:32 PM   Specimen: BLOOD  Result Value Ref Range Status   Specimen Description BLOOD SITE NOT SPECIFIED  Final   Special Requests   Final    BOTTLES DRAWN AEROBIC AND ANAEROBIC Blood Culture results may not be optimal due to an inadequate volume of blood received in culture bottles   Culture   Final    NO GROWTH 3 DAYS Performed at [facility], [address], [place], [name] [address]    Report Status PENDING  Incomplete  Respiratory (~20 pathogens) panel by PCR     Status: None   Collection Time: 01/20/24  8:40 PM   Specimen: Nasopharyngeal Swab; Respiratory  Result Value Ref Range Status   Adenovirus NOT DETECTED NOT DETECTED Final   Coronavirus 229E NOT DETECTED NOT DETECTED Final    Comment: (NOTE) The Coronavirus on the Respiratory Panel, DOES NOT test for the novel  Coronavirus (2019 nCoV)    Coronavirus HKU1 NOT DETECTED NOT  DETECTED Final   Coronavirus NL63 NOT DETECTED NOT DETECTED Final   Coronavirus OC43 NOT DETECTED NOT DETECTED Final   Metapneumovirus NOT DETECTED NOT DETECTED Final   Rhinovirus / Enterovirus NOT DETECTED NOT DETECTED Final   Influenza A NOT DETECTED NOT DETECTED Final   Influenza B NOT DETECTED NOT DETECTED Final   Parainfluenza Virus 1 NOT DETECTED NOT DETECTED Final   Parainfluenza Virus 2 NOT DETECTED NOT DETECTED Final   Parainfluenza Virus 3 NOT DETECTED NOT DETECTED Final   Parainfluenza Virus 4 NOT DETECTED NOT DETECTED Final   Respiratory Syncytial Virus NOT DETECTED NOT DETECTED Final   Bordetella pertussis NOT DETECTED NOT DETECTED Final   Bordetella Parapertussis NOT DETECTED NOT DETECTED Final   Chlamydophila pneumoniae NOT DETECTED NOT DETECTED Final   Mycoplasma pneumoniae NOT DETECTED NOT DETECTED Final    Comment: Performed at [facility], [address] [place], [name] [address]  Resp panel by RT-PCR (RSV, Flu A&B, Covid) Anterior Nasal Swab     Status: None   Collection Time: 01/20/24  8:40 PM   Specimen: Anterior Nasal Swab  Result Value Ref Range Status   SARS Coronavirus 2 by RT PCR NEGATIVE NEGATIVE Final   Influenza A by PCR NEGATIVE NEGATIVE Final   Influenza B by PCR NEGATIVE NEGATIVE Final    Comment: (NOTE) The Xpert Xpress SARS-CoV-2/FLU/RSV plus assay is intended as an aid in the diagnosis of influenza from Nasopharyngeal swab specimens and should not be used as a sole basis for treatment. Nasal washings and aspirates are unacceptable for Xpert Xpress SARS-CoV-2/FLU/RSV testing.  Fact Sheet for Patients: BloggerCourse.com  Fact Sheet for Healthcare Providers: SeriousBroker.it  This test is not yet approved or cleared by the [place]  FDA and has been authorized for detection and/or diagnosis of SARS-CoV-2 by FDA under an Emergency Use Authorization (EUA). This EUA will remain in  effect (meaning this test can be used) for the duration of the COVID-19 declaration under Section 564(b)(1) of the Act, 21 U.S.C. section 360bbb-3(b)(1), unless the authorization is terminated or revoked.     Resp Syncytial Virus by PCR NEGATIVE NEGATIVE Final    Comment: (NOTE) Fact Sheet for Patients: BloggerCourse.com  Fact Sheet for Healthcare Providers: SeriousBroker.it  This test is not yet approved or cleared by the [place]  FDA and has been authorized for detection and/or diagnosis of SARS-CoV-2 by FDA under an Emergency Use Authorization (EUA). This EUA will remain in effect (meaning this test can be used) for the duration of the COVID-19 declaration under Section 564(b)(1) of the Act, 21 U.S.C. section 360bbb-3(b)(1), unless the authorization is terminated or revoked.  Performed at [facility], [address], [place], [name] [address]   Culture, Respiratory w Gram Stain     Status: None   Collection Time: 01/21/24  5:55 AM   Specimen: Tracheal Aspirate  Result Value Ref Range Status   Specimen Description TRACHEAL ASPIRATE  Final   Special Requests NONE  Final   Gram Stain   Final    FEW WBC PRESENT, PREDOMINANTLY PMN FEW GRAM NEGATIVE RODS FEW GRAM POSITIVE COCCI RARE GRAM POSITIVE RODS Performed at [facility], [address], [place], [name] [address]    Culture   Final    MODERATE METHICILLIN RESISTANT STAPHYLOCOCCUS AUREUS   Report Status 01/23/2024 FINAL  Final   Organism ID, Bacteria METHICILLIN RESISTANT STAPHYLOCOCCUS AUREUS  Final      Susceptibility   Methicillin resistant staphylococcus aureus - MIC*    CIPROFLOXACIN <=0.5 SENSITIVE Sensitive     ERYTHROMYCIN  >=8 RESISTANT Resistant     GENTAMICIN <=0.5 SENSITIVE Sensitive     OXACILLIN >=4 RESISTANT Resistant     TETRACYCLINE >=16 RESISTANT Resistant     VANCOMYCIN  <=0.5 SENSITIVE Sensitive     TRIMETH/SULFA <=10 SENSITIVE  Sensitive     CLINDAMYCIN <=0.25 SENSITIVE Sensitive     RIFAMPIN <=0.5 SENSITIVE Sensitive     Inducible Clindamycin NEGATIVE Sensitive     LINEZOLID  2 SENSITIVE Sensitive     * MODERATE METHICILLIN RESISTANT STAPHYLOCOCCUS AUREUS     Radiology Studies: No results found.   Scheduled Meds:  amLODipine   10 mg Per Tube Daily   artificial tears   Left Eye Q8H   Chlorhexidine  Gluconate Cloth  6 each Topical Daily   enoxaparin  (LOVENOX ) injection  40 mg Subcutaneous Daily   famotidine   20 mg Per Tube BID   feeding supplement (OSMOLITE 1.5 CAL)  237 mL Per Tube 5 X Daily   feeding supplement (PROSource TF20)  60 mL Per Tube TID   free water   150 mL  Per Tube Q4H   insulin  aspart  0-15 Units Subcutaneous 5 times per day   losartan   100 mg Per Tube Daily   metoprolol  tartrate  100 mg Per Tube BID   multivitamin with minerals  1 tablet Per Tube Daily   mouth rinse  15 mL Mouth Rinse 4 times per day   Continuous Infusions:  ceFEPime  (MAXIPIME ) IV 2 g (01/24/24 0313)   vancomycin  1,250 mg (01/23/24 2043)     LOS: 89 days   Modena Andes, MD [facility]  01/24/2024, 7:44 AM   *Please note that this is a verbal dictation therefore any spelling or grammatical errors are due to the "Dragon Medical One" system interpretation.  Please page via Amion and do not message via secure chat for urgent patient care matters. Secure chat can be used for non urgent patient care matters.  How to contact the [facility] Attending or Consulting provider 7A - 7P or covering provider during after hours 7P -7A, for this patient?  Check the care team in [facility] and look for a) attending/consulting [facility] provider listed and b) the [facility] team listed. Page or secure chat 7A-7P. Log into [email or web address] and use Media's universal password to access. If you do not have the password, please contact the hospital operator. Locate the [facility] provider you are looking for under [facility] and page to a number that you  can be directly reached. If you still have difficulty reaching the provider, please page the [facility] (Director on Call) for the Hospitalists listed on amion for assistance.

## 2024-01-24 NOTE — Progress Notes (Signed)
 Physical Therapy Treatment Patient Details Name: Garrett [name] MRN: [id number] DOB: 27-Nov-1975 Today's Date: 01/24/2024   History of Present Illness Pt is a 48 yo male who was found down 10/27/23 with agonal breathing. 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 Intubated 3/8, cortrak placed 3/14, trach placed 3/22, PEG placed 3/24. PMH: HTN, smoker    PT Comments  Patient able to communicate both with head nods and with communication board though not consistently today with head nods.  Working in sitting on head rotation and pt consistently able to rotated L though needing A for R rotation.  L hand movements consistent though needing A to initiate from shoulder.  Patient tolerating up in chair >1 hour today.  Remains appropriate for post-acute inpatient rehab (<3 hours/day).  PT will continue to follow in the acute setting.     If plan is discharge home, recommend the following: Two people to help with walking and/or transfers;Two people to help with bathing/dressing/bathroom;Assistance with cooking/housework;Assistance with feeding;Direct supervision/assist for medications management;Direct supervision/assist for financial management;Assist for transportation;Help with stairs or ramp for entrance;Supervision due to cognitive status   Can travel by private vehicle        Equipment Recommendations  [place] lift;Hospital bed;Wheelchair (measurements PT);Wheelchair cushion (measurements PT);BSC/3in1;Other (comment)    Recommendations for Other Services       Precautions / Restrictions Precautions Precautions: Fall Recall of Precautions/Restrictions: Impaired Precaution/Restrictions Comments: trach, PEG, abdominal binder, SBP < 160 Required Braces or Orthoses: Other Brace Splint/Cast: Bil resting hand splints Other Brace: has  bilateral prevalon boots     Mobility  Bed Mobility Overal bed mobility: Needs Assistance Bed Mobility: Rolling Rolling: +2 for physical assistance, +2 for safety/equipment, Total assist              Transfers Overall transfer level: Needs assistance   Transfers: Bed to chair/wheelchair/BSC             General transfer comment: up to recliner via lift and positioned for safety and skin protection; requesting back to bed so also assisted back to bed for safety and to avoid skin breakdown Transfer via Lift Equipment: [place]  Ambulation/Gait                   Stairs             Wheelchair Mobility     Tilt Bed    Modified Rankin (Stroke Patients Only) Modified Rankin (Stroke Patients Only) Pre-Morbid Rankin Score: No symptoms Modified Rankin: Severe disability     Balance     Sitting balance-Leahy Scale: Zero Sitting balance - Comments: A for positioning in chair (blanket at front of chair under cushion for safety and keeping from sliding out; pillows to each shoulder and positioning for head and neck support                                    Communication Communication Communication: Impaired Factors Affecting Communication: Trach/intubated  Cognition Arousal: Alert Behavior During Therapy: Flat affect     Difficult to assess due to: Tracheostomy, Impaired communication                     PT - Cognition Comments: using head nods intermittently (for OOB) though also today using "yes"/"no" board  for indicating back to bed   Following commands impaired: Follows one step commands inconsistently, Follows one step commands with increased time    Cueing Cueing Techniques: Verbal cues, Visual cues  Exercises Other Exercises Other Exercises: PROM bilat LE's in supine Other Exercises: seated L arm slides with max cues and min A x ~3 reps Other Exercises: seated head rotation AAROM to R and S to L    General  Comments General comments (skin integrity, edema, etc.): 28% trach collar throughout with VSS; once back in bed, significant other arrived      Pertinent Vitals/Pain Pain Assessment Pain Assessment: Faces Faces Pain Scale: No hurt    Home Living                          Prior Function            PT Goals (current goals can now be found in the care plan section) Progress towards PT goals: Progressing toward goals    Frequency    Min 2X/week      PT Plan      Co-evaluation              AM-PAC PT "6 Clicks" Mobility   Outcome Measure  Help needed turning from your back to your side while in a flat bed without using bedrails?: Total Help needed moving from lying on your back to sitting on the side of a flat bed without using bedrails?: Total Help needed moving to and from a bed to a chair (including a wheelchair)?: Total Help needed standing up from a chair using your arms (e.g., wheelchair or bedside chair)?: Total Help needed to walk in hospital room?: Total Help needed climbing 3-5 steps with a railing? 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

## 2024-01-24 NOTE — Plan of Care (Signed)
  Problem: Education: Goal: Knowledge of secondary prevention will improve (MUST DOCUMENT ALL) Outcome: Progressing Goal: Knowledge of patient specific risk factors will improve (DELETE if not current risk factor) Outcome: Progressing   Problem: Intracerebral Hemorrhage Tissue Perfusion: Goal: Complications of Intracerebral Hemorrhage will be minimized Outcome: Progressing   Problem: Coping: Goal: Will verbalize positive feelings about self Outcome: Progressing

## 2024-01-25 DIAGNOSIS — I612 Nontraumatic intracerebral hemorrhage in hemisphere, unspecified: Secondary | ICD-10-CM | POA: Diagnosis not present

## 2024-01-25 DIAGNOSIS — I613 Nontraumatic intracerebral hemorrhage in brain stem: Secondary | ICD-10-CM | POA: Diagnosis not present

## 2024-01-25 LAB — CBC
HCT: 36.3 % — ABNORMAL LOW (ref 39.0–52.0)
Hemoglobin: 11.5 g/dL — ABNORMAL LOW (ref 13.0–17.0)
MCH: 28.8 pg (ref 26.0–34.0)
MCHC: 31.7 g/dL (ref 30.0–36.0)
MCV: 91 fL (ref 80.0–100.0)
Platelets: 546 10*3/uL — ABNORMAL HIGH (ref 150–400)
RBC: 3.99 MIL/uL — ABNORMAL LOW (ref 4.22–5.81)
RDW: 14.3 % (ref 11.5–15.5)
WBC: 8.2 10*3/uL (ref 4.0–10.5)
nRBC: 0 % (ref 0.0–0.2)

## 2024-01-25 LAB — BASIC METABOLIC PANEL WITH GFR
Anion gap: 10 (ref 5–15)
BUN: 19 mg/dL (ref 6–20)
CO2: 25 mmol/L (ref 22–32)
Calcium: 9.2 mg/dL (ref 8.9–10.3)
Chloride: 102 mmol/L (ref 98–111)
Creatinine, Ser: 0.41 mg/dL — ABNORMAL LOW (ref 0.61–1.24)
GFR, Estimated: >60 mL/min (ref >60)
Glucose, Bld: 116 mg/dL — ABNORMAL HIGH (ref 70–99)
Potassium: 3.6 mmol/L (ref 3.5–5.1)
Sodium: 137 mmol/L (ref 135–145)

## 2024-01-25 LAB — CULTURE, BLOOD (ROUTINE X 2)
Culture: NO GROWTH
Culture: NO GROWTH

## 2024-01-25 LAB — GLUCOSE, CAPILLARY
Glucose-Capillary: 114 mg/dL — ABNORMAL HIGH (ref 70–99)
Glucose-Capillary: 139 mg/dL — ABNORMAL HIGH (ref 70–99)
Glucose-Capillary: 163 mg/dL — ABNORMAL HIGH (ref 70–99)

## 2024-01-25 LAB — MAGNESIUM: Magnesium: 2 mg/dL (ref 1.7–2.4)

## 2024-01-25 MED ORDER — HYDRALAZINE HCL 20 MG/ML IJ SOLN
10.0000 mg | INTRAMUSCULAR | Status: DC | PRN
  Filled 2024-01-25: qty 1

## 2024-01-25 MED ORDER — METOPROLOL TARTRATE 5 MG/5ML IV SOLN
5.0000 mg | INTRAVENOUS | Status: DC | PRN
  Administered 2024-01-29 – 2024-02-12 (×2): 5 mg via INTRAVENOUS
  Filled 2024-01-25 (×2): qty 5

## 2024-01-25 NOTE — Plan of Care (Signed)
  Problem: Education: Goal: Knowledge of disease or condition will improve Outcome: Not Progressing Goal: Knowledge of secondary prevention will improve (MUST DOCUMENT ALL) Outcome: Not Progressing Goal: Knowledge of patient specific risk factors will improve (DELETE if not current risk factor) Outcome: Not Progressing   Problem: Intracerebral Hemorrhage Tissue Perfusion: Goal: Complications of Intracerebral Hemorrhage will be minimized Outcome: Progressing

## 2024-01-25 NOTE — Progress Notes (Signed)
 PROGRESS NOTE    Garrett [name]  RUE:[id number] DOB: August 28, 1975 DOA: 10/27/2023 PCP: Pcp, No    Brief Narrative:  48 year old with no known previous medical history was found down on 10/27/2023, unresponsive with UDS positive for opioids.  He was minimally responsive to Narcan  and he was found to be markedly hypertensive-  blood pressure 262/156.  CT head notable for 4.1 cm intracranial hemorrhage at the pons with intraventricular extension into the fourth ventricle and basal cisterns.  Initially admitted by critical care, and now extubated with tracheostomy, initially placed on 3/20, replaced on 01/05/2024.  PEG tube placed 11/08/2023.   Remains with poor response.  Stable on trach and PEG.  Disposition remains difficult given patient's need for PEG and tracheostomy tube care.  Currently awaiting disability.   Significant hospital events: 3/8: Intubated in ED, pontine bleed/SAH. CT head Acute large hemorrhage 4.1 cm in pons with intraventricular extension without hydrocephalus. 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  Can follow commands with eyes. 3/16 Purposeful with left upper extremity  3/22 tracheostomy placed at bedside, bleeding issues overnight from trach site 3/24 PEG 3/24 sputum culture with MRSA 4/20: trach was changed to cuffless #8 5/1: Mental status appears to be somewhat improving off narcotics, more awake alert following simple commands(able to grimace, move left toes, track vertically with right eye).  Speech following. Trach downsized to [employer] cuffless #6 XLT 5/4: Modafinil  held, low threshold to resume if mental status/ability to interact diminishes 5/10: PEG dislodged, foley bulb placed and IR consulted to replace tube 5/11: IR replaced PEG tube 5/13 - 5/14: + Fever.  Restarted antibiotics.  Urine culture + E. Coli, foley catheter changed. 5/17 Tracheostomy changed.   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  CT head with acute large hemorrhage 4.1 cm in the pons, ventricular extension. blood pressure 260/1 50s on admission. - Initially admitted to the intensive care unit under PCCM service and neurology following. - 2D echo ejection fraction 60%, A1c 4.7, LDL 96. - Was also treated with 3% hypertonic saline.  Follow-up CT head 3/29 with reduction in size of hemorrhage. -Patient now has trach and PEG in place. Speech therapy, PT OT is following.   Acute respiratory failure secondary to large pontine stroke brainstem compression Unable to protect airway/possible aspiration pneumonia - Now has trach collar.   PCCM intermittently following.  Recommended not a candidate for decannulation.  On 6/1 became tachycardic, tachypneic and febrile.  Chest x-ray unremarkable and procalcitonin was negative.  Initially started on vancomycin  and cefepime  later transitioned to vancomycin  as sputum cultures grew MRSA   Hypertension: Blood pressure improved today.  Continue Norvasc , losartan , Lopressor    Transaminases: Improved.  CT abdomen pelvis negative, acute hepatitis panel negative.    E. coli UTI  completed antibiotics course 5/18 Foley catheter was changed 5/13.  Should change it again on 612/25   Moderate to severe protein caloric malnutrition, POA Body max index 31 Obesity type I PEG tube in  place, continue tube feedings per RD   PEG tube malfunction, resolved PEG  dislodged 5/10, replaced on 5/11 by IR   Exposure keratopathy, OS Seen by ophthalmology, Dr. Mason Sole on 4/7.  Treated with maxitrol ointment (neo-poly-dex) TID OS for 1 week. -- Refresh PM TID to QID OS for chronic lubrication -- Can tape eyelid closed if still exposure an issue (plastic tape may work better than paper and would try to tape eyelid directly rather than with gauze to keep enough tension to ensure it isn't remaining open)   AKI with acute urinary retention Resolved.  Has Foley in place   Macrocytic anemia thrombocytosis  stable/resolved   Perineal skin injury, POA  Continue Gerhard's Butt cream   Relative B12 and folate deficiency Continue replacement with MVI    Goal of care: Currently DNR with intervention.   Nutrition Problem: Inadequate oral intake Etiology: inability to eat   Interventions: Tube feeding, Prostat, MVI, Juven   Estimated body mass index is 33.6 kg/m as calculated from the following:   Height as of this encounter: 5\' 8"  (1.727 m).   Weight as of this encounter: 100.2 kg.   DVT prophylaxis: enoxaparin  (LOVENOX ) injection 40 mg Start: 01/17/24 1000 SCDs Start: 10/27/23 2226   Code Status: Do not attempt resuscitation (DNR) PRE-ARREST INTERVENTIONS DESIRED  Family Communication:     Status is: Inpatient Remains inpatient appropriate because: Awaiting placement     Estimated body mass index is 29.83 kg/m as calculated from the following:   Height as of this encounter: 5\' 8"  (1.727 m).   Weight as of this encounter: 89 kg.   Nutritional Assessment: Body mass index is 29.83 kg/m.Garrett [name] Seen by dietician.  I agree with the assessment and plan as outlined below: Nutrition Status: Nutrition Problem: Inadequate oral intake Etiology: inability to eat Signs/Symptoms: NPO status Interventions: Tube feeding, Prostat, MVI, Juven   . Skin Assessment: I have examined the patient's skin and I agree with the wound assessment as performed by the wound  care RN as outlined below:   Consultants:  Ophthalmology, general surgery, palliative care, critical care   Subjective:  Seen at bedside, no complaints. Still having some secretions from his trach site  Examination:  General exam: Appears calm and comfortable  Respiratory system: Clear to auscultation. Respiratory effort normal. Cardiovascular system: S1 & S2 heard, RRR. No JVD, murmurs, rubs, gallops or clicks. No pedal edema. Gastrointestinal system: Abdomen is nondistended, soft and nontender. No organomegaly or masses felt. Normal bowel sounds heard. Central nervous system: Overall alert but difficult to assess as patient is nonverbal.  Does not really follow commands Skin: No rashes, lesions or ulcers Psychiatry: Unable to assess Peg tube in place Foley catheter in place Tracheostomy in place               Diet Orders (From admission, onward)     Start     Ordered   11/10/23 1245  Diet NPO time specified  (Pre Percutaneous Dilitation Tracheostomy)  Diet effective now       Comments: Clarify time of tube feed discontinuation with provider.   11/10/23 1245            Objective: Vitals:   01/25/24 0318 01/25/24 0346 01/25/24 0500 01/25/24 0800  BP:  118/87  (!) 134/97  Pulse:  84  94  Resp:  16  16  Temp:  98.2 F (36.8 C)  98.2 F (36.8 C)  TempSrc:  Axillary  Axillary  SpO2: 98% 97%  98%  Weight:   91 kg   Height:        Intake/Output Summary (Last 24 hours) at 01/25/2024 0902 Last data filed at 01/25/2024 0052 Gross per 24 hour  Intake 1311 ml  Output 1725 ml  Net -414 ml   Filed Weights   01/23/24 0421 01/24/24 0317 01/25/24 0500  Weight: 92.3 kg 89 kg 91 kg    Scheduled Meds:  amLODipine   10 mg Per Tube Daily   artificial tears   Left Eye Q8H   Chlorhexidine  Gluconate Cloth  6 each Topical Daily   enoxaparin  (LOVENOX ) injection  40 mg Subcutaneous Daily   famotidine   20 mg Per Tube BID   feeding supplement (OSMOLITE 1.5 CAL)  237 mL  Per Tube 5 X Daily   feeding supplement (PROSource TF20)  60 mL Per Tube TID   free water   150 mL Per Tube Q4H   insulin  aspart  0-15 Units Subcutaneous 5 times per day   losartan   100 mg Per Tube Daily   metoprolol  tartrate  100 mg Per Tube BID   multivitamin with minerals  1 tablet Per Tube Daily   mouth rinse  15 mL Mouth Rinse 4 times per day   Continuous Infusions:  vancomycin  Stopped (01/24/24 2352)    Nutritional status Signs/Symptoms: NPO status Interventions: Tube feeding, Prostat, MVI, Juven Body mass index is 30.5 kg/m.  Data Reviewed:   CBC: Recent Labs  Lab 01/20/24 2032 01/21/24 0936 01/24/24 1026  WBC 14.8* 14.2* 6.2  NEUTROABS 11.1* 9.2* 3.8  HGB 13.2 12.4* 12.1*  HCT 41.7 38.9* 39.0  MCV 91.4 91.3 92.4  PLT 534* 488* 552*   Basic Metabolic Panel: Recent Labs  Lab 01/18/24 1503 01/20/24 2032 01/23/24 0530 01/24/24 1026  NA 135 135 136 138  K 3.9 4.2 3.9 3.9  CL 98 98 103 101  CO2 26 25 20* 24  GLUCOSE 129* 122* 118* 108*  BUN 19 19 21* 20  CREATININE 0.59* 0.66 0.46* 0.64  CALCIUM 9.3 9.1 9.3 9.6   GFR: Estimated Creatinine Clearance: 125 mL/min (by C-G formula based on SCr of 0.64 mg/dL). Liver Function Tests: No results for input(s): "AST", "ALT", "ALKPHOS", "BILITOT", "PROT", "ALBUMIN " in the last 168 hours. No results for input(s): "LIPASE", "AMYLASE" in the last 168 hours. Recent Labs  Lab 01/18/24 1503 01/19/24 0430  AMMONIA 38* 26   Coagulation Profile: No results for input(s): "INR", "PROTIME" in the last 168 hours. Cardiac Enzymes: No results for input(s): "CKTOTAL", "CKMB", "CKMBINDEX", "TROPONINI" in the last 168 hours. BNP (last 3 results) No results for input(s): "PROBNP" in the last 8760 hours. HbA1C: No results for input(s): "HGBA1C" in the last 72 hours. CBG: Recent Labs  Lab 01/24/24 1118 01/24/24 1400 01/24/24 1728 01/24/24 2155 01/25/24 0510  GLUCAP 99 147* 127* 106* 114*   Lipid Profile: No results for  input(s): "CHOL", "HDL", "LDLCALC", "TRIG", "CHOLHDL", "LDLDIRECT" in the last 72 hours. Thyroid Function Tests: No results for input(s): "TSH", "T4TOTAL", "FREET4", "T3FREE", "THYROIDAB" in the last 72 hours. Anemia Panel: No results for input(s): "VITAMINB12", "FOLATE", "FERRITIN", "TIBC", "IRON", "RETICCTPCT" in the last 72 hours. Sepsis Labs: Recent Labs  Lab 01/20/24 2032  PROCALCITON 0.16    Recent Results (from the past 240 hours)  Culture, blood (Routine X 2) w Reflex to ID Panel     Status: None   Collection Time: 01/20/24  8:32 PM   Specimen: BLOOD  Result Value Ref Range Status   Specimen Description  BLOOD SITE NOT SPECIFIED  Final   Special Requests   Final    BOTTLES DRAWN AEROBIC AND ANAEROBIC Blood Culture results may not be optimal due to an inadequate volume of blood received in culture bottles   Culture   Final    NO GROWTH 5 DAYS Performed at [facility], [address] [place], [name] [address]    Report Status 01/25/2024 FINAL  Final  Culture, blood (Routine X 2) w Reflex to ID Panel     Status: None   Collection Time: 01/20/24  8:32 PM   Specimen: BLOOD  Result Value Ref Range Status   Specimen Description BLOOD SITE NOT SPECIFIED  Final   Special Requests   Final    BOTTLES DRAWN AEROBIC AND ANAEROBIC Blood Culture results may not be optimal due to an inadequate volume of blood received in culture bottles   Culture   Final    NO GROWTH 5 DAYS Performed at [facility], [address] [place], [name] [address]    Report Status 01/25/2024 FINAL  Final  Respiratory (~20 pathogens) panel by PCR     Status: None   Collection Time: 01/20/24  8:40 PM   Specimen: Nasopharyngeal Swab; Respiratory  Result Value Ref Range Status   Adenovirus NOT DETECTED NOT DETECTED Final   Coronavirus 229E NOT DETECTED NOT DETECTED Final    Comment: (NOTE) The Coronavirus on the Respiratory Panel, DOES NOT test for the novel  Coronavirus (2019 nCoV)     Coronavirus HKU1 NOT DETECTED NOT DETECTED Final   Coronavirus NL63 NOT DETECTED NOT DETECTED Final   Coronavirus OC43 NOT DETECTED NOT DETECTED Final   Metapneumovirus NOT DETECTED NOT DETECTED Final   Rhinovirus / Enterovirus NOT DETECTED NOT DETECTED Final   Influenza A NOT DETECTED NOT DETECTED Final   Influenza B NOT DETECTED NOT DETECTED Final   Parainfluenza Virus 1 NOT DETECTED NOT DETECTED Final   Parainfluenza Virus 2 NOT DETECTED NOT DETECTED Final   Parainfluenza Virus 3 NOT DETECTED NOT DETECTED Final   Parainfluenza Virus 4 NOT DETECTED NOT DETECTED Final   Respiratory Syncytial Virus NOT DETECTED NOT DETECTED Final   Bordetella pertussis NOT DETECTED NOT DETECTED Final   Bordetella Parapertussis NOT DETECTED NOT DETECTED Final   Chlamydophila pneumoniae NOT DETECTED NOT DETECTED Final   Mycoplasma pneumoniae NOT DETECTED NOT DETECTED Final    Comment: Performed at [facility], [address], [place], [name] [address]  Resp panel by RT-PCR (RSV, Flu A&B, Covid) Anterior Nasal Swab     Status: None   Collection Time: 01/20/24  8:40 PM   Specimen: Anterior Nasal Swab  Result Value Ref Range Status   SARS Coronavirus 2 by RT PCR NEGATIVE NEGATIVE Final   Influenza A by PCR NEGATIVE NEGATIVE Final   Influenza B by PCR NEGATIVE NEGATIVE Final    Comment: (NOTE) The Xpert Xpress SARS-CoV-2/FLU/RSV plus assay is intended as an aid in the diagnosis of influenza from Nasopharyngeal swab specimens and should not be used as a sole basis for treatment. Nasal washings and aspirates are unacceptable for Xpert Xpress SARS-CoV-2/FLU/RSV testing.  Fact Sheet for Patients: BloggerCourse.com  Fact Sheet for Healthcare Providers: SeriousBroker.it  This test is not yet approved or cleared by the [place]  FDA and has been authorized for detection and/or diagnosis of SARS-CoV-2 by FDA under an Emergency Use Authorization  (EUA). This EUA will remain in effect (meaning this test can be used) for the  duration of the COVID-19 declaration under Section 564(b)(1) of the Act, 21 U.S.C. section 360bbb-3(b)(1), unless the authorization is terminated or revoked.     Resp Syncytial Virus by PCR NEGATIVE NEGATIVE Final    Comment: (NOTE) Fact Sheet for Patients: BloggerCourse.com  Fact Sheet for Healthcare Providers: SeriousBroker.it  This test is not yet approved or cleared by the [place]  FDA and has been authorized for detection and/or diagnosis of SARS-CoV-2 by FDA under an Emergency Use Authorization (EUA). This EUA will remain in effect (meaning this test can be used) for the duration of the COVID-19 declaration under Section 564(b)(1) of the Act, 21 U.S.C. section 360bbb-3(b)(1), unless the authorization is terminated or revoked.  Performed at [facility], [address] [place], [name] [address]   Culture, Respiratory w Gram Stain     Status: None   Collection Time: 01/21/24  5:55 AM   Specimen: Tracheal Aspirate  Result Value Ref Range Status   Specimen Description TRACHEAL ASPIRATE  Final   Special Requests NONE  Final   Gram Stain   Final    FEW WBC PRESENT, PREDOMINANTLY PMN FEW GRAM NEGATIVE RODS FEW GRAM POSITIVE COCCI RARE GRAM POSITIVE RODS Performed at [facility], [address], [place], [name] [address]    Culture   Final    MODERATE METHICILLIN RESISTANT STAPHYLOCOCCUS AUREUS   Report Status 01/23/2024 FINAL  Final   Organism ID, Bacteria METHICILLIN RESISTANT STAPHYLOCOCCUS AUREUS  Final      Susceptibility   Methicillin resistant staphylococcus aureus - MIC*    CIPROFLOXACIN <=0.5 SENSITIVE Sensitive     ERYTHROMYCIN  >=8 RESISTANT Resistant     GENTAMICIN <=0.5 SENSITIVE Sensitive     OXACILLIN >=4 RESISTANT Resistant     TETRACYCLINE >=16 RESISTANT Resistant     VANCOMYCIN  <=0.5 SENSITIVE Sensitive      TRIMETH/SULFA <=10 SENSITIVE Sensitive     CLINDAMYCIN <=0.25 SENSITIVE Sensitive     RIFAMPIN <=0.5 SENSITIVE Sensitive     Inducible Clindamycin NEGATIVE Sensitive     LINEZOLID  2 SENSITIVE Sensitive     * MODERATE METHICILLIN RESISTANT STAPHYLOCOCCUS AUREUS         Radiology Studies: No results found.         LOS: 90 days   Time spent= 35 mins    Maggie Schooner, MD [facility]  If 7PM-7AM, please contact night-coverage  01/25/2024, 9:02 AM

## 2024-01-25 NOTE — TOC Progression Note (Signed)
 Transition of Care [facility]) - Progression Note    Patient Details  Name: Garrett [name] MRN: [id number] Date of Birth: 08-06-1976  Transition of Care [place]) CM/SW Contact  Tandy Fam, [name] Phone Number: 01/25/2024, 2:22 PM  Clinical Narrative:   CSW continuing to follow for placement. Disability is pending at this time.    Expected Discharge Plan: Long Term Nursing Home Barriers to Discharge: Continued Medical Work up, SNF Pending Medicaid, SNF Pending bed offer, SNF Pending payor source - LOG, Inadequate or no insurance (New trach)  Expected Discharge Plan and Services In-house Referral: Clinical Social Work, Hospice / Palliative Care   Post Acute Care Choice: Skilled Nursing Facility Living arrangements for the past 2 months: Single Family Home                                       Social Determinants of Health (SDOH) Interventions SDOH Screenings   Food Insecurity: Patient Unable To Answer (10/29/2023)  Social Connections: Unknown (06/23/2023)   Received from [facility]  Tobacco Use: High Risk (10/31/2023)    Readmission Risk Interventions     No data to display

## 2024-01-26 DIAGNOSIS — I612 Nontraumatic intracerebral hemorrhage in hemisphere, unspecified: Secondary | ICD-10-CM | POA: Diagnosis not present

## 2024-01-26 DIAGNOSIS — I613 Nontraumatic intracerebral hemorrhage in brain stem: Secondary | ICD-10-CM | POA: Diagnosis not present

## 2024-01-26 LAB — GLUCOSE, CAPILLARY
Glucose-Capillary: 102 mg/dL — ABNORMAL HIGH (ref 70–99)
Glucose-Capillary: 116 mg/dL — ABNORMAL HIGH (ref 70–99)
Glucose-Capillary: 107 mg/dL — ABNORMAL HIGH (ref 70–99)
Glucose-Capillary: 98 mg/dL (ref 70–99)
Glucose-Capillary: 94 mg/dL (ref 70–99)

## 2024-01-26 NOTE — Progress Notes (Signed)
 PROGRESS NOTE    Garrett [name]  ZOX:[id number] DOB: 02/25/1976 DOA: 10/27/2023 PCP: Pcp, No    Brief Narrative:  48 year old with no known previous medical history was found down on 10/27/2023, unresponsive with UDS positive for opioids.  He was minimally responsive to Narcan  and he was found to be markedly hypertensive-  blood pressure 262/156.  CT head notable for 4.1 cm intracranial hemorrhage at the pons with intraventricular extension into the fourth ventricle and basal cisterns.  Initially admitted by critical care, and now extubated with tracheostomy, initially placed on 3/20, replaced on 01/05/2024.  PEG tube placed 11/08/2023.   Remains with poor response.  Stable on trach and PEG.  Disposition remains difficult given patient's need for PEG and tracheostomy tube care.  Currently awaiting disability.   Significant hospital events: 3/8: Intubated in ED, pontine bleed/SAH. CT head Acute large hemorrhage 4.1 cm in pons with intraventricular extension without hydrocephalus. 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  Can follow commands with eyes. 3/16 Purposeful with left upper extremity  3/22 tracheostomy placed at bedside, bleeding issues overnight from trach site 3/24 PEG 3/24 sputum culture with MRSA 4/20: trach was changed to cuffless #8 5/1: Mental status appears to be somewhat improving off narcotics, more awake alert following simple commands(able to grimace, move left toes, track vertically with right eye).  Speech following. Trach downsized to [employer] cuffless #6 XLT 5/4: Modafinil  held, low threshold to resume if mental status/ability to interact diminishes 5/10: PEG dislodged, foley bulb placed and IR consulted to replace tube 5/11: IR replaced PEG tube 5/13 - 5/14: + Fever.  Restarted antibiotics.  Urine culture + E. Coli, foley catheter changed. 5/17 Tracheostomy changed.   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  CT head with acute large hemorrhage 4.1 cm in the pons, ventricular extension. blood pressure 260/1 50s on admission. - Initially admitted to the intensive care unit under PCCM service and neurology following. - 2D echo ejection fraction 60%, A1c 4.7, LDL 96. - Was also treated with 3% hypertonic saline.  Follow-up CT head 3/29 with reduction in size of hemorrhage. -Patient now has trach and PEG in place. Speech therapy, PT OT is following.   Acute respiratory failure secondary to large pontine stroke brainstem compression Unable to protect airway/possible aspiration pneumonia - Now has trach collar.   PCCM intermittently following.  Recommended not a candidate for decannulation.  On 6/1 became tachycardic, tachypneic and febrile.  Chest x-ray unremarkable and procalcitonin was negative.  Initially started on vancomycin  and cefepime  later transitioned to vancomycin  as sputum cultures grew MRSA   Hypertension: Blood pressure improved today.  Continue Norvasc , losartan , Lopressor    Transaminases: Improved.  CT abdomen pelvis negative, acute hepatitis panel negative.    E. coli UTI  completed antibiotics course 5/18 Foley catheter was changed 5/13.  Should change it again on 612/25   Moderate to severe protein caloric malnutrition, POA Body max index 31 Obesity type I PEG tube in  place, continue tube feedings per RD   PEG tube malfunction, resolved PEG  dislodged 5/10, replaced on 5/11 by IR   Exposure keratopathy, OS Seen by ophthalmology, Dr. Mason Sole on 4/7.  Treated with maxitrol ointment (neo-poly-dex) TID OS for 1 week. -- Refresh PM TID to QID OS for chronic lubrication -- Can tape eyelid closed if still exposure an issue (plastic tape may work better than paper and would try to tape eyelid directly rather than with gauze to keep enough tension to ensure it isn't remaining open)   AKI with acute urinary retention Resolved.  Has Foley in place   Macrocytic anemia thrombocytosis  stable/resolved   Perineal skin injury, POA  Continue Gerhard's Butt cream   Relative B12 and folate deficiency Continue replacement with MVI    Goal of care: Currently DNR with intervention.   Nutrition Problem: Inadequate oral intake Etiology: inability to eat   Interventions: Tube feeding, Prostat, MVI, Juven   Estimated body mass index is 33.6 kg/m as calculated from the following:   Height as of this encounter: 5\' 8"  (1.727 m).   Weight as of this encounter: 100.2 kg.   DVT prophylaxis: enoxaparin  (LOVENOX ) injection 40 mg Start: 01/17/24 1000 SCDs Start: 10/27/23 2226   Code Status: Do not attempt resuscitation (DNR) PRE-ARREST INTERVENTIONS DESIRED  Family Communication:     Status is: Inpatient Remains inpatient appropriate because: Awaiting placement     Estimated body mass index is 29.83 kg/m as calculated from the following:   Height as of this encounter: 5\' 8"  (1.727 m).   Weight as of this encounter: 89 kg.   Nutritional Assessment: Body mass index is 29.83 kg/m.Aaron Aas Seen by dietician.  I agree with the assessment and plan as outlined below: Nutrition Status: Nutrition Problem: Inadequate oral intake Etiology: inability to eat Signs/Symptoms: NPO status Interventions: Tube feeding, Prostat, MVI, Juven   . Skin Assessment: I have examined the patient's skin and I agree with the wound assessment as performed by the wound  care RN as outlined below:   Consultants:  Ophthalmology, general surgery, palliative care, critical care   Subjective: Stares at me while in the room.  No meaning full conversation.   Examination:  General exam: Appears calm and comfortable  Respiratory system: Clear to auscultation. Respiratory effort normal. Cardiovascular system: S1 & S2 heard, RRR. No JVD, murmurs, rubs, gallops or clicks. No pedal edema. Gastrointestinal system: Abdomen is nondistended, soft and nontender. No organomegaly or masses felt. Normal bowel sounds heard. Central nervous system: Overall alert but difficult to assess as patient is nonverbal.  Does not really follow commands Skin: No rashes, lesions or ulcers Psychiatry: Unable to assess Peg tube in place Foley catheter in place Tracheostomy in place               Diet Orders (From admission, onward)     Start     Ordered   11/10/23 1245  Diet NPO time specified  (Pre Percutaneous Dilitation Tracheostomy)  Diet effective now       Comments: Clarify time of tube feed discontinuation with provider.   11/10/23 1245            Objective: Vitals:   01/26/24 0320 01/26/24 0338 01/26/24 0500 01/26/24 0902  BP: 136/87     Pulse: 94 100  100  Resp: 20 18  18   Temp: 98 F (36.7 C)     TempSrc: Axillary     SpO2: 95% 95%  98%  Weight:  91 kg   Height:        Intake/Output Summary (Last 24 hours) at 01/26/2024 1009 Last data filed at 01/25/2024 1900 Gross per 24 hour  Intake 400 ml  Output 450 ml  Net -50 ml   Filed Weights   01/24/24 0317 01/25/24 0500 01/26/24 0500  Weight: 89 kg 91 kg 91 kg    Scheduled Meds:  amLODipine   10 mg Per Tube Daily   artificial tears   Left Eye Q8H   Chlorhexidine  Gluconate Cloth  6 each Topical Daily   enoxaparin  (LOVENOX ) injection  40 mg Subcutaneous Daily   famotidine   20 mg Per Tube BID   feeding supplement (OSMOLITE 1.5 CAL)  237 mL Per Tube 5 X Daily   feeding supplement (PROSource  TF20)  60 mL Per Tube TID   free water   150 mL Per Tube Q4H   insulin  aspart  0-15 Units Subcutaneous 5 times per day   losartan   100 mg Per Tube Daily   metoprolol  tartrate  100 mg Per Tube BID   multivitamin with minerals  1 tablet Per Tube Daily   mouth rinse  15 mL Mouth Rinse 4 times per day   Continuous Infusions:  vancomycin  1,250 mg (01/25/24 2150)    Nutritional status Signs/Symptoms: NPO status Interventions: Tube feeding, Prostat, MVI, Juven Body mass index is 30.5 kg/m.  Data Reviewed:   CBC: Recent Labs  Lab 01/20/24 2032 01/21/24 0936 01/24/24 1026 01/25/24 0930  WBC 14.8* 14.2* 6.2 8.2  NEUTROABS 11.1* 9.2* 3.8  --   HGB 13.2 12.4* 12.1* 11.5*  HCT 41.7 38.9* 39.0 36.3*  MCV 91.4 91.3 92.4 91.0  PLT 534* 488* 552* 546*   Basic Metabolic Panel: Recent Labs  Lab 01/20/24 2032 01/23/24 0530 01/24/24 1026 01/25/24 0930  NA 135 136 138 137  K 4.2 3.9 3.9 3.6  CL 98 103 101 102  CO2 25 20* 24 25  GLUCOSE 122* 118* 108* 116*  BUN 19 21* 20 19  CREATININE 0.66 0.46* 0.64 0.41*  CALCIUM 9.1 9.3 9.6 9.2  MG  --   --   --  2.0   GFR: Estimated Creatinine Clearance: 125 mL/min (A) (by C-G formula based on SCr of 0.41 mg/dL (L)). Liver Function Tests: No results for input(s): "AST", "ALT", "ALKPHOS", "BILITOT", "PROT", "ALBUMIN " in the last 168 hours. No results for input(s): "LIPASE", "AMYLASE" in the last 168 hours. No results for input(s): "AMMONIA" in the last 168 hours. Coagulation Profile: No results for input(s): "INR", "PROTIME" in the last 168 hours. Cardiac Enzymes: No results for input(s): "CKTOTAL", "CKMB", "CKMBINDEX", "TROPONINI" in the last 168 hours. BNP (last 3 results) No results for input(s): "PROBNP" in the last 8760 hours. HbA1C: No results for input(s): "HGBA1C" in the last 72 hours. CBG: Recent Labs  Lab 01/24/24 2155 01/25/24 0510 01/25/24 1656 01/25/24 2159 01/26/24 0551  GLUCAP 106* 114* 139* 163* 102*   Lipid  Profile: No results for input(s): "CHOL", "HDL", "LDLCALC", "TRIG", "CHOLHDL", "LDLDIRECT" in the last 72 hours. Thyroid Function Tests: No results for input(s): "TSH", "T4TOTAL", "FREET4", "T3FREE", "THYROIDAB" in the last 72 hours. Anemia Panel: No results for input(s): "VITAMINB12", "FOLATE", "FERRITIN", "TIBC", "IRON", "RETICCTPCT" in the last 72 hours. Sepsis Labs: Recent Labs  Lab 01/20/24 2032  PROCALCITON 0.16    Recent Results (from the past 240 hours)  Culture, blood (Routine X 2) w Reflex to ID Panel     Status: None   Collection Time: 01/20/24  8:32 PM   Specimen: BLOOD  Result Value Ref Range Status   Specimen Description BLOOD SITE NOT SPECIFIED  Final   Special Requests   Final    BOTTLES DRAWN AEROBIC AND ANAEROBIC Blood Culture results may not be optimal due to an inadequate volume of blood received in culture bottles   Culture   Final    NO GROWTH 5 DAYS Performed at [facility], [address], [place], [name] [address]    Report Status 01/25/2024 FINAL  Final  Culture, blood (Routine X 2) w Reflex to ID Panel     Status: None   Collection Time: 01/20/24  8:32 PM   Specimen: BLOOD  Result Value Ref Range Status   Specimen Description BLOOD SITE NOT SPECIFIED  Final   Special Requests   Final    BOTTLES DRAWN AEROBIC AND ANAEROBIC Blood Culture results may not be optimal due to an inadequate volume of blood received in culture bottles   Culture   Final    NO GROWTH 5 DAYS Performed at [facility], [address], [place], [name] [address]    Report Status 01/25/2024 FINAL  Final  Respiratory (~20 pathogens) panel by PCR     Status: None   Collection Time: 01/20/24  8:40 PM   Specimen: Nasopharyngeal Swab; Respiratory  Result Value Ref Range Status   Adenovirus NOT DETECTED NOT DETECTED Final   Coronavirus 229E NOT DETECTED NOT DETECTED Final    Comment: (NOTE) The Coronavirus on the Respiratory Panel, DOES NOT test for the novel   Coronavirus (2019 nCoV)    Coronavirus HKU1 NOT DETECTED NOT DETECTED Final   Coronavirus NL63 NOT DETECTED NOT DETECTED Final   Coronavirus OC43 NOT DETECTED NOT DETECTED Final   Metapneumovirus NOT DETECTED NOT DETECTED Final   Rhinovirus / Enterovirus NOT DETECTED NOT DETECTED Final   Influenza A NOT DETECTED NOT DETECTED Final   Influenza B NOT DETECTED NOT DETECTED Final   Parainfluenza Virus 1 NOT DETECTED NOT DETECTED Final   Parainfluenza Virus 2 NOT DETECTED NOT DETECTED Final   Parainfluenza Virus 3 NOT DETECTED NOT DETECTED Final   Parainfluenza Virus 4 NOT DETECTED NOT DETECTED Final   Respiratory Syncytial Virus NOT DETECTED NOT DETECTED Final   Bordetella pertussis NOT DETECTED NOT DETECTED Final   Bordetella Parapertussis NOT DETECTED NOT DETECTED Final   Chlamydophila pneumoniae NOT DETECTED NOT DETECTED Final   Mycoplasma pneumoniae NOT DETECTED NOT DETECTED Final    Comment: Performed at [facility], [address], [place], [name] [address]  Resp panel by RT-PCR (RSV, Flu A&B, Covid) Anterior Nasal Swab     Status: None   Collection Time: 01/20/24  8:40 PM   Specimen: Anterior Nasal Swab  Result Value Ref Range Status   SARS Coronavirus 2 by RT PCR NEGATIVE NEGATIVE Final   Influenza A by PCR NEGATIVE NEGATIVE Final   Influenza B by PCR NEGATIVE NEGATIVE Final    Comment: (NOTE) The Xpert Xpress SARS-CoV-2/FLU/RSV plus assay is intended as an aid in the diagnosis of influenza from Nasopharyngeal swab specimens and should not be used as a sole basis for treatment. Nasal washings and aspirates are unacceptable for Xpert Xpress SARS-CoV-2/FLU/RSV testing.  Fact Sheet for Patients: BloggerCourse.com  Fact Sheet for Healthcare Providers: SeriousBroker.it  This test is not yet approved or cleared by the [place]  FDA and has been authorized for detection and/or diagnosis of SARS-CoV-2 by FDA under an  Emergency Use  Authorization (EUA). This EUA will remain in effect (meaning this test can be used) for the duration of the COVID-19 declaration under Section 564(b)(1) of the Act, 21 U.S.C. section 360bbb-3(b)(1), unless the authorization is terminated or revoked.     Resp Syncytial Virus by PCR NEGATIVE NEGATIVE Final    Comment: (NOTE) Fact Sheet for Patients: BloggerCourse.com  Fact Sheet for Healthcare Providers: SeriousBroker.it  This test is not yet approved or cleared by the [place]  FDA and has been authorized for detection and/or diagnosis of SARS-CoV-2 by FDA under an Emergency Use Authorization (EUA). This EUA will remain in effect (meaning this test can be used) for the duration of the COVID-19 declaration under Section 564(b)(1) of the Act, 21 U.S.C. section 360bbb-3(b)(1), unless the authorization is terminated or revoked.  Performed at [facility], [address], [place], [name] [address]   Culture, Respiratory w Gram Stain     Status: None   Collection Time: 01/21/24  5:55 AM   Specimen: Tracheal Aspirate  Result Value Ref Range Status   Specimen Description TRACHEAL ASPIRATE  Final   Special Requests NONE  Final   Gram Stain   Final    FEW WBC PRESENT, PREDOMINANTLY PMN FEW GRAM NEGATIVE RODS FEW GRAM POSITIVE COCCI RARE GRAM POSITIVE RODS Performed at [facility], [address] [place], [name] [address]    Culture   Final    MODERATE METHICILLIN RESISTANT STAPHYLOCOCCUS AUREUS   Report Status 01/23/2024 FINAL  Final   Organism ID, Bacteria METHICILLIN RESISTANT STAPHYLOCOCCUS AUREUS  Final      Susceptibility   Methicillin resistant staphylococcus aureus - MIC*    CIPROFLOXACIN <=0.5 SENSITIVE Sensitive     ERYTHROMYCIN  >=8 RESISTANT Resistant     GENTAMICIN <=0.5 SENSITIVE Sensitive     OXACILLIN >=4 RESISTANT Resistant     TETRACYCLINE >=16 RESISTANT Resistant     VANCOMYCIN   <=0.5 SENSITIVE Sensitive     TRIMETH/SULFA <=10 SENSITIVE Sensitive     CLINDAMYCIN <=0.25 SENSITIVE Sensitive     RIFAMPIN <=0.5 SENSITIVE Sensitive     Inducible Clindamycin NEGATIVE Sensitive     LINEZOLID  2 SENSITIVE Sensitive     * MODERATE METHICILLIN RESISTANT STAPHYLOCOCCUS AUREUS         Radiology Studies: No results found.         LOS: 91 days   Time spent= 35 mins    Maggie Schooner, MD [facility]  If 7PM-7AM, please contact night-coverage  01/26/2024, 10:09 AM

## 2024-01-26 NOTE — Plan of Care (Signed)
  Problem: Education: Goal: Knowledge of disease or condition will improve Outcome: Progressing Goal: Knowledge of secondary prevention will improve (MUST DOCUMENT ALL) Outcome: Progressing Goal: Knowledge of patient specific risk factors will improve (DELETE if not current risk factor) Outcome: Progressing   Problem: Intracerebral Hemorrhage Tissue Perfusion: Goal: Complications of Intracerebral Hemorrhage will be minimized Outcome: Progressing   Problem: Coping: Goal: Will verbalize positive feelings about self Outcome: Progressing Goal: Will identify appropriate support needs Outcome: Progressing   Problem: Health Behavior/Discharge Planning: Goal: Ability to manage health-related needs will improve Outcome: Progressing Goal: Goals will be collaboratively established with patient/family Outcome: Progressing   Problem: Self-Care: Goal: Ability to participate in self-care as condition permits will improve Outcome: Progressing Goal: Verbalization of feelings and concerns over difficulty with self-care will improve Outcome: Progressing Goal: Ability to communicate needs accurately will improve Outcome: Progressing   Problem: Nutrition: Goal: Risk of aspiration will decrease Outcome: Progressing Goal: Dietary intake will improve Outcome: Progressing

## 2024-01-27 DIAGNOSIS — I612 Nontraumatic intracerebral hemorrhage in hemisphere, unspecified: Secondary | ICD-10-CM | POA: Diagnosis not present

## 2024-01-27 DIAGNOSIS — I613 Nontraumatic intracerebral hemorrhage in brain stem: Secondary | ICD-10-CM | POA: Diagnosis not present

## 2024-01-27 LAB — GLUCOSE, CAPILLARY
Glucose-Capillary: 162 mg/dL — ABNORMAL HIGH (ref 70–99)
Glucose-Capillary: 200 mg/dL — ABNORMAL HIGH (ref 70–99)
Glucose-Capillary: 84 mg/dL (ref 70–99)
Glucose-Capillary: 87 mg/dL (ref 70–99)
Glucose-Capillary: 93 mg/dL (ref 70–99)
Glucose-Capillary: 98 mg/dL (ref 70–99)

## 2024-01-27 NOTE — Plan of Care (Signed)
  Problem: Education: Goal: Knowledge of disease or condition will improve 01/27/2024 1454 by Anders Katz, RN Outcome: Progressing 01/27/2024 1454 by Anders Katz, RN Outcome: Progressing Goal: Knowledge of secondary prevention will improve (MUST DOCUMENT ALL) 01/27/2024 1454 by Anders Katz, RN Outcome: Progressing 01/27/2024 1454 by Anders Katz, RN Outcome: Progressing Goal: Knowledge of patient specific risk factors will improve (DELETE if not current risk factor) 01/27/2024 1454 by Anders Katz, RN Outcome: Progressing 01/27/2024 1454 by Anders Katz, RN Outcome: Progressing   Problem: Intracerebral Hemorrhage Tissue Perfusion: Goal: Complications of Intracerebral Hemorrhage will be minimized 01/27/2024 1454 by Anders Katz, RN Outcome: Progressing 01/27/2024 1454 by Anders Katz, RN Outcome: Progressing   Problem: Coping: Goal: Will verbalize positive feelings about self 01/27/2024 1454 by Anders Katz, RN Outcome: Progressing 01/27/2024 1454 by Anders Katz, RN Outcome: Progressing Goal: Will identify appropriate support needs 01/27/2024 1454 by Anders Katz, RN Outcome: Progressing 01/27/2024 1454 by Anders Katz, RN Outcome: Progressing

## 2024-01-27 NOTE — Plan of Care (Signed)
  Problem: Education: Goal: Knowledge of disease or condition will improve Outcome: Progressing Goal: Knowledge of secondary prevention will improve (MUST DOCUMENT ALL) Outcome: Progressing Goal: Knowledge of patient specific risk factors will improve (DELETE if not current risk factor) Outcome: Progressing   Problem: Intracerebral Hemorrhage Tissue Perfusion: Goal: Complications of Intracerebral Hemorrhage will be minimized Outcome: Progressing   Problem: Coping: Goal: Will verbalize positive feelings about self Outcome: Progressing Goal: Will identify appropriate support needs Outcome: Progressing   Problem: Health Behavior/Discharge Planning: Goal: Ability to manage health-related needs will improve Outcome: Progressing Goal: Goals will be collaboratively established with patient/family Outcome: Progressing   Problem: Self-Care: Goal: Ability to participate in self-care as condition permits will improve Outcome: Progressing Goal: Verbalization of feelings and concerns over difficulty with self-care will improve Outcome: Progressing Goal: Ability to communicate needs accurately will improve Outcome: Progressing

## 2024-01-27 NOTE — Progress Notes (Signed)
 PROGRESS NOTE    Garrett [name]  GNF:[id number] DOB: [birth year] DOA: 10/27/2023 PCP: Pcp, No    Brief Narrative:  48 year old with no known previous medical history was found down on 10/27/2023, unresponsive with UDS positive for opioids.  He was minimally responsive to Narcan  and he was found to be markedly hypertensive-  blood pressure 262/156.  CT head notable for 4.1 cm intracranial hemorrhage at the pons with intraventricular extension into the fourth ventricle and basal cisterns.  Initially admitted by critical care, and now extubated with tracheostomy, initially placed on 3/20, replaced on 01/05/2024.  PEG tube placed 11/08/2023.   Remains with poor response.  Stable on trach and PEG.  Disposition remains difficult given patient's need for PEG and tracheostomy tube care.  Currently awaiting disability.   Significant hospital events: 3/8: Intubated in ED, pontine bleed/SAH. CT head Acute large hemorrhage 4.1 cm in pons with intraventricular extension without hydrocephalus. 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  Can follow commands with eyes. 3/16 Purposeful with left upper extremity  3/22 tracheostomy placed at bedside, bleeding issues overnight from trach site 3/24 PEG 3/24 sputum culture with MRSA 4/20: trach was changed to cuffless #8 5/1: Mental status appears to be somewhat improving off narcotics, more awake alert following simple commands(able to grimace, move left toes, track vertically with right eye).  Speech following. Trach downsized to [employer] cuffless #6 XLT 5/4: Modafinil  held, low threshold to resume if mental status/ability to interact diminishes 5/10: PEG dislodged, foley bulb placed and IR consulted to replace tube 5/11: IR replaced PEG tube 5/13 - 5/14: + Fever.  Restarted antibiotics.  Urine culture + E. Coli, foley catheter changed. 5/17 Tracheostomy changed.   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  CT head with acute large hemorrhage 4.1 cm in the pons, ventricular extension. blood pressure 260/1 50s on admission. - Initially admitted to the intensive care unit under PCCM service and neurology following. - 2D echo ejection fraction 60%, A1c 4.7, LDL 96. - Was also treated with 3% hypertonic saline.  Follow-up CT head 3/29 with reduction in size of hemorrhage. -Patient now has trach and PEG in place. Speech therapy, PT OT is following.   Acute respiratory failure secondary to large pontine stroke brainstem compression Unable to protect airway/possible aspiration pneumonia - Now has trach collar.   PCCM intermittently following.  Recommended not a candidate for decannulation.  On 6/1 became tachycardic, tachypneic and febrile.  Chest x-ray unremarkable and procalcitonin was negative.  Initially started on vancomycin  and cefepime  later transitioned to vancomycin  as sputum cultures grew MRSA   Hypertension: Blood pressure improved today.  Continue Norvasc , losartan , Lopressor    Transaminases: Improved.  CT abdomen pelvis negative, acute hepatitis panel negative.    E. coli UTI  completed antibiotics course 5/18 Foley catheter was changed 5/13.  Should change it again on 612/25   Moderate to severe protein caloric malnutrition, POA Body max index 31 Obesity type I PEG tube in  place, continue tube feedings per RD   PEG tube malfunction, resolved PEG  dislodged 5/10, replaced on 5/11 by IR   Exposure keratopathy, OS Seen by ophthalmology, Dr. Mason Sole on 4/7.  Treated with maxitrol ointment (neo-poly-dex) TID OS for 1 week. -- Refresh PM TID to QID OS for chronic lubrication -- Can tape eyelid closed if still exposure an issue (plastic tape may work better than paper and would try to tape eyelid directly rather than with gauze to keep enough tension to ensure it isn't remaining open)   AKI with acute urinary retention Resolved.  Has Foley in place   Macrocytic anemia thrombocytosis  stable/resolved   Perineal skin injury, POA  Continue Gerhard's Butt cream   Relative B12 and folate deficiency Continue replacement with MVI    Goal of care: Currently DNR with intervention.   Nutrition Problem: Inadequate oral intake Etiology: inability to eat   Interventions: Tube feeding, Prostat, MVI, Juven   Estimated body mass index is 33.6 kg/m as calculated from the following:   Height as of this encounter: 5\' 8"  (1.727 m).   Weight as of this encounter: 100.2 kg.   DVT prophylaxis: enoxaparin  (LOVENOX ) injection 40 mg Start: 01/17/24 1000 SCDs Start: 10/27/23 2226   Code Status: Do not attempt resuscitation (DNR) PRE-ARREST INTERVENTIONS DESIRED  Family Communication:     Status is: Inpatient Remains inpatient appropriate because: Awaiting placement     Estimated body mass index is 29.83 kg/m as calculated from the following:   Height as of this encounter: 5\' 8"  (1.727 m).   Weight as of this encounter: 89 kg.   Nutritional Assessment: Body mass index is 29.83 kg/m.Aaron Aas Seen by dietician.  I agree with the assessment and plan as outlined below: Nutrition Status: Nutrition Problem: Inadequate oral intake Etiology: inability to eat Signs/Symptoms: NPO status Interventions: Tube feeding, Prostat, MVI, Juven   . Skin Assessment: I have examined the patient's skin and I agree with the wound assessment as performed by the wound  care RN as outlined below:   Consultants:  Ophthalmology, general surgery, palliative care, critical care   Subjective: Stares at me while in the room.  No meaning full conversation.   Examination:  General exam: Appears calm and comfortable  Respiratory system: Clear to auscultation. Respiratory effort normal. Cardiovascular system: S1 & S2 heard, RRR. No JVD, murmurs, rubs, gallops or clicks. No pedal edema. Gastrointestinal system: Abdomen is nondistended, soft and nontender. No organomegaly or masses felt. Normal bowel sounds heard. Central nervous system: Overall alert but difficult to assess as patient is nonverbal.  Does not really follow commands Skin: No rashes, lesions or ulcers Psychiatry: Unable to assess Peg tube in place Foley catheter in place Tracheostomy in place               Diet Orders (From admission, onward)     Start     Ordered   11/10/23 1245  Diet NPO time specified  (Pre Percutaneous Dilitation Tracheostomy)  Diet effective now       Comments: Clarify time of tube feed discontinuation with provider.   11/10/23 1245            Objective: Vitals:   01/27/24 0451 01/27/24 0500 01/27/24 0728 01/27/24 0736  BP: (!) 135/103  130/87   Pulse: 82  79 82  Resp: 19  16 18   Temp: 98 F (36.7 C)  98.1 F (36.7 C)   TempSrc: Oral  Axillary   SpO2: 96%  100%  98%  Weight:  91 kg    Height:        Intake/Output Summary (Last 24 hours) at 01/27/2024 1022 Last data filed at 01/27/2024 1610 Gross per 24 hour  Intake 1294 ml  Output 1900 ml  Net -606 ml   Filed Weights   01/25/24 0500 01/26/24 0500 01/27/24 0500  Weight: 91 kg 91 kg 91 kg    Scheduled Meds:  amLODipine   10 mg Per Tube Daily   artificial tears   Left Eye Q8H   Chlorhexidine  Gluconate Cloth  6 each Topical Daily   enoxaparin  (LOVENOX ) injection  40 mg Subcutaneous Daily   famotidine   20 mg Per Tube BID   feeding supplement (OSMOLITE 1.5 CAL)  237 mL Per Tube 5 X Daily    feeding supplement (PROSource TF20)  60 mL Per Tube TID   free water   150 mL Per Tube Q4H   insulin  aspart  0-15 Units Subcutaneous 5 times per day   losartan   100 mg Per Tube Daily   metoprolol  tartrate  100 mg Per Tube BID   multivitamin with minerals  1 tablet Per Tube Daily   mouth rinse  15 mL Mouth Rinse 4 times per day   Continuous Infusions:  Nutritional status Signs/Symptoms: NPO status Interventions: Tube feeding, Prostat, MVI, Juven Body mass index is 30.5 kg/m.  Data Reviewed:   CBC: Recent Labs  Lab 01/20/24 2032 01/21/24 0936 01/24/24 1026 01/25/24 0930  WBC 14.8* 14.2* 6.2 8.2  NEUTROABS 11.1* 9.2* 3.8  --   HGB 13.2 12.4* 12.1* 11.5*  HCT 41.7 38.9* 39.0 36.3*  MCV 91.4 91.3 92.4 91.0  PLT 534* 488* 552* 546*   Basic Metabolic Panel: Recent Labs  Lab 01/20/24 2032 01/23/24 0530 01/24/24 1026 01/25/24 0930  NA 135 136 138 137  K 4.2 3.9 3.9 3.6  CL 98 103 101 102  CO2 25 20* 24 25  GLUCOSE 122* 118* 108* 116*  BUN 19 21* 20 19  CREATININE 0.66 0.46* 0.64 0.41*  CALCIUM 9.1 9.3 9.6 9.2  MG  --   --   --  2.0   GFR: Estimated Creatinine Clearance: 125 mL/min (A) (by C-G formula based on SCr of 0.41 mg/dL (L)). Liver Function Tests: No results for input(s): "AST", "ALT", "ALKPHOS", "BILITOT", "PROT", "ALBUMIN " in the last 168 hours. No results for input(s): "LIPASE", "AMYLASE" in the last 168 hours. No results for input(s): "AMMONIA" in the last 168 hours. Coagulation Profile: No results for input(s): "INR", "PROTIME" in the last 168 hours. Cardiac Enzymes: No results for input(s): "CKTOTAL", "CKMB", "CKMBINDEX", "TROPONINI" in the last 168 hours. BNP (last 3 results) No results for input(s): "PROBNP" in the last 8760 hours. HbA1C: No results for input(s): "HGBA1C" in the last 72 hours. CBG: Recent Labs  Lab 01/26/24 1722 01/26/24 2121 01/27/24 0013 01/27/24 0447 01/27/24 0856  GLUCAP 98 94 98 162* 93   Lipid Profile: No results  for input(s): "CHOL", "HDL", "LDLCALC", "TRIG", "CHOLHDL", "LDLDIRECT" in the last 72 hours. Thyroid Function Tests: No results for input(s): "TSH", "T4TOTAL", "FREET4", "T3FREE", "THYROIDAB" in the last 72 hours. Anemia Panel: No results for input(s): "VITAMINB12", "FOLATE", "FERRITIN", "TIBC", "IRON", "RETICCTPCT" in the last 72 hours. Sepsis Labs: Recent Labs  Lab 01/20/24 2032  PROCALCITON 0.16    Recent Results (from the past 240 hours)  Culture, blood (Routine X 2) w Reflex to ID Panel     Status: None   Collection Time: 01/20/24  8:32 PM  Specimen: BLOOD  Result Value Ref Range Status   Specimen Description BLOOD SITE NOT SPECIFIED  Final   Special Requests   Final    BOTTLES DRAWN AEROBIC AND ANAEROBIC Blood Culture results may not be optimal due to an inadequate volume of blood received in culture bottles   Culture   Final    NO GROWTH 5 DAYS Performed at [facility], [address], [place], [name] [address]    Report Status 01/25/2024 FINAL  Final  Culture, blood (Routine X 2) w Reflex to ID Panel     Status: None   Collection Time: 01/20/24  8:32 PM   Specimen: BLOOD  Result Value Ref Range Status   Specimen Description BLOOD SITE NOT SPECIFIED  Final   Special Requests   Final    BOTTLES DRAWN AEROBIC AND ANAEROBIC Blood Culture results may not be optimal due to an inadequate volume of blood received in culture bottles   Culture   Final    NO GROWTH 5 DAYS Performed at [facility], [address], [place], [name] [address]    Report Status 01/25/2024 FINAL  Final  Respiratory (~20 pathogens) panel by PCR     Status: None   Collection Time: 01/20/24  8:40 PM   Specimen: Nasopharyngeal Swab; Respiratory  Result Value Ref Range Status   Adenovirus NOT DETECTED NOT DETECTED Final   Coronavirus 229E NOT DETECTED NOT DETECTED Final    Comment: (NOTE) The Coronavirus on the Respiratory Panel, DOES NOT test for the novel  Coronavirus (2019 nCoV)     Coronavirus HKU1 NOT DETECTED NOT DETECTED Final   Coronavirus NL63 NOT DETECTED NOT DETECTED Final   Coronavirus OC43 NOT DETECTED NOT DETECTED Final   Metapneumovirus NOT DETECTED NOT DETECTED Final   Rhinovirus / Enterovirus NOT DETECTED NOT DETECTED Final   Influenza A NOT DETECTED NOT DETECTED Final   Influenza B NOT DETECTED NOT DETECTED Final   Parainfluenza Virus 1 NOT DETECTED NOT DETECTED Final   Parainfluenza Virus 2 NOT DETECTED NOT DETECTED Final   Parainfluenza Virus 3 NOT DETECTED NOT DETECTED Final   Parainfluenza Virus 4 NOT DETECTED NOT DETECTED Final   Respiratory Syncytial Virus NOT DETECTED NOT DETECTED Final   Bordetella pertussis NOT DETECTED NOT DETECTED Final   Bordetella Parapertussis NOT DETECTED NOT DETECTED Final   Chlamydophila pneumoniae NOT DETECTED NOT DETECTED Final   Mycoplasma pneumoniae NOT DETECTED NOT DETECTED Final    Comment: Performed at [facility], [address], [place], [name] [address]  Resp panel by RT-PCR (RSV, Flu A&B, Covid) Anterior Nasal Swab     Status: None   Collection Time: 01/20/24  8:40 PM   Specimen: Anterior Nasal Swab  Result Value Ref Range Status   SARS Coronavirus 2 by RT PCR NEGATIVE NEGATIVE Final   Influenza A by PCR NEGATIVE NEGATIVE Final   Influenza B by PCR NEGATIVE NEGATIVE Final    Comment: (NOTE) The Xpert Xpress SARS-CoV-2/FLU/RSV plus assay is intended as an aid in the diagnosis of influenza from Nasopharyngeal swab specimens and should not be used as a sole basis for treatment. Nasal washings and aspirates are unacceptable for Xpert Xpress SARS-CoV-2/FLU/RSV testing.  Fact Sheet for Patients: BloggerCourse.com  Fact Sheet for Healthcare Providers: SeriousBroker.it  This test is not yet approved or cleared by the [place]  FDA and has been authorized for detection and/or diagnosis of SARS-CoV-2 by FDA under an Emergency Use Authorization  (EUA). This EUA  will remain in effect (meaning this test can be used) for the duration of the COVID-19 declaration under Section 564(b)(1) of the Act, 21 U.S.C. section 360bbb-3(b)(1), unless the authorization is terminated or revoked.     Resp Syncytial Virus by PCR NEGATIVE NEGATIVE Final    Comment: (NOTE) Fact Sheet for Patients: BloggerCourse.com  Fact Sheet for Healthcare Providers: SeriousBroker.it  This test is not yet approved or cleared by the [place]  FDA and has been authorized for detection and/or diagnosis of SARS-CoV-2 by FDA under an Emergency Use Authorization (EUA). This EUA will remain in effect (meaning this test can be used) for the duration of the COVID-19 declaration under Section 564(b)(1) of the Act, 21 U.S.C. section 360bbb-3(b)(1), unless the authorization is terminated or revoked.  Performed at [facility], [address] [place], [name] [address]   Culture, Respiratory w Gram Stain     Status: None   Collection Time: 01/21/24  5:55 AM   Specimen: Tracheal Aspirate  Result Value Ref Range Status   Specimen Description TRACHEAL ASPIRATE  Final   Special Requests NONE  Final   Gram Stain   Final    FEW WBC PRESENT, PREDOMINANTLY PMN FEW GRAM NEGATIVE RODS FEW GRAM POSITIVE COCCI RARE GRAM POSITIVE RODS Performed at [facility], [address] [place], [name] [address]    Culture   Final    MODERATE METHICILLIN RESISTANT STAPHYLOCOCCUS AUREUS   Report Status 01/23/2024 FINAL  Final   Organism ID, Bacteria METHICILLIN RESISTANT STAPHYLOCOCCUS AUREUS  Final      Susceptibility   Methicillin resistant staphylococcus aureus - MIC*    CIPROFLOXACIN <=0.5 SENSITIVE Sensitive     ERYTHROMYCIN  >=8 RESISTANT Resistant     GENTAMICIN <=0.5 SENSITIVE Sensitive     OXACILLIN >=4 RESISTANT Resistant     TETRACYCLINE >=16 RESISTANT Resistant     VANCOMYCIN  <=0.5 SENSITIVE Sensitive      TRIMETH/SULFA <=10 SENSITIVE Sensitive     CLINDAMYCIN <=0.25 SENSITIVE Sensitive     RIFAMPIN <=0.5 SENSITIVE Sensitive     Inducible Clindamycin NEGATIVE Sensitive     LINEZOLID  2 SENSITIVE Sensitive     * MODERATE METHICILLIN RESISTANT STAPHYLOCOCCUS AUREUS         Radiology Studies: No results found.         LOS: 92 days   Time spent= 35 mins    Maggie Schooner, MD [facility]  If 7PM-7AM, please contact night-coverage  01/27/2024, 10:22 AM

## 2024-01-27 NOTE — Plan of Care (Signed)
 Reviewing plan of care.   Problem: Intracerebral Hemorrhage Tissue Perfusion: Goal: Complications of Intracerebral Hemorrhage will be minimized Outcome: Progressing   Problem: Coping: Goal: Will verbalize positive feelings about self Outcome: Not Progressing Goal: Will identify appropriate support needs Outcome: Progressing   Problem: Health Behavior/Discharge Planning: Goal: Ability to manage health-related needs will improve Outcome: Progressing Goal: Goals will be collaboratively established with patient/family Outcome: Progressing   Problem: Self-Care: Goal: Ability to participate in self-care as condition permits will improve Outcome: Not Progressing Goal: Verbalization of feelings and concerns over difficulty with self-care will improve Outcome: Not Progressing Goal: Ability to communicate needs accurately will improve Outcome: Not Progressing   Problem: Nutrition: Goal: Risk of aspiration will decrease Outcome: Progressing Goal: Dietary intake will improve Outcome: Progressing   Problem: Health Behavior/Discharge Planning: Goal: Ability to identify and utilize available resources and services will improve Outcome: Progressing Goal: Ability to manage health-related needs will improve Outcome: Not Progressing   Problem: Metabolic: Goal: Ability to maintain appropriate glucose levels will improve Outcome: Progressing   Problem: Skin Integrity: Goal: Risk for impaired skin integrity will decrease Outcome: Progressing   Problem: Clinical Measurements: Goal: Ability to maintain clinical measurements within normal limits will improve Outcome: Progressing Goal: Will remain free from infection Outcome: Progressing Goal: Diagnostic test results will improve Outcome: Progressing Goal: Respiratory complications will improve Outcome: Progressing Goal: Cardiovascular complication will be avoided Outcome: Progressing   Problem: Activity: Goal: Risk for activity  intolerance will decrease Outcome: Not Progressing   Problem: Elimination: Goal: Will not experience complications related to bowel motility Outcome: Progressing Goal: Will not experience complications related to urinary retention Outcome: Progressing    Brain Cahill, RN

## 2024-01-28 DIAGNOSIS — Z930 Tracheostomy status: Secondary | ICD-10-CM | POA: Diagnosis not present

## 2024-01-28 DIAGNOSIS — J9601 Acute respiratory failure with hypoxia: Secondary | ICD-10-CM | POA: Diagnosis not present

## 2024-01-28 DIAGNOSIS — I613 Nontraumatic intracerebral hemorrhage in brain stem: Secondary | ICD-10-CM | POA: Diagnosis not present

## 2024-01-28 DIAGNOSIS — I612 Nontraumatic intracerebral hemorrhage in hemisphere, unspecified: Secondary | ICD-10-CM | POA: Diagnosis not present

## 2024-01-28 LAB — CBC
HCT: 40.1 % (ref 39.0–52.0)
Hemoglobin: 12.5 g/dL — ABNORMAL LOW (ref 13.0–17.0)
MCH: 28.8 pg (ref 26.0–34.0)
MCHC: 31.2 g/dL (ref 30.0–36.0)
MCV: 92.4 fL (ref 80.0–100.0)
Platelets: 586 10*3/uL — ABNORMAL HIGH (ref 150–400)
RBC: 4.34 MIL/uL (ref 4.22–5.81)
RDW: 14.6 % (ref 11.5–15.5)
WBC: 7.8 10*3/uL (ref 4.0–10.5)
nRBC: 0 % (ref 0.0–0.2)

## 2024-01-28 LAB — MAGNESIUM: Magnesium: 2.1 mg/dL (ref 1.7–2.4)

## 2024-01-28 LAB — BASIC METABOLIC PANEL WITH GFR
Anion gap: 10 (ref 5–15)
BUN: 23 mg/dL — ABNORMAL HIGH (ref 6–20)
CO2: 25 mmol/L (ref 22–32)
Calcium: 9.6 mg/dL (ref 8.9–10.3)
Chloride: 103 mmol/L (ref 98–111)
Creatinine, Ser: 0.55 mg/dL — ABNORMAL LOW (ref 0.61–1.24)
GFR, Estimated: >60 mL/min (ref >60)
Glucose, Bld: 97 mg/dL (ref 70–99)
Potassium: 4.2 mmol/L (ref 3.5–5.1)
Sodium: 138 mmol/L (ref 135–145)

## 2024-01-28 LAB — GLUCOSE, CAPILLARY
Glucose-Capillary: 107 mg/dL — ABNORMAL HIGH (ref 70–99)
Glucose-Capillary: 137 mg/dL — ABNORMAL HIGH (ref 70–99)
Glucose-Capillary: 132 mg/dL — ABNORMAL HIGH (ref 70–99)
Glucose-Capillary: 129 mg/dL — ABNORMAL HIGH (ref 70–99)
Glucose-Capillary: 89 mg/dL (ref 70–99)

## 2024-01-28 NOTE — Plan of Care (Signed)
 Reviewing plan of care.   Problem: Intracerebral Hemorrhage Tissue Perfusion: Goal: Complications of Intracerebral Hemorrhage will be minimized Outcome: Progressing   Problem: Coping: Goal: Will verbalize positive feelings about self Outcome: Not Progressing Goal: Will identify appropriate support needs Outcome: Progressing   Problem: Health Behavior/Discharge Planning: Goal: Ability to manage health-related needs will improve Outcome: Progressing Goal: Goals will be collaboratively established with patient/family Outcome: Progressing   Problem: Self-Care: Goal: Ability to participate in self-care as condition permits will improve Outcome: Not Progressing Goal: Verbalization of feelings and concerns over difficulty with self-care will improve Outcome: Not Progressing Goal: Ability to communicate needs accurately will improve Outcome: Not Progressing   Problem: Nutrition: Goal: Risk of aspiration will decrease Outcome: Progressing Goal: Dietary intake will improve Outcome: Progressing   Problem: Health Behavior/Discharge Planning: Goal: Ability to identify and utilize available resources and services will improve Outcome: Progressing Goal: Ability to manage health-related needs will improve Outcome: Not Progressing   Problem: Metabolic: Goal: Ability to maintain appropriate glucose levels will improve Outcome: Progressing   Problem: Skin Integrity: Goal: Risk for impaired skin integrity will decrease Outcome: Progressing   Problem: Clinical Measurements: Goal: Ability to maintain clinical measurements within normal limits will improve Outcome: Progressing Goal: Will remain free from infection Outcome: Progressing Goal: Diagnostic test results will improve Outcome: Progressing Goal: Respiratory complications will improve Outcome: Progressing Goal: Cardiovascular complication will be avoided Outcome: Progressing   Problem: Activity: Goal: Risk for activity  intolerance will decrease Outcome: Not Progressing   Problem: Elimination: Goal: Will not experience complications related to bowel motility Outcome: Progressing Goal: Will not experience complications related to urinary retention Outcome: Progressing    Brain Cahill, RN

## 2024-01-28 NOTE — Progress Notes (Signed)
 PROGRESS NOTE    Garrett [name]  ZOX:[id number] DOB: 1976/02/04 DOA: 10/27/2023 PCP: Pcp, No    Brief Narrative:  48 year old with no known previous medical history was found down on 10/27/2023, unresponsive with UDS positive for opioids.  He was minimally responsive to Narcan  and he was found to be markedly hypertensive-  blood pressure 262/156.  CT head notable for 4.1 cm intracranial hemorrhage at the pons with intraventricular extension into the fourth ventricle and basal cisterns.  Initially admitted by critical care, and now extubated with tracheostomy, initially placed on 3/20, replaced on 01/05/2024.  PEG tube placed 11/08/2023.   Remains with poor response.  Stable on trach and PEG.  Disposition remains difficult given patient's need for PEG and tracheostomy tube care.  Currently awaiting disability.   Significant hospital events: 3/8: Intubated in ED, pontine bleed/SAH. CT head Acute large hemorrhage 4.1 cm in pons with intraventricular extension without hydrocephalus. 3/9: CTA No change in IPH in pons, similar biparietal [facility] 3/14: Now awake, appears locked in syndrome.  Can follow commands with eyes. 3/16 Purposeful with left upper extremity  3/22 tracheostomy placed at bedside, bleeding issues overnight from trach site 3/24 PEG 3/24 sputum culture with MRSA 4/20: trach was changed to cuffless #8 5/1: Mental status appears to be somewhat improving off narcotics, more awake alert following simple commands(able to grimace, move left toes, track vertically with right eye).  Speech following. Trach downsized to [employer] cuffless #6 XLT 5/4: Modafinil  held, low threshold to resume if mental status/ability to interact diminishes 5/10: PEG dislodged, foley bulb placed and IR consulted to replace tube 5/11: IR replaced PEG tube 5/13 - 5/14: + Fever.  Restarted antibiotics.  Urine culture + E. Coli, foley catheter changed. 5/17 Tracheostomy changed.   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  CT head with acute large hemorrhage 4.1 cm in the pons, ventricular extension. blood pressure 260/1 50s on admission. - Initially admitted to the intensive care unit under PCCM service and neurology following. - 2D echo ejection fraction 60%, A1c 4.7, LDL 96. - Was also treated with 3% hypertonic saline.  Follow-up CT head 3/29 with reduction in size of hemorrhage. -Patient now has trach and PEG in place. Speech therapy, PT OT is following.   Acute respiratory failure secondary to large pontine stroke brainstem compression Unable to protect airway/possible aspiration pneumonia - Now has trach collar.   PCCM intermittently following.  Recommended not a candidate for decannulation.  On 6/1 became tachycardic, tachypneic and febrile.  Chest x-ray unremarkable and procalcitonin was negative.  Initially started on vancomycin  and cefepime  later transitioned to vancomycin  as sputum cultures grew MRSA   Hypertension: Blood pressure improved today.  Continue Norvasc , losartan , Lopressor    Transaminases: Improved.  CT abdomen pelvis negative, acute hepatitis panel negative.    E. coli UTI  completed antibiotics course 5/18 Foley catheter was changed 5/13.  Should change it again on 612/25   Moderate to severe protein caloric malnutrition, POA Body max index 31 Obesity type I PEG tube in  place, continue tube feedings per RD   PEG tube malfunction, resolved PEG  dislodged 5/10, replaced on 5/11 by IR   Exposure keratopathy, OS Seen by ophthalmology, Dr. Mason Sole on 4/7.  Treated with maxitrol ointment (neo-poly-dex) TID OS for 1 week. -- Refresh PM TID to QID OS for chronic lubrication -- Can tape eyelid closed if still exposure an issue (plastic tape may work better than paper and would try to tape eyelid directly rather than with gauze to keep enough tension to ensure it isn't remaining open)   AKI with acute urinary retention Resolved.  Has Foley in place   Macrocytic anemia thrombocytosis  stable/resolved   Perineal skin injury, POA  Continue Gerhard's Butt cream   Relative B12 and folate deficiency Continue replacement with MVI    Goal of care: Currently DNR with intervention.   Nutrition Problem: Inadequate oral intake Etiology: inability to eat   Interventions: Tube feeding, Prostat, MVI, Juven   Estimated body mass index is 33.6 kg/m as calculated from the following:   Height as of this encounter: 5\' 8"  (1.727 m).   Weight as of this encounter: 100.2 kg.   DVT prophylaxis: enoxaparin  (LOVENOX ) injection 40 mg Start: 01/17/24 1000 SCDs Start: 10/27/23 2226   Code Status: Do not attempt resuscitation (DNR) PRE-ARREST INTERVENTIONS DESIRED  Family Communication:     Status is: Inpatient Remains inpatient appropriate because: Awaiting placement     Estimated body mass index is 29.83 kg/m as calculated from the following:   Height as of this encounter: 5\' 8"  (1.727 m).   Weight as of this encounter: 89 kg.   Nutritional Assessment: Body mass index is 29.83 kg/m.Aaron Aas Seen by dietician.  I agree with the assessment and plan as outlined below: Nutrition Status: Nutrition Problem: Inadequate oral intake Etiology: inability to eat Signs/Symptoms: NPO status Interventions: Tube feeding, Prostat, MVI, Juven   . Skin Assessment: I have examined the patient's skin and I agree with the wound assessment as performed by the wound  care RN as outlined below:   Consultants:  Ophthalmology, general surgery, palliative care, critical care   Subjective: Stares at me while in the room.  No meaning full conversation.   Examination:  General exam: Appears calm and comfortable  Respiratory system: Clear to auscultation. Respiratory effort normal. Cardiovascular system: S1 & S2 heard, RRR. No JVD, murmurs, rubs, gallops or clicks. No pedal edema. Gastrointestinal system: Abdomen is nondistended, soft and nontender. No organomegaly or masses felt. Normal bowel sounds heard. Central nervous system: Overall alert but difficult to assess as patient is nonverbal.  Does not really follow commands Skin: No rashes, lesions or ulcers Psychiatry: Unable to assess Peg tube in place Foley catheter in place Tracheostomy in place               Diet Orders (From admission, onward)     Start     Ordered   11/10/23 1245  Diet NPO time specified  (Pre Percutaneous Dilitation Tracheostomy)  Diet effective now       Comments: Clarify time of tube feed discontinuation with provider.   11/10/23 1245            Objective: Vitals:   01/28/24 0340 01/28/24 0423 01/28/24 0500 01/28/24 0849  BP:  (!) 137/109  (!) 140/105  Pulse: 90 75  80  Resp: 16 18  19   Temp:  97.9 F (36.6 C)  97.8 F (36.6 C)  TempSrc:  Oral  Axillary  SpO2: 92% 100%  99%  Weight:   91 kg   Height:        Intake/Output Summary (Last 24 hours) at 01/28/2024 1206 Last data filed at 01/28/2024 0508 Gross per 24 hour  Intake 1524 ml  Output 1950 ml  Net -426 ml   Filed Weights   01/26/24 0500 01/27/24 0500 01/28/24 0500  Weight: 91 kg 91 kg 91 kg    Scheduled Meds:  amLODipine   10 mg Per Tube Daily   artificial tears   Left Eye Q8H   Chlorhexidine  Gluconate Cloth  6 each Topical Daily   enoxaparin  (LOVENOX ) injection  40 mg Subcutaneous Daily   famotidine   20 mg Per Tube BID   feeding supplement (OSMOLITE 1.5 CAL)  237 mL Per Tube 5 X  Daily   feeding supplement (PROSource TF20)  60 mL Per Tube TID   free water   150 mL Per Tube Q4H   insulin  aspart  0-15 Units Subcutaneous 5 times per day   losartan   100 mg Per Tube Daily   metoprolol  tartrate  100 mg Per Tube BID   multivitamin with minerals  1 tablet Per Tube Daily   mouth rinse  15 mL Mouth Rinse 4 times per day   Continuous Infusions:  Nutritional status Signs/Symptoms: NPO status Interventions: Tube feeding, Prostat, MVI, Juven Body mass index is 30.5 kg/m.  Data Reviewed:   CBC: Recent Labs  Lab 01/24/24 1026 01/25/24 0930 01/28/24 0739  WBC 6.2 8.2 7.8  NEUTROABS 3.8  --   --   HGB 12.1* 11.5* 12.5*  HCT 39.0 36.3* 40.1  MCV 92.4 91.0 92.4  PLT 552* 546* 586*   Basic Metabolic Panel: Recent Labs  Lab 01/23/24 0530 01/24/24 1026 01/25/24 0930 01/28/24 0739  NA 136 138 137 138  K 3.9 3.9 3.6 4.2  CL 103 101 102 103  CO2 20* 24 25 25   GLUCOSE 118* 108* 116* 97  BUN 21* 20 19 23*  CREATININE 0.46* 0.64 0.41* 0.55*  CALCIUM 9.3 9.6 9.2 9.6  MG  --   --  2.0 2.1   GFR: Estimated Creatinine Clearance: 125 mL/min (A) (by C-G formula based on SCr of 0.55 mg/dL (L)). Liver Function Tests: No results for input(s): "AST", "ALT", "ALKPHOS", "BILITOT", "PROT", "ALBUMIN " in the last 168 hours. No results for input(s): "LIPASE", "AMYLASE" in the last 168 hours. No results for input(s): "AMMONIA" in the last 168 hours. Coagulation Profile: No results for input(s): "INR", "PROTIME" in the last 168 hours. Cardiac Enzymes: No results for input(s): "CKTOTAL", "CKMB", "CKMBINDEX", "TROPONINI" in the last 168 hours. BNP (last 3 results) No results for input(s): "PROBNP" in the last 8760 hours. HbA1C: No results for input(s): "HGBA1C" in the last 72 hours. CBG: Recent Labs  Lab 01/27/24 1233 01/27/24 1649 01/27/24 2056 01/28/24 0630 01/28/24 0959  GLUCAP 200* 87 84 107* 137*   Lipid Profile: No results for input(s): "CHOL", "HDL", "LDLCALC",  "TRIG", "CHOLHDL", "LDLDIRECT" in the last 72 hours. Thyroid Function Tests: No results for input(s): "TSH", "T4TOTAL", "FREET4", "T3FREE", "THYROIDAB" in the last 72 hours. Anemia Panel: No results for input(s): "VITAMINB12", "FOLATE", "FERRITIN", "TIBC", "IRON", "RETICCTPCT" in the last 72 hours. Sepsis Labs: No results for input(s): "PROCALCITON", "LATICACIDVEN" in the last 168 hours.  Recent Results (from the past 240 hours)  Culture, blood (Routine X 2) w Reflex to ID Panel     Status: None   Collection Time: 01/20/24  8:32 PM   Specimen: BLOOD  Result Value Ref Range  Status   Specimen Description BLOOD SITE NOT SPECIFIED  Final   Special Requests   Final    BOTTLES DRAWN AEROBIC AND ANAEROBIC Blood Culture results may not be optimal due to an inadequate volume of blood received in culture bottles   Culture   Final    NO GROWTH 5 DAYS Performed at [facility], [address] [place], [name] [address]    Report Status 01/25/2024 FINAL  Final  Culture, blood (Routine X 2) w Reflex to ID Panel     Status: None   Collection Time: 01/20/24  8:32 PM   Specimen: BLOOD  Result Value Ref Range Status   Specimen Description BLOOD SITE NOT SPECIFIED  Final   Special Requests   Final    BOTTLES DRAWN AEROBIC AND ANAEROBIC Blood Culture results may not be optimal due to an inadequate volume of blood received in culture bottles   Culture   Final    NO GROWTH 5 DAYS Performed at [facility], [address] [place], [name] [address]    Report Status 01/25/2024 FINAL  Final  Respiratory (~20 pathogens) panel by PCR     Status: None   Collection Time: 01/20/24  8:40 PM   Specimen: Nasopharyngeal Swab; Respiratory  Result Value Ref Range Status   Adenovirus NOT DETECTED NOT DETECTED Final   Coronavirus 229E NOT DETECTED NOT DETECTED Final    Comment: (NOTE) The Coronavirus on the Respiratory Panel, DOES NOT test for the novel  Coronavirus (2019 nCoV)    Coronavirus HKU1  NOT DETECTED NOT DETECTED Final   Coronavirus NL63 NOT DETECTED NOT DETECTED Final   Coronavirus OC43 NOT DETECTED NOT DETECTED Final   Metapneumovirus NOT DETECTED NOT DETECTED Final   Rhinovirus / Enterovirus NOT DETECTED NOT DETECTED Final   Influenza A NOT DETECTED NOT DETECTED Final   Influenza B NOT DETECTED NOT DETECTED Final   Parainfluenza Virus 1 NOT DETECTED NOT DETECTED Final   Parainfluenza Virus 2 NOT DETECTED NOT DETECTED Final   Parainfluenza Virus 3 NOT DETECTED NOT DETECTED Final   Parainfluenza Virus 4 NOT DETECTED NOT DETECTED Final   Respiratory Syncytial Virus NOT DETECTED NOT DETECTED Final   Bordetella pertussis NOT DETECTED NOT DETECTED Final   Bordetella Parapertussis NOT DETECTED NOT DETECTED Final   Chlamydophila pneumoniae NOT DETECTED NOT DETECTED Final   Mycoplasma pneumoniae NOT DETECTED NOT DETECTED Final    Comment: Performed at [facility], [address], [place], [name] [address]  Resp panel by RT-PCR (RSV, Flu A&B, Covid) Anterior Nasal Swab     Status: None   Collection Time: 01/20/24  8:40 PM   Specimen: Anterior Nasal Swab  Result Value Ref Range Status   SARS Coronavirus 2 by RT PCR NEGATIVE NEGATIVE Final   Influenza A by PCR NEGATIVE NEGATIVE Final   Influenza B by PCR NEGATIVE NEGATIVE Final    Comment: (NOTE) The Xpert Xpress SARS-CoV-2/FLU/RSV plus assay is intended as an aid in the diagnosis of influenza from Nasopharyngeal swab specimens and should not be used as a sole basis for treatment. Nasal washings and aspirates are unacceptable for Xpert Xpress SARS-CoV-2/FLU/RSV testing.  Fact Sheet for Patients: BloggerCourse.com  Fact Sheet for Healthcare Providers: SeriousBroker.it  This test is not yet approved or cleared by the [place]  FDA and has been authorized for detection and/or diagnosis of SARS-CoV-2 by FDA under an Emergency Use Authorization (EUA). This EUA will  remain in effect (meaning this test  can be used) for the duration of the COVID-19 declaration under Section 564(b)(1) of the Act, 21 U.S.C. section 360bbb-3(b)(1), unless the authorization is terminated or revoked.     Resp Syncytial Virus by PCR NEGATIVE NEGATIVE Final    Comment: (NOTE) Fact Sheet for Patients: BloggerCourse.com  Fact Sheet for Healthcare Providers: SeriousBroker.it  This test is not yet approved or cleared by the [place]  FDA and has been authorized for detection and/or diagnosis of SARS-CoV-2 by FDA under an Emergency Use Authorization (EUA). This EUA will remain in effect (meaning this test can be used) for the duration of the COVID-19 declaration under Section 564(b)(1) of the Act, 21 U.S.C. section 360bbb-3(b)(1), unless the authorization is terminated or revoked.  Performed at [facility], [address], [place], [name] [address]   Culture, Respiratory w Gram Stain     Status: None   Collection Time: 01/21/24  5:55 AM   Specimen: Tracheal Aspirate  Result Value Ref Range Status   Specimen Description TRACHEAL ASPIRATE  Final   Special Requests NONE  Final   Gram Stain   Final    FEW WBC PRESENT, PREDOMINANTLY PMN FEW GRAM NEGATIVE RODS FEW GRAM POSITIVE COCCI RARE GRAM POSITIVE RODS Performed at [facility], [address] [place], [name] [address]    Culture   Final    MODERATE METHICILLIN RESISTANT STAPHYLOCOCCUS AUREUS   Report Status 01/23/2024 FINAL  Final   Organism ID, Bacteria METHICILLIN RESISTANT STAPHYLOCOCCUS AUREUS  Final      Susceptibility   Methicillin resistant staphylococcus aureus - MIC*    CIPROFLOXACIN <=0.5 SENSITIVE Sensitive     ERYTHROMYCIN  >=8 RESISTANT Resistant     GENTAMICIN <=0.5 SENSITIVE Sensitive     OXACILLIN >=4 RESISTANT Resistant     TETRACYCLINE >=16 RESISTANT Resistant     VANCOMYCIN  <=0.5 SENSITIVE Sensitive     TRIMETH/SULFA <=10  SENSITIVE Sensitive     CLINDAMYCIN <=0.25 SENSITIVE Sensitive     RIFAMPIN <=0.5 SENSITIVE Sensitive     Inducible Clindamycin NEGATIVE Sensitive     LINEZOLID  2 SENSITIVE Sensitive     * MODERATE METHICILLIN RESISTANT STAPHYLOCOCCUS AUREUS         Radiology Studies: No results found.         LOS: 93 days   Time spent= 35 mins    Maggie Schooner, MD [facility]  If 7PM-7AM, please contact night-coverage  01/28/2024, 12:06 PM

## 2024-01-28 NOTE — TOC Progression Note (Signed)
 Transition of Care [facility]) - Progression Note    Patient Details  Name: Garrett [name] MRN: [id number] Date of Birth: July 20, 1976  Transition of Care [facility]) CM/SW Contact  Tandy Fam, [name] Phone Number: 01/28/2024, 11:17 AM  Clinical Narrative:   CSW sent message to [facility] Director about patient needing LOG placement, awaiting response.    Expected Discharge Plan: Long Term Nursing Home Barriers to Discharge: Continued Medical Work up, SNF Pending Medicaid, SNF Pending bed offer, SNF Pending payor source - LOG, Inadequate or no insurance (New trach)  Expected Discharge Plan and Services In-house Referral: Clinical Social Work, Hospice / Palliative Care   Post Acute Care Choice: Skilled Nursing Facility Living arrangements for the past 2 months: Single Family Home                                       Social Determinants of Health (SDOH) Interventions SDOH Screenings   Food Insecurity: Patient Unable To Answer (10/29/2023)  Social Connections: Unknown (06/23/2023)   Received from [facility]  Tobacco Use: High Risk (10/31/2023)    Readmission Risk Interventions     No data to display

## 2024-01-28 NOTE — Progress Notes (Signed)
 NAME:  Garrett [name], MRN:  [id number], DOB:  1976/08/11, LOS: 93 ADMISSION DATE:  10/27/2023, CONSULTATION DATE:  10/27/2023 REFERRING MD:  Florentino Hurdle, DO CHIEF COMPLAINT:  Found Down  History of Present Illness:  48 y/o male with past medical history of hypertension who was found down for unknown downtime by his fiance when she came home from work. Reportedly, having agonal breathing.  He apparently had driven her to work this morning.  He has HTN but never checks his BP and does not follow with MD.  She says you can tell when his BP is really high; his eyes get blood shot and he is sweating.  No h/o seizures.  Besides almost daily [name] and cigarette smoking, she is not aware of any other drugs. Drug screen positive for opioids and he was given several doses of Narcan . Patient found to have:  1. 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 Basal cisterns and fourth ventricle are effaced without hydrocephalus at this time. 2. Trace additional subarachnoid hemorrhage along the left parietal convexity. BP in ED 262/156. His CXR revealed a RUL and perihilar infiltrates suggesting aspiration pneumonia. ED labs also revealed PH 7.169, LA 4.4, CPK 985. Patient was intubated in ED.  Pertinent  Medical History  hypertension  Significant Hospital Events: Including procedures, antibiotic start and stop dates in addition to other pertinent events   3/8: Intubated in ED, pontine bleed/SAH. CT head 17:09 Acute large hemorrhage 4.1 cm in pons with intraventricular extension without hydrocephalus 3/9: CTA 03:14 AM No change in IPH in pons, similar biparietal [facility] 3/14: Now awake, appears locked in can follow commands with eyes. 3/16 Purposeful with left upper extremity  3/22 tracheostomy placed at bedside, bleeding issues overnight from trach site 3/24 PEG (Dr. Hildy Lowers) 3/24  sputum culture with MRSA 3/27 vomited. TF on hold. Abd film c/w ileus. Added reglan , got SSE. Did tolerate PSV all day  3/28 BMs x2 after SSE day prior. Added back TFs at 1/2 rated. Tolerated PS 3/29 Tolerated PS  3/31 Tolerating CPAP PS 15/5, TF on hold due to ileus. 4/1: con't to hold tube feeds today, restarting vancomycin  and sending tracheal aspirate as peaks are 37, plat 24, driving 19. Thick secretions. Fever overnight.  4/2: peaks improved. Ongoing hiccups. Trickle feeding. Neuro exam unchanged.  4/20: trach was changed to cuffless #8 4/28: not safe to swallow. Limited communication and he follows simple commands. On TF 5/1: on TC 28%, with large secretions. Working with speech and he is improving to initiate PMV trials under ST supervision   5/5: on TC 30%, 7 L/min. Trach #6 cuffless, changed 5/1 to facilitate PMV trials  5/12: on TC 21%, 6 L/min. Trach #6 cuffless, unable to produce sounds or speak on PMV. No significant resp secretions. On PEG TF  Interim History / Subjective:   No events Seems to be getting more responsive over time  Objective   Blood pressure (!) 140/105, pulse 80, temperature 97.8 F (36.6 C), temperature source Axillary, resp. rate 19, height 5\' 8"  (1.727 m), weight 91 kg, SpO2 99%.    FiO2 (%):  [21 %] 21 %   Intake/Output Summary (Last 24 hours) at 01/28/2024 1027 Last data filed at 01/28/2024 0508 Gross per 24 hour  Intake 1524 ml  Output 1950 ml  Net -426 ml   Filed Weights   01/26/24 0500 01/27/24 0500 01/28/24 0500  Weight:  91 kg 91 kg 91 kg   Physical Exam: Nodding head appropriately Trach minimal secretions Some oral secretions may be problematic down the line Minimal movement of ext for me  Assessment & Plan:  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  Acute hypoxic respiratory failure s/p tracheostomy  Continue trach collar Okay to drop to 4-0 cuffless this week, discussed with RT Will check on 6/11 and see if  ready for capping trial  Garrett Nigh, MD Garrett [name] Pulmonary/Critical Care Please consult Amion 01/28/2024, 10:27 AM

## 2024-01-28 NOTE — Plan of Care (Signed)
   Problem: Education: Goal: Knowledge of disease or condition will improve Outcome: Progressing Goal: Knowledge of secondary prevention will improve (MUST DOCUMENT ALL) Outcome: Progressing Goal: Knowledge of patient specific risk factors will improve (DELETE if not current risk factor) Outcome: Progressing

## 2024-01-28 NOTE — Progress Notes (Signed)
   01/28/24 1249  Tracheostomy Shiley Flexible 4 mm Uncuffed  Placement Date/Time: 01/28/24 1248   Expiration Date: 02/25/24  Placed By: Self  Brand: Shiley Flexible  Size (mm): 4 mm  Style: Uncuffed  Status Secured with trach ties  Site Assessment Clean;Dry  Site Care Cleansed;Dried;Dressing applied  Inner Cannula Care Changed/new  Ties Assessment Clean, Dry;Changed  Cuff Pressure (cm H2O)  (cfs)  Trach Changed Yes  Tracheostomy Equipment at bedside Yes and checklist posted at head of bed   Pt's tracheostomy tube was changed from 6 shiley XLT distal to 4 shiley flex and is tolerating well at this time. Bilateral breath sounds were auscultated and positive end tidal color change was noted. Pt VSS at this time

## 2024-01-29 DIAGNOSIS — I612 Nontraumatic intracerebral hemorrhage in hemisphere, unspecified: Secondary | ICD-10-CM | POA: Diagnosis not present

## 2024-01-29 LAB — GLUCOSE, CAPILLARY
Glucose-Capillary: 119 mg/dL — ABNORMAL HIGH (ref 70–99)
Glucose-Capillary: 121 mg/dL — ABNORMAL HIGH (ref 70–99)
Glucose-Capillary: 132 mg/dL — ABNORMAL HIGH (ref 70–99)
Glucose-Capillary: 136 mg/dL — ABNORMAL HIGH (ref 70–99)
Glucose-Capillary: 154 mg/dL — ABNORMAL HIGH (ref 70–99)
Glucose-Capillary: 169 mg/dL — ABNORMAL HIGH (ref 70–99)

## 2024-01-29 NOTE — Progress Notes (Signed)
 PROGRESS NOTE    Garrett [name]  ZOX:[id number] DOB: 1975/09/09 DOA: 10/27/2023 PCP: Pcp, No    Brief Narrative:  48 year old with no known previous medical history was found down on 10/27/2023, unresponsive with UDS positive for opioids.  He was minimally responsive to Narcan  and he was found to be markedly hypertensive-  blood pressure 262/156.  CT head notable for 4.1 cm intracranial hemorrhage at the pons with intraventricular extension into the fourth ventricle and basal cisterns.  Initially admitted by critical care, and now extubated with tracheostomy, initially placed on 3/20, replaced on 01/05/2024.  PEG tube placed 11/08/2023.   Remains with poor response.  Stable on trach and PEG.  Disposition remains difficult given patient's need for PEG and tracheostomy tube care.  Currently awaiting disability.   Significant hospital events: 3/8: Intubated in ED, pontine bleed/SAH. CT head Acute large hemorrhage 4.1 cm in pons with intraventricular extension without hydrocephalus. 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  Can follow commands with eyes. 3/16 Purposeful with left upper extremity  3/22 tracheostomy placed at bedside, bleeding issues overnight from trach site 3/24 PEG 3/24 sputum culture with MRSA 4/20: trach was changed to cuffless #8 5/1: Mental status appears to be somewhat improving off narcotics, more awake alert following simple commands(able to grimace, move left toes, track vertically with right eye).  Speech following. Trach downsized to [employer] cuffless #6 XLT 5/4: Modafinil  held, low threshold to resume if mental status/ability to interact diminishes 5/10: PEG dislodged, foley bulb placed and IR consulted to replace tube 5/11: IR replaced PEG tube 5/13 - 5/14: + Fever.  Restarted antibiotics.  Urine culture + E. Coli, foley catheter changed. 5/17 Tracheostomy changed.   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  CT head with acute large hemorrhage 4.1 cm in the pons, ventricular extension. blood pressure 260/1 50s on admission. Was admitted to the ICU and followed by Neuro.  - 2D echo ejection fraction 60%, A1c 4.7, LDL 96. - Was also treated with 3% hypertonic saline.  Follow-up CT head 3/29 with reduction in size of hemorrhage. -Patient now has trach and PEG in place. PCCM following.  Speech therapy, PT OT is following.   Acute respiratory failure secondary to large pontine stroke brainstem compression Unable to protect airway/possible aspiration pneumonia - Now has trach collar.   PCCM intermittently following.  Recommended not a candidate for decannulation.  On 6/1 became tachycardic, tachypneic and febrile.  Chest x-ray unremarkable and procalcitonin was negative.  Initially started on vancomycin  and cefepime  later transitioned to vancomycin  as sputum cultures grew MRSA   Hypertension: Continue Norvasc , losartan , Lopressor    Transaminases: Improved.  CT abdomen pelvis negative, acute hepatitis panel negative.    E. coli UTI  completed antibiotics course 5/18 Foley catheter was changed 5/13.  Should change it again on 01/31/24   Moderate to severe protein caloric malnutrition, POA Peg in place, TF per RD.    PEG tube malfunction, resolved PEG dislodged 5/10,  replaced on 5/11 by IR   Exposure keratopathy, OS Seen by ophthalmology, Dr. Mason Sole on 4/7.  Treated with  maxitrol ointment (neo-poly-dex) TID OS for 1 week. -- Refresh PM TID to QID OS for chronic lubrication   AKI with acute urinary retention Resolved.  Has Foley in place   Macrocytic anemia thrombocytosis  stable/resolved   Perineal skin injury, POA  Continue Gerhard's Butt cream   Relative B12 and folate deficiency Continue replacement with MVI    Goal of care: Currently DNR with intervention.   Nutrition Problem: Inadequate oral intake Etiology: inability to eat   Interventions: Tube feeding, Prostat, MVI, Juven   DVT prophylaxis: Lovenox    Code Status: DNR Family Communication:     Status is: Inpatient Remains inpatient appropriate because: Awaiting placement     Estimated body mass index is 29.83 kg/m as calculated from the following:   Height as of this encounter: 5\' 8"  (1.727 m).   Weight as of this encounter: 89 kg.   Consultants:  Ophthalmology, general surgery, palliative care, critical care   Subjective: Opens his eyes and slightly tracks me across the room.  But no other meaningful interaction Examination:  General exam: Appears calm and comfortable, no meaningful interaction.  Exophthalmos is noted.  Erythema of his left eye, chronic Respiratory system: Clear to auscultation. Respiratory effort normal. Cardiovascular system: S1 & S2 heard, RRR. No JVD, murmurs, rubs, gallops or clicks. No pedal edema. Gastrointestinal system: Abdomen is nondistended, soft and nontender. No organomegaly or masses felt. Normal bowel sounds heard. Central nervous system: Overall alert but difficult to assess as patient is nonverbal.  Does not really follow commands Skin: No rashes, lesions or ulcers Psychiatry: Unable to assess Peg tube in place Foley catheter in place Tracheostomy in place               Diet Orders (From admission, onward)     Start     Ordered   11/10/23 1245  Diet NPO time specified  (Pre Percutaneous Dilitation Tracheostomy)  Diet  effective now       Comments: Clarify time of tube feed discontinuation with provider.   11/10/23 1245            Objective: Vitals:   01/29/24 0016 01/29/24 0315 01/29/24 0456 01/29/24 0849  BP: (!) 151/93  (!) 117/99 (!) 150/103  Pulse: 96 86 92 97  Resp: 18 19 20 19   Temp: 98.8 F (37.1 C)  98.5 F (36.9 C) 98.7 F (37.1 C)  TempSrc: Oral  Axillary Oral  SpO2: 100% 99% 98% 99%  Weight:      Height:        Intake/Output Summary (Last 24 hours) at 01/29/2024 1059 Last data filed at 01/29/2024 8295 Gross per 24 hour  Intake 594 ml  Output 1350 ml  Net -756 ml   Filed Weights   01/26/24 0500 01/27/24 0500 01/28/24 0500  Weight: 91 kg 91 kg 91 kg    Scheduled Meds:  amLODipine   10 mg Per Tube Daily   artificial tears   Left Eye Q8H   Chlorhexidine  Gluconate Cloth  6 each Topical Daily   enoxaparin  (LOVENOX ) injection  40 mg Subcutaneous Daily   famotidine   20 mg Per Tube BID   feeding supplement (OSMOLITE 1.5 CAL)  237 mL Per Tube 5 X Daily   feeding supplement (PROSource TF20)  60 mL Per Tube TID   free water   150 mL Per Tube Q4H   insulin  aspart  0-15 Units Subcutaneous 5 times per day   losartan   100 mg Per Tube Daily   metoprolol  tartrate  100 mg Per Tube BID   multivitamin with minerals  1 tablet Per Tube Daily   mouth rinse  15 mL Mouth Rinse 4 times per day   Continuous Infusions:  Nutritional status Signs/Symptoms: NPO status Interventions: Tube feeding, Prostat, MVI, Juven Body mass index is 30.5 kg/m.  Data Reviewed:   CBC: Recent Labs  Lab 01/24/24 1026 01/25/24 0930 01/28/24 0739  WBC 6.2 8.2 7.8  NEUTROABS 3.8  --   --   HGB 12.1* 11.5* 12.5*  HCT 39.0 36.3* 40.1  MCV 92.4 91.0 92.4  PLT 552* 546* 586*   Basic Metabolic Panel: Recent Labs  Lab 01/23/24 0530 01/24/24 1026 01/25/24 0930 01/28/24 0739  NA 136 138 137 138  K 3.9 3.9 3.6 4.2  CL 103 101 102 103  CO2 20* 24 25 25   GLUCOSE 118* 108* 116* 97  BUN 21* 20 19 23*   CREATININE 0.46* 0.64 0.41* 0.55*  CALCIUM 9.3 9.6 9.2 9.6  MG  --   --  2.0 2.1   GFR: Estimated Creatinine Clearance: 125 mL/min (A) (by C-G formula based on SCr of 0.55 mg/dL (L)). Liver Function Tests: No results for input(s): "AST", "ALT", "ALKPHOS", "BILITOT", "PROT", "ALBUMIN " in the last 168 hours. No results for input(s): "LIPASE", "AMYLASE" in the last 168 hours. No results for input(s): "AMMONIA" in the last 168 hours. Coagulation Profile: No results for input(s): "INR", "PROTIME" in the last 168 hours. Cardiac Enzymes: No results for input(s): "CKTOTAL", "CKMB", "CKMBINDEX", "TROPONINI" in the last 168 hours. BNP (last 3 results) No results for input(s): "PROBNP" in the last 8760 hours. HbA1C: No results for input(s): "HGBA1C" in the last 72 hours. CBG: Recent Labs  Lab 01/28/24 1416 01/28/24 1656 01/28/24 2126 01/29/24 0641 01/29/24 1024  GLUCAP 132* 129* 89 121* 136*   Lipid Profile: No results for input(s): "CHOL", "HDL", "LDLCALC", "TRIG", "CHOLHDL", "LDLDIRECT" in the last 72 hours. Thyroid Function Tests: No results for input(s): "TSH", "T4TOTAL", "FREET4", "T3FREE", "THYROIDAB" in the last 72 hours. Anemia Panel: No results for input(s): "VITAMINB12", "FOLATE", "FERRITIN", "TIBC", "IRON", "RETICCTPCT" in the last 72 hours. Sepsis Labs: No results for input(s): "PROCALCITON", "LATICACIDVEN" in the last 168 hours.  Recent Results (from the past 240 hours)  Culture, blood (Routine X 2) w Reflex to ID Panel     Status: None   Collection Time: 01/20/24  8:32 PM   Specimen: BLOOD  Result Value Ref Range Status   Specimen Description BLOOD SITE NOT SPECIFIED  Final   Special Requests   Final    BOTTLES DRAWN AEROBIC AND ANAEROBIC Blood Culture results may not be optimal due to an inadequate volume of blood received in culture bottles   Culture   Final    NO GROWTH 5 DAYS Performed at [facility], [address] [place], [name] [address]     Report Status 01/25/2024 FINAL  Final  Culture, blood (Routine X 2) w Reflex to ID Panel     Status: None   Collection Time: 01/20/24  8:32 PM   Specimen: BLOOD  Result Value Ref Range Status   Specimen Description BLOOD SITE NOT SPECIFIED  Final   Special Requests   Final    BOTTLES DRAWN AEROBIC AND ANAEROBIC Blood Culture results may not be optimal due to an inadequate volume of blood received in culture bottles   Culture  Final    NO GROWTH 5 DAYS Performed at [facility], [address] [place], [name] [address]    Report Status 01/25/2024 FINAL  Final  Respiratory (~20 pathogens) panel by PCR     Status: None   Collection Time: 01/20/24  8:40 PM   Specimen: Nasopharyngeal Swab; Respiratory  Result Value Ref Range Status   Adenovirus NOT DETECTED NOT DETECTED Final   Coronavirus 229E NOT DETECTED NOT DETECTED Final    Comment: (NOTE) The Coronavirus on the Respiratory Panel, DOES NOT test for the novel  Coronavirus (2019 nCoV)    Coronavirus HKU1 NOT DETECTED NOT DETECTED Final   Coronavirus NL63 NOT DETECTED NOT DETECTED Final   Coronavirus OC43 NOT DETECTED NOT DETECTED Final   Metapneumovirus NOT DETECTED NOT DETECTED Final   Rhinovirus / Enterovirus NOT DETECTED NOT DETECTED Final   Influenza A NOT DETECTED NOT DETECTED Final   Influenza B NOT DETECTED NOT DETECTED Final   Parainfluenza Virus 1 NOT DETECTED NOT DETECTED Final   Parainfluenza Virus 2 NOT DETECTED NOT DETECTED Final   Parainfluenza Virus 3 NOT DETECTED NOT DETECTED Final   Parainfluenza Virus 4 NOT DETECTED NOT DETECTED Final   Respiratory Syncytial Virus NOT DETECTED NOT DETECTED Final   Bordetella pertussis NOT DETECTED NOT DETECTED Final   Bordetella Parapertussis NOT DETECTED NOT DETECTED Final   Chlamydophila pneumoniae NOT DETECTED NOT DETECTED Final   Mycoplasma pneumoniae NOT DETECTED NOT DETECTED Final    Comment: Performed at [facility], [address], [place], [name]  [address]  Resp panel by RT-PCR (RSV, Flu A&B, Covid) Anterior Nasal Swab     Status: None   Collection Time: 01/20/24  8:40 PM   Specimen: Anterior Nasal Swab  Result Value Ref Range Status   SARS Coronavirus 2 by RT PCR NEGATIVE NEGATIVE Final   Influenza A by PCR NEGATIVE NEGATIVE Final   Influenza B by PCR NEGATIVE NEGATIVE Final    Comment: (NOTE) The Xpert Xpress SARS-CoV-2/FLU/RSV plus assay is intended as an aid in the diagnosis of influenza from Nasopharyngeal swab specimens and should not be used as a sole basis for treatment. Nasal washings and aspirates are unacceptable for Xpert Xpress SARS-CoV-2/FLU/RSV testing.  Fact Sheet for Patients: BloggerCourse.com  Fact Sheet for Healthcare Providers: SeriousBroker.it  This test is not yet approved or cleared by the [place]  FDA and has been authorized for detection and/or diagnosis of SARS-CoV-2 by FDA under an Emergency Use Authorization (EUA). This EUA will remain in effect (meaning this test can be used) for the duration of the COVID-19 declaration under Section 564(b)(1) of the Act, 21 U.S.C. section 360bbb-3(b)(1), unless the authorization is terminated or revoked.     Resp Syncytial Virus by PCR NEGATIVE NEGATIVE Final    Comment: (NOTE) Fact Sheet for Patients: BloggerCourse.com  Fact Sheet for Healthcare Providers: SeriousBroker.it  This test is not yet approved or cleared by the [place]  FDA and has been authorized for detection and/or diagnosis of SARS-CoV-2 by FDA under an Emergency Use Authorization (EUA). This EUA will remain in effect (meaning this test can be used) for the duration of the COVID-19 declaration under Section 564(b)(1) of the Act, 21 U.S.C. section 360bbb-3(b)(1), unless the authorization is terminated or revoked.  Performed at [facility], [address] [place],  [name] [address]   Culture, Respiratory w Gram Stain     Status: None   Collection Time: 01/21/24  5:55 AM   Specimen:  Tracheal Aspirate  Result Value Ref Range Status   Specimen Description TRACHEAL ASPIRATE  Final   Special Requests NONE  Final   Gram Stain   Final    FEW WBC PRESENT, PREDOMINANTLY PMN FEW GRAM NEGATIVE RODS FEW GRAM POSITIVE COCCI RARE GRAM POSITIVE RODS Performed at [facility], [address], [place], [name] [address]    Culture   Final    MODERATE METHICILLIN RESISTANT STAPHYLOCOCCUS AUREUS   Report Status 01/23/2024 FINAL  Final   Organism ID, Bacteria METHICILLIN RESISTANT STAPHYLOCOCCUS AUREUS  Final      Susceptibility   Methicillin resistant staphylococcus aureus - MIC*    CIPROFLOXACIN <=0.5 SENSITIVE Sensitive     ERYTHROMYCIN  >=8 RESISTANT Resistant     GENTAMICIN <=0.5 SENSITIVE Sensitive     OXACILLIN >=4 RESISTANT Resistant     TETRACYCLINE >=16 RESISTANT Resistant     VANCOMYCIN  <=0.5 SENSITIVE Sensitive     TRIMETH/SULFA <=10 SENSITIVE Sensitive     CLINDAMYCIN <=0.25 SENSITIVE Sensitive     RIFAMPIN <=0.5 SENSITIVE Sensitive     Inducible Clindamycin NEGATIVE Sensitive     LINEZOLID  2 SENSITIVE Sensitive     * MODERATE METHICILLIN RESISTANT STAPHYLOCOCCUS AUREUS         Radiology Studies: No results found.         LOS: 94 days   Time spent= 35 mins    Maggie Schooner, MD [facility]  If 7PM-7AM, please contact night-coverage  01/29/2024, 10:59 AM

## 2024-01-29 NOTE — Progress Notes (Signed)
 Physical Therapy Treatment Patient Details Name: Garrett [name] MRN: [id number] DOB: Jan 08, 1976 Today's Date: 01/29/2024   History of Present Illness Pt is a 48 yo male who was found down 10/27/23 with agonal breathing. 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 Intubated 3/8, cortrak placed 3/14, trach placed 3/22, PEG placed 3/24. PMH: HTN, smoker    PT Comments  Pt with similar presentation to previous session. Pt continues to require +2 total A for all mobility. Pt unable to nod yes/no today but noted with some visual tracking. Session limited by BM. No change in DC/DME recs at this time. PT will continue to follow. Goals to be updated this session.    If plan is discharge home, recommend the following: Two people to help with walking and/or transfers;Two people to help with bathing/dressing/bathroom;Assistance with cooking/housework;Assistance with feeding;Direct supervision/assist for medications management;Direct supervision/assist for financial management;Assist for transportation;Help with stairs or ramp for entrance;Supervision due to cognitive status   Can travel by private vehicle        Equipment Recommendations  [place] lift;Hospital bed;Wheelchair (measurements PT);Wheelchair cushion (measurements PT);BSC/3in1;Other (comment)    Recommendations for Other Services       Precautions / Restrictions Precautions Precautions: Fall Recall of Precautions/Restrictions: Impaired Precaution/Restrictions Comments: trach, PEG, abdominal binder, SBP < 160 Required Braces or Orthoses: Other Brace Splint/Cast: Bil resting hand splints Other Brace: has bilateral prevalon boots Restrictions Weight Bearing Restrictions Per Provider Order: No     Mobility  Bed Mobility Overal bed mobility: Needs Assistance Bed Mobility:  Rolling, Supine to Sit, Sit to Supine Rolling: +2 for physical assistance, +2 for safety/equipment, Total assist   Supine to sit: Total assist, +2 for physical assistance Sit to supine: Total assist, +2 for physical assistance   General bed mobility comments: moving legs to EOB and lifting trunk; to supine A for trunk and legs and scooting up in bed/positioning for proper posture/respirations and trunk control    Transfers                        Ambulation/Gait                   Stairs             Wheelchair Mobility     Tilt Bed    Modified Rankin (Stroke Patients Only) Modified Rankin (Stroke Patients Only) Pre-Morbid Rankin Score: No symptoms Modified Rankin: Severe disability     Balance Overall balance assessment: Needs assistance Sitting-balance support: Feet supported, No upper extremity supported Sitting balance-Leahy Scale: Zero                                      Communication Communication Communication: Impaired Factors Affecting Communication: Trach/intubated  Cognition Arousal: Alert Behavior During Therapy: Flat affect   PT - Cognitive impairments: Difficult to assess Difficult to assess due to: Tracheostomy, Impaired communication                     PT - Cognition Comments: Pt able to track with eyes slightly however unable to nod today. Following commands: Impaired Following commands impaired: Follows one step commands inconsistently, Follows one step commands with increased time    Cueing Cueing Techniques: Verbal cues, Visual cues  Exercises Other Exercises Other Exercises: PROM bilat LE's in supine    General Comments General comments (skin integrity, edema, etc.): VSS      Pertinent Vitals/Pain Pain Assessment Pain Assessment: Faces Faces Pain Scale: No hurt    Home Living                          Prior Function            PT Goals (current goals can now be found in  the care plan section) Progress towards PT goals: Progressing toward goals    Frequency    Min 2X/week      PT Plan      Co-evaluation              AM-PAC PT "6 Clicks" Mobility   Outcome Measure  Help needed turning from your back to your side while in a flat bed without using bedrails?: Total Help needed moving from lying on your back to sitting on the side of a flat bed without using bedrails?: Total Help needed moving to and from a bed to a chair (including a wheelchair)?: Total Help needed standing up from a chair using your arms (e.g., wheelchair or bedside chair)?: Total Help needed to walk in hospital room?: Total Help needed climbing 3-5 steps with a railing? : Total 6 Click Score: 6    End of Session Equipment Utilized During Treatment: Oxygen Activity Tolerance: Patient tolerated treatment well;Other (comment) (Limited by BM) Patient left: in bed;with call bell/phone within reach Nurse Communication: Need for lift equipment PT Visit Diagnosis: Muscle weakness (generalized) (M62.81);Other symptoms and signs involving the nervous system (R29.898);Hemiplegia and hemiparesis Hemiplegia - Right/Left: Right Hemiplegia - dominant/non-dominant: Dominant Hemiplegia - caused by: Nontraumatic intracerebral hemorrhage     Time: 9147-8295 PT Time Calculation (min) (ACUTE ONLY): 15 min  Charges:    $Therapeutic Activity: 8-22 mins PT General Charges $$ ACUTE PT VISIT: 1 Visit                     Rodgers Clack, PT, DPT Acute Rehab Services [phone]    Jahmeek Shirk 01/29/2024, 3:16 PM

## 2024-01-30 DIAGNOSIS — Z430 Encounter for attention to tracheostomy: Secondary | ICD-10-CM

## 2024-01-30 DIAGNOSIS — I619 Nontraumatic intracerebral hemorrhage, unspecified: Secondary | ICD-10-CM | POA: Diagnosis not present

## 2024-01-30 LAB — GLUCOSE, CAPILLARY
Glucose-Capillary: 110 mg/dL — ABNORMAL HIGH (ref 70–99)
Glucose-Capillary: 164 mg/dL — ABNORMAL HIGH (ref 70–99)
Glucose-Capillary: 98 mg/dL (ref 70–99)
Glucose-Capillary: 129 mg/dL — ABNORMAL HIGH (ref 70–99)
Glucose-Capillary: 140 mg/dL — ABNORMAL HIGH (ref 70–99)

## 2024-01-30 MED ORDER — BANATROL TF EN LIQD
60.0000 mL | Freq: Two times a day (BID) | ENTERAL | Status: DC
  Administered 2024-01-30 – 2024-03-07 (×64): 60 mL
  Filled 2024-01-30 (×72): qty 60

## 2024-01-30 NOTE — Progress Notes (Signed)
 Occupational Therapy Treatment Patient Details Name: Garrett [name] MRN: [id number] DOB: June 15, 1976 Today's Date: 01/30/2024   History of present illness Pt is a 48 yo male who was found down 10/27/23 with agonal breathing. 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 Intubated 3/8, cortrak placed 3/14, trach placed 3/22, PEG placed 3/24. PMH: HTN, smoker   OT comments  Pt supine in bed, soiled upon entry.  +2 total assist to roll and complete hygiene from bed level.  Pt able to demonstrate squeeze of ball and initiation of shoulder flexion (minimal) with L UE to raise UE during ADLs. PROM to BUEs provided.  VSS during session on TC, NT in room finishing bath upon exit.       If plan is discharge home, recommend the following:  Two people to help with walking and/or transfers;Two people to help with bathing/dressing/bathroom;Direct supervision/assist for medications management;Direct supervision/assist for financial management;Assist for transportation;Help with stairs or ramp for entrance   Equipment Recommendations  Wheelchair (measurements OT);Wheelchair cushion (measurements OT);Hospital bed;Hoyer lift    Recommendations for Other Services      Precautions / Restrictions Precautions Precautions: Fall Recall of Precautions/Restrictions: Impaired Precaution/Restrictions Comments: trach, PEG, abdominal binder, SBP < 160 Required Braces or Orthoses: Other Brace Splint/Cast: Bil resting hand splints Other Brace: has bilateral prevalon boots       Mobility Bed Mobility Overal bed mobility: Needs Assistance Bed Mobility: Rolling Rolling: +2 for physical assistance, Total assist         General bed mobility comments: total assist +2 to roll, pull up in bed    Transfers                         Balance                                            ADL either performed or assessed with clinical judgement   ADL Overall ADL's : Needs assistance/impaired                                       General ADL Comments: total (A)- pt soiled upon entry, +2 total assist for hygiene from bed level and changing gown    Extremity/Trunk Assessment Upper Extremity Assessment Upper Extremity Assessment: RUE deficits/detail;LUE deficits/detail RUE Deficits / Details: no activaiton noted no scapula movement RUE Sensation: decreased proprioception;decreased light touch RUE Coordination: decreased fine motor;decreased gross motor LUE Deficits / Details: pt noted to have have scapula activation with shrug initiated; able to initate forward flexion to pt on new gown with support to distal UE. hand activation for grasp when cued to squeeze or hold onto the ball. LUE Sensation: decreased proprioception LUE Coordination: decreased fine motor;decreased gross motor            Vision   Vision Assessment?: Vision impaired- to be further tested in functional context   Perception     Praxis     Communication Communication Communication: Impaired Factors Affecting Communication: Trach/intubated   Cognition Arousal: Alert Behavior During Therapy: Flat affect Cognition: Difficult to assess Difficult to assess due to: Impaired communication  Following commands: Impaired Following commands impaired: Follows one step commands inconsistently, Follows one step commands with increased time      Cueing   Cueing Techniques: Verbal cues, Visual cues  Exercises Exercises: Other exercises Other Exercises Other Exercises: PROM BUEs in supine    Shoulder Instructions       General Comments VSS, trach collar 6L 21% fio2    Pertinent Vitals/ Pain       Pain Assessment Pain Assessment: Faces Pain Score: 0-No pain Pain Intervention(s):  Limited activity within patient's tolerance, Monitored during session, Repositioned  Home Living                                          Prior Functioning/Environment              Frequency  Min 1X/week        Progress Toward Goals  OT Goals(current goals can now be found in the care plan section)  Progress towards OT goals: Progressing toward goals  Acute Rehab OT Goals Patient Stated Goal: none stated OT Goal Formulation: Patient unable to participate in goal setting Time For Goal Achievement: 02/06/24 Potential to Achieve Goals: Fair  Plan      Co-evaluation                 AM-PAC OT 6 Clicks Daily Activity     Outcome Measure   Help from another person eating meals?: Total Help from another person taking care of personal grooming?: Total Help from another person toileting, which includes using toliet, bedpan, or urinal?: Total Help from another person bathing (including washing, rinsing, drying)?: Total Help from another person to put on and taking off regular upper body clothing?: Total Help from another person to put on and taking off regular lower body clothing?: Total 6 Click Score: 6    End of Session Equipment Utilized During Treatment: Oxygen (via trach collar)  OT Visit Diagnosis: Other abnormalities of gait and mobility (R26.89);Muscle weakness (generalized) (M62.81);Other symptoms and signs involving the nervous system (R29.898);Other symptoms and signs involving cognitive function;Hemiplegia and hemiparesis Hemiplegia - caused by: Other Nontraumatic intracranial hemorrhage   Activity Tolerance Patient tolerated treatment well   Patient Left in bed;with call bell/phone within reach;with SCD's reapplied;with nursing/sitter in room   Nurse Communication Mobility status;Precautions        Time: 1610-9604 OT Time Calculation (min): 27 min  Charges: OT General Charges $OT Visit: 1 Visit OT Treatments $Self Care/Home  Management : 23-37 mins  Bary Boss, OT Acute Rehabilitation Services Office [phone] Secure Chat Preferred    Garrett [name] 01/30/2024, 10:32 AM

## 2024-01-30 NOTE — Plan of Care (Signed)
  Problem: Health Behavior/Discharge Planning: Goal: Goals will be collaboratively established with patient/family Outcome: Progressing   Problem: Nutrition: Goal: Risk of aspiration will decrease Outcome: Progressing   Problem: Clinical Measurements: Goal: Cardiovascular complication will be avoided Outcome: Progressing   Problem: Coping: Goal: Level of anxiety will decrease Outcome: Progressing   Problem: Elimination: Goal: Will not experience complications related to bowel motility Outcome: Progressing Goal: Will not experience complications related to urinary retention Outcome: Progressing

## 2024-01-30 NOTE — Plan of Care (Signed)
  Problem: Nutrition: Goal: Risk of aspiration will decrease Outcome: Progressing   Problem: Skin Integrity: Goal: Risk for impaired skin integrity will decrease Outcome: Progressing   Problem: Nutrition: Goal: Adequate nutrition will be maintained Outcome: Progressing   Problem: Coping: Goal: Level of anxiety will decrease Outcome: Progressing   Problem: Elimination: Goal: Will not experience complications related to bowel motility Outcome: Progressing Goal: Will not experience complications related to urinary retention Outcome: Progressing   Problem: Pain Managment: Goal: General experience of comfort will improve and/or be controlled Outcome: Progressing   Problem: Skin Integrity: Goal: Risk for impaired skin integrity will decrease Outcome: Progressing

## 2024-01-30 NOTE — Progress Notes (Signed)
 PROGRESS NOTE    Gurpreet Mikhail  ZOX:[id number] DOB: 14-Aug-1976 DOA: 10/27/2023 PCP: Pcp, No   Brief Narrative: 48 year old with no known previous medical history was found down on 10/27/2023, unresponsive with UDS positive for opioids.  He was minimally responsive to Narcan  and he was found to be markedly hypertensive-  blood pressure 262/156.  CT head notable for 4.1 cm intracranial hemorrhage at the pons with intraventricular extension into the fourth ventricle and basal cisterns.  Initially admitted by critical care, and now extubated with tracheostomy, initially placed on 3/20, replaced on 01/05/2024.  PEG tube placed 11/08/2023.   Remains with poor response.  Stable on trach and PEG.  Disposition remains difficult given patient's need for PEG and tracheostomy tube care.  Currently awaiting disability.   Significant hospital events: 3/8: Intubated in ED, pontine bleed/SAH. CT head Acute large hemorrhage 4.1 cm in pons with intraventricular extension without hydrocephalus. 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  Can follow commands with eyes. 3/16 Purposeful with left upper extremity  3/22 tracheostomy placed at bedside, bleeding issues overnight from trach site 3/24 PEG 3/24 sputum culture with MRSA 4/20: trach was changed to cuffless #8 5/1: Mental status appears to be somewhat improving off narcotics, more awake alert following simple commands(able to grimace, move left toes, track vertically with right eye).  Speech following. Trach downsized to [employer] cuffless #6 XLT 5/4: Modafinil  held, low threshold to resume if mental status/ability to interact diminishes 5/10: PEG dislodged, foley bulb placed and IR consulted to replace tube 5/11: IR replaced PEG tube 5/13 - 5/14: + Fever.  Restarted antibiotics.  Urine culture + E. Coli, foley catheter changed. 5/17 Tracheostomy changed.   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  CT head with acute large hemorrhage 4.1 cm in the pons, ventricular extension. blood pressure 260/1 50s on admission. Was admitted to the ICU and followed by Neuro.  - 2D echo ejection fraction 60%, A1c 4.7, LDL 96. - Was also treated with 3% hypertonic saline.  Follow-up CT head 3/29 with reduction in size of hemorrhage. -Patient now has trach and PEG in placed. PCCM following.  Speech therapy, PT OT is following. Needs placement.    Acute respiratory failure secondary to large pontine stroke brainstem compression Unable to protect airway/possible aspiration pneumonia - Now has trach collar.   PCCM intermittently following.  Recommended not a candidate for decannulation.  On 6/1 became tachycardic, tachypneic and febrile.  Chest x-ray unremarkable and procalcitonin was negative.  Initially started on vancomycin  and cefepime  later transitioned to vancomycin  as sputum cultures grew MRSA.  -He completed 7 days of vancomycin  on 01/26/2024   Hypertension: Continue Norvasc , losartan , Lopressor    Transaminases: Improved.  CT abdomen pelvis negative, acute hepatitis panel negative. Check LFT tomorrow.    E. coli UTI  Completed antibiotics course 5/18 Foley catheter was changed 5/13.  Should change it again on 01/31/24   Moderate to severe protein caloric malnutrition, POA Peg in place, TF per RD.  PEG tube malfunction, resolved PEG dislodged 5/10, replaced on 5/11 by  IR   Exposure keratopathy, OS Seen by ophthalmology, Dr. Mason Sole on 4/7.  Treated with maxitrol ointment (neo-poly-dex) TID OS for 1 week. -- Refresh PM TID to QID OS for chronic lubrication   AKI with acute urinary retention Resolved.  Has Foley in placed   Macrocytic anemia thrombocytosis  stable/resolved   Perineal skin injury, POA  Continue Gerhard's Butt cream   Relative B12 and folate deficiency Continue replacement with MVI    Goal of care: Currently DNR with intervention.   Nutrition Problem: Inadequate oral intake Etiology: inability to eat   Interventions: Tube feeding, Prostat, MVI, Juven     Nutrition Problem: Inadequate oral intake Etiology: inability to eat    Signs/Symptoms: NPO status    Interventions: Tube feeding, Prostat, MVI, Juven  Estimated body mass index is 30.5 kg/m as calculated from the following:   Height as of this encounter: 5' 8 (1.727 m).   Weight as of this encounter: 91 kg.   DVT prophylaxis: Lovenox  Code Status: DNR Family Communication: no family at bedside Disposition Plan:  Status is: Inpatient Remains inpatient appropriate because: awaiting placement    Consultants:  Neurology CCM Ophthalmology      Subjective: Awake, does not follows command.   Objective: Vitals:   01/29/24 2358 01/30/24 0245 01/30/24 0449 01/30/24 0816  BP: 134/89  (!) 145/100 (!) 142/98  Pulse: 85 84 87 93  Resp: 19 18 18 18   Temp: 98 F (36.7 C)  98 F (36.7 C) 98.2 F (36.8 C)  TempSrc: Oral  Oral Axillary  SpO2: 98% 100% 97% 100%  Weight:      Height:        Intake/Output Summary (Last 24 hours) at 01/30/2024 0835 Last data filed at 01/30/2024 0614 Gross per 24 hour  Intake 637 ml  Output 1450 ml  Net -813 ml   Filed Weights   01/26/24 0500 01/27/24 0500 01/28/24 0500  Weight: 91 kg 91 kg 91 kg    Examination:  General exam: Appears calm and comfortable  Respiratory system: BL ronchus, trach in  placed Cardiovascular system: S1 & S2 heard, RRR. Gastrointestinal system: Abdomen is nondistended, soft and nontender. No organomegaly or masses felt. Normal bowel sounds heard. Central nervous system: awake, does not follows command Extremities: no edema   Data Reviewed: I have personally reviewed following labs and imaging studies  CBC: Recent Labs  Lab 01/24/24 1026 01/25/24 0930 01/28/24 0739  WBC 6.2 8.2 7.8  NEUTROABS 3.8  --   --   HGB 12.1* 11.5* 12.5*  HCT 39.0 36.3* 40.1  MCV 92.4 91.0 92.4  PLT 552* 546* 586*   Basic Metabolic Panel: Recent Labs  Lab 01/24/24 1026 01/25/24 0930 01/28/24 0739  NA 138 137 138  K 3.9 3.6 4.2  CL 101 102 103  CO2 24 25 25   GLUCOSE 108* 116* 97  BUN 20 19 23*  CREATININE 0.64 0.41* 0.55*  CALCIUM 9.6 9.2 9.6  MG  --  2.0 2.1   GFR: Estimated Creatinine Clearance: 125 mL/min (A) (by C-G formula based on SCr of 0.55 mg/dL (L)). Liver Function Tests: No results for input(s): AST, ALT, ALKPHOS, BILITOT, PROT, ALBUMIN  in the last 168 hours. No results for input(s): LIPASE, AMYLASE in the last 168 hours. No results for input(s): AMMONIA in the last 168 hours. Coagulation Profile: No results for input(s): INR, PROTIME in the last 168 hours. Cardiac Enzymes: No results  for input(s): CKTOTAL, CKMB, CKMBINDEX, TROPONINI in the last 168 hours. BNP (last 3 results) No results for input(s): PROBNP in the last 8760 hours. HbA1C: No results for input(s): HGBA1C in the last 72 hours. CBG: Recent Labs  Lab 01/29/24 1312 01/29/24 1623 01/29/24 2021 01/29/24 2355 01/30/24 0447  GLUCAP 154* 119* 169* 132* 110*   Lipid Profile: No results for input(s): CHOL, HDL, LDLCALC, TRIG, CHOLHDL, LDLDIRECT in the last 72 hours. Thyroid Function Tests: No results for input(s): TSH, T4TOTAL, FREET4, T3FREE, THYROIDAB in the last 72 hours. Anemia Panel: No results for input(s):  VITAMINB12, FOLATE, FERRITIN, TIBC, IRON, RETICCTPCT in the last 72 hours. Sepsis Labs: No results for input(s): PROCALCITON, LATICACIDVEN in the last 168 hours.  Recent Results (from the past 240 hours)  Culture, blood (Routine X 2) w Reflex to ID Panel     Status: None   Collection Time: 01/20/24  8:32 PM   Specimen: BLOOD  Result Value Ref Range Status   Specimen Description BLOOD SITE NOT SPECIFIED  Final   Special Requests   Final    BOTTLES DRAWN AEROBIC AND ANAEROBIC Blood Culture results may not be optimal due to an inadequate volume of blood received in culture bottles   Culture   Final    NO GROWTH 5 DAYS Performed at [facility], [address] [place], [name] [address]    Report Status 01/25/2024 FINAL  Final  Culture, blood (Routine X 2) w Reflex to ID Panel     Status: None   Collection Time: 01/20/24  8:32 PM   Specimen: BLOOD  Result Value Ref Range Status   Specimen Description BLOOD SITE NOT SPECIFIED  Final   Special Requests   Final    BOTTLES DRAWN AEROBIC AND ANAEROBIC Blood Culture results may not be optimal due to an inadequate volume of blood received in culture bottles   Culture   Final    NO GROWTH 5 DAYS Performed at [facility], [address], [place], [name] [address]    Report Status 01/25/2024 FINAL  Final  Respiratory (~20 pathogens) panel by PCR     Status: None   Collection Time: 01/20/24  8:40 PM   Specimen: Nasopharyngeal Swab; Respiratory  Result Value Ref Range Status   Adenovirus NOT DETECTED NOT DETECTED Final   Coronavirus 229E NOT DETECTED NOT DETECTED Final    Comment: (NOTE) The Coronavirus on the Respiratory Panel, DOES NOT test for the novel  Coronavirus (2019 nCoV)    Coronavirus HKU1 NOT DETECTED NOT DETECTED Final   Coronavirus NL63 NOT DETECTED NOT DETECTED Final   Coronavirus OC43 NOT DETECTED NOT DETECTED Final   Metapneumovirus NOT DETECTED NOT DETECTED Final   Rhinovirus / Enterovirus  NOT DETECTED NOT DETECTED Final   Influenza A NOT DETECTED NOT DETECTED Final   Influenza B NOT DETECTED NOT DETECTED Final   Parainfluenza Virus 1 NOT DETECTED NOT DETECTED Final   Parainfluenza Virus 2 NOT DETECTED NOT DETECTED Final   Parainfluenza Virus 3 NOT DETECTED NOT DETECTED Final   Parainfluenza Virus 4 NOT DETECTED NOT DETECTED Final   Respiratory Syncytial Virus NOT DETECTED NOT DETECTED Final   Bordetella pertussis NOT DETECTED NOT DETECTED Final   Bordetella Parapertussis NOT DETECTED NOT DETECTED Final   Chlamydophila pneumoniae NOT DETECTED NOT DETECTED Final   Mycoplasma pneumoniae NOT DETECTED NOT DETECTED Final    Comment: Performed at [facility], [address], [place], [name] [address]  Resp  panel by RT-PCR (RSV, Flu A&B, Covid) Anterior Nasal Swab     Status: None   Collection Time: 01/20/24  8:40 PM   Specimen: Anterior Nasal Swab  Result Value Ref Range Status   SARS Coronavirus 2 by RT PCR NEGATIVE NEGATIVE Final   Influenza A by PCR NEGATIVE NEGATIVE Final   Influenza B by PCR NEGATIVE NEGATIVE Final    Comment: (NOTE) The Xpert Xpress SARS-CoV-2/FLU/RSV plus assay is intended as an aid in the diagnosis of influenza from Nasopharyngeal swab specimens and should not be used as a sole basis for treatment. Nasal washings and aspirates are unacceptable for Xpert Xpress SARS-CoV-2/FLU/RSV testing.  Fact Sheet for Patients: BloggerCourse.com  Fact Sheet for Healthcare Providers: SeriousBroker.it  This test is not yet approved or cleared by the [place]  FDA and has been authorized for detection and/or diagnosis of SARS-CoV-2 by FDA under an Emergency Use Authorization (EUA). This EUA will remain in effect (meaning this test can be used) for the duration of the COVID-19 declaration under Section 564(b)(1) of the Act, 21 U.S.C. section 360bbb-3(b)(1), unless the authorization is terminated  or revoked.     Resp Syncytial Virus by PCR NEGATIVE NEGATIVE Final    Comment: (NOTE) Fact Sheet for Patients: BloggerCourse.com  Fact Sheet for Healthcare Providers: SeriousBroker.it  This test is not yet approved or cleared by the [place]  FDA and has been authorized for detection and/or diagnosis of SARS-CoV-2 by FDA under an Emergency Use Authorization (EUA). This EUA will remain in effect (meaning this test can be used) for the duration of the COVID-19 declaration under Section 564(b)(1) of the Act, 21 U.S.C. section 360bbb-3(b)(1), unless the authorization is terminated or revoked.  Performed at [facility], [address] [place], [name] [address]   Culture, Respiratory w Gram Stain     Status: None   Collection Time: 01/21/24  5:55 AM   Specimen: Tracheal Aspirate  Result Value Ref Range Status   Specimen Description TRACHEAL ASPIRATE  Final   Special Requests NONE  Final   Gram Stain   Final    FEW WBC PRESENT, PREDOMINANTLY PMN FEW GRAM NEGATIVE RODS FEW GRAM POSITIVE COCCI RARE GRAM POSITIVE RODS Performed at [facility], [address] [place], [name] [address]    Culture   Final    MODERATE METHICILLIN RESISTANT STAPHYLOCOCCUS AUREUS   Report Status 01/23/2024 FINAL  Final   Organism ID, Bacteria METHICILLIN RESISTANT STAPHYLOCOCCUS AUREUS  Final      Susceptibility   Methicillin resistant staphylococcus aureus - MIC*    CIPROFLOXACIN <=0.5 SENSITIVE Sensitive     ERYTHROMYCIN  >=8 RESISTANT Resistant     GENTAMICIN <=0.5 SENSITIVE Sensitive     OXACILLIN >=4 RESISTANT Resistant     TETRACYCLINE >=16 RESISTANT Resistant     VANCOMYCIN  <=0.5 SENSITIVE Sensitive     TRIMETH/SULFA <=10 SENSITIVE Sensitive     CLINDAMYCIN <=0.25 SENSITIVE Sensitive     RIFAMPIN <=0.5 SENSITIVE Sensitive     Inducible Clindamycin NEGATIVE Sensitive     LINEZOLID  2 SENSITIVE Sensitive     * MODERATE  METHICILLIN RESISTANT STAPHYLOCOCCUS AUREUS         Radiology Studies: No results found.      Scheduled Meds:  amLODipine   10 mg Per Tube Daily   artificial tears   Left Eye Q8H   Chlorhexidine  Gluconate Cloth  6 each Topical Daily   enoxaparin  (LOVENOX ) injection  40 mg Subcutaneous Daily   famotidine   20  mg Per Tube BID   feeding supplement (OSMOLITE 1.5 CAL)  237 mL Per Tube 5 X Daily   feeding supplement (PROSource TF20)  60 mL Per Tube TID   free water   150 mL Per Tube Q4H   insulin  aspart  0-15 Units Subcutaneous 5 times per day   losartan   100 mg Per Tube Daily   metoprolol  tartrate  100 mg Per Tube BID   multivitamin with minerals  1 tablet Per Tube Daily   mouth rinse  15 mL Mouth Rinse 4 times per day   Continuous Infusions:   LOS: 95 days    Time spent: 35 minutes    Ahijah Devery A Jenaye Rickert, MD [facility]   If 7PM-7AM, please contact night-coverage [email or web address]  01/30/2024, 8:35 AM

## 2024-01-30 NOTE — Progress Notes (Signed)
 01/30/2024   Patient Lines/Drains/Airways Status     Active Line/Drains/Airways     Name Placement date Placement time Site Days   Peripheral IV 01/26/24 20 G 1.88 Left;Upper Arm 01/26/24  1849  Arm  4   Gastrostomy/Enterostomy Percutaneous endoscopic gastrostomy (PEG) LUQ 01/11/24  1900  LUQ  19   Urethral Catheter Marcelle Sergeant BSN RN Latex 16 Fr. 01/01/24  1140  Latex  29   Tracheostomy Shiley Flexible 4 mm Uncuffed 01/28/24  1248  4 mm  2   Wound / Incision (Open or Dehisced) 11/21/23 Non-pressure wound Eye Left Edematous, conjuctiva red, moderate drainage, iris appears to be opaque 11/21/23  0700  Eye  70             Appears nonlabored breathing at present Secretions and cough strength still make me hesitate for capping trial Will re-eval 6/16 and if does okay through weekend can consider cap next week      Ardelle Kos MD [facility] Prefer epic messenger for cross cover needs

## 2024-01-30 NOTE — Progress Notes (Signed)
 I was called by RTT Garrett [name]) that she could not pass the suction cath through the trach #4 which was inserted 01/28/2024 after #6 XLT. CO2 tidal was negative, he continued to have good SpO2 @ 28% Trach collar. Bedside bronchoscopy: The distal end of the trach is hitting tracheal wall with ulceration, edema and mucosa thickening. Attempts to change it back to #6 XLT failed due to tight track. Attempts to reinsert same trach (#4) failed also to due anatomy, barely allowing bronch to pass down to trachea which looks grossly normal, but no detailed airway exam was attempted. The patient was de cannulated RN and RTT at bedside all time  Garrett Schilder, MD [facility] Pager: see AMION

## 2024-01-30 NOTE — TOC Progression Note (Signed)
 Transition of Care [facility]) - Progression Note    Patient Details  Name: Garrett [name] MRN: [id number] Date of Birth: 18-Dec-1975  Transition of Care [facility]) CM/SW Contact  Tandy Fam, [name] Phone Number: 01/30/2024, 3:37 PM  Clinical Narrative:   CSW met with [facility] Director to discuss patient's care needs for possible LOG placement. Patient not a candidate at this time. CSW to follow.    Expected Discharge Plan: Long Term Nursing Home Barriers to Discharge: Continued Medical Work up, SNF Pending Medicaid, SNF Pending bed offer, SNF Pending payor source - LOG, Inadequate or no insurance (New trach)  Expected Discharge Plan and Services In-house Referral: Clinical Social Work, Hospice / Palliative Care   Post Acute Care Choice: Skilled Nursing Facility Living arrangements for the past 2 months: Single Family Home                                       Social Determinants of Health (SDOH) Interventions SDOH Screenings   Food Insecurity: Patient Unable To Answer (10/29/2023)  Social Connections: Unknown (06/23/2023)   Received from [facility]  Tobacco Use: High Risk (10/31/2023)    Readmission Risk Interventions     No data to display

## 2024-01-30 NOTE — Progress Notes (Signed)
 Nutrition Follow-up  DOCUMENTATION CODES:  Not applicable  INTERVENTION:  Continue tube feeding via PEG 5 cartons of Osmolite 1.5 per day Prosource TF20 60 ml TID free water  6x/d Provides 2015 kcal, 134 gm protein, 900 ml free water  daily ( free water , TF + flush) MVI with minerals daily Banatrol BID to aid in adding bulk to stools  NUTRITION DIAGNOSIS:  Inadequate oral intake related to inability to eat as evidenced by NPO status. 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 UDS positive for opioids. Noted aspiration PNA on admission.   3/08 - admitted with [facility], intubated 3/14 - s/p cortrak placement (tip gastric) 3/22 - s/p tracheostomy 3/24 - s/p PEG 4/20 - Cuff changed to cuffless 5/10 - PEG dislodged, TF's stopped  5/11 - PEG replaced, TF's restarted at goal   Pt resting in bed at the time of assessment. No significant changes and pt is medically stable for discharge once placement is obtained. TOC working on [employer] bolus feeding regimen well. Discussed with RN, using pump to administer boluses. Ok for closed system formula to hang x 24 hours after spiked.  Admit weight: 103 kg  Current weight: 91 kg   Nutritionally Relevant Medications: Scheduled Meds:  famotidine   20 mg Per Tube BID   OSMOLITE 1.5 CAL  237 mL Per Tube 5 X Daily   PROSource TF20  60 mL Per Tube TID   free water   150 mL Per Tube Q4H   insulin  aspart  0-15 Units Subcutaneous 5 times per day   multivitamin with minerals  1 tablet Per Tube Daily   PRN Meds: bisacodyl , polyethylene glycol, prochlorperazine   Labs Reviewed: CBG  ranges from 98-169 mg/dL over the last 24 hours HgbA1c 4.7%  Diet Order:   Diet Order             Diet NPO time specified  Diet effective now                   EDUCATION NEEDS:  No education needs have been identified at this time  Skin: Reviewed RN skin assessment  Last BM:  6/11 - type 6  Height:  Ht Readings from Last 1 Encounters:  11/22/23 5' 8 (1.727 m)    Weight:  Wt Readings from Last 1 Encounters:  01/28/24 91 kg    Ideal Body Weight:  70 kg  BMI:  Body mass index is 30.5 kg/m.  Estimated Nutritional Needs:  Kcal:  2000-2200 Protein:  125-140 grams Fluid:  >2 L/day    Edwena Graham, RD, LDN Registered Dietitian II Please reach out via secure chat

## 2024-01-31 ENCOUNTER — Inpatient Hospital Stay (HOSPITAL_COMMUNITY)

## 2024-01-31 DIAGNOSIS — Z430 Encounter for attention to tracheostomy: Secondary | ICD-10-CM | POA: Diagnosis not present

## 2024-01-31 DIAGNOSIS — I619 Nontraumatic intracerebral hemorrhage, unspecified: Secondary | ICD-10-CM | POA: Diagnosis not present

## 2024-01-31 LAB — HEPATIC FUNCTION PANEL
ALT: 210 U/L — ABNORMAL HIGH (ref 0–44)
AST: 58 U/L — ABNORMAL HIGH (ref 15–41)
Albumin: 3 g/dL — ABNORMAL LOW (ref 3.5–5.0)
Alkaline Phosphatase: 62 U/L (ref 38–126)
Bilirubin, Direct: <0.1 mg/dL (ref 0.0–0.2)
Total Bilirubin: 0.4 mg/dL (ref 0.0–1.2)
Total Protein: 7.6 g/dL (ref 6.5–8.1)

## 2024-01-31 LAB — GLUCOSE, CAPILLARY
Glucose-Capillary: 123 mg/dL — ABNORMAL HIGH (ref 70–99)
Glucose-Capillary: 131 mg/dL — ABNORMAL HIGH (ref 70–99)
Glucose-Capillary: 168 mg/dL — ABNORMAL HIGH (ref 70–99)
Glucose-Capillary: 123 mg/dL — ABNORMAL HIGH (ref 70–99)
Glucose-Capillary: 110 mg/dL — ABNORMAL HIGH (ref 70–99)

## 2024-01-31 LAB — CBC
HCT: 39.7 % (ref 39.0–52.0)
Hemoglobin: 12 g/dL — ABNORMAL LOW (ref 13.0–17.0)
MCH: 27.8 pg (ref 26.0–34.0)
MCHC: 30.2 g/dL (ref 30.0–36.0)
MCV: 92.1 fL (ref 80.0–100.0)
Platelets: 596 10*3/uL — ABNORMAL HIGH (ref 150–400)
RBC: 4.31 MIL/uL (ref 4.22–5.81)
RDW: 14.9 % (ref 11.5–15.5)
WBC: 12.6 10*3/uL — ABNORMAL HIGH (ref 4.0–10.5)
nRBC: 0 % (ref 0.0–0.2)

## 2024-01-31 LAB — BASIC METABOLIC PANEL WITH GFR
Anion gap: 11 (ref 5–15)
BUN: 21 mg/dL — ABNORMAL HIGH (ref 6–20)
CO2: 28 mmol/L (ref 22–32)
Calcium: 9.5 mg/dL (ref 8.9–10.3)
Chloride: 100 mmol/L (ref 98–111)
Creatinine, Ser: 0.64 mg/dL (ref 0.61–1.24)
GFR, Estimated: >60 mL/min (ref >60)
Glucose, Bld: 159 mg/dL — ABNORMAL HIGH (ref 70–99)
Potassium: 4.1 mmol/L (ref 3.5–5.1)
Sodium: 139 mmol/L (ref 135–145)

## 2024-01-31 LAB — MAGNESIUM: Magnesium: 2 mg/dL (ref 1.7–2.4)

## 2024-01-31 NOTE — Plan of Care (Signed)
  Problem: Intracerebral Hemorrhage Tissue Perfusion: Goal: Complications of Intracerebral Hemorrhage will be minimized Outcome: Progressing   Problem: Coping: Goal: Will verbalize positive feelings about self Outcome: Progressing Goal: Will identify appropriate support needs Outcome: Progressing

## 2024-01-31 NOTE — Progress Notes (Signed)
 Changed foley cath per order. See LDA.

## 2024-01-31 NOTE — Progress Notes (Signed)
 PROGRESS NOTE    Garrett [name]  ZOX:[id number] DOB: 25-Jan-1976 DOA: 10/27/2023 PCP: Pcp, No   Brief Narrative: 48 year old with no known previous medical history was found down on 10/27/2023, unresponsive with UDS positive for opioids.  He was minimally responsive to Narcan  and he was found to be markedly hypertensive-  blood pressure 262/156.  CT head notable for 4.1 cm intracranial hemorrhage at the pons with intraventricular extension into the fourth ventricle and basal cisterns.  Initially admitted by critical care, and now extubated with tracheostomy, initially placed on 3/20, replaced on 01/05/2024.  PEG tube placed 11/08/2023.   Remains with poor response.  Stable on trach and PEG.  Disposition remains difficult given patient's need for PEG and tracheostomy tube care.  Currently awaiting disability.   Significant hospital events: 3/8: Intubated in ED, pontine bleed/SAH. CT head Acute large hemorrhage 4.1 cm in pons with intraventricular extension without hydrocephalus. 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  Can follow commands with eyes. 3/16 Purposeful with left upper extremity  3/22 tracheostomy placed at bedside, bleeding issues overnight from trach site 3/24 PEG 3/24 sputum culture with MRSA 4/20: trach was changed to cuffless #8 5/1: Mental status appears to be somewhat improving off narcotics, more awake alert following simple commands(able to grimace, move left toes, track vertically with right eye).  Speech following. Trach downsized to [employer] cuffless #6 XLT 5/4: Modafinil  held, low threshold to resume if mental status/ability to interact diminishes 5/10: PEG dislodged, foley bulb placed and IR consulted to replace tube 5/11: IR replaced PEG tube 5/13 - 5/14: + Fever.  Restarted antibiotics.  Urine culture + E. Coli, foley catheter changed. 5/17 Tracheostomy changed.   5/18: Completed antibiotics for  UTI 5/23: Stable, no significant change, awaiting disability for placement. 6-11 Tracheostomy de cannulated 01/29/2024 after RT couldn't pass suction catheter.    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  CT head with acute large hemorrhage 4.1 cm in the pons, ventricular extension. blood pressure 260/1 50s on admission. Was admitted to the ICU and followed by Neuro.  - 2D echo ejection fraction 60%, A1c 4.7, LDL 96. - Was also treated with 3% hypertonic saline.  Follow-up CT head 3/29 with reduction in size of hemorrhage. -Patient now has trach and PEG in placed. PCCM following.  Speech therapy, PT OT is following. Needs placement.    Acute respiratory failure secondary to large pontine stroke brainstem compression Unable to protect airway/possible aspiration pneumonia - Now has trach collar.   PCCM intermittently following.  Recommended not a candidate for decannulation.  On 6/1 became tachycardic, tachypneic and febrile.  Chest x-ray unremarkable and procalcitonin was negative.  Initially started on vancomycin  and cefepime  later transitioned to vancomycin  as sputum cultures grew MRSA.  -He completed 7 days of vancomycin  on 01/26/2024  -De cannulated 6/11 after RT could pass suction catheter. CCM following, plan to monitor for now, may need tracheostomy in placed in future OT per CCM Bedside fiberoptic bronchoscopy: Revealed ulcerations Celano, tracheostomy distal multiples attempt made to replaced trach.  Hypertension: Continue Norvasc , losartan ,  Lopressor    Transaminases: Improved.  CT abdomen pelvis negative, acute hepatitis panel negative. LFT: stable. AST 58, ALT 210   E. coli UTI  Completed antibiotics course 5/18 Foley catheter was changed 5/13.  Plan to change it again on 01/31/24   Moderate to severe protein caloric malnutrition, POA Peg in place, TF per RD.    PEG tube malfunction, resolved PEG dislodged 5/10, replaced on 5/11 by IR   Exposure keratopathy, OS Seen by ophthalmology, Dr. Mason Sole on 4/7.  Treated with maxitrol ointment (neo-poly-dex) TID OS for 1 week. -- Refresh PM TID to QID OS for chronic lubrication   AKI with acute urinary retention Resolved.  Has Foley in placed   Macrocytic anemia thrombocytosis  stable/resolved   Perineal skin injury, POA  Continue Gerhard's Butt cream   Relative B12 and folate deficiency Continue replacement with MVI    Goal of care: Currently DNR with intervention.   Nutrition Problem: Inadequate oral intake Etiology: inability to eat   Interventions: Tube feeding, Prostat, MVI, Juven     Nutrition Problem: Inadequate oral intake Etiology: inability to eat    Signs/Symptoms: NPO status    Interventions: Tube feeding, Prostat, MVI, Juven  Estimated body mass index is 30.17 kg/m as calculated from the following:   Height as of this encounter: 5' 8 (1.727 m).   Weight as of this encounter: 90 kg.   DVT prophylaxis: Lovenox  Code Status: DNR Family Communication: Significant other at bedside.  Disposition Plan:  Status is: Inpatient Remains inpatient appropriate because: awaiting placement    Consultants:  Neurology CCM Ophthalmology      Subjective: He is alert, grip with right hand when ask.  Events from last night noticed.  Objective: Vitals:   01/31/24 0751 01/31/24 0818 01/31/24 1142 01/31/24 1156  BP: (!) 134/97   (!) 137/98  Pulse: 98 93 96 83  Resp: 19 (!) 21 20 19   Temp: 98.9 F (37.2 C)   99.2 F (37.3 C)  TempSrc: Oral    Oral  SpO2: 99% 96% 96% 98%  Weight:      Height:        Intake/Output Summary (Last 24 hours) at 01/31/2024 1342 Last data filed at 01/31/2024 0600 Gross per 24 hour  Intake 1224 ml  Output 1600 ml  Net -376 ml   Filed Weights   01/27/24 0500 01/28/24 0500 01/31/24 0459  Weight: 91 kg 91 kg 90 kg    Examination:  General exam: NAD Respiratory system: BL ronchus Cardiovascular system: S 1, S 2 RRR Gastrointestinal system: BS present, soft, nt Central nervous system: awake, does not follows command Extremities: no edema   Data Reviewed: I have personally reviewed following labs and imaging studies  CBC: Recent Labs  Lab 01/25/24 0930 01/28/24 0739 01/31/24 0806  WBC 8.2 7.8 12.6*  HGB 11.5* 12.5* 12.0*  HCT 36.3* 40.1 39.7  MCV 91.0 92.4 92.1  PLT 546* 586* 596*   Basic Metabolic Panel: Recent Labs  Lab 01/25/24 0930 01/28/24 0739 01/31/24 0806  NA 137 138 139  K 3.6 4.2 4.1  CL 102 103 100  CO2 25 25 28   GLUCOSE 116* 97 159*  BUN 19 23* 21*  CREATININE 0.41* 0.55* 0.64  CALCIUM 9.2 9.6 9.5  MG 2.0 2.1 2.0   GFR: Estimated Creatinine Clearance: 124.3 mL/min (by C-G formula based on SCr of 0.64 mg/dL). Liver Function Tests: Recent Labs  Lab 01/31/24 0806  AST 58*  ALT  210*  ALKPHOS 62  BILITOT 0.4  PROT 7.6  ALBUMIN  3.0*   No results for input(s): LIPASE, AMYLASE in the last 168 hours. No results for input(s): AMMONIA in the last 168 hours. Coagulation Profile: No results for input(s): INR, PROTIME in the last 168 hours. Cardiac Enzymes: No results for input(s): CKTOTAL, CKMB, CKMBINDEX, TROPONINI in the last 168 hours. BNP (last 3 results) No results for input(s): PROBNP in the last 8760 hours. HbA1C: No results for input(s): HGBA1C in the last 72 hours. CBG: Recent Labs  Lab 01/30/24 1412 01/30/24 1853 01/30/24 2204 01/31/24 0335 01/31/24 1157  GLUCAP 98 129* 140* 123* 131*   Lipid Profile: No results for  input(s): CHOL, HDL, LDLCALC, TRIG, CHOLHDL, LDLDIRECT in the last 72 hours. Thyroid Function Tests: No results for input(s): TSH, T4TOTAL, FREET4, T3FREE, THYROIDAB in the last 72 hours. Anemia Panel: No results for input(s): VITAMINB12, FOLATE, FERRITIN, TIBC, IRON, RETICCTPCT in the last 72 hours. Sepsis Labs: No results for input(s): PROCALCITON, LATICACIDVEN in the last 168 hours.  No results found for this or any previous visit (from the past 240 hours).        Radiology Studies: No results found.      Scheduled Meds:  amLODipine   10 mg Per Tube Daily   artificial tears   Left Eye Q8H   Chlorhexidine  Gluconate Cloth  6 each Topical Daily   enoxaparin  (LOVENOX ) injection  40 mg Subcutaneous Daily   famotidine   20 mg Per Tube BID   feeding supplement (OSMOLITE 1.5 CAL)  237 mL Per Tube 5 X Daily   feeding supplement (PROSource TF20)  60 mL Per Tube TID   fiber supplement (BANATROL TF)  60 mL Per Tube BID   free water   150 mL Per Tube Q4H   insulin  aspart  0-15 Units Subcutaneous 5 times per day   losartan   100 mg Per Tube Daily   metoprolol  tartrate  100 mg Per Tube BID   multivitamin with minerals  1 tablet Per Tube Daily   mouth rinse  15 mL Mouth Rinse 4 times per day   Continuous Infusions:   LOS: 96 days    Time spent: 35 minutes    Joeph Szatkowski A Constance Whittle, MD [facility]   If 7PM-7AM, please contact night-coverage [email or web address]  01/31/2024, 1:42 PM

## 2024-01-31 NOTE — Progress Notes (Signed)
 Speech Language Pathology Treatment: Cognitive-Linguistic  Patient Details Name: Garrett [name] MRN: [id number] DOB: Jan 21, 1976 Today's Date: 01/31/2024 Time: 3086-5784 SLP Time Calculation (min) (ACUTE ONLY): 14 min  Assessment / Plan / Recommendation Clinical Impression  Session today focused on communication, with PMV no longer indicated as he has been decannulated since last SLP visit. When asked to phonate, he did take a deeper inhalation with audible exhalation, but this could not be shaped into phonation yet. Attempted with hard and easy onsets, modeling, humming. He did not move his mouth or articulators to command but did squeeze SLP's hand and move his thumb on his LUE (other fingers not moved to command to attempt pointing). SLP could not locate his Yes/No eye gaze board (will need to look further or make a new one for him), but he did still use eye gaze by looking up for yes and down for no. He responded consistently to eyes/no questions about preferences/interests and show he was currently watching. Will continue to follow and try for additional co-txs to optimize function.    HPI HPI: Pt is a 48 yo male who was found down 10/27/23 with agonal breathing. 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 Intubated 3/8, cortrak placed 3/14, trach placed 3/22, PEG placed 3/24. PMH: HTN, smoker      SLP Plan  Continue with current plan of care          Recommendations                     Oral care QID   Frequent or constant Supervision/Assistance Cognitive communication deficit (O96.295)     Continue with current plan of care     Beth Brooke., M.A. CCC-SLP Acute Rehabilitation Services Office: [phone]  Secure chat preferred   01/31/2024, 11:56 AM

## 2024-01-31 NOTE — Progress Notes (Signed)
   01/31/24 0015  Provider Notification  Provider Name/Title Dr. [facility]  Date Provider Notified 01/31/24  Time Provider Notified 0016  Method of Notification Page  Notification Reason Change in status  We have some concern that after the trach decannulation, Pt has  labored breathing, rhonchi with gargling sound in his throat, RR 30 with accessory muscle used. His SPO2 87%, HR 87, BP stable. Family member is very anxious at bedside.  Provider response MD evaluates at bedside;See new order for stat CXR>> after oralpharyngeal suction by RT Camilo Cella and gets some moderate amount of red blood, Pt breathing has become normal, no more labored breathing, MD cancels stat CxR at this moment.  We will continue to monitor closely.)  Date of Provider Response 01/31/24  Time of Provider Response 0016   Brain Cahill, RN

## 2024-01-31 NOTE — Plan of Care (Signed)
  Problem: Education: Goal: Knowledge of disease or condition will improve Outcome: Progressing Goal: Knowledge of secondary prevention will improve (MUST DOCUMENT ALL) Outcome: Progressing Goal: Knowledge of patient specific risk factors will improve (DELETE if not current risk factor) Outcome: Progressing   Problem: Intracerebral Hemorrhage Tissue Perfusion: Goal: Complications of Intracerebral Hemorrhage will be minimized 01/31/2024 1831 by Apolinar Baxter, RN Outcome: Progressing 01/31/2024 1654 by Apolinar Baxter, RN Outcome: Progressing   Problem: Coping: Goal: Will verbalize positive feelings about self 01/31/2024 1831 by Apolinar Baxter, RN Outcome: Progressing 01/31/2024 1654 by Apolinar Baxter, RN Outcome: Progressing Goal: Will identify appropriate support needs 01/31/2024 1831 by Apolinar Baxter, RN Outcome: Progressing 01/31/2024 1654 by Apolinar Baxter, RN Outcome: Progressing   Problem: Health Behavior/Discharge Planning: Goal: Ability to manage health-related needs will improve Outcome: Progressing Goal: Goals will be collaboratively established with patient/family Outcome: Progressing   Problem: Self-Care: Goal: Ability to participate in self-care as condition permits will improve Outcome: Progressing Goal: Verbalization of feelings and concerns over difficulty with self-care will improve Outcome: Progressing Goal: Ability to communicate needs accurately will improve Outcome: Progressing   Problem: Nutrition: Goal: Risk of aspiration will decrease Outcome: Progressing Goal: Dietary intake will improve Outcome: Progressing   Problem: Education: Goal: Ability to describe self-care measures that may prevent or decrease complications (Diabetes Survival Skills Education) will improve Outcome: Progressing

## 2024-01-31 NOTE — Progress Notes (Signed)
 NAME:  Garrett [name], MRN:  [id number], DOB:  [birth year], LOS: 96 ADMISSION DATE:  10/27/2023, CONSULTATION DATE:  10/27/2023 REFERRING MD:  Garrett Hurdle, DO CHIEF COMPLAINT:  Found Down  History of Present Illness:  48 y/o male with past medical history of hypertension who was found down for unknown downtime by his fiance when she came home from work. Reportedly, having agonal breathing.  He apparently had driven her to work this morning.  He has HTN but never checks his BP and does not follow with MD.  She says you can tell when his BP is really high; his eyes get blood shot and he is sweating.  No h/o seizures.  Besides almost daily Garrett [name] and cigarette smoking, she is not aware of any other drugs. Drug screen positive for opioids and he was given several doses of Narcan . Patient found to have:  1. 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 Basal cisterns and fourth ventricle are effaced without hydrocephalus at this time. 2. Trace additional subarachnoid hemorrhage along the left parietal convexity. BP in ED 262/156. His CXR revealed a RUL and perihilar infiltrates suggesting aspiration pneumonia. ED labs also revealed PH 7.169, LA 4.4, CPK 985. Patient was intubated in ED.  Pertinent  Medical History  hypertension  Significant Hospital Events: Including procedures, antibiotic start and stop dates in addition to other pertinent events   3/8: Intubated in ED, pontine bleed/SAH. CT head 17:09 Acute large hemorrhage 4.1 cm in pons with intraventricular extension without hydrocephalus 3/9: CTA 03:14 AM No change in IPH in pons, similar biparietal [facility] 3/14: Now awake, appears locked in can follow commands with eyes. 3/16 Purposeful with left upper extremity  3/22 tracheostomy placed at bedside, bleeding issues overnight from trach site 3/24 PEG (Dr. Hildy Lowers) 3/24  sputum culture with MRSA 3/27 vomited. TF on hold. Abd film c/w ileus. Added reglan , got SSE. Did tolerate PSV all day  3/28 BMs x2 after SSE day prior. Added back TFs at 1/2 rated. Tolerated PS 3/29 Tolerated PS  3/31 Tolerating CPAP PS 15/5, TF on hold due to ileus. 4/1: con't to hold tube feeds today, restarting vancomycin  and sending tracheal aspirate as peaks are 37, plat 24, driving 19. Thick secretions. Fever overnight.  4/2: peaks improved. Ongoing hiccups. Trickle feeding. Neuro exam unchanged.  4/20: trach was changed to cuffless #8 4/28: not safe to swallow. Limited communication and he follows simple commands. On TF 5/1: on TC 28%, with large secretions. Working with speech and he is improving to initiate PMV trials under ST supervision   5/5: on TC 30%, 7 L/min. Trach #6 cuffless, changed 5/1 to facilitate PMV trials  5/12: on TC 21%, 6 L/min. Trach #6 cuffless, unable to produce sounds or speak on PMV. No significant resp secretions. On PEG TF 01/29/2024 tracheostomy was removed due to failure to well provide adequate airway  Interim History / Subjective:   Decannulated 01/29/2024 failed reinsertion despite using fiberoptic bronchoscopy  Objective   Blood pressure (!) 134/97, pulse 93, temperature 98.9 F (37.2 C), temperature source Oral, resp. rate (!) 21, height 5' 8 (1.727 m), weight 90 kg, SpO2 96%.    FiO2 (%):  [21 %] 21 %   Intake/Output Summary (Last 24 hours) at 01/31/2024 1121 Last data filed at 01/31/2024 0600 Gross per 24 hour  Intake 1224 ml  Output 1600 ml  Net -376 ml   Filed  Weights   01/27/24 0500 01/28/24 0500 01/31/24 0459  Weight: 91 kg 91 kg 90 kg   Physical Exam: Tracheostomy site is unremarkable Noisy respirations are noted Jaw thrust clear airway Heart sounds are regular Abdomen soft nontender   Assessment & Plan:  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  Acute hypoxic respiratory failure s/p  tracheostomy  Tracheostomy decannulated 01/29/2024 after respiratory therapy unable to pass suction catheter Bedside fiberoptic bronchoscopy revealed ulcerations Garrett [name] tracheostomy distal to multiple attempts to replace tracheostomy on 6-4 failed at bedside. He remains decannulated respiratory his respirations and some mechanical blockage of his upper airway from time. Continues to saturate well at this time. Monitor May need tracheostomy in place in the future possibly in the operating room  Garrett [name] ACNP Acute Care Nurse Practitioner Garrett [name] Pulmonary/Critical Care Please consult Amion 01/31/2024, 11:21 AM

## 2024-01-31 NOTE — Progress Notes (Signed)
   01/30/24 2200  Provider Notification  Provider Name/Title Dr. Melford Spotted,  Date Provider Notified 01/30/24  Time Provider Notified 2200  Method of Notification Page  Notification Reason Change in status (Some problem with Tracheostomy tube dislodged per RT, Jessica, unable to suction through tracheostomy tube. Pt is vital sign stable, SPO2 95-97% on trach collar.  Provider response Other (Comment) (referred to Critical care team, Dr. Madelynn Schilder, MD)  Date of Provider Response 01/30/24  Time of Provider Response 2200   Brain Cahill, RN

## 2024-02-01 ENCOUNTER — Inpatient Hospital Stay (HOSPITAL_COMMUNITY)

## 2024-02-01 DIAGNOSIS — Z430 Encounter for attention to tracheostomy: Secondary | ICD-10-CM | POA: Diagnosis not present

## 2024-02-01 DIAGNOSIS — J9601 Acute respiratory failure with hypoxia: Secondary | ICD-10-CM | POA: Diagnosis not present

## 2024-02-01 DIAGNOSIS — I619 Nontraumatic intracerebral hemorrhage, unspecified: Secondary | ICD-10-CM | POA: Diagnosis not present

## 2024-02-01 DIAGNOSIS — I613 Nontraumatic intracerebral hemorrhage in brain stem: Secondary | ICD-10-CM | POA: Diagnosis not present

## 2024-02-01 LAB — BASIC METABOLIC PANEL WITH GFR
Anion gap: 11 (ref 5–15)
BUN: 21 mg/dL — ABNORMAL HIGH (ref 6–20)
CO2: 28 mmol/L (ref 22–32)
Calcium: 9.4 mg/dL (ref 8.9–10.3)
Chloride: 99 mmol/L (ref 98–111)
Creatinine, Ser: 0.51 mg/dL — ABNORMAL LOW (ref 0.61–1.24)
GFR, Estimated: >60 mL/min (ref >60)
Glucose, Bld: 116 mg/dL — ABNORMAL HIGH (ref 70–99)
Potassium: 4.1 mmol/L (ref 3.5–5.1)
Sodium: 138 mmol/L (ref 135–145)

## 2024-02-01 LAB — CBC
HCT: 38.9 % — ABNORMAL LOW (ref 39.0–52.0)
Hemoglobin: 12.1 g/dL — ABNORMAL LOW (ref 13.0–17.0)
MCH: 28.7 pg (ref 26.0–34.0)
MCHC: 31.1 g/dL (ref 30.0–36.0)
MCV: 92.2 fL (ref 80.0–100.0)
Platelets: 542 10*3/uL — ABNORMAL HIGH (ref 150–400)
RBC: 4.22 MIL/uL (ref 4.22–5.81)
RDW: 15.1 % (ref 11.5–15.5)
WBC: 8.9 10*3/uL (ref 4.0–10.5)
nRBC: 0 % (ref 0.0–0.2)

## 2024-02-01 LAB — GLUCOSE, CAPILLARY
Glucose-Capillary: 171 mg/dL — ABNORMAL HIGH (ref 70–99)
Glucose-Capillary: 101 mg/dL — ABNORMAL HIGH (ref 70–99)
Glucose-Capillary: 137 mg/dL — ABNORMAL HIGH (ref 70–99)
Glucose-Capillary: 117 mg/dL — ABNORMAL HIGH (ref 70–99)
Glucose-Capillary: 117 mg/dL — ABNORMAL HIGH (ref 70–99)
Glucose-Capillary: 119 mg/dL — ABNORMAL HIGH (ref 70–99)

## 2024-02-01 MED ORDER — CIPROFLOXACIN HCL 0.3 % OP SOLN
1.0000 [drp] | OPHTHALMIC | Status: AC
  Administered 2024-02-02 – 2024-02-06 (×18): 1 [drp] via OPHTHALMIC
  Filled 2024-02-01: qty 2.5

## 2024-02-01 NOTE — Progress Notes (Signed)
 RRT called by RN per family request to NT suction pt. PRN NT suction orders placed by PCCM. During RRT's assessment, pt was able to strongly cough up the mucous to the back of his throat where it could be suctioned out. Pt's vitals are stable on RA. It is noted that pt's WOB is mildly increased with shoulder movement up and down on inspiration with a grunting sound. RN made aware. CPAP may be beneficial if this doesn't improve. Post trach site is 90% healed at this time.

## 2024-02-01 NOTE — Progress Notes (Signed)
 PROGRESS NOTE    Garrett [name]  ZOX:[id number] DOB: 05-31-1976 DOA: 10/27/2023 PCP: Pcp, No   Brief Narrative: 48 year old with no known previous medical history was found down on 10/27/2023, unresponsive with UDS positive for opioids.  He was minimally responsive to Narcan  and he was found to be markedly hypertensive-  blood pressure 262/156.  CT head notable for 4.1 cm intracranial hemorrhage at the pons with intraventricular extension into the fourth ventricle and basal cisterns.  Initially admitted by critical care, and now extubated with tracheostomy, initially placed on 3/20, replaced on 01/05/2024.  PEG tube placed 11/08/2023.   Remains with poor response.  Stable on trach and PEG.  Disposition remains difficult given patient's need for PEG and tracheostomy tube care.  Currently awaiting disability.   Significant hospital events: 3/8: Intubated in ED, pontine bleed/SAH. CT head Acute large hemorrhage 4.1 cm in pons with intraventricular extension without hydrocephalus. 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  Can follow commands with eyes. 3/16 Purposeful with left upper extremity  3/22 tracheostomy placed at bedside, bleeding issues overnight from trach site 3/24 PEG 3/24 sputum culture with MRSA 4/20: trach was changed to cuffless #8 5/1: Mental status appears to be somewhat improving off narcotics, more awake alert following simple commands(able to grimace, move left toes, track vertically with right eye).  Speech following. Trach downsized to [employer] cuffless #6 XLT 5/4: Modafinil  held, low threshold to resume if mental status/ability to interact diminishes 5/10: PEG dislodged, foley bulb placed and IR consulted to replace tube 5/11: IR replaced PEG tube 5/13 - 5/14: + Fever.  Restarted antibiotics.  Urine culture + E. Coli, foley catheter changed. 5/17 Tracheostomy changed.   5/18: Completed antibiotics for  UTI 5/23: Stable, no significant change, awaiting disability for placement. 6-11 Tracheostomy de cannulated 01/29/2024 after RT couldn't pass suction catheter.    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  CT head with acute large hemorrhage 4.1 cm in the pons, ventricular extension. blood pressure 260/1 50s on admission. Was admitted to the ICU and followed by Neuro.  - 2D echo ejection fraction 60%, A1c 4.7, LDL 96. - Was also treated with 3% hypertonic saline.  Follow-up CT head 3/29 with reduction in size of hemorrhage. -Patient now has trach and PEG in placed. PCCM following.  Speech therapy, PT OT is following. Needs placement.    Acute respiratory failure secondary to large pontine stroke brainstem compression Unable to protect airway/possible aspiration pneumonia - Now has trach collar.   PCCM intermittently following.  Recommended not a candidate for decannulation.  On 6/1 became tachycardic, tachypneic and febrile.  Chest x-ray unremarkable and procalcitonin was negative.  Initially started on vancomycin  and cefepime  later transitioned to vancomycin  as sputum cultures grew MRSA.  -He completed 7 days of vancomycin  on 01/26/2024  -De cannulated 6/11 after RT could pass suction catheter. CCM following, plan to monitor for now, may need tracheostomy in placed in future  per CCM -Bedside fiberoptic bronchoscopy: Revealed ulcerations [place], tracheostomy distal multiples attempt made to replaced trach.  -  Per CCM patient developed respiratory distress,  inability to maintain his airway or trouble clearing secretions will need to have low threshold to reintubate. Chest x-ray 6/13 negative for PNA  Hypertension: Continue Norvasc , losartan , Lopressor    Transaminases: Improved.  CT abdomen pelvis negative, acute hepatitis panel negative. LFT: stable. AST 58, ALT 210   E. coli UTI  Completed antibiotics course 5/18 Foley catheter was changed 5/13.   Foley exchange 6/12.-----neds to exchange foley 7/12   Moderate to severe protein caloric malnutrition, POA Peg in place, TF per RD.    PEG tube malfunction, resolved PEG dislodged 5/10, replaced on 5/11 by IR   Exposure keratopathy, OS Seen by ophthalmology, Dr. Mason Sole on 4/7.  Treated with maxitrol ointment (neo-poly-dex) TID OS for 1 week. -- Refresh PM TID to QID OS for chronic lubrication   AKI with acute urinary retention Resolved.  Has Foley in placed   Macrocytic anemia thrombocytosis  stable/resolved   Perineal skin injury, POA  Continue Gerhard's Butt cream   Relative B12 and folate deficiency Continue replacement with MVI    Goal of care: Currently DNR with intervention.   Nutrition Problem: Inadequate oral intake Etiology: inability to eat   Interventions: Tube feeding, Prostat, MVI, Juven     Nutrition Problem: Inadequate oral intake Etiology: inability to eat    Signs/Symptoms: NPO status    Interventions: Tube feeding, Prostat, MVI, Juven  Estimated body mass index is 31.17 kg/m as calculated from the following:   Height as of this encounter: 5' 8 (1.727 m).   Weight as of this encounter: 93 kg.   DVT prophylaxis: Lovenox  Code Status: DNR Family Communication: Significant other at bedside 6/12.  Disposition Plan:  Status is: Inpatient Remains inpatient appropriate because: awaiting placement    Consultants:  Neurology CCM Ophthalmology      Subjective: He is alert, track some times.   Objective: Vitals:   02/01/24 0551  02/01/24 0822 02/01/24 1034 02/01/24 1109  BP: (!) 122/90 126/88 126/88 (!) 130/90  Pulse: 80 71 71 73  Resp: 20 19  19   Temp: 98.5 F (36.9 C) 97.8 F (36.6 C)  98.2 F (36.8 C)  TempSrc: Oral Oral  Axillary  SpO2: 97% 94%  92%  Weight:      Height:        Intake/Output Summary (Last 24 hours) at 02/01/2024 1222 Last data filed at 02/01/2024 0430 Gross per 24 hour  Intake 1374 ml  Output 750 ml  Net 624 ml   Filed Weights   01/28/24 0500 01/31/24 0459 02/01/24 0416  Weight: 91 kg 90 kg 93 kg    Examination:  General exam: NAD Respiratory system: BL ronchus.  Cardiovascular system: S 1, S 2 RRR Gastrointestinal system: BS present, soft, ntt Central nervous system: Alert, does not follows command Extremities: no edema   Data Reviewed: I have personally reviewed following labs and imaging studies  CBC: Recent Labs  Lab 01/28/24 0739 01/31/24 0806 02/01/24 1118  WBC 7.8 12.6* 8.9  HGB 12.5* 12.0* 12.1*  HCT 40.1 39.7 38.9*  MCV 92.4 92.1 92.2  PLT 586* 596* 542*   Basic Metabolic Panel: Recent Labs  Lab 01/28/24 0739 01/31/24 0806 02/01/24 1118  NA 138 139 138  K 4.2 4.1 4.1  CL 103 100 99  CO2 25 28 28   GLUCOSE 97 159* 116*  BUN 23* 21* 21*  CREATININE 0.55* 0.64 0.51*  CALCIUM 9.6 9.5 9.4  MG 2.1 2.0  --  GFR: Estimated Creatinine Clearance: 126.3 mL/min (A) (by C-G formula based on SCr of 0.51 mg/dL (L)). Liver Function Tests: Recent Labs  Lab 01/31/24 0806  AST 58*  ALT 210*  ALKPHOS 62  BILITOT 0.4  PROT 7.6  ALBUMIN  3.0*   No results for input(s): LIPASE, AMYLASE in the last 168 hours. No results for input(s): AMMONIA in the last 168 hours. Coagulation Profile: No results for input(s): INR, PROTIME in the last 168 hours. Cardiac Enzymes: No results for input(s): CKTOTAL, CKMB, CKMBINDEX, TROPONINI in the last 168 hours. BNP (last 3 results) No results for input(s): PROBNP in the last 8760 hours. HbA1C: No  results for input(s): HGBA1C in the last 72 hours. CBG: Recent Labs  Lab 01/31/24 1447 01/31/24 1852 01/31/24 2300 02/01/24 0551 02/01/24 0950  GLUCAP 168* 123* 110* 171* 101*   Lipid Profile: No results for input(s): CHOL, HDL, LDLCALC, TRIG, CHOLHDL, LDLDIRECT in the last 72 hours. Thyroid Function Tests: No results for input(s): TSH, T4TOTAL, FREET4, T3FREE, THYROIDAB in the last 72 hours. Anemia Panel: No results for input(s): VITAMINB12, FOLATE, FERRITIN, TIBC, IRON, RETICCTPCT in the last 72 hours. Sepsis Labs: No results for input(s): PROCALCITON, LATICACIDVEN in the last 168 hours.  No results found for this or any previous visit (from the past 240 hours).        Radiology Studies: DG CHEST PORT 1 VIEW Result Date: 02/01/2024 CLINICAL DATA:  100030 Leukocytosis 100030 EXAM: PORTABLE CHEST - 1 VIEW COMPARISON:  January 20, 2024 FINDINGS: Interval removal of the tracheostomy tube. Elevation of the right hemidiaphragm. No focal airspace consolidation, pleural effusion, or pneumothorax. Mild cardiomegaly. No acute fracture or destructive lesion. Multilevel thoracic osteophytosis. IMPRESSION: Mild cardiomegaly, unchanged.  No pneumonia or pulmonary edema. Electronically Signed   By: Rance Burrows M.D.   On: 02/01/2024 10:20        Scheduled Meds:  amLODipine   10 mg Per Tube Daily   artificial tears   Left Eye Q8H   Chlorhexidine  Gluconate Cloth  6 each Topical Daily   ciprofloxacin  1 drop Left Eye Q4H while awake   enoxaparin  (LOVENOX ) injection  40 mg Subcutaneous Daily   famotidine   20 mg Per Tube BID   feeding supplement (OSMOLITE 1.5 CAL)  237 mL Per Tube 5 X Daily   feeding supplement (PROSource TF20)  60 mL Per Tube TID   fiber supplement (BANATROL TF)  60 mL Per Tube BID   free water   150 mL Per Tube Q4H   insulin  aspart  0-15 Units Subcutaneous 5 times per day   losartan   100 mg Per Tube Daily   metoprolol  tartrate   100 mg Per Tube BID   multivitamin with minerals  1 tablet Per Tube Daily   mouth rinse  15 mL Mouth Rinse 4 times per day   Continuous Infusions:   LOS: 97 days    Time spent: 35 minutes    Santiel Topper A Aizen Duval, MD [facility]   If 7PM-7AM, please contact night-coverage [email or web address]  02/01/2024, 12:22 PM

## 2024-02-01 NOTE — Progress Notes (Signed)
 Physical Therapy Treatment Patient Details Name: Garrett [name] MRN: [id number] DOB: September 14, 1975 Today's Date: 02/01/2024   History of Present Illness Pt is a 48 yo male who was found down 10/27/23 with agonal breathing. 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 Intubated 3/8, cortrak placed 3/14, trach placed 3/22, PEG placed 3/24. PMH: HTN, smoker    PT Comments  Pt with similar presentation to previous session. Bed placed in chair position to work on PROM of all extremities. Pt noted with visual tracking to the L however unable to track to the R. No change in DC/DME recs at this time. PT will continue to follow.     If plan is discharge home, recommend the following: Two people to help with walking and/or transfers;Two people to help with bathing/dressing/bathroom;Assistance with cooking/housework;Assistance with feeding;Direct supervision/assist for medications management;Direct supervision/assist for financial management;Assist for transportation;Help with stairs or ramp for entrance;Supervision due to cognitive status   Can travel by private vehicle        Equipment Recommendations  [place] lift;Hospital bed;Wheelchair (measurements PT);Wheelchair cushion (measurements PT);BSC/3in1;Other (comment)    Recommendations for Other Services       Precautions / Restrictions Precautions Precautions: Fall Recall of Precautions/Restrictions: Impaired Precaution/Restrictions Comments: trach, PEG, abdominal binder, SBP < 160 Required Braces or Orthoses: Other Brace Splint/Cast: Bil resting hand splints Other Brace: has bilateral prevalon boots Restrictions Weight Bearing Restrictions Per Provider Order: No     Mobility  Bed Mobility               General bed mobility comments: Bed placed in chair position     Transfers                        Ambulation/Gait                   Stairs             Wheelchair Mobility     Tilt Bed    Modified Rankin (Stroke Patients Only) Modified Rankin (Stroke Patients Only) Pre-Morbid Rankin Score: No symptoms Modified Rankin: Severe disability     Balance                                            Communication Communication Communication: Impaired Factors Affecting Communication: Trach/intubated  Cognition Arousal: Alert Behavior During Therapy: Flat affect   PT - Cognitive impairments: Difficult to assess Difficult to assess due to: Tracheostomy, Impaired communication                     PT - Cognition Comments: Pt able to track with eyes slightly however unable to nod today. Following commands: Impaired Following commands impaired: Follows one step commands inconsistently, Follows one step commands with increased time    Cueing Cueing Techniques: Verbal cues, Visual cues  Exercises Other Exercises Other Exercises: PROM BUEs in supine Other Exercises: PROM on BLE in sitting.    General Comments General comments (skin integrity, edema, etc.): VSS      Pertinent Vitals/Pain Pain Assessment Pain Assessment: Faces Faces Pain Scale: No hurt    Home Living  Prior Function            PT Goals (current goals can now be found in the care plan section) Progress towards PT goals: Progressing toward goals    Frequency    Min 2X/week      PT Plan      Co-evaluation              AM-PAC PT 6 Clicks Mobility   Outcome Measure  Help needed turning from your back to your side while in a flat bed without using bedrails?: Total Help needed moving from lying on your back to sitting on the side of a flat bed without using bedrails?: Total Help needed moving to and from a bed to a chair (including a wheelchair)?: Total Help needed  standing up from a chair using your arms (e.g., wheelchair or bedside chair)?: Total Help needed to walk in hospital room?: Total Help needed climbing 3-5 steps with a railing? : Total 6 Click Score: 6    End of Session Equipment Utilized During Treatment: Oxygen Activity Tolerance: Patient tolerated treatment well Patient left: in bed;with call bell/phone within reach Nurse Communication: Need for lift equipment PT Visit Diagnosis: Muscle weakness (generalized) (M62.81);Other symptoms and signs involving the nervous system (R29.898);Hemiplegia and hemiparesis Hemiplegia - Right/Left: Right Hemiplegia - dominant/non-dominant: Dominant Hemiplegia - caused by: Nontraumatic intracerebral hemorrhage     Time: 8295-6213 PT Time Calculation (min) (ACUTE ONLY): 15 min  Charges:    $Therapeutic Activity: 8-22 mins PT General Charges $$ ACUTE PT VISIT: 1 Visit                     Mitesh Rosendahl B, PT, DPT Acute Rehab Services [phone]    Brix Brearley 02/01/2024, 2:22 PM

## 2024-02-01 NOTE — Progress Notes (Signed)
 NAME:  Garrett [name], MRN:  [id number], DOB:  Feb 02, 1976, LOS: 97 ADMISSION DATE:  10/27/2023, CONSULTATION DATE:  10/27/2023 REFERRING MD:  Florentino Hurdle, DO CHIEF COMPLAINT:  Found Down  History of Present Illness:  48 y/o male with past medical history of hypertension who was found down for unknown downtime by his fiance when she came home from work. Reportedly, having agonal breathing.  He apparently had driven her to work this morning.  He has HTN but never checks his BP and does not follow with MD.  She says you can tell when his BP is really high; his eyes get blood shot and he is sweating.  No h/o seizures.  Besides almost daily [name] and cigarette smoking, she is not aware of any other drugs. Drug screen positive for opioids and he was given several doses of Narcan . Patient found to have:  1. 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 Basal cisterns and fourth ventricle are effaced without hydrocephalus at this time. 2. Trace additional subarachnoid hemorrhage along the left parietal convexity. BP in ED 262/156. His CXR revealed a RUL and perihilar infiltrates suggesting aspiration pneumonia. ED labs also revealed PH 7.169, LA 4.4, CPK 985. Patient was intubated in ED.  Pertinent  Medical History  hypertension  Significant Hospital Events: Including procedures, antibiotic start and stop dates in addition to other pertinent events   3/8: Intubated in ED, pontine bleed/SAH. CT head 17:09 Acute large hemorrhage 4.1 cm in pons with intraventricular extension without hydrocephalus 3/9: CTA 03:14 AM No change in IPH in pons, similar biparietal [facility] 3/14: Now awake, appears locked in can follow commands with eyes. 3/16 Purposeful with left upper extremity  3/22 tracheostomy placed at bedside, bleeding issues overnight from trach site 3/24 PEG (Dr. Hildy Lowers) 3/24  sputum culture with MRSA 3/27 vomited. TF on hold. Abd film c/w ileus. Added reglan , got SSE. Did tolerate PSV all day  3/28 BMs x2 after SSE day prior. Added back TFs at 1/2 rated. Tolerated PS 3/29 Tolerated PS  3/31 Tolerating CPAP PS 15/5, TF on hold due to ileus. 4/1: con't to hold tube feeds today, restarting vancomycin  and sending tracheal aspirate as peaks are 37, plat 24, driving 19. Thick secretions. Fever overnight.  4/2: peaks improved. Ongoing hiccups. Trickle feeding. Neuro exam unchanged.  4/20: trach was changed to cuffless #8 4/28: not safe to swallow. Limited communication and he follows simple commands. On TF 5/1: on TC 28%, with large secretions. Working with speech and he is improving to initiate PMV trials under ST supervision   5/5: on TC 30%, 7 L/min. Trach #6 cuffless, changed 5/1 to facilitate PMV trials  5/12: on TC 21%, 6 L/min. Trach #6 cuffless, unable to produce sounds or speak on PMV. No significant resp secretions. On PEG TF 01/29/2024 tracheostomy was removed due to failure to well provide adequate airway  Interim History / Subjective:   Monitor post- self decannulation of tracheostomy. On RA. Not able to participate in exam/ ask questions due to mental status.   Objective   Blood pressure (!) 122/90, pulse 80, temperature 98.5 F (36.9 C), temperature source Oral, resp. rate 20, height 5' 8 (1.727 m), weight 93 kg, SpO2 97%.        Intake/Output Summary (Last 24 hours) at 02/01/2024 0805 Last data filed at 02/01/2024 0430 Gross per 24 hour  Intake 1374 ml  Output 750 ml  Net 624 ml   Filed Weights   01/28/24 0500 01/31/24 0459 02/01/24 0416  Weight: 91 kg 90 kg 93 kg   Physical Exam: Chronically ill appearing man lying in bed in NAD [place]/AT, cataract left eye Breathing comfortably on RA with saturation 94%. No rhochi, no snoring. Mild tachypnea.  S1S2, RRR Abd nondistended, nontender.   No updated labs today.   Assessment & Plan:  Large  pontine hemorrhage with IVH and brainstem compression  Acute hypoxic respiratory failure s/p tracheostomy  -Remains stable without a tracheostomy; certainly at risk for long-term complications of his encephalopathy and having risk for aspiration, which a tracheostomy will not prevent. If he has an effective cough, that would be the most protective thing for him. Redo tracheostomy would only be beneficial if he required assistance with clearing secretions. At this point this is not necessary. I would recommend holding off on intubation w/ subsequent repeat trach unless he has airway occlusion or is unable to clear secretions. Due to mental status, he remains at risk for pneumonias and hospital acquired infections unfortunately.  -If he develops respiratory distress, inability to maintain his airway, or trouble clearing secretions, I would have a low threshold to reintubate. Please call if these symptoms develop.  PCCM will follow up next week; please call in the interim with questions.  Joesph Mussel, DO 02/01/24 8:07 AM [facility]  For contact information, see Amion. If no response to pager, please call PCCM consult pager. After hours, 7PM- 7AM, please call Elink.

## 2024-02-01 NOTE — Plan of Care (Addendum)
 Plan of care has been reviewed. Pt is progressing, vital signs stable, afebrile. On room air, SPO2 93-97%, occasionally oropharyngeal suction provided by RT, no acute distress. Trach decannulation was done on 01/31/24 by PCCM. Pt has been well tolerated on room air. NPO, PEG tube with intermittent feeding q 5 hrs, Pt receives Osmolite 1.5 cal very well. Last formed BM was 01/31/24. Pt has adequate urine output. We will continue to monitor.  Problem: Nutritional: Goal: Maintenance of adequate nutrition will improve Outcome: Progressing Goal: Progress toward achieving an optimal weight will improve Outcome: Progressing   Problem: Skin Integrity: Goal: Risk for impaired skin integrity will decrease Outcome: Progressing   Problem: Tissue Perfusion: Goal: Adequacy of tissue perfusion will improve Outcome: Progressing   Problem: Clinical Measurements: Goal: Ability to maintain clinical measurements within normal limits will improve Outcome: Progressing Goal: Will remain free from infection Outcome: Progressing Goal: Diagnostic test results will improve Outcome: Progressing Goal: Respiratory complications will improve Outcome: Progressing Goal: Cardiovascular complication will be avoided Outcome: Progressing  Brain Cahill, RN

## 2024-02-02 DIAGNOSIS — Z430 Encounter for attention to tracheostomy: Secondary | ICD-10-CM | POA: Diagnosis not present

## 2024-02-02 DIAGNOSIS — I619 Nontraumatic intracerebral hemorrhage, unspecified: Secondary | ICD-10-CM | POA: Diagnosis not present

## 2024-02-02 LAB — BASIC METABOLIC PANEL WITH GFR
Anion gap: 13 (ref 5–15)
BUN: 23 mg/dL — ABNORMAL HIGH (ref 6–20)
CO2: 28 mmol/L (ref 22–32)
Calcium: 9.8 mg/dL (ref 8.9–10.3)
Chloride: 99 mmol/L (ref 98–111)
Creatinine, Ser: 0.53 mg/dL — ABNORMAL LOW (ref 0.61–1.24)
GFR, Estimated: >60 mL/min (ref >60)
Glucose, Bld: 102 mg/dL — ABNORMAL HIGH (ref 70–99)
Potassium: 4 mmol/L (ref 3.5–5.1)
Sodium: 140 mmol/L (ref 135–145)

## 2024-02-02 LAB — GLUCOSE, CAPILLARY
Glucose-Capillary: 103 mg/dL — ABNORMAL HIGH (ref 70–99)
Glucose-Capillary: 106 mg/dL — ABNORMAL HIGH (ref 70–99)
Glucose-Capillary: 124 mg/dL — ABNORMAL HIGH (ref 70–99)
Glucose-Capillary: 110 mg/dL — ABNORMAL HIGH (ref 70–99)
Glucose-Capillary: 81 mg/dL (ref 70–99)

## 2024-02-02 NOTE — Plan of Care (Signed)
  Problem: Education: Goal: Knowledge of disease or condition will improve Outcome: Progressing   Problem: Intracerebral Hemorrhage Tissue Perfusion: Goal: Complications of Intracerebral Hemorrhage will be minimized Outcome: Progressing   Problem: Health Behavior/Discharge Planning: Goal: Ability to manage health-related needs will improve Outcome: Progressing   Problem: Self-Care: Goal: Ability to communicate needs accurately will improve Outcome: Progressing   Problem: Nutrition: Goal: Risk of aspiration will decrease Outcome: Progressing   Problem: Nutrition: Goal: Dietary intake will improve Outcome: Progressing   Problem: Skin Integrity: Goal: Risk for impaired skin integrity will decrease Outcome: Progressing   Problem: Safety: Goal: Ability to remain free from injury will improve Outcome: Progressing   Problem: Skin Integrity: Goal: Risk for impaired skin integrity will decrease Outcome: Progressing

## 2024-02-02 NOTE — Progress Notes (Signed)
 PROGRESS NOTE    Garrett [name]  ZOX:[id number] DOB: November 22, 1975 DOA: 10/27/2023 PCP: Pcp, No   Brief Narrative: 48 year old with no known previous medical history was found down on 10/27/2023, unresponsive with UDS positive for opioids.  He was minimally responsive to Narcan  and he was found to be markedly hypertensive-  blood pressure 262/156.  CT head notable for 4.1 cm intracranial hemorrhage at the pons with intraventricular extension into the fourth ventricle and basal cisterns.  Initially admitted by critical care, and now extubated with tracheostomy, initially placed on 3/20, replaced on 01/05/2024.  PEG tube placed 11/08/2023.   Remains with poor response.  Stable on trach and PEG.  Disposition remains difficult given patient's need for PEG and tracheostomy tube care.  Currently awaiting disability.   Significant hospital events: 3/8: Intubated in ED, pontine bleed/SAH. CT head Acute large hemorrhage 4.1 cm in pons with intraventricular extension without hydrocephalus. 3/9: CTA No change in IPH in pons, similar biparietal 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  Can follow commands with eyes. 3/16 Purposeful with left upper extremity  3/22 tracheostomy placed at bedside, bleeding issues overnight from trach site 3/24 PEG 3/24 Sputum culture with MRSA 4/20: trach was changed to cuffless #8 5/1: Mental status appears to be somewhat improving off narcotics, more awake alert following simple commands(able to grimace, move left toes, track vertically with right eye).  Speech following. Trach downsized to [employer] cuffless #6 XLT 5/4: Modafinil  held, low threshold to resume if mental status/ability to interact diminishes 5/10: PEG dislodged, foley bulb placed and IR consulted to replace tube 5/11: IR replaced PEG tube 5/13 - 5/14: + Fever.  Restarted antibiotics.  Urine culture + E. Coli, foley catheter changed. 5/17 Tracheostomy changed.   5/18: Completed antibiotics for  UTI 5/23: Stable, no significant change, awaiting disability for placement. 6-11 Tracheostomy de cannulated 01/29/2024 after RT couldn't pass suction catheter.    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  CT head with acute large hemorrhage 4.1 cm in the pons, ventricular extension. blood pressure 260/1 50s on admission. Was admitted to the ICU and followed by Neuro.  - 2D echo ejection fraction 60%, A1c 4.7, LDL 96. - Was also treated with 3% hypertonic saline.  Follow-up CT head 3/29 with reduction in size of hemorrhage. -Patient has  PEG in placed. PCCM following.  Speech therapy, PT OT is following. Needs placement.   Acute respiratory failure secondary to large pontine stroke brainstem compression Unable to protect airway/possible aspiration pneumonia - Now has trach collar.   PCCM intermittently following.  Recommended not a candidate for decannulation.  On 6/1 became tachycardic, tachypneic and febrile.  Chest x-ray unremarkable and procalcitonin was negative.  Initially started on vancomycin  and cefepime  later transitioned to vancomycin  as sputum cultures grew MRSA.  -He completed 7 days of vancomycin  on 01/26/2024  -De cannulated 6/11 after RT could pass suction catheter. CCM following, plan to monitor for now, may need tracheostomy in placed in future  per CCM -Bedside fiberoptic bronchoscopy: Revealed ulcerations Celano, tracheostomy distal multiples attempt made to replaced trach.  - Per CCM patient  developed respiratory distress, inability to maintain  his airway or trouble clearing secretions will need to have low threshold to reintubate. Chest x-ray 6/13 negative for PNA He has remain stable.   Hypertension: Continue Norvasc , Losartan , Lopressor    Transaminases: Improved.  CT abdomen pelvis negative, acute hepatitis panel negative. LFT: stable. AST 58, ALT 210   E. coli UTI  Completed antibiotics course 5/18 Foley catheter was changed 5/13. 6/12.  Needs to exchange foley 7/12   Moderate to severe protein caloric malnutrition, POA Peg in place, TF per RD.    PEG tube malfunction, resolved PEG dislodged 5/10, replaced on 5/11 by IR   Exposure keratopathy, OS Seen by ophthalmology, Dr. Mason Sole on 4/7.  Treated with maxitrol ointment (neo-poly-dex) TID OS for 1 week. -- Refresh PM TID to QID OS for chronic lubrication   AKI with acute urinary retention Resolved.  Has Foley in placed   Macrocytic anemia thrombocytosis  stable/resolved   Perineal skin injury, POA  Continue Gerhard's Butt cream   Relative B12 and folate deficiency Continue replacement with MVI    Goal of care: Currently DNR with intervention.   Nutrition Problem: Inadequate oral intake Etiology: inability to eat   Interventions: Tube feeding, Prostat, MVI, Juven     Nutrition Problem: Inadequate oral intake Etiology: inability to eat    Signs/Symptoms: NPO status    Interventions: Tube feeding, Prostat, MVI, Juven  Estimated body mass index is 31.17 kg/m as calculated from the following:   Height as of this encounter: 5' 8 (1.727 m).   Weight as of this encounter: 93 kg.   DVT prophylaxis: Lovenox  Code Status: DNR Family Communication: Significant other at bedside 6/12.  Disposition Plan:  Status is: Inpatient Remains inpatient appropriate because: awaiting placement    Consultants:  Neurology CCM Ophthalmology      Subjective: He is alert, non verbal.    Objective: Vitals:   02/02/24 0500 02/02/24 0806 02/02/24 1228  02/02/24 1544  BP:  136/88 (!) 139/90 (!) 139/97  Pulse:  87 88 90  Resp:  16 18 18   Temp:  98.9 F (37.2 C) 98.9 F (37.2 C) 99 F (37.2 C)  TempSrc:  Oral Oral Oral  SpO2:  99% 98% 99%  Weight: 93 kg     Height:        Intake/Output Summary (Last 24 hours) at 02/02/2024 1629 Last data filed at 02/02/2024 1045 Gross per 24 hour  Intake 60 ml  Output 2300 ml  Net -2240 ml   Filed Weights   01/31/24 0459 02/01/24 0416 02/02/24 0500  Weight: 90 kg 93 kg 93 kg    Examination:  General exam: NAD Respiratory system: BL ronchus Cardiovascular system: S 1, S 2 RRR Gastrointestinal system: BS present, soft, nt Central nervous system: Alert, does not follows command Extremities: no edema   Data Reviewed: I have personally reviewed following labs and imaging studies  CBC: Recent Labs  Lab 01/28/24 0739 01/31/24 0806 02/01/24 1118  WBC 7.8 12.6* 8.9  HGB 12.5* 12.0* 12.1*  HCT 40.1 39.7 38.9*  MCV 92.4 92.1 92.2  PLT 586* 596* 542*   Basic Metabolic Panel: Recent Labs  Lab 01/28/24 0739 01/31/24 0806 02/01/24 1118 02/02/24 0531  NA 138 139 138 140  K 4.2 4.1 4.1 4.0  CL 103 100 99 99  CO2 25 28 28 28   GLUCOSE 97 159* 116* 102*  BUN 23* 21* 21* 23*  CREATININE 0.55* 0.64 0.51* 0.53*  CALCIUM 9.6 9.5 9.4  9.8  MG 2.1 2.0  --   --    GFR: Estimated Creatinine Clearance: 126.3 mL/min (A) (by C-G formula based on SCr of 0.53 mg/dL (L)). Liver Function Tests: Recent Labs  Lab 01/31/24 0806  AST 58*  ALT 210*  ALKPHOS 62  BILITOT 0.4  PROT 7.6  ALBUMIN  3.0*   No results for input(s): LIPASE, AMYLASE in the last 168 hours. No results for input(s): AMMONIA in the last 168 hours. Coagulation Profile: No results for input(s): INR, PROTIME in the last 168 hours. Cardiac Enzymes: No results for input(s): CKTOTAL, CKMB, CKMBINDEX, TROPONINI in the last 168 hours. BNP (last 3 results) No results for input(s): PROBNP in the last 8760  hours. HbA1C: No results for input(s): HGBA1C in the last 72 hours. CBG: Recent Labs  Lab 02/01/24 2019 02/01/24 2250 02/02/24 0533 02/02/24 1026 02/02/24 1414  GLUCAP 117* 119* 103* 106* 124*   Lipid Profile: No results for input(s): CHOL, HDL, LDLCALC, TRIG, CHOLHDL, LDLDIRECT in the last 72 hours. Thyroid Function Tests: No results for input(s): TSH, T4TOTAL, FREET4, T3FREE, THYROIDAB in the last 72 hours. Anemia Panel: No results for input(s): VITAMINB12, FOLATE, FERRITIN, TIBC, IRON, RETICCTPCT in the last 72 hours. Sepsis Labs: No results for input(s): PROCALCITON, LATICACIDVEN in the last 168 hours.  No results found for this or any previous visit (from the past 240 hours).        Radiology Studies: DG CHEST PORT 1 VIEW Result Date: 02/01/2024 CLINICAL DATA:  100030 Leukocytosis 100030 EXAM: PORTABLE CHEST - 1 VIEW COMPARISON:  January 20, 2024 FINDINGS: Interval removal of the tracheostomy tube. Elevation of the right hemidiaphragm. No focal airspace consolidation, pleural effusion, or pneumothorax. Mild cardiomegaly. No acute fracture or destructive lesion. Multilevel thoracic osteophytosis. IMPRESSION: Mild cardiomegaly, unchanged.  No pneumonia or pulmonary edema. Electronically Signed   By: Rance Burrows M.D.   On: 02/01/2024 10:20        Scheduled Meds:  amLODipine   10 mg Per Tube Daily   artificial tears   Left Eye Q8H   Chlorhexidine  Gluconate Cloth  6 each Topical Daily   ciprofloxacin  1 drop Left Eye Q4H while awake   enoxaparin  (LOVENOX ) injection  40 mg Subcutaneous Daily   famotidine   20 mg Per Tube BID   feeding supplement (OSMOLITE 1.5 CAL)  237 mL Per Tube 5 X Daily   feeding supplement (PROSource TF20)  60 mL Per Tube TID   fiber supplement (BANATROL TF)  60 mL Per Tube BID   free water   150 mL Per Tube Q4H   insulin  aspart  0-15 Units Subcutaneous 5 times per day   losartan   100 mg Per Tube Daily    metoprolol  tartrate  100 mg Per Tube BID   multivitamin with minerals  1 tablet Per Tube Daily   mouth rinse  15 mL Mouth Rinse 4 times per day   Continuous Infusions:   LOS: 98 days    Time spent: 35 minutes    Francesa Eugenio A Cayne Yom, MD [facility]   If 7PM-7AM, please contact night-coverage [email or web address]  02/02/2024, 4:29 PM

## 2024-02-03 DIAGNOSIS — Z430 Encounter for attention to tracheostomy: Secondary | ICD-10-CM | POA: Diagnosis not present

## 2024-02-03 DIAGNOSIS — I619 Nontraumatic intracerebral hemorrhage, unspecified: Secondary | ICD-10-CM | POA: Diagnosis not present

## 2024-02-03 LAB — GLUCOSE, CAPILLARY
Glucose-Capillary: 108 mg/dL — ABNORMAL HIGH (ref 70–99)
Glucose-Capillary: 158 mg/dL — ABNORMAL HIGH (ref 70–99)
Glucose-Capillary: 78 mg/dL (ref 70–99)
Glucose-Capillary: 104 mg/dL — ABNORMAL HIGH (ref 70–99)

## 2024-02-03 MED ORDER — IPRATROPIUM-ALBUTEROL 0.5-2.5 (3) MG/3ML IN SOLN: 3.0000 mL | Freq: Three times a day (TID) | RESPIRATORY_TRACT | Status: DC

## 2024-02-03 MED ORDER — HYDRALAZINE HCL 10 MG PO TABS
10.0000 mg | ORAL_TABLET | Freq: Two times a day (BID) | ORAL | Status: DC
  Administered 2024-02-03 – 2024-02-25 (×42): 10 mg
  Filled 2024-02-03 (×46): qty 1

## 2024-02-03 NOTE — Plan of Care (Signed)
  Problem: Nutrition: Goal: Risk of aspiration will decrease Outcome: Progressing Goal: Dietary intake will improve Outcome: Progressing   Problem: Fluid Volume: Goal: Ability to maintain a balanced intake and output will improve Outcome: Progressing   Problem: Metabolic: Goal: Ability to maintain appropriate glucose levels will improve Outcome: Progressing   Problem: Skin Integrity: Goal: Risk for impaired skin integrity will decrease Outcome: Progressing

## 2024-02-03 NOTE — Progress Notes (Addendum)
 PROGRESS NOTE    Garrett [name]  FIE:[id number] DOB: 1976/01/21 DOA: 10/27/2023 PCP: Pcp, No   Brief Narrative: 48 year old with no known previous medical history was found down on 10/27/2023, unresponsive with UDS positive for opioids.  He was minimally responsive to Narcan  and he was found to be markedly hypertensive-  blood pressure 262/156.  CT head notable for 4.1 cm intracranial hemorrhage at the pons with intraventricular extension into the fourth ventricle and basal cisterns.  Initially admitted by critical care, and now extubated with tracheostomy, initially placed on 3/20, replaced on 01/05/2024.  PEG tube placed 11/08/2023.   Remains with poor response.  Stable on PEG.  Disposition remains difficult given patient's need for PEG. Currently awaiting disability. Tracheostomy de cannulated 01/29/2024 after RT couldn't pass suction catheter. He has remain stable. Plan to continue to monitor.    Significant hospital events: 3/8: Intubated in ED, pontine bleed/SAH. CT head Acute large hemorrhage 4.1 cm in pons with intraventricular extension without hydrocephalus. 3/9: CTA No change in IPH in pons, similar biparietal 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  Can follow commands with eyes. 3/16 Purposeful with left upper extremity  3/22 tracheostomy placed at bedside, bleeding issues overnight from trach site 3/24 PEG 3/24 Sputum culture with MRSA 4/20: trach was changed to cuffless #8 5/1: Mental status appears to be somewhat improving off narcotics, more awake alert following simple commands(able to grimace, move left toes, track vertically with right eye).  Speech following. Trach downsized to [employer] cuffless #6 XLT 5/4: Modafinil  held, low threshold to resume if mental status/ability to interact diminishes 5/10: PEG dislodged, foley bulb placed and IR consulted to replace tube 5/11: IR replaced PEG tube 5/13 - 5/14: + Fever.  Restarted antibiotics.  Urine culture + E. Coli,  foley catheter changed. 5/17 Tracheostomy changed.   5/18: Completed antibiotics for UTI 5/23: Stable, no significant change, awaiting disability for placement. 6-11 Tracheostomy de cannulated 01/29/2024 after RT couldn't pass suction catheter.    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  CT head with acute large hemorrhage 4.1 cm in the pons, ventricular extension. blood pressure 260/1 50s on admission. Was admitted to the ICU and followed by Neuro.  - 2D echo ejection fraction 60%, A1c 4.7, LDL 96. - Was also treated with 3% hypertonic saline.  Follow-up CT head 3/29 with reduction in size of hemorrhage. -Patient has  PEG in placed. PCCM following.  Speech therapy, PT OT is following. Needs placement.   Acute respiratory failure secondary to large pontine stroke brainstem compression Unable to protect airway/possible aspiration pneumonia - Now has trach collar.   PCCM intermittently following.  Recommended not a candidate for decannulation.  On 6/1 became tachycardic, tachypneic and febrile.  Chest x-ray unremarkable and procalcitonin was negative.  Initially started on vancomycin  and cefepime  later transitioned to vancomycin  as sputum cultures grew MRSA.  -He completed 7 days of vancomycin  on 01/26/2024  -De cannulated 6/11 after RT could pass suction catheter. CCM following, plan to monitor for now, may need tracheostomy in placed in future  per CCM -Bedside fiberoptic bronchoscopy: Revealed ulcerations Celano,  tracheostomy distal multiples attempt made to  replaced trach.  - Per CCM patient developed respiratory distress, inability to maintain his airway or trouble clearing secretions will need to have low threshold to reintubate. Chest x-ray 6/13 negative for PNA Remain stable, he has not required frequent suctioning.   Hypertension: Continue Norvasc , Losartan , Lopressor   Added low dose Hydralazine  BID  Transaminases: Improved.  CT abdomen pelvis negative, acute hepatitis panel negative. LFT: stable. AST 58, ALT 210   E. coli UTI  Completed antibiotics course 5/18 Foley catheter was changed 5/13. 6/12.  Needs to exchange foley 7/12   Moderate to severe protein caloric malnutrition, POA Peg in place, TF per RD.    PEG tube malfunction, resolved PEG dislodged 5/10, replaced on 5/11 by IR   Exposure keratopathy, OS Seen by ophthalmology, Dr. Mason Sole on 4/7.  Treated with maxitrol ointment (neo-poly-dex) TID OS for 1 week. -- Refresh PM TID to QID OS for chronic lubrication   AKI with acute urinary retention Resolved.  Has Foley in placed   Macrocytic anemia thrombocytosis  stable/resolved   Perineal skin injury, POA  Continue Gerhard's Butt cream   Relative B12 and folate deficiency Continue replacement with MVI    Goal of care: Currently DNR with intervention.   Nutrition Problem: Inadequate oral intake Etiology: inability to eat   Interventions: Tube feeding, Prostat, MVI, Juven     Nutrition Problem: Inadequate oral intake Etiology: inability to eat    Signs/Symptoms: NPO status    Interventions: Tube feeding, Prostat, MVI, Juven  Estimated body mass index is 31.17 kg/m as calculated from the following:   Height as of this encounter: 5' 8 (1.727 m).   Weight as of this encounter: 93 kg.   DVT prophylaxis: Lovenox  Code Status: DNR Family Communication: Significant other at bedside 6/12.  Disposition Plan:  Status is: Inpatient Remains inpatient appropriate because: awaiting  placement    Consultants:  Neurology CCM Ophthalmology      Subjective: Non verbal. Does not follow command, other than squeezed with right hand.   Objective: Vitals:   02/02/24 2323 02/03/24 0408 02/03/24 0426 02/03/24 0836  BP: (!) 125/98  (!) 152/97 (!) 140/89  Pulse: 68  79 70  Resp: 18  18 18   Temp: 98.3 F (36.8 C)  99.7 F (37.6 C) 98.2 F (36.8 C)  TempSrc: Oral  Axillary Axillary  SpO2: 98%  100% 96%  Weight:  93 kg    Height:        Intake/Output Summary (Last 24 hours) at 02/03/2024 1110 Last data filed at 02/03/2024 0952 Gross per 24 hour  Intake 550 ml  Output 1250 ml  Net -700 ml   Filed Weights   02/01/24 0416 02/02/24 0500 02/03/24 0408  Weight: 93 kg 93 kg 93 kg    Examination:  General exam: NAD Respiratory system: BL ronchus.  Cardiovascular system: S 1, S 2 RRR Gastrointestinal system: BS present, soft,  nt Central nervous system: Alert, does not follows command Extremities: no edema   Data Reviewed: I have personally reviewed following labs and imaging studies  CBC: Recent Labs  Lab 01/28/24 0739 01/31/24 0806 02/01/24 1118  WBC 7.8 12.6* 8.9  HGB 12.5* 12.0* 12.1*  HCT 40.1 39.7 38.9*  MCV 92.4 92.1 92.2  PLT 586* 596* 542*   Basic Metabolic Panel: Recent Labs  Lab 01/28/24 0739 01/31/24 0806 02/01/24 1118 02/02/24 0531  NA 138 139 138 140  K 4.2 4.1 4.1 4.0  CL 103 100  99 99  CO2 25 28 28 28   GLUCOSE 97 159* 116* 102*  BUN 23* 21* 21* 23*  CREATININE 0.55* 0.64 0.51* 0.53*  CALCIUM 9.6 9.5 9.4 9.8  MG 2.1 2.0  --   --    GFR: Estimated Creatinine Clearance: 126.3 mL/min (A) (by C-G formula based on SCr of 0.53 mg/dL (L)). Liver Function Tests: Recent Labs  Lab 01/31/24 0806  AST 58*  ALT 210*  ALKPHOS 62  BILITOT 0.4  PROT 7.6  ALBUMIN  3.0*   No results for input(s): LIPASE, AMYLASE in the last 168 hours. No results for input(s): AMMONIA in the last 168 hours. Coagulation Profile: No results  for input(s): INR, PROTIME in the last 168 hours. Cardiac Enzymes: No results for input(s): CKTOTAL, CKMB, CKMBINDEX, TROPONINI in the last 168 hours. BNP (last 3 results) No results for input(s): PROBNP in the last 8760 hours. HbA1C: No results for input(s): HGBA1C in the last 72 hours. CBG: Recent Labs  Lab 02/02/24 1026 02/02/24 1414 02/02/24 1726 02/02/24 2208 02/03/24 0940  GLUCAP 106* 124* 110* 81 108*   Lipid Profile: No results for input(s): CHOL, HDL, LDLCALC, TRIG, CHOLHDL, LDLDIRECT in the last 72 hours. Thyroid Function Tests: No results for input(s): TSH, T4TOTAL, FREET4, T3FREE, THYROIDAB in the last 72 hours. Anemia Panel: No results for input(s): VITAMINB12, FOLATE, FERRITIN, TIBC, IRON, RETICCTPCT in the last 72 hours. Sepsis Labs: No results for input(s): PROCALCITON, LATICACIDVEN in the last 168 hours.  No results found for this or any previous visit (from the past 240 hours).        Radiology Studies: No results found.       Scheduled Meds:  amLODipine   10 mg Per Tube Daily   artificial tears   Left Eye Q8H   Chlorhexidine  Gluconate Cloth  6 each Topical Daily   ciprofloxacin  1 drop Left Eye Q4H while awake   enoxaparin  (LOVENOX ) injection  40 mg Subcutaneous Daily   famotidine   20 mg Per Tube BID   feeding supplement (OSMOLITE 1.5 CAL)  237 mL Per Tube 5 X Daily   feeding supplement (PROSource TF20)  60 mL Per Tube TID   fiber supplement (BANATROL TF)  60 mL Per Tube BID   free water   150 mL Per Tube Q4H   hydrALAZINE   10 mg Per Tube BID   insulin  aspart  0-15 Units Subcutaneous 5 times per day   ipratropium-albuterol   3 mL Nebulization TID   losartan   100 mg Per Tube Daily   metoprolol  tartrate  100 mg Per Tube BID   multivitamin with minerals  1 tablet Per Tube Daily   mouth rinse  15 mL Mouth Rinse 4 times per day   Continuous Infusions:   LOS: 99 days    Time spent: 35  minutes    Maxwell Martorano A Ruhaan Nordahl, MD [facility]   If 7PM-7AM, please contact night-coverage [email or web address]  02/03/2024, 11:10 AM

## 2024-02-04 DIAGNOSIS — J9601 Acute respiratory failure with hypoxia: Secondary | ICD-10-CM

## 2024-02-04 DIAGNOSIS — Z430 Encounter for attention to tracheostomy: Secondary | ICD-10-CM | POA: Diagnosis not present

## 2024-02-04 DIAGNOSIS — I613 Nontraumatic intracerebral hemorrhage in brain stem: Secondary | ICD-10-CM | POA: Diagnosis not present

## 2024-02-04 DIAGNOSIS — I619 Nontraumatic intracerebral hemorrhage, unspecified: Secondary | ICD-10-CM | POA: Diagnosis not present

## 2024-02-04 LAB — CBC
HCT: 40.7 % (ref 39.0–52.0)
Hemoglobin: 12.6 g/dL — ABNORMAL LOW (ref 13.0–17.0)
MCH: 28.6 pg (ref 26.0–34.0)
MCHC: 31 g/dL (ref 30.0–36.0)
MCV: 92.5 fL (ref 80.0–100.0)
Platelets: 510 10*3/uL — ABNORMAL HIGH (ref 150–400)
RBC: 4.4 MIL/uL (ref 4.22–5.81)
RDW: 14.6 % (ref 11.5–15.5)
WBC: 9 10*3/uL (ref 4.0–10.5)
nRBC: 0 % (ref 0.0–0.2)

## 2024-02-04 LAB — BASIC METABOLIC PANEL WITH GFR
Anion gap: 11 (ref 5–15)
BUN: 16 mg/dL (ref 6–20)
CO2: 28 mmol/L (ref 22–32)
Calcium: 9.6 mg/dL (ref 8.9–10.3)
Chloride: 101 mmol/L (ref 98–111)
Creatinine, Ser: 0.46 mg/dL — ABNORMAL LOW (ref 0.61–1.24)
GFR, Estimated: >60 mL/min (ref >60)
Glucose, Bld: 122 mg/dL — ABNORMAL HIGH (ref 70–99)
Potassium: 3.8 mmol/L (ref 3.5–5.1)
Sodium: 140 mmol/L (ref 135–145)

## 2024-02-04 LAB — GLUCOSE, CAPILLARY
Glucose-Capillary: 127 mg/dL — ABNORMAL HIGH (ref 70–99)
Glucose-Capillary: 100 mg/dL — ABNORMAL HIGH (ref 70–99)
Glucose-Capillary: 181 mg/dL — ABNORMAL HIGH (ref 70–99)
Glucose-Capillary: 86 mg/dL (ref 70–99)
Glucose-Capillary: 97 mg/dL (ref 70–99)
Glucose-Capillary: 85 mg/dL (ref 70–99)

## 2024-02-04 NOTE — Progress Notes (Signed)
 PROGRESS NOTE    Garrett [name]  XLK:[id number] DOB: [birth year] DOA: 10/27/2023 PCP: Pcp, No   Brief Narrative: 48 year old with no known previous medical history was found down on 10/27/2023, unresponsive with UDS positive for opioids.  He was minimally responsive to Narcan  and he was found to be markedly hypertensive-  blood pressure 262/156.  CT head notable for 4.1 cm intracranial hemorrhage at the pons with intraventricular extension into the fourth ventricle and basal cisterns.  Initially admitted by critical care, and now extubated with tracheostomy, initially placed on 3/20, replaced on 01/05/2024.  PEG tube placed 11/08/2023.   Remains with poor response.  Stable on PEG.  Disposition remains difficult given patient's need for PEG. Currently awaiting disability. Tracheostomy de cannulated 01/29/2024 after RT couldn't pass suction catheter. He has remain stable. Plan to continue to monitor.    Significant hospital events: 3/8: Intubated in ED, pontine bleed/SAH. CT head Acute large hemorrhage 4.1 cm in pons with intraventricular extension without hydrocephalus. 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  Can follow commands with eyes. 3/16 Purposeful with left upper extremity  3/22 tracheostomy placed at bedside, bleeding issues overnight from trach site 3/24 PEG 3/24 Sputum culture with MRSA 4/20: trach was changed to cuffless #8 5/1: Mental status appears to be somewhat improving off narcotics, more awake alert following simple commands(able to grimace, move left toes, track vertically with right eye).  Speech following. Trach downsized to [employer] cuffless #6 XLT 5/4: Modafinil  held, low threshold to resume if mental status/ability to interact diminishes 5/10: PEG dislodged, foley bulb placed and IR consulted to replace tube 5/11: IR replaced PEG tube 5/13 - 5/14: + Fever.  Restarted antibiotics.  Urine culture + E. Coli,  foley catheter changed. 5/17 Tracheostomy changed.   5/18: Completed antibiotics for UTI 5/23: Stable, no significant change, awaiting disability for placement. 6-11 Tracheostomy de cannulated 01/29/2024 after RT couldn't pass suction catheter.    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  CT head with acute large hemorrhage 4.1 cm in the pons, ventricular extension. blood pressure 260/1 50s on admission. Was admitted to the ICU and followed by Neuro.  - 2D echo ejection fraction 60%, A1c 4.7, LDL 96. -He was also treated with 3% hypertonic saline.  Follow-up CT head 3/29 with reduction in size of hemorrhage. -Patient has  PEG in placed.  Speech therapy, PT OT is following. Needs placement.   Acute respiratory failure secondary to large pontine stroke brainstem compression Unable to protect airway/possible aspiration pneumonia - Now has trach collar.   PCCM intermittently following.  Recommended not a candidate for decannulation.  On 6/1 became tachycardic, tachypneic and febrile.  Chest x-ray unremarkable and procalcitonin was negative.  Initially started on vancomycin  and cefepime  later transitioned to vancomycin  as sputum cultures grew MRSA.  -He completed 7 days of vancomycin  on 01/26/2024  -De cannulated 6/11 after RT could pass suction catheter. CCM following, plan to monitor for now, may need tracheostomy in placed in future  per CCM -Bedside fiberoptic bronchoscopy: Revealed ulcerations Celano, tracheostomy distal  multiples attempt made to replaced trach.   - Per CCM if patient developed respiratory distress, inability to maintain his airway or trouble clearing secretions will need to have low threshold to reintubate. Chest x-ray 6/13 negative for PNA Remain stable.   Hypertension: Continue Norvasc , Losartan , Lopressor  Continue with Hydralazine  BID  Transaminases: Improved.  CT abdomen pelvis negative, acute hepatitis panel negative. LFT: stable. AST 58, ALT 210   E. coli UTI  Completed antibiotics course 5/18 Foley catheter was changed 5/13. 6/12.  Needs to exchange foley 7/12   Moderate to severe protein caloric malnutrition, POA Peg in place, TF per RD.    PEG tube malfunction, resolved PEG dislodged 5/10, replaced on 5/11 by IR   Exposure keratopathy, OS Seen by ophthalmology, Dr. Mason Sole on 4/7.  Treated with maxitrol ointment (neo-poly-dex) TID OS for 1 week. -- Refresh PM TID to QID OS for chronic lubrication   AKI with acute urinary retention Resolved.  Has Foley in placed   Macrocytic anemia thrombocytosis  stable/resolved   Perineal skin injury, POA  Continue Gerhard's Butt cream   Relative B12 and folate deficiency Continue replacement with MVI    Goal of care: Currently DNR with intervention.   Nutrition Problem: Inadequate oral intake Etiology: inability to eat   Interventions: Tube feeding, Prostat, MVI, Juven     Nutrition Problem: Inadequate oral intake Etiology: inability to eat    Signs/Symptoms: NPO status    Interventions: Tube feeding, Prostat, MVI, Juven  Estimated body mass index is 31.17 kg/m as calculated from the following:   Height as of this encounter: 5' 8 (1.727 m).   Weight as of this encounter: 93 kg.   DVT prophylaxis: Lovenox  Code Status: DNR Family Communication: Significant other at bedside 6/12.  Disposition Plan:  Status is: Inpatient Remains inpatient appropriate because: awaiting placement    Consultants:  Neurology CCM Ophthalmology       Subjective: Non Verbal, does not follows command.   Objective: Vitals:   02/03/24 2300 02/04/24 0410 02/04/24 0500 02/04/24 1245  BP: (!) 134/93 (!) 138/95  (!) 144/95  Pulse: 87 80  77  Resp: 18 18  18   Temp: 98.4 F (36.9 C) 98.5 F (36.9 C)  98.4 F (36.9 C)  TempSrc: Oral Oral  Axillary  SpO2: 98% 100%  93%  Weight:   93 kg   Height:        Intake/Output Summary (Last 24 hours) at 02/04/2024 1404 Last data filed at 02/04/2024 0800 Gross per 24 hour  Intake 150 ml  Output 2550 ml  Net -2400 ml   Filed Weights   02/02/24 0500 02/03/24 0408 02/04/24 0500  Weight: 93 kg 93 kg 93 kg    Examination:  General exam: NAD Respiratory system: BL ronchus.  Cardiovascular system: S 1, S 2 RRR Gastrointestinal system: BS present, soft, peg tube in place Central nervous system: Eyes open,  Extremities:no edema   Data Reviewed: I have personally reviewed following labs and imaging studies  CBC: Recent Labs  Lab 01/31/24 0806 02/01/24 1118 02/04/24 0922  WBC 12.6* 8.9 9.0  HGB 12.0* 12.1* 12.6*  HCT 39.7 38.9* 40.7  MCV 92.1 92.2 92.5  PLT 596* 542* 510*   Basic Metabolic Panel: Recent Labs  Lab 01/31/24 0806 02/01/24 1118 02/02/24 0531 02/04/24 0922  NA 139 138 140 140  K 4.1 4.1 4.0 3.8  CL 100 99 99 101  CO2 28 28 28 28   GLUCOSE 159* 116* 102* 122*  BUN  21* 21* 23* 16  CREATININE 0.64 0.51* 0.53* 0.46*  CALCIUM 9.5 9.4 9.8 9.6  MG 2.0  --   --   --    GFR: Estimated Creatinine Clearance: 126.3 mL/min (A) (by C-G formula based on SCr of 0.46 mg/dL (L)). Liver Function Tests: Recent Labs  Lab 01/31/24 0806  AST 58*  ALT 210*  ALKPHOS 62  BILITOT 0.4  PROT 7.6  ALBUMIN  3.0*   No results for input(s): LIPASE, AMYLASE in the last 168 hours. No results for input(s): AMMONIA in the last 168 hours. Coagulation Profile: No results for input(s): INR, PROTIME in the last 168 hours. Cardiac Enzymes: No results for input(s):  CKTOTAL, CKMB, CKMBINDEX, TROPONINI in the last 168 hours. BNP (last 3 results) No results for input(s): PROBNP in the last 8760 hours. HbA1C: No results for input(s): HGBA1C in the last 72 hours. CBG: Recent Labs  Lab 02/03/24 1235 02/03/24 1718 02/03/24 2109 02/04/24 0630 02/04/24 1018  GLUCAP 158* 78 104* 100* 181*   Lipid Profile: No results for input(s): CHOL, HDL, LDLCALC, TRIG, CHOLHDL, LDLDIRECT in the last 72 hours. Thyroid Function Tests: No results for input(s): TSH, T4TOTAL, FREET4, T3FREE, THYROIDAB in the last 72 hours. Anemia Panel: No results for input(s): VITAMINB12, FOLATE, FERRITIN, TIBC, IRON, RETICCTPCT in the last 72 hours. Sepsis Labs: No results for input(s): PROCALCITON, LATICACIDVEN in the last 168 hours.  No results found for this or any previous visit (from the past 240 hours).        Radiology Studies: No results found.       Scheduled Meds:  amLODipine   10 mg Per Tube Daily   artificial tears   Left Eye Q8H   Chlorhexidine  Gluconate Cloth  6 each Topical Daily   ciprofloxacin  1 drop Left Eye Q4H while awake   enoxaparin  (LOVENOX ) injection  40 mg Subcutaneous Daily   famotidine   20 mg Per Tube BID   feeding supplement (OSMOLITE 1.5 CAL)  237 mL Per Tube 5 X Daily   feeding supplement (PROSource TF20)  60 mL Per Tube TID   fiber supplement (BANATROL TF)  60 mL Per Tube BID   free water   150 mL Per Tube Q4H   hydrALAZINE   10 mg Per Tube BID   insulin  aspart  0-15 Units Subcutaneous 5 times per day   losartan   100 mg Per Tube Daily   metoprolol  tartrate  100 mg Per Tube BID   multivitamin with minerals  1 tablet Per Tube Daily   mouth rinse  15 mL Mouth Rinse 4 times per day   Continuous Infusions:   LOS: 100 days    Time spent: 35 minutes    Myrene Bougher A Unnamed Zeien, MD [facility]   If 7PM-7AM, please contact night-coverage [email or web address]  02/04/2024, 2:04 PM

## 2024-02-04 NOTE — Progress Notes (Signed)
 NAME:  Garrett [name], MRN:  [id number], DOB:  02/29/1976, LOS: 100 ADMISSION DATE:  10/27/2023, CONSULTATION DATE:  10/27/2023 REFERRING MD:  Florentino Hurdle, DO CHIEF COMPLAINT:  Found Down  History of Present Illness:  48 y/o male with past medical history of hypertension who was found down for unknown downtime by his fiance when she came home from work. Reportedly, having agonal breathing.  He apparently had driven her to work this morning.  He has HTN but never checks his BP and does not follow with MD.  She says you can tell when his BP is really high; his eyes get blood shot and he is sweating.  No h/o seizures.  Besides almost daily [name] and cigarette smoking, she is not aware of any other drugs. Drug screen positive for opioids and he was given several doses of Narcan . Patient found to have:  1. 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 Basal cisterns and fourth ventricle are effaced without hydrocephalus at this time. 2. Trace additional subarachnoid hemorrhage along the left parietal convexity. BP in ED 262/156. His CXR revealed a RUL and perihilar infiltrates suggesting aspiration pneumonia. ED labs also revealed PH 7.169, LA 4.4, CPK 985. Patient was intubated in ED.  Pertinent  Medical History  hypertension  Significant Hospital Events: Including procedures, antibiotic start and stop dates in addition to other pertinent events   3/8: Intubated in ED, pontine bleed/SAH. CT head 17:09 Acute large hemorrhage 4.1 cm in pons with intraventricular extension without hydrocephalus 3/9: CTA 03:14 AM No change in IPH in pons, similar biparietal [facility] 3/14: Now awake, appears locked in can follow commands with eyes. 3/16 Purposeful with left upper extremity  3/22 tracheostomy placed at bedside, bleeding issues overnight from trach site 3/24 PEG (Dr. Hildy Lowers) 3/24  sputum culture with MRSA 3/27 vomited. TF on hold. Abd film c/w ileus. Added reglan , got SSE. Did tolerate PSV all day  3/28 BMs x2 after SSE day prior. Added back TFs at 1/2 rated. Tolerated PS 3/29 Tolerated PS  3/31 Tolerating CPAP PS 15/5, TF on hold due to ileus. 4/1: con't to hold tube feeds today, restarting vancomycin  and sending tracheal aspirate as peaks are 37, plat 24, driving 19. Thick secretions. Fever overnight.  4/2: peaks improved. Ongoing hiccups. Trickle feeding. Neuro exam unchanged.  4/20: trach was changed to cuffless #8 4/28: not safe to swallow. Limited communication and he follows simple commands. On TF 5/1: on TC 28%, with large secretions. Working with speech and he is improving to initiate PMV trials under ST supervision   5/5: on TC 30%, 7 L/min. Trach #6 cuffless, changed 5/1 to facilitate PMV trials  5/12: on TC 21%, 6 L/min. Trach #6 cuffless, unable to produce sounds or speak on PMV. No significant resp secretions. On PEG TF 01/29/2024 tracheostomy was removed due to failure to well provide adequate airway  Interim History / Subjective:  No acute events today. Per RN, respiratory status has been stable.   Objective   Blood pressure (!) 144/95, pulse 77, temperature 98.4 F (36.9 C), temperature source Axillary, resp. rate 18, height 5' 8 (1.727 m), weight 93 kg, SpO2 93%.        Intake/Output Summary (Last 24 hours) at 02/04/2024 1412 Last data filed at 02/04/2024 0800 Gross per 24 hour  Intake 150 ml  Output 2550 ml  Net -2400 ml   [employer]  02/02/24 0500 02/03/24 0408 02/04/24 0500  Weight: 93 kg 93 kg 93 kg   Physical Exam: Chronically ill appearing man lying in bed sleeping [place]/AT, cataract left eye, dysconjugate gaze Breathing comfortably on RA saturating 98%, CTA S1S2, RRR Abd soft, NT. PEG in place without surrounding erythema No peripheral edema Foley with clear urine Opens eyes but no tracking or response to commands. No apparent  comprehension.   Labs reviewed.    Assessment & Plan:  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  Acute hypoxic respiratory failure s/p tracheostomy  -Today he again remains stable without a tracheostomy. Long-term he is at risk for complications of his encephalopathy and having risk for aspiration and future pneumonias, which a tracheostomy will not prevent.  -At this time recommend against reintubation or repeat tracheostomy unless he decompensates from a respiratory standpoint.  -If he develops respiratory distress, inability to maintain his airway, or trouble clearing secretions, I would have a low threshold to reintubate. At that point we would consider elective repeat tracheostomy.  Please call if respiratory decompensation develops.  At this time PCCM will sign off; please call with questions.   Joesph Mussel, DO 02/04/24 2:16 PM  Pulmonary & Critical Care  For contact information, see Amion. If no response to pager, please call PCCM consult pager. After hours, 7PM- 7AM, please call Elink.

## 2024-02-04 NOTE — Plan of Care (Signed)
 Problem: Health Behavior/Discharge Planning: Goal: Goals will be collaboratively established with patient/family Outcome: Progressing   Problem: Nutrition: Goal: Dietary intake will improve Outcome: Progressing Note: PEG bolus feeds.   Problem: Coping: Goal: Ability to adjust to condition or change in health will improve Outcome: Progressing   Problem: Fluid Volume: Goal: Ability to maintain a balanced intake and output will improve Outcome: Progressing   Problem: Metabolic: Goal: Ability to maintain appropriate glucose levels will improve Outcome: Progressing   Problem: Nutritional: Goal: Maintenance of adequate nutrition will improve Outcome: Progressing Goal: Progress toward achieving an optimal weight will improve Outcome: Progressing   Problem: Skin Integrity: Goal: Risk for impaired skin integrity will decrease Outcome: Progressing   Problem: Tissue Perfusion: Goal: Adequacy of tissue perfusion will improve Outcome: Progressing   Problem: Clinical Measurements: Goal: Ability to maintain clinical measurements within normal limits will improve Outcome: Progressing Goal: Will remain free from infection Outcome: Progressing Goal: Diagnostic test results will improve Outcome: Progressing Goal: Respiratory complications will improve Outcome: Progressing Goal: Cardiovascular complication will be avoided Outcome: Progressing   Problem: Activity: Goal: Risk for activity intolerance will decrease Outcome: Progressing   Problem: Nutrition: Goal: Adequate nutrition will be maintained Outcome: Progressing   Problem: Coping: Goal: Level of anxiety will decrease Outcome: Progressing   Problem: Elimination: Goal: Will not experience complications related to bowel motility Outcome: Progressing Goal: Will not experience complications related to urinary retention Outcome: Progressing   Problem: Pain Managment: Goal: General experience of comfort will improve  and/or be controlled Outcome: Progressing   Problem: Safety: Goal: Ability to remain free from injury will improve Outcome: Progressing   Problem: Skin Integrity: Goal: Risk for impaired skin integrity will decrease Outcome: Progressing   Problem: Education: Goal: 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 Outcome: Progressing   Problem: Activity: Goal: Ability to tolerate increased activity will improve Outcome: Progressing   Problem: Respiratory: Goal: Patent airway maintenance will improve Outcome: Progressing   Problem: Education: Goal: Knowledge of disease or condition will improve Outcome: Not Progressing Goal: Knowledge of secondary prevention will improve (MUST DOCUMENT ALL) Outcome: Not Progressing Goal: Knowledge of patient specific risk factors will improve (DELETE if not current risk factor) Outcome: Not Progressing   Problem: Intracerebral Hemorrhage Tissue Perfusion: Goal: Complications of Intracerebral Hemorrhage will be minimized Outcome: Not Progressing   Problem: Coping: Goal: Will verbalize positive feelings about self Outcome: Not Progressing Goal: Will identify appropriate support needs Outcome: Not Progressing   Problem: Health Behavior/Discharge Planning: Goal: Ability to manage health-related needs will improve Outcome: Not Progressing   Problem: Self-Care: Goal: Ability to participate in self-care as condition permits will improve Outcome: Not Progressing Goal: Verbalization of feelings and concerns over difficulty with self-care will improve Outcome: Not Progressing Goal: Ability to communicate needs accurately will improve Outcome: Not Progressing   Problem: Nutrition: Goal: Risk of aspiration will decrease Outcome: Not Progressing   Problem: Education: Goal: Ability to describe self-care measures that may prevent or decrease complications (Diabetes Survival Skills Education) will improve Outcome: Not Progressing    Problem: Health Behavior/Discharge Planning: Goal: Ability to identify and utilize available resources and services will improve Outcome: Not Progressing Goal: Ability to manage health-related needs will improve Outcome: Not Progressing   Problem: Education: Goal: Knowledge of General Education information will improve Description: Including pain rating scale, medication(s)/side effects and non-pharmacologic comfort measures Outcome: Not Progressing   Problem: Health Behavior/Discharge Planning: Goal: Ability to manage health-related needs will improve Outcome: Not Progressing   Problem: Role Relationship: Goal:  Ability to communicate will improve Outcome: Not Progressing

## 2024-02-04 NOTE — Plan of Care (Signed)
  Problem: Education: Goal: Knowledge of disease or condition will improve Outcome: Not Progressing   

## 2024-02-05 DIAGNOSIS — I619 Nontraumatic intracerebral hemorrhage, unspecified: Secondary | ICD-10-CM | POA: Diagnosis not present

## 2024-02-05 LAB — GLUCOSE, CAPILLARY
Glucose-Capillary: 97 mg/dL (ref 70–99)
Glucose-Capillary: 130 mg/dL — ABNORMAL HIGH (ref 70–99)
Glucose-Capillary: 119 mg/dL — ABNORMAL HIGH (ref 70–99)
Glucose-Capillary: 103 mg/dL — ABNORMAL HIGH (ref 70–99)
Glucose-Capillary: 104 mg/dL — ABNORMAL HIGH (ref 70–99)

## 2024-02-05 LAB — BASIC METABOLIC PANEL WITH GFR
Anion gap: 11 (ref 5–15)
BUN: 21 mg/dL — ABNORMAL HIGH (ref 6–20)
CO2: 27 mmol/L (ref 22–32)
Calcium: 9.3 mg/dL (ref 8.9–10.3)
Chloride: 100 mmol/L (ref 98–111)
Creatinine, Ser: 0.52 mg/dL — ABNORMAL LOW (ref 0.61–1.24)
GFR, Estimated: >60 mL/min (ref >60)
Glucose, Bld: 105 mg/dL — ABNORMAL HIGH (ref 70–99)
Potassium: 4.2 mmol/L (ref 3.5–5.1)
Sodium: 138 mmol/L (ref 135–145)

## 2024-02-05 NOTE — Plan of Care (Signed)
  Problem: Nutrition: Goal: Risk of aspiration will decrease Outcome: Progressing Goal: Dietary intake will improve Outcome: Progressing   Problem: Skin Integrity: Goal: Risk for impaired skin integrity will decrease Outcome: Progressing   Problem: Clinical Measurements: Goal: Ability to maintain clinical measurements within normal limits will improve Outcome: Progressing Goal: Will remain free from infection Outcome: Progressing Goal: Diagnostic test results will improve Outcome: Progressing Goal: Respiratory complications will improve Outcome: Progressing Goal: Cardiovascular complication will be avoided Outcome: Progressing   Problem: Elimination: Goal: Will not experience complications related to bowel motility Outcome: Progressing Goal: Will not experience complications related to urinary retention Outcome: Progressing   Problem: Safety: Goal: Ability to remain free from injury will improve Outcome: Progressing   Problem: Skin Integrity: Goal: Risk for impaired skin integrity will decrease Outcome: Progressing

## 2024-02-05 NOTE — Progress Notes (Signed)
 PROGRESS NOTE    Garrett [name]  JXB:[id number] DOB: 1975-10-17 DOA: 10/27/2023 PCP: Pcp, No   Brief Narrative: 48 year old with no known previous medical history was found down on 10/27/2023, unresponsive with UDS positive for opioids.  He was minimally responsive to Narcan  and he was found to be markedly hypertensive-  blood pressure 262/156.  CT head notable for 4.1 cm intracranial hemorrhage at the pons with intraventricular extension into the fourth ventricle and basal cisterns.  Initially admitted by critical care, and now extubated with tracheostomy, initially placed on 3/20, replaced on 01/05/2024.  PEG tube placed 11/08/2023.   Remains with poor response.  Stable on PEG.  Disposition remains difficult given patient's need for PEG. Currently awaiting disability. Tracheostomy de cannulated 01/29/2024 after RT couldn't pass suction catheter. He has remain stable. Plan to continue to monitor. Respiratory status stable.    Significant hospital events: 3/8: Intubated in ED, pontine bleed/SAH. CT head Acute large hemorrhage 4.1 cm in pons with intraventricular extension without hydrocephalus. 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  Can follow commands with eyes. 3/16 Purposeful with left upper extremity  3/22 tracheostomy placed at bedside, bleeding issues overnight from trach site 3/24 PEG 3/24 Sputum culture with MRSA 4/20: trach was changed to cuffless #8 5/1: Mental status appears to be somewhat improving off narcotics, more awake alert following simple commands(able to grimace, move left toes, track vertically with right eye).  Speech following. Trach downsized to [employer] cuffless #6 XLT 5/4: Modafinil  held, low threshold to resume if mental status/ability to interact diminishes 5/10: PEG dislodged, foley bulb placed and IR consulted to replace tube 5/11: IR replaced PEG tube 5/13 - 5/14: + Fever.  Restarted antibiotics.   Urine culture + E. Coli, foley catheter changed. 5/17 Tracheostomy changed.   5/18: Completed antibiotics for UTI 5/23: Stable, no significant change, awaiting disability for placement. 6-11 Tracheostomy de cannulated 01/29/2024 after RT couldn't pass suction catheter.    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  CT head with acute large hemorrhage 4.1 cm in the pons, ventricular extension. blood pressure 260/1 50s on admission. Was admitted to the ICU and followed by Neuro.  - 2D echo ejection fraction 60%, A1c 4.7, LDL 96. -He was also treated with 3% hypertonic saline.  Follow-up CT head 3/29 with reduction in size of hemorrhage. -Patient has  PEG in placed.  Speech therapy, PT OT is following. Needs placement. No responsive, track some times.   Acute respiratory failure secondary to large pontine stroke brainstem compression Unable to protect airway/possible aspiration pneumonia - Now has trach collar.   PCCM intermittently following.  Recommended not a candidate for decannulation.  On 6/1 became tachycardic, tachypneic and febrile.  Chest x-ray unremarkable and procalcitonin was negative.  Initially started on vancomycin  and cefepime  later transitioned to vancomycin  as sputum cultures grew MRSA.  -He completed 7 days of vancomycin  on 01/26/2024  -De cannulated 6/11 after RT could pass suction catheter. CCM following, plan to monitor for now, may need  tracheostomy in placed in future  per CCM -Bedside fiberoptic  bronchoscopy: Revealed ulcerations [place], tracheostomy distal multiples attempt made to replaced trach.  - Per CCM if patient developed respiratory distress, inability to maintain his airway or trouble clearing secretions will need to have low threshold to reintubate. Chest x-ray 6/13 negative for PNA Remain stable since 6/11  Hypertension: Continue Norvasc , Losartan , Lopressor  Continue with Hydralazine  BID  Transaminases: Improved.  CT abdomen pelvis negative, acute hepatitis panel negative. LFT: stable. AST 58, ALT 210   E. coli UTI  Completed antibiotics course 5/18 Foley catheter was changed 5/13. 6/12.  Needs to exchange foley 7/12   Moderate to severe protein caloric malnutrition, POA Peg in place, TF per RD.    PEG tube malfunction, resolved PEG dislodged 5/10, replaced on 5/11 by IR   Exposure keratopathy, OS Seen by ophthalmology, Dr. Mason Sole on 4/7.  Treated with maxitrol ointment (neo-poly-dex) TID OS for 1 week. -- Refresh PM TID to QID OS for chronic lubrication   AKI with acute urinary retention Resolved.  Has Foley in placed   Macrocytic anemia thrombocytosis  stable/resolved   Perineal skin injury, POA  Continue Gerhard's Butt cream   Relative B12 and folate deficiency Continue replacement with MVI    Goal of care: Currently DNR with intervention.   Nutrition Problem: Inadequate oral intake Etiology: inability to eat   Interventions: Tube feeding, Prostat, MVI, Juven     Nutrition Problem: Inadequate oral intake Etiology: inability to eat    Signs/Symptoms: NPO status    Interventions: Tube feeding, Prostat, MVI, Juven  Estimated body mass index is 31.17 kg/m as calculated from the following:   Height as of this encounter: 5' 8 (1.727 m).   Weight as of this encounter: 93 kg.   DVT prophylaxis: Lovenox  Code Status: DNR Family Communication: Significant other at bedside 6/12.  Disposition Plan:  Status is: Inpatient Remains  inpatient appropriate because: awaiting placement    Consultants:  Neurology CCM Ophthalmology      Subjective: Non verbal, not following command.   Objective: Vitals:   02/04/24 2012 02/05/24 0024 02/05/24 0429 02/05/24 0721  BP: (!) 133/91 (!) 145/105 (!) 155/108 (!) 158/96  Pulse: 96 81 85 84  Resp: 18 18 19 19   Temp: 98.8 F (37.1 C) 98 F (36.7 C) 98.3 F (36.8 C) 98.6 F (37 C)  TempSrc: Oral  Oral Axillary  SpO2: 98% 100% 97% 99%  Weight:      Height:        Intake/Output Summary (Last 24 hours) at 02/05/2024 1421 Last data filed at 02/05/2024 1328 Gross per 24 hour  Intake 3324 ml  Output 2250 ml  Net 1074 ml   Filed Weights   02/02/24 0500 02/03/24 0408 02/04/24 0500  Weight: 93 kg 93 kg 93 kg    Examination:  General exam: NAD Respiratory system: BL ronchus.  Cardiovascular system: S 1, S 2 RRR Gastrointestinal system: BS present, soft. peg tube in place Central nervous system: eyes open, does not follow commands, at times would squeeze with left hand some times.  Extremities:no edema   Data Reviewed: I have personally reviewed following labs and imaging studies  CBC: Recent Labs  Lab 01/31/24 0806 02/01/24 1118 02/04/24 0922  WBC 12.6* 8.9 9.0  HGB 12.0* 12.1* 12.6*  HCT 39.7 38.9* 40.7  MCV 92.1 92.2 92.5  PLT 596* 542* 510*   Basic Metabolic Panel: Recent Labs  Lab 01/31/24 0806 02/01/24 1118 02/02/24 0531 02/04/24 1610  02/05/24 0455  NA 139 138 140 140 138  K 4.1 4.1 4.0 3.8 4.2  CL 100 99 99 101 100  CO2 28 28 28 28 27   GLUCOSE 159* 116* 102* 122* 105*  BUN 21* 21* 23* 16 21*  CREATININE 0.64 0.51* 0.53* 0.46* 0.52*  CALCIUM 9.5 9.4 9.8 9.6 9.3  MG 2.0  --   --   --   --    GFR: Estimated Creatinine Clearance: 126.3 mL/min (A) (by C-G formula based on SCr of 0.52 mg/dL (L)). Liver Function Tests: Recent Labs  Lab 01/31/24 0806  AST 58*  ALT 210*  ALKPHOS 62  BILITOT 0.4  PROT 7.6  ALBUMIN  3.0*   No results  for input(s): LIPASE, AMYLASE in the last 168 hours. No results for input(s): AMMONIA in the last 168 hours. Coagulation Profile: No results for input(s): INR, PROTIME in the last 168 hours. Cardiac Enzymes: No results for input(s): CKTOTAL, CKMB, CKMBINDEX, TROPONINI in the last 168 hours. BNP (last 3 results) No results for input(s): PROBNP in the last 8760 hours. HbA1C: No results for input(s): HGBA1C in the last 72 hours. CBG: Recent Labs  Lab 02/04/24 1807 02/04/24 2138 02/05/24 0604 02/05/24 0939 02/05/24 1325  GLUCAP 97 85 97 130* 119*   Lipid Profile: No results for input(s): CHOL, HDL, LDLCALC, TRIG, CHOLHDL, LDLDIRECT in the last 72 hours. Thyroid Function Tests: No results for input(s): TSH, T4TOTAL, FREET4, T3FREE, THYROIDAB in the last 72 hours. Anemia Panel: No results for input(s): VITAMINB12, FOLATE, FERRITIN, TIBC, IRON, RETICCTPCT in the last 72 hours. Sepsis Labs: No results for input(s): PROCALCITON, LATICACIDVEN in the last 168 hours.  No results found for this or any previous visit (from the past 240 hours).        Radiology Studies: No results found.       Scheduled Meds:  amLODipine   10 mg Per Tube Daily   artificial tears   Left Eye Q8H   Chlorhexidine  Gluconate Cloth  6 each Topical Daily   ciprofloxacin  1 drop Left Eye Q4H while awake   enoxaparin  (LOVENOX ) injection  40 mg Subcutaneous Daily   famotidine   20 mg Per Tube BID   feeding supplement (OSMOLITE 1.5 CAL)  237 mL Per Tube 5 X Daily   feeding supplement (PROSource TF20)  60 mL Per Tube TID   fiber supplement (BANATROL TF)  60 mL Per Tube BID   free water   150 mL Per Tube Q4H   hydrALAZINE   10 mg Per Tube BID   insulin  aspart  0-15 Units Subcutaneous 5 times per day   losartan   100 mg Per Tube Daily   metoprolol  tartrate  100 mg Per Tube BID   multivitamin with minerals  1 tablet Per Tube Daily   mouth rinse  15  mL Mouth Rinse 4 times per day   Continuous Infusions:   LOS: 101 days    Time spent: 35 minutes    Garrett Jaffee A Nadelyn Enriques, MD [facility]   If 7PM-7AM, please contact night-coverage [email or web address]  02/05/2024, 2:21 PM

## 2024-02-05 NOTE — Progress Notes (Signed)
 Physical Therapy Treatment Patient Details Name: Garrett [name] MRN: [id number] DOB: 27-Jun-1976 Today's Date: 02/05/2024   History of Present Illness Pt is a 48 yo male who was found down 10/27/23 with agonal breathing. 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 Intubated 3/8, cortrak placed 3/14, trach placed 3/22, PEG placed 3/24. PMH: HTN, smoker    PT Comments  Pt tolerated treatment well today. Pt with similar presentation to previous session. Pt able to sit EOB with +2 total A to work on sitting balance and visual tracking. Pt noted with some visual tracking to the R as well as a few head nods today. No change in DC/DME recs at this time. PT will continue to follow.     If plan is discharge home, recommend the following: Two people to help with walking and/or transfers;Two people to help with bathing/dressing/bathroom;Assistance with cooking/housework;Assistance with feeding;Direct supervision/assist for medications management;Direct supervision/assist for financial management;Assist for transportation;Help with stairs or ramp for entrance;Supervision due to cognitive status   Can travel by private vehicle        Equipment Recommendations  [place] lift;Hospital bed;Wheelchair (measurements PT);Wheelchair cushion (measurements PT);BSC/3in1;Other (comment)    Recommendations for Other Services       Precautions / Restrictions Precautions Precautions: Fall Recall of Precautions/Restrictions: Impaired Precaution/Restrictions Comments: PEG, abdominal binder, SBP < 160 Required Braces or Orthoses: Other Brace Splint/Cast: Bil resting hand splints Restrictions Weight Bearing Restrictions Per Provider Order: No     Mobility  Bed Mobility Overal bed mobility: Needs Assistance Bed Mobility: Supine to Sit, Sit to Supine      Supine to sit: Total assist, +2 for physical assistance Sit to supine: Total assist, +2 for physical assistance   General bed mobility comments: +2 total via helicopter method    Transfers                        Ambulation/Gait                   Stairs             Wheelchair Mobility     Tilt Bed    Modified Rankin (Stroke Patients Only)       Balance Overall balance assessment: Needs assistance Sitting-balance support: Feet supported, No upper extremity supported Sitting balance-Leahy Scale: Zero                                      Communication Communication Communication: Impaired Factors Affecting Communication: Difficulty expressing self  Cognition Arousal: Alert Behavior During Therapy: Flat affect   PT - Cognitive impairments: Difficult to assess Difficult to assess due to: Impaired communication                     PT - Cognition Comments: Pt able to track with eyes slightly and able to nod yes a few times. Following commands: Impaired Following commands impaired: Follows one step commands inconsistently, Follows one step commands with increased time    Cueing Cueing Techniques: Verbal cues, Visual cues  Exercises Other Exercises Other Exercises: Work on visual tracking while sitting EOB. Other Exercises: PROM on BLE in sitting.    General Comments General comments (skin integrity, edema, etc.): VSS  Pertinent Vitals/Pain Pain Assessment Pain Assessment: Faces Faces Pain Scale: No hurt    Home Living                          Prior Function            PT Goals (current goals can now be found in the care plan section) Acute Rehab PT Goals Patient Stated Goal: unable to state Progress towards PT goals: Progressing toward goals    Frequency    Min 2X/week      PT Plan      Co-evaluation              AM-PAC PT 6 Clicks Mobility   Outcome Measure  Help  needed turning from your back to your side while in a flat bed without using bedrails?: Total Help needed moving from lying on your back to sitting on the side of a flat bed without using bedrails?: Total Help needed moving to and from a bed to a chair (including a wheelchair)?: Total Help needed standing up from a chair using your arms (e.g., wheelchair or bedside chair)?: Total Help needed to walk in hospital room?: Total Help needed climbing 3-5 steps with a railing? : Total 6 Click Score: 6    End of Session   Activity Tolerance: Patient tolerated treatment well Patient left: in bed;with call bell/phone within reach Nurse Communication: Need for lift equipment PT Visit Diagnosis: Muscle weakness (generalized) (M62.81);Other symptoms and signs involving the nervous system (R29.898);Hemiplegia and hemiparesis Hemiplegia - Right/Left: Right Hemiplegia - dominant/non-dominant: Dominant Hemiplegia - caused by: Nontraumatic intracerebral hemorrhage     Time: 1610-9604 PT Time Calculation (min) (ACUTE ONLY): 12 min  Charges:    $Therapeutic Activity: 8-22 mins PT General Charges $$ ACUTE PT VISIT: 1 Visit                     Rodgers Clack, PT, DPT Acute Rehab Services [phone]    Garrett [name] 02/05/2024, 3:55 PM

## 2024-02-06 DIAGNOSIS — Z789 Other specified health status: Secondary | ICD-10-CM

## 2024-02-06 DIAGNOSIS — Z7189 Other specified counseling: Secondary | ICD-10-CM | POA: Diagnosis not present

## 2024-02-06 DIAGNOSIS — I629 Nontraumatic intracranial hemorrhage, unspecified: Secondary | ICD-10-CM | POA: Diagnosis not present

## 2024-02-06 DIAGNOSIS — I613 Nontraumatic intracerebral hemorrhage in brain stem: Secondary | ICD-10-CM | POA: Diagnosis not present

## 2024-02-06 DIAGNOSIS — J9601 Acute respiratory failure with hypoxia: Secondary | ICD-10-CM | POA: Diagnosis not present

## 2024-02-06 LAB — BASIC METABOLIC PANEL WITH GFR
Anion gap: 17 — ABNORMAL HIGH (ref 5–15)
BUN: 21 mg/dL — ABNORMAL HIGH (ref 6–20)
CO2: 24 mmol/L (ref 22–32)
Calcium: 9.8 mg/dL (ref 8.9–10.3)
Chloride: 98 mmol/L (ref 98–111)
Creatinine, Ser: 0.55 mg/dL — ABNORMAL LOW (ref 0.61–1.24)
GFR, Estimated: >60 mL/min (ref >60)
Glucose, Bld: 87 mg/dL (ref 70–99)
Potassium: 4.1 mmol/L (ref 3.5–5.1)
Sodium: 139 mmol/L (ref 135–145)

## 2024-02-06 LAB — GLUCOSE, CAPILLARY
Glucose-Capillary: 111 mg/dL — ABNORMAL HIGH (ref 70–99)
Glucose-Capillary: 108 mg/dL — ABNORMAL HIGH (ref 70–99)
Glucose-Capillary: 145 mg/dL — ABNORMAL HIGH (ref 70–99)
Glucose-Capillary: 111 mg/dL — ABNORMAL HIGH (ref 70–99)
Glucose-Capillary: 114 mg/dL — ABNORMAL HIGH (ref 70–99)

## 2024-02-06 LAB — CBC
HCT: 41.3 % (ref 39.0–52.0)
Hemoglobin: 12.6 g/dL — ABNORMAL LOW (ref 13.0–17.0)
MCH: 28.1 pg (ref 26.0–34.0)
MCHC: 30.5 g/dL (ref 30.0–36.0)
MCV: 92.2 fL (ref 80.0–100.0)
Platelets: 535 10*3/uL — ABNORMAL HIGH (ref 150–400)
RBC: 4.48 MIL/uL (ref 4.22–5.81)
RDW: 15.1 % (ref 11.5–15.5)
WBC: 9.3 10*3/uL (ref 4.0–10.5)
nRBC: 0 % (ref 0.0–0.2)

## 2024-02-06 NOTE — Plan of Care (Signed)
  Problem: Coping: Goal: Will identify appropriate support needs Outcome: Progressing   Problem: Nutrition: Goal: Risk of aspiration will decrease Outcome: Progressing   Problem: Metabolic: Goal: Ability to maintain appropriate glucose levels will improve Outcome: Progressing   Problem: Nutritional: Goal: Maintenance of adequate nutrition will improve Outcome: Progressing   Problem: Skin Integrity: Goal: Risk for impaired skin integrity will decrease Outcome: Progressing   Problem: Clinical Measurements: Goal: Respiratory complications will improve Outcome: Progressing Goal: Cardiovascular complication will be avoided Outcome: Progressing

## 2024-02-06 NOTE — Progress Notes (Signed)
 Progress Note    Garrett [name]   UEA:[id number]  DOB: [birth year]  DOA: 10/27/2023     102 PCP: Pcp, No  Initial CC: found unresponsive   Hospital Course: 48 year old with no known previous medical history was found down on 10/27/2023, unresponsive with UDS positive for opioids.  He was minimally responsive to Narcan  and he was found to be markedly hypertensive-  blood pressure 262/156.  CT head notable for 4.1 cm intracranial hemorrhage at the pons with intraventricular extension into the fourth ventricle and basal cisterns.  Initially admitted by critical care, and then extubated with tracheostomy, initially placed on 3/20, replaced on 01/05/2024.  PEG tube placed 11/08/2023.   Remains with poor response and poor prognosis at this time Now decannulated due to difficulty with passing suction catheter and being monitored for any decompensation.  PEG in place.    Significant hospital events: 3/8: Intubated in ED, pontine bleed/SAH. CT head Acute large hemorrhage 4.1 cm in pons with intraventricular extension without hydrocephalus. 3/9: CTA No change in IPH in pons, similar biparietal [facility] 3/14: Now awake, appears locked in syndrome.  Can follow commands with eyes. 3/16 Purposeful with left upper extremity  3/22 tracheostomy placed at bedside, bleeding issues overnight from trach site 3/24 PEG 3/24 Sputum culture with MRSA 4/20: trach was changed to cuffless #8 5/1: Mental status appears to be somewhat improving off narcotics, more awake alert following simple commands(able to grimace, move left toes, track vertically with right eye).  Speech following. Trach downsized to [employer] cuffless #6 XLT 5/4: Modafinil  held, low threshold to resume if mental status/ability to interact diminishes 5/10: PEG dislodged, foley bulb placed and IR consulted to replace tube 5/11: IR replaced PEG tube 5/13 - 5/14: + Fever.  Restarted antibiotics.  Urine culture + E. Coli, foley catheter changed. 5/17  Tracheostomy changed.   5/18: Completed antibiotics for UTI 5/23: Stable, no significant change, awaiting disability for placement. 6-11 Tracheostomy de cannulated 01/29/2024 after RT couldn't pass suction catheter.      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  CT head with acute large hemorrhage 4.1 cm in the pons, ventricular extension. blood pressure 260/1 48s on admission. Was admitted to the ICU and followed by Neuro.  - 2D echo ejection fraction 60%, A1c 4.7, LDL 96. -He was also treated with 3% hypertonic saline.  Follow-up CT head 3/29 with reduction in size of hemorrhage. - PEG dependent; poor prognosis - need repeat GOC discussions given no meaningful improvement now in 3+ months; POOR PROGNOSIS  Goals of care - discussed with neurology on 6/18; given no major improvement since admission, his prognosis is considered poor at this time; he has no meaningful recovery nor QOL - previously evaluated by PCM early in admission; will ask for re-consult to resume discussions with family  - strongly recommend withdrawal of care and focus on comfort and end of life care; POOR PROGNOSIS  E. coli UTI  Chronic foley Completed antibiotics course 5/18 Foley catheter was changed 5/13. 6/12.  Needs to exchange foley 7/12  Acute respiratory failure secondary to large pontine stroke brainstem compression Unable to protect airway/possible aspiration pneumonia - ABX completed  -De cannulated 6/11 after RT could  pass suction catheter. CCM following, plan to monitor for now, may need tracheostomy in placed in future  per CCM - Per CCM if patient developed respiratory distress, inability to maintain his airway or trouble clearing secretions will need to have low threshold to reintubate. Chest x-ray 6/13 negative for PNA Remain stable since 6/11   Hypertension Continue Norvasc , Losartan , Lopressor  Continue with Hydralazine  BID   Transaminases Improved.  CT abdomen pelvis negative, acute hepatitis panel negative   Moderate to severe protein caloric malnutrition, POA Peg in place, TF per RD.    PEG tube malfunction, resolved PEG dislodged 5/10, replaced on 5/11 by IR   Exposure keratopathy, OS Seen by ophthalmology, Dr. Mason Sole on 4/7.  Treated with maxitrol ointment (neo-poly-dex) TID OS for 1 week. -- Refresh PM TID to QID OS for chronic lubrication   AKI with acute urinary retention Resolved.  Has Foley in placed   Macrocytic anemia thrombocytosis  stable/resolved   Perineal skin injury, POA  Continue Gerhard's Butt cream   Relative B12 and folate deficiency Continue replacement with MVI    Nutrition Problem: Inadequate oral intake Etiology: inability to eat   Interventions: Tube feeding, Prostat, MVI, Juven Nutrition Problem: Inadequate oral intake Etiology: inability to eat Signs/Symptoms: NPO status Interventions: Tube feeding, Prostat, MVI, Juven   Estimated body mass index is 31.17 kg/m as calculated from the following:   Height as of this encounter: 5' 8 (1.727 m).   Weight as of this encounter: 93 kg.  Interval History:  No events overnight. Looks uncomfortable on my exam and his breathing looks almost labored and is inconsistent pattern.  His prognosis is poor at this time and he does not seem to have made any meaningful recovery. TF are his life support currently.    Old records reviewed in assessment of this patient   DVT prophylaxis:  enoxaparin  (LOVENOX )  injection 40 mg Start: 01/17/24 1000 SCDs Start: 10/27/23 2226   Code Status:   Code Status: Do not attempt resuscitation (DNR) PRE-ARREST INTERVENTIONS DESIRED  Mobility Assessment (Last 72 Hours)     Mobility Assessment     Row Name 02/05/24 2000 02/05/24 1500 02/05/24 0905 02/05/24 0609 02/05/24 0439   Does patient have an order for bedrest or is patient medically unstable No - Continue assessment -- No - Continue assessment No - Continue assessment No - Continue assessment   What is the highest level of mobility based on the progressive mobility assessment? Level 1 (Bedfast) - Unable to balance while sitting on edge of bed Level 1 (Bedfast) - Unable to balance while sitting on edge of bed Level 1 (Bedfast) - Unable to balance while sitting on edge of bed Level 1 (Bedfast) - Unable to balance while sitting on edge of bed Level 1 (Bedfast) - Unable to balance while sitting on edge of bed   Is the above level different from baseline mobility prior to current illness? Yes - Recommend PT order -- Yes - Recommend PT order Yes - Recommend PT order Yes - Recommend PT order    Row Name 02/05/24 0311 02/05/24 0032 02/04/24 2145 02/04/24 2000 02/04/24 0746   Does patient have an order for bedrest or is patient medically unstable No - Continue assessment No - Continue assessment No - Continue assessment No - Continue assessment No - Continue assessment  What is the highest level of mobility based on the progressive mobility assessment? Level 1 (Bedfast) - Unable to balance while sitting on edge of bed Level 1 (Bedfast) - Unable to balance while sitting on edge of bed Level 1 (Bedfast) - Unable to balance while sitting on edge of bed Level 1 (Bedfast) - Unable to balance while sitting on edge of bed Level 1 (Bedfast) - Unable to balance while sitting on edge of bed   Is the above level different from baseline mobility prior to current illness? Yes - Recommend PT order Yes - Recommend PT order Yes -  Recommend PT order Yes - Recommend PT order Yes - Recommend PT order    Row Name 02/04/24 0000 02/03/24 2000         Does patient have an order for bedrest or is patient medically unstable No - Continue assessment No - Continue assessment      What is the highest level of mobility based on the progressive mobility assessment? Level 1 (Bedfast) - Unable to balance while sitting on edge of bed Level 1 (Bedfast) - Unable to balance while sitting on edge of bed      Is the above level different from baseline mobility prior to current illness? Yes - Recommend PT order Yes - Recommend PT order         Barriers to discharge: ?? Disposition Plan:  ?? Status is: Inpt  Objective: Blood pressure (!) 130/97, pulse 78, temperature 99 F (37.2 C), temperature source Axillary, resp. rate 17, height 5' 8 (1.727 m), weight 93 kg, SpO2 97%.  Examination:  Physical Exam Constitutional:      Comments: Unresponsive and laying in bed appearing uncomfortable but no distress.  Occasionally opening eyes with no consistent meaningful purpose  HENT:     Head:     Comments: Long hair hanging off the back of the bed    Mouth/Throat:     Mouth: Mucous membranes are dry.   Eyes:     Comments: Intermittent right eye movement at times with no consistent tracking or blink reflex   Cardiovascular:     Rate and Rhythm: Normal rate and regular rhythm.  Pulmonary:     Comments: Abnormal respiratory pattern with almost respiratory pauses Abdominal:     General: There is no distension.     Palpations: Abdomen is soft.   Musculoskeletal:        General: Normal range of motion.   Skin:    General: Skin is warm and dry.   Neurological:     Comments: Does not interact or follow commands.  Occasionally opens eyes with no meaningful purpose that can be repeated consistently     Consultants:  Palliative care  Procedures:    Data Reviewed: Results for orders placed or performed during the hospital  encounter of 10/27/23 (from the past 24 hours)  Glucose, capillary     Status: Abnormal   Collection Time: 02/05/24  5:38 PM  Result Value Ref Range   Glucose-Capillary 103 (H) 70 - 99 mg/dL   Comment 1 Notify RN    Comment 2 Document in Chart   Glucose, capillary     Status: Abnormal   Collection Time: 02/05/24  9:44 PM  Result Value Ref Range   Glucose-Capillary 104 (H) 70 - 99 mg/dL  Glucose, capillary     Status: Abnormal   Collection Time: 02/06/24  4:39 AM  Result Value Ref Range   Glucose-Capillary 111 (H) 70 -  99 mg/dL   Comment 1 Notify RN   CBC     Status: Abnormal   Collection Time: 02/06/24  4:45 AM  Result Value Ref Range   WBC 9.3 4.0 - 10.5 K/uL   RBC 4.48 4.22 - 5.81 MIL/uL   Hemoglobin 12.6 (L) 13.0 - 17.0 g/dL   HCT 35.7 01.7 - 79.3 %   MCV 92.2 80.0 - 100.0 fL   MCH 28.1 26.0 - 34.0 pg   MCHC 30.5 30.0 - 36.0 g/dL   RDW 90.3 00.9 - 23.3 %   Platelets 535 (H) 150 - 400 K/uL   nRBC 0.0 0.0 - 0.2 %  Basic metabolic panel     Status: Abnormal   Collection Time: 02/06/24  4:45 AM  Result Value Ref Range   Sodium 139 135 - 145 mmol/L   Potassium 4.1 3.5 - 5.1 mmol/L   Chloride 98 98 - 111 mmol/L   CO2 24 22 - 32 mmol/L   Glucose, Bld 87 70 - 99 mg/dL   BUN 21 (H) 6 - 20 mg/dL   Creatinine, Ser 0.07 (L) 0.61 - 1.24 mg/dL   Calcium 9.8 8.9 - 62.2 mg/dL   GFR, Estimated >63 >33 mL/min   Anion gap 17 (H) 5 - 15  Glucose, capillary     Status: Abnormal   Collection Time: 02/06/24  9:50 AM  Result Value Ref Range   Glucose-Capillary 108 (H) 70 - 99 mg/dL   Comment 1 Notify RN    Comment 2 Document in Chart   Glucose, capillary     Status: Abnormal   Collection Time: 02/06/24  1:34 PM  Result Value Ref Range   Glucose-Capillary 145 (H) 70 - 99 mg/dL   Comment 1 Notify RN    Comment 2 Document in Chart     I have reviewed pertinent nursing notes, vitals, labs, and images as necessary. I have ordered labwork to follow up on as indicated.  I have reviewed  the last notes from staff over past 24 hours. I have discussed patient's care plan and test results with nursing staff, CM/SW, and other staff as appropriate.  Time spent: Greater than 50% of the 55 minute visit was spent in counseling/coordination of care for the patient as laid out in the A&P.   LOS: 102 days   Faith Homes, MD [facility] 02/06/2024, 2:02 PM

## 2024-02-06 NOTE — Progress Notes (Signed)
 Speech Language Pathology Treatment: Cognitive-Linguistic  Patient Details Name: Garrett [name] MRN: [id number] DOB: March 31, 1976 Today's Date: 02/06/2024 Time: 7829-5621 SLP Time Calculation (min) (ACUTE ONLY): 22 min  Assessment / Plan / Recommendation Clinical Impression  Patient seen by SLP for speech treatment focusing on communication goals. OT present in room as well and working with patient sitting edge of bed. Yes/No communication for basic needs and biographical information elicited through use of vertical eye gaze using yes/no board and squeezing of ball in left hand. Patient was 100% accurate with responses to biographical yes/no questions. He was not able to move tongue, lips or jaw on command but did exhibit spontaneous movement such as when he had teeth clenched on toothette sponge. He exhibited vocalization during a spontaneous sigh but unable to perform a yawn, sigh or vocalization on command. When cued for performing a sigh, he was able to breath in short burst of air fairly consistently. SLP will continue to follow.   HPI HPI: Pt is a 48 yo male who was found down 10/27/23 with agonal breathing. 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 Intubated 3/8, cortrak placed 3/14, trach placed 3/22, PEG placed 3/24. PMH: HTN, smoker      SLP Plan  Continue with current plan of care          Recommendations  Diet recommendations: NPO Medication Administration: Via alternative means                  Oral care QID;Staff/trained caregiver to provide oral care   Frequent or constant Supervision/Assistance Cognitive communication deficit (H08.657)     Continue with current plan of care     Jacqualine Mater, MA, CCC-SLP Speech Therapy

## 2024-02-06 NOTE — Progress Notes (Signed)
 Occupational Therapy Treatment Patient Details Name: Garrett [name] MRN: [id number] DOB: 12-11-1975 Today's Date: 02/06/2024   History of present illness Pt is a 48 yo male who was found down 10/27/23 with agonal breathing. 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 Intubated 3/8, cortrak placed 3/14, trach placed 3/22, PEG placed 3/24. De-cannulated 6/10. PMH: HTN, smoker   OT comments  Pt seen with SLP to promote communication and ADL performance.  Patient requires total assist to sit EOB.  Using L hand to squeeze ball to answer questions, good accuracy with orientation questions but tends to hesitate/unclear on questions of are you comfortable or are you tired.  Remains total assist for ADLs, does show B shoulder movement (minimal) today. VSS on RA.  Will follow.       If plan is discharge home, recommend the following:  Two people to help with walking and/or transfers;Two people to help with bathing/dressing/bathroom;Direct supervision/assist for medications management;Direct supervision/assist for financial management;Assist for transportation;Help with stairs or ramp for entrance   Equipment Recommendations  Wheelchair (measurements OT);Wheelchair cushion (measurements OT);Hospital bed;Hoyer lift    Recommendations for Other Services Speech consult    Precautions / Restrictions Precautions Precautions: Fall Recall of Precautions/Restrictions: Impaired Precaution/Restrictions Comments: PEG, abdominal binder, SBP < 160 Required Braces or Orthoses: Other Brace Splint/Cast: Bil resting hand splints Other Brace: has bilateral prevalon boots Restrictions Weight Bearing Restrictions Per Provider Order: No       Mobility Bed Mobility Overal bed mobility: Needs Assistance Bed Mobility: Supine to Sit, Sit to  Supine Rolling: +2 for physical assistance, Total assist         General bed mobility comments: +2 total via helicopter method    Transfers                   General transfer comment: deferred     Balance Overall balance assessment: Needs assistance Sitting-balance support: Feet supported, No upper extremity supported Sitting balance-Leahy Scale: Zero Sitting balance - Comments: total assist at EOB Postural control: Posterior lean                                 ADL either performed or assessed with clinical judgement   ADL Overall ADL's : Needs assistance/impaired                                       General ADL Comments: total assist    Extremity/Trunk Assessment Upper Extremity Assessment Upper Extremity Assessment: RUE deficits/detail;LUE deficits/detail RUE Deficits / Details: pt able to minimally elevate shoulder when cued. RUE Sensation: decreased proprioception;decreased light touch RUE Coordination: decreased fine motor;decreased gross motor LUE Deficits / Details: pt noted to have have scapula activation with shrug initiated; able to initate forward flexion minimally when asked to place hand on lap, hand activation for grasp when cued to squeeze or hold onto the ball. LUE Sensation: decreased proprioception LUE Coordination: decreased fine motor;decreased gross motor            Vision   Vision Assessment?: Vision impaired- to be further tested in functional context   Perception     Praxis     Communication Communication Communication: Impaired Factors Affecting Communication: Difficulty expressing self  Cognition Arousal: Alert Behavior During Therapy: Flat affect Cognition: Difficult to assess Difficult to assess due to: Impaired communication           OT - Cognition Comments: using yes/no commuincation white board with SLP, vertical eye movement with fair consistency.  Using ball squeeze in L hand to  answer yes/no questions with good accuracy.                 Following commands: Impaired Following commands impaired: Follows one step commands inconsistently, Follows one step commands with increased time      Cueing   Cueing Techniques: Verbal cues, Visual cues  Exercises      Shoulder Instructions       General Comments VSS on RA    Pertinent Vitals/ Pain       Pain Assessment Pain Assessment: Faces Faces Pain Scale: No hurt Pain Intervention(s): Monitored during session  Home Living                                          Prior Functioning/Environment              Frequency  Min 1X/week        Progress Toward Goals  OT Goals(current goals can now be found in the care plan section)  Progress towards OT goals: Progressing toward goals  Acute Rehab OT Goals Patient Stated Goal: unable OT Goal Formulation: Patient unable to participate in goal setting ADL Goals Pt/caregiver will Perform Home Exercise Program: Increased ROM;Both right and left upper extremity Additional ADL Goal #1: Pt will follow 1 step commands with 50% accuracy in a non distracting environment. Additional ADL Goal #2: Pt will complete bed mobility with max assist +2, maintain sitting balance statically for 3 minutes with mod assist. Additional ADL Goal #3: Pt will use ball squeeze in L hand for accurate yes answer on 5/5 attempts.  Plan      Co-evaluation    PT/OT/SLP Co-Evaluation/Treatment: Yes Reason for Co-Treatment: For patient/therapist safety;Necessary to address cognition/behavior during functional activity   OT goals addressed during session: ADL's and self-care SLP goals addressed during session: Communication;Cognition    AM-PAC OT 6 Clicks Daily Activity     Outcome Measure   Help from another person eating meals?: Total Help from another person taking care of personal grooming?: Total Help from another person toileting, which includes  using toliet, bedpan, or urinal?: Total Help from another person bathing (including washing, rinsing, drying)?: Total Help from another person to put on and taking off regular upper body clothing?: Total Help from another person to put on and taking off regular lower body clothing?: Total 6 Click Score: 6    End of Session    OT Visit Diagnosis: Other abnormalities of gait and mobility (R26.89);Muscle weakness (generalized) (M62.81);Other symptoms and signs involving the nervous system (R29.898);Other symptoms and signs involving cognitive function;Hemiplegia and hemiparesis Hemiplegia - caused by: Other Nontraumatic intracranial hemorrhage   Activity Tolerance Patient tolerated treatment well   Patient Left in bed;with call bell/phone within reach;with nursing/sitter in room   Nurse Communication Mobility status;Precautions        Time: 7425-9563 OT Time Calculation (min): 24 min  Charges: OT General Charges $OT Visit: 1 Visit OT Treatments $Therapeutic Activity: 8-22 mins  Bary Boss, OT Acute Rehabilitation Services Office [phone] Secure Chat Preferred    Fredrich Jefferson 02/06/2024,  2:33 PM

## 2024-02-07 DIAGNOSIS — Z7189 Other specified counseling: Secondary | ICD-10-CM | POA: Diagnosis not present

## 2024-02-07 DIAGNOSIS — I629 Nontraumatic intracranial hemorrhage, unspecified: Secondary | ICD-10-CM | POA: Diagnosis not present

## 2024-02-07 DIAGNOSIS — J9601 Acute respiratory failure with hypoxia: Secondary | ICD-10-CM | POA: Diagnosis not present

## 2024-02-07 DIAGNOSIS — I613 Nontraumatic intracerebral hemorrhage in brain stem: Secondary | ICD-10-CM | POA: Diagnosis not present

## 2024-02-07 DIAGNOSIS — Z789 Other specified health status: Secondary | ICD-10-CM | POA: Diagnosis not present

## 2024-02-07 LAB — GLUCOSE, CAPILLARY
Glucose-Capillary: 114 mg/dL — ABNORMAL HIGH (ref 70–99)
Glucose-Capillary: 116 mg/dL — ABNORMAL HIGH (ref 70–99)
Glucose-Capillary: 129 mg/dL — ABNORMAL HIGH (ref 70–99)
Glucose-Capillary: 132 mg/dL — ABNORMAL HIGH (ref 70–99)
Glucose-Capillary: 136 mg/dL — ABNORMAL HIGH (ref 70–99)
Glucose-Capillary: 165 mg/dL — ABNORMAL HIGH (ref 70–99)
Glucose-Capillary: 91 mg/dL (ref 70–99)
Glucose-Capillary: 94 mg/dL (ref 70–99)

## 2024-02-07 NOTE — TOC Progression Note (Signed)
 Transition of Care [facility]) - Progression Note    Patient Details  Name: Trea Latner MRN: [id number] Date of Birth: [birth year]  Transition of Care [facility]) CM/SW Contact  Jonathan Neighbor, RN Phone Number: 02/07/2024, 1:09 PM  Clinical Narrative:     Awaiting palliative consult. TOC following.  Expected Discharge Plan: Long Term Nursing Home Barriers to Discharge: Continued Medical Work up, SNF Pending Medicaid, SNF Pending bed offer, SNF Pending payor source - LOG, Inadequate or no insurance (New trach)  Expected Discharge Plan and Services In-house Referral: Clinical Social Work, Hospice / Palliative Care   Post Acute Care Choice: Skilled Nursing Facility Living arrangements for the past 2 months: Single Family Home                                       Social Determinants of Health (SDOH) Interventions SDOH Screenings   Food Insecurity: Patient Unable To Answer (10/29/2023)  Social Connections: Unknown (06/23/2023)   Received from [facility]  Tobacco Use: High Risk (10/31/2023)    Readmission Risk Interventions     No data to display

## 2024-02-07 NOTE — Progress Notes (Signed)
 Progress Note    Garrett [name]   ZOX:[id number]  DOB: [birth year]  DOA: 10/27/2023     103 PCP: Pcp, No  Initial CC: found unresponsive   Hospital Course: 48 year old with no known previous medical history was found down on 10/27/2023, unresponsive with UDS positive for opioids.  He was minimally responsive to Narcan  and he was found to be markedly hypertensive-  blood pressure 262/156.  CT head notable for 4.1 cm intracranial hemorrhage at the pons with intraventricular extension into the fourth ventricle and basal cisterns.  Initially admitted by critical care, and then extubated with tracheostomy, initially placed on 3/20, replaced on 01/05/2024.  PEG tube placed 11/08/2023.   Remains with poor response and poor prognosis at this time Now decannulated due to difficulty with passing suction catheter and being monitored for any decompensation.  PEG in place.    Significant hospital events: 3/8: Intubated in ED, pontine bleed/SAH. CT head Acute large hemorrhage 4.1 cm in pons with intraventricular extension without hydrocephalus. 3/9: CTA No change in IPH in pons, similar biparietal [facility] 3/14: Now awake, appears locked in syndrome.  Can follow commands with eyes. 3/16 Purposeful with left upper extremity  3/22 tracheostomy placed at bedside, bleeding issues overnight from trach site 3/24 PEG 3/24 Sputum culture with MRSA 4/20: trach was changed to cuffless #8 5/1: Mental status appears to be somewhat improving off narcotics, more awake alert following simple commands(able to grimace, move left toes, track vertically with right eye).  Speech following. Trach downsized to [employer] cuffless #6 XLT 5/4: Modafinil  held, low threshold to resume if mental status/ability to interact diminishes 5/10: PEG dislodged, foley bulb placed and IR consulted to replace tube 5/11: IR replaced PEG tube 5/13 - 5/14: + Fever.  Restarted antibiotics.  Urine culture + E. Coli, foley catheter changed. 5/17  Tracheostomy changed.   5/18: Completed antibiotics for UTI 5/23: Stable, no significant change, awaiting disability for placement. 6-11 Tracheostomy de cannulated 01/29/2024 after RT couldn't pass suction catheter.      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  CT head with acute large hemorrhage 4.1 cm in the pons, ventricular extension. blood pressure 260/1 50s on admission. Was admitted to the ICU and followed by Neuro.  - 2D echo ejection fraction 60%, A1c 4.7, LDL 96. -He was also treated with 3% hypertonic saline.  Follow-up CT head 3/29 with reduction in size of hemorrhage. - PEG dependent; poor prognosis - need repeat GOC discussions given no meaningful improvement now in 3+ months; POOR PROGNOSIS  Goals of care - discussed with neurology on 6/18; given no major improvement since admission, his prognosis is considered poor at this time; he has no meaningful recovery nor QOL - previously evaluated by PCM early in admission; will ask for re-consult to resume discussions with family  - strongly recommend withdrawal of care and focus on comfort and end of life care; POOR PROGNOSIS  E. coli UTI  Chronic foley Completed antibiotics course 5/18 Foley catheter was changed 5/13. 6/12.  Needs to exchange foley 7/12  Acute respiratory failure secondary to large pontine stroke brainstem compression Unable to protect airway/possible aspiration pneumonia - ABX completed  -De cannulated 6/11 after RT could  pass suction catheter. CCM following, plan to monitor for now, may need tracheostomy in placed in future  per CCM - Per CCM if patient developed respiratory distress, inability to maintain his airway or trouble clearing secretions will need to have low threshold to reintubate. Chest x-ray 6/13 negative for PNA Remain stable since 6/11   Hypertension Continue Norvasc , Losartan , Lopressor  Continue with Hydralazine  BID   Transaminases Improved.  CT abdomen pelvis negative, acute hepatitis panel negative   Moderate to severe protein caloric malnutrition, POA Peg in place, TF per RD.    PEG tube malfunction, resolved PEG dislodged 5/10, replaced on 5/11 by IR   Exposure keratopathy, OS Seen by ophthalmology, Dr. Mason Sole on 4/7.  Treated with maxitrol ointment (neo-poly-dex) TID OS for 1 week. -- Refresh PM TID to QID OS for chronic lubrication   AKI with acute urinary retention Resolved.  Has Foley in placed   Macrocytic anemia thrombocytosis  stable/resolved   Perineal skin injury, POA  Continue Gerhard's Butt cream   Relative B12 and folate deficiency Continue replacement with MVI    Nutrition Problem: Inadequate oral intake Etiology: inability to eat   Interventions: Tube feeding, Prostat, MVI, Juven Nutrition Problem: Inadequate oral intake Etiology: inability to eat Signs/Symptoms: NPO status Interventions: Tube feeding, Prostat, MVI, Juven   Estimated body mass index is 31.17 kg/m as calculated from the following:   Height as of this encounter: 5' 8 (1.727 m).   Weight as of this encounter: 93 kg.  Interval History:  No events overnight.  Breathing pattern seemed a little bit better today.  Could not follow any commands or track with either eye.  Still appears uncomfortable in general. No blink reflex.   Old records reviewed in assessment of this patient   DVT prophylaxis:  enoxaparin  (LOVENOX ) injection 40 mg Start: 01/17/24 1000 SCDs Start: 10/27/23  2226   Code Status:   Code Status: Do not attempt resuscitation (DNR) PRE-ARREST INTERVENTIONS DESIRED  Mobility Assessment (Last 72 Hours)     Mobility Assessment     Row Name 02/06/24 2000 02/06/24 0800 02/05/24 2000 02/05/24 1500 02/05/24 0905   Does patient have an order for bedrest or is patient medically unstable No - Continue assessment No - Continue assessment No - Continue assessment -- No - Continue assessment   What is the highest level of mobility based on the progressive mobility assessment? Level 1 (Bedfast) - Unable to balance while sitting on edge of bed Level 1 (Bedfast) - Unable to balance while sitting on edge of bed Level 1 (Bedfast) - Unable to balance while sitting on edge of bed Level 1 (Bedfast) - Unable to balance while sitting on edge of bed Level 1 (Bedfast) - Unable to balance while sitting on edge of bed   Is the above level different from baseline mobility prior to current illness? Yes - Recommend PT order Yes - Recommend PT order Yes - Recommend PT order -- Yes - Recommend PT order    Row Name 02/05/24 0609 02/05/24 0439 02/05/24 0311 02/05/24 0032 02/04/24 2145   Does patient have an order for bedrest or is patient medically unstable No - Continue assessment No - Continue assessment No - Continue assessment No - Continue assessment No - Continue assessment   What is the highest level of mobility based on the progressive mobility  assessment? Level 1 (Bedfast) - Unable to balance while sitting on edge of bed Level 1 (Bedfast) - Unable to balance while sitting on edge of bed Level 1 (Bedfast) - Unable to balance while sitting on edge of bed Level 1 (Bedfast) - Unable to balance while sitting on edge of bed Level 1 (Bedfast) - Unable to balance while sitting on edge of bed   Is the above level different from baseline mobility prior to current illness? Yes - Recommend PT order Yes - Recommend PT order Yes - Recommend PT order Yes - Recommend PT order Yes - Recommend PT  order    Row Name 02/04/24 2000           Does patient have an order for bedrest or is patient medically unstable No - Continue assessment       What is the highest level of mobility based on the progressive mobility assessment? Level 1 (Bedfast) - Unable to balance while sitting on edge of bed       Is the above level different from baseline mobility prior to current illness? Yes - Recommend PT order          Barriers to discharge: ?? Disposition Plan:  ?? Status is: Inpt  Objective: Blood pressure (!) 154/107, pulse (!) 104, temperature 98.4 F (36.9 C), temperature source Oral, resp. rate (!) 26, height 5' 8 (1.727 m), weight 93 kg, SpO2 98%.  Examination:  Physical Exam Constitutional:      Comments: Laying in bed appearing uncomfortable but no distress.  Occasionally opening eyes with no consistent meaningful purpose  HENT:     Head:     Comments: Long hair hanging off the back of the bed    Mouth/Throat:     Mouth: Mucous membranes are dry.   Eyes:     Comments: Intermittent right eye movement at times with no consistent tracking or blink reflex   Cardiovascular:     Rate and Rhythm: Normal rate and regular rhythm.  Pulmonary:     Comments: Abnormal respiratory pattern with almost respiratory pauses Abdominal:     General: There is no distension.     Palpations: Abdomen is soft.   Musculoskeletal:        General: Normal range of motion.   Skin:    General: Skin is warm and dry.   Neurological:     Comments: Does not interact or follow commands.  Occasionally opens eyes with no meaningful purpose that can be repeated consistently.     Consultants:  Palliative care  Procedures:    Data Reviewed: Results for orders placed or performed during the hospital encounter of 10/27/23 (from the past 24 hours)  Glucose, capillary     Status: Abnormal   Collection Time: 02/06/24  5:56 PM  Result Value Ref Range   Glucose-Capillary 111 (H) 70 - 99 mg/dL    Comment 1 Notify RN    Comment 2 Document in Chart   Glucose, capillary     Status: Abnormal   Collection Time: 02/06/24  9:49 PM  Result Value Ref Range   Glucose-Capillary 114 (H) 70 - 99 mg/dL  Glucose, capillary     Status: Abnormal   Collection Time: 02/07/24 12:54 AM  Result Value Ref Range   Glucose-Capillary 129 (H) 70 - 99 mg/dL  Glucose, capillary     Status: Abnormal   Collection Time: 02/07/24  5:15 AM  Result Value Ref Range   Glucose-Capillary 114 (H) 70 - 99  mg/dL  Glucose, capillary     Status: Abnormal   Collection Time: 02/07/24  6:20 AM  Result Value Ref Range   Glucose-Capillary 136 (H) 70 - 99 mg/dL  Glucose, capillary     Status: Abnormal   Collection Time: 02/07/24 11:39 AM  Result Value Ref Range   Glucose-Capillary 116 (H) 70 - 99 mg/dL   Comment 1 Notify RN     I have reviewed pertinent nursing notes, vitals, labs, and images as necessary. I have ordered labwork to follow up on as indicated.  I have reviewed the last notes from staff over past 24 hours. I have discussed patient's care plan and test results with nursing staff, CM/SW, and other staff as appropriate.  Time spent: Greater than 50% of the 55 minute visit was spent in counseling/coordination of care for the patient as laid out in the A&P.   LOS: 103 days   Faith Homes, MD [facility] 02/07/2024, 2:51 PM

## 2024-02-08 DIAGNOSIS — Z7189 Other specified counseling: Secondary | ICD-10-CM | POA: Diagnosis not present

## 2024-02-08 DIAGNOSIS — I613 Nontraumatic intracerebral hemorrhage in brain stem: Secondary | ICD-10-CM | POA: Diagnosis not present

## 2024-02-08 DIAGNOSIS — Z789 Other specified health status: Secondary | ICD-10-CM | POA: Diagnosis not present

## 2024-02-08 LAB — GLUCOSE, CAPILLARY
Glucose-Capillary: 115 mg/dL — ABNORMAL HIGH (ref 70–99)
Glucose-Capillary: 118 mg/dL — ABNORMAL HIGH (ref 70–99)
Glucose-Capillary: 136 mg/dL — ABNORMAL HIGH (ref 70–99)
Glucose-Capillary: 110 mg/dL — ABNORMAL HIGH (ref 70–99)
Glucose-Capillary: 160 mg/dL — ABNORMAL HIGH (ref 70–99)
Glucose-Capillary: 132 mg/dL — ABNORMAL HIGH (ref 70–99)
Glucose-Capillary: 92 mg/dL (ref 70–99)

## 2024-02-08 NOTE — Progress Notes (Signed)
 Progress Note    Garrett [name]   WUJ:[id number]  DOB: 07-21-1976  DOA: 10/27/2023     104 PCP: Pcp, No  Initial CC: found unresponsive   Hospital Course: 48 year old with no known previous medical history was found down on 10/27/2023, unresponsive with UDS positive for opioids.  He was minimally responsive to Narcan  and he was found to be markedly hypertensive-  blood pressure 262/156.  CT head notable for 4.1 cm intracranial hemorrhage at the pons with intraventricular extension into the fourth ventricle and basal cisterns.  Initially admitted by critical care, and then extubated with tracheostomy, initially placed on 3/20, replaced on 01/05/2024.  PEG tube placed 11/08/2023.   Remains with poor response and poor prognosis at this time Now decannulated due to difficulty with passing suction catheter and being monitored for any decompensation.  PEG in place.    Significant hospital events: 3/8: Intubated in ED, pontine bleed/SAH. CT head Acute large hemorrhage 4.1 cm in pons with intraventricular extension without hydrocephalus. 3/9: CTA No change in IPH in pons, similar biparietal [facility] 3/14: Now awake, appears locked in syndrome.  Can follow commands with eyes. 3/16 Purposeful with left upper extremity  3/22 tracheostomy placed at bedside, bleeding issues overnight from trach site 3/24 PEG 3/24 Sputum culture with MRSA 4/20: trach was changed to cuffless #8 5/1: Mental status appears to be somewhat improving off narcotics, more awake alert following simple commands(able to grimace, move left toes, track vertically with right eye).  Speech following. Trach downsized to [employer] cuffless #6 XLT 5/4: Modafinil  held, low threshold to resume if mental status/ability to interact diminishes 5/10: PEG dislodged, foley bulb placed and IR consulted to replace tube 5/11: IR replaced PEG tube 5/13 - 5/14: + Fever.  Restarted antibiotics.  Urine culture + E. Coli, foley catheter changed. 5/17  Tracheostomy changed.   5/18: Completed antibiotics for UTI 5/23: Stable, no significant change, awaiting disability for placement. 6-11 Tracheostomy de cannulated 01/29/2024 after RT couldn't pass suction catheter.      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  CT head with acute large hemorrhage 4.1 cm in the pons, ventricular extension. blood pressure 260/1 50s on admission. Was admitted to the ICU and followed by Neuro.  - 2D echo ejection fraction 60%, A1c 4.7, LDL 96. -He was also treated with 3% hypertonic saline.  Follow-up CT head 3/29 with reduction in size of hemorrhage. - PEG dependent; poor prognosis - need repeat GOC discussions given no meaningful improvement now in 3+ months; POOR PROGNOSIS  Goals of care - discussed with neurology on 6/18; given no major improvement since admission, his prognosis is considered poor at this time; he has no meaningful recovery nor QOL - previously evaluated by PCM early in admission; will ask for re-consult to resume discussions with family  - strongly recommend withdrawal of care and focus on comfort and end of life care; POOR PROGNOSIS  E. coli UTI  Chronic foley Completed antibiotics course 5/18 Foley catheter was changed 5/13. 6/12.  Needs to exchange foley 7/12  Acute respiratory failure secondary to large pontine stroke brainstem compression Unable to protect airway/possible aspiration pneumonia - ABX completed  -De cannulated 6/11 after RT could  pass suction catheter. CCM following, plan to monitor for now, may need tracheostomy in placed in future  per CCM - Per CCM if patient developed respiratory distress, inability to maintain his airway or trouble clearing secretions will need to have low threshold to reintubate. Chest x-ray 6/13 negative for PNA Remain stable since 6/11   Hypertension Continue Norvasc , Losartan , Lopressor  Continue with Hydralazine  BID   Transaminases Improved.  CT abdomen pelvis negative, acute hepatitis panel negative   Moderate to severe protein caloric malnutrition, POA Peg in place, TF per RD.    PEG tube malfunction, resolved PEG dislodged 5/10, replaced on 5/11 by IR   Exposure keratopathy, OS Seen by ophthalmology, Dr. Mason Sole on 4/7.  Treated with maxitrol ointment (neo-poly-dex) TID OS for 1 week. -- Refresh PM TID to QID OS for chronic lubrication   AKI with acute urinary retention Resolved.  Has Foley in placed   Macrocytic anemia thrombocytosis  stable/resolved   Perineal skin injury, POA  Continue Gerhard's Butt cream   Relative B12 and folate deficiency Continue replacement with MVI    Nutrition Problem: Inadequate oral intake Etiology: inability to eat   Interventions: Tube feeding, Prostat, MVI, Juven Nutrition Problem: Inadequate oral intake Etiology: inability to eat Signs/Symptoms: NPO status Interventions: Tube feeding, Prostat, MVI, Juven   Estimated body mass index is 31.17 kg/m as calculated from the following:   Height as of this encounter: 5' 8 (1.727 m).   Weight as of this encounter: 93 kg.  Interval History:  No events overnight.  No blink reflex.  Discussed with palliative care to see if his significant other is able to provide care at home.  Otherwise still needs placement or consideration of transition to hospice/comfort care.  Old records reviewed in assessment of this patient   DVT prophylaxis:  enoxaparin  (LOVENOX ) injection 40 mg Start: 01/17/24  1000 SCDs Start: 10/27/23 2226   Code Status:   Code Status: Do not attempt resuscitation (DNR) PRE-ARREST INTERVENTIONS DESIRED  Mobility Assessment (Last 72 Hours)     Mobility Assessment     Row Name 02/08/24 1012 02/08/24 0800 02/07/24 1930 02/07/24 0841 02/06/24 2000   Does patient have an order for bedrest or is patient medically unstable -- Yes- Bedfast (Level 1) - Complete No - Continue assessment No - Continue assessment No - Continue assessment   What is the highest level of mobility based on the progressive mobility assessment? Level 1 (Bedfast) - Unable to balance while sitting on edge of bed Level 1 (Bedfast) - Unable to balance while sitting on edge of bed Level 1 (Bedfast) - Unable to balance while sitting on edge of bed Level 1 (Bedfast) - Unable to balance while sitting on edge of bed Level 1 (Bedfast) - Unable to balance while sitting on edge of bed   Is the above level different from baseline mobility prior to current illness? -- No - Consider discontinuing PT/OT Yes - Recommend PT order Yes - Recommend PT order Yes - Recommend PT order    Row Name 02/06/24 0800 02/05/24 2000         Does patient have an order for bedrest or is patient medically unstable No - Continue assessment No - Continue assessment      What is the highest level of mobility based on the progressive mobility assessment? Level 1 (  Bedfast) - Unable to balance while sitting on edge of bed Level 1 (Bedfast) - Unable to balance while sitting on edge of bed      Is the above level different from baseline mobility prior to current illness? Yes - Recommend PT order Yes - Recommend PT order         Barriers to discharge: ?? Disposition Plan:  ?? Status is: Inpt  Objective: Blood pressure (!) 132/92, pulse 98, temperature 100.1 F (37.8 C), temperature source Oral, resp. rate 19, height 5' 8 (1.727 m), weight 93 kg, SpO2 98%.  Examination:  Physical Exam Constitutional:      Comments: Laying in bed  appearing uncomfortable but no distress.  Occasionally opening eyes with no consistent meaningful purpose  HENT:     Head:     Comments: Long hair hanging off the back of the bed    Mouth/Throat:     Mouth: Mucous membranes are dry.   Eyes:     Comments: Intermittent right eye movement at times with no consistent tracking or blink reflex   Cardiovascular:     Rate and Rhythm: Normal rate and regular rhythm.  Pulmonary:     Comments: Abnormal respiratory pattern with almost respiratory pauses Abdominal:     General: There is no distension.     Palpations: Abdomen is soft.   Musculoskeletal:        General: Normal range of motion.   Skin:    General: Skin is warm and dry.   Neurological:     Comments: Does not interact or follow commands.  Occasionally opens eyes with no meaningful purpose that can be repeated consistently.     Consultants:  Palliative care  Procedures:    Data Reviewed: Results for orders placed or performed during the hospital encounter of 10/27/23 (from the past 24 hours)  Glucose, capillary     Status: None   Collection Time: 02/07/24  5:49 PM  Result Value Ref Range   Glucose-Capillary 91 70 - 99 mg/dL   Comment 1 Notify RN   Glucose, capillary     Status: Abnormal   Collection Time: 02/07/24  8:16 PM  Result Value Ref Range   Glucose-Capillary 165 (H) 70 - 99 mg/dL  Glucose, capillary     Status: None   Collection Time: 02/07/24 11:27 PM  Result Value Ref Range   Glucose-Capillary 94 70 - 99 mg/dL  Glucose, capillary     Status: Abnormal   Collection Time: 02/08/24  5:08 AM  Result Value Ref Range   Glucose-Capillary 115 (H) 70 - 99 mg/dL  Glucose, capillary     Status: Abnormal   Collection Time: 02/08/24 10:07 AM  Result Value Ref Range   Glucose-Capillary 118 (H) 70 - 99 mg/dL   Comment 1 Notify RN    Comment 2 Document in Chart   Glucose, capillary     Status: Abnormal   Collection Time: 02/08/24  1:40 PM  Result Value Ref Range    Glucose-Capillary 136 (H) 70 - 99 mg/dL   Comment 1 Notify RN    Comment 2 Document in Chart     I have reviewed pertinent nursing notes, vitals, labs, and images as necessary. I have ordered labwork to follow up on as indicated.  I have reviewed the last notes from staff over past 24 hours. I have discussed patient's care plan and test results with nursing staff, CM/SW, and other staff as appropriate.    LOS: 104 days  Faith Homes, MD [facility] 02/08/2024, 4:09 PM

## 2024-02-08 NOTE — Progress Notes (Signed)
 Physical Therapy Treatment Patient Details Name: Garrett [name] MRN: [id number] DOB: 27-Dec-1975 Today's Date: 02/08/2024   History of Present Illness Pt is a 48 yo male who was found down 10/27/23 with agonal breathing. 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 Intubated 3/8, cortrak placed 3/14, trach placed 3/22, PEG placed 3/24. De-cannulated 6/10. PMH: HTN, smoker    PT Comments  Pt received in supine and alert, but with limited head nods or visual tracking this session. Performed PROM to neck to prevent stiffness and noted tear from pt's R eye. Pt unable to nod for whether he had pain or not. Noted flicker of movement in RLE during bed mobility and pt briefly squeezing with L hand, but does not perform again. Attempted visual tracking while sitting EOB, but minimal tracking noted with more vertical than horizontal. Pt continues to benefit from PT services to progress toward functional mobility goals.    If plan is discharge home, recommend the following: Two people to help with walking and/or transfers;Two people to help with bathing/dressing/bathroom;Assistance with cooking/housework;Assistance with feeding;Direct supervision/assist for medications management;Direct supervision/assist for financial management;Assist for transportation;Help with stairs or ramp for entrance;Supervision due to cognitive status   Can travel by private vehicle        Equipment Recommendations  [place] lift;Hospital bed;Wheelchair (measurements PT);Wheelchair cushion (measurements PT);BSC/3in1;Other (comment)    Recommendations for Other Services Rehab consult     Precautions / Restrictions Precautions Precautions: Fall Recall of Precautions/Restrictions: Impaired Required Braces or Orthoses: Other Brace Splint/Cast: Bil resting hand  splints Splint/Cast - Date Prophylactic Dressing Applied (if applicable): 11/02/23 Other Brace: has bilateral prevalon boots Restrictions Weight Bearing Restrictions Per Provider Order: No     Mobility  Bed Mobility Overal bed mobility: Needs Assistance Bed Mobility: Supine to Sit, Sit to Supine     Supine to sit: Total assist, +2 for physical assistance Sit to supine: Total assist, +2 for physical assistance   General bed mobility comments: +2 total via helicopter method    Transfers                        Ambulation/Gait                   Stairs             Wheelchair Mobility     Tilt Bed    Modified Rankin (Stroke Patients Only)       Balance Overall balance assessment: Needs assistance Sitting-balance support: Feet supported, No upper extremity supported Sitting balance-Leahy Scale: Zero Sitting balance - Comments: total assist at EOB                                    Communication Communication Communication: Impaired Factors Affecting Communication: Difficulty expressing self  Cognition Arousal: Alert Behavior During Therapy: Flat affect   PT - Cognitive impairments: Difficult to assess Difficult to assess due to: Impaired communication                       Following commands: Impaired Following commands impaired: Follows one step commands inconsistently, Follows one step commands with increased time    Cueing Cueing Techniques: Verbal cues, Visual cues  Exercises Other Exercises Other Exercises: PROM neck rotation  towards R    General Comments General comments (skin integrity, edema, etc.): HR up to 120 sitting EOB      Pertinent Vitals/Pain Pain Assessment Pain Assessment: Faces Faces Pain Scale: No hurt     PT Goals (current goals can now be found in the care plan section) Acute Rehab PT Goals Patient Stated Goal: unable to state PT Goal Formulation: Patient unable to participate in  goal setting Time For Goal Achievement: 01/28/24 Progress towards PT goals: Progressing toward goals    Frequency    Min 2X/week       AM-PAC PT 6 Clicks Mobility   Outcome Measure  Help needed turning from your back to your side while in a flat bed without using bedrails?: Total Help needed moving from lying on your back to sitting on the side of a flat bed without using bedrails?: Total Help needed moving to and from a bed to a chair (including a wheelchair)?: Total Help needed standing up from a chair using your arms (e.g., wheelchair or bedside chair)?: Total Help needed to walk in hospital room?: Total Help needed climbing 3-5 steps with a railing? 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

## 2024-02-09 DIAGNOSIS — Z7189 Other specified counseling: Secondary | ICD-10-CM | POA: Diagnosis not present

## 2024-02-09 DIAGNOSIS — I613 Nontraumatic intracerebral hemorrhage in brain stem: Secondary | ICD-10-CM | POA: Diagnosis not present

## 2024-02-09 DIAGNOSIS — Z789 Other specified health status: Secondary | ICD-10-CM | POA: Diagnosis not present

## 2024-02-09 DIAGNOSIS — Z515 Encounter for palliative care: Secondary | ICD-10-CM | POA: Diagnosis not present

## 2024-02-09 LAB — GLUCOSE, CAPILLARY
Glucose-Capillary: 136 mg/dL — ABNORMAL HIGH (ref 70–99)
Glucose-Capillary: 98 mg/dL (ref 70–99)
Glucose-Capillary: 116 mg/dL — ABNORMAL HIGH (ref 70–99)
Glucose-Capillary: 88 mg/dL (ref 70–99)
Glucose-Capillary: 80 mg/dL (ref 70–99)

## 2024-02-09 NOTE — Progress Notes (Signed)
 Progress Note    Garrett [name]   FMW:[id number]  DOB: 12-03-1975  DOA: 10/27/2023     105 PCP: Pcp, No  Initial CC: found unresponsive   Hospital Course: 48 year old with no known previous medical history was found down on 10/27/2023, unresponsive with UDS positive for opioids.  He was minimally responsive to Narcan  and he was found to be markedly hypertensive-  blood pressure 262/156.  CT head notable for 4.1 cm intracranial hemorrhage at the pons with intraventricular extension into the fourth ventricle and basal cisterns.  Initially admitted by critical care, and then extubated with tracheostomy, initially placed on 3/20, replaced on 01/05/2024.  PEG tube placed 11/08/2023.   Remains with poor response and poor prognosis at this time Now decannulated due to difficulty with passing suction catheter and being monitored for any decompensation.  PEG in place.    Significant hospital events: 3/8: Intubated in ED, pontine bleed/SAH. CT head Acute large hemorrhage 4.1 cm in pons with intraventricular extension without hydrocephalus. 3/9: CTA No change in IPH in pons, similar biparietal [facility] 3/14: Now awake, appears locked in syndrome.  Can follow commands with eyes. 3/16 Purposeful with left upper extremity  3/22 tracheostomy placed at bedside, bleeding issues overnight from trach site 3/24 PEG 3/24 Sputum culture with MRSA 4/20: trach was changed to cuffless #8 5/1: Mental status appears to be somewhat improving off narcotics, more awake alert following simple commands(able to grimace, move left toes, track vertically with right eye).  Speech following. Trach downsized to [employer] cuffless #6 XLT 5/4: Modafinil  held, low threshold to resume if mental status/ability to interact diminishes 5/10: PEG dislodged, foley bulb placed and IR consulted to replace tube 5/11: IR replaced PEG tube 5/13 - 5/14: + Fever.  Restarted antibiotics.  Urine culture + E. Coli, foley catheter changed. 5/17  Tracheostomy changed.   5/18: Completed antibiotics for UTI 5/23: Stable, no significant change, awaiting disability for placement. 6-11 Tracheostomy de cannulated 01/29/2024 after RT couldn't pass suction catheter.      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  CT head with acute large hemorrhage 4.1 cm in the pons, ventricular extension. blood pressure 260/1 50s on admission. Was admitted to the ICU and followed by Neuro.  - 2D echo ejection fraction 60%, A1c 4.7, LDL 96. -He was also treated with 3% hypertonic saline.  Follow-up CT head 3/29 with reduction in size of hemorrhage. - PEG dependent; poor prognosis - need repeat GOC discussions given no meaningful improvement now in 3+ months; POOR PROGNOSIS  Goals of care - discussed with neurology on 6/18; given no major improvement since admission, his prognosis is considered poor at this time; he has no meaningful recovery nor QOL - previously evaluated by PCM early in admission; will ask for re-consult to resume discussions with family  - strongly recommend withdrawal of care and focus on comfort and end of life care; POOR PROGNOSIS  E. coli UTI  Chronic foley Completed antibiotics course 5/18 Foley catheter was changed 5/13. 6/12.  Needs to exchange foley 7/12  Acute respiratory failure secondary to large pontine stroke brainstem compression Unable to protect airway/possible aspiration pneumonia - ABX completed  -De cannulated 6/11 after RT could  pass suction catheter. CCM following, plan to monitor for now, may need tracheostomy in placed in future  per CCM - Per CCM if patient developed respiratory distress, inability to maintain his airway or trouble clearing secretions will need to have low threshold to reintubate. Chest x-ray 6/13 negative for PNA Remain stable since 6/11   Hypertension Continue Norvasc , Losartan , Lopressor  Continue with Hydralazine  BID   Transaminases Improved.  CT abdomen pelvis negative, acute hepatitis panel negative   Moderate to severe protein caloric malnutrition, POA Peg in place, TF per RD.    PEG tube malfunction, resolved PEG dislodged 5/10, replaced on 5/11 by IR   Exposure keratopathy, OS Seen by ophthalmology, Dr. Maree on 4/7.  Treated with maxitrol ointment (neo-poly-dex) TID OS for 1 week. -- Refresh PM TID to QID OS for chronic lubrication   AKI with acute urinary retention Resolved.  Has Foley in placed   Macrocytic anemia thrombocytosis  stable/resolved   Perineal skin injury, POA  Continue Gerhard's Butt cream   Relative B12 and folate deficiency Continue replacement with MVI    Nutrition Problem: Inadequate oral intake Etiology: inability to eat   Interventions: Tube feeding, Prostat, MVI, Juven Nutrition Problem: Inadequate oral intake Etiology: inability to eat Signs/Symptoms: NPO status Interventions: Tube feeding, Prostat, MVI, Juven   Estimated body mass index is 31.17 kg/m as calculated from the following:   Height as of this encounter: 5' 8 (1.727 m).   Weight as of this encounter: 93 kg.  Interval History:  No events overnight.  No blink reflex.  Discussed with palliative care to see if his significant other is able to provide care at home.  Otherwise still needs placement or consideration of transition to hospice/comfort care.  Old records reviewed in assessment of this patient   DVT prophylaxis:  enoxaparin  (LOVENOX ) injection 40 mg Start: 01/17/24  1000 SCDs Start: 10/27/23 2226   Code Status:   Code Status: Do not attempt resuscitation (DNR) PRE-ARREST INTERVENTIONS DESIRED  Mobility Assessment (Last 72 Hours)     Mobility Assessment     Row Name 02/09/24 0800 02/08/24 1945 02/08/24 1012 02/08/24 0800 02/07/24 1930   Does patient have an order for bedrest or is patient medically unstable Yes- Bedfast (Level 1) - Complete Yes- Bedfast (Level 1) - Complete -- Yes- Bedfast (Level 1) - Complete No - Continue assessment   What is the highest level of mobility based on the progressive mobility assessment? Level 1 (Bedfast) - Unable to balance while sitting on edge of bed Level 1 (Bedfast) - Unable to balance while sitting on edge of bed Level 1 (Bedfast) - Unable to balance while sitting on edge of bed Level 1 (Bedfast) - Unable to balance while sitting on edge of bed Level 1 (Bedfast) - Unable to balance while sitting on edge of bed   Is the above level different from baseline mobility prior to current illness? Yes - Recommend PT order Yes - Recommend PT order -- No - Consider discontinuing PT/OT Yes - Recommend PT order    Row Name 02/07/24 0841 02/06/24 2000         Does patient have an order for bedrest or is patient medically unstable No - Continue assessment No - Continue assessment      What is the highest level of mobility based on the progressive  mobility assessment? Level 1 (Bedfast) - Unable to balance while sitting on edge of bed Level 1 (Bedfast) - Unable to balance while sitting on edge of bed      Is the above level different from baseline mobility prior to current illness? Yes - Recommend PT order Yes - Recommend PT order         Barriers to discharge: ?? Disposition Plan:  ?? Status is: Inpt  Objective: Blood pressure (!) 140/102, pulse 77, temperature 97.8 F (36.6 C), temperature source Axillary, resp. rate 19, height 5' 8 (1.727 m), weight 88 kg, SpO2 98%.  Examination:  Physical Exam Constitutional:       Comments: Laying in bed appearing uncomfortable but no distress.  Occasionally opening eyes with no consistent meaningful purpose  HENT:     Head:     Comments: Long hair hanging off the back of the bed    Mouth/Throat:     Mouth: Mucous membranes are dry.   Eyes:     Comments: Intermittent right eye movement at times with no consistent tracking or blink reflex   Cardiovascular:     Rate and Rhythm: Normal rate and regular rhythm.  Pulmonary:     Comments: Abnormal respiratory pattern with occasional respiratory pauses Abdominal:     General: There is no distension.     Palpations: Abdomen is soft.   Musculoskeletal:        General: Normal range of motion.   Skin:    General: Skin is warm and dry.   Neurological:     Comments: Does not interact or follow commands.  Occasionally opens eyes with no meaningful purpose that can be repeated consistently.     Consultants:  Palliative care  Procedures:    Data Reviewed: Results for orders placed or performed during the hospital encounter of 10/27/23 (from the past 24 hours)  Glucose, capillary     Status: Abnormal   Collection Time: 02/08/24  5:29 PM  Result Value Ref Range   Glucose-Capillary 110 (H) 70 - 99 mg/dL  Glucose, capillary     Status: Abnormal   Collection Time: 02/08/24  6:18 PM  Result Value Ref Range   Glucose-Capillary 160 (H) 70 - 99 mg/dL  Glucose, capillary     Status: Abnormal   Collection Time: 02/08/24  9:52 PM  Result Value Ref Range   Glucose-Capillary 132 (H) 70 - 99 mg/dL  Glucose, capillary     Status: None   Collection Time: 02/08/24 11:18 PM  Result Value Ref Range   Glucose-Capillary 92 70 - 99 mg/dL  Glucose, capillary     Status: Abnormal   Collection Time: 02/09/24  7:01 AM  Result Value Ref Range   Glucose-Capillary 136 (H) 70 - 99 mg/dL  Glucose, capillary     Status: None   Collection Time: 02/09/24  9:32 AM  Result Value Ref Range   Glucose-Capillary 98 70 - 99 mg/dL  Glucose,  capillary     Status: Abnormal   Collection Time: 02/09/24  1:17 PM  Result Value Ref Range   Glucose-Capillary 116 (H) 70 - 99 mg/dL    I have reviewed pertinent nursing notes, vitals, labs, and images as necessary. I have ordered labwork to follow up on as indicated.  I have reviewed the last notes from staff over past 24 hours. I have discussed patient's care plan and test results with nursing staff, CM/SW, and other staff as appropriate.    LOS: 105 days  Alm Apo, MD [facility] 02/09/2024, 2:00 PM

## 2024-02-09 NOTE — Plan of Care (Signed)
  Problem: Nutrition: Goal: Dietary intake will improve Outcome: Progressing   Problem: Education: Goal: Knowledge of disease or condition will improve Outcome: Not Progressing Goal: Knowledge of secondary prevention will improve (MUST DOCUMENT ALL) Outcome: Not Progressing Goal: Knowledge of patient specific risk factors will improve (DELETE if not current risk factor) Outcome: Not Progressing   Problem: Intracerebral Hemorrhage Tissue Perfusion: Goal: Complications of Intracerebral Hemorrhage will be minimized Outcome: Not Progressing   Problem: Coping: Goal: Will verbalize positive feelings about self Outcome: Not Progressing Goal: Will identify appropriate support needs Outcome: Not Progressing   Problem: Health Behavior/Discharge Planning: Goal: Ability to manage health-related needs will improve Outcome: Not Progressing Goal: Goals will be collaboratively established with patient/family Outcome: Not Progressing   Problem: Self-Care: Goal: Ability to participate in self-care as condition permits will improve Outcome: Not Progressing Goal: Verbalization of feelings and concerns over difficulty with self-care will improve Outcome: Not Progressing Goal: Ability to communicate needs accurately will improve Outcome: Not Progressing   Problem: Nutrition: Goal: Risk of aspiration will decrease Outcome: Not Progressing   Problem: Education: Goal: Ability to describe self-care measures that may prevent or decrease complications (Diabetes Survival Skills Education) will improve Outcome: Not Progressing   Problem: Coping: Goal: Ability to adjust to condition or change in health will improve Outcome: Not Progressing

## 2024-02-10 DIAGNOSIS — Z789 Other specified health status: Secondary | ICD-10-CM | POA: Diagnosis not present

## 2024-02-10 DIAGNOSIS — Z7189 Other specified counseling: Secondary | ICD-10-CM | POA: Diagnosis not present

## 2024-02-10 DIAGNOSIS — I613 Nontraumatic intracerebral hemorrhage in brain stem: Secondary | ICD-10-CM | POA: Diagnosis not present

## 2024-02-10 LAB — GLUCOSE, CAPILLARY
Glucose-Capillary: 90 mg/dL (ref 70–99)
Glucose-Capillary: 114 mg/dL — ABNORMAL HIGH (ref 70–99)
Glucose-Capillary: 114 mg/dL — ABNORMAL HIGH (ref 70–99)
Glucose-Capillary: 79 mg/dL (ref 70–99)
Glucose-Capillary: 110 mg/dL — ABNORMAL HIGH (ref 70–99)

## 2024-02-10 NOTE — Plan of Care (Signed)
  Problem: Nutrition: Goal: Dietary intake will improve Outcome: Progressing   Problem: Nutritional: Goal: Maintenance of adequate nutrition will improve Outcome: Progressing   Problem: Education: Goal: Knowledge of disease or condition will improve Outcome: Not Progressing Goal: Knowledge of secondary prevention will improve (MUST DOCUMENT ALL) Outcome: Not Progressing Goal: Knowledge of patient specific risk factors will improve (DELETE if not current risk factor) Outcome: Not Progressing   Problem: Intracerebral Hemorrhage Tissue Perfusion: Goal: Complications of Intracerebral Hemorrhage will be minimized Outcome: Not Progressing   Problem: Coping: Goal: Will verbalize positive feelings about self Outcome: Not Progressing Goal: Will identify appropriate support needs Outcome: Not Progressing   Problem: Health Behavior/Discharge Planning: Goal: Ability to manage health-related needs will improve Outcome: Not Progressing Goal: Goals will be collaboratively established with patient/family Outcome: Not Progressing   Problem: Self-Care: Goal: Ability to participate in self-care as condition permits will improve Outcome: Not Progressing Goal: Verbalization of feelings and concerns over difficulty with self-care will improve Outcome: Not Progressing Goal: Ability to communicate needs accurately will improve Outcome: Not Progressing   Problem: Nutrition: Goal: Risk of aspiration will decrease Outcome: Not Progressing   Problem: Education: Goal: Ability to describe self-care measures that may prevent or decrease complications (Diabetes Survival Skills Education) will improve Outcome: Not Progressing   Problem: Coping: Goal: Ability to adjust to condition or change in health will improve Outcome: Not Progressing   Problem: Health Behavior/Discharge Planning: Goal: Ability to identify and utilize available resources and services will improve Outcome: Not  Progressing Goal: Ability to manage health-related needs will improve Outcome: Not Progressing   Problem: Nutritional: Goal: Progress toward achieving an optimal weight will improve Outcome: Not Progressing   Problem: Skin Integrity: Goal: Risk for impaired skin integrity will decrease Outcome: Not Progressing

## 2024-02-10 NOTE — Progress Notes (Signed)
 Palliative Medicine Progress Note   Patient Name: Garrett [name]       Date: 02/10/2024 DOB: Apr 27, 1976  Age: 48 y.o. MRN#: [id number] Attending Physician: Patsy Lenis, MD Primary Care Physician: Pcp, No Admit Date: 10/27/2023  Reason for Consultation/Follow-up: Establishing goals of care  HPI/Patient Profile: 48 year old male with past medical history of hypertension who presented to the ED on 10/27/2023 after he was found down at home.  He was found to have an acute large intracranial hemorrhage centered in the pons with intraventricular extension into the fourth ventricle and basal cisterns.  He was initially intubated.  After extensive discussion, family decided to proceed with tracheostomy, initially placed on 11/08/23 and replaced on 01/05/24.  PEG tube placed 11/08/2023.  Patient remains poorly responsive. [name] was de-cannulated 01/29/24 due to difficulty with passing suction catheter.  LOS - 105 days  PMT was initially consulted 10/30/2023 for discussion about goals of care with regard to tracheostomy versus one-way extubation. Signed off 11/18/23. PMT re-consulted on 02/06/24 to revisit GOC with family, in the setting of minimal clinical improvement.    Subjective: Patient is lying in bed. He does not track and does not follow commands.   Discussion: I met with patient's significant other/Garrett [name] at bedside. I reintroduced myself and the role of palliative care as an extra layer of support for patients and families dealing with serious illness.  We reviewed patient's current clinical status. Garrett [name] reports that patient can intermittently move his left side, blink to command, and give a thumbs up (however this is not consistent with documented assessment of care team members). I  discussed with Garrett [name] that at this point, patient will not likely have additional neurologic recovery. I encouraged Garrett [name] to consider his quality of life, and posed the question of whether patient would want to continue living in his current state.  After consideration, Garrett [name]  she remains hopeful that patient will eventually have further clinical improvement, at least that he could sit up in a wheelchair. She expresses interest in caring for him at home, with hope this would give him better quality of life.   Sharene states that patient's mother is coming to the hospital this evening, and should arrive around 5:30p and is requesting an update. Unfortunately I will not be here at that time, but I messaged Dr. Patsy and he  is able to speak with mother when she arrives.    Objective:  Physical Exam Vitals reviewed.  Constitutional:      General: He is awake. He is not in acute distress.    Appearance: He is ill-appearing.  Pulmonary:     Effort: No respiratory distress.   Neurological:     Motor: Weakness present.     Comments: Does not track Does not follow commands                 Palliative Medicine Assessment & Plan   Assessment: Principal Problem:   ICH (intracerebral hemorrhage) (HCC) Active Problems:   Advanced care planning/counseling discussion   Pressure injury of skin   Goals of care, counseling/discussion   Poor prognosis    Recommendations/Plan: Continue current supportive care Significant other/Garrett [name] remains hopeful for additional clinical improvement PMT will continue to follow   Code Status: DNR - interventions (need to clarify this)   Prognosis:  Unable to determine  Discharge Planning: To Be Determined    Thank you for allowing the Palliative Medicine Team to assist in the care of this patient.   Time: 35 minutes   Recardo KATHEE Loll, NP   Please contact Palliative Medicine Team phone at [phone] for questions and concerns.  For  individual providers, please see AMION.

## 2024-02-10 NOTE — Progress Notes (Signed)
 Progress Note    Garrett [name]   FMW:[id number]  DOB: Nov 15, 1975  DOA: 10/27/2023     106 PCP: Pcp, No  Initial CC: found unresponsive   Hospital Course: 48 year old with no known previous medical history was found down on 10/27/2023, unresponsive with UDS positive for opioids.  He was minimally responsive to Narcan  and he was found to be markedly hypertensive-  blood pressure 262/156.  CT head notable for 4.1 cm intracranial hemorrhage at the pons with intraventricular extension into the fourth ventricle and basal cisterns.  Initially admitted by critical care, and then extubated with tracheostomy, initially placed on 3/20, replaced on 01/05/2024.  PEG tube placed 11/08/2023.   Remains with poor response and poor prognosis at this time Now decannulated due to difficulty with passing suction catheter and being monitored for any decompensation.  PEG in place.    Significant hospital events: 3/8: Intubated in ED, pontine bleed/SAH. CT head Acute large hemorrhage 4.1 cm in pons with intraventricular extension without hydrocephalus. 3/9: CTA No change in IPH in pons, similar biparietal [facility] 3/14: Now awake, appears locked in syndrome.  Can follow commands with eyes. 3/16 Purposeful with left upper extremity  3/22 tracheostomy placed at bedside, bleeding issues overnight from trach site 3/24 PEG 3/24 Sputum culture with MRSA 4/20: trach was changed to cuffless #8 5/1: Mental status appears to be somewhat improving off narcotics, more awake alert following simple commands(able to grimace, move left toes, track vertically with right eye).  Speech following. Trach downsized to [employer] cuffless #6 XLT 5/4: Modafinil  held, low threshold to resume if mental status/ability to interact diminishes 5/10: PEG dislodged, foley bulb placed and IR consulted to replace tube 5/11: IR replaced PEG tube 5/13 - 5/14: + Fever.  Restarted antibiotics.  Urine culture + E. Coli, foley catheter changed. 5/17  Tracheostomy changed.   5/18: Completed antibiotics for UTI 5/23: Stable, no significant change, awaiting disability for placement. 6-11 Tracheostomy de cannulated 01/29/2024 after RT couldn't pass suction catheter.      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  CT head with acute large hemorrhage 4.1 cm in the pons, ventricular extension. blood pressure 260/1 50s on admission. Was admitted to the ICU and followed by Neuro.  - 2D echo ejection fraction 60%, A1c 4.7, LDL 96. -He was also treated with 3% hypertonic saline.  Follow-up CT head 3/29 with reduction in size of hemorrhage. - PEG dependent; poor prognosis - need repeat GOC discussions given no meaningful improvement now in 3+ months; POOR PROGNOSIS  Goals of care - discussed with neurology on 6/18; given no major improvement since admission, his prognosis is considered poor at this time; he has no meaningful recovery nor QOL - previously evaluated by PCM early in admission; will ask for re-consult to resume discussions with family  - strongly recommend withdrawal of care and focus on comfort and end of life care; POOR PROGNOSIS - Family meeting held with mom and his fiance in person and his dad was on the phone on 02/09/2024; they have at least  decided that he is NOT safe for returning home with 24/7 care and they would like to pursue placement once found.  They were not willing to consider de-escalation of care despite discussion of poor prognosis and  extremely low chance of any meaningful recovery - PCM back on board; hopeful for more code discussions as well; still would get intubated again if decompensated  E. coli UTI  Chronic foley Completed antibiotics course 5/18 Foley catheter was changed 5/13. 6/12.  Needs to exchange foley 7/12  Acute respiratory failure secondary to large pontine stroke brainstem compression Unable to protect airway/possible aspiration pneumonia - ABX completed  -De cannulated 6/11 after RT could pass suction catheter. CCM following, plan to monitor for now, may need tracheostomy in placed in future  per CCM - Per CCM if patient developed respiratory distress, inability to maintain his airway or trouble clearing secretions will need to have low threshold to reintubate. Chest x-ray 6/13 negative for PNA Remain stable since 6/11   Hypertension Continue Norvasc , Losartan , Lopressor  Continue with Hydralazine  BID   Transaminases Improved.  CT abdomen pelvis negative, acute hepatitis panel negative   Moderate to severe protein caloric malnutrition, POA Peg in place, TF per RD.    PEG tube malfunction, resolved PEG dislodged 5/10, replaced on 5/11 by IR   Exposure keratopathy, OS Seen by ophthalmology, Dr. Maree on 4/7.  Treated with maxitrol ointment (neo-poly-dex) TID OS for 1 week. -- Refresh PM TID to QID OS for chronic lubrication   AKI with acute urinary retention Resolved.  Has Foley in placed   Macrocytic anemia thrombocytosis  stable/resolved   Perineal skin injury, POA  Continue Gerhard's Butt cream   Relative B12 and folate deficiency Continue replacement with MVI    Nutrition Problem: Inadequate oral intake Etiology: inability to eat   Interventions: Tube feeding, Prostat, MVI, Juven Nutrition Problem: Inadequate oral intake Etiology: inability to eat Signs/Symptoms: NPO status Interventions: Tube feeding, Prostat, MVI, Juven   Estimated body mass index is 31.17 kg/m as  calculated from the following:   Height as of this encounter: 5' 8 (1.727 m).   Weight as of this encounter: 93 kg.  Interval History:  No events overnight.  No blink reflex.  Productive family meeting held 02/09/2024.  We at least have a confirmed and established plan at this time.  We will pursue placement once insurance issues are resolved and placement is found.  Old records reviewed in assessment of this patient   DVT prophylaxis:  enoxaparin  (LOVENOX ) injection 40 mg Start: 01/17/24 1000 SCDs Start: 10/27/23 2226   Code Status:   Code Status: Do not attempt resuscitation (DNR) PRE-ARREST INTERVENTIONS DESIRED  Mobility Assessment (Last 72 Hours)     Mobility Assessment     Row Name 02/10/24 0800 02/09/24 1930 02/09/24 0800 02/08/24 1945 02/08/24 1012   Does patient have an order for bedrest or is patient medically unstable Yes- Bedfast (Level 1) - Complete Yes- Bedfast (Level 1) - Complete Yes- Bedfast (Level 1) - Complete Yes- Bedfast (Level 1) - Complete --   What is the highest level of mobility based on the progressive mobility assessment? Level 1 (Bedfast) - Unable to balance while sitting on edge of bed Level 1 (Bedfast) - Unable to balance while sitting on edge of bed Level 1 (Bedfast) - Unable to balance while sitting on edge of bed Level 1 (Bedfast) - Unable to balance while sitting on edge of bed Level 1 (Bedfast) - Unable to balance while sitting  on edge of bed   Is the above level different from baseline mobility prior to current illness? Yes - Recommend PT order Yes - Recommend PT order Yes - Recommend PT order Yes - Recommend PT order --    Row Name 02/08/24 0800 02/07/24 1930         Does patient have an order for bedrest or is patient medically unstable Yes- Bedfast (Level 1) - Complete No - Continue assessment      What is the highest level of mobility based on the progressive mobility assessment? Level 1 (Bedfast) - Unable to balance while sitting on edge of  bed Level 1 (Bedfast) - Unable to balance while sitting on edge of bed      Is the above level different from baseline mobility prior to current illness? No - Consider discontinuing PT/OT Yes - Recommend PT order         Barriers to discharge: Getting insurance Disposition Plan:  SNF once barriers resolved Status is: Inpt  Objective: Blood pressure 139/88, pulse 68, temperature 97.9 F (36.6 C), temperature source Axillary, resp. rate 18, height 5' 8 (1.727 m), weight 92 kg, SpO2 97%.  Examination:  Physical Exam Constitutional:      Comments: Laying in bed appearing uncomfortable but no distress.  Occasionally opening eyes with no consistent meaningful purpose  HENT:     Head:     Comments: Long hair hanging off the back of the bed    Mouth/Throat:     Mouth: Mucous membranes are dry.   Eyes:     Comments: Intermittent right eye movement at times with no consistent tracking or blink reflex   Cardiovascular:     Rate and Rhythm: Normal rate and regular rhythm.  Pulmonary:     Comments: Abnormal respiratory pattern with occasional respiratory pauses Abdominal:     General: There is no distension.     Palpations: Abdomen is soft.   Musculoskeletal:        General: Normal range of motion.   Skin:    General: Skin is warm and dry.   Neurological:     Comments: Does not interact or follow commands.  Occasionally opens eyes with no meaningful purpose that can be repeated consistently.     Consultants:  Palliative care  Procedures:    Data Reviewed: Results for orders placed or performed during the hospital encounter of 10/27/23 (from the past 24 hours)  Glucose, capillary     Status: Abnormal   Collection Time: 02/09/24  1:17 PM  Result Value Ref Range   Glucose-Capillary 116 (H) 70 - 99 mg/dL  Glucose, capillary     Status: None   Collection Time: 02/09/24  5:36 PM  Result Value Ref Range   Glucose-Capillary 88 70 - 99 mg/dL  Glucose, capillary     Status: None    Collection Time: 02/09/24 10:12 PM  Result Value Ref Range   Glucose-Capillary 80 70 - 99 mg/dL   Comment 1 Notify RN    Comment 2 Document in Chart   Glucose, capillary     Status: None   Collection Time: 02/10/24  5:31 AM  Result Value Ref Range   Glucose-Capillary 90 70 - 99 mg/dL   Comment 1 Notify RN    Comment 2 Document in Chart   Glucose, capillary     Status: Abnormal   Collection Time: 02/10/24  9:29 AM  Result Value Ref Range   Glucose-Capillary 114 (H) 70 - 99 mg/dL  I have reviewed pertinent nursing notes, vitals, labs, and images as necessary. I have ordered labwork to follow up on as indicated.  I have reviewed the last notes from staff over past 24 hours. I have discussed patient's care plan and test results with nursing staff, CM/SW, and other staff as appropriate.    LOS: 106 days   Alm Apo, MD [facility] 02/10/2024, 12:51 PM

## 2024-02-11 DIAGNOSIS — Z789 Other specified health status: Secondary | ICD-10-CM | POA: Diagnosis not present

## 2024-02-11 DIAGNOSIS — Z7189 Other specified counseling: Secondary | ICD-10-CM | POA: Diagnosis not present

## 2024-02-11 DIAGNOSIS — I613 Nontraumatic intracerebral hemorrhage in brain stem: Secondary | ICD-10-CM | POA: Diagnosis not present

## 2024-02-11 LAB — GLUCOSE, CAPILLARY
Glucose-Capillary: 108 mg/dL — ABNORMAL HIGH (ref 70–99)
Glucose-Capillary: 128 mg/dL — ABNORMAL HIGH (ref 70–99)
Glucose-Capillary: 139 mg/dL — ABNORMAL HIGH (ref 70–99)
Glucose-Capillary: 129 mg/dL — ABNORMAL HIGH (ref 70–99)
Glucose-Capillary: 85 mg/dL (ref 70–99)
Glucose-Capillary: 137 mg/dL — ABNORMAL HIGH (ref 70–99)
Glucose-Capillary: 104 mg/dL — ABNORMAL HIGH (ref 70–99)

## 2024-02-11 NOTE — Progress Notes (Signed)
 Progress Note    Garrett [name]   FMW:[id number]  DOB: September 17, 1975  DOA: 10/27/2023     107 PCP: Pcp, No  Initial CC: found unresponsive   Hospital Course: 48 year old with no known previous medical history was found down on 10/27/2023, unresponsive with UDS positive for opioids.  He was minimally responsive to Narcan  and he was found to be markedly hypertensive-  blood pressure 262/156.  CT head notable for 4.1 cm intracranial hemorrhage at the pons with intraventricular extension into the fourth ventricle and basal cisterns.  Initially admitted by critical care, and then extubated with tracheostomy, initially placed on 3/20, replaced on 01/05/2024.  PEG tube placed 11/08/2023.   Remains with poor response and poor prognosis at this time Now decannulated due to difficulty with passing suction catheter and being monitored for any decompensation.  PEG in place.    Significant hospital events: 3/8: Intubated in ED, pontine bleed/SAH. CT head Acute large hemorrhage 4.1 cm in pons with intraventricular extension without hydrocephalus. 3/9: CTA No change in IPH in pons, similar biparietal [facility] 3/14: Now awake, appears locked in syndrome.  Can follow commands with eyes. 3/16 Purposeful with left upper extremity  3/22 tracheostomy placed at bedside, bleeding issues overnight from trach site 3/24 PEG 3/24 Sputum culture with MRSA 4/20: trach was changed to cuffless #8 5/1: Mental status appears to be somewhat improving off narcotics, more awake alert following simple commands(able to grimace, move left toes, track vertically with right eye).  Speech following. Trach downsized to [employer] cuffless #6 XLT 5/4: Modafinil  held, low threshold to resume if mental status/ability to interact diminishes 5/10: PEG dislodged, foley bulb placed and IR consulted to replace tube 5/11: IR replaced PEG tube 5/13 - 5/14: + Fever.  Restarted antibiotics.  Urine culture + E. Coli, foley catheter changed. 5/17  Tracheostomy changed.   5/18: Completed antibiotics for UTI 5/23: Stable, no significant change, awaiting disability for placement. 6-11 Tracheostomy de cannulated 01/29/2024 after RT couldn't pass suction catheter.      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  CT head with acute large hemorrhage 4.1 cm in the pons, ventricular extension. blood pressure 260/1 50s on admission. Was admitted to the ICU and followed by Neuro.  - 2D echo ejection fraction 60%, A1c 4.7, LDL 96. -He was also treated with 3% hypertonic saline.  Follow-up CT head 3/29 with reduction in size of hemorrhage. - PEG dependent; poor prognosis - need repeat GOC discussions given no meaningful improvement now in 3+ months; POOR PROGNOSIS  Goals of care - discussed with neurology on 6/18; given no major improvement since admission, his prognosis is considered poor at this time; he has no meaningful recovery nor QOL - previously evaluated by PCM early in admission; will ask for re-consult to resume discussions with family  - strongly recommend withdrawal of care and focus on comfort and end of life care; POOR PROGNOSIS - Family meeting held with mom and his fiance in person and his dad was on the phone on 02/09/2024; they have at least  decided that he is NOT safe for returning home with 24/7 care and they would like to pursue placement once found.  They were not willing to consider de-escalation of care despite discussion of poor prognosis and  extremely low chance of any meaningful recovery - PCM back on board; hopeful for more code discussions as well; still would get intubated again if decompensated  E. coli UTI  Chronic foley Completed antibiotics course 5/18 Foley catheter was changed 5/13. 6/12.  Needs to exchange foley 7/12  Acute respiratory failure secondary to large pontine stroke brainstem compression Unable to protect airway/possible aspiration pneumonia - ABX completed  -De cannulated 6/11 after RT could pass suction catheter. CCM following, plan to monitor for now, may need tracheostomy in placed in future  per CCM - Per CCM if patient developed respiratory distress, inability to maintain his airway or trouble clearing secretions will need to have low threshold to reintubate. Chest x-ray 6/13 negative for PNA Remain stable since 6/11   Hypertension Continue Norvasc , Losartan , Lopressor  Continue with Hydralazine  BID   Transaminases Improved.  CT abdomen pelvis negative, acute hepatitis panel negative   Moderate to severe protein caloric malnutrition, POA Peg in place, TF per RD.    PEG tube malfunction, resolved PEG dislodged 5/10, replaced on 5/11 by IR   Exposure keratopathy, OS Seen by ophthalmology, Dr. Maree on 4/7.  Treated with maxitrol ointment (neo-poly-dex) TID OS for 1 week. -- Refresh PM TID to QID OS for chronic lubrication   AKI with acute urinary retention Resolved.  Has Foley in placed   Macrocytic anemia thrombocytosis  stable/resolved   Perineal skin injury, POA  Continue Gerhard's Butt cream   Relative B12 and folate deficiency Continue replacement with MVI    Nutrition Problem: Inadequate oral intake Etiology: inability to eat   Interventions: Tube feeding, Prostat, MVI, Juven Nutrition Problem: Inadequate oral intake Etiology: inability to eat Signs/Symptoms: NPO status Interventions: Tube feeding, Prostat, MVI, Juven   Estimated body mass index is 31.17 kg/m as  calculated from the following:   Height as of this encounter: 5' 8 (1.727 m).   Weight as of this encounter: 93 kg.  Interval History:  No events overnight.  No blink reflex. Still doesn't follow any commands for me (6 days of trying).  Andrez present this morning, adamant that he does do some things with R eye and blinking and moving left side for her and for staff.  Either way, I've expressed my medical opinion that prognosis remains poor and he has not achieved meaningful recovery, but for Andrez and his mom/dad, it is meaningful recovery and that God will be the one to decide when he dies.   Plan remains for him to go to a facility once he has insurance etc and a facility is found.    DVT prophylaxis:  enoxaparin  (LOVENOX ) injection 40 mg Start: 01/17/24 1000 SCDs Start: 10/27/23 2226   Code Status:   Code Status: Do not attempt resuscitation (DNR) PRE-ARREST INTERVENTIONS DESIRED  Mobility Assessment (Last 72 Hours)     Mobility Assessment     Row Name 02/11/24 0730 02/10/24 1930 02/10/24 0800 02/09/24 1930 02/09/24 0800   Does patient have an order for bedrest or is patient medically unstable Yes- Bedfast (Level 1) - Complete Yes- Bedfast (Level 1) - Complete Yes- Bedfast (Level 1) - Complete Yes- Bedfast (Level 1) - Complete Yes- Bedfast (Level 1) - Complete   What is the highest level of mobility based on the progressive  mobility assessment? Level 1 (Bedfast) - Unable to balance while sitting on edge of bed Level 1 (Bedfast) - Unable to balance while sitting on edge of bed Level 1 (Bedfast) - Unable to balance while sitting on edge of bed Level 1 (Bedfast) - Unable to balance while sitting on edge of bed Level 1 (Bedfast) - Unable to balance while sitting on edge of bed   Is the above level different from baseline mobility prior to current illness? Yes - Recommend PT order Yes - Recommend PT order Yes - Recommend PT order Yes - Recommend PT order Yes - Recommend PT order    Row  Name 02/08/24 1945           Does patient have an order for bedrest or is patient medically unstable Yes- Bedfast (Level 1) - Complete       What is the highest level of mobility based on the progressive mobility assessment? Level 1 (Bedfast) - Unable to balance while sitting on edge of bed       Is the above level different from baseline mobility prior to current illness? Yes - Recommend PT order          Barriers to discharge: Getting insurance Disposition Plan:  SNF once barriers resolved Status is: Inpt  Objective: Blood pressure (!) 142/95, pulse 77, temperature 97.7 F (36.5 C), temperature source Axillary, resp. rate 15, height 5' 8 (1.727 m), weight 92 kg, SpO2 100%.  Examination:  Physical Exam Constitutional:      Comments: Laying in bed appearing uncomfortable but no distress.  Occasionally opening eyes with no consistent meaningful purpose  HENT:     Head:     Comments: Long hair hanging off the back of the bed    Mouth/Throat:     Mouth: Mucous membranes are dry.   Eyes:     Comments: Intermittent right eye movement at times with no consistent tracking or blink reflex   Cardiovascular:     Rate and Rhythm: Normal rate and regular rhythm.  Pulmonary:     Comments: Abnormal respiratory pattern with occasional respiratory pauses Abdominal:     General: There is no distension.     Palpations: Abdomen is soft.   Musculoskeletal:        General: Normal range of motion.   Skin:    General: Skin is warm and dry.   Neurological:     Comments: Does not interact or follow commands.  Occasionally opens eyes with no meaningful purpose that can be repeated consistently.     Consultants:  Palliative care  Procedures:    Data Reviewed: Results for orders placed or performed during the hospital encounter of 10/27/23 (from the past 24 hours)  Glucose, capillary     Status: None   Collection Time: 02/10/24  5:57 PM  Result Value Ref Range   Glucose-Capillary 79  70 - 99 mg/dL  Glucose, capillary     Status: Abnormal   Collection Time: 02/10/24 10:04 PM  Result Value Ref Range   Glucose-Capillary 110 (H) 70 - 99 mg/dL  Glucose, capillary     Status: Abnormal   Collection Time: 02/11/24  5:11 AM  Result Value Ref Range   Glucose-Capillary 108 (H) 70 - 99 mg/dL  Glucose, capillary     Status: Abnormal   Collection Time: 02/11/24  6:29 AM  Result Value Ref Range   Glucose-Capillary 128 (H) 70 - 99 mg/dL  Glucose, capillary     Status: Abnormal  Collection Time: 02/11/24 11:24 AM  Result Value Ref Range   Glucose-Capillary 139 (H) 70 - 99 mg/dL   Comment 1 Notify RN    Comment 2 Document in Chart   Glucose, capillary     Status: Abnormal   Collection Time: 02/11/24  1:31 PM  Result Value Ref Range   Glucose-Capillary 129 (H) 70 - 99 mg/dL   Comment 1 Notify RN    Comment 2 Document in Chart     I have reviewed pertinent nursing notes, vitals, labs, and images as necessary. I have ordered labwork to follow up on as indicated.  I have reviewed the last notes from staff over past 24 hours. I have discussed patient's care plan and test results with nursing staff, CM/SW, and other staff as appropriate.    LOS: 107 days   Alm Apo, MD [facility] 02/11/2024, 1:41 PM

## 2024-02-11 NOTE — Progress Notes (Signed)
 Nutrition Follow-up  DOCUMENTATION CODES:  Not applicable  INTERVENTION:  Continue tube feeding via PEG 5 cartons of Osmolite 1.5 per day Prosource TF20 60 ml TID free water  6x/d Provides 2015 kcal, 134 gm protein, 900 ml free water  daily ( free water , TF + flush) MVI with minerals daily Banatrol BID to aid in adding bulk to stools  NUTRITION DIAGNOSIS:  Inadequate oral intake related to inability to eat as evidenced by NPO status. - Still applicable  GOAL:  Patient will meet greater than or equal to 90% of their needs - Met via TF  MONITOR:  Labs, Weight trends, TF tolerance, Skin  REASON FOR ASSESSMENT:  Consult Enteral/tube feeding initiation and management  ASSESSMENT:  Pt with PMH of HTN, daily mariajuana, and smoker admitted after being found down at home with pontine hemorrhagic stroke and trace SAH. UDS positive for opioids. Noted aspiration PNA on admission.  3/08 - admitted with [facility], intubated 3/14 - s/p cortrak placement (tip gastric) 3/22 - s/p tracheostomy 3/24 - s/p PEG 4/20 - Cuff changed to cuffless 5/10 - PEG dislodged, TF's stopped  5/11 - PEG replaced, TF's restarted at goal  6/10 - trach removed  Ongoing discharge planning, plan for facility once able. Pt laying in bed, significant other at bedside. Reports that pt continues to tolerate feeds well, no vomiting. Significant other ask about pt able to swallow and take PO, RD deferred ability to swallow to speech. No additional concerns at this time.   Admit weight: 103 kg  Current weight: 92 kg    Nutrition Related Medications: Pepcid , Banatrol TF, NovoLog  0-15 units 5 times/day, MVI Labs: reviewed  CBG: 79-128 mg/dL x 24 hrs   UOP: 199 mL x 24 hrs   Diet Order:   Diet Order             Diet NPO time specified  Diet effective now                  EDUCATION NEEDS: No education needs have been identified at this time  Skin:  Skin Assessment: Skin Integrity Issues: Skin  Integrity Issues:: Other (Comment) Stage II: Stage 2, perineum Other: Non pressure wound, left eye  Last BM:  6/22  Height:  Ht Readings from Last 1 Encounters:  11/22/23 5' 8 (1.727 m)   Weight:  Wt Readings from Last 1 Encounters:  02/10/24 92 kg   Ideal Body Weight:  70 kg  BMI:  Body mass index is 30.84 kg/m.  Estimated Nutritional Needs:  Kcal:  2000-2200 Protein:  125-140 grams Fluid:  >2 L/day   Nestora Glatter RD, LDN Clinical Dietitian

## 2024-02-11 NOTE — TOC Progression Note (Addendum)
 Transition of Care [facility]) - Progression Note    Patient Details  Name: Garrett [name] MRN: [id number] Date of Birth: 09/27/1975  Transition of Care [facility]) CM/SW Contact  Andrez JULIANNA George, RN Phone Number: 02/11/2024, 1:18 PM  Clinical Narrative:     Palliative and MD has talked to family. Plan is to continue treatment. Family wants SNF once disability has been approved.  TOC following.   Expected Discharge Plan: Long Term Nursing Home   Expected Discharge Plan and Services In-house Referral: Clinical Social Work, Hospice / Palliative Care   Post Acute Care Choice: Skilled Nursing Facility Living arrangements for the past 2 months: Single Family Home                                       Social Determinants of Health (SDOH) Interventions SDOH Screenings   Food Insecurity: Patient Unable To Answer (10/29/2023)  Social Connections: Unknown (06/23/2023)   Received from [facility]  Tobacco Use: High Risk (10/31/2023)    Readmission Risk Interventions     No data to display

## 2024-02-12 DIAGNOSIS — Z789 Other specified health status: Secondary | ICD-10-CM | POA: Diagnosis not present

## 2024-02-12 DIAGNOSIS — Z7189 Other specified counseling: Secondary | ICD-10-CM | POA: Diagnosis not present

## 2024-02-12 DIAGNOSIS — I613 Nontraumatic intracerebral hemorrhage in brain stem: Secondary | ICD-10-CM | POA: Diagnosis not present

## 2024-02-12 LAB — GLUCOSE, CAPILLARY
Glucose-Capillary: 113 mg/dL — ABNORMAL HIGH (ref 70–99)
Glucose-Capillary: 151 mg/dL — ABNORMAL HIGH (ref 70–99)
Glucose-Capillary: 137 mg/dL — ABNORMAL HIGH (ref 70–99)
Glucose-Capillary: 123 mg/dL — ABNORMAL HIGH (ref 70–99)
Glucose-Capillary: 135 mg/dL — ABNORMAL HIGH (ref 70–99)

## 2024-02-12 NOTE — Progress Notes (Signed)
 Progress Note    Garrett [name]   FMW:[id number]  DOB: 1976/06/03  DOA: 10/27/2023     108 PCP: Pcp, No  Initial CC: found unresponsive   Hospital Course: 48 year old with no known previous medical history was found down on 10/27/2023, unresponsive with UDS positive for opioids.  He was minimally responsive to Narcan  and he was found to be markedly hypertensive-  blood pressure 262/156.  CT head notable for 4.1 cm intracranial hemorrhage at the pons with intraventricular extension into the fourth ventricle and basal cisterns.  Initially admitted by critical care, and then extubated with tracheostomy, initially placed on 3/20, replaced on 01/05/2024.  PEG tube placed 11/08/2023.   Remains with poor response and poor prognosis at this time Now decannulated due to difficulty with passing suction catheter and being monitored for any decompensation.  PEG in place.    Significant hospital events: 3/8: Intubated in ED, pontine bleed/SAH. CT head Acute large hemorrhage 4.1 cm in pons with intraventricular extension without hydrocephalus. 3/9: CTA No change in IPH in pons, similar biparietal [facility] 3/14: Now awake, appears locked in syndrome.  Can follow commands with eyes. 3/16 Purposeful with left upper extremity  3/22 tracheostomy placed at bedside, bleeding issues overnight from trach site 3/24 PEG 3/24 Sputum culture with MRSA 4/20: trach was changed to cuffless #8 5/1: Mental status appears to be somewhat improving off narcotics, more awake alert following simple commands(able to grimace, move left toes, track vertically with right eye).  Speech following. Trach downsized to [employer] cuffless #6 XLT 5/4: Modafinil  held, low threshold to resume if mental status/ability to interact diminishes 5/10: PEG dislodged, foley bulb placed and IR consulted to replace tube 5/11: IR replaced PEG tube 5/13 - 5/14: + Fever.  Restarted antibiotics.  Urine culture + E. Coli, foley catheter changed. 5/17  Tracheostomy changed.   5/18: Completed antibiotics for UTI 5/23: Stable, no significant change, awaiting disability for placement. 6-11 Tracheostomy de cannulated 01/29/2024 after RT couldn't pass suction catheter.      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  CT head with acute large hemorrhage 4.1 cm in the pons, ventricular extension. blood pressure 260/1 50s on admission. Was admitted to the ICU and followed by Neuro.  - 2D echo ejection fraction 60%, A1c 4.7, LDL 96. -He was also treated with 3% hypertonic saline.  Follow-up CT head 3/29 with reduction in size of hemorrhage. - PEG dependent; poor prognosis - need repeat GOC discussions given no meaningful improvement now in 3+ months; POOR PROGNOSIS  Goals of care - discussed with neurology on 6/18; given no major improvement since admission, his prognosis is considered poor at this time; he has no meaningful recovery nor QOL - previously evaluated by PCM early in admission; will ask for re-consult to resume discussions with family  - strongly recommend withdrawal of care and focus on comfort and end of life care; POOR PROGNOSIS - Family meeting held with mom and his fiance in person and his dad was on the phone on 02/09/2024; they have at least  decided that he is NOT safe for returning home with 24/7 care and they would like to pursue placement once found.  They were not willing to consider de-escalation of care despite discussion of poor prognosis and  extremely low chance of any meaningful recovery - PCM back on board; hopeful for more code discussions as well; still would get intubated again if decompensated  E. coli UTI  Chronic foley Completed antibiotics course 5/18 Foley catheter was changed 5/13. 6/12.  Needs to exchange foley 7/12  Acute respiratory failure secondary to large pontine stroke brainstem compression Unable to protect airway/possible aspiration pneumonia - ABX completed  -De cannulated 6/11 after RT could pass suction catheter. CCM following, plan to monitor for now, may need tracheostomy in placed in future  per CCM - Per CCM if patient developed respiratory distress, inability to maintain his airway or trouble clearing secretions will need to have low threshold to reintubate. Chest x-ray 6/13 negative for PNA Remain stable since 6/11   Hypertension Continue Norvasc , Losartan , Lopressor  Continue with Hydralazine  BID   Transaminases Improved.  CT abdomen pelvis negative, acute hepatitis panel negative   Moderate to severe protein caloric malnutrition, POA Peg in place, TF per RD.    PEG tube malfunction, resolved PEG dislodged 5/10, replaced on 5/11 by IR   Exposure keratopathy, OS Seen by ophthalmology, Dr. Maree on 4/7.  Treated with maxitrol ointment (neo-poly-dex) TID OS for 1 week. -- Refresh PM TID to QID OS for chronic lubrication   AKI with acute urinary retention Resolved.  Has Foley in placed   Macrocytic anemia thrombocytosis  stable/resolved   Perineal skin injury, POA  Continue Gerhard's Butt cream   Relative B12 and folate deficiency Continue replacement with MVI    Nutrition Problem: Inadequate oral intake Etiology: inability to eat   Interventions: Tube feeding, Prostat, MVI, Juven Nutrition Problem: Inadequate oral intake Etiology: inability to eat Signs/Symptoms: NPO status Interventions: Tube feeding, Prostat, MVI, Juven   Estimated body mass index is 31.17 kg/m as  calculated from the following:   Height as of this encounter: 5' 8 (1.727 m).   Weight as of this encounter: 93 kg.  Interval History:  No events overnight.  No blink reflex. Still doesn't follow any commands for me (7 days of trying).  Plan remains for him to go to a facility once he has insurance etc and a facility is found.    DVT prophylaxis:  enoxaparin  (LOVENOX ) injection 40 mg Start: 01/17/24 1000 SCDs Start: 10/27/23 2226   Code Status:   Code Status: Do not attempt resuscitation (DNR) PRE-ARREST INTERVENTIONS DESIRED  Mobility Assessment (Last 72 Hours)     Mobility Assessment     Row Name 02/12/24 1336 02/12/24 0743 02/11/24 2000 02/11/24 0730 02/10/24 1930   Does patient have an order for bedrest or is patient medically unstable -- Yes- Bedfast (Level 1) - Complete Yes- Bedfast (Level 1) - Complete Yes- Bedfast (Level 1) - Complete Yes- Bedfast (Level 1) - Complete   What is the highest level of mobility based on the progressive mobility assessment? Level 1 (Bedfast) - Unable to balance while sitting on edge of bed Level 1 (Bedfast) - Unable to balance while sitting on edge of bed Level 1 (Bedfast) - Unable to balance while sitting on edge of bed Level 1 (Bedfast) - Unable to balance while sitting on edge of bed Level 1 (Bedfast) - Unable to balance while sitting on edge of bed   Is the above level  different from baseline mobility prior to current illness? -- Yes - Recommend PT order Yes - Recommend PT order Yes - Recommend PT order Yes - Recommend PT order    Row Name 02/10/24 0800 02/09/24 1930         Does patient have an order for bedrest or is patient medically unstable Yes- Bedfast (Level 1) - Complete Yes- Bedfast (Level 1) - Complete      What is the highest level of mobility based on the progressive mobility assessment? Level 1 (Bedfast) - Unable to balance while sitting on edge of bed Level 1 (Bedfast) - Unable to balance while sitting on edge of bed      Is  the above level different from baseline mobility prior to current illness? Yes - Recommend PT order Yes - Recommend PT order         Barriers to discharge: Getting insurance Disposition Plan:  SNF once barriers resolved Status is: Inpt  Objective: Blood pressure 119/86, pulse 78, temperature 97.8 F (36.6 C), temperature source Oral, resp. rate 19, height 5' 8 (1.727 m), weight 92 kg, SpO2 98%.  Examination:  Physical Exam Constitutional:      Comments: Laying in bed appearing uncomfortable but no distress.  Occasionally opening eyes with no consistent meaningful purpose  HENT:     Head:     Comments: Long hair hanging off the back of the bed    Mouth/Throat:     Mouth: Mucous membranes are dry.   Eyes:     Comments: Intermittent right eye movement at times with no consistent tracking or blink reflex   Cardiovascular:     Rate and Rhythm: Normal rate and regular rhythm.  Pulmonary:     Comments: Abnormal respiratory pattern with occasional respiratory pauses Abdominal:     General: There is no distension.     Palpations: Abdomen is soft.   Musculoskeletal:        General: Normal range of motion.   Skin:    General: Skin is warm and dry.   Neurological:     Comments: Does not interact or follow commands.  Occasionally opens eyes with no meaningful purpose that can be repeated consistently.     Consultants:  Palliative care  Procedures:    Data Reviewed: Results for orders placed or performed during the hospital encounter of 10/27/23 (from the past 24 hours)  Glucose, capillary     Status: None   Collection Time: 02/11/24  5:30 PM  Result Value Ref Range   Glucose-Capillary 85 70 - 99 mg/dL   Comment 1 Notify RN    Comment 2 Document in Chart   Glucose, capillary     Status: Abnormal   Collection Time: 02/11/24  8:59 PM  Result Value Ref Range   Glucose-Capillary 137 (H) 70 - 99 mg/dL  Glucose, capillary     Status: Abnormal   Collection Time: 02/11/24  10:33 PM  Result Value Ref Range   Glucose-Capillary 104 (H) 70 - 99 mg/dL  Glucose, capillary     Status: Abnormal   Collection Time: 02/12/24  5:51 AM  Result Value Ref Range   Glucose-Capillary 113 (H) 70 - 99 mg/dL  Glucose, capillary     Status: Abnormal   Collection Time: 02/12/24  9:29 AM  Result Value Ref Range   Glucose-Capillary 151 (H) 70 - 99 mg/dL   Comment 1 Notify RN     I have reviewed pertinent nursing notes, vitals, labs, and images as  necessary. I have ordered labwork to follow up on as indicated.  I have reviewed the last notes from staff over past 24 hours. I have discussed patient's care plan and test results with nursing staff, CM/SW, and other staff as appropriate.    LOS: 108 days   Alm Apo, MD [facility] 02/12/2024, 2:07 PM

## 2024-02-12 NOTE — Progress Notes (Signed)
 Physical Therapy Treatment Patient Details Name: Garrett [name] MRN: [id number] DOB: 31-Aug-1975 Today's Date: 02/12/2024   History of Present Illness Pt is a 48 yo male who was found down 10/27/23 with agonal breathing. 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 Intubated 3/8, cortrak placed 3/14, trach placed 3/22, PEG placed 3/24. De-cannulated 6/10. PMH: HTN, smoker    PT Comments  Pt received in supine and is making slow progress towards therapy goals. Pt able to follow commands for L wrist and finger ROM with increased time. Pt requires max-total +2 to roll L/R, but is able to grip handrail with L hand to roll towards the R. Pt able to tolerate dependent transfer to the recliner via maximove and is assisted with positioning for upright posture. Pt continues to benefit from PT services to progress toward functional mobility goals.    If plan is discharge home, recommend the following: Two people to help with walking and/or transfers;Two people to help with bathing/dressing/bathroom;Assistance with cooking/housework;Assistance with feeding;Direct supervision/assist for medications management;Direct supervision/assist for financial management;Assist for transportation;Help with stairs or ramp for entrance;Supervision due to cognitive status   Can travel by private vehicle        Equipment Recommendations  [place] lift;Hospital bed;Wheelchair (measurements PT);Wheelchair cushion (measurements PT);BSC/3in1;Other (comment)    Recommendations for Other Services       Precautions / Restrictions Precautions Precautions: Fall Recall of Precautions/Restrictions: Impaired Precaution/Restrictions Comments: PEG, abdominal binder, SBP < 160 Required Braces or Orthoses: Other Brace Splint/Cast: Bil resting hand splints Other Brace:  has bilateral prevalon boots Restrictions Weight Bearing Restrictions Per Provider Order: No     Mobility  Bed Mobility Overal bed mobility: Needs Assistance Bed Mobility: Rolling Rolling: Max assist, +2 for physical assistance, Total assist         General bed mobility comments: Max A +2 to roll R and total A +2 to roll L to place lift pad. Pt able to hold rail with L hand with cues    Transfers Overall transfer level: Needs assistance Equipment used: Ambulation equipment used Transfers: Bed to chair/wheelchair/BSC               Transfer via Lift Equipment: Maximove  Ambulation/Gait                   Stairs             Wheelchair Mobility     Tilt Bed    Modified Rankin (Stroke Patients Only)       Balance Overall balance assessment: Needs assistance Sitting-balance support: Feet supported, No upper extremity supported Sitting balance-Leahy Scale: Zero                                      Communication Communication Communication: Impaired Factors Affecting Communication: Difficulty expressing self  Cognition Arousal: Alert Behavior During Therapy: Flat affect   PT - Cognitive impairments: Difficult to assess Difficult to assess due to: Impaired communication                       Following commands: Impaired Following commands impaired: Follows one step commands inconsistently, Follows one step commands with increased time    Cueing Cueing Techniques: Verbal cues, Visual cues  Exercises Other Exercises Other Exercises:  PROM B hip and knee flexion Other Exercises: AAROM L wrist and finger flexion and extension    General Comments        Pertinent Vitals/Pain Pain Assessment Pain Assessment: Faces Pain Score: 0-No pain     PT Goals (current goals can now be found in the care plan section) Acute Rehab PT Goals Patient Stated Goal: unable to state PT Goal Formulation: Patient unable to participate  in goal setting Time For Goal Achievement: 01/28/24 Progress towards PT goals: Progressing toward goals    Frequency    Min 2X/week       AM-PAC PT 6 Clicks Mobility   Outcome Measure  Help needed turning from your back to your side while in a flat bed without using bedrails?: Total Help needed moving from lying on your back to sitting on the side of a flat bed without using bedrails?: Total Help needed moving to and from a bed to a chair (including a wheelchair)?: Total Help needed standing up from a chair using your arms (e.g., wheelchair or bedside chair)?: Total Help needed to walk in hospital room?: Total Help needed climbing 3-5 steps with a railing? : Total 6 Click Score: 6    End of Session   Activity Tolerance: Patient tolerated treatment well Patient left: in chair;with call bell/phone within reach (no chair alarm, RN aware) Nurse Communication: Need for lift equipment PT Visit Diagnosis: Muscle weakness (generalized) (M62.81);Other symptoms and signs involving the nervous system (R29.898);Hemiplegia and hemiparesis Hemiplegia - Right/Left: Right Hemiplegia - dominant/non-dominant: Dominant Hemiplegia - caused by: Nontraumatic intracerebral hemorrhage     Time: 1101-1134 PT Time Calculation (min) (ACUTE ONLY): 33 min  Charges:    $Therapeutic Exercise: 8-22 mins $Therapeutic Activity: 8-22 mins PT General Charges $$ ACUTE PT VISIT: 1 Visit                     Darryle George, PTA Acute Rehabilitation Services Secure Chat Preferred  [phone]    Darryle George 02/12/2024, 1:39 PM

## 2024-02-12 NOTE — TOC Progression Note (Signed)
 Transition of Care [facility]) - Progression Note    Patient Details  Name: Garrett [name] MRN: [id number] Date of Birth: 05-12-1976  Transition [facility]) CM/SW Contact  Almarie CHRISTELLA Goodie, [name] Phone Number: 02/12/2024, 10:55 AM  Clinical Narrative:   CSW following for discharge to SNF. Patient pending disability. CSW contacted Linell Portugal [phone]), Medicaid caseworker at [employer], and left a voicemail to see if any updates on disability application. CSW also contacted [facility] and left a voicemail for Lorie [phone], [phone]), who is his caseworker through the [facility] for disability application. Awaiting call back.    Expected Discharge Plan: Long Term Nursing Home Barriers to Discharge: Continued Medical Work up, SNF Pending Medicaid, SNF Pending bed offer, SNF Pending payor source - LOG, Inadequate or no insurance (New trach)  Expected Discharge Plan and Services In-house Referral: Clinical Social Work, Hospice / Palliative Care   Post Acute Care Choice: Skilled Nursing Facility Living arrangements for the past 2 months: Single Family Home                                       Social Determinants of Health (SDOH) Interventions SDOH Screenings   Food Insecurity: Patient Unable To Answer (10/29/2023)  Social Connections: Unknown (06/23/2023)   Received from [facility]  Tobacco Use: High Risk (10/31/2023)    Readmission Risk Interventions     No data to display

## 2024-02-13 DIAGNOSIS — I619 Nontraumatic intracerebral hemorrhage, unspecified: Secondary | ICD-10-CM | POA: Diagnosis not present

## 2024-02-13 LAB — CBC
HCT: 37.8 % — ABNORMAL LOW (ref 39.0–52.0)
Hemoglobin: 11.8 g/dL — ABNORMAL LOW (ref 13.0–17.0)
MCH: 28.8 pg (ref 26.0–34.0)
MCHC: 31.2 g/dL (ref 30.0–36.0)
MCV: 92.2 fL (ref 80.0–100.0)
Platelets: 493 10*3/uL — ABNORMAL HIGH (ref 150–400)
RBC: 4.1 MIL/uL — ABNORMAL LOW (ref 4.22–5.81)
RDW: 14.9 % (ref 11.5–15.5)
WBC: 9.4 10*3/uL (ref 4.0–10.5)
nRBC: 0 % (ref 0.0–0.2)

## 2024-02-13 LAB — BASIC METABOLIC PANEL WITH GFR
Anion gap: 7 (ref 5–15)
BUN: 19 mg/dL (ref 6–20)
CO2: 30 mmol/L (ref 22–32)
Calcium: 9.2 mg/dL (ref 8.9–10.3)
Chloride: 99 mmol/L (ref 98–111)
Creatinine, Ser: 0.45 mg/dL — ABNORMAL LOW (ref 0.61–1.24)
GFR, Estimated: >60 mL/min (ref >60)
Glucose, Bld: 139 mg/dL — ABNORMAL HIGH (ref 70–99)
Potassium: 3.9 mmol/L (ref 3.5–5.1)
Sodium: 136 mmol/L (ref 135–145)

## 2024-02-13 LAB — GLUCOSE, CAPILLARY
Glucose-Capillary: 115 mg/dL — ABNORMAL HIGH (ref 70–99)
Glucose-Capillary: 99 mg/dL (ref 70–99)
Glucose-Capillary: 117 mg/dL — ABNORMAL HIGH (ref 70–99)
Glucose-Capillary: 85 mg/dL (ref 70–99)
Glucose-Capillary: 118 mg/dL — ABNORMAL HIGH (ref 70–99)
Glucose-Capillary: 102 mg/dL — ABNORMAL HIGH (ref 70–99)

## 2024-02-13 NOTE — Progress Notes (Signed)
 Speech Language Pathology Treatment: Cognitive-Linguistic  Patient Details Name: Garrett [name] MRN: [id number] DOB: 1976/03/12 Today's Date: 02/13/2024 Time: 8596-8570 SLP Time Calculation (min) (ACUTE ONLY): 26 min  Assessment / Plan / Recommendation Clinical Impression  Co-treated with OT to facilitate participation and potential communication.  Pt sat EOB with OT support.  Pt voiced spontaneously (sighing/yawning) on 4 occasions; unable to replicate with max cues.  Vertical eye gaze board and thumb movement/ball release were utilized to elicit yes/no responses. Today he produced fewer intentional responses and those that were elicited were less accurate (max visual/verbal cues provided).  SLP will continue to follow a min of 1x/week.      HPI HPI: Pt is a 48 yo male who was found down 10/27/23 with agonal breathing. 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 Intubated 3/8, cortrak placed 3/14, trach placed 3/22, PEG placed 3/24; decannulated. PMH: HTN, smoker      SLP Plan  Continue with current plan of care          Recommendations  Diet recommendations: NPO Medication Administration: Via alternative means                  Oral care QID;Staff/trained caregiver to provide oral care   Frequent or constant Supervision/Assistance Dysarthria and anarthria (R47.1);Cognitive communication deficit (R41.841)     Continue with current plan of care    Garrett [name] L. Vona, MA CCC/SLP Clinical Specialist - Acute Care SLP Acute Rehabilitation Services Office number [phone]  Garrett [name] Garrett [name]  02/13/2024, 3:09 PM

## 2024-02-13 NOTE — Progress Notes (Addendum)
 Progress Note    Garrett [name]   FMW:[id number]  DOB: 1976/05/26  DOA: 10/27/2023     109 PCP: Pcp, No  Initial CC: found unresponsive   Hospital Course: 48 year old with no known previous medical history was found down on 10/27/2023, unresponsive with UDS positive for opioids.  He was minimally responsive to Narcan  and he was found to be markedly hypertensive-  blood pressure 262/156.  CT head notable for 4.1 cm intracranial hemorrhage at the pons with intraventricular extension into the fourth ventricle and basal cisterns.  Initially admitted by critical care, and then extubated with tracheostomy, initially placed on 3/20, replaced on 01/05/2024.  PEG tube placed 11/08/2023.   Remains with poor response and poor prognosis at this time Now decannulated due to difficulty with passing suction catheter and being monitored for any decompensation.  PEG in place.    Significant hospital events: 3/8: Intubated in ED, pontine bleed/SAH. CT head Acute large hemorrhage 4.1 cm in pons with intraventricular extension without hydrocephalus. 3/9: CTA No change in IPH in pons, similar biparietal [facility] 3/14: Now awake, appears locked in syndrome.  Can follow commands with eyes. 3/16 Purposeful with left upper extremity  3/22 tracheostomy placed at bedside, bleeding issues overnight from trach site 3/24 PEG 3/24 Sputum culture with MRSA 4/20: trach was changed to cuffless #8 5/1: Mental status appears to be somewhat improving off narcotics, more awake alert following simple commands(able to grimace, move left toes, track vertically with right eye).  Speech following. Trach downsized to [employer] cuffless #6 XLT 5/4: Modafinil  held, low threshold to resume if mental status/ability to interact diminishes 5/10: PEG dislodged, foley bulb placed and IR consulted to replace tube 5/11: IR replaced PEG tube 5/13 - 5/14: + Fever.  Restarted antibiotics.  Urine culture + E. Coli, foley catheter changed. 5/17  Tracheostomy changed.   5/18: Completed antibiotics for UTI 5/23: Stable, no significant change, awaiting disability for placement. 6-11 Tracheostomy de cannulated 01/29/2024 after RT couldn't pass suction catheter.   No changes in condition.    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  CT head with acute large hemorrhage 4.1 cm in the pons, ventricular extension. blood pressure 260/1 50s on admission. Was admitted to the ICU and followed by Neuro.  - 2D echo ejection fraction 60%, A1c 4.7, LDL 96. -He was also treated with 3% hypertonic saline.  Follow-up CT head 3/29 with reduction in size of hemorrhage. - PEG dependent; poor prognosis - need repeat GOC discussions given no meaningful improvement now in 3+ months; POOR PROGNOSIS  Goals of care - Dr Tyrus Discussed with neurology on 6/18; given no major improvement since admission, his prognosis is considered poor at this time; he has no meaningful recovery nor QOL. - Family meeting held with mom and his fiance in person and his dad was on the phone on 02/09/2024; they have at least decided that he is NOT safe for returning home with 24/7 care and they would like to pursue placement once found.  They were not willing to consider de-escalation  of care despite discussion of poor prognosis and extremely low chance of any meaningful recovery   E. coli UTI  Chronic foley Completed antibiotics course 5/18 Foley catheter was changed 5/13. 6/12.  Needs to  exchange foley 7/12  Acute respiratory failure secondary to large pontine stroke brainstem compression Unable to protect airway/possible aspiration pneumonia - ABX completed  -De cannulated 6/11 after RT could pass suction catheter. CCM following, plan to monitor for now, may need tracheostomy in placed in future  per CCM - Per CCM if patient developed respiratory distress, inability to maintain his airway or trouble clearing secretions will need to have low threshold to reintubate. Chest x-ray 6/13 negative for PNA Remain stable since 6/11 He has remain stable.   Hypertension Continue Norvasc , Losartan , Lopressor  Continue with Hydralazine  BID   Transaminases Improved.  CT abdomen pelvis negative, acute hepatitis panel negative   Moderate to severe protein caloric malnutrition, POA Peg in place, TF per RD.    PEG tube malfunction, resolved PEG dislodged 5/10, replaced on 5/11 by IR   Exposure keratopathy, OS Seen by ophthalmology, Dr. Maree on 4/7.  Treated with maxitrol ointment (neo-poly-dex) TID OS for 1 week. -- Refresh PM TID to QID OS for chronic lubrication   AKI with acute urinary retention Resolved.  Has Foley in placed   Macrocytic anemia thrombocytosis  stable/resolved   Perineal skin injury, POA  Continue Gerhard's Butt cream   Relative B12 and folate deficiency Continue replacement with MVI    Nutrition Problem: Inadequate oral intake Etiology: inability to eat   Interventions: Tube feeding, Prostat, MVI, Juven Nutrition Problem: Inadequate oral intake Etiology: inability to eat Signs/Symptoms: NPO status Interventions: Tube feeding, Prostat, MVI, Juven   Estimated body mass index is 31.17 kg/m as calculated from the following:   Height as of this encounter: 5' 8 (1.727 m).   Weight as of this encounter: 93 kg.  Interval History:  Alert, not following command.   DVT prophylaxis:  enoxaparin  (LOVENOX ) injection 40 mg Start: 01/17/24 1000 SCDs  Start: 10/27/23 2226   Code Status:   Code Status: Do not attempt resuscitation (DNR) PRE-ARREST INTERVENTIONS DESIRED  Mobility Assessment (Last 72 Hours)     Mobility Assessment     Row Name 02/13/24 0800 02/12/24 2045 02/12/24 1336 02/12/24 0743 02/11/24 2000   Does patient have an order for bedrest or is patient medically unstable Yes- Bedfast (Level 1) - Complete Yes- Bedfast (Level 1) - Complete -- Yes- Bedfast (Level 1) - Complete Yes- Bedfast (Level 1) - Complete   What is the highest level of mobility based on the progressive mobility assessment? Level 1 (Bedfast) - Unable to balance while sitting on edge of bed Level 1 (Bedfast) - Unable to balance while sitting on edge of bed Level 1 (Bedfast) - Unable to balance while sitting on edge of bed Level 1 (Bedfast) - Unable to balance while sitting on edge of bed Level 1 (Bedfast) - Unable to balance while sitting on edge of bed   Is the above level different from baseline mobility prior to current illness? -- Yes - Recommend PT order -- Yes - Recommend PT order Yes - Recommend PT order    Row Name 02/11/24 0730 02/10/24 1930         Does patient have an order for bedrest or is patient medically unstable Yes- Bedfast (Level 1) - Complete Yes- Bedfast (Level 1) - Complete      What is the highest level of mobility  based on the progressive mobility assessment? Level 1 (Bedfast) - Unable to balance while sitting on edge of bed Level 1 (Bedfast) - Unable to balance while sitting on edge of bed      Is the above level different from baseline mobility prior to current illness? Yes - Recommend PT order Yes - Recommend PT order         Barriers to discharge: Getting insurance Disposition Plan:  SNF once barriers resolved Status is: Inpt  Objective: Blood pressure 133/89, pulse 68, temperature 97.9 F (36.6 C), temperature source Axillary, resp. rate 18, height 5' 8 (1.727 m), weight 92 kg, SpO2 96%.  Examination: Alert.  General;  NAD CVS; S 1, S 2 RRR Lungs: Normal respiratory effort Neuro: alert, does not follows command  Consultants:  Palliative care  Procedures:    Data Reviewed: Results for orders placed or performed during the hospital encounter of 10/27/23 (from the past 24 hours)  Glucose, capillary     Status: Abnormal   Collection Time: 02/12/24  6:57 PM  Result Value Ref Range   Glucose-Capillary 123 (H) 70 - 99 mg/dL  Glucose, capillary     Status: Abnormal   Collection Time: 02/12/24 10:27 PM  Result Value Ref Range   Glucose-Capillary 135 (H) 70 - 99 mg/dL  Glucose, capillary     Status: Abnormal   Collection Time: 02/13/24  2:22 AM  Result Value Ref Range   Glucose-Capillary 115 (H) 70 - 99 mg/dL  Glucose, capillary     Status: None   Collection Time: 02/13/24  6:06 AM  Result Value Ref Range   Glucose-Capillary 99 70 - 99 mg/dL  CBC     Status: Abnormal   Collection Time: 02/13/24  8:01 AM  Result Value Ref Range   WBC 9.4 4.0 - 10.5 K/uL   RBC 4.10 (L) 4.22 - 5.81 MIL/uL   Hemoglobin 11.8 (L) 13.0 - 17.0 g/dL   HCT 62.1 (L) 60.9 - 47.9 %   MCV 92.2 80.0 - 100.0 fL   MCH 28.8 26.0 - 34.0 pg   MCHC 31.2 30.0 - 36.0 g/dL   RDW 85.0 88.4 - 84.4 %   Platelets 493 (H) 150 - 400 K/uL   nRBC 0.0 0.0 - 0.2 %  Basic metabolic panel with GFR     Status: Abnormal   Collection Time: 02/13/24  8:01 AM  Result Value Ref Range   Sodium 136 135 - 145 mmol/L   Potassium 3.9 3.5 - 5.1 mmol/L   Chloride 99 98 - 111 mmol/L   CO2 30 22 - 32 mmol/L   Glucose, Bld 139 (H) 70 - 99 mg/dL   BUN 19 6 - 20 mg/dL   Creatinine, Ser 9.54 (L) 0.61 - 1.24 mg/dL   Calcium 9.2 8.9 - 89.6 mg/dL   GFR, Estimated >39 >39 mL/min   Anion gap 7 5 - 15  Glucose, capillary     Status: Abnormal   Collection Time: 02/13/24 10:09 AM  Result Value Ref Range   Glucose-Capillary 117 (H) 70 - 99 mg/dL  Glucose, capillary     Status: None   Collection Time: 02/13/24  2:18 PM  Result Value Ref Range    Glucose-Capillary 85 70 - 99 mg/dL        LOS: 890 days   Owen DELENA Lore, MD [facility] 02/13/2024, 2:38 PM

## 2024-02-13 NOTE — Progress Notes (Signed)
 Occupational Therapy Treatment Patient Details Name: Garrett [name] MRN: [id number] DOB: 08/10/1976 Today's Date: 02/13/2024   History of present illness Pt is a 48 yo male who was found down 10/27/23 with agonal breathing. 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 Intubated 3/8, cortrak placed 3/14, trach placed 3/22, PEG placed 3/24. De-cannulated 6/10. PMH: HTN, smoker   OT comments  Pt with poor return demo of yes no response with visual scanning and grasp/ release this session. Pt noted to have pauses in breathing with decreased saturations to 89 but with delay return to 95%. Pt noted to have some accessory breathing at eob. Pt overall with decreased engagement compared to prior known sessions with OT/SLP. Recommendation for skilled inpatient follow up therapy, <3 hours/day..       If plan is discharge home, recommend the following:  Two people to help with walking and/or transfers;Two people to help with bathing/dressing/bathroom;Direct supervision/assist for medications management;Direct supervision/assist for financial management;Assist for transportation;Help with stairs or ramp for entrance   Equipment Recommendations  Wheelchair (measurements OT);Wheelchair cushion (measurements OT);Hospital bed;Hoyer lift    Recommendations for Other Services Speech consult    Precautions / Restrictions Precautions Precautions: Fall Recall of Precautions/Restrictions: Impaired Precaution/Restrictions Comments: PEG, abdominal binder, SBP < 160 Required Braces or Orthoses: Other Brace Splint/Cast: Bil resting hand splints Splint/Cast - Date Prophylactic Dressing Applied (if applicable): 11/02/23 Other Brace: has bilateral prevalon boots Restrictions Weight Bearing Restrictions Per Provider Order: No       Mobility Bed  Mobility Overal bed mobility: Needs Assistance Bed Mobility: Rolling Rolling: +2 for physical assistance, Total assist, +2 for safety/equipment   Supine to sit: Total assist, +2 for physical assistance, +2 for safety/equipment Sit to supine: +2 for physical assistance, Total assist, +2 for safety/equipment        Transfers                   General transfer comment: bed level session     Balance       Sitting balance - Comments: total ()A                                   ADL either performed or assessed with clinical judgement   ADL                                         General ADL Comments: incontinence of stool, total (A) for all care    Extremity/Trunk Assessment Upper Extremity Assessment Upper Extremity Assessment: Right hand dominant;RUE deficits/detail;LUE deficits/detail RUE Deficits / Details: demonstrates with drawal this session. pt also demonstrates wrist extension and hand extension when moved due to startle response. not initiation on command LUE Deficits / Details: delayed response decrease grasp on ball this session   Lower Extremity Assessment Lower Extremity Assessment: Defer to PT evaluation        Vision   Additional Comments: increased eye bobbing this session but able to fixated gaze several times   Perception     Praxis     Communication Communication Communication: Impaired Factors Affecting Communication: Difficulty expressing self   Cognition Arousal: Alert Behavior During Therapy: Flat affect Cognition: Difficult to assess Difficult  to assess due to: Impaired communication           OT - Cognition Comments: poor return demonstration of yes/ no response this session. pt with increased time                 Following commands: Impaired Following commands impaired: Follows one step commands inconsistently, Follows one step commands with increased time      Cueing   Cueing  Techniques: Verbal cues, Tactile cues, Visual cues  Exercises Exercises: Other exercises Other Exercises Other Exercises: PROm hand wrist elbow BUE    Shoulder Instructions       General Comments Oxygen 89-95% during session. pt with moments of delayed breathing and accessory breathing . pt with stable HR.    Pertinent Vitals/ Pain       Pain Assessment Pain Assessment: No/denies pain  Home Living                                          Prior Functioning/Environment              Frequency  Min 1X/week        Progress Toward Goals  OT Goals(current goals can now be found in the care plan section)  Progress towards OT goals: Not progressing toward goals - comment  Acute Rehab OT Goals Patient Stated Goal: poor return demo but can participate with yes no OT Goal Formulation: Patient unable to participate in goal setting Time For Goal Achievement: 02/27/24 Potential to Achieve Goals: Fair ADL Goals Pt/caregiver will Perform Home Exercise Program: Increased ROM;Both right and left upper extremity Additional ADL Goal #1: Pt will follow 1 step commands with 50% accuracy in a non distracting environment. Additional ADL Goal #2: Pt will complete bed mobility with max assist +2, maintain sitting balance statically for 3 minutes with mod assist. Additional ADL Goal #3: Pt will use ball squeeze in L hand for accurate yes answer on 5/5 attempts.  Plan      Co-evaluation    PT/OT/SLP Co-Evaluation/Treatment: Yes Reason for Co-Treatment: Complexity of the patient's impairments (multi-system involvement);Necessary to address cognition/behavior during functional activity   OT goals addressed during session: ADL's and self-care SLP goals addressed during session: Communication;Cognition    AM-PAC OT 6 Clicks Daily Activity     Outcome Measure   Help from another person eating meals?: Total Help from another person taking care of personal grooming?:  Total Help from another person toileting, which includes using toliet, bedpan, or urinal?: Total Help from another person bathing (including washing, rinsing, drying)?: Total Help from another person to put on and taking off regular upper body clothing?: Total Help from another person to put on and taking off regular lower body clothing?: Total 6 Click Score: 6    End of Session    OT Visit Diagnosis: Other abnormalities of gait and mobility (R26.89);Muscle weakness (generalized) (M62.81);Other symptoms and signs involving the nervous system (R29.898);Other symptoms and signs involving cognitive function;Hemiplegia and hemiparesis Hemiplegia - caused by: Other Nontraumatic intracranial hemorrhage   Activity Tolerance Patient tolerated treatment well   Patient Left in bed;with call bell/phone within reach;with bed alarm set;with SCD's reapplied   Nurse Communication Mobility status;Precautions;Need for lift equipment        Time: 1400-1429 OT Time Calculation (min): 29 min  Charges: OT General Charges $OT Visit: 1 Visit OT Treatments $Self Care/Home  Management : 23-37 mins   Brynn, OTR/L  Acute Rehabilitation Services Office: [phone] .   Ely Molt 02/13/2024, 4:14 PM

## 2024-02-13 NOTE — Plan of Care (Signed)
  Problem: Nutrition: Goal: Risk of aspiration will decrease 02/13/2024 0329 by Peri Magdalena LABOR, RN Outcome: Progressing 02/13/2024 0328 by Peri Magdalena LABOR, RN Outcome: Progressing   Problem: Nutritional: Goal: Maintenance of adequate nutrition will improve 02/13/2024 0329 by Peri Magdalena LABOR, RN Outcome: Progressing 02/13/2024 0328 by Peri Magdalena LABOR, RN Outcome: Progressing   Problem: Skin Integrity: Goal: Risk for impaired skin integrity will decrease 02/13/2024 0329 by Peri Magdalena LABOR, RN Outcome: Progressing 02/13/2024 0328 by Peri Magdalena LABOR, RN Outcome: Progressing   Problem: Elimination: Goal: Will not experience complications related to bowel motility 02/13/2024 0329 by Peri Magdalena LABOR, RN Outcome: Progressing 02/13/2024 0328 by Peri Magdalena LABOR, RN Outcome: Progressing Goal: Will not experience complications related to urinary retention 02/13/2024 0329 by Peri Magdalena LABOR, RN Outcome: Progressing 02/13/2024 0328 by Peri Magdalena LABOR, RN Outcome: Progressing   Problem: Pain Managment: Goal: General experience of comfort will improve and/or be controlled 02/13/2024 0329 by Peri Magdalena LABOR, RN Outcome: Progressing 02/13/2024 0328 by Peri Magdalena LABOR, RN Outcome: Progressing   Problem: Safety: Goal: Ability to remain free from injury will improve 02/13/2024 0329 by Peri Magdalena LABOR, RN Outcome: Progressing 02/13/2024 0328 by Peri Magdalena LABOR, RN Outcome: Progressing   Problem: Skin Integrity: Goal: Risk for impaired skin integrity will decrease 02/13/2024 0329 by Peri Magdalena LABOR, RN Outcome: Progressing 02/13/2024 0328 by Peri Magdalena LABOR, RN Outcome: Progressing

## 2024-02-13 NOTE — Plan of Care (Signed)
  Problem: Nutrition: Goal: Dietary intake will improve Outcome: Progressing   Problem: Education: Goal: Knowledge of disease or condition will improve Outcome: Not Progressing Goal: Knowledge of secondary prevention will improve (MUST DOCUMENT ALL) Outcome: Not Progressing Goal: Knowledge of patient specific risk factors will improve (DELETE if not current risk factor) Outcome: Not Progressing   Problem: Intracerebral Hemorrhage Tissue Perfusion: Goal: Complications of Intracerebral Hemorrhage will be minimized Outcome: Not Progressing   Problem: Coping: Goal: Will verbalize positive feelings about self Outcome: Not Progressing Goal: Will identify appropriate support needs Outcome: Not Progressing   Problem: Health Behavior/Discharge Planning: Goal: Ability to manage health-related needs will improve Outcome: Not Progressing Goal: Goals will be collaboratively established with patient/family Outcome: Not Progressing   Problem: Self-Care: Goal: Ability to participate in self-care as condition permits will improve Outcome: Not Progressing Goal: Verbalization of feelings and concerns over difficulty with self-care will improve Outcome: Not Progressing Goal: Ability to communicate needs accurately will improve Outcome: Not Progressing   Problem: Nutrition: Goal: Risk of aspiration will decrease Outcome: Not Progressing   Problem: Education: Goal: Ability to describe self-care measures that may prevent or decrease complications (Diabetes Survival Skills Education) will improve Outcome: Not Progressing   Problem: Coping: Goal: Ability to adjust to condition or change in health will improve Outcome: Not Progressing

## 2024-02-14 DIAGNOSIS — I619 Nontraumatic intracerebral hemorrhage, unspecified: Secondary | ICD-10-CM | POA: Diagnosis not present

## 2024-02-14 LAB — GLUCOSE, CAPILLARY
Glucose-Capillary: 99 mg/dL (ref 70–99)
Glucose-Capillary: 122 mg/dL — ABNORMAL HIGH (ref 70–99)
Glucose-Capillary: 111 mg/dL — ABNORMAL HIGH (ref 70–99)
Glucose-Capillary: 86 mg/dL (ref 70–99)
Glucose-Capillary: 127 mg/dL — ABNORMAL HIGH (ref 70–99)

## 2024-02-14 NOTE — Plan of Care (Signed)
  Problem: Nutrition: Goal: Dietary intake will improve Outcome: Progressing   Problem: Metabolic: Goal: Ability to maintain appropriate glucose levels will improve Outcome: Progressing   Problem: Education: Goal: Knowledge of disease or condition will improve Outcome: Not Progressing Goal: Knowledge of secondary prevention will improve (MUST DOCUMENT ALL) Outcome: Not Progressing Goal: Knowledge of patient specific risk factors will improve (DELETE if not current risk factor) Outcome: Not Progressing   Problem: Intracerebral Hemorrhage Tissue Perfusion: Goal: Complications of Intracerebral Hemorrhage will be minimized Outcome: Not Progressing   Problem: Coping: Goal: Will verbalize positive feelings about self Outcome: Not Progressing   Problem: Health Behavior/Discharge Planning: Goal: Ability to manage health-related needs will improve Outcome: Not Progressing   Problem: Self-Care: Goal: Ability to participate in self-care as condition permits will improve Outcome: Not Progressing Goal: Verbalization of feelings and concerns over difficulty with self-care will improve Outcome: Not Progressing Goal: Ability to communicate needs accurately will improve Outcome: Not Progressing   Problem: Nutrition: Goal: Risk of aspiration will decrease Outcome: Not Progressing   Problem: Health Behavior/Discharge Planning: Goal: Ability to manage health-related needs will improve Outcome: Not Progressing   Problem: Skin Integrity: Goal: Risk for impaired skin integrity will decrease Outcome: Not Progressing

## 2024-02-14 NOTE — Progress Notes (Signed)
 Progress Note    Garrett [name]   FMW:[id number]  DOB: 1976/03/14  DOA: 10/27/2023     110 PCP: Pcp, No  Initial CC: found unresponsive   Hospital Course: 48 year old with no known previous medical history was found down on 10/27/2023, unresponsive with UDS positive for opioids.  He was minimally responsive to Narcan  and he was found to be markedly hypertensive-  blood pressure 262/156.  CT head notable for 4.1 cm intracranial hemorrhage at the pons with intraventricular extension into the fourth ventricle and basal cisterns.  Initially admitted by critical care, and then extubated with tracheostomy, initially placed on 3/20, replaced on 01/05/2024.  PEG tube placed 11/08/2023.   Remains with poor response and poor prognosis at this time Now decannulated due to difficulty with passing suction catheter and being monitored for any decompensation.  PEG in place.    Significant hospital events: 3/8: Intubated in ED, pontine bleed/SAH. CT head Acute large hemorrhage 4.1 cm in pons with intraventricular extension without hydrocephalus. 3/9: CTA No change in IPH in pons, similar biparietal [facility] 3/14: Now awake, appears locked in syndrome.  Can follow commands with eyes. 3/16 Purposeful with left upper extremity  3/22 tracheostomy placed at bedside, bleeding issues overnight from trach site 3/24 PEG 3/24 Sputum culture with MRSA 4/20: trach was changed to cuffless #8 5/1: Mental status appears to be somewhat improving off narcotics, more awake alert following simple commands(able to grimace, move left toes, track vertically with right eye).  Speech following. Trach downsized to [employer] cuffless #6 XLT 5/4: Modafinil  held, low threshold to resume if mental status/ability to interact diminishes 5/10: PEG dislodged, foley bulb placed and IR consulted to replace tube 5/11: IR replaced PEG tube 5/13 - 5/14: + Fever.  Restarted antibiotics.  Urine culture + E. Coli, foley catheter changed. 5/17  Tracheostomy changed.   5/18: Completed antibiotics for UTI 5/23: Stable, no significant change, awaiting disability for placement. 6-11 Tracheostomy de cannulated 01/29/2024 after RT couldn't pass suction catheter.   No changes in condition.    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  CT head with acute large hemorrhage 4.1 cm in the pons, ventricular extension. blood pressure 260/1 50s on admission. Was admitted to the ICU and followed by Neuro.  - 2D echo ejection fraction 60%, A1c 4.7, LDL 96. -He was also treated with 3% hypertonic saline.  Follow-up CT head 3/29 with reduction in size of hemorrhage. - PEG dependent; poor prognosis - need repeat GOC discussions given no meaningful improvement now in 3+ months; POOR PROGNOSIS  Goals of care - Dr Patsy Discussed with neurology on 6/18; given no major improvement since admission, his prognosis is considered poor at this time; he has no meaningful recovery nor QOL. - Family meeting held with Dr Patsy mom and his fiance in person and his dad was on the phone on 02/09/2024; they have at least decided that he is NOT safe for returning home with 24/7 care and they would like to pursue placement once found.  They were not willing to  consider de-escalation of care despite discussion of poor prognosis and extremely low chance of any meaningful recovery   E. coli UTI  Chronic foley Completed antibiotics course 5/18 Foley catheter was changed 5/13. 6/12.   Needs to exchange foley 7/12  Acute respiratory failure secondary to large pontine stroke brainstem compression Unable to protect airway/possible aspiration pneumonia - ABX completed  -De cannulated 6/11 after RT could pass suction catheter. CCM following, plan to monitor for now, may need tracheostomy in placed in future  per CCM - Per CCM if patient developed respiratory distress, inability to maintain his airway or trouble clearing secretions will need to have low threshold to reintubate. Chest x-ray 6/13 negative for PNA Remain stable since 6/11 He has remain stable.   Hypertension Continue Norvasc , Losartan , Lopressor  Continue with Hydralazine  BID   Transaminases Improved.  CT abdomen pelvis negative, acute hepatitis panel negative   Moderate to severe protein caloric malnutrition, POA Peg in place, TF per RD.    PEG tube malfunction, resolved PEG dislodged 5/10, replaced on 5/11 by IR   Exposure keratopathy, OS Seen by ophthalmology, Dr. Maree on 4/7.  Treated with maxitrol ointment (neo-poly-dex) TID OS for 1 week. -- Refresh PM TID to QID OS for chronic lubrication   AKI with acute urinary retention Resolved.  Has Foley in placed   Macrocytic anemia thrombocytosis  stable/resolved   Perineal skin injury, POA  Continue Gerhard's Butt cream   Relative B12 and folate deficiency Continue replacement with MVI    Stable, no changes  Nutrition Problem: Inadequate oral intake Etiology: inability to eat   Interventions: Tube feeding, Prostat, MVI, Juven Nutrition Problem: Inadequate oral intake Etiology: inability to eat Signs/Symptoms: NPO status Interventions: Tube feeding, Prostat, MVI, Juven   Estimated body mass index is 31.17 kg/m as calculated from the following:   Height as of this encounter: 5' 8 (1.727 m).   Weight as of this encounter: 93 kg.  Interval History:  Alert, not following command.   DVT prophylaxis:  enoxaparin  (LOVENOX ) injection 40  mg Start: 01/17/24 1000 SCDs Start: 10/27/23 2226   Code Status:   Code Status: Do not attempt resuscitation (DNR) PRE-ARREST INTERVENTIONS DESIRED  Mobility Assessment (Last 72 Hours)     Mobility Assessment     Row Name 02/14/24 0800 02/14/24 0400 02/14/24 0000 02/13/24 2120 02/13/24 2000   Does patient have an order for bedrest or is patient medically unstable Yes- Bedfast (Level 1) - Complete Yes- Bedfast (Level 1) - Complete Yes- Bedfast (Level 1) - Complete Yes- Bedfast (Level 1) - Complete Yes- Bedfast (Level 1) - Complete   What is the highest level of mobility based on the progressive mobility assessment? Level 1 (Bedfast) - Unable to balance while sitting on edge of bed Level 1 (Bedfast) - Unable to balance while sitting on edge of bed Level 1 (Bedfast) - Unable to balance while sitting on edge of bed Level 1 (Bedfast) - Unable to balance while sitting on edge of bed Level 1 (Bedfast) - Unable to balance while sitting on edge of bed   Is the above level different from baseline mobility prior to current illness? -- Yes - Recommend PT order Yes - Recommend PT order Yes - Recommend PT order Yes - Recommend PT order    Row Name 02/13/24 1600 02/13/24 0800 02/12/24 2045 02/12/24 1336 02/12/24 0743   Does patient have an order for bedrest or is patient medically unstable -- Yes- Bedfast (Level 1) - Complete Yes- Bedfast (  Level 1) - Complete -- Yes- Bedfast (Level 1) - Complete   What is the highest level of mobility based on the progressive mobility assessment? Level 1 (Bedfast) - Unable to balance while sitting on edge of bed Level 1 (Bedfast) - Unable to balance while sitting on edge of bed Level 1 (Bedfast) - Unable to balance while sitting on edge of bed Level 1 (Bedfast) - Unable to balance while sitting on edge of bed Level 1 (Bedfast) - Unable to balance while sitting on edge of bed   Is the above level different from baseline mobility prior to current illness? -- -- Yes - Recommend PT  order -- Yes - Recommend PT order    Row Name 02/11/24 2000           Does patient have an order for bedrest or is patient medically unstable Yes- Bedfast (Level 1) - Complete       What is the highest level of mobility based on the progressive mobility assessment? Level 1 (Bedfast) - Unable to balance while sitting on edge of bed       Is the above level different from baseline mobility prior to current illness? Yes - Recommend PT order          Barriers to discharge: Getting insurance Disposition Plan:  SNF once barriers resolved Status is: Inpt  Objective: Blood pressure (!) 139/97, pulse 75, temperature 97.9 F (36.6 C), temperature source Oral, resp. rate 16, height 5' 8 (1.727 m), weight 92 kg, SpO2 99%.  Examination: Alert.  General; NAD CVS; S 1, S 2 RRR Lungs: Normal respiratory effort Neuro: alert, does not follows command  Consultants:  Palliative care  Procedures:    Data Reviewed: Results for orders placed or performed during the hospital encounter of 10/27/23 (from the past 24 hours)  Glucose, capillary     Status: None   Collection Time: 02/13/24  2:18 PM  Result Value Ref Range   Glucose-Capillary 85 70 - 99 mg/dL  Glucose, capillary     Status: Abnormal   Collection Time: 02/13/24  5:32 PM  Result Value Ref Range   Glucose-Capillary 118 (H) 70 - 99 mg/dL  Glucose, capillary     Status: Abnormal   Collection Time: 02/13/24  9:05 PM  Result Value Ref Range   Glucose-Capillary 102 (H) 70 - 99 mg/dL   Comment 1 Notify RN    Comment 2 Document in Chart   Glucose, capillary     Status: None   Collection Time: 02/14/24  5:25 AM  Result Value Ref Range   Glucose-Capillary 99 70 - 99 mg/dL  Glucose, capillary     Status: Abnormal   Collection Time: 02/14/24  9:58 AM  Result Value Ref Range   Glucose-Capillary 122 (H) 70 - 99 mg/dL  Glucose, capillary     Status: Abnormal   Collection Time: 02/14/24  1:30 PM  Result Value Ref Range    Glucose-Capillary 111 (H) 70 - 99 mg/dL        LOS: 889 days   Owen DELENA Lore, MD [facility] 02/14/2024, 1:54 PM

## 2024-02-14 NOTE — Plan of Care (Signed)
  Problem: Education: Goal: Knowledge of disease or condition will improve Outcome: Progressing Goal: Knowledge of secondary prevention will improve (MUST DOCUMENT ALL) Outcome: Progressing Goal: Knowledge of patient specific risk factors will improve (DELETE if not current risk factor) Outcome: Progressing   Problem: Intracerebral Hemorrhage Tissue Perfusion: Goal: Complications of Intracerebral Hemorrhage will be minimized Outcome: Progressing   Problem: Coping: Goal: Will verbalize positive feelings about self Outcome: Progressing Goal: Will identify appropriate support needs Outcome: Progressing   Problem: Health Behavior/Discharge Planning: Goal: Ability to manage health-related needs will improve Outcome: Progressing Goal: Goals will be collaboratively established with patient/family Outcome: Progressing   Problem: Self-Care: Goal: Ability to participate in self-care as condition permits will improve Outcome: Progressing Goal: Verbalization of feelings and concerns over difficulty with self-care will improve Outcome: Progressing Goal: Ability to communicate needs accurately will improve Outcome: Progressing   Problem: Nutrition: Goal: Risk of aspiration will decrease Outcome: Progressing Goal: Dietary intake will improve Outcome: Progressing   Problem: Education: Goal: Ability to describe self-care measures that may prevent or decrease complications (Diabetes Survival Skills Education) will improve Outcome: Progressing   Problem: Coping: Goal: Ability to adjust to condition or change in health will improve Outcome: Progressing   Problem: Fluid Volume: Goal: Ability to maintain a balanced intake and output will improve Outcome: Progressing   Problem: Health Behavior/Discharge Planning: Goal: Ability to identify and utilize available resources and services will improve Outcome: Progressing Goal: Ability to manage health-related needs will improve Outcome:  Progressing   Problem: Metabolic: Goal: Ability to maintain appropriate glucose levels will improve Outcome: Progressing   Problem: Nutritional: Goal: Maintenance of adequate nutrition will improve Outcome: Progressing Goal: Progress toward achieving an optimal weight will improve Outcome: Progressing   Problem: Skin Integrity: Goal: Risk for impaired skin integrity will decrease Outcome: Progressing   Problem: Tissue Perfusion: Goal: Adequacy of tissue perfusion will improve Outcome: Progressing   Problem: Education: Goal: Knowledge of General Education information will improve Description: Including pain rating scale, medication(s)/side effects and non-pharmacologic comfort measures Outcome: Progressing   Problem: Health Behavior/Discharge Planning: Goal: Ability to manage health-related needs will improve Outcome: Progressing   Problem: Clinical Measurements: Goal: Ability to maintain clinical measurements within normal limits will improve Outcome: Progressing Goal: Will remain free from infection Outcome: Progressing Goal: Diagnostic test results will improve Outcome: Progressing Goal: Respiratory complications will improve Outcome: Progressing Goal: Cardiovascular complication will be avoided Outcome: Progressing   Problem: Activity: Goal: Risk for activity intolerance will decrease Outcome: Progressing   Problem: Nutrition: Goal: Adequate nutrition will be maintained Outcome: Progressing   Problem: Coping: Goal: Level of anxiety will decrease Outcome: Progressing   Problem: Elimination: Goal: Will not experience complications related to bowel motility Outcome: Progressing Goal: Will not experience complications related to urinary retention Outcome: Progressing   Problem: Pain Managment: Goal: General experience of comfort will improve and/or be controlled Outcome: Progressing   Problem: Safety: Goal: Ability to remain free from injury will  improve Outcome: Progressing   Problem: Skin Integrity: Goal: Risk for impaired skin integrity will decrease Outcome: Progressing   Problem: Education: Goal: 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 Outcome: Progressing   Problem: Activity: Goal: Ability to tolerate increased activity will improve Outcome: Progressing   Problem: Health Behavior/Discharge Planning: Goal: Ability to manage tracheostomy will improve Outcome: Progressing   Problem: Respiratory: Goal: Patent airway maintenance will improve Outcome: Progressing   Problem: Role Relationship: Goal: Ability to communicate will improve Outcome: Progressing

## 2024-02-14 NOTE — Progress Notes (Signed)
 Physical Therapy Treatment Patient Details Name: Garrett [name] MRN: [id number] DOB: [birth year] Today's Date: 02/14/2024   History of Present Illness Pt is a 48 yo male who was found down 10/27/23 with agonal breathing. 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 Intubated 3/8, cortrak placed 3/14, trach placed 3/22, PEG placed 3/24. De-cannulated 6/10. PMH: HTN, smoker   PT Comments  Pt in bed upon arrival and making slow progress towards acute PT goals. Pt was able to laterally flex head to the left at beginning of session, however, was unable to maintain upright position. Pt was unable to follow commands for L wrist/finger ROM during the session with x1 squeeze of L hand at end of session. TotalAx2 for bed mobility with TotalA required to support balance. Decreasing PT frequency as pt has been making limited progress towards goals. Will continue to follow acutely to improve mobility, decrease caregiver burden and provide education. Continue to recommend <3hrs post acute rehab.    If plan is discharge home, recommend the following: Two people to help with walking and/or transfers;Two people to help with bathing/dressing/bathroom;Assistance with cooking/housework;Assistance with feeding;Direct supervision/assist for medications management;Direct supervision/assist for financial management;Assist for transportation;Help with stairs or ramp for entrance;Supervision due to cognitive status   Can travel by private vehicle      No  Equipment Recommendations  Hoyer lift;Hospital bed;Wheelchair (measurements PT);Wheelchair cushion (measurements PT);BSC/3in1;Other (comment)       Precautions / Restrictions Precautions Precautions: Fall Recall of Precautions/Restrictions: Impaired Precaution/Restrictions Comments: PEG, abdominal binder,  SBP < 160 Required Braces or Orthoses: Other Brace Splint/Cast: Bil resting hand splints Splint/Cast - Date Prophylactic Dressing Applied (if applicable): 11/02/23 Restrictions Weight Bearing Restrictions Per Provider Order: No     Mobility  Bed Mobility Overal bed mobility: Needs Assistance Bed Mobility: Supine to Sit, Sit to Supine   Sidelying to sit: Total assist, +2 for physical assistance, +2 for safety/equipment Supine to sit: Total assist, +2 for physical assistance, +2 for safety/equipment    General bed mobility comments: TotalAx2 with use of bed pad. Pt did not initiate any movement       Modified Rankin (Stroke Patients Only) Modified Rankin (Stroke Patients Only) Pre-Morbid Rankin Score: No symptoms Modified Rankin: Severe disability     Balance Overall balance assessment: Needs assistance Sitting-balance support: Feet supported, No upper extremity supported Sitting balance-Leahy Scale: Zero Sitting balance - Comments: TotalA posteriorly Postural control: Posterior lean       Communication Communication Communication: Impaired Factors Affecting Communication: Difficulty expressing self  Cognition Arousal: Alert Behavior During Therapy: Flat affect   PT - Cognitive impairments: Difficult to assess Difficult to assess due to: Impaired communication    PT - Cognition Comments: Will track family and PT with cues. Was unable to communicate via hand squeeze or by nodding Following commands: Impaired Following commands impaired: Follows one step commands inconsistently, Follows one step commands with increased time    Cueing Cueing Techniques: Verbal cues, Tactile cues, Visual cues  Exercises General Exercises - Upper Extremity Shoulder Flexion: PROM, Both, 5 reps, Seated Shoulder ABduction: PROM, Both, 5 reps, Seated Shoulder Horizontal ABduction: PROM, Both, 5 reps, Seated Shoulder Horizontal ADduction: PROM, Both, 5 reps, Seated General Exercises - Lower  Extremity Ankle Circles/Pumps: PROM, Both, 5 reps, Seated Long Arc Quad: PROM, Both, 5 reps, Seated Hip Flexion/Marching: PROM, Both, 5 reps, Seated Other Exercises Other  Exercises: x5 lateral leans to L shoulder Other Exercises: x5 trunk rotations, PROM with TotalAx2 Other Exercises: seated head rotation    General Comments General comments (skin integrity, edema, etc.): Spouse present and supportive. SpO2 >94% on RA      Pertinent Vitals/Pain Pain Assessment Breathing: occasional labored breathing, short period of hyperventilation Negative Vocalization: none Facial Expression: smiling or inexpressive Body Language: relaxed Consolability: no need to console PAINAD Score: 1     PT Goals (current goals can now be found in the care plan section) Acute Rehab PT Goals Patient Stated Goal: unable to state PT Goal Formulation: Patient unable to participate in goal setting Time For Goal Achievement: 02/28/24 Potential to Achieve Goals: Fair Progress towards PT goals: Not progressing toward goals - comment (limited progress towards goals)    Frequency    Min 1X/week       AM-PAC PT 6 Clicks Mobility   Outcome Measure  Help needed turning from your back to your side while in a flat bed without using bedrails?: Total Help needed moving from lying on your back to sitting on the side of a flat bed without using bedrails?: Total Help needed moving to and from a bed to a chair (including a wheelchair)?: Total Help needed standing up from a chair using your arms (e.g., wheelchair or bedside chair)?: Total Help needed to walk in hospital room?: Total Help needed climbing 3-5 steps with a railing? : Total 6 Click Score: 6    End of Session   Activity Tolerance: Patient tolerated treatment well Patient left: in bed;with call bell/phone within reach;with family/visitor present Nurse Communication: Mobility status PT Visit Diagnosis: Muscle weakness (generalized) (M62.81);Other  symptoms and signs involving the nervous system (R29.898);Hemiplegia and hemiparesis Hemiplegia - Right/Left: Right Hemiplegia - dominant/non-dominant: Dominant Hemiplegia - caused by: Nontraumatic intracerebral hemorrhage     Time: 8665-8595 PT Time Calculation (min) (ACUTE ONLY): 30 min  Charges:    $Therapeutic Activity: 23-37 mins PT General Charges $$ ACUTE PT VISIT: 1 Visit                     Garrett ORN, PT, DPT Secure Chat Preferred  Rehab Office [phone]    Garrett [name] 02/14/2024, 3:10 PM

## 2024-02-15 ENCOUNTER — Inpatient Hospital Stay (HOSPITAL_COMMUNITY)

## 2024-02-15 DIAGNOSIS — I613 Nontraumatic intracerebral hemorrhage in brain stem: Secondary | ICD-10-CM | POA: Diagnosis not present

## 2024-02-15 DIAGNOSIS — Z930 Tracheostomy status: Secondary | ICD-10-CM | POA: Diagnosis not present

## 2024-02-15 DIAGNOSIS — J9601 Acute respiratory failure with hypoxia: Secondary | ICD-10-CM | POA: Diagnosis not present

## 2024-02-15 DIAGNOSIS — I619 Nontraumatic intracerebral hemorrhage, unspecified: Secondary | ICD-10-CM | POA: Diagnosis not present

## 2024-02-15 LAB — BASIC METABOLIC PANEL WITH GFR
Anion gap: 13 (ref 5–15)
BUN: 25 mg/dL — ABNORMAL HIGH (ref 6–20)
CO2: 26 mmol/L (ref 22–32)
Calcium: 9.4 mg/dL (ref 8.9–10.3)
Chloride: 99 mmol/L (ref 98–111)
Creatinine, Ser: 0.53 mg/dL — ABNORMAL LOW (ref 0.61–1.24)
GFR, Estimated: >60 mL/min (ref >60)
Glucose, Bld: 136 mg/dL — ABNORMAL HIGH (ref 70–99)
Potassium: 4.3 mmol/L (ref 3.5–5.1)
Sodium: 138 mmol/L (ref 135–145)

## 2024-02-15 LAB — BLOOD GAS, ARTERIAL
Acid-Base Excess: 10.5 mmol/L — ABNORMAL HIGH (ref 0.0–2.0)
Bicarbonate: 37.8 mmol/L — ABNORMAL HIGH (ref 20.0–28.0)
Drawn by: 23604
O2 Saturation: 99.1 %
Patient temperature: 37
pCO2 arterial: 61 mmHg — ABNORMAL HIGH (ref 32–48)
pH, Arterial: 7.4 (ref 7.35–7.45)
pO2, Arterial: 93 mmHg (ref 83–108)

## 2024-02-15 LAB — EXPECTORATED SPUTUM ASSESSMENT W GRAM STAIN, RFLX TO RESP C

## 2024-02-15 LAB — CBC
HCT: 39.2 % (ref 39.0–52.0)
Hemoglobin: 11.9 g/dL — ABNORMAL LOW (ref 13.0–17.0)
MCH: 28.4 pg (ref 26.0–34.0)
MCHC: 30.4 g/dL (ref 30.0–36.0)
MCV: 93.6 fL (ref 80.0–100.0)
Platelets: 510 10*3/uL — ABNORMAL HIGH (ref 150–400)
RBC: 4.19 MIL/uL — ABNORMAL LOW (ref 4.22–5.81)
RDW: 15.3 % (ref 11.5–15.5)
WBC: 15.4 10*3/uL — ABNORMAL HIGH (ref 4.0–10.5)
nRBC: 0 % (ref 0.0–0.2)

## 2024-02-15 LAB — GLUCOSE, CAPILLARY
Glucose-Capillary: 139 mg/dL — ABNORMAL HIGH (ref 70–99)
Glucose-Capillary: 148 mg/dL — ABNORMAL HIGH (ref 70–99)
Glucose-Capillary: 158 mg/dL — ABNORMAL HIGH (ref 70–99)
Glucose-Capillary: 103 mg/dL — ABNORMAL HIGH (ref 70–99)
Glucose-Capillary: 162 mg/dL — ABNORMAL HIGH (ref 70–99)

## 2024-02-15 MED ORDER — SODIUM CHLORIDE 3 % IN NEBU
4.0000 mL | INHALATION_SOLUTION | Freq: Two times a day (BID) | RESPIRATORY_TRACT | Status: AC
  Administered 2024-02-16 – 2024-02-18 (×5): 4 mL via RESPIRATORY_TRACT
  Filled 2024-02-15 (×6): qty 4

## 2024-02-15 MED ORDER — SODIUM CHLORIDE 0.9 % IV SOLN
3.0000 g | Freq: Four times a day (QID) | INTRAVENOUS | Status: DC
  Administered 2024-02-15 – 2024-02-16 (×5): 3 g via INTRAVENOUS
  Filled 2024-02-15 (×5): qty 8

## 2024-02-15 MED ORDER — FUROSEMIDE 10 MG/ML IJ SOLN
INTRAMUSCULAR | Status: AC
  Filled 2024-02-15: qty 4

## 2024-02-15 MED ORDER — IPRATROPIUM-ALBUTEROL 0.5-2.5 (3) MG/3ML IN SOLN
3.0000 mL | Freq: Four times a day (QID) | RESPIRATORY_TRACT | Status: DC
  Administered 2024-02-15 – 2024-02-24 (×31): 3 mL via RESPIRATORY_TRACT
  Filled 2024-02-15 (×31): qty 3

## 2024-02-15 MED ORDER — FUROSEMIDE 10 MG/ML IJ SOLN
40.0000 mg | Freq: Once | INTRAMUSCULAR | Status: AC
  Administered 2024-02-15: 40 mg via INTRAVENOUS

## 2024-02-15 NOTE — Plan of Care (Signed)
  Problem: Intracerebral Hemorrhage Tissue Perfusion: Goal: Complications of Intracerebral Hemorrhage will be minimized Outcome: Progressing   Problem: Health Behavior/Discharge Planning: Goal: Goals will be collaboratively established with patient/family Outcome: Progressing   Problem: Fluid Volume: Goal: Ability to maintain a balanced intake and output will improve Outcome: Progressing   Problem: Metabolic: Goal: Ability to maintain appropriate glucose levels will improve Outcome: Progressing   Problem: Nutritional: Goal: Maintenance of adequate nutrition will improve Outcome: Progressing   Problem: Skin Integrity: Goal: Risk for impaired skin integrity will decrease Outcome: Progressing   Problem: Tissue Perfusion: Goal: Adequacy of tissue perfusion will improve Outcome: Progressing   Problem: Clinical Measurements: Goal: Diagnostic test results will improve Outcome: Progressing   Problem: Nutrition: Goal: Adequate nutrition will be maintained Outcome: Progressing

## 2024-02-15 NOTE — Progress Notes (Signed)
 NAME:  Garrett [name], MRN:  [id number], DOB:  1976/07/21, LOS: 111 ADMISSION DATE:  10/27/2023, CONSULTATION DATE:  10/27/2023 REFERRING MD:  Roxie Bough, DO CHIEF COMPLAINT:  Found Down  History of Present Illness:  48 y/o male with past medical history of hypertension who was found down for unknown downtime by his fiance when she came home from work. Reportedly, having agonal breathing.  He apparently had driven her to work this morning.  He has HTN but never checks his BP and does not follow with MD.  She says you can tell when his BP is really high; his eyes get blood shot and he is sweating.  No h/o seizures.  Besides almost daily [name] and cigarette smoking, she is not aware of any other drugs. Drug screen positive for opioids and he was given several doses of Narcan . Patient found to have:  1. 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 Basal cisterns and fourth ventricle are effaced without hydrocephalus at this time. 2. Trace additional subarachnoid hemorrhage along the left parietal convexity. BP in ED 262/156. His CXR revealed a RUL and perihilar infiltrates suggesting aspiration pneumonia. ED labs also revealed PH 7.169, LA 4.4, CPK 985. Patient was intubated in ED.  Pertinent  Medical History  hypertension  Significant Hospital Events: Including procedures, antibiotic start and stop dates in addition to other pertinent events   3/8: Intubated in ED, pontine bleed/SAH. CT head 17:09 Acute large hemorrhage 4.1 cm in pons with intraventricular extension without hydrocephalus 3/9: CTA 03:14 AM No change in IPH in pons, similar biparietal [facility] 3/14: Now awake, appears locked in can follow commands with eyes. 3/16 Purposeful with left upper extremity  3/22 tracheostomy placed at bedside, bleeding issues overnight from trach site 3/24 PEG (Dr. Sebastian) 3/24  sputum culture with MRSA 3/27 vomited. TF on hold. Abd film c/w ileus. Added reglan , got SSE. Did tolerate PSV all day  3/28 BMs x2 after SSE day prior. Added back TFs at 1/2 rated. Tolerated PS 3/29 Tolerated PS  3/31 Tolerating CPAP PS 15/5, TF on hold due to ileus. 4/1: con't to hold tube feeds today, restarting vancomycin  and sending tracheal aspirate as peaks are 37, plat 24, driving 19. Thick secretions. Fever overnight.  4/2: peaks improved. Ongoing hiccups. Trickle feeding. Neuro exam unchanged.  4/20: trach was changed to cuffless #8 4/28: not safe to swallow. Limited communication and he follows simple commands. On TF 5/1: on TC 28%, with large secretions. Working with speech and he is improving to initiate PMV trials under ST supervision   5/5: on TC 30%, 7 L/min. Trach #6 cuffless, changed 5/1 to facilitate PMV trials  5/12: on TC 21%, 6 L/min. Trach #6 cuffless, unable to produce sounds or speak on PMV. No significant resp secretions. On PEG TF 01/29/2024 tracheostomy was removed due to failure to well provide adequate airway 02/15/2024 pulmonary critical care consult.  Interim History / Subjective:  No acute events today. Per RN, respiratory status has been stable.   Objective   Blood pressure (!) 129/94, pulse (!) 104, temperature 99.1 F (37.3 C), temperature source Axillary, resp. rate 20, height 5' 8 (1.727 m), weight 92 kg, SpO2 96%.        Intake/Output Summary (Last 24 hours) at 02/15/2024 1149 Last data filed at 02/15/2024 1000 Gross per 24 hour  Intake 1671 ml  Output 1250 ml  Net 421 ml  Filed Weights   02/04/24 0500 02/09/24 0422 02/10/24 0500  Weight: 93 kg 88 kg 92 kg   Physical Exam: Chronically ill-appearing male.  Respirations somewhat safaris but clear with deep suctioning\ Tracheal stoma appears to be closed Coarse rhonchi bilaterally Heart sounds are regular Abdomen soft nontender PEG is in place without complication Amber urine      Chest  x-ray with under aeration   Assessment & Plan:  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  Acute hypoxic respiratory failure s/p tracheostomy  He has been without his trach for several weeks now.  He has discussed with secretions in the posterior pharynx.  Yunker suction was inserted copious amount of secretions was aspirated respiratory status improved immediately.  He would probably be better off with a trach but were unable to place a trach.  He needs intermediate deep suctioning with a Yunker suction.  He does not need to be reintubated at this time.  All other issues per primary  Marcey Chin Wachter ACNP Acute Care Nurse Practitioner [facility] Please consult Amion 02/15/2024, 11:50 AM

## 2024-02-15 NOTE — Progress Notes (Signed)
 Patient NTS with very thick tan secretions obtained. Sample sent to lab.

## 2024-02-15 NOTE — Progress Notes (Signed)
 Patient NTS x2. Mod amount of thick tan secretions obtained. Placed on salter. Changed pulse ox. Vitals stable. O2 improved.

## 2024-02-15 NOTE — Progress Notes (Signed)
 Progress Note    Winifred Balogh   FMW:[id number]  DOB: 02-25-1976  DOA: 10/27/2023     111 PCP: Pcp, No  Initial CC: found unresponsive   Hospital Course: 48 year old with no known previous medical history was found down on 10/27/2023, unresponsive with UDS positive for opioids.  He was minimally responsive to Narcan  and he was found to be markedly hypertensive-  blood pressure 262/156.  CT head notable for 4.1 cm intracranial hemorrhage at the pons with intraventricular extension into the fourth ventricle and basal cisterns.  Initially admitted by critical care, and then extubated with tracheostomy, initially placed on 3/20, replaced on 01/05/2024.  PEG tube placed 11/08/2023.   Remains with poor response and poor prognosis at this time Now decannulated due to difficulty with passing suction catheter and being monitored for any decompensation.  PEG in place.    Significant hospital events: 3/8: Intubated in ED, pontine bleed/SAH. CT head Acute large hemorrhage 4.1 cm in pons with intraventricular extension without hydrocephalus. 3/9: CTA No change in IPH in pons, similar biparietal [facility] 3/14: Now awake, appears locked in syndrome.  Can follow commands with eyes. 3/16 Purposeful with left upper extremity  3/22 tracheostomy placed at bedside, bleeding issues overnight from trach site 3/24 PEG 3/24 Sputum culture with MRSA 4/20: trach was changed to cuffless #8 5/1: Mental status appears to be somewhat improving off narcotics, more awake alert following simple commands(able to grimace, move left toes, track vertically with right eye).  Speech following. Trach downsized to [employer] cuffless #6 XLT 5/4: Modafinil  held, low threshold to resume if mental status/ability to interact diminishes 5/10: PEG dislodged, foley bulb placed and IR consulted to replace tube 5/11: IR replaced PEG tube 5/13 - 5/14: + Fever.  Restarted antibiotics.  Urine culture + E. Coli, foley catheter changed. 5/17  Tracheostomy changed.   5/18: Completed antibiotics for UTI 5/23: Stable, no significant change, awaiting disability for placement. 6-11 Tracheostomy de cannulated 01/29/2024 after RT couldn't pass suction catheter.  6/27: placed on 3 L oxygen overnight, tachypnea, increased secretion. Started IV Unasyn . CCM consulted.    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  CT head with acute large hemorrhage 4.1 cm in the pons, ventricular extension. blood pressure 260/1 50s on admission. Was admitted to the ICU and followed by Neuro.  - 2D echo ejection fraction 60%, A1c 4.7, LDL 96. -He was also treated with 3% hypertonic saline.  Follow-up CT head 3/29 with reduction in size of hemorrhage. - PEG dependent; poor prognosis - need repeat GOC discussions given no meaningful improvement now in 3+ months; POOR PROGNOSIS  Acute Respiratory Failure secondary to large pontine stroke brainstem compression Unable to protect airway/possible aspiration pneumonia - ABX completed  -De cannulated 6/11 after RT could pass suction catheter. CCM following, plan to monitor for now, may need tracheostomy in placed in future  per CCM - Per CCM if patient developed respiratory distress, inability to maintain his airway or trouble clearing secretions will need to have low threshold to reintubate. -6/27: patient develop respiratory  distress, increase secretions, placed on 3 L oxygen. Concern for PNA. Started on IV Unasyn . CCM re-consulted.  Chest x ray ordered.   E. coli UTI  Chronic foley Completed antibiotics course 5/18 Foley catheter was changed 5/13. 6/12.  Needs to exchange foley 7/12   Hypertension Continue Norvasc , Losartan , Lopressor  Continue with Hydralazine  BID   Transaminases Improved.  CT abdomen pelvis negative, acute hepatitis panel negative   Moderate to severe protein caloric malnutrition, POA Peg in place, TF per RD.    PEG tube malfunction, resolved PEG dislodged 5/10, replaced on 5/11 by IR   Exposure keratopathy, OS Seen by ophthalmology, Dr. Maree on 4/7.  Treated with maxitrol ointment (neo-poly-dex) TID OS for 1 week. -- Refresh PM TID to QID OS for chronic lubrication   AKI with acute urinary retention Resolved.  Has Foley in placed   Macrocytic anemia thrombocytosis  stable/resolved   Perineal skin injury, POA  Continue Gerhard's Butt cream   Relative B12 and folate deficiency Continue replacement with MVI    Goals of care - Dr Patsy Discussed with neurology on 6/18; given no major improvement since admission, his prognosis is considered poor at this time; he has no meaningful recovery nor QOL. - Family meeting held with Dr Patsy mom and his fiance in person and his dad was on the phone on 02/09/2024; they have at least decided that he is NOT safe for returning home with 24/7 care and they would like to pursue placement once found.  They were not willing to consider de-escalation of care despite discussion of poor prognosis and extremely low chance of any meaningful recovery   Nutrition Problem: Inadequate oral intake Etiology: inability to eat   Interventions: Tube feeding, Prostat, MVI, Juven Nutrition Problem: Inadequate oral intake Etiology: inability to eat Signs/Symptoms: NPO status Interventions: Tube feeding, Prostat, MVI, Juven   Estimated body mass index is 31.17 kg/m as calculated from the following:   Height as of this encounter: 5' 8 (1.727 m).   Weight as  of this encounter: 93 kg.  Interval History:  Alert, not following command. Increase respiratory secretions.  Placed on 3 L oxygen.   DVT prophylaxis:  enoxaparin  (LOVENOX ) injection 40 mg Start: 01/17/24 1000 SCDs Start: 10/27/23 2226   Code Status:   Code Status: Do not attempt resuscitation (DNR) PRE-ARREST INTERVENTIONS DESIRED  Mobility Assessment (Last 72 Hours)     Mobility Assessment     Row Name 02/15/24 0444 02/14/24 2308 02/14/24 1505 02/14/24 0800 02/14/24 0400   Does patient have an order for bedrest or is patient medically unstable Yes- Bedfast (Level 1) - Complete Yes- Bedfast (Level 1) - Complete -- Yes- Bedfast (Level 1) - Complete Yes- Bedfast (Level 1) - Complete   What is the highest level of mobility based on the progressive mobility assessment? Level 1 (Bedfast) - Unable to balance while sitting on edge of bed Level 1 (Bedfast) - Unable to balance while sitting on edge of bed Level 1 (Bedfast) - Unable to balance while sitting on edge of bed Level 1 (Bedfast) - Unable to balance while sitting on edge of bed Level 1 (Bedfast) - Unable to balance while sitting on edge of bed   Is the above level different from baseline mobility prior to current illness? Yes - Recommend PT order Yes - Recommend PT order -- -- Yes - Recommend PT order    Row Name 02/14/24 0000 02/13/24 2120 02/13/24 2000 02/13/24 1600 02/13/24 0800   Does patient have  an order for bedrest or is patient medically unstable Yes- Bedfast (Level 1) - Complete Yes- Bedfast (Level 1) - Complete Yes- Bedfast (Level 1) - Complete -- Yes- Bedfast (Level 1) - Complete   What is the highest level of mobility based on the progressive mobility assessment? Level 1 (Bedfast) - Unable to balance while sitting on edge of bed Level 1 (Bedfast) - Unable to balance while sitting on edge of bed Level 1 (Bedfast) - Unable to balance while sitting on edge of bed Level 1 (Bedfast) - Unable to balance while sitting on edge of bed  Level 1 (Bedfast) - Unable to balance while sitting on edge of bed   Is the above level different from baseline mobility prior to current illness? Yes - Recommend PT order Yes - Recommend PT order Yes - Recommend PT order -- --    Row Name 02/12/24 2045 02/12/24 1336         Does patient have an order for bedrest or is patient medically unstable Yes- Bedfast (Level 1) - Complete --      What is the highest level of mobility based on the progressive mobility assessment? Level 1 (Bedfast) - Unable to balance while sitting on edge of bed Level 1 (Bedfast) - Unable to balance while sitting on edge of bed      Is the above level different from baseline mobility prior to current illness? Yes - Recommend PT order --         Barriers to discharge: Getting insurance Disposition Plan:  SNF once barriers resolved Status is: Inpt  Objective: Blood pressure (!) 129/94, pulse (!) 104, temperature 99.1 F (37.3 C), temperature source Axillary, resp. rate 20, height 5' 8 (1.727 m), weight 92 kg, SpO2 96%.  Examination: Alert General; Mild distress CVS; S 1, S 2 RRR Lungs: Tachypnea, BL ronchus Neuro: Alert, does not follows command  Consultants:  Palliative care  Procedures:    Data Reviewed: Results for orders placed or performed during the hospital encounter of 10/27/23 (from the past 24 hours)  Glucose, capillary     Status: Abnormal   Collection Time: 02/14/24  1:30 PM  Result Value Ref Range   Glucose-Capillary 111 (H) 70 - 99 mg/dL  Glucose, capillary     Status: None   Collection Time: 02/14/24  5:51 PM  Result Value Ref Range   Glucose-Capillary 86 70 - 99 mg/dL  Glucose, capillary     Status: Abnormal   Collection Time: 02/14/24  9:44 PM  Result Value Ref Range   Glucose-Capillary 127 (H) 70 - 99 mg/dL   Comment 1 Notify RN    Comment 2 Document in Chart   Glucose, capillary     Status: Abnormal   Collection Time: 02/15/24  6:27 AM  Result Value Ref Range    Glucose-Capillary 139 (H) 70 - 99 mg/dL   Comment 1 Notify RN    Comment 2 Document in Chart   Glucose, capillary     Status: Abnormal   Collection Time: 02/15/24  9:30 AM  Result Value Ref Range   Glucose-Capillary 148 (H) 70 - 99 mg/dL   Comment 1 Notify RN    Comment 2 Document in Chart   CBC     Status: Abnormal   Collection Time: 02/15/24 11:13 AM  Result Value Ref Range   WBC 15.4 (H) 4.0 - 10.5 K/uL   RBC 4.19 (L) 4.22 - 5.81 MIL/uL   Hemoglobin 11.9 (L) 13.0 - 17.0 g/dL  HCT 39.2 39.0 - 52.0 %   MCV 93.6 80.0 - 100.0 fL   MCH 28.4 26.0 - 34.0 pg   MCHC 30.4 30.0 - 36.0 g/dL   RDW 84.6 88.4 - 84.4 %   Platelets 510 (H) 150 - 400 K/uL   nRBC 0.0 0.0 - 0.2 %        LOS: 111 days   Owen DELENA Lore, MD [facility] 02/15/2024, 12:23 PM

## 2024-02-15 NOTE — TOC Progression Note (Signed)
 Transition of Care [facility]) - Progression Note    Patient Details  Name: Garrett [name] MRN: [id number] Date of Birth: August 24, 1975  [facility]) CM/SW Contact  Almarie CHRISTELLA Goodie, [name] Phone Number: 02/15/2024, 3:25 PM  Clinical Narrative:   CSW received call back from [facility] with DSS, patient's disability remains pending. CSW to follow.    Expected Discharge Plan: Long Term Nursing Home Barriers to Discharge: Continued Medical Work up, SNF Pending Medicaid, SNF Pending bed offer, SNF Pending payor source - LOG, Inadequate or no insurance (New trach)  Expected Discharge Plan and Services In-house Referral: Clinical Social Work, Hospice / Palliative Care   Post Acute Care Choice: Skilled Nursing Facility Living arrangements for the past 2 months: Single Family Home                                       Social Determinants of Health (SDOH) Interventions SDOH Screenings   Food Insecurity: Patient Unable To Answer (10/29/2023)  Social Connections: Unknown (06/23/2023)   Received from [facility]  Tobacco Use: High Risk (10/31/2023)    Readmission Risk Interventions     No data to display

## 2024-02-15 NOTE — Progress Notes (Signed)
 Called to bedside because of desaturations  NT suction patient about 2 times  Saturations improved to 98 to 99%  Sounded clear after suctioning  Will get ABG May be tried on nonrebreather if not able to maintain saturations  Continue periodic suctioning

## 2024-02-15 NOTE — Plan of Care (Signed)
 Reviewing Pt's plan of care. Pt has been stable hemodynamically, afebrile, on room air, no acute distress. We will continue to monitor. Problem: Education: Goal: Knowledge of disease or condition will improve Outcome: Progressing Goal: Knowledge of secondary prevention will improve (MUST DOCUMENT ALL) Outcome: Progressing Goal: Knowledge of patient specific risk factors will improve (DELETE if not current risk factor) Outcome: Progressing   Problem: Coping: Goal: Will verbalize positive feelings about self Outcome: Progressing Goal: Will identify appropriate support needs Outcome: Progressing   Problem: Health Behavior/Discharge Planning: Goal: Ability to manage health-related needs will improve Outcome: Progressing Goal: Goals will be collaboratively established with patient/family Outcome: Progressing   Problem: Nutrition: Goal: Risk of aspiration will decrease Outcome: Progressing Goal: Dietary intake will improve Outcome: Progressing   Problem: Fluid Volume: Goal: Ability to maintain a balanced intake and output will improve Outcome: Progressing   Problem: Skin Integrity: Goal: Risk for impaired skin integrity will decrease Outcome: Progressing   Problem: Clinical Measurements: Goal: Ability to maintain clinical measurements within normal limits will improve Outcome: Progressing Goal: Will remain free from infection Outcome: Progressing Goal: Diagnostic test results will improve Outcome: Progressing Goal: Respiratory complications will improve Outcome: Progressing Goal: Cardiovascular complication will be avoided Outcome: Progressing   Problem: Elimination: Goal: Will not experience complications related to bowel motility Outcome: Progressing Goal: Will not experience complications related to urinary retention Outcome: Progressing   Problem: Safety: Goal: Ability to remain free from injury will improve Outcome: Progressing   Problem: Pain Managment: Goal:  General experience of comfort will improve and/or be controlled Outcome: Progressing   Wendi Dash, RN

## 2024-02-15 NOTE — Progress Notes (Signed)
 NSG 0700-1900: Pt respiratory status declining through shift.  On continuous 02 monitoring.  Lungs rhonchi with upper airway congestion.  Pt unable to cough up secretions.  Suctioning done by RT earlier.    Pt started to desat down to 80-82% again this evening.  RT at the bedside and placed on high flow Bruno.  Pt sounded congested and gurgly, unable to clear secretions.  Dr. Madelyne made away.  This RN spoke with CCMD who evaluated pt at the bedside.  (See note).  02 Sat improved, remains on hi flow.  SO at the bedside and aware of POC, monitoring 02, and resp status.  All questions answered.  Report to be given to night shift RN

## 2024-02-16 DIAGNOSIS — J9601 Acute respiratory failure with hypoxia: Secondary | ICD-10-CM | POA: Diagnosis not present

## 2024-02-16 DIAGNOSIS — I619 Nontraumatic intracerebral hemorrhage, unspecified: Secondary | ICD-10-CM | POA: Diagnosis not present

## 2024-02-16 DIAGNOSIS — I613 Nontraumatic intracerebral hemorrhage in brain stem: Secondary | ICD-10-CM | POA: Diagnosis not present

## 2024-02-16 DIAGNOSIS — Z930 Tracheostomy status: Secondary | ICD-10-CM | POA: Diagnosis not present

## 2024-02-16 LAB — BASIC METABOLIC PANEL WITH GFR
Anion gap: 13 (ref 5–15)
BUN: 29 mg/dL — ABNORMAL HIGH (ref 6–20)
CO2: 26 mmol/L (ref 22–32)
Calcium: 9.5 mg/dL (ref 8.9–10.3)
Chloride: 101 mmol/L (ref 98–111)
Creatinine, Ser: 0.55 mg/dL — ABNORMAL LOW (ref 0.61–1.24)
GFR, Estimated: >60 mL/min (ref >60)
Glucose, Bld: 114 mg/dL — ABNORMAL HIGH (ref 70–99)
Potassium: 4.4 mmol/L (ref 3.5–5.1)
Sodium: 140 mmol/L (ref 135–145)

## 2024-02-16 LAB — CBC
HCT: 35.5 % — ABNORMAL LOW (ref 39.0–52.0)
Hemoglobin: 10.5 g/dL — ABNORMAL LOW (ref 13.0–17.0)
MCH: 28.1 pg (ref 26.0–34.0)
MCHC: 29.6 g/dL — ABNORMAL LOW (ref 30.0–36.0)
MCV: 94.9 fL (ref 80.0–100.0)
Platelets: 468 10*3/uL — ABNORMAL HIGH (ref 150–400)
RBC: 3.74 MIL/uL — ABNORMAL LOW (ref 4.22–5.81)
RDW: 15.5 % (ref 11.5–15.5)
WBC: 22.7 10*3/uL — ABNORMAL HIGH (ref 4.0–10.5)
nRBC: 0.1 % (ref 0.0–0.2)

## 2024-02-16 LAB — GLUCOSE, CAPILLARY
Glucose-Capillary: 129 mg/dL — ABNORMAL HIGH (ref 70–99)
Glucose-Capillary: 143 mg/dL — ABNORMAL HIGH (ref 70–99)
Glucose-Capillary: 178 mg/dL — ABNORMAL HIGH (ref 70–99)
Glucose-Capillary: 127 mg/dL — ABNORMAL HIGH (ref 70–99)
Glucose-Capillary: 130 mg/dL — ABNORMAL HIGH (ref 70–99)

## 2024-02-16 LAB — MRSA NEXT GEN BY PCR, NASAL: MRSA by PCR Next Gen: DETECTED — AB

## 2024-02-16 MED ORDER — SODIUM CHLORIDE 0.9 % IV SOLN
2.0000 g | Freq: Three times a day (TID) | INTRAVENOUS | Status: AC
  Administered 2024-02-16 – 2024-02-27 (×35): 2 g via INTRAVENOUS
  Filled 2024-02-16 (×36): qty 12.5

## 2024-02-16 MED ORDER — VANCOMYCIN HCL 1500 MG/300ML IV SOLN
1500.0000 mg | Freq: Two times a day (BID) | INTRAVENOUS | Status: DC
  Administered 2024-02-16 – 2024-02-20 (×10): 1500 mg via INTRAVENOUS
  Filled 2024-02-16 (×10): qty 300

## 2024-02-16 NOTE — Plan of Care (Signed)
  Problem: Intracerebral Hemorrhage Tissue Perfusion: Goal: Complications of Intracerebral Hemorrhage will be minimized Outcome: Progressing   

## 2024-02-16 NOTE — Progress Notes (Addendum)
 Pharmacy Antibiotic Note  Garrett [name] is a 48 y.o. male with possible pneumonia on Unasyn .  Respiratory status worsening and he is noted with recent MRSA PNA> Pharmacy has been consulted for cefepime  and vancomycin  dosing.  -WBC= 15.4, afebrile, SCr 0.55  Plan: -Cefepime  2gm IV q8h -Vancomycin  1500mg  IV q12h; estimated AUC 470  -Will follow renal function, cultures and clinical progress   Height: 5' 8 (172.7 cm) Weight: 92 kg (202 lb 13.2 oz) IBW/kg (Calculated) : 68.4  Temp (24hrs), Avg:99 F (37.2 C), Min:98.8 F (37.1 C), Max:99.2 F (37.3 C)  Recent Labs  Lab 02/13/24 0801 02/15/24 1113 02/16/24 0537  WBC 9.4 15.4*  --   CREATININE 0.45* 0.53* 0.55*    Estimated Creatinine Clearance: 125.6 mL/min (A) (by C-G formula based on SCr of 0.55 mg/dL (L)).    Allergies  Allergen Reactions   Tomato Hives, Itching and Rash    Antimicrobials this admission: Cefepime  6/38 Vanc 6/28>> Unasyn  6/27>> 6/28   Vanc 6/1 >> 6/8 - 5/14 TA - MRSA (colonization) - 6/2 TA- GNRs, GPCs, GPRs>>MRSA (micro said they did not see the GNR or GPR on cx  Dose adjustments this admission:   Microbiology results: 5/14 TA - MRSA (colonization) 6/2 TA- GNRs, GPCs, GPRs>>MRSA (micro said they did not see the GNR or GPR on cx  6/27 trach, GNR, GPR, GPR 6/28 MRSA PCR  Thank you for allowing pharmacy to be a part of this patient's care.  Prentice Poisson, PharmD Clinical Pharmacist **Pharmacist phone directory can now be found on [email or web address] ([email or web address]).  Listed under [facility].

## 2024-02-16 NOTE — Progress Notes (Signed)
 Progress Note    Garrett Schue   FMW:[id number]  DOB: 1976/01/12  DOA: 10/27/2023     112 PCP: Pcp, No  Initial CC: found unresponsive   Hospital Course: 48 year old with no known previous medical history was found down on 10/27/2023, unresponsive with UDS positive for opioids.  He was minimally responsive to Narcan  and he was found to be markedly hypertensive-  blood pressure 262/156.  CT head notable for 4.1 cm intracranial hemorrhage at the pons with intraventricular extension into the fourth ventricle and basal cisterns.  Initially admitted by critical care, and then extubated with tracheostomy, initially placed on 3/20, replaced on 01/05/2024.  PEG tube placed 11/08/2023.   Remains with poor response and poor prognosis at this time Now decannulated due to difficulty with passing suction catheter and being monitored for any decompensation.  PEG in place.    Significant hospital events: 3/8: Intubated in ED, pontine bleed/SAH. CT head Acute large hemorrhage 4.1 cm in pons with intraventricular extension without hydrocephalus. 3/9: CTA No change in IPH in pons, similar biparietal [facility] 3/14: Now awake, appears locked in syndrome.  Can follow commands with eyes. 3/16 Purposeful with left upper extremity  3/22 tracheostomy placed at bedside, bleeding issues overnight from trach site 3/24 PEG 3/24 Sputum culture with MRSA 4/20: trach was changed to cuffless #8 5/1: Mental status appears to be somewhat improving off narcotics, more awake alert following simple commands(able to grimace, move left toes, track vertically with right eye).  Speech following. Trach downsized to [employer] cuffless #6 XLT 5/4: Modafinil  held, low threshold to resume if mental status/ability to interact diminishes 5/10: PEG dislodged, foley bulb placed and IR consulted to replace tube 5/11: IR replaced PEG tube 5/13 - 5/14: + Fever.  Restarted antibiotics.  Urine culture + E. Coli, foley catheter changed. 5/17  Tracheostomy changed.   5/18: Completed antibiotics for UTI 5/23: Stable, no significant change, awaiting disability for placement. 6-11 Tracheostomy de cannulated 01/29/2024 after RT couldn't pass suction catheter.  6/27: placed on 3 L oxygen overnight, tachypnea, increased secretion. Started IV Unasyn . CCM consulted.  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  CT head with acute large hemorrhage 4.1 cm in the pons, ventricular extension. blood pressure 260/1 50s on admission. Was admitted to the ICU and followed by Neuro.  - 2D echo ejection fraction 60%, A1c 4.7, LDL 96. -He was also treated with 3% hypertonic saline.  Follow-up CT head 3/29 with reduction in size of hemorrhage. - PEG dependent; poor prognosis - need repeat GOC discussions given no meaningful improvement now in 3+ months; POOR PROGNOSIS  Acute Respiratory Failure secondary to large pontine stroke brainstem compression Unable to protect airway/possible aspiration pneumonia - ABX completed  -De cannulated 6/11 after RT could pass suction catheter. CCM following, plan to monitor for now, may need tracheostomy in placed in future  per CCM - Per CCM if patient developed respiratory distress, inability to maintain his airway or trouble clearing secretions will need to have low  threshold to reintubate. -6/27: Patient develop respiratory distress, increase secretions, placed on 3 L oxygen. Concern  for PNA. Started on IV Unasyn . CCM re-consulted. Chest x ray: under inflation, enlarge cardiac  silhouette.  6/28-- requiring HF oxygen 15 L. Culture growing gram negative rids, gram positive cocci. Broad antibiotic to Vancomycin  and Cefepime .  Continue suctioning. Appreciate CCM assistance.   E. coli UTI  Chronic foley Completed antibiotics course 5/18 Foley catheter was changed 5/13. 6/12.  Needs to exchange foley 7/12   Hypertension Continue Norvasc , Losartan , Lopressor  Continue with Hydralazine  BID   Transaminases Improved.  CT abdomen pelvis negative, acute hepatitis panel negative   Moderate to severe protein caloric malnutrition, POA Peg in place, TF per RD.    PEG tube malfunction, resolved PEG dislodged 5/10, replaced on 5/11 by IR   Exposure keratopathy, OS Seen by ophthalmology, Dr. Maree on 4/7.  Treated with maxitrol ointment (neo-poly-dex) TID OS for 1 week. -- Refresh PM TID to QID OS for chronic lubrication   AKI with acute urinary retention Resolved.  Has Foley in placed   Macrocytic anemia thrombocytosis  stable/resolved   Perineal skin injury, POA  Continue Gerhard's Butt cream   Relative B12 and folate deficiency Continue replacement with MVI    Goals of care - Dr Patsy Discussed with neurology on 6/18; given no major improvement since admission, his prognosis is considered poor at this time; he has no meaningful recovery nor QOL. - Family meeting held with Dr Patsy mom and his fiance in person and his dad was on the phone on 02/09/2024; they have at least decided that he is NOT safe for returning home with 24/7 care and they would like to pursue placement once found.  They were not willing to consider de-escalation of care despite discussion of poor prognosis and extremely low chance of any meaningful recovery   Nutrition Problem: Inadequate oral intake Etiology: inability to eat   Interventions: Tube feeding, Prostat, MVI, Juven Nutrition  Problem: Inadequate oral intake Etiology: inability to eat Signs/Symptoms: NPO status Interventions: Tube feeding, Prostat, MVI, Juven   Estimated body mass index is 31.17 kg/m as calculated from the following:   Height as of this encounter: 5' 8 (1.727 m).   Weight as of this encounter: 93 kg.  Interval History:  Alert, not following command. Increase respiratory secretions.  Placed on 3 L oxygen.   DVT prophylaxis:  enoxaparin  (LOVENOX ) injection 40 mg Start: 01/17/24 1000 SCDs Start: 10/27/23 2226   Code Status:   Code Status: Do not attempt resuscitation (DNR) PRE-ARREST INTERVENTIONS DESIRED  Mobility Assessment (Last 72 Hours)     Mobility Assessment     Row Name 02/16/24 0814 02/15/24 2000 02/15/24 0800 02/15/24 0444 02/14/24 2308   Does patient have an order for bedrest or is patient medically unstable Yes- Bedfast (Level 1) - Complete Yes- Bedfast (Level 1) - Complete Yes- Bedfast (Level 1) - Complete Yes- Bedfast (Level 1) - Complete Yes- Bedfast (Level 1) - Complete   What is the highest level of mobility based on the progressive mobility assessment? Level 1 (Bedfast) - Unable to balance while sitting on edge of bed Level 1 (Bedfast) - Unable to balance while sitting on edge of bed Level 1 (Bedfast) - Unable to balance while sitting on edge of bed Level 1 (Bedfast) - Unable to balance while sitting on edge of bed Level 1 (Bedfast) - Unable to balance while sitting on edge of bed   Is the above level different  from baseline mobility prior to current illness? Yes - Recommend PT order -- Yes - Recommend PT order Yes - Recommend PT order Yes - Recommend PT order    Row Name 02/14/24 1505 02/14/24 0800 02/14/24 0400 02/14/24 0000 02/13/24 2120   Does patient have an order for bedrest or is patient medically unstable -- Yes- Bedfast (Level 1) - Complete Yes- Bedfast (Level 1) - Complete Yes- Bedfast (Level 1) - Complete Yes- Bedfast (Level 1) - Complete   What is the highest  level of mobility based on the progressive mobility assessment? Level 1 (Bedfast) - Unable to balance while sitting on edge of bed Level 1 (Bedfast) - Unable to balance while sitting on edge of bed Level 1 (Bedfast) - Unable to balance while sitting on edge of bed Level 1 (Bedfast) - Unable to balance while sitting on edge of bed Level 1 (Bedfast) - Unable to balance while sitting on edge of bed   Is the above level different from baseline mobility prior to current illness? -- -- Yes - Recommend PT order Yes - Recommend PT order Yes - Recommend PT order    Row Name 02/13/24 2000 02/13/24 1600         Does patient have an order for bedrest or is patient medically unstable Yes- Bedfast (Level 1) - Complete --      What is the highest level of mobility based on the progressive mobility assessment? Level 1 (Bedfast) - Unable to balance while sitting on edge of bed Level 1 (Bedfast) - Unable to balance while sitting on edge of bed      Is the above level different from baseline mobility prior to current illness? Yes - Recommend PT order --         Barriers to discharge: Getting insurance Disposition Plan:  SNF once barriers resolved Status is: Inpt  Objective: Blood pressure 111/79, pulse (!) 106, temperature 99 F (37.2 C), temperature source Axillary, resp. rate 20, height 5' 8 (1.727 m), weight 92 kg, SpO2 100%.  Examination: Alert, not following command General; No acute distress CVS; S 1, S 2 RRR Lungs: Tachypnea, BL ronchus Neuro:  eyes open, does not follows command  Consultants:  Palliative care  Procedures:    Data Reviewed: Results for orders placed or performed during the hospital encounter of 10/27/23 (from the past 24 hours)  Glucose, capillary     Status: Abnormal   Collection Time: 02/15/24  1:16 PM  Result Value Ref Range   Glucose-Capillary 158 (H) 70 - 99 mg/dL   Comment 1 Notify RN    Comment 2 Document in Chart   Glucose, capillary     Status: Abnormal    Collection Time: 02/15/24  5:55 PM  Result Value Ref Range   Glucose-Capillary 103 (H) 70 - 99 mg/dL   Comment 1 Notify RN    Comment 2 Document in Chart   Blood gas, arterial     Status: Abnormal   Collection Time: 02/15/24  7:33 PM  Result Value Ref Range   pH, Arterial 7.4 7.35 - 7.45   pCO2 arterial 61 (H) 32 - 48 mmHg   pO2, Arterial 93 83 - 108 mmHg   Bicarbonate 37.8 (H) 20.0 - 28.0 mmol/L   Acid-Base Excess 10.5 (H) 0.0 - 2.0 mmol/L   O2 Saturation 99.1 %   Patient temperature 37.0    Collection site LEFT RADIAL    Drawn by [id number]    Allens test (pass/fail) PASS PASS  Glucose, capillary     Status: Abnormal   Collection Time: 02/15/24  9:52 PM  Result Value Ref Range   Glucose-Capillary 162 (H) 70 - 99 mg/dL   Comment 1 Notify RN   Basic metabolic panel with GFR     Status: Abnormal   Collection Time: 02/16/24  5:37 AM  Result Value Ref Range   Sodium 140 135 - 145 mmol/L   Potassium 4.4 3.5 - 5.1 mmol/L   Chloride 101 98 - 111 mmol/L   CO2 26 22 - 32 mmol/L   Glucose, Bld 114 (H) 70 - 99 mg/dL   BUN 29 (H) 6 - 20 mg/dL   Creatinine, Ser 9.44 (L) 0.61 - 1.24 mg/dL   Calcium 9.5 8.9 - 89.6 mg/dL   GFR, Estimated >39 >39 mL/min   Anion gap 13 5 - 15  Glucose, capillary     Status: Abnormal   Collection Time: 02/16/24  6:08 AM  Result Value Ref Range   Glucose-Capillary 129 (H) 70 - 99 mg/dL   Comment 1 Notify RN   Glucose, capillary     Status: Abnormal   Collection Time: 02/16/24 10:05 AM  Result Value Ref Range   Glucose-Capillary 143 (H) 70 - 99 mg/dL   Comment 1 Notify RN    Comment 2 Document in Chart   CBC     Status: Abnormal   Collection Time: 02/16/24 11:56 AM  Result Value Ref Range   WBC 22.7 (H) 4.0 - 10.5 K/uL   RBC 3.74 (L) 4.22 - 5.81 MIL/uL   Hemoglobin 10.5 (L) 13.0 - 17.0 g/dL   HCT 64.4 (L) 60.9 - 47.9 %   MCV 94.9 80.0 - 100.0 fL   MCH 28.1 26.0 - 34.0 pg   MCHC 29.6 (L) 30.0 - 36.0 g/dL   RDW 84.4 88.4 - 84.4 %   Platelets 468 (H)  150 - 400 K/uL   nRBC 0.1 0.0 - 0.2 %        LOS: 112 days   Owen DELENA Lore, MD [facility] 02/16/2024, 12:37 PM

## 2024-02-16 NOTE — Progress Notes (Addendum)
 NAME:  Garrett [name], MRN:  [id number], DOB:  December 07, 1975, LOS: 112 ADMISSION DATE:  10/27/2023, CONSULTATION DATE:  10/27/23 REFERRING MD:  Dr Madelyne, CHIEF COMPLAINT:  Found down   History of Present Illness:  48 y/o male with past medical history of hypertension who was found down for unknown downtime by his fiance when she came home from work. Reportedly, having agonal breathing.  He apparently had driven her to work this morning.  He has HTN but never checks his BP and does not follow with MD.  She says you can tell when his BP is really high; his eyes get blood shot and he is sweating.  No h/o seizures.  Besides almost daily [name] and cigarette smoking, she is not aware of any other drugs. Drug screen positive for opioids and he was given several doses of Narcan . Patient found to have:  1. 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 Basal cisterns and fourth ventricle are effaced without hydrocephalus at this time. 2. Trace additional subarachnoid hemorrhage along the left parietal convexity. BP in ED 262/156. His CXR revealed a RUL and perihilar infiltrates suggesting aspiration pneumonia. ED labs also revealed PH 7.169, LA 4.4, CPK 985. Patient was intubated in ED.  Pertinent  Medical History  Hypertension  Significant Hospital Events: Including procedures, antibiotic start and stop dates in addition to other pertinent events   3/8: Intubated in ED, pontine bleed/SAH. CT head 17:09 Acute large hemorrhage 4.1 cm in pons with intraventricular extension without hydrocephalus 3/9: CTA 03:14 AM No change in IPH in pons, similar biparietal [facility] 3/14: Now awake, appears locked in can follow commands with eyes. 3/16 Purposeful with left upper extremity  3/22 tracheostomy placed at bedside, bleeding issues overnight from trach site 3/24 PEG (Dr. Sebastian) 3/24 sputum  culture with MRSA 3/27 vomited. TF on hold. Abd film c/w ileus. Added reglan , got SSE. Did tolerate PSV all day  3/28 BMs x2 after SSE day prior. Added back TFs at 1/2 rated. Tolerated PS 3/29 Tolerated PS  3/31 Tolerating CPAP PS 15/5, TF on hold due to ileus. 4/1: con't to hold tube feeds today, restarting vancomycin  and sending tracheal aspirate as peaks are 37, plat 24, driving 19. Thick secretions. Fever overnight.  4/2: peaks improved. Ongoing hiccups. Trickle feeding. Neuro exam unchanged.  4/20: trach was changed to cuffless #8 4/28: not safe to swallow. Limited communication and he follows simple commands. On TF 5/1: on TC 28%, with large secretions. Working with speech and he is improving to initiate PMV trials under ST supervision   5/5: on TC 30%, 7 L/min. Trach #6 cuffless, changed 5/1 to facilitate PMV trials  5/12: on TC 21%, 6 L/min. Trach #6 cuffless, unable to produce sounds or speak on PMV. No significant resp secretions. On PEG TF 01/29/2024 tracheostomy was removed due to failure to well provide adequate airway 02/15/2024 pulmonary critical care consult.  Interim History / Subjective:  Tachypnea Increased secretions  Objective    Blood pressure 134/88, pulse (!) 106, temperature 99.4 F (37.4 C), temperature source Axillary, resp. rate 20, height 5' 8 (1.727 m), weight 92 kg, SpO2 100%.        Intake/Output Summary (Last 24 hours) at 02/16/2024 1308 Last data filed at 02/16/2024 1100 Gross per 24 hour  Intake 804 ml  Output 1350 ml  Net -546 ml   Filed Weights   02/04/24 0500 02/09/24  0422 02/10/24 0500  Weight: 93 kg 88 kg 92 kg    Examination: General: Chronically ill-appearing HENT: Middle-aged Lungs: Rhonchi Cardiovascular: S1-S2 appreciated Abdomen: Soft, bowel sounds appreciated Extremities: No clubbing, no edema Neuro: No meaningful interaction, eyes remain open, does not interact or follow commands GU:   Resolved problem list   Assessment  and Plan   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 Acute hypoxic respiratory failure s/p tracheostomy - Decannulated 6/11  Increased secretions and congestion - Requires frequent suctioning  Continue bronchodilators Continue hypertonic saline  Hypoxemic respiratory failure - Continue oxygen supplementation  Was empirically started on antibiotics secondary to worsening secretions and increased oxygen requirement  Pulmonary will continue to follow  I do not believe he needs to be escalated to ICU status at present but yes does need monitoring, currently on progressive floor If not able to stabilize, may need to be reintubated and revision of tracheostomy

## 2024-02-17 DIAGNOSIS — I619 Nontraumatic intracerebral hemorrhage, unspecified: Secondary | ICD-10-CM | POA: Diagnosis not present

## 2024-02-17 LAB — BASIC METABOLIC PANEL WITH GFR
Anion gap: 8 (ref 5–15)
BUN: 25 mg/dL — ABNORMAL HIGH (ref 6–20)
CO2: 32 mmol/L (ref 22–32)
Calcium: 9 mg/dL (ref 8.9–10.3)
Chloride: 100 mmol/L (ref 98–111)
Creatinine, Ser: 0.59 mg/dL — ABNORMAL LOW (ref 0.61–1.24)
GFR, Estimated: >60 mL/min (ref >60)
Glucose, Bld: 166 mg/dL — ABNORMAL HIGH (ref 70–99)
Potassium: 4.3 mmol/L (ref 3.5–5.1)
Sodium: 140 mmol/L (ref 135–145)

## 2024-02-17 LAB — CBC
HCT: 33.8 % — ABNORMAL LOW (ref 39.0–52.0)
Hemoglobin: 9.8 g/dL — ABNORMAL LOW (ref 13.0–17.0)
MCH: 28.1 pg (ref 26.0–34.0)
MCHC: 29 g/dL — ABNORMAL LOW (ref 30.0–36.0)
MCV: 96.8 fL (ref 80.0–100.0)
Platelets: 448 10*3/uL — ABNORMAL HIGH (ref 150–400)
RBC: 3.49 MIL/uL — ABNORMAL LOW (ref 4.22–5.81)
RDW: 15.4 % (ref 11.5–15.5)
WBC: 18 10*3/uL — ABNORMAL HIGH (ref 4.0–10.5)
nRBC: 0 % (ref 0.0–0.2)

## 2024-02-17 LAB — GLUCOSE, CAPILLARY
Glucose-Capillary: 109 mg/dL — ABNORMAL HIGH (ref 70–99)
Glucose-Capillary: 123 mg/dL — ABNORMAL HIGH (ref 70–99)
Glucose-Capillary: 85 mg/dL (ref 70–99)
Glucose-Capillary: 122 mg/dL — ABNORMAL HIGH (ref 70–99)
Glucose-Capillary: 117 mg/dL — ABNORMAL HIGH (ref 70–99)

## 2024-02-17 NOTE — Progress Notes (Signed)
 Progress Note    Garrett [name]   FMW:[id number]  DOB: 1975/08/30  DOA: 10/27/2023     113 PCP: Pcp, No  Initial CC: found unresponsive   Hospital Course: 48 year old with no known previous medical history was found down on 10/27/2023, unresponsive with UDS positive for opioids.  He was minimally responsive to Narcan  and he was found to be markedly hypertensive-  blood pressure 262/156.  CT head notable for 4.1 cm intracranial hemorrhage at the pons with intraventricular extension into the fourth ventricle and basal cisterns.  Initially admitted by critical care, and then extubated with tracheostomy, initially placed on 3/20, replaced on 01/05/2024.  PEG tube placed 11/08/2023.   Remains with poor response and poor prognosis at this time Now decannulated due to difficulty with passing suction catheter and being monitored for any decompensation.  PEG in place.    Significant hospital events: 3/8: Intubated in ED, pontine bleed/SAH. CT head Acute large hemorrhage 4.1 cm in pons with intraventricular extension without hydrocephalus. 3/9: CTA No change in IPH in pons, similar biparietal [facility] 3/14: Now awake, appears locked in syndrome.  Can follow commands with eyes. 3/16 Purposeful with left upper extremity  3/22 tracheostomy placed at bedside, bleeding issues overnight from trach site 3/24 PEG 3/24 Sputum culture with MRSA 4/20: trach was changed to cuffless #8 5/1: Mental status appears to be somewhat improving off narcotics, more awake alert following simple commands(able to grimace, move left toes, track vertically with right eye).  Speech following. Trach downsized to [employer] cuffless #6 XLT 5/4: Modafinil  held, low threshold to resume if mental status/ability to interact diminishes 5/10: PEG dislodged, foley bulb placed and IR consulted to replace tube 5/11: IR replaced PEG tube 5/13 - 5/14: + Fever.  Restarted antibiotics.  Urine culture + E. Coli, foley catheter changed. 5/17  Tracheostomy changed.   5/18: Completed antibiotics for UTI 5/23: Stable, no significant change, awaiting disability for placement. 6-11 Tracheostomy de cannulated 01/29/2024 after RT couldn't pass suction catheter.  6/27: placed on 3 L oxygen overnight, tachypnea, increased secretion. Started IV Unasyn . CCM consulted.  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  CT head with acute large hemorrhage 4.1 cm in the pons, ventricular extension. blood pressure 260/1 50s on admission. Was admitted to the ICU and followed by Neuro.  - 2D echo ejection fraction 60%, A1c 4.7, LDL 96. -He was also treated with 3% hypertonic saline.  Follow-up CT head 3/29 with reduction in size of hemorrhage. - PEG dependent; poor prognosis - need repeat GOC discussions given no meaningful improvement now in 3+ months; POOR PROGNOSIS  Acute Respiratory Failure secondary to large pontine stroke brainstem compression Unable to protect airway/possible aspiration pneumonia - ABX completed  -De cannulated 6/11 after RT could pass suction catheter. CCM following, plan to monitor for now, may need tracheostomy in placed in future  per CCM - Per CCM if patient developed respiratory distress, inability to maintain his airway or trouble clearing secretions will need to have low  threshold to reintubate. -6/27: Patient develop respiratory distress, increase secretions, placed on 3 L oxygen. Concern  for PNA. Started on IV Unasyn . CCM re-consulted. Chest x ray: under inflation, enlarge cardiac  silhouette.  6/28-- requiring HF oxygen 15 L. Culture growing Staph and Klebsiella. Continue with  Vancomycin  and Cefepime .  Continue suctioning. Appreciate CCM assistance.   Staph, klebsiella  Pneumonia:  Develops worsening secretions and Increase oxygen requirement.  Continue with vancomycin  and cefepime .    E. coli UTI  Chronic foley Completed antibiotics course 5/18 Foley catheter was changed 5/13. 6/12.  Needs to exchange foley 7/12   Hypertension Continue Norvasc , Losartan , Lopressor  Continue with Hydralazine  BID Discussed with Nurse, might have to hold some BP meds depending on BP reading.   Transaminases Improved.  CT abdomen pelvis negative, acute hepatitis panel negative   Moderate to severe protein caloric malnutrition, POA Peg in place, TF per RD.    PEG tube malfunction, resolved PEG dislodged 5/10, replaced on 5/11 by IR   Exposure keratopathy, OS Seen by ophthalmology, Dr. Maree on 4/7.  Treated with maxitrol ointment (neo-poly-dex) TID OS for 1 week. -- Refresh PM TID to QID OS for chronic lubrication   AKI with acute urinary retention Resolved.  Has Foley in placed   Macrocytic anemia thrombocytosis  stable/resolved   Perineal skin injury, POA  Continue Gerhard's Butt cream   Relative B12 and folate deficiency Continue replacement with MVI    Goals of care - Dr Patsy Discussed with neurology on 6/18; given no major improvement since admission, his prognosis is considered poor at this time; he has no meaningful recovery nor QOL. - Family meeting held with Dr Patsy mom and his fiance in person and his dad was on the phone on 02/09/2024; they have at least decided that he is NOT safe for returning home with 24/7 care and they would like to pursue placement once found.  They were not willing to consider de-escalation of care despite discussion of poor  prognosis and extremely low chance of any meaningful recovery   Nutrition Problem: Inadequate oral intake Etiology: inability to eat   Interventions: Tube feeding, Prostat, MVI, Juven Nutrition Problem: Inadequate oral intake Etiology: inability to eat Signs/Symptoms: NPO status Interventions: Tube feeding, Prostat, MVI, Juven   Estimated body mass index is 31.17 kg/m as calculated from the following:   Height as of this encounter: 5' 8 (1.727 m).   Weight as of this encounter: 93 kg.  Interval History:  Alert, not following command. Increase respiratory secretions.  Placed on 3 L oxygen.   DVT prophylaxis:  enoxaparin  (LOVENOX ) injection 40 mg Start: 01/17/24 1000 SCDs Start: 10/27/23 2226   Code Status:   Code Status: Do not attempt resuscitation (DNR) PRE-ARREST INTERVENTIONS DESIRED  Mobility Assessment (Last 72 Hours)     Mobility Assessment     Row Name 02/16/24 2000 02/16/24 1600 02/16/24 1200 02/16/24 0814 02/15/24 2000   Does patient have an order for bedrest or is patient medically unstable Yes- Bedfast (Level 1) - Complete Yes- Bedfast (Level 1) - Complete Yes- Bedfast (Level 1) - Complete Yes- Bedfast (Level 1) - Complete Yes- Bedfast (Level 1) - Complete   What is the highest level of mobility based on the progressive mobility assessment? Level 1 (Bedfast) - Unable to balance while sitting on edge of bed Level 1 (Bedfast) - Unable to balance while sitting on edge of bed Level 1 (Bedfast) - Unable to balance while sitting on edge of bed Level  1 (Bedfast) - Unable to balance while sitting on edge of bed Level 1 (Bedfast) - Unable to balance while sitting on edge of bed   Is the above level different from baseline mobility prior to current illness? -- -- -- Yes - Recommend PT order --    Row Name 02/15/24 0800 02/15/24 0444 02/14/24 2308 02/14/24 1505     Does patient have an order for bedrest or is patient medically unstable Yes- Bedfast (Level 1) - Complete Yes-  Bedfast (Level 1) - Complete Yes- Bedfast (Level 1) - Complete --    What is the highest level of mobility based on the progressive mobility assessment? Level 1 (Bedfast) - Unable to balance while sitting on edge of bed Level 1 (Bedfast) - Unable to balance while sitting on edge of bed Level 1 (Bedfast) - Unable to balance while sitting on edge of bed Level 1 (Bedfast) - Unable to balance while sitting on edge of bed    Is the above level different from baseline mobility prior to current illness? Yes - Recommend PT order Yes - Recommend PT order Yes - Recommend PT order --       Barriers to discharge: Getting insurance Disposition Plan:  SNF once barriers resolved Status is: Inpt  Objective: Blood pressure 119/78, pulse 87, temperature 98.8 F (37.1 C), temperature source Oral, resp. rate (!) 23, height 5' 8 (1.727 m), weight 92 kg, SpO2 100%.  Examination: eyes open, dose not follows command General; No acute distress CVS; S 1, S 2 RRR Lungs: Tachypnea, BL ronchu Neuro:  eyes open, does not follows command  Consultants:  Palliative care  Procedures:    Data Reviewed: Results for orders placed or performed during the hospital encounter of 10/27/23 (from the past 24 hours)  MRSA Next Gen by PCR, Nasal     Status: Abnormal   Collection Time: 02/16/24  1:49 PM   Specimen: Nasal Mucosa; Nasal Swab  Result Value Ref Range   MRSA by PCR Next Gen DETECTED (A) NOT DETECTED  Glucose, capillary     Status: Abnormal   Collection Time: 02/16/24  1:50 PM  Result Value Ref Range   Glucose-Capillary 178 (H) 70 - 99 mg/dL  Glucose, capillary     Status: Abnormal   Collection Time: 02/16/24  5:12 PM  Result Value Ref Range   Glucose-Capillary 127 (H) 70 - 99 mg/dL   Comment 1 Notify RN    Comment 2 Document in Chart   Glucose, capillary     Status: Abnormal   Collection Time: 02/16/24  9:12 PM  Result Value Ref Range   Glucose-Capillary 130 (H) 70 - 99 mg/dL   Comment 1 Notify RN     Comment 2 Document in Chart   Glucose, capillary     Status: Abnormal   Collection Time: 02/17/24  5:56 AM  Result Value Ref Range   Glucose-Capillary 109 (H) 70 - 99 mg/dL   Comment 1 Notify RN    Comment 2 Document in Chart   CBC     Status: Abnormal   Collection Time: 02/17/24  7:24 AM  Result Value Ref Range   WBC 18.0 (H) 4.0 - 10.5 K/uL   RBC 3.49 (L) 4.22 - 5.81 MIL/uL   Hemoglobin 9.8 (L) 13.0 - 17.0 g/dL   HCT 66.1 (L) 60.9 - 47.9 %   MCV 96.8 80.0 - 100.0 fL   MCH 28.1 26.0 - 34.0 pg   MCHC 29.0 (L) 30.0 - 36.0  g/dL   RDW 84.5 88.4 - 84.4 %   Platelets 448 (H) 150 - 400 K/uL   nRBC 0.0 0.0 - 0.2 %  Basic metabolic panel     Status: Abnormal   Collection Time: 02/17/24  7:24 AM  Result Value Ref Range   Sodium 140 135 - 145 mmol/L   Potassium 4.3 3.5 - 5.1 mmol/L   Chloride 100 98 - 111 mmol/L   CO2 32 22 - 32 mmol/L   Glucose, Bld 166 (H) 70 - 99 mg/dL   BUN 25 (H) 6 - 20 mg/dL   Creatinine, Ser 9.40 (L) 0.61 - 1.24 mg/dL   Calcium 9.0 8.9 - 89.6 mg/dL   GFR, Estimated >39 >39 mL/min   Anion gap 8 5 - 15  Glucose, capillary     Status: Abnormal   Collection Time: 02/17/24  9:54 AM  Result Value Ref Range   Glucose-Capillary 123 (H) 70 - 99 mg/dL   Comment 1 Notify RN         LOS: 113 days   Owen DELENA Lore, MD [facility] 02/17/2024, 1:29 PM

## 2024-02-17 NOTE — Plan of Care (Signed)
 Able to wean O2 to 8L,  improving from yesterday with high RR and higher O2 demands.

## 2024-02-17 NOTE — Plan of Care (Signed)
  Problem: Education: Goal: Knowledge of disease or condition will improve Outcome: Progressing   Problem: Nutrition: Goal: Risk of aspiration will decrease Outcome: Progressing Goal: Dietary intake will improve Outcome: Progressing   Problem: Education: Goal: Knowledge of disease or condition will improve Outcome: Progressing   Problem: Nutrition: Goal: Risk of aspiration will decrease Outcome: Progressing Goal: Dietary intake will improve Outcome: Progressing

## 2024-02-18 DIAGNOSIS — I619 Nontraumatic intracerebral hemorrhage, unspecified: Secondary | ICD-10-CM | POA: Diagnosis not present

## 2024-02-18 DIAGNOSIS — J9601 Acute respiratory failure with hypoxia: Secondary | ICD-10-CM | POA: Diagnosis not present

## 2024-02-18 DIAGNOSIS — I613 Nontraumatic intracerebral hemorrhage in brain stem: Secondary | ICD-10-CM | POA: Diagnosis not present

## 2024-02-18 LAB — GLUCOSE, CAPILLARY
Glucose-Capillary: 113 mg/dL — ABNORMAL HIGH (ref 70–99)
Glucose-Capillary: 149 mg/dL — ABNORMAL HIGH (ref 70–99)
Glucose-Capillary: 152 mg/dL — ABNORMAL HIGH (ref 70–99)
Glucose-Capillary: 159 mg/dL — ABNORMAL HIGH (ref 70–99)
Glucose-Capillary: 161 mg/dL — ABNORMAL HIGH (ref 70–99)

## 2024-02-18 LAB — CBC WITH DIFFERENTIAL/PLATELET
Abs Immature Granulocytes: 0.05 K/uL (ref 0.00–0.07)
Basophils Absolute: 0 K/uL (ref 0.0–0.1)
Basophils Relative: 0 %
Eosinophils Absolute: 0.1 K/uL (ref 0.0–0.5)
Eosinophils Relative: 1 %
HCT: 35.2 % — ABNORMAL LOW (ref 39.0–52.0)
Hemoglobin: 10.1 g/dL — ABNORMAL LOW (ref 13.0–17.0)
Immature Granulocytes: 0 %
Lymphocytes Relative: 14 %
Lymphs Abs: 1.8 K/uL (ref 0.7–4.0)
MCH: 28 pg (ref 26.0–34.0)
MCHC: 28.7 g/dL — ABNORMAL LOW (ref 30.0–36.0)
MCV: 97.5 fL (ref 80.0–100.0)
Monocytes Absolute: 1.2 K/uL — ABNORMAL HIGH (ref 0.1–1.0)
Monocytes Relative: 9 %
Neutro Abs: 10.1 K/uL — ABNORMAL HIGH (ref 1.7–7.7)
Neutrophils Relative %: 76 %
Platelets: 453 K/uL — ABNORMAL HIGH (ref 150–400)
RBC: 3.61 MIL/uL — ABNORMAL LOW (ref 4.22–5.81)
RDW: 14.9 % (ref 11.5–15.5)
WBC: 13.3 K/uL — ABNORMAL HIGH (ref 4.0–10.5)
nRBC: 0 % (ref 0.0–0.2)

## 2024-02-18 LAB — COMPREHENSIVE METABOLIC PANEL WITH GFR
ALT: 154 U/L — ABNORMAL HIGH (ref 0–44)
AST: 57 U/L — ABNORMAL HIGH (ref 15–41)
Albumin: 2.6 g/dL — ABNORMAL LOW (ref 3.5–5.0)
Alkaline Phosphatase: 73 U/L (ref 38–126)
Anion gap: 11 (ref 5–15)
BUN: 27 mg/dL — ABNORMAL HIGH (ref 6–20)
CO2: 31 mmol/L (ref 22–32)
Calcium: 9 mg/dL (ref 8.9–10.3)
Chloride: 101 mmol/L (ref 98–111)
Creatinine, Ser: 0.56 mg/dL — ABNORMAL LOW (ref 0.61–1.24)
GFR, Estimated: >60 mL/min (ref >60)
Glucose, Bld: 181 mg/dL — ABNORMAL HIGH (ref 70–99)
Potassium: 4.3 mmol/L (ref 3.5–5.1)
Sodium: 143 mmol/L (ref 135–145)
Total Bilirubin: 0.5 mg/dL (ref 0.0–1.2)
Total Protein: 6.9 g/dL (ref 6.5–8.1)

## 2024-02-18 LAB — PHOSPHORUS: Phosphorus: 4 mg/dL (ref 2.5–4.6)

## 2024-02-18 LAB — CULTURE, RESPIRATORY W GRAM STAIN

## 2024-02-18 LAB — CBC
HCT: 33.7 % — ABNORMAL LOW (ref 39.0–52.0)
Hemoglobin: 9.8 g/dL — ABNORMAL LOW (ref 13.0–17.0)
MCH: 28.7 pg (ref 26.0–34.0)
MCHC: 29.1 g/dL — ABNORMAL LOW (ref 30.0–36.0)
MCV: 98.5 fL (ref 80.0–100.0)
Platelets: 448 10*3/uL — ABNORMAL HIGH (ref 150–400)
RBC: 3.42 MIL/uL — ABNORMAL LOW (ref 4.22–5.81)
RDW: 15 % (ref 11.5–15.5)
WBC: 15.6 10*3/uL — ABNORMAL HIGH (ref 4.0–10.5)
nRBC: 0 % (ref 0.0–0.2)

## 2024-02-18 LAB — BASIC METABOLIC PANEL WITH GFR
Anion gap: 9 (ref 5–15)
BUN: 22 mg/dL — ABNORMAL HIGH (ref 6–20)
CO2: 32 mmol/L (ref 22–32)
Calcium: 8.8 mg/dL — ABNORMAL LOW (ref 8.9–10.3)
Chloride: 100 mmol/L (ref 98–111)
Creatinine, Ser: 0.58 mg/dL — ABNORMAL LOW (ref 0.61–1.24)
GFR, Estimated: >60 mL/min (ref >60)
Glucose, Bld: 172 mg/dL — ABNORMAL HIGH (ref 70–99)
Potassium: 4.3 mmol/L (ref 3.5–5.1)
Sodium: 141 mmol/L (ref 135–145)

## 2024-02-18 LAB — MAGNESIUM: Magnesium: 2.1 mg/dL (ref 1.7–2.4)

## 2024-02-18 NOTE — Progress Notes (Signed)
 Progress Note    Garrett [name]   FMW:[id number]  DOB: 06/14/1976  DOA: 10/27/2023     114 PCP: Pcp, No  Initial CC: found unresponsive   Hospital Course: 48 year old with no known previous medical history was found down on 10/27/2023, unresponsive with UDS positive for opioids.  He was minimally responsive to Narcan  and he was found to be markedly hypertensive-  blood pressure 262/156.  CT head notable for 4.1 cm intracranial hemorrhage at the pons with intraventricular extension into the fourth ventricle and basal cisterns.  Initially admitted by critical care, and then extubated with tracheostomy, initially placed on 3/20, replaced on 01/05/2024.  PEG tube placed 11/08/2023.   Remains with poor response and poor prognosis at this time Now decannulated due to difficulty with passing suction catheter and being monitored for any decompensation.  PEG in place.    Significant hospital events: 3/8: Intubated in ED, pontine bleed/SAH. CT head Acute large hemorrhage 4.1 cm in pons with intraventricular extension without hydrocephalus. 3/9: CTA No change in IPH in pons, similar biparietal [facility] 3/14: Now awake, appears locked in syndrome.  Can follow commands with eyes. 3/16 Purposeful with left upper extremity  3/22 tracheostomy placed at bedside, bleeding issues overnight from trach site 3/24 PEG 3/24 Sputum culture with MRSA 4/20: trach was changed to cuffless #8 5/1: Mental status appears to be somewhat improving off narcotics, more awake alert following simple commands(able to grimace, move left toes, track vertically with right eye).  Speech following. Trach downsized to [employer] cuffless #6 XLT 5/4: Modafinil  held, low threshold to resume if mental status/ability to interact diminishes 5/10: PEG dislodged, foley bulb placed and IR consulted to replace tube 5/11: IR replaced PEG tube 5/13 - 5/14: + Fever.  Restarted antibiotics.  Urine culture + E. Coli, foley catheter changed. 5/17  Tracheostomy changed.   5/18: Completed antibiotics for UTI 5/23: Stable, no significant change, awaiting disability for placement. 6-11 Tracheostomy de cannulated 01/29/2024 after RT couldn't pass suction catheter.  6/27: placed on 3 L oxygen overnight, tachypnea, increased secretion. Started IV Unasyn . CCM consulted.  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  CT head with acute large hemorrhage 4.1 cm in the pons, ventricular extension. blood pressure 260/1 50s on admission. Was admitted to the ICU and followed by Neuro.  - 2D echo ejection fraction 60%, A1c 4.7, LDL 96. -He was also treated with 3% hypertonic saline.  Follow-up CT head 3/29 with reduction in size of hemorrhage. - PEG dependent; poor prognosis - need repeat GOC discussions given no meaningful improvement now in 3+ months; POOR PROGNOSIS  Acute Respiratory Failure secondary to large pontine stroke brainstem compression Unable to protect airway/possible aspiration pneumonia - ABX completed  -De cannulated 6/11 after RT could pass suction catheter. CCM following, plan to monitor for now, may need tracheostomy in placed in future  per CCM - Per CCM if patient developed respiratory distress, inability to maintain his airway or trouble clearing secretions will need to have low  threshold to reintubate. -6/27: Patient develop respiratory distress, increase secretions, placed on 3 L oxygen. Concern  for PNA. Started on IV Unasyn . CCM re-consulted. Chest x ray: under inflation, enlarge cardiac  silhouette.  -6/28-- requiring HF oxygen 15 L. Culture growing Staph and Klebsiella. Continue with  Vancomycin  and Cefepime .  Continue suctioning. Appreciate CCM assistance.  Continue with current management WBC trending down, febrile today, Oxygen requirement decreased to 7 L.   Staph, klebsiella  Pneumonia:  Develops worsening secretions and Increase oxygen requirement.  Continue with vancomycin  and cefepime .    E. coli UTI  Chronic foley Completed antibiotics course 5/18 Foley catheter was changed 5/13. 6/12.  Needs to exchange foley 7/12   Hypertension Continue Norvasc , Losartan , Lopressor  Continue with Hydralazine  BID Discussed with Nurse, might have to hold some BP meds depending on BP reading.   Transaminases Improved.  CT abdomen pelvis negative, acute hepatitis panel negative   Moderate to severe protein caloric malnutrition, POA Peg in place, TF per RD.    PEG tube malfunction, resolved PEG dislodged 5/10, replaced on 5/11 by IR   Exposure keratopathy, OS Seen by ophthalmology, Dr. Maree on 4/7.  Treated with maxitrol ointment (neo-poly-dex) TID OS for 1 week. -- Refresh PM TID to QID OS for chronic lubrication   AKI with acute urinary retention Resolved.  Has Foley in placed   Macrocytic anemia thrombocytosis  stable/resolved   Perineal skin injury, POA  Continue Gerhard's Butt cream   Relative B12 and folate deficiency Continue replacement with MVI    Goals of care - Dr Patsy Discussed with neurology on 6/18; given no major improvement since admission, his prognosis is considered poor at this time; he has no meaningful recovery nor QOL. - Family meeting held with Dr Patsy mom and his fiance in person and his dad was on the phone on 02/09/2024; they have at least decided that he is NOT safe for returning home with 24/7 care and they would like to pursue  placement once found.  They were not willing to consider de-escalation of care despite discussion of poor prognosis and extremely low chance of any meaningful recovery   Nutrition Problem: Inadequate oral intake Etiology: inability to eat   Interventions: Tube feeding, Prostat, MVI, Juven Nutrition Problem: Inadequate oral intake Etiology: inability to eat Signs/Symptoms: NPO status Interventions: Tube feeding, Prostat, MVI, Juven   Estimated body mass index is 31.17 kg/m as calculated from the following:   Height as of this encounter: 5' 8 (1.727 m).   Weight as of this encounter: 93 kg.  Interval History:  Not following command.  Febrile this morning.    DVT prophylaxis:  enoxaparin  (LOVENOX ) injection 40 mg Start: 01/17/24 1000 SCDs Start: 10/27/23 2226   Code Status:   Code Status: Do not attempt resuscitation (DNR) PRE-ARREST INTERVENTIONS DESIRED  Mobility Assessment (Last 72 Hours)     Mobility Assessment     Row Name 02/17/24 2000 02/16/24 2000 02/16/24 1600 02/16/24 1200 02/16/24 0814   Does patient have an order for bedrest or is patient medically unstable Yes- Bedfast (Level 1) - Complete Yes- Bedfast (Level 1) - Complete Yes- Bedfast (Level 1) - Complete Yes- Bedfast (Level 1) - Complete Yes- Bedfast (Level 1) - Complete   What is the highest level of mobility based on the progressive mobility assessment? Level 1 (Bedfast) - Unable to balance while sitting on edge of bed Level 1 (Bedfast) - Unable to balance while sitting on edge of bed Level 1 (Bedfast) -  Unable to balance while sitting on edge of bed Level 1 (Bedfast) - Unable to balance while sitting on edge of bed Level 1 (Bedfast) - Unable to balance while sitting on edge of bed   Is the above level different from baseline mobility prior to current illness? -- -- -- -- Yes - Recommend PT order    Row Name 02/15/24 2000           Does patient have an order for bedrest or is patient medically unstable Yes-  Bedfast (Level 1) - Complete       What is the highest level of mobility based on the progressive mobility assessment? Level 1 (Bedfast) - Unable to balance while sitting on edge of bed          Barriers to discharge: Getting insurance Disposition Plan:  SNF once barriers resolved Status is: Inpt  Objective: Blood pressure 138/81, pulse 97, temperature (!) 101.2 F (38.4 C), temperature source Oral, resp. rate 20, height 5' 8 (1.727 m), weight 92 kg, SpO2 99%.  Examination: eyes open, does not follows command General; no acute distress CVS; S 1, S 2 RRR Lungs: tachypnea, BL ronchus.  Neuro:  eyes open, does not follows command  Consultants:  Palliative care  Procedures:    Data Reviewed: Results for orders placed or performed during the hospital encounter of 10/27/23 (from the past 24 hours)  Glucose, capillary     Status: None   Collection Time: 02/17/24  3:26 PM  Result Value Ref Range   Glucose-Capillary 85 70 - 99 mg/dL  Glucose, capillary     Status: Abnormal   Collection Time: 02/17/24  5:22 PM  Result Value Ref Range   Glucose-Capillary 122 (H) 70 - 99 mg/dL  Glucose, capillary     Status: Abnormal   Collection Time: 02/17/24  9:55 PM  Result Value Ref Range   Glucose-Capillary 117 (H) 70 - 99 mg/dL   Comment 1 Notify RN    Comment 2 Document in Chart   CBC with Differential/Platelet     Status: Abnormal   Collection Time: 02/17/24 11:40 PM  Result Value Ref Range   WBC 13.3 (H) 4.0 - 10.5 K/uL   RBC 3.61 (L) 4.22 - 5.81 MIL/uL   Hemoglobin 10.1 (L) 13.0 - 17.0 g/dL   HCT 64.7 (L) 60.9 - 47.9 %   MCV 97.5 80.0 - 100.0 fL   MCH 28.0 26.0 - 34.0 pg   MCHC 28.7 (L) 30.0 - 36.0 g/dL   RDW 85.0 88.4 - 84.4 %   Platelets 453 (H) 150 - 400 K/uL   nRBC 0.0 0.0 - 0.2 %   Neutrophils Relative % 76 %   Neutro Abs 10.1 (H) 1.7 - 7.7 K/uL   Lymphocytes Relative 14 %   Lymphs Abs 1.8 0.7 - 4.0 K/uL   Monocytes Relative 9 %   Monocytes Absolute 1.2 (H) 0.1 - 1.0  K/uL   Eosinophils Relative 1 %   Eosinophils Absolute 0.1 0.0 - 0.5 K/uL   Basophils Relative 0 %   Basophils Absolute 0.0 0.0 - 0.1 K/uL   Immature Granulocytes 0 %   Abs Immature Granulocytes 0.05 0.00 - 0.07 K/uL  Comprehensive metabolic panel with GFR     Status: Abnormal   Collection Time: 02/17/24 11:40 PM  Result Value Ref Range   Sodium 143 135 - 145 mmol/L   Potassium 4.3 3.5 - 5.1 mmol/L   Chloride 101 98 - 111 mmol/L   CO2  31 22 - 32 mmol/L   Glucose, Bld 181 (H) 70 - 99 mg/dL   BUN 27 (H) 6 - 20 mg/dL   Creatinine, Ser 9.43 (L) 0.61 - 1.24 mg/dL   Calcium 9.0 8.9 - 89.6 mg/dL   Total Protein 6.9 6.5 - 8.1 g/dL   Albumin  2.6 (L) 3.5 - 5.0 g/dL   AST 57 (H) 15 - 41 U/L   ALT 154 (H) 0 - 44 U/L   Alkaline Phosphatase 73 38 - 126 U/L   Total Bilirubin 0.5 0.0 - 1.2 mg/dL   GFR, Estimated >39 >39 mL/min   Anion gap 11 5 - 15  Magnesium     Status: None   Collection Time: 02/17/24 11:40 PM  Result Value Ref Range   Magnesium 2.1 1.7 - 2.4 mg/dL  Phosphorus     Status: None   Collection Time: 02/17/24 11:40 PM  Result Value Ref Range   Phosphorus 4.0 2.5 - 4.6 mg/dL  Glucose, capillary     Status: Abnormal   Collection Time: 02/18/24  6:15 AM  Result Value Ref Range   Glucose-Capillary 113 (H) 70 - 99 mg/dL   Comment 1 Notify RN    Comment 2 Document in Chart   Glucose, capillary     Status: Abnormal   Collection Time: 02/18/24 10:05 AM  Result Value Ref Range   Glucose-Capillary 149 (H) 70 - 99 mg/dL   Comment 1 Notify RN    Comment 2 Document in Chart         LOS: 114 days   Owen DELENA Lore, MD [facility] 02/18/2024, 1:22 PM

## 2024-02-18 NOTE — Plan of Care (Signed)
 No evidence of hearing conversation. Tube feeding as ordered. Tylenol  for fever. Vancomycin  as ordered.     Problem: Coping: Goal: Will verbalize positive feelings about self Outcome: Progressing   Problem: Nutrition: Goal: Risk of aspiration will decrease Outcome: Progressing   Problem: Skin Integrity: Goal: Risk for impaired skin integrity will decrease Outcome: Progressing   Problem: Safety: Goal: Ability to remain free from injury will improve Outcome: Progressing

## 2024-02-18 NOTE — Plan of Care (Signed)
  Problem: Intracerebral Hemorrhage Tissue Perfusion: Goal: Complications of Intracerebral Hemorrhage will be minimized Outcome: Progressing   Problem: Nutrition: Goal: Risk of aspiration will decrease Outcome: Progressing

## 2024-02-18 NOTE — Plan of Care (Signed)
  Problem: Intracerebral Hemorrhage Tissue Perfusion: Goal: Complications of Intracerebral Hemorrhage will be minimized 02/18/2024 1800 by Vivian Carlos BIRCH, RN Outcome: Progressing 02/18/2024 1622 by Vivian Carlos BIRCH, RN Outcome: Progressing   Problem: Coping: Goal: Will verbalize positive feelings about self 02/18/2024 1800 by Vivian Carlos BIRCH, RN Outcome: Progressing 02/18/2024 1622 by Vivian Carlos BIRCH, RN Outcome: Progressing   Problem: Skin Integrity: Goal: Risk for impaired skin integrity will decrease 02/18/2024 1800 by Vivian Carlos BIRCH, RN Outcome: Progressing 02/18/2024 1622 by Vivian Carlos BIRCH, RN Outcome: Progressing   Problem: Education: Goal: 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 02/18/2024 1800 by Vivian Carlos BIRCH, RN Outcome: Progressing 02/18/2024 1622 by Vivian Carlos BIRCH, RN Outcome: Progressing

## 2024-02-18 NOTE — Progress Notes (Signed)
 NAME:  Garrett [name], MRN:  [id number], DOB:  05/09/1976, LOS: 114 ADMISSION DATE:  10/27/2023, CONSULTATION DATE:  10/27/23 REFERRING MD:  Dr Madelyne, CHIEF COMPLAINT:  Found down   History of Present Illness:  48 y/o male with past medical history of hypertension who was found down for unknown downtime by his fiance when she came home from work. Reportedly, having agonal breathing.  He apparently had driven her to work this morning.  He has HTN but never checks his BP and does not follow with MD.  She says you can tell when his BP is really high; his eyes get blood shot and he is sweating.  No h/o seizures.  Besides almost daily Garrett [name] and cigarette smoking, she is not aware of any other drugs. Drug screen positive for opioids and he was given several doses of Narcan . Patient found to have:  1. 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 Basal cisterns and fourth ventricle are effaced without hydrocephalus at this time. 2. Trace additional subarachnoid hemorrhage along the left parietal convexity. BP in ED 262/156. His CXR revealed a RUL and perihilar infiltrates suggesting aspiration pneumonia. ED labs also revealed PH 7.169, LA 4.4, CPK 985. Patient was intubated in ED.  Pertinent  Medical History  Hypertension  Significant Hospital Events: Including procedures, antibiotic start and stop dates in addition to other pertinent events   3/8: Intubated in ED, pontine bleed/SAH. CT head 17:09 Acute large hemorrhage 4.1 cm in pons with intraventricular extension without hydrocephalus 3/9: CTA 03:14 AM No change in IPH in pons, similar biparietal [facility] 3/14: Now awake, appears locked in can follow commands with eyes. 3/16 Purposeful with left upper extremity  3/22 tracheostomy placed at bedside, bleeding issues overnight from trach site 3/24 PEG (Dr. Sebastian) 3/24 sputum  culture with MRSA 3/27 vomited. TF on hold. Abd film c/w ileus. Added reglan , got SSE. Did tolerate PSV all day  3/28 BMs x2 after SSE day prior. Added back TFs at 1/2 rated. Tolerated PS 3/29 Tolerated PS  3/31 Tolerating CPAP PS 15/5, TF on hold due to ileus. 4/1: con't to hold tube feeds today, restarting vancomycin  and sending tracheal aspirate as peaks are 37, plat 24, driving 19. Thick secretions. Fever overnight.  4/2: peaks improved. Ongoing hiccups. Trickle feeding. Neuro exam unchanged.  4/20: trach was changed to cuffless #8 4/28: not safe to swallow. Limited communication and he follows simple commands. On TF 5/1: on TC 28%, with large secretions. Working with speech and he is improving to initiate PMV trials under ST supervision   5/5: on TC 30%, 7 L/min. Trach #6 cuffless, changed 5/1 to facilitate PMV trials  5/12: on TC 21%, 6 L/min. Trach #6 cuffless, unable to produce sounds or speak on PMV. No significant resp secretions. On PEG TF 01/29/2024 tracheostomy was removed due to failure to well provide adequate airway 02/15/2024 pulmonary critical care consult.  Interim History / Subjective:  Remains tachypneic, low grade fevers. Did diurese.  Objective    Blood pressure (!) 139/91, pulse (!) 113, temperature (!) 101.3 F (38.5 C), temperature source Oral, resp. rate 18, height 5' 8 (1.727 m), weight 92 kg, SpO2 100%.        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

## 2024-02-19 ENCOUNTER — Inpatient Hospital Stay (HOSPITAL_COMMUNITY)

## 2024-02-19 DIAGNOSIS — I619 Nontraumatic intracerebral hemorrhage, unspecified: Secondary | ICD-10-CM | POA: Diagnosis not present

## 2024-02-19 LAB — BASIC METABOLIC PANEL WITH GFR
Anion gap: 9 (ref 5–15)
BUN: 21 mg/dL — ABNORMAL HIGH (ref 6–20)
CO2: 30 mmol/L (ref 22–32)
Calcium: 9 mg/dL (ref 8.9–10.3)
Chloride: 100 mmol/L (ref 98–111)
Creatinine, Ser: 0.47 mg/dL — ABNORMAL LOW (ref 0.61–1.24)
GFR, Estimated: >60 mL/min (ref >60)
Glucose, Bld: 162 mg/dL — ABNORMAL HIGH (ref 70–99)
Potassium: 4.1 mmol/L (ref 3.5–5.1)
Sodium: 139 mmol/L (ref 135–145)

## 2024-02-19 LAB — GLUCOSE, CAPILLARY
Glucose-Capillary: 122 mg/dL — ABNORMAL HIGH (ref 70–99)
Glucose-Capillary: 150 mg/dL — ABNORMAL HIGH (ref 70–99)
Glucose-Capillary: 154 mg/dL — ABNORMAL HIGH (ref 70–99)
Glucose-Capillary: 161 mg/dL — ABNORMAL HIGH (ref 70–99)
Glucose-Capillary: 137 mg/dL — ABNORMAL HIGH (ref 70–99)

## 2024-02-19 LAB — CBC
HCT: 32.2 % — ABNORMAL LOW (ref 39.0–52.0)
Hemoglobin: 9.4 g/dL — ABNORMAL LOW (ref 13.0–17.0)
MCH: 27.7 pg (ref 26.0–34.0)
MCHC: 29.2 g/dL — ABNORMAL LOW (ref 30.0–36.0)
MCV: 95 fL (ref 80.0–100.0)
Platelets: 430 10*3/uL — ABNORMAL HIGH (ref 150–400)
RBC: 3.39 MIL/uL — ABNORMAL LOW (ref 4.22–5.81)
RDW: 15 % (ref 11.5–15.5)
WBC: 15.2 10*3/uL — ABNORMAL HIGH (ref 4.0–10.5)
nRBC: 0 % (ref 0.0–0.2)

## 2024-02-19 MED ORDER — MUPIROCIN 2 % EX OINT
1.0000 | TOPICAL_OINTMENT | Freq: Two times a day (BID) | CUTANEOUS | Status: AC
  Administered 2024-02-19 – 2024-02-23 (×10): 1 via NASAL
  Filled 2024-02-19 (×2): qty 22

## 2024-02-19 NOTE — Progress Notes (Signed)
 Progress Note    Garrett [name]   FMW:[id number]  DOB: 04/14/1976  DOA: 10/27/2023     115 PCP: Pcp, No  Initial CC: found unresponsive   Hospital Course: 48 year old with no known previous medical history was found down on 10/27/2023, unresponsive with UDS positive for opioids.  He was minimally responsive to Narcan  and he was found to be markedly hypertensive-  blood pressure 262/156.  CT head notable for 4.1 cm intracranial hemorrhage at the pons with intraventricular extension into the fourth ventricle and basal cisterns.  Initially admitted by critical care, and then extubated with tracheostomy, initially placed on 3/20, replaced on 01/05/2024.  PEG tube placed 11/08/2023.   Remains with poor response and poor prognosis at this time Now decannulated due to difficulty with passing suction catheter and being monitored for any decompensation.  PEG in place.    Significant hospital events: 3/8: Intubated in ED, pontine bleed/SAH. CT head Acute large hemorrhage 4.1 cm in pons with intraventricular extension without hydrocephalus. 3/9: CTA No change in IPH in pons, similar biparietal [facility] 3/14: Now awake, appears locked in syndrome.  Can follow commands with eyes. 3/16 Purposeful with left upper extremity  3/22 tracheostomy placed at bedside, bleeding issues overnight from trach site 3/24 PEG 3/24 Sputum culture with MRSA 4/20: trach was changed to cuffless #8 5/1: Mental status appears to be somewhat improving off narcotics, more awake alert following simple commands(able to grimace, move left toes, track vertically with right eye).  Speech following. Trach downsized to [employer] cuffless #6 XLT 5/4: Modafinil  held, low threshold to resume if mental status/ability to interact diminishes 5/10: PEG dislodged, foley bulb placed and IR consulted to replace tube 5/11: IR replaced PEG tube 5/13 - 5/14: + Fever.  Restarted antibiotics.  Urine culture + E. Coli, foley catheter changed. 5/17  Tracheostomy changed.   5/18: Completed antibiotics for UTI 5/23: Stable, no significant change, awaiting disability for placement. 6-11 Tracheostomy de cannulated 01/29/2024 after RT couldn't pass suction catheter.  6/27: placed on 3 L oxygen overnight, tachypnea, increased secretion. Started IV Unasyn . CCM consulted.  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  CT head with acute large hemorrhage 4.1 cm in the pons, ventricular extension. blood pressure 260/1 50s on admission. Was admitted to the ICU and followed by Neuro.  - 2D echo ejection fraction 60%, A1c 4.7, LDL 96. -He was also treated with 3% hypertonic saline.  Follow-up CT head 3/29 with reduction in size of hemorrhage. - PEG dependent; poor prognosis   Acute Respiratory Failure secondary to large pontine stroke brainstem compression Unable to protect airway/possible aspiration pneumonia - ABX completed  -De cannulated 6/11 after RT could pass suction catheter. CCM following, plan to monitor for now, may need tracheostomy in placed in future  per CCM - Per CCM if patient developed respiratory distress, inability to maintain his airway or trouble clearing secretions will need to have low threshold to reintubate. -6/27: Patient developed respiratory distress, increase secretions, placed on 3 L oxygen.  Concern for PNA. Started on IV Unasyn . CCM re-consulted. Chest x ray: under inflation, enlarge cardiac   silhouette.  -6/28-- Requiring HF oxygen 15 L. Culture growing Staph and Klebsiella. Continue with  Vancomycin  and Cefepime .  -Continue suctioning. Appreciate CCM assistance.  Continue with current management , still having fever, Oxygen requirement decreased to 7 L. WBC fluctuates.   MRSA, klebsiella  Pneumonia:  Develops worsening secretions and Increase oxygen requirement.  Continue with vancomycin  and cefepime .    E. coli UTI  Chronic foley Completed antibiotics course 5/18 Foley catheter was changed 5/13. 6/12.  Needs to exchange foley 7/12   Hypertension Continue Norvasc , Losartan , Lopressor  Continue with Hydralazine  BID Discussed with Nurse, might have to hold some BP meds depending on BP reading.   Transaminases Improved.  CT abdomen pelvis negative, acute hepatitis panel negative   Moderate to severe protein caloric malnutrition, POA Peg in place, TF per RD.    PEG tube malfunction, resolved PEG dislodged 5/10, replaced on 5/11 by IR   Exposure keratopathy, OS Seen by ophthalmology, Dr. Maree on 4/7.  Treated with maxitrol ointment (neo-poly-dex) TID OS for 1 week. -- Refresh PM TID to QID OS for chronic lubrication   AKI with acute urinary retention Resolved.  Has Foley in placed   Macrocytic anemia thrombocytosis  stable/resolved   Perineal skin injury, POA  Continue Gerhard's Butt cream   Relative B12 and folate deficiency Continue replacement with MVI    Goals of care - Dr Patsy Discussed with neurology on 6/18; given no major improvement since admission, his prognosis is considered poor at this time; he has no meaningful recovery nor QOL. - Family meeting held with Dr Patsy mom and his fiance in person and his dad was on the phone on 02/09/2024; they have at least decided that he is NOT safe for returning home with 24/7 care and they would like to pursue placement once found.  They were not willing to consider de-escalation of care despite discussion  of poor prognosis and extremely low chance of any meaningful recovery   Nutrition Problem: Inadequate oral intake Etiology: inability to eat   Interventions: Tube feeding, Prostat, MVI, Juven Nutrition Problem: Inadequate oral intake Etiology: inability to eat Signs/Symptoms: NPO status Interventions: Tube feeding, Prostat, MVI, Juven   Estimated body mass index is 31.17 kg/m as calculated from the following:   Height as of this encounter: 5' 8 (1.727 m).   Weight as of this encounter: 93 kg.  Interval History:  Not following command.  Febrile this morning.    DVT prophylaxis:  enoxaparin  (LOVENOX ) injection 40 mg Start: 01/17/24 1000 SCDs Start: 10/27/23 2226   Code Status:   Code Status: Do not attempt resuscitation (DNR) PRE-ARREST INTERVENTIONS DESIRED  Mobility Assessment (Last 72 Hours)     Mobility Assessment     Row Name 02/18/24 1945 02/18/24 0800 02/17/24 2000 02/16/24 2000 02/16/24 1600   Does patient have an order for bedrest or is patient medically unstable No - Continue assessment Yes- Bedfast (Level 1) - Complete Yes- Bedfast (Level 1) - Complete Yes- Bedfast (Level 1) - Complete Yes- Bedfast (Level 1) - Complete   What is the highest level of mobility based on the progressive mobility assessment? Level 1 (Bedfast) - Unable to balance while sitting on edge of bed Level 1 (Bedfast) - Unable to balance while sitting on edge of bed Level 1 (Bedfast) - Unable to balance while sitting on edge of bed Level 1 (Bedfast) - Unable to  balance while sitting on edge of bed Level 1 (Bedfast) - Unable to balance while sitting on edge of bed   Is the above level different from baseline mobility prior to current illness? -- Yes - Recommend PT order -- -- --      Barriers to discharge: Getting insurance Disposition Plan:  SNF once barriers resolved Status is: Inpt  Objective: Blood pressure 130/82, pulse 81, temperature 99.8 F (37.7 C), temperature source Oral, resp. rate  17, height 5' 8 (1.727 m), weight 97 kg, SpO2 100%.  Examination: Does not follows command General; no acute distress CVS; S 1, S 2 RRR Lungs: tachypnea, BL ronchus Neuro:  eyes open, does not follows command  Consultants:  Palliative care  Procedures:    Data Reviewed: Results for orders placed or performed during the hospital encounter of 10/27/23 (from the past 24 hours)  CBC     Status: Abnormal   Collection Time: 02/18/24  1:35 PM  Result Value Ref Range   WBC 15.6 (H) 4.0 - 10.5 K/uL   RBC 3.42 (L) 4.22 - 5.81 MIL/uL   Hemoglobin 9.8 (L) 13.0 - 17.0 g/dL   HCT 66.2 (L) 60.9 - 47.9 %   MCV 98.5 80.0 - 100.0 fL   MCH 28.7 26.0 - 34.0 pg   MCHC 29.1 (L) 30.0 - 36.0 g/dL   RDW 84.9 88.4 - 84.4 %   Platelets 448 (H) 150 - 400 K/uL   nRBC 0.0 0.0 - 0.2 %  Basic metabolic panel with GFR     Status: Abnormal   Collection Time: 02/18/24  1:35 PM  Result Value Ref Range   Sodium 141 135 - 145 mmol/L   Potassium 4.3 3.5 - 5.1 mmol/L   Chloride 100 98 - 111 mmol/L   CO2 32 22 - 32 mmol/L   Glucose, Bld 172 (H) 70 - 99 mg/dL   BUN 22 (H) 6 - 20 mg/dL   Creatinine, Ser 9.41 (L) 0.61 - 1.24 mg/dL   Calcium 8.8 (L) 8.9 - 10.3 mg/dL   GFR, Estimated >39 >39 mL/min   Anion gap 9 5 - 15  Glucose, capillary     Status: Abnormal   Collection Time: 02/18/24  1:57 PM  Result Value Ref Range   Glucose-Capillary 152 (H) 70 - 99 mg/dL   Comment 1 Notify RN    Comment 2 Document in Chart   Glucose, capillary     Status: Abnormal   Collection Time: 02/18/24  6:30 PM  Result Value Ref Range   Glucose-Capillary 159 (H) 70 - 99 mg/dL   Comment 1 Notify RN    Comment 2 Document in Chart   Glucose, capillary     Status: Abnormal   Collection Time: 02/18/24  9:06 PM  Result Value Ref Range   Glucose-Capillary 161 (H) 70 - 99 mg/dL   Comment 1 Notify RN    Comment 2 Document in Chart   Glucose, capillary     Status: Abnormal   Collection Time: 02/19/24  6:08 AM  Result Value Ref  Range   Glucose-Capillary 122 (H) 70 - 99 mg/dL   Comment 1 Notify RN    Comment 2 Document in Chart   Glucose, capillary     Status: Abnormal   Collection Time: 02/19/24  9:54 AM  Result Value Ref Range   Glucose-Capillary 150 (H) 70 - 99 mg/dL   Comment 1 Notify RN    Comment 2 Document in Chart   CBC  Status: Abnormal   Collection Time: 02/19/24 12:25 PM  Result Value Ref Range   WBC 15.2 (H) 4.0 - 10.5 K/uL   RBC 3.39 (L) 4.22 - 5.81 MIL/uL   Hemoglobin 9.4 (L) 13.0 - 17.0 g/dL   HCT 67.7 (L) 60.9 - 47.9 %   MCV 95.0 80.0 - 100.0 fL   MCH 27.7 26.0 - 34.0 pg   MCHC 29.2 (L) 30.0 - 36.0 g/dL   RDW 84.9 88.4 - 84.4 %   Platelets 430 (H) 150 - 400 K/uL   nRBC 0.0 0.0 - 0.2 %        LOS: 115 days   Owen DELENA Lore, MD [facility] 02/19/2024, 1:13 PM

## 2024-02-19 NOTE — Plan of Care (Signed)
 Talked with girlfriend about giving Tylenol  for fever today.  Twitching above R brow makes her feel like it's directed towards her.    Problem: Education: Goal: Knowledge of disease or condition will improve Outcome: Progressing   Problem: Intracerebral Hemorrhage Tissue Perfusion: Goal: Complications of Intracerebral Hemorrhage will be minimized Outcome: Progressing   Problem: Coping: Goal: Will verbalize positive feelings about self Outcome: Progressing   Problem: Skin Integrity: Goal: Risk for impaired skin integrity will decrease Outcome: Progressing   Problem: Respiratory: Goal: Patent airway maintenance will improve Outcome: Progressing   Problem: Nutrition: Goal: Adequate nutrition will be maintained Outcome: Progressing

## 2024-02-19 NOTE — Progress Notes (Signed)
 Discussed with girlfriend that all staff and visitors are to wear protective covering due to MRSA

## 2024-02-19 NOTE — Progress Notes (Signed)
 Pharmacy Antibiotic Note  Garrett [name] is a 48 y.o. male with possible pneumonia on Unasyn .  Respiratory status worsening and he is noted with recent MRSA PNA> Pharmacy has been consulted for cefepime  and vancomycin  dosing.  Plan: Vancomycin  1500 mg IV q12h, (eAUC 470, goal AUC 400-550) Cefepime  2g IV Q8h  Trend WBC, fever, renal function > total 7 days therapy per CCM F/u cultures, clinical progress, levels as indicated  Height: 5' 8 (172.7 cm) Weight: 97 kg (213 lb 13.5 oz) IBW/kg (Calculated) : 68.4  Temp (24hrs), Avg:99.7 F (37.6 C), Min:99 F (37.2 C), Max:100.1 F (37.8 C)  Recent Labs  Lab 02/15/24 1113 02/16/24 0537 02/16/24 1156 02/17/24 0724 02/17/24 2340 02/18/24 1335  WBC 15.4*  --  22.7* 18.0* 13.3* 15.6*  CREATININE 0.53* 0.55*  --  0.59* 0.56* 0.58*    Estimated Creatinine Clearance: 128.8 mL/min (A) (by C-G formula based on SCr of 0.58 mg/dL (L)).    Allergies  Allergen Reactions   Tomato Hives, Itching and Rash    Antimicrobials this admission: Unasyn  6/27 > 6/28 Cefepime  6/38 Vanc 6/28>>   Vanc 6/1 >> 6/8 - 5/14 TA - MRSA (colonization) - 6/2 TA- GNRs, GPCs, GPRs>>MRSA (micro said they did not see the GNR or GPR on cx   Microbiology results: 5/14 TA - MRSA (colonization) 6/02 TA- GNRs, GPCs, GPRs >> MRSA (micro said they did not see the GNR or GPR on cx  6/27 TA - MRSA, kleb oxytoca  Thank you for allowing pharmacy to be a part of this patient's care.  Shelba Collier, PharmD, BCPS Clinical Pharmacist

## 2024-02-20 DIAGNOSIS — I619 Nontraumatic intracerebral hemorrhage, unspecified: Secondary | ICD-10-CM | POA: Diagnosis not present

## 2024-02-20 LAB — BASIC METABOLIC PANEL WITH GFR
Anion gap: 8 (ref 5–15)
BUN: 22 mg/dL — ABNORMAL HIGH (ref 6–20)
CO2: 31 mmol/L (ref 22–32)
Calcium: 9.2 mg/dL (ref 8.9–10.3)
Chloride: 99 mmol/L (ref 98–111)
Creatinine, Ser: 0.61 mg/dL (ref 0.61–1.24)
GFR, Estimated: >60 mL/min (ref >60)
Glucose, Bld: 162 mg/dL — ABNORMAL HIGH (ref 70–99)
Potassium: 3.8 mmol/L (ref 3.5–5.1)
Sodium: 138 mmol/L (ref 135–145)

## 2024-02-20 LAB — GLUCOSE, CAPILLARY
Glucose-Capillary: 118 mg/dL — ABNORMAL HIGH (ref 70–99)
Glucose-Capillary: 159 mg/dL — ABNORMAL HIGH (ref 70–99)
Glucose-Capillary: 118 mg/dL — ABNORMAL HIGH (ref 70–99)
Glucose-Capillary: 114 mg/dL — ABNORMAL HIGH (ref 70–99)
Glucose-Capillary: 166 mg/dL — ABNORMAL HIGH (ref 70–99)

## 2024-02-20 LAB — CBC
HCT: 32.8 % — ABNORMAL LOW (ref 39.0–52.0)
Hemoglobin: 9.8 g/dL — ABNORMAL LOW (ref 13.0–17.0)
MCH: 28.5 pg (ref 26.0–34.0)
MCHC: 29.9 g/dL — ABNORMAL LOW (ref 30.0–36.0)
MCV: 95.3 fL (ref 80.0–100.0)
Platelets: 404 10*3/uL — ABNORMAL HIGH (ref 150–400)
RBC: 3.44 MIL/uL — ABNORMAL LOW (ref 4.22–5.81)
RDW: 14.9 % (ref 11.5–15.5)
WBC: 13.2 10*3/uL — ABNORMAL HIGH (ref 4.0–10.5)
nRBC: 0 % (ref 0.0–0.2)

## 2024-02-20 LAB — VANCOMYCIN, TROUGH: Vancomycin Tr: 20 ug/mL (ref 15–20)

## 2024-02-20 MED ORDER — VANCOMYCIN HCL 1250 MG/250ML IV SOLN
1250.0000 mg | Freq: Two times a day (BID) | INTRAVENOUS | Status: AC
  Administered 2024-02-21 – 2024-02-28 (×14): 1250 mg via INTRAVENOUS
  Filled 2024-02-20 (×15): qty 250

## 2024-02-20 NOTE — Progress Notes (Signed)
 Physical Therapy Treatment Patient Details Name: Garrett [name] MRN: [id number] DOB: 11/15/1975 Today's Date: 02/20/2024   History of Present Illness Pt is a 48 yo male who was found down 10/27/23 with agonal breathing. 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 Intubated 3/8, cortrak placed 3/14, trach placed 3/22, PEG placed 3/24. De-cannulated 6/10. PMH: HTN, smoker    PT Comments  Pt received in supine and able to tolerate transfer the the recliner. Pt positioned with pillows and blanket along spine to assist with improved posture. Pt demonstrates limited command following during session. SpO2 100% on 6L throughout session. Pt continues to benefit from PT services to progress toward functional mobility goals.     If plan is discharge home, recommend the following: Two people to help with walking and/or transfers;Two people to help with bathing/dressing/bathroom;Assistance with cooking/housework;Assistance with feeding;Direct supervision/assist for medications management;Direct supervision/assist for financial management;Assist for transportation;Help with stairs or ramp for entrance;Supervision due to cognitive status   Can travel by private vehicle        Equipment Recommendations  [place] lift;Hospital bed;Wheelchair (measurements PT);Wheelchair cushion (measurements PT);BSC/3in1;Other (comment)    Recommendations for Other Services       Precautions / Restrictions Precautions Precautions: Fall Recall of Precautions/Restrictions: Impaired Precaution/Restrictions Comments: PEG, abdominal binder, SBP < 160 Required Braces or Orthoses: Other Brace Splint/Cast: Bil resting hand splints Splint/Cast - Date Prophylactic Dressing Applied (if applicable): 11/02/23 Restrictions Weight Bearing Restrictions Per Provider Order:  No     Mobility  Bed Mobility Overal bed mobility: Needs Assistance Bed Mobility: Rolling Rolling: +2 for physical assistance, +2 for safety/equipment, Total assist         General bed mobility comments: TotalAx2 with use of bed pad    Transfers Overall transfer level: Needs assistance Equipment used: Ambulation equipment used Transfers: Bed to chair/wheelchair/BSC               Transfer via Lift Equipment: Maximove  Ambulation/Gait                   Stairs             Wheelchair Mobility     Tilt Bed    Modified Rankin (Stroke Patients Only) Modified Rankin (Stroke Patients Only) Pre-Morbid Rankin Score: No symptoms Modified Rankin: Severe disability     Balance Overall balance assessment: Needs assistance   Sitting balance-Leahy Scale: Zero Sitting balance - Comments: requires (A) with pillows to help align neck and trunk                                    Communication Communication Communication: Impaired Factors Affecting Communication: Difficulty expressing self  Cognition Arousal: Alert Behavior During Therapy: Flat affect   PT - Cognitive impairments: Difficult to assess Difficult to assess due to: Impaired communication                       Following commands: Impaired Following commands impaired: Follows one step commands inconsistently, Follows one step commands with increased time    Cueing Cueing Techniques: Verbal cues, Tactile cues, Visual cues  Exercises      General Comments General comments (skin integrity, edema, etc.): 100% FIO2 on 6L HFNC. pt appears to accessory breath.  Pertinent Vitals/Pain Pain Assessment Pain Assessment: Faces Faces Pain Scale: No hurt     PT Goals (current goals can now be found in the care plan section) Acute Rehab PT Goals Patient Stated Goal: unable to state PT Goal Formulation: Patient unable to participate in goal setting Time For Goal  Achievement: 02/28/24 Progress towards PT goals: Not progressing toward goals - comment (limited progress)    Frequency    Min 1X/week           Co-evaluation PT/OT/SLP Co-Evaluation/Treatment: Yes Reason for Co-Treatment: Complexity of the patient's impairments (multi-system involvement);For patient/therapist safety;Necessary to address cognition/behavior during functional activity;To address functional/ADL transfers PT goals addressed during session: Mobility/safety with mobility OT goals addressed during session: Proper use of Adaptive equipment and DME;ADL's and self-care SLP goals addressed during session: Communication;Cognition    AM-PAC PT 6 Clicks Mobility   Outcome Measure  Help needed turning from your back to your side while in a flat bed without using bedrails?: Total Help needed moving from lying on your back to sitting on the side of a flat bed without using bedrails?: Total Help needed moving to and from a bed to a chair (including a wheelchair)?: Total Help needed standing up from a chair using your arms (e.g., wheelchair or bedside chair)?: Total Help needed to walk in hospital room?: Total Help needed climbing 3-5 steps with a railing? : Total 6 Click Score: 6    End of Session Equipment Utilized During Treatment: Oxygen Activity Tolerance: Patient tolerated treatment well Patient left: in chair;with call bell/phone within reach Nurse Communication: Mobility status PT Visit Diagnosis: Muscle weakness (generalized) (M62.81);Other symptoms and signs involving the nervous system (R29.898);Hemiplegia and hemiparesis Hemiplegia - Right/Left: Right Hemiplegia - dominant/non-dominant: Dominant Hemiplegia - caused by: Nontraumatic intracerebral hemorrhage     Time: 8666-8576 PT Time Calculation (min) (ACUTE ONLY): 50 min  Charges:    $Therapeutic Activity: 8-22 mins PT General Charges $$ ACUTE PT VISIT: 1 Visit                     Darryle George,  PTA Acute Rehabilitation Services Secure Chat Preferred  [phone]    Darryle George 02/20/2024, 3:41 PM

## 2024-02-20 NOTE — Progress Notes (Signed)
 Progress Note    Garrett [name]   FMW:[id number]  DOB: 08-10-1976  DOA: 10/27/2023     116 PCP: Pcp, No  Initial CC: found unresponsive   Hospital Course: 48 year old with no known previous medical history was found down on 10/27/2023, unresponsive with UDS positive for opioids.  He was minimally responsive to Narcan  and he was found to be markedly hypertensive-  blood pressure 262/156.  CT head notable for 4.1 cm intracranial hemorrhage at the pons with intraventricular extension into the fourth ventricle and basal cisterns.  Initially admitted by critical care, and then extubated with tracheostomy, initially placed on 3/20, replaced on 01/05/2024.  PEG tube placed 11/08/2023.   Remains with poor response and poor prognosis at this time Now decannulated due to difficulty with passing suction catheter and being monitored for any decompensation.  PEG in place.    Significant hospital events: 3/8: Intubated in ED, pontine bleed/SAH. CT head Acute large hemorrhage 4.1 cm in pons with intraventricular extension without hydrocephalus. 3/9: CTA No change in IPH in pons, similar biparietal [facility] 3/14: Now awake, appears locked in syndrome.  Can follow commands with eyes. 3/16 Purposeful with left upper extremity  3/22 tracheostomy placed at bedside, bleeding issues overnight from trach site 3/24 PEG 3/24 Sputum culture with MRSA 4/20: trach was changed to cuffless #8 5/1: Mental status appears to be somewhat improving off narcotics, more awake alert following simple commands(able to grimace, move left toes, track vertically with right eye).  Speech following. Trach downsized to [employer] cuffless #6 XLT 5/4: Modafinil  held, low threshold to resume if mental status/ability to interact diminishes 5/10: PEG dislodged, foley bulb placed and IR consulted to replace tube 5/11: IR replaced PEG tube 5/13 - 5/14: + Fever.  Restarted antibiotics.  Urine culture + E. Coli, foley catheter changed. 5/17  Tracheostomy changed.   5/18: Completed antibiotics for UTI 5/23: Stable, no significant change, awaiting disability for placement. 6-11 Tracheostomy de cannulated 01/29/2024 after RT couldn't pass suction catheter.  6/27: placed on 3 L oxygen overnight, tachypnea, increased secretion. Started IV Unasyn . CCM consulted.  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  CT head with acute large hemorrhage 4.1 cm in the pons, ventricular extension. blood pressure 260/1 50s on admission. Was admitted to the ICU and followed by Neuro.  - 2D echo ejection fraction 60%, A1c 4.7, LDL 96. -He was also treated with 3% hypertonic saline.  Follow-up CT head 3/29 with reduction in size of hemorrhage. - PEG dependent; poor prognosis   Acute Respiratory Failure secondary to large pontine stroke brainstem compression Unable to protect airway/possible aspiration pneumonia - ABX completed  -De cannulated 6/11 after RT could pass suction catheter. CCM following, plan to monitor for now, may need tracheostomy in placed in future  per CCM - Per CCM if patient developed respiratory distress, inability to maintain his airway or trouble clearing secretions will need to have low threshold to reintubate. -6/27: Patient developed respiratory distress, increase secretions, placed on 3 L oxygen.  Concern for PNA. Started on IV Unasyn . CCM re-consulted. Chest x ray: under inflation, enlarge cardiac   silhouette.  -6/28-- Required  HF oxygen 15 L. Culture growing Staph and Klebsiella. Continue with  Vancomycin  and Cefepime .  -Continue suctioning. Appreciate CCM assistance.  Continue with current management , febrile.  Day 5 antibiotics, might need longer course than 7 days if fever persist.   MRSA, klebsiella  Pneumonia:  Develops worsening secretions and Increase oxygen requirement.  Continue with vancomycin  and cefepime . Day 5 Check chest x ray 7/03  E. coli UTI  Chronic foley Completed antibiotics course 5/18 Foley catheter was changed 5/13. 6/12.  Needs to exchange foley 7/12   Hypertension Continue Norvasc , Losartan , Lopressor  Continue with Hydralazine  BID Discussed with Nurse, might have to hold some BP meds depending on BP reading.   Transaminases Improved.  CT abdomen pelvis negative, acute hepatitis panel negative   Moderate to severe protein caloric malnutrition, POA Peg in place, TF per RD.    PEG tube malfunction, resolved PEG dislodged 5/10, replaced on 5/11 by IR   Exposure keratopathy, OS Seen by ophthalmology, Dr. Maree on 4/7.  Treated with maxitrol ointment (neo-poly-dex) TID OS for 1 week. -- Refresh PM TID to QID OS for chronic lubrication   AKI with acute urinary retention Resolved.  Has Foley in placed   Macrocytic anemia thrombocytosis  stable/resolved   Perineal skin injury, POA  Continue Gerhard's Butt cream   Relative B12 and folate deficiency Continue replacement with MVI    Goals of care - Dr Patsy Discussed with neurology on 6/18; given no major improvement since admission, his prognosis is considered poor at this time; he has no meaningful recovery nor QOL. - Family meeting held with Dr Patsy mom and his fiance in person and his dad was on the phone on 02/09/2024; they have at least decided that he is NOT safe for returning home with 24/7 care and they would like to pursue placement once found.  They were not willing to consider  de-escalation of care despite discussion of poor prognosis and extremely low chance of any meaningful recovery   Nutrition Problem: Inadequate oral intake Etiology: inability to eat   Interventions: Tube feeding, Prostat, MVI, Juven Nutrition Problem: Inadequate oral intake Etiology: inability to eat Signs/Symptoms: NPO status Interventions: Tube feeding, Prostat, MVI, Juven   Estimated body mass index is 31.17 kg/m as calculated from the following:   Height as of this encounter: 5' 8 (1.727 m).   Weight as of this encounter: 93 kg.  Interval History:  Not following command.  Febrile this morning.    DVT prophylaxis:  enoxaparin  (LOVENOX ) injection 40 mg Start: 01/17/24 1000 SCDs Start: 10/27/23 2226   Code Status:   Code Status: Do not attempt resuscitation (DNR) PRE-ARREST INTERVENTIONS DESIRED  Mobility Assessment (Last 72 Hours)     Mobility Assessment     Row Name 02/19/24 1930 02/19/24 0800 02/18/24 1945 02/18/24 0800 02/17/24 2000   Does patient have an order for bedrest or is patient medically unstable No - Continue assessment Yes- Bedfast (Level 1) - Complete No - Continue assessment Yes- Bedfast (Level 1) - Complete Yes- Bedfast (Level 1) - Complete   What is the highest level of mobility based on the progressive mobility assessment? Level 1 (Bedfast) - Unable to balance while sitting on edge of bed Level 1 (Bedfast) - Unable to balance while sitting on edge of bed Level 1 (Bedfast) - Unable to balance while sitting on edge  of bed Level 1 (Bedfast) - Unable to balance while sitting on edge of bed Level 1 (Bedfast) - Unable to balance while sitting on edge of bed   Is the above level different from baseline mobility prior to current illness? -- Yes - Recommend PT order -- Yes - Recommend PT order --      Barriers to discharge: Getting insurance Disposition Plan:  SNF once barriers resolved Status is: Inpt  Objective: Blood pressure (!) 138/99, pulse 88,  temperature (!) 100.9 F (38.3 C), temperature source Oral, resp. rate (!) 9, height 5' 8 (1.727 m), weight 97 kg, SpO2 100%.  Examination: Eyes open, des not follows command General; NA acute distress CVS; S 1, S 2 Tachycardic Lungs: tachypnea, BL ronchus Neuro:  eyes open, does not follows command  Consultants:  Palliative care  Procedures:    Data Reviewed: Results for orders placed or performed during the hospital encounter of 10/27/23 (from the past 24 hours)  CBC     Status: Abnormal   Collection Time: 02/19/24 12:25 PM  Result Value Ref Range   WBC 15.2 (H) 4.0 - 10.5 K/uL   RBC 3.39 (L) 4.22 - 5.81 MIL/uL   Hemoglobin 9.4 (L) 13.0 - 17.0 g/dL   HCT 67.7 (L) 60.9 - 47.9 %   MCV 95.0 80.0 - 100.0 fL   MCH 27.7 26.0 - 34.0 pg   MCHC 29.2 (L) 30.0 - 36.0 g/dL   RDW 84.9 88.4 - 84.4 %   Platelets 430 (H) 150 - 400 K/uL   nRBC 0.0 0.0 - 0.2 %  Basic metabolic panel     Status: Abnormal   Collection Time: 02/19/24 12:25 PM  Result Value Ref Range   Sodium 139 135 - 145 mmol/L   Potassium 4.1 3.5 - 5.1 mmol/L   Chloride 100 98 - 111 mmol/L   CO2 30 22 - 32 mmol/L   Glucose, Bld 162 (H) 70 - 99 mg/dL   BUN 21 (H) 6 - 20 mg/dL   Creatinine, Ser 9.52 (L) 0.61 - 1.24 mg/dL   Calcium 9.0 8.9 - 89.6 mg/dL   GFR, Estimated >39 >39 mL/min   Anion gap 9 5 - 15  Glucose, capillary     Status: Abnormal   Collection Time: 02/19/24  2:45 PM  Result Value Ref Range   Glucose-Capillary 154 (H) 70 - 99 mg/dL   Comment 1 Notify RN    Comment 2 Document in Chart   Glucose, capillary     Status: Abnormal   Collection Time: 02/19/24  6:17 PM  Result Value Ref Range   Glucose-Capillary 161 (H) 70 - 99 mg/dL   Comment 1 Notify RN    Comment 2 Document in Chart   Glucose, capillary     Status: Abnormal   Collection Time: 02/19/24  9:41 PM  Result Value Ref Range   Glucose-Capillary 137 (H) 70 - 99 mg/dL  Glucose, capillary     Status: Abnormal   Collection Time: 02/20/24  6:19 AM   Result Value Ref Range   Glucose-Capillary 118 (H) 70 - 99 mg/dL  CBC     Status: Abnormal   Collection Time: 02/20/24  9:05 AM  Result Value Ref Range   WBC 13.2 (H) 4.0 - 10.5 K/uL   RBC 3.44 (L) 4.22 - 5.81 MIL/uL   Hemoglobin 9.8 (L) 13.0 - 17.0 g/dL   HCT 67.1 (L) 60.9 - 47.9 %   MCV 95.3 80.0 - 100.0 fL  MCH 28.5 26.0 - 34.0 pg   MCHC 29.9 (L) 30.0 - 36.0 g/dL   RDW 85.0 88.4 - 84.4 %   Platelets 404 (H) 150 - 400 K/uL   nRBC 0.0 0.0 - 0.2 %  Basic metabolic panel with GFR     Status: Abnormal   Collection Time: 02/20/24  9:05 AM  Result Value Ref Range   Sodium 138 135 - 145 mmol/L   Potassium 3.8 3.5 - 5.1 mmol/L   Chloride 99 98 - 111 mmol/L   CO2 31 22 - 32 mmol/L   Glucose, Bld 162 (H) 70 - 99 mg/dL   BUN 22 (H) 6 - 20 mg/dL   Creatinine, Ser 9.38 0.61 - 1.24 mg/dL   Calcium 9.2 8.9 - 89.6 mg/dL   GFR, Estimated >39 >39 mL/min   Anion gap 8 5 - 15  Glucose, capillary     Status: Abnormal   Collection Time: 02/20/24 10:09 AM  Result Value Ref Range   Glucose-Capillary 159 (H) 70 - 99 mg/dL   Comment 1 Notify RN    Comment 2 Document in Chart         LOS: 116 days   Owen DELENA Lore, MD [facility] 02/20/2024, 10:57 AM

## 2024-02-20 NOTE — Plan of Care (Signed)
 Pt still not making any meaningful movements or following commands. Moved to chair today. No change in mental status or overall medical health.  Problem: Intracerebral Hemorrhage Tissue Perfusion: Goal: Complications of Intracerebral Hemorrhage will be minimized Outcome: Progressing   Problem: Health Behavior/Discharge Planning: Goal: Goals will be collaboratively established with patient/family Outcome: Progressing   Problem: Nutrition: Goal: Risk of aspiration will decrease Outcome: Progressing

## 2024-02-20 NOTE — Progress Notes (Signed)
 Pharmacy Antibiotic Note  Garrett [name] is a 48 y.o. male with possible pneumonia on Unasyn .  Respiratory status worsening and he is noted with recent MRSA PNA> Pharmacy has been consulted for cefepime  and vancomycin  dosing.  Scr stable at 0.61. Vanc trough of 20.   Plan: - Reduce Vancomycin  to 1250mg  IV q12h -Will follow renal function, cultures and clinical progress   Height: 5' 8 (172.7 cm) Weight: 97 kg (213 lb 13.5 oz) IBW/kg (Calculated) : 68.4  Temp (24hrs), Avg:99.4 F (37.4 C), Min:98.5 F (36.9 C), Max:100.9 F (38.3 C)  Recent Labs  Lab 02/17/24 0724 02/17/24 2340 02/18/24 1335 02/19/24 1225 02/20/24 0905 02/20/24 2112  WBC 18.0* 13.3* 15.6* 15.2* 13.2*  --   CREATININE 0.59* 0.56* 0.58* 0.47* 0.61  --   VANCOTROUGH  --   --   --   --   --  20    Estimated Creatinine Clearance: 128.8 mL/min (by C-G formula based on SCr of 0.61 mg/dL).    Allergies  Allergen Reactions   Tomato Hives, Itching and Rash    Antimicrobials this admission: Cefepime  6/38 Vanc 6/28>> Unasyn  6/27>> 6/28   Vanc 6/1 >> 6/8 - 5/14 TA - MRSA (colonization) - 6/2 TA- GNRs, GPCs, GPRs>>MRSA (micro said they did not see the GNR or GPR on cx  Dose adjustments this admission:   Microbiology results: 5/14 TA - MRSA (colonization) 6/2 TA- GNRs, GPCs, GPRs>>MRSA (micro said they did not see the GNR or GPR on cx  6/27 trach, GNR, GPR, GPR >> Klebsiella oxytoca 6/28 MRSA PCR  Thank you for allowing pharmacy to be a part of this patient's care.  Rankin Sams, PharmD, BCPS, BCCCP Clinical Pharmacist

## 2024-02-20 NOTE — Progress Notes (Signed)
 Occupational Therapy Treatment Patient Details Name: Rhyse Loux MRN: [id number] DOB: [birth year] Today's Date: 02/20/2024   History of present illness Pt is a 48 yo male who was found down 10/27/23 with agonal breathing. 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 Intubated 3/8, cortrak placed 3/14, trach placed 3/22, PEG placed 3/24. De-cannulated 6/10. PMH: HTN, smoker   OT comments  Pt progressed from bed to chair. Pt with long rolled blanket along spine to help with scapula retraction and trunk extension for upright posture in chair. Pt with cervical positioning of neck with neck pillow and rolled towel on R side to help with alignment and neck extension. Pt 6L HFNC with 100% FIO2 noted to have some accessory muscle breathing during session but appears comfortable at this time in the chair. Recommendation for maximove +2 (A) for transfer to chair on geo mat pressure relief cushion for max 2 hours by staff. OT continues to follow 1x per week at a distanace.       If plan is discharge home, recommend the following:  Two people to help with walking and/or transfers;Two people to help with bathing/dressing/bathroom;Direct supervision/assist for medications management;Direct supervision/assist for financial management;Assist for transportation;Help with stairs or ramp for entrance   Equipment Recommendations  Wheelchair (measurements OT);Wheelchair cushion (measurements OT);Hospital bed;Hoyer lift    Recommendations for Other Services Speech consult    Precautions / Restrictions Precautions Precautions: Fall Recall of Precautions/Restrictions: Impaired Precaution/Restrictions Comments: PEG, abdominal binder, SBP < 160 Required Braces or Orthoses: Other Brace Splint/Cast: Bil resting hand splints Splint/Cast - Date  Prophylactic Dressing Applied (if applicable): 11/02/23 Restrictions Weight Bearing Restrictions Per Provider Order: No       Mobility Bed Mobility Overal bed mobility: Needs Assistance Bed Mobility: Rolling Rolling: +2 for physical assistance, +2 for safety/equipment, Total assist              Transfers Overall transfer level: Needs assistance   Transfers: Bed to chair/wheelchair/BSC             General transfer comment: total (A) to the bed Transfer via Lift Equipment: Maximove   Balance Overall balance assessment: Needs assistance   Sitting balance-Leahy Scale: Zero Sitting balance - Comments: requires (A) with pillows to help align neck and trunk                                   ADL either performed or assessed with clinical judgement   ADL Overall ADL's : Needs assistance/impaired                                       General ADL Comments: total (A)    Extremity/Trunk Assessment Upper Extremity Assessment Upper Extremity Assessment: Right hand dominant;RUE deficits/detail;LUE deficits/detail RUE Deficits / Details: edema noted at the hand no activation noted RUE Coordination: decreased fine motor;decreased gross motor LUE Deficits / Details: not following commands this session.pt with slight edema but does not follow orange ball with visual input LUE Coordination: decreased fine motor;decreased gross motor   Lower Extremity Assessment Lower Extremity Assessment: Defer to PT evaluation        Vision   Additional Comments: pt with eye bobbing this session and not visually  tracking the object.pt does not hold gaze. pt drifting up and down   Perception     Praxis     Communication Communication Communication: Impaired   Cognition Arousal: Alert Behavior During Therapy: Flat affect Cognition: Difficult to assess Difficult to assess due to: Impaired communication           OT - Cognition Comments: no response  during session so unable to determine                 Following commands: Impaired        Cueing      Exercises Other Exercises Other Exercises: PROM HAND wrist elbow bil UE x10 Other Exercises: retrograde massage R hand for edema present with lotion Other Exercises: neck pillow used for midline alignment of neck and extension    Shoulder Instructions       General Comments 100% FIO2 on 6L HFNC. pt appears to accessory breath.    Pertinent Vitals/ Pain       Pain Assessment Pain Assessment: Faces Faces Pain Scale: No hurt  Home Living                                          Prior Functioning/Environment              Frequency  Min 1X/week        Progress Toward Goals  OT Goals(current goals can now be found in the care plan section)  Progress towards OT goals: Not progressing toward goals - comment  Acute Rehab OT Goals Patient Stated Goal: none OT Goal Formulation: Patient unable to participate in goal setting Time For Goal Achievement: 03/05/24 Potential to Achieve Goals: Fair ADL Goals Pt/caregiver will Perform Home Exercise Program: Increased ROM;Both right and left upper extremity Additional ADL Goal #1: Pt will follow 1 step commands with 50% accuracy in a non distracting environment. Additional ADL Goal #2: Pt will complete bed mobility with max assist +2, maintain sitting balance statically for 3 minutes with mod assist. Additional ADL Goal #3: Pt will use ball squeeze in L hand for accurate yes answer on 5/5 attempts.  Plan      Co-evaluation    PT/OT/SLP Co-Evaluation/Treatment: Yes Reason for Co-Treatment: Complexity of the patient's impairments (multi-system involvement);For patient/therapist safety;Necessary to address cognition/behavior during functional activity;To address functional/ADL transfers   OT goals addressed during session: Proper use of Adaptive equipment and DME;ADL's and self-care SLP goals  addressed during session: Communication;Cognition    AM-PAC OT 6 Clicks Daily Activity     Outcome Measure   Help from another person eating meals?: Total Help from another person taking care of personal grooming?: Total Help from another person toileting, which includes using toliet, bedpan, or urinal?: Total Help from another person bathing (including washing, rinsing, drying)?: Total Help from another person to put on and taking off regular upper body clothing?: Total Help from another person to put on and taking off regular lower body clothing?: Total 6 Click Score: 6    End of Session Equipment Utilized During Treatment: Oxygen  OT Visit Diagnosis: Unsteadiness on feet (R26.81);Muscle weakness (generalized) (M62.81)   Activity Tolerance Patient tolerated treatment well   Patient Left in chair;with call bell/phone within reach   Nurse Communication Mobility status;Precautions;Need for lift equipment        Time: 1330-1425 OT Time Calculation (min): 55 min  Charges:  OT General Charges $OT Visit: 1 Visit OT Treatments $Self Care/Home Management : 8-22 mins $Therapeutic Activity: 8-22 mins   Brynn, OTR/L  Acute Rehabilitation Services Office: [phone] .   Ely Molt 02/20/2024, 3:19 PM

## 2024-02-20 NOTE — Progress Notes (Signed)
 Speech Language Pathology Treatment: Cognitive-Linguistic  Patient Details Name: Xiong Haidar MRN: [id number] DOB: 08/08/1976 Today's Date: 02/20/2024 Time: 8652-8589 SLP Time Calculation (min) (ACUTE ONLY): 23 min  Assessment / Plan / Recommendation Clinical Impression  Treatment session focused on facilitating command-following and utilizing any motor output system for communication, yes/no responses. Pt was being transferred to recliner per OT/PT.  Once sitting upright with head supported, he was engaged with vertical yes/no board but was unable to hold gaze on words with reliability.  Two objects were held within range of vision and pt was asked to discriminate between them, also without success despite max verbal/visual cues.  Requiring more cues/assist overall to elicit intentional responses.  Concerned that Khasir is less interactive and demonstrating less frequent and less reliable motor output that can be harnessed for even rudimentary yes/no interactions.  SLP will continue to follow a min of 1x/week.     HPI HPI: Pt is a 48 yo male who was found down 10/27/23 with agonal breathing. 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 Intubated 3/8, cortrak placed 3/14, trach placed 3/22, PEG placed 3/24; decannulated. PMH: HTN, smoker      SLP Plan  Continue with current plan of care          Recommendations   SLP min 1x/week.                                Continue with current plan of care   Emiyah Spraggins L. Vona, MA CCC/SLP Clinical Specialist - Acute Care SLP Acute Rehabilitation Services Office number [phone]   [name]  02/20/2024, 3:10 PM

## 2024-02-21 ENCOUNTER — Inpatient Hospital Stay (HOSPITAL_COMMUNITY)

## 2024-02-21 DIAGNOSIS — I613 Nontraumatic intracerebral hemorrhage in brain stem: Secondary | ICD-10-CM | POA: Diagnosis not present

## 2024-02-21 LAB — BASIC METABOLIC PANEL WITH GFR
Anion gap: 13 (ref 5–15)
BUN: 22 mg/dL — ABNORMAL HIGH (ref 6–20)
CO2: 27 mmol/L (ref 22–32)
Calcium: 9.3 mg/dL (ref 8.9–10.3)
Chloride: 98 mmol/L (ref 98–111)
Creatinine, Ser: 0.47 mg/dL — ABNORMAL LOW (ref 0.61–1.24)
GFR, Estimated: >60 mL/min (ref >60)
Glucose, Bld: 120 mg/dL — ABNORMAL HIGH (ref 70–99)
Potassium: 5.2 mmol/L — ABNORMAL HIGH (ref 3.5–5.1)
Sodium: 138 mmol/L (ref 135–145)

## 2024-02-21 LAB — GLUCOSE, CAPILLARY
Glucose-Capillary: 123 mg/dL — ABNORMAL HIGH (ref 70–99)
Glucose-Capillary: 187 mg/dL — ABNORMAL HIGH (ref 70–99)
Glucose-Capillary: 160 mg/dL — ABNORMAL HIGH (ref 70–99)
Glucose-Capillary: 87 mg/dL (ref 70–99)
Glucose-Capillary: 166 mg/dL — ABNORMAL HIGH (ref 70–99)

## 2024-02-21 LAB — CBC
HCT: 37.4 % — ABNORMAL LOW (ref 39.0–52.0)
Hemoglobin: 10.8 g/dL — ABNORMAL LOW (ref 13.0–17.0)
MCH: 28.5 pg (ref 26.0–34.0)
MCHC: 28.9 g/dL — ABNORMAL LOW (ref 30.0–36.0)
MCV: 98.7 fL (ref 80.0–100.0)
Platelets: 440 10*3/uL — ABNORMAL HIGH (ref 150–400)
RBC: 3.79 MIL/uL — ABNORMAL LOW (ref 4.22–5.81)
RDW: 14.7 % (ref 11.5–15.5)
WBC: 16 10*3/uL — ABNORMAL HIGH (ref 4.0–10.5)
nRBC: 0 % (ref 0.0–0.2)

## 2024-02-21 NOTE — Progress Notes (Addendum)
 [facility] PROGRESS NOTE    Progress Note  Garrett [name]  FMW:[id number] DOB: 1976-04-13 DOA: 10/27/2023 PCP: Pcp, No     Brief Narrative:   Garrett [name] is an 48 y.o. male past medical history of hypertension was found to to be in agonal breathing by fianc on 10/27/2023 unresponsive UDS was positive for opiates, he did respond to Narcan  became hypertensive CT was noted to have a 4.1 intracranial hemorrhage in the pons with intraventricular extension into the fourth ventricle and basal cistern admitted to the ICU extubated with tracheostomy placed on 11/08/2023, PEG tube placed on 11/08/2023.  He remains poorly responsive and has a poor prognosis at this time.  He has now been decannulated.  Significant Events: 3/8: Intubated in ED, pontine bleed/SAH. CT head Acute large hemorrhage 4.1 cm in pons with intraventricular extension without hydrocephalus. 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  Can follow commands with eyes. 3/16 Purposeful with left upper extremity  3/22 tracheostomy placed at bedside, bleeding issues overnight from trach site 3/24 PEG 3/24 Sputum culture with MRSA 4/20: trach was changed to cuffless #8 5/1: Mental status appears to be somewhat improving off narcotics, more awake alert following simple commands(able to grimace, move left toes, track vertically with right eye).  Speech following. Trach downsized to [employer] cuffless #6 XLT 5/4: Modafinil  held, low threshold to resume if mental status/ability to interact diminishes 5/10: PEG dislodged, foley bulb placed and IR consulted to replace tube 5/11: IR replaced PEG tube 5/13 - 5/14: + Fever.  Restarted antibiotics.  Urine culture + E. Coli, foley catheter changed. 5/17 Tracheostomy changed.   5/18: Completed antibiotics for UTI 5/23: Stable, no significant change, awaiting disability for placement. 6-11 Tracheostomy de cannulated 01/29/2024  after RT couldn't pass suction catheter.  6/27: placed on 3 L oxygen overnight, tachypnea, increased secretion. Started IV Unasyn . CCM consulted.  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 2D echo showed an EF of 60% A1c of 4.1 LDL 96. He was also treated with hypertonic saline. Follow-up CT on 11/17/2023 showed reduction in size and hemorrhage. He is currently PEG tube dependent. Patient had an extremely poor prognosis  Acute respiratory failure with hypoxia secondary to aspiration pneumonia in the setting of a large pontine stroke unable to protect his airway: He has completed his antibiotics. Decannulated 01/30/2024 after speech could not pass suction catheter. CCM is following. Per CCM if patient developed a respiratory distress and unable to maintain airway or clearing secretion will need to have a low threshold to reintubate. On 02/15/2024 patient developed respiratory distress. 02/16/2024 oxygen requirements were increased to 15 L culture grew staph and Klebsiella was transition to Vanco and cefepime . CCM is on board will need to complete 10 days of IV antibiotics. Tmax of 100.0. Will need of nursing facility.  MRSA and Klebsiella pneumonia: Continue IV vancomycin  and cefepime .  Complicated E. coli UTI: Needs exchange of Foley catheter on 03/01/2024. He completed his antibiotic regimen.  Essential hypertension: Continue Norvasc  losartan  and Lopressor . Hydralazine  will need twice daily.  Elevated LFTs: Improved.  Moderate protein caloric malnutrition: Chronic PEG tube continue nutrition.  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 Continue chronic lubrication  4  times a day.  Acute kidney injury: Resolved.  Mild hyperkalemia: Recheck basic metabolic panel today.  Macrocytic anemia/thrombocytosis: Now resolved.  Perineal skin injury: Patient on admission: Continue Gearhart Butt cream.  B12 and folate deficiency: Continue repletion.  Goals of care: Previous physician spoken to neurologist as he had no improvement since admission neurology considers him a very poor prognosis with no meaningful recovery. Family met with previous physician in person and they have decided that the patient is not safe to go home as he will require 24-hour care and they would like to pursue placement once found. They are not willing to consider de-escalation of care despite his extremely poor prognosis and little chance of meaningful recovery.  Disposition: Essentially awaiting on insurance/disability then plan is for SNF. Very futile care but family adamant    DVT prophylaxis: lovenox  Family Communication: Mother and fianc Status is: Inpatient Remains inpatient appropriate because: Intracranial hemorrhage    Code Status:     Code Status Orders  (From admission, onward)           Start     Ordered   11/01/23 1736  Do not attempt resuscitation (DNR) Pre-Arrest Interventions Desired  (Code Status)  Continuous       Question Answer Comment  If pulseless and not breathing No CPR or chest compressions.   In Pre-Arrest Conditions (Patient Has Pulse and Is Breathing) May intubate, use advanced airway interventions and cardioversion/ACLS medications if appropriate or indicated. May transfer to ICU.   Consent: Discussion documented in EHR or advanced directives reviewed      11/01/23 1735           Code Status History     Date Active Date Inactive Code Status Order ID Comments User Context   10/27/2023 2229 11/01/2023 1735 Full Code [id number]  [name], MD ED   10/27/2023 2205 10/27/2023 2229 Full Code [id number]  [name], MD ED          IV Access:   Peripheral IV   Procedures and diagnostic studies:   DG CHEST PORT 1 VIEW Result Date: 02/21/2024 CLINICAL DATA:  Unresponsive.  Fever. EXAM: PORTABLE CHEST 1 VIEW COMPARISON:  02/19/2024 FINDINGS: The patient's face obscures the right apex. The cardio pericardial silhouette is enlarged. Low volume film with basilar atelectasis and vascular congestion. No substantial pleural effusion. No acute bony abnormality. Telemetry leads overlie the chest. IMPRESSION: Low volume film with basilar atelectasis and vascular congestion. Interval slight improvement in aeration at the right base with otherwise no substantial change. Electronically Signed   By: Locus Candle M.D.   On: 02/21/2024 07:34     Medical Consultants:   None.   Subjective:    Binyamin Marquina nonverbal  Objective:    Vitals:   02/21/24 0415 02/21/24 0733 02/21/24 0734 02/21/24 0753  BP:  136/87 136/87   Pulse:  91 91 79  Resp:  (!) 24 (!) 24 17  Temp:      TempSrc:      SpO2:  100% 100% 96%  Weight: 98 kg     Height:       SpO2: 96 % O2 Flow Rate (L/min): (S) 2 L/min (Titrated to 2L, sats 100%.) FiO2 (%): 21 %   Intake/Output Summary (Last 24 hours) at 02/21/2024 0828 Last data filed at 02/21/2024 0400 Gross per 24 hour  Intake --  Output 2250 ml  Net -2250 ml   Filed Weights   02/19/24 0342 02/20/24 0413 02/21/24 0415  Weight: 97 kg 97 kg 98 kg    Exam: General exam: In no acute distress. Respiratory system: Good air movement and clear to auscultation. Cardiovascular system: S1 & S2 heard, RRR. No JVD. Gastrointestinal system: Abdomen is nondistended, soft and nontender.  Extremities: No pedal edema. Skin: No rashes, lesions or ulcers   Data Reviewed:    Labs: Basic Metabolic Panel: Recent Labs  Lab 02/17/24 2340 02/18/24 1335 02/19/24 1225 02/20/24 0905 02/21/24 0418  NA 143 141 139 138 138  K 4.3 4.3 4.1 3.8 5.2*  CL 101 100 100 99 98  CO2 31 32 30 31 27    GLUCOSE 181* 172* 162* 162* 120*  BUN 27* 22* 21* 22* 22*  CREATININE 0.56* 0.58* 0.47* 0.61 0.47*  CALCIUM 9.0 8.8* 9.0 9.2 9.3  MG 2.1  --   --   --   --   PHOS 4.0  --   --   --   --    GFR Estimated Creatinine Clearance: 129.5 mL/min (A) (by C-G formula based on SCr of 0.47 mg/dL (L)). Liver Function Tests: Recent Labs  Lab 02/17/24 2340  AST 57*  ALT 154*  ALKPHOS 73  BILITOT 0.5  PROT 6.9  ALBUMIN  2.6*   No results for input(s): LIPASE, AMYLASE in the last 168 hours. No results for input(s): AMMONIA in the last 168 hours. Coagulation profile No results for input(s): INR, PROTIME in the last 168 hours. COVID-19 Labs  No results for input(s): DDIMER, FERRITIN, LDH, CRP in the last 72 hours.  Lab Results  Component Value Date   SARSCOV2NAA NEGATIVE 01/20/2024    CBC: Recent Labs  Lab 02/17/24 2340 02/18/24 1335 02/19/24 1225 02/20/24 0905 02/21/24 0418  WBC 13.3* 15.6* 15.2* 13.2* 16.0*  NEUTROABS 10.1*  --   --   --   --   HGB 10.1* 9.8* 9.4* 9.8* 10.8*  HCT 35.2* 33.7* 32.2* 32.8* 37.4*  MCV 97.5 98.5 95.0 95.3 98.7  PLT 453* 448* 430* 404* 440*   Cardiac Enzymes: No results for input(s): CKTOTAL, CKMB, CKMBINDEX, TROPONINI in the last 168 hours. BNP (last 3 results) No results for input(s): PROBNP in the last 8760 hours. CBG: Recent Labs  Lab 02/20/24 1009 02/20/24 1434 02/20/24 1710 02/20/24 2207 02/21/24 0620  GLUCAP 159* 118* 114* 166* 123*   D-Dimer: No results for input(s): DDIMER in the last 72 hours. Hgb A1c: No results for input(s): HGBA1C in the last 72 hours. Lipid Profile: No results for input(s): CHOL, HDL, LDLCALC, TRIG, CHOLHDL, LDLDIRECT in the last 72 hours. Thyroid function studies: No results for input(s): TSH, T4TOTAL, T3FREE, THYROIDAB in the last 72 hours.  Invalid input(s): FREET3 Anemia work up: No results for input(s): VITAMINB12, FOLATE, FERRITIN,  TIBC, IRON, RETICCTPCT in the last 72 hours. Sepsis Labs: Recent Labs  Lab 02/18/24 1335 02/19/24 1225 02/20/24 0905 02/21/24 0418  WBC 15.6* 15.2* 13.2* 16.0*   Microbiology Recent Results (from the past 240 hours)  Expectorated Sputum Assessment w Gram Stain, Rflx to Resp Cult     Status: None   Collection Time: 02/15/24 10:03 AM   Specimen: Sputum  Result Value Ref Range Status   Specimen Description SPUTUM  Final   Special Requests NONE  Final   Sputum evaluation   Final    THIS SPECIMEN IS ACCEPTABLE FOR SPUTUM CULTURE Performed at [facility], [address] [place], [name] [address]    Report Status 02/15/2024 FINAL  Final  Culture, Respiratory w Gram  Stain     Status: None   Collection Time: 02/15/24 10:03 AM   Specimen: SPU  Result Value Ref Range Status   Specimen Description SPUTUM  Final   Special Requests NONE Reflexed from [id number]  Final   Gram Stain   Final    MODERATE WBC PRESENT, PREDOMINANTLY PMN ABUNDANT GRAM POSITIVE COCCI IN PAIRS IN CLUSTERS MODERATE GRAM NEGATIVE RODS RARE GRAM POSITIVE RODS Performed at [facility], [address], [place], [name] [address]    Culture   Final    ABUNDANT METHICILLIN RESISTANT STAPHYLOCOCCUS AUREUS RARE KLEBSIELLA OXYTOCA    Report Status 02/18/2024 FINAL  Final   Organism ID, Bacteria KLEBSIELLA OXYTOCA  Final      Susceptibility   Klebsiella oxytoca - MIC*    AMPICILLIN  RESISTANT Resistant     CEFEPIME  <=0.12 SENSITIVE Sensitive     CEFTAZIDIME <=1 SENSITIVE Sensitive     CEFTRIAXONE  <=0.25 SENSITIVE Sensitive     CIPROFLOXACIN  <=0.25 SENSITIVE Sensitive     GENTAMICIN <=1 SENSITIVE Sensitive     IMIPENEM 0.5 SENSITIVE Sensitive     TRIMETH/SULFA <=20 SENSITIVE Sensitive     AMPICILLIN /SULBACTAM 8 SENSITIVE Sensitive     PIP/TAZO <=4 SENSITIVE Sensitive ug/mL    * RARE KLEBSIELLA OXYTOCA  MRSA Next Gen by PCR, Nasal     Status: Abnormal   Collection Time: 02/16/24  1:49 PM    Specimen: Nasal Mucosa; Nasal Swab  Result Value Ref Range Status   MRSA by PCR Next Gen DETECTED (A) NOT DETECTED Final    Comment: RESULT CALLED TO, READ BACK BY AND VERIFIED WITH: RN RACHEL CRESSMAN [phone] [id number] BY J RAZZAK, MT (NOTE) The GeneXpert MRSA Assay (FDA approved for NASAL specimens only), is one component of a comprehensive MRSA colonization surveillance program. It is not intended to diagnose MRSA infection nor to guide or monitor treatment for MRSA infections. Test performance is not FDA approved in patients less than 39 years old. Performed at [facility], [address] [place], [name] [address]      Medications:    amLODipine   10 mg Per Tube Daily   artificial tears   Left Eye Q8H   Chlorhexidine  Gluconate Cloth  6 each Topical Daily   enoxaparin  (LOVENOX ) injection  40 mg Subcutaneous Daily   famotidine   20 mg Per Tube BID   feeding supplement (OSMOLITE 1.5 CAL)  237 mL Per Tube 5 X Daily   feeding supplement (PROSource TF20)  60 mL Per Tube TID   fiber supplement (BANATROL TF)  60 mL Per Tube BID   free water   150 mL Per Tube Q4H   hydrALAZINE   10 mg Per Tube BID   insulin  aspart  0-15 Units Subcutaneous 5 times per day   ipratropium-albuterol   3 mL Nebulization Q6H   losartan   100 mg Per Tube Daily   metoprolol  tartrate  100 mg Per Tube BID   multivitamin with minerals  1 tablet Per Tube Daily   mupirocin  ointment  1 Application Nasal BID   mouth rinse  15 mL Mouth Rinse 4 times per day   Continuous Infusions:  ceFEPime  (MAXIPIME ) IV 2 g (02/21/24 0545)   vancomycin         LOS: 117 days   Erle Odell Castor  [facility]  02/21/2024, 8:28 AM

## 2024-02-21 NOTE — Plan of Care (Signed)
  Problem: Intracerebral Hemorrhage Tissue Perfusion: Goal: Complications of Intracerebral Hemorrhage will be minimized Outcome: Progressing   Problem: Nutrition: Goal: Risk of aspiration will decrease Outcome: Progressing Goal: Dietary intake will improve Outcome: Progressing   Problem: Fluid Volume: Goal: Ability to maintain a balanced intake and output will improve Outcome: Progressing   Problem: Skin Integrity: Goal: Risk for impaired skin integrity will decrease Outcome: Progressing   Problem: Tissue Perfusion: Goal: Adequacy of tissue perfusion will improve Outcome: Progressing   Problem: Clinical Measurements: Goal: Ability to maintain clinical measurements within normal limits will improve Outcome: Progressing Goal: Will remain free from infection Outcome: Progressing Goal: Diagnostic test results will improve Outcome: Progressing Goal: Respiratory complications will improve Outcome: Progressing Goal: Cardiovascular complication will be avoided Outcome: Progressing   Problem: Activity: Goal: Risk for activity intolerance will decrease Outcome: Progressing   Problem: Nutrition: Goal: Adequate nutrition will be maintained Outcome: Progressing   Problem: Coping: Goal: Level of anxiety will decrease Outcome: Progressing   Problem: Elimination: Goal: Will not experience complications related to bowel motility Outcome: Progressing Goal: Will not experience complications related to urinary retention Outcome: Progressing   Problem: Pain Managment: Goal: General experience of comfort will improve and/or be controlled Outcome: Progressing   Problem: Safety: Goal: Ability to remain free from injury will improve Outcome: Progressing   Problem: Skin Integrity: Goal: Risk for impaired skin integrity will decrease Outcome: Progressing   Problem: Activity: Goal: Ability to tolerate increased activity will improve Outcome: Progressing   Problem:  Respiratory: Goal: Patent airway maintenance will improve Outcome: Progressing

## 2024-02-22 LAB — BASIC METABOLIC PANEL WITH GFR
Anion gap: 11 (ref 5–15)
BUN: 19 mg/dL (ref 6–20)
CO2: 33 mmol/L — ABNORMAL HIGH (ref 22–32)
Calcium: 9.4 mg/dL (ref 8.9–10.3)
Chloride: 99 mmol/L (ref 98–111)
Creatinine, Ser: 0.44 mg/dL — ABNORMAL LOW (ref 0.61–1.24)
GFR, Estimated: >60 mL/min (ref >60)
Glucose, Bld: 111 mg/dL — ABNORMAL HIGH (ref 70–99)
Potassium: 4.1 mmol/L (ref 3.5–5.1)
Sodium: 143 mmol/L (ref 135–145)

## 2024-02-22 LAB — GLUCOSE, CAPILLARY
Glucose-Capillary: 106 mg/dL — ABNORMAL HIGH (ref 70–99)
Glucose-Capillary: 148 mg/dL — ABNORMAL HIGH (ref 70–99)
Glucose-Capillary: 146 mg/dL — ABNORMAL HIGH (ref 70–99)
Glucose-Capillary: 155 mg/dL — ABNORMAL HIGH (ref 70–99)
Glucose-Capillary: 114 mg/dL — ABNORMAL HIGH (ref 70–99)

## 2024-02-23 DIAGNOSIS — I613 Nontraumatic intracerebral hemorrhage in brain stem: Secondary | ICD-10-CM | POA: Diagnosis not present

## 2024-02-23 LAB — BASIC METABOLIC PANEL WITH GFR
Anion gap: 9 (ref 5–15)
BUN: 22 mg/dL — ABNORMAL HIGH (ref 6–20)
CO2: 31 mmol/L (ref 22–32)
Calcium: 9.1 mg/dL (ref 8.9–10.3)
Chloride: 96 mmol/L — ABNORMAL LOW (ref 98–111)
Creatinine, Ser: 0.36 mg/dL — ABNORMAL LOW (ref 0.61–1.24)
GFR, Estimated: >60 mL/min (ref >60)
Glucose, Bld: 114 mg/dL — ABNORMAL HIGH (ref 70–99)
Potassium: 4.8 mmol/L (ref 3.5–5.1)
Sodium: 136 mmol/L (ref 135–145)

## 2024-02-23 LAB — GLUCOSE, CAPILLARY
Glucose-Capillary: 112 mg/dL — ABNORMAL HIGH (ref 70–99)
Glucose-Capillary: 159 mg/dL — ABNORMAL HIGH (ref 70–99)
Glucose-Capillary: 183 mg/dL — ABNORMAL HIGH (ref 70–99)
Glucose-Capillary: 132 mg/dL — ABNORMAL HIGH (ref 70–99)
Glucose-Capillary: 137 mg/dL — ABNORMAL HIGH (ref 70–99)

## 2024-02-23 NOTE — Plan of Care (Signed)
  Problem: Nutrition: Goal: Risk of aspiration will decrease Outcome: Progressing Goal: Dietary intake will improve Outcome: Progressing   Problem: Skin Integrity: Goal: Risk for impaired skin integrity will decrease Outcome: Progressing   Problem: Clinical Measurements: Goal: Ability to maintain clinical measurements within normal limits will improve Outcome: Progressing Goal: Will remain free from infection Outcome: Progressing Goal: Diagnostic test results will improve Outcome: Progressing Goal: Respiratory complications will improve Outcome: Progressing Goal: Cardiovascular complication will be avoided Outcome: Progressing   Problem: Elimination: Goal: Will not experience complications related to bowel motility Outcome: Progressing Goal: Will not experience complications related to urinary retention Outcome: Progressing   Problem: Safety: Goal: Ability to remain free from injury will improve Outcome: Progressing

## 2024-02-23 NOTE — Progress Notes (Signed)
 [facility] PROGRESS NOTE    Progress Note  Garrett [name]  FMW:[id number] DOB: Jan 12, 1976 DOA: 10/27/2023 PCP: Pcp, No     Brief Narrative:   Garrett [name] is an 48 y.o. male past medical history of hypertension was found to to be in agonal breathing by fianc on 10/27/2023 unresponsive UDS was positive for opiates, he did respond to Narcan  became hypertensive CT was noted to have a 4.1 intracranial hemorrhage in the pons with intraventricular extension into the fourth ventricle and basal cistern admitted to the ICU extubated with tracheostomy placed on 11/08/2023, PEG tube placed on 11/08/2023.  He remains poorly responsive and has a poor prognosis at this time.  He has now been decannulated.  Significant Events: 3/8: Intubated in ED, pontine bleed/SAH. CT head Acute large hemorrhage 4.1 cm in pons with intraventricular extension without hydrocephalus. 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  Can follow commands with eyes. 3/16 Purposeful with left upper extremity  3/22 tracheostomy placed at bedside, bleeding issues overnight from trach site 3/24 PEG 3/24 Sputum culture with MRSA 4/20: trach was changed to cuffless #8 5/1: Mental status appears to be somewhat improving off narcotics, more awake alert following simple commands(able to grimace, move left toes, track vertically with right eye).  Speech following. Trach downsized to [employer] cuffless #6 XLT 5/4: Modafinil  held, low threshold to resume if mental status/ability to interact diminishes 5/10: PEG dislodged, foley bulb placed and IR consulted to replace tube 5/11: IR replaced PEG tube 5/13 - 5/14: + Fever.  Restarted antibiotics.  Urine culture + E. Coli, foley catheter changed. 5/17 Tracheostomy changed.   5/18: Completed antibiotics for UTI 5/23: Stable, no significant change, awaiting disability for placement. 6-11 Tracheostomy de cannulated 01/29/2024  after RT couldn't pass suction catheter.  6/27: placed on 3 L oxygen overnight, tachypnea, increased secretion. Started IV Unasyn . CCM consulted.  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 2D echo showed an EF of 60% A1c of 4.1 LDL 96. He was also treated with hypertonic saline. Follow-up CT on 11/17/2023 showed reduction in size and hemorrhage. He is currently PEG tube dependent. Patient had an extremely poor prognosis  Acute respiratory failure with hypoxia secondary to aspiration pneumonia in the setting of a large pontine stroke unable to protect his airway: He has completed his antibiotics. Decannulated 01/30/2024 after speech could not pass suction catheter. CCM is following. Per CCM if patient developed a respiratory distress and unable to maintain airway or clearing secretion will need to have a low threshold to reintubate. On 02/15/2024 patient developed respiratory distress. 02/16/2024 oxygen requirements were increased to 15 L culture grew staph and Klebsiella was transition to Vanco and cefepime . CCM is on board will need to complete 10 days of IV antibiotics. Tmax of 100.0. Will need of nursing facility.  MRSA and Klebsiella pneumonia: Continue IV vancomycin  and cefepime .  Complicated E. coli UTI: Needs exchange of Foley catheter on 03/01/2024. He completed his antibiotic regimen.  Essential hypertension: Continue Norvasc  losartan  and Lopressor . Hydralazine  will need twice daily.  Elevated LFTs: Improved.  Moderate protein caloric malnutrition: Chronic PEG tube continue nutrition.  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 Continue chronic lubrication  4  times a day.  Acute kidney injury: Resolved.  Mild hyperkalemia: Recheck basic metabolic panel today.  Macrocytic anemia/thrombocytosis: Now resolved.  Perineal skin injury: Patient on admission: Continue Gearhart Butt cream.  B12 and folate deficiency: Continue repletion.  Goals of care: Previous physician spoken to neurologist as he had no improvement since admission neurology considers him a very poor prognosis with no meaningful recovery. Family met with previous physician in person and they have decided that the patient is not safe to go home as he will require 24-hour care and they would like to pursue placement once found. They are not willing to consider de-escalation of care despite his extremely poor prognosis and little chance of meaningful recovery.  Disposition: Essentially awaiting on insurance/disability then plan is for SNF. Very futile care but family adamant    DVT prophylaxis: lovenox  Family Communication: Mother and fianc Status is: Inpatient Remains inpatient appropriate because: Intracranial hemorrhage    Code Status:     Code Status Orders  (From admission, onward)           Start     Ordered   11/01/23 1736  Do not attempt resuscitation (DNR) Pre-Arrest Interventions Desired  (Code Status)  Continuous       Question Answer Comment  If pulseless and not breathing No CPR or chest compressions.   In Pre-Arrest Conditions (Patient Has Pulse and Is Breathing) May intubate, use advanced airway interventions and cardioversion/ACLS medications if appropriate or indicated. May transfer to ICU.   Consent: Discussion documented in EHR or advanced directives reviewed      11/01/23 1735           Code Status History     Date Active Date Inactive Code Status Order ID Comments User Context   10/27/2023 2229 11/01/2023 1735 Full Code [id number]  [name], MD ED   10/27/2023 2205 10/27/2023 2229 Full Code [id number]  [name], MD ED          IV Access:   Peripheral IV   Procedures and diagnostic studies:   No results found.    Medical Consultants:   None.   Subjective:    Garrett [name] nonverbal  Objective:    Vitals:   02/22/24 2013 02/22/24 2355 02/23/24 0336 02/23/24 0550  BP: (!) 147/98 129/80 (!) 142/94 136/83  Pulse: 87 75 69 74  Resp: (!) 23 20 (!) 25 (!) 22  Temp: 99.3 F (37.4 C) 98.5 F (36.9 C) 98.3 F (36.8 C) 98.2 F (36.8 C)  TempSrc: Oral Oral Oral Oral  SpO2: 100% 100% 100% 93%  Weight:      Height:       SpO2: 93 % O2 Flow Rate (L/min): 2 L/min FiO2 (%): 28 %   Intake/Output Summary (Last 24 hours) at 02/23/2024 0752 Last data filed at 02/22/2024 2023 Gross per 24 hour  Intake --  Output 1250 ml  Net -1250 ml   Filed Weights   02/19/24 0342 02/20/24 0413 02/21/24 0415  Weight: 97 kg 97 kg 98 kg    Exam: General exam: In no acute distress. Respiratory system: Good air movement and clear to auscultation. Cardiovascular system: S1 & S2 heard, RRR. No JVD. Gastrointestinal system: Abdomen is nondistended, soft and nontender.  Extremities: No pedal edema. Skin: No rashes, lesions or ulcers   Data Reviewed:    Labs: Basic Metabolic Panel: Recent Labs  Lab 02/17/24 2340 02/18/24 1335 02/19/24 1225 02/20/24 0905 02/21/24 0418 02/22/24 0955 02/23/24 0500  NA  143   < > 139 138 138 143 136  K 4.3   < > 4.1 3.8 5.2* 4.1 4.8  CL 101   < > 100 99 98 99 96*  CO2 31   < > 30 31 27  33* 31  GLUCOSE 181*   < > 162* 162* 120* 111* 114*  BUN 27*   < > 21* 22* 22* 19 22*  CREATININE 0.56*   < > 0.47* 0.61 0.47* 0.44* 0.36*  CALCIUM 9.0   < > 9.0 9.2 9.3 9.4 9.1  MG 2.1  --   --   --   --   --   --   PHOS 4.0  --   --   --   --   --   --    < > = values in this interval not displayed.   GFR Estimated Creatinine Clearance: 129.5 mL/min (A) (by C-G formula based on SCr of 0.36 mg/dL (L)). Liver Function Tests: Recent Labs  Lab 02/17/24 2340  AST 57*  ALT  154*  ALKPHOS 73  BILITOT 0.5  PROT 6.9  ALBUMIN  2.6*   No results for input(s): LIPASE, AMYLASE in the last 168 hours. No results for input(s): AMMONIA in the last 168 hours. Coagulation profile No results for input(s): INR, PROTIME in the last 168 hours. COVID-19 Labs  No results for input(s): DDIMER, FERRITIN, LDH, CRP in the last 72 hours.  Lab Results  Component Value Date   SARSCOV2NAA NEGATIVE 01/20/2024    CBC: Recent Labs  Lab 02/17/24 2340 02/18/24 1335 02/19/24 1225 02/20/24 0905 02/21/24 0418  WBC 13.3* 15.6* 15.2* 13.2* 16.0*  NEUTROABS 10.1*  --   --   --   --   HGB 10.1* 9.8* 9.4* 9.8* 10.8*  HCT 35.2* 33.7* 32.2* 32.8* 37.4*  MCV 97.5 98.5 95.0 95.3 98.7  PLT 453* 448* 430* 404* 440*   Cardiac Enzymes: No results for input(s): CKTOTAL, CKMB, CKMBINDEX, TROPONINI in the last 168 hours. BNP (last 3 results) No results for input(s): PROBNP in the last 8760 hours. CBG: Recent Labs  Lab 02/22/24 1056 02/22/24 1434 02/22/24 1839 02/22/24 2236 02/23/24 0532  GLUCAP 148* 146* 155* 114* 112*   D-Dimer: No results for input(s): DDIMER in the last 72 hours. Hgb A1c: No results for input(s): HGBA1C in the last 72 hours. Lipid Profile: No results for input(s): CHOL, HDL, LDLCALC, TRIG, CHOLHDL, LDLDIRECT in the last 72 hours. Thyroid function studies: No results for input(s): TSH, T4TOTAL, T3FREE, THYROIDAB in the last 72 hours.  Invalid input(s): FREET3 Anemia work up: No results for input(s): VITAMINB12, FOLATE, FERRITIN, TIBC, IRON, RETICCTPCT in the last 72 hours. Sepsis Labs: Recent Labs  Lab 02/18/24 1335 02/19/24 1225 02/20/24 0905 02/21/24 0418  WBC 15.6* 15.2* 13.2* 16.0*   Microbiology Recent Results (from the past 240 hours)  Expectorated Sputum Assessment w Gram Stain, Rflx to Resp Cult     Status: None   Collection Time: 02/15/24 10:03 AM   Specimen: Sputum   Result Value Ref Range Status   Specimen Description SPUTUM  Final   Special Requests NONE  Final   Sputum evaluation   Final    THIS SPECIMEN IS ACCEPTABLE FOR SPUTUM CULTURE Performed at [facility], [address] [place], [name] [address]    Report Status 02/15/2024 FINAL  Final  Culture, Respiratory w Gram Stain     Status: None   Collection Time: 02/15/24 10:03 AM   Specimen: SPU  Result Value Ref Range Status   Specimen Description SPUTUM  Final   Special Requests NONE Reflexed from [id number]  Final   Gram Stain   Final    MODERATE WBC PRESENT, PREDOMINANTLY PMN ABUNDANT GRAM POSITIVE COCCI IN PAIRS IN CLUSTERS MODERATE GRAM NEGATIVE RODS RARE GRAM POSITIVE RODS Performed at [facility], [address], [place], [name] [address]    Culture   Final    ABUNDANT METHICILLIN RESISTANT STAPHYLOCOCCUS AUREUS RARE KLEBSIELLA OXYTOCA    Report Status 02/18/2024 FINAL  Final   Organism ID, Bacteria KLEBSIELLA OXYTOCA  Final      Susceptibility   Klebsiella oxytoca - MIC*    AMPICILLIN  RESISTANT Resistant     CEFEPIME  <=0.12 SENSITIVE Sensitive     CEFTAZIDIME <=1 SENSITIVE Sensitive     CEFTRIAXONE  <=0.25 SENSITIVE Sensitive     CIPROFLOXACIN  <=0.25 SENSITIVE Sensitive     GENTAMICIN <=1 SENSITIVE Sensitive     IMIPENEM 0.5 SENSITIVE Sensitive     TRIMETH/SULFA <=20 SENSITIVE Sensitive     AMPICILLIN /SULBACTAM 8 SENSITIVE Sensitive     PIP/TAZO <=4 SENSITIVE Sensitive ug/mL    * RARE KLEBSIELLA OXYTOCA  MRSA Next Gen by PCR, Nasal     Status: Abnormal   Collection Time: 02/16/24  1:49 PM   Specimen: Nasal Mucosa; Nasal Swab  Result Value Ref Range Status   MRSA by PCR Next Gen DETECTED (A) NOT DETECTED Final    Comment: RESULT CALLED TO, READ BACK BY AND VERIFIED WITH: RN RACHEL CRESSMAN [phone] [id number] BY J RAZZAK, MT (NOTE) The GeneXpert MRSA Assay (FDA approved for NASAL specimens only), is one component of a comprehensive MRSA colonization  surveillance program. It is not intended to diagnose MRSA infection nor to guide or monitor treatment for MRSA infections. Test performance is not FDA approved in patients less than 45 years old. Performed at [facility], [address] [place], [place] [address]      Medications:    amLODipine   10 mg Per Tube Daily   artificial tears   Left Eye Q8H   Chlorhexidine  Gluconate Cloth  6 each Topical Daily   enoxaparin  (LOVENOX ) injection  40 mg Subcutaneous Daily   famotidine   20 mg Per Tube BID   feeding supplement (OSMOLITE 1.5 CAL)  237 mL Per Tube 5 X Daily   feeding supplement (PROSource TF20)  60 mL Per Tube TID   fiber supplement (BANATROL TF)  60 mL Per Tube BID   free water   150 mL Per Tube Q4H   hydrALAZINE   10 mg Per Tube BID   insulin  aspart  0-15 Units Subcutaneous 5 times per day   ipratropium-albuterol   3 mL Nebulization Q6H   losartan   100 mg Per Tube Daily   metoprolol  tartrate  100 mg Per Tube BID   multivitamin with minerals  1 tablet Per Tube Daily   mupirocin  ointment  1 Application Nasal BID   mouth rinse  15 mL Mouth Rinse 4 times per day   Continuous Infusions:  ceFEPime  (MAXIPIME ) IV 2 g (02/23/24 0622)   vancomycin  1,250 mg (02/22/24 2349)      LOS: 119 days   Erle Odell Castor  [facility]  02/23/2024, 7:52 AM

## 2024-02-23 NOTE — Plan of Care (Signed)
  Problem: Intracerebral Hemorrhage Tissue Perfusion: Goal: Complications of Intracerebral Hemorrhage will be minimized 02/23/2024 0603 by Gaetana Randall Mathew GORMAN, RN Outcome: Progressing 02/23/2024 0501 by Gaetana Randall Mathew GORMAN, RN Outcome: Progressing   Problem: Fluid Volume: Goal: Ability to maintain a balanced intake and output will improve 02/23/2024 0603 by Gaetana Randall Mathew GORMAN, RN Outcome: Progressing 02/23/2024 0501 by Gaetana Randall Mathew GORMAN, RN Outcome: Progressing   Problem: Tissue Perfusion: Goal: Adequacy of tissue perfusion will improve 02/23/2024 0603 by Gaetana Randall Mathew GORMAN, RN Outcome: Progressing 02/23/2024 0501 by Gaetana Randall Mathew GORMAN, RN Outcome: Progressing   Problem: Respiratory: Goal: Patent airway maintenance will improve 02/23/2024 0603 by Gaetana Randall Mathew GORMAN, RN Outcome: Progressing 02/23/2024 0501 by Gaetana Randall Mathew GORMAN, RN Outcome: Progressing

## 2024-02-24 ENCOUNTER — Inpatient Hospital Stay (HOSPITAL_COMMUNITY)

## 2024-02-24 DIAGNOSIS — J9611 Chronic respiratory failure with hypoxia: Secondary | ICD-10-CM

## 2024-02-24 DIAGNOSIS — Z930 Tracheostomy status: Secondary | ICD-10-CM | POA: Diagnosis not present

## 2024-02-24 DIAGNOSIS — Z515 Encounter for palliative care: Secondary | ICD-10-CM | POA: Diagnosis not present

## 2024-02-24 DIAGNOSIS — Z7189 Other specified counseling: Secondary | ICD-10-CM | POA: Diagnosis not present

## 2024-02-24 DIAGNOSIS — J9601 Acute respiratory failure with hypoxia: Secondary | ICD-10-CM | POA: Diagnosis not present

## 2024-02-24 DIAGNOSIS — I613 Nontraumatic intracerebral hemorrhage in brain stem: Secondary | ICD-10-CM | POA: Diagnosis not present

## 2024-02-24 DIAGNOSIS — Z789 Other specified health status: Secondary | ICD-10-CM | POA: Diagnosis not present

## 2024-02-24 LAB — CBC WITH DIFFERENTIAL/PLATELET
Abs Immature Granulocytes: 0.12 K/uL — ABNORMAL HIGH (ref 0.00–0.07)
Basophils Absolute: 0.1 K/uL (ref 0.0–0.1)
Basophils Relative: 1 %
Eosinophils Absolute: 0.1 K/uL (ref 0.0–0.5)
Eosinophils Relative: 1 %
HCT: 34.6 % — ABNORMAL LOW (ref 39.0–52.0)
Hemoglobin: 10.7 g/dL — ABNORMAL LOW (ref 13.0–17.0)
Immature Granulocytes: 1 %
Lymphocytes Relative: 12 %
Lymphs Abs: 2 K/uL (ref 0.7–4.0)
MCH: 29.5 pg (ref 26.0–34.0)
MCHC: 30.9 g/dL (ref 30.0–36.0)
MCV: 95.3 fL (ref 80.0–100.0)
Monocytes Absolute: 1 K/uL (ref 0.1–1.0)
Monocytes Relative: 6 %
Neutro Abs: 12.6 K/uL — ABNORMAL HIGH (ref 1.7–7.7)
Neutrophils Relative %: 79 %
Platelets: 519 K/uL — ABNORMAL HIGH (ref 150–400)
RBC: 3.63 MIL/uL — ABNORMAL LOW (ref 4.22–5.81)
RDW: 15.3 % (ref 11.5–15.5)
WBC: 15.9 K/uL — ABNORMAL HIGH (ref 4.0–10.5)
nRBC: 0.1 % (ref 0.0–0.2)

## 2024-02-24 LAB — COMPREHENSIVE METABOLIC PANEL WITH GFR
ALT: 83 U/L — ABNORMAL HIGH (ref 0–44)
AST: 36 U/L (ref 15–41)
Albumin: 2.7 g/dL — ABNORMAL LOW (ref 3.5–5.0)
Alkaline Phosphatase: 57 U/L (ref 38–126)
Anion gap: 9 (ref 5–15)
BUN: 19 mg/dL (ref 6–20)
CO2: 31 mmol/L (ref 22–32)
Calcium: 9.1 mg/dL (ref 8.9–10.3)
Chloride: 95 mmol/L — ABNORMAL LOW (ref 98–111)
Creatinine, Ser: 0.32 mg/dL — ABNORMAL LOW (ref 0.61–1.24)
GFR, Estimated: >60 mL/min (ref >60)
Glucose, Bld: 180 mg/dL — ABNORMAL HIGH (ref 70–99)
Potassium: 4.7 mmol/L (ref 3.5–5.1)
Sodium: 135 mmol/L (ref 135–145)
Total Bilirubin: 0.3 mg/dL (ref 0.0–1.2)
Total Protein: 7.2 g/dL (ref 6.5–8.1)

## 2024-02-24 LAB — GLUCOSE, CAPILLARY
Glucose-Capillary: 138 mg/dL — ABNORMAL HIGH (ref 70–99)
Glucose-Capillary: 187 mg/dL — ABNORMAL HIGH (ref 70–99)
Glucose-Capillary: 108 mg/dL — ABNORMAL HIGH (ref 70–99)
Glucose-Capillary: 148 mg/dL — ABNORMAL HIGH (ref 70–99)
Glucose-Capillary: 142 mg/dL — ABNORMAL HIGH (ref 70–99)

## 2024-02-24 MED ORDER — IPRATROPIUM-ALBUTEROL 0.5-2.5 (3) MG/3ML IN SOLN
3.0000 mL | Freq: Two times a day (BID) | RESPIRATORY_TRACT | Status: DC
  Filled 2024-02-24: qty 3

## 2024-02-24 MED ORDER — IPRATROPIUM-ALBUTEROL 0.5-2.5 (3) MG/3ML IN SOLN
3.0000 mL | RESPIRATORY_TRACT | Status: DC | PRN
  Administered 2024-02-25 – 2024-04-09 (×2): 3 mL via RESPIRATORY_TRACT
  Filled 2024-02-24 (×5): qty 3

## 2024-02-24 NOTE — Progress Notes (Signed)
 NAME:  Garrett [name], MRN:  [id number], DOB:  [birth year], LOS: 120 ADMISSION DATE:  10/27/2023, CONSULTATION DATE:  10/27/23 REFERRING MD:  Dr Madelyne, CHIEF COMPLAINT:  Found down   History of Present Illness:  48 y/o male with past medical history of hypertension who was found down for unknown downtime by his fiance when she came home from work. Reportedly, having agonal breathing.  He apparently had driven her to work this morning.  He has HTN but never checks his BP and does not follow with MD.  She says you can tell when his BP is really high; his eyes get blood shot and he is sweating.  No h/o seizures.  Besides almost daily [name] and cigarette smoking, she is not aware of any other drugs. Drug screen positive for opioids and he was given several doses of Narcan . Patient found to have:  1. 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 Basal cisterns and fourth ventricle are effaced without hydrocephalus at this time. 2. Trace additional subarachnoid hemorrhage along the left parietal convexity. BP in ED 262/156. His CXR revealed a RUL and perihilar infiltrates suggesting aspiration pneumonia. ED labs also revealed PH 7.169, LA 4.4, CPK 985. Patient was intubated in ED.  Pertinent  Medical History  Hypertension  Significant Hospital Events: Including procedures, antibiotic start and stop dates in addition to other pertinent events   3/8: Intubated in ED, pontine bleed/SAH. CT head 17:09 Acute large hemorrhage 4.1 cm in pons with intraventricular extension without hydrocephalus 3/9: CTA 03:14 AM No change in IPH in pons, similar biparietal [facility] 3/14: Now awake, appears locked in can follow commands with eyes. 3/16 Purposeful with left upper extremity  3/22 tracheostomy placed at bedside, bleeding issues overnight from trach site 3/24 PEG (Dr. Sebastian) 3/24 sputum  culture with MRSA 3/27 vomited. TF on hold. Abd film c/w ileus. Added reglan , got SSE. Did tolerate PSV all day  3/28 BMs x2 after SSE day prior. Added back TFs at 1/2 rated. Tolerated PS 3/29 Tolerated PS  3/31 Tolerating CPAP PS 15/5, TF on hold due to ileus. 4/1: con't to hold tube feeds today, restarting vancomycin  and sending tracheal aspirate as peaks are 37, plat 24, driving 19. Thick secretions. Fever overnight.  4/2: peaks improved. Ongoing hiccups. Trickle feeding. Neuro exam unchanged.  4/20: trach was changed to cuffless #8 4/28: not safe to swallow. Limited communication and he follows simple commands. On TF 5/1: on TC 28%, with large secretions. Working with speech and he is improving to initiate PMV trials under ST supervision   5/5: on TC 30%, 7 L/min. Trach #6 cuffless, changed 5/1 to facilitate PMV trials  5/12: on TC 21%, 6 L/min. Trach #6 cuffless, unable to produce sounds or speak on PMV. No significant resp secretions. On PEG TF 01/29/2024 tracheostomy was removed due to failure to well provide adequate airway 02/15/2024 pulmonary critical care consult.  Interim History / Subjective:   Called by hospitalist that patient was having desaturations Per nursing, after NT suctioning he started to have desaturations He had #4 trach in place on 6/11 which was decanulated as suction catheter was not able to be passed. Not able to upsize back to #6 xlt due to narrowed trach site.  Objective    Blood pressure (!) 137/92, pulse 66, temperature 98 F (36.7 C), resp. rate 15, height 5' 8 (1.727 m), weight 98 kg, SpO2 92%.  Intake/Output Summary (Last 24 hours) at 02/24/2024 1332 Last data filed at 02/24/2024 1038 Gross per 24 hour  Intake 2200.29 ml  Output 1600 ml  Net 600.29 ml   Filed Weights   02/19/24 0342 02/20/24 0413 02/21/24 0415  Weight: 97 kg 97 kg 98 kg    Examination: Chronically ill Bandage over old trach site Regular rate and rhythm Course breath  sounds Not following commands  Resolved problem list   Assessment and Plan   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 Acute hypoxic respiratory failure s/p tracheostomy  - Decannulated 6/11; was okay until around 6/27, secretion burden heavier, some combination MRSA/klebsiella HCAP + fluid overload combined with inability to oxygenate  Continue bronchodilators Vanc/cefepime  x 10 days, end date 7/9 Keep euvolemic, watch I/O Will need tracheostomy re-placed for secretion management. Will determine whether ENT or our team to perform procedure.  Dorn Chill, MD [facility] Office: [phone]   See Amion for personal pager PCCM on call pager [phone] until 7pm. Please call Elink 7p-7a. [phone]

## 2024-02-24 NOTE — Progress Notes (Signed)
 Palliative Medicine Progress Note   Patient Name: Garrett [name]       Date: 02/24/2024 DOB: 21-Feb-1976  Age: 48 y.o. MRN#: [id number] Attending Physician: Odell Celinda Balo, MD Primary Care Physician: Freddrick Johns Admit Date: 10/27/2023   HPI/Patient Profile: 48 year old male with past medical history of hypertension who presented to the ED on 10/27/2023 after he was found down at home.  He was found to have an acute large intracranial hemorrhage centered in the pons with intraventricular extension into the fourth ventricle and basal cisterns.  He was initially intubated.  After extensive discussion, family decided to proceed with tracheostomy, initially placed on 11/08/23 and replaced on 01/05/24.  PEG tube placed 11/08/2023.   Patient remains poorly responsive. [name] was de-cannulated 01/29/24 due to difficulty with passing suction catheter.   LOS - 119 days   PMT was initially consulted 10/30/2023 for discussion about goals of care with regard to tracheostomy versus one-way extubation. Signed off 11/18/23. PMT re-consulted on 02/06/24 to revisit GOC with family, in the setting of minimal clinical improvement.   Subjective: Chart reviewed. Patient assessed. Remains poorly responsive; does not track or follow commands.  I spoke with mom/[name] by phone. We reviewed patient's hospital course and current plan of care to pursue facility placement. Also discussed code status, as I had noted that patient was documented as DNR with pre-arrest interventions (still in place from GOC discussions when patient was on the ventilator).   Objective:  Physical Exam Vitals reviewed.  Constitutional:      General: He is not in acute distress.    Appearance: He is ill-appearing.  Pulmonary:     Effort: Pulmonary  effort is normal.  Neurological:     Motor: Weakness present.     Comments: Does not follow commands             Vital Signs: BP (!) 137/92 (BP Location: Right Arm)   Pulse 66   Temp 98 F (36.7 C)   Resp 15   Ht 5' 8 (1.727 m)   Wt 98 kg   SpO2 92%   BMI 32.85 kg/m  SpO2: SpO2: 92 % O2 Device: O2 Device: Nasal Cannula O2 Flow Rate: O2 Flow Rate (L/min): 2 L/min    Palliative Medicine Assessment & Plan   Assessment: Principal Problem:   ICH (intracerebral  hemorrhage) (HCC) Active Problems:   Advanced care planning/counseling discussion   Pressure injury of skin   Goals of care, counseling/discussion   Poor prognosis    Recommendations/Plan: Continue current supportive interventions Spoke with mother today to clarify code status regarding  PMT will continue to follow  I plan to follow-up with mom when I return to service on Wednesday 7/9  Primary Decision Maker: Next of kin - mother/[name]  Code Status: DNR with pre-arrest interventions  Prognosis:  Unable to determine  Discharge Planning: To Be Determined  Care plan was discussed with ***  Thank you for allowing the Palliative Medicine Team to assist in the care of this patient.   ***   Recardo KATHEE Loll, NP   Please contact Palliative Medicine Team phone at [phone] for questions and concerns.  For individual providers, please see AMION.

## 2024-02-24 NOTE — Progress Notes (Signed)
 [facility] PROGRESS NOTE    Progress Note  Garrett [name]  FMW:[id number] DOB: 05/24/1976 DOA: 10/27/2023 PCP: Pcp, No     Brief Narrative:   Garrett [name] is an 48 y.o. male past medical history of hypertension was found to to be in agonal breathing by fianc on 10/27/2023 unresponsive UDS was positive for opiates, he did respond to Narcan  became hypertensive CT was noted to have a 4.1 intracranial hemorrhage in the pons with intraventricular extension into the fourth ventricle and basal cistern admitted to the ICU extubated with tracheostomy placed on 11/08/2023, PEG tube placed on 11/08/2023.  He remains poorly responsive and has a poor prognosis at this time.  He has now been decannulated.  Significant Events: 3/8: Intubated in ED, pontine bleed/SAH. CT head Acute large hemorrhage 4.1 cm in pons with intraventricular extension without hydrocephalus. 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  Can follow commands with eyes. 3/16 Purposeful with left upper extremity  3/22 tracheostomy placed at bedside, bleeding issues overnight from trach site 3/24 PEG 3/24 Sputum culture with MRSA 4/20: trach was changed to cuffless #8 5/1: Mental status appears to be somewhat improving off narcotics, more awake alert following simple commands(able to grimace, move left toes, track vertically with right eye).  Speech following. Trach downsized to [employer] cuffless #6 XLT 5/4: Modafinil  held, low threshold to resume if mental status/ability to interact diminishes 5/10: PEG dislodged, foley bulb placed and IR consulted to replace tube 5/11: IR replaced PEG tube 5/13 - 5/14: + Fever.  Restarted antibiotics.  Urine culture + E. Coli, foley catheter changed. 5/17 Tracheostomy changed.   5/18: Completed antibiotics for UTI 5/23: Stable, no significant change, awaiting disability for placement. 6-11 Tracheostomy de cannulated 01/29/2024  after RT couldn't pass suction catheter.  6/27: placed on 3 L oxygen overnight, tachypnea, increased secretion. Started IV Unasyn . CCM consulted.  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 2D echo showed an EF of 60% A1c of 4.1 LDL 96. He was also treated with hypertonic saline. Follow-up CT on 11/17/2023 showed reduction in size and hemorrhage. He is currently PEG tube dependent. Patient had an extremely poor prognosis  Acute respiratory failure with hypoxia secondary to aspiration pneumonia in the setting of a large pontine stroke unable to protect his airway: He has completed his antibiotics. Decannulated 01/30/2024 after speech could not pass suction catheter. CCM is following. Per CCM if patient developed a respiratory distress and unable to maintain airway or clearing secretion will need to have a low threshold to reintubate. On 02/15/2024 patient developed respiratory distress. 02/16/2024 oxygen requirements were increased to 15 L culture grew staph and Klebsiella was transition to Vanco and cefepime . CCM is on board will need to complete 10 days of IV antibiotics. Tmax of 100.0. Will need of nursing facility.  MRSA and Klebsiella pneumonia: Continue IV vancomycin  and cefepime .  Complicated E. coli UTI: Needs exchange of Foley catheter on 03/01/2024. He completed his antibiotic regimen.  Essential hypertension: Continue Norvasc  losartan  and Lopressor . Hydralazine  will need twice daily.  Elevated LFTs: Improved.  Moderate protein caloric malnutrition: Chronic PEG tube continue nutrition.  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 Continue chronic lubrication  4  times a day.  Acute kidney injury: Resolved.  Mild hyperkalemia: Recheck basic metabolic panel today.  Macrocytic anemia/thrombocytosis: Now resolved.  Perineal skin injury: Patient on admission: Continue Gearhart Butt cream.  B12 and folate deficiency: Continue repletion.  Goals of care: Previous physician spoken to neurologist as he had no improvement since admission neurology considers him a very poor prognosis with no meaningful recovery. Family met with previous physician in person and they have decided that the patient is not safe to go home as he will require 24-hour care and they would like to pursue placement once found. They are not willing to consider de-escalation of care despite his extremely poor prognosis and little chance of meaningful recovery.  Disposition: Essentially awaiting on insurance/disability then plan is for SNF. Very futile care but family adamant    DVT prophylaxis: lovenox  Family Communication: Mother and fianc Status is: Inpatient Remains inpatient appropriate because: Intracranial hemorrhage    Code Status:     Code Status Orders  (From admission, onward)           Start     Ordered   11/01/23 1736  Do not attempt resuscitation (DNR) Pre-Arrest Interventions Desired  (Code Status)  Continuous       Question Answer Comment  If pulseless and not breathing No CPR or chest compressions.   In Pre-Arrest Conditions (Patient Has Pulse and Is Breathing) May intubate, use advanced airway interventions and cardioversion/ACLS medications if appropriate or indicated. May transfer to ICU.   Consent: Discussion documented in EHR or advanced directives reviewed      11/01/23 1735           Code Status History     Date Active Date Inactive Code Status Order ID Comments User Context   10/27/2023 2229 11/01/2023 1735 Full Code [id number]  [name], MD ED   10/27/2023 2205 10/27/2023 2229 Full Code [id number]  [name], MD ED          IV Access:   Peripheral IV   Procedures and diagnostic studies:   No results found.    Medical Consultants:   None.   Subjective:    [name] nonverbal  Objective:    Vitals:   02/23/24 2001 02/23/24 2125 02/23/24 2341 02/24/24 0322  BP: 122/74  114/73 138/89  Pulse: 83  67 66  Resp:   18 19  Temp: 98.3 F (36.8 C)  97.9 F (36.6 C) 98 F (36.7 C)  TempSrc: Oral  Oral Oral  SpO2: 100% 98% 96% 92%  Weight:      Height:       SpO2: 92 % O2 Flow Rate (L/min): 1 L/min FiO2 (%): 28 %   Intake/Output Summary (Last 24 hours) at 02/24/2024 0706 Last data filed at 02/24/2024 0616 Gross per 24 hour  Intake 2827.29 ml  Output 1500 ml  Net 1327.29 ml   Filed Weights   02/19/24 0342 02/20/24 0413 02/21/24 0415  Weight: 97 kg 97 kg 98 kg    Exam: General exam: In no acute distress. Respiratory system: Good air movement and clear to auscultation. Cardiovascular system: S1 & S2 heard, RRR. No JVD. Gastrointestinal system: Abdomen is nondistended, soft and nontender.  Extremities: No pedal edema. Skin: No rashes, lesions or ulcers   Data Reviewed:    Labs: Basic Metabolic Panel: Recent Labs  Lab 02/17/24 2340 02/18/24 1335 02/19/24 1225 02/20/24 0905 02/21/24 0418 02/22/24 0955 02/23/24 0500  NA 143   < > 139 138  138 143 136  K 4.3   < > 4.1 3.8 5.2* 4.1 4.8  CL 101   < > 100 99 98 99 96*  CO2 31   < > 30 31 27  33* 31  GLUCOSE 181*   < > 162* 162* 120* 111* 114*  BUN 27*   < > 21* 22* 22* 19 22*  CREATININE 0.56*   < > 0.47* 0.61 0.47* 0.44* 0.36*  CALCIUM 9.0   < > 9.0 9.2 9.3 9.4 9.1  MG 2.1  --   --   --   --   --   --   PHOS 4.0  --   --   --   --   --   --    < > = values in this interval not displayed.   GFR Estimated Creatinine Clearance: 129.5 mL/min (A) (by C-G formula based on SCr of 0.36 mg/dL (L)). Liver Function Tests: Recent Labs  Lab 02/17/24 2340  AST 57*  ALT 154*  ALKPHOS 73  BILITOT 0.5  PROT 6.9   ALBUMIN  2.6*   No results for input(s): LIPASE, AMYLASE in the last 168 hours. No results for input(s): AMMONIA in the last 168 hours. Coagulation profile No results for input(s): INR, PROTIME in the last 168 hours. COVID-19 Labs  No results for input(s): DDIMER, FERRITIN, LDH, CRP in the last 72 hours.  Lab Results  Component Value Date   SARSCOV2NAA NEGATIVE 01/20/2024    CBC: Recent Labs  Lab 02/17/24 2340 02/18/24 1335 02/19/24 1225 02/20/24 0905 02/21/24 0418  WBC 13.3* 15.6* 15.2* 13.2* 16.0*  NEUTROABS 10.1*  --   --   --   --   HGB 10.1* 9.8* 9.4* 9.8* 10.8*  HCT 35.2* 33.7* 32.2* 32.8* 37.4*  MCV 97.5 98.5 95.0 95.3 98.7  PLT 453* 448* 430* 404* 440*   Cardiac Enzymes: No results for input(s): CKTOTAL, CKMB, CKMBINDEX, TROPONINI in the last 168 hours. BNP (last 3 results) No results for input(s): PROBNP in the last 8760 hours. CBG: Recent Labs  Lab 02/23/24 0952 02/23/24 1421 02/23/24 1821 02/23/24 2155 02/24/24 0613  GLUCAP 159* 183* 132* 137* 138*   D-Dimer: No results for input(s): DDIMER in the last 72 hours. Hgb A1c: No results for input(s): HGBA1C in the last 72 hours. Lipid Profile: No results for input(s): CHOL, HDL, LDLCALC, TRIG, CHOLHDL, LDLDIRECT in the last 72 hours. Thyroid function studies: No results for input(s): TSH, T4TOTAL, T3FREE, THYROIDAB in the last 72 hours.  Invalid input(s): FREET3 Anemia work up: No results for input(s): VITAMINB12, FOLATE, FERRITIN, TIBC, IRON, RETICCTPCT in the last 72 hours. Sepsis Labs: Recent Labs  Lab 02/18/24 1335 02/19/24 1225 02/20/24 0905 02/21/24 0418  WBC 15.6* 15.2* 13.2* 16.0*   Microbiology Recent Results (from the past 240 hours)  Expectorated Sputum Assessment w Gram Stain, Rflx to Resp Cult     Status: None   Collection Time: 02/15/24 10:03 AM   Specimen: Sputum  Result Value Ref Range Status   Specimen  Description SPUTUM  Final   Special Requests NONE  Final   Sputum evaluation   Final    THIS SPECIMEN IS ACCEPTABLE FOR SPUTUM CULTURE Performed at [facility], [address] [place], [name] [address]    Report Status 02/15/2024 FINAL  Final  Culture, Respiratory w Gram Stain     Status: None   Collection Time: 02/15/24 10:03 AM   Specimen: SPU  Result Value Ref Range Status  Specimen Description SPUTUM  Final   Special Requests NONE Reflexed from [id number]  Final   Gram Stain   Final    MODERATE WBC PRESENT, PREDOMINANTLY PMN ABUNDANT GRAM POSITIVE COCCI IN PAIRS IN CLUSTERS MODERATE GRAM NEGATIVE RODS RARE GRAM POSITIVE RODS Performed at [facility], [address] [place], [name] [address]    Culture   Final    ABUNDANT METHICILLIN RESISTANT STAPHYLOCOCCUS AUREUS RARE KLEBSIELLA OXYTOCA    Report Status 02/18/2024 FINAL  Final   Organism ID, Bacteria KLEBSIELLA OXYTOCA  Final      Susceptibility   Klebsiella oxytoca - MIC*    AMPICILLIN  RESISTANT Resistant     CEFEPIME  <=0.12 SENSITIVE Sensitive     CEFTAZIDIME <=1 SENSITIVE Sensitive     CEFTRIAXONE  <=0.25 SENSITIVE Sensitive     CIPROFLOXACIN  <=0.25 SENSITIVE Sensitive     GENTAMICIN <=1 SENSITIVE Sensitive     IMIPENEM 0.5 SENSITIVE Sensitive     TRIMETH/SULFA <=20 SENSITIVE Sensitive     AMPICILLIN /SULBACTAM 8 SENSITIVE Sensitive     PIP/TAZO <=4 SENSITIVE Sensitive ug/mL    * RARE KLEBSIELLA OXYTOCA  MRSA Next Gen by PCR, Nasal     Status: Abnormal   Collection Time: 02/16/24  1:49 PM   Specimen: Nasal Mucosa; Nasal Swab  Result Value Ref Range Status   MRSA by PCR Next Gen DETECTED (A) NOT DETECTED Final    Comment: RESULT CALLED TO, READ BACK BY AND VERIFIED WITH: RN RACHEL CRESSMAN [phone] [id number] BY J RAZZAK, MT (NOTE) The GeneXpert MRSA Assay (FDA approved for NASAL specimens only), is one component of a comprehensive MRSA colonization surveillance program. It is not intended to diagnose  MRSA infection nor to guide or monitor treatment for MRSA infections. Test performance is not FDA approved in patients less than 60 years old. Performed at [facility], [address] [place], [name] [address]      Medications:    amLODipine   10 mg Per Tube Daily   artificial tears   Left Eye Q8H   Chlorhexidine  Gluconate Cloth  6 each Topical Daily   enoxaparin  (LOVENOX ) injection  40 mg Subcutaneous Daily   famotidine   20 mg Per Tube BID   feeding supplement (OSMOLITE 1.5 CAL)  237 mL Per Tube 5 X Daily   feeding supplement (PROSource TF20)  60 mL Per Tube TID   fiber supplement (BANATROL TF)  60 mL Per Tube BID   free water   150 mL Per Tube Q4H   hydrALAZINE   10 mg Per Tube BID   insulin  aspart  0-15 Units Subcutaneous 5 times per day   ipratropium-albuterol   3 mL Nebulization Q6H   losartan   100 mg Per Tube Daily   metoprolol  tartrate  100 mg Per Tube BID   multivitamin with minerals  1 tablet Per Tube Daily   mouth rinse  15 mL Mouth Rinse 4 times per day   Continuous Infusions:  ceFEPime  (MAXIPIME ) IV Stopped (02/24/24 0616)   vancomycin  Stopped (02/24/24 0030)      LOS: 120 days   Erle Odell Castor  [facility]  02/24/2024, 7:06 AM

## 2024-02-24 NOTE — Progress Notes (Signed)
   02/24/24 2125  Respiratory Severity Assessment  $ Protocol Assessment  Yes  Heart Rate 0  Breath Sounds 2  Respiratory Pattern 1  Cough 0  Chest X Ray 1  O2 Requirements/ Pulse Ox Sat(%) 0  Mental Status 0  Dyspnea 0  Score Total 4  Aerosolized Bronchodilators  Aerosolized bronchodilator indications Aerosolized bronchodilator indicated  Medication Plan of Care Hand held neb treatment  Bronchial Hygiene  Bronchial Hygiene Plan of Care Cough & Deep breath  Respiratory Therapy Follow Up Assessment  Assessment follow up date 02/24/24   Pt scoring 4 on RT protocol, nebs will be changed to PRN

## 2024-02-24 NOTE — Plan of Care (Addendum)
 Patient still not following commands, is mute, no change in mental status, was weaned off oxygen but d/to occasional tachypnea, desaturation to 90-92% and congestion, RT called for NT suctioning, and kept him back to oxygen via [place] @2lt , Spo2 >97% thereafter, will continue to monitor. Problem: Nutrition: Goal: Dietary intake will improve Outcome: Progressing   Problem: Fluid Volume: Goal: Ability to maintain a balanced intake and output will improve Outcome: Progressing   Problem: Skin Integrity: Goal: Risk for impaired skin integrity will decrease Outcome: Progressing   Problem: Clinical Measurements: Goal: Respiratory complications will improve Outcome: Progressing   Problem: Elimination: Goal: Will not experience complications related to bowel motility Outcome: Progressing Goal: Will not experience complications related to urinary retention Outcome: Progressing   Problem: Skin Integrity: Goal: Risk for impaired skin integrity will decrease Outcome: Progressing

## 2024-02-24 NOTE — Progress Notes (Signed)
 Pharmacy Antibiotic Note  Garrett [name] is a 48 y.o. male  with possible pneumonia an previously on Unasyn . Respiratory status worsening on 02/16/24 and noted with recent MRSA PNA. Pharmacy was consulted for Cefepime  and Vancomycin  dosing.  Day #8 Cefepime  and Vanc.  Prior plan for 7 days, but course extended due to low grade temps. Afebrile today.   Vanc regimen was adjusted on 7/2 with trough level at upper range.  Plan: Continue Cefepime  2gm IV q8hrs Continue Vancomycin  1250 mg IV q12h. Monitor renal function, clinical status.  Labs due in am. Follow up for antibiotic stop time, anticipating 02/26/24. No further Vanc levels unless significant change in renal function or course extended.  Height: 5' 8 (172.7 cm) Weight: 98 kg (216 lb 0.8 oz) IBW/kg (Calculated) : 68.4  Temp (24hrs), Avg:98 F (36.7 C), Min:97.7 F (36.5 C), Max:98.3 F (36.8 C)  Recent Labs  Lab 02/17/24 2340 02/18/24 1335 02/19/24 1225 02/20/24 0905 02/20/24 2112 02/21/24 0418 02/22/24 0955 02/23/24 0500  WBC 13.3* 15.6* 15.2* 13.2*  --  16.0*  --   --   CREATININE 0.56* 0.58* 0.47* 0.61  --  0.47* 0.44* 0.36*  VANCOTROUGH  --   --   --   --  20  --   --   --     Estimated Creatinine Clearance: 129.5 mL/min (A) (by C-G formula based on SCr of 0.36 mg/dL (L)).    Allergies  Allergen Reactions   Tomato Hives, Itching and Rash    Antimicrobials this admission: Cefepime  6/28 >> Vancomycin  6/1 >> 6/8, 6/28>> Unasyn  6/27>> 6/28 Bactroban  nasal 7/1>>7/5  Dose adjustments this admission: 7/2 Vanc trough level 20 mcg/ml on 1500 mg IV q12h > decreased to 1250 mg IV q12h  Microbiology results: 5/13 blood: negative 5/13 urine: >100K/ml E coli - pansensitive 5/14 trach aspirate: few MRSA (colonization) 6/01 blood: negative 6/01 respiratory PCR: negative 6/01 COVID, flu and RSV: negative 6/02 trach aspirate: few GNRs, few GPCs, rare GPRs on gram stain >> moderate MRSA (micro said they did not see  the GNR or GPR on cx)  6/27 sputum: abundant Staph aureus, rare Klebsiella oxytoca 6/28 MRSA PCR: positive  Thank you for allowing pharmacy to be a part of this patient's care.  Genaro Zebedee Calin, RPh 02/24/2024 12:01 PM

## 2024-02-24 NOTE — Plan of Care (Signed)
  Problem: Clinical Measurements: Goal: Ability to maintain clinical measurements within normal limits will improve Outcome: Progressing Goal: Will remain free from infection Outcome: Progressing Goal: Diagnostic test results will improve Outcome: Progressing Goal: Respiratory complications will improve Outcome: Progressing Goal: Cardiovascular complication will be avoided Outcome: Progressing   Problem: Skin Integrity: Goal: Risk for impaired skin integrity will decrease Outcome: Progressing   Problem: Nutrition: Goal: Adequate nutrition will be maintained Outcome: Progressing   Problem: Elimination: Goal: Will not experience complications related to bowel motility Outcome: Progressing Goal: Will not experience complications related to urinary retention Outcome: Progressing   Problem: Pain Managment: Goal: General experience of comfort will improve and/or be controlled Outcome: Progressing   Problem: Safety: Goal: Ability to remain free from injury will improve Outcome: Progressing

## 2024-02-25 ENCOUNTER — Other Ambulatory Visit: Payer: Self-pay

## 2024-02-25 ENCOUNTER — Inpatient Hospital Stay (HOSPITAL_COMMUNITY)

## 2024-02-25 DIAGNOSIS — D509 Iron deficiency anemia, unspecified: Secondary | ICD-10-CM | POA: Diagnosis not present

## 2024-02-25 DIAGNOSIS — I613 Nontraumatic intracerebral hemorrhage in brain stem: Secondary | ICD-10-CM | POA: Diagnosis not present

## 2024-02-25 DIAGNOSIS — I10 Essential (primary) hypertension: Secondary | ICD-10-CM | POA: Diagnosis not present

## 2024-02-25 DIAGNOSIS — J150 Pneumonia due to Klebsiella pneumoniae: Secondary | ICD-10-CM

## 2024-02-25 DIAGNOSIS — I952 Hypotension due to drugs: Secondary | ICD-10-CM

## 2024-02-25 DIAGNOSIS — J9601 Acute respiratory failure with hypoxia: Secondary | ICD-10-CM | POA: Diagnosis not present

## 2024-02-25 DIAGNOSIS — J690 Pneumonitis due to inhalation of food and vomit: Secondary | ICD-10-CM | POA: Diagnosis not present

## 2024-02-25 DIAGNOSIS — E440 Moderate protein-calorie malnutrition: Secondary | ICD-10-CM | POA: Diagnosis not present

## 2024-02-25 DIAGNOSIS — J9600 Acute respiratory failure, unspecified whether with hypoxia or hypercapnia: Secondary | ICD-10-CM

## 2024-02-25 DIAGNOSIS — R1310 Dysphagia, unspecified: Secondary | ICD-10-CM | POA: Diagnosis not present

## 2024-02-25 LAB — GLUCOSE, CAPILLARY
Glucose-Capillary: 157 mg/dL — ABNORMAL HIGH (ref 70–99)
Glucose-Capillary: 159 mg/dL — ABNORMAL HIGH (ref 70–99)
Glucose-Capillary: 173 mg/dL — ABNORMAL HIGH (ref 70–99)
Glucose-Capillary: 197 mg/dL — ABNORMAL HIGH (ref 70–99)
Glucose-Capillary: 201 mg/dL — ABNORMAL HIGH (ref 70–99)
Glucose-Capillary: 55 mg/dL — ABNORMAL LOW (ref 70–99)

## 2024-02-25 LAB — COMPREHENSIVE METABOLIC PANEL WITH GFR
ALT: 91 U/L — ABNORMAL HIGH (ref 0–44)
AST: 39 U/L (ref 15–41)
Albumin: 2.6 g/dL — ABNORMAL LOW (ref 3.5–5.0)
Alkaline Phosphatase: 56 U/L (ref 38–126)
Anion gap: 11 (ref 5–15)
BUN: 20 mg/dL (ref 6–20)
CO2: 32 mmol/L (ref 22–32)
Calcium: 9.2 mg/dL (ref 8.9–10.3)
Chloride: 95 mmol/L — ABNORMAL LOW (ref 98–111)
Creatinine, Ser: 0.43 mg/dL — ABNORMAL LOW (ref 0.61–1.24)
GFR, Estimated: >60 mL/min (ref >60)
Glucose, Bld: 136 mg/dL — ABNORMAL HIGH (ref 70–99)
Potassium: 5.1 mmol/L (ref 3.5–5.1)
Sodium: 138 mmol/L (ref 135–145)
Total Bilirubin: 0.2 mg/dL (ref 0.0–1.2)
Total Protein: 7 g/dL (ref 6.5–8.1)

## 2024-02-25 LAB — BLOOD GAS, ARTERIAL
O2 Saturation: 92.4 %
Patient temperature: 37.8
pH, Arterial: 7.07 — CL (ref 7.35–7.45)
pO2, Arterial: 69 mmHg — ABNORMAL LOW (ref 83–108)

## 2024-02-25 LAB — CBC WITH DIFFERENTIAL/PLATELET
Abs Immature Granulocytes: 0.33 K/uL — ABNORMAL HIGH (ref 0.00–0.07)
Basophils Absolute: 0.1 K/uL (ref 0.0–0.1)
Basophils Relative: 0 %
Eosinophils Absolute: 0.1 K/uL (ref 0.0–0.5)
Eosinophils Relative: 1 %
HCT: 34.5 % — ABNORMAL LOW (ref 39.0–52.0)
Hemoglobin: 9.8 g/dL — ABNORMAL LOW (ref 13.0–17.0)
Immature Granulocytes: 2 %
Lymphocytes Relative: 11 %
Lymphs Abs: 1.9 K/uL (ref 0.7–4.0)
MCH: 28.6 pg (ref 26.0–34.0)
MCHC: 28.4 g/dL — ABNORMAL LOW (ref 30.0–36.0)
MCV: 100.6 fL — ABNORMAL HIGH (ref 80.0–100.0)
Monocytes Absolute: 1.7 K/uL — ABNORMAL HIGH (ref 0.1–1.0)
Monocytes Relative: 10 %
Neutro Abs: 12.6 K/uL — ABNORMAL HIGH (ref 1.7–7.7)
Neutrophils Relative %: 76 %
Platelets: 574 K/uL — ABNORMAL HIGH (ref 150–400)
RBC: 3.43 MIL/uL — ABNORMAL LOW (ref 4.22–5.81)
RDW: 15.5 % (ref 11.5–15.5)
WBC: 16.7 K/uL — ABNORMAL HIGH (ref 4.0–10.5)
nRBC: 0.5 % — ABNORMAL HIGH (ref 0.0–0.2)

## 2024-02-25 LAB — MAGNESIUM
Magnesium: 2.3 mg/dL (ref 1.7–2.4)
Magnesium: 2.5 mg/dL — ABNORMAL HIGH (ref 1.7–2.4)

## 2024-02-25 LAB — POCT I-STAT 7, (LYTES, BLD GAS, ICA,H+H)
Acid-Base Excess: 11 mmol/L — ABNORMAL HIGH (ref 0.0–2.0)
Bicarbonate: 36.3 mmol/L — ABNORMAL HIGH (ref 20.0–28.0)
Calcium, Ion: 1.19 mmol/L (ref 1.15–1.40)
HCT: 26 % — ABNORMAL LOW (ref 39.0–52.0)
Hemoglobin: 8.8 g/dL — ABNORMAL LOW (ref 13.0–17.0)
O2 Saturation: 100 %
Patient temperature: 100
Potassium: 4.9 mmol/L (ref 3.5–5.1)
Sodium: 136 mmol/L (ref 135–145)
TCO2: 38 mmol/L — ABNORMAL HIGH (ref 22–32)
pCO2 arterial: 55.7 mmHg — ABNORMAL HIGH (ref 32–48)
pH, Arterial: 7.425 (ref 7.35–7.45)
pO2, Arterial: 373 mmHg — ABNORMAL HIGH (ref 83–108)

## 2024-02-25 LAB — BASIC METABOLIC PANEL WITH GFR
Anion gap: 7 (ref 5–15)
BUN: 26 mg/dL — ABNORMAL HIGH (ref 6–20)
CO2: 33 mmol/L — ABNORMAL HIGH (ref 22–32)
Calcium: 8.7 mg/dL — ABNORMAL LOW (ref 8.9–10.3)
Chloride: 96 mmol/L — ABNORMAL LOW (ref 98–111)
Creatinine, Ser: 0.58 mg/dL — ABNORMAL LOW (ref 0.61–1.24)
GFR, Estimated: >60 mL/min (ref >60)
Glucose, Bld: 246 mg/dL — ABNORMAL HIGH (ref 70–99)
Potassium: 4.5 mmol/L (ref 3.5–5.1)
Sodium: 136 mmol/L (ref 135–145)

## 2024-02-25 LAB — PHOSPHORUS: Phosphorus: 5.5 mg/dL — ABNORMAL HIGH (ref 2.5–4.6)

## 2024-02-25 MED ORDER — SODIUM CHLORIDE 0.9% FLUSH
10.0000 mL | INTRAVENOUS | Status: DC | PRN
  Administered 2024-03-29 – 2024-04-15 (×3): 10 mL

## 2024-02-25 MED ORDER — LACTATED RINGERS IV BOLUS
1000.0000 mL | Freq: Once | INTRAVENOUS | Status: AC
  Administered 2024-02-25: 1000 mL via INTRAVENOUS

## 2024-02-25 MED ORDER — FENTANYL CITRATE PF 50 MCG/ML IJ SOSY
50.0000 ug | PREFILLED_SYRINGE | INTRAMUSCULAR | Status: DC | PRN
  Filled 2024-02-25: qty 1

## 2024-02-25 MED ORDER — SODIUM CHLORIDE 0.9 % IV SOLN
250.0000 mL | INTRAVENOUS | Status: AC
  Administered 2024-02-25: 250 mL via INTRAVENOUS

## 2024-02-25 MED ORDER — FENTANYL CITRATE PF 50 MCG/ML IJ SOSY
50.0000 ug | PREFILLED_SYRINGE | INTRAMUSCULAR | Status: DC | PRN
  Administered 2024-02-25 – 2024-03-09 (×2): 50 ug via INTRAVENOUS
  Filled 2024-02-25: qty 1

## 2024-02-25 MED ORDER — SODIUM BICARBONATE 8.4 % IV SOLN
INTRAVENOUS | Status: AC
  Administered 2024-02-25: 50 meq via INTRAVENOUS
  Filled 2024-02-25: qty 50

## 2024-02-25 MED ORDER — ORAL CARE MOUTH RINSE
15.0000 mL | OROMUCOSAL | Status: DC | PRN
Start: 2024-02-25 — End: 2024-04-06

## 2024-02-25 MED ORDER — FAMOTIDINE IN NACL 20-0.9 MG/50ML-% IV SOLN
20.0000 mg | Freq: Two times a day (BID) | INTRAVENOUS | Status: DC
  Administered 2024-02-25: 20 mg via INTRAVENOUS
  Filled 2024-02-25: qty 50

## 2024-02-25 MED ORDER — DEXTROSE 50 % IV SOLN: 12.5000 g | INTRAVENOUS | Status: AC

## 2024-02-25 MED ORDER — SODIUM BICARBONATE 8.4 % IV SOLN: 50.0000 meq | Freq: Once | INTRAVENOUS | Status: AC

## 2024-02-25 MED ORDER — POLYETHYLENE GLYCOL 3350 17 G PO PACK
17.0000 g | PACK | Freq: Every day | ORAL | Status: DC
  Administered 2024-02-25 – 2024-02-28 (×3): 17 g
  Filled 2024-02-25 (×3): qty 1

## 2024-02-25 MED ORDER — ETOMIDATE 2 MG/ML IV SOLN
20.0000 mg | Freq: Once | INTRAVENOUS | Status: AC
  Administered 2024-02-25: 20 mg via INTRAVENOUS

## 2024-02-25 MED ORDER — DEXTROSE 50 % IV SOLN
INTRAVENOUS | Status: AC
  Administered 2024-02-25: 12.5 g via INTRAVENOUS
  Filled 2024-02-25: qty 50

## 2024-02-25 MED ORDER — ORAL CARE MOUTH RINSE
15.0000 mL | OROMUCOSAL | Status: DC
  Administered 2024-02-25 – 2024-04-06 (×509): 15 mL via OROMUCOSAL

## 2024-02-25 MED ORDER — FAMOTIDINE 20 MG PO TABS: 20.0000 mg | ORAL_TABLET | Freq: Two times a day (BID) | ORAL | Status: DC

## 2024-02-25 MED ORDER — INSULIN ASPART 100 UNIT/ML IJ SOLN
0.0000 [IU] | INTRAMUSCULAR | Status: DC
  Administered 2024-02-25 (×2): 3 [IU] via SUBCUTANEOUS
  Administered 2024-02-26 – 2024-02-27 (×6): 2 [IU] via SUBCUTANEOUS
  Administered 2024-02-27: 3 [IU] via SUBCUTANEOUS
  Administered 2024-02-28 – 2024-03-01 (×13): 2 [IU] via SUBCUTANEOUS

## 2024-02-25 MED ORDER — FENTANYL CITRATE PF 50 MCG/ML IJ SOSY
50.0000 ug | PREFILLED_SYRINGE | Freq: Once | INTRAMUSCULAR | Status: AC
  Administered 2024-02-25: 50 ug via INTRAVENOUS

## 2024-02-25 MED ORDER — NOREPINEPHRINE 4 MG/250ML-% IV SOLN
INTRAVENOUS | Status: AC
  Administered 2024-02-25: 2 ug/min via INTRAVENOUS
  Filled 2024-02-25: qty 250

## 2024-02-25 MED ORDER — DOCUSATE SODIUM 50 MG/5ML PO LIQD
100.0000 mg | Freq: Two times a day (BID) | ORAL | Status: DC
Start: 2024-02-25 — End: 2024-04-12
  Administered 2024-02-25 – 2024-04-12 (×67): 100 mg
  Filled 2024-02-25 (×70): qty 10

## 2024-02-25 MED ORDER — NOREPINEPHRINE 4 MG/250ML-% IV SOLN: 0.0000 ug/min | INTRAVENOUS | Status: DC

## 2024-02-25 MED ORDER — SODIUM CHLORIDE 0.9% FLUSH
10.0000 mL | Freq: Two times a day (BID) | INTRAVENOUS | Status: DC
  Administered 2024-02-25: 20 mL
  Administered 2024-02-26 (×2): 10 mL
  Administered 2024-02-27: 20 mL
  Administered 2024-02-27: 10 mL
  Administered 2024-02-28: 20 mL
  Administered 2024-02-28: 10 mL
  Administered 2024-02-29 (×2): 20 mL
  Administered 2024-03-01: 10 mL
  Administered 2024-03-01: 20 mL
  Administered 2024-03-02 – 2024-03-07 (×9): 10 mL
  Administered 2024-03-08: 20 mL
  Administered 2024-03-08: 10 mL
  Administered 2024-03-09: 20 mL
  Administered 2024-03-09 – 2024-03-16 (×14): 10 mL
  Administered 2024-03-16: 20 mL
  Administered 2024-03-17: 10 mL
  Administered 2024-03-17 – 2024-03-18 (×2): 20 mL
  Administered 2024-03-18 – 2024-03-19 (×2): 10 mL
  Administered 2024-03-19: 20 mL
  Administered 2024-03-20: 10 mL
  Administered 2024-03-20: 20 mL
  Administered 2024-03-21 (×2): 10 mL
  Administered 2024-03-22: 40 mL
  Administered 2024-03-22: 10 mL
  Administered 2024-03-23: 20 mL
  Administered 2024-03-23: 40 mL
  Administered 2024-03-24 – 2024-03-30 (×14): 10 mL
  Administered 2024-03-31: 20 mL
  Administered 2024-03-31 (×2): 10 mL
  Administered 2024-03-31: 20 mL
  Administered 2024-04-01 – 2024-05-07 (×64): 10 mL

## 2024-02-25 NOTE — Progress Notes (Addendum)
 1300 took over care of patient on arrival SP02 in 44s on 3 L patient sounds congested and wet PRN treatment given notified MD called RT sp02 not improving on [place] 15L [place] 1415 patient transferred to ICU for intubation

## 2024-02-25 NOTE — Progress Notes (Signed)
 RN spoke with Garrett [name], [name] in person and made him aware that some of the leads on ECG are showing t-wave elevation. PA gave order to obtain EKG.

## 2024-02-25 NOTE — Progress Notes (Signed)
 Single lumen PICC placement charted in error. Only a DL was placed.

## 2024-02-25 NOTE — Progress Notes (Signed)
 eLink Physician-Brief Progress Note Patient Name: Garrett [name] DOB: 21-Dec-1975 MRN: [id number]   Date of Service  02/25/2024  HPI/Events of Note  NPO orders but need clear orders as nothing via peg, reports of prob asp earlier from PEG,     asking for pepcid  to be changed to IV form of PPI, address free water  that's scheduled   if nothing via peg, will probably need cont fluids  Sodium 136, cr normal.   eICU Interventions  - NPO ordered.  - Pepcid  IV ordered - no need for free water  for now.   Hold meds via PEG. Discussed with RN.      Intervention Category Intermediate Interventions: Other:  Jodelle ONEIDA Hutching 02/25/2024, 9:31 PM

## 2024-02-25 NOTE — Significant Event (Signed)
 Rapid Response Event Note   Reason for Call :  Desaturation, called by RT  Initial Focused Assessment:  Ill appearing, unresponsive male lying in bed. Does not follow commands, does not respond to painful stimuli. No cough/gag noted. Lung sounds rhonchus. NTS and orally suctioned, thick tan secretions. Skin warm to touch. Patient with large bowel movement at time of assessment. Foley noted to have dark urine.   129/83 (97) HR 83 RR 22 O2 low 80s-low 90s% NRB 15L  Temp 99.1   Interventions/Plan of Care:  ABG 7.07, CO2 unable to calc CXR ordered NTS: thick, tan, pink-tinged secretions CCM to bedside: ICU tx, plan to intubate Family updated per CCM, code status changed from DNR-interventions to full code  Pt transferred to 4N25 on NRB 15L with sats mid 80s-low 90s, intubated upon arrival. 3W to pack up many belongings and send to 4N.  Event Summary:  MD Notified: A. Jone MD, EMERSON Cave MD, JD Emilio PA Call Time: [phone] Arrival Time: 1337 End Time: 1430  Tonna Chiquita POUR, RN

## 2024-02-25 NOTE — Progress Notes (Signed)
 Emilio, PA at bedside and aware that patient's blood pressure remains low and below goal on max Levo of 10 mcg/min. PA gave order that RN may go up to 20 mcg/min until fluid bolus goes in. PA gave order to run LR bolus in at 999 ml/H.

## 2024-02-25 NOTE — Progress Notes (Addendum)
 [facility] PROGRESS NOTE    Progress Note  Garrett [name]  FMW:[id number] DOB: 1975-12-01 DOA: 10/27/2023 PCP: Pcp, No     Brief Narrative:   Garrett [name] is an 48 y.o. male past medical history of hypertension was found to to be in agonal breathing by fianc on 10/27/2023 unresponsive UDS was positive for opiates, he did respond to Narcan  became hypertensive CT was noted to have a 4.1 intracranial hemorrhage in the pons with intraventricular extension into the fourth ventricle and basal cistern admitted to the ICU extubated with tracheostomy placed on 11/08/2023, PEG tube placed on 11/08/2023.  He remains poorly responsive and has a poor prognosis at this time.  He has now been decannulated.  Significant Events: 3/8: Intubated in ED, pontine bleed/SAH. CT head Acute large hemorrhage 4.1 cm in pons with intraventricular extension without hydrocephalus. 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  Can follow commands with eyes. 3/16 Purposeful with left upper extremity  3/22 tracheostomy placed at bedside, bleeding issues overnight from trach site 3/24 PEG 3/24 Sputum culture with MRSA 4/20: trach was changed to cuffless #8 5/1: Mental status appears to be somewhat improving off narcotics, more awake alert following simple commands(able to grimace, move left toes, track vertically with right eye).  Speech following. Trach downsized to [employer] cuffless #6 XLT 5/4: Modafinil  held, low threshold to resume if mental status/ability to interact diminishes 5/10: PEG dislodged, foley bulb placed and IR consulted to replace tube 5/11: IR replaced PEG tube 5/13 - 5/14: + Fever.  Restarted antibiotics.  Urine culture + E. Coli, foley catheter changed. 5/17 Tracheostomy changed.   5/18: Completed antibiotics for UTI 5/23: Stable, no significant change, awaiting disability for placement. 6-11 Tracheostomy de cannulated 01/29/2024  after RT couldn't pass suction catheter.  6/27: placed on 3 L oxygen overnight, tachypnea, increased secretion. Started IV Unasyn . CCM consulted.  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 2D echo showed an EF of 60% A1c of 4.1 LDL 96. He was also treated with hypertonic saline. Follow-up CT on 11/17/2023 showed reduction in size and hemorrhage. He is currently PEG tube dependent. Patient had an extremely poor prognosis  Acute respiratory failure with hypoxia secondary to aspiration pneumonia in the setting of a large pontine stroke unable to protect his airway: Decannulated 01/30/2024 after speech could not pass suction catheter. CCM is following. Per CCM if patient developed a respiratory distress and unable to maintain airway or clearing secretion will need to have a low threshold to reintubate. On 02/15/2024 patient developed respiratory distress. 02/16/2024 oxygen requirements were increased to 15 L culture grew staph and Klebsiella was transition to Vanco and cefepime . CCM is on board, last day of antibiotics over 7/10//2025 Had an episode of hypoxia on 7.6.2025, PCCM consulted they will discuss with family and ENT for replacement of tracheostomy for secretion management. Will need of nursing facility.  MRSA and Klebsiella pneumonia: Continue IV vancomycin  and cefepime .  Complicated E. coli UTI: Needs exchange of Foley catheter on 03/01/2024. He completed his antibiotic regimen.  Essential hypertension: Continue Norvasc  losartan  and Lopressor . Hydralazine  will need twice daily.  Elevated LFTs: Improved.  Moderate protein caloric malnutrition: Chronic PEG tube continue nutrition.  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 Continue chronic lubrication 4 times a day.  Acute kidney injury: Resolved.  Mild hyperkalemia: Recheck basic metabolic panel today.  Macrocytic anemia/thrombocytosis: Now resolved.  Perineal skin injury: Patient on admission: Continue Gearhart Butt cream.  B12 and folate deficiency: Continue repletion.  Goals of care: Previous physician spoken to neurologist as he had no improvement since admission neurology considers him a very poor prognosis with no meaningful recovery. Family met with previous physician in person and they have decided that the patient is not safe to go home as he will require 24-hour care and they would like to pursue placement once found. They are not willing to consider de-escalation of care despite his extremely poor prognosis and little chance of meaningful recovery.  Disposition: Essentially awaiting on insurance/disability then plan is for SNF. Very futile care but family adamant    DVT prophylaxis: lovenox  Family Communication: Mother and fianc Status is: Inpatient Remains inpatient appropriate because: Intracranial hemorrhage    Code Status:     Code Status Orders  (From admission, onward)           Start     Ordered   11/01/23 1736  Do not attempt resuscitation (DNR) Pre-Arrest Interventions Desired  (Code Status)  Continuous       Question Answer Comment  If pulseless and not breathing No CPR or chest compressions.   In Pre-Arrest Conditions (Patient Has Pulse and Is Breathing) May intubate, use advanced airway interventions and cardioversion/ACLS medications if appropriate or indicated. May transfer to ICU.   Consent: Discussion documented in EHR or advanced directives reviewed      11/01/23 1735           Code Status History     Date Active Date Inactive Code Status Order ID Comments User Context   10/27/2023 2229 11/01/2023 1735 Full Code [id number]  [name], MD ED   10/27/2023 2205 10/27/2023 2229 Full Code [id number]  [name], MD ED         IV Access:   Peripheral IV   Procedures and diagnostic studies:   DG CHEST PORT 1 VIEW Result Date: 02/24/2024 CLINICAL DATA:  33498 Respiratory failure (HCC) 66501 EXAM: PORTABLE CHEST - 1 VIEW COMPARISON:  February 21, 2024 FINDINGS: Low lung volumes with bronchovascular crowding. Mild elevation of the right hemidiaphragm. Patchy retrocardiac airspace opacities. No pleural effusion or pneumothorax. Moderate cardiomegaly. No acute fracture or destructive lesions. Multilevel thoracic osteophytosis. IMPRESSION: 1. Unchanged patchy retrocardiac airspace opacities, which may represent atelectasis or a bronchopneumonia, in the correct clinical context. 2. Similar moderate cardiomegaly. Electronically Signed   By: Rogelia Myers M.D.   On: 02/24/2024 13:43      Medical Consultants:   None.   Subjective:    Garrett [name] nonverbal  Objective:    Vitals:   02/24/24 2004 02/24/24 2337 02/25/24 0340 02/25/24 0828  BP: (!) 145/95 (!) 115/93 134/83 133/83  Pulse: 75 69 69 75  Resp: 18 18 18 17   Temp: 98.5 F (36.9 C) 98.7 F (37.1 C) 100 F (37.8 C)   TempSrc: Oral Oral Oral Oral  SpO2: 100% 100% 97% 98%  Weight:      Height:       SpO2: 98 % O2 Flow Rate (L/min): 2 L/min FiO2 (%): 28 %   Intake/Output Summary (Last 24 hours) at 02/25/2024 0831 Last data filed at 02/25/2024 0645 Gross per 24 hour  Intake 1030 ml  Output 2250  ml  Net -1220 ml   Filed Weights   02/19/24 0342 02/20/24 0413 02/21/24 0415  Weight: 97 kg 97 kg 98 kg    Exam: General exam: In no acute distress. Respiratory system: Good air movement and clear to auscultation. Cardiovascular system: S1 & S2 heard, RRR. No JVD. Gastrointestinal system: Abdomen is nondistended, soft and nontender.  Extremities: No pedal edema. Skin: No rashes, lesions or ulcers   Data Reviewed:    Labs: Basic Metabolic  Panel: Recent Labs  Lab 02/21/24 0418 02/22/24 0955 02/23/24 0500 02/24/24 1455 02/25/24 0435  NA 138 143 136 135 138  K 5.2* 4.1 4.8 4.7 5.1  CL 98 99 96* 95* 95*  CO2 27 33* 31 31 32  GLUCOSE 120* 111* 114* 180* 136*  BUN 22* 19 22* 19 20  CREATININE 0.47* 0.44* 0.36* 0.32* 0.43*  CALCIUM 9.3 9.4 9.1 9.1 9.2  MG  --   --   --   --  2.3  PHOS  --   --   --   --  5.5*   GFR Estimated Creatinine Clearance: 129.5 mL/min (A) (by C-G formula based on SCr of 0.43 mg/dL (L)). Liver Function Tests: Recent Labs  Lab 02/24/24 1455 02/25/24 0435  AST 36 39  ALT 83* 91*  ALKPHOS 57 56  BILITOT 0.3 0.2  PROT 7.2 7.0  ALBUMIN  2.7* 2.6*   No results for input(s): LIPASE, AMYLASE in the last 168 hours. No results for input(s): AMMONIA in the last 168 hours. Coagulation profile No results for input(s): INR, PROTIME in the last 168 hours. COVID-19 Labs  No results for input(s): DDIMER, FERRITIN, LDH, CRP in the last 72 hours.  Lab Results  Component Value Date   SARSCOV2NAA NEGATIVE 01/20/2024    CBC: Recent Labs  Lab 02/19/24 1225 02/20/24 0905 02/21/24 0418 02/24/24 1455 02/25/24 0435  WBC 15.2* 13.2* 16.0* 15.9* 16.7*  NEUTROABS  --   --   --  12.6* 12.6*  HGB 9.4* 9.8* 10.8* 10.7* 9.8*  HCT 32.2* 32.8* 37.4* 34.6* 34.5*  MCV 95.0 95.3 98.7 95.3 100.6*  PLT 430* 404* 440* 519* 574*   Cardiac Enzymes: No results for input(s): CKTOTAL, CKMB, CKMBINDEX, TROPONINI in the last 168 hours. BNP (last 3 results) No results for input(s): PROBNP in the last 8760 hours. CBG: Recent Labs  Lab 02/24/24 1100 02/24/24 1406 02/24/24 1738 02/24/24 2134 02/25/24 0622  GLUCAP 187* 108* 148* 142* 157*   D-Dimer: No results for input(s): DDIMER in the last 72 hours. Hgb A1c: No results for input(s): HGBA1C in the last 72 hours. Lipid Profile: No results for input(s): CHOL, HDL, LDLCALC, TRIG, CHOLHDL, LDLDIRECT in the last 72  hours. Thyroid function studies: No results for input(s): TSH, T4TOTAL, T3FREE, THYROIDAB in the last 72 hours.  Invalid input(s): FREET3 Anemia work up: No results for input(s): VITAMINB12, FOLATE, FERRITIN, TIBC, IRON, RETICCTPCT in the last 72 hours. Sepsis Labs: Recent Labs  Lab 02/20/24 0905 02/21/24 0418 02/24/24 1455 02/25/24 0435  WBC 13.2* 16.0* 15.9* 16.7*   Microbiology Recent Results (from the past 240 hours)  Expectorated Sputum Assessment w Gram Stain, Rflx to Resp Cult     Status: None   Collection Time: 02/15/24 10:03 AM   Specimen: Sputum  Result Value Ref Range Status   Specimen Description SPUTUM  Final   Special Requests NONE  Final   Sputum evaluation   Final    THIS SPECIMEN IS ACCEPTABLE FOR SPUTUM CULTURE Performed at [facility]  [facility], [address] [place], [name] [address]    Report Status 02/15/2024 FINAL  Final  Culture, Respiratory w Gram Stain     Status: None   Collection Time: 02/15/24 10:03 AM   Specimen: SPU  Result Value Ref Range Status   Specimen Description SPUTUM  Final   Special Requests NONE Reflexed from [id number]  Final   Gram Stain   Final    MODERATE WBC PRESENT, PREDOMINANTLY PMN ABUNDANT GRAM POSITIVE COCCI IN PAIRS IN CLUSTERS MODERATE GRAM NEGATIVE RODS RARE GRAM POSITIVE RODS Performed at [facility], [address], [place], [name] [address]    Culture   Final    ABUNDANT METHICILLIN RESISTANT STAPHYLOCOCCUS AUREUS RARE KLEBSIELLA OXYTOCA    Report Status 02/18/2024 FINAL  Final   Organism ID, Bacteria KLEBSIELLA OXYTOCA  Final      Susceptibility   Klebsiella oxytoca - MIC*    AMPICILLIN  RESISTANT Resistant     CEFEPIME  <=0.12 SENSITIVE Sensitive     CEFTAZIDIME <=1 SENSITIVE Sensitive     CEFTRIAXONE  <=0.25 SENSITIVE Sensitive     CIPROFLOXACIN  <=0.25 SENSITIVE Sensitive     GENTAMICIN <=1 SENSITIVE Sensitive     IMIPENEM 0.5 SENSITIVE Sensitive     TRIMETH/SULFA <=20  SENSITIVE Sensitive     AMPICILLIN /SULBACTAM 8 SENSITIVE Sensitive     PIP/TAZO <=4 SENSITIVE Sensitive ug/mL    * RARE KLEBSIELLA OXYTOCA  MRSA Next Gen by PCR, Nasal     Status: Abnormal   Collection Time: 02/16/24  1:49 PM   Specimen: Nasal Mucosa; Nasal Swab  Result Value Ref Range Status   MRSA by PCR Next Gen DETECTED (A) NOT DETECTED Final    Comment: RESULT CALLED TO, READ BACK BY AND VERIFIED WITH: RN RACHEL CRESSMAN [phone] [id number] BY J RAZZAK, MT (NOTE) The GeneXpert MRSA Assay (FDA approved for NASAL specimens only), is one component of a comprehensive MRSA colonization surveillance program. It is not intended to diagnose MRSA infection nor to guide or monitor treatment for MRSA infections. Test performance is not FDA approved in patients less than 81 years old. Performed at [facility], [address] [place], [name] [address]      Medications:    amLODipine   10 mg Per Tube Daily   artificial tears   Left Eye Q8H   Chlorhexidine  Gluconate Cloth  6 each Topical Daily   enoxaparin  (LOVENOX ) injection  40 mg Subcutaneous Daily   famotidine   20 mg Per Tube BID   feeding supplement (OSMOLITE 1.5 CAL)  237 mL Per Tube 5 X Daily   feeding supplement (PROSource TF20)  60 mL Per Tube TID   fiber supplement (BANATROL TF)  60 mL Per Tube BID   free water   150 mL Per Tube Q4H   hydrALAZINE   10 mg Per Tube BID   insulin  aspart  0-15 Units Subcutaneous 5 times per day   losartan   100 mg Per Tube Daily   metoprolol  tartrate  100 mg Per Tube BID   multivitamin with minerals  1 tablet Per Tube Daily   mouth rinse  15 mL Mouth Rinse 4 times per day   Continuous Infusions:  ceFEPime  (MAXIPIME ) IV 2 g (02/25/24 0658)   vancomycin  Stopped (02/25/24 0100)      LOS: 121 days   Garrett [name]  [facility]  02/25/2024, 8:31 AM

## 2024-02-25 NOTE — Progress Notes (Signed)
 NAME:  Garrett [name], MRN:  [id number], DOB:  1975-10-08, LOS: 121 ADMISSION DATE:  10/27/2023, CONSULTATION DATE:  10/27/23 REFERRING MD:  Dr Madelyne, CHIEF COMPLAINT:  Found down   History of Present Illness:  48 y/o male with past medical history of hypertension who was found down for unknown downtime by his fiance when she came home from work. Reportedly, having agonal breathing.  He apparently had driven her to work this morning.  He has HTN but never checks his BP and does not follow with MD.  She says you can tell when his BP is really high; his eyes get blood shot and he is sweating.  No h/o seizures.  Besides almost daily [name] and cigarette smoking, she is not aware of any other drugs. Drug screen positive for opioids and he was given several doses of Narcan . Patient found to have:  1. 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 Basal cisterns and fourth ventricle are effaced without hydrocephalus at this time. 2. Trace additional subarachnoid hemorrhage along the left parietal convexity. BP in ED 262/156. His CXR revealed a RUL and perihilar infiltrates suggesting aspiration pneumonia. ED labs also revealed PH 7.169, LA 4.4, CPK 985. Patient was intubated in ED.  Pertinent  Medical History  Hypertension  Significant Hospital Events: Including procedures, antibiotic start and stop dates in addition to other pertinent events   3/8: Intubated in ED, pontine bleed/SAH. CT head 17:09 Acute large hemorrhage 4.1 cm in pons with intraventricular extension without hydrocephalus 3/9: CTA 03:14 AM No change in IPH in pons, similar biparietal [facility] 3/14: Now awake, appears locked in can follow commands with eyes. 3/16 Purposeful with left upper extremity  3/22 tracheostomy placed at bedside, bleeding issues overnight from trach site 3/24 PEG (Dr. Sebastian) 3/24 sputum  culture with MRSA 3/27 vomited. TF on hold. Abd film c/w ileus. Added reglan , got SSE. Did tolerate PSV all day  3/28 BMs x2 after SSE day prior. Added back TFs at 1/2 rated. Tolerated PS 3/29 Tolerated PS  3/31 Tolerating CPAP PS 15/5, TF on hold due to ileus. 4/1: con't to hold tube feeds today, restarting vancomycin  and sending tracheal aspirate as peaks are 37, plat 24, driving 19. Thick secretions. Fever overnight.  4/2: peaks improved. Ongoing hiccups. Trickle feeding. Neuro exam unchanged.  4/20: trach was changed to cuffless #8 4/28: not safe to swallow. Limited communication and he follows simple commands. On TF 5/1: on TC 28%, with large secretions. Working with speech and he is improving to initiate PMV trials under ST supervision   5/5: on TC 30%, 7 L/min. Trach #6 cuffless, changed 5/1 to facilitate PMV trials  5/12: on TC 21%, 6 L/min. Trach #6 cuffless, unable to produce sounds or speak on PMV. No significant resp secretions. On PEG TF 01/29/2024 tracheostomy was removed due to failure to well provide adequate airway 02/15/2024 pulmonary critical care consult. 7/7 patient obtunded and desaturating on NRB; transfer to icu for intubation; updated mother over phone who wants to reverse code status to full code  Interim History / Subjective:   Unresponsive; no cough/gag reflex On NRB w/ sats upper 80s Abg 7.07 and co2 too high to calculate  Objective    Blood pressure 129/83, pulse 93, temperature 99.1 F (37.3 C), temperature source Axillary, resp. rate (!) 26, height 5' 8 (1.727 m), weight 98 kg, SpO2 94%.  Intake/Output Summary (Last 24 hours) at 02/25/2024 1420 Last data filed at 02/25/2024 1100 Gross per 24 hour  Intake 1724 ml  Output 1700 ml  Net 24 ml   Filed Weights   02/19/24 0342 02/20/24 0413 02/21/24 0415  Weight: 97 kg 97 kg 98 kg    Examination: General: critically ill appearing on mech vent HEENT: MM pink/moist; ETT in place Neuro: unresponsive  to pain; no cough/gag reflex CV: s1s2, tachy 110s, no m/r/g PULM:  dim rhonchi BS bilaterally; NRB w/ sats upper 80s GI: soft, bsx4 active  Extremities: warm/dry   Resolved problem list   Assessment and Plan   Acute hypoxic respiratory failure due to aspiration pneumonia - s/p tracheostomy placement 3/22 decannulated on 6/10 due to inability to pass suction catheter; reintubated on 7/7 MRSA and Klebsiella PNA  Received 7 day course of vancomycin . Overnight 3/31 with ongoing fever. 4/1 exam with peaks 37, plat 24, driving 19. Thick secretions. Added vanc. 4/2 with improved pressures.  Plan: -transfer to icu to intubate -cxr post intubation -send repeat respiratory culture -cont vanc/cefepime  -LTVV strategy with tidal volumes of 6-8 cc/kg ideal body weight -check ABG and adjust settings accordingly -Wean PEEP/FiO2 for SpO2 >92% -VAP bundle in place -Daily SAT and SBT -PAD protocol in place -wean sedation for RASS goal 0 to -1 -pulm toiletry -chest PT  -will reach out to ENT for trach redo  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  CTA head and neck no LVO. CT head code stroke with large 4.1cm hemorrhage in pons with IV extension and extension into basal cisterns. Worsening mental status/posturing on 3/29 however CT head stable.  Plan: -if mental status does not improve with correcting severe hypercapnia consider repeat CT scan -limit sedating meds  Post sedation hypotension Plan: -levo for map goal >65 -iv fluids -given 2 amps bicarb with severe respiratory acidosis; repeat abg in hour  Complicated E. Coli UTI Plan: -needs foley exchange on 03/01/2024  Moderate protein calorie malnutrition Dysphagia Plan: -PEG tube in place -hold TF today -consider resuming tomorrow slowly per RD   HTN Plan: -hold bp post intubation while bp soft  Elevated LFTs Plan: -improved -trend  Hyperglycemia Plan: -ssi and cbg monitoring  Macrocytic anemia   Thrombocytosis  -No report of chronic alcohol  use. B12, folate and TSH normal Plan: - trend cbc    Poor Dentition upper right teeth and one left lower tooth have chipped off. Plan: - dental evaluation eventually   Best Practice (right click and Reselect all SmartList Selections daily)   Diet/type: NPO w/ meds via tube DVT prophylaxis: LMWH GI prophylaxis: H2B Lines: N/A Foley:  Yes, and it is still needed Code Status:  full code Last date of multidisciplinary goals of care discussion [7/7 called patient's mother regarding respiratory failure and needing intubation. She agrees and asks to reverse code status fully. Now okay with full code.]   CCT: 45 minutes  JD Emilio RIGGERS [facility] [facility] 02/25/2024, 3:06 PM  Please see [email or web address] for pager details.  From 7A-7P if no response, please call [phone]. After hours, please call ELink [phone].

## 2024-02-25 NOTE — Procedures (Addendum)
 Intubation Procedure Note  Garrett [name]  [id number]  [birth year]  Date:02/25/24  Time:4:00 PM   Provider Performing:John D Emilio    Procedure: Intubation (31500)  Indication(s) Respiratory Failure  Consent Unable to obtain consent due to emergent nature of procedure.   Anesthesia Etomidate , Versed , and Fentanyl    Time Out Verified patient identification, verified procedure, site/side was marked, verified correct patient position, special equipment/implants available, medications/allergies/relevant history reviewed, required imaging and test results available.   Sterile Technique Usual hand hygeine, masks, and gloves were used   Procedure Description Patient positioned in bed supine.  Sedation given as noted above.  Patient was intubated with endotracheal tube using Glidescope.  View was Grade 1 full glottis .  Number of attempts was 1.  Colorimetric CO2 detector was consistent with tracheal placement.   Complications/Tolerance None; patient tolerated the procedure well. Chest X-ray is ordered to verify placement.   EBL Minimal   Specimen(s) None  JD Emilio RIGGERS  [facility] Critical Care 02/25/2024, 4:00 PM  Please see [email or web address] for pager details.  From 7A-7P if no response, please call [phone]. After hours, please call ELink [phone].  Xxxxx STAFF MD   Personally supervised the intubation     SIGNATURE    Dr. Dorethia Cave, M.D., F.C.C.P,  Pulmonary and Critical Care Medicine Staff Physician, [facility] Center Director - Interstitial Lung Disease  Program  Pulmonary Fibrosis [facility] Network at [facility] [place], [name], [address]   Pager: [phone], If no answer  -> Check AMION or Try [phone] Telephone (clinical office): [phone] Telephone (research): [phone]  5:20 PM 02/25/2024

## 2024-02-25 NOTE — Progress Notes (Signed)
 Peripherally Inserted Central Catheter Placement  The IV Nurse has discussed with the patient and/or persons authorized to consent for the patient, the purpose of this procedure and the potential benefits and risks involved with this procedure.  The benefits include less needle sticks, lab draws from the catheter, and the patient may be discharged home with the catheter. Risks include, but not limited to, infection, bleeding, blood clot (thrombus formation), and puncture of an artery; nerve damage and irregular heartbeat and possibility to perform a PICC exchange if needed/ordered by physician.  Alternatives to this procedure were also discussed.  Bard Power PICC patient education guide, fact sheet on infection prevention and patient information card has been provided to patient /or left at bedside.    PICC Placement Documentation  PICC Single Lumen  (Active)  Indication for Insertion or Continuance of Line Vasoactive infusions 02/25/24 1747  Exposed Catheter (cm) 0 cm 02/25/24 1747  Site Assessment Clean, Dry, Intact 02/25/24 1747  Line Status Flushed;Saline locked;Blood return noted 02/25/24 1747  Dressing Type Transparent;Securing device 02/25/24 1747  Dressing Status Antimicrobial disc/dressing in place;Clean, Dry, Intact 02/25/24 1747  Line Adjustment (NICU/IV Team Only) No 02/25/24 1747  Dressing Change Due 03/03/24 02/25/24 1747     PICC Double Lumen 02/25/24 Right Brachial 40 cm 0 cm (Active)  Indication for Insertion or Continuance of Line Vasoactive infusions 02/25/24 1753  Exposed Catheter (cm) 0 cm 02/25/24 1753  Site Assessment Clean, Dry, Intact 02/25/24 1753  Lumen #1 Status Flushed;Saline locked;Blood return noted 02/25/24 1753  Lumen #2 Status Flushed;Blood return noted;Saline locked 02/25/24 1753  Dressing Type Transparent;Securing device 02/25/24 1753  Dressing Status Antimicrobial disc/dressing in place;Clean, Dry, Intact 02/25/24 1753  Line Care Connections checked and  tightened 02/25/24 1753  Line Adjustment (NICU/IV Team Only) No 02/25/24 1753  Dressing Change Due 03/03/24 02/25/24 1753       Bonni Rock Larve 02/25/2024, 5:55 PM

## 2024-02-25 NOTE — Plan of Care (Signed)
  Problem: Nutrition: Goal: Risk of aspiration will decrease Outcome: Progressing Goal: Dietary intake will improve Outcome: Progressing   Problem: Skin Integrity: Goal: Risk for impaired skin integrity will decrease Outcome: Progressing   Problem: Clinical Measurements: Goal: Ability to maintain clinical measurements within normal limits will improve Outcome: Progressing Goal: Will remain free from infection Outcome: Progressing Goal: Diagnostic test results will improve Outcome: Progressing Goal: Respiratory complications will improve Outcome: Progressing Goal: Cardiovascular complication will be avoided Outcome: Progressing   Problem: Safety: Goal: Ability to remain free from injury will improve Outcome: Progressing

## 2024-02-25 NOTE — Progress Notes (Signed)
 Jerre Cedar, PA aware that patient's EKG is now in results. PA acknowledged and stated he would take a look at it.

## 2024-02-26 DIAGNOSIS — J9691 Respiratory failure, unspecified with hypoxia: Secondary | ICD-10-CM | POA: Diagnosis not present

## 2024-02-26 DIAGNOSIS — I10 Essential (primary) hypertension: Secondary | ICD-10-CM | POA: Diagnosis not present

## 2024-02-26 DIAGNOSIS — E440 Moderate protein-calorie malnutrition: Secondary | ICD-10-CM | POA: Diagnosis not present

## 2024-02-26 DIAGNOSIS — D509 Iron deficiency anemia, unspecified: Secondary | ICD-10-CM | POA: Diagnosis not present

## 2024-02-26 DIAGNOSIS — Z09 Encounter for follow-up examination after completed treatment for conditions other than malignant neoplasm: Secondary | ICD-10-CM | POA: Diagnosis not present

## 2024-02-26 DIAGNOSIS — R1310 Dysphagia, unspecified: Secondary | ICD-10-CM | POA: Diagnosis not present

## 2024-02-26 DIAGNOSIS — Z8709 Personal history of other diseases of the respiratory system: Secondary | ICD-10-CM

## 2024-02-26 DIAGNOSIS — J9601 Acute respiratory failure with hypoxia: Secondary | ICD-10-CM | POA: Diagnosis not present

## 2024-02-26 DIAGNOSIS — I613 Nontraumatic intracerebral hemorrhage in brain stem: Secondary | ICD-10-CM | POA: Diagnosis not present

## 2024-02-26 DIAGNOSIS — F17210 Nicotine dependence, cigarettes, uncomplicated: Secondary | ICD-10-CM | POA: Diagnosis not present

## 2024-02-26 DIAGNOSIS — I952 Hypotension due to drugs: Secondary | ICD-10-CM | POA: Diagnosis not present

## 2024-02-26 DIAGNOSIS — J150 Pneumonia due to Klebsiella pneumoniae: Secondary | ICD-10-CM | POA: Diagnosis not present

## 2024-02-26 DIAGNOSIS — J690 Pneumonitis due to inhalation of food and vomit: Secondary | ICD-10-CM | POA: Diagnosis not present

## 2024-02-26 LAB — CBC
HCT: 27 % — ABNORMAL LOW (ref 39.0–52.0)
Hemoglobin: 7.9 g/dL — ABNORMAL LOW (ref 13.0–17.0)
MCH: 28.5 pg (ref 26.0–34.0)
MCHC: 29.3 g/dL — ABNORMAL LOW (ref 30.0–36.0)
MCV: 97.5 fL (ref 80.0–100.0)
Platelets: 498 K/uL — ABNORMAL HIGH (ref 150–400)
RBC: 2.77 MIL/uL — ABNORMAL LOW (ref 4.22–5.81)
RDW: 15.6 % — ABNORMAL HIGH (ref 11.5–15.5)
WBC: 17.1 K/uL — ABNORMAL HIGH (ref 4.0–10.5)
nRBC: 0.3 % — ABNORMAL HIGH (ref 0.0–0.2)

## 2024-02-26 LAB — COMPREHENSIVE METABOLIC PANEL WITH GFR
ALT: 82 U/L — ABNORMAL HIGH (ref 0–44)
AST: 37 U/L (ref 15–41)
Albumin: 2.3 g/dL — ABNORMAL LOW (ref 3.5–5.0)
Alkaline Phosphatase: 58 U/L (ref 38–126)
Anion gap: 7 (ref 5–15)
BUN: 22 mg/dL — ABNORMAL HIGH (ref 6–20)
CO2: 32 mmol/L (ref 22–32)
Calcium: 9.2 mg/dL (ref 8.9–10.3)
Chloride: 99 mmol/L (ref 98–111)
Creatinine, Ser: 0.54 mg/dL — ABNORMAL LOW (ref 0.61–1.24)
GFR, Estimated: >60 mL/min (ref >60)
Glucose, Bld: 96 mg/dL (ref 70–99)
Potassium: 3.6 mmol/L (ref 3.5–5.1)
Sodium: 138 mmol/L (ref 135–145)
Total Bilirubin: 0.8 mg/dL (ref 0.0–1.2)
Total Protein: 6.2 g/dL — ABNORMAL LOW (ref 6.5–8.1)

## 2024-02-26 LAB — GLUCOSE, CAPILLARY
Glucose-Capillary: 115 mg/dL — ABNORMAL HIGH (ref 70–99)
Glucose-Capillary: 116 mg/dL — ABNORMAL HIGH (ref 70–99)
Glucose-Capillary: 128 mg/dL — ABNORMAL HIGH (ref 70–99)
Glucose-Capillary: 132 mg/dL — ABNORMAL HIGH (ref 70–99)
Glucose-Capillary: 137 mg/dL — ABNORMAL HIGH (ref 70–99)
Glucose-Capillary: 98 mg/dL (ref 70–99)
Glucose-Capillary: 99 mg/dL (ref 70–99)

## 2024-02-26 MED ORDER — OSMOLITE 1.5 CAL PO LIQD
1000.0000 mL | ORAL | Status: AC
  Administered 2024-02-26 – 2024-03-07 (×8): 1000 mL
  Filled 2024-02-26 (×2): qty 1000

## 2024-02-26 MED ORDER — PROSOURCE TF20 ENFIT COMPATIBL EN LIQD
60.0000 mL | Freq: Two times a day (BID) | ENTERAL | Status: DC
  Administered 2024-02-26 – 2024-03-17 (×42): 60 mL
  Filled 2024-02-26 (×42): qty 60

## 2024-02-26 MED ORDER — VITAL HP 1.0 CAL PO LIQD: 1000.0000 mL | ORAL | Status: DC

## 2024-02-26 MED ORDER — AMLODIPINE BESYLATE 5 MG PO TABS
5.0000 mg | ORAL_TABLET | Freq: Every day | ORAL | Status: DC
  Administered 2024-02-26 – 2024-04-16 (×54): 5 mg
  Filled 2024-02-26 (×25): qty 1
  Filled 2024-02-26: qty 2
  Filled 2024-02-26 (×26): qty 1

## 2024-02-26 MED ORDER — METOPROLOL TARTRATE 50 MG PO TABS
50.0000 mg | ORAL_TABLET | Freq: Two times a day (BID) | ORAL | Status: DC
  Administered 2024-02-26 – 2024-03-25 (×57): 50 mg
  Filled 2024-02-26 (×57): qty 1

## 2024-02-26 MED ORDER — HYDRALAZINE HCL 20 MG/ML IJ SOLN
10.0000 mg | INTRAMUSCULAR | Status: DC | PRN
  Administered 2024-03-22 – 2024-04-06 (×5): 20 mg via INTRAVENOUS
  Filled 2024-02-26: qty 2
  Filled 2024-02-26 (×5): qty 1

## 2024-02-26 MED ORDER — FAMOTIDINE 20 MG PO TABS
20.0000 mg | ORAL_TABLET | Freq: Two times a day (BID) | ORAL | Status: DC
  Administered 2024-02-26 – 2024-06-21 (×238): 20 mg
  Filled 2024-02-26 (×231): qty 1

## 2024-02-26 MED ORDER — POTASSIUM CHLORIDE 20 MEQ PO PACK
40.0000 meq | PACK | Freq: Once | ORAL | Status: AC
  Administered 2024-02-26: 40 meq
  Filled 2024-02-26: qty 2

## 2024-02-26 MED ORDER — POLYMYXIN B-TRIMETHOPRIM 10000-0.1 UNIT/ML-% OP SOLN
2.0000 [drp] | Freq: Four times a day (QID) | OPHTHALMIC | Status: AC
  Administered 2024-02-26 – 2024-03-04 (×28): 2 [drp] via OPHTHALMIC
  Filled 2024-02-26: qty 10

## 2024-02-26 NOTE — Progress Notes (Signed)
 NAME:  Garrett [name], MRN:  [id number], DOB:  Mar 30, 1976, LOS: 122 ADMISSION DATE:  10/27/2023, CONSULTATION DATE:  10/27/23 REFERRING MD:  Dr Madelyne, CHIEF COMPLAINT:  Found down   History of Present Illness:  48 y/o male with past medical history of hypertension who was found down for unknown downtime by his fiance when she came home from work. Reportedly, having agonal breathing.  He apparently had driven her to work this morning.  He has HTN but never checks his BP and does not follow with MD.  She says you can tell when his BP is really high; his eyes get blood shot and he is sweating.  No h/o seizures.  Besides almost daily [name] and cigarette smoking, she is not aware of any other drugs. Drug screen positive for opioids and he was given several doses of Narcan . Patient found to have:  1. 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 Basal cisterns and fourth ventricle are effaced without hydrocephalus at this time. 2. Trace additional subarachnoid hemorrhage along the left parietal convexity. BP in ED 262/156. His CXR revealed a RUL and perihilar infiltrates suggesting aspiration pneumonia. ED labs also revealed PH 7.169, LA 4.4, CPK 985. Patient was intubated in ED.  Pertinent  Medical History  Hypertension  Significant Hospital Events: Including procedures, antibiotic start and stop dates in addition to other pertinent events   3/8: Intubated in ED, pontine bleed/SAH. CT head 17:09 Acute large hemorrhage 4.1 cm in pons with intraventricular extension without hydrocephalus 3/9: CTA 03:14 AM No change in IPH in pons, similar biparietal [facility] 3/14: Now awake, appears locked in can follow commands with eyes. 3/16 Purposeful with left upper extremity  3/22 tracheostomy placed at bedside, bleeding issues overnight from trach site 3/24 PEG (Dr. Sebastian) 3/24 sputum  culture with MRSA 3/27 vomited. TF on hold. Abd film c/w ileus. Added reglan , got SSE. Did tolerate PSV all day  3/28 BMs x2 after SSE day prior. Added back TFs at 1/2 rated. Tolerated PS 3/29 Tolerated PS  3/31 Tolerating CPAP PS 15/5, TF on hold due to ileus. 4/1: con't to hold tube feeds today, restarting vancomycin  and sending tracheal aspirate as peaks are 37, plat 24, driving 19. Thick secretions. Fever overnight.  4/2: peaks improved. Ongoing hiccups. Trickle feeding. Neuro exam unchanged.  4/20: trach was changed to cuffless #8 4/28: not safe to swallow. Limited communication and he follows simple commands. On TF 5/1: on TC 28%, with large secretions. Working with speech and he is improving to initiate PMV trials under ST supervision   5/5: on TC 30%, 7 L/min. Trach #6 cuffless, changed 5/1 to facilitate PMV trials  5/12: on TC 21%, 6 L/min. Trach #6 cuffless, unable to produce sounds or speak on PMV. No significant resp secretions. On PEG TF 01/29/2024 tracheostomy was removed due to failure to well provide adequate airway 02/15/2024 pulmonary critical care consult. 7/7 patient obtunded and desaturating on NRB; transfer to icu for intubation; updated mother over phone who wants to reverse code status to full code; ent consulted  Interim History / Subjective:   Remains intubated on vent 40% fio2 sats 99% More awake today with eyes open wandering but not quite tracking. Cough-gag present  Objective    Blood pressure (!) 138/105, pulse (!) 115, temperature 98.2 F (36.8 C), temperature source Axillary, resp. rate (!) 24, height 5' 8 (1.727 m), weight 102 kg,  SpO2 98%.    Vent Mode: PRVC FiO2 (%):  [40 %-100 %] 40 % Set Rate:  [18 bmp-24 bmp] 24 bmp Vt Set:  [540 mL-580 mL] 540 mL PEEP:  [5 cmH20-8 cmH20] 8 cmH20 Plateau Pressure:  [17 cmH20-23 cmH20] 23 cmH20   Intake/Output Summary (Last 24 hours) at 02/26/2024 0720 Last data filed at 02/26/2024 0600 Gross per 24 hour  Intake  4221 ml  Output 1030 ml  Net 3191 ml   Filed Weights   02/21/24 0415 02/25/24 1518 02/26/24 0500  Weight: 98 kg 93 kg 102 kg    Examination: General: critically ill appearing on mech vent HEENT: MM pink/moist; ETT in place; left eye redness with some drainage Neuro: eyes open not quite tracking; cough-gag present; slight withdrawal in LLE per nurse CV: s1s2, tachy 100s, no m/r/g PULM:  dim rhonchi BS bilaterally; vent PRVC 40% fio2 GI: soft, bsx4 active; peg tube in place; diffuse trace edema   Resolved problem list   Assessment and Plan   Acute hypoxic respiratory failure due to aspiration pneumonia - s/p tracheostomy placement 3/22 decannulated on 6/10 due to inability to pass suction catheter; reintubated on 7/7 MRSA and Klebsiella PNA  Received 7 day course of vancomycin . Overnight 3/31 with ongoing fever. 4/1 exam with peaks 37, plat 24, driving 19. Thick secretions. Added vanc. 4/2 with improved pressures.  Plan: -follow up respiratory culture from 7-7 -cont vanc/cefepime  -LTVV strategy with tidal volumes of 6-8 cc/kg ideal body weight -PSV during day as tolerated -Wean PEEP/FiO2 for SpO2 >92% -VAP bundle in place -Daily SAT and SBT -PAD protocol in place -wean sedation for RASS goal 0 to -1 -pulm toiletry -ENT consulted for trach; not able to perform this week. 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 CT head code stroke with large 4.1cm hemorrhage in pons with IV extension and extension into basal cisterns. Worsening mental status/posturing on 3/29 however CT head stable.  Plan: -limit sedating meds -restart HTN meds  Post sedation hypotension Plan: -improved  Complicated E. Coli UTI Plan: -needs foley exchange on 03/01/2024  Moderate protein calorie malnutrition Dysphagia Plan: -PEG tube in place -advance TF slowly today -RD following   HTN Plan: -resume metoprolol  and norvasc  today since  bp improved -prn hydralazine   Elevated LFTs Plan: -improved -trend  Left eye conjunctivitis Plan: -polytrim  for 7 days -cont Lacrilube to both eyes  Hyperglycemia Plan: -ssi and cbg monitoring  Macrocytic anemia  Thrombocytosis  -No report of chronic alcohol  use. B12, folate and TSH normal Plan: - trend cbc    Poor Dentition upper right teeth and one left lower tooth have chipped off. Plan: - dental evaluation eventually   Best Practice (right click and Reselect all SmartList Selections daily)   Diet/type: NPO w/ meds via tube DVT prophylaxis: LMWH GI prophylaxis: H2B Lines: N/A Foley:  Yes, and it is still needed Code Status:  full code Last date of multidisciplinary goals of care discussion [7/8 attempted to call mother [place] over phone went straight to voicemail.]   CCT: 35 minutes  JD Emilio RIGGERS [facility] 02/26/2024, 7:20 AM  Please see [email or web address] for pager details.  From 7A-7P if no response, please call [phone]. After hours, please call ELink [phone].

## 2024-02-26 NOTE — Progress Notes (Signed)
 Nutrition Follow-up  DOCUMENTATION CODES:  Not applicable  INTERVENTION:  Reinitiate continuous tube feeding via PEG: begin at 5ml/h and increase 10mL every 10 hours until goal rate of 13ml/h is reached Osmolite 1.5 @55mL /h ( daily) Prosource TF20 60 ml BID Provides 2220 kcal, 122 gm protein, 1005 ml free water  daily  MVI with minerals daily Banatrol BID to aid in adding bulk to stools Discontinue bolus feeds, discontinue free water  flushes   NUTRITION DIAGNOSIS:  Inadequate oral intake related to inability to eat as evidenced by NPO status.  Remains applicable  GOAL:  Patient will meet greater than or equal to 90% of their needs - Progressing with re-initiation of tube feeds  MONITOR:  Labs, Weight trends, TF tolerance, Skin  REASON FOR ASSESSMENT:  Consult Enteral/tube feeding initiation and management  ASSESSMENT:  Pt with PMH of HTN, daily mariajuana, and smoker admitted after being found down at home with pontine hemorrhagic stroke and trace SAH. UDS positive for opioids. Noted aspiration PNA on admission.  3/08 - admitted with [facility], intubated 3/14 - s/p cortrak placement (tip gastric) 3/22 - s/p tracheostomy 3/24 - s/p PEG 4/20 - Cuff changed to cuffless 5/10 - PEG dislodged, TF's stopped  5/11 - PEG replaced, TF's restarted at goal  6/10 - trach removed 7/7- desat, transferred back to 4N ICU, re-intubated  Pt discussed during ICU rounds with MD and RN. Pt required re-intubation after desating on progressive unit, was transferred back into ICU and remains intubated. ENT consulted for another tracheostomy, will likely be placed early next week. Continued antibiotic regimen for MRSA pneumonia. Upgraded to full code. RD re-consulted for re-initiation of tube feeds.  Pt awake in bed at time of assessment. Pt aware of surroundings, but continued limited engagement otherwise. RN flushing medications via PEG. Discussed re-initiation of continuous tube feeds via PEG  from previous bolus feeds. Discussed continued administering of banatrol to bulk stools and will continue to monitor stool output.   Patient is currently intubated on ventilator support MV: 12.5 L/min Temp (24hrs), Avg:99.8 F (37.7 C), Min:98.2 F (36.8 C), Max:101.2 F (38.4 C)  MAP (cuff):  Admit weight: 105.2kg  Current weight: 102kg   Intake/Output Summary (Last 24 hours) at 02/26/2024 0858 Last data filed at 02/26/2024 0730 Gross per 24 hour  Intake 4252.69 ml  Output 1030 ml  Net 3222.69 ml   Net IO Since Admission: -17,600.42 mL [02/26/24 0858]  Drains/Lines: PEG LUQ PICC R brachial UOP:  Nutritionally Relevant Medications: Scheduled Meds:  docusate  100 mg Per Tube BID   famotidine   20 mg Per Tube BID   feeding supplement (PROSource TF20)  60 mL Per Tube BID   fiber supplement (BANATROL TF)  60 mL Per Tube BID   insulin  aspart  0-15 Units Subcutaneous Q4H   multivitamin with minerals  1 tablet Per Tube Daily   polyethylene glycol  17 g Per Tube Daily    Continuous Infusions:  ceFEPime  (MAXIPIME ) IV Stopped (02/26/24 0605)   feeding supplement (OSMOLITE 1.5 CAL)     vancomycin  Stopped (02/26/24 0123)    Labs Reviewed: Magnesium 2.5 (7/7) Phosphorus 5.5 (7/7) CBG ranges from 55-201 mg/dL over the last 24 hours  Diet Order:   Diet Order             Diet NPO time specified  Diet effective now                  EDUCATION NEEDS: No education needs have been  identified at this time  Skin:  Skin Assessment: Skin Integrity Issues: Skin Integrity Issues:: Other (Comment) Stage II: Stage 2, perineum Other: Non pressure wound, left eye  Last BM:  7/7 type 6  Height:  Ht Readings from Last 1 Encounters:  02/25/24 5' 8 (1.727 m)   Weight:  Wt Readings from Last 1 Encounters:  02/26/24 102 kg   Ideal Body Weight:  70 kg  BMI:  Body mass index is 34.19 kg/m.  Estimated Nutritional Needs:  Kcal:  2000-2200 Protein:  125-140  grams Fluid:  >2 L/day  Josette Glance, MS, RDN, LDN Clinical Dietitian I Please reach out via secure chat

## 2024-02-26 NOTE — TOC Progression Note (Signed)
 Transition of Care [facility]) - Progression Note    Patient Details  Name: Garrett [name] MRN: [id number] Date of Birth: 1976/06/08  Transition of [facility] CM/SW Contact  Inocente GORMAN Kindle, LCSW Phone Number: 02/26/2024, 9:12 AM  Clinical Narrative:    Patient transferred to ICU and intubated. TOC received [facility] consult but patient is not eligible due to Medicaid. Patient has a long length of stay awaiting Disability to be approved prior to any SNFs accepting him. Will continue to follow.    Expected Discharge Plan: Long Term Nursing Home Barriers to Discharge: Continued Medical Work up, SNF Pending Medicaid, SNF Pending bed offer, SNF Pending payor source - LOG, Inadequate or no insurance (New trach)  Expected Discharge Plan and Services In-house Referral: Clinical Social Work, Hospice / Palliative Care   Post Acute Care Choice: Skilled Nursing Facility Living arrangements for the past 2 months: Single Family Home                                       Social Determinants of Health (SDOH) Interventions SDOH Screenings   Food Insecurity: Patient Unable To Answer (10/29/2023)  Social Connections: Unknown (06/23/2023)   Received from [facility]  Tobacco Use: High Risk (10/31/2023)    Readmission Risk Interventions     No data to display

## 2024-02-26 NOTE — Progress Notes (Signed)
 [facility] ADULT ICU REPLACEMENT PROTOCOL   The patient does apply for the [facility] Adult ICU Electrolyte Replacment Protocol based on the criteria listed below:   1.Exclusion criteria: TCTS, ECMO, Dialysis, and Myasthenia Gravis patients 2. Is GFR >/= 30 ml/min? Yes.    Patient's GFR today is >60 3. Is SCr </= 2? Yes.   Patient's SCr is 0.54 mg/dL 4. Did SCr increase >/= 0.5 in 24 hours? No. 5.Pt's weight >40kg  Yes.   6. Abnormal electrolyte(s): potassium 3.6  7. Electrolytes replaced per protocol 8.  Call MD STAT for K+ </= 2.5, Phos </= 1, or Mag </= 1 Physician:  protocol  Claretta JINNY Sharps 02/26/2024 6:45 AM

## 2024-02-26 NOTE — Plan of Care (Signed)
?  Problem: Nutrition: ?Goal: Dietary intake will improve ?Outcome: Progressing ?  ?

## 2024-02-26 NOTE — Progress Notes (Signed)
 SLP Cancellation and D/C Note  Patient Details Name: Garrett [name] MRN: [id number] DOB: 06-02-1976   Cancelled treatment:    Pt was intubated on 7/7. Our service will sign off and await new orders if appropriate. Unfortunately, his ability to participate even in subtle ways has been declining over the course of our last two sessions over two weeks' time.  [name], MA CCC/SLP Clinical Specialist - Acute Care SLP Acute Rehabilitation Services Office number [phone]       [name] 02/26/2024, 11:57 AM

## 2024-02-26 NOTE — Progress Notes (Signed)
 Hypoglycemic Event  CBG: 55  Treatment: D50 25 mL (12.5 gm)  Symptoms: None  Follow-up CBG: Time:0007 CBG Result:115  Possible Reasons for Event: Other: holding per tube nutrition  Comments/MD notified:E link notified no new orders    Kathlyne Debby SAUNDERS

## 2024-02-26 NOTE — Progress Notes (Addendum)
 PT Cancellation Note  Patient Details Name: Garrett [name] MRN: [id number] DOB: Nov 21, 1975   Cancelled Treatment:    Reason Eval/Treat Not Completed: Medical issues which prohibited therapy. Pt re-intubated on 02/25/2024. Pt is not on sedation at this time however PT will allow for further time to stabilize before initiating further mobility attempts.   [name] 02/26/2024, 8:26 AM

## 2024-02-26 NOTE — Consult Note (Addendum)
 ENT CONSULT:  Reason for Consult: Tracheostomy  Referring Physician: ICU team  HPI: Garrett [name] is an 48 y.o. 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  Patient was intubated on 10/27/2023 for respiratory failure and underwent trach on 11/10/2023 with trach 12/09/2023 to cuffless and was tolerating trach collar. On ~01/29/2024, eventually tracheostomy was removed due to concerns for accidental decannulation and as such thought did not provide adequate airway. Patient then did somewhat well without vent support with concern for respiratory failure, aspiration and subsequently was re-intubated 02/25/2024. ENT was then consulted for tracheostomy Patient unable to provide history due to intubation/mental status.   PMHx: Reviewed  Past Surgical History:  Procedure Laterality Date   IR REPLACE G-TUBE SIMPLE WO FLUORO  12/30/2023    History reviewed. No pertinent family history.  Social History:  reports that he has been smoking cigarettes. He started smoking about 30 years ago. He has a 15.3 pack-year smoking history. He has never used smokeless tobacco. He reports that he does not currently use alcohol . He reports that he does not use drugs.  Allergies:  Allergies  Allergen Reactions   Tomato Hives, Itching and Rash    Medications: I have reviewed the patient's current medications.  Results for orders placed or performed during the hospital encounter of 10/27/23 (from the past 48 hours)  Glucose, capillary     Status: Abnormal   Collection Time: 02/24/24  5:38 PM  Result Value Ref Range   Glucose-Capillary 148 (H) 70 - 99 mg/dL    Comment: Glucose reference range applies only to samples taken after fasting for at least 8 hours.   Comment 1 Notify RN    Comment 2 Document in Chart   Glucose, capillary     Status: Abnormal   Collection Time: 02/24/24  9:34 PM  Result Value  Ref Range   Glucose-Capillary 142 (H) 70 - 99 mg/dL    Comment: Glucose reference range applies only to samples taken after fasting for at least 8 hours.   Comment 1 Notify RN    Comment 2 Document in Chart   CBC with Differential/Platelet     Status: Abnormal   Collection Time: 02/25/24  4:35 AM  Result Value Ref Range   WBC 16.7 (H) 4.0 - 10.5 K/uL   RBC 3.43 (L) 4.22 - 5.81 MIL/uL   Hemoglobin 9.8 (L) 13.0 - 17.0 g/dL   HCT 65.4 (L) 60.9 - 47.9 %   MCV 100.6 (H) 80.0 - 100.0 fL   MCH 28.6 26.0 - 34.0 pg   MCHC 28.4 (L) 30.0 - 36.0 g/dL   RDW 84.4 88.4 - 84.4 %   Platelets 574 (H) 150 - 400 K/uL   nRBC 0.5 (H) 0.0 - 0.2 %   Neutrophils Relative % 76 %   Neutro Abs 12.6 (H) 1.7 - 7.7 K/uL   Lymphocytes Relative 11 %   Lymphs Abs 1.9 0.7 - 4.0 K/uL   Monocytes Relative 10 %   Monocytes Absolute 1.7 (H) 0.1 - 1.0 K/uL   Eosinophils Relative 1 %   Eosinophils Absolute 0.1 0.0 - 0.5 K/uL   Basophils Relative 0 %   Basophils Absolute 0.1 0.0 - 0.1 K/uL   Immature Granulocytes 2 %   Abs Immature Granulocytes 0.33 (H) 0.00 - 0.07 K/uL    Comment: Performed at [facility], [address] [place], [name] [address]  Comprehensive metabolic panel with  GFR     Status: Abnormal   Collection Time: 02/25/24  4:35 AM  Result Value Ref Range   Sodium 138 135 - 145 mmol/L   Potassium 5.1 3.5 - 5.1 mmol/L   Chloride 95 (L) 98 - 111 mmol/L   CO2 32 22 - 32 mmol/L   Glucose, Bld 136 (H) 70 - 99 mg/dL    Comment: Glucose reference range applies only to samples taken after fasting for at least 8 hours.   BUN 20 6 - 20 mg/dL   Creatinine, Ser 9.56 (L) 0.61 - 1.24 mg/dL   Calcium 9.2 8.9 - 89.6 mg/dL   Total Protein 7.0 6.5 - 8.1 g/dL   Albumin  2.6 (L) 3.5 - 5.0 g/dL   AST 39 15 - 41 U/L   ALT 91 (H) 0 - 44 U/L   Alkaline Phosphatase 56 38 - 126 U/L   Total Bilirubin 0.2 0.0 - 1.2 mg/dL   GFR, Estimated >39 >39 mL/min    Comment: (NOTE) Calculated using the CKD-EPI Creatinine  Equation (2021)    Anion gap 11 5 - 15    Comment: Performed at [facility], [address] [place], [name] [address]  Magnesium     Status: None   Collection Time: 02/25/24  4:35 AM  Result Value Ref Range   Magnesium 2.3 1.7 - 2.4 mg/dL    Comment: Performed at [facility], [address], [place], [name] [address]  Phosphorus     Status: Abnormal   Collection Time: 02/25/24  4:35 AM  Result Value Ref Range   Phosphorus 5.5 (H) 2.5 - 4.6 mg/dL    Comment: Performed at [facility], [address], [place], [name] [address]  Glucose, capillary     Status: Abnormal   Collection Time: 02/25/24  6:22 AM  Result Value Ref Range   Glucose-Capillary 157 (H) 70 - 99 mg/dL    Comment: Glucose reference range applies only to samples taken after fasting for at least 8 hours.   Comment 1 Notify RN    Comment 2 Document in Chart   Glucose, capillary     Status: Abnormal   Collection Time: 02/25/24 10:52 AM  Result Value Ref Range   Glucose-Capillary 173 (H) 70 - 99 mg/dL    Comment: Glucose reference range applies only to samples taken after fasting for at least 8 hours.  Blood gas, arterial     Status: Abnormal   Collection Time: 02/25/24  1:37 PM  Result Value Ref Range   pH, Arterial 7.07 (LL) 7.35 - 7.45    Comment: REPEATED TO VERIFY CRITICAL RESULT CALLED TO, READ BACK BY AND VERIFIED WITH: KASEY STELLE, RT ON [id number] AT 1400 BY SWEETSELL C[employer]ODIO    pCO2 arterial NOT CALCULATED 32 - 48 mmHg   pO2, Arterial 69 (L) 83 - 108 mmHg   Bicarbonate NOT CALCULATED 20.0 - 28.0 mmol/L   Acid-Base Excess NOT CALCULATED 0.0 - 2.0 mmol/L   O2 Saturation 92.4 %   Patient temperature 37.8    Collection site RIGHT RADIAL    Drawn by MARGARIE HARMAN Scales test (pass/fail) PASS PASS    Comment: Performed at [facility], [address] [place], [name] [address]  Glucose, capillary     Status: Abnormal   Collection Time: 02/25/24  1:43 PM  Result Value Ref Range    Glucose-Capillary 201 (H) 70 - 99 mg/dL    Comment: Glucose reference  range applies only to samples taken after fasting for at least 8 hours.  Culture, Respiratory w Gram Stain     Status: None (Preliminary result)   Collection Time: 02/25/24  2:54 PM   Specimen: Tracheal Aspirate; Respiratory  Result Value Ref Range   Specimen Description TRACHEAL ASPIRATE    Special Requests NONE    Gram Stain      ABUNDANT WBC PRESENT, PREDOMINANTLY PMN ABUNDANT GRAM NEGATIVE RODS FEW YEAST WITH PSEUDOHYPHAE    Culture      CULTURE REINCUBATED FOR BETTER GROWTH Performed at [facility], [address], [place], [name] [address]    Report Status PENDING   Glucose, capillary     Status: Abnormal   Collection Time: 02/25/24  3:29 PM  Result Value Ref Range   Glucose-Capillary 197 (H) 70 - 99 mg/dL    Comment: Glucose reference range applies only to samples taken after fasting for at least 8 hours.  Basic metabolic panel     Status: Abnormal   Collection Time: 02/25/24  4:39 PM  Result Value Ref Range   Sodium 136 135 - 145 mmol/L   Potassium 4.5 3.5 - 5.1 mmol/L   Chloride 96 (L) 98 - 111 mmol/L   CO2 33 (H) 22 - 32 mmol/L   Glucose, Bld 246 (H) 70 - 99 mg/dL    Comment: Glucose reference range applies only to samples taken after fasting for at least 8 hours.   BUN 26 (H) 6 - 20 mg/dL   Creatinine, Ser 9.41 (L) 0.61 - 1.24 mg/dL   Calcium 8.7 (L) 8.9 - 10.3 mg/dL   GFR, Estimated >39 >39 mL/min    Comment: (NOTE) Calculated using the CKD-EPI Creatinine Equation (2021)    Anion gap 7 5 - 15    Comment: Performed at [facility], [address] [place], [name] [address]  Magnesium     Status: Abnormal   Collection Time: 02/25/24  4:39 PM  Result Value Ref Range   Magnesium 2.5 (H) 1.7 - 2.4 mg/dL    Comment: Performed at [facility], [address], [place], [name] [address]  I-STAT 7, (LYTES, BLD GAS, ICA, H+H)     Status: Abnormal   Collection Time: 02/25/24   4:44 PM  Result Value Ref Range   pH, Arterial 7.425 7.35 - 7.45   pCO2 arterial 55.7 (H) 32 - 48 mmHg   pO2, Arterial 373 (H) 83 - 108 mmHg   Bicarbonate 36.3 (H) 20.0 - 28.0 mmol/L   TCO2 38 (H) 22 - 32 mmol/L   O2 Saturation 100 %   Acid-Base Excess 11.0 (H) 0.0 - 2.0 mmol/L   Sodium 136 135 - 145 mmol/L   Potassium 4.9 3.5 - 5.1 mmol/L   Calcium, Ion 1.19 1.15 - 1.40 mmol/L   HCT 26.0 (L) 39.0 - 52.0 %   Hemoglobin 8.8 (L) 13.0 - 17.0 g/dL   Patient temperature 899.9 F    Collection site RADIAL, ALLEN'S TEST ACCEPTABLE    Drawn by RT    Sample type ARTERIAL   Glucose, capillary     Status: Abnormal   Collection Time: 02/25/24  7:45 PM  Result Value Ref Range   Glucose-Capillary 159 (H) 70 - 99 mg/dL    Comment: Glucose reference range applies only to samples taken after fasting for at least 8 hours.  Glucose, capillary     Status: Abnormal   Collection Time: 02/25/24 11:34 PM  Result  Value Ref Range   Glucose-Capillary 55 (L) 70 - 99 mg/dL    Comment: Glucose reference range applies only to samples taken after fasting for at least 8 hours.  Glucose, capillary     Status: Abnormal   Collection Time: 02/26/24 12:07 AM  Result Value Ref Range   Glucose-Capillary 115 (H) 70 - 99 mg/dL    Comment: Glucose reference range applies only to samples taken after fasting for at least 8 hours.  Glucose, capillary     Status: None   Collection Time: 02/26/24  3:31 AM  Result Value Ref Range   Glucose-Capillary 98 70 - 99 mg/dL    Comment: Glucose reference range applies only to samples taken after fasting for at least 8 hours.  CBC     Status: Abnormal   Collection Time: 02/26/24  5:00 AM  Result Value Ref Range   WBC 17.1 (H) 4.0 - 10.5 K/uL   RBC 2.77 (L) 4.22 - 5.81 MIL/uL   Hemoglobin 7.9 (L) 13.0 - 17.0 g/dL   HCT 72.9 (L) 60.9 - 47.9 %   MCV 97.5 80.0 - 100.0 fL   MCH 28.5 26.0 - 34.0 pg   MCHC 29.3 (L) 30.0 - 36.0 g/dL   RDW 84.3 (H) 88.4 - 84.4 %   Platelets 498 (H) 150  - 400 K/uL   nRBC 0.3 (H) 0.0 - 0.2 %    Comment: Performed at [facility], [address], [place], [name] [address]  Comprehensive metabolic panel     Status: Abnormal   Collection Time: 02/26/24  5:00 AM  Result Value Ref Range   Sodium 138 135 - 145 mmol/L   Potassium 3.6 3.5 - 5.1 mmol/L   Chloride 99 98 - 111 mmol/L   CO2 32 22 - 32 mmol/L   Glucose, Bld 96 70 - 99 mg/dL    Comment: Glucose reference range applies only to samples taken after fasting for at least 8 hours.   BUN 22 (H) 6 - 20 mg/dL   Creatinine, Ser 9.45 (L) 0.61 - 1.24 mg/dL   Calcium 9.2 8.9 - 89.6 mg/dL   Total Protein 6.2 (L) 6.5 - 8.1 g/dL   Albumin  2.3 (L) 3.5 - 5.0 g/dL   AST 37 15 - 41 U/L   ALT 82 (H) 0 - 44 U/L   Alkaline Phosphatase 58 38 - 126 U/L   Total Bilirubin 0.8 0.0 - 1.2 mg/dL   GFR, Estimated >39 >39 mL/min    Comment: (NOTE) Calculated using the CKD-EPI Creatinine Equation (2021)    Anion gap 7 5 - 15    Comment: Performed at [facility], [address], [place], [name] [address]  Glucose, capillary     Status: None   Collection Time: 02/26/24  7:42 AM  Result Value Ref Range   Glucose-Capillary 99 70 - 99 mg/dL    Comment: Glucose reference range applies only to samples taken after fasting for at least 8 hours.  Glucose, capillary     Status: Abnormal   Collection Time: 02/26/24 12:03 PM  Result Value Ref Range   Glucose-Capillary 116 (H) 70 - 99 mg/dL    Comment: Glucose reference range applies only to samples taken after fasting for at least 8 hours.  Glucose, capillary     Status: Abnormal   Collection Time: 02/26/24  4:08 PM  Result Value Ref Range   Glucose-Capillary 128 (H) 70 - 99 mg/dL    Comment: Glucose reference  range applies only to samples taken after fasting for at least 8 hours.    US  EKG SITE RITE Result Date: 02/25/2024 If Site Rite image not attached, placement could not be confirmed due to current cardiac rhythm.  DG Chest Port 1 View Result  Date: 02/25/2024 CLINICAL DATA:  Status post intubation. EXAM: PORTABLE CHEST 1 VIEW COMPARISON:  02/24/2024. FINDINGS: Endotracheal tube tip is located 1 cm above the carina, recommend retraction by approximately 2.5 cm. Low lung volumes. Stable cardiomegaly. Retrocardiac airspace opacity, similar to slightly increased since the prior exam. The right lung appears clear. No sizable pleural effusion or pneumothorax. Visualized osseous structures are unchanged. IMPRESSION: 1. Endotracheal tube tip is located 1 cm above the carina, recommend retraction by approximately 2.5 cm. 2. Similar to slightly increased retrocardiac airspace opacity, which could reflect atelectasis or infiltrate. 3. Stable cardiomegaly. Electronically Signed   By: Harrietta Sherry M.D.   On: 02/25/2024 15:21    MND:[id number] to obtain given mental status and intubation  Blood pressure (!) 125/105, pulse (!) 107, temperature 98.4 F (36.9 C), resp. rate (!) 24, height 5' 8 (1.727 m), weight 102 kg, SpO2 99%.  PHYSICAL EXAM:  CONSTITUTIONAL: well developed CARDIOVASCULAR: normal rate PULMONARY/CHEST WALL: intubated, ETT In place, no significant secretions currently HENT: Head : normocephalic Nose: nose without purulence Mouth/Throat:  Mouth: uvula midline and no oral lesions Throat: oropharynx moist EYES: conjunctiva normal, PERRL NECK: noted prior tracheostomy incision with occlusive dressing place; landmarks palpable, neck extension modest  Studies Reviewed: Prior extensive ICU notes reviewed and most recent labs reviewed CT Neck 10/28/2023 reviewed and interpreted: innominate does not seem extremely high; appears to have relatively normal neck anatomy  Assessment/Plan: 48 y.o. with:  Chronic hypoxic respiratory failure 2.   H/o tracheostomy  ENT engaged for trachesotomy. Currently FiO2 40%, PEEP 5. Based on conversations with ICU team, decannulation possibility unlikely. As such, given likely need for long-term  ventilation, will plan for tracheostomy - likely next week - Please re-engage ENT if decision changes. Appreciate ICU conversation with mother re: tracheostomy - Recommend ideally FiO2 40%, PEEP 5-8 (mimimal vent settings) from tracheostomy candidacy standpoint  Eldora KATHEE Blanch  Time spent face to face and for chart review: 58 mins MDM: mod (chronic problem, exacerbation; decision for surgery, independent interpretation of imaging, review of notes, labs)  02/26/2024, 5:35 PM

## 2024-02-27 DIAGNOSIS — I10 Essential (primary) hypertension: Secondary | ICD-10-CM | POA: Diagnosis not present

## 2024-02-27 DIAGNOSIS — I952 Hypotension due to drugs: Secondary | ICD-10-CM | POA: Diagnosis not present

## 2024-02-27 DIAGNOSIS — J690 Pneumonitis due to inhalation of food and vomit: Secondary | ICD-10-CM | POA: Diagnosis not present

## 2024-02-27 DIAGNOSIS — J9601 Acute respiratory failure with hypoxia: Secondary | ICD-10-CM | POA: Diagnosis not present

## 2024-02-27 DIAGNOSIS — D509 Iron deficiency anemia, unspecified: Secondary | ICD-10-CM | POA: Diagnosis not present

## 2024-02-27 DIAGNOSIS — E440 Moderate protein-calorie malnutrition: Secondary | ICD-10-CM | POA: Diagnosis not present

## 2024-02-27 DIAGNOSIS — R1310 Dysphagia, unspecified: Secondary | ICD-10-CM | POA: Diagnosis not present

## 2024-02-27 DIAGNOSIS — I613 Nontraumatic intracerebral hemorrhage in brain stem: Secondary | ICD-10-CM | POA: Diagnosis not present

## 2024-02-27 DIAGNOSIS — J150 Pneumonia due to Klebsiella pneumoniae: Secondary | ICD-10-CM | POA: Diagnosis not present

## 2024-02-27 LAB — COMPREHENSIVE METABOLIC PANEL WITH GFR
ALT: 70 U/L — ABNORMAL HIGH (ref 0–44)
AST: 30 U/L (ref 15–41)
Albumin: 2.4 g/dL — ABNORMAL LOW (ref 3.5–5.0)
Alkaline Phosphatase: 55 U/L (ref 38–126)
Anion gap: 9 (ref 5–15)
BUN: 23 mg/dL — ABNORMAL HIGH (ref 6–20)
CO2: 29 mmol/L (ref 22–32)
Calcium: 9 mg/dL (ref 8.9–10.3)
Chloride: 100 mmol/L (ref 98–111)
Creatinine, Ser: 0.56 mg/dL — ABNORMAL LOW (ref 0.61–1.24)
GFR, Estimated: >60 mL/min (ref >60)
Glucose, Bld: 129 mg/dL — ABNORMAL HIGH (ref 70–99)
Potassium: 3.8 mmol/L (ref 3.5–5.1)
Sodium: 138 mmol/L (ref 135–145)
Total Bilirubin: 0.4 mg/dL (ref 0.0–1.2)
Total Protein: 6.3 g/dL — ABNORMAL LOW (ref 6.5–8.1)

## 2024-02-27 LAB — CBC
HCT: 25.7 % — ABNORMAL LOW (ref 39.0–52.0)
Hemoglobin: 7.8 g/dL — ABNORMAL LOW (ref 13.0–17.0)
MCH: 28.4 pg (ref 26.0–34.0)
MCHC: 30.4 g/dL (ref 30.0–36.0)
MCV: 93.5 fL (ref 80.0–100.0)
Platelets: 495 K/uL — ABNORMAL HIGH (ref 150–400)
RBC: 2.75 MIL/uL — ABNORMAL LOW (ref 4.22–5.81)
RDW: 17 % — ABNORMAL HIGH (ref 11.5–15.5)
WBC: 11.3 K/uL — ABNORMAL HIGH (ref 4.0–10.5)
nRBC: 0.5 % — ABNORMAL HIGH (ref 0.0–0.2)

## 2024-02-27 LAB — GLUCOSE, CAPILLARY
Glucose-Capillary: 115 mg/dL — ABNORMAL HIGH (ref 70–99)
Glucose-Capillary: 133 mg/dL — ABNORMAL HIGH (ref 70–99)
Glucose-Capillary: 135 mg/dL — ABNORMAL HIGH (ref 70–99)
Glucose-Capillary: 138 mg/dL — ABNORMAL HIGH (ref 70–99)
Glucose-Capillary: 141 mg/dL — ABNORMAL HIGH (ref 70–99)
Glucose-Capillary: 154 mg/dL — ABNORMAL HIGH (ref 70–99)

## 2024-02-27 MED ORDER — TRAZODONE HCL 50 MG PO TABS
50.0000 mg | ORAL_TABLET | Freq: Every evening | ORAL | Status: DC | PRN
  Administered 2024-03-09 – 2024-06-18 (×33): 50 mg
  Filled 2024-02-27 (×32): qty 1

## 2024-02-27 MED ORDER — ARTIFICIAL TEARS OPHTHALMIC OINT
TOPICAL_OINTMENT | Freq: Three times a day (TID) | OPHTHALMIC | Status: DC
  Administered 2024-02-27 – 2024-03-31 (×26): 1 via OPHTHALMIC
  Filled 2024-02-27 (×12): qty 3.5

## 2024-02-27 NOTE — Progress Notes (Signed)
 Occupational Therapy Treatment Patient Details Name: Garrett [name] MRN: [id number] DOB: [birth year] Today's Date: 02/27/2024   History of present illness Pt is a 48 yo male who was found down 10/27/23 with agonal breathing. 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 Intubated 3/8, cortrak placed 3/14, trach placed 3/22, PEG placed 3/24. De-cannulated 6/10. Pt re-intubed on 02/25/2024. PMH: HTN, smoker   OT comments  Pt demonstrates increased time to respond with initiation of the L UE and attempts to visualize in the L lower quadrant. Pt does not blink to to threat in the R eye. Pt noted to have vertical bobbing of the eye with minimal visual control. Pt does release roll of tape on command x2 during session. Recommendation for skilled inpatient follow up therapy, <3 hours/day with vent care.  Call me=  D      If plan is discharge home, recommend the following:  Other (comment) ((A) for all care)   Equipment Recommendations  Wheelchair (measurements OT);Wheelchair cushion (measurements OT);Hospital bed;Hoyer lift (pressure relief chair cushion)    Recommendations for Other Services Speech consult    Precautions / Restrictions Precautions Precautions: Fall Recall of Precautions/Restrictions: Impaired Precaution/Restrictions Comments: PEG, SBP < 160 Required Braces or Orthoses: Other Brace Other Brace: resting hand splints present in room, not on during session Restrictions Weight Bearing Restrictions Per Provider Order: No       Mobility Bed Mobility Overal bed mobility: Needs Assistance Bed Mobility: Supine to Sit, Sit to Supine     Supine to sit: Total assist, +2 for physical assistance Sit to supine: Total assist, +2 for physical assistance        Transfers                   General transfer  comment: not appropriate at this time     Balance Overall balance assessment: Needs assistance Sitting-balance support: Feet supported, No upper extremity supported Sitting balance-Leahy Scale: Zero Sitting balance - Comments: totalA for sitting balance and head control Postural control: Posterior lean                                 ADL either performed or assessed with clinical judgement   ADL                                         General ADL Comments: total (A)    Extremity/Trunk Assessment Upper Extremity Assessment Upper Extremity Assessment: Generalized weakness;Right hand dominant;RUE deficits/detail;LUE deficits/detail RUE Deficits / Details: edema noted to have some contracture at the MCP with hooking hand position when in neutral position RUE Coordination: decreased fine motor;decreased gross motor LUE Deficits / Details: decreased grasp strength, pt with activation and intiation noted. pt demonstrates some internal supination movement to help with release of object in hand. no subluxation and no scapula activation noted LUE Coordination: decreased fine motor;decreased gross motor   Lower Extremity Assessment Lower Extremity Assessment: Defer to PT evaluation RLE Deficits / Details: no clonus noted LLE Deficits / Details: no clonous noted        Vision   Additional Comments: pt with bobbing eye movement but with mild improvement in sitting during therapy session. pt still has  the movement but with some sustained lower quadrant fixation noted. Caution against any taping of R eye as its one of the motor responses he can control at times.   Perception     Praxis     Communication Communication Communication: Impaired Factors Affecting Communication: Difficulty expressing self;Trach/intubated   Cognition Arousal: Alert Behavior During Therapy: Flat affect Cognition: Difficult to assess Difficult to assess due to: Intubated            OT - Cognition Comments: pt attempting to follow command with L Ue see below - motor planning affecting ability to respond                 Following commands: Impaired Following commands impaired: Follows one step commands with increased time, Follows one step commands inconsistently (inconsistently following commands, does follow commands with significant increase in time to attempt to grab and later drop and object with L hand)      Cueing   Cueing Techniques: Verbal cues, Tactile cues, Visual cues  Exercises Other Exercises Other Exercises: PROM ankle, bil UE digits into extesion, wrist extension elbow flexion / extension and shoulder flexion    Shoulder Instructions       General Comments full pressure vent support Peep 5 40% FIO2, foley    Pertinent Vitals/ Pain       Pain Assessment Pain Assessment: CPOT Facial Expression: Relaxed, neutral Body Movements: Absence of movements Muscle Tension: Relaxed Compliance with ventilator (intubated pts.): Tolerating ventilator or movement Vocalization (extubated pts.): N/A CPOT Total: 0  Home Living                                          Prior Functioning/Environment              Frequency  Min 1X/week        Progress Toward Goals  OT Goals(current goals can now be found in the care plan section)  Progress towards OT goals: Not progressing toward goals - comment  Acute Rehab OT Goals Patient Stated Goal: no family present OT Goal Formulation: Patient unable to participate in goal setting Time For Goal Achievement: 03/12/24 Potential to Achieve Goals: Fair ADL Goals Pt/caregiver will Perform Home Exercise Program: Increased ROM;Both right and left upper extremity Additional ADL Goal #1: Pt will follow 1 step commands with 50% accuracy in a non distracting environment. Additional ADL Goal #2: Pt will complete bed mobility with max assist +2, maintain sitting balance statically for 3  minutes with mod assist. Additional ADL Goal #3: Pt will use ball squeeze in L hand for accurate yes answer on 5/5 attempts.  Plan      Co-evaluation    PT/OT/SLP Co-Evaluation/Treatment: Yes Reason for Co-Treatment: Complexity of the patient's impairments (multi-system involvement);Necessary to address cognition/behavior during functional activity;For patient/therapist safety PT goals addressed during session: Mobility/safety with mobility;Balance;Strengthening/ROM OT goals addressed during session: Strengthening/ROM      AM-PAC OT 6 Clicks Daily Activity     Outcome Measure   Help from another person eating meals?: Total Help from another person taking care of personal grooming?: Total Help from another person toileting, which includes using toliet, bedpan, or urinal?: Total Help from another person bathing (including washing, rinsing, drying)?: Total Help from another person to put on and taking off regular upper body clothing?: Total Help from another person to put on and taking off regular  lower body clothing?: Total 6 Click Score: 6    End of Session Equipment Utilized During Treatment: Oxygen  OT Visit Diagnosis: Unsteadiness on feet (R26.81);Muscle weakness (generalized) (M62.81)   Activity Tolerance Patient tolerated treatment well   Patient Left in bed;with call bell/phone within reach;with nursing/sitter in room;with SCD's reapplied   Nurse Communication Mobility status;Precautions;Need for lift equipment        Time: 8990-8967 OT Time Calculation (min): 23 min  Charges: OT General Charges $OT Visit: 1 Visit OT Treatments $Self Care/Home Management : 8-22 mins   Brynn, OTR/L  Acute Rehabilitation Services Office: [phone] .   Ely Molt 02/27/2024, 1:54 PM

## 2024-02-27 NOTE — Progress Notes (Signed)
 Physical Therapy Treatment Patient Details Name: Garrett [name] MRN: [id number] DOB: 1975-12-29 Today's Date: 02/27/2024   History of Present Illness Pt is a 48 yo male who was found down 10/27/23 with agonal breathing. 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 Intubated 3/8, cortrak placed 3/14, trach placed 3/22, PEG placed 3/24. De-cannulated 6/10. Pt re-intubed on 02/25/2024. PMH: HTN, smoker    PT Comments  Pt seen after being intubated again on 02/25/2024. Pt continues to require totalA for all functional mobility tasks. Pt continues to demonstrate gaze instability with only vertical eye movement noted. Gaze instability does appear to decrease with cues for motor commands with use of LUE to grasp an object. Pt is able to demonstrate the ability to produce movement of L hand/wrist and forearm during session to grasp a tape roll and then later pronate wrist in attempts to drop the tape roll. PT also notes flickers of activation of muscles in RUE with PROM, although it is difficult to discern if these are purposeful, spontaneous or reflexive. Pt appear to be functioning similarly to PT notes prior to intubation. PT plan of care extended, goals remain appropriate. Patient will benefit from continued inpatient follow up therapy, <3 hours/day.   If plan is discharge home, recommend the following: Two people to help with walking and/or transfers;Two people to help with bathing/dressing/bathroom;Assistance with cooking/housework;Assistance with feeding;Direct supervision/assist for medications management;Direct supervision/assist for financial management;Assist for transportation;Help with stairs or ramp for entrance;Supervision due to cognitive status   Can travel by private vehicle        Equipment Recommendations  Bauxite lift;Hospital  bed;Wheelchair (measurements PT);Wheelchair cushion (measurements PT);BSC/3in1;Other (comment) (pt would potentially need a custom power wheelchair with either gaze control if consistency of gaze improves, or that can be controlled via switch technology)    Recommendations for Other Services       Precautions / Restrictions Precautions Precautions: Fall Recall of Precautions/Restrictions: Impaired Precaution/Restrictions Comments: PEG, SBP < 160 Required Braces or Orthoses: Other Brace Other Brace: resting hand splints present in room, not on during session Restrictions Weight Bearing Restrictions Per Provider Order: No     Mobility  Bed Mobility Overal bed mobility: Needs Assistance Bed Mobility: Supine to Sit, Sit to Supine     Supine to sit: Total assist, +2 for physical assistance Sit to supine: Total assist, +2 for physical assistance        Transfers                        Ambulation/Gait                   Stairs             Wheelchair Mobility     Tilt Bed    Modified Rankin (Stroke Patients Only) Modified Rankin (Stroke Patients Only) Pre-Morbid Rankin Score: No symptoms Modified Rankin: Severe disability     Balance Overall balance assessment: Needs assistance Sitting-balance support: Feet supported, No upper extremity supported Sitting balance-Leahy Scale: Zero Sitting balance - Comments: totalA for sitting balance and head control Postural control: Posterior lean                                  Communication Communication Communication: Impaired Factors Affecting Communication: Difficulty expressing  self;Trach/intubated  Cognition Arousal: Alert Behavior During Therapy: Flat affect   PT - Cognitive impairments: Difficult to assess Difficult to assess due to: Impaired communication                     PT - Cognition Comments: pt with R eye open throughout session, moves eye vertically but not  horizontally. Pt with instability of gaze due to rapid eye movements throughout most of session, although these rapid eye movements do seem to decrease in intensity and frequency with pt attempts to fixate gaze on objects and to follow motor commands Following commands: Impaired Following commands impaired: Follows one step commands with increased time, Follows one step commands inconsistently (inconsistently following commands, does follow commands with significant increase in time to attempt to grab and later drop and object with L hand)    Cueing Cueing Techniques: Verbal cues, Tactile cues, Visual cues  Exercises      General Comments General comments (skin integrity, edema, etc.): pt is intubated, 40% FiO2, 5 PEEP, full support      Pertinent Vitals/Pain Pain Assessment Pain Assessment: CPOT Facial Expression: Relaxed, neutral Body Movements: Absence of movements Muscle Tension: Relaxed Compliance with ventilator (intubated pts.): Tolerating ventilator or movement Vocalization (extubated pts.): N/A CPOT Total: 0    Home Living                          Prior Function            PT Goals (current goals can now be found in the care plan section) Acute Rehab PT Goals Patient Stated Goal: unable to state, no visitors present PT Goal Formulation: Patient unable to participate in goal setting Time For Goal Achievement: 03/26/24 Potential to Achieve Goals: Poor Progress towards PT goals: Not progressing toward goals - comment (pt remains limited by poor motor function)    Frequency    Min 1X/week      PT Plan      Co-evaluation PT/OT/SLP Co-Evaluation/Treatment: Yes Reason for Co-Treatment: Complexity of the patient's impairments (multi-system involvement);Necessary to address cognition/behavior during functional activity;For patient/therapist safety PT goals addressed during session: Mobility/safety with mobility;Balance;Strengthening/ROM        AM-PAC  PT 6 Clicks Mobility   Outcome Measure  Help needed turning from your back to your side while in a flat bed without using bedrails?: Total Help needed moving from lying on your back to sitting on the side of a flat bed without using bedrails?: Total Help needed moving to and from a bed to a chair (including a wheelchair)?: Total Help needed standing up from a chair using your arms (e.g., wheelchair or bedside chair)?: Total Help needed to walk in hospital room?: Total Help needed climbing 3-5 steps with a railing? : Total 6 Click Score: 6    End of Session Equipment Utilized During Treatment: Oxygen Activity Tolerance: Patient tolerated treatment well Patient left: in bed;with call bell/phone within reach;with nursing/sitter in room Nurse Communication: Mobility status;Need for lift equipment PT Visit Diagnosis: Muscle weakness (generalized) (M62.81);Other symptoms and signs involving the nervous system (R29.898);Hemiplegia and hemiparesis Hemiplegia - Right/Left: Right Hemiplegia - dominant/non-dominant: Dominant Hemiplegia - caused by: Nontraumatic intracerebral hemorrhage     Time: 8990-8967 PT Time Calculation (min) (ACUTE ONLY): 23 min  Charges:    $Therapeutic Activity: 8-22 mins PT General Charges $$ ACUTE PT VISIT: 1 Visit  [name], PT, DPT Acute Rehabilitation Office [phone]    [name] 02/27/2024, 1:37 PM

## 2024-02-27 NOTE — Progress Notes (Signed)
 NAME:  Garrett [name], MRN:  [id number], DOB:  [birth year], LOS: 123 ADMISSION DATE:  10/27/2023, CONSULTATION DATE:  10/27/23 REFERRING MD:  Dr Madelyne, CHIEF COMPLAINT:  Found down   History of Present Illness:  48 y/o male with past medical history of hypertension who was found down for unknown downtime by his fiance when she came home from work. Reportedly, having agonal breathing.  He apparently had driven her to work this morning.  He has HTN but never checks his BP and does not follow with MD.  She says you can tell when his BP is really high; his eyes get blood shot and he is sweating.  No h/o seizures.  Besides almost daily [name] and cigarette smoking, she is not aware of any other drugs. Drug screen positive for opioids and he was given several doses of Narcan . Patient found to have:  1. 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 Basal cisterns and fourth ventricle are effaced without hydrocephalus at this time. 2. Trace additional subarachnoid hemorrhage along the left parietal convexity. BP in ED 262/156. His CXR revealed a RUL and perihilar infiltrates suggesting aspiration pneumonia. ED labs also revealed PH 7.169, LA 4.4, CPK 985. Patient was intubated in ED.  Pertinent  Medical History  Hypertension  Significant Hospital Events: Including procedures, antibiotic start and stop dates in addition to other pertinent events   3/8: Intubated in ED, pontine bleed/SAH. CT head 17:09 Acute large hemorrhage 4.1 cm in pons with intraventricular extension without hydrocephalus 3/9: CTA 03:14 AM No change in IPH in pons, similar biparietal [facility] 3/14: Now awake, appears locked in can follow commands with eyes. 3/16 Purposeful with left upper extremity  3/22 tracheostomy placed at bedside, bleeding issues overnight from trach site 3/24 PEG (Dr. Sebastian) 3/24 sputum  culture with MRSA 3/27 vomited. TF on hold. Abd film c/w ileus. Added reglan , got SSE. Did tolerate PSV all day  3/28 BMs x2 after SSE day prior. Added back TFs at 1/2 rated. Tolerated PS 3/29 Tolerated PS  3/31 Tolerating CPAP PS 15/5, TF on hold due to ileus. 4/1: con't to hold tube feeds today, restarting vancomycin  and sending tracheal aspirate as peaks are 37, plat 24, driving 19. Thick secretions. Fever overnight.  4/2: peaks improved. Ongoing hiccups. Trickle feeding. Neuro exam unchanged.  4/20: trach was changed to cuffless #8 4/28: not safe to swallow. Limited communication and he follows simple commands. On TF 5/1: on TC 28%, with large secretions. Working with speech and he is improving to initiate PMV trials under ST supervision   5/5: on TC 30%, 7 L/min. Trach #6 cuffless, changed 5/1 to facilitate PMV trials  5/12: on TC 21%, 6 L/min. Trach #6 cuffless, unable to produce sounds or speak on PMV. No significant resp secretions. On PEG TF 01/29/2024 tracheostomy was removed due to failure to well provide adequate airway 02/15/2024 pulmonary critical care consult. 7/7 patient obtunded and desaturating on NRB; transfer to icu for intubation; updated mother over phone who wants to reverse code status to full code; ent consulted 7/9 Remains intubated on no continuous sedation, right eye open but does not track movement   Interim History / Subjective:  Remains intubated with right eye open but does not track movement   Objective    Blood pressure (!) 128/101, pulse (!) 112, temperature 99.1 F (37.3 C), temperature source Axillary, resp. rate (!) 24, height 5'  8 (1.727 m), weight 91 kg, SpO2 99%.    Vent Mode: PRVC FiO2 (%):  [40 %] 40 % Set Rate:  [24 bmp] 24 bmp Vt Set:  [540 mL] 540 mL PEEP:  [5 cmH20] 5 cmH20 Plateau Pressure:  [21 cmH20-24 cmH20] 22 cmH20   Intake/Output Summary (Last 24 hours) at 02/27/2024 0659 Last data filed at 02/27/2024 0600 Gross per 24 hour  Intake  1407.75 ml  Output 1325 ml  Net 82.75 ml   Filed Weights   02/25/24 1518 02/26/24 0500 02/27/24 0423  Weight: 93 kg 102 kg 91 kg    Examination: General: Acute on chronically ill appearing adult male lying in bed on mechanical ventilation, in NAD HEENT: ETT, MM pink/moist, PERRL,  Neuro: Eyes spontaneously open but does not track movement, withdrawals to pain in bilateral LE CV: s1s2 regular rate and rhythm, no murmur, rubs, or gallops,  PULM:  Clear to auscultation, no increased work of breathing, no added breath sounds, tolerating vent  GI: soft, bowel sounds active in all 4 quadrants, non-tender, non-distended, tolerating TF Extremities: warm/dry, no edema  Skin: no rashes or lesions  Resolved problem list  Post sedation hypotension  Assessment and Plan   Acute hypoxic respiratory failure due to aspiration pneumonia  -s/p tracheostomy placement 3/22 decannulated on 6/10 due to inability to pass suction catheter; reintubated on 7/7 MRSA and Klebsiella PNA  -Received 7 day course of vancomycin . Overnight 3/31 with ongoing fever. 4/1 exam with peaks 37, plat 24, driving 19. Thick secretions. Added vanc. 4/2 with improved pressures.  -6/28 Cefepime  and vancomycin  added  P: Continue ventilator support with lung protective strategies  Wean PEEP and FiO2 for sats greater than 90%. Head of bed elevated 30 degrees. Plateau pressures less than 30 cm H20.  Follow intermittent chest x-ray and ABG.   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 CT head code stroke with large 4.1cm hemorrhage in pons with IV extension and extension into basal cisterns. Worsening mental status/posturing on 3/29 however  CT head stable.  P: Minimize sedation as able  Maintain neuro protective measures Nutrition and bowel regiment  Aspirations precautions   Complicated E. Coli UTI -needs foley exchange on 03/01/2024 P: Antibiotics as above  Will assess possible need for establishment of suprapubic cath   Moderate protein calorie malnutrition Dysphagia P: Continue tube feeds via PEG  RD following   HTN P: Continue Norvasc  and Lopressor     Elevated LFTs - improved  P: Intermittently trend  Avoid hepatotoxins    Left eye conjunctivitis P: Continue Polytrim  drops for 7 days total  Continue lacrilube to both eyes tp avoid dryness   Hyperglycemia P: Continue SSI  Check CBG q4  Macrocytic anemia  Thrombocytosis  -No report of chronic alcohol  use. B12, folate and TSH normal P: Trend CBC    Poor Dentition -Upper right teeth and one left lower tooth have chipped off. P: Dental evaluation eventually   Best Practice (right click and Reselect all SmartList Selections daily)   Diet/type: NPO w/ meds via tube DVT prophylaxis: LMWH GI prophylaxis: H2B Lines: N/A Foley:  Yes, and it is still needed Code Status:  full code Last date of multidisciplinary goals of  care discussion [7/8 attempted to call mother [place] over phone went straight to voicemail.]  CRITICAL CARE Performed by: Kerstyn Coryell D. Harris   Total critical care time: 38 minutes  Critical care time was exclusive of separately billable procedures and treating other patients.  Critical care was necessary to treat or prevent imminent or life-threatening deterioration.  Critical care was time spent personally by me on the following activities: development of treatment plan with patient and/or surrogate as well as nursing, discussions with consultants, evaluation of patient's response to treatment, examination of patient, obtaining history from patient or surrogate, ordering and performing treatments and interventions, ordering and  review of laboratory studies, ordering and review of radiographic studies, pulse oximetry and re-evaluation of patient's condition.  Kensley Lares D. Harris, NP-C [facility] Personal contact information can be found on Amion  If no contact or response made please call [phone] 02/27/2024, 7:06 AM

## 2024-02-27 NOTE — Plan of Care (Signed)
  Problem: Nutrition: Goal: Risk of aspiration will decrease Outcome: Progressing Goal: Dietary intake will improve Outcome: Progressing   

## 2024-02-27 NOTE — Progress Notes (Signed)
 Orthopedic Tech Progress Note Patient Details:  Garrett [name] March 29, 1976 [id number]  Ortho Devices Type of Ortho Device: Prafo boot/shoe Ortho Device/Splint Location: BLE Ortho Device/Splint Interventions: Application   Post Interventions Patient Tolerated: Well  Massie BRAVO Garrett [name] 02/27/2024, 2:44 PM

## 2024-02-28 ENCOUNTER — Inpatient Hospital Stay (HOSPITAL_COMMUNITY)

## 2024-02-28 DIAGNOSIS — J9601 Acute respiratory failure with hypoxia: Secondary | ICD-10-CM | POA: Diagnosis not present

## 2024-02-28 DIAGNOSIS — I613 Nontraumatic intracerebral hemorrhage in brain stem: Secondary | ICD-10-CM | POA: Diagnosis not present

## 2024-02-28 DIAGNOSIS — Z7189 Other specified counseling: Secondary | ICD-10-CM | POA: Diagnosis not present

## 2024-02-28 DIAGNOSIS — R1310 Dysphagia, unspecified: Secondary | ICD-10-CM | POA: Diagnosis not present

## 2024-02-28 DIAGNOSIS — J9621 Acute and chronic respiratory failure with hypoxia: Secondary | ICD-10-CM | POA: Diagnosis not present

## 2024-02-28 DIAGNOSIS — J690 Pneumonitis due to inhalation of food and vomit: Secondary | ICD-10-CM | POA: Diagnosis not present

## 2024-02-28 DIAGNOSIS — I10 Essential (primary) hypertension: Secondary | ICD-10-CM | POA: Diagnosis not present

## 2024-02-28 DIAGNOSIS — J150 Pneumonia due to Klebsiella pneumoniae: Secondary | ICD-10-CM | POA: Diagnosis not present

## 2024-02-28 DIAGNOSIS — Z789 Other specified health status: Secondary | ICD-10-CM | POA: Diagnosis not present

## 2024-02-28 DIAGNOSIS — Z515 Encounter for palliative care: Secondary | ICD-10-CM | POA: Diagnosis not present

## 2024-02-28 DIAGNOSIS — D509 Iron deficiency anemia, unspecified: Secondary | ICD-10-CM | POA: Diagnosis not present

## 2024-02-28 DIAGNOSIS — E440 Moderate protein-calorie malnutrition: Secondary | ICD-10-CM | POA: Diagnosis not present

## 2024-02-28 DIAGNOSIS — I952 Hypotension due to drugs: Secondary | ICD-10-CM | POA: Diagnosis not present

## 2024-02-28 LAB — GLUCOSE, CAPILLARY
Glucose-Capillary: 116 mg/dL — ABNORMAL HIGH (ref 70–99)
Glucose-Capillary: 121 mg/dL — ABNORMAL HIGH (ref 70–99)
Glucose-Capillary: 126 mg/dL — ABNORMAL HIGH (ref 70–99)
Glucose-Capillary: 127 mg/dL — ABNORMAL HIGH (ref 70–99)
Glucose-Capillary: 132 mg/dL — ABNORMAL HIGH (ref 70–99)
Glucose-Capillary: 138 mg/dL — ABNORMAL HIGH (ref 70–99)

## 2024-02-28 MED ORDER — FUROSEMIDE 10 MG/ML IJ SOLN
40.0000 mg | Freq: Once | INTRAMUSCULAR | Status: AC
  Administered 2024-02-28: 40 mg via INTRAVENOUS
  Filled 2024-02-28: qty 4

## 2024-02-28 NOTE — Progress Notes (Signed)
 NAME:  Garrett [name], MRN:  [id number], DOB:  [birth year], LOS: 124 ADMISSION DATE:  10/27/2023, CONSULTATION DATE:  10/27/23 REFERRING MD:  Dr Madelyne, CHIEF COMPLAINT:  Found down   History of Present Illness:  48 y/o male with past medical history of hypertension who was found down for unknown downtime by his fiance when she came home from work. Reportedly, having agonal breathing.  He apparently had driven her to work this morning.  He has HTN but never checks his BP and does not follow with MD.  She says you can tell when his BP is really high; his eyes get blood shot and he is sweating.  No h/o seizures.  Besides almost daily [name] and cigarette smoking, she is not aware of any other drugs. Drug screen positive for opioids and he was given several doses of Narcan . Patient found to have:  1. 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 Basal cisterns and fourth ventricle are effaced without hydrocephalus at this time. 2. Trace additional subarachnoid hemorrhage along the left parietal convexity. BP in ED 262/156. His CXR revealed a RUL and perihilar infiltrates suggesting aspiration pneumonia. ED labs also revealed PH 7.169, LA 4.4, CPK 985. Patient was intubated in ED.  Pertinent  Medical History  Hypertension  Significant Hospital Events: Including procedures, antibiotic start and stop dates in addition to other pertinent events   3/8: Intubated in ED, pontine bleed/SAH. CT head 17:09 Acute large hemorrhage 4.1 cm in pons with intraventricular extension without hydrocephalus 3/9: CTA 03:14 AM No change in IPH in pons, similar biparietal [facility] 3/14: Now awake, appears locked in can follow commands with eyes. 3/16 Purposeful with left upper extremity  3/22 tracheostomy placed at bedside, bleeding issues overnight from trach site 3/24 PEG (Dr. Sebastian) 3/24 sputum  culture with MRSA 3/27 vomited. TF on hold. Abd film c/w ileus. Added reglan , got SSE. Did tolerate PSV all day  3/28 BMs x2 after SSE day prior. Added back TFs at 1/2 rated. Tolerated PS 3/29 Tolerated PS  3/31 Tolerating CPAP PS 15/5, TF on hold due to ileus. 4/1: con't to hold tube feeds today, restarting vancomycin  and sending tracheal aspirate as peaks are 37, plat 24, driving 19. Thick secretions. Fever overnight.  4/2: peaks improved. Ongoing hiccups. Trickle feeding. Neuro exam unchanged.  4/20: trach was changed to cuffless #8 4/28: not safe to swallow. Limited communication and he follows simple commands. On TF 5/1: on TC 28%, with large secretions. Working with speech and he is improving to initiate PMV trials under ST supervision   5/5: on TC 30%, 7 L/min. Trach #6 cuffless, changed 5/1 to facilitate PMV trials  5/12: on TC 21%, 6 L/min. Trach #6 cuffless, unable to produce sounds or speak on PMV. No significant resp secretions. On PEG TF 01/29/2024 tracheostomy was removed due to failure to well provide adequate airway 02/15/2024 pulmonary critical care consult. 7/7 patient obtunded and desaturating on NRB; transfer to icu for intubation; updated mother over phone who wants to reverse code status to full code; ent consulted 7/9 Remains intubated on no continuous sedation, right eye open but does not track movement  7/10 sleeping in bed this morning, no acute issues overnight  Interim History / Subjective:  Sleeping peacefully in bed on ventilator this a.m.  Objective    Blood pressure (!) 120/100, pulse (!) 106, temperature 99.8 F (37.7 C), temperature source Axillary,  resp. rate 20, height 5' 8 (1.727 m), weight 93 kg, SpO2 99%.    Vent Mode: PSV;CPAP FiO2 (%):  [40 %] 40 % Set Rate:  [20 bmp-24 bmp] 20 bmp Vt Set:  [540 mL] 540 mL PEEP:  [5 cmH20] 5 cmH20 Pressure Support:  [8 cmH20] 8 cmH20 Plateau Pressure:  [23 cmH20-26 cmH20] 26 cmH20   Intake/Output Summary (Last  24 hours) at 02/28/2024 0858 Last data filed at 02/28/2024 0800 Gross per 24 hour  Intake 2202.81 ml  Output 1000 ml  Net 1202.81 ml   Filed Weights   02/26/24 0500 02/27/24 0423 02/28/24 0500  Weight: 102 kg 91 kg 93 kg    Examination: General: Chronic ill-appearing deconditioned adult male lying in bed on mechanical ventilation no acute distress HEENT: ETT, MM pink/moist, PERRL,  Neuro: Sleeping well arouses to verbal stimuli CV: s1s2 regular rate and rhythm, no murmur, rubs, or gallops,  PULM: Clear to auscultation laterally, no increased work of breathing, no breath sounds GI: soft, bowel sounds active in all 4 quadrants, non-tender, non-distended, tolerating TF Extremities: warm/dry, no edema  Skin: no rashes or lesions  Resolved problem list  Post sedation hypotension  Assessment and Plan   Acute hypoxic respiratory failure due to aspiration pneumonia  -s/p tracheostomy placement 3/22 decannulated on 6/10 due to inability to pass suction catheter; reintubated on 7/7 MRSA and Klebsiella PNA  -Received 7 day course of vancomycin . Overnight 3/31 with ongoing fever. 4/1 exam with peaks 37, plat 24, driving 19. Thick secretions. Added vanc. 4/2 with improved pressures.  - Completed 7 days of cefepime  and vancomycin  7/9 P: Continue ventilator support with lung protective strategies  Wean PEEP and FiO2 for sats greater than 90%. Head of bed elevated 30 degrees. Plateau pressures less than 30 cm H20.  Follow intermittent chest x-ray and ABG.   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 CT head code stroke with large 4.1cm hemorrhage in pons with IV extension and extension into basal cisterns. Worsening mental status/posturing on  3/29 however CT head stable.  P: Minimize sedation as able Maintain neuroprotective measures Nutrition and bowel regiment Aspiration precautions  Complicated E. Coli UTI -needs foley exchange on 03/01/2024 P: Discussed potential placement of suprapubic catheter with urology 7/9 recommendations made to establish discharge disposition (patient will likely need to remain inpatient upwards of 6 weeks post procedure ) and family agreeance prior to formal consult.  Called mother 7/9 with no answer   Moderate protein calorie malnutrition Dysphagia P: Continue tube feeds via PEG tube RD following  HTN P: Continue Norvasc  and Lopressor   Elevated LFTs - improved  P: Intermittently trend Avoid hepatotoxins  Left eye conjunctivitis P: Continue Polytrim  drops x 7 days Lacri-Lube to bilateral eyes  Hyperglycemia P: Continue SSI CBG checks every 4 CBG goal 140-180  Macrocytic anemia  Thrombocytosis  -No report of chronic alcohol  use. B12, folate and TSH normal P: Trend CBC   Poor Dentition -Upper right teeth and one left lower tooth have chipped off. P: Eventual dental evaluation  Best Practice (right click and Reselect all SmartList Selections daily)   Diet/type: NPO w/ meds via tube DVT prophylaxis: LMWH GI prophylaxis: H2B Lines: N/A Foley:  Yes, and it is still needed Code Status:  full code Last date of multidisciplinary goals of care discussion [7/8 attempted to call mother [place] over phone went straight to voicemail.]  CRITICAL CARE Performed by: Juliya Magill D. Harris   Total critical care time: 37 minutes  Critical care time was exclusive of separately billable procedures and treating other patients.  Critical care was necessary to treat or prevent imminent or life-threatening deterioration.  Critical care was time spent personally by me on the following activities: development of treatment plan with patient and/or surrogate as well as nursing, discussions  with consultants, evaluation of patient's response to treatment, examination of patient, obtaining history from patient or surrogate, ordering and performing treatments and interventions, ordering and review of laboratory studies, ordering and review of radiographic studies, pulse oximetry and re-evaluation of patient's condition.  Valisha Heslin D. Harris, NP-C [facility] Personal contact information can be found on Amion  If no contact or response made please call [phone] 02/28/2024, 8:58 AM

## 2024-02-28 NOTE — TOC Progression Note (Signed)
 Transition of Care [facility]) - Progression Note    Patient Details  Name: Garrett [name] MRN: [id number] Date of Birth: Feb 11, 1976  Transition of Care [facility]) CM/SW Contact  Inocente GORMAN Kindle, LCSW Phone Number: 02/28/2024, 1:41 PM  Clinical Narrative:    Patient has a long length of stay awaiting Disability to be approved prior to any SNFs accepting him. Will continue to follow.    Expected Discharge Plan: Long Term Nursing Home Barriers to Discharge: Continued Medical Work up, SNF Pending Medicaid, SNF Pending bed offer, SNF Pending payor source - LOG, Inadequate or no insurance (New trach)  Expected Discharge Plan and Services In-house Referral: Clinical Social Work, Hospice / Palliative Care   Post Acute Care Choice: Skilled Nursing Facility Living arrangements for the past 2 months: Single Family Home                                       Social Determinants of Health (SDOH) Interventions SDOH Screenings   Food Insecurity: Patient Unable To Answer (10/29/2023)  Social Connections: Unknown (06/23/2023)   Received from [facility]  Tobacco Use: High Risk (10/31/2023)    Readmission Risk Interventions     No data to display

## 2024-02-28 NOTE — Progress Notes (Signed)
 Palliative Medicine Progress Note   Patient Name: Garrett [name]       Date: 02/28/2024 DOB: 04/06/1976  Age: 48 y.o. MRN#: [id number] Attending Physician: Jude Harden GAILS, MD Primary Care Physician: Freddrick Johns Admit Date: 10/27/2023    HPI/Patient Profile: 48 year old male with past medical history of hypertension who presented to the ED on 10/27/2023 after he was found down at home.  He was found to have an acute large intracranial hemorrhage centered in the pons with intraventricular extension into the fourth ventricle and basal cisterns.  He was initially intubated.  After extensive discussion, family decided to proceed with tracheostomy, initially placed on 11/08/23 and replaced on 01/05/24.  PEG tube placed 11/08/2023.   Patient remains poorly responsive. Garrett [name] was de-cannulated 01/29/24 due to difficulty with passing suction catheter.   LOS - 124 days   PMT was initially consulted 10/30/2023 for discussion about goals of care with regard to tracheostomy versus one-way extubation. Signed off 11/18/23. PMT re-consulted on 02/06/24 to revisit GOC with family, in the setting of minimal clinical improvement.   Subjective: Chart reviewed. On 7/7, patient was emergently intubated due to worsening respiratory status due to suspected HCAP/aspiration.   11:30 - Patient assessed. His right eye is open but he does not track. No extremity movement noted.   18:05 - I later spoke with his mother/Garrett [name] by phone as a follow-up for palliative care needs and emotional support.  We reviewed our conversation from 7/6, during which I attempted to clarify code status as patient was documented as DNR with interventions.  Garrett [name] now states that she was unaware of patient's DNR status, and that family never agreed to that. I  did review with her that DNR status was discussed in the family meeting on 3/13, during which PMT, neurology, and PCCM were present.   Discussed with Garrett [name] that patient's neurologic status has not improved, and encouraged her to consider at what point they would consider de-escalating full scope medical interventions, keeping in mind the concept of quality of life. I also offered education on evidenced-based poor outcomes of resuscitation efforts in similar hospitalized patients.   Garrett [name] verbalizes understanding of this information, but confirms desire to continue full scope care. She reports they ultimately remain hopeful for improvement.   Objective:  Physical Exam Vitals reviewed.  Constitutional:  General: He is not in acute distress.    Appearance: He is ill-appearing.  Cardiovascular:     Rate and Rhythm: Normal rate.  Pulmonary:     Comments: Trach/vent Neurological:     Comments: Right eye open, does not track                Palliative Medicine Assessment & Plan   Assessment: Principal Problem:   ICH (intracerebral hemorrhage) (HCC) Active Problems:   Advanced care planning/counseling discussion   Pressure injury of skin   Goals of care, counseling/discussion   Poor prognosis   Chronic respiratory failure with hypoxia (HCC)   Acute respiratory failure (HCC)    Recommendations/Plan: Continue full code Continue full scope care Plan to proceed with trach next week Allow time for outcomes  Prognosis:  Poor overall  Discharge Planning: To Be Determined   Thank you for allowing the Palliative Medicine Team to assist in the care of this patient.   MDM - High   Recardo KATHEE Loll, NP   Please contact Palliative Medicine Team phone at [phone] for questions and concerns.  For individual providers, please see AMION.

## 2024-02-29 DIAGNOSIS — J150 Pneumonia due to Klebsiella pneumoniae: Secondary | ICD-10-CM | POA: Diagnosis not present

## 2024-02-29 DIAGNOSIS — J9601 Acute respiratory failure with hypoxia: Secondary | ICD-10-CM | POA: Diagnosis not present

## 2024-02-29 DIAGNOSIS — J690 Pneumonitis due to inhalation of food and vomit: Secondary | ICD-10-CM | POA: Diagnosis not present

## 2024-02-29 DIAGNOSIS — D509 Iron deficiency anemia, unspecified: Secondary | ICD-10-CM | POA: Diagnosis not present

## 2024-02-29 DIAGNOSIS — E440 Moderate protein-calorie malnutrition: Secondary | ICD-10-CM | POA: Diagnosis not present

## 2024-02-29 DIAGNOSIS — R1310 Dysphagia, unspecified: Secondary | ICD-10-CM | POA: Diagnosis not present

## 2024-02-29 DIAGNOSIS — I613 Nontraumatic intracerebral hemorrhage in brain stem: Secondary | ICD-10-CM | POA: Diagnosis not present

## 2024-02-29 DIAGNOSIS — I10 Essential (primary) hypertension: Secondary | ICD-10-CM | POA: Diagnosis not present

## 2024-02-29 DIAGNOSIS — I952 Hypotension due to drugs: Secondary | ICD-10-CM | POA: Diagnosis not present

## 2024-02-29 LAB — COMPREHENSIVE METABOLIC PANEL WITH GFR
ALT: 119 U/L — ABNORMAL HIGH (ref 0–44)
AST: 49 U/L — ABNORMAL HIGH (ref 15–41)
Albumin: 2.5 g/dL — ABNORMAL LOW (ref 3.5–5.0)
Alkaline Phosphatase: 59 U/L (ref 38–126)
Anion gap: 9 (ref 5–15)
BUN: 23 mg/dL — ABNORMAL HIGH (ref 6–20)
CO2: 30 mmol/L (ref 22–32)
Calcium: 9.2 mg/dL (ref 8.9–10.3)
Chloride: 103 mmol/L (ref 98–111)
Creatinine, Ser: 0.55 mg/dL — ABNORMAL LOW (ref 0.61–1.24)
GFR, Estimated: >60 mL/min (ref >60)
Glucose, Bld: 134 mg/dL — ABNORMAL HIGH (ref 70–99)
Potassium: 3.7 mmol/L (ref 3.5–5.1)
Sodium: 142 mmol/L (ref 135–145)
Total Bilirubin: 0.4 mg/dL (ref 0.0–1.2)
Total Protein: 6.5 g/dL (ref 6.5–8.1)

## 2024-02-29 LAB — CBC
HCT: 25.2 % — ABNORMAL LOW (ref 39.0–52.0)
Hemoglobin: 7.5 g/dL — ABNORMAL LOW (ref 13.0–17.0)
MCH: 28.7 pg (ref 26.0–34.0)
MCHC: 29.8 g/dL — ABNORMAL LOW (ref 30.0–36.0)
MCV: 96.6 fL (ref 80.0–100.0)
Platelets: 491 K/uL — ABNORMAL HIGH (ref 150–400)
RBC: 2.61 MIL/uL — ABNORMAL LOW (ref 4.22–5.81)
RDW: 17.1 % — ABNORMAL HIGH (ref 11.5–15.5)
WBC: 12.6 K/uL — ABNORMAL HIGH (ref 4.0–10.5)
nRBC: 0.4 % — ABNORMAL HIGH (ref 0.0–0.2)

## 2024-02-29 LAB — CULTURE, RESPIRATORY W GRAM STAIN

## 2024-02-29 LAB — GLUCOSE, CAPILLARY
Glucose-Capillary: 106 mg/dL — ABNORMAL HIGH (ref 70–99)
Glucose-Capillary: 113 mg/dL — ABNORMAL HIGH (ref 70–99)
Glucose-Capillary: 123 mg/dL — ABNORMAL HIGH (ref 70–99)
Glucose-Capillary: 132 mg/dL — ABNORMAL HIGH (ref 70–99)
Glucose-Capillary: 134 mg/dL — ABNORMAL HIGH (ref 70–99)
Glucose-Capillary: 139 mg/dL — ABNORMAL HIGH (ref 70–99)

## 2024-02-29 MED ORDER — POTASSIUM CHLORIDE 20 MEQ PO PACK
40.0000 meq | PACK | Freq: Once | ORAL | Status: AC
  Administered 2024-02-29: 40 meq
  Filled 2024-02-29: qty 2

## 2024-02-29 MED ORDER — POLYETHYLENE GLYCOL 3350 17 G PO PACK
17.0000 g | PACK | Freq: Two times a day (BID) | ORAL | Status: DC
  Administered 2024-02-29 – 2024-04-10 (×51): 17 g
  Filled 2024-02-29 (×52): qty 1

## 2024-02-29 MED ORDER — SENNA 8.6 MG PO TABS
1.0000 | ORAL_TABLET | Freq: Once | ORAL | Status: AC
  Administered 2024-02-29: 8.6 mg
  Filled 2024-02-29: qty 1

## 2024-02-29 NOTE — Plan of Care (Signed)
     Brief palliative note:   Chart reviewed. Discussed with primary team. Goals are clear for full code full scope.  PMT will sign-off at this time. Please place a new consult if further palliative needs arise.    Recardo Loll, NP-C Palliative Medicine   Please call Palliative Medicine team phone with any questions [phone]. For individual providers please see AMION.   No charge

## 2024-02-29 NOTE — Progress Notes (Signed)
 NAME:  Garrett [name], MRN:  [id number], DOB:  1976-08-17, LOS: 125 ADMISSION DATE:  10/27/2023, CONSULTATION DATE:  10/27/23 REFERRING MD:  Dr Madelyne, CHIEF COMPLAINT:  Found down   History of Present Illness:  48 y/o male with past medical history of hypertension who was found down for unknown downtime by his fiance when she came home from work. Reportedly, having agonal breathing.  He apparently had driven her to work this morning.  He has HTN but never checks his BP and does not follow with MD.  She says you can tell when his BP is really high; his eyes get blood shot and he is sweating.  No h/o seizures.  Besides almost daily [name] and cigarette smoking, she is not aware of any other drugs. Drug screen positive for opioids and he was given several doses of Narcan . Patient found to have:  1. 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 Basal cisterns and fourth ventricle are effaced without hydrocephalus at this time. 2. Trace additional subarachnoid hemorrhage along the left parietal convexity. BP in ED 262/156. His CXR revealed a RUL and perihilar infiltrates suggesting aspiration pneumonia. ED labs also revealed PH 7.169, LA 4.4, CPK 985. Patient was intubated in ED.  Pertinent  Medical History  Hypertension  Significant Hospital Events: Including procedures, antibiotic start and stop dates in addition to other pertinent events   3/8: Intubated in ED, pontine bleed/SAH. CT head 17:09 Acute large hemorrhage 4.1 cm in pons with intraventricular extension without hydrocephalus 3/9: CTA 03:14 AM No change in IPH in pons, similar biparietal [facility] 3/14: Now awake, appears locked in can follow commands with eyes. 3/16 Purposeful with left upper extremity  3/22 tracheostomy placed at bedside, bleeding issues overnight from trach site 3/24 PEG (Dr. Sebastian) 3/24 sputum  culture with MRSA 3/27 vomited. TF on hold. Abd film c/w ileus. Added reglan , got SSE. Did tolerate PSV all day  3/28 BMs x2 after SSE day prior. Added back TFs at 1/2 rated. Tolerated PS 3/29 Tolerated PS  3/31 Tolerating CPAP PS 15/5, TF on hold due to ileus. 4/1: con't to hold tube feeds today, restarting vancomycin  and sending tracheal aspirate as peaks are 37, plat 24, driving 19. Thick secretions. Fever overnight.  4/2: peaks improved. Ongoing hiccups. Trickle feeding. Neuro exam unchanged.  4/20: trach was changed to cuffless #8 4/28: not safe to swallow. Limited communication and he follows simple commands. On TF 5/1: on TC 28%, with large secretions. Working with speech and he is improving to initiate PMV trials under ST supervision   5/5: on TC 30%, 7 L/min. Trach #6 cuffless, changed 5/1 to facilitate PMV trials  5/12: on TC 21%, 6 L/min. Trach #6 cuffless, unable to produce sounds or speak on PMV. No significant resp secretions. On PEG TF 01/29/2024 tracheostomy was removed due to failure to well provide adequate airway 02/15/2024 pulmonary critical care consult. 7/7 patient obtunded and desaturating on NRB; transfer to icu for intubation; updated mother over phone who wants to reverse code status to full code; ent consulted 7/9 Remains intubated on no continuous sedation, right eye open but does not track movement  7/10 sleeping in bed this morning, no acute issues overnight  Interim History / Subjective:  No distress  Objective    Blood pressure (!) 126/97, pulse (!) 104, temperature 99.5 F (37.5 C), temperature source Axillary, resp. rate 20, height 5' 8 (  1.727 m), weight 93 kg, SpO2 100%.    Vent Mode: PRVC FiO2 (%):  [40 %] 40 % Set Rate:  [20 bmp] 20 bmp Vt Set:  [540 mL] 540 mL PEEP:  [5 cmH20] 5 cmH20 Plateau Pressure:  [26 cmH20] 26 cmH20   Intake/Output Summary (Last 24 hours) at 02/29/2024 0941 Last data filed at 02/29/2024 0930 Gross per 24 hour  Intake 1375  ml  Output 1500 ml  Net -125 ml   Filed Weights   02/26/24 0500 02/27/24 0423 02/28/24 0500  Weight: 102 kg 91 kg 93 kg    Examination: General 48 year old male currently on full vent support HENT left eye conjunctivitis improving. Keeping this covered. Copious oral secretions Pulm scattered rhonchi PCXR w/ improved aeration on 10th Card rrr Abd soft has PEG Ext warm dep edema brisk CR Neuro eyes open blinks but can't really tell if to command   Resolved problem list  Post sedation hypotension  Assessment and Plan   Acute hypoxic respiratory failure due to aspiration pneumonia w/ MRSA and Klebsiella PNA  -s/p tracheostomy placement 3/22 decannulated on 6/10 due to inability to pass suction catheter; reintubated on 7/7 - Completed 7 days of cefepime  and vancomycin  7/9 plan Continue ventilator support with lung protective strategies  Sats > 92% VAP bundle  PAD protocol RASS goal 0  ENT following redo tracheostomy tentatively planned for next week given body habitus might benefit for Bjork flap   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 Coli UTI plan needs foley exchange on 03/01/2024 Discussed potential placement of suprapubic catheter with urology 7/9 recommendations made to establish discharge disposition (patient will likely need to remain inpatient upwards of 6 weeks post procedure ) and family agreeance prior to formal consult.  Called mother 7/9 mother does NOT wish to proceed at this time   Moderate protein calorie malnutrition Dysphagia plan Continue tube feeds via PEG tube RD following  HTN plan Continue Norvasc  and Lopressor   Elevated LFTs -bumped a little today  Plan Repeat LFTs am  Left eye conjunctivitis Looking better plan Continue  Polytrim  drops x 7 days (started 7/8) Lacri-Lube to bilateral eyes  Hyperglycemia Currently w/ excellent glycemic control plan Continue SSI CBG checks every 4 CBG goal 140-180  Macrocytic anemia  Thrombocytosis  -No report of chronic alcohol  use. B12, folate and TSH normal plan Trend CBC Trigger for transfusion < 7  Poor Dentition-Upper right teeth and one left lower tooth have chipped off. plan Eventual dental evaluation  Best Practice (right click and Reselect all SmartList Selections daily)   Diet/type: NPO w/ meds via tube DVT prophylaxis: LMWH GI prophylaxis: H2B Lines: N/A Foley:  Yes, and it is still needed Code Status:  full code Last date of multidisciplinary goals of care discussion [7/8 attempted to call mother [place] over phone went straight to voicemail.]  CRITICAL CARE 32 min   02/29/2024, 9:41 AM

## 2024-02-29 NOTE — Plan of Care (Signed)
  Problem: Intracerebral Hemorrhage Tissue Perfusion: Goal: Complications of Intracerebral Hemorrhage will be minimized Outcome: Progressing   Problem: Nutrition: Goal: Risk of aspiration will decrease Outcome: Progressing Goal: Dietary intake will improve Outcome: Progressing   Problem: Skin Integrity: Goal: Risk for impaired skin integrity will decrease Outcome: Progressing   Problem: Tissue Perfusion: Goal: Adequacy of tissue perfusion will improve Outcome: Progressing

## 2024-02-29 NOTE — Plan of Care (Signed)
  Problem: Intracerebral Hemorrhage Tissue Perfusion: Goal: Complications of Intracerebral Hemorrhage will be minimized 02/29/2024 1611 by [place] Kron, Ellery SAILOR, RN Outcome: Progressing 02/29/2024 0859 by [place] Ellery SAILOR, RN Outcome: Progressing   Problem: Nutrition: Goal: Risk of aspiration will decrease 02/29/2024 1611 by [place] Ellery SAILOR, RN Outcome: Progressing 02/29/2024 0859 by [place] Ellery SAILOR, RN Outcome: Progressing Goal: Dietary intake will improve 02/29/2024 1611 by [place] Ellery SAILOR, RN Outcome: Progressing 02/29/2024 0859 by [place] Ellery SAILOR, RN Outcome: Progressing   Problem: Skin Integrity: Goal: Risk for impaired skin integrity will decrease Outcome: Progressing   Problem: Tissue Perfusion: Goal: Adequacy of tissue perfusion will improve Outcome: Progressing

## 2024-03-01 DIAGNOSIS — E440 Moderate protein-calorie malnutrition: Secondary | ICD-10-CM | POA: Diagnosis not present

## 2024-03-01 DIAGNOSIS — D509 Iron deficiency anemia, unspecified: Secondary | ICD-10-CM | POA: Diagnosis not present

## 2024-03-01 DIAGNOSIS — R1310 Dysphagia, unspecified: Secondary | ICD-10-CM | POA: Diagnosis not present

## 2024-03-01 DIAGNOSIS — J690 Pneumonitis due to inhalation of food and vomit: Secondary | ICD-10-CM | POA: Diagnosis not present

## 2024-03-01 DIAGNOSIS — J9601 Acute respiratory failure with hypoxia: Secondary | ICD-10-CM | POA: Diagnosis not present

## 2024-03-01 DIAGNOSIS — J150 Pneumonia due to Klebsiella pneumoniae: Secondary | ICD-10-CM | POA: Diagnosis not present

## 2024-03-01 DIAGNOSIS — I10 Essential (primary) hypertension: Secondary | ICD-10-CM | POA: Diagnosis not present

## 2024-03-01 DIAGNOSIS — I613 Nontraumatic intracerebral hemorrhage in brain stem: Secondary | ICD-10-CM | POA: Diagnosis not present

## 2024-03-01 LAB — GLUCOSE, CAPILLARY
Glucose-Capillary: 133 mg/dL — ABNORMAL HIGH (ref 70–99)
Glucose-Capillary: 130 mg/dL — ABNORMAL HIGH (ref 70–99)
Glucose-Capillary: 132 mg/dL — ABNORMAL HIGH (ref 70–99)
Glucose-Capillary: 114 mg/dL — ABNORMAL HIGH (ref 70–99)

## 2024-03-01 NOTE — Plan of Care (Signed)
  Problem: Intracerebral Hemorrhage Tissue Perfusion: Goal: Complications of Intracerebral Hemorrhage will be minimized Outcome: Not Progressing   Problem: Nutrition: Goal: Risk of aspiration will decrease Outcome: Not Progressing Goal: Dietary intake will improve Outcome: Not Progressing   Problem: Fluid Volume: Goal: Ability to maintain a balanced intake and output will improve Outcome: Not Progressing   Problem: Skin Integrity: Goal: Risk for impaired skin integrity will decrease Outcome: Not Progressing   Problem: Tissue Perfusion: Goal: Adequacy of tissue perfusion will improve Outcome: Not Progressing   Problem: Clinical Measurements: Goal: Ability to maintain clinical measurements within normal limits will improve Outcome: Not Progressing Goal: Will remain free from infection Outcome: Not Progressing Goal: Diagnostic test results will improve Outcome: Not Progressing Goal: Respiratory complications will improve Outcome: Not Progressing Goal: Cardiovascular complication will be avoided Outcome: Not Progressing   Problem: Activity: Goal: Risk for activity intolerance will decrease Outcome: Not Progressing   Problem: Nutrition: Goal: Adequate nutrition will be maintained Outcome: Not Progressing   Problem: Coping: Goal: Level of anxiety will decrease Outcome: Not Progressing   Problem: Elimination: Goal: Will not experience complications related to bowel motility Outcome: Not Progressing Goal: Will not experience complications related to urinary retention Outcome: Not Progressing   Problem: Pain Managment: Goal: General experience of comfort will improve and/or be controlled Outcome: Not Progressing   Problem: Safety: Goal: Ability to remain free from injury will improve Outcome: Not Progressing   Problem: Skin Integrity: Goal: Risk for impaired skin integrity will decrease Outcome: Not Progressing   Problem: Activity: Goal: Ability to tolerate  increased activity will improve Outcome: Not Progressing   Problem: Respiratory: Goal: Patent airway maintenance will improve Outcome: Not Progressing   Problem: Activity: Goal: Ability to tolerate increased activity will improve Outcome: Not Progressing   Problem: Respiratory: Goal: Ability to maintain a clear airway and adequate ventilation will improve Outcome: Not Progressing   Problem: Role Relationship: Goal: Method of communication will improve Outcome: Not Progressing

## 2024-03-01 NOTE — Plan of Care (Signed)
  Problem: Nutrition: Goal: Risk of aspiration will decrease Outcome: Not Progressing   Problem: Fluid Volume: Goal: Ability to maintain a balanced intake and output will improve Outcome: Not Progressing

## 2024-03-01 NOTE — Progress Notes (Signed)
 NAME:  Garrett [name], MRN:  [id number], DOB:  12-12-1975, LOS: 126 ADMISSION DATE:  10/27/2023, CONSULTATION DATE:  10/27/23 REFERRING MD:  Dr Madelyne, CHIEF COMPLAINT:  Found down   History of Present Illness:  48 y/o male with past medical history of hypertension who was found down for unknown downtime by his fiance when she came home from work. Reportedly, having agonal breathing.  He apparently had driven her to work this morning.  He has HTN but never checks his BP and does not follow with MD.  She says you can tell when his BP is really high; his eyes get blood shot and he is sweating.  No h/o seizures.  Besides almost daily [name] and cigarette smoking, she is not aware of any other drugs. Drug screen positive for opioids and he was given several doses of Narcan . Patient found to have:  1. 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 Basal cisterns and fourth ventricle are effaced without hydrocephalus at this time. 2. Trace additional subarachnoid hemorrhage along the left parietal convexity. BP in ED 262/156. His CXR revealed a RUL and perihilar infiltrates suggesting aspiration pneumonia. ED labs also revealed PH 7.169, LA 4.4, CPK 985. Patient was intubated in ED.  Pertinent  Medical History  Hypertension  Significant Hospital Events: Including procedures, antibiotic start and stop dates in addition to other pertinent events   3/8: Intubated in ED, pontine bleed/SAH. CT head 17:09 Acute large hemorrhage 4.1 cm in pons with intraventricular extension without hydrocephalus 3/9: CTA 03:14 AM No change in IPH in pons, similar biparietal [facility] 3/14: Now awake, appears locked in can follow commands with eyes. 3/16 Purposeful with left upper extremity  3/22 tracheostomy placed at bedside, bleeding issues overnight from trach site 3/24 PEG (Dr. Sebastian) 3/24 sputum  culture with MRSA 3/27 vomited. TF on hold. Abd film c/w ileus. Added reglan , got SSE. Did tolerate PSV all day  3/28 BMs x2 after SSE day prior. Added back TFs at 1/2 rated. Tolerated PS 3/29 Tolerated PS  3/31 Tolerating CPAP PS 15/5, TF on hold due to ileus. 4/1: con't to hold tube feeds today, restarting vancomycin  and sending tracheal aspirate as peaks are 37, plat 24, driving 19. Thick secretions. Fever overnight.  4/2: peaks improved. Ongoing hiccups. Trickle feeding. Neuro exam unchanged.  4/20: trach was changed to cuffless #8 4/28: not safe to swallow. Limited communication and he follows simple commands. On TF 5/1: on TC 28%, with large secretions. Working with speech and he is improving to initiate PMV trials under ST supervision   5/5: on TC 30%, 7 L/min. Trach #6 cuffless, changed 5/1 to facilitate PMV trials  5/12: on TC 21%, 6 L/min. Trach #6 cuffless, unable to produce sounds or speak on PMV. No significant resp secretions. On PEG TF 01/29/2024 tracheostomy was removed due to failure to well provide adequate airway 02/15/2024 pulmonary critical care consult. 7/7 patient obtunded and desaturating on NRB; transfer to icu for intubation; updated mother over phone who wants to reverse code status to full code; ent consulted 7/9 Remains intubated on no continuous sedation, right eye open but does not track movement  7/10 sleeping in bed this morning, no acute issues overnight  Interim History / Subjective:   No change  Objective    Blood pressure (!) 137/99, pulse (!) 101, temperature 98.9 F (37.2 C), temperature source Oral, resp. rate 20, height 5'  8 (1.727 m), weight 98 kg, SpO2 99%.    Vent Mode: PRVC FiO2 (%):  [40 %] 40 % Set Rate:  [20 bmp] 20 bmp Vt Set:  [540 mL] 540 mL PEEP:  [5 cmH20] 5 cmH20 Plateau Pressure:  [18 cmH20-20 cmH20] 18 cmH20   Intake/Output Summary (Last 24 hours) at 03/01/2024 0929 Last data filed at 03/01/2024 0800 Gross per 24 hour  Intake  1265 ml  Output 1025 ml  Net 240 ml   Filed Weights   02/27/24 0423 02/28/24 0500 03/01/24 0342  Weight: 91 kg 93 kg 98 kg    Examination:  General laying in bed not interactive blinks eyes Hent NCAT patch on left eye. Less injected Pulm coarse rhonchi Card rrr Abd soft Ext warm  Neuro unchanged. Locked in eyes open. 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 Coli UTI plan needs foley exchange on 03/01/2024 (today) will order  Discussed potential placement of suprapubic catheter with urology 7/9 recommendations made to establish discharge disposition (patient will likely need to remain inpatient upwards of 6 weeks post procedure ) and family agreeance prior to formal consult.  Called mother 7/9 mother does NOT wish to proceed at this time   Moderate protein calorie malnutrition Dysphagia plan Continue tube feeds via PEG tube RD following  HTN plan Continue Norvasc  and Lopressor   Elevated LFTs -bumped a little today  Plan F/u cmp  Left eye conjunctivitis Looking better plan Continue Polytrim  drops x 7 days (started 7/8) Lacri-Lube to bilateral  eyes  Hyperglycemia Currently w/ excellent glycemic control plan Continue SSI CBG checks every 4 CBG goal 140-180  Macrocytic anemia  Thrombocytosis  -No report of chronic alcohol  use. B12, folate and TSH normal plan PRN CBC will get one Monday  Trigger for transfusion < 7  Poor Dentition-Upper right teeth and one left lower tooth have chipped off. plan Eventual dental evaluation  Best Practice (right click and Reselect all SmartList Selections daily)   Diet/type: NPO w/ meds via tube DVT prophylaxis: LMWH GI prophylaxis: H2B Lines: N/A Foley:  Yes, and it is still needed Code Status:  full code Last date of multidisciplinary goals of care discussion [7/8 attempted to call mother [place] over phone went straight to voicemail.]  CRITICAL CARE NA my time 23 min   03/01/2024, 9:29 AM

## 2024-03-02 DIAGNOSIS — J9601 Acute respiratory failure with hypoxia: Secondary | ICD-10-CM | POA: Diagnosis not present

## 2024-03-02 DIAGNOSIS — J690 Pneumonitis due to inhalation of food and vomit: Secondary | ICD-10-CM | POA: Diagnosis not present

## 2024-03-02 DIAGNOSIS — I613 Nontraumatic intracerebral hemorrhage in brain stem: Secondary | ICD-10-CM | POA: Diagnosis not present

## 2024-03-02 DIAGNOSIS — D509 Iron deficiency anemia, unspecified: Secondary | ICD-10-CM | POA: Diagnosis not present

## 2024-03-02 DIAGNOSIS — J150 Pneumonia due to Klebsiella pneumoniae: Secondary | ICD-10-CM | POA: Diagnosis not present

## 2024-03-02 DIAGNOSIS — I10 Essential (primary) hypertension: Secondary | ICD-10-CM | POA: Diagnosis not present

## 2024-03-02 DIAGNOSIS — E440 Moderate protein-calorie malnutrition: Secondary | ICD-10-CM | POA: Diagnosis not present

## 2024-03-02 DIAGNOSIS — R1310 Dysphagia, unspecified: Secondary | ICD-10-CM | POA: Diagnosis not present

## 2024-03-02 LAB — COMPREHENSIVE METABOLIC PANEL WITH GFR
ALT: 103 U/L — ABNORMAL HIGH (ref 0–44)
AST: 35 U/L (ref 15–41)
Albumin: 2.4 g/dL — ABNORMAL LOW (ref 3.5–5.0)
Alkaline Phosphatase: 61 U/L (ref 38–126)
Anion gap: 9 (ref 5–15)
BUN: 23 mg/dL — ABNORMAL HIGH (ref 6–20)
CO2: 27 mmol/L (ref 22–32)
Calcium: 9.1 mg/dL (ref 8.9–10.3)
Chloride: 106 mmol/L (ref 98–111)
Creatinine, Ser: 0.53 mg/dL — ABNORMAL LOW (ref 0.61–1.24)
GFR, Estimated: >60 mL/min (ref >60)
Glucose, Bld: 144 mg/dL — ABNORMAL HIGH (ref 70–99)
Potassium: 3.6 mmol/L (ref 3.5–5.1)
Sodium: 142 mmol/L (ref 135–145)
Total Bilirubin: 0.5 mg/dL (ref 0.0–1.2)
Total Protein: 6.3 g/dL — ABNORMAL LOW (ref 6.5–8.1)

## 2024-03-02 LAB — CBC
HCT: 23.9 % — ABNORMAL LOW (ref 39.0–52.0)
Hemoglobin: 7.1 g/dL — ABNORMAL LOW (ref 13.0–17.0)
MCH: 28.4 pg (ref 26.0–34.0)
MCHC: 29.7 g/dL — ABNORMAL LOW (ref 30.0–36.0)
MCV: 95.6 fL (ref 80.0–100.0)
Platelets: 479 K/uL — ABNORMAL HIGH (ref 150–400)
RBC: 2.5 MIL/uL — ABNORMAL LOW (ref 4.22–5.81)
RDW: 17.9 % — ABNORMAL HIGH (ref 11.5–15.5)
WBC: 10.7 K/uL — ABNORMAL HIGH (ref 4.0–10.5)
nRBC: 0 % (ref 0.0–0.2)

## 2024-03-02 LAB — GLUCOSE, CAPILLARY
Glucose-Capillary: 124 mg/dL — ABNORMAL HIGH (ref 70–99)
Glucose-Capillary: 124 mg/dL — ABNORMAL HIGH (ref 70–99)
Glucose-Capillary: 128 mg/dL — ABNORMAL HIGH (ref 70–99)
Glucose-Capillary: 139 mg/dL — ABNORMAL HIGH (ref 70–99)
Glucose-Capillary: 156 mg/dL — ABNORMAL HIGH (ref 70–99)

## 2024-03-02 NOTE — Progress Notes (Addendum)
 NAME:  Garrett [name], MRN:  [id number], DOB:  1975-12-07, LOS: 127 ADMISSION DATE:  10/27/2023, CONSULTATION DATE:  10/27/23 REFERRING MD:  Dr Madelyne, CHIEF COMPLAINT:  Found down   History of Present Illness:  48 y/o male with past medical history of hypertension who was found down for unknown downtime by his fiance when she came home from work. Reportedly, having agonal breathing.  He apparently had driven her to work this morning.  He has HTN but never checks his BP and does not follow with MD.  She says you can tell when his BP is really high; his eyes get blood shot and he is sweating.  No h/o seizures.  Besides almost daily [name] and cigarette smoking, she is not aware of any other drugs. Drug screen positive for opioids and he was given several doses of Narcan . Patient found to have:  1. 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 Basal cisterns and fourth ventricle are effaced without hydrocephalus at this time. 2. Trace additional subarachnoid hemorrhage along the left parietal convexity. BP in ED 262/156. His CXR revealed a RUL and perihilar infiltrates suggesting aspiration pneumonia. ED labs also revealed PH 7.169, LA 4.4, CPK 985. Patient was intubated in ED.  Pertinent  Medical History  Hypertension  Significant Hospital Events: Including procedures, antibiotic start and stop dates in addition to other pertinent events   3/8: Intubated in ED, pontine bleed/SAH. CT head 17:09 Acute large hemorrhage 4.1 cm in pons with intraventricular extension without hydrocephalus 3/9: CTA 03:14 AM No change in IPH in pons, similar biparietal [facility] 3/14: Now awake, appears locked in can follow commands with eyes. 3/16 Purposeful with left upper extremity  3/22 tracheostomy placed at bedside, bleeding issues overnight from trach site 3/24 PEG (Dr. Sebastian) 3/24 sputum  culture with MRSA 3/27 vomited. TF on hold. Abd film c/w ileus. Added reglan , got SSE. Did tolerate PSV all day  3/28 BMs x2 after SSE day prior. Added back TFs at 1/2 rated. Tolerated PS 3/29 Tolerated PS  3/31 Tolerating CPAP PS 15/5, TF on hold due to ileus. 4/1: con't to hold tube feeds today, restarting vancomycin  and sending tracheal aspirate as peaks are 37, plat 24, driving 19. Thick secretions. Fever overnight.  4/2: peaks improved. Ongoing hiccups. Trickle feeding. Neuro exam unchanged.  4/20: trach was changed to cuffless #8 4/28: not safe to swallow. Limited communication and he follows simple commands. On TF 5/1: on TC 28%, with large secretions. Working with speech and he is improving to initiate PMV trials under ST supervision   5/5: on TC 30%, 7 L/min. Trach #6 cuffless, changed 5/1 to facilitate PMV trials  5/12: on TC 21%, 6 L/min. Trach #6 cuffless, unable to produce sounds or speak on PMV. No significant resp secretions. On PEG TF 01/29/2024 tracheostomy was removed due to failure to well provide adequate airway 02/15/2024 pulmonary critical care consult. 7/7 patient obtunded and desaturating on NRB; transfer to icu for intubation; updated mother over phone who wants to reverse code status to full code; ent consulted 7/9 Remains intubated on no continuous sedation, right eye open but does not track movement  7/10 sleeping in bed this morning, no acute issues overnight  Interim History / Subjective:   No change  Objective    Blood pressure 123/84, pulse (!) 107, temperature 98.3 F (36.8 C), temperature source Axillary, resp. rate 18, height 5' 8 (  1.727 m), weight 97 kg, SpO2 97%.    Vent Mode: PRVC FiO2 (%):  [40 %] 40 % Set Rate:  [20 bmp] 20 bmp Vt Set:  [540 mL] 540 mL PEEP:  [5 cmH20] 5 cmH20 Plateau Pressure:  [20 cmH20-25 cmH20] 24 cmH20   Intake/Output Summary (Last 24 hours) at 03/02/2024 1107 Last data filed at 03/02/2024 1000 Gross per 24 hour  Intake  1325 ml  Output 1140 ml  Net 185 ml   Filed Weights   02/28/24 0500 03/01/24 0342 03/02/24 0413  Weight: 93 kg 98 kg 97 kg    Examination: General laying in bed. Computer is on. Appears like he is watching it HENT NCAT the left eye is still injected and covered Pulm clear dec bases Card rrr Abd soft  Ext warm dep edema  Neuro eyes open, locked in Gu cl yellow    Resolved problem list  Post sedation hypotension  Assessment and Plan   Acute hypoxic respiratory failure due to aspiration pneumonia w/ MRSA and Klebsiella PNA  -s/p tracheostomy placement 3/22 decannulated on 6/10 due to inability to pass suction catheter; reintubated on 7/7 - Completed 7 days of cefepime  and vancomycin  7/9 plan Continue ventilator support with lung protective strategies  Sats > 92% VAP bundle  PAD protocol RASS goal 0  ENT following redo tracheostomy tentatively planned for next week given body habitus might benefit for Bjork flap  Can try weaning again after trach Will get cxr am    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 Coli UTI plan Foley exchanged 7/12 so next change due aug 12  Discussed potential placement of suprapubic catheter with urology 7/9 recommendations made to establish discharge disposition (patient will likely need to remain inpatient upwards of 6 weeks post procedure ) and family agreeance prior to formal consult.  Called mother 7/9 mother does NOT wish to proceed at this time   Moderate protein calorie malnutrition Dysphagia plan Continue tube feeds via PEG tube RD following  HTN plan Continue Norvasc  and Lopressor   Elevated LFTs -bumped a little improving again today  Plan F/u cmp  Left eye conjunctivitis Looking better plan Continue  Polytrim  drops x 7 days (started 7/8) Lacri-Lube to bilateral eyes  Hyperglycemia Currently w/ excellent glycemic control plan Continue SSI CBG checks every 4 CBG goal 140-180  Macrocytic anemia  Thrombocytosis  -No report of chronic alcohol  use. B12, folate and TSH normal plan Getting cbc tomorrow. Hgb 7.1 suspect will end up needing a unit  Trigger for transfusion < 7  Poor Dentition-Upper right teeth and one left lower tooth have chipped off. plan Eventual dental evaluation  Best Practice (right click and Reselect all SmartList Selections daily)   Diet/type: NPO w/ meds via tube DVT prophylaxis: LMWH GI prophylaxis: H2B Lines: N/A Foley:  Yes, and it is still needed Code Status:  full code Last date of multidisciplinary goals of care discussion [7/8 attempted to call mother [place] over phone went straight to voicemail.]  CRITICAL CARE  NA my time 20 min   03/02/2024, 11:07 AM

## 2024-03-02 NOTE — Progress Notes (Signed)
 eLink Physician-Brief Progress Note Patient Name: Bandy Honaker DOB: Dec 06, 1975 MRN: [id number]   Date of Service  03/02/2024  HPI/Events of Note  pontine hemorrhage w/IV & basal cisterns extension, mass effect & cytotoxic edema, SAH   Awaiting permanent trach placement this upcoming week  Nursing leadership requesting enter unit transfer between ICUs  eICU Interventions  At this point, I see no contraindication to proceeding, transfer order placed     Intervention Category Minor Interventions: Routine modifications to care plan (e.g. PRN medications for pain, fever)  Diquan Kassis 03/02/2024, 10:34 PM

## 2024-03-02 NOTE — Plan of Care (Signed)
  Problem: Intracerebral Hemorrhage Tissue Perfusion: Goal: Complications of Intracerebral Hemorrhage will be minimized Outcome: Progressing   Problem: Nutrition: Goal: Risk of aspiration will decrease Outcome: Progressing Goal: Dietary intake will improve Outcome: Progressing   Problem: Fluid Volume: Goal: Ability to maintain a balanced intake and output will improve Outcome: Progressing

## 2024-03-03 ENCOUNTER — Inpatient Hospital Stay (HOSPITAL_COMMUNITY)

## 2024-03-03 DIAGNOSIS — D509 Iron deficiency anemia, unspecified: Secondary | ICD-10-CM | POA: Diagnosis not present

## 2024-03-03 DIAGNOSIS — N390 Urinary tract infection, site not specified: Secondary | ICD-10-CM

## 2024-03-03 DIAGNOSIS — J690 Pneumonitis due to inhalation of food and vomit: Secondary | ICD-10-CM | POA: Diagnosis not present

## 2024-03-03 DIAGNOSIS — J9601 Acute respiratory failure with hypoxia: Secondary | ICD-10-CM | POA: Diagnosis not present

## 2024-03-03 DIAGNOSIS — J15212 Pneumonia due to Methicillin resistant Staphylococcus aureus: Secondary | ICD-10-CM | POA: Diagnosis not present

## 2024-03-03 DIAGNOSIS — R1310 Dysphagia, unspecified: Secondary | ICD-10-CM | POA: Diagnosis not present

## 2024-03-03 DIAGNOSIS — I613 Nontraumatic intracerebral hemorrhage in brain stem: Secondary | ICD-10-CM | POA: Diagnosis not present

## 2024-03-03 DIAGNOSIS — I10 Essential (primary) hypertension: Secondary | ICD-10-CM | POA: Diagnosis not present

## 2024-03-03 DIAGNOSIS — E440 Moderate protein-calorie malnutrition: Secondary | ICD-10-CM | POA: Diagnosis not present

## 2024-03-03 LAB — BASIC METABOLIC PANEL WITH GFR
Anion gap: 7 (ref 5–15)
BUN: 25 mg/dL — ABNORMAL HIGH (ref 6–20)
CO2: 29 mmol/L (ref 22–32)
Calcium: 8.9 mg/dL (ref 8.9–10.3)
Chloride: 106 mmol/L (ref 98–111)
Creatinine, Ser: 0.5 mg/dL — ABNORMAL LOW (ref 0.61–1.24)
GFR, Estimated: >60 mL/min (ref >60)
Glucose, Bld: 111 mg/dL — ABNORMAL HIGH (ref 70–99)
Potassium: 3.8 mmol/L (ref 3.5–5.1)
Sodium: 142 mmol/L (ref 135–145)

## 2024-03-03 LAB — CBC
HCT: 26 % — ABNORMAL LOW (ref 39.0–52.0)
Hemoglobin: 7.6 g/dL — ABNORMAL LOW (ref 13.0–17.0)
MCH: 28.7 pg (ref 26.0–34.0)
MCHC: 29.2 g/dL — ABNORMAL LOW (ref 30.0–36.0)
MCV: 98.1 fL (ref 80.0–100.0)
Platelets: 543 K/uL — ABNORMAL HIGH (ref 150–400)
RBC: 2.65 MIL/uL — ABNORMAL LOW (ref 4.22–5.81)
RDW: 18 % — ABNORMAL HIGH (ref 11.5–15.5)
WBC: 11.7 K/uL — ABNORMAL HIGH (ref 4.0–10.5)
nRBC: 0.2 % (ref 0.0–0.2)

## 2024-03-03 LAB — BLOOD GAS, VENOUS
Acid-Base Excess: 7.2 mmol/L — ABNORMAL HIGH (ref 0.0–2.0)
Bicarbonate: 31.2 mmol/L — ABNORMAL HIGH (ref 20.0–28.0)
O2 Saturation: 70.5 %
Patient temperature: 36.8
pCO2, Ven: 41 mmHg — ABNORMAL LOW (ref 44–60)
pH, Ven: 7.49 — ABNORMAL HIGH (ref 7.25–7.43)
pO2, Ven: 39 mmHg (ref 32–45)

## 2024-03-03 LAB — GLUCOSE, CAPILLARY
Glucose-Capillary: 105 mg/dL — ABNORMAL HIGH (ref 70–99)
Glucose-Capillary: 107 mg/dL — ABNORMAL HIGH (ref 70–99)
Glucose-Capillary: 122 mg/dL — ABNORMAL HIGH (ref 70–99)
Glucose-Capillary: 84 mg/dL (ref 70–99)
Glucose-Capillary: 97 mg/dL (ref 70–99)

## 2024-03-03 LAB — PHOSPHORUS: Phosphorus: 3.8 mg/dL (ref 2.5–4.6)

## 2024-03-03 LAB — MAGNESIUM: Magnesium: 2.3 mg/dL (ref 1.7–2.4)

## 2024-03-03 MED ORDER — ENOXAPARIN SODIUM 40 MG/0.4ML IJ SOSY
40.0000 mg | PREFILLED_SYRINGE | Freq: Every day | INTRAMUSCULAR | Status: DC
  Administered 2024-03-06 – 2024-06-21 (×111): 40 mg via SUBCUTANEOUS
  Filled 2024-03-03 (×109): qty 0.4

## 2024-03-03 NOTE — Progress Notes (Signed)
 NAME:  Garrett [name], MRN:  [id number], DOB:  1976-03-03, LOS: 128 ADMISSION DATE:  10/27/2023, CONSULTATION DATE:  10/27/23 REFERRING MD:  Dr Madelyne, CHIEF COMPLAINT:  Found down   History of Present Illness:  48 y/o male with past medical history of hypertension who was found down for unknown downtime by his fiance when she came home from work. Reportedly, having agonal breathing.  He apparently had driven her to work this morning.  He has HTN but never checks his BP and does not follow with MD.  She says you can tell when his BP is really high; his eyes get blood shot and he is sweating.  No h/o seizures.  Besides almost daily [name] and cigarette smoking, she is not aware of any other drugs. Drug screen positive for opioids and he was given several doses of Narcan . Patient found to have:  1. 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 Basal cisterns and fourth ventricle are effaced without hydrocephalus at this time. 2. Trace additional subarachnoid hemorrhage along the left parietal convexity. BP in ED 262/156. His CXR revealed a RUL and perihilar infiltrates suggesting aspiration pneumonia. ED labs also revealed PH 7.169, LA 4.4, CPK 985. Patient was intubated in ED.  Pertinent  Medical History  Hypertension  Significant Hospital Events: Including procedures, antibiotic start and stop dates in addition to other pertinent events   3/8: Intubated in ED, pontine bleed/SAH. CT head 17:09 Acute large hemorrhage 4.1 cm in pons with intraventricular extension without hydrocephalus 3/9: CTA 03:14 AM No change in IPH in pons, similar biparietal [facility] 3/14: Now awake, appears locked in can follow commands with eyes. 3/16 Purposeful with left upper extremity  3/22 tracheostomy placed at bedside, bleeding issues overnight from trach site 3/24 PEG (Dr. Sebastian) 3/24 sputum  culture with MRSA 3/27 vomited. TF on hold. Abd film c/w ileus. Added reglan , got SSE. Did tolerate PSV all day  3/28 BMs x2 after SSE day prior. Added back TFs at 1/2 rated. Tolerated PS 3/29 Tolerated PS  3/31 Tolerating CPAP PS 15/5, TF on hold due to ileus. 4/1: con't to hold tube feeds today, restarting vancomycin  and sending tracheal aspirate as peaks are 37, plat 24, driving 19. Thick secretions. Fever overnight.  4/2: peaks improved. Ongoing hiccups. Trickle feeding. Neuro exam unchanged.  4/20: trach was changed to cuffless #8 4/28: not safe to swallow. Limited communication and he follows simple commands. On TF 5/1: on TC 28%, with large secretions. Working with speech and he is improving to initiate PMV trials under ST supervision   5/5: on TC 30%, 7 L/min. Trach #6 cuffless, changed 5/1 to facilitate PMV trials  5/12: on TC 21%, 6 L/min. Trach #6 cuffless, unable to produce sounds or speak on PMV. No significant resp secretions. On PEG TF 01/29/2024 tracheostomy was removed due to failure to well provide adequate airway 02/15/2024 pulmonary critical care consult. 7/7 patient obtunded and desaturating on NRB; transfer to icu for intubation; updated mother over phone who wants to reverse code status to full code; ent consulted 7/9 Remains intubated on no continuous sedation, right eye open but does not track movement  7/10 sleeping in bed this morning, no acute issues overnight  Interim History / Subjective:  No events overnight Failed SBT 2/2 apnea   Objective    Blood pressure 117/89, pulse 88, temperature 98.3 F (36.8 C), temperature source Axillary, resp. rate  20, height 5' 8 (1.727 m), weight 97 kg, SpO2 98%.    Vent Mode: PRVC FiO2 (%):  [40 %] 40 % Set Rate:  [20 bmp] 20 bmp Vt Set:  [540 mL] 540 mL PEEP:  [5 cmH20] 5 cmH20 Plateau Pressure:  [22 cmH20-24 cmH20] 22 cmH20   Intake/Output Summary (Last 24 hours) at 03/03/2024 0842 Last data filed at 03/03/2024  0600 Gross per 24 hour  Intake 1009.67 ml  Output 750 ml  Net 259.67 ml   Filed Weights   02/28/24 0500 03/01/24 0342 03/02/24 0413  Weight: 93 kg 98 kg 97 kg    Examination: General: chronically ill appearing male lying in bed in NAD  HEENT: MM pink/moist, ETT, R pupil 3/r, left eye patched, dressing over neck area  Neuro: R eye open, ?tracking at times, will wiggle L toes and fingers to command, RLE w/d pain CV: rr, NSR, no murmur, RUE PICC PULM:  MV supported, coarse, no wheeze, few, pale yellow secretions, +cough, not over breathing rr, apneic on SBT this am GI: soft, bs+, NT, obese, LUQ PEG, foley- cyu, last BM 7/13 Extremities: warm/dry, trace dependent UE edema, no pitting tibial  Skin: no rashes   99.7 tmax  Labs> K 3.8, BUN/ sCr 25/ 0.50, Mag 2.3, WBC 11.7, H/H 7.1/ 26> 7.6/ 26, plts 543  UOP 750 ml/ 24hrs +259 ml Net -2.6L  Resolved problem list  Post sedation hypotension  Assessment and Plan   Acute hypoxic respiratory failure due to aspiration pneumonia w/ MRSA and Klebsiella PNA  -s/p tracheostomy placement 3/22 decannulated on 6/10 due to inability to pass suction catheter; reintubated on 7/7 - Completed 7 days of cefepime  and vancomycin  7/9 Plan P:  -  cont full MV support, 4-8cc/kg IBW with goal Pplat <30 and DP<15  - cont to fail SBT due to apnea> check VBG to ensure not over ventilating pt - VAP prevention protocol/ H2B - PAD protocol for sedation> prn fentanyl  w/ bowel regimen - intermittent CXR - wean FiO2 as able for SpO2 >92%  - aggressive pulmonary hygiene - prn duonebs  - plans for redo tracheostomy per ENT- TBD this week  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 Coli UTI Discussed potential placement of suprapubic catheter with urology 7/9 recommendations made to establish discharge disposition  (patient will likely need to remain inpatient upwards of 6 weeks post procedure ) and family agreeance prior to formal consult.  Called mother 7/9 mother does NOT wish to proceed at this time  - off abx 7/9 - Foley exchanged 7/12, next change due Aug 12   Moderate protein calorie malnutrition Dysphagia plan - cont TF/ EN via PEG per RD - NPO/ aspiration precautions   HTN plan - remains hemodynamically stable - cont Norvasc  5mg  and Lopressor  50mg  BID  Elevated LFTs -improved 7/13 Plan - trend LFTs periodically   Left eye conjunctivitis plan - cont Polytrim  drops x 7 days (started 7/8) - Lacri-Lube to bilateral eyes  Hyperglycemia, resolved - remains controlled on BMET - prn CBG  Macrocytic anemia  Thrombocytosis  - No report of chronic alcohol  use. B12, folate and TSH normal Plan - stable H/H, trend CBC - transfuse for Hgb < 7  Poor Dentition-Upper right teeth and one left lower tooth have chipped off. plan - Eventual dental evaluation  Best Practice (right click  and Reselect all SmartList Selections daily)   Diet/type: tubefeeds and NPO w/ meds via tube DVT prophylaxis: LMWH GI prophylaxis: H2B Lines: N/A RUE DL PICC 2/2> Foley:  Yes, and it is still needed Code Status:  full code Last date of multidisciplinary goals of care discussion [7/8 attempted to call mother [place] over phone went straight to voicemail.]  Pending update 7/14, no family at bedside.   CCT: 30 mins   Lyle Pesa, MSN, AG-ACNP-BC [facility] 03/03/2024, 8:42 AM  See Amion for pager If no response to pager , please call [phone] until 7pm After 7:00 pm call Elink  [phone]

## 2024-03-03 NOTE — Progress Notes (Signed)
 Nutrition Follow-up  DOCUMENTATION CODES:   Not applicable  INTERVENTION:  Tube feeding via PEG:  Osmolite 1.5 @55mL /h (1320mL daily) Prosource TF20 60 ml BID Provides 2220 kcal, 122 gm protein, 1005 ml free water  daily  MVI with minerals daily Banatrol BID to aid in adding bulk to stools  NUTRITION DIAGNOSIS:  Inadequate oral intake related to inability to eat as evidenced by NPO status. - Being addressed via TF  GOAL:  Patient will meet greater than or equal to 90% of their needs - Met via TF  MONITOR:  Labs, Weight trends, TF tolerance, Skin  REASON FOR ASSESSMENT:  Consult Enteral/tube feeding initiation and management  ASSESSMENT:  Pt with PMH of HTN, daily mariajuana, and smoker admitted after being found down at home with pontine hemorrhagic stroke and trace SAH. UDS positive for opioids. Noted aspiration PNA on admission.  3/08 - admitted with [facility], intubated 3/14 - s/p cortrak placement (tip gastric) 3/22 - s/p tracheostomy 3/24 - s/p PEG 4/20 - Cuff changed to cuffless 5/10 - PEG dislodged, TF's stopped  5/11 - PEG replaced, TF's restarted at goal  6/10 - trach removed 7/07- desat, transferred back to 4N ICU, re-intubated   Discussed with RN. TF stopped this morning. For possible tracheostomy today or tomorrow. Other than that no concerns.  Per ENT note, plan for trach on Wednesday. Reached out to [facility] [employer] NP about restarting tube feeds. NP ok with restarting, pt will just be NPO Tuesday night for procedure.   Patient is currently intubated on ventilator support. MV: 10.7 L/min MAP (cuff): 100 Temp (24hrs), Avg:99.1 F (37.3 C), Min:98.3 F (36.8 C), Max:99.7 F (37.6 C)  Admit weight: 105.2 kg  Current weight: 97 kg (7/13)  Nutrition Related Medications: Colace, Pepcid , Banatrol TF, MVI, Miralax  Labs: reviewed.   CBG: 105-156 mg/dL x 24 hrs  UOP: 249 mL x 24 hrs   Diet Order:   Diet Order             Diet NPO time specified  Diet effective midnight                   EDUCATION NEEDS:  No education needs have been identified at this time  Skin:  Skin Assessment: Skin Integrity Issues: Skin Integrity Issues:: Other (Comment) Stage II: Stage 2, perineum Other: Non pressure wound, left eye  Last BM:  7/13  Height:  Ht Readings from Last 1 Encounters:  02/25/24 5' 8 (1.727 m)   Weight:  Wt Readings from Last 1 Encounters:  03/02/24 97 kg   Ideal Body Weight:  70 kg  BMI:  Body mass index is 32.52 kg/m.  Estimated Nutritional Needs:  Kcal:  2000-2200 Protein:  125-140 grams Fluid:  >2 L/day   Nestora Glatter RD, LDN Clinical Dietitian

## 2024-03-03 NOTE — Plan of Care (Signed)
 ENT Plan of Care: Posted for tracheostomy on Wednesday 03/05/2024. Recommend minimal vent settings which it appears patient is already on; please make NPO at midnight and hold Lovenox  day of surgery. Page ENT with questions Garrett [name]

## 2024-03-03 NOTE — Progress Notes (Signed)
 Pt transported from 4N25 to 3M12 without event.

## 2024-03-04 ENCOUNTER — Inpatient Hospital Stay (HOSPITAL_COMMUNITY)

## 2024-03-04 DIAGNOSIS — I613 Nontraumatic intracerebral hemorrhage in brain stem: Secondary | ICD-10-CM | POA: Diagnosis not present

## 2024-03-04 DIAGNOSIS — J690 Pneumonitis due to inhalation of food and vomit: Secondary | ICD-10-CM | POA: Diagnosis not present

## 2024-03-04 DIAGNOSIS — D509 Iron deficiency anemia, unspecified: Secondary | ICD-10-CM | POA: Diagnosis not present

## 2024-03-04 DIAGNOSIS — N390 Urinary tract infection, site not specified: Secondary | ICD-10-CM | POA: Diagnosis not present

## 2024-03-04 DIAGNOSIS — J15212 Pneumonia due to Methicillin resistant Staphylococcus aureus: Secondary | ICD-10-CM | POA: Diagnosis not present

## 2024-03-04 DIAGNOSIS — I10 Essential (primary) hypertension: Secondary | ICD-10-CM | POA: Diagnosis not present

## 2024-03-04 DIAGNOSIS — E440 Moderate protein-calorie malnutrition: Secondary | ICD-10-CM | POA: Diagnosis not present

## 2024-03-04 DIAGNOSIS — J9601 Acute respiratory failure with hypoxia: Secondary | ICD-10-CM | POA: Diagnosis not present

## 2024-03-04 DIAGNOSIS — R1310 Dysphagia, unspecified: Secondary | ICD-10-CM | POA: Diagnosis not present

## 2024-03-04 LAB — GLUCOSE, CAPILLARY
Glucose-Capillary: 110 mg/dL — ABNORMAL HIGH (ref 70–99)
Glucose-Capillary: 129 mg/dL — ABNORMAL HIGH (ref 70–99)
Glucose-Capillary: 134 mg/dL — ABNORMAL HIGH (ref 70–99)
Glucose-Capillary: 145 mg/dL — ABNORMAL HIGH (ref 70–99)
Glucose-Capillary: 147 mg/dL — ABNORMAL HIGH (ref 70–99)
Glucose-Capillary: 165 mg/dL — ABNORMAL HIGH (ref 70–99)

## 2024-03-04 LAB — COMPREHENSIVE METABOLIC PANEL WITH GFR
ALT: 87 U/L — ABNORMAL HIGH (ref 0–44)
AST: 25 U/L (ref 15–41)
Albumin: 2.6 g/dL — ABNORMAL LOW (ref 3.5–5.0)
Alkaline Phosphatase: 62 U/L (ref 38–126)
Anion gap: 11 (ref 5–15)
BUN: 28 mg/dL — ABNORMAL HIGH (ref 6–20)
CO2: 27 mmol/L (ref 22–32)
Calcium: 9.4 mg/dL (ref 8.9–10.3)
Chloride: 106 mmol/L (ref 98–111)
Creatinine, Ser: 0.6 mg/dL — ABNORMAL LOW (ref 0.61–1.24)
GFR, Estimated: >60 mL/min (ref >60)
Glucose, Bld: 128 mg/dL — ABNORMAL HIGH (ref 70–99)
Potassium: 3.3 mmol/L — ABNORMAL LOW (ref 3.5–5.1)
Sodium: 144 mmol/L (ref 135–145)
Total Bilirubin: 0.5 mg/dL (ref 0.0–1.2)
Total Protein: 6.8 g/dL (ref 6.5–8.1)

## 2024-03-04 MED ORDER — POTASSIUM CHLORIDE 20 MEQ PO PACK
40.0000 meq | PACK | ORAL | Status: AC
  Administered 2024-03-04 (×2): 40 meq
  Filled 2024-03-04: qty 2

## 2024-03-04 NOTE — Progress Notes (Signed)
 NAME:  Garrett [name], MRN:  [id number], DOB:  May 17, 1976, LOS: 129 ADMISSION DATE:  10/27/2023, CONSULTATION DATE:  10/27/23 REFERRING MD:  Dr Madelyne, CHIEF COMPLAINT:  Found down   History of Present Illness:  48 y/o male with past medical history of hypertension who was found down for unknown downtime by his fiance when she came home from work. Reportedly, having agonal breathing.  He apparently had driven her to work this morning.  He has HTN but never checks his BP and does not follow with MD.  She says you can tell when his BP is really high; his eyes get blood shot and he is sweating.  No h/o seizures.  Besides almost daily [name] and cigarette smoking, she is not aware of any other drugs. Drug screen positive for opioids and he was given several doses of Narcan . Patient found to have:  1. 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 Basal cisterns and fourth ventricle are effaced without hydrocephalus at this time. 2. Trace additional subarachnoid hemorrhage along the left parietal convexity. BP in ED 262/156. His CXR revealed a RUL and perihilar infiltrates suggesting aspiration pneumonia. ED labs also revealed PH 7.169, LA 4.4, CPK 985. Patient was intubated in ED.  Pertinent  Medical History  Hypertension  Significant Hospital Events: Including procedures, antibiotic start and stop dates in addition to other pertinent events   3/8: Intubated in ED, pontine bleed/SAH. CT head 17:09 Acute large hemorrhage 4.1 cm in pons with intraventricular extension without hydrocephalus 3/9: CTA 03:14 AM No change in IPH in pons, similar biparietal [facility] 3/14: Now awake, appears locked in can follow commands with eyes. 3/16 Purposeful with left upper extremity  3/22 tracheostomy placed at bedside, bleeding issues overnight from trach site 3/24 PEG (Dr. Sebastian) 3/24 sputum  culture with MRSA 3/27 vomited. TF on hold. Abd film c/w ileus. Added reglan , got SSE. Did tolerate PSV all day  3/28 BMs x2 after SSE day prior. Added back TFs at 1/2 rated. Tolerated PS 3/29 Tolerated PS  3/31 Tolerating CPAP PS 15/5, TF on hold due to ileus. 4/1: con't to hold tube feeds today, restarting vancomycin  and sending tracheal aspirate as peaks are 37, plat 24, driving 19. Thick secretions. Fever overnight.  4/2: peaks improved. Ongoing hiccups. Trickle feeding. Neuro exam unchanged.  4/20: trach was changed to cuffless #8 4/28: not safe to swallow. Limited communication and he follows simple commands. On TF 5/1: on TC 28%, with large secretions. Working with speech and he is improving to initiate PMV trials under ST supervision   5/5: on TC 30%, 7 L/min. Trach #6 cuffless, changed 5/1 to facilitate PMV trials  5/12: on TC 21%, 6 L/min. Trach #6 cuffless, unable to produce sounds or speak on PMV. No significant resp secretions. On PEG TF 01/29/2024 tracheostomy was removed due to failure to well provide adequate airway 02/15/2024 pulmonary critical care consult. 7/7 patient obtunded and desaturating on NRB; transfer to icu for intubation; updated mother over phone who wants to reverse code status to full code; ent consulted 7/9 Remains intubated on no continuous sedation, right eye open but does not track movement  7/10 sleeping in bed this morning, no acute issues overnight  Interim History / Subjective:  No events overnight Failed SBT 2/2 apnea   Objective    Blood pressure (!) 125/95, pulse 99, temperature 97.9 F (36.6 C), temperature source Axillary, resp.  rate 20, height 5' 8 (1.727 m), weight 97 kg, SpO2 95%.    Vent Mode: PRVC FiO2 (%):  [40 %] 40 % Set Rate:  [20 bmp] 20 bmp Vt Set:  [540 mL] 540 mL PEEP:  [5 cmH20] 5 cmH20 Plateau Pressure:  [21 cmH20-24 cmH20] 24 cmH20   Intake/Output Summary (Last 24 hours) at 03/04/2024 0901 Last data filed at 03/04/2024  0600 Gross per 24 hour  Intake --  Output 650 ml  Net -650 ml   Filed Weights   03/01/24 0342 03/02/24 0413 03/04/24 0500  Weight: 98 kg 97 kg 97 kg    Examination: General:  chronically ill appearing male lying in bed in NAD HEENT: MM pink/moist, R eye- pupil 3/r, left eye patched, remains injected with small amount of greenish drainage, ETT w/biteblock Neuro: opens R eye to verbal, will track today, lightly squeezes hand on left hand/ wiggles toes but no gross movement noted, withdrawals in RLE CV: rr, SR/ ST, no murmur PULM:  MV supported, coarse/ diminished, minimal secretions but was suctioned prior this am by RT, no wheeze GI: soft, bs+, NT, PEG, foley  Extremities: warm/dry, no tibial/ LE edema, BLE foot drop boots Skin: no rashes   99.4 tmax  Labs> K 3.3, renal indices stable, decreasing ALT  UOP 650 ml/ 24hrs Net -2L Last BM 7/13  Resolved problem list  Post sedation hypotension  Assessment and Plan   Acute hypoxic respiratory failure due to aspiration pneumonia w/ MRSA and Klebsiella PNA  -s/p tracheostomy placement 3/22 decannulated on 6/10 due to inability to pass suction catheter; reintubated on 7/7 - Completed 7 days of cefepime  and vancomycin  7/9 Plan P:  -  reported increased secretions and RT reports increased from R nare but remain clear, afebrile, stable WBC> cont to monitor off abx.  Increased consolidation on LLL on CXR 7/14.  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 Coli UTI Discussed potential placement of suprapubic catheter with urology 7/9 recommendations made to establish discharge disposition (patient will likely need to remain inpatient upwards of 6 weeks post procedure ) and family agreeance prior to formal consult.  Called mother 7/9 mother does NOT wish to proceed at this time  - off abx 7/9 - Foley exchanged 7/12, next change due Aug 12   Moderate protein calorie malnutrition Dysphagia plan - cont TF/ EN via PEG per RD - NPO/ aspiration precautions   HTN plan - remains hemodynamically stable - cont Norvasc  5mg  and Lopressor  50mg  BID  Elevated LFTs -improved 7/13 Plan - trend LFTs periodically   Left eye conjunctivitis/ keratitis plan - cont Polytrim  drops x  7 days (started 7/8) - Lacri-Lube to bilateral eyes - left eye patched   Hyperglycemia, resolved - remains controlled on BMET - prn CBG  Macrocytic anemia  Thrombocytosis  - No report of chronic alcohol  use. B12, folate and TSH normal Plan - trend CBC - transfuse for Hgb < 7  Poor Dentition-Upper right teeth and one left lower tooth have chipped off. plan - Eventual dental evaluation  Best Practice (right click and Reselect all SmartList Selections daily)   Diet/type: tubefeeds and NPO w/ meds via tube DVT prophylaxis: LMWH GI prophylaxis: H2B Lines: N/A RUE DL PICC 2/2> Foley:  Yes, and it is still needed Code Status:  full code Last date of multidisciplinary goals of care discussion [7/8 attempted to call mother [place] over phone went straight to voicemail.]  Pending update 7/15, no family at bedside  CCT: 30 mins   Lyle Pesa, MSN,  AG-ACNP-BC [facility] 03/04/2024, 9:01 AM  See Amion for pager If no response to pager , please call [phone] until 7pm After 7:00 pm call Elink  [phone]

## 2024-03-04 NOTE — TOC Progression Note (Addendum)
 Transition of Care [facility]) - Progression Note    Patient Details  Name: Garrett [name] MRN: [id number] Date of Birth: Jun 06, 1976  Transition of Care [facility]) CM/SW Contact  Tom-Johnson, Quinette Hentges Daphne, RN Phone Number: 03/04/2024, 2:34 PM  Clinical Narrative:     Patient is scheduled for Tracheostomy tomorrow 03/04/24 by ENT. Currently Vented. Foley exchanged on 03/01/24, next change due 04/01/24.   Supportive care continues.   Patient not Medically ready for discharge.  CM will continue to follow for discharge needs as patient progresses with care towards discharge.             Expected Discharge Plan: Long Term Nursing Home Barriers to Discharge: Continued Medical Work up, SNF Pending Medicaid, SNF Pending bed offer, SNF Pending payor source - LOG, Inadequate or no insurance (New trach)  Expected Discharge Plan and Services In-house Referral: Clinical Social Work, Hospice / Palliative Care   Post Acute Care Choice: Skilled Nursing Facility Living arrangements for the past 2 months: Single Family Home                                       Social Determinants of Health (SDOH) Interventions SDOH Screenings   Food Insecurity: Patient Unable To Answer (10/29/2023)  Social Connections: Unknown (06/23/2023)   Received from [facility]  Tobacco Use: High Risk (10/31/2023)    Readmission Risk Interventions     No data to display

## 2024-03-04 NOTE — Progress Notes (Signed)
 Physical Therapy Treatment Patient Details Name: Garrett [name] MRN: [id number] DOB: June 11, 1976 Today's Date: 03/04/2024   History of Present Illness Pt is a 48 yo male who was found down 10/27/23 with agonal breathing. 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 Intubated 3/8, cortrak placed 3/14, trach placed 3/22, PEG placed 3/24. De-cannulated 6/10. Pt re-intubed on 02/25/2024. PMH: HTN, smoker    PT Comments  Pt received soiled in stool, dependent for hygiene. Pt continues to maintain R eye opened t/o session, noted attempt to track with R eye however unable to stabilize gaze. Noted constant vertical movements of R eye at rest. Pt with some command follow with gripping with L hand and wiggling L toes but unable to move R UE/LE or initiate rolling/transfers. Pt remains dependent for mobility. Acute PT to cont to monitor progression while in hospital.   If plan is discharge home, recommend the following: Two people to help with walking and/or transfers;Two people to help with bathing/dressing/bathroom;Assistance with cooking/housework;Assistance with feeding;Direct supervision/assist for medications management;Direct supervision/assist for financial management;Assist for transportation;Help with stairs or ramp for entrance;Supervision due to cognitive status   Can travel by private vehicle        Equipment Recommendations  [place] lift;Hospital bed;Wheelchair (measurements PT);Wheelchair cushion (measurements PT);BSC/3in1;Other (comment) (pt would potentially need a custom power wheelchair with either gaze control if consistency of gaze improves, or that can be controlled via switch technology)    Recommendations for Other Services Rehab consult     Precautions / Restrictions Precautions Precautions: Fall Recall of  Precautions/Restrictions: Impaired Precaution/Restrictions Comments: PEG, SBP < 160 Required Braces or Orthoses: Other Brace Splint/Cast: Bil resting hand splints Splint/Cast - Date Prophylactic Dressing Applied (if applicable): 03/03/24 Other Brace: resting hand splints present in room, not on during session Restrictions Weight Bearing Restrictions Per Provider Order: No     Mobility  Bed Mobility Overal bed mobility: Needs Assistance Bed Mobility: Rolling Rolling: +2 for physical assistance, +2 for safety/equipment, Total assist         General bed mobility comments: totalAx2, dependent for hygiene s/p BM, no initiation of task or physical assist from patient    Transfers                   General transfer comment: not appropriate at this time    Ambulation/Gait                   Stairs             Wheelchair Mobility     Tilt Bed    Modified Rankin (Stroke Patients Only) Modified Rankin (Stroke Patients Only) Pre-Morbid Rankin Score: No symptoms Modified Rankin: Severe disability     Balance       Sitting balance - Comments: deferred                                    Communication Communication Communication: Impaired Factors Affecting Communication: Difficulty expressing self;Trach/intubated  Cognition Arousal: Alert Behavior During Therapy: Flat affect   PT - Cognitive impairments: Difficult to assess Difficult to assess due to: Impaired communication                     PT - Cognition Comments: pt with R eye open throughout  session, moves eye vertically but not horizontally. Pt with instability of gaze due to rapid eye movements throughout most of session, although these rapid eye movements do seem to decrease in intensity and frequency with pt attempts to fixate gaze on objects and to follow motor commands, L eye covered by patch Following commands: Impaired Following commands impaired: Follows one  step commands with increased time, Follows one step commands inconsistently (follows simple commands majority of time with L hand and wiggle L toes however no command follow to turning head yes/no or with R UE/LE)    Cueing Cueing Techniques: Verbal cues, Tactile cues, Visual cues  Exercises General Exercises - Lower Extremity Ankle Circles/Pumps: PROM, Both, 5 reps, Seated (grimace with L LE) Long Arc Quad: PROM, Both, 10 reps (chair position in bed) Hip ABduction/ADduction: PROM, Both, 10 reps, Supine Hip Flexion/Marching: PROM, Both, 5 reps, Seated    General Comments General comments (skin integrity, edema, etc.): full pressure vent support, FIO2 at 40% and PEEP of 5      Pertinent Vitals/Pain Pain Assessment Pain Assessment: Faces Faces Pain Scale: No hurt Pain Location: grimacing at times with L LE stretching Pain Descriptors / Indicators: Grimacing Pain Intervention(s): Monitored during session    Home Living                          Prior Function            PT Goals (current goals can now be found in the care plan section) Acute Rehab PT Goals Patient Stated Goal: unable to state, no visitors present PT Goal Formulation: Patient unable to participate in goal setting Time For Goal Achievement: 03/26/24 Potential to Achieve Goals: Poor Progress towards PT goals: Not progressing toward goals - comment    Frequency    Min 1X/week      PT Plan      Co-evaluation              AM-PAC PT 6 Clicks Mobility   Outcome Measure  Help needed turning from your back to your side while in a flat bed without using bedrails?: Total Help needed moving from lying on your back to sitting on the side of a flat bed without using bedrails?: Total Help needed moving to and from a bed to a chair (including a wheelchair)?: Total Help needed standing up from a chair using your arms (e.g., wheelchair or bedside chair)?: Total Help needed to walk in hospital room?:  Total Help needed climbing 3-5 steps with a railing? : Total 6 Click Score: 6    End of Session Equipment Utilized During Treatment: Oxygen Activity Tolerance: Patient tolerated treatment well Patient left: in bed;with call bell/phone within reach;with bed alarm set Nurse Communication: Mobility status;Need for lift equipment PT Visit Diagnosis: Muscle weakness (generalized) (M62.81);Other symptoms and signs involving the nervous system (R29.898);Hemiplegia and hemiparesis Hemiplegia - Right/Left: Right Hemiplegia - dominant/non-dominant: Dominant Hemiplegia - caused by: Nontraumatic intracerebral hemorrhage     Time: 1037-1107 PT Time Calculation (min) (ACUTE ONLY): 30 min  Charges:    $Therapeutic Exercise: 8-22 mins $Therapeutic Activity: 8-22 mins PT General Charges $$ ACUTE PT VISIT: 1 Visit                     Norene Ames, PT, DPT Acute Rehabilitation Services Secure chat preferred Office #: [phone]    Norene CHRISTELLA Ames 03/04/2024, 1:49 PM

## 2024-03-05 ENCOUNTER — Inpatient Hospital Stay (HOSPITAL_COMMUNITY): Payer: Self-pay | Admitting: Anesthesiology

## 2024-03-05 ENCOUNTER — Other Ambulatory Visit: Payer: Self-pay

## 2024-03-05 ENCOUNTER — Encounter (HOSPITAL_COMMUNITY)
Admission: EM | Disposition: A | Discharge status: Nursing Home (Includes ICF) | Payer: Self-pay | Source: Home / Self Care | Attending: Neurology

## 2024-03-05 DIAGNOSIS — I613 Nontraumatic intracerebral hemorrhage in brain stem: Secondary | ICD-10-CM | POA: Diagnosis not present

## 2024-03-05 DIAGNOSIS — J9611 Chronic respiratory failure with hypoxia: Secondary | ICD-10-CM | POA: Diagnosis not present

## 2024-03-05 DIAGNOSIS — E440 Moderate protein-calorie malnutrition: Secondary | ICD-10-CM | POA: Diagnosis not present

## 2024-03-05 DIAGNOSIS — I10 Essential (primary) hypertension: Secondary | ICD-10-CM | POA: Diagnosis not present

## 2024-03-05 DIAGNOSIS — N390 Urinary tract infection, site not specified: Secondary | ICD-10-CM | POA: Diagnosis not present

## 2024-03-05 DIAGNOSIS — J9601 Acute respiratory failure with hypoxia: Secondary | ICD-10-CM | POA: Diagnosis not present

## 2024-03-05 DIAGNOSIS — R1310 Dysphagia, unspecified: Secondary | ICD-10-CM | POA: Diagnosis not present

## 2024-03-05 DIAGNOSIS — J690 Pneumonitis due to inhalation of food and vomit: Secondary | ICD-10-CM | POA: Diagnosis not present

## 2024-03-05 DIAGNOSIS — J15212 Pneumonia due to Methicillin resistant Staphylococcus aureus: Secondary | ICD-10-CM | POA: Diagnosis not present

## 2024-03-05 DIAGNOSIS — D509 Iron deficiency anemia, unspecified: Secondary | ICD-10-CM | POA: Diagnosis not present

## 2024-03-05 HISTORY — PX: TRACHEOSTOMY TUBE PLACEMENT: SHX814

## 2024-03-05 LAB — CBC
HCT: 24.5 % — ABNORMAL LOW (ref 39.0–52.0)
Hemoglobin: 7.2 g/dL — ABNORMAL LOW (ref 13.0–17.0)
MCH: 28.2 pg (ref 26.0–34.0)
MCHC: 29.4 g/dL — ABNORMAL LOW (ref 30.0–36.0)
MCV: 96.1 fL (ref 80.0–100.0)
Platelets: 567 K/uL — ABNORMAL HIGH (ref 150–400)
RBC: 2.55 MIL/uL — ABNORMAL LOW (ref 4.22–5.81)
RDW: 17.8 % — ABNORMAL HIGH (ref 11.5–15.5)
WBC: 9.3 K/uL (ref 4.0–10.5)
nRBC: 0.2 % (ref 0.0–0.2)

## 2024-03-05 LAB — PROTIME-INR
INR: 1.2 (ref 0.8–1.2)
Prothrombin Time: 15.8 s — ABNORMAL HIGH (ref 11.4–15.2)

## 2024-03-05 LAB — GLUCOSE, CAPILLARY
Glucose-Capillary: 103 mg/dL — ABNORMAL HIGH (ref 70–99)
Glucose-Capillary: 106 mg/dL — ABNORMAL HIGH (ref 70–99)
Glucose-Capillary: 110 mg/dL — ABNORMAL HIGH (ref 70–99)
Glucose-Capillary: 111 mg/dL — ABNORMAL HIGH (ref 70–99)
Glucose-Capillary: 124 mg/dL — ABNORMAL HIGH (ref 70–99)
Glucose-Capillary: 132 mg/dL — ABNORMAL HIGH (ref 70–99)
Glucose-Capillary: 161 mg/dL — ABNORMAL HIGH (ref 70–99)

## 2024-03-05 LAB — RENAL FUNCTION PANEL
Albumin: 2.6 g/dL — ABNORMAL LOW (ref 3.5–5.0)
Anion gap: 15 (ref 5–15)
BUN: 27 mg/dL — ABNORMAL HIGH (ref 6–20)
CO2: 25 mmol/L (ref 22–32)
Calcium: 9 mg/dL (ref 8.9–10.3)
Chloride: 105 mmol/L (ref 98–111)
Creatinine, Ser: 0.56 mg/dL — ABNORMAL LOW (ref 0.61–1.24)
GFR, Estimated: >60 mL/min (ref >60)
Glucose, Bld: 106 mg/dL — ABNORMAL HIGH (ref 70–99)
Phosphorus: 3.9 mg/dL (ref 2.5–4.6)
Potassium: 3.9 mmol/L (ref 3.5–5.1)
Sodium: 145 mmol/L (ref 135–145)

## 2024-03-05 LAB — MAGNESIUM: Magnesium: 2.2 mg/dL (ref 1.7–2.4)

## 2024-03-05 SURGERY — CREATION, TRACHEOSTOMY
Anesthesia: General

## 2024-03-05 MED ORDER — PHENYLEPHRINE 80 MCG/ML (10ML) SYRINGE FOR IV PUSH (FOR BLOOD PRESSURE SUPPORT)
PREFILLED_SYRINGE | INTRAVENOUS | Status: DC | PRN
  Administered 2024-03-05: 160 ug via INTRAVENOUS
  Administered 2024-03-05: 80 ug via INTRAVENOUS
  Administered 2024-03-05: 160 ug via INTRAVENOUS

## 2024-03-05 MED ORDER — CEFAZOLIN SODIUM-DEXTROSE 1-4 GM/50ML-% IV SOLN
INTRAVENOUS | Status: DC | PRN
  Administered 2024-03-05: 2 g via INTRAVENOUS

## 2024-03-05 MED ORDER — METOPROLOL TARTRATE 5 MG/5ML IV SOLN
INTRAVENOUS | Status: AC
  Filled 2024-03-05: qty 5

## 2024-03-05 MED ORDER — 0.9 % SODIUM CHLORIDE (POUR BTL) OPTIME
TOPICAL | Status: DC | PRN
  Administered 2024-03-05: 1000 mL

## 2024-03-05 MED ORDER — PHENYLEPHRINE 80 MCG/ML (10ML) SYRINGE FOR IV PUSH (FOR BLOOD PRESSURE SUPPORT)
PREFILLED_SYRINGE | INTRAVENOUS | Status: AC
Start: 2024-03-05 — End: 2024-03-05
  Filled 2024-03-05: qty 10

## 2024-03-05 MED ORDER — LIDOCAINE-EPINEPHRINE (PF) 1 %-1:200000 IJ SOLN
INTRAMUSCULAR | Status: AC
  Filled 2024-03-05: qty 30

## 2024-03-05 MED ORDER — FENTANYL CITRATE (PF) 250 MCG/5ML IJ SOLN
INTRAMUSCULAR | Status: DC | PRN
  Administered 2024-03-05: 50 ug via INTRAVENOUS

## 2024-03-05 MED ORDER — METOPROLOL TARTRATE 5 MG/5ML IV SOLN
INTRAVENOUS | Status: DC | PRN
Start: 2024-03-05 — End: 2024-03-05
  Administered 2024-03-05: 2 mg via INTRAVENOUS

## 2024-03-05 MED ORDER — ROCURONIUM BROMIDE 10 MG/ML (PF) SYRINGE
PREFILLED_SYRINGE | INTRAVENOUS | Status: DC | PRN
  Administered 2024-03-05 (×2): 10 mg via INTRAVENOUS
  Administered 2024-03-05: 40 mg via INTRAVENOUS
  Administered 2024-03-05: 30 mg via INTRAVENOUS

## 2024-03-05 MED ORDER — SODIUM CHLORIDE 0.9 % IV SOLN: INTRAVENOUS | Status: DC | PRN

## 2024-03-05 MED ORDER — DEXAMETHASONE SODIUM PHOSPHATE 10 MG/ML IJ SOLN
INTRAMUSCULAR | Status: DC | PRN
  Administered 2024-03-05: 10 mg via INTRAVENOUS

## 2024-03-05 MED ORDER — PHENYLEPHRINE HCL-NACL 20-0.9 MG/250ML-% IV SOLN
INTRAVENOUS | Status: DC | PRN
  Administered 2024-03-05: 30 ug/min via INTRAVENOUS

## 2024-03-05 MED ORDER — PROPOFOL 10 MG/ML IV BOLUS
INTRAVENOUS | Status: AC
  Filled 2024-03-05: qty 20

## 2024-03-05 MED ORDER — ONDANSETRON HCL 4 MG/2ML IJ SOLN
INTRAMUSCULAR | Status: DC | PRN
  Administered 2024-03-05: 4 mg via INTRAVENOUS

## 2024-03-05 MED ORDER — LIDOCAINE-EPINEPHRINE (PF) 1 %-1:200000 IJ SOLN
INTRAMUSCULAR | Status: DC | PRN
  Administered 2024-03-05: 6 mL

## 2024-03-05 MED ORDER — FENTANYL CITRATE (PF) 250 MCG/5ML IJ SOLN
INTRAMUSCULAR | Status: AC
  Filled 2024-03-05: qty 5

## 2024-03-05 SURGICAL SUPPLY — 37 items
BAG COUNTER SPONGE SURGICOUNT (BAG) ×1 IMPLANT
BENZOIN TINCTURE PRP APPL 2/3 (GAUZE/BANDAGES/DRESSINGS) IMPLANT
BLADE CLIPPER SURG (BLADE) IMPLANT
CANISTER SUCTION 3000ML PPV (SUCTIONS) ×1 IMPLANT
CLEANER TIP ELECTROSURG 2X2 (MISCELLANEOUS) ×1 IMPLANT
CORD BIPOLAR FORCEPS 12FT (ELECTRODE) IMPLANT
COVER SURGICAL LIGHT HANDLE (MISCELLANEOUS) ×1 IMPLANT
DRAPE HALF SHEET 40X57 (DRAPES) IMPLANT
DRSG MEPILEX BORD LITE 2X5 (GAUZE/BANDAGES/DRESSINGS) IMPLANT
ELECT COATED BLADE 2.86 ST (ELECTRODE) ×1 IMPLANT
ELECTRODE REM PT RTRN 9FT ADLT (ELECTROSURGICAL) ×1 IMPLANT
FORCEPS BIPOLAR SPETZLER 8 1.0 (NEUROSURGERY SUPPLIES) IMPLANT
GAUZE 4X4 16PLY ~~LOC~~+RFID DBL (SPONGE) ×1 IMPLANT
GAUZE SPONGE 4X4 12PLY STRL (GAUZE/BANDAGES/DRESSINGS) IMPLANT
GLOVE ECLIPSE 7.5 STRL STRAW (GLOVE) ×1 IMPLANT
GOWN STRL REUS W/ TWL LRG LVL3 (GOWN DISPOSABLE) ×2 IMPLANT
KIT BASIN OR (CUSTOM PROCEDURE TRAY) ×1 IMPLANT
KIT TURNOVER KIT B (KITS) ×1 IMPLANT
NDL PRECISIONGLIDE 27X1.5 (NEEDLE) ×1 IMPLANT
NEEDLE PRECISIONGLIDE 27X1.5 (NEEDLE) ×1 IMPLANT
NS IRRIG 1000ML POUR BTL (IV SOLUTION) ×1 IMPLANT
PAD ARMBOARD POSITIONER FOAM (MISCELLANEOUS) ×2 IMPLANT
PENCIL BUTTON HOLSTER BLD 10FT (ELECTRODE) IMPLANT
PENCIL FOOT CONTROL (ELECTRODE) ×1 IMPLANT
SPIKE FLUID TRANSFER (MISCELLANEOUS) ×1 IMPLANT
SPONGE INTESTINAL PEANUT (DISPOSABLE) IMPLANT
SUT CHROMIC 2 0 SH (SUTURE) ×1 IMPLANT
SUT ETHILON 3 0 PS 1 (SUTURE) ×1 IMPLANT
SUT SILK 4 0 TIE 10X30 (SUTURE) ×1 IMPLANT
SUT SILK 4-0 18XBRD TIE 12 (SUTURE) ×1 IMPLANT
SYR 20ML LL LF (SYRINGE) ×1 IMPLANT
SYR CONTROL 10ML LL (SYRINGE) IMPLANT
TOWEL GREEN STERILE FF (TOWEL DISPOSABLE) ×1 IMPLANT
TRAY ENT MC OR (CUSTOM PROCEDURE TRAY) ×1 IMPLANT
TUBE CONNECTING 12X1/4 (SUCTIONS) ×1 IMPLANT
TUBE TRACH 6.0 CUFF FLEX (MISCELLANEOUS) IMPLANT
WATER STERILE IRR 1000ML POUR (IV SOLUTION) ×1 IMPLANT

## 2024-03-05 NOTE — Progress Notes (Signed)
 eLink Physician-Brief Progress Note Patient Name: Garrett [name] DOB: [birth year] MRN: [id number]   Date of Service  03/05/2024  HPI/Events of Note  Status post revision tracheostomy earlier today  eICU Interventions  Resume tube feeds at previous goal     Intervention Category Minor Interventions: Routine modifications to care plan (e.g. PRN medications for pain, fever)  Karan Ramnauth 03/05/2024, 8:13 PM

## 2024-03-05 NOTE — Anesthesia Preprocedure Evaluation (Addendum)
 Anesthesia Evaluation  Patient identified by MRN, date of birth, ID band Patient unresponsive    Reviewed: Allergy & Precautions, H&P , NPO status , Patient's Chart, lab work & pertinent test results  Airway Mallampati: Intubated       Dental no notable dental hx. (+) Teeth Intact, Dental Advisory Given   Pulmonary Current Smoker   Pulmonary exam normal breath sounds clear to auscultation       Cardiovascular negative cardio ROS  Rhythm:Regular Rate:Normal     Neuro/Psych CVA, Residual Symptoms  negative psych ROS   GI/Hepatic negative GI ROS, Neg liver ROS,,,  Endo/Other  negative endocrine ROS    Renal/GU negative Renal ROS  negative genitourinary   Musculoskeletal   Abdominal   Peds  Hematology negative hematology ROS (+)   Anesthesia Other Findings   Reproductive/Obstetrics negative OB ROS                              Anesthesia Physical Anesthesia Plan  ASA: 4  Anesthesia Plan: General   Post-op Pain Management: Minimal or no pain anticipated   Induction: Intravenous  PONV Risk Score and Plan: 1 and Ondansetron  and Midazolam   Airway Management Planned: Tracheostomy  Additional Equipment:   Intra-op Plan:   Post-operative Plan: Post-operative intubation/ventilation  Informed Consent: I have reviewed the patients History and Physical, chart, labs and discussed the procedure including the risks, benefits and alternatives for the proposed anesthesia with the patient or authorized representative who has indicated his/her understanding and acceptance.     Dental advisory given  Plan Discussed with: CRNA  Anesthesia Plan Comments:          Anesthesia Quick Evaluation

## 2024-03-05 NOTE — Plan of Care (Signed)
  Problem: Intracerebral Hemorrhage Tissue Perfusion: Goal: Complications of Intracerebral Hemorrhage will be minimized Outcome: Progressing   Problem: Nutrition: Goal: Risk of aspiration will decrease Outcome: Progressing Goal: Dietary intake will improve Outcome: Progressing   Problem: Fluid Volume: Goal: Ability to maintain a balanced intake and output will improve Outcome: Progressing   Problem: Skin Integrity: Goal: Risk for impaired skin integrity will decrease Outcome: Progressing   Problem: Tissue Perfusion: Goal: Adequacy of tissue perfusion will improve Outcome: Progressing   Problem: Clinical Measurements: Goal: Ability to maintain clinical measurements within normal limits will improve Outcome: Progressing Goal: Will remain free from infection Outcome: Progressing Goal: Diagnostic test results will improve Outcome: Progressing Goal: Respiratory complications will improve Outcome: Progressing Goal: Cardiovascular complication will be avoided Outcome: Progressing   Problem: Activity: Goal: Risk for activity intolerance will decrease Outcome: Progressing   Problem: Nutrition: Goal: Adequate nutrition will be maintained Outcome: Progressing   Problem: Coping: Goal: Level of anxiety will decrease Outcome: Progressing   Problem: Elimination: Goal: Will not experience complications related to bowel motility Outcome: Progressing Goal: Will not experience complications related to urinary retention Outcome: Progressing   Problem: Pain Managment: Goal: General experience of comfort will improve and/or be controlled Outcome: Progressing   Problem: Safety: Goal: Ability to remain free from injury will improve Outcome: Progressing   Problem: Skin Integrity: Goal: Risk for impaired skin integrity will decrease Outcome: Progressing   Problem: Activity: Goal: Ability to tolerate increased activity will improve Outcome: Progressing   Problem:  Respiratory: Goal: Patent airway maintenance will improve Outcome: Progressing   Problem: Activity: Goal: Ability to tolerate increased activity will improve Outcome: Progressing   Problem: Respiratory: Goal: Ability to maintain a clear airway and adequate ventilation will improve Outcome: Progressing   Problem: Role Relationship: Goal: Method of communication will improve Outcome: Progressing

## 2024-03-05 NOTE — TOC Progression Note (Addendum)
 Transition of Care [facility]) - Progression Note    Patient Details  Name: Garrett [name] MRN: [id number] Date of Birth: 1976-06-18  [facility] of [facility]) CM/SW Contact  [name], [name] Phone Number: 03/05/2024, 2:30 PM  Clinical Narrative:     CSW notified that pt's mother requesting to speak with CSW about disability. CSW called mother who explains she got a call from social security office saying she needs to submit proof of next of kin. CSW directed mother to Lorie with [facility] [phone], [phone] ) as they are the ones that have been assisting with pt's disability application.   Expected Discharge Plan: Long Term Nursing Home Barriers to Discharge: Continued Medical Work up, SNF Pending Medicaid, SNF Pending bed offer, SNF Pending payor source - LOG, Inadequate or no insurance (New trach)  Expected Discharge Plan and Services In-house Referral: Clinical Social Work, Hospice / Palliative Care   Post Acute Care Choice: Skilled Nursing Facility Living arrangements for the past 2 months: Single Family Home                                       Social Determinants of Health (SDOH) Interventions SDOH Screenings   Food Insecurity: Patient Unable To Answer (10/29/2023)  Social Connections: Unknown (06/23/2023)   Received from [facility]  Tobacco Use: High Risk (10/31/2023)    Readmission Risk Interventions     No data to display

## 2024-03-05 NOTE — Transfer of Care (Signed)
 Immediate Anesthesia Transfer of Care Note  Patient: Garrett [name]  Procedure(s) Performed: CREATION, TRACHEOSTOMY  Patient Location: ICU  Anesthesia Type:General  Level of Consciousness: obtunded  Airway & Oxygen Therapy: Patient connected to tracheostomy mask oxygen  Post-op Assessment: Report given to RN and Post -op Vital signs reviewed and stable  Post vital signs: Reviewed and stable  Last Vitals:  Vitals Value Taken Time  BP 130/81   Temp    Pulse 92 SR   Resp VENT   SpO2 100%     Last Pain:  Vitals:   03/05/24 1517  TempSrc: Axillary  PainSc:       Patients Stated Pain Goal: 0 (02/08/24 1131)  Complications: No notable events documented.

## 2024-03-05 NOTE — Progress Notes (Addendum)
 Occupational Therapy Treatment Patient Details Name: Garrett [name] MRN: [id number] DOB: 06/10/1976 Today's Date: 03/05/2024   History of present illness Pt is a 48 yo male who was found down 10/27/23 with agonal breathing. 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 Intubated 3/8, cortrak placed 3/14, trach placed 3/22, PEG placed 3/24. De-cannulated 6/10. Pt re-intubed on 02/25/2024. PMH: HTN, smoker   OT comments  Patient remains intubated, plan per RN for trach this afternoon.  PROM to BUES, pt engaging in AAROM to L UE of elbow, wrist and hand- does not have full ROM, is able to initiate but does not always follow through with movement. Simple commands of push pull hold and drop were helpful.  He is fatigued from full bath prior to OT session.  Continues to bobbing vertical motion of R eye.  Fiance present during session, discussed PROM to Ues, use of splints, and bringing in more familiar objects. Will follow acutely.       If plan is discharge home, recommend the following:  Other (comment) (total care)   Equipment Recommendations  Wheelchair (measurements OT);Wheelchair cushion (measurements OT);Hospital bed;Hoyer lift (defer)    Recommendations for Other Services      Precautions / Restrictions Precautions Precautions: Fall Recall of Precautions/Restrictions: Impaired Precaution/Restrictions Comments: PEG, SBP < 160 Required Braces or Orthoses: Other Brace Splint/Cast: Bil resting hand splints Other Brace: fiance reports he has not worn resting hand splints for several days, but she will pull them out toinght Restrictions Weight Bearing Restrictions Per Provider Order: No       Mobility Bed Mobility                    Transfers                         Balance                                            ADL either performed or assessed with clinical judgement   ADL Overall ADL's : Needs assistance/impaired                                       General ADL Comments: total (A)    Extremity/Trunk Assessment Upper Extremity Assessment Upper Extremity Assessment: RUE deficits/detail;LUE deficits/detail RUE Deficits / Details: edema noted to have some contracture at the MCP with hooking hand position when in neutral position RUE Sensation: decreased proprioception;decreased light touch RUE Coordination: decreased fine motor;decreased gross motor LUE Deficits / Details: decreased grasp strength, pt with activation and intiation noted. pt demonstrates some grasp and release, wrist flexion/extension and elbow flexion/extension; no subluxation and no scapula activation noted LUE Sensation: decreased proprioception LUE Coordination: decreased fine motor;decreased gross motor   Lower Extremity Assessment Lower Extremity Assessment: Defer to PT evaluation        Vision   Vision Assessment?: Vision impaired- to be further tested in functional context Additional Comments: pt with bobbing movement of R eye, seemingly able to fixate on picture but vertical bobbing still present.  minimal movement to L and R today but inconsistent and unable to sustain.  Perception     Praxis     Communication Communication Communication: Impaired Factors Affecting Communication: Difficulty expressing self;Trach/intubated   Cognition Arousal: Alert, Lethargic Behavior During Therapy: Flat affect Cognition: Difficult to assess Difficult to assess due to: Intubated           OT - Cognition Comments: following simple 1 step commands with L UE given delay, mostly motor commands                 Following commands: Impaired Following commands impaired: Follows one step commands with increased time, Follows one step commands inconsistently       Cueing   Cueing Techniques: Verbal cues, Tactile cues, Visual cues  Exercises Exercises: Low Level/ICU General Exercises - Upper Extremity Shoulder Flexion: PROM, Both, 5 reps Elbow Flexion: Both, 10 reps, PROM, AAROM (AAROM L) Elbow Extension: PROM, Both, 10 reps, AAROM (AAROM L) Wrist Flexion: Both, 10 reps, PROM, AAROM (AAROM L) Wrist Extension: PROM, Both, 10 reps, AAROM (AAROM L) Digit Composite Flexion: PROM, Both, 10 reps, AAROM (AAROM L) Composite Extension: PROM, Both, 10 reps Other Exercises Other Exercises: grasp/release of tape in L hand, following command with increased time 4/5 attempts    Shoulder Instructions       General Comments PRVC PEEP 5 40% FiO2    Pertinent Vitals/ Pain       Pain Assessment Pain Assessment: Faces Faces Pain Scale: No hurt Pain Intervention(s): Monitored during session  Home Living                                          Prior Functioning/Environment              Frequency  Min 1X/week        Progress Toward Goals  OT Goals(current goals can now be found in the care plan section)  Progress towards OT goals: Progressing toward goals (slowly)  Acute Rehab OT Goals OT Goal Formulation: Patient unable to participate in goal setting Time For Goal Achievement: 03/12/24 Potential to Achieve Goals: Fair  Plan      Co-evaluation                 AM-PAC OT 6 Clicks Daily Activity     Outcome Measure   Help from another person eating meals?: Total Help from another person taking care of personal grooming?: Total Help from another person toileting, which includes using toliet, bedpan, or urinal?: Total Help from another person bathing (including washing, rinsing, drying)?: Total Help from another person to put on and taking off regular upper body clothing?: Total Help from another person to put on and taking off regular lower body clothing?: Total 6 Click Score: 6    End of Session     OT Visit Diagnosis: Unsteadiness on feet (R26.81);Muscle weakness (generalized) (M62.81) 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

## 2024-03-05 NOTE — Anesthesia Postprocedure Evaluation (Signed)
 Anesthesia Post Note  Patient: Garrett [name]  Procedure(s) Performed: CREATION, TRACHEOSTOMY     Patient location during evaluation: SICU Anesthesia Type: General Level of consciousness: sedated Pain management: pain level controlled Vital Signs Assessment: post-procedure vital signs reviewed and stable Respiratory status: patient remains intubated per anesthesia plan Cardiovascular status: stable Postop Assessment: no apparent nausea or vomiting Anesthetic complications: no   No notable events documented.                Krishiv Sandler,W. EDMOND

## 2024-03-05 NOTE — Op Note (Signed)
 Otolaryngology Operative note  Garrett [name] Date/Time of Admission: 10/27/2023  5:59 PM  CSN: [id number];MRN:[id number]  DOB: 23-Apr-1976 Age: 48 y.o. Location: [facility] OR    Pre-Op Diagnosis: Chronic Hypoxic Respiratory Failure Ventilator Dependence  Post-Op Diagnosis: Same  Procedure: Procedure(s): Revision Tracheostomy (CPT 31600)  Surgeon: Eldora Blanch, MD  Anesthesia type:  General  Anesthesiologist: Anesthesiologist: Epifanio Fallow, MD CRNA: Julien Manus, CRNA   Staff: See Log  Implants: 6 Cuffed Shiley Tracheostomy tube  Specimens: None  EBL: <25cc  Drains: None  Post-op disposition and condition: To ICU, critical condition  Findings:  Complications: None apparent  Indications and consent:  Garrett [name] is a 48 y.o. male with Chronic respiratory failure and prolonged intubation. He had a tracheostomy prior and was decannulated but subsequently required re-intubation with ICU team unable to extubate thereafter. The patient's options were discussed, including risks/benefits/alternatives for each option. Caregiver expressed understanding, and despite these risks, consented and decided to proceed with above procedures. Informed consent was signed before proceeding.  Procedure: After discussing risks, benefits, and alternatives, the patient was brought to the operative suite and placed on the operative table in the supine position.  Anesthesia was induced with continued endotracheal anesthesia.  The anterior neck was prepped and draped in sterile fashion. Neck landmarks were palpated and tracheostomy incision was marked between the thyroid notch and sternal notch over prior incision/scar. The incision site was injected with local anesthetic (1% Lidocaine  with 1:100000 epinephrine ).  The incision was made using a 15 blade scalpel.  Subcutaneous fat was removed using bovie cautery. Dissection proceeded deeper, staying midline, and the strap muscles were  identified and retracted laterally. The thyroid isthmus was slowly divided using bovie cautery. The anterior tracheal wall was identified and soft tissues was swept from the trachea.  The space between the second and third tracheal rings was palpated and a horizontal incision was made in this space with the scalpel and extended to either side with scissors. Two vertical cuts were also made to create a Bjork flap which was secured to the anterior neck skin using two half horizontal mattress 3-0 vicryl sutures.  A 2-0 silk superior stay suture was placed around the ring above the trach site.  A 6-0 cuffed tracheostomy was placed and the inner cannula was inserted.  The anesthesia circuit was attached and the patient was successfully ventilated.  The stay suture was tied with two knots above the trach site. A mepilex dressing was placed and the dressing and the trach flange were secured to the neck skin using 2-0 silk in four quadrants. A trach collar was also applied. The skin was cleaned off, and care was then returned to anesthesia and patient moved to the intensive care unit.   Plan: - Recommend CXR in ICU - Change dressing as needed; some bloody drainage expected from tracheostomy site - Flange sutures to stay in place until approximately 7 days. - Ok for remainder of care by ICU nursing team  Eldora KATHEE Blanch

## 2024-03-05 NOTE — H&P (Addendum)
 Pre-Operative H&P - Day Of Surgery Patient Name: Garrett [name] Date:   03/05/2024  HPI: Garrett [name] is a 48 y.o. male who presents today for operative treatment of chronic respiratory failure, vent dependence.No recent significant changes to health or significant new medications or physiologic change in condition which would immediately impact plans. No new types of therapy has been initiated that would change the plan or the appropriateness of the plan.   ROS:  A complete review of systems was unable to be obtained given mental status  PMH:  History reviewed. No pertinent past medical history.  PSH:  Past Surgical History:  Procedure Laterality Date   IR REPLACE G-TUBE SIMPLE WO FLUORO  12/30/2023    MEDS:   Current Facility-Administered Medications:    acetaminophen  (TYLENOL ) tablet 650 mg, 650 mg, Per Tube, Q4H PRN, 650 mg at 02/28/24 1532 **OR** acetaminophen  (TYLENOL ) 160 MG/5ML solution 650 mg, 650 mg, Per Tube, Q4H PRN, 650 mg at 03/02/24 2131 **OR** acetaminophen  (TYLENOL ) suppository 650 mg, 650 mg, Rectal, Q4H PRN, Regalado, Belkys A, MD, 650 mg at 02/26/24 0143   amLODipine  (NORVASC ) tablet 5 mg, 5 mg, Per Tube, Daily, Payne, John D, PA-C, 5 mg at 03/05/24 1040   artificial tears (LACRILUBE) ophthalmic ointment, , Both Eyes, Q8H, Harris, Whitney D, NP, Given at 03/05/24 1450   bisacodyl  (DULCOLAX) suppository 10 mg, 10 mg, Rectal, Daily PRN, Regalado, Belkys A, MD, 10 mg at 12/29/23 1057   Chlorhexidine  Gluconate Cloth 2 % PADS 6 each, 6 each, Topical, Daily, Regalado, Belkys A, MD, 6 each at 03/05/24 1041   docusate (COLACE) 50 MG/5ML liquid 100 mg, 100 mg, Per Tube, BID, Payne, John D, PA-C, 100 mg at 03/05/24 1040   [START ON 03/06/2024] enoxaparin  (LOVENOX ) injection 40 mg, 40 mg, Subcutaneous, Daily, Pham, Minh Q, RPH-CPP   famotidine  (PEPCID ) tablet 20 mg, 20 mg, Per Tube, BID, Merilee Linsey I, RPH, 20 mg at 03/05/24 1040   feeding supplement (OSMOLITE 1.5 CAL)  liquid 1,000 mL, 1,000 mL, Per Tube, Continuous, Ramaswamy, Murali, MD, Stopped at 03/05/24 0701   feeding supplement (PROSource TF20) liquid 60 mL, 60 mL, Per Tube, BID, Geronimo Amel, MD, 60 mL at 03/05/24 1040   fentaNYL  (SUBLIMAZE ) injection 50 mcg, 50 mcg, Intravenous, Q15 min PRN, Emilio, John D, PA-C   fentaNYL  (SUBLIMAZE ) injection 50-200 mcg, 50-200 mcg, Intravenous, Q30 min PRN, Payne, John D, PA-C, 50 mcg at 02/25/24 1740   fiber supplement (BANATROL TF) liquid 60 mL, 60 mL, Per Tube, BID, Regalado, Belkys A, MD, 60 mL at 03/04/24 2222   Gerhardt's butt cream, , Topical, PRN, Regalado, Belkys A, MD, Given at 02/18/24 2132   hydrALAZINE  (APRESOLINE ) injection 10-40 mg, 10-40 mg, Intravenous, Q4H PRN, Emilio, John D, PA-C   ipratropium-albuterol  (DUONEB) 0.5-2.5 (3) MG/3ML nebulizer solution 3 mL, 3 mL, Nebulization, Q4H PRN, Odell Celinda Balo, MD, 3 mL at 02/25/24 1311   metoprolol  tartrate (LOPRESSOR ) tablet 50 mg, 50 mg, Per Tube, BID, Payne, John D, PA-C, 50 mg at 03/05/24 1040   multivitamin with minerals tablet 1 tablet, 1 tablet, Per Tube, Daily, Regalado, Belkys A, MD, 1 tablet at 03/05/24 1041   Oral care mouth rinse, 15 mL, Mouth Rinse, Q2H, Payne, John D, PA-C, 15 mL at 03/05/24 1449   Oral care mouth rinse, 15 mL, Mouth Rinse, PRN, Payne, John D, PA-C   polyethylene glycol (MIRALAX  / GLYCOLAX ) packet 17 g, 17 g, Per Tube, BID, Jude Harden GAILS, MD, 17 g at 03/05/24 1040  polyvinyl alcohol  (LIQUIFILM TEARS) 1.4 % ophthalmic solution 1 drop, 1 drop, Both Eyes, PRN, Regalado, Belkys A, MD, 1 drop at 02/21/24 1330   sodium chloride  flush (NS) 0.9 % injection 10-40 mL, 10-40 mL, Intracatheter, Q12H, Ramaswamy, Murali, MD, 10 mL at 03/05/24 1042   sodium chloride  flush (NS) 0.9 % injection 10-40 mL, 10-40 mL, Intracatheter, PRN, Geronimo Amel, MD   traZODone  (DESYREL ) tablet 50 mg, 50 mg, Per Tube, QHS PRN, Harris, Whitney D, NP  ALLERGIES: Tomato  EXAM: Vitals: BP (!)  116/95   Pulse 87   Temp 99 F (37.2 C) (Axillary)   Resp 20   Ht 5' 8 (1.727 m)   Wt 97 kg   SpO2 100%   BMI 32.52 kg/m   General Intubated, sedated  HEENT Globe position appears normal. External ears  normal. Nose patent without rhinorrhea. No lymphadenopathy  Cardiovascular No cyanosis.  Pulmonary Intubated, on ventilator  Neuro Follows some commands  Psychiatry deferred  Skin Prior tracheostomy scar appreciable  Extermities Warm, well perfused  Other Findings None   Assessment & Plan: Versie has diagnoses of chronic respiratory failure, vent dependence and will go to the OR today for revision tracheostomy. Informed consent was obtained and available in EMR today. All questions have been answered, and risks/benefits/alternatives of procedure as noted in the consent were discussed in a quiet area. Questions were invited and answered. The caregiver (mother) expressed understanding, provided consent and wished to proceed despite risks.  We again discussed risks including bleeding, infection, wound dehiscence and complications including skin breakdown, false passage, injury to innominate artery, pneumothorax and pulmonary complications, need for permanent tracheostomy and death due to cardiopulmonary compromise given his critical condition. Caregiver expressed understanding and wished to proceed despite risks   Garrett [name] 03/05/2024 3:32 PM

## 2024-03-05 NOTE — Progress Notes (Signed)
 NAME:  Garrett [name], MRN:  [id number], DOB:  [birth year], LOS: 130 ADMISSION DATE:  10/27/2023, CONSULTATION DATE:  10/27/23 REFERRING MD:  Dr Madelyne, CHIEF COMPLAINT:  Found down   History of Present Illness:  48 y/o male with past medical history of hypertension who was found down for unknown downtime by his fiance when she came home from work. Reportedly, having agonal breathing.  He apparently had driven her to work this morning.  He has HTN but never checks his BP and does not follow with MD.  She says you can tell when his BP is really high; his eyes get blood shot and he is sweating.  No h/o seizures.  Besides almost daily [name] and cigarette smoking, she is not aware of any other drugs. Drug screen positive for opioids and he was given several doses of Narcan . Patient found to have:  1. 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 Basal cisterns and fourth ventricle are effaced without hydrocephalus at this time. 2. Trace additional subarachnoid hemorrhage along the left parietal convexity. BP in ED 262/156. His CXR revealed a RUL and perihilar infiltrates suggesting aspiration pneumonia. ED labs also revealed PH 7.169, LA 4.4, CPK 985. Patient was intubated in ED.  Pertinent  Medical History  Hypertension  Significant Hospital Events: Including procedures, antibiotic start and stop dates in addition to other pertinent events   3/8: Intubated in ED, pontine bleed/SAH. CT head 17:09 Acute large hemorrhage 4.1 cm in pons with intraventricular extension without hydrocephalus 3/9: CTA 03:14 AM No change in IPH in pons, similar biparietal [facility] 3/14: Now awake, appears locked in can follow commands with eyes. 3/16 Purposeful with left upper extremity  3/22 tracheostomy placed at bedside, bleeding issues overnight from trach site 3/24 PEG (Dr. Sebastian) 3/24 sputum  culture with MRSA 3/27 vomited. TF on hold. Abd film c/w ileus. Added reglan , got SSE. Did tolerate PSV all day  3/28 BMs x2 after SSE day prior. Added back TFs at 1/2 rated. Tolerated PS 3/29 Tolerated PS  3/31 Tolerating CPAP PS 15/5, TF on hold due to ileus. 4/1: con't to hold tube feeds today, restarting vancomycin  and sending tracheal aspirate as peaks are 37, plat 24, driving 19. Thick secretions. Fever overnight.  4/2: peaks improved. Ongoing hiccups. Trickle feeding. Neuro exam unchanged.  4/20: trach was changed to cuffless #8 4/28: not safe to swallow. Limited communication and he follows simple commands. On TF 5/1: on TC 28%, with large secretions. Working with speech and he is improving to initiate PMV trials under ST supervision   5/5: on TC 30%, 7 L/min. Trach #6 cuffless, changed 5/1 to facilitate PMV trials  5/12: on TC 21%, 6 L/min. Trach #6 cuffless, unable to produce sounds or speak on PMV. No significant resp secretions. On PEG TF 01/29/2024 tracheostomy was removed due to failure to well provide adequate airway 02/15/2024 pulmonary critical care consult. 7/7 patient obtunded and desaturating on NRB; transfer to icu for intubation; updated mother over phone who wants to reverse code status to full code; ent consulted 7/9 Remains intubated on no continuous sedation, right eye open but does not track movement  7/10 sleeping in bed this morning, no acute issues overnight  Interim History / Subjective:   No acute events.  Remains on the ventilator  Objective    Blood pressure 100/84, pulse 93, temperature 98.7 F (37.1 C), temperature source Axillary, resp.  rate 20, height 5' 8 (1.727 m), weight 97 kg, SpO2 99%.    Vent Mode: PRVC FiO2 (%):  [40 %] 40 % Set Rate:  [20 bmp] 20 bmp Vt Set:  [540 mL] 540 mL PEEP:  [5 cmH20] 5 cmH20 Plateau Pressure:  [22 cmH20-23 cmH20] 23 cmH20   Intake/Output Summary (Last 24 hours) at 03/05/2024 9095 Last data filed at 03/05/2024  0400 Gross per 24 hour  Intake 815 ml  Output 625 ml  Net 190 ml   Filed Weights   03/01/24 0342 03/02/24 0413 03/04/24 0500  Weight: 98 kg 97 kg 97 kg    Examination: Gen:      No acute distress HEENT:  EOMI, sclera anicteric, ET tube, left eyepatch Neck:     No masses; no thyromegaly Lungs:    Clear to auscultation bilaterally; normal respiratory effort CV:         Regular rate and rhythm; no murmurs Abd:      + bowel sounds; soft, non-tender; no palpable masses, no distension Ext:    No edema; adequate peripheral perfusion Neuro: alert and oriented x 3 Psych: normal mood and affect   Lab/imaging reviewed Significant for BUN/creatinine 27/0.56 Hemoglobin 7.2, platelets 567  Resolved problem list  Post sedation hypotension  Assessment and Plan   Acute hypoxic respiratory failure due to aspiration pneumonia w/ MRSA and Klebsiella PNA  -s/p tracheostomy placement 3/22 decannulated on 6/10 due to inability to pass suction catheter; reintubated on 7/7 - Completed 7 days of cefepime  and vancomycin  7/9 Plan P:  -  cont full MV support, 4-8cc/kg IBW with goal Pplat <30 and DP<15  - cont to fail SBT due to apnea> check VBG to ensure not over ventilating pt - VAP prevention protocol/ H2B - PAD protocol for sedation> prn fentanyl  w/ bowel regimen - intermittent CXR - wean FiO2 as able for SpO2 >92%  - aggressive pulmonary hygiene - prn duonebs  - plans for redo tracheostomy per ENT plan for today  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 Coli UTI Discussed potential placement of suprapubic catheter with urology 7/9 recommendations made to establish discharge disposition (patient will likely need to remain inpatient upwards of 6 weeks post procedure ) and family agreeance prior to formal consult.  Called mother 7/9 mother does NOT wish  to proceed at this time  - off abx 7/9 - Foley exchanged 7/12, next change due Aug 12   Moderate protein calorie malnutrition Dysphagia plan - cont TF/ EN via PEG per RD - NPO/ aspiration precautions   HTN plan - remains hemodynamically stable - cont Norvasc  5mg  and Lopressor  50mg  BID  Elevated LFTs -improved 7/13 Plan - trend LFTs periodically   Left eye conjunctivitis plan - cont Polytrim  drops x 7 days (started 7/8) - Lacri-Lube to bilateral eyes  Hyperglycemia, resolved - remains controlled on BMET - prn CBG  Macrocytic anemia  Thrombocytosis  - No report of chronic alcohol  use. B12, folate and TSH normal Plan - stable H/H, trend CBC - transfuse for Hgb < 7  Poor Dentition-Upper right teeth and one left lower tooth have chipped off. plan - Eventual dental evaluation  Best Practice (right click and Reselect all SmartList Selections daily)   Diet/type: tubefeeds and NPO w/ meds via tube DVT prophylaxis: LMWH GI prophylaxis: H2B Lines: N/A RUE DL PICC  7/7> Foley:  Yes, and it is still needed Code Status:  full code Last date of multidisciplinary goals of care discussion [7/8 attempted to call mother [place] over phone went straight to voicemail.]  Pending update 7/14, no family at bedside.   The patient is critically ill with multiple organ system failure and requires high complexity decision making for assessment and support, frequent evaluation and titration of therapies, advanced monitoring, review of radiographic studies and interpretation of complex data.   Critical Care Time devoted to patient care services, exclusive of separately billable procedures, described in this note is 35 minutes.   Ireland Chagnon MD [facility] See Amion for pager  If no response to pager , please call [phone] until 7pm After 7:00 pm call Elink  [phone] 03/05/2024, 9:04 AM

## 2024-03-06 ENCOUNTER — Encounter (HOSPITAL_COMMUNITY): Payer: Self-pay | Admitting: Otolaryngology

## 2024-03-06 ENCOUNTER — Inpatient Hospital Stay (HOSPITAL_COMMUNITY)

## 2024-03-06 DIAGNOSIS — N390 Urinary tract infection, site not specified: Secondary | ICD-10-CM | POA: Diagnosis not present

## 2024-03-06 DIAGNOSIS — I10 Essential (primary) hypertension: Secondary | ICD-10-CM | POA: Diagnosis not present

## 2024-03-06 DIAGNOSIS — I613 Nontraumatic intracerebral hemorrhage in brain stem: Secondary | ICD-10-CM | POA: Diagnosis not present

## 2024-03-06 DIAGNOSIS — J9601 Acute respiratory failure with hypoxia: Secondary | ICD-10-CM | POA: Diagnosis not present

## 2024-03-06 DIAGNOSIS — J690 Pneumonitis due to inhalation of food and vomit: Secondary | ICD-10-CM | POA: Diagnosis not present

## 2024-03-06 DIAGNOSIS — J9600 Acute respiratory failure, unspecified whether with hypoxia or hypercapnia: Secondary | ICD-10-CM

## 2024-03-06 DIAGNOSIS — R1310 Dysphagia, unspecified: Secondary | ICD-10-CM | POA: Diagnosis not present

## 2024-03-06 DIAGNOSIS — D509 Iron deficiency anemia, unspecified: Secondary | ICD-10-CM | POA: Diagnosis not present

## 2024-03-06 DIAGNOSIS — J15212 Pneumonia due to Methicillin resistant Staphylococcus aureus: Secondary | ICD-10-CM | POA: Diagnosis not present

## 2024-03-06 DIAGNOSIS — E440 Moderate protein-calorie malnutrition: Secondary | ICD-10-CM | POA: Diagnosis not present

## 2024-03-06 LAB — GLUCOSE, CAPILLARY
Glucose-Capillary: 163 mg/dL — ABNORMAL HIGH (ref 70–99)
Glucose-Capillary: 146 mg/dL — ABNORMAL HIGH (ref 70–99)
Glucose-Capillary: 143 mg/dL — ABNORMAL HIGH (ref 70–99)
Glucose-Capillary: 117 mg/dL — ABNORMAL HIGH (ref 70–99)

## 2024-03-06 LAB — CBC
HCT: 25.9 % — ABNORMAL LOW (ref 39.0–52.0)
Hemoglobin: 7.7 g/dL — ABNORMAL LOW (ref 13.0–17.0)
MCH: 28.7 pg (ref 26.0–34.0)
MCHC: 29.7 g/dL — ABNORMAL LOW (ref 30.0–36.0)
MCV: 96.6 fL (ref 80.0–100.0)
Platelets: 645 K/uL — ABNORMAL HIGH (ref 150–400)
RBC: 2.68 MIL/uL — ABNORMAL LOW (ref 4.22–5.81)
RDW: 18.1 % — ABNORMAL HIGH (ref 11.5–15.5)
WBC: 13 K/uL — ABNORMAL HIGH (ref 4.0–10.5)
nRBC: 0 % (ref 0.0–0.2)

## 2024-03-06 LAB — RENAL FUNCTION PANEL
Albumin: 2.7 g/dL — ABNORMAL LOW (ref 3.5–5.0)
Anion gap: 9 (ref 5–15)
BUN: 29 mg/dL — ABNORMAL HIGH (ref 6–20)
CO2: 27 mmol/L (ref 22–32)
Calcium: 8.9 mg/dL (ref 8.9–10.3)
Chloride: 106 mmol/L (ref 98–111)
Creatinine, Ser: 0.61 mg/dL (ref 0.61–1.24)
GFR, Estimated: >60 mL/min (ref >60)
Glucose, Bld: 153 mg/dL — ABNORMAL HIGH (ref 70–99)
Phosphorus: 4.3 mg/dL (ref 2.5–4.6)
Potassium: 4.2 mmol/L (ref 3.5–5.1)
Sodium: 142 mmol/L (ref 135–145)

## 2024-03-06 NOTE — Plan of Care (Signed)
  Problem: Nutrition: Goal: Risk of aspiration will decrease Outcome: Progressing Goal: Dietary intake will improve Outcome: Progressing   Problem: Clinical Measurements: Goal: Will remain free from infection Outcome: Progressing   Problem: Nutrition: Goal: Adequate nutrition will be maintained Outcome: Progressing   Problem: Pain Managment: Goal: General experience of comfort will improve and/or be controlled Outcome: Progressing

## 2024-03-06 NOTE — Progress Notes (Signed)
 NAME:  Garrett [name], MRN:  [id number], DOB:  1976/01/26, LOS: 131 ADMISSION DATE:  10/27/2023, CONSULTATION DATE:  10/27/23 REFERRING MD:  Dr Madelyne, CHIEF COMPLAINT:  Found down   History of Present Illness:  48 y/o male with past medical history of hypertension who was found down for unknown downtime by his fiance when she came home from work. Reportedly, having agonal breathing.  He apparently had driven her to work this morning.  He has HTN but never checks his BP and does not follow with MD.  She says you can tell when his BP is really high; his eyes get blood shot and he is sweating.  No h/o seizures.  Besides almost daily [name] and cigarette smoking, she is not aware of any other drugs. Drug screen positive for opioids and he was given several doses of Narcan . Patient found to have:  1. 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 Basal cisterns and fourth ventricle are effaced without hydrocephalus at this time. 2. Trace additional subarachnoid hemorrhage along the left parietal convexity. BP in ED 262/156. His CXR revealed a RUL and perihilar infiltrates suggesting aspiration pneumonia. ED labs also revealed PH 7.169, LA 4.4, CPK 985. Patient was intubated in ED.  Pertinent  Medical History  Hypertension  Significant Hospital Events: Including procedures, antibiotic start and stop dates in addition to other pertinent events   3/8: Intubated in ED, pontine bleed/SAH. CT head 17:09 Acute large hemorrhage 4.1 cm in pons with intraventricular extension without hydrocephalus 3/9: CTA 03:14 AM No change in IPH in pons, similar biparietal [facility] 3/14: Now awake, appears locked in can follow commands with eyes. 3/16 Purposeful with left upper extremity  3/22 tracheostomy placed at bedside, bleeding issues overnight from trach site 3/24 PEG (Dr. Sebastian) 3/24 sputum  culture with MRSA 3/27 vomited. TF on hold. Abd film c/w ileus. Added reglan , got SSE. Did tolerate PSV all day  3/28 BMs x2 after SSE day prior. Added back TFs at 1/2 rated. Tolerated PS 3/29 Tolerated PS  3/31 Tolerating CPAP PS 15/5, TF on hold due to ileus. 4/1: con't to hold tube feeds today, restarting vancomycin  and sending tracheal aspirate as peaks are 37, plat 24, driving 19. Thick secretions. Fever overnight.  4/2: peaks improved. Ongoing hiccups. Trickle feeding. Neuro exam unchanged.  4/20: trach was changed to cuffless #8 4/28: not safe to swallow. Limited communication and he follows simple commands. On TF 5/1: on TC 28%, with large secretions. Working with speech and he is improving to initiate PMV trials under ST supervision   5/5: on TC 30%, 7 L/min. Trach #6 cuffless, changed 5/1 to facilitate PMV trials  5/12: on TC 21%, 6 L/min. Trach #6 cuffless, unable to produce sounds or speak on PMV. No significant resp secretions. On PEG TF 01/29/2024 tracheostomy was removed due to failure to well provide adequate airway 02/15/2024 pulmonary critical care consult. 7/7 patient obtunded and desaturating on NRB; transfer to icu for intubation; updated mother over phone who wants to reverse code status to full code; ent consulted 7/9 Remains intubated on no continuous sedation, right eye open but does not track movement  7/16 ENT trach   Interim History / Subjective:  Re-do trach yesterday  Did well overnight  Failed SBT this am with RR 40s despite PS 20/5 Otherwise no acute change, no events overnight   Objective    Blood pressure 111/87,  pulse 96, temperature 98.1 F (36.7 C), temperature source Axillary, resp. rate 20, height 5' 8 (1.727 m), weight 97 kg, SpO2 100%.    Vent Mode: PRVC FiO2 (%):  [40 %] 40 % Set Rate:  [20 bmp] 20 bmp Vt Set:  [540 mL] 540 mL PEEP:  [5 cmH20] 5 cmH20 Plateau Pressure:  [16 cmH20-23 cmH20] 22 cmH20   Intake/Output Summary (Last 24 hours) at  03/06/2024 0920 Last data filed at 03/06/2024 0403 Gross per 24 hour  Intake 855.92 ml  Output 390 ml  Net 465.92 ml   Filed Weights   03/02/24 0413 03/04/24 0500 03/06/24 0500  Weight: 97 kg 97 kg 97 kg    Examination: Gen:     Chronically ill appearing young male, NAD  HEENT:  mm moist, trach c/d, left eyepatch, R eye nystagmus, does not track for me Neck:     No masses; no thyromegaly, trach Lungs:   resps even non labored on vent, clear  CV:         Regular rate and rhythm; no murmurs Abd:      + bowel sounds; soft, non-tender; no palpable masses, no distension Ext:    No edema; adequate peripheral perfusion Neuro:  awake, eyes open, does not follow commands for me but intermittently gives R thumbs up per RN  Lab/imaging reviewed Significant for BUN/creatinine 27/0.56 Hemoglobin 7.2, platelets 567  Resolved problem list  Post sedation hypotension Left eye conjunctivitis - resolved   Assessment and Plan   Acute hypoxic respiratory failure due to aspiration pneumonia w/ MRSA and Klebsiella PNA  -s/p tracheostomy placement 3/22 decannulated on 6/10 due to inability to pass suction catheter; reintubated on 7/7, re-do trach 7/16 - Completed 7 days of cefepime  and vancomycin  7/9 Plan P:  -Vent support - 8cc/kg  -F/u CXR  -F/u ABG PRN - VAP prevention protocol/ H2B - PAD protocol for sedation> prn fentanyl  w/ bowel regimen - intermittent CXR - wean FiO2 as able for SpO2 >92%  - aggressive pulmonary hygiene - prn duonebs - trach care  - ongoing vent weaning attempts   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 Coli UTI Discussed potential placement of suprapubic catheter with urology 7/9 recommendations made to establish discharge disposition (patient will likely need to remain inpatient upwards of 6 weeks post procedure ) and  family agreeance prior to formal consult.  Called mother 7/9 mother does NOT wish to proceed at this time  - off abx 7/9 - Foley exchanged 7/12, next change due Aug 12   Moderate protein calorie malnutrition Dysphagia plan - cont TF/ EN via PEG per RD - NPO/ aspiration precautions  - bowel regimen   HTN plan - remains hemodynamically stable - cont Norvasc  5mg  and Lopressor  50mg  BID  Elevated LFTs -improved 7/13 Plan - trend LFTs periodically   Hyperglycemia, resolved - remains controlled on BMET - prn CBG  Macrocytic anemia  Thrombocytosis  - No report of chronic alcohol  use. B12, folate and TSH normal Plan - no s/s bleeding, stable H/H, trend CBC - transfuse for Hgb < 7  Poor Dentition-Upper right teeth and one left lower tooth have chipped off. plan - Eventual dental evaluation  Best Practice (right click and Reselect all SmartList Selections daily)   Diet/type: tubefeeds and NPO w/ meds via tube DVT prophylaxis: LMWH GI prophylaxis: H2B  Lines: N/A and yes and it is still needed RUE DL PICC 2/2> Foley:  Yes, and it is still needed Code Status:  full code Last date of multidisciplinary goals of care discussion 7/8  Pending update 7/17, no family at bedside.   The patient is critically ill with multiple organ system failure and requires high complexity decision making for assessment and support, frequent evaluation and titration of therapies, advanced monitoring, review of radiographic studies and interpretation of complex data.   Critical Care Time devoted to patient care services, exclusive of separately billable procedures, described in this note is 32 minutes.   Rockie Myers, NP Pulmonary/Critical Care Medicine  03/06/2024  9:20 AM   See Tracey for personal pager PCCM on call pager [phone] until 7pm. Please call Elink 7p-7a. [phone]

## 2024-03-07 ENCOUNTER — Inpatient Hospital Stay (HOSPITAL_COMMUNITY)

## 2024-03-07 DIAGNOSIS — J9601 Acute respiratory failure with hypoxia: Secondary | ICD-10-CM | POA: Diagnosis not present

## 2024-03-07 DIAGNOSIS — J690 Pneumonitis due to inhalation of food and vomit: Secondary | ICD-10-CM | POA: Diagnosis not present

## 2024-03-07 DIAGNOSIS — A419 Sepsis, unspecified organism: Secondary | ICD-10-CM | POA: Diagnosis not present

## 2024-03-07 DIAGNOSIS — Z930 Tracheostomy status: Secondary | ICD-10-CM | POA: Diagnosis not present

## 2024-03-07 DIAGNOSIS — R1310 Dysphagia, unspecified: Secondary | ICD-10-CM | POA: Diagnosis not present

## 2024-03-07 DIAGNOSIS — E440 Moderate protein-calorie malnutrition: Secondary | ICD-10-CM | POA: Diagnosis not present

## 2024-03-07 DIAGNOSIS — I10 Essential (primary) hypertension: Secondary | ICD-10-CM | POA: Diagnosis not present

## 2024-03-07 DIAGNOSIS — I613 Nontraumatic intracerebral hemorrhage in brain stem: Secondary | ICD-10-CM | POA: Diagnosis not present

## 2024-03-07 LAB — GLUCOSE, CAPILLARY: Glucose-Capillary: 117 mg/dL — ABNORMAL HIGH (ref 70–99)

## 2024-03-07 MED ORDER — JEVITY 1.5 CAL/FIBER PO LIQD
1000.0000 mL | ORAL | Status: DC
  Administered 2024-03-08 – 2024-03-17 (×12): 1000 mL
  Filled 2024-03-07 (×14): qty 1000

## 2024-03-07 NOTE — Plan of Care (Signed)
  Problem: Fluid Volume: Goal: Ability to maintain a balanced intake and output will improve Outcome: Progressing   Problem: Skin Integrity: Goal: Risk for impaired skin integrity will decrease Outcome: Progressing   Problem: Tissue Perfusion: Goal: Adequacy of tissue perfusion will improve Outcome: Progressing   Problem: Clinical Measurements: Goal: Ability to maintain clinical measurements within normal limits will improve Outcome: Progressing Goal: Will remain free from infection Outcome: Progressing Goal: Diagnostic test results will improve Outcome: Progressing Goal: Respiratory complications will improve Outcome: Progressing

## 2024-03-07 NOTE — Progress Notes (Addendum)
 ENT CONSULT:  Reason for Consult: Tracheostomy   Referring Physician: ICU team   HPI: Garrett [name] is an 48 y.o. 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   Patient was intubated on 10/27/2023 for respiratory failure and underwent trach on 11/10/2023 with trach 12/09/2023 to cuffless and was tolerating trach collar. On ~01/29/2024, eventually tracheostomy was removed due to concerns for accidental decannulation and as such thought did not provide adequate airway. Patient then did somewhat well without vent support with concern for respiratory failure, aspiration and subsequently was re-intubated 02/25/2024. ENT was then consulted for tracheostomy Patient unable to provide history due to intubation/mental status.   --------------------------------------------------------- 03/07/2024: Seen after tracheostomy on 03/05/2024. No issues with tracheostomy including suctioning or bleeding per nursing staff. On vent.    PMHx: Reviewed    Past Surgical History:  Procedure Laterality Date   IR REPLACE G-TUBE SIMPLE WO FLUORO  12/30/2023   TRACHEOSTOMY TUBE PLACEMENT N/A 03/05/2024   Procedure: CREATION, TRACHEOSTOMY;  Surgeon: Tobie Eldora NOVAK, MD;  Location: [facility] OR;  Service: ENT;  Laterality: N/A;    History reviewed. No pertinent family history.  Social History:  reports that he has been smoking cigarettes. He started smoking about 30 years ago. He has a 15.3 pack-year smoking history. He has never used smokeless tobacco. He reports that he does not currently use alcohol . He reports that he does not use drugs.  Allergies:  Allergies  Allergen Reactions   Tomato Hives, Itching and Rash    Medications: I have reviewed the patient's current medications.  Results for orders placed or performed during the hospital encounter of 10/27/23 (from the past 48 hours)  Glucose, capillary      Status: Abnormal   Collection Time: 03/05/24 11:32 AM  Result Value Ref Range   Glucose-Capillary 132 (H) 70 - 99 mg/dL    Comment: Glucose reference range applies only to samples taken after fasting for at least 8 hours.  Glucose, capillary     Status: Abnormal   Collection Time: 03/05/24  3:16 PM  Result Value Ref Range   Glucose-Capillary 111 (H) 70 - 99 mg/dL    Comment: Glucose reference range applies only to samples taken after fasting for at least 8 hours.  Glucose, capillary     Status: Abnormal   Collection Time: 03/05/24  6:05 PM  Result Value Ref Range   Glucose-Capillary 124 (H) 70 - 99 mg/dL    Comment: Glucose reference range applies only to samples taken after fasting for at least 8 hours.  Glucose, capillary     Status: Abnormal   Collection Time: 03/05/24  8:06 PM  Result Value Ref Range   Glucose-Capillary 110 (H) 70 - 99 mg/dL    Comment: Glucose reference range applies only to samples taken after fasting for at least 8 hours.  Glucose, capillary     Status: Abnormal   Collection Time: 03/05/24 11:44 PM  Result Value Ref Range   Glucose-Capillary 161 (H) 70 - 99 mg/dL    Comment: Glucose reference range applies only to samples taken after fasting for at least 8 hours.  Glucose, capillary     Status: Abnormal   Collection Time: 03/06/24  3:44 AM  Result Value Ref Range   Glucose-Capillary 163 (H) 70 - 99 mg/dL    Comment: Glucose reference range applies only to samples taken after fasting for at least 8 hours.  CBC  Status: Abnormal   Collection Time: 03/06/24  5:27 AM  Result Value Ref Range   WBC 13.0 (H) 4.0 - 10.5 K/uL   RBC 2.68 (L) 4.22 - 5.81 MIL/uL   Hemoglobin 7.7 (L) 13.0 - 17.0 g/dL   HCT 74.0 (L) 60.9 - 47.9 %   MCV 96.6 80.0 - 100.0 fL   MCH 28.7 26.0 - 34.0 pg   MCHC 29.7 (L) 30.0 - 36.0 g/dL   RDW 81.8 (H) 88.4 - 84.4 %   Platelets 645 (H) 150 - 400 K/uL   nRBC 0.0 0.0 - 0.2 %    Comment: Performed at [facility], [address] [place], [name] [address]  Renal function panel     Status: Abnormal   Collection Time: 03/06/24  5:27 AM  Result Value Ref Range   Sodium 142 135 - 145 mmol/L   Potassium 4.2 3.5 - 5.1 mmol/L   Chloride 106 98 - 111 mmol/L   CO2 27 22 - 32 mmol/L   Glucose, Bld 153 (H) 70 - 99 mg/dL    Comment: Glucose reference range applies only to samples taken after fasting for at least 8 hours.   BUN 29 (H) 6 - 20 mg/dL   Creatinine, Ser 9.38 0.61 - 1.24 mg/dL   Calcium 8.9 8.9 - 89.6 mg/dL   Phosphorus 4.3 2.5 - 4.6 mg/dL   Albumin  2.7 (L) 3.5 - 5.0 g/dL   GFR, Estimated >39 >39 mL/min    Comment: (NOTE) Calculated using the CKD-EPI Creatinine Equation (2021)    Anion gap 9 5 - 15    Comment: Performed at [facility], [address], [place], [name] [address]  Glucose, capillary     Status: Abnormal   Collection Time: 03/06/24  7:24 AM  Result Value Ref Range   Glucose-Capillary 146 (H) 70 - 99 mg/dL    Comment: Glucose reference range applies only to samples taken after fasting for at least 8 hours.  Glucose, capillary     Status: Abnormal   Collection Time: 03/06/24 11:02 AM  Result Value Ref Range   Glucose-Capillary 143 (H) 70 - 99 mg/dL    Comment: Glucose reference range applies only to samples taken after fasting for at least 8 hours.  Glucose, capillary     Status: Abnormal   Collection Time: 03/06/24  7:41 PM  Result Value Ref Range   Glucose-Capillary 117 (H) 70 - 99 mg/dL    Comment: Glucose reference range applies only to samples taken after fasting for at least 8 hours.    DG CHEST PORT 1 VIEW Result Date: 03/06/2024 CLINICAL DATA:  [id number] Tracheostomy in place [facility]) [id number]. EXAM: PORTABLE CHEST 1 VIEW COMPARISON:  03/04/2024. FINDINGS: Low lung volume. Re-demonstration of left retrocardiac airspace opacity obscuring the left hemidiaphragm, descending thoracic aorta and blunting the left lateral costophrenic angle, suggesting combination of left lung atelectasis  and/or consolidation with pleural effusion. No significant interval change. Bilateral lung fields are otherwise clear. Right lateral costophrenic angle is clear. Stable moderately enlarged cardio-mediastinal silhouette. No acute osseous abnormalities. The soft tissues are within normal limits. Interval removal of endotracheal tube. There is a tracheostomy tube is with its tip at the level of clavicular heads, approximately 4.9 cm above the carina. No pneumothorax on either side. Redemonstration of right-sided PICC line with its tip overlying the cavoatrial junction region, unchanged. IMPRESSION: 1. Interval removal of endotracheal tube. Tracheostomy tube is in place, as described above. 2. Otherwise stable  exam. Electronically Signed   By: Ree Molt M.D.   On: 03/06/2024 08:52    ROS: unable to obtain  Blood pressure 101/81, pulse 96, temperature 99 F (37.2 C), temperature source Axillary, resp. rate 20, height 5' 8 (1.727 m), weight 97.8 kg, SpO2 93%.  PHYSICAL EXAM: CONSTITUTIONAL: well developed CARDIOVASCULAR: normal rate PULMONARY/CHEST WALL/Neck: tracheostomy in place, cuff up, no significant bleeding surrounding; on vent; suction passes easily HENT: Head : normocephalic Nose: nose without purulence   Studies Reviewed: Prior extensive ICU notes reviewed and most recent labs reviewed CT Neck 10/28/2023 reviewed and interpreted: innominate does not seem extremely high; appears to have relatively normal neck anatomy CXR 03/06/2024 independently reviewed: no pneumothorax, tracheostomy appears to be in place  Assessment/Plan: 48 y.o. with:   Chronic hypoxic respiratory failure 2.   S/p revision tracheostomy with Bjork flap 03/05/2024   No issues with tracheostomy currently, flange sutures and top stay suture in place; recommend padding inferior flange to avoid pressure injury.  Remainder of care per primary team Will see ~POD 7 to remove flange sutures Please notify ENT with  questions  Eldora KATHEE Blanch   03/07/2024, 7:20 AM

## 2024-03-07 NOTE — Progress Notes (Signed)
 eLink Physician-Brief Progress Note Patient Name: Garrett [name] DOB: [birth year] MRN: [id number]   Date of Service  03/07/2024  HPI/Events of Note  Pt with significant SAH, s/p tracheostomy.  It was endorsed to the nurse that the L pupil had been slightly smaller than the R (R pupil -3mm, L pupil - 2mm). On exam however, L pupil is now larger than the R (R pupil -3mm, L pupil - 4mm).  No other deficits reported. Still able to squeeze R hand.   eICU Interventions  Will check CT head.        Garrett [name] 03/07/2024, 9:08 PM

## 2024-03-07 NOTE — Progress Notes (Signed)
 Nutrition Follow-up  DOCUMENTATION CODES:  Not applicable  INTERVENTION:  Tube feeding via PEG:  Transition to Jevity 1.5 at 77ml/hr ( per day) 60ml ProSource TF20 BID Provides 2140 kcal, 124g protein, free water  per day MVI with minerals daily Discontinue banatrol  Repeat nutrition focused physical exam on follow up  NUTRITION DIAGNOSIS:  Inadequate oral intake related to inability to eat as evidenced by NPO status. - remains applicable  GOAL:  Patient will meet greater than or equal to 90% of their needs - goal met via TF  MONITOR:  Labs, Weight trends, TF tolerance, Skin  REASON FOR ASSESSMENT:  Consult Enteral/tube feeding initiation and management  ASSESSMENT:   Pt with PMH of HTN, daily mariajuana, and smoker admitted after being found down at home with pontine hemorrhagic stroke and trace SAH. UDS positive for opioids. Noted aspiration PNA on admission.  3/08 - admitted with [facility], intubated 3/14 - s/p cortrak placement (tip gastric) 3/22 - s/p tracheostomy 3/24 - s/p PEG 4/20 - Cuff changed to cuffless 5/10 - PEG dislodged, TF's stopped  5/11 - PEG replaced, TF's restarted at goal  6/10 - trach removed 7/07- desat, transferred back to 4N ICU, re-intubated  7/16 - s/p trach placement  Patient remains intubated on vent support via trach.  Unable to wean.   No significant changes in status. Patient continues to tolerate TF via PEG tube.   Per review of chart, pt now having loose stools. Noted 3 documented type 6/type 7 yesterday. RN today documented bowel incontinence but quantity not reported. Prior to this , pt had not had a documented BM x4 days. Will discontinue banatrol for now and transition to a fiber containing formula once current tube feeding bottle is complete in order to attempt to further regulate bowels.   Current weight: 97.8 kg + edema: non-pitting generalized, mild pitting BUE, BLE  Medications: colace BID, pepcid  BID, MVI,  miralax  BID Labs: reviewed  CBG's 117-163 x24 hours  Diet Order:   Diet Order             Diet NPO time specified  Diet effective midnight                   EDUCATION NEEDS:   No education needs have been identified at this time  Skin:  Skin Assessment: Reviewed RN Assessment Skin Integrity Issues:: Other (Comment) Stage II: Stage 2, perineum Other: Non pressure wound, left eye  Last BM:  7/18  Height:   Ht Readings from Last 1 Encounters:  03/05/24 5' 8 (1.727 m)    Weight:   Wt Readings from Last 1 Encounters:  03/07/24 97.8 kg    Ideal Body Weight:  70 kg  BMI:  Body mass index is 32.78 kg/m.  Estimated Nutritional Needs:   Kcal:  2000-2200  Protein:  125-140 grams  Fluid:  >2 L/day  Royce Maris, RDN, LDN Clinical Nutrition See AMiON for contact information.

## 2024-03-07 NOTE — Progress Notes (Signed)
 NAME:  Garrett [name], MRN:  [id number], DOB:  11-26-1975, LOS: 132 ADMISSION DATE:  10/27/2023, CONSULTATION DATE:  10/27/23 REFERRING MD:  Dr Madelyne, CHIEF COMPLAINT:  Found down   History of Present Illness:  48 y/o male with past medical history of hypertension who was found down for unknown downtime by his fiance when she came home from work. Reportedly, having agonal breathing.  He apparently had driven her to work this morning.  He has HTN but never checks his BP and does not follow with MD.  She says you can tell when his BP is really high; his eyes get blood shot and he is sweating.  No h/o seizures.  Besides almost daily [name] and cigarette smoking, she is not aware of any other drugs. Drug screen positive for opioids and he was given several doses of Narcan . Patient found to have:  1. 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 Basal cisterns and fourth ventricle are effaced without hydrocephalus at this time. 2. Trace additional subarachnoid hemorrhage along the left parietal convexity. BP in ED 262/156. His CXR revealed a RUL and perihilar infiltrates suggesting aspiration pneumonia. ED labs also revealed PH 7.169, LA 4.4, CPK 985. Patient was intubated in ED.  Pertinent  Medical History  Hypertension  Significant Hospital Events: Including procedures, antibiotic start and stop dates in addition to other pertinent events   3/8: Intubated in ED, pontine bleed/SAH. CT head 17:09 Acute large hemorrhage 4.1 cm in pons with intraventricular extension without hydrocephalus 3/9: CTA 03:14 AM No change in IPH in pons, similar biparietal [facility] 3/14: Now awake, appears locked in can follow commands with eyes. 3/16 Purposeful with left upper extremity  3/22 tracheostomy placed at bedside, bleeding issues overnight from trach site 3/24 PEG (Dr. Sebastian) 3/24 sputum  culture with MRSA 3/27 vomited. TF on hold. Abd film c/w ileus. Added reglan , got SSE. Did tolerate PSV all day  3/28 BMs x2 after SSE day prior. Added back TFs at 1/2 rated. Tolerated PS 3/29 Tolerated PS  3/31 Tolerating CPAP PS 15/5, TF on hold due to ileus. 4/1: con't to hold tube feeds today, restarting vancomycin  and sending tracheal aspirate as peaks are 37, plat 24, driving 19. Thick secretions. Fever overnight.  4/2: peaks improved. Ongoing hiccups. Trickle feeding. Neuro exam unchanged.  4/20: trach was changed to cuffless #8 4/28: not safe to swallow. Limited communication and he follows simple commands. On TF 5/1: on TC 28%, with large secretions. Working with speech and he is improving to initiate PMV trials under ST supervision   5/5: on TC 30%, 7 L/min. Trach #6 cuffless, changed 5/1 to facilitate PMV trials  5/12: on TC 21%, 6 L/min. Trach #6 cuffless, unable to produce sounds or speak on PMV. No significant resp secretions. On PEG TF 01/29/2024 tracheostomy was removed due to failure to well provide adequate airway 02/15/2024 pulmonary critical care consult. 7/7 patient obtunded and desaturating on NRB; transfer to icu for intubation; updated mother over phone who wants to reverse code status to full code; ent consulted 7/9 Remains intubated on no continuous sedation, right eye open but does not track movement  7/16 Revision ENT trach with [facility] Flap  Interim History / Subjective:   Failing weaning trials.  Remains on the ventilator.  Objective    Blood pressure 100/88, pulse 93, temperature 99 F (37.2 C), temperature source Axillary, resp. rate 20, height  5' 8 (1.727 m), weight 97.8 kg, SpO2 100%.    Vent Mode: PRVC FiO2 (%):  [40 %] 40 % Set Rate:  [20 bmp] 20 bmp Vt Set:  [540 mL] 540 mL PEEP:  [5 cmH20] 5 cmH20 Pressure Support:  [10 cmH20] 10 cmH20 Plateau Pressure:  [20 cmH20-27 cmH20] 20 cmH20   Intake/Output Summary (Last 24 hours) at 03/07/2024 0747 Last  data filed at 03/07/2024 0600 Gross per 24 hour  Intake 1265 ml  Output 730 ml  Net 535 ml   Filed Weights   03/04/24 0500 03/06/24 0500 03/07/24 0500  Weight: 97 kg 97 kg 97.8 kg    Examination: Gen:      No acute distress chronically ill-appearing HEENT:  EOMI, sclera anicteric, tracheostomy, left eye patch Neck:     No masses; no thyromegaly Lungs:    Clear to auscultation bilaterally; normal respiratory effort CV:         Regular rate and rhythm; no murmurs Abd:      + bowel sounds; soft, non-tender; no palpable masses, no distension Ext:    No edema; adequate peripheral perfusion Neuro: Awake, does not follow commands  No new labs or imaging  Resolved problem list  Post sedation hypotension Left eye conjunctivitis - resolved   Assessment and Plan   Acute hypoxic respiratory failure due to aspiration pneumonia w/ MRSA and Klebsiella PNA  -s/p tracheostomy placement 3/22 decannulated on 6/10 due to inability to pass suction catheter; reintubated on 7/7, re-do trach 7/16 - Completed 7 days of cefepime  and vancomycin  7/9 Plan P:  -Vent support - 8cc/kg  -F/u CXR  -F/u ABG PRN - VAP prevention protocol/ H2B - PAD protocol for sedation> prn fentanyl  w/ bowel regimen - intermittent CXR - wean FiO2 as able for SpO2 >92%  - aggressive pulmonary hygiene - prn duonebs - trach care  - ongoing vent weaning attempts   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 Coli UTI Discussed potential placement of suprapubic catheter with urology 7/9 recommendations made to establish discharge disposition (patient will likely need to remain inpatient upwards of 6 weeks post procedure ) and family agreeance prior to formal consult.  Called mother 7/9 mother does NOT wish to proceed at this time  - off abx 7/9 - Foley exchanged 7/12, next change due  Aug 12   Moderate protein calorie malnutrition Dysphagia plan - cont TF/ EN via PEG per RD - NPO/ aspiration precautions  - bowel regimen   HTN plan - remains hemodynamically stable - cont Norvasc  5mg  and Lopressor  50mg  BID  Elevated LFTs -improved 7/13 Plan - trend LFTs periodically   Hyperglycemia, resolved - remains controlled on BMET - prn CBG  Macrocytic anemia  Thrombocytosis  - No report of chronic alcohol  use. B12, folate and TSH normal Plan - no s/s bleeding, stable H/H, trend CBC - transfuse for Hgb < 7  Poor Dentition-Upper right teeth and one left lower tooth have chipped off. plan - Eventual dental evaluation  Best Practice (right click and Reselect all SmartList Selections daily)   Diet/type: tubefeeds and NPO w/ meds via tube DVT prophylaxis: LMWH GI prophylaxis: H2B Lines: N/A and yes and it is still needed RUE DL PICC 2/2> Foley:  Yes, and it is still needed Code Status:  full code Last date of multidisciplinary goals of care discussion  7/8  Pending update 7/18, no family at bedside.   The patient is critically ill with multiple organ system failure and requires high complexity decision making for assessment and support, frequent evaluation and titration of therapies, advanced monitoring, review of radiographic studies and interpretation of complex data.   Critical Care Time devoted to patient care services, exclusive of separately billable procedures, described in this note is 35 minutes.   Satin Boal MD [facility] See Amion for pager  If no response to pager , please call [phone] until 7pm After 7:00 pm call Elink  [phone] 03/07/2024, 7:54 AM

## 2024-03-08 ENCOUNTER — Inpatient Hospital Stay (HOSPITAL_COMMUNITY)

## 2024-03-08 ENCOUNTER — Other Ambulatory Visit (HOSPITAL_COMMUNITY)

## 2024-03-08 DIAGNOSIS — I613 Nontraumatic intracerebral hemorrhage in brain stem: Secondary | ICD-10-CM | POA: Diagnosis not present

## 2024-03-08 DIAGNOSIS — Z930 Tracheostomy status: Secondary | ICD-10-CM | POA: Diagnosis not present

## 2024-03-08 DIAGNOSIS — J9601 Acute respiratory failure with hypoxia: Secondary | ICD-10-CM | POA: Diagnosis not present

## 2024-03-08 DIAGNOSIS — R1310 Dysphagia, unspecified: Secondary | ICD-10-CM | POA: Diagnosis not present

## 2024-03-08 DIAGNOSIS — E440 Moderate protein-calorie malnutrition: Secondary | ICD-10-CM | POA: Diagnosis not present

## 2024-03-08 NOTE — Progress Notes (Signed)
 Placed on Trach collar, and pt is tolerating well at this time.

## 2024-03-08 NOTE — Progress Notes (Signed)
 NAME:  Garrett [name], MRN:  [id number], DOB:  March 01, 1976, LOS: 133 ADMISSION DATE:  10/27/2023, CONSULTATION DATE:  10/27/23 REFERRING MD:  Dr Madelyne, CHIEF COMPLAINT:  Found down   History of Present Illness:  48 y/o male with past medical history of hypertension who was found down for unknown downtime by his fiance when she came home from work. Reportedly, having agonal breathing.  He apparently had driven her to work this morning.  He has HTN but never checks his BP and does not follow with MD.  She says you can tell when his BP is really high; his eyes get blood shot and he is sweating.  No h/o seizures.  Besides almost daily [name] and cigarette smoking, she is not aware of any other drugs. Drug screen positive for opioids and he was given several doses of Narcan . Patient found to have:   1. 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 Basal cisterns and fourth ventricle are effaced without hydrocephalus at this time. 2. Trace additional subarachnoid hemorrhage along the left parietal convexity. BP in ED 262/156. His CXR revealed a RUL and perihilar infiltrates suggesting aspiration pneumonia. ED labs also revealed PH 7.169, LA 4.4, CPK 985. Patient was intubated in ED.  Pertinent  Medical History  Hypertension  Significant Hospital Events: Including procedures, antibiotic start and stop dates in addition to other pertinent events   3/8: Intubated in ED, pontine bleed/SAH. CT head 17:09 Acute large hemorrhage 4.1 cm in pons with intraventricular extension without hydrocephalus 3/9: CTA 03:14 AM No change in IPH in pons, similar biparietal [facility] 3/14: Now awake, appears locked in can follow commands with eyes. 3/16 Purposeful with left upper extremity  3/22 tracheostomy placed at bedside, bleeding issues overnight from trach site 3/24 PEG (Dr. Sebastian) 3/24 sputum  culture with MRSA 3/27 vomited. TF on hold. Abd film c/w ileus. Added reglan , got SSE. Did tolerate PSV all day  3/28 BMs x2 after SSE day prior. Added back TFs at 1/2 rated. Tolerated PS 3/29 Tolerated PS  3/31 Tolerating CPAP PS 15/5, TF on hold due to ileus. 4/1: con't to hold tube feeds today, restarting vancomycin  and sending tracheal aspirate as peaks are 37, plat 24, driving 19. Thick secretions. Fever overnight.  4/2: peaks improved. Ongoing hiccups. Trickle feeding. Neuro exam unchanged.  4/20: trach was changed to cuffless #8 4/28: not safe to swallow. Limited communication and he follows simple commands. On TF 5/1: on TC 28%, with large secretions. Working with speech and he is improving to initiate PMV trials under ST supervision   5/5: on TC 30%, 7 L/min. Trach #6 cuffless, changed 5/1 to facilitate PMV trials  5/12: on TC 21%, 6 L/min. Trach #6 cuffless, unable to produce sounds or speak on PMV. No significant resp secretions. On PEG TF 01/29/2024 tracheostomy was removed due to failure to well provide adequate airway 02/15/2024 pulmonary critical care consult. 7/7 patient obtunded and desaturating on NRB; transfer to icu for intubation; updated mother over phone who wants to reverse code status to full code; ent consulted 7/9 Remains intubated on no continuous sedation, right eye open but does not track movement  7/16 Revision ENT trach with [facility] Flap 7/18 weaned on vent 5 hours  7/19 No issues overnight, remains on vent   Interim History / Subjective:  Sleepy on vent this am, opens eye to verbal stimuli   Objective  Blood pressure (!) 116/90, pulse 94, temperature 98.9 F (37.2 C), temperature source Axillary, resp. rate 20, height 5' 8 (1.727 m), weight 97.8 kg, SpO2 98%.    Vent Mode: PRVC FiO2 (%):  [40 %] 40 % Set Rate:  [20 bmp] 20 bmp Vt Set:  [540 mL] 540 mL PEEP:  [5 cmH20] 5 cmH20 Pressure Support:  [8 cmH20] 8 cmH20 Plateau Pressure:  [19 cmH20-21 cmH20]  21 cmH20   Intake/Output Summary (Last 24 hours) at 03/08/2024 9277 Last data filed at 03/08/2024 0600 Gross per 24 hour  Intake 1710 ml  Output 690 ml  Net 1020 ml   Filed Weights   03/04/24 0500 03/06/24 0500 03/07/24 0500  Weight: 97 kg 97 kg 97.8 kg    Examination: General: Acute on chronically ill appearing middle aged male lying in bedon mechanical ventilation via trach, in NAD HEENT: 6 cuffed Trach midline, MM pink/moist, PERRL,  Neuro: Opens eyes to verbal stimuli  CV: s1s2 regular rate and rhythm, no murmur, rubs, or gallops,  PULM:  Cleat to auscultation, no increased work of breathing, no added breath sounds  GI: soft, bowel sounds active in all 4 quadrants, non-tender, non-distended, tolerating TF Extremities: warm/dry, no edema  Skin: no rashes or lesions  Resolved problem list  Post sedation hypotension Left eye conjunctivitis - resolved   Assessment and Plan   Acute hypoxic respiratory failure due to aspiration pneumonia w/ MRSA and Klebsiella PNA  -s/p tracheostomy placement 3/22 decannulated on 6/10 due to inability to pass suction catheter; reintubated on 7/7, re-do trach 7/16 - Completed 7 days of cefepime  and vancomycin  7/9 P: Routine trach care, hopeful to trial ATC soon  Continue ventilator support with lung protective strategies  Wean PEEP and FiO2 for sats greater than 90%. Head of bed elevated 30 degrees. Plateau pressures less than 30 cm H20.  Follow intermittent chest x-ray and ABG.   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 Coli UTI -Discussed potential placement of suprapubic catheter with urology 7/9  recommendations made to establish discharge disposition (patient will likely need to remain inpatient upwards of 6 weeks post procedure ) and family agreeance prior to formal consult.  Called mother 7/9 mother does NOT wish to proceed at this time  - off abx 7/9 P: Continue foley with next exchanged planned 04/01/24  Moderate protein calorie malnutrition Dysphagia P: Continue TF per PEG  NPO/aspiration precautions  Bowel regiment   HTN P: Continuous telemetry  Continue Norvasc  and Lopressor    Elevated LFTs -improved 7/13 P: Intermittently tredn LFTs Avoid hepatotoxins   Hyperglycemia, resolved P: Trend CBG on Bmet   Macrocytic anemia  Thrombocytosis  - No report of chronic alcohol  use. B12, folate and TSH normal P: Trend CBC Transfuse per protocol  Hgb goal >7  Poor Dentition -Upper right teeth and one left lower tooth have chipped off. P: Eventual dental evaluation   Best Practice (right click and Reselect all SmartList Selections daily)   Diet/type: tubefeeds and NPO w/ meds via tube DVT prophylaxis: LMWH GI prophylaxis: H2B Lines: N/A and yes and it is still needed RUE DL PICC 2/2> Foley:  Yes, and it is still needed Code Status:  full code Last date of multidisciplinary goals of care discussion 7/8  Pending update 7/18, no family at bedside.   CRITICAL CARE Performed by: Kyira Volkert D. Harris   Total critical care time: 38 minutes  Critical care time was exclusive of separately billable procedures and treating other patients.  Critical care was necessary to treat or prevent imminent or life-threatening deterioration.  Critical care was time spent personally by me on the following activities: development of treatment plan with patient and/or surrogate as well as nursing, discussions with consultants, evaluation of patient's response to treatment, examination of patient, obtaining history from patient or surrogate, ordering and performing treatments and  interventions, ordering and review of laboratory studies, ordering and review of radiographic studies, pulse oximetry and re-evaluation of patient's condition.  Norval Slaven D. Harris, NP-C [facility] Personal contact information can be found on Amion  If no contact or response made please call [phone] 03/08/2024, 7:28 AM

## 2024-03-08 NOTE — Progress Notes (Signed)
 Patient taken to and from CT on vent without complications. Spare trach, obturator and ambu bag at bedside during transport. Pt returned to room with vent plugged back to red outlet and wall O2 & air.

## 2024-03-08 NOTE — Plan of Care (Signed)
  Problem: Nutrition: Goal: Risk of aspiration will decrease Outcome: Progressing Goal: Dietary intake will improve Outcome: Progressing   

## 2024-03-09 DIAGNOSIS — J9601 Acute respiratory failure with hypoxia: Secondary | ICD-10-CM | POA: Diagnosis not present

## 2024-03-09 DIAGNOSIS — Z930 Tracheostomy status: Secondary | ICD-10-CM | POA: Diagnosis not present

## 2024-03-09 DIAGNOSIS — E440 Moderate protein-calorie malnutrition: Secondary | ICD-10-CM | POA: Diagnosis not present

## 2024-03-09 DIAGNOSIS — I613 Nontraumatic intracerebral hemorrhage in brain stem: Secondary | ICD-10-CM | POA: Diagnosis not present

## 2024-03-09 DIAGNOSIS — R1310 Dysphagia, unspecified: Secondary | ICD-10-CM | POA: Diagnosis not present

## 2024-03-09 LAB — GLUCOSE, CAPILLARY: Glucose-Capillary: 136 mg/dL — ABNORMAL HIGH (ref 70–99)

## 2024-03-09 MED ORDER — SODIUM CHLORIDE 3 % IN NEBU
4.0000 mL | INHALATION_SOLUTION | Freq: Two times a day (BID) | RESPIRATORY_TRACT | Status: AC
  Administered 2024-03-09 – 2024-03-12 (×6): 4 mL via RESPIRATORY_TRACT
  Filled 2024-03-09 (×6): qty 15

## 2024-03-09 NOTE — Progress Notes (Signed)
 NAME:  Garrett [name], MRN:  [id number], DOB:  [birth year], LOS: 134 ADMISSION DATE:  10/27/2023, CONSULTATION DATE:  10/27/23 REFERRING MD:  Dr Madelyne, CHIEF COMPLAINT:  Found down   History of Present Illness:  48 y/o male with past medical history of hypertension who was found down for unknown downtime by his fiance when she came home from work. Reportedly, having agonal breathing.  He apparently had driven her to work this morning.  He has HTN but never checks his BP and does not follow with MD.  She says you can tell when his BP is really high; his eyes get blood shot and he is sweating.  No h/o seizures.  Besides almost daily [name] and cigarette smoking, she is not aware of any other drugs. Drug screen positive for opioids and he was given several doses of Narcan . Patient found to have:   1. 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 Basal cisterns and fourth ventricle are effaced without hydrocephalus at this time. 2. Trace additional subarachnoid hemorrhage along the left parietal convexity. BP in ED 262/156. His CXR revealed a RUL and perihilar infiltrates suggesting aspiration pneumonia. ED labs also revealed PH 7.169, LA 4.4, CPK 985. Patient was intubated in ED.  Pertinent  Medical History  Hypertension  Significant Hospital Events: Including procedures, antibiotic start and stop dates in addition to other pertinent events   3/8: Intubated in ED, pontine bleed/SAH. CT head 17:09 Acute large hemorrhage 4.1 cm in pons with intraventricular extension without hydrocephalus 3/9: CTA 03:14 AM No change in IPH in pons, similar biparietal [facility] 3/14: Now awake, appears locked in can follow commands with eyes. 3/16 Purposeful with left upper extremity  3/22 tracheostomy placed at bedside, bleeding issues overnight from trach site 3/24 PEG (Dr. Sebastian) 3/24 sputum  culture with MRSA 3/27 vomited. TF on hold. Abd film c/w ileus. Added reglan , got SSE. Did tolerate PSV all day  3/28 BMs x2 after SSE day prior. Added back TFs at 1/2 rated. Tolerated PS 3/29 Tolerated PS  3/31 Tolerating CPAP PS 15/5, TF on hold due to ileus. 4/1: con't to hold tube feeds today, restarting vancomycin  and sending tracheal aspirate as peaks are 37, plat 24, driving 19. Thick secretions. Fever overnight.  4/2: peaks improved. Ongoing hiccups. Trickle feeding. Neuro exam unchanged.  4/20: trach was changed to cuffless #8 4/28: not safe to swallow. Limited communication and he follows simple commands. On TF 5/1: on TC 28%, with large secretions. Working with speech and he is improving to initiate PMV trials under ST supervision   5/5: on TC 30%, 7 L/min. Trach #6 cuffless, changed 5/1 to facilitate PMV trials  5/12: on TC 21%, 6 L/min. Trach #6 cuffless, unable to produce sounds or speak on PMV. No significant resp secretions. On PEG TF 01/29/2024 tracheostomy was removed due to failure to well provide adequate airway 02/15/2024 pulmonary critical care consult. 7/7 patient obtunded and desaturating on NRB; transfer to icu for intubation; updated mother over phone who wants to reverse code status to full code; ent consulted 7/9 Remains intubated on no continuous sedation, right eye open but does not track movement  7/16 Revision ENT trach with [facility] Flap 7/18 weaned on vent 5 hours  7/19 tolerated trach collar for the majority of the day but had some increased work of breath resulting in being placed back on vent support  7/20 No issues  overnight, back on ATC this am  Interim History / Subjective:  Alert but not following commands this am   Objective    Blood pressure (!) 111/92, pulse 91, temperature 98.7 F (37.1 C), temperature source Axillary, resp. rate (!) 26, height 5' 8 (1.727 m), weight 97.8 kg, SpO2 96%.    Vent Mode: PRVC FiO2 (%):  [28 %-40 %] 40 % Set Rate:   [20 bmp] 20 bmp Vt Set:  [540 mL] 540 mL PEEP:  [5 cmH20] 5 cmH20 Plateau Pressure:  [20 cmH20-25 cmH20] 25 cmH20   Intake/Output Summary (Last 24 hours) at 03/09/2024 9093 Last data filed at 03/09/2024 0900 Gross per 24 hour  Intake 1520 ml  Output 680 ml  Net 840 ml   Filed Weights   03/04/24 0500 03/06/24 0500 03/07/24 0500  Weight: 97 kg 97 kg 97.8 kg    Examination: General: Acute on chronically ill appearing adult male lying in bed on mechanical ventilation, in NAD HEENT: 6 cuffed trach midline, MM pink/moist, PERRL,  Neuro: Eyes spontaneously open, no tracking movement  CV: s1s2 regular rate and rhythm, no murmur, rubs, or gallops,  PULM:  Clear to auscultation, slight increased work of breathing after getting bed bath, on ATC GI: soft, bowel sounds active in all 4 quadrants, non-tender, non-distended, tolerating TF Extremities: warm/dry, no edema  Skin: no rashes or lesions  Resolved problem list  Post sedation hypotension Left eye conjunctivitis - resolved   Assessment and Plan   Acute hypoxic respiratory failure due to aspiration pneumonia w/ MRSA and Klebsiella PNA  -s/p tracheostomy placement 3/22 decannulated on 6/10 due to inability to pass suction catheter; reintubated on 7/7, re-do trach 7/16 - Completed 7 days of cefepime  and vancomycin  7/9 P: Routine trach care  Goal to maintain ATC  Continue ventilator support with lung protective strategies  Wean PEEP and FiO2 for sats greater than 90%. Head of bed elevated 30 degrees. Plateau pressures less than 30 cm H20.  Follow intermittent chest x-ray and ABG.   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 Coli UTI -Discussed potential placement of suprapubic catheter with urology 7/9 recommendations made to establish discharge disposition (patient will likely need to remain inpatient upwards of 6 weeks post procedure ) and family agreeance prior to formal consult.  Called mother 7/9 mother does NOT wish to proceed at this time  - off abx 7/9 P: Continue Foley with next exchange planned 04/01/24  Moderate protein calorie malnutrition Dysphagia P: Continue TF per PEG  NPO/aspiration precautions  Bowel regiment   HTN P: Continuous telemetry  Continue Norvasc  and Lopressor    Elevated LFTs -improved 7/13 P: Intermittently trend LFTs  Avoid hepatotoxins   Hyperglycemia, resolved P: Trend CBC and Bmet   Macrocytic anemia  Thrombocytosis  - No report of chronic alcohol  use. B12, folate and TSH normal P: Trend CBC  Transfuse per protocol  Hgb goal >7  Poor Dentition -Upper right teeth and one left lower tooth have chipped off. P: Outpatient eval  Best Practice (right click and Reselect all SmartList Selections daily)   Diet/type: tubefeeds and NPO w/ meds via tube DVT prophylaxis: LMWH GI prophylaxis: H2B Lines: N/A and  yes and it is still needed RUE DL PICC 2/2> Foley:  Yes, and it is still needed Code Status:  full code Last date of multidisciplinary goals of care discussion 7/8  Pending update 7/18, no family at bedside.   CRITICAL CARE Performed by: Lacheryl Niesen D. Harris   Total critical care time: 39 minutes  Critical care time was exclusive of separately billable procedures and treating other patients.  Critical care was necessary to treat or prevent imminent or life-threatening deterioration.  Critical care was time spent personally by me on the following activities: development of treatment plan with patient and/or surrogate as well as nursing, discussions with consultants, evaluation of patient's response to treatment, examination of  patient, obtaining history from patient or surrogate, ordering and performing treatments and interventions, ordering and review of laboratory studies, ordering and review of radiographic studies, pulse oximetry and re-evaluation of patient's condition.  Saanya Zieske D. Harris, NP-C [facility] Personal contact information can be found on Amion  If no contact or response made please call [phone] 03/09/2024, 9:06 AM

## 2024-03-09 NOTE — Plan of Care (Signed)
  Problem: Nutrition: Goal: Dietary intake will improve Outcome: Progressing   Problem: Fluid Volume: Goal: Ability to maintain a balanced intake and output will improve Outcome: Progressing

## 2024-03-09 NOTE — Plan of Care (Signed)
  Problem: Intracerebral Hemorrhage Tissue Perfusion: Goal: Complications of Intracerebral Hemorrhage will be minimized Outcome: Progressing   Problem: Nutrition: Goal: Dietary intake will improve 03/09/2024 1731 by Nowell Sid LABOR, RN Outcome: Progressing 03/09/2024 1552 by Nowell Sid A, RN Outcome: Progressing   Problem: Fluid Volume: Goal: Ability to maintain a balanced intake and output will improve Outcome: Progressing

## 2024-03-10 ENCOUNTER — Inpatient Hospital Stay (HOSPITAL_COMMUNITY)

## 2024-03-10 DIAGNOSIS — A419 Sepsis, unspecified organism: Secondary | ICD-10-CM | POA: Diagnosis not present

## 2024-03-10 DIAGNOSIS — I613 Nontraumatic intracerebral hemorrhage in brain stem: Secondary | ICD-10-CM | POA: Diagnosis not present

## 2024-03-10 DIAGNOSIS — I10 Essential (primary) hypertension: Secondary | ICD-10-CM | POA: Diagnosis not present

## 2024-03-10 DIAGNOSIS — Z930 Tracheostomy status: Secondary | ICD-10-CM | POA: Diagnosis not present

## 2024-03-10 DIAGNOSIS — J690 Pneumonitis due to inhalation of food and vomit: Secondary | ICD-10-CM | POA: Diagnosis not present

## 2024-03-10 DIAGNOSIS — J9601 Acute respiratory failure with hypoxia: Secondary | ICD-10-CM | POA: Diagnosis not present

## 2024-03-10 DIAGNOSIS — I619 Nontraumatic intracerebral hemorrhage, unspecified: Secondary | ICD-10-CM | POA: Diagnosis not present

## 2024-03-10 DIAGNOSIS — R1310 Dysphagia, unspecified: Secondary | ICD-10-CM | POA: Diagnosis not present

## 2024-03-10 DIAGNOSIS — E440 Moderate protein-calorie malnutrition: Secondary | ICD-10-CM | POA: Diagnosis not present

## 2024-03-10 LAB — BASIC METABOLIC PANEL WITH GFR
Anion gap: 8 (ref 5–15)
Anion gap: 9 (ref 5–15)
BUN: 21 mg/dL — ABNORMAL HIGH (ref 6–20)
BUN: 19 mg/dL (ref 6–20)
CO2: 28 mmol/L (ref 22–32)
CO2: 28 mmol/L (ref 22–32)
Calcium: 8.8 mg/dL — ABNORMAL LOW (ref 8.9–10.3)
Calcium: 8.9 mg/dL (ref 8.9–10.3)
Chloride: 105 mmol/L (ref 98–111)
Chloride: 107 mmol/L (ref 98–111)
Creatinine, Ser: 0.47 mg/dL — ABNORMAL LOW (ref 0.61–1.24)
Creatinine, Ser: 0.44 mg/dL — ABNORMAL LOW (ref 0.61–1.24)
GFR, Estimated: >60 mL/min (ref >60)
GFR, Estimated: >60 mL/min (ref >60)
Glucose, Bld: 125 mg/dL — ABNORMAL HIGH (ref 70–99)
Glucose, Bld: 122 mg/dL — ABNORMAL HIGH (ref 70–99)
Potassium: 3.8 mmol/L (ref 3.5–5.1)
Potassium: 3.8 mmol/L (ref 3.5–5.1)
Sodium: 141 mmol/L (ref 135–145)
Sodium: 144 mmol/L (ref 135–145)

## 2024-03-10 LAB — CBC
HCT: 25.3 % — ABNORMAL LOW (ref 39.0–52.0)
Hemoglobin: 7.4 g/dL — ABNORMAL LOW (ref 13.0–17.0)
MCH: 28.6 pg (ref 26.0–34.0)
MCHC: 29.2 g/dL — ABNORMAL LOW (ref 30.0–36.0)
MCV: 97.7 fL (ref 80.0–100.0)
Platelets: 477 K/uL — ABNORMAL HIGH (ref 150–400)
RBC: 2.59 MIL/uL — ABNORMAL LOW (ref 4.22–5.81)
RDW: 17.5 % — ABNORMAL HIGH (ref 11.5–15.5)
WBC: 11 K/uL — ABNORMAL HIGH (ref 4.0–10.5)
nRBC: 0.2 % (ref 0.0–0.2)

## 2024-03-10 LAB — MAGNESIUM: Magnesium: 2.1 mg/dL (ref 1.7–2.4)

## 2024-03-10 LAB — PHOSPHORUS: Phosphorus: 2 mg/dL — ABNORMAL LOW (ref 2.5–4.6)

## 2024-03-10 NOTE — Progress Notes (Signed)
 eLink Physician-Brief Progress Note Patient Name: Nameer Summer DOB: 17-May-1976 MRN: [id number]   Date of Service  03/10/2024  HPI/Events of Note  Received request for AM labs Reviewed previous labs and abnormalities noted Reasonable to repeat AM CBC and electrolytes  eICU Interventions  CBC BMP ordered Already has Mg and Phos order in place     Intervention Category Intermediate Interventions: Other:;Diagnostic test evaluation  Damien ONEIDA Grout 03/10/2024, 9:21 PM

## 2024-03-10 NOTE — TOC Progression Note (Signed)
 Transition of Care [facility]) - Progression Note    Patient Details  Name: Garrett [name] MRN: [id number] Date of Birth: 1975/12/23  Transition of Care [facility]) CM/SW Contact  Robynn Eileen Hoose, RN Phone Number: 03/10/2024, 4:06 PM  Clinical Narrative:     Progression rounds update; Trach on vent, receiving tube feedings, has PICC line and foley.  Expected Discharge Plan: Long Term Nursing Home Barriers to Discharge: Continued Medical Work up, SNF Pending Medicaid, SNF Pending bed offer, SNF Pending payor source - LOG, Inadequate or no insurance (New trach)  Expected Discharge Plan and Services In-house Referral: Clinical Social Work, Hospice / Palliative Care   Post Acute Care Choice: Skilled Nursing Facility Living arrangements for the past 2 months: Single Family Home                                       Social Determinants of Health (SDOH) Interventions SDOH Screenings   Food Insecurity: Patient Unable To Answer (10/29/2023)  Social Connections: Unknown (06/23/2023)   Received from [facility]  Tobacco Use: High Risk (10/31/2023)    Readmission Risk Interventions     No data to display

## 2024-03-10 NOTE — Progress Notes (Signed)
 NAME:  Garrett [name], MRN:  [id number], DOB:  1975-11-21, LOS: 135 ADMISSION DATE:  10/27/2023, CONSULTATION DATE:  10/27/2023 REFERRING MD: Madelyne - TRH, CHIEF COMPLAINT:  Found down   History of Present Illness:  48 y/o man who was found down at home. PMHx significant for HTN.  Patient was down for an unknown amount of time, found by his fiance when she came home from work, reportedly with agonal breathing. Patient had driven her to work the morning of admission.  He has HTN but never checks his BP and does not follow with MD. Patient's fiance states that she can tell when his BP is really high; his eyes get blood shot and he is sweating. No h/o seizures. Besides almost daily marijuana and cigarette smoking, she is not aware of any other drug use. Drug screen positive for opioids and he was given several doses of Narcan . 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 BP in ED 262/156. CXR revealed RUL and perihilar infiltrates suggesting aspiration pneumonia. ED labs also revealed PH 7.169, LA 4.4, CPK 985. Patient was intubated in ED.  PCCM consulted.  Pertinent Medical History:  Hypertension  Significant Hospital Events: Including procedures, antibiotic start and stop dates in addition to other pertinent events   3/08 - Intubated in ED, pontine bleed/SAH. CT head 17:09 Acute large hemorrhage 4.1 cm in pons with intraventricular extension without hydrocephalus 3/09 - CTA 03:14 AM No change in IPH in pons, similar biparietal [facility] 3/14 - Now awake, appears locked in can follow commands with eyes. 3/16 - Purposeful with left upper extremity  3/22 - Tracheostomy placed at bedside, bleeding issues overnight from trach site 3/24 - PEG (Dr. Sebastian) 3/24 - Sputum culture with MRSA 3/27  - Vomited. TF on hold. Abd film c/w ileus. Added reglan , got SSE. Did tolerate PSV all day  3/28 - BMs x2 after SSE day prior. Added back TFs at 1/2 rated. Tolerated PS 3/29 - Tolerated PS  3/31 - Tolerating CPAP PS 15/5, TF on hold due to ileus. 4/01 - Con't to hold tube feeds today, restarting vancomycin  and sending tracheal aspirate as peaks are 37, plat 24, driving 19. Thick secretions. Fever overnight.  4/02 - Peaks improved. Ongoing hiccups. Trickle feeding. Neuro exam unchanged.  4/20 - Trach was changed to cuffless #8 4/28 - Not safe to swallow. Limited communication and he follows simple commands. On TF 5/01 - On TC 28%, with large secretions. Working with speech and he is improving to initiate PMV trials under ST supervision   5/05 - On TC 30%, 7 L/min. Trach #6 cuffless, changed 5/1 to facilitate PMV trials  5/12 - On TC 21%, 6 L/min. Trach #6 cuffless, unable to produce sounds or speak on PMV. No significant resp secretions. On PEG TF 6/10 - Tracheostomy was removed due to failure to well provide adequate airway 02/15/2024 pulmonary critical care consult. 7/07 Patient obtunded and desaturating on NRB; transfer to icu for intubation; updated mother over phone who wants to reverse code status to full code; ent consulted 7/09 Remains intubated on no continuous sedation, right eye open but does not track movement  7/16 Revision ENT trach with [facility] Flap 7/18 Weaned on vent 5 hours  7/19 Tolerated trach collar for the majority of the day but had some increased work of breath  resulting in being placed back on vent support  7/20 No issues overnight, back on ATC this AM  Interim History / Subjective:  No significant events overnight Tolerated ATC majority of the day 7/20 This AM, became tachypneic/did not tolerate ATC - likely 2/2 weakness Weakly following commands this AM (squeeze hand, thumbs up, attempted to stick out tongue)  Objective:   Blood pressure 120/89, pulse 91,  temperature 98.4 F (36.9 C), temperature source Axillary, resp. rate 20, height 5' 8 (1.727 m), weight 96 kg, SpO2 97%.    Vent Mode: PRVC FiO2 (%):  [40 %] 40 % Set Rate:  [20 bmp] 20 bmp Vt Set:  [540 mL] 540 mL PEEP:  [5 cmH20] 5 cmH20 Plateau Pressure:  [20 cmH20-31 cmH20] 24 cmH20   Intake/Output Summary (Last 24 hours) at 03/10/2024 0749 Last data filed at 03/09/2024 1843 Gross per 24 hour  Intake 680 ml  Output 425 ml  Net 255 ml   Filed Weights   03/06/24 0500 03/07/24 0500 03/10/24 0500  Weight: 97 kg 97.8 kg 96 kg   Physical Examination: General: Acutely ill-appearing middle-aged man in NAD. HEENT: [place]/AT, anicteric sclera, PERRL, moist mucous membranes. Neck: Tracheostomy in place, mild clear-white secretions. No bleeding/erythema. Neuro: Awake, alert. Unable to assess orientation. Briefly tracks examiner. +Roving nystagmus. Opens eyes to verbal stimuli. Following commands intermittently. Profound generalized weakness. +Cough and +Gag  CV: RRR, no m/g/r. PULM: Breathing even and unlabored on vent (PEEP 5, FiO2 40% via trach). Lung fields with faint expiratory wheeze R > L. GI: Soft, nontender, nondistended. Normoactive bowel sounds. Extremities: Mild generalized symmetric bilateral UE/LE edema noted. Skin: Warm/dry, no rashes.  Resolved problem list  Post sedation hypotension Left eye conjunctivitis - resolved  Hyperglycemia, resolved  Assessment and Plan   Acute hypoxic respiratory failure due to aspiration pneumonia w/ MRSA and Klebsiella PNA  S/p tracheostomy placement 3/22, decannulated on 6/10 due to inability to pass suction catheter; reintubated on 7/7, re-do trach 7/16 by ENT with Bjork flap (Dr. Tobie). Completed 7 days of cefepime  and vancomycin  7/9. 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  - Neuroprotective measures: HOB > 30 degrees, normoglycemia, normothermia, electrolytes WNL - Minimize sedating medications as able - Nutrition/bowel regimen - PT/OT/SLP as able  Complicated E. Coli UTI Discussed potential placement of suprapubic catheter with urology 7/9 recommendations made to establish discharge disposition (patient will likely need to remain inpatient upwards of 6 weeks post procedure ) and family agreeance prior to formal consult.  Called mother 7/9 mother does NOT wish to proceed at this time. - Maintain Foley; next exchange planned for 8/12 - Off of antibiotics since 7/9 - Reconsult Urology as needed  Moderate protein calorie malnutrition Dysphagia - TF per PEG tube - NPO/aspiration precautions - Bowel regimen - Appreciate Nutrition/RD assistance  HTN - Cardiac monitoring - Continue Norvasc , Lopressor   Elevated LFTs, improved 7/13 - Trend intermittently - Near normalized 7/15  Macrocytic anemia  Thrombocytosis  No report of chronic alcohol  use. B12, folate and TSH normal - Trend H&H - Monitor for signs of active bleeding - Transfuse for Hgb <  7.0 or hemodynamically significant bleeding  Poor Dentition Upper right teeth and one left lower tooth have chipped off. - F/u as outpatient  Best Practice (right click and Reselect all SmartList Selections daily)   Diet/type: tubefeeds and NPO w/ meds via tube DVT prophylaxis: LMWH GI prophylaxis: H2B Lines: N/A and yes and it is still needed RUE DL PICC 2/2> Foley:  Yes, and it is still needed Code Status:  full code Last date of multidisciplinary goals of care discussion 7/8  Critical care time:   The patient is critically ill with multiple organ system failure and requires high complexity decision making for  assessment and support, frequent evaluation and titration of therapies, advanced monitoring, review of radiographic studies and interpretation of complex data.   Critical Care Time devoted to patient care services, exclusive of separately billable procedures, described in this note is 34 minutes.  Corean CHRISTELLA Tarryn Bogdan, PA-C [place] 03/10/24 7:50 AM  Please see [email or web address] for pager details.  From 7A-7P if no response, please call [phone] After hours, please call ELink [phone]

## 2024-03-11 DIAGNOSIS — R1310 Dysphagia, unspecified: Secondary | ICD-10-CM | POA: Diagnosis not present

## 2024-03-11 DIAGNOSIS — D509 Iron deficiency anemia, unspecified: Secondary | ICD-10-CM | POA: Diagnosis not present

## 2024-03-11 DIAGNOSIS — J9601 Acute respiratory failure with hypoxia: Secondary | ICD-10-CM | POA: Diagnosis not present

## 2024-03-11 DIAGNOSIS — J690 Pneumonitis due to inhalation of food and vomit: Secondary | ICD-10-CM | POA: Diagnosis not present

## 2024-03-11 DIAGNOSIS — B9620 Unspecified Escherichia coli [E. coli] as the cause of diseases classified elsewhere: Secondary | ICD-10-CM | POA: Diagnosis not present

## 2024-03-11 DIAGNOSIS — N390 Urinary tract infection, site not specified: Secondary | ICD-10-CM | POA: Diagnosis not present

## 2024-03-11 DIAGNOSIS — E440 Moderate protein-calorie malnutrition: Secondary | ICD-10-CM | POA: Diagnosis not present

## 2024-03-11 DIAGNOSIS — I619 Nontraumatic intracerebral hemorrhage, unspecified: Secondary | ICD-10-CM | POA: Diagnosis not present

## 2024-03-11 DIAGNOSIS — I10 Essential (primary) hypertension: Secondary | ICD-10-CM | POA: Diagnosis not present

## 2024-03-11 LAB — CBC
HCT: 26.2 % — ABNORMAL LOW (ref 39.0–52.0)
Hemoglobin: 7.6 g/dL — ABNORMAL LOW (ref 13.0–17.0)
MCH: 28 pg (ref 26.0–34.0)
MCHC: 29 g/dL — ABNORMAL LOW (ref 30.0–36.0)
MCV: 96.7 fL (ref 80.0–100.0)
Platelets: 464 K/uL — ABNORMAL HIGH (ref 150–400)
RBC: 2.71 MIL/uL — ABNORMAL LOW (ref 4.22–5.81)
RDW: 17.3 % — ABNORMAL HIGH (ref 11.5–15.5)
WBC: 8.2 K/uL (ref 4.0–10.5)
nRBC: 0.2 % (ref 0.0–0.2)

## 2024-03-11 LAB — BASIC METABOLIC PANEL WITH GFR
Anion gap: 8 (ref 5–15)
BUN: 21 mg/dL — ABNORMAL HIGH (ref 6–20)
CO2: 27 mmol/L (ref 22–32)
Calcium: 8.9 mg/dL (ref 8.9–10.3)
Chloride: 106 mmol/L (ref 98–111)
Creatinine, Ser: 0.45 mg/dL — ABNORMAL LOW (ref 0.61–1.24)
GFR, Estimated: >60 mL/min (ref >60)
Glucose, Bld: 130 mg/dL — ABNORMAL HIGH (ref 70–99)
Potassium: 3.5 mmol/L (ref 3.5–5.1)
Sodium: 141 mmol/L (ref 135–145)

## 2024-03-11 MED ORDER — POTASSIUM CHLORIDE 20 MEQ PO PACK
40.0000 meq | PACK | Freq: Once | ORAL | Status: AC
  Administered 2024-03-11: 40 meq
  Filled 2024-03-11: qty 2

## 2024-03-11 NOTE — Progress Notes (Signed)
 [facility] ADULT ICU REPLACEMENT PROTOCOL   The patient does apply for the [facility] Adult ICU Electrolyte Replacment Protocol based on the criteria listed below:   1.Exclusion criteria: TCTS, ECMO, Dialysis, and Myasthenia Gravis patients 2. Is GFR >/= 30 ml/min? Yes.    Patient's GFR today is >60 3. Is SCr </= 2? Yes.   Patient's SCr is 0.45 mg/dL 4. Did SCr increase >/= 0.5 in 24 hours? No. 5.Pt's weight >40kg  Yes.   6. Abnormal electrolyte(s): potassium 3.5  7. Electrolytes replaced per protocol 8.  Call MD STAT for K+ </= 2.5, Phos </= 1, or Mag </= 1 Physician:  protocol  Claretta JINNY Sharps 03/11/2024 4:53 AM

## 2024-03-11 NOTE — Progress Notes (Signed)
 NAME:  Garrett [name], MRN:  [id number], DOB:  [birth year], LOS: 136 ADMISSION DATE:  10/27/2023, CONSULTATION DATE:  10/27/2023 REFERRING MD: Regalado - TRH, CHIEF COMPLAINT:  Found down   History of Present Illness:  48 y/o man who was found down at home. PMHx significant for HTN.  Patient was down for an unknown amount of time, found by his fiance when she came home from work, reportedly with agonal breathing. Patient had driven her to work the morning of admission.  He has HTN but never checks his BP and does not follow with MD. Patient's fiance states that she can tell when his BP is really high; his eyes get blood shot and he is sweating. No h/o seizures. Besides almost daily marijuana and cigarette smoking, she is not aware of any other drug use. Drug screen positive for opioids and he was given several doses of Narcan . 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 BP in ED 262/156. CXR revealed RUL and perihilar infiltrates suggesting aspiration pneumonia. ED labs also revealed PH 7.169, LA 4.4, CPK 985. Patient was intubated in ED.  PCCM consulted.  Pertinent Medical History:  Hypertension  Significant Hospital Events: Including procedures, antibiotic start and stop dates in addition to other pertinent events   3/08 - Intubated in ED, pontine bleed/SAH. CT head 17:09 Acute large hemorrhage 4.1 cm in pons with intraventricular extension without hydrocephalus 3/09 - CTA 03:14 AM No change in IPH in pons, similar biparietal [facility] 3/14 - Now awake, appears locked in can follow commands with eyes. 3/16 - Purposeful with left upper extremity  3/22 - Tracheostomy placed at bedside, bleeding issues overnight from trach site 3/24 - PEG (Dr. Sebastian) 3/24 - Sputum culture with MRSA 3/27  - Vomited. TF on hold. Abd film c/w ileus. Added reglan , got SSE. Did tolerate PSV all day  3/28 - BMs x2 after SSE day prior. Added back TFs at 1/2 rated. Tolerated PS 3/29 - Tolerated PS  3/31 - Tolerating CPAP PS 15/5, TF on hold due to ileus. 4/01 - Con't to hold tube feeds today, restarting vancomycin  and sending tracheal aspirate as peaks are 37, plat 24, driving 19. Thick secretions. Fever overnight.  4/02 - Peaks improved. Ongoing hiccups. Trickle feeding. Neuro exam unchanged.  4/20 - Trach was changed to cuffless #8 4/28 - Not safe to swallow. Limited communication and he follows simple commands. On TF 5/01 - On TC 28%, with large secretions. Working with speech and he is improving to initiate PMV trials under ST supervision   5/05 - On TC 30%, 7 L/min. Trach #6 cuffless, changed 5/1 to facilitate PMV trials  5/12 - On TC 21%, 6 L/min. Trach #6 cuffless, unable to produce sounds or speak on PMV. No significant resp secretions. On PEG TF 6/10 - Tracheostomy was removed due to failure to well provide adequate airway 02/15/2024 pulmonary critical care consult. 7/07 Patient obtunded and desaturating on NRB; transfer to icu for intubation; updated mother over phone who wants to reverse code status to full code; ent consulted 7/09 Remains intubated on no continuous sedation, right eye open but does not track movement  7/16 Revision ENT trach with [facility] Flap 7/18 Weaned on vent 5 hours  7/19 Tolerated trach collar for the majority of the day but had some increased work of breath  resulting in being placed back on vent support  7/20 No issues overnight, back on ATC this AM 7/21 Did not tolerate ATC. Placed back on PRVC.  Interim History / Subjective:  No significant events overnight Did not tolerate ATC yesterday, will reattempt again today Tolerating as of this AM with mild tachypnea Weakly following commands with LUE, wiggling toes, attempted to protrude tongue  Objective:   Blood  pressure (!) 113/92, pulse 82, temperature 99 F (37.2 C), temperature source Axillary, resp. rate 20, height 5' 8 (1.727 m), weight 98 kg, SpO2 99%.    Vent Mode: PRVC FiO2 (%):  [40 %] 40 % Set Rate:  [20 bmp] 20 bmp Vt Set:  [540 mL] 540 mL PEEP:  [5 cmH20] 5 cmH20 Plateau Pressure:  [24 cmH20-29 cmH20] 27 cmH20   Intake/Output Summary (Last 24 hours) at 03/11/2024 0729 Last data filed at 03/10/2024 1800 Gross per 24 hour  Intake 724.08 ml  Output 650 ml  Net 74.08 ml   Filed Weights   03/07/24 0500 03/10/24 0500 03/11/24 0500  Weight: 97.8 kg 96 kg 98 kg   Physical Examination: General: Acute-on-chronically ill-appearing man in NAD. Appears mildly uncomfortable. HEENT: [place]/AT, anicteric sclera, PERRL, moist mucous membranes. Neck: Tracheostomy in place (permanent), no erythema/swelling, no significant secretions. Neuro: Awake, alert when speaking to patient, unable to assess orientation. Tracks examiner with verbal stimuli. Following commands intermittently with LUE. +Cough and +Gag  CV: RRR, no m/g/r. PULM: Breathing mildly tachypneic and mildly labored on ATC. Lung fields diminished on L, clear on R with exception of R base. GI: Soft, nontender, nondistended. Normoactive bowel sounds. Extremities: Bilateral symmetric 1-2+ LE/UE edema noted. Skin: Warm/dry, no rashes.  Resolved Problem List:  Post sedation hypotension Left eye conjunctivitis - resolved  Hyperglycemia, resolved  Assessment and Plan:   Acute hypoxic respiratory failure due to aspiration pneumonia w/ MRSA and Klebsiella PNA  S/p tracheostomy placement 3/22, decannulated on 6/10 due to inability to pass suction catheter; reintubated on 7/7, re-do trach 7/16 by ENT with Bjork flap (Dr. Tobie). Completed 7 days of cefepime  and vancomycin  7/9. 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  - Neuroprotective measures: HOB > 30 degrees, normoglycemia, normothermia, electrolytes WNL - Minimizing sedating medications as able - Nutrition/bowel regimen - PT/OT/SLP as able  Complicated E. Coli UTI Discussed potential placement of suprapubic catheter with urology 7/9 recommendations made to establish discharge disposition (patient will likely need to remain inpatient upwards of 6 weeks post procedure ) and family agreeance prior to formal consult.  Called mother 7/9 mother does NOT wish to proceed at this time. - Maintain Foley; next exchange planned for 8/12 - Off of antibiotics since 7/9 - Re-engage Urology PRN  Moderate protein calorie malnutrition Dysphagia - TF per PEG tube - NPO/aspiration precautions - Bowel regimen - Appreciate nutrition/RD assistance  HTN - Cardiac monitoring - Continue Norvasc , Lopressor   Elevated LFTs, improved 7/13 - Trend intermittently, last checked 7/15 and near normal  Macrocytic anemia  Thrombocytosis  No report of chronic alcohol  use. B12, folate and TSH normal - Trend H&H -  Monitor for signs of active bleeding - Transfuse for Hgb < 7.0 or hemodynamically significant bleeding  Poor Dentition Upper right teeth and one left lower tooth have chipped off. - F/u as outpatient  Best Practice (right click and Reselect all SmartList Selections daily)   Diet/type: tubefeeds and NPO w/ meds via tube DVT prophylaxis: LMWH GI prophylaxis: H2B Lines: N/A and yes and it is still needed RUE DL PICC 7/7 > Foley:  Yes, and it is still needed Code Status:  full code Last date of multidisciplinary goals of care discussion 7/8, would benefit from repeat discussion  Critical care time:   The  patient is critically ill with multiple organ system failure and requires high complexity decision making for assessment and support, frequent evaluation and titration of therapies, advanced monitoring, review of radiographic studies and interpretation of complex data.   Critical Care Time devoted to patient care services, exclusive of separately billable procedures, described in this note is 34 minutes.  Corean CHRISTELLA Vayla Wilhelmi, PA-C [facility] 03/11/24 7:29 AM  Please see [email or web address] for pager details.  From 7A-7P if no response, please call [phone] After hours, please call ELink [phone]

## 2024-03-11 NOTE — Plan of Care (Signed)
 Problem: Intracerebral Hemorrhage Tissue Perfusion: Goal: Complications of Intracerebral Hemorrhage will be minimized 03/11/2024 1706 by Honore Marval LABOR, RN Outcome: Not Progressing 03/11/2024 1247 by Honore Marval LABOR, RN Outcome: Not Progressing   Problem: Nutrition: Goal: Risk of aspiration will decrease 03/11/2024 1706 by Honore Marval LABOR, RN Outcome: Not Progressing 03/11/2024 1247 by Honore Marval LABOR, RN Outcome: Not Progressing Goal: Dietary intake will improve 03/11/2024 1706 by Honore Marval LABOR, RN Outcome: Not Progressing 03/11/2024 1247 by Honore Marval LABOR, RN Outcome: Not Progressing   Problem: Fluid Volume: Goal: Ability to maintain a balanced intake and output will improve 03/11/2024 1706 by Honore Marval LABOR, RN Outcome: Not Progressing 03/11/2024 1247 by Honore Marval LABOR, RN Outcome: Not Progressing   Problem: Skin Integrity: Goal: Risk for impaired skin integrity will decrease 03/11/2024 1706 by Honore Marval LABOR, RN Outcome: Not Progressing 03/11/2024 1247 by Honore Marval LABOR, RN Outcome: Not Progressing   Problem: Tissue Perfusion: Goal: Adequacy of tissue perfusion will improve 03/11/2024 1706 by Honore Marval LABOR, RN Outcome: Not Progressing 03/11/2024 1247 by Honore Marval LABOR, RN Outcome: Not Progressing   Problem: Clinical Measurements: Goal: Ability to maintain clinical measurements within normal limits will improve 03/11/2024 1706 by Honore Marval LABOR, RN Outcome: Not Progressing 03/11/2024 1247 by Honore Marval LABOR, RN Outcome: Not Progressing Goal: Will remain free from infection 03/11/2024 1706 by Honore Marval LABOR, RN Outcome: Not Progressing 03/11/2024 1247 by Honore Marval LABOR, RN Outcome: Not Progressing Goal: Diagnostic test results will improve 03/11/2024 1706 by Honore Marval LABOR, RN Outcome: Not Progressing 03/11/2024 1247 by Honore Marval LABOR, RN Outcome: Not Progressing Goal: Respiratory complications will improve 03/11/2024 1706 by Honore Marval LABOR, RN Outcome: Not  Progressing 03/11/2024 1247 by Honore Marval LABOR, RN Outcome: Not Progressing Goal: Cardiovascular complication will be avoided 03/11/2024 1706 by Honore Marval LABOR, RN Outcome: Not Progressing 03/11/2024 1247 by Honore Marval LABOR, RN Outcome: Not Progressing   Problem: Activity: Goal: Risk for activity intolerance will decrease 03/11/2024 1706 by Honore Marval LABOR, RN Outcome: Not Progressing 03/11/2024 1247 by Honore Marval LABOR, RN Outcome: Not Progressing   Problem: Nutrition: Goal: Adequate nutrition will be maintained 03/11/2024 1706 by Honore Marval LABOR, RN Outcome: Not Progressing 03/11/2024 1247 by Honore Marval LABOR, RN Outcome: Not Progressing   Problem: Coping: Goal: Level of anxiety will decrease 03/11/2024 1706 by Honore Marval LABOR, RN Outcome: Not Progressing 03/11/2024 1247 by Honore Marval LABOR, RN Outcome: Not Progressing   Problem: Elimination: Goal: Will not experience complications related to bowel motility 03/11/2024 1706 by Honore Marval LABOR, RN Outcome: Not Progressing 03/11/2024 1247 by Honore Marval LABOR, RN Outcome: Not Progressing Goal: Will not experience complications related to urinary retention 03/11/2024 1706 by Honore Marval LABOR, RN Outcome: Not Progressing 03/11/2024 1247 by Honore Marval LABOR, RN Outcome: Not Progressing   Problem: Pain Managment: Goal: General experience of comfort will improve and/or be controlled 03/11/2024 1706 by Honore Marval LABOR, RN Outcome: Not Progressing 03/11/2024 1247 by Honore Marval LABOR, RN Outcome: Not Progressing   Problem: Safety: Goal: Ability to remain free from injury will improve 03/11/2024 1706 by Honore Marval LABOR, RN Outcome: Not Progressing 03/11/2024 1247 by Honore Marval LABOR, RN Outcome: Not Progressing   Problem: Skin Integrity: Goal: Risk for impaired skin integrity will decrease 03/11/2024 1706 by Honore Marval LABOR, RN Outcome: Not Progressing 03/11/2024 1247 by Honore Marval LABOR, RN Outcome: Not Progressing   Problem:  Activity: Goal: Ability to tolerate increased activity will improve 03/11/2024 1706 by Honore,  Marval LABOR, RN Outcome: Not Progressing 03/11/2024 1247 by Honore Marval LABOR, RN Outcome: Not Progressing   Problem: Respiratory: Goal: Patent airway maintenance will improve 03/11/2024 1706 by Honore Marval LABOR, RN Outcome: Not Progressing 03/11/2024 1247 by Honore Marval LABOR, RN Outcome: Not Progressing   Problem: Activity: Goal: Ability to tolerate increased activity will improve 03/11/2024 1706 by Honore Marval LABOR, RN Outcome: Not Progressing 03/11/2024 1247 by Honore Marval LABOR, RN Outcome: Not Progressing   Problem: Respiratory: Goal: Ability to maintain a clear airway and adequate ventilation will improve 03/11/2024 1706 by Honore Marval LABOR, RN Outcome: Not Progressing 03/11/2024 1247 by Honore Marval LABOR, RN Outcome: Not Progressing   Problem: Role Relationship: Goal: Method of communication will improve 03/11/2024 1706 by Honore Marval LABOR, RN Outcome: Not Progressing 03/11/2024 1247 by Honore Marval LABOR, RN Outcome: Not Progressing

## 2024-03-11 NOTE — Progress Notes (Signed)
 Physical Therapy Treatment Patient Details Name: Garrett [name] MRN: [id number] DOB: [birth year] Today's Date: 03/11/2024   History of Present Illness Pt is a 48 yo male who was found down 10/27/23 with agonal breathing. 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 Intubated 3/8, cortrak placed 3/14, trach placed 3/22, PEG placed 3/24. Reintubated 7/72025 and on ATC on 7/19. PMH: HTN, smoker    PT Comments  Pt tolerated session well, demonstrating some activation in L hand musculature when asked to perform thumbs up, and some initiation and activation with cervical rotation. Pt was able to sit edge of bed with total A+2 and underwent PROM stretches to R hip in supine. PT will continue to treat pt while he is admitted. Patient will benefit from continued inpatient follow up therapy, <3 hours/day     If plan is discharge home, recommend the following: Two people to help with walking and/or transfers;Two people to help with bathing/dressing/bathroom;Assistance with cooking/housework;Assistance with feeding;Direct supervision/assist for medications management;Direct supervision/assist for financial management;Assist for transportation;Help with stairs or ramp for entrance;Supervision due to cognitive status   Can travel by private vehicle        Equipment Recommendations  Gleason lift;Hospital bed;Wheelchair (measurements PT);Wheelchair cushion (measurements PT);BSC/3in1;Other (comment) (pt would potentially need a custom power wheelchair with either gaze control if consistency of gaze improves, or that can be controlled via switch technology)    Recommendations for Other Services       Precautions / Restrictions Precautions Precautions: Fall Recall of Precautions/Restrictions: Impaired Precaution/Restrictions Comments: PEG, SBP <  160 Required Braces or Orthoses: Other Brace Splint/Cast: Bil resting hand splints Restrictions Weight Bearing Restrictions Per Provider Order: No     Mobility  Bed Mobility Overal bed mobility: Needs Assistance Bed Mobility: Supine to Sit, Sit to Supine     Supine to sit: Total assist, +2 for physical assistance Sit to supine: Total assist, +2 for physical assistance   General bed mobility comments: Pt unable to initiate, requiring total A +2 utilzing bed pad for all bed mobility.    Transfers                        Ambulation/Gait                   Stairs             Wheelchair Mobility     Tilt Bed    Modified Rankin (Stroke Patients Only)       Balance Overall balance assessment: Needs assistance Sitting-balance support: Feet supported, Bilateral upper extremity supported Sitting balance-Leahy Scale: Zero Sitting balance - Comments: total A for maintaining upright sitting EOB Postural control: Posterior lean                                  Communication Communication Communication: Impaired Factors Affecting Communication: Difficulty expressing self;Trach/intubated  Cognition Arousal: Alert, Lethargic Behavior During Therapy: Flat affect   PT - Cognitive impairments: Difficult to assess Difficult to assess due to: Impaired communication                     PT - Cognition Comments: Pt's R eye open throughout session, moving vertically, w/ some fixation when asked to complete a task. Following commands: Impaired Following  commands impaired: Follows one step commands with increased time, Follows one step commands inconsistently    Cueing Cueing Techniques: Verbal cues, Tactile cues, Visual cues  Exercises General Exercises - Lower Extremity Heel Slides: PROM, Right, 5 reps, Supine Hip ABduction/ADduction: PROM, Right, 5 reps, Supine Straight Leg Raises: PROM, Right, 5 reps, Supine Other Exercises Other  Exercises: cervical rotation AAROM R and L x3 for 15s holds Other Exercises: pec stretch 3x30s holds    General Comments General comments (skin integrity, edema, etc.): no signs of acute distress. on 40% Fi02 Trach Collar. pt able to initiate cervical rotation R>L, and demonstrates some L finger flexion and thumb ABD when asked to give a thumbs up      Pertinent Vitals/Pain Pain Assessment Pain Assessment: Faces Faces Pain Scale: No hurt Pain Intervention(s): Monitored during session    Home Living                          Prior Function            PT Goals (current goals can now be found in the care plan section) Acute Rehab PT Goals Patient Stated Goal: unable to state, no visitors present PT Goal Formulation: Patient unable to participate in goal setting Time For Goal Achievement: 03/26/24 Potential to Achieve Goals: Poor Progress towards PT goals: Progressing toward goals    Frequency    Min 1X/week      PT Plan      Co-evaluation              AM-PAC PT 6 Clicks Mobility   Outcome Measure  Help needed turning from your back to your side while in a flat bed without using bedrails?: Total Help needed moving from lying on your back to sitting on the side of a flat bed without using bedrails?: Total Help needed moving to and from a bed to a chair (including a wheelchair)?: Total Help needed standing up from a chair using your arms (e.g., wheelchair or bedside chair)?: Total Help needed to walk in hospital room?: Total Help needed climbing 3-5 steps with a railing? : Total 6 Click Score: 6    End of Session Equipment Utilized During Treatment: Oxygen Activity Tolerance: Patient tolerated treatment well Patient left: in bed;with call bell/phone within reach;with bed alarm set Nurse Communication: Mobility status PT Visit Diagnosis: Muscle weakness (generalized) (M62.81);Other symptoms and signs involving the nervous system (R29.898);Hemiplegia  and hemiparesis Hemiplegia - Right/Left: Right Hemiplegia - dominant/non-dominant: Dominant Hemiplegia - caused by: Nontraumatic intracerebral hemorrhage     Time: 8571-8497 PT Time Calculation (min) (ACUTE ONLY): 34 min  Charges:    $Therapeutic Exercise: 8-22 mins $Therapeutic Activity: 8-22 mins PT General Charges $$ ACUTE PT VISIT: 1 Visit                     Leontine Hilt, SPT Acute Rehab [phone]    Leontine Hilt 03/11/2024, 5:41 PM

## 2024-03-11 NOTE — Plan of Care (Signed)
  Problem: Intracerebral Hemorrhage Tissue Perfusion: Goal: Complications of Intracerebral Hemorrhage will be minimized Outcome: Not Progressing   Problem: Nutrition: Goal: Risk of aspiration will decrease Outcome: Not Progressing Goal: Dietary intake will improve Outcome: Not Progressing   Problem: Fluid Volume: Goal: Ability to maintain a balanced intake and output will improve Outcome: Not Progressing   Problem: Skin Integrity: Goal: Risk for impaired skin integrity will decrease Outcome: Not Progressing   Problem: Tissue Perfusion: Goal: Adequacy of tissue perfusion will improve Outcome: Not Progressing   Problem: Clinical Measurements: Goal: Ability to maintain clinical measurements within normal limits will improve Outcome: Not Progressing Goal: Will remain free from infection Outcome: Not Progressing Goal: Diagnostic test results will improve Outcome: Not Progressing Goal: Respiratory complications will improve Outcome: Not Progressing Goal: Cardiovascular complication will be avoided Outcome: Not Progressing   Problem: Activity: Goal: Risk for activity intolerance will decrease Outcome: Not Progressing   Problem: Nutrition: Goal: Adequate nutrition will be maintained Outcome: Not Progressing   Problem: Coping: Goal: Level of anxiety will decrease Outcome: Not Progressing   Problem: Elimination: Goal: Will not experience complications related to bowel motility Outcome: Not Progressing Goal: Will not experience complications related to urinary retention Outcome: Not Progressing   Problem: Pain Managment: Goal: General experience of comfort will improve and/or be controlled Outcome: Not Progressing   Problem: Safety: Goal: Ability to remain free from injury will improve Outcome: Not Progressing   Problem: Skin Integrity: Goal: Risk for impaired skin integrity will decrease Outcome: Not Progressing   Problem: Activity: Goal: Ability to tolerate  increased activity will improve Outcome: Not Progressing   Problem: Respiratory: Goal: Patent airway maintenance will improve Outcome: Not Progressing   Problem: Activity: Goal: Ability to tolerate increased activity will improve Outcome: Not Progressing   Problem: Respiratory: Goal: Ability to maintain a clear airway and adequate ventilation will improve Outcome: Not Progressing   Problem: Role Relationship: Goal: Method of communication will improve Outcome: Not Progressing

## 2024-03-12 DIAGNOSIS — N390 Urinary tract infection, site not specified: Secondary | ICD-10-CM | POA: Diagnosis not present

## 2024-03-12 DIAGNOSIS — R1310 Dysphagia, unspecified: Secondary | ICD-10-CM | POA: Diagnosis not present

## 2024-03-12 DIAGNOSIS — E440 Moderate protein-calorie malnutrition: Secondary | ICD-10-CM | POA: Diagnosis not present

## 2024-03-12 DIAGNOSIS — F17210 Nicotine dependence, cigarettes, uncomplicated: Secondary | ICD-10-CM | POA: Diagnosis not present

## 2024-03-12 DIAGNOSIS — J9601 Acute respiratory failure with hypoxia: Secondary | ICD-10-CM | POA: Diagnosis not present

## 2024-03-12 DIAGNOSIS — J9691 Respiratory failure, unspecified with hypoxia: Secondary | ICD-10-CM | POA: Diagnosis not present

## 2024-03-12 DIAGNOSIS — Z930 Tracheostomy status: Secondary | ICD-10-CM | POA: Diagnosis not present

## 2024-03-12 DIAGNOSIS — B9620 Unspecified Escherichia coli [E. coli] as the cause of diseases classified elsewhere: Secondary | ICD-10-CM | POA: Diagnosis not present

## 2024-03-12 DIAGNOSIS — Z430 Encounter for attention to tracheostomy: Secondary | ICD-10-CM | POA: Diagnosis not present

## 2024-03-12 DIAGNOSIS — I619 Nontraumatic intracerebral hemorrhage, unspecified: Secondary | ICD-10-CM | POA: Diagnosis not present

## 2024-03-12 DIAGNOSIS — D509 Iron deficiency anemia, unspecified: Secondary | ICD-10-CM | POA: Diagnosis not present

## 2024-03-12 LAB — CBC WITH DIFFERENTIAL/PLATELET
Abs Immature Granulocytes: 0.04 K/uL (ref 0.00–0.07)
Basophils Absolute: 0 K/uL (ref 0.0–0.1)
Basophils Relative: 0 %
Eosinophils Absolute: 0.2 K/uL (ref 0.0–0.5)
Eosinophils Relative: 2 %
HCT: 27.8 % — ABNORMAL LOW (ref 39.0–52.0)
Hemoglobin: 8.1 g/dL — ABNORMAL LOW (ref 13.0–17.0)
Immature Granulocytes: 0 %
Lymphocytes Relative: 10 %
Lymphs Abs: 1.1 K/uL (ref 0.7–4.0)
MCH: 28.5 pg (ref 26.0–34.0)
MCHC: 29.1 g/dL — ABNORMAL LOW (ref 30.0–36.0)
MCV: 97.9 fL (ref 80.0–100.0)
Monocytes Absolute: 1 K/uL (ref 0.1–1.0)
Monocytes Relative: 9 %
Neutro Abs: 8.9 K/uL — ABNORMAL HIGH (ref 1.7–7.7)
Neutrophils Relative %: 79 %
Platelets: 465 K/uL — ABNORMAL HIGH (ref 150–400)
RBC: 2.84 MIL/uL — ABNORMAL LOW (ref 4.22–5.81)
RDW: 16.7 % — ABNORMAL HIGH (ref 11.5–15.5)
WBC: 11.3 K/uL — ABNORMAL HIGH (ref 4.0–10.5)
nRBC: 0 % (ref 0.0–0.2)

## 2024-03-12 LAB — BASIC METABOLIC PANEL WITH GFR
Anion gap: 9 (ref 5–15)
BUN: 17 mg/dL (ref 6–20)
CO2: 28 mmol/L (ref 22–32)
Calcium: 8.9 mg/dL (ref 8.9–10.3)
Chloride: 104 mmol/L (ref 98–111)
Creatinine, Ser: 0.51 mg/dL — ABNORMAL LOW (ref 0.61–1.24)
GFR, Estimated: >60 mL/min (ref >60)
Glucose, Bld: 142 mg/dL — ABNORMAL HIGH (ref 70–99)
Potassium: 3.7 mmol/L (ref 3.5–5.1)
Sodium: 141 mmol/L (ref 135–145)

## 2024-03-12 LAB — GLUCOSE, CAPILLARY
Glucose-Capillary: 107 mg/dL — ABNORMAL HIGH (ref 70–99)
Glucose-Capillary: 108 mg/dL — ABNORMAL HIGH (ref 70–99)
Glucose-Capillary: 111 mg/dL — ABNORMAL HIGH (ref 70–99)
Glucose-Capillary: 118 mg/dL — ABNORMAL HIGH (ref 70–99)
Glucose-Capillary: 119 mg/dL — ABNORMAL HIGH (ref 70–99)

## 2024-03-12 MED ORDER — ERYTHROMYCIN 5 MG/GM OP OINT
TOPICAL_OINTMENT | Freq: Three times a day (TID) | OPHTHALMIC | Status: DC
  Administered 2024-03-12 – 2024-03-31 (×53): 1 via OPHTHALMIC
  Filled 2024-03-12 (×10): qty 1

## 2024-03-12 MED ORDER — POTASSIUM CHLORIDE 20 MEQ PO PACK
40.0000 meq | PACK | Freq: Once | ORAL | Status: AC
  Administered 2024-03-12: 40 meq
  Filled 2024-03-12: qty 2

## 2024-03-12 MED ORDER — INSULIN ASPART 100 UNIT/ML IJ SOLN: 3.0000 [IU] | Freq: Four times a day (QID) | INTRAMUSCULAR | Status: DC | PRN

## 2024-03-12 MED ORDER — INSULIN ASPART 100 UNIT/ML IJ SOLN: 0.0000 [IU] | INTRAMUSCULAR | Status: DC

## 2024-03-12 NOTE — Progress Notes (Signed)
 [facility] ADULT ICU REPLACEMENT PROTOCOL   The patient does apply for the [facility] Adult ICU Electrolyte Replacment Protocol based on the criteria listed below:   1.Exclusion criteria: TCTS, ECMO, Dialysis, and Myasthenia Gravis patients 2. Is GFR >/= 30 ml/min? Yes.    Patient's GFR today is >60 3. Is SCr </= 2? Yes.   Patient's SCr is 3.7 mg/dL 4. Did SCr increase >/= 0.5 in 24 hours? No. 5.Pt's weight >40kg  Yes.   6. Abnormal electrolyte(s): potassium 3.7  7. Electrolytes replaced per protocol 8.  Call MD STAT for K+ </= 2.5, Phos </= 1, or Mag </= 1 Physician:  protocol  Claretta JINNY Sharps 03/12/2024 5:59 AM

## 2024-03-12 NOTE — Plan of Care (Signed)
  Problem: Nutrition: Goal: Dietary intake will improve Outcome: Progressing   Problem: Fluid Volume: Goal: Ability to maintain a balanced intake and output will improve Outcome: Progressing   Problem: Clinical Measurements: Goal: Respiratory complications will improve Outcome: Progressing   Problem: Nutrition: Goal: Adequate nutrition will be maintained Outcome: Progressing   Problem: Pain Managment: Goal: General experience of comfort will improve and/or be controlled Outcome: Progressing   Problem: Safety: Goal: Ability to remain free from injury will improve Outcome: Progressing

## 2024-03-12 NOTE — Progress Notes (Signed)
 NAME:  Darrel Gloss, MRN:  [id number], DOB:  25-May-1976, LOS: 137 ADMISSION DATE:  10/27/2023, CONSULTATION DATE:  10/27/2023 REFERRING MD: Regalado - TRH, CHIEF COMPLAINT:  Found down   History of Present Illness:  48 y/o man who was found down at home. PMHx significant for HTN.  Patient was down for an unknown amount of time, found by his fiance when she came home from work, reportedly with agonal breathing. Patient had driven her to work the morning of admission.  He has HTN but never checks his BP and does not follow with MD. Patient's fiance states that she can tell when his BP is really high; his eyes get blood shot and he is sweating. No h/o seizures. Besides almost daily marijuana and cigarette smoking, she is not aware of any other drug use. Drug screen positive for opioids and he was given several doses of Narcan . 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 BP in ED 262/156. CXR revealed RUL and perihilar infiltrates suggesting aspiration pneumonia. ED labs also revealed PH 7.169, LA 4.4, CPK 985. Patient was intubated in ED.  PCCM consulted.  Pertinent Medical History:  Hypertension  Significant Hospital Events: Including procedures, antibiotic start and stop dates in addition to other pertinent events   3/08 - Intubated in ED, pontine bleed/SAH. CT head 17:09 Acute large hemorrhage 4.1 cm in pons with intraventricular extension without hydrocephalus 3/09 - CTA 03:14 AM No change in IPH in pons, similar biparietal [facility] 3/14 - Now awake, appears locked in can follow commands with eyes. 3/16 - Purposeful with left upper extremity  3/22 - Tracheostomy placed at bedside, bleeding issues overnight from trach site 3/24 - PEG (Dr. Sebastian) 3/24 - Sputum culture with MRSA 3/27  - Vomited. TF on hold. Abd film c/w ileus. Added reglan , got SSE. Did tolerate PSV all day  3/28 - BMs x2 after SSE day prior. Added back TFs at 1/2 rated. Tolerated PS 3/29 - Tolerated PS  3/31 - Tolerating CPAP PS 15/5, TF on hold due to ileus. 4/01 - Con't to hold tube feeds today, restarting vancomycin  and sending tracheal aspirate as peaks are 37, plat 24, driving 19. Thick secretions. Fever overnight.  4/02 - Peaks improved. Ongoing hiccups. Trickle feeding. Neuro exam unchanged.  4/20 - Trach was changed to cuffless #8 4/28 - Not safe to swallow. Limited communication and he follows simple commands. On TF 5/01 - On TC 28%, with large secretions. Working with speech and he is improving to initiate PMV trials under ST supervision   5/05 - On TC 30%, 7 L/min. Trach #6 cuffless, changed 5/1 to facilitate PMV trials  5/12 - On TC 21%, 6 L/min. Trach #6 cuffless, unable to produce sounds or speak on PMV. No significant resp secretions. On PEG TF 6/10 - Tracheostomy was removed due to failure to well provide adequate airway 02/15/2024 pulmonary critical care consult. 7/07 Patient obtunded and desaturating on NRB; transfer to icu for intubation; updated mother over phone who wants to reverse code status to full code; ent consulted 7/09 Remains intubated on no continuous sedation, right eye open but does not track movement  7/16 Revision ENT trach with [facility] Flap 7/18 Weaned on vent 5 hours  7/19 Tolerated trach collar for the majority of the day but had some increased work of breath  resulting in being placed back on vent support  7/20 No issues overnight, back on ATC this AM 7/21 Did not tolerate ATC. Placed back on PRVC.  Interim History / Subjective:  Placed back on full vent support overnight due to increased WOB Tolerated ATC for a significant portion of the day yesterday Appears comfortable, sleeping on rounds this AM Stable vent settings  Objective:   Blood pressure (!) 123/97, pulse  92, temperature 98 F (36.7 C), temperature source Axillary, resp. rate 20, height 5' 8 (1.727 m), weight 98 kg, SpO2 99%.    Vent Mode: PRVC FiO2 (%):  [40 %] 40 % Set Rate:  [20 bmp] 20 bmp Vt Set:  [540 mL] 540 mL PEEP:  [5 cmH20] 5 cmH20 Plateau Pressure:  [30 cmH20] 30 cmH20   Intake/Output Summary (Last 24 hours) at 03/12/2024 0751 Last data filed at 03/12/2024 0715 Gross per 24 hour  Intake 1413.75 ml  Output 1645 ml  Net -231.25 ml   Filed Weights   03/07/24 0500 03/10/24 0500 03/11/24 0500  Weight: 97.8 kg 96 kg 98 kg   Physical Examination: General: Acute-on-chronically ill-appearing man in NAD. HEENT: [place]/AT, anicteric sclera, PERRL, moist mucous membranes. Neuro: Drowsy, opens eyes to voice. Control and instrumentation engineer. +Roving eye movements. Opens eyes to verbal stimuli. Following commands intermittently. No spontaneous movement of extremities. +Cough and +Gag  CV: RRR, no m/g/r. PULM: Breathing even and unlabored on vent (PEEP 5, FiO2 40%). Lung fields coarse throughout, L > R with diminished air movement on L. GI: Soft, nontender, nondistended. Normoactive bowel sounds. Extremities: Bilateral symmetric 1+ LE/UE edema noted. Skin: Warm/dry, no rashes.  Resolved Problem List:  Post sedation hypotension Left eye conjunctivitis - resolved  Hyperglycemia, resolved  Assessment and Plan:   Acute hypoxic respiratory failure due to aspiration pneumonia w/ MRSA and Klebsiella PNA  S/p tracheostomy placement 3/22, decannulated on 6/10 due to inability to pass suction catheter; reintubated on 7/7, re-do trach 7/16 by ENT with Bjork flap (Dr. Tobie). Completed 7 days of cefepime  and vancomycin  7/9. 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  - Neuroprotective measures: HOB > 30 degrees, normoglycemia, normothermia, electrolytes WNL - Minimizing sedating medications as able - Nutrition/bowel regimen - PT/OT/SLP as able  Complicated E. Coli UTI Discussed potential placement of suprapubic catheter with urology 7/9 recommendations made to establish discharge disposition (patient will likely need to remain inpatient upwards of 6 weeks post procedure ) and family agreeance prior to formal consult.  Called mother 7/9 mother does NOT wish to proceed at this time. - Maintain Foley; next exchange planned for 8/12 - Off of antibiotics since 7/9 - Re-engage Urology PRN  Moderate protein calorie malnutrition Dysphagia - TF per PEG - NPO/aspiration precautions - Bowel regimen - Appreciate Nutrition/RD assistance  HTN - Cardiac monitoring - Continue Norvasc , Lopressor   Elevated LFTs, improved 7/13 - Trend intermittently, last checked 7/15 and near normal  Macrocytic anemia  Thrombocytosis  No report of chronic alcohol  use. B12, folate and TSH normal - Trend H&H - Monitor for signs of active bleeding -  Transfuse for Hgb < 7.0 or hemodynamically significant bleeding  Poor Dentition Upper right teeth and one left lower tooth have chipped off. - F/u as outpatient  Best Practice (right click and Reselect all SmartList Selections daily)   Diet/type: tubefeeds and NPO w/ meds via tube DVT prophylaxis: LMWH GI prophylaxis: H2B Lines: N/A and yes and it is still needed RUE DL PICC 7/7 > Foley:  Yes, and it is still needed Code Status:  full code Last date of multidisciplinary goals of care discussion 7/8, would benefit from repeat discussion  Critical care time:   The patient is critically ill with multiple organ system failure and requires high complexity  decision making for assessment and support, frequent evaluation and titration of therapies, advanced monitoring, review of radiographic studies and interpretation of complex data.   Critical Care Time devoted to patient care services, exclusive of separately billable procedures, described in this note is 34 minutes.  Corean CHRISTELLA Kaan Tosh, PA-C [facility] 03/12/24 7:51 AM  Please see [email or web address] for pager details.  From 7A-7P if no response, please call [phone] After hours, please call ELink [phone]

## 2024-03-12 NOTE — Progress Notes (Signed)
 Nutrition Follow-up  DOCUMENTATION CODES:  Not applicable  INTERVENTION:  Tube feeding via PEG:  Continue to Jevity 1.5 at 5ml/hr ( per day) 60ml ProSource TF20 BID Provides 2140 kcal, 124g protein, free water  per day MVI with minerals daily  Can consider transitioning back to bolus tube feeding via PEG on follow up given medical stability.  5 cartons ( ) Jevity 1.5 daily 60ml water  flush before and after each bolus ( total) Provide an additional free water  flush of 125ml q6h ( total) 60ml ProSource TF20 TID Provides 2015 kcal, 136g protein, total free water   NUTRITION DIAGNOSIS:  Inadequate oral intake related to inability to eat as evidenced by NPO status. - remains applicable  GOAL:  Patient will meet greater than or equal to 90% of their needs - goal met via TF  MONITOR:  Labs, Weight trends, TF tolerance, Skin  REASON FOR ASSESSMENT:  Consult Enteral/tube feeding initiation and management  ASSESSMENT:  Pt with PMH of HTN, daily mariajuana, and smoker admitted after being found down at home with pontine hemorrhagic stroke and trace SAH. UDS positive for opioids. Noted aspiration PNA on admission.  3/08 - admitted with [facility], intubated 3/14 - s/p cortrak placement (tip gastric) 3/22 - s/p tracheostomy 3/24 - s/p PEG 4/20 - Cuff changed to cuffless 5/10 - PEG dislodged, TF's stopped  5/11 - PEG replaced, TF's restarted at goal  6/10 - trach removed 7/07- desat, transferred back to 4N ICU, re-intubated  7/16 - s/p trach placement  Pt remains intubated via trach. Intermittently switching between trach collar and vent. Currently requiring vent support.   Checked in with patient at bedside. No family/visitors present.  Repeat nutrition focused physical exam performed. No new muscle or subcutaneous fat deficits present on repeat exam. No new wounds documented/reported per RN.   Pt stable on current tube feeding regimen. Per report, pt  with about 2 loose stools daily. Colace and miralax  being held intermittently to aid in reduced of frequency.   Medications: colace BID, pepcid , MVI, miralax  BID  Labs:   Cr 0.51 CBG 119 checked once x24 hours  NUTRITION - FOCUSED PHYSICAL EXAM: Flowsheet Row Most Recent Value  Orbital Region No depletion  Upper Arm Region No depletion  Thoracic and Lumbar Region No depletion  Buccal Region No depletion  Temple Region No depletion  Clavicle Bone Region No depletion  Clavicle and Acromion Bone Region No depletion  Scapular Bone Region No depletion  Dorsal Hand No depletion  Patellar Region No depletion  Anterior Thigh Region No depletion  Posterior Calf Region No depletion  Edema (RD Assessment) Mild  [non-pitting generalized]  Hair Reviewed  Eyes Unable to assess  Mouth Unable to assess  Skin Reviewed  Nails Reviewed    Diet Order:   Diet Order             Diet NPO time specified  Diet effective midnight                   EDUCATION NEEDS:  No education needs have been identified at this time  Skin:  Skin Assessment: Reviewed RN Assessment Skin Integrity Issues:: Other (Comment) Stage II: Stage 2, perineum Other: Non pressure wound, left eye  Last BM:  7/18  Height:  Ht Readings from Last 1 Encounters:  03/05/24 5' 8 (1.727 m)    Weight:  Wt Readings from Last 1 Encounters:  03/11/24 98 kg    Ideal Body Weight:  70 kg  BMI:  Body mass index is 32.85 kg/m.  Estimated Nutritional Needs:   Kcal:  2000-2200  Protein:  125-140 grams  Fluid:  >2 L/day  Garrett [name], RDN, LDN Clinical Nutrition See AMiON for contact information.

## 2024-03-12 NOTE — Progress Notes (Signed)
 Occupational Therapy Treatment Patient Details Name: Garrett [name] MRN: [id number] DOB: 04-08-1976 Today's Date: 03/12/2024   History of present illness Pt is a 48 yo male who was found down 10/27/23 with agonal breathing. 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 Intubated 3/8, cortrak placed 3/14, trach placed 3/22, PEG placed 3/24. Reintubated 7/72025 and trach 7/16.  PMH: HTN, smoker   OT comments  Pt supine in bed, RN reports increased fatigue from ATC trial yesterday.  Pt demonstrating thumbs up and hand grasp on L hand, but minimal compared to last session.  No other movement to L UE noted.  Releasing tape from L hand when cued on 3/3 trials.  Pt does nod head yes when asked if he was tired.   PROM to B Ues provided.  Updated goals, but will need to review next session to ensure appropriateness.  Will follow acutely.       If plan is discharge home, recommend the following:  Other (comment) (total care)   Equipment Recommendations  Wheelchair (measurements OT);Wheelchair cushion (measurements OT);Hospital bed;Hoyer lift (defer)    Recommendations for Other Services      Precautions / Restrictions Precautions Precautions: Fall Recall of Precautions/Restrictions: Impaired Precaution/Restrictions Comments: PEG, trach SBP < 160 Required Braces or Orthoses: Other Brace Splint/Cast: Bil resting hand splints Restrictions Weight Bearing Restrictions Per Provider Order: No       Mobility Bed Mobility Overal bed mobility: Needs Assistance             General bed mobility comments: pt in good position in bed, HOB elevated and RN suctioned    Transfers                         Balance                                           ADL either performed or assessed with clinical  judgement   ADL Overall ADL's : Needs assistance/impaired                                       General ADL Comments: total (A)    Extremity/Trunk Assessment              Vision       Perception     Praxis     Communication Communication Communication: Impaired Factors Affecting Communication: Difficulty expressing self;Trach/intubated   Cognition Arousal: Lethargic Behavior During Therapy: Flat affect Cognition: Difficult to assess Difficult to assess due to: Intubated           OT - Cognition Comments: on vent tia trach, pt with minimal response to commands he typically follows with delay. He does nod his head yes when asked if he was tired.                 Following commands: Impaired Following commands impaired: Follows one step commands with increased time, Follows one step commands inconsistently      Cueing   Cueing Techniques: Verbal cues, Tactile cues, Visual cues  Exercises Exercises: Low Level/ICU General Exercises - Upper Extremity Shoulder Flexion: PROM, Both, 5 reps  Shoulder ABduction: PROM, Both, 5 reps, Seated Shoulder Horizontal ABduction: PROM, Both, 5 reps, Seated Shoulder Horizontal ADduction: PROM, Both, 5 reps, Seated Elbow Flexion: Both, 10 reps, PROM Elbow Extension: PROM, Both, 10 reps Wrist Flexion: Both, 10 reps, PROM Wrist Extension: PROM, Both, 10 reps Digit Composite Flexion: PROM, Both, 10 reps Composite Extension: PROM, Both, 10 reps Other Exercises Other Exercises: L hand grasp and release x 3 with delay, placement of tape in his hand-- minimal movement to drop when cued    Shoulder Instructions       General Comments vent via trach 40% fio2 pep 5    Pertinent Vitals/ Pain       Pain Assessment Pain Assessment: Faces Faces Pain Scale: No hurt Pain Intervention(s): Monitored during session  Home Living                                          Prior  Functioning/Environment              Frequency  Min 1X/week        Progress Toward Goals  OT Goals(current goals can now be found in the care plan section)  Progress towards OT goals: OT to reassess next treatment;Goals updated  Acute Rehab OT Goals Patient Stated Goal: unable OT Goal Formulation: Patient unable to participate in goal setting Time For Goal Achievement: 03/12/24 Potential to Achieve Goals: Fair ADL Goals Pt/caregiver will Perform Home Exercise Program: Left upper extremity;Increased strength Additional ADL Goal #1: Pt will follow 1 step commands with 50% accuracy in a non distracting environment. Additional ADL Goal #3: Pt will use ball squeeze in L hand for accurate yes answer on 5/5 attempts.  Plan      Co-evaluation                 AM-PAC OT 6 Clicks Daily Activity     Outcome Measure   Help from another person eating meals?: Total Help from another person taking care of personal grooming?: Total Help from another person toileting, which includes using toliet, bedpan, or urinal?: Total Help from another person bathing (including washing, rinsing, drying)?: Total Help from another person to put on and taking off regular upper body clothing?: Total Help from another person to put on and taking off regular lower body clothing?: Total 6 Click Score: 6    End of Session Equipment Utilized During Treatment: Oxygen (via vent)  OT Visit Diagnosis: Unsteadiness on feet (R26.81);Muscle weakness (generalized) (M62.81) Hemiplegia - caused by: Other Nontraumatic intracranial hemorrhage   Activity Tolerance Patient limited by fatigue   Patient Left in bed;with call bell/phone within reach;with bed alarm set   Nurse Communication Mobility status;Precautions        Time: 8843-8784 OT Time Calculation (min): 19 min  Charges: OT General Charges $OT Visit: 1 Visit OT Treatments $Therapeutic Activity: 8-22 mins  Etta NOVAK, OT Acute  Rehabilitation Services Office [phone] Secure Chat Preferred    Etta GORMAN [place] 03/12/2024, 2:17 PM

## 2024-03-12 NOTE — Progress Notes (Signed)
 Placed patient back on full vent support due to increased work of breathing.

## 2024-03-12 NOTE — Progress Notes (Signed)
 ENT CONSULT:  Reason for Consult: Tracheostomy   Referring Physician: ICU team   HPI: Garrett [name] is an 48 y.o. 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   Patient was intubated on 10/27/2023 for respiratory failure and underwent trach on 11/10/2023 with trach 12/09/2023 to cuffless and was tolerating trach collar. On ~01/29/2024, eventually tracheostomy was removed due to concerns for accidental decannulation and as such thought did not provide adequate airway. Patient then did somewhat well without vent support with concern for respiratory failure, aspiration and subsequently was re-intubated 02/25/2024. ENT was then consulted for tracheostomy Patient unable to provide history due to intubation/mental status.   --------------------------------------------------------- 03/07/2024: Seen after tracheostomy on 03/05/2024. No issues with tracheostomy including suctioning or bleeding per nursing staff. On vent.  03/12/2024: Seen in follow up. No issues with tracheostomy. No bleeding, no concerns for false passage. On vent   PMHx: Reviewed    Past Surgical History:  Procedure Laterality Date   IR REPLACE G-TUBE SIMPLE WO FLUORO  12/30/2023   TRACHEOSTOMY TUBE PLACEMENT N/A 03/05/2024   Procedure: CREATION, TRACHEOSTOMY;  Surgeon: Tobie Eldora NOVAK, MD;  Location: [facility] OR;  Service: ENT;  Laterality: N/A;    History reviewed. No pertinent family history.  Social History:  reports that he has been smoking cigarettes. He started smoking about 30 years ago. He has a 15.3 pack-year smoking history. He has never used smokeless tobacco. He reports that he does not currently use alcohol . He reports that he does not use drugs.  Allergies:  Allergies  Allergen Reactions   Tomato Hives, Itching and Rash    Medications: I have reviewed the patient's current medications.  Results for orders  placed or performed during the hospital encounter of 10/27/23 (from the past 48 hours)  Basic metabolic panel     Status: Abnormal   Collection Time: 03/10/24  9:28 PM  Result Value Ref Range   Sodium 144 135 - 145 mmol/L   Potassium 3.8 3.5 - 5.1 mmol/L   Chloride 107 98 - 111 mmol/L   CO2 28 22 - 32 mmol/L   Glucose, Bld 122 (H) 70 - 99 mg/dL    Comment: Glucose reference range applies only to samples taken after fasting for at least 8 hours.   BUN 19 6 - 20 mg/dL   Creatinine, Ser 9.55 (L) 0.61 - 1.24 mg/dL   Calcium 8.9 8.9 - 89.6 mg/dL   GFR, Estimated >39 >39 mL/min    Comment: (NOTE) Calculated using the CKD-EPI Creatinine Equation (2021)    Anion gap 9 5 - 15    Comment: Performed at [facility], [address] [place], [name] [address]  CBC     Status: Abnormal   Collection Time: 03/11/24  3:35 AM  Result Value Ref Range   WBC 8.2 4.0 - 10.5 K/uL   RBC 2.71 (L) 4.22 - 5.81 MIL/uL   Hemoglobin 7.6 (L) 13.0 - 17.0 g/dL   HCT 73.7 (L) 60.9 - 47.9 %   MCV 96.7 80.0 - 100.0 fL   MCH 28.0 26.0 - 34.0 pg   MCHC 29.0 (L) 30.0 - 36.0 g/dL   RDW 82.6 (H) 88.4 - 84.4 %   Platelets 464 (H) 150 - 400 K/uL   nRBC 0.2 0.0 - 0.2 %    Comment: Performed at [facility], [address] [place], [name] [address]  Basic metabolic panel  Status: Abnormal   Collection Time: 03/11/24  3:35 AM  Result Value Ref Range   Sodium 141 135 - 145 mmol/L   Potassium 3.5 3.5 - 5.1 mmol/L   Chloride 106 98 - 111 mmol/L   CO2 27 22 - 32 mmol/L   Glucose, Bld 130 (H) 70 - 99 mg/dL    Comment: Glucose reference range applies only to samples taken after fasting for at least 8 hours.   BUN 21 (H) 6 - 20 mg/dL   Creatinine, Ser 9.54 (L) 0.61 - 1.24 mg/dL   Calcium 8.9 8.9 - 89.6 mg/dL   GFR, Estimated >39 >39 mL/min    Comment: (NOTE) Calculated using the CKD-EPI Creatinine Equation (2021)    Anion gap 8 5 - 15    Comment: Performed at [facility], [address],  [place], [name] [address]  Basic metabolic panel     Status: Abnormal   Collection Time: 03/12/24  4:20 AM  Result Value Ref Range   Sodium 141 135 - 145 mmol/L   Potassium 3.7 3.5 - 5.1 mmol/L   Chloride 104 98 - 111 mmol/L   CO2 28 22 - 32 mmol/L   Glucose, Bld 142 (H) 70 - 99 mg/dL    Comment: Glucose reference range applies only to samples taken after fasting for at least 8 hours.   BUN 17 6 - 20 mg/dL   Creatinine, Ser 9.48 (L) 0.61 - 1.24 mg/dL   Calcium 8.9 8.9 - 89.6 mg/dL   GFR, Estimated >39 >39 mL/min    Comment: (NOTE) Calculated using the CKD-EPI Creatinine Equation (2021)    Anion gap 9 5 - 15    Comment: Performed at [facility], [address] [place], [name] [address]  CBC with Differential/Platelet     Status: Abnormal   Collection Time: 03/12/24  4:20 AM  Result Value Ref Range   WBC 11.3 (H) 4.0 - 10.5 K/uL   RBC 2.84 (L) 4.22 - 5.81 MIL/uL   Hemoglobin 8.1 (L) 13.0 - 17.0 g/dL   HCT 72.1 (L) 60.9 - 47.9 %   MCV 97.9 80.0 - 100.0 fL   MCH 28.5 26.0 - 34.0 pg   MCHC 29.1 (L) 30.0 - 36.0 g/dL   RDW 83.2 (H) 88.4 - 84.4 %   Platelets 465 (H) 150 - 400 K/uL   nRBC 0.0 0.0 - 0.2 %   Neutrophils Relative % 79 %   Neutro Abs 8.9 (H) 1.7 - 7.7 K/uL   Lymphocytes Relative 10 %   Lymphs Abs 1.1 0.7 - 4.0 K/uL   Monocytes Relative 9 %   Monocytes Absolute 1.0 0.1 - 1.0 K/uL   Eosinophils Relative 2 %   Eosinophils Absolute 0.2 0.0 - 0.5 K/uL   Basophils Relative 0 %   Basophils Absolute 0.0 0.0 - 0.1 K/uL   Immature Granulocytes 0 %   Abs Immature Granulocytes 0.04 0.00 - 0.07 K/uL    Comment: Performed at [facility], [address] [place], [name] [address]  Glucose, capillary     Status: Abnormal   Collection Time: 03/12/24  7:15 AM  Result Value Ref Range   Glucose-Capillary 119 (H) 70 - 99 mg/dL    Comment: Glucose reference range applies only to samples taken after fasting for at least 8 hours.  Glucose, capillary     Status: Abnormal    Collection Time: 03/12/24 11:11 AM  Result Value Ref Range   Glucose-Capillary 118 (  H) 70 - 99 mg/dL    Comment: Glucose reference range applies only to samples taken after fasting for at least 8 hours.    No results found.   ROS: unable to obtain  Blood pressure (!) 131/105, pulse 84, temperature 99 F (37.2 C), temperature source Axillary, resp. rate 20, height 5' 8 (1.727 m), weight 98 kg, SpO2 100%.  PHYSICAL EXAM: CONSTITUTIONAL: well developed CARDIOVASCULAR: normal rate PULMONARY/CHEST WALL/Neck: tracheostomy in place, cuff up, no significant bleeding surrounding; on vent; suction passes easily HENT: Head : normocephalic Nose: nose without purulence   Studies Reviewed: Prior extensive ICU notes reviewed and most recent labs reviewed CT Neck 10/28/2023 reviewed and interpreted: innominate does not seem extremely high; appears to have relatively normal neck anatomy CXR 03/06/2024 independently reviewed: no pneumothorax, tracheostomy appears to be in place CXR 03/10/2024: Trach positioned about 3 cm above carina, no pneumothorax  Assessment/Plan: 48 y.o. with:   Chronic hypoxic respiratory failure 2.   S/p revision tracheostomy with Bjork flap 03/05/2024   No issues with tracheostomy currently. Stay suture and flange sutures removed Ok for all care per nursing team Please notify ENT when appropriate for tracheostomy change Please notify ENT with questions  Eldora KATHEE Blanch   03/12/2024, 12:16 PM

## 2024-03-13 DIAGNOSIS — N390 Urinary tract infection, site not specified: Secondary | ICD-10-CM | POA: Diagnosis not present

## 2024-03-13 DIAGNOSIS — D509 Iron deficiency anemia, unspecified: Secondary | ICD-10-CM | POA: Diagnosis not present

## 2024-03-13 DIAGNOSIS — B9620 Unspecified Escherichia coli [E. coli] as the cause of diseases classified elsewhere: Secondary | ICD-10-CM | POA: Diagnosis not present

## 2024-03-13 DIAGNOSIS — R1310 Dysphagia, unspecified: Secondary | ICD-10-CM | POA: Diagnosis not present

## 2024-03-13 DIAGNOSIS — Z430 Encounter for attention to tracheostomy: Secondary | ICD-10-CM | POA: Diagnosis not present

## 2024-03-13 DIAGNOSIS — I619 Nontraumatic intracerebral hemorrhage, unspecified: Secondary | ICD-10-CM | POA: Diagnosis not present

## 2024-03-13 DIAGNOSIS — E440 Moderate protein-calorie malnutrition: Secondary | ICD-10-CM | POA: Diagnosis not present

## 2024-03-13 DIAGNOSIS — J9601 Acute respiratory failure with hypoxia: Secondary | ICD-10-CM | POA: Diagnosis not present

## 2024-03-13 DIAGNOSIS — J9620 Acute and chronic respiratory failure, unspecified whether with hypoxia or hypercapnia: Secondary | ICD-10-CM

## 2024-03-13 LAB — CBC WITH DIFFERENTIAL/PLATELET
Abs Immature Granulocytes: 0.02 K/uL (ref 0.00–0.07)
Basophils Absolute: 0 K/uL (ref 0.0–0.1)
Basophils Relative: 0 %
Eosinophils Absolute: 0.2 K/uL (ref 0.0–0.5)
Eosinophils Relative: 2 %
HCT: 25.6 % — ABNORMAL LOW (ref 39.0–52.0)
Hemoglobin: 7.5 g/dL — ABNORMAL LOW (ref 13.0–17.0)
Immature Granulocytes: 0 %
Lymphocytes Relative: 23 %
Lymphs Abs: 1.7 K/uL (ref 0.7–4.0)
MCH: 28.1 pg (ref 26.0–34.0)
MCHC: 29.3 g/dL — ABNORMAL LOW (ref 30.0–36.0)
MCV: 95.9 fL (ref 80.0–100.0)
Monocytes Absolute: 0.6 K/uL (ref 0.1–1.0)
Monocytes Relative: 8 %
Neutro Abs: 4.9 K/uL (ref 1.7–7.7)
Neutrophils Relative %: 67 %
Platelets: 425 K/uL — ABNORMAL HIGH (ref 150–400)
RBC: 2.67 MIL/uL — ABNORMAL LOW (ref 4.22–5.81)
RDW: 16.9 % — ABNORMAL HIGH (ref 11.5–15.5)
WBC: 7.5 K/uL (ref 4.0–10.5)
nRBC: 0 % (ref 0.0–0.2)

## 2024-03-13 LAB — BASIC METABOLIC PANEL WITH GFR
Anion gap: 6 (ref 5–15)
BUN: 17 mg/dL (ref 6–20)
CO2: 31 mmol/L (ref 22–32)
Calcium: 8.8 mg/dL — ABNORMAL LOW (ref 8.9–10.3)
Chloride: 105 mmol/L (ref 98–111)
Creatinine, Ser: 0.45 mg/dL — ABNORMAL LOW (ref 0.61–1.24)
GFR, Estimated: >60 mL/min (ref >60)
Glucose, Bld: 93 mg/dL (ref 70–99)
Potassium: 3.6 mmol/L (ref 3.5–5.1)
Sodium: 142 mmol/L (ref 135–145)

## 2024-03-13 LAB — GLUCOSE, CAPILLARY
Glucose-Capillary: 117 mg/dL — ABNORMAL HIGH (ref 70–99)
Glucose-Capillary: 120 mg/dL — ABNORMAL HIGH (ref 70–99)
Glucose-Capillary: 111 mg/dL — ABNORMAL HIGH (ref 70–99)
Glucose-Capillary: 95 mg/dL (ref 70–99)
Glucose-Capillary: 102 mg/dL — ABNORMAL HIGH (ref 70–99)

## 2024-03-13 NOTE — Plan of Care (Signed)
  Problem: Clinical Measurements: Goal: Ability to maintain clinical measurements within normal limits will improve Outcome: Progressing   Problem: Nutrition: Goal: Adequate nutrition will be maintained Outcome: Progressing   Problem: Coping: Goal: Level of anxiety will decrease Outcome: Progressing   Problem: Pain Managment: Goal: General experience of comfort will improve and/or be controlled Outcome: Progressing   Problem: Respiratory: Goal: Patent airway maintenance will improve Outcome: Progressing

## 2024-03-13 NOTE — Progress Notes (Signed)
 NAME:  Garrett [name], MRN:  [id number], DOB:  [birth year], LOS: 138 ADMISSION DATE:  10/27/2023, CONSULTATION DATE:  10/27/2023 REFERRING MD: Regalado - TRH, CHIEF COMPLAINT:  Found down   History of Present Illness:  48 y/o man who was found down at home. PMHx significant for HTN.  Patient was down for an unknown amount of time, found by his fiance when she came home from work, reportedly with agonal breathing. Patient had driven her to work the morning of admission.  He has HTN but never checks his BP and does not follow with MD. Patient's fiance states that she can tell when his BP is really high; his eyes get blood shot and he is sweating. No h/o seizures. Besides almost daily marijuana and cigarette smoking, she is not aware of any other drug use. Drug screen positive for opioids and he was given several doses of Narcan . 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 BP in ED 262/156. CXR revealed RUL and perihilar infiltrates suggesting aspiration pneumonia. ED labs also revealed PH 7.169, LA 4.4, CPK 985. Patient was intubated in ED.  PCCM consulted.  Pertinent Medical History:  Hypertension  Significant Hospital Events: Including procedures, antibiotic start and stop dates in addition to other pertinent events   3/08 - Intubated in ED, pontine bleed/SAH. CT head 17:09 Acute large hemorrhage 4.1 cm in pons with intraventricular extension without hydrocephalus 3/09 - CTA 03:14 AM No change in IPH in pons, similar biparietal [facility] 3/14 - Now awake, appears locked in can follow commands with eyes. 3/16 - Purposeful with left upper extremity  3/22 - Tracheostomy placed at bedside, bleeding issues overnight from trach site 3/24 - PEG (Dr. Sebastian) 3/24 - Sputum culture with MRSA 3/27  - Vomited. TF on hold. Abd film c/w ileus. Added reglan , got SSE. Did tolerate PSV all day  3/28 - BMs x2 after SSE day prior. Added back TFs at 1/2 rated. Tolerated PS 3/29 - Tolerated PS  3/31 - Tolerating CPAP PS 15/5, TF on hold due to ileus. 4/01 - Con't to hold tube feeds today, restarting vancomycin  and sending tracheal aspirate as peaks are 37, plat 24, driving 19. Thick secretions. Fever overnight.  4/02 - Peaks improved. Ongoing hiccups. Trickle feeding. Neuro exam unchanged.  4/20 - Trach was changed to cuffless #8 4/28 - Not safe to swallow. Limited communication and he follows simple commands. On TF 5/01 - On TC 28%, with large secretions. Working with speech and he is improving to initiate PMV trials under ST supervision   5/05 - On TC 30%, 7 L/min. Trach #6 cuffless, changed 5/1 to facilitate PMV trials  5/12 - On TC 21%, 6 L/min. Trach #6 cuffless, unable to produce sounds or speak on PMV. No significant resp secretions. On PEG TF 6/10 - Tracheostomy was removed due to failure to well provide adequate airway 02/15/2024 pulmonary critical care consult. 7/07 Patient obtunded and desaturating on NRB; transfer to icu for intubation; updated mother over phone who wants to reverse code status to full code; ent consulted 7/09 Remains intubated on no continuous sedation, right eye open but does not track movement  7/16 Revision ENT trach with [facility] Flap 7/18 Weaned on vent 5 hours  7/19 Tolerated trach collar for the majority of the day but had some increased work of breath  resulting in being placed back on vent support  7/20 No issues overnight, back on ATC this AM 7/21 Did not tolerate ATC. Placed back on PRVC. 7/24 ATC day shift   Interim History / Subjective:  Back on ATC this morning, was on PRVC overnight   Objective:   Blood pressure (!) 154/107, pulse 82, temperature 97.8 F (36.6 C), temperature source Axillary, resp. rate (!) 22, height 5' 8 (1.727 m), weight 98 kg, SpO2  96%.    Vent Mode: PRVC FiO2 (%):  [40 %] 40 % Set Rate:  [20 bmp] 20 bmp Vt Set:  [540 mL] 540 mL PEEP:  [5 cmH20] 5 cmH20 Plateau Pressure:  [27 cmH20-28 cmH20] 28 cmH20   Intake/Output Summary (Last 24 hours) at 03/13/2024 0908 Last data filed at 03/13/2024 0900 Gross per 24 hour  Intake 1490 ml  Output 830 ml  Net 660 ml   Filed Weights   03/07/24 0500 03/10/24 0500 03/11/24 0500  Weight: 97.8 kg 96 kg 98 kg   Physical Examination: General: chronically ill wdwn middle aged M  HEENT: Trach secure  Neuro: Awake, following commands w vertical eye movements.  CV: rr  PULM: even unlabored on ATC  GI: soft ndnt  Extremities:dependent edema  Skin: c/d/w Resolved Problem List:  Post sedation hypotension Left eye conjunctivitis - resolved  Hyperglycemia, resolved  Assessment and Plan:    AoC resp failure in setting of intracranial process below  S/p redo tracheostomy Recent MRSA and Klebsiella PNA, recurrent aspiration PNA  -trach 3/22-6/10, reintubated 7/7, ENT trach 7/16  P -cont vent weaning efforts and ATC trials. 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 Coli UTI Discussed potential placement of suprapubic catheter with urology 7/9 recommendations made to establish discharge disposition (patient will likely need to remain inpatient upwards of 6 weeks post procedure ) and family agreeance prior to formal consult.  Called mother 7/9 mother does NOT wish to proceed at this time. P - Maintain Foley; next exchange planned for 8/12 - Off of antibiotics since 7/9 - Re-engage Urology PRN  HTN - metop, amlodipine    Inadequate PO intake Dysphagia  -EN via PEG   Elevated LFTs -PRN LFTs   Macrocytic anemia  Thrombocytosis  No report of chronic alcohol   use. B12, folate and TSH normal P -follow CBC PRN   Dispo -TOC consulted. As of 7/21 note it looks like medicaid still pending   Best Practice (right click and Reselect all SmartList Selections daily)   Diet/type: tubefeeds and NPO w/ meds via tube DVT prophylaxis: LMWH GI prophylaxis: H2B Lines: N/A and yes and it is still needed RUE DL PICC 7/7 > Foley:  Yes, and it is still needed Code Status:  full code Last date of multidisciplinary goals of care discussion 7/8, would benefit from repeat discussion  Critical care time:   N/a  High MDM    Ronnald Gave MSN, AGACNP-BC [facility] Pulmonary/Critical Care Medicine Amion for pager  03/13/2024, 9:08 AM

## 2024-03-13 NOTE — Plan of Care (Signed)
  Problem: Nutrition: Goal: Risk of aspiration will decrease Outcome: Progressing Goal: Dietary intake will improve Outcome: Progressing   Problem: Skin Integrity: Goal: Risk for impaired skin integrity will decrease Outcome: Progressing   Problem: Tissue Perfusion: Goal: Adequacy of tissue perfusion will improve Outcome: Progressing

## 2024-03-14 DIAGNOSIS — I613 Nontraumatic intracerebral hemorrhage in brain stem: Secondary | ICD-10-CM | POA: Diagnosis not present

## 2024-03-14 DIAGNOSIS — J9620 Acute and chronic respiratory failure, unspecified whether with hypoxia or hypercapnia: Secondary | ICD-10-CM | POA: Diagnosis not present

## 2024-03-14 DIAGNOSIS — I10 Essential (primary) hypertension: Secondary | ICD-10-CM | POA: Diagnosis not present

## 2024-03-14 DIAGNOSIS — Z430 Encounter for attention to tracheostomy: Secondary | ICD-10-CM | POA: Diagnosis not present

## 2024-03-14 DIAGNOSIS — G935 Compression of brain: Secondary | ICD-10-CM | POA: Diagnosis not present

## 2024-03-14 LAB — GLUCOSE, CAPILLARY
Glucose-Capillary: 111 mg/dL — ABNORMAL HIGH (ref 70–99)
Glucose-Capillary: 115 mg/dL — ABNORMAL HIGH (ref 70–99)
Glucose-Capillary: 119 mg/dL — ABNORMAL HIGH (ref 70–99)
Glucose-Capillary: 124 mg/dL — ABNORMAL HIGH (ref 70–99)
Glucose-Capillary: 129 mg/dL — ABNORMAL HIGH (ref 70–99)

## 2024-03-14 MED ORDER — CHLORHEXIDINE GLUCONATE CLOTH 2 % EX PADS
6.0000 | MEDICATED_PAD | CUTANEOUS | Status: DC
  Administered 2024-03-15 – 2024-04-23 (×41): 6 via TOPICAL

## 2024-03-14 NOTE — Plan of Care (Signed)
  Problem: Nutrition: Goal: Dietary intake will improve Outcome: Progressing   Problem: Fluid Volume: Goal: Ability to maintain a balanced intake and output will improve Outcome: Progressing   Problem: Tissue Perfusion: Goal: Adequacy of tissue perfusion will improve Outcome: Progressing   Problem: Clinical Measurements: Goal: Cardiovascular complication will be avoided Outcome: Progressing   Problem: Activity: Goal: Risk for activity intolerance will decrease Outcome: Progressing

## 2024-03-14 NOTE — Progress Notes (Addendum)
 NAME:  Garrett [name], MRN:  [id number], DOB:  09-25-1975, LOS: 139 ADMISSION DATE:  10/27/2023, CONSULTATION DATE:  10/27/2023 REFERRING MD: Regalado - TRH, CHIEF COMPLAINT:  Found down   History of Present Illness:  48 y/o man who was found down at home. PMHx significant for HTN.  Patient was down for an unknown amount of time, found by his fiance when she came home from work, reportedly with agonal breathing. Patient had driven her to work the morning of admission.  He has HTN but never checks his BP and does not follow with MD. Patient's fiance states that she can tell when his BP is really high; his eyes get blood shot and he is sweating. No h/o seizures. Besides almost daily marijuana and cigarette smoking, she is not aware of any other drug use. Drug screen positive for opioids and he was given several doses of Narcan . 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 BP in ED 262/156. CXR revealed RUL and perihilar infiltrates suggesting aspiration pneumonia. ED labs also revealed PH 7.169, LA 4.4, CPK 985. Patient was intubated in ED.  PCCM consulted.  Pertinent Medical History:  Hypertension  Significant Hospital Events: Including procedures, antibiotic start and stop dates in addition to other pertinent events   3/08 - Intubated in ED, pontine bleed/SAH. CT head 17:09 Acute large hemorrhage 4.1 cm in pons with intraventricular extension without hydrocephalus 3/09 - CTA 03:14 AM No change in IPH in pons, similar biparietal [facility] 3/14 - Now awake, appears locked in can follow commands with eyes. 3/16 - Purposeful with left upper extremity  3/22 - Tracheostomy placed at bedside, bleeding issues overnight from trach site 3/24 - PEG (Dr. Sebastian) 3/24 - Sputum culture with MRSA 3/27  - Vomited. TF on hold. Abd film c/w ileus. Added reglan , got SSE. Did tolerate PSV all day  3/28 - BMs x2 after SSE day prior. Added back TFs at 1/2 rated. Tolerated PS 3/29 - Tolerated PS  3/31 - Tolerating CPAP PS 15/5, TF on hold due to ileus. 4/01 - Con't to hold tube feeds today, restarting vancomycin  and sending tracheal aspirate as peaks are 37, plat 24, driving 19. Thick secretions. Fever overnight.  4/02 - Peaks improved. Ongoing hiccups. Trickle feeding. Neuro exam unchanged.  4/20 - Trach was changed to cuffless #8 4/28 - Not safe to swallow. Limited communication and he follows simple commands. On TF 5/01 - On TC 28%, with large secretions. Working with speech and he is improving to initiate PMV trials under ST supervision   5/05 - On TC 30%, 7 L/min. Trach #6 cuffless, changed 5/1 to facilitate PMV trials  5/12 - On TC 21%, 6 L/min. Trach #6 cuffless, unable to produce sounds or speak on PMV. No significant resp secretions. On PEG TF 6/10 - Tracheostomy was removed due to failure to well provide adequate airway 02/15/2024 pulmonary critical care consult. 7/07 Patient obtunded and desaturating on NRB; transfer to icu for intubation; updated mother over phone who wants to reverse code status to full code; ent consulted 7/09 Remains intubated on no continuous sedation, right eye open but does not track movement  7/16 Revision ENT trach with [facility] Flap 7/18 Weaned on vent 5 hours  7/19 Tolerated trach collar for the majority of the day but had some increased work of breath  resulting in being placed back on vent support  7/20 No issues overnight, back on ATC this AM 7/21 Did not tolerate ATC. Placed back on PRVC. 7/24 ATC until afternoon and went on vent for WOB reportedly   Interim History / Subjective:   PRVC overnight   Objective:   Blood pressure (!) 131/99, pulse 74, temperature 98.8 F (37.1 C), temperature source Axillary, resp. rate 20, height 5' 8 (1.727 m), weight 87  kg, SpO2 100%.    Vent Mode: PRVC FiO2 (%):  [40 %] 40 % Set Rate:  [20 bmp] 20 bmp Vt Set:  [540 mL] 540 mL PEEP:  [5 cmH20] 5 cmH20 Plateau Pressure:  [21 cmH20-25 cmH20] 21 cmH20   Intake/Output Summary (Last 24 hours) at 03/14/2024 9171 Last data filed at 03/14/2024 0600 Gross per 24 hour  Intake 1234.75 ml  Output 1330 ml  Net -95.25 ml   Filed Weights   03/10/24 0500 03/11/24 0500 03/14/24 0432  Weight: 96 kg 98 kg 87 kg   Physical Examination: General: chronically ill middle aged M  HEENT: NCAT. Trach secure  Neuro: Awake not following commands  CV: rrr   PULM: Symmetrical chest expansion, mechanically ventilated  GI: soft ndnt  Extremities:no acute joint deformity.  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  Called mother 7/9 mother does NOT wish to proceed at this time. -off abx since 7/8  P -maintain foley, exchange planned for 8/12 - Re-engage Urology PRN  HTN - metop, amlodipine    Inadequate PO intake Dysphagia  -EN via PEG   Elevated LFTs -PRN LFTs   Macrocytic anemia  Thrombocytosis  No report of chronic alcohol   use. B12, folate and TSH normal P -follow CBC PRN   Dispo -insurance has been a barrier, TOC is following and seems we are still awaiting medicaid.  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

## 2024-03-14 NOTE — TOC Progression Note (Signed)
 Transition of Care [facility]) - Progression Note    Patient Details  Name: Garrett [name] MRN: [id number] Date of Birth: Dec 02, 1975  Transition of [facility]) CM/SW Contact  [name], [name] Phone Number: 03/14/2024, 9:35 AM  Clinical Narrative:      [name] with [facility] [phone], [phone] ) for update on disability. No answer; left voicemail requesting return call.    Expected Discharge Plan: Long Term Nursing Home Barriers to Discharge: Continued Medical Work up, SNF Pending Medicaid, SNF Pending bed offer, SNF Pending payor source - LOG, Inadequate or no insurance (New trach)               Expected Discharge Plan and Services In-house Referral: Clinical Social Work, Hospice / Palliative Care   Post Acute Care Choice: Skilled Nursing Facility Living arrangements for the past 2 months: Single Family Home                                       Social Drivers of Health (SDOH) Interventions SDOH Screenings   Food Insecurity: Patient Unable To Answer (10/29/2023)  Social Connections: Unknown (06/23/2023)   Received from [facility]  Tobacco Use: High Risk (10/31/2023)    Readmission Risk Interventions     No data to display

## 2024-03-15 DIAGNOSIS — I613 Nontraumatic intracerebral hemorrhage in brain stem: Secondary | ICD-10-CM

## 2024-03-15 DIAGNOSIS — Z930 Tracheostomy status: Secondary | ICD-10-CM | POA: Diagnosis not present

## 2024-03-15 DIAGNOSIS — J9620 Acute and chronic respiratory failure, unspecified whether with hypoxia or hypercapnia: Secondary | ICD-10-CM | POA: Diagnosis not present

## 2024-03-15 LAB — GLUCOSE, CAPILLARY
Glucose-Capillary: 109 mg/dL — ABNORMAL HIGH (ref 70–99)
Glucose-Capillary: 119 mg/dL — ABNORMAL HIGH (ref 70–99)
Glucose-Capillary: 101 mg/dL — ABNORMAL HIGH (ref 70–99)

## 2024-03-15 NOTE — Progress Notes (Signed)
 NAME:  Garrett [name], MRN:  [id number], DOB:  16-Jan-1976, LOS: 140 ADMISSION DATE:  10/27/2023, CONSULTATION DATE:  10/27/2023 REFERRING MD: Madelyne - TRH, CHIEF COMPLAINT:  Found down   History of Present Illness:  48 y/o man who was found down at home. PMHx significant for HTN.  Patient was down for an unknown amount of time, found by his fiance when she came home from work, reportedly with agonal breathing. Patient had driven her to work the morning of admission.  He has HTN but never checks his BP and does not follow with MD. Patient's fiance states that she can tell when his BP is really high; his eyes get blood shot and he is sweating. No h/o seizures. Besides almost daily marijuana and cigarette smoking, she is not aware of any other drug use. Drug screen positive for opioids and he was given several doses of Narcan . 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 BP in ED 262/156. CXR revealed RUL and perihilar infiltrates suggesting aspiration pneumonia. ED labs also revealed PH 7.169, LA 4.4, CPK 985. Patient was intubated in ED.  PCCM consulted.  Pertinent Medical History:  Hypertension  Significant Hospital Events: Including procedures, antibiotic start and stop dates in addition to other pertinent events   3/08 - Intubated in ED, pontine bleed/SAH. CT head 17:09 Acute large hemorrhage 4.1 cm in pons with intraventricular extension without hydrocephalus 3/09 - CTA 03:14 AM No change in IPH in pons, similar biparietal [facility] 3/14 - Now awake, appears locked in can follow commands with eyes. 3/16 - Purposeful with left upper extremity  3/22 - Tracheostomy placed at bedside, bleeding issues overnight from trach site 3/24 - PEG (Dr. Sebastian) 3/24 - Sputum culture with MRSA 3/27  - Vomited. TF on hold. Abd film c/w ileus. Added reglan , got SSE. Did tolerate PSV all day  3/28 - BMs x2 after SSE day prior. Added back TFs at 1/2 rated. Tolerated PS 3/29 - Tolerated PS  3/31 - Tolerating CPAP PS 15/5, TF on hold due to ileus. 4/01 - Con't to hold tube feeds today, restarting vancomycin  and sending tracheal aspirate as peaks are 37, plat 24, driving 19. Thick secretions. Fever overnight.  4/02 - Peaks improved. Ongoing hiccups. Trickle feeding. Neuro exam unchanged.  4/20 - Trach was changed to cuffless #8 4/28 - Not safe to swallow. Limited communication and he follows simple commands. On TF 5/01 - On TC 28%, with large secretions. Working with speech and he is improving to initiate PMV trials under ST supervision   5/05 - On TC 30%, 7 L/min. Trach #6 cuffless, changed 5/1 to facilitate PMV trials  5/12 - On TC 21%, 6 L/min. Trach #6 cuffless, unable to produce sounds or speak on PMV. No significant resp secretions. On PEG TF 6/10 - Tracheostomy was removed due to failure to well provide adequate airway 02/15/2024 pulmonary critical care consult. 7/07 Patient obtunded and desaturating on NRB; transfer to icu for intubation; updated mother over phone who wants to reverse code status to full code; ent consulted 7/09 Remains intubated on no continuous sedation, right eye open but does not track movement  7/16 Revision ENT trach with [facility] Flap 7/18 Weaned on vent 5 hours  7/19 Tolerated trach collar for the majority of the day but had some increased work of breath  resulting in being placed back on vent support  7/20 No issues overnight, back on ATC this AM 7/21 Did not tolerate ATC. Placed back on PRVC. 7/24 ATC until afternoon and went on vent for WOB reportedly  7/25 ATC for a few hours   Interim History / Subjective:   ATC this morning   Objective:   Blood pressure (!) 151/97, pulse 90, temperature 98.4 F (36.9 C), temperature source Oral, resp. rate (!) 22, height  5' 8 (1.727 m), weight 87.2 kg, SpO2 97%.    Vent Mode: PRVC FiO2 (%):  [40 %] 40 % Set Rate:  [20 bmp] 20 bmp Vt Set:  [540 mL] 540 mL PEEP:  [5 cmH20] 5 cmH20 Plateau Pressure:  [19 cmH20-22 cmH20] 19 cmH20   Intake/Output Summary (Last 24 hours) at 03/15/2024 1017 Last data filed at 03/15/2024 0600 Gross per 24 hour  Intake 1570.25 ml  Output 1190 ml  Net 380.25 ml   Filed Weights   03/11/24 0500 03/14/24 0432 03/15/24 0500  Weight: 98 kg 87 kg 87.2 kg   Physical Examination:  General: chronically ill middle aged M  HEENT: NCAT pink mm Trach secure  Neuro: Awake, following vertical eye commands  CV: regular rate  PULM: upper rhonchi  GI: soft non distended  Extremities: no acute joint deformity  Skin: cdw   Resolved Problem List:  Post sedation hypotension Left eye conjunctivitis - resolved  Hyperglycemia, resolved  Assessment and Plan:   AoC respiratory failure in setting of brain bleed below S/p redo trach  -hx MRSA PNA klebsiella PNA, recurrent aspiration PNA   -trach 3/22-6/10, reintubated 7/7, ENT trach 7/16  P -cont vent weaning efforts w goal open ended trach collar-- tolerating ATC for a few hours at a time the last few days   -Pulm hygiene -- [facility] CPT  -ENT following trach   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  Called mother 7/9 mother does NOT wish to proceed at this time. -off abx since 7/8  P -maintain foley, exchange planned for 8/12 - Re-engage Urology PRN  HTN - metop, amlodipine    Inadequate PO intake Dysphagia  -EN via PEG    Elevated LFTs -PRN LFTs   Macrocytic anemia  Thrombocytosis  No report of chronic alcohol  use. B12, folate and TSH normal P -follow CBC PRN   Dispo -insurance has been a barrier, TOC is following and seems we are still awaiting medicaid.  -medically we are working on vent Engineer, technical sales (right click and Control and instrumentation engineer daily)   Diet/type: tubefeeds and NPO w/ meds via tube DVT prophylaxis: LMWH GI prophylaxis: H2B Lines: N/A and yes and it is still needed RUE DL PICC 7/7 > Foley:  Yes, and it is still needed Code Status:  full code Last date of multidisciplinary goals of care discussion 7/8, would benefit from repeat discussion  Critical care time:   N/a   Ronnald Gave MSN, AGACNP-BC [facility] Pulmonary/Critical Care Medicine Amion for pager  03/15/2024, 10:17 AM

## 2024-03-16 DIAGNOSIS — I613 Nontraumatic intracerebral hemorrhage in brain stem: Secondary | ICD-10-CM | POA: Diagnosis not present

## 2024-03-16 DIAGNOSIS — J9620 Acute and chronic respiratory failure, unspecified whether with hypoxia or hypercapnia: Secondary | ICD-10-CM | POA: Diagnosis not present

## 2024-03-16 DIAGNOSIS — Z930 Tracheostomy status: Secondary | ICD-10-CM | POA: Diagnosis not present

## 2024-03-16 LAB — GLUCOSE, CAPILLARY
Glucose-Capillary: 105 mg/dL — ABNORMAL HIGH (ref 70–99)
Glucose-Capillary: 123 mg/dL — ABNORMAL HIGH (ref 70–99)
Glucose-Capillary: 137 mg/dL — ABNORMAL HIGH (ref 70–99)
Glucose-Capillary: 148 mg/dL — ABNORMAL HIGH (ref 70–99)

## 2024-03-16 NOTE — Progress Notes (Addendum)
 NAME:  Garrett [name], MRN:  [id number], DOB:  [birth year], LOS: 141 ADMISSION DATE:  10/27/2023, CONSULTATION DATE:  10/27/2023 REFERRING MD: Regalado - TRH, CHIEF COMPLAINT:  Found down   History of Present Illness:  48 y/o man who was found down at home. PMHx significant for HTN.  Patient was down for an unknown amount of time, found by his fiance when she came home from work, reportedly with agonal breathing. Patient had driven her to work the morning of admission.  He has HTN but never checks his BP and does not follow with MD. Patient's fiance states that she can tell when his BP is really high; his eyes get blood shot and he is sweating. No h/o seizures. Besides almost daily marijuana and cigarette smoking, she is not aware of any other drug use. Drug screen positive for opioids and he was given several doses of Narcan . 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 BP in ED 262/156. CXR revealed RUL and perihilar infiltrates suggesting aspiration pneumonia. ED labs also revealed PH 7.169, LA 4.4, CPK 985. Patient was intubated in ED.  PCCM consulted.  Pertinent Medical History:  Hypertension  Significant Hospital Events: Including procedures, antibiotic start and stop dates in addition to other pertinent events   3/08 - Intubated in ED, pontine bleed/SAH. CT head 17:09 Acute large hemorrhage 4.1 cm in pons with intraventricular extension without hydrocephalus 3/09 - CTA 03:14 AM No change in IPH in pons, similar biparietal [facility] 3/14 - Now awake, appears locked in can follow commands with eyes. 3/16 - Purposeful with left upper extremity  3/22 - Tracheostomy placed at bedside, bleeding issues overnight from trach site 3/24 - PEG (Dr. Sebastian) 3/24 - Sputum culture with MRSA 3/27  - Vomited. TF on hold. Abd film c/w ileus. Added reglan , got SSE. Did tolerate PSV all day  3/28 - BMs x2 after SSE day prior. Added back TFs at 1/2 rated. Tolerated PS 3/29 - Tolerated PS  3/31 - Tolerating CPAP PS 15/5, TF on hold due to ileus. 4/01 - Con't to hold tube feeds today, restarting vancomycin  and sending tracheal aspirate as peaks are 37, plat 24, driving 19. Thick secretions. Fever overnight.  4/02 - Peaks improved. Ongoing hiccups. Trickle feeding. Neuro exam unchanged.  4/20 - Trach was changed to cuffless #8 4/28 - Not safe to swallow. Limited communication and he follows simple commands. On TF 5/01 - On TC 28%, with large secretions. Working with speech and he is improving to initiate PMV trials under ST supervision   5/05 - On TC 30%, 7 L/min. Trach #6 cuffless, changed 5/1 to facilitate PMV trials  5/12 - On TC 21%, 6 L/min. Trach #6 cuffless, unable to produce sounds or speak on PMV. No significant resp secretions. On PEG TF 6/10 - Tracheostomy was removed due to failure to well provide adequate airway 02/15/2024 pulmonary critical care consult. 7/07 Patient obtunded and desaturating on NRB; transfer to icu for intubation; updated mother over phone who wants to reverse code status to full code; ent consulted 7/09 Remains intubated on no continuous sedation, right eye open but does not track movement  7/16 Revision ENT trach with [name] Flap 7/18 Weaned on vent 5 hours  7/19 Tolerated trach collar for the majority of the day but had some increased work of breath  resulting in being placed back on vent support  7/20 No issues overnight, back on ATC this AM 7/21 Did not tolerate ATC. Placed back on PRVC. 7/24 ATC until afternoon and went on vent for WOB reportedly  7/25 ATC for a few hours  7/26 ATC x 6h  Interim History / Subjective:   Tolerated ATC until 4 PM yesterday  Febrile Secretions continue Good urine output  Objective:   Blood pressure (!) 135/94, pulse 69,  temperature 98.5 F (36.9 C), temperature source Oral, resp. rate (!) 25, height 5' 8 (1.727 m), weight 92 kg, SpO2 100%.    Vent Mode: PRVC FiO2 (%):  [40 %] 40 % Set Rate:  [20 bmp] 20 bmp Vt Set:  [540 mL] 540 mL PEEP:  [5 cmH20] 5 cmH20 Plateau Pressure:  [13 cmH20-17 cmH20] 13 cmH20   Intake/Output Summary (Last 24 hours) at 03/16/2024 1039 Last data filed at 03/16/2024 0600 Gross per 24 hour  Intake 1280.17 ml  Output 2110 ml  Net -829.83 ml   Filed Weights   03/14/24 0432 03/15/24 0500 03/16/24 0359  Weight: 87 kg 87.2 kg 92 kg   Physical Examination:  General: chronically ill middle aged M  HEENT: NCAT pink mm Trach secure  Neuro: Awake, locked-in, he has vertical eye movements CV: regular rate  PULM: Bilateral scattered crackles expected secretions GI: soft non distended  Extremities: no acute joint deformity  Skin: cdw   Labs 7/23 showed normal electrolytes no leukocytosis  Resolved Problem List:  Post sedation hypotension Left eye conjunctivitis - resolved  Hyperglycemia, resolved  Assessment and Plan:   AoC respiratory failure in setting of brain bleed below S/p redo trach  -hx MRSA PNA klebsiella PNA, recurrent aspiration PNA   -trach 3/22-6/10, reintubated 7/7, ENT trach 7/16  P -cont vent weaning efforts w goal open ended trach collar-- tolerating ATC for a few hours at a time the last few days , would like to cycle 4 to 6 hours -Tracheobronchial toilet is key with frequent suctioning , clearing secretions -ENT following trach  - Considered colonized at this point ,repeat respiratory culture if secretions increase or leukocytosis  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  Called mother 7/9 mother does NOT wish to proceed at this time. -off abx since 7/8  P -maintain foley, exchange planned for 8/12 - Re-engage Urology PRN  HTN - metop, amlodipine    Inadequate PO intake Dysphagia  -EN via PEG   Elevated LFTs -PRN LFTs   Macrocytic anemia  Thrombocytosis  No report of chronic alcohol  use. B12, folate and TSH normal P -follow CBC PRN   Dispo -insurance has been a barrier, TOC is following and seems we are still awaiting medicaid.  Hope that we can liberate him from the vent which would make this probably easier  Best Practice (right click and Reselect all SmartList Selections daily)   Diet/type: tubefeeds and NPO w/ meds via tube DVT prophylaxis: LMWH GI prophylaxis: H2B Lines: N/A and yes and it is still needed RUE DL PICC 7/7 > Foley:  Yes, and it is still needed Code Status:  full code Last date of multidisciplinary  goals of care discussion 7/8     Elic Vencill V. Jude MD [facility] Pulmonary/Critical Care Medicine Amion for pager  03/16/2024, 10:39 AM

## 2024-03-17 DIAGNOSIS — I10 Essential (primary) hypertension: Secondary | ICD-10-CM | POA: Diagnosis not present

## 2024-03-17 DIAGNOSIS — J9620 Acute and chronic respiratory failure, unspecified whether with hypoxia or hypercapnia: Secondary | ICD-10-CM | POA: Diagnosis not present

## 2024-03-17 DIAGNOSIS — I613 Nontraumatic intracerebral hemorrhage in brain stem: Secondary | ICD-10-CM | POA: Diagnosis not present

## 2024-03-17 LAB — CBC
HCT: 26.1 % — ABNORMAL LOW (ref 39.0–52.0)
Hemoglobin: 7.7 g/dL — ABNORMAL LOW (ref 13.0–17.0)
MCH: 27.3 pg (ref 26.0–34.0)
MCHC: 29.5 g/dL — ABNORMAL LOW (ref 30.0–36.0)
MCV: 92.6 fL (ref 80.0–100.0)
Platelets: 376 K/uL (ref 150–400)
RBC: 2.82 MIL/uL — ABNORMAL LOW (ref 4.22–5.81)
RDW: 16.2 % — ABNORMAL HIGH (ref 11.5–15.5)
WBC: 8 K/uL (ref 4.0–10.5)
nRBC: 0 % (ref 0.0–0.2)

## 2024-03-17 LAB — COMPREHENSIVE METABOLIC PANEL WITH GFR
ALT: 141 U/L — ABNORMAL HIGH (ref 0–44)
AST: 56 U/L — ABNORMAL HIGH (ref 15–41)
Albumin: 2.4 g/dL — ABNORMAL LOW (ref 3.5–5.0)
Alkaline Phosphatase: 62 U/L (ref 38–126)
Anion gap: 7 (ref 5–15)
BUN: 18 mg/dL (ref 6–20)
CO2: 28 mmol/L (ref 22–32)
Calcium: 8.6 mg/dL — ABNORMAL LOW (ref 8.9–10.3)
Chloride: 103 mmol/L (ref 98–111)
Creatinine, Ser: 0.45 mg/dL — ABNORMAL LOW (ref 0.61–1.24)
GFR, Estimated: >60 mL/min (ref >60)
Glucose, Bld: 108 mg/dL — ABNORMAL HIGH (ref 70–99)
Potassium: 3.6 mmol/L (ref 3.5–5.1)
Sodium: 138 mmol/L (ref 135–145)
Total Bilirubin: 0.4 mg/dL (ref 0.0–1.2)
Total Protein: 6.3 g/dL — ABNORMAL LOW (ref 6.5–8.1)

## 2024-03-17 LAB — PHOSPHORUS: Phosphorus: 3.7 mg/dL (ref 2.5–4.6)

## 2024-03-17 LAB — GLUCOSE, CAPILLARY
Glucose-Capillary: 106 mg/dL — ABNORMAL HIGH (ref 70–99)
Glucose-Capillary: 111 mg/dL — ABNORMAL HIGH (ref 70–99)
Glucose-Capillary: 112 mg/dL — ABNORMAL HIGH (ref 70–99)
Glucose-Capillary: 116 mg/dL — ABNORMAL HIGH (ref 70–99)
Glucose-Capillary: 120 mg/dL — ABNORMAL HIGH (ref 70–99)

## 2024-03-17 LAB — MAGNESIUM: Magnesium: 2 mg/dL (ref 1.7–2.4)

## 2024-03-17 NOTE — Progress Notes (Signed)
 Physical Therapy Treatment Patient Details Name: Garrett [name] MRN: [id number] DOB: 06-13-1976 Today's Date: 03/17/2024   History of Present Illness Pt is a 48 yo male who was found down 10/27/23 with agonal breathing. 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 Intubated 3/8, cortrak placed 3/14, trach placed 3/22, PEG placed 3/24. Reintubated 02/25/2024 and trach 7/16.  PMH: HTN, smoker    PT Comments  Patient with limited progress toward PT goals though tolerating trach collar at 30% FiO2 all day today and with EOB activity noted fatigue only due to RR up to 37 and improved back to 20's when in supine.  Patient with eyes open about 20% of session though with L eye erythema and draining (RN aware).  Patient will continue to benefit from skilled PT in the acute setting.  Will need post-acute rehab in appropriate setting as per TOC.   If plan is discharge home, recommend the following: Two people to help with walking and/or transfers;Two people to help with bathing/dressing/bathroom;Assistance with cooking/housework;Assistance with feeding;Direct supervision/assist for medications management;Direct supervision/assist for financial management;Assist for transportation;Help with stairs or ramp for entrance;Supervision due to cognitive status   Can travel by private vehicle        Equipment Recommendations  [place] lift;Hospital bed;Wheelchair (measurements PT);Wheelchair cushion (measurements PT);BSC/3in1;Other (comment)    Recommendations for Other Services       Precautions / Restrictions Precautions Precautions: Fall Recall of Precautions/Restrictions: Impaired Precaution/Restrictions Comments: PEG, trach SBP < 160 Splint/Cast: Bil resting hand splints     Mobility  Bed Mobility Overal bed mobility: Needs Assistance Bed  Mobility: Supine to Sit, Sit to Supine, Rolling Rolling: +2 for safety/equipment, Total assist, +2 for physical assistance   Supine to sit: Total assist, +2 for physical assistance Sit to supine: Total assist, +2 for physical assistance   General bed mobility comments: up to EOB assist with all aspects; assist to supine and for positioning    Transfers                        Ambulation/Gait                   Stairs             Wheelchair Mobility     Tilt Bed    Modified Rankin (Stroke Patients Only) Modified Rankin (Stroke Patients Only) Pre-Morbid Rankin Score: No symptoms Modified Rankin: Severe disability     Balance Overall balance assessment: Needs assistance   Sitting balance-Leahy Scale: Zero Sitting balance - Comments: leaning back on PT for posterior support                                    Communication Communication Factors Affecting Communication: Trach/intubated  Cognition Arousal: Alert Behavior During Therapy: Flat affect   PT - Cognitive impairments: Difficult to assess Difficult to assess due to: Impaired communication                     PT - Cognition Comments: eyes open intermittently in session, fixates more with R eye with more errythema and draining L; focused attention as eyes close quickly or deviate down when not stimulated Following commands: Impaired Following commands impaired: Follows one step commands inconsistently  Cueing    Exercises General Exercises - Lower Extremity Ankle Circles/Pumps: PROM, 5 reps, Supine, Both Heel Slides: PROM, Right, 5 reps, Supine Other Exercises Other Exercises: Seated lateral weight shifts, cervical lateral flexion, rotation, extension and trunk extension PROM Other Exercises: PROM L wrist and fingers with cues for grasping object then to wave though no active movement noted    General Comments General comments (skin integrity, edema, etc.):  trach collar entirety of session 6L 30% FiO2 with RR max 37, SpO2 93-96% HR 90's-100's; BP stable 140's/100's      Pertinent Vitals/Pain Pain Assessment Pain Assessment: Faces Pain Score: 0-No pain    Home Living                          Prior Function            PT Goals (current goals can now be found in the care plan section) Progress towards PT goals: Progressing toward goals    Frequency    Min 1X/week      PT Plan      Co-evaluation              AM-PAC PT 6 Clicks Mobility   Outcome Measure  Help needed turning from your back to your side while in a flat bed without using bedrails?: Total Help needed moving from lying on your back to sitting on the side of a flat bed without using bedrails?: Total Help needed moving to and from a bed to a chair (including a wheelchair)?: Total Help needed standing up from a chair using your arms (e.g., wheelchair or bedside chair)?: Total Help needed to walk in hospital room?: Total Help needed climbing 3-5 steps with a railing? : Total 6 Click Score: 6    End of Session Equipment Utilized During Treatment: Oxygen Activity Tolerance: Patient tolerated treatment well Patient left: in bed Nurse Communication: Mobility status PT Visit Diagnosis: Muscle weakness (generalized) (M62.81);Other symptoms and signs involving the nervous system (R29.898);Hemiplegia and hemiparesis Hemiplegia - Right/Left: Right Hemiplegia - dominant/non-dominant: Dominant Hemiplegia - caused by: Nontraumatic intracerebral hemorrhage     Time: 1510-1546 PT Time Calculation (min) (ACUTE ONLY): 36 min  Charges:    $Therapeutic Activity: 23-37 mins PT General Charges $$ ACUTE PT VISIT: 1 Visit                     Micheline Portal, PT Acute Rehabilitation Services Office:[phone] 03/17/2024    [name] 03/17/2024, 5:36 PM

## 2024-03-17 NOTE — Progress Notes (Signed)
 NAME:  Garrett [name], MRN:  [id number], DOB:  16-Oct-1975, LOS: 142 ADMISSION DATE:  10/27/2023, CONSULTATION DATE:  10/27/2023 REFERRING MD: Regalado - TRH, CHIEF COMPLAINT:  Found down   History of Present Illness:  48 y/o man who was found down at home. PMHx significant for HTN.  Patient was down for an unknown amount of time, found by his fiance when she came home from work, reportedly with agonal breathing. Patient had driven her to work the morning of admission.  He has HTN but never checks his BP and does not follow with MD. Patient's fiance states that she can tell when his BP is really high; his eyes get blood shot and he is sweating. No h/o seizures. Besides almost daily marijuana and cigarette smoking, she is not aware of any other drug use. Drug screen positive for opioids and he was given several doses of Narcan . 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 BP in ED 262/156. CXR revealed RUL and perihilar infiltrates suggesting aspiration pneumonia. ED labs also revealed PH 7.169, LA 4.4, CPK 985. Patient was intubated in ED.  PCCM consulted.  Pertinent Medical History:  Hypertension  Significant Hospital Events: Including procedures, antibiotic start and stop dates in addition to other pertinent events   3/08 - Intubated in ED, pontine bleed/SAH. CT head 17:09 Acute large hemorrhage 4.1 cm in pons with intraventricular extension without hydrocephalus 3/09 - CTA 03:14 AM No change in IPH in pons, similar biparietal [facility] 3/14 - Now awake, appears locked in can follow commands with eyes. 3/16 - Purposeful with left upper extremity  3/22 - Tracheostomy placed at bedside, bleeding issues overnight from trach site 3/24 - PEG (Dr. Sebastian) 3/24 - Sputum culture with MRSA 3/27  - Vomited. TF on hold. Abd film c/w ileus. Added reglan , got SSE. Did tolerate PSV all day  3/28 - BMs x2 after SSE day prior. Added back TFs at 1/2 rated. Tolerated PS 3/29 - Tolerated PS  3/31 - Tolerating CPAP PS 15/5, TF on hold due to ileus. 4/01 - Con't to hold tube feeds today, restarting vancomycin  and sending tracheal aspirate as peaks are 37, plat 24, driving 19. Thick secretions. Fever overnight.  4/02 - Peaks improved. Ongoing hiccups. Trickle feeding. Neuro exam unchanged.  4/20 - Trach was changed to cuffless #8 4/28 - Not safe to swallow. Limited communication and he follows simple commands. On TF 5/01 - On TC 28%, with large secretions. Working with speech and he is improving to initiate PMV trials under ST supervision   5/05 - On TC 30%, 7 L/min. Trach #6 cuffless, changed 5/1 to facilitate PMV trials  5/12 - On TC 21%, 6 L/min. Trach #6 cuffless, unable to produce sounds or speak on PMV. No significant resp secretions. On PEG TF 6/10 - Tracheostomy was removed due to failure to well provide adequate airway 02/15/2024 pulmonary critical care consult. 7/07 Patient obtunded and desaturating on NRB; transfer to icu for intubation; updated mother over phone who wants to reverse code status to full code; ent consulted 7/09 Remains intubated on no continuous sedation, right eye open but does not track movement  7/16 Revision ENT trach with [facility] Flap 7/18 Weaned on vent 5 hours  7/19 Tolerated trach collar for the majority of the day but had some increased work of breath  resulting in being placed back on vent support  7/20 No issues overnight, back on ATC this AM 7/21 Did not tolerate ATC. Placed back on PRVC. 7/24 ATC until afternoon and went on vent for WOB reportedly  7/25 ATC for a few hours  7/26 ATC x 6h 7/28 tolerating trach collar  Interim History / Subjective:   Tolerated trach collar, on vent overnight Still with secretions Objective:   Blood pressure 136/89, pulse  69, temperature (!) 97.5 F (36.4 C), temperature source Oral, resp. rate (!) 24, height 5' 8 (1.727 m), weight 93 kg, SpO2 100%.    Vent Mode: PRVC FiO2 (%):  [40 %] 40 % Set Rate:  [20 bmp] 20 bmp Vt Set:  [540 mL] 540 mL PEEP:  [5 cmH20] 5 cmH20 Pressure Support:  [5 cmH20] 5 cmH20 Plateau Pressure:  [17 cmH20-19 cmH20] 19 cmH20   Intake/Output Summary (Last 24 hours) at 03/17/2024 0854 Last data filed at 03/17/2024 0800 Gross per 24 hour  Intake 1384.83 ml  Output 955 ml  Net 429.83 ml   Filed Weights   03/15/24 0500 03/16/24 0359 03/17/24 0336  Weight: 87.2 kg 92 kg 93 kg   Physical Examination:  General: Chronically ill-appearing HEENT: Moist oral mucosa Neuro: Awake, locked-in CV: S1-S2 appreciated PULM: Bilateral rales GI: soft non distended  Extremities: No clubbing, no edema Skin: cdw   I reviewed last 24 h vitals and pain scores, last 48 h intake and output, last 24 h labs and trends, and last 24 h imaging results. 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 coli UTI - Discussion had about suprapubic catheter with urology 7/9 - Family not in agreeance at present - Maintain Foley - Foley to be changed 8/12  Hypertension - Continue metoprolol , amlodipine   Nutrition - Continue via PEG  Anemia Thrombocytosis - Follow  Weaning off vent to trach collar as tolerated  Dispo -insurance has been a barrier, TOC is following and seems we are still awaiting  medicaid.  Hope that we can liberate him from the vent which would make this probably easier  Best Practice (right click and Reselect all SmartList Selections daily)   Diet/type: tubefeeds and NPO w/ meds via tube DVT prophylaxis: LMWH GI prophylaxis: H2B Lines: N/A and yes and it is still needed RUE DL PICC 7/7 > Foley:  Yes, and it is still needed Code Status:  full code Last date of multidisciplinary goals of care discussion 7/8     Jennet DELENA Epley MD [facility] Pulmonary/Critical Care Medicine Amion for pager  03/17/2024, 8:54 AM

## 2024-03-17 NOTE — TOC Progression Note (Signed)
 Transition of Care [facility]) - Progression Note    Patient Details  Name: Croy Drumwright MRN: [id number] Date of Birth: 07/07/1976  Transition of Care [facility]) CM/SW Contact  [name], [name] Phone Number: 03/17/2024, 10:54 AM  Clinical Narrative:     CSW received call from  [facility] with [facility] [phone], [phone] ). Informed that pt's mother was completing needed paperwork for disability to be filed. Per Cherlyn, Pt's mother reported that she sent needed paperwork in mail to [employer]. Lorie states she will file for disability with social security once she received the paperwork in the mail.   Expected Discharge Plan: Long Term Nursing Home Barriers to Discharge: Continued Medical Work up, SNF Pending Medicaid, SNF Pending bed offer, SNF Pending payor source - LOG, Inadequate or no insurance (New trach)               Expected Discharge Plan and Services In-house Referral: Clinical Social Work, Hospice / Palliative Care   Post Acute Care Choice: Skilled Nursing Facility Living arrangements for the past 2 months: Single Family Home                                       Social Drivers of Health (SDOH) Interventions SDOH Screenings   Food Insecurity: Patient Unable To Answer (10/29/2023)  Social Connections: Unknown (06/23/2023)   Received from [facility]  Tobacco Use: High Risk (10/31/2023)    Readmission Risk Interventions     No data to display

## 2024-03-18 DIAGNOSIS — I10 Essential (primary) hypertension: Secondary | ICD-10-CM | POA: Diagnosis not present

## 2024-03-18 DIAGNOSIS — I613 Nontraumatic intracerebral hemorrhage in brain stem: Secondary | ICD-10-CM | POA: Diagnosis not present

## 2024-03-18 DIAGNOSIS — J9620 Acute and chronic respiratory failure, unspecified whether with hypoxia or hypercapnia: Secondary | ICD-10-CM | POA: Diagnosis not present

## 2024-03-18 LAB — GLUCOSE, CAPILLARY
Glucose-Capillary: 125 mg/dL — ABNORMAL HIGH (ref 70–99)
Glucose-Capillary: 162 mg/dL — ABNORMAL HIGH (ref 70–99)
Glucose-Capillary: 78 mg/dL (ref 70–99)

## 2024-03-18 MED ORDER — PROSOURCE TF20 ENFIT COMPATIBL EN LIQD
60.0000 mL | Freq: Three times a day (TID) | ENTERAL | Status: DC
  Administered 2024-03-18 – 2024-04-08 (×74): 60 mL
  Filled 2024-03-18 (×61): qty 60

## 2024-03-18 MED ORDER — FREE WATER
200.0000 mL | Freq: Two times a day (BID) | Status: DC
  Administered 2024-03-18 – 2024-04-08 (×49): 200 mL

## 2024-03-18 MED ORDER — JEVITY 1.5 CAL/FIBER PO LIQD
237.0000 mL | Freq: Every day | ORAL | Status: DC
  Administered 2024-03-18 – 2024-04-08 (×123): 237 mL
  Filled 2024-03-18 (×117): qty 237

## 2024-03-18 NOTE — Progress Notes (Signed)
 NAME:  Garrett [name], MRN:  [id number], DOB:  Aug 14, 1976, LOS: 143 ADMISSION DATE:  10/27/2023, CONSULTATION DATE:  10/27/2023 REFERRING MD: Regalado - TRH, CHIEF COMPLAINT:  Found down   History of Present Illness:  48 y/o man who was found down at home. PMHx significant for HTN.  Patient was down for an unknown amount of time, found by his fiance when she came home from work, reportedly with agonal breathing. Patient had driven her to work the morning of admission.  He has HTN but never checks his BP and does not follow with MD. Patient's fiance states that she can tell when his BP is really high; his eyes get blood shot and he is sweating. No h/o seizures. Besides almost daily marijuana and cigarette smoking, she is not aware of any other drug use. Drug screen positive for opioids and he was given several doses of Narcan . 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 BP in ED 262/156. CXR revealed RUL and perihilar infiltrates suggesting aspiration pneumonia. ED labs also revealed PH 7.169, LA 4.4, CPK 985. Patient was intubated in ED.  PCCM consulted.  Pertinent Medical History:  Hypertension  Significant Hospital Events: Including procedures, antibiotic start and stop dates in addition to other pertinent events   3/08 - Intubated in ED, pontine bleed/SAH. CT head 17:09 Acute large hemorrhage 4.1 cm in pons with intraventricular extension without hydrocephalus 3/09 - CTA 03:14 AM No change in IPH in pons, similar biparietal [facility] 3/14 - Now awake, appears locked in can follow commands with eyes. 3/16 - Purposeful with left upper extremity  3/22 - Tracheostomy placed at bedside, bleeding issues overnight from trach site 3/24 - PEG (Dr. Sebastian) 3/24 - Sputum culture with MRSA 3/27  - Vomited. TF on hold. Abd film c/w ileus. Added reglan , got SSE. Did tolerate PSV all day  3/28 - BMs x2 after SSE day prior. Added back TFs at 1/2 rated. Tolerated PS 3/29 - Tolerated PS  3/31 - Tolerating CPAP PS 15/5, TF on hold due to ileus. 4/01 - Con't to hold tube feeds today, restarting vancomycin  and sending tracheal aspirate as peaks are 37, plat 24, driving 19. Thick secretions. Fever overnight.  4/02 - Peaks improved. Ongoing hiccups. Trickle feeding. Neuro exam unchanged.  4/20 - Trach was changed to cuffless #8 4/28 - Not safe to swallow. Limited communication and he follows simple commands. On TF 5/01 - On TC 28%, with large secretions. Working with speech and he is improving to initiate PMV trials under ST supervision   5/05 - On TC 30%, 7 L/min. Trach #6 cuffless, changed 5/1 to facilitate PMV trials  5/12 - On TC 21%, 6 L/min. Trach #6 cuffless, unable to produce sounds or speak on PMV. No significant resp secretions. On PEG TF 6/10 - Tracheostomy was removed due to failure to well provide adequate airway 02/15/2024 pulmonary critical care consult. 7/07 Patient obtunded and desaturating on NRB; transfer to icu for intubation; updated mother over phone who wants to reverse code status to full code; ent consulted 7/09 Remains intubated on no continuous sedation, right eye open but does not track movement  7/16 Revision ENT trach with [facility] Flap 7/18 Weaned on vent 5 hours  7/19 Tolerated trach collar for the majority of the day but had some increased work of breath  resulting in being placed back on vent support  7/20 No issues overnight, back on ATC this AM 7/21 Did not tolerate ATC. Placed back on PRVC. 7/24 ATC until afternoon and went on vent for WOB reportedly  7/25 ATC for a few hours  7/26 ATC x 6h 7/28 tolerating trach collar-tolerated for most of the day   Interim History / Subjective:   Did not tolerate weaning this morning - Remains on vent  Objective:    Blood pressure (!) 140/95, pulse (!) 106, temperature 99.5 F (37.5 C), temperature source Axillary, resp. rate (!) 34, height 5' 8 (1.727 m), weight 93 kg, SpO2 100%.    Vent Mode: PSV;CPAP FiO2 (%):  [30 %-40 %] 40 % Set Rate:  [20 bmp] 20 bmp Vt Set:  [540 mL] 540 mL PEEP:  [5 cmH20] 5 cmH20 Pressure Support:  [12 cmH20] 12 cmH20 Plateau Pressure:  [18 cmH20] 18 cmH20   Intake/Output Summary (Last 24 hours) at 03/18/2024 0942 Last data filed at 03/18/2024 9072 Gross per 24 hour  Intake 1930.5 ml  Output 2010 ml  Net -79.5 ml   Filed Weights   03/16/24 0359 03/17/24 0336 03/18/24 0337  Weight: 92 kg 93 kg 93 kg   Physical Examination:  General: Chronically ill-appearing HEENT: Moist oral mucosa Neuro: Locked-in CV: S1-S2 appreciated PULM: Bilateral rales GI: soft non distended  Extremities: No clubbing, no edema Skin: cdw   I reviewed last 24 h vitals and pain scores, last 48 h intake and output, last 24 h labs and trends, and last 24 h imaging results.  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 coli - Discussions was had regarding suprapubic catheter with urology 7/9 - Family not in agreement at present - Maintain Foley catheter - Foley to be changed 8/12  Hypertension continue metoprolol , amlodipine   Nutrition - Continue feeding via PEG  Anemia Thrombocytosis - Continue to follow  Continue to  wean as tolerated  Chest x-ray in a.m. Last labs 7/27-within normal limits   Dispo -insurance has been a barrier, TOC is following and seems we are still awaiting medicaid.  Hope that we can liberate him from the vent which would make this probably easier  Best Practice (right click and Reselect all SmartList Selections daily)   Diet/type: tubefeeds and NPO w/ meds via tube DVT prophylaxis: LMWH GI prophylaxis: H2B Lines: N/A and yes and it is still needed RUE DL PICC 7/7 > Foley:  Yes, and it is still needed Code Status:  full code Last date of multidisciplinary goals of care discussion 7/8   Jennet Epley, MD [place] PCCM Pager: See Tracey

## 2024-03-18 NOTE — Progress Notes (Signed)
 Nutrition Follow-up  DOCUMENTATION CODES:  Not applicable  INTERVENTION:  Transition to bolus tube feeding: 5 cartons (237ml) Jevity 1.5 daily 60ml water  flush before and after each bolus ( total) Provide additional free water  flushes 200ml q12h per CCM ( total) 60ml ProSource TF20 TID Provides 2015 kcal, 136g protein, total free water   Obtain updated bed weight when able to zero out bed scale. Discussed with RN.   NUTRITION DIAGNOSIS:  Inadequate oral intake related to inability to eat as evidenced by NPO status. - remains applicable  GOAL:  Patient will meet greater than or equal to 90% of their needs - goal met via TF  MONITOR:  Labs, Weight trends, TF tolerance, Skin  REASON FOR ASSESSMENT:  Consult Enteral/tube feeding initiation and management  ASSESSMENT:  Pt with PMH of HTN, daily mariajuana, and smoker admitted after being found down at home with pontine hemorrhagic stroke and trace SAH. UDS positive for opioids. Noted aspiration PNA on admission.  3/08 - admitted with [facility], intubated 3/14 - s/p cortrak placement (tip gastric) 3/22 - s/p tracheostomy 3/24 - s/p PEG 4/20 - Cuff changed to cuffless 5/10 - PEG dislodged, TF's stopped  5/11 - PEG replaced, TF's restarted at goal  6/10 - trach removed 7/07- desat, transferred back to 4N ICU, re-intubated  7/16 - s/p trach placement   TOC working on SNF placement. Insurance remains barrier to placement.   Continues with intermittent vent wean to trach collar.   Remains stable on tube feeds.  Will transition back to bolus tube feeding to allow time for bowel rest in between.  Discussed with CCM. Ok with additional free water  flushes of BID.   Weights throughout admission continue to remain widely variable, unable to assess for any true weight trends. Discussed with RN. Plan to zero out bed and reweigh as able with PT.   UOP: x24 hours  Medications: colace BID, MVI, miralax   BID  Labs: reviewed  Diet Order:   Diet Order             Diet NPO time specified  Diet effective midnight                   EDUCATION NEEDS:  No education needs have been identified at this time  Skin:  Skin Assessment: Reviewed RN Assessment Skin Integrity Issues:: Other (Comment) Stage II: Stage 2, perineum Other: Non pressure wound, left eye  Last BM:  7/28 type 6 medium  Height:  Ht Readings from Last 1 Encounters:  03/18/24 5' 8 (1.727 m)    Weight:  Wt Readings from Last 1 Encounters:  03/18/24 93 kg    Ideal Body Weight:  70 kg  BMI:  Body mass index is 31.17 kg/m.  Estimated Nutritional Needs:   Kcal:  2000-2200  Protein:  125-140 grams  Fluid:  >2 L/day  Royce Maris, RDN, LDN Clinical Nutrition See AMiON for contact information.

## 2024-03-19 ENCOUNTER — Inpatient Hospital Stay (HOSPITAL_COMMUNITY)

## 2024-03-19 DIAGNOSIS — G835 Locked-in state: Secondary | ICD-10-CM

## 2024-03-19 DIAGNOSIS — J15212 Pneumonia due to Methicillin resistant Staphylococcus aureus: Secondary | ICD-10-CM | POA: Diagnosis not present

## 2024-03-19 DIAGNOSIS — I10 Essential (primary) hypertension: Secondary | ICD-10-CM | POA: Diagnosis not present

## 2024-03-19 DIAGNOSIS — I613 Nontraumatic intracerebral hemorrhage in brain stem: Secondary | ICD-10-CM | POA: Diagnosis not present

## 2024-03-19 LAB — BASIC METABOLIC PANEL WITH GFR
Anion gap: 9 (ref 5–15)
BUN: 19 mg/dL (ref 6–20)
CO2: 26 mmol/L (ref 22–32)
Calcium: 8.7 mg/dL — ABNORMAL LOW (ref 8.9–10.3)
Chloride: 102 mmol/L (ref 98–111)
Creatinine, Ser: 0.63 mg/dL (ref 0.61–1.24)
GFR, Estimated: >60 mL/min (ref >60)
Glucose, Bld: 85 mg/dL (ref 70–99)
Potassium: 3.4 mmol/L — ABNORMAL LOW (ref 3.5–5.1)
Sodium: 137 mmol/L (ref 135–145)

## 2024-03-19 LAB — CBC WITH DIFFERENTIAL/PLATELET
Abs Immature Granulocytes: 0.03 K/uL (ref 0.00–0.07)
Basophils Absolute: 0 K/uL (ref 0.0–0.1)
Basophils Relative: 0 %
Eosinophils Absolute: 0.2 K/uL (ref 0.0–0.5)
Eosinophils Relative: 2 %
HCT: 26.5 % — ABNORMAL LOW (ref 39.0–52.0)
Hemoglobin: 7.9 g/dL — ABNORMAL LOW (ref 13.0–17.0)
Immature Granulocytes: 0 %
Lymphocytes Relative: 28 %
Lymphs Abs: 2.1 K/uL (ref 0.7–4.0)
MCH: 27.5 pg (ref 26.0–34.0)
MCHC: 29.8 g/dL — ABNORMAL LOW (ref 30.0–36.0)
MCV: 92.3 fL (ref 80.0–100.0)
Monocytes Absolute: 0.7 K/uL (ref 0.1–1.0)
Monocytes Relative: 10 %
Neutro Abs: 4.5 K/uL (ref 1.7–7.7)
Neutrophils Relative %: 60 %
Platelets: 350 K/uL (ref 150–400)
RBC: 2.87 MIL/uL — ABNORMAL LOW (ref 4.22–5.81)
RDW: 16.2 % — ABNORMAL HIGH (ref 11.5–15.5)
WBC: 7.5 K/uL (ref 4.0–10.5)
nRBC: 0 % (ref 0.0–0.2)

## 2024-03-19 LAB — GLUCOSE, CAPILLARY
Glucose-Capillary: 120 mg/dL — ABNORMAL HIGH (ref 70–99)
Glucose-Capillary: 128 mg/dL — ABNORMAL HIGH (ref 70–99)
Glucose-Capillary: 88 mg/dL (ref 70–99)
Glucose-Capillary: 93 mg/dL (ref 70–99)

## 2024-03-19 MED ORDER — POTASSIUM CHLORIDE 20 MEQ PO PACK
40.0000 meq | PACK | Freq: Once | ORAL | Status: AC
  Administered 2024-03-19: 40 meq
  Filled 2024-03-19: qty 2

## 2024-03-19 NOTE — Progress Notes (Signed)
 NAME:  Garrett [name], MRN:  [id number], DOB:  06-16-1976, LOS: 144 ADMISSION DATE:  10/27/2023, CONSULTATION DATE:  10/27/2023 REFERRING MD: Madelyne - TRH, CHIEF COMPLAINT:  Found down   History of Present Illness:  48 y/o man who was found down at home. PMHx significant for HTN.  Patient was down for an unknown amount of time, found by his fiance when she came home from work, reportedly with agonal breathing. Patient had driven her to work the morning of admission.  He has HTN but never checks his BP and does not follow with MD. Patient's fiance states that she can tell when his BP is really high; his eyes get blood shot and he is sweating. No h/o seizures. Besides almost daily marijuana and cigarette smoking, she is not aware of any other drug use. Drug screen positive for opioids and he was given several doses of Narcan . 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 BP in ED 262/156. CXR revealed RUL and perihilar infiltrates suggesting aspiration pneumonia. ED labs also revealed PH 7.169, LA 4.4, CPK 985. Patient was intubated in ED.  PCCM consulted.  Pertinent Medical History:  Hypertension  Significant Hospital Events: Including procedures, antibiotic start and stop dates in addition to other pertinent events   3/08 - Intubated in ED, pontine bleed/SAH. CT head 17:09 Acute large hemorrhage 4.1 cm in pons with intraventricular extension without hydrocephalus 3/09 - CTA 03:14 AM No change in IPH in pons, similar biparietal [name] 3/14 - Now awake, appears locked in can follow commands with eyes. 3/16 - Purposeful with left upper extremity  3/22 - Tracheostomy placed at bedside, bleeding issues overnight from trach site 3/24 - PEG (Dr. Sebastian) 3/24 - Sputum culture with MRSA 3/27  - Vomited. TF on hold. Abd film c/w ileus. Added reglan , got SSE. Did tolerate PSV all day  3/28 - BMs x2 after SSE day prior. Added back TFs at 1/2 rated. Tolerated PS 3/29 - Tolerated PS  3/31 - Tolerating CPAP PS 15/5, TF on hold due to ileus. 4/01 - Con't to hold tube feeds today, restarting vancomycin  and sending tracheal aspirate as peaks are 37, plat 24, driving 19. Thick secretions. Fever overnight.  4/02 - Peaks improved. Ongoing hiccups. Trickle feeding. Neuro exam unchanged.  4/20 - Trach was changed to cuffless #8 4/28 - Not safe to swallow. Limited communication and he follows simple commands. On TF 5/01 - On TC 28%, with large secretions. Working with speech and he is improving to initiate PMV trials under ST supervision   5/05 - On TC 30%, 7 L/min. Trach #6 cuffless, changed 5/1 to facilitate PMV trials  5/12 - On TC 21%, 6 L/min. Trach #6 cuffless, unable to produce sounds or speak on PMV. No significant resp secretions. On PEG TF 6/10 - Tracheostomy was removed due to failure to well provide adequate airway 02/15/2024 pulmonary critical care consult. 7/07 Patient obtunded and desaturating on NRB; transfer to icu for intubation; updated mother over phone who wants to reverse code status to full code; ent consulted 7/09 Remains intubated on no continuous sedation, right eye open but does not track movement  7/16 Revision ENT trach with [facility] Flap 7/18 Weaned on vent 5 hours  7/19 Tolerated trach collar for the majority of the day but had some increased work of breath  resulting in being placed back on vent support  7/20 No issues overnight, back on ATC this AM 7/21 Did not tolerate ATC. Placed back on PRVC. 7/24 ATC until afternoon and went on vent for WOB reportedly  7/25 ATC for a few hours  7/26 ATC x 6h 7/28 tolerating trach collar-tolerated for most of the day 7/30 on vent overnight and back on trach collar today  Interim History / Subjective:   Able to transition off  the vent to trach collar this AM  Objective:   Blood pressure (!) 140/93, pulse 69, temperature 97.8 F (36.6 C), temperature source Axillary, resp. rate (!) 25, height 5' 8 (1.727 m), weight 93 kg, SpO2 96%.    Vent Mode: PRVC FiO2 (%):  [35 %-40 %] 35 % Set Rate:  [20 bmp] 20 bmp Vt Set:  [540 mL] 540 mL PEEP:  [5 cmH20] 5 cmH20 Plateau Pressure:  [17 cmH20-22 cmH20] 18 cmH20   Intake/Output Summary (Last 24 hours) at 03/19/2024 9047 Last data filed at 03/19/2024 0501 Gross per 24 hour  Intake 1354 ml  Output 750 ml  Net 604 ml   Filed Weights   03/17/24 0336 03/18/24 0337 03/19/24 0338  Weight: 93 kg 93 kg 93 kg   Physical Examination:  General:  critically ill appearing M in NAD HEENT: MM pink/moist, sclera anicteric, trach in place  Neuro: locked in, eyes open, tracks  CV: s1s2 rrr, no m/r/g PULM:  on trach collar with shallow respirations but no distress and no rhonchi or wheezing  GI: soft, non-distended  Extremities: warm/dry, no  edema  Skin: no rashes or lesions    Resolved Problem List:  Post sedation hypotension Left eye conjunctivitis - resolved  Hyperglycemia, resolved  Assessment and Plan:   Acute on chronic respiratory failure in the setting of brain bleed Status post redo tracheostomy History of MRSA pneumonia, Klebsiella pneumonia, recurrent aspiration pneumonia Tracheostomy 3/22-6/10, reintubated 7/7, ENT tracheostomy 7/16 - Will continue to wean as tolerated, tolerating trach collar today, but still requiring the vent at night  - Continue pulmonary toileting  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 coli - Discussions was had regarding suprapubic catheter with urology 7/9 - Family not in agreement at present - Maintain Foley catheter - Foley to be changed 8/12  Hypertension continue metoprolol ,  amlodipine   Nutrition - Continue feeding via PEG  Anemia Thrombocytosis - Continue to follow  Continue to wean as tolerated  Chest x-ray in a.m. Last labs 7/27-within normal limits   Dispo -insurance has been a barrier, TOC is following and seems we are still awaiting medicaid.  Hope that we can liberate him from the vent which would make this probably easier  Best Practice (right click and Reselect all SmartList Selections daily)   Diet/type: tubefeeds and NPO w/ meds via tube DVT prophylaxis: LMWH GI prophylaxis: H2B Lines: N/A and yes and it is still needed RUE DL PICC 7/7 > Foley:  Yes, and it is still needed Code Status:  full code Last date of multidisciplinary goals of care discussion 7/8   Leita SAUNDERS Maysel Mccolm, PA-C [facility] See Amion for pager If no response to pager , please call [phone] until 7pm After 7:00 pm call Elink  [phone]

## 2024-03-19 NOTE — Progress Notes (Signed)
 Occupational Therapy Treatment Patient Details Name: Garrett [name] MRN: [id number] DOB: 1976/08/04 Today's Date: 03/19/2024   History of present illness Pt is a 48 yo male who was found down 10/27/23 with agonal breathing. 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 Intubated 3/8, cortrak placed 3/14, trach placed 3/22, PEG placed 3/24. Reintubated 02/25/2024 and trach 7/16.  PMH: HTN, smoker   OT comments  Patient supine in bed. PROM to UES, some AROM to L UE with initiation for grasp, wrist f/e, and elbow f/e; grasp/release of roll of tape in L hand.  Improved response when command is stated after his name.  Able to give thumbs up, but very inconsistent with answering yes/no questions. Total assist +2 for upright from chair position. Will continue to follow acutely 1x/week.        If plan is discharge home, recommend the following:  Other (comment) (total care)   Equipment Recommendations  Other (comment) (total care)    Recommendations for Other Services      Precautions / Restrictions Precautions Precautions: Fall Recall of Precautions/Restrictions: Impaired Precaution/Restrictions Comments: PEG, trach SBP < 160 Splint/Cast: Bil resting hand splints Restrictions Weight Bearing Restrictions Per Provider Order: No       Mobility Bed Mobility Overal bed mobility: Needs Assistance Bed Mobility: Supine to Sit, Sit to Supine, Rolling     Supine to sit: +2 for physical assistance Sit to supine: Total assist, +2 for physical assistance   General bed mobility comments: utilized bed in chair position, once torso elevated utilized sheet to bring torso forward to seated position, with total x 2    Transfers                         Balance Overall balance assessment: Needs assistance Sitting-balance  support: Feet unsupported, No upper extremity supported Sitting balance-Leahy Scale: Zero Sitting balance - Comments: pt is total Ax2 for bringing torso off bed to upright chair position, x2 , utilized bed sheet to provide more even support and forward lean improved Postural control: Posterior lean                                 ADL either performed or assessed with clinical judgement   ADL                                         General ADL Comments: total (A)    Extremity/Trunk Assessment Upper Extremity Assessment Upper Extremity Assessment: LUE deficits/detail;RUE deficits/detail RUE Deficits / Details: noted to have some contracture at the MCP with hooking hand position when in neutral position. PROM WFL RUE Sensation: decreased proprioception;decreased light touch RUE Coordination: decreased fine motor;decreased gross motor LUE Deficits / Details: some grasp strength, pt with activation and intiation noted. pt demonstrates some grasp and release, wrist flexion/extension and elbow flexion/extension; no subluxation and no scapula activation noted LUE Sensation: decreased proprioception LUE Coordination: decreased fine motor;decreased gross motor   Lower Extremity Assessment Lower Extremity Assessment: Defer to PT evaluation        Vision   Vision Assessment?: Vision impaired- to be further tested in functional context   Perception     Praxis  Communication Communication Communication: Impaired Factors Affecting Communication: Trach/intubated   Cognition Arousal: Alert Behavior During Therapy: Flat affect Cognition: Difficult to assess Difficult to assess due to: Tracheostomy           OT - Cognition Comments: trach, pt follows simple commands with L UE and increased time.  no verbal responses.                 Following commands: Impaired Following commands impaired: Follows one step commands inconsistently       Cueing   Cueing Techniques: Verbal cues, Tactile cues, Visual cues, Gestural cues  Exercises Exercises: General Upper Extremity General Exercises - Upper Extremity Shoulder Flexion: PROM, Both, 5 reps Shoulder ABduction: PROM, Both, 5 reps, Seated Shoulder Horizontal ABduction: PROM, Both, 5 reps, Seated Shoulder Horizontal ADduction: PROM, Both, 5 reps, Seated Elbow Flexion: Both, PROM, 5 reps, Supine (AAROM L UE) Elbow Extension: PROM, Both, 5 reps, Supine (AAROM LUE) Wrist Flexion: Both, 10 reps, PROM (some AAROM L UE) Wrist Extension: PROM, Both, 10 reps (some AAROM L UE) Digit Composite Flexion: PROM, Both, 10 reps (AAROM LUE) Composite Extension: PROM, Both, 10 reps (AAROM LUE)    Shoulder Instructions       General Comments VSS on trach collar with 35% FiO2, maintaining SpO2 >90%, increased thick mucus in trach and increased drooling, utilized suction to remove as much as possible    Pertinent Vitals/ Pain       Pain Assessment Pain Assessment: Faces Faces Pain Scale: Hurts a little bit Pain Location: slight grimace with LE stretching Pain Descriptors / Indicators: Grimacing Pain Intervention(s): Limited activity within patient's tolerance, Monitored during session, Repositioned  Home Living                                          Prior Functioning/Environment              Frequency  Min 1X/week        Progress Toward Goals  OT Goals(current goals can now be found in the care plan section)  Progress towards OT goals: Progressing toward goals  Acute Rehab OT Goals Patient Stated Goal: unable OT Goal Formulation: Patient unable to participate in goal setting Time For Goal Achievement: 03/12/24 Potential to Achieve Goals: Fair  Plan      Co-evaluation    PT/OT/SLP Co-Evaluation/Treatment: Yes Reason for Co-Treatment: Complexity of the patient's impairments (multi-system involvement);Necessary to address cognition/behavior during  functional activity;For patient/therapist safety PT goals addressed during session: Mobility/safety with mobility;Balance;Strengthening/ROM OT goals addressed during session: Strengthening/ROM      AM-PAC OT 6 Clicks Daily Activity     Outcome Measure   Help from another person eating meals?: Total Help from another person taking care of personal grooming?: Total Help from another person toileting, which includes using toliet, bedpan, or urinal?: Total Help from another person bathing (including washing, rinsing, drying)?: Total Help from another person to put on and taking off regular upper body clothing?: Total Help from another person to put on and taking off regular lower body clothing?: Total 6 Click Score: 6    End of Session Equipment Utilized During Treatment: Oxygen (trach collar)  OT Visit Diagnosis: Unsteadiness on feet (R26.81);Muscle weakness (generalized) (M62.81) Hemiplegia - caused by: Other Nontraumatic intracranial hemorrhage   Activity Tolerance Patient limited by fatigue   Patient Left in bed;with call bell/phone within reach;with bed alarm  set   Nurse Communication Mobility status;Precautions        Time: 8951-8879 OT Time Calculation (min): 32 min  Charges: OT General Charges $OT Visit: 1 Visit OT Treatments $Therapeutic Activity: 8-22 mins  Etta NOVAK, OT Acute Rehabilitation Services Office [phone] Secure Chat Preferred    Etta GORMAN Hope 03/19/2024, 2:18 PM

## 2024-03-19 NOTE — Progress Notes (Signed)
 Physical Therapy Treatment Patient Details Name: Garrett [name] MRN: [id number] DOB: 1976-08-07 Today's Date: 03/19/2024   History of Present Illness Pt is a 48 yo male who was found down 10/27/23 with agonal breathing. 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 Intubated 3/8, cortrak placed 3/14, trach placed 3/22, PEG placed 3/24. Reintubated 02/25/2024 and trach 7/16.  PMH: HTN, smoker    PT Comments  Pt continues to have limited L UE movement, able to grasp and release object in L hand with increased cuing. Pt able to inconsistently utilize thumbs up to answer yes/no questions. Pt continues to need total Ax2 for any bed mobility. Utilized chair position of bed to focus session on working to  increased tolerance of seated position with posterior support from therapists. Pt requires total A for moving head into a neutral position in seated. PT will continue to see 1x/wk for progression of mobility and monitoring of improvements in mobility and communication.    If plan is discharge home, recommend the following: Two people to help with walking and/or transfers;Two people to help with bathing/dressing/bathroom;Assistance with cooking/housework;Assistance with feeding;Direct supervision/assist for medications management;Direct supervision/assist for financial management;Assist for transportation;Help with stairs or ramp for entrance;Supervision due to cognitive status   Can travel by private vehicle      No   Equipment Recommendations  Hoyer lift;Hospital bed;Wheelchair (measurements PT);Wheelchair cushion (measurements PT);BSC/3in1;Other (comment)    Recommendations for Other Services       Precautions / Restrictions Precautions Precautions: Fall Recall of Precautions/Restrictions: Impaired Precaution/Restrictions Comments:  PEG, trach SBP < 160 Splint/Cast: Bil resting hand splints Restrictions Weight Bearing Restrictions Per Provider Order: No     Mobility  Bed Mobility Overal bed mobility: Needs Assistance Bed Mobility: Supine to Sit, Sit to Supine, Rolling     Supine to sit: +2 for physical assistance Sit to supine: Total assist, +2 for physical assistance   General bed mobility comments: utilized bed in chair position, once torso elevated utilized sheet to bring torso forward to seated position, with total x 2         Modified Rankin (Stroke Patients Only) Modified Rankin (Stroke Patients Only) Pre-Morbid Rankin Score: No symptoms Modified Rankin: Severe disability     Balance Overall balance assessment: Needs assistance Sitting-balance support: Feet unsupported, No upper extremity supported Sitting balance-Leahy Scale: Zero Sitting balance - Comments: pt is total Ax2 for bringing torso off bed to upright chair position, x2 , utilized bed sheet to provide more even support and forward lean improved Postural control: Posterior lean                                  Communication Communication Communication: Impaired Factors Affecting Communication: Trach/intubated  Cognition Arousal: Alert Behavior During Therapy: Flat affect   PT - Cognitive impairments: Difficult to assess Difficult to assess due to: Impaired communication                     PT - Cognition Comments: eyes open intermittently in session, fixates more with R eye, L with more errythema and draining covered with gauze today; focused attention as eyes close quickly or deviate down when not stimulated, is able to respond with a thumbs up inconsistently Following commands: Impaired Following commands impaired: Follows one step commands inconsistently  Cueing Cueing Techniques: Verbal cues, Tactile cues, Visual cues, Gestural cues  Exercises General Exercises - Lower Extremity Ankle Circles/Pumps:  PROM, Supine, Both, 10 reps Heel Slides: PROM, 5 reps, Supine, Both Hip ABduction/ADduction: PROM, 5 reps, Supine, Both Straight Leg Raises: PROM, 5 reps, Supine, Both Hip Flexion/Marching: PROM, 5 reps, Supine, Both    General Comments General comments (skin integrity, edema, etc.): VSS on trach collar with 35% FiO2, maintaining SpO2 >90%, increased thick mucus in trach and increased drooling, utilized suction to remove as much as possible      Pertinent Vitals/Pain Pain Assessment Pain Assessment: Faces Faces Pain Scale: Hurts a little bit Pain Location: slight grimace with LE stretching Pain Descriptors / Indicators: Grimacing Pain Intervention(s): Monitored during session     PT Goals (current goals can now be found in the care plan section) Acute Rehab PT Goals PT Goal Formulation: Patient unable to participate in goal setting Time For Goal Achievement: 03/26/24 Potential to Achieve Goals: Poor Progress towards PT goals: Progressing toward goals    Frequency    Min 1X/week           Co-evaluation PT/OT/SLP Co-Evaluation/Treatment: Yes Reason for Co-Treatment: Complexity of the patient's impairments (multi-system involvement);Necessary to address cognition/behavior during functional activity;For patient/therapist safety PT goals addressed during session: Mobility/safety with mobility;Balance;Strengthening/ROM        AM-PAC PT 6 Clicks Mobility   Outcome Measure  Help needed turning from your back to your side while in a flat bed without using bedrails?: Total Help needed moving from lying on your back to sitting on the side of a flat bed without using bedrails?: Total Help needed moving to and from a bed to a chair (including a wheelchair)?: Total Help needed standing up from a chair using your arms (e.g., wheelchair or bedside chair)?: Total Help needed to walk in hospital room?: Total Help needed climbing 3-5 steps with a railing? : Total 6 Click Score:  6    End of Session Equipment Utilized During Treatment: Oxygen Activity Tolerance: Patient tolerated treatment well Patient left: in bed Nurse Communication: Mobility status PT Visit Diagnosis: Muscle weakness (generalized) (M62.81);Other symptoms and signs involving the nervous system (R29.898);Hemiplegia and hemiparesis Hemiplegia - Right/Left: Right Hemiplegia - dominant/non-dominant: Dominant Hemiplegia - caused by: Nontraumatic intracerebral hemorrhage     Time: 8951-8873 PT Time Calculation (min) (ACUTE ONLY): 38 min  Charges:    $Therapeutic Activity: 8-22 mins PT General Charges $$ ACUTE PT VISIT: 1 Visit                     Ryka Beighley B. Fleeta Lapidus PT, DPT Acute Rehabilitation Services Please use secure chat or  Call Office [phone]    [name] [facility] 03/19/2024, 11:44 AM

## 2024-03-19 NOTE — Progress Notes (Signed)
 Pt placed on ATC 10 35%. Pt tolerating well at this time, no increased WOB noted, spo2 100%. MD notified, RN aware.     03/19/24 0756  Therapy Vitals  Pulse Rate 66  Resp (!) 27  MEWS Score/Color  MEWS Score 3  MEWS Score Color Yellow  Respiratory Assessment  Assessment Type Assess only  Respiratory Pattern Regular;Unlabored  Chest Assessment Chest expansion symmetrical  Cough Productive  Sputum Amount Moderate  Sputum Color Tan;White  Sputum Consistency Thick  Sputum Specimen Source Tracheostomy tube  Bilateral Breath Sounds Diminished;Rhonchi  Oxygen Therapy/Pulse Ox  O2 Device Tracheostomy Collar  O2 Therapy Oxygen humidified  O2 Flow Rate (L/min) 10 L/min  FiO2 (%) 35 %  SpO2 100 %  Tracheostomy Shiley Flexible 6 mm Cuffed  Placement Date/Time: 03/05/24 1659   Serial / Lot #: [id number]  Expiration Date: 08/05/28  Placed By: Verlene (Comment)  Brand: Shiley Flexible  Size (mm): 6 mm  Style: Cuffed  Status Secured with trach ties  Site Assessment Clean;Dry  Site Care Cleansed;Dried;Dressing applied  Inner Cannula Care (S)  Changed/new  Ties Assessment Clean, Dry  Cuff Pressure (cm H2O)  (deflated)  Tracheostomy Equipment at bedside Yes and checklist posted at head of bed

## 2024-03-19 NOTE — Progress Notes (Signed)
 [facility] ADULT ICU REPLACEMENT PROTOCOL   The patient does apply for the [facility] Adult ICU Electrolyte Replacment Protocol based on the criteria listed below:   1.Exclusion criteria: TCTS, ECMO, Dialysis, and Myasthenia Gravis patients 2. Is GFR >/= 30 ml/min? Yes.    Patient's GFR today is >60 3. Is SCr </= 2? Yes.   Patient's SCr is 0.63 mg/dL 4. Did SCr increase >/= 0.5 in 24 hours? No. 5.Pt's weight >40kg  Yes.   6. Abnormal electrolyte(s): potassium 3.4  7. Electrolytes replaced per protocol 8.  Call MD STAT for K+ </= 2.5, Phos </= 1, or Mag </= 1 Physician:  protocol  Claretta JINNY Sharps 03/19/2024 4:51 AM

## 2024-03-20 DIAGNOSIS — J15212 Pneumonia due to Methicillin resistant Staphylococcus aureus: Secondary | ICD-10-CM | POA: Diagnosis not present

## 2024-03-20 DIAGNOSIS — I613 Nontraumatic intracerebral hemorrhage in brain stem: Secondary | ICD-10-CM | POA: Diagnosis not present

## 2024-03-20 DIAGNOSIS — G835 Locked-in state: Secondary | ICD-10-CM | POA: Diagnosis not present

## 2024-03-20 DIAGNOSIS — I10 Essential (primary) hypertension: Secondary | ICD-10-CM | POA: Diagnosis not present

## 2024-03-20 LAB — BASIC METABOLIC PANEL WITH GFR
Anion gap: 8 (ref 5–15)
BUN: 16 mg/dL (ref 6–20)
CO2: 29 mmol/L (ref 22–32)
Calcium: 8.8 mg/dL — ABNORMAL LOW (ref 8.9–10.3)
Chloride: 101 mmol/L (ref 98–111)
Creatinine, Ser: 0.42 mg/dL — ABNORMAL LOW (ref 0.61–1.24)
GFR, Estimated: >60 mL/min (ref >60)
Glucose, Bld: 85 mg/dL (ref 70–99)
Potassium: 4.1 mmol/L (ref 3.5–5.1)
Sodium: 138 mmol/L (ref 135–145)

## 2024-03-20 LAB — CBC
HCT: 30 % — ABNORMAL LOW (ref 39.0–52.0)
Hemoglobin: 9 g/dL — ABNORMAL LOW (ref 13.0–17.0)
MCH: 27.4 pg (ref 26.0–34.0)
MCHC: 30 g/dL (ref 30.0–36.0)
MCV: 91.5 fL (ref 80.0–100.0)
Platelets: 418 K/uL — ABNORMAL HIGH (ref 150–400)
RBC: 3.28 MIL/uL — ABNORMAL LOW (ref 4.22–5.81)
RDW: 16 % — ABNORMAL HIGH (ref 11.5–15.5)
WBC: 9.2 K/uL (ref 4.0–10.5)
nRBC: 0 % (ref 0.0–0.2)

## 2024-03-20 LAB — GLUCOSE, CAPILLARY
Glucose-Capillary: 92 mg/dL (ref 70–99)
Glucose-Capillary: 139 mg/dL — ABNORMAL HIGH (ref 70–99)

## 2024-03-20 NOTE — Progress Notes (Signed)
 2049- Placed patient back on the ventilator on previous settings. Patient respiratory rate and heart rate became elevated. He also had copious amount of secretions while on the [facility].  [facility]. Ayde Record RRT

## 2024-03-20 NOTE — TOC Progression Note (Signed)
 Transition of Care [facility]) - Progression Note    Patient Details  Name: Garrett [name] MRN: [id number] Date of Birth: [birth year]  [facility] CM/SW Contact  Tom-Johnson, Harvest Muskrat, RN Phone Number: 03/20/2024, 4:46 PM  Clinical Narrative:     ICM consulted for SNF/LTACH placement. CM called and spoke with patient's mother, Shawnee [phone]). CM informed Shawnee about discharge recommendation and gave her names of Facilities to choose from. Shawnee chose [facility]. CM called in recommendation to Cassandra and referral accepted pending insurance auth and bed availability. MD and LCSW notified.   ICM will continue to follow with discharge needs.            Expected Discharge Plan: Long Term Nursing Home Barriers to Discharge: Continued Medical Work up, SNF Pending Medicaid, SNF Pending bed offer, SNF Pending payor source - LOG, Inadequate or no insurance (New trach)               Expected Discharge Plan and Services In-house Referral: Clinical Social Work, Hospice / Palliative Care   Post Acute Care Choice: Skilled Nursing Facility Living arrangements for the past 2 months: Single Family Home                                       Social Drivers of Health (SDOH) Interventions SDOH Screenings   Food Insecurity: Patient Unable To Answer (10/29/2023)  Social Connections: Unknown (06/23/2023)   Received from [facility]  Tobacco Use: High Risk (10/31/2023)    Readmission Risk Interventions     No data to display

## 2024-03-20 NOTE — Progress Notes (Signed)
 NAME:  Garrett [name], MRN:  [id number], DOB:  [birth year], LOS: 145 ADMISSION DATE:  10/27/2023, CONSULTATION DATE:  10/27/2023 REFERRING MD: Regalado - TRH, CHIEF COMPLAINT:  Found down   History of Present Illness:  48 y/o man who was found down at home. PMHx significant for HTN.  Patient was down for an unknown amount of time, found by his fiance when she came home from work, reportedly with agonal breathing. Patient had driven her to work the morning of admission.  He has HTN but never checks his BP and does not follow with MD. Patient's fiance states that she can tell when his BP is really high; his eyes get blood shot and he is sweating. No h/o seizures. Besides almost daily marijuana and cigarette smoking, she is not aware of any other drug use. Drug screen positive for opioids and he was given several doses of Narcan . 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 BP in ED 262/156. CXR revealed RUL and perihilar infiltrates suggesting aspiration pneumonia. ED labs also revealed PH 7.169, LA 4.4, CPK 985. Patient was intubated in ED.  PCCM consulted.  Pertinent Medical History:  Hypertension  Significant Hospital Events: Including procedures, antibiotic start and stop dates in addition to other pertinent events   3/08 - Intubated in ED, pontine bleed/SAH. CT head 17:09 Acute large hemorrhage 4.1 cm in pons with intraventricular extension without hydrocephalus 3/09 - CTA 03:14 AM No change in IPH in pons, similar biparietal [facility] 3/14 - Now awake, appears locked in can follow commands with eyes. 3/16 - Purposeful with left upper extremity  3/22 - Tracheostomy placed at bedside, bleeding issues overnight from trach site 3/24 - PEG (Dr. Sebastian) 3/24 - Sputum culture with MRSA 3/27  - Vomited. TF on hold. Abd film c/w ileus. Added reglan , got SSE. Did tolerate PSV all day  3/28 - BMs x2 after SSE day prior. Added back TFs at 1/2 rated. Tolerated PS 3/29 - Tolerated PS  3/31 - Tolerating CPAP PS 15/5, TF on hold due to ileus. 4/01 - Con't to hold tube feeds today, restarting vancomycin  and sending tracheal aspirate as peaks are 37, plat 24, driving 19. Thick secretions. Fever overnight.  4/02 - Peaks improved. Ongoing hiccups. Trickle feeding. Neuro exam unchanged.  4/20 - Trach was changed to cuffless #8 4/28 - Not safe to swallow. Limited communication and he follows simple commands. On TF 5/01 - On TC 28%, with large secretions. Working with speech and he is improving to initiate PMV trials under ST supervision   5/05 - On TC 30%, 7 L/min. Trach #6 cuffless, changed 5/1 to facilitate PMV trials  5/12 - On TC 21%, 6 L/min. Trach #6 cuffless, unable to produce sounds or speak on PMV. No significant resp secretions. On PEG TF 6/10 - Tracheostomy was removed due to failure to well provide adequate airway 02/15/2024 pulmonary critical care consult. 7/07 Patient obtunded and desaturating on NRB; transfer to icu for intubation; updated mother over phone who wants to reverse code status to full code; ent consulted 7/09 Remains intubated on no continuous sedation, right eye open but does not track movement  7/16 Revision ENT trach with [facility] Flap 7/18 Weaned on vent 5 hours  7/19 Tolerated trach collar for the majority of the day but had some increased work of breath  resulting in being placed back on vent support  7/20 No issues overnight, back on ATC this AM 7/21 Did not tolerate ATC. Placed back on PRVC. 7/24 ATC until afternoon and went on vent for WOB reportedly  7/25 ATC for a few hours  7/26 ATC x 6h 7/28 tolerating trach collar-tolerated for most of the day 7/30 on vent overnight and back on trach collar today  Interim History / Subjective:  Tolerated trach collar for  11hrs yesterday  Objective:   Blood pressure 111/80, pulse 78, temperature 97.9 F (36.6 C), temperature source Axillary, resp. rate 20, height 5' 8 (1.727 m), weight 93 kg, SpO2 100%.    Vent Mode: PRVC FiO2 (%):  [30 %-40 %] 40 % Set Rate:  [20 bmp] 20 bmp Vt Set:  [540 mL] 540 mL PEEP:  [5 cmH20] 5 cmH20 Plateau Pressure:  [18 cmH20] 18 cmH20   Intake/Output Summary (Last 24 hours) at 03/20/2024 0804 Last data filed at 03/20/2024 0758 Gross per 24 hour  Intake 460 ml  Output 2290 ml  Net -1830 ml   Filed Weights   03/17/24 0336 03/18/24 0337 03/19/24 0338  Weight: 93 kg 93 kg 93 kg   Physical Examination:  General:  critically ill appearing M, resting in NAD HEENT: MM pink/moist, sclera anicteric, trach in place  Neuro: locked in, eyes open, tracks, not moving extremities or following commands  CV: s1s2 rrr, no m/r/g PULM:  on trach collar with shallow respirations but no distress and no rhonchi or wheezing  GI: soft, non-distended  Extremities: warm/dry, no edema Skin: no rashes or lesions    Resolved Problem List:  Post sedation hypotension Left eye conjunctivitis - resolved  Hyperglycemia, resolved  Assessment and Plan:   Acute on chronic respiratory failure in the setting of brain bleed Status post redo tracheostomy History of MRSA pneumonia, Klebsiella pneumonia, recurrent aspiration pneumonia Tracheostomy 3/22-6/10, reintubated 7/7, ENT tracheostomy 7/16 - Will continue to wean as tolerated, tolerating trach collar today, but still requiring the vent at night  -encourage trach collar at night as tolerated  - Continue pulmonary toileting  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 coli - Discussions was had regarding suprapubic catheter with urology 7/9 - Family not in agreement at present - Maintain Foley catheter - Foley to be  changed 8/12  Hypertension  continue metoprolol , amlodipine   Nutrition - Continue feeding via PEG  Anemia Thrombocytosis - Continue to follow  Continue to wean as tolerated     Dispo -insurance has been a barrier, TOC is following and seems we are still awaiting medicaid.  Plan continues to be that we can liberate him from the vent which would make this probably easier  Best Practice (right click and Reselect all SmartList Selections daily)   Diet/type: tubefeeds and NPO w/ meds via tube DVT prophylaxis: LMWH GI prophylaxis: H2B Lines: N/A and yes and it is still needed RUE DL PICC 7/7 > Foley:  Yes, and it is still needed Code Status:  full code Last date of multidisciplinary goals of care discussion 7/8   Leita SAUNDERS Jerrick Farve, PA-C [facility] See Amion for pager If no response to pager , please call [phone] until 7pm After 7:00 pm call Elink  [phone]

## 2024-03-20 NOTE — Plan of Care (Signed)
  Problem: Intracerebral Hemorrhage Tissue Perfusion: Goal: Complications of Intracerebral Hemorrhage will be minimized Outcome: Progressing   Problem: Education: Goal: Knowledge of disease or condition will improve Outcome: Progressing Goal: Knowledge of secondary prevention will improve (MUST DOCUMENT ALL) Outcome: Progressing Goal: Knowledge of patient specific risk factors will improve (DELETE if not current risk factor) Outcome: Progressing   Problem: Intracerebral Hemorrhage Tissue Perfusion: Goal: Complications of Intracerebral Hemorrhage will be minimized Outcome: Progressing   Problem: Self-Care: Goal: Ability to participate in self-care as condition permits will improve Outcome: Progressing Goal: Verbalization of feelings and concerns over difficulty with self-care will improve Outcome: Progressing Goal: Ability to communicate needs accurately will improve Outcome: Progressing   Problem: Education: Goal: Knowledge of disease or condition will improve Outcome: Progressing Goal: Knowledge of secondary prevention will improve (MUST DOCUMENT ALL) Outcome: Progressing Goal: Knowledge of patient specific risk factors will improve (DELETE if not current risk factor) Outcome: Progressing   Problem: Fluid Volume: Goal: Ability to maintain a balanced intake and output will improve Outcome: Progressing   Problem: Skin Integrity: Goal: Risk for impaired skin integrity will decrease Outcome: Progressing   Problem: Respiratory: Goal: Patent airway maintenance will improve Outcome: Progressing

## 2024-03-21 DIAGNOSIS — Z930 Tracheostomy status: Secondary | ICD-10-CM | POA: Diagnosis not present

## 2024-03-21 DIAGNOSIS — I613 Nontraumatic intracerebral hemorrhage in brain stem: Secondary | ICD-10-CM | POA: Diagnosis not present

## 2024-03-21 DIAGNOSIS — J9601 Acute respiratory failure with hypoxia: Secondary | ICD-10-CM | POA: Diagnosis not present

## 2024-03-21 DIAGNOSIS — J15212 Pneumonia due to Methicillin resistant Staphylococcus aureus: Secondary | ICD-10-CM | POA: Diagnosis not present

## 2024-03-21 LAB — CBC
HCT: 30.2 % — ABNORMAL LOW (ref 39.0–52.0)
Hemoglobin: 9 g/dL — ABNORMAL LOW (ref 13.0–17.0)
MCH: 27.5 pg (ref 26.0–34.0)
MCHC: 29.8 g/dL — ABNORMAL LOW (ref 30.0–36.0)
MCV: 92.4 fL (ref 80.0–100.0)
Platelets: 442 K/uL — ABNORMAL HIGH (ref 150–400)
RBC: 3.27 MIL/uL — ABNORMAL LOW (ref 4.22–5.81)
RDW: 15.9 % — ABNORMAL HIGH (ref 11.5–15.5)
WBC: 9.4 K/uL (ref 4.0–10.5)
nRBC: 0 % (ref 0.0–0.2)

## 2024-03-21 LAB — BASIC METABOLIC PANEL WITH GFR
Anion gap: 9 (ref 5–15)
BUN: 19 mg/dL (ref 6–20)
CO2: 29 mmol/L (ref 22–32)
Calcium: 9 mg/dL (ref 8.9–10.3)
Chloride: 102 mmol/L (ref 98–111)
Creatinine, Ser: 0.66 mg/dL (ref 0.61–1.24)
GFR, Estimated: >60 mL/min (ref >60)
Glucose, Bld: 92 mg/dL (ref 70–99)
Potassium: 3.8 mmol/L (ref 3.5–5.1)
Sodium: 140 mmol/L (ref 135–145)

## 2024-03-21 LAB — GLUCOSE, CAPILLARY
Glucose-Capillary: 119 mg/dL — ABNORMAL HIGH (ref 70–99)
Glucose-Capillary: 110 mg/dL — ABNORMAL HIGH (ref 70–99)
Glucose-Capillary: 115 mg/dL — ABNORMAL HIGH (ref 70–99)

## 2024-03-21 NOTE — TOC Progression Note (Signed)
 Transition of Care [facility]) - Progression Note    Patient Details  Name: Garrett [name] MRN: [id number] Date of Birth: Jan 15, 1976  Transition of Care [facility]) CM/SW Contact  [name], [name] Phone Number: 03/21/2024, 12:53 PM  Clinical Narrative:     Contacted [facility]; they are awaiting financial review before a decision is made on possible bed offer. TOC will continue to follow.   Expected Discharge Plan: Long Term Nursing Home Barriers to Discharge: Continued Medical Work up, SNF Pending Medicaid, SNF Pending bed offer, SNF Pending payor source - LOG, Inadequate or no insurance (New trach)               Expected Discharge Plan and Services In-house Referral: Clinical Social Work, Hospice / Palliative Care   Post Acute Care Choice: Skilled Nursing Facility Living arrangements for the past 2 months: Single Family Home                                       Social Drivers of Health (SDOH) Interventions SDOH Screenings   Food Insecurity: Patient Unable To Answer (10/29/2023)  Social Connections: Unknown (06/23/2023)   Received from [facility]  Tobacco Use: High Risk (10/31/2023)    Readmission Risk Interventions     No data to display

## 2024-03-21 NOTE — Plan of Care (Signed)
  Problem: Self-Care: Goal: Ability to communicate needs accurately will improve Outcome: Not Progressing   Problem: Coping: Goal: Will verbalize positive feelings about self Outcome: Not Progressing   Problem: Health Behavior/Discharge Planning: Goal: Goals will be collaboratively established with patient/family Outcome: Progressing   Problem: Self-Care: Goal: Ability to communicate needs accurately will improve Outcome: Not Progressing   Problem: Fluid Volume: Goal: Ability to maintain a balanced intake and output will improve Outcome: Progressing   Problem: Clinical Measurements: Goal: Respiratory complications will improve Outcome: Progressing Goal: Cardiovascular complication will be avoided Outcome: Progressing   Problem: Clinical Measurements: Goal: Cardiovascular complication will be avoided Outcome: Progressing

## 2024-03-21 NOTE — Progress Notes (Signed)
 NAME:  Garrett [name], MRN:  [id number], DOB:  October 23, 1975, LOS: 146 ADMISSION DATE:  10/27/2023, CONSULTATION DATE:  10/27/2023 REFERRING MD: Regalado - TRH, CHIEF COMPLAINT:  Found down   History of Present Illness:  48 y/o man who was found down at home. PMHx significant for HTN.  Patient was down for an unknown amount of time, found by his fiance when she came home from work, reportedly with agonal breathing. Patient had driven her to work the morning of admission.  He has HTN but never checks his BP and does not follow with MD. Patient's fiance states that she can tell when his BP is really high; his eyes get blood shot and he is sweating. No h/o seizures. Besides almost daily marijuana and cigarette smoking, she is not aware of any other drug use. Drug screen positive for opioids and he was given several doses of Narcan . 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 BP in ED 262/156. CXR revealed RUL and perihilar infiltrates suggesting aspiration pneumonia. ED labs also revealed PH 7.169, LA 4.4, CPK 985. Patient was intubated in ED.  PCCM consulted.  Pertinent Medical History:  Hypertension  Significant Hospital Events: Including procedures, antibiotic start and stop dates in addition to other pertinent events   3/08 - Intubated in ED, pontine bleed/SAH. CT head 17:09 Acute large hemorrhage 4.1 cm in pons with intraventricular extension without hydrocephalus 3/09 - CTA 03:14 AM No change in IPH in pons, similar biparietal [facility] 3/14 - Now awake, appears locked in can follow commands with eyes. 3/16 - Purposeful with left upper extremity  3/22 - Tracheostomy placed at bedside, bleeding issues overnight from trach site 3/24 - PEG (Dr. Sebastian) 3/24 - Sputum culture with MRSA 3/27  - Vomited. TF on hold. Abd film c/w ileus. Added reglan , got SSE. Did tolerate PSV all day  3/28 - BMs x2 after SSE day prior. Added back TFs at 1/2 rated. Tolerated PS 3/29 - Tolerated PS  3/31 - Tolerating CPAP PS 15/5, TF on hold due to ileus. 4/01 - Con't to hold tube feeds today, restarting vancomycin  and sending tracheal aspirate as peaks are 37, plat 24, driving 19. Thick secretions. Fever overnight.  4/02 - Peaks improved. Ongoing hiccups. Trickle feeding. Neuro exam unchanged.  4/20 - Trach was changed to cuffless #8 4/28 - Not safe to swallow. Limited communication and he follows simple commands. On TF 5/01 - On TC 28%, with large secretions. Working with speech and he is improving to initiate PMV trials under ST supervision   5/05 - On TC 30%, 7 L/min. Trach #6 cuffless, changed 5/1 to facilitate PMV trials  5/12 - On TC 21%, 6 L/min. Trach #6 cuffless, unable to produce sounds or speak on PMV. No significant resp secretions. On PEG TF 6/10 - Tracheostomy was removed due to failure to well provide adequate airway 02/15/2024 pulmonary critical care consult. 7/07 Patient obtunded and desaturating on NRB; transfer to icu for intubation; updated mother over phone who wants to reverse code status to full code; ent consulted 7/09 Remains intubated on no continuous sedation, right eye open but does not track movement  7/16 Revision ENT trach with [facility] Flap 7/18 Weaned on vent 5 hours  7/19 Tolerated trach collar for the majority of the day but had some increased work of breath  resulting in being placed back on vent support  7/20 No issues overnight, back on ATC this AM 7/21 Did not tolerate ATC. Placed back on PRVC. 7/24 ATC until afternoon and went on vent for WOB reportedly  7/25 ATC for a few hours  7/26 ATC x 6h 7/28 tolerating trach collar-tolerated for most of the day 7/30 on vent overnight and back on trach collar today 8/1 back on vent overnight   Interim History / Subjective:    Awake but not following any commands this am   Objective:   Blood pressure 105/78, pulse 79, temperature 98.3 F (36.8 C), temperature source Axillary, resp. rate 20, height 5' 8 (1.727 m), weight 97 kg, SpO2 100%.    Vent Mode: PRVC FiO2 (%):  [40 %] 40 % Set Rate:  [20 bmp] 20 bmp Vt Set:  [450 mL-540 mL] 450 mL PEEP:  [5 cmH20] 5 cmH20 Plateau Pressure:  [17 cmH20-19 cmH20] 19 cmH20   Intake/Output Summary (Last 24 hours) at 03/21/2024 1100 Last data filed at 03/21/2024 0600 Gross per 24 hour  Intake 1114 ml  Output 1300 ml  Net -186 ml   Filed Weights   03/19/24 0338 03/20/24 0800 03/21/24 0407  Weight: 93 kg 97 kg 97 kg   Physical Examination: General: Acute on chronically ill appearing adult male lying in bed on mechanical ventilation, in NAD HEENT: ETT, MM pink/moist, PERRL,  Neuro: Eyes spontaneously open, unable to follow commands  CV: s1s2 regular rate and rhythm, no murmur, rubs, or gallops,  PULM:  Diminished bilaterally, very slight increased work of breathing, tolerating ATC  GI: soft, bowel sounds active in all 4 quadrants, non-tender, non-distended, tolerating TF Extremities: warm/dry, no edema  Skin: no rashes or lesions  Resolved Problem List:  Post sedation hypotension Left eye conjunctivitis - resolved  Hyperglycemia, resolved  Assessment and Plan:   Acute on chronic respiratory failure in the setting of brain bleed Status post redo tracheostomy History of MRSA pneumonia, Klebsiella pneumonia, recurrent aspiration pneumonia Tracheostomy 3/22-6/10, reintubated 7/7, ENT tracheostomy 7/16 P: Due to increased work of breathing patient continues to require vent support at night  Goal for ATC only  Continue ventilator support with lung protective strategies  Wean PEEP and FiO2 for sats greater than 90%. Head of bed elevated 30 degrees. Plateau pressures less than 30 cm H20.  Follow intermittent chest x-ray and ABG.   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 Coli - Discussions was had regarding suprapubic catheter with urology 7/9 but Family not in agreement at present P: Continue Foley  Next Foley exchange 8/12  Hypertension  P: Continue Metoprolol  and Amlodipine    Nutrition P: Continue Tube feeds via PEG  Anemia Thrombocytosis P: Trend CBC  Transfuse per protocol  Hgb goal > 7  Dispo -insurance has been a barrier, TOC is following and seems we are still awaiting medicaid.  Plan continues to be that we can liberate him from the vent which would make this probably easier  Best Practice (right click and Reselect all SmartList Selections daily)   Diet/type: tubefeeds and NPO w/ meds via tube DVT prophylaxis: LMWH GI prophylaxis: H2B Lines: N/A and yes and it is still needed RUE  DL PICC 7/7 > Foley:  Yes, and it is still needed Code Status:  full code Last date of multidisciplinary goals of care discussion 7/8  CRITICAL CARE Performed by: Aariz Maish D. Harris   Total critical care time: 37 minutes  Critical care time was exclusive of separately billable procedures and treating other patients.  Critical care was necessary to treat or prevent imminent or life-threatening deterioration.  Critical care was time spent personally by me on the following activities: development of treatment plan with patient and/or surrogate as well as nursing, discussions with consultants, evaluation of patient's response to treatment, examination of patient, obtaining history from patient or surrogate, ordering and performing treatments and interventions, ordering and review of laboratory studies, ordering  and review of radiographic studies, pulse oximetry and re-evaluation of patient's condition.  Wynnie Pacetti D. Harris, NP-C [facility] Personal contact information can be found on Amion  If no contact or response made please call [phone] 03/21/2024, 11:14 AM

## 2024-03-22 DIAGNOSIS — I613 Nontraumatic intracerebral hemorrhage in brain stem: Secondary | ICD-10-CM | POA: Diagnosis not present

## 2024-03-22 DIAGNOSIS — Z930 Tracheostomy status: Secondary | ICD-10-CM | POA: Diagnosis not present

## 2024-03-22 DIAGNOSIS — J15212 Pneumonia due to Methicillin resistant Staphylococcus aureus: Secondary | ICD-10-CM | POA: Diagnosis not present

## 2024-03-22 DIAGNOSIS — J9601 Acute respiratory failure with hypoxia: Secondary | ICD-10-CM | POA: Diagnosis not present

## 2024-03-22 LAB — GLUCOSE, CAPILLARY
Glucose-Capillary: 147 mg/dL — ABNORMAL HIGH (ref 70–99)
Glucose-Capillary: 131 mg/dL — ABNORMAL HIGH (ref 70–99)
Glucose-Capillary: 142 mg/dL — ABNORMAL HIGH (ref 70–99)
Glucose-Capillary: 161 mg/dL — ABNORMAL HIGH (ref 70–99)
Glucose-Capillary: 166 mg/dL — ABNORMAL HIGH (ref 70–99)
Glucose-Capillary: 170 mg/dL — ABNORMAL HIGH (ref 70–99)

## 2024-03-22 NOTE — Progress Notes (Signed)
 NAME:  Garrett [name], MRN:  [id number], DOB:  1976-03-14, LOS: 147 ADMISSION DATE:  10/27/2023, CONSULTATION DATE:  10/27/2023 REFERRING MD: Regalado - TRH, CHIEF COMPLAINT:  Found down   History of Present Illness:  48 y/o man who was found down at home. PMHx significant for HTN.  Patient was down for an unknown amount of time, found by his fiance when she came home from work, reportedly with agonal breathing. Patient had driven her to work the morning of admission.  He has HTN but never checks his BP and does not follow with MD. Patient's fiance states that she can tell when his BP is really high; his eyes get blood shot and he is sweating. No h/o seizures. Besides almost daily marijuana and cigarette smoking, she is not aware of any other drug use. Drug screen positive for opioids and he was given several doses of Narcan . 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 BP in ED 262/156. CXR revealed RUL and perihilar infiltrates suggesting aspiration pneumonia. ED labs also revealed PH 7.169, LA 4.4, CPK 985. Patient was intubated in ED.  PCCM consulted.  Pertinent Medical History:  Hypertension  Significant Hospital Events: Including procedures, antibiotic start and stop dates in addition to other pertinent events   3/08 - Intubated in ED, pontine bleed/SAH. CT head 17:09 Acute large hemorrhage 4.1 cm in pons with intraventricular extension without hydrocephalus 3/09 - CTA 03:14 AM No change in IPH in pons, similar biparietal [facility] 3/14 - Now awake, appears locked in can follow commands with eyes. 3/16 - Purposeful with left upper extremity  3/22 - Tracheostomy placed at bedside, bleeding issues overnight from trach site 3/24 - PEG (Dr. Sebastian) 3/24 - Sputum culture with MRSA 3/27  - Vomited. TF on hold. Abd film c/w ileus. Added reglan , got SSE. Did tolerate PSV all day  3/28 - BMs x2 after SSE day prior. Added back TFs at 1/2 rated. Tolerated PS 3/29 - Tolerated PS  3/31 - Tolerating CPAP PS 15/5, TF on hold due to ileus. 4/01 - Con't to hold tube feeds today, restarting vancomycin  and sending tracheal aspirate as peaks are 37, plat 24, driving 19. Thick secretions. Fever overnight.  4/02 - Peaks improved. Ongoing hiccups. Trickle feeding. Neuro exam unchanged.  4/20 - Trach was changed to cuffless #8 4/28 - Not safe to swallow. Limited communication and he follows simple commands. On TF 5/01 - On TC 28%, with large secretions. Working with speech and he is improving to initiate PMV trials under ST supervision   5/05 - On TC 30%, 7 L/min. Trach #6 cuffless, changed 5/1 to facilitate PMV trials  5/12 - On TC 21%, 6 L/min. Trach #6 cuffless, unable to produce sounds or speak on PMV. No significant resp secretions. On PEG TF 6/10 - Tracheostomy was removed due to failure to well provide adequate airway 02/15/2024 pulmonary critical care consult. 7/07 Patient obtunded and desaturating on NRB; transfer to icu for intubation; updated mother over phone who wants to reverse code status to full code; ent consulted 7/09 Remains intubated on no continuous sedation, right eye open but does not track movement  7/16 Revision ENT trach with Garrett [name] 7/18 Weaned on vent 5 hours  7/19 Tolerated trach collar for the majority of the day but had some increased work of breath  resulting in being placed back on vent support  7/20 No issues overnight, back on ATC this AM 7/21 Did not tolerate ATC. Placed back on PRVC. 7/24 ATC until afternoon and went on vent for WOB reportedly  7/25 ATC for a few hours  7/26 ATC x 6h 7/28 tolerating trach collar-tolerated for most of the day 7/30 on vent overnight and back on trach collar today 8/1 back on vent overnight  8/2 was able to maintain off of  ventilator overnight but by 6 AM today patient was seen with signs of increased work of breathing including tachycardia therefore patient was placed back on ventilator  Interim History / Subjective:  Resting on ventilator  Objective:   Blood pressure (!) 143/110, pulse (!) 130, temperature 99.1 F (37.3 C), temperature source Axillary, resp. rate (!) 25, height 5' 8 (1.727 m), weight 92 kg, SpO2 100%.    Vent Mode: PRVC FiO2 (%):  [30 %-40 %] 40 % Set Rate:  [20 bmp] 20 bmp Vt Set:  [540 mL] 540 mL PEEP:  [5 cmH20] 5 cmH20 Plateau Pressure:  [23 cmH20-25 cmH20] 25 cmH20   Intake/Output Summary (Last 24 hours) at 03/22/2024 0946 Last data filed at 03/22/2024 0800 Gross per 24 hour  Intake 934 ml  Output 1750 ml  Net -816 ml   Filed Weights   03/20/24 0800 03/21/24 0407 03/22/24 0500  Weight: 97 kg 97 kg 92 kg   Physical Examination: General: Acute on chronic ill-appearing adult male lying in bed on mechanical ventilation via tracheostomy in no acute distress HEENT: Trach midline, MM pink/moist, PERRL,  Neuro: Sleeping on ventilator CV: s1s2 regular rate and rhythm, no murmur, rubs, or gallops,  PULM: Bilateral rhonchi, no increased work of breathing on ventilator GI: soft, bowel sounds active in all 4 quadrants, non-tender, non-distended, tolerating TF Extremities: warm/dry, no edema  Skin: no rashes or lesions  Resolved Problem List:  Post sedation hypotension Left eye conjunctivitis - resolved  Hyperglycemia, resolved  Assessment and Plan:   Acute on chronic respiratory failure in the setting of brain bleed Status post redo tracheostomy History of MRSA pneumonia, Klebsiella pneumonia, recurrent aspiration pneumonia Tracheostomy 3/22-6/10, reintubated 7/7, ENT tracheostomy 7/16 P: Patient continues to require ventilator support due to increased work of breathing after extended time off of ventilator He was able to maintain off of ventilator overnight but by 6 AM today  patient was seen with signs of increased work of breathing including tachycardia : Continue ventilator support with lung protective strategies  Wean PEEP and FiO2 for sats greater than 90%. Head of bed elevated 30 degrees. Plateau pressures less than 30 cm H20.  Follow intermittent chest x-ray and ABG.   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 Coli - Discussions was had regarding suprapubic catheter with urology 7/9 but Family not in agreement at present P: Continue Foley Foley exchange 8/12  Hypertension  P: Continue metoprolol  and amlodipine   Nutrition P: Continue tube feeds via PEG  Anemia Thrombocytosis P: Trend CBC Transfuse per protocol Hemoglobin goal greater than 7  Dispo -insurance has been a barrier, TOC is following and seems we are still awaiting medicaid.  Plan continues to be that we can liberate him from the vent  which would make this probably easier  Best Practice (right click and Reselect all SmartList Selections daily)   Diet/type: tubefeeds and NPO w/ meds via tube DVT prophylaxis: LMWH GI prophylaxis: H2B Lines: N/A and yes and it is still needed RUE DL PICC 7/7 > Foley:  Yes, and it is still needed Code Status:  full code Last date of multidisciplinary goals of care discussion 7/8  CRITICAL CARE Performed by: Garrett [name]   Total critical care time: 35 minutes  Critical care time was exclusive of separately billable procedures and treating other patients.  Critical care was necessary to treat or prevent imminent or life-threatening deterioration.  Critical care was time spent personally by me on the  following activities: development of treatment plan with patient and/or surrogate as well as nursing, discussions with consultants, evaluation of patient's response to treatment, examination of patient, obtaining history from patient or surrogate, ordering and performing treatments and interventions, ordering and review of laboratory studies, ordering and review of radiographic studies, pulse oximetry and re-evaluation of patient's condition.  Garrett Lizardo D. Harris, NP-C [facility] Personal contact information can be found on Amion  If no contact or response made please call [phone] 03/22/2024, 9:46 AM

## 2024-03-22 NOTE — Plan of Care (Signed)
  Problem: Nutrition: Goal: Risk of aspiration will decrease Outcome: Progressing   Problem: Health Behavior/Discharge Planning: Goal: Goals will be collaboratively established with patient/family Outcome: Progressing   Problem: Self-Care: Goal: Ability to communicate needs accurately will improve Outcome: Not Progressing   Problem: Education: Goal: Knowledge of disease or condition will improve Outcome: Not Progressing   Problem: Coping: Goal: Will verbalize positive feelings about self Outcome: Not Progressing

## 2024-03-22 NOTE — Plan of Care (Signed)
  Problem: Health Behavior/Discharge Planning: Goal: Goals will be collaboratively established with patient/family Outcome: Progressing   Problem: Self-Care: Goal: Ability to communicate needs accurately will improve Outcome: Not Progressing   Problem: Health Behavior/Discharge Planning: Goal: Goals will be collaboratively established with patient/family Outcome: Progressing   Problem: Pain Managment: Goal: General experience of comfort will improve and/or be controlled Outcome: Progressing   Problem: Respiratory: Goal: Patent airway maintenance will improve Outcome: Progressing

## 2024-03-23 DIAGNOSIS — Z930 Tracheostomy status: Secondary | ICD-10-CM | POA: Diagnosis not present

## 2024-03-23 DIAGNOSIS — I613 Nontraumatic intracerebral hemorrhage in brain stem: Secondary | ICD-10-CM | POA: Diagnosis not present

## 2024-03-23 DIAGNOSIS — J15212 Pneumonia due to Methicillin resistant Staphylococcus aureus: Secondary | ICD-10-CM | POA: Diagnosis not present

## 2024-03-23 DIAGNOSIS — J9601 Acute respiratory failure with hypoxia: Secondary | ICD-10-CM | POA: Diagnosis not present

## 2024-03-23 LAB — GLUCOSE, CAPILLARY
Glucose-Capillary: 107 mg/dL — ABNORMAL HIGH (ref 70–99)
Glucose-Capillary: 128 mg/dL — ABNORMAL HIGH (ref 70–99)
Glucose-Capillary: 132 mg/dL — ABNORMAL HIGH (ref 70–99)
Glucose-Capillary: 144 mg/dL — ABNORMAL HIGH (ref 70–99)

## 2024-03-23 NOTE — Progress Notes (Signed)
 NAME:  Garrett [name], MRN:  [id number], DOB:  10-02-1975, LOS: 148 ADMISSION DATE:  10/27/2023, CONSULTATION DATE:  10/27/2023 REFERRING MD: Regalado - TRH, CHIEF COMPLAINT:  Found down   History of Present Illness:  48 y/o man who was found down at home. PMHx significant for HTN.  Patient was down for an unknown amount of time, found by his fiance when she came home from work, reportedly with agonal breathing. Patient had driven her to work the morning of admission.  He has HTN but never checks his BP and does not follow with MD. Patient's fiance states that she can tell when his BP is really high; his eyes get blood shot and he is sweating. No h/o seizures. Besides almost daily marijuana and cigarette smoking, she is not aware of any other drug use. Drug screen positive for opioids and he was given several doses of Narcan . 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 BP in ED 262/156. CXR revealed RUL and perihilar infiltrates suggesting aspiration pneumonia. ED labs also revealed PH 7.169, LA 4.4, CPK 985. Patient was intubated in ED.  PCCM consulted.  Pertinent Medical History:  Hypertension  Significant Hospital Events: Including procedures, antibiotic start and stop dates in addition to other pertinent events   3/08 - Intubated in ED, pontine bleed/SAH. CT head 17:09 Acute large hemorrhage 4.1 cm in pons with intraventricular extension without hydrocephalus 3/09 - CTA 03:14 AM No change in IPH in pons, similar biparietal [facility] 3/14 - Now awake, appears locked in can follow commands with eyes. 3/16 - Purposeful with left upper extremity  3/22 - Tracheostomy placed at bedside, bleeding issues overnight from trach site 3/24 - PEG (Dr. Sebastian) 3/24 - Sputum culture with MRSA 3/27  - Vomited. TF on hold. Abd film c/w ileus. Added reglan , got SSE. Did tolerate PSV all day  3/28 - BMs x2 after SSE day prior. Added back TFs at 1/2 rated. Tolerated PS 3/29 - Tolerated PS  3/31 - Tolerating CPAP PS 15/5, TF on hold due to ileus. 4/01 - Con't to hold tube feeds today, restarting vancomycin  and sending tracheal aspirate as peaks are 37, plat 24, driving 19. Thick secretions. Fever overnight.  4/02 - Peaks improved. Ongoing hiccups. Trickle feeding. Neuro exam unchanged.  4/20 - Trach was changed to cuffless #8 4/28 - Not safe to swallow. Limited communication and he follows simple commands. On TF 5/01 - On TC 28%, with large secretions. Working with speech and he is improving to initiate PMV trials under ST supervision   5/05 - On TC 30%, 7 L/min. Trach #6 cuffless, changed 5/1 to facilitate PMV trials  5/12 - On TC 21%, 6 L/min. Trach #6 cuffless, unable to produce sounds or speak on PMV. No significant resp secretions. On PEG TF 6/10 - Tracheostomy was removed due to failure to well provide adequate airway 02/15/2024 pulmonary critical care consult. 7/07 Patient obtunded and desaturating on NRB; transfer to icu for intubation; updated mother over phone who wants to reverse code status to full code; ent consulted 7/09 Remains intubated on no continuous sedation, right eye open but does not track movement  7/16 Revision ENT trach with [facility] Flap 7/18 Weaned on vent 5 hours  7/19 Tolerated trach collar for the majority of the day but had some increased work of breath  resulting in being placed back on vent support  7/20 No issues overnight, back on ATC this AM 7/21 Did not tolerate ATC. Placed back on PRVC. 7/24 ATC until afternoon and went on vent for WOB reportedly  7/25 ATC for a few hours  7/26 ATC x 6h 7/28 tolerating trach collar-tolerated for most of the day 7/30 on vent overnight and back on trach collar today 8/1 back on vent overnight  8/2 was able to maintain off of  ventilator overnight but by 6 AM today patient was seen with signs of increased work of breathing including tachycardia therefore patient was placed back on ventilator 8/3 remained on ventilator overnight to transition to trach collar this a.m.  Interim History / Subjective:  Resting on ventilator during my assessment  Objective:   Blood pressure 90/67, pulse 86, temperature 98.8 F (37.1 C), temperature source Axillary, resp. rate (!) 32, height 5' 8 (1.727 m), weight 92 kg, SpO2 97%.    Vent Mode: PSV;CPAP FiO2 (%):  [40 %] 40 % Set Rate:  [20 bmp] 20 bmp Vt Set:  [540 mL] 540 mL PEEP:  [5 cmH20] 5 cmH20 Pressure Support:  [10 cmH20] 10 cmH20 Plateau Pressure:  [23 cmH20] 23 cmH20   Intake/Output Summary (Last 24 hours) at 03/23/2024 1106 Last data filed at 03/23/2024 1022 Gross per 24 hour  Intake 2248 ml  Output 1765 ml  Net 483 ml   Filed Weights   03/21/24 0407 03/22/24 0500 03/23/24 0651  Weight: 97 kg 92 kg 92 kg   Physical Examination: General: Acute on chronic ill-appearing deconditioned adult male lying in bed on mechanical ventilation in no acute distress HEENT: Trach midline, MM pink/moist, PERRL,  Neuro: Intermittently opens eyes to verbal command CV: s1s2 regular rate and rhythm, no murmur, rubs, or gallops,  PULM: Bilateral rhonchi, tolerating ventilator, no increased work of breathing on vent GI: soft, bowel sounds active in all 4 quadrants, non-tender, non-distended, tolerating TF Extremities: warm/dry, no edema  Skin: no rashes or lesions  Resolved Problem List:  Post sedation hypotension Left eye conjunctivitis - resolved  Hyperglycemia, resolved  Assessment and Plan:   Acute on chronic respiratory failure in the setting of brain bleed Status post redo tracheostomy History of MRSA pneumonia, Klebsiella pneumonia, recurrent aspiration pneumonia Tracheostomy 3/22-6/10, reintubated 7/7, ENT tracheostomy 7/16 P: Ongoing daily attempts to transition and  maintained ATC Continue ventilator support with lung protective strategies  Wean PEEP and FiO2 for sats greater than 90%. Head of bed elevated 30 degrees. Plateau pressures less than 30 cm H20.  Follow intermittent chest x-ray and ABG.   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 Coli - Discussions was had regarding suprapubic catheter with urology 7/9 but Family not in agreement at present P: Continue Foley Foley exchange planned 8/12  Hypertension  P: Continue metoprolol  and amlodipine   Nutrition P: Continue tube feeds via PEG  Anemia Thrombocytosis P: Trend CBC Transfuse per protocol Hemoglobin goal >7  Dispo -insurance has been a barrier, TOC is following and seems we are still awaiting medicaid.  Plan continues to be that we can liberate him from the vent which would make this probably easier  Best Practice (right click and Reselect all SmartList Selections daily)  Diet/type: tubefeeds and NPO w/ meds via tube DVT prophylaxis: LMWH GI prophylaxis: H2B Lines: N/A and yes and it is still needed RUE DL PICC 7/7 > Foley:  Yes, and it is still needed Code Status:  full code Last date of multidisciplinary goals of care discussion 7/8  CRITICAL CARE Performed by: Amera Banos D. Harris   Total critical care time: 35 minutes  Critical care time was exclusive of separately billable procedures and treating other patients.  Critical care was necessary to treat or prevent imminent or life-threatening deterioration.  Critical care was time spent personally by me on the following activities: development of treatment plan with patient and/or surrogate as  well as nursing, discussions with consultants, evaluation of patient's response to treatment, examination of patient, obtaining history from patient or surrogate, ordering and performing treatments and interventions, ordering and review of laboratory studies, ordering and review of radiographic studies, pulse oximetry and re-evaluation of patient's condition.  Arlen Dupuis D. Harris, NP-C [facility] Personal contact information can be found on Amion  If no contact or response made please call [phone] 03/23/2024, 11:06 AM

## 2024-03-24 DIAGNOSIS — I613 Nontraumatic intracerebral hemorrhage in brain stem: Secondary | ICD-10-CM | POA: Diagnosis not present

## 2024-03-24 DIAGNOSIS — G935 Compression of brain: Secondary | ICD-10-CM | POA: Diagnosis not present

## 2024-03-24 DIAGNOSIS — Z930 Tracheostomy status: Secondary | ICD-10-CM | POA: Diagnosis not present

## 2024-03-24 DIAGNOSIS — J9620 Acute and chronic respiratory failure, unspecified whether with hypoxia or hypercapnia: Secondary | ICD-10-CM | POA: Diagnosis not present

## 2024-03-24 LAB — GLUCOSE, CAPILLARY
Glucose-Capillary: 104 mg/dL — ABNORMAL HIGH (ref 70–99)
Glucose-Capillary: 129 mg/dL — ABNORMAL HIGH (ref 70–99)
Glucose-Capillary: 167 mg/dL — ABNORMAL HIGH (ref 70–99)
Glucose-Capillary: 97 mg/dL (ref 70–99)

## 2024-03-24 LAB — MAGNESIUM: Magnesium: 2.1 mg/dL (ref 1.7–2.4)

## 2024-03-24 LAB — MRSA NEXT GEN BY PCR, NASAL

## 2024-03-24 LAB — PHOSPHORUS: Phosphorus: 3.9 mg/dL (ref 2.5–4.6)

## 2024-03-24 NOTE — TOC Progression Note (Addendum)
 Transition of Care [facility]) - Progression Note    Patient Details  Name: Wylee Dorantes MRN: [id number] Date of Birth: 1975-10-28  Transition of Care [facility]) CM/SW Contact  [name], [name] Phone Number: 03/24/2024, 11:09 AM  Clinical Narrative:     [facility] is still reviewing referral. They are requesting updated clinicals. CSW emailed clinicals to [employer]  1320: notified by Jon that referral was declined; they cannot offer a bed. Unable to accept pt's [facility].  Pt may have more SNF options if weaned off vent though previously, SNF's were needing pt to have disability in place as well which is currently pending.  If pt is both off vent and approved for disability, SNF may be more likely.   [facility] cannot take pt's insurance. They recommended trying [facility].   CSW called [facility] [phone]) and left voicemail with admissions requesting return call. Referral faxed as well to [phone]   Expected Discharge Plan: Long Term Nursing Home Barriers to Discharge: Continued Medical Work up, SNF Pending Medicaid, SNF Pending bed offer, SNF Pending payor source - LOG, Inadequate or no insurance (New trach)               Expected Discharge Plan and Services In-house Referral: Clinical Social Work, Hospice / Palliative Care   Post Acute Care Choice: Skilled Nursing Facility Living arrangements for the past 2 months: Single Family Home                                       Social Drivers of Health (SDOH) Interventions SDOH Screenings   Food Insecurity: Patient Unable To Answer (10/29/2023)  Social Connections: Unknown (06/23/2023)   Received from [facility]  Tobacco Use: High Risk (10/31/2023)    Readmission Risk Interventions     No data to display

## 2024-03-24 NOTE — Progress Notes (Signed)
 Pt placed back on documented full support vent setting. Pt has increased WOB, RR in the 30's- 40's, diaphoretic, and tachycardic. Pt tolerating well.

## 2024-03-24 NOTE — Progress Notes (Signed)
 Nutrition Follow-up  DOCUMENTATION CODES:  Not applicable  INTERVENTION:  Continue bolus tube feeding via PEG: 5 cartons ( ) Jevity 1.5 daily 60ml water  flush before and after each bolus ( total) Provide additional free water  flushes 200ml q12h per CCM ( total) 60ml ProSource TF20 TID Provides 2015 kcal, 136g protein, total free water   Given medical stability and stable on tube feeding regimen, RD to follow up every 2 weeks unless alerted to any changes in status or new intolerance to tube feeding.   NUTRITION DIAGNOSIS:  Inadequate oral intake related to inability to eat as evidenced by NPO status. - remains applicable  GOAL:  Patient will meet greater than or equal to 90% of their needs - goal met via TF  MONITOR:  Labs, Weight trends, TF tolerance, Skin  REASON FOR ASSESSMENT:  Consult Enteral/tube feeding initiation and management  ASSESSMENT:  Pt with PMH of HTN, daily mariajuana, and smoker admitted after being found down at home with pontine hemorrhagic stroke and trace SAH. UDS positive for opioids. Noted aspiration PNA on admission.  3/08 - admitted with [facility], intubated 3/14 - s/p cortrak placement (tip gastric) 3/22 - s/p tracheostomy 3/24 - s/p PEG 4/20 - Cuff changed to cuffless 5/10 - PEG dislodged, TF's stopped  5/11 - PEG replaced, TF's restarted at goal  6/10 - trach removed 7/07- desat, transferred back to 4N ICU, re-intubated  7/16 - s/p trach placement   Patient continues to increase time on trach collar. Tolerated 18 hours yesterday with goal of 24 hours today.   Awaiting financial review for bed offer to [facility].  No notable changes in status.   Patient remains stable on current tube feeding regimen.  No new skin issues. Having regular bowel movements.   Medications: colace BID, MVI, miralax  BID  Labs: reviewed  Diet Order:   Diet Order             Diet NPO time specified  Diet effective midnight                    EDUCATION NEEDS:   No education needs have been identified at this time  Skin:  Skin Assessment: Reviewed RN Assessment Other: Non pressure wound, left eye  Last BM:  8/3 x2 (type 7 large; type 6 small)  Height:   Ht Readings from Last 1 Encounters:  03/19/24 5' 8 (1.727 m)    Weight:   Wt Readings from Last 1 Encounters:  03/23/24 92 kg    Ideal Body Weight:  70 kg  BMI:  Body mass index is 30.84 kg/m.  Estimated Nutritional Needs:   Kcal:  2000-2200  Protein:  125-140 grams  Fluid:  >2 L/day  Royce Maris, RDN, LDN Clinical Nutrition See AMiON for contact information.

## 2024-03-24 NOTE — Progress Notes (Signed)
 NP made note to place pt back on full support vent setting after being on ATC for 18hrs. Pt placed back documented full support vent settings at this time. Pt tolerating well.

## 2024-03-24 NOTE — Progress Notes (Signed)
 NAME:  Garrett [name], MRN:  [id number], DOB:  12-18-1975, LOS: 149 ADMISSION DATE:  10/27/2023, CONSULTATION DATE:  10/27/2023 REFERRING MD: Regalado - TRH, CHIEF COMPLAINT:  Found down   History of Present Illness:  48 y/o man who was found down at home. PMHx significant for HTN.  Patient was down for an unknown amount of time, found by his fiance when she came home from work, reportedly with agonal breathing. Patient had driven her to work the morning of admission.  He has HTN but never checks his BP and does not follow with MD. Patient's fiance states that she can tell when his BP is really high; his eyes get blood shot and he is sweating. No h/o seizures. Besides almost daily marijuana and cigarette smoking, she is not aware of any other drug use. Drug screen positive for opioids and he was given several doses of Narcan . 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 BP in ED 262/156. CXR revealed RUL and perihilar infiltrates suggesting aspiration pneumonia. ED labs also revealed PH 7.169, LA 4.4, CPK 985. Patient was intubated in ED.  PCCM consulted.  Pertinent Medical History:  Hypertension  Significant Hospital Events: Including procedures, antibiotic start and stop dates in addition to other pertinent events   3/08 - Intubated in ED, pontine bleed/SAH. CT head 17:09 Acute large hemorrhage 4.1 cm in pons with intraventricular extension without hydrocephalus 3/09 - CTA 03:14 AM No change in IPH in pons, similar biparietal [facility] 3/14 - Now awake, appears locked in can follow commands with eyes. 3/16 - Purposeful with left upper extremity  3/22 - Tracheostomy placed at bedside, bleeding issues overnight from trach site 3/24 - PEG (Dr. Sebastian) 3/24 - Sputum culture with MRSA 3/27  - Vomited. TF on hold. Abd film c/w ileus. Added reglan , got SSE. Did tolerate PSV all day  3/28 - BMs x2 after SSE day prior. Added back TFs at 1/2 rated. Tolerated PS 3/29 - Tolerated PS  3/31 - Tolerating CPAP PS 15/5, TF on hold due to ileus. 4/01 - Con't to hold tube feeds today, restarting vancomycin  and sending tracheal aspirate as peaks are 37, plat 24, driving 19. Thick secretions. Fever overnight.  4/02 - Peaks improved. Ongoing hiccups. Trickle feeding. Neuro exam unchanged.  4/20 - Trach was changed to cuffless #8 4/28 - Not safe to swallow. Limited communication and he follows simple commands. On TF 5/01 - On TC 28%, with large secretions. Working with speech and he is improving to initiate PMV trials under ST supervision   5/05 - On TC 30%, 7 L/min. Trach #6 cuffless, changed 5/1 to facilitate PMV trials  5/12 - On TC 21%, 6 L/min. Trach #6 cuffless, unable to produce sounds or speak on PMV. No significant resp secretions. On PEG TF 6/10 - Tracheostomy was removed due to failure to well provide adequate airway 02/15/2024 pulmonary critical care consult. 7/07 Patient obtunded and desaturating on NRB; transfer to icu for intubation; updated mother over phone who wants to reverse code status to full code; ent consulted 7/09 Remains intubated on no continuous sedation, right eye open but does not track movement  7/16 Revision ENT trach with [facility] Flap 7/18 Weaned on vent 5 hours  7/19 Tolerated trach collar for the majority of the day but had some increased work of breath  resulting in being placed back on vent support  7/20 No issues overnight, back on ATC this AM 7/21 Did not tolerate ATC. Placed back on PRVC. 7/24 ATC until afternoon and went on vent for WOB reportedly  7/25 ATC for a few hours  7/26 ATC x 6h 7/28 tolerating trach collar-tolerated for most of the day 7/30 on vent overnight and back on trach collar today 8/1 back on vent overnight  8/2 was able to maintain off of  ventilator overnight but by 6 AM today patient was seen with signs of increased work of breathing including tachycardia therefore patient was placed back on ventilator 8/3 remained on ventilator overnight to transition to trach collar this a.m. 8/4 did 18 hour ATC yesterday; back on vent 3am today  Interim History / Subjective:   18 hours atc well yesterday back on vent 3 am today  Objective:   Blood pressure 98/78, pulse 87, temperature 99.1 F (37.3 C), temperature source Axillary, resp. rate 20, height 5' 8 (1.727 m), weight 92 kg, SpO2 100%.    Vent Mode: PRVC FiO2 (%):  [40 %] 40 % Set Rate:  [20 bmp] 20 bmp Vt Set:  [540 mL] 540 mL PEEP:  [5 cmH20] 5 cmH20 Pressure Support:  [10 cmH20] 10 cmH20 Plateau Pressure:  [20 cmH20] 20 cmH20   Intake/Output Summary (Last 24 hours) at 03/24/2024 0720 Last data filed at 03/24/2024 0100 Gross per 24 hour  Intake 877 ml  Output 1425 ml  Net -548 ml   Filed Weights   03/21/24 0407 03/22/24 0500 03/23/24 0651  Weight: 97 kg 92 kg 92 kg   Physical Examination: General: chronically ill appearing on mech vent HEENT: MM pink/moist; trach in place Neuro: tracks w/ eyes CV: s1s2, RRR, no m/r/g PULM:  dim rhonchi BS bilaterally; trached on mech vent PRVC GI: soft, bsx4 active  Extremities: warm/dry, no edema  Skin: no rashes or lesions   Resolved Problem List:  Post sedation hypotension Left eye conjunctivitis - resolved  Hyperglycemia, resolved  Assessment and Plan:   Acute on chronic respiratory failure in the setting of brain bleed Status post redo tracheostomy History of MRSA pneumonia, Klebsiella pneumonia, recurrent aspiration pneumonia Tracheostomy 3/22-6/10, reintubated 7/7, ENT tracheostomy 7/16 P: -will attempt 24 ATC today; goal eventually 48 hours then can do continuous ATC -rest on PRVC LTVV strategy with tidal volumes of 6-8 cc/kg ideal body weight -trach care per protocol -Wean PEEP/FiO2 for SpO2 >92% -VAP bundle  in place -pulm toiletry: metaneb  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 Coli - Discussions was had regarding suprapubic catheter with urology 7/9 but Family not in agreement at present P: -Continue Foley; monitor UOP -Foley exchange planned 8/12  Hypertension  P: -Continue metoprolol  and amlodipine   Nutrition P: -Continue tube feeds via PEG per RD  Anemia Thrombocytosis P: -Trend CBC  Dispo -insurance has been a barrier, TOC is following and seems we are still awaiting medicaid.  Plan continues to be that we can liberate him from the vent which would make this probably easier  Best Practice (right click and Reselect all SmartList Selections daily)   Diet/type: tubefeeds and NPO w/ meds via tube DVT prophylaxis: LMWH GI prophylaxis: H2B Lines: N/A and yes and it is still needed RUE DL PICC 7/7 > Foley:  Yes, and it is still needed Code Status:  full code Last date  of multidisciplinary goals of care discussion 7/8  CRITICAL CARE Performed by: Norleen JONETTA Cedar   Total critical care time: 35 minutes  JD Cedar RIGGERS [facility] 03/24/2024, 8:51 AM  Please see [email or web address] for pager details.  From 7A-7P if no response, please call [phone]. After hours, please call ELink [phone].

## 2024-03-25 ENCOUNTER — Inpatient Hospital Stay (HOSPITAL_COMMUNITY)

## 2024-03-25 DIAGNOSIS — I613 Nontraumatic intracerebral hemorrhage in brain stem: Secondary | ICD-10-CM | POA: Diagnosis not present

## 2024-03-25 DIAGNOSIS — G935 Compression of brain: Secondary | ICD-10-CM | POA: Diagnosis not present

## 2024-03-25 DIAGNOSIS — Z930 Tracheostomy status: Secondary | ICD-10-CM | POA: Diagnosis not present

## 2024-03-25 DIAGNOSIS — J9620 Acute and chronic respiratory failure, unspecified whether with hypoxia or hypercapnia: Secondary | ICD-10-CM | POA: Diagnosis not present

## 2024-03-25 LAB — GLUCOSE, CAPILLARY
Glucose-Capillary: 143 mg/dL — ABNORMAL HIGH (ref 70–99)
Glucose-Capillary: 145 mg/dL — ABNORMAL HIGH (ref 70–99)
Glucose-Capillary: 120 mg/dL — ABNORMAL HIGH (ref 70–99)

## 2024-03-25 LAB — MRSA NEXT GEN BY PCR, NASAL: MRSA by PCR Next Gen: DETECTED — AB

## 2024-03-25 MED ORDER — METOPROLOL TARTRATE 50 MG PO TABS
75.0000 mg | ORAL_TABLET | Freq: Two times a day (BID) | ORAL | Status: DC
  Administered 2024-03-25 – 2024-06-21 (×179): 75 mg
  Filled 2024-03-25 (×65): qty 1
  Filled 2024-03-25: qty 2
  Filled 2024-03-25 (×113): qty 1

## 2024-03-25 MED ORDER — METOPROLOL TARTRATE 25 MG PO TABS
25.0000 mg | ORAL_TABLET | Freq: Once | ORAL | Status: AC
  Administered 2024-03-25: 25 mg
  Filled 2024-03-25: qty 1

## 2024-03-25 MED ORDER — CHLORHEXIDINE GLUCONATE CLOTH 2 % EX PADS
6.0000 | MEDICATED_PAD | Freq: Every day | CUTANEOUS | Status: AC
  Administered 2024-03-26 – 2024-03-27 (×2): 6 via TOPICAL

## 2024-03-25 MED ORDER — MUPIROCIN 2 % EX OINT
1.0000 | TOPICAL_OINTMENT | Freq: Two times a day (BID) | CUTANEOUS | Status: AC
  Administered 2024-03-25 – 2024-03-30 (×10): 1 via NASAL
  Filled 2024-03-25 (×3): qty 22

## 2024-03-25 NOTE — Progress Notes (Signed)
 Physical Therapy Treatment Patient Details Name: Garrett [name] MRN: [id number] DOB: [birth year] Today's Date: 03/25/2024   History of Present Illness Pt is a 48 yo male who was found down 10/27/23 with agonal breathing. 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 Intubated 3/8, cortrak placed 3/14, trach placed 3/22, PEG placed 3/24. Reintubated 02/25/2024 and trach 7/16.  PMH: HTN, smoker    PT Comments  Pt was minimally participative today.  He could follow simple commands like look up, look down, but could not reliably use the movements to communicate his wishes.  Emphasis on extensive PROM from neck to feet, then transitioned pt from supine from L to R up via R side to sitting.  Without facilitation/assist, pt's trunk/neck was flexed forward.  With +2 total assist, pt was assisted into upright posture in cervical neutral for about 8 min total.  Again the cues were ambiguous on whether he was okay sitting, but after about 8 min, pt appeared uncomfortable and he was assisted to supine.  BP during this time was stable during this time.     If plan is discharge home, recommend the following: Two people to help with walking and/or transfers;Two people to help with bathing/dressing/bathroom;Assistance with cooking/housework;Assistance with feeding;Direct supervision/assist for medications management;Direct supervision/assist for financial management;Assist for transportation;Help with stairs or ramp for entrance;Supervision due to cognitive status   Can travel by private vehicle        Equipment Recommendations  [place] lift;Hospital bed;Wheelchair (measurements PT);Wheelchair cushion (measurements PT);BSC/3in1;Other (comment)    Recommendations for Other Services       Precautions / Restrictions Precautions Precautions: Fall Recall  of Precautions/Restrictions: Impaired Precaution/Restrictions Comments: PEG, trach SBP < 160 Splint/Cast: Bil resting hand splints Restrictions Weight Bearing Restrictions Per Provider Order: No     Mobility  Bed Mobility Overal bed mobility: Needs Assistance Bed Mobility: Supine to Sit, Sit to Supine, Rolling     Supine to sit: +2 for physical assistance Sit to supine: Total assist, +2 for physical assistance   General bed mobility comments: Initiated bed into chair position, once torso elevated to about 45 degrees, assisted pt forward and pivoted with padding to come to EOB with +2 total assist    Transfers                   General transfer comment: pt would not be able to participate with transfers at this time.    Ambulation/Gait                   Stairs             Wheelchair Mobility     Tilt Bed    Modified Rankin (Stroke Patients Only) Modified Rankin (Stroke Patients Only) Pre-Morbid Rankin Score: No symptoms Modified Rankin: Severe disability     Balance Overall balance assessment: Needs assistance Sitting-balance support: Feet unsupported, No upper extremity supported Sitting balance-Leahy Scale: Zero Sitting balance - Comments: pt is total A +2 for bringing torso off bed to  dangling EOB.  Tech assisting from behind and therapist assisting from the front, total assist to support head in neutral cervical extention. ,  Sat EOB x 8 min, pt finally following simple commands to look up, look down, but unable to reliably relate yes or no's to cues. Postural control: Posterior lean  Communication Communication Communication: Impaired Factors Affecting Communication: Trach/intubated  Cognition Arousal: Alert Behavior During Therapy: Flat affect   PT - Cognitive impairments: Difficult to assess Difficult to assess due to: Impaired communication                     PT - Cognition  Comments: eyes open intermittently in session, fixates more with R eye, L with more errythema and draining covered with gauze today; focused attention as eyes close quickly or deviate down when not stimulated, is able to respond with a thumbs up inconsistently Following commands: Impaired Following commands impaired: Follows one step commands inconsistently    Cueing Cueing Techniques: Verbal cues, Tactile cues, Visual cues, Gestural cues  Exercises General Exercises - Lower Extremity Ankle Circles/Pumps: PROM, Supine, Both, 10 reps Other Exercises Other Exercises: PROM to hips, knees ankles bil, with heel cord stretch Other Exercises: PROM to shd in flex/abd/add, elbow full range and wrist.    General Comments        Pertinent Vitals/Pain Pain Assessment Pain Assessment: Faces Faces Pain Scale: No hurt Pain Location: slight grimace with LE stretching Pain Descriptors / Indicators: Grimacing Pain Intervention(s): Limited activity within patient's tolerance, Monitored during session    Home Living                          Prior Function            PT Goals (current goals can now be found in the care plan section) Acute Rehab PT Goals PT Goal Formulation: Patient unable to participate in goal setting Time For Goal Achievement: 03/26/24 Potential to Achieve Goals: Poor Progress towards PT goals: Not progressing toward goals - comment    Frequency    Min 1X/week      PT Plan      Co-evaluation PT/OT/SLP Co-Evaluation/Treatment: Yes Reason for Co-Treatment: Complexity of the patient's impairments (multi-system involvement);Necessary to address cognition/behavior during functional activity;For patient/therapist safety PT goals addressed during session: Mobility/safety with mobility;Balance;Strengthening/ROM        AM-PAC PT 6 Clicks Mobility   Outcome Measure  Help needed turning from your back to your side while in a flat bed without using bedrails?:  Total Help needed moving from lying on your back to sitting on the side of a flat bed without using bedrails?: Total Help needed moving to and from a bed to a chair (including a wheelchair)?: Total Help needed standing up from a chair using your arms (e.g., wheelchair or bedside chair)?: Total Help needed to walk in hospital room?: Total Help needed climbing 3-5 steps with a railing? : Total 6 Click Score: 6    End of Session Equipment Utilized During Treatment: Oxygen Activity Tolerance: Patient tolerated treatment well Patient left: in bed Nurse Communication: Mobility status PT Visit Diagnosis: Muscle weakness (generalized) (M62.81);Other symptoms and signs involving the nervous system (R29.898);Hemiplegia and hemiparesis Hemiplegia - Right/Left: Right Hemiplegia - dominant/non-dominant: Dominant Hemiplegia - caused by: Nontraumatic intracerebral hemorrhage     Time: 1440-1509 PT Time Calculation (min) (ACUTE ONLY): 29 min  Charges:    $Therapeutic Exercise: 8-22 mins $Therapeutic Activity: 8-22 mins PT General Charges $$ ACUTE PT VISIT: 1 Visit                     03/25/2024  [place] HERO., PT Acute Rehabilitation Services [phone]  (office)   Vinie GAILS Wynnie Pacetti 03/25/2024, 6:30 PM

## 2024-03-25 NOTE — Progress Notes (Signed)
 eLink Physician-Brief Progress Note Patient Name: Garrett [name] DOB: 04-10-1976 MRN: [id number]   Date of Service  03/25/2024  HPI/Events of Note  Patient had limited IV access.  PICC was removed.  No access.  eICU Interventions  IV team consultation for PIV placement     Intervention Category Minor Interventions: Routine modifications to care plan (e.g. PRN medications for pain, fever)  Keyanni Whittinghill 03/25/2024, 7:42 PM

## 2024-03-25 NOTE — Plan of Care (Signed)
  Problem: Nutrition: Goal: Dietary intake will improve Outcome: Progressing   Problem: Fluid Volume: Goal: Ability to maintain a balanced intake and output will improve Outcome: Progressing   Problem: Skin Integrity: Goal: Risk for impaired skin integrity will decrease Outcome: Progressing   Problem: Tissue Perfusion: Goal: Adequacy of tissue perfusion will improve Outcome: Progressing   Problem: Clinical Measurements: Goal: Will remain free from infection Outcome: Progressing   Problem: Education: Goal: Knowledge of disease or condition will improve Outcome: Progressing   Problem: Nutrition: Goal: Risk of aspiration will decrease Outcome: Progressing

## 2024-03-25 NOTE — TOC Progression Note (Signed)
 Transition of Care [facility]) - Progression Note    Patient Details  Name: Garrett [name] MRN: [id number] Date of Birth: [birth year]  Transition of Care [facility]) CM/SW Contact  [name], [name] Phone Number: 03/25/2024, 10:52 AM  Clinical Narrative:     [facility] cannot offer a bed.   Expected Discharge Plan: Long Term Nursing Home Barriers to Discharge: Continued Medical Work up, SNF Pending Medicaid, SNF Pending bed offer, SNF Pending payor source - LOG, Inadequate or no insurance (New trach)               Expected Discharge Plan and Services In-house Referral: Clinical Social Work, Hospice / Palliative Care   Post Acute Care Choice: Skilled Nursing Facility Living arrangements for the past 2 months: Single Family Home                                       Social Drivers of Health (SDOH) Interventions SDOH Screenings   Food Insecurity: Patient Unable To Answer (10/29/2023)  Social Connections: Unknown (06/23/2023)   Received from [facility]  Tobacco Use: High Risk (10/31/2023)    Readmission Risk Interventions     No data to display

## 2024-03-25 NOTE — Progress Notes (Signed)
 Per Dr. Theodoro, PICC line to be removed and no PIV needed at this time. Verified that we have no IV access, MD aware.

## 2024-03-25 NOTE — Progress Notes (Signed)
 NAME:  Garrett [name], MRN:  [id number], DOB:  09-Jun-1976, LOS: 150 ADMISSION DATE:  10/27/2023, CONSULTATION DATE:  10/27/2023 REFERRING MD: Madelyne - TRH, CHIEF COMPLAINT:  Found down   History of Present Illness:  48 y/o man who was found down at home. PMHx significant for HTN.  Patient was down for an unknown amount of time, found by his fiance when she came home from work, reportedly with agonal breathing. Patient had driven her to work the morning of admission.  He has HTN but never checks his BP and does not follow with MD. Patient's fiance states that she can tell when his BP is really high; his eyes get blood shot and he is sweating. No h/o seizures. Besides almost daily marijuana and cigarette smoking, she is not aware of any other drug use. Drug screen positive for opioids and he was given several doses of Narcan . 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 BP in ED 262/156. CXR revealed RUL and perihilar infiltrates suggesting aspiration pneumonia. ED labs also revealed PH 7.169, LA 4.4, CPK 985. Patient was intubated in ED.  PCCM consulted.  Pertinent Medical History:  Hypertension  Significant Hospital Events: Including procedures, antibiotic start and stop dates in addition to other pertinent events   3/08 - Intubated in ED, pontine bleed/SAH. CT head 17:09 Acute large hemorrhage 4.1 cm in pons with intraventricular extension without hydrocephalus 3/09 - CTA 03:14 AM No change in IPH in pons, similar biparietal [facility] 3/14 - Now awake, appears locked in can follow commands with eyes. 3/16 - Purposeful with left upper extremity  3/22 - Tracheostomy placed at bedside, bleeding issues overnight from trach site 3/24 - PEG (Dr. Sebastian) 3/24 - Sputum culture with MRSA 3/27  - Vomited. TF on hold. Abd film c/w ileus. Added reglan , got SSE. Did tolerate PSV all day  3/28 - BMs x2 after SSE day prior. Added back TFs at 1/2 rated. Tolerated PS 3/29 - Tolerated PS  3/31 - Tolerating CPAP PS 15/5, TF on hold due to ileus. 4/01 - Con't to hold tube feeds today, restarting vancomycin  and sending tracheal aspirate as peaks are 37, plat 24, driving 19. Thick secretions. Fever overnight.  4/02 - Peaks improved. Ongoing hiccups. Trickle feeding. Neuro exam unchanged.  4/20 - Trach was changed to cuffless #8 4/28 - Not safe to swallow. Limited communication and he follows simple commands. On TF 5/01 - On TC 28%, with large secretions. Working with speech and he is improving to initiate PMV trials under ST supervision   5/05 - On TC 30%, 7 L/min. Trach #6 cuffless, changed 5/1 to facilitate PMV trials  5/12 - On TC 21%, 6 L/min. Trach #6 cuffless, unable to produce sounds or speak on PMV. No significant resp secretions. On PEG TF 6/10 - Tracheostomy was removed due to failure to well provide adequate airway 02/15/2024 pulmonary critical care consult. 7/07 Patient obtunded and desaturating on NRB; transfer to icu for intubation; updated mother over phone who wants to reverse code status to full code; ent consulted 7/09 Remains intubated on no continuous sedation, right eye open but does not track movement  7/16 Revision ENT trach with [facility] Flap 7/18 Weaned on vent 5 hours  7/19 Tolerated trach collar for the majority of the day but had some increased work of breath  resulting in being placed back on vent support  7/20 No issues overnight, back on ATC this AM 7/21 Did not tolerate ATC. Placed back on PRVC. 7/24 ATC until afternoon and went on vent for WOB reportedly  7/25 ATC for a few hours  7/26 ATC x 6h 7/28 tolerating trach collar-tolerated for most of the day 7/30 on vent overnight and back on trach collar today 8/1 back on vent overnight  8/2 was able to maintain off of  ventilator overnight but by 6 AM today patient was seen with signs of increased work of breathing including tachycardia therefore patient was placed back on ventilator 8/3 remained on ventilator overnight to transition to trach collar this a.m. 8/4 did 18 hour ATC yesterday; back on vent 3am today 8/5 TC for 12-14 hours. Then went back to vent because of tachypnea.   Interim History / Subjective:   Now on TC. No other overnight events.   Objective:   Blood pressure (!) 127/90, pulse 85, temperature 98.2 F (36.8 C), temperature source Axillary, resp. rate (!) 29, height 5' 8 (1.727 m), weight 92 kg, SpO2 97%.    Vent Mode: PRVC FiO2 (%):  [30 %-40 %] 30 % Set Rate:  [20 bmp] 20 bmp Vt Set:  [540 mL] 540 mL PEEP:  [5 cmH20] 5 cmH20 Plateau Pressure:  [18 cmH20] 18 cmH20   Intake/Output Summary (Last 24 hours) at 03/25/2024 1142 Last data filed at 03/25/2024 1117 Gross per 24 hour  Intake 2034 ml  Output 1575 ml  Net 459 ml   Filed Weights   03/21/24 0407 03/22/24 0500 03/23/24 0651  Weight: 97 kg 92 kg 92 kg   Physical Examination: General: chronically ill appearing on mech vent HEENT: MM pink/moist; trach in place Neuro: tracks w/ eyes CV: s1s2, RRR, no m/r/g PULM:  dim rhonchi BS bilaterally; trached on mech vent PRVC GI: soft, bsx4 active  Extremities: warm/dry, no edema  Skin: no rashes or lesions   Resolved Problem List:  Post sedation hypotension Left eye conjunctivitis - resolved  Hyperglycemia, resolved  Assessment and Plan:   Acute on chronic respiratory failure in the setting of brain bleed Status post redo tracheostomy History of MRSA pneumonia, Klebsiella pneumonia, recurrent aspiration pneumonia Tracheostomy 3/22-6/10, reintubated 7/7, ENT tracheostomy 7/16 P: -will attempt 24 ATC today; goal eventually 48 hours then can do continuous ATC -rest on PRVC LTVV strategy with tidal volumes of 6-8 cc/kg ideal body weight -trach care per protocol -Wean  PEEP/FiO2 for SpO2 >92% -VAP bundle in place -pulm toiletry: metaneb  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 Coli - Discussions was had regarding suprapubic catheter with urology 7/9 but Family not in agreement at present P: -Continue Foley; monitor UOP -Foley exchange planned 8/12  Hypertension  P: -Continue metoprolol  and amlodipine   Nutrition P: -Continue tube feeds via PEG per RD  Anemia Thrombocytosis P: -Trend CBC  Dispo -insurance has been a barrier, TOC is following and seems we are still awaiting medicaid.  Plan continues to be that we can liberate him from the vent which would make this probably easier  Best Practice (right click and Reselect all SmartList Selections daily)   Diet/type: tubefeeds and NPO w/ meds via tube DVT prophylaxis: LMWH GI prophylaxis: H2B Lines: will remove PICC line today.  Foley:  Yes, and it is still needed Code Status:  full code Last date  of multidisciplinary goals of care discussion 7/8  CRITICAL CARE Performed by: Sammi JONETTA Fredericks.     Total critical care time: 31 minutes   Critical care time was exclusive of separately billable procedures and treating other patients.   Critical care was necessary to treat or prevent imminent or life-threatening deterioration.   Critical care was time spent personally by me on the following activities: development of treatment plan with patient and/or surrogate as well as nursing, discussions with consultants, evaluation of patient's response to treatment, examination of patient, obtaining history from patient or surrogate, ordering and performing treatments and interventions, ordering and review of laboratory studies, ordering and review of radiographic studies, pulse oximetry, re-evaluation of patient's condition and participation in  multidisciplinary rounds.   Sammi JONETTA Fredericks, MD Pulmonary, Critical Care and Sleep Attending.  Pager: [phone]

## 2024-03-26 DIAGNOSIS — I619 Nontraumatic intracerebral hemorrhage, unspecified: Secondary | ICD-10-CM | POA: Diagnosis not present

## 2024-03-26 DIAGNOSIS — J9620 Acute and chronic respiratory failure, unspecified whether with hypoxia or hypercapnia: Secondary | ICD-10-CM | POA: Diagnosis not present

## 2024-03-26 DIAGNOSIS — Z930 Tracheostomy status: Secondary | ICD-10-CM | POA: Diagnosis not present

## 2024-03-26 LAB — CBC WITH DIFFERENTIAL/PLATELET
Abs Immature Granulocytes: 0.02 K/uL (ref 0.00–0.07)
Basophils Absolute: 0 K/uL (ref 0.0–0.1)
Basophils Relative: 0 %
Eosinophils Absolute: 0.1 K/uL (ref 0.0–0.5)
Eosinophils Relative: 2 %
HCT: 34.2 % — ABNORMAL LOW (ref 39.0–52.0)
Hemoglobin: 10.1 g/dL — ABNORMAL LOW (ref 13.0–17.0)
Immature Granulocytes: 0 %
Lymphocytes Relative: 22 %
Lymphs Abs: 1.9 K/uL (ref 0.7–4.0)
MCH: 27.1 pg (ref 26.0–34.0)
MCHC: 29.5 g/dL — ABNORMAL LOW (ref 30.0–36.0)
MCV: 91.7 fL (ref 80.0–100.0)
Monocytes Absolute: 0.7 K/uL (ref 0.1–1.0)
Monocytes Relative: 8 %
Neutro Abs: 6 K/uL (ref 1.7–7.7)
Neutrophils Relative %: 68 %
Platelets: 407 K/uL — ABNORMAL HIGH (ref 150–400)
RBC: 3.73 MIL/uL — ABNORMAL LOW (ref 4.22–5.81)
RDW: 15.9 % — ABNORMAL HIGH (ref 11.5–15.5)
WBC: 8.7 K/uL (ref 4.0–10.5)
nRBC: 0 % (ref 0.0–0.2)

## 2024-03-26 LAB — BASIC METABOLIC PANEL WITH GFR
Anion gap: 9 (ref 5–15)
BUN: 22 mg/dL — ABNORMAL HIGH (ref 6–20)
CO2: 28 mmol/L (ref 22–32)
Calcium: 9.4 mg/dL (ref 8.9–10.3)
Chloride: 103 mmol/L (ref 98–111)
Creatinine, Ser: 0.47 mg/dL — ABNORMAL LOW (ref 0.61–1.24)
GFR, Estimated: >60 mL/min (ref >60)
Glucose, Bld: 138 mg/dL — ABNORMAL HIGH (ref 70–99)
Potassium: 3.8 mmol/L (ref 3.5–5.1)
Sodium: 140 mmol/L (ref 135–145)

## 2024-03-26 LAB — GLUCOSE, CAPILLARY
Glucose-Capillary: 145 mg/dL — ABNORMAL HIGH (ref 70–99)
Glucose-Capillary: 189 mg/dL — ABNORMAL HIGH (ref 70–99)
Glucose-Capillary: 127 mg/dL — ABNORMAL HIGH (ref 70–99)
Glucose-Capillary: 123 mg/dL — ABNORMAL HIGH (ref 70–99)

## 2024-03-26 NOTE — Plan of Care (Signed)
  Problem: Nutrition: Goal: Risk of aspiration will decrease Outcome: Progressing Goal: Dietary intake will improve Outcome: Progressing   Problem: Fluid Volume: Goal: Ability to maintain a balanced intake and output will improve Outcome: Progressing   Problem: Skin Integrity: Goal: Risk for impaired skin integrity will decrease Outcome: Progressing   Problem: Tissue Perfusion: Goal: Adequacy of tissue perfusion will improve Outcome: Progressing

## 2024-03-26 NOTE — Progress Notes (Signed)
 NAME:  Jishnu Jenniges, MRN:  [id number], DOB:  1976-04-27, LOS: 151 ADMISSION DATE:  10/27/2023, CONSULTATION DATE:  10/27/2023 REFERRING MD: Regalado - TRH, CHIEF COMPLAINT:  Found down   History of Present Illness:  48 y/o man who was found down at home. PMHx significant for HTN.  Patient was down for an unknown amount of time, found by his fiance when she came home from work, reportedly with agonal breathing. Patient had driven her to work the morning of admission.  He has HTN but never checks his BP and does not follow with MD. Patient's fiance states that she can tell when his BP is really high; his eyes get blood shot and he is sweating. No h/o seizures. Besides almost daily marijuana and cigarette smoking, she is not aware of any other drug use. Drug screen positive for opioids and he was given several doses of Narcan . 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 BP in ED 262/156. CXR revealed RUL and perihilar infiltrates suggesting aspiration pneumonia. ED labs also revealed PH 7.169, LA 4.4, CPK 985. Patient was intubated in ED.  PCCM consulted.  Pertinent Medical History:  Hypertension  Significant Hospital Events: Including procedures, antibiotic start and stop dates in addition to other pertinent events   3/08 - Intubated in ED, pontine bleed/SAH. CT head 17:09 Acute large hemorrhage 4.1 cm in pons with intraventricular extension without hydrocephalus 3/09 - CTA 03:14 AM No change in IPH in pons, similar biparietal [facility] 3/14 - Now awake, appears locked in can follow commands with eyes. 3/16 - Purposeful with left upper extremity  3/22 - Tracheostomy placed at bedside, bleeding issues overnight from trach site 3/24 - PEG (Dr. Sebastian) 3/24 - Sputum culture with MRSA 3/27  - Vomited. TF on hold. Abd film c/w ileus. Added reglan , got SSE. Did tolerate PSV all day  3/28 - BMs x2 after SSE day prior. Added back TFs at 1/2 rated. Tolerated PS 3/29 - Tolerated PS  3/31 - Tolerating CPAP PS 15/5, TF on hold due to ileus. 4/01 - Con't to hold tube feeds today, restarting vancomycin  and sending tracheal aspirate as peaks are 37, plat 24, driving 19. Thick secretions. Fever overnight.  4/02 - Peaks improved. Ongoing hiccups. Trickle feeding. Neuro exam unchanged.  4/20 - Trach was changed to cuffless #8 4/28 - Not safe to swallow. Limited communication and he follows simple commands. On TF 5/01 - On TC 28%, with large secretions. Working with speech and he is improving to initiate PMV trials under ST supervision   5/05 - On TC 30%, 7 L/min. Trach #6 cuffless, changed 5/1 to facilitate PMV trials  5/12 - On TC 21%, 6 L/min. Trach #6 cuffless, unable to produce sounds or speak on PMV. No significant resp secretions. On PEG TF 6/10 - Tracheostomy was removed due to failure to well provide adequate airway 02/15/2024 pulmonary critical care consult. 7/07 Patient obtunded and desaturating on NRB; transfer to icu for intubation; updated mother over phone who wants to reverse code status to full code; ent consulted 7/09 Remains intubated on no continuous sedation, right eye open but does not track movement  7/16 Revision ENT trach with [facility] Flap 7/18 Weaned on vent 5 hours  7/19 Tolerated trach collar for the majority of the day but had some increased work of breath  resulting in being placed back on vent support  7/20 No issues overnight, back on ATC this AM 7/21 Did not tolerate ATC. Placed back on PRVC. 7/24 ATC until afternoon and went on vent for WOB reportedly  7/25 ATC for a few hours  7/26 ATC x 6h 7/28 tolerating trach collar-tolerated for most of the day 7/30 on vent overnight and back on trach collar today 8/1 back on vent overnight  8/2 was able to maintain off of  ventilator overnight but by 6 AM today patient was seen with signs of increased work of breathing including tachycardia therefore patient was placed back on ventilator 8/3 remained on ventilator overnight to transition to trach collar this a.m. 8/4 did 18 hour ATC yesterday; back on vent 3am today 8/5 TC for 12-14 hours. Then went back to vent because of tachypnea.  8/6 tolerated ATC at 30% for 24 hours. Back on vent this AM for rest as was a bit tachypneic  Interim History / Subjective:  NAEON. Tolerated ATC at 30% for 24 hours but required placement back on vent this AM due to some tachypnea.  Objective:   Blood pressure (!) 134/93, pulse 85, temperature 98.6 F (37 C), temperature source Axillary, resp. rate (!) 22, height 5' 8 (1.727 m), weight 92 kg, SpO2 96%.    Vent Mode: PRVC FiO2 (%):  [28 %-40 %] 40 % Set Rate:  [20 bmp] 20 bmp Vt Set:  [540 mL] 540 mL PEEP:  [5 cmH20] 5 cmH20 Plateau Pressure:  [18 cmH20] 18 cmH20   Intake/Output Summary (Last 24 hours) at 03/26/2024 0840 Last data filed at 03/26/2024 0300 Gross per 24 hour  Intake 1000 ml  Output 990 ml  Net 10 ml   Filed Weights   03/21/24 0407 03/22/24 0500 03/23/24 0651  Weight: 97 kg 92 kg 92 kg   Physical Examination: General: chronically ill appearing male, on mech vent HEENT: MM pink/moist; trach in place C/D/I Neuro: tracks w/ eyes CV: s1s2, RRR, no m/r/g PULM:  dim rhonchi BS bilaterally; trached on mech vent PRVC GI: soft, bsx4 active  Extremities: warm/dry, no edema  Skin: no rashes or lesions   Resolved Problem List:  Post sedation hypotension Left eye conjunctivitis - resolved  Hyperglycemia, resolved  Assessment and Plan:   Acute on chronic respiratory failure in the setting of brain bleed Status post redo tracheostomy History of MRSA pneumonia, Klebsiella pneumonia, recurrent aspiration pneumonia Tracheostomy 3/22-6/10, reintubated 7/7, ENT tracheostomy 7/16 P: -cont ATC as tolerated, aim  for another 24 hours and progress as able. 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 Coli - Discussions was had regarding suprapubic catheter with urology 7/9 but Family not in agreement at present P: -Continue Foley; monitor UOP -Foley exchange planned 8/12  Hypertension  P: -Continue metoprolol  and amlodipine   Nutrition P: -Continue tube feeds via PEG per RD  Anemia Thrombocytosis P: -Trend CBC  Dispo -insurance has been a barrier, TOC is following and seems we are still awaiting medicaid.  Plan continues to be that we can liberate him from the vent which would make this probably easier   Will ask TRH to take over as primary. PCCM  to follow 1-2x weekly for trach needs and of course available in the interim as needed.   Best Practice (right click and Reselect all SmartList Selections daily)   Diet/type: tubefeeds and NPO w/ meds via tube DVT prophylaxis: LMWH GI prophylaxis: H2B Lines: none Foley:  Yes, and it is still needed Code Status:  full code Last date of multidisciplinary goals of care discussion 7/8    Sammi Gore, PA - [facility] For pager details, please see AMION or use Epic chat  After 1900, please call ELINK for cross coverage needs 03/26/2024, 8:45 AM

## 2024-03-27 DIAGNOSIS — I619 Nontraumatic intracerebral hemorrhage, unspecified: Secondary | ICD-10-CM | POA: Diagnosis not present

## 2024-03-27 LAB — GLUCOSE, CAPILLARY
Glucose-Capillary: 159 mg/dL — ABNORMAL HIGH (ref 70–99)
Glucose-Capillary: 167 mg/dL — ABNORMAL HIGH (ref 70–99)
Glucose-Capillary: 97 mg/dL (ref 70–99)

## 2024-03-27 NOTE — Plan of Care (Signed)
  Problem: Clinical Measurements: Goal: Cardiovascular complication will be avoided Outcome: Progressing   Problem: Clinical Measurements: Goal: Diagnostic test results will improve Outcome: Progressing   Problem: Clinical Measurements: Goal: Will remain free from infection Outcome: Progressing   Problem: Clinical Measurements: Goal: Ability to maintain clinical measurements within normal limits will improve Outcome: Progressing   Problem: Nutrition: Goal: Dietary intake will improve Outcome: Progressing   Problem: Self-Care: Goal: Ability to participate in self-care as condition permits will improve Outcome: Not Progressing   Problem: Health Behavior/Discharge Planning: Goal: Goals will be collaboratively established with patient/family Outcome: Progressing   Problem: Coping: Goal: Will verbalize positive feelings about self Outcome: Not Progressing

## 2024-03-27 NOTE — Progress Notes (Addendum)
 Progress Note   Patient: Garrett [name] FMW:[id number] DOB: 08-16-1976 DOA: 10/27/2023     152 DOS: the patient was seen and examined on 03/27/2024        Brief hospital course: 48 y.o. M with no prior MD follow up presented after being found down.  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  Intubated and admitted to neuro ICU.        Significant events: 3/8: Intubated in ED, pontine bleed/SAH. CT head Acute large hemorrhage 4.1 cm in pons with intraventricular extension without hydrocephalus. 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  Restarted antibiotics.  Urine culture + E. Coli, foley catheter changed 5/18: Completed antibiotics for UTI 5/22: Transferred [facility] 6/11: De-cannulated 01/29/2024 6/27: Placed on 3 L oxygen overnight, tachypnea, increased secretion. Started IV Unasyn . CCM re-consulted.  7/7: Transferred back to ICU; Re-intubated 7/8: ENT consulted for trach 7/16: Trach replaced by ENT 7/17-8/5: Slow vent weaning with low success 8/6: Transferred back to hospitalist service still on vent    Significant studies: 3/8 CT head on admission: 4.1 cm hemorrhage centered on pons with intraventricular extension into the 4th ventricle and basal cisterns and small [facility] 3/9 CTA head and neck: no change in IPH and [facility] 3/17 CT head: interval decrease in IPH 3/29 CT head: evolving unremarkably 5/10 CT abdomen: Foley catheter through  gastrostomy tract 5/15 US  abdomen: Unremarkable 7/19 CT head: Encephalomalacia at site of prior hemorrhage    Significant microbiology data: 4/1 trach aspirate: MRSA 4/8 trach aspirate: MRSA 5/13 foley catheter urine: E coli 5/13 blood cx x2: no growth 5/14 trach aspirate: MRSA 6/1 blood cx x2: no growth 6/2 trach aspirate: MRSA 6/27 sputum: MRSA, klebsiella 7/7 trach aspirate: MRSA, candida          Assessment and Plan: Pontine hemorrhage with IVH and brain compression Persistent vegetative state S/p Trach, PEG, indwelling foley Moderate protein calorie malnutrition - Continue tube feeds, free water  - Foley exchange due next week   Chronic respiratory failure due to persistent vegetative state Recurrent MRSA pneumonia S/p re-do tracheostomy 7/16 by ENT Dr. Tobie [name] replaced 7/16, has not yet been able to complete 24 hours off vent consistently. - Vent management per CCM   Hypertension BP controlled - Continue amlodipine , metoprolol   Normocytic anemia Stable, no bleeding    Resolved issues: Hypertensive emergency Recurrent aspiration pneumonia Klebsiella pneumonia Complicated E coli UTI          Subjective: Nonverbal.  No new nursing concerns. Low grade temp again yesterday .     Physical Exam: BP 123/87   Pulse 89   Temp 98.8 F (37.1 C)   Resp 18   Ht 5' 8 (1.727 m)   Wt 90 kg   SpO2 100%   BMI 30.17 kg/m   Adule male, trach and peg Right eye open, blinking, does not follow commands for me RRR no murmurs, no LE edema On oscillator, vent Abdomen withotu distension, no grimace or movemetn to palpation, soft  Data Reviewed: BMP unreamarkable yesterday CBC stable anemia  Discussed with CCM Dr. Pheobe   Family Communication:     Disposition: Status is: Inpatient         Author: Lonni [name] Dalton, MD 03/27/2024 10:26 AM  For on call review [email or web address].

## 2024-03-27 NOTE — Progress Notes (Signed)
 Pulmonary team will follow this patient for trach weaning. The patient will not be a candidate for decannulation. Discussed the plan with Dr Jonel.   The patient needs to be on trach collar continuously for 48 hours to be out of the ICU physically.  For now try to keep the patient on TC as long as the patient can tolerate during the day. Place back on full support at night. When consistently able to do this will plan for TC for 24 hours.   Pulm will continue to follow.

## 2024-03-27 NOTE — Progress Notes (Addendum)
 Occupational Therapy Treatment Patient Details Name: Garrett [name] MRN: [id number] DOB: 08/26/1975 Today's Date: 03/27/2024   History of present illness Pt is a 48 yo male who was found down 10/27/23 with agonal breathing. 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 Intubated 3/8, cortrak placed 3/14, trach placed 3/22, PEG placed 3/24. Reintubated 02/25/2024 and trach 7/16.  PMH: HTN, smoker   OT comments  Pt in bed, opens eyes to name but clearly fatigued. Raised HOB to increase alertness, with limited success. He closes R eye if not stating his name before a question/command.  He is able to give thumbs up on L hand accurately on 2/5 questions (hospital and birthday month), but requires repetition.  He is able to hold tape roll on L hand but is unable to release today, does not demonstrate hand squeeze today.  PROM to BUE provided, pt with slight initiation movement of elbow flexion but very inconsistent. On trach collar with VSS during session. Remains total assist for all ADLs.  Continues to work slowly towards goals.  Will follow acutely.       If plan is discharge home, recommend the following:  Other (comment) (total care)   Equipment Recommendations  Other (comment) (defer)    Recommendations for Other Services      Precautions / Restrictions Precautions Precautions: Fall Recall of Precautions/Restrictions: Impaired Precaution/Restrictions Comments: PEG, trach SBP < 160 Required Braces or Orthoses: Other Brace Splint/Cast: Bil resting hand splints Restrictions Weight Bearing Restrictions Per Provider Order: No       Mobility Bed Mobility               General bed mobility comments: deferred    Transfers                         Balance                                            ADL either performed or assessed with clinical judgement   ADL                                         General ADL Comments: total (A)    Extremity/Trunk Assessment              Vision   Vision Assessment?: Vision impaired- to be further tested in functional context Additional Comments: difficult to assess, pt lethragic and has bobbing of R eye with difficulty sustaining gaze.   Perception     Praxis     Communication Communication Communication: Impaired Factors Affecting Communication: Trach/intubated   Cognition Arousal: Lethargic Behavior During Therapy: Flat affect Cognition: Difficult to assess Difficult to assess due to: Tracheostomy           OT - Cognition Comments: trach.  Pt lethargic and intermittently follows simple commands with increased time using L UE.  He reponds better if his name is stated before command.                 Following commands: Impaired Following commands impaired: Follows one step commands inconsistently      Cueing  Cueing Techniques: Verbal cues, Tactile cues, Visual cues, Gestural cues  Exercises General Exercises - Upper Extremity Shoulder Flexion: PROM, Both, 5 reps Shoulder ABduction: PROM, Both, 5 reps, Seated Shoulder Horizontal ABduction: PROM, Both, 5 reps, Seated Shoulder Horizontal ADduction: PROM, Both, 5 reps, Seated Elbow Flexion: Both, PROM, 5 reps, Supine Elbow Extension: PROM, Both, 5 reps, Supine Wrist Flexion: Both, 10 reps, PROM Wrist Extension: PROM, Both, 10 reps Digit Composite Flexion: PROM, Both, 10 reps Composite Extension: PROM, Both, 10 reps    Shoulder Instructions       General Comments VSS on trach collar    Pertinent Vitals/ Pain       Pain Assessment Pain Assessment: Faces Faces Pain Scale: No hurt Pain Intervention(s): Monitored during session  Home Living                                          Prior Functioning/Environment               Frequency  Min 1X/week        Progress Toward Goals  OT Goals(current goals can now be found in the care plan section)  Progress towards OT goals: Not progressing toward goals - comment (limited by fatigue, re-assess next session)  Acute Rehab OT Goals Patient Stated Goal: unable OT Goal Formulation: Patient unable to participate in goal setting Time For Goal Achievement: 04/10/24 Potential to Achieve Goals: Fair ADL Goals Pt/caregiver will Perform Home Exercise Program: Left upper extremity;Increased strength Additional ADL Goal #1: Pt will follow 1 step commands with 50% accuracy in a non distracting enviroment. Additional ADL Goal #2: Pt will complete bed mobility with max assist +2 as precursor to ADLs. Additional ADL Goal #3: Pt willl use ball squeeze or thumbs up with L hand for accurate yes answer on 5/5 attempts.  Plan      Co-evaluation                 AM-PAC OT 6 Clicks Daily Activity     Outcome Measure   Help from another person eating meals?: Total Help from another person taking care of personal grooming?: Total Help from another person toileting, which includes using toliet, bedpan, or urinal?: Total Help from another person bathing (including washing, rinsing, drying)?: Total Help from another person to put on and taking off regular upper body clothing?: Total Help from another person to put on and taking off regular lower body clothing?: Total 6 Click Score: 6    End of Session Equipment Utilized During Treatment: Oxygen (via TC)  OT Visit Diagnosis: Unsteadiness on feet (R26.81);Muscle weakness (generalized) (M62.81) 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

## 2024-03-28 DIAGNOSIS — I619 Nontraumatic intracerebral hemorrhage, unspecified: Secondary | ICD-10-CM | POA: Diagnosis not present

## 2024-03-28 LAB — COMPREHENSIVE METABOLIC PANEL WITH GFR
ALT: 91 U/L — ABNORMAL HIGH (ref 0–44)
AST: 40 U/L (ref 15–41)
Albumin: 2.8 g/dL — ABNORMAL LOW (ref 3.5–5.0)
Alkaline Phosphatase: 57 U/L (ref 38–126)
Anion gap: 14 (ref 5–15)
BUN: 29 mg/dL — ABNORMAL HIGH (ref 6–20)
CO2: 25 mmol/L (ref 22–32)
Calcium: 9.3 mg/dL (ref 8.9–10.3)
Chloride: 105 mmol/L (ref 98–111)
Creatinine, Ser: 0.64 mg/dL (ref 0.61–1.24)
GFR, Estimated: >60 mL/min (ref >60)
Glucose, Bld: 99 mg/dL (ref 70–99)
Potassium: 4.1 mmol/L (ref 3.5–5.1)
Sodium: 144 mmol/L (ref 135–145)
Total Bilirubin: 0.4 mg/dL (ref 0.0–1.2)
Total Protein: 7.1 g/dL (ref 6.5–8.1)

## 2024-03-28 LAB — GLUCOSE, CAPILLARY
Glucose-Capillary: 112 mg/dL — ABNORMAL HIGH (ref 70–99)
Glucose-Capillary: 123 mg/dL — ABNORMAL HIGH (ref 70–99)
Glucose-Capillary: 136 mg/dL — ABNORMAL HIGH (ref 70–99)
Glucose-Capillary: 140 mg/dL — ABNORMAL HIGH (ref 70–99)

## 2024-03-28 LAB — CBC
HCT: 31.8 % — ABNORMAL LOW (ref 39.0–52.0)
Hemoglobin: 9.4 g/dL — ABNORMAL LOW (ref 13.0–17.0)
MCH: 27 pg (ref 26.0–34.0)
MCHC: 29.6 g/dL — ABNORMAL LOW (ref 30.0–36.0)
MCV: 91.4 fL (ref 80.0–100.0)
Platelets: 172 K/uL (ref 150–400)
RBC: 3.48 MIL/uL — ABNORMAL LOW (ref 4.22–5.81)
RDW: 15.9 % — ABNORMAL HIGH (ref 11.5–15.5)
WBC: 8.2 K/uL (ref 4.0–10.5)
nRBC: 0 % (ref 0.0–0.2)

## 2024-03-28 NOTE — Progress Notes (Signed)
  Progress Note   Patient: Garrett [name] FMW:[id number] DOB: November 10, 1975 DOA: 10/27/2023     153 DOS: the patient was seen and examined on 03/28/2024        Brief hospital course: 48 y.o. M with no prior MD follow up presented after being found down.  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  Intubated and admitted to neuro ICU.            Assessment and Plan: Pontine hemorrhage - Continue tube feeds, free water  - Chest PT - Air mattress - Foley exhcnage due next week  Respiratory failure - Vent mgmt per Pulmonology  HTN At goal - Continue amlodipine , metop  Anemia No change       Subjective: No change, no nursing concerns.     Physical Exam: BP 108/80   Pulse 64   Temp 98.1 F (36.7 C) (Axillary)   Resp 20   Ht 5' 8 (1.727 m)   Wt 88 kg   SpO2 100%   BMI 29.50 kg/m   Adule male, trach and peg Right eye open, blinking, does not follow commands for me RRR no murmurs, no LE edema On oscillator, vent Abdomen withotu distension, no grimace or movemetn to palpation, soft  Data Reviewed: BMP normal CBC mild anemia, essentially stable    Family Communication:     Disposition: Status is: Inpatient         Author: Lonni [name] Dalton, MD 03/28/2024 2:38 PM  For on call review [email or web address].

## 2024-03-28 NOTE — Plan of Care (Signed)
  Problem: Self-Care: Goal: Ability to communicate needs accurately will improve Outcome: Not Progressing   Problem: Nutrition: Goal: Dietary intake will improve Outcome: Progressing   Problem: Clinical Measurements: Goal: Will remain free from infection Outcome: Progressing Goal: Respiratory complications will improve Outcome: Progressing   Problem: Clinical Measurements: Goal: Respiratory complications will improve Outcome: Progressing

## 2024-03-29 DIAGNOSIS — I619 Nontraumatic intracerebral hemorrhage, unspecified: Secondary | ICD-10-CM | POA: Diagnosis not present

## 2024-03-29 LAB — GLUCOSE, CAPILLARY
Glucose-Capillary: 131 mg/dL — ABNORMAL HIGH (ref 70–99)
Glucose-Capillary: 139 mg/dL — ABNORMAL HIGH (ref 70–99)
Glucose-Capillary: 92 mg/dL (ref 70–99)

## 2024-03-29 NOTE — Plan of Care (Signed)
 Problem: Intracerebral Hemorrhage Tissue Perfusion: Goal: Complications of Intracerebral Hemorrhage will be minimized Outcome: Progressing   Problem: Nutrition: Goal: Risk of aspiration will decrease Outcome: Progressing Goal: Dietary intake will improve Outcome: Progressing   Problem: Fluid Volume: Goal: Ability to maintain a balanced intake and output will improve Outcome: Progressing   Problem: Skin Integrity: Goal: Risk for impaired skin integrity will decrease Outcome: Progressing   Problem: Tissue Perfusion: Goal: Adequacy of tissue perfusion will improve Outcome: Progressing   Problem: Clinical Measurements: Goal: Ability to maintain clinical measurements within normal limits will improve Outcome: Progressing Goal: Will remain free from infection Outcome: Progressing Goal: Diagnostic test results will improve Outcome: Progressing Goal: Respiratory complications will improve Outcome: Progressing Goal: Cardiovascular complication will be avoided Outcome: Progressing   Problem: Activity: Goal: Risk for activity intolerance will decrease Outcome: Progressing   Problem: Nutrition: Goal: Adequate nutrition will be maintained Outcome: Progressing   Problem: Coping: Goal: Level of anxiety will decrease Outcome: Progressing   Problem: Elimination: Goal: Will not experience complications related to bowel motility Outcome: Progressing Goal: Will not experience complications related to urinary retention Outcome: Progressing   Problem: Pain Managment: Goal: General experience of comfort will improve and/or be controlled Outcome: Progressing   Problem: Safety: Goal: Ability to remain free from injury will improve Outcome: Progressing   Problem: Skin Integrity: Goal: Risk for impaired skin integrity will decrease Outcome: Progressing   Problem: Activity: Goal: Ability to tolerate increased activity will improve Outcome: Progressing   Problem:  Respiratory: Goal: Patent airway maintenance will improve Outcome: Progressing   Problem: Activity: Goal: Ability to tolerate increased activity will improve Outcome: Progressing   Problem: Education: Goal: Knowledge of disease or condition will improve Outcome: Progressing Goal: Knowledge of secondary prevention will improve (MUST DOCUMENT ALL) Outcome: Progressing Goal: Knowledge of patient specific risk factors will improve (DELETE if not current risk factor) Outcome: Progressing   Problem: Intracerebral Hemorrhage Tissue Perfusion: Goal: Complications of Intracerebral Hemorrhage will be minimized Outcome: Progressing   Problem: Coping: Goal: Will verbalize positive feelings about self Outcome: Progressing Goal: Will identify appropriate support needs Outcome: Progressing   Problem: Health Behavior/Discharge Planning: Goal: Ability to manage health-related needs will improve Outcome: Progressing Goal: Goals will be collaboratively established with patient/family Outcome: Progressing   Problem: Self-Care: Goal: Ability to participate in self-care as condition permits will improve Outcome: Progressing Goal: Verbalization of feelings and concerns over difficulty with self-care will improve Outcome: Progressing Goal: Ability to communicate needs accurately will improve Outcome: Progressing   Problem: Nutrition: Goal: Risk of aspiration will decrease Outcome: Progressing Goal: Dietary intake will improve Outcome: Progressing   Problem: Education: Goal: Knowledge of disease or condition will improve Outcome: Progressing Goal: Knowledge of secondary prevention will improve (MUST DOCUMENT ALL) Outcome: Progressing Goal: Knowledge of patient specific risk factors will improve (DELETE if not current risk factor) Outcome: Progressing   Problem: Intracerebral Hemorrhage Tissue Perfusion: Goal: Complications of Intracerebral Hemorrhage will be minimized Outcome:  Progressing   Problem: Coping: Goal: Will verbalize positive feelings about self Outcome: Progressing Goal: Will identify appropriate support needs Outcome: Progressing   Problem: Health Behavior/Discharge Planning: Goal: Ability to manage health-related needs will improve Outcome: Progressing Goal: Goals will be collaboratively established with patient/family Outcome: Progressing   Problem: Self-Care: Goal: Ability to participate in self-care as condition permits will improve Outcome: Progressing Goal: Verbalization of feelings and concerns over difficulty with self-care will improve Outcome: Progressing Goal: Ability to communicate needs accurately will improve Outcome: Progressing   Problem:  Nutrition: Goal: Risk of aspiration will decrease Outcome: Progressing Goal: Dietary intake will improve Outcome: Progressing

## 2024-03-29 NOTE — Plan of Care (Signed)
  Problem: Intracerebral Hemorrhage Tissue Perfusion: Goal: Complications of Intracerebral Hemorrhage will be minimized Outcome: Progressing   Problem: Nutrition: Goal: Risk of aspiration will decrease Outcome: Progressing Goal: Dietary intake will improve Outcome: Progressing   Problem: Intracerebral Hemorrhage Tissue Perfusion: Goal: Complications of Intracerebral Hemorrhage will be minimized Outcome: Progressing   Problem: Self-Care: Goal: Ability to communicate needs accurately will improve Outcome: Not Progressing

## 2024-03-29 NOTE — Plan of Care (Signed)
 Problem: Intracerebral Hemorrhage Tissue Perfusion: Goal: Complications of Intracerebral Hemorrhage will be minimized 03/29/2024 0449 by Jahkeem Kurka, Alyce BROCKS, RN Outcome: Progressing 03/29/2024 0407 by Roth Ress, Alyce BROCKS, RN Outcome: Progressing   Problem: Nutrition: Goal: Risk of aspiration will decrease 03/29/2024 0449 by Sybilla Malhotra, Alyce BROCKS, RN Outcome: Progressing 03/29/2024 0407 by Ricky Gallery, Alyce BROCKS, RN Outcome: Progressing Goal: Dietary intake will improve 03/29/2024 0449 by Brytani Voth, Alyce BROCKS, RN Outcome: Progressing 03/29/2024 0407 by Raejean Swinford, Alyce BROCKS, RN Outcome: Progressing   Problem: Fluid Volume: Goal: Ability to maintain a balanced intake and output will improve 03/29/2024 0449 by Rayne Cowdrey, Alyce BROCKS, RN Outcome: Progressing 03/29/2024 0407 by Katelyn Kohlmeyer, Alyce BROCKS, RN Outcome: Progressing   Problem: Skin Integrity: Goal: Risk for impaired skin integrity will decrease 03/29/2024 0449 by Shiniqua Groseclose, Alyce BROCKS, RN Outcome: Progressing 03/29/2024 0407 by Isamar Nazir, Alyce BROCKS, RN Outcome: Progressing   Problem: Tissue Perfusion: Goal: Adequacy of tissue perfusion will improve 03/29/2024 0449 by Sachin Ferencz, Alyce BROCKS, RN Outcome: Progressing 03/29/2024 0407 by Mervin Ramires, Alyce BROCKS, RN Outcome: Progressing   Problem: Clinical Measurements: Goal: Ability to maintain clinical measurements within normal limits will improve 03/29/2024 0449 by Kyli Sorter, Alyce BROCKS, RN Outcome: Progressing 03/29/2024 0407 by Aliani Caccavale, Alyce BROCKS, RN Outcome: Progressing Goal: Will remain free from infection 03/29/2024 0449 by Kailiana Granquist, Alyce BROCKS, RN Outcome: Progressing 03/29/2024 0407 by Alexsandro Salek, Alyce BROCKS, RN Outcome: Progressing Goal: Diagnostic test results will improve 03/29/2024 0449 by Dejah Droessler, Alyce BROCKS, RN Outcome: Progressing 03/29/2024 0407 by Lofton Leon, Alyce BROCKS, RN Outcome: Progressing Goal: Respiratory complications will improve 03/29/2024 0449 by Chardae Mulkern, Alyce BROCKS, RN Outcome: Progressing 03/29/2024 0407 by Sierra Spargo, Alyce BROCKS, RN Outcome: Progressing Goal: Cardiovascular complication will be avoided 03/29/2024  0449 by Liliann File, Alyce BROCKS, RN Outcome: Progressing 03/29/2024 0407 by Hau Sanor, Alyce BROCKS, RN Outcome: Progressing   Problem: Activity: Goal: Risk for activity intolerance will decrease 03/29/2024 0449 by Adilynne Fitzwater, Alyce BROCKS, RN Outcome: Progressing 03/29/2024 0407 by Shylyn Younce, Alyce BROCKS, RN Outcome: Progressing   Problem: Nutrition: Goal: Adequate nutrition will be maintained 03/29/2024 0449 by Casy Tavano, Alyce BROCKS, RN Outcome: Progressing 03/29/2024 0407 by Jakerria Kingbird, Alyce BROCKS, RN Outcome: Progressing   Problem: Coping: Goal: Level of anxiety will decrease 03/29/2024 0449 by Claude Waldman, Alyce BROCKS, RN Outcome: Progressing 03/29/2024 0407 by Channah Godeaux, Alyce BROCKS, RN Outcome: Progressing   Problem: Elimination: Goal: Will not experience complications related to bowel motility 03/29/2024 0449 by Rica Heather, Alyce BROCKS, RN Outcome: Progressing 03/29/2024 0407 by Tacoya Altizer, Alyce BROCKS, RN Outcome: Progressing Goal: Will not experience complications related to urinary retention 03/29/2024 0449 by Emilea Goga, Alyce BROCKS, RN Outcome: Progressing 03/29/2024 0407 by Casha Estupinan, Alyce BROCKS, RN Outcome: Progressing   Problem: Pain Managment: Goal: General experience of comfort will improve and/or be controlled 03/29/2024 0449 by Jarrel Knoke, Alyce BROCKS, RN Outcome: Progressing 03/29/2024 0407 by Devan Danzer, Alyce BROCKS, RN Outcome: Progressing   Problem: Safety: Goal: Ability to remain free from injury will improve 03/29/2024 0449 by Serra Younan, Alyce BROCKS, RN Outcome: Progressing 03/29/2024 0407 by Aalyssa Elderkin, Alyce BROCKS, RN Outcome: Progressing   Problem: Skin Integrity: Goal: Risk for impaired skin integrity will decrease 03/29/2024 0449 by Martin Belling, Alyce BROCKS, RN Outcome: Progressing 03/29/2024 0407 by Bronnie Vasseur, Alyce BROCKS, RN Outcome: Progressing   Problem: Activity: Goal: Ability to tolerate increased activity will improve 03/29/2024 0449 by Kelii Chittum, Alyce BROCKS, RN Outcome: Progressing 03/29/2024 0407 by Rasheka Denard, Alyce BROCKS, RN Outcome: Progressing   Problem: Respiratory: Goal: Patent airway maintenance will improve 03/29/2024 0449 by  Dajane Valli, Alyce BROCKS, RN Outcome: Progressing 03/29/2024 0407 by Cleston Lautner, Alyce  C, RN Outcome: Progressing   Problem: Activity: Goal: Ability to tolerate increased activity will improve 03/29/2024 0449 by Melea Prezioso, Alyce BROCKS, RN Outcome: Progressing 03/29/2024 0407 by Bhavesh Vazquez, Alyce BROCKS, RN Outcome: Progressing   Problem: Education: Goal: Knowledge of disease or condition will improve 03/29/2024 0449 by Ashely Goosby, Alyce BROCKS, RN Outcome: Progressing 03/29/2024 0407 by Ibtisam Benge, Alyce BROCKS, RN Outcome: Progressing Goal: Knowledge of secondary prevention will improve (MUST DOCUMENT ALL) 03/29/2024 0449 by Harvir Patry, Alyce BROCKS, RN Outcome: Progressing 03/29/2024 0407 by Mylia Pondexter, Alyce BROCKS, RN Outcome: Progressing Goal: Knowledge of patient specific risk factors will improve (DELETE if not current risk factor) 03/29/2024 0449 by Deepak Bless, Alyce BROCKS, RN Outcome: Progressing 03/29/2024 0407 by Mayrin Schmuck, Alyce BROCKS, RN Outcome: Progressing   Problem: Intracerebral Hemorrhage Tissue Perfusion: Goal: Complications of Intracerebral Hemorrhage will be minimized 03/29/2024 0449 by Sherrell Farish, Alyce BROCKS, RN Outcome: Progressing 03/29/2024 0407 by Camala Talwar, Alyce BROCKS, RN Outcome: Progressing   Problem: Coping: Goal: Will verbalize positive feelings about self 03/29/2024 0449 by Ellar Hakala, Alyce BROCKS, RN Outcome: Progressing 03/29/2024 0407 by Norwood Quezada, Alyce BROCKS, RN Outcome: Progressing Goal: Will identify appropriate support needs 03/29/2024 0449 by Tayvin Preslar, Alyce BROCKS, RN Outcome: Progressing 03/29/2024 0407 by Ousmane Seeman, Alyce BROCKS, RN Outcome: Progressing   Problem: Health Behavior/Discharge Planning: Goal: Ability to manage health-related needs will improve 03/29/2024 0449 by Ja Pistole, Alyce BROCKS, RN Outcome: Progressing 03/29/2024 0407 by Perri Aragones, Alyce BROCKS, RN Outcome: Progressing Goal: Goals will be collaboratively established with patient/family 03/29/2024 0449 by Britlyn Martine, Alyce BROCKS, RN Outcome: Progressing 03/29/2024 0407 by Charlynn Salih, Alyce BROCKS, RN Outcome: Progressing   Problem: Self-Care: Goal: Ability to participate  in self-care as condition permits will improve 03/29/2024 0449 by Armonte Tortorella, Alyce BROCKS, RN Outcome: Progressing 03/29/2024 0407 by Angelika Jerrett, Alyce BROCKS, RN Outcome: Progressing Goal: Verbalization of feelings and concerns over difficulty with self-care will improve 03/29/2024 0449 by Kahlea Cobert, Alyce BROCKS, RN Outcome: Progressing 03/29/2024 0407 by Proctor Carriker, Alyce BROCKS, RN Outcome: Progressing Goal: Ability to communicate needs accurately will improve 03/29/2024 0449 by Cameo Shewell, Alyce BROCKS, RN Outcome: Progressing 03/29/2024 0407 by Herschel Fleagle, Alyce BROCKS, RN Outcome: Progressing   Problem: Nutrition: Goal: Risk of aspiration will decrease 03/29/2024 0449 by Wylee Ogden, Alyce BROCKS, RN Outcome: Progressing 03/29/2024 0407 by Haliegh Khurana, Alyce BROCKS, RN Outcome: Progressing Goal: Dietary intake will improve 03/29/2024 0449 by Della Scrivener, Alyce BROCKS, RN Outcome: Progressing 03/29/2024 0407 by Tuvia Woodrick, Alyce BROCKS, RN Outcome: Progressing   Problem: Education: Goal: Knowledge of disease or condition will improve 03/29/2024 0449 by Bayden Gil, Alyce BROCKS, RN Outcome: Progressing 03/29/2024 0407 by Lenisha Lacap, Alyce BROCKS, RN Outcome: Progressing Goal: Knowledge of secondary prevention will improve (MUST DOCUMENT ALL) 03/29/2024 0449 by Ruberta Holck, Alyce BROCKS, RN Outcome: Progressing 03/29/2024 0407 by Sherrol Vicars, Alyce BROCKS, RN Outcome: Progressing Goal: Knowledge of patient specific risk factors will improve (DELETE if not current risk factor) 03/29/2024 0449 by Herberta Pickron, Alyce BROCKS, RN Outcome: Progressing 03/29/2024 0407 by Ellyanna Holton, Alyce BROCKS, RN Outcome: Progressing   Problem: Intracerebral Hemorrhage Tissue Perfusion: Goal: Complications of Intracerebral Hemorrhage will be minimized 03/29/2024 0449 by Fareeda Downard, Alyce BROCKS, RN Outcome: Progressing 03/29/2024 0407 by Aadyn Buchheit, Alyce BROCKS, RN Outcome: Progressing   Problem: Coping: Goal: Will verbalize positive feelings about self 03/29/2024 0449 by Amylynn Fano, Alyce BROCKS, RN Outcome: Progressing 03/29/2024 0407 by Necia Kamm, Alyce BROCKS, RN Outcome: Progressing Goal: Will identify appropriate support  needs 03/29/2024 0449 by Lovelace Cerveny, Alyce BROCKS, RN Outcome: Progressing 03/29/2024 0407 by Yadiel Aubry, Alyce BROCKS, RN Outcome: Progressing   Problem: Health Behavior/Discharge Planning: Goal: Ability to manage health-related needs  will improve 03/29/2024 0449 by Katara Griner, Alyce BROCKS, RN Outcome: Progressing 03/29/2024 0407 by Sharlene Mccluskey, Alyce BROCKS, RN Outcome: Progressing Goal: Goals will be collaboratively established with patient/family 03/29/2024 0449 by Keyonta Madrid, Alyce BROCKS, RN Outcome: Progressing 03/29/2024 0407 by Lauren Modisette, Alyce BROCKS, RN Outcome: Progressing   Problem: Self-Care: Goal: Ability to participate in self-care as condition permits will improve 03/29/2024 0449 by Xiong Haidar, Alyce BROCKS, RN Outcome: Progressing 03/29/2024 0407 by Adaiah Morken, Alyce BROCKS, RN Outcome: Progressing Goal: Verbalization of feelings and concerns over difficulty with self-care will improve 03/29/2024 0449 by Turhan Chill, Alyce BROCKS, RN Outcome: Progressing 03/29/2024 0407 by Lexxus Underhill, Alyce BROCKS, RN Outcome: Progressing Goal: Ability to communicate needs accurately will improve 03/29/2024 0449 by Kyland No, Alyce BROCKS, RN Outcome: Progressing 03/29/2024 0407 by Kaelon Weekes, Alyce BROCKS, RN Outcome: Progressing   Problem: Nutrition: Goal: Risk of aspiration will decrease 03/29/2024 0449 by Audi Conover, Alyce BROCKS, RN Outcome: Progressing 03/29/2024 0407 by Placido Hangartner, Alyce BROCKS, RN Outcome: Progressing Goal: Dietary intake will improve 03/29/2024 0449 by Berkleigh Beckles, Alyce BROCKS, RN Outcome: Progressing 03/29/2024 0407 by Delara Shepheard, Alyce BROCKS, RN Outcome: Progressing

## 2024-03-29 NOTE — Progress Notes (Signed)
  Progress Note   Patient: Garrett [name] FMW:[id number] DOB: [birth year] DOA: 10/27/2023     154 DOS: the patient was seen and examined on 03/29/2024 at 11:05AM      Brief hospital course: 48 y.o. M with no prior MD follow up presented after being found down.  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  Intubated and admitted to neuro ICU.           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  Temp low 100s, subfebrile.  No tachypnea or concerns re sputum.     Physical Exam: BP 101/77   Pulse 86   Temp 99.1 F (37.3 C) (Oral)   Resp 20   Ht 5' 8 (1.727 m)   Wt 89 kg   SpO2 100%   BMI 29.83 kg/m   Adult male, persistent vegetative state, tracheostomy and PEG in place Right eye open, eyes roving, circular, blinking on the right, does not follow any commands, no purposeful eye movements RRR, no murmurs, no pitting in the extremities, generalized mild anasarca On ventilator, lung sounds diminished, no rales or wheezes appreciated Abdomen without distention, no grimace or response to palpation, no rigidity No purposeful movements, no spontaneous verbalizations or movements    Data Reviewed: None new    Family Communication:     Disposition: Status is: Inpatient         Author: Lonni [name] Dalton, MD 03/29/2024 12:58 PM  For on call review [email or web address].

## 2024-03-30 ENCOUNTER — Inpatient Hospital Stay (HOSPITAL_COMMUNITY)

## 2024-03-30 DIAGNOSIS — I619 Nontraumatic intracerebral hemorrhage, unspecified: Secondary | ICD-10-CM | POA: Diagnosis not present

## 2024-03-30 LAB — GLUCOSE, CAPILLARY
Glucose-Capillary: 94 mg/dL (ref 70–99)
Glucose-Capillary: 104 mg/dL — ABNORMAL HIGH (ref 70–99)
Glucose-Capillary: 134 mg/dL — ABNORMAL HIGH (ref 70–99)

## 2024-03-30 NOTE — Progress Notes (Signed)
 Progress Note   Patient: Garrett [name] [id number] DOB: 1975-09-12 DOA: 10/27/2023     155 DOS: the patient was seen and examined on 03/30/2024        Brief hospital course: 48 y.o. M with no prior MD follow up presented after being found down.  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  Intubated and admitted to neuro ICU.        Significant events: 3/8: Intubated in ED, pontine bleed/SAH. CT head Acute large hemorrhage 4.1 cm in pons with intraventricular extension without hydrocephalus. 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  Restarted antibiotics.  Urine culture + E. Coli, foley catheter changed 5/18: Completed antibiotics for UTI 5/22: Transferred OOU 6/11: De-cannulated 01/29/2024 6/27: Placed on 3 L oxygen overnight, tachypnea, increased secretion. Started IV Unasyn . CCM re-consulted.  7/7: Transferred back to ICU; Re-intubated 7/8: ENT consulted for trach 7/16: Trach replaced by ENT 7/17-8/5: Slow vent weaning with low success 8/6: Transferred back to hospitalist service still on vent 8/7: Not able to wean to trach collar 8/8: Only on trach collar a few hours 8/9: Only on trach collar briefly, then had to go back on mechanical ventilation 8/10: Trial ATC again     Significant studies: 3/8 CT head on admission: 4.1 cm hemorrhage centered on pons with intraventricular extension into the 4th ventricle and basal cisterns  and small [facility] 3/9 CTA head and neck: no change in IPH and [facility] 3/17 CT head: interval decrease in IPH 3/29 CT head: evolving unremarkably 5/10 CT abdomen: Foley catheter through gastrostomy tract 5/15 US  abdomen: Unremarkable 7/19 CT head: Encephalomalacia at site of prior hemorrhage    Significant microbiology data: 4/1 trach aspirate: MRSA 4/8 trach aspirate: MRSA 5/13 foley catheter urine: E coli 5/13 blood cx x2: no growth 5/14 trach aspirate: MRSA 6/1 blood cx x2: no growth 6/2 trach aspirate: MRSA 6/27 sputum: MRSA, klebsiella 7/7 trach aspirate: MRSA, candida 8/10 Urine cx: Pending 8/10 Blood culture x2: Pending 8/10 trach aspirate: Pending         Assessment and Plan: 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 Low grade fever last 3 days. - Exchange foley and culture urine - Chest x-ray - Blood culture - Tracheal aspirate culture - Continue tube feeds, bolus schedule, free water      Chronic respiratory failure due to persistent vegetative state Recurrent MRSA pneumonia S/p re-do tracheostomy 7/16 by ENT Dr. Tobie [name] replaced 7/16 - Continue ICU care   Hypertension BP controlled - Continue amlodipine , metoprolol    Normocytic anemia Stable, no bleeding   Exposure keratopathy This looks worse over the weekend.  Eye patch does not keep eye closed, and appears to have ulcer on front of eye.  All treatments here are palliative - Continue erythromycin  and lubricating ointment - Will need Ophthalmology discussion on Monday when Dr. Maree is available  Resolved issues: Hypertensive emergency Recurrent aspiration pneumonia Klebsiella pneumonia Complicated E coli UTI         Subjective: Nonverbal.  Nursing note increased work of breathing, increased secretions.  Not tolerating ATC  well     Physical Exam: BP (!) 132/94   Pulse 85   Temp 98.1 F (36.7 C) (Oral)   Resp (!) 34   Ht 5' 8 (1.727 m)   Wt 91 kg   SpO2 91%   BMI 30.50 kg/m   Adult male, trach and PEG Tachycardic, regular, heart sounds distant and muffled, but no murmurs appreciated, nonpitting edema diffusely Respiratory rate rapid, shallow, secretions present lung sounds extremely diminished, no rales really appreciated, only transmitted upper airway sounds Abdomen soft, no rigidity, no masses PT note from earlier today implies that the patient made purposeful movements to 2 out of 5 commands, but to me he makes no movements at all, I discern no meaningful or psychogenic response to touch or voice.  He has something of a startle reflex, and stirs to voice, but does not appear to have any purposeful responses.    Data Reviewed: Glucose normal    Family Communication:     Disposition: Status is: Inpatient         Author: Lonni [name] Dalton, MD 03/30/2024 1:57 PM  For on call review [email or web address].

## 2024-03-30 NOTE — Plan of Care (Signed)
  Problem: Intracerebral Hemorrhage Tissue Perfusion: Goal: Complications of Intracerebral Hemorrhage will be minimized Outcome: Progressing   Problem: Nutrition: Goal: Risk of aspiration will decrease Outcome: Progressing   Problem: Nutrition: Goal: Dietary intake will improve Outcome: Progressing   Problem: Fluid Volume: Goal: Ability to maintain a balanced intake and output will improve Outcome: Progressing   Problem: Skin Integrity: Goal: Risk for impaired skin integrity will decrease Outcome: Progressing   Problem: Tissue Perfusion: Goal: Adequacy of tissue perfusion will improve Outcome: Progressing   Problem: Clinical Measurements: Goal: Ability to maintain clinical measurements within normal limits will improve Outcome: Progressing   Problem: Clinical Measurements: Goal: Will remain free from infection Outcome: Progressing   Problem: Clinical Measurements: Goal: Diagnostic test results will improve Outcome: Progressing   Problem: Clinical Measurements: Goal: Respiratory complications will improve Outcome: Progressing   Problem: Clinical Measurements: Goal: Cardiovascular complication will be avoided Outcome: Progressing   Problem: Nutrition: Goal: Adequate nutrition will be maintained Outcome: Progressing   Problem: Elimination: Goal: Will not experience complications related to bowel motility Outcome: Progressing   Problem: Elimination: Goal: Will not experience complications related to urinary retention Outcome: Progressing   Problem: Elimination: Goal: Will not experience complications related to bowel motility Outcome: Progressing   Problem: Safety: Goal: Ability to remain free from injury will improve Outcome: Progressing   Problem: Skin Integrity: Goal: Risk for impaired skin integrity will decrease Outcome: Progressing   Plan of care, assessment, treatment, monitoring, and intervention (s) ongoing, see MAR see flowsheet

## 2024-03-31 DIAGNOSIS — I613 Nontraumatic intracerebral hemorrhage in brain stem: Secondary | ICD-10-CM | POA: Diagnosis not present

## 2024-03-31 DIAGNOSIS — I10 Essential (primary) hypertension: Secondary | ICD-10-CM | POA: Diagnosis not present

## 2024-03-31 DIAGNOSIS — J9600 Acute respiratory failure, unspecified whether with hypoxia or hypercapnia: Secondary | ICD-10-CM | POA: Diagnosis not present

## 2024-03-31 DIAGNOSIS — J9610 Chronic respiratory failure, unspecified whether with hypoxia or hypercapnia: Secondary | ICD-10-CM

## 2024-03-31 DIAGNOSIS — I619 Nontraumatic intracerebral hemorrhage, unspecified: Secondary | ICD-10-CM | POA: Diagnosis not present

## 2024-03-31 DIAGNOSIS — G935 Compression of brain: Secondary | ICD-10-CM | POA: Diagnosis not present

## 2024-03-31 LAB — BASIC METABOLIC PANEL WITH GFR
Anion gap: 10 (ref 5–15)
BUN: 16 mg/dL (ref 6–20)
CO2: 26 mmol/L (ref 22–32)
Calcium: 8.9 mg/dL (ref 8.9–10.3)
Chloride: 101 mmol/L (ref 98–111)
Creatinine, Ser: 0.46 mg/dL — ABNORMAL LOW (ref 0.61–1.24)
GFR, Estimated: >60 mL/min (ref >60)
Glucose, Bld: 125 mg/dL — ABNORMAL HIGH (ref 70–99)
Potassium: 3.9 mmol/L (ref 3.5–5.1)
Sodium: 137 mmol/L (ref 135–145)

## 2024-03-31 LAB — CBC
HCT: 34.8 % — ABNORMAL LOW (ref 39.0–52.0)
Hemoglobin: 10.5 g/dL — ABNORMAL LOW (ref 13.0–17.0)
MCH: 27 pg (ref 26.0–34.0)
MCHC: 30.2 g/dL (ref 30.0–36.0)
MCV: 89.5 fL (ref 80.0–100.0)
Platelets: 556 K/uL — ABNORMAL HIGH (ref 150–400)
RBC: 3.89 MIL/uL — ABNORMAL LOW (ref 4.22–5.81)
RDW: 15.3 % (ref 11.5–15.5)
WBC: 8.1 K/uL (ref 4.0–10.5)
nRBC: 0 % (ref 0.0–0.2)

## 2024-03-31 LAB — GLUCOSE, CAPILLARY
Glucose-Capillary: 105 mg/dL — ABNORMAL HIGH (ref 70–99)
Glucose-Capillary: 150 mg/dL — ABNORMAL HIGH (ref 70–99)

## 2024-03-31 MED ORDER — ARTIFICIAL TEARS OPHTHALMIC OINT: TOPICAL_OINTMENT | OPHTHALMIC | Status: DC

## 2024-03-31 MED ORDER — ARTIFICIAL TEARS OPHTHALMIC OINT
TOPICAL_OINTMENT | OPHTHALMIC | Status: DC
Start: 2024-03-31 — End: 2024-04-14
  Administered 2024-04-03 – 2024-04-11 (×17): 1 via OPHTHALMIC
  Filled 2024-03-31 (×3): qty 3.5

## 2024-03-31 MED ORDER — GATIFLOXACIN 0.5 % OP SOLN
1.0000 [drp] | OPHTHALMIC | Status: DC
  Administered 2024-03-31 – 2024-04-14 (×193): 1 [drp] via OPHTHALMIC
  Filled 2024-03-31 (×5): qty 2.5

## 2024-03-31 NOTE — Procedures (Signed)
 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 Specimen: None  Description of the procedure: After verbal consent obtained and appropriate time out performed. The patient was prepped and draped in the usual ophthalmic fashion and 2% lidocaine  and epinephrine  were administered to allow for appropriate anelgesia. At that point a 6-0 proline suture was placed over a ruber bolster and passed through the anterior lamella to the grey line and then through the grey line of the lower lid and out the anterior lamella of the lower lid. The suture was then passed through the lower lid bolster and the second arm of the suture was used to complete a circular closure of the lids. A Second bolster was placed over the lower lid bolster to allow for a drawstring to be created to allow for re examination. The suture was steristripped to the cheek. Patient tolerated the procedure well and good closure was noted.   I will re-evaluate later this week Please call if any issues. Antibiotic should be administered q2h.

## 2024-03-31 NOTE — Plan of Care (Signed)
  Problem: Intracerebral Hemorrhage Tissue Perfusion: Goal: Complications of Intracerebral Hemorrhage will be minimized Outcome: Progressing   Problem: Nutrition: Goal: Risk of aspiration will decrease Outcome: Progressing   Problem: Fluid Volume: Goal: Ability to maintain a balanced intake and output will improve Outcome: Progressing   Problem: Skin Integrity: Goal: Risk for impaired skin integrity will decrease Outcome: Progressing   Problem: Tissue Perfusion: Goal: Adequacy of tissue perfusion will improve Outcome: Progressing   Problem: Clinical Measurements: Goal: Will remain free from infection Outcome: Progressing   Problem: Clinical Measurements: Goal: Diagnostic test results will improve Outcome: Progressing   Problem: Clinical Measurements: Goal: Respiratory complications will improve Outcome: Progressing   Problem: Clinical Measurements: Goal: Cardiovascular complication will be avoided Outcome: Progressing   Problem: Safety: Goal: Ability to remain free from injury will improve Outcome: Progressing   Problem: Health Behavior/Discharge Planning: Goal: Goals will be collaboratively established with patient/family Outcome: Progressing   Plan of care,  assessment, monitoring, treatment, and intervention (s) ongoing, see MAR see flowsheet

## 2024-03-31 NOTE — Progress Notes (Signed)
 NAME:  Garrett [name], MRN:  [id number], DOB:  06-Dec-1975, LOS: 156 ADMISSION DATE:  10/27/2023, CONSULTATION DATE:  10/27/2023 REFERRING MD: Regalado - TRH, CHIEF COMPLAINT:  Found down   History of Present Illness:  48 y/o man who was found down at home. PMHx significant for HTN.  Patient was down for an unknown amount of time, found by his fiance when she came home from work, reportedly with agonal breathing. Patient had driven her to work the morning of admission.  He has HTN but never checks his BP and does not follow with MD. Patient's fiance states that she can tell when his BP is really high; his eyes get blood shot and he is sweating. No h/o seizures. Besides almost daily marijuana and cigarette smoking, she is not aware of any other drug use. Drug screen positive for opioids and he was given several doses of Narcan . 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 BP in ED 262/156. CXR revealed RUL and perihilar infiltrates suggesting aspiration pneumonia. ED labs also revealed PH 7.169, LA 4.4, CPK 985. Patient was intubated in ED.  PCCM consulted.  Pertinent Medical History:  Hypertension  Significant Hospital Events: Including procedures, antibiotic start and stop dates in addition to other pertinent events   3/08 - Intubated in ED, pontine bleed/SAH. CT head 17:09 Acute large hemorrhage 4.1 cm in pons with intraventricular extension without hydrocephalus 3/09 - CTA 03:14 AM No change in IPH in pons, similar biparietal [facility] 3/14 - Now awake, appears locked in can follow commands with eyes. 3/16 - Purposeful with left upper extremity  3/22 - Tracheostomy placed at bedside, bleeding issues overnight from trach site 3/24 - PEG (Dr. Sebastian) 3/24 - Sputum culture with MRSA 3/27  - Vomited. TF on hold. Abd film c/w ileus. Added reglan , got SSE. Did tolerate PSV all day  3/28 - BMs x2 after SSE day prior. Added back TFs at 1/2 rated. Tolerated PS 3/29 - Tolerated PS  3/31 - Tolerating CPAP PS 15/5, TF on hold due to ileus. 4/01 - Con't to hold tube feeds today, restarting vancomycin  and sending tracheal aspirate as peaks are 37, plat 24, driving 19. Thick secretions. Fever overnight.  4/02 - Peaks improved. Ongoing hiccups. Trickle feeding. Neuro exam unchanged.  4/20 - Trach was changed to cuffless #8 4/28 - Not safe to swallow. Limited communication and he follows simple commands. On TF 5/01 - On TC 28%, with large secretions. Working with speech and he is improving to initiate PMV trials under ST supervision   5/05 - On TC 30%, 7 L/min. Trach #6 cuffless, changed 5/1 to facilitate PMV trials  5/12 - On TC 21%, 6 L/min. Trach #6 cuffless, unable to produce sounds or speak on PMV. No significant resp secretions. On PEG TF 6/10 - Tracheostomy was removed due to failure to well provide adequate airway 02/15/2024 pulmonary critical care consult. 7/07 Patient obtunded and desaturating on NRB; transfer to icu for intubation; updated mother over phone who wants to reverse code status to full code; ent consulted 7/09 Remains intubated on no continuous sedation, right eye open but does not track movement  7/16 Revision ENT trach with [facility], [facility] Flap 7/18 Weaned on vent 5 hours  7/19 Tolerated trach collar for the majority of the day but had some increased work of breath  resulting in being placed back on vent support  7/20 No issues overnight, back on ATC this AM 7/21 Did not tolerate ATC. Placed back on PRVC. 7/24 ATC until afternoon and went on vent for WOB reportedly  7/25 ATC for a few hours  7/26 ATC x 6h 7/28 tolerating trach collar-tolerated for most of the day 7/30 on vent overnight and back on trach collar today 8/1 back on vent overnight  8/2 was able to maintain off of  ventilator overnight but by 6 AM today patient was seen with signs of increased work of breathing including tachycardia therefore patient was placed back on ventilator 8/3 remained on ventilator overnight to transition to trach collar this a.m. 8/4 did 18 hour ATC yesterday; back on vent 3am today 8/5 TC for 12-14 hours. Then went back to vent because of tachypnea.  8/6 tolerated ATC at 30% for 24 hours. Back on vent this AM for rest as was a bit tachypneic 8/11 been off vent >24 hours  Interim History / Subjective:  NAEON Off vent >24 hours  Objective:   Blood pressure (!) 141/91, pulse 84, temperature 98.8 F (37.1 C), temperature source Oral, resp. rate (!) 25, height 5' 8 (1.727 m), weight 130 kg, SpO2 100%.    FiO2 (%):  [28 %] 28 %   Intake/Output Summary (Last 24 hours) at 03/31/2024 0738 Last data filed at 03/31/2024 0545 Gross per 24 hour  Intake 1111 ml  Output 2450 ml  Net -1339 ml   Filed Weights   03/29/24 0500 03/30/24 0500 03/31/24 0500  Weight: 89 kg 91 kg 130 kg   Physical Examination: General: chronically ill appearing male HEENT: MM pink/moist; trach in place C/D/I Neuro: tracks w/ eyes CV: s1s2, RRR, no m/r/g PULM:  dim rhonchi BS bilaterally; trach collar 28% GI: soft, bsx4 active  Extremities: warm/dry, no edema  Skin: no rashes or lesions   Resolved Problem List:  Post sedation hypotension Left eye conjunctivitis - resolved  Hyperglycemia, resolved  Assessment and Plan:   Acute on chronic respiratory failure in the setting of brain bleed Status post redo tracheostomy History of MRSA pneumonia, Klebsiella pneumonia, recurrent aspiration pneumonia Tracheostomy 3/22-6/10, reintubated 7/7, ENT tracheostomy 7/16 P: -cont ATC w/ goal 48 hours today; if needed can rest on vent overnight -trach care per protocol -Wean FiO2 for SpO2 >92% -VAP bundle in place -pulm toiletry  Large pontine hemorrhage with intra ventricular hemorrhage and brain  compression History of complicated UTI with E. Coli Hypertension  Nutrition Anemia Thrombocytosis Dispo -per primary   Best Practice (right click and Reselect all SmartList Selections daily)   Per primary    JD Emilio RIGGERS [place] [facility] 03/31/2024, 8:30 AM  Please see [email or web address] for pager details.  From 7A-7P if no response, please call [phone]. After hours, please call ELink [phone].

## 2024-03-31 NOTE — Progress Notes (Signed)
  Progress Note   Patient: Garrett [name] [id number] DOB: 16-Jan-1976 DOA: 10/27/2023     156 DOS: the patient was seen and examined on 03/31/2024 at 8:20AM      Brief hospital course: 48 y.o. M with no prior MD follow up presented after being found down.  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  Intubated and admitted to neuro ICU.            Assessment and Plan: Pontine hemorrhage Persistent vegetative state On trach collar for the last 24 hours - Continue tube feeds, bolus schedule - Continue free water   Low-grade fever Temp 100.3-100.5 Tmax last few days.  Chest x-ray clear.  Foley exchanged, and urine culture with contaminant only. - Follow blood and tracheal aspirate cultures - Hold antibiotics for now, monitor fever curve  Exposure keratopathy Discussed with ophthalmology today, Dr. Waylan.  He placed sutures to sew the eyes shut/tarsorrhaphy, and we have increased the frequency of eye treatments to every 2 hours  Normocytic anemia Hemoglobin stable, no bleeding  Chronic respiratory failure due to persistent vegetative state Trach replaced 7/16 - PCCM managing ventilator wean  Hypertension Blood pressure normal - Continue amlodipine  and metoprolol   Resolved issues: Hypertensive emergency Recurrent aspiration pneumonia Klebsiella pneumonia Complicated E. coli UTI           Subjective: Nonverbal.  Nursing have no concerns.  He has been on the trach collar for 24 hours.  He does have increased secretions.  Ophthalmology reengaged today, and so the shot given worsening keratopathy.     Physical Exam: BP (!) 145/95   Pulse 81   Temp 100.3 F (37.9 C) (Oral)   Resp (!) 24   Ht 5' 8 (1.727 m)   Wt 130 kg   SpO2 98%   BMI 43.58 kg/m   Adult male, trach and PEG The left eye is opacified, appears to have a ulceration Tachycardic, no murmurs, heart sounds distant, no pitting edema Respiratory rate  shallow, rapid, secretions present, lung sounds diminished but no rales or wheezes appreciated Abdomen soft, no rigidity or grimace to palpation I observed no purposeful movements.  Data Reviewed: Blood cultures reviewed Urine culture reviewed Respiratory culture reviewed Glucose is normal Chest x-ray clear Discussed with ophthalmology     Family Communication:     Disposition: Status is: Inpatient         Author: Lonni [name] Dalton, MD 03/31/2024 2:01 PM  For on call review [email or web address].

## 2024-03-31 NOTE — Consult Note (Signed)
 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 male hospitalized and in the ICU for a pontine stroke. The patient is arousable but otherwise not interactive. The patient does not blink well in the left eye but eyes were mostly closed upon initial evaluation.    Prior to Admission medications   Medication Sig Start Date End Date Taking? Authorizing Provider  acetaminophen  (TYLENOL ) 500 MG tablet Take 1,000 mg by mouth every 6 (six) hours as needed for mild pain (pain score 1-3) or headache.   Yes [provider]  ibuprofen (ADVIL) 200 MG tablet Take 600 mg by mouth every 6 (six) hours as needed for mild pain (pain score 1-3).   Yes [provider]    History reviewed. No pertinent past medical history.  family history is not on file.  Social History   Occupational History   Not on file  Tobacco Use   Smoking status: Every Day    Current packs/day: 0.50    Average packs/day: 0.5 packs/day for 30.6 years (15.3 ttl pk-yrs)    Types: Cigarettes    Start date: 1995   Smokeless tobacco: Never  Substance and Sexual Activity   Alcohol  use: Not Currently   Drug use: Never   Sexual activity: Not on file    Allergies  Allergen Reactions   Tomato Hives, Itching and Rash    ROS: Positive as above, otherwise negative.  EXAM:  Mental Status: A&O x 3   Base Exam: Right Eye Left Eye  Visual Acuity (At near) Unable Unable  IOP (Tonopen) 16 14  Pupillary Exam No RAPD , miotic and minimally reactive out No RAPD  Motility Roving eye movements Roving eye movements, eye maintains a down and out position  Confrontation VF Unable Unable   Anterior Segment Exam    Lids/Lashes WNL WNL  Conjuctiva White and Quiet 3 +injection  Cornea Clear few fillaments Nearly opaque, however, there is no significant epithelial defect so it is  unclear if opacification is scarring due to chronic prior exposure or infectious though the latter appears less likely  Anterior Chamber Deep and Quiet Poor view  Iris Round, Reactive Round, Reactive( minimal rxn ou as prior  Lens Clear Unable  Vitreous WNL WNL   Poster Segment Exam    Disc RR Hazy Red reflex  CD ratio    Macula    Vessels    Periphery     Radiographic Studies Reviewed:  Narrative & Impression  CLINICAL DATA:  Stroke, hemorrhagic   EXAM: CT HEAD WITHOUT CONTRAST   TECHNIQUE: Contiguous axial images were obtained from the base of the skull through the vertex without intravenous contrast.   RADIATION DOSE REDUCTION: This exam was performed according to the departmental dose-optimization program which includes automated exposure control, adjustment of the mA and/or kV according to patient size and/or use of iterative reconstruction technique.   COMPARISON:  CT head March 8 25.   FINDINGS: Brain: No substantial change in acute intraparenchymal hemorrhage within the pons with mild increase in the intraventricular extension into the adjacent fourth ventricle and layering in the occipital horn of the left lateral ventricle. Slight increase in small volume of subarachnoid hemorrhage along the parietal convexities. No midline shift. Effacement of the fourth ventricle without hydrocephalus.   Vascular: No hyperdense vessel.   Skull: No acute fracture.   Sinuses/Orbits: Mild  paranasal sinus mucosal thickening. No acute orbital findings.   Other: No mastoid effusions.   IMPRESSION: 1. No substantial change in acute intraparenchymal hemorrhage within the pons with mild increase in the intraventricular extension into the adjacent fourth ventricle and layering in the occipital horn of the left lateral ventricle. No hydrocephalus. 2. Mild increase in small volume of subarachnoid hemorrhage along the parietal convexities.     Electronically Signed   By:  Gilmore GORMAN Molt M.D.   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 After removal of the tarsorrhaphy, extreme care will be required to keep the patients eye closed at all times.  Please continue all care orders for the right eye.  Will follow up later this week to repeat exam.  All staff to take care not to pull or remove suture tail on drawstring tarsorrhaphy. Please call if any issues or if eye begins to open.     Please call with any questions.  Adine Haddock MD [facility] Ophthalmology [phone]

## 2024-04-01 ENCOUNTER — Inpatient Hospital Stay (HOSPITAL_COMMUNITY)

## 2024-04-01 DIAGNOSIS — M7989 Other specified soft tissue disorders: Secondary | ICD-10-CM | POA: Diagnosis not present

## 2024-04-01 DIAGNOSIS — Z930 Tracheostomy status: Secondary | ICD-10-CM | POA: Diagnosis not present

## 2024-04-01 DIAGNOSIS — I613 Nontraumatic intracerebral hemorrhage in brain stem: Secondary | ICD-10-CM | POA: Diagnosis not present

## 2024-04-01 DIAGNOSIS — J9620 Acute and chronic respiratory failure, unspecified whether with hypoxia or hypercapnia: Secondary | ICD-10-CM | POA: Diagnosis not present

## 2024-04-01 DIAGNOSIS — B965 Pseudomonas (aeruginosa) (mallei) (pseudomallei) as the cause of diseases classified elsewhere: Secondary | ICD-10-CM | POA: Diagnosis not present

## 2024-04-01 DIAGNOSIS — I619 Nontraumatic intracerebral hemorrhage, unspecified: Secondary | ICD-10-CM | POA: Diagnosis not present

## 2024-04-01 LAB — URINE CULTURE: Culture: 80000 — AB

## 2024-04-01 MED ORDER — SODIUM CHLORIDE 0.9 % IV SOLN
2.0000 g | Freq: Three times a day (TID) | INTRAVENOUS | Status: AC
  Administered 2024-04-01 – 2024-04-14 (×46): 2 g via INTRAVENOUS
  Filled 2024-04-01 (×40): qty 12.5

## 2024-04-01 NOTE — Progress Notes (Signed)
 NAME:  Garrett [name], MRN:  [id number], DOB:  07-11-1976, LOS: 157 ADMISSION DATE:  10/27/2023, CONSULTATION DATE:  10/27/2023 REFERRING MD: Regalado - TRH, CHIEF COMPLAINT:  Found down   History of Present Illness:  48 y/o man who was found down at home. PMHx significant for HTN.  Patient was down for an unknown amount of time, found by his fiance when she came home from work, reportedly with agonal breathing. Patient had driven her to work the morning of admission.  He has HTN but never checks his BP and does not follow with MD. Patient's fiance states that she can tell when his BP is really high; his eyes get blood shot and he is sweating. No h/o seizures. Besides almost daily marijuana and cigarette smoking, she is not aware of any other drug use. Drug screen positive for opioids and he was given several doses of Narcan . 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 BP in ED 262/156. CXR revealed RUL and perihilar infiltrates suggesting aspiration pneumonia. ED labs also revealed PH 7.169, LA 4.4, CPK 985. Patient was intubated in ED.  PCCM consulted.  Pertinent Medical History:  Hypertension  Significant Hospital Events: Including procedures, antibiotic start and stop dates in addition to other pertinent events   3/08 - Intubated in ED, pontine bleed/SAH. CT head 17:09 Acute large hemorrhage 4.1 cm in pons with intraventricular extension without hydrocephalus 3/09 - CTA 03:14 AM No change in IPH in pons, similar biparietal [facility] 3/14 - Now awake, appears locked in can follow commands with eyes. 3/16 - Purposeful with left upper extremity  3/22 - Tracheostomy placed at bedside, bleeding issues overnight from trach site 3/24 - PEG (Dr. Sebastian) 3/24 - Sputum culture with MRSA 3/27  - Vomited. TF on hold. Abd film c/w ileus. Added reglan , got SSE. Did tolerate PSV all day  3/28 - BMs x2 after SSE day prior. Added back TFs at 1/2 rated. Tolerated PS 3/29 - Tolerated PS  3/31 - Tolerating CPAP PS 15/5, TF on hold due to ileus. 4/01 - Con't to hold tube feeds today, restarting vancomycin  and sending tracheal aspirate as peaks are 37, plat 24, driving 19. Thick secretions. Fever overnight.  4/02 - Peaks improved. Ongoing hiccups. Trickle feeding. Neuro exam unchanged.  4/20 - Trach was changed to cuffless #8 4/28 - Not safe to swallow. Limited communication and he follows simple commands. On TF 5/01 - On TC 28%, with large secretions. Working with speech and he is improving to initiate PMV trials under ST supervision   5/05 - On TC 30%, 7 L/min. Trach #6 cuffless, changed 5/1 to facilitate PMV trials  5/12 - On TC 21%, 6 L/min. Trach #6 cuffless, unable to produce sounds or speak on PMV. No significant resp secretions. On PEG TF 6/10 - Tracheostomy was removed due to failure to well provide adequate airway 02/15/2024 pulmonary critical care consult. 7/07 Patient obtunded and desaturating on NRB; transfer to icu for intubation; updated mother over phone who wants to reverse code status to full code; ent consulted 7/09 Remains intubated on no continuous sedation, right eye open but does not track movement  7/16 Revision ENT trach with [facility] Flap 7/18 Weaned on vent 5 hours  7/19 Tolerated trach collar for the majority of the day but had some increased work of breath  resulting in being placed back on vent support  7/20 No issues overnight, back on ATC this AM 7/21 Did not tolerate ATC. Placed back on PRVC. 7/24 ATC until afternoon and went on vent for WOB reportedly  7/25 ATC for a few hours  7/26 ATC x 6h 7/28 tolerating trach collar-tolerated for most of the day 7/30 on vent overnight and back on trach collar today 8/1 back on vent overnight  8/2 was able to maintain off of  ventilator overnight but by 6 AM today patient was seen with signs of increased work of breathing including tachycardia therefore patient was placed back on ventilator 8/3 remained on ventilator overnight to transition to trach collar this a.m. 8/4 did 18 hour ATC yesterday; back on vent 3am today 8/5 TC for 12-14 hours. Then went back to vent because of tachypnea.  8/6 tolerated ATC at 30% for 24 hours. Back on vent this AM for rest as was a bit tachypneic 8/11 been off vent >24 hours 8/12-off vent for over 48 hours  Interim History / Subjective:  Tmax 100.9 Objective:   Blood pressure 122/89, pulse 82, temperature 98 F (36.7 C), temperature source Axillary, resp. rate (!) 23, height 5' 8 (1.727 m), weight 128 kg, SpO2 99%.    FiO2 (%):  [28 %] 28 %   Intake/Output Summary (Last 24 hours) at 04/01/2024 1112 Last data filed at 04/01/2024 0558 Gross per 24 hour  Intake 674 ml  Output 1900 ml  Net -1226 ml   Filed Weights   03/30/24 0500 03/31/24 0500 04/01/24 0729  Weight: 91 kg 130 kg 128 kg   Physical Examination: General: Chronically ill-appearing  HEENT: Trach in place Neuro: tracks w/ eyes CV: s1s2, RRR, no m/r/g PULM:  dim rhonchi BS bilaterally; trach collar 28% GI: soft, bsx4 active  Extremities: warm/dry, no edema  Skin: no rashes or lesions   Tracheal aspirate and urine both with Pseudomonas Chest x-ray with no acute infiltrate Blood cultures with no growth  Resolved Problem List:  Post sedation hypotension Left eye conjunctivitis - resolved  Hyperglycemia, resolved  Assessment and Plan:   With Pseudomonas in 2 different sites though with no white count, borderline persistent fevers - Agree with treatment with antipseudomonal for 14 days - Follow sensitivities  Patient may be transferred out of the unit since he has been off the ventilator for over 48 hours  Acute on chronic respiratory failure in the setting of brain bleed Status post redo  tracheostomy History of MRSA pneumonia, Klebsiella pneumonia, recurrent aspiration pneumonia  Large pontine hemorrhage  History of hypertension  Pulmonary will continue to follow once a week once moved out of the unit

## 2024-04-01 NOTE — Progress Notes (Signed)
 BLE venous exam is completed. Bronda Alfred, RVT

## 2024-04-01 NOTE — Progress Notes (Signed)
 Progress Note   Patient: Garrett [name] FMW:[id number] DOB: 12-Nov-1975 DOA: 10/27/2023     157 DOS: the patient was seen and examined on 04/01/2024 at 11:00AM      Brief hospital course: 49 y.o. M with no prior MD follow up presented after being found down.  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  Intubated and admitted to neuro ICU.        Significant events: 3/8: Intubated in ED, pontine bleed/SAH. CT head Acute large hemorrhage 4.1 cm in pons with intraventricular extension without hydrocephalus. 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  Restarted antibiotics.  Urine culture + E. Coli, foley catheter changed 5/18: Completed antibiotics for UTI 5/22: Transferred OOU 6/11: De-cannulated 01/29/2024 6/27: Placed on 3 L oxygen overnight, tachypnea, increased secretion. Started IV Unasyn . CCM re-consulted.  7/7: Transferred back to ICU; Re-intubated 7/8: ENT consulted for trach 7/16: Trach replaced by ENT 7/17-8/5: Slow vent weaning with low success 8/6: Transferred back to hospitalist service still on vent 8/7: Not able to wean to trach collar 8/8: Only on trach collar a few hours 8/9: Only on trach collar briefly, then had to go back on mechanical ventilation 8/11: Tolerating ATC 24 hours, Ophtho reconsulted, placed tarsorrhaphy     Significant studies: 3/8 CT head on admission: 4.1 cm hemorrhage centered on pons with  intraventricular extension into the 4th ventricle and basal cisterns and small [facility] 3/9 CTA head and neck: no change in IPH and [facility] 3/17 CT head: interval decrease in IPH 3/29 CT head: evolving unremarkably 5/10 CT abdomen: Foley catheter through gastrostomy tract 5/15 US  abdomen: Unremarkable 7/19 CT head: Encephalomalacia at site of prior hemorrhage    Significant microbiology data: 4/1 trach aspirate: MRSA 4/8 trach aspirate: MRSA 5/13 foley catheter urine: E coli 5/13 blood cx x2: no growth 5/14 trach aspirate: MRSA 6/1 blood cx x2: no growth 6/2 trach aspirate: MRSA 6/27 sputum: MRSA, klebsiella 7/7 trach aspirate: MRSA, candida 8/10 Urine cx: PsA  8/10 Blood culture x2: NGTD 8/10 trach aspirate: E coli and PsA         Assessment and Plan: 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 Tobie Now able to tolerate trach collar for last 48 hours. - Transferred to progressive care on trach collar - Pulmonology to follow weekly for trach management   Recurrent fever 8/11, suspect pseudomonal/E. coli tracheitis New fevers in the last week, cultures from blood, urine, tracheal aspirate showed colonization with Pseudomonas, also E. coli on trachea.  Discussed with pulmonology, they recommended empiric treatment, probably for tracheitis, possible CXR-negative pneumonia. 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  Ophtho Dr.  Bowen consulted on 8/11 and placed tarsorraphy on 8/11. - q2 moxifloxacin and moisturizing ointment - Ophtho will repeat eye exam later  this week  Hypertension Blood pressure mostly controlled - Continue amlodipine  and metoprolol   Normocytic anemia Stable without bleeding  Class III obesity BMI 42, complicates care    Resolved issues: Hypertensive emergency Recurrent aspiration pneumonia Klebsiella pneumonia Complicated E. coli UTI          Subjective: No change in neurological status.  Mild fever overnight.  No nursing concerns.  Frequent nonpurulent sputum suctioned from tracheostomy.     Physical Exam: BP (!) 151/105   Pulse 81   Temp 98 F (36.7 C) (Axillary)   Resp (!) 27   Ht 5' 8 (1.727 m)   Wt 128 kg   SpO2 98%   BMI 42.91 kg/m   Obese adult male, on trach collar, unresponsive RRR, no murmurs, generalized anasarca, but no pitting edema Respiratory rate seems increased, lung sounds diminished, coarse upper airway sounds, but no crackles or wheezes appreciated Abdomen soft, no distention or ascites, no masses, no grimace to palpation Patient is unresponsive, left eye sewed shut, right eye open, spontaneous movements, does not focus on anything, seems to consistently move left thumbs or left toes to command, very weakly    Data Reviewed: Discussed with critical care Basic metabolic panel shows normal electrolytes and renal function CBC shows stable anemia Culture data reviewed        Disposition: Status is: Inpatient 48 year old man with pontine hemorrhage and persistent vegetative state, now trach and PEG dependent.  Finally weaned off of ventilator for last 48 hours, will transfer out of unit.  Starting new cefepime .  Will need LTAC eventually        Author: Lonni [name] Dalton, MD 04/01/2024 1:49 PM  For on call review [email or web address].

## 2024-04-02 DIAGNOSIS — I619 Nontraumatic intracerebral hemorrhage, unspecified: Secondary | ICD-10-CM | POA: Diagnosis not present

## 2024-04-02 LAB — COMPREHENSIVE METABOLIC PANEL WITH GFR
ALT: 98 U/L — ABNORMAL HIGH (ref 0–44)
AST: 44 U/L — ABNORMAL HIGH (ref 15–41)
Albumin: 2.8 g/dL — ABNORMAL LOW (ref 3.5–5.0)
Alkaline Phosphatase: 57 U/L (ref 38–126)
Anion gap: 8 (ref 5–15)
BUN: 22 mg/dL — ABNORMAL HIGH (ref 6–20)
CO2: 30 mmol/L (ref 22–32)
Calcium: 9.5 mg/dL (ref 8.9–10.3)
Chloride: 102 mmol/L (ref 98–111)
Creatinine, Ser: 0.37 mg/dL — ABNORMAL LOW (ref 0.61–1.24)
GFR, Estimated: >60 mL/min (ref >60)
Glucose, Bld: 99 mg/dL (ref 70–99)
Potassium: 4.5 mmol/L (ref 3.5–5.1)
Sodium: 140 mmol/L (ref 135–145)
Total Bilirubin: 0.2 mg/dL (ref 0.0–1.2)
Total Protein: 7.6 g/dL (ref 6.5–8.1)

## 2024-04-02 LAB — CBC
HCT: 35.7 % — ABNORMAL LOW (ref 39.0–52.0)
Hemoglobin: 10.6 g/dL — ABNORMAL LOW (ref 13.0–17.0)
MCH: 26.9 pg (ref 26.0–34.0)
MCHC: 29.7 g/dL — ABNORMAL LOW (ref 30.0–36.0)
MCV: 90.6 fL (ref 80.0–100.0)
Platelets: 529 K/uL — ABNORMAL HIGH (ref 150–400)
RBC: 3.94 MIL/uL — ABNORMAL LOW (ref 4.22–5.81)
RDW: 15.2 % (ref 11.5–15.5)
WBC: 8.5 K/uL (ref 4.0–10.5)
nRBC: 0 % (ref 0.0–0.2)

## 2024-04-02 LAB — CULTURE, RESPIRATORY W GRAM STAIN

## 2024-04-02 NOTE — Progress Notes (Signed)
 OPHTHALMOLOGY CONSULT NOTE  Date: 04/02/24 Time: 12:42 PM  Patient Name: Garrett [name]  DOB: 12/13/1975 MRN: [id number]  Reason for Consult: Exposure keratopathy  HPI:  This is a 48 y.o. Male POD 2 after temporary tarsorrhaphy placement for severe exposure keratopathy.    Prior to Admission medications   Medication Sig Start Date End Date Taking? Authorizing Provider  acetaminophen  (TYLENOL ) 500 MG tablet Take 1,000 mg by mouth every 6 (six) hours as needed for mild pain (pain score 1-3) or headache.   Yes [provider]  ibuprofen (ADVIL) 200 MG tablet Take 600 mg by mouth every 6 (six) hours as needed for mild pain (pain score 1-3).   Yes [provider]    History reviewed. No pertinent past medical history.  family history is not on file.  Social History   Occupational History   Not on file  Tobacco Use   Smoking status: Every Day    Current packs/day: 0.50    Average packs/day: 0.5 packs/day for 30.6 years (15.3 ttl pk-yrs)    Types: Cigarettes    Start date: 1995   Smokeless tobacco: Never  Substance and Sexual Activity   Alcohol  use: Not Currently   Drug use: Never   Sexual activity: Not on file    Allergies  Allergen Reactions   Tomato Hives, Itching and Rash    ROS: Positive as above, otherwise negative.  EXAM:  Mental Status: Minimally responsive.   Base Exam: Right Eye Left Eye  Visual Acuity (At near) Unable Unable  IOP (Tonopen)  16  Pupillary Exam Min RXN ou Poor view  Motility Roving motions Roving motions  Confrontation VF Unable  Unable    Anterior Segment Exam    Lids/Lashes WNL Tarsorrhaphy in great position with no signs of inflammation or cheese wiring.   Conjuctiva White and Quiet Injection improved moderately from initial examination.   Cornea Clear Diffuse stromal haze/ scarring. Does not appear to be an active infection at this point but chronic changes. There is no epi defect at this point. There was a central  epithelial elevation that is improving.   Anterior Chamber Deep and Quiet Poor view  Iris Round, Reactive Round, min Reactive, hazy view.   Lens Clear unable   Assessment and Recommendation: Exposure keratopathy: Continue all medication as currently using. Again at this point I think an active infection is less likely and that corneal haze represents scarring from chronic keratopathy. Despite this will continue Abx gtts q2h for now while tarsorraphy is in place.  I will re-examine next Monday given clinical improvement today. Call is issues related to the tarsorrhaphy.    Please call with any questions.  Adine Haddock MD [facility] Ophthalmology [phone]'

## 2024-04-02 NOTE — Progress Notes (Signed)
 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  He has had a prolonged hospitalization, admitted in March 2025.  Hospital course complicated by persistent vegetative state, recurrent pneumonias, now has a tracheostomy and a PEG tube  Significant events: 3/8: Intubated in ED, pontine bleed/SAH. CT head Acute large hemorrhage 4.1 cm in pons with intraventricular extension without hydrocephalus. 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  Restarted antibiotics.  Urine culture + E. Coli, foley catheter changed 5/18: Completed antibiotics for UTI 5/22: Transferred [facility] 6/11: De-cannulated 01/29/2024 6/27: Placed on 3 L oxygen overnight, tachypnea, increased secretion. Started IV Unasyn . CCM re-consulted.  7/7: Transferred back to ICU; Re-intubated 7/8: ENT consulted for trach 7/16: Trach replaced by ENT 7/17-8/5: Slow vent weaning with low success 8/6: Transferred back to hospitalist service still on vent 8/7: Not able to wean to trach collar 8/8: Only on trach collar a few hours 8/9: Only on trach collar briefly, then had to go back on  mechanical ventilation 8/11: Tolerating ATC 24 hours, Ophtho reconsulted, placed tarsorrhaphy    Significant studies: 3/8 CT head on admission: 4.1 cm hemorrhage centered on pons with intraventricular extension into the 4th ventricle and basal cisterns and small [facility] 3/9 CTA head and neck: no change in IPH and [facility] 3/17 CT head: interval decrease in IPH 3/29 CT head: evolving unremarkably 5/10 CT abdomen: Foley catheter through gastrostomy tract 5/15 US  abdomen: Unremarkable 7/19 CT head: Encephalomalacia at site of prior hemorrhage   Significant microbiology data: 4/1 trach aspirate: MRSA 4/8 trach aspirate: MRSA 5/13 foley catheter urine: E coli 5/13 blood cx x2: no growth 5/14 trach aspirate: MRSA 6/1 blood cx x2: no growth 6/2 trach aspirate: MRSA 6/27 sputum: MRSA, klebsiella 7/7 trach aspirate: MRSA, candida 8/10 Urine cx: PsA  8/10 Blood culture x2: NGTD 8/10 trach aspirate: E coli and PsA  Subjective / 24h Interval events: Unresponsive  Assesement and Plan: Principal Problem:   ICH (intracerebral hemorrhage) (HCC) Active Problems:   Advanced care planning/counseling discussion   Poor prognosis   Tracheostomy status (HCC)   Pressure injury of skin   Goals of care, counseling/discussion   Chronic respiratory failure with hypoxia (HCC)   Acute respiratory failure (HCC)   Chronic hypoxic respiratory failure (HCC)   Pontine hemorrhage (HCC)   Principal problem 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 Tobie -Now able to tolerate trach collar for the last few days.  Pulmonary following intermittently for trach management.  Recurrent fever 8/11, suspect  pseudomonal/E. coli tracheitis - New fevers in the last week, cultures from blood, urine, tracheal aspirate showed colonization with Pseudomonas, also E. coli on trachea.  Discussed with pulmonology, they recommended empiric treatment, probably for tracheitis, possible CXR-negative pneumonia. -Has been placed on cefepime , today is day #2/14 after Dr. 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  Ophtho Dr. Waylan consulted on 8/11 and placed tarsorraphy on 8/11. - q2 moxifloxacin and moisturizing ointment -Ophthalmology will follow-up later this week   Hypertension -blood pressure stable, continue amlodipine , metoprolol    Normocytic anemia - Stable without bleeding   Class III obesity - BMI 42, complicates care  Scheduled Meds:  amLODipine   5 mg Per Tube Daily   artificial tears   Right Eye Q2H   Chlorhexidine  Gluconate Cloth  6 each Topical Daily   docusate  100 mg Per Tube BID   enoxaparin  (LOVENOX ) injection  40 mg Subcutaneous Daily   famotidine   20 mg Per Tube BID   feeding supplement (JEVITY 1.5 CAL/FIBER)  237 mL Per Tube 5 X Daily   feeding supplement (PROSource TF20)  60 mL Per Tube TID   free water   200 mL Per Tube BID   gatifloxacin   1 drop Left Eye Q2H   metoprolol  tartrate  75 mg Per Tube BID   multivitamin with minerals  1 tablet Per Tube Daily   mouth rinse  15 mL Mouth Rinse Q2H   polyethylene glycol  17 g Per Tube BID   sodium chloride  flush  10-40 mL Intracatheter Q12H   Continuous Infusions:  ceFEPime  (MAXIPIME ) IV 2 g (04/02/24 0606)   PRN Meds:.acetaminophen  **OR** acetaminophen  (TYLENOL ) oral liquid 160 mg/5 mL **OR** acetaminophen , bisacodyl , Gerhardt's butt cream, hydrALAZINE , ipratropium-albuterol , mouth rinse, sodium chloride  flush, traZODone   Current Outpatient Medications  Medication Instructions   acetaminophen  (TYLENOL ) 1,000 mg, Oral,  Every 6 hours PRN   ibuprofen (ADVIL) 600 mg, Oral, Every 6 hours PRN    Diet Orders (From admission, onward)     Start     Ordered   03/04/24 2359  Diet NPO time specified  Diet effective midnight        03/03/24 1402            DVT prophylaxis: enoxaparin  (LOVENOX ) injection 40 mg Start: 03/06/24 1000 SCDs Start: 10/27/23 2226   Lab Results  Component Value Date   PLT 529 (H) 04/02/2024      Code Status: Full Code  Family Communication: No family at bedside  Status is: Inpatient Remains inpatient appropriate because: Severity of illness  Level of care: Progressive  Consultants:  PCCM ENT Ophthalmology  Objective: Vitals:   04/02/24 0400 04/02/24 0500 04/02/24 0600 04/02/24 0723  BP: (!) 139/96 (!) 134/94 139/89   Pulse: 68 71 69 76  Resp: (!) 23 (!) 26 20 (!) 22  Temp:    97.9 F (36.6 C)  TempSrc:    Axillary  SpO2: 96% 98% 98% 96%  Weight:      Height:        Intake/Output Summary (Last 24 hours) at 04/02/2024 1049 Last data filed at 04/02/2024 0616 Gross per 24 hour  Intake 585.46 ml  Output 1125 ml  Net -539.54 ml   Wt Readings from Last 3 Encounters:  04/01/24 128 kg    Examination:  Constitutional: No discomfort, unresponsive ENMT: Mucous membranes are moist.  Neck: normal, supple Respiratory: Shallow respirations but overall clear without wheezing or crackles Cardiovascular: Regular rate and rhythm, no murmurs / rubs / gallops. No LE edema.  Abdomen: non distended, no tenderness. Bowel sounds positive.  Musculoskeletal: no clubbing / cyanosis.  Skin: no rashes Neurologic: Unable to assess   Data Reviewed: I have independently reviewed following labs and imaging studies   CBC Recent Labs  Lab 03/28/24 0422 03/31/24 1744 04/02/24 0436  WBC 8.2 8.1 8.5  HGB 9.4* 10.5* 10.6*  HCT 31.8* 34.8* 35.7*  PLT 172 556* 529*  MCV 91.4 89.5 90.6  MCH 27.0 27.0 26.9  MCHC 29.6* 30.2 29.7*  RDW 15.9* 15.3 15.2    Recent Labs  Lab  03/28/24 0422 03/31/24 1744 04/02/24 0436  NA 144 137 140  K 4.1 3.9 4.5  CL 105 101 102  CO2 25 26 30   GLUCOSE 99 125* 99  BUN 29* 16 22*  CREATININE 0.64 0.46* 0.37*  CALCIUM 9.3 8.9 9.5  AST 40  --  44*  ALT 91*  --  98*  ALKPHOS 57  --  57  BILITOT 0.4  --  0.2  ALBUMIN  2.8*  --  2.8*    ------------------------------------------------------------------------------------------------------------------ No results for input(s): CHOL, HDL, LDLCALC, TRIG, CHOLHDL, LDLDIRECT in the last 72 hours.  Lab Results  Component Value Date   HGBA1C 4.7 (L) 10/27/2023   ------------------------------------------------------------------------------------------------------------------ No results for input(s): TSH, T4TOTAL, T3FREE, THYROIDAB in the last 72 hours.  Invalid input(s): FREET3  Cardiac Enzymes No results for input(s): CKMB, TROPONINI, MYOGLOBIN in the last 168 hours.  Invalid input(s): CK ------------------------------------------------------------------------------------------------------------------    Component Value Date/Time   BNP 210.4 (H) 10/27/2023 1820    CBG: Recent Labs  Lab 03/30/24 0626 03/30/24 1107 03/30/24 2327 03/31/24 0327 03/31/24 0747  GLUCAP 94 104* 134* 105* 150*    Recent Results (from the past 240 hours)  MRSA Next Gen by PCR, Nasal     Status: Abnormal   Collection Time: 03/24/24  2:01 PM   Specimen: Nasal Mucosa; Nasal Swab  Result Value Ref Range Status   MRSA by PCR Next Gen (A) NOT DETECTED Corrected    INVALID, UNABLE TO DETERMINE THE PRESENCE OF TARGET DUE TO SPECIMEN INTEGRITY. RECOLLECTION REQUESTED.    Comment: NOTIFIED RN SAUNDRA JACKSON ON 03/24/24 @ 1739 BY DRT Performed at [facility], [address], [place], [name] [address]   MRSA Next Gen by PCR, Nasal     Status: Abnormal   Collection Time: 03/24/24  5:40 PM   Specimen: Nasal Mucosa; Nasal Swab  Result Value Ref Range Status    MRSA by PCR Next Gen (A) NOT DETECTED Final    INVALID, UNABLE TO DETERMINE THE PRESENCE OF TARGET DUE TO SPECIMEN INTEGRITY. RECOLLECTION REQUESTED.    Comment: RESULT CALLED TO, READ BACK BY AND VERIFIED WITH: RN FABIENE MOLT [phone] @2027  FH Performed at [facility], [address], [place], [name] [address]   MRSA Next Gen by PCR, Nasal     Status: Abnormal   Collection Time: 03/25/24  6:17 AM   Specimen: Nasal Mucosa; Nasal Swab  Result Value Ref Range Status   MRSA by PCR Next Gen DETECTED (A) NOT DETECTED Final    Comment: RESULT CALLED TO, READ BACK BY AND VERIFIED WITH: RN DAN WHITE ON 03/25/24 @ 1500 BY DRT (NOTE) The GeneXpert MRSA Assay (FDA approved for NASAL specimens only), is one component of a comprehensive MRSA colonization surveillance program.  It is not intended to diagnose MRSA infection nor to guide or monitor treatment for MRSA infections. Test performance is not FDA approved in patients less than 14 years old. Performed at [facility], [address] [place], [name] [address]   Culture, Respiratory w Gram Stain     Status: None   Collection Time: 03/30/24 10:42 AM   Specimen: Tracheal Aspirate; Respiratory  Result Value Ref Range Status   Specimen Description TRACHEAL ASPIRATE  Final   Special Requests NONE  Final   Gram Stain   Final    ABUNDANT WBC PRESENT, PREDOMINANTLY PMN FEW GRAM NEGATIVE DIPLOCOCCI FEW GRAM POSITIVE COCCI RARE GRAM POSITIVE RODS FEW GRAM NEGATIVE RODS Performed at [facility], [address] [place], [name] [address]    Culture   Final    FEW ESCHERICHIA COLI RARE PSEUDOMONAS AERUGINOSA    Report Status 04/02/2024 FINAL  Final   Organism ID, Bacteria ESCHERICHIA COLI  Final   Organism ID, Bacteria PSEUDOMONAS AERUGINOSA  Final      Susceptibility   Escherichia coli - MIC*    AMPICILLIN  >=32 RESISTANT Resistant     CEFAZOLIN  Value in next row Resistant      >=32 RESISTANTThis is a modified FDA-approved  test that has been validated and its performance characteristics determined by the reporting laboratory.  This laboratory is certified under the Clinical Laboratory Improvement Amendments CLIA as qualified to perform high complexity clinical laboratory testing.    CEFEPIME  Value in next row Sensitive      >=32 RESISTANTThis is a modified FDA-approved test that has been validated and its performance characteristics determined by the reporting laboratory.  This laboratory is certified under the Clinical Laboratory Improvement Amendments CLIA as qualified to perform high complexity clinical laboratory testing.    ERTAPENEM Value in next row Sensitive      >=32 RESISTANTThis is a modified FDA-approved test that has been validated and its performance characteristics determined by the reporting laboratory.  This laboratory is certified under the Clinical Laboratory Improvement Amendments CLIA as qualified to perform high complexity clinical laboratory testing.    CEFTRIAXONE  Value in next row Sensitive      >=32 RESISTANTThis is a modified FDA-approved test that has been validated and its performance characteristics determined by the reporting laboratory.  This laboratory is certified under the Clinical Laboratory Improvement Amendments CLIA as qualified to perform high complexity clinical laboratory testing.    CIPROFLOXACIN  Value in next row Sensitive      >=32 RESISTANTThis is a modified FDA-approved test that has been validated and its performance characteristics determined by the reporting laboratory.  This laboratory is certified under the Clinical Laboratory Improvement Amendments CLIA as qualified to perform high complexity clinical laboratory testing.    GENTAMICIN Value in next row Sensitive      >=32 RESISTANTThis is a modified FDA-approved test that has been validated and its performance characteristics determined by the reporting laboratory.  This laboratory is certified under the Clinical  Laboratory Improvement Amendments CLIA as qualified to perform high complexity clinical laboratory testing.    MEROPENEM Value in next row Sensitive      >=32 RESISTANTThis is a modified FDA-approved test that has been validated and its performance characteristics determined by the reporting laboratory.  This laboratory is certified under the Clinical Laboratory Improvement Amendments CLIA as qualified to perform high complexity clinical laboratory testing.    TRIMETH/SULFA Value in next row Sensitive      >=32 RESISTANTThis  is a modified FDA-approved test that has been validated and its performance characteristics determined by the reporting laboratory.  This laboratory is certified under the Clinical Laboratory Improvement Amendments CLIA as qualified to perform high complexity clinical laboratory testing.    AMPICILLIN /SULBACTAM Value in next row Resistant      >=32 RESISTANTThis is a modified FDA-approved test that has been validated and its performance characteristics determined by the reporting laboratory.  This laboratory is certified under the Clinical Laboratory Improvement Amendments CLIA as qualified to perform high complexity clinical laboratory testing.    PIP/TAZO Value in next row Resistant ug/mL     >=128 RESISTANTThis is a modified FDA-approved test that has been validated and its performance characteristics determined by the reporting laboratory.  This laboratory is certified under the Clinical Laboratory Improvement Amendments CLIA as qualified to perform high complexity clinical laboratory testing.    * FEW ESCHERICHIA COLI   Pseudomonas aeruginosa - MIC*    MEROPENEM Value in next row Sensitive      >=128 RESISTANTThis is a modified FDA-approved test that has been validated and its performance characteristics determined by the reporting laboratory.  This laboratory is certified under the Clinical Laboratory Improvement Amendments CLIA as qualified to perform high complexity clinical  laboratory testing.    CIPROFLOXACIN  Value in next row Sensitive      >=128 RESISTANTThis is a modified FDA-approved test that has been validated and its performance characteristics determined by the reporting laboratory.  This laboratory is certified under the Clinical Laboratory Improvement Amendments CLIA as qualified to perform high complexity clinical laboratory testing.    IMIPENEM Value in next row Sensitive      >=128 RESISTANTThis is a modified FDA-approved test that has been validated and its performance characteristics determined by the reporting laboratory.  This laboratory is certified under the Clinical Laboratory Improvement Amendments CLIA as qualified to perform high complexity clinical laboratory testing.    PIP/TAZO Value in next row Sensitive ug/mL     16 SENSITIVEThis is a modified FDA-approved test that has been validated and its performance characteristics determined by the reporting laboratory.  This laboratory is certified under the Clinical Laboratory Improvement Amendments CLIA as qualified to perform high complexity clinical laboratory testing.    CEFEPIME  Value in next row Sensitive      16 SENSITIVEThis is a modified FDA-approved test that has been validated and its performance characteristics determined by the reporting laboratory.  This laboratory is certified under the Clinical Laboratory Improvement Amendments CLIA as qualified to perform high complexity clinical laboratory testing.    CEFTAZIDIME/AVIBACTAM Value in next row Sensitive ug/mL     16 SENSITIVEThis is a modified FDA-approved test that has been validated and its performance characteristics determined by the reporting laboratory.  This laboratory is certified under the Clinical Laboratory Improvement Amendments CLIA as qualified to perform high complexity clinical laboratory testing.    CEFTOLOZANE/TAZOBACTAM Value in next row Sensitive ug/mL     16 SENSITIVEThis is a modified FDA-approved test that has been  validated and its performance characteristics determined by the reporting laboratory.  This laboratory is certified under the Clinical Laboratory Improvement Amendments CLIA as qualified to perform high complexity clinical laboratory testing.    TOBRAMYCIN Value in next row Sensitive      16 SENSITIVEThis is a modified FDA-approved test that has been validated and its performance characteristics determined by the reporting laboratory.  This laboratory is certified under the Clinical Laboratory Improvement Amendments CLIA as qualified to perform high  complexity clinical laboratory testing.    CEFTAZIDIME Value in next row Sensitive      16 SENSITIVEThis is a modified FDA-approved test that has been validated and its performance characteristics determined by the reporting laboratory.  This laboratory is certified under the Clinical Laboratory Improvement Amendments CLIA as qualified to perform high complexity clinical laboratory testing.    * RARE PSEUDOMONAS AERUGINOSA  Culture, blood (Routine X 2) w Reflex to ID Panel     Status: None (Preliminary result)   Collection Time: 03/30/24 11:44 AM   Specimen: BLOOD LEFT HAND  Result Value Ref Range Status   Specimen Description BLOOD LEFT HAND  Final   Special Requests   Final    BOTTLES DRAWN AEROBIC ONLY Blood Culture results may not be optimal due to an inadequate volume of blood received in culture bottles   Culture   Final    NO GROWTH 3 DAYS Performed at [facility], [address], [place], [name] [address]    Report Status PENDING  Incomplete  Culture, blood (Routine X 2) w Reflex to ID Panel     Status: None (Preliminary result)   Collection Time: 03/30/24 11:44 AM   Specimen: BLOOD LEFT HAND  Result Value Ref Range Status   Specimen Description BLOOD LEFT HAND  Final   Special Requests   Final    BOTTLES DRAWN AEROBIC AND ANAEROBIC Blood Culture results may not be optimal due to an inadequate volume of blood received in culture  bottles   Culture   Final    NO GROWTH 3 DAYS Performed at [facility], [address] [place], [name] [address]    Report Status PENDING  Incomplete  Remove and replace urinary cath (placed > 5 days) then obtain urine culture from new indwelling urinary catheter.     Status: Abnormal   Collection Time: 03/30/24  4:51 PM   Specimen: Urine, Catheterized  Result Value Ref Range Status   Specimen Description URINE, CATHETERIZED  Final   Special Requests   Final    NONE Performed at [facility], [address] [place], [name] [address]    Culture 80,000 COLONIES/mL PSEUDOMONAS AERUGINOSA (A)  Final   Report Status 04/01/2024 FINAL  Final   Organism ID, Bacteria PSEUDOMONAS AERUGINOSA (A)  Final      Susceptibility   Pseudomonas aeruginosa - MIC*    MEROPENEM <=0.25 SENSITIVE Sensitive     CIPROFLOXACIN  0.12 SENSITIVE Sensitive     IMIPENEM 1 SENSITIVE Sensitive     PIP/TAZO Value in next row Sensitive ug/mL     <=4 SENSITIVEThis is a modified FDA-approved test that has been validated and its performance characteristics determined by the reporting laboratory.  This laboratory is certified under the Clinical Laboratory Improvement Amendments CLIA as qualified to perform high complexity clinical laboratory testing.    CEFEPIME  Value in next row Sensitive      <=4 SENSITIVEThis is a modified FDA-approved test that has been validated and its performance characteristics determined by the reporting laboratory.  This laboratory is certified under the Clinical Laboratory Improvement Amendments CLIA as qualified to perform high complexity clinical laboratory testing.    CEFTAZIDIME/AVIBACTAM Value in next row Sensitive ug/mL     <=4 SENSITIVEThis is a modified FDA-approved test that has been validated and its performance characteristics determined by the reporting laboratory.  This laboratory is certified under the Clinical Laboratory Improvement Amendments CLIA as qualified to  perform high complexity clinical laboratory  testing.    CEFTOLOZANE/TAZOBACTAM Value in next row Sensitive ug/mL     <=4 SENSITIVEThis is a modified FDA-approved test that has been validated and its performance characteristics determined by the reporting laboratory.  This laboratory is certified under the Clinical Laboratory Improvement Amendments CLIA as qualified to perform high complexity clinical laboratory testing.    TOBRAMYCIN Value in next row Sensitive      <=4 SENSITIVEThis is a modified FDA-approved test that has been validated and its performance characteristics determined by the reporting laboratory.  This laboratory is certified under the Clinical Laboratory Improvement Amendments CLIA as qualified to perform high complexity clinical laboratory testing.    CEFTAZIDIME Value in next row Sensitive      <=4 SENSITIVEThis is a modified FDA-approved test that has been validated and its performance characteristics determined by the reporting laboratory.  This laboratory is certified under the Clinical Laboratory Improvement Amendments CLIA as qualified to perform high complexity clinical laboratory testing.    * 80,000 COLONIES/mL PSEUDOMONAS AERUGINOSA     Radiology Studies: VAS US  LOWER EXTREMITY VENOUS (DVT) Result Date: 04/01/2024  Lower Venous DVT Study Patient Name:  Euel Szalkowski  Date of Exam:   04/01/2024 Medical Rec #: [id number]           Accession #:    [id number] Date of Birth: October 24, 1975          Patient Gender: M Patient Age:   46 years Exam Location:  [facility] Procedure:      VAS US  LOWER EXTREMITY VENOUS (DVT) Referring Phys: LONNI DALTON --------------------------------------------------------------------------------  Indications: Swelling, and Edema.  Limitations: Pt is sedated and is unable to position leg for optimal imaging. Performing Technologist: Elmarie Lindau, RVT  Examination Guidelines: A complete evaluation includes B-mode imaging, spectral  Doppler, color Doppler, and power Doppler as needed of all accessible portions of each vessel. Bilateral testing is considered an integral part of a complete examination. Limited examinations for reoccurring indications may be performed as noted. The reflux portion of the exam is performed with the patient in reverse Trendelenburg.  +---------+---------------+---------+-----------+----------+-------------------+ RIGHT    CompressibilityPhasicitySpontaneityPropertiesThrombus Aging      +---------+---------------+---------+-----------+----------+-------------------+ CFV      Full           Yes      Yes                                      +---------+---------------+---------+-----------+----------+-------------------+ SFJ      Full                                                             +---------+---------------+---------+-----------+----------+-------------------+ FV Prox  Full                                                             +---------+---------------+---------+-----------+----------+-------------------+ FV Mid   Full                                                             +---------+---------------+---------+-----------+----------+-------------------+  FV DistalFull                                                             +---------+---------------+---------+-----------+----------+-------------------+ PFV      Full                                                             +---------+---------------+---------+-----------+----------+-------------------+ POP      Full           Yes      Yes                                      +---------+---------------+---------+-----------+----------+-------------------+ PTV                                                   Not well visualized +---------+---------------+---------+-----------+----------+-------------------+ PERO                                                  Not well  visualized +---------+---------------+---------+-----------+----------+-------------------+   +---------+---------------+---------+-----------+----------+-------------------+ LEFT     CompressibilityPhasicitySpontaneityPropertiesThrombus Aging      +---------+---------------+---------+-----------+----------+-------------------+ CFV      Full           Yes      Yes                                      +---------+---------------+---------+-----------+----------+-------------------+ SFJ      Full                                                             +---------+---------------+---------+-----------+----------+-------------------+ FV Prox  Full                                                             +---------+---------------+---------+-----------+----------+-------------------+ FV Mid   Full                                                             +---------+---------------+---------+-----------+----------+-------------------+ FV DistalFull                                                             +---------+---------------+---------+-----------+----------+-------------------+  PFV      Full                                                             +---------+---------------+---------+-----------+----------+-------------------+ POP      Full           Yes      Yes                                      +---------+---------------+---------+-----------+----------+-------------------+ PTV                                                   Not well visualized +---------+---------------+---------+-----------+----------+-------------------+ PERO                                                  Not well visualized +---------+---------------+---------+-----------+----------+-------------------+     Summary: RIGHT: - There is no evidence of deep vein thrombosis in the lower extremity. However, portions of this examination were limited- see  technologist comments above.  - No cystic structure found in the popliteal fossa.  LEFT: - There is no evidence of deep vein thrombosis in the lower extremity. However, portions of this examination were limited- see technologist comments above.  - No cystic structure found in the popliteal fossa.  *See table(s) above for measurements and observations. Electronically signed by Penne Colorado MD on 04/01/2024 at 10:05:58 PM.    Final      Nilda Fendt, MD, PhD [facility]  Between 7 am - 7 pm I am available, please contact me via Amion (for emergencies) or Securechat (non urgent messages)  Between 7 pm - 7 am I am not available, please contact night coverage MD/APP via Amion

## 2024-04-02 NOTE — Plan of Care (Signed)
 Problem: Intracerebral Hemorrhage Tissue Perfusion: Goal: Complications of Intracerebral Hemorrhage will be minimized 04/02/2024 1648 by Rennie Laneta BROCKS, RN Outcome: Progressing 04/02/2024 1648 by Rennie Laneta BROCKS, RN Outcome: Progressing   Problem: Nutrition: Goal: Risk of aspiration will decrease 04/02/2024 1648 by Rennie Laneta BROCKS, RN Outcome: Progressing 04/02/2024 1648 by Rennie Laneta BROCKS, RN Outcome: Progressing Goal: Dietary intake will improve 04/02/2024 1648 by Rennie Laneta BROCKS, RN Outcome: Progressing 04/02/2024 1648 by Rennie Laneta BROCKS, RN Outcome: Progressing   Problem: Fluid Volume: Goal: Ability to maintain a balanced intake and output will improve 04/02/2024 1648 by Rennie Laneta BROCKS, RN Outcome: Progressing 04/02/2024 1648 by Rennie Laneta BROCKS, RN Outcome: Progressing   Problem: Skin Integrity: Goal: Risk for impaired skin integrity will decrease 04/02/2024 1648 by Rennie Laneta BROCKS, RN Outcome: Progressing 04/02/2024 1648 by Rennie Laneta BROCKS, RN Outcome: Progressing   Problem: Tissue Perfusion: Goal: Adequacy of tissue perfusion will improve 04/02/2024 1648 by Rennie Laneta BROCKS, RN Outcome: Progressing 04/02/2024 1648 by Rennie Laneta BROCKS, RN Outcome: Progressing   Problem: Clinical Measurements: Goal: Ability to maintain clinical measurements within normal limits will improve 04/02/2024 1648 by Rennie Laneta BROCKS, RN Outcome: Progressing 04/02/2024 1648 by Rennie Laneta BROCKS, RN Outcome: Progressing Goal: Will remain free from infection 04/02/2024 1648 by Rennie Laneta BROCKS, RN Outcome: Progressing 04/02/2024 1648 by Rennie Laneta BROCKS, RN Outcome: Progressing Goal: Diagnostic test results will improve 04/02/2024 1648 by Rennie Laneta BROCKS, RN Outcome: Progressing 04/02/2024 1648 by Rennie Laneta BROCKS, RN Outcome: Progressing Goal: Respiratory complications will improve 04/02/2024 1648 by Rennie Laneta BROCKS, RN Outcome: Progressing 04/02/2024 1648 by  Rennie Laneta BROCKS, RN Outcome: Progressing Goal: Cardiovascular complication will be avoided 04/02/2024 1648 by Rennie Laneta BROCKS, RN Outcome: Progressing 04/02/2024 1648 by Rennie Laneta BROCKS, RN Outcome: Progressing   Problem: Activity: Goal: Risk for activity intolerance will decrease 04/02/2024 1648 by Rennie Laneta BROCKS, RN Outcome: Progressing 04/02/2024 1648 by Rennie Laneta BROCKS, RN Outcome: Progressing   Problem: Nutrition: Goal: Adequate nutrition will be maintained 04/02/2024 1648 by Rennie Laneta BROCKS, RN Outcome: Progressing 04/02/2024 1648 by Rennie Laneta BROCKS, RN Outcome: Progressing   Problem: Coping: Goal: Level of anxiety will decrease 04/02/2024 1648 by Rennie Laneta BROCKS, RN Outcome: Progressing 04/02/2024 1648 by Rennie Laneta BROCKS, RN Outcome: Progressing   Problem: Elimination: Goal: Will not experience complications related to bowel motility 04/02/2024 1648 by Rennie Laneta BROCKS, RN Outcome: Progressing 04/02/2024 1648 by Rennie Laneta BROCKS, RN Outcome: Progressing Goal: Will not experience complications related to urinary retention 04/02/2024 1648 by Rennie Laneta BROCKS, RN Outcome: Progressing 04/02/2024 1648 by Rennie Laneta BROCKS, RN Outcome: Progressing   Problem: Pain Managment: Goal: General experience of comfort will improve and/or be controlled 04/02/2024 1648 by Rennie Laneta BROCKS, RN Outcome: Progressing 04/02/2024 1648 by Rennie Laneta BROCKS, RN Outcome: Progressing   Problem: Safety: Goal: Ability to remain free from injury will improve 04/02/2024 1648 by Rennie Laneta BROCKS, RN Outcome: Progressing 04/02/2024 1648 by Rennie Laneta BROCKS, RN Outcome: Progressing   Problem: Skin Integrity: Goal: Risk for impaired skin integrity will decrease 04/02/2024 1648 by Rennie Laneta BROCKS, RN Outcome: Progressing 04/02/2024 1648 by Rennie Laneta BROCKS, RN Outcome: Progressing   Problem: Activity: Goal: Ability to tolerate increased activity will improve 04/02/2024  1648 by Rennie Laneta BROCKS, RN Outcome: Progressing 04/02/2024 1648 by Rennie Laneta BROCKS, RN Outcome: Progressing   Problem: Respiratory: Goal: Patent airway maintenance will improve 04/02/2024 1648 by Rennie Laneta BROCKS, RN Outcome: Progressing 04/02/2024 1648 by Warden, Doryan Bahl  C, RN Outcome: Progressing   Problem: Activity: Goal: Ability to tolerate increased activity will improve 04/02/2024 1648 by Rennie Laneta BROCKS, RN Outcome: Progressing 04/02/2024 1648 by Rennie Laneta BROCKS, RN Outcome: Progressing   Problem: Education: Goal: Knowledge of disease or condition will improve 04/02/2024 1648 by Rennie Laneta BROCKS, RN Outcome: Progressing 04/02/2024 1648 by Rennie Laneta BROCKS, RN Outcome: Progressing Goal: Knowledge of secondary prevention will improve (MUST DOCUMENT ALL) 04/02/2024 1648 by Rennie Laneta BROCKS, RN Outcome: Progressing 04/02/2024 1648 by Rennie Laneta BROCKS, RN Outcome: Progressing Goal: Knowledge of patient specific risk factors will improve (DELETE if not current risk factor) 04/02/2024 1648 by Rennie Laneta BROCKS, RN Outcome: Progressing 04/02/2024 1648 by Rennie Laneta BROCKS, RN Outcome: Progressing   Problem: Intracerebral Hemorrhage Tissue Perfusion: Goal: Complications of Intracerebral Hemorrhage will be minimized 04/02/2024 1648 by Rennie Laneta BROCKS, RN Outcome: Progressing 04/02/2024 1648 by Rennie Laneta BROCKS, RN Outcome: Progressing   Problem: Coping: Goal: Will verbalize positive feelings about self 04/02/2024 1648 by Rennie Laneta BROCKS, RN Outcome: Progressing 04/02/2024 1648 by Rennie Laneta BROCKS, RN Outcome: Progressing Goal: Will identify appropriate support needs 04/02/2024 1648 by Rennie Laneta BROCKS, RN Outcome: Progressing 04/02/2024 1648 by Rennie Laneta BROCKS, RN Outcome: Progressing   Problem: Health Behavior/Discharge Planning: Goal: Ability to manage health-related needs will improve 04/02/2024 1648 by Rennie Laneta BROCKS, RN Outcome:  Progressing 04/02/2024 1648 by Rennie Laneta BROCKS, RN Outcome: Progressing Goal: Goals will be collaboratively established with patient/family 04/02/2024 1648 by Rennie Laneta BROCKS, RN Outcome: Progressing 04/02/2024 1648 by Rennie Laneta BROCKS, RN Outcome: Progressing   Problem: Self-Care: Goal: Ability to participate in self-care as condition permits will improve 04/02/2024 1648 by Rennie Laneta BROCKS, RN Outcome: Progressing 04/02/2024 1648 by Rennie Laneta BROCKS, RN Outcome: Progressing Goal: Verbalization of feelings and concerns over difficulty with self-care will improve 04/02/2024 1648 by Rennie Laneta BROCKS, RN Outcome: Progressing 04/02/2024 1648 by Rennie Laneta BROCKS, RN Outcome: Progressing Goal: Ability to communicate needs accurately will improve 04/02/2024 1648 by Rennie Laneta BROCKS, RN Outcome: Progressing 04/02/2024 1648 by Rennie Laneta BROCKS, RN Outcome: Progressing   Problem: Nutrition: Goal: Risk of aspiration will decrease 04/02/2024 1648 by Rennie Laneta BROCKS, RN Outcome: Progressing 04/02/2024 1648 by Rennie Laneta BROCKS, RN Outcome: Progressing Goal: Dietary intake will improve 04/02/2024 1648 by Rennie Laneta BROCKS, RN Outcome: Progressing 04/02/2024 1648 by Rennie Laneta BROCKS, RN Outcome: Progressing   Problem: Education: Goal: Knowledge of disease or condition will improve 04/02/2024 1648 by Rennie Laneta BROCKS, RN Outcome: Progressing 04/02/2024 1648 by Rennie Laneta BROCKS, RN Outcome: Progressing Goal: Knowledge of secondary prevention will improve (MUST DOCUMENT ALL) 04/02/2024 1648 by Rennie Laneta BROCKS, RN Outcome: Progressing 04/02/2024 1648 by Rennie Laneta BROCKS, RN Outcome: Progressing Goal: Knowledge of patient specific risk factors will improve (DELETE if not current risk factor) 04/02/2024 1648 by Rennie Laneta BROCKS, RN Outcome: Progressing 04/02/2024 1648 by Rennie Laneta BROCKS, RN Outcome: Progressing   Problem: Intracerebral Hemorrhage Tissue Perfusion: Goal:  Complications of Intracerebral Hemorrhage will be minimized 04/02/2024 1648 by Rennie Laneta BROCKS, RN Outcome: Progressing 04/02/2024 1648 by Rennie Laneta BROCKS, RN Outcome: Progressing   Problem: Coping: Goal: Will verbalize positive feelings about self 04/02/2024 1648 by Rennie Laneta BROCKS, RN Outcome: Progressing 04/02/2024 1648 by Rennie Laneta BROCKS, RN Outcome: Progressing Goal: Will identify appropriate support needs 04/02/2024 1648 by Rennie Laneta BROCKS, RN Outcome: Progressing 04/02/2024 1648 by Rennie Laneta BROCKS, RN Outcome: Progressing   Problem: Health Behavior/Discharge Planning: Goal: Ability to manage health-related needs  will improve 04/02/2024 1648 by Rennie Laneta BROCKS, RN Outcome: Progressing 04/02/2024 1648 by Rennie Laneta BROCKS, RN Outcome: Progressing Goal: Goals will be collaboratively established with patient/family 04/02/2024 1648 by Rennie Laneta BROCKS, RN Outcome: Progressing 04/02/2024 1648 by Rennie Laneta BROCKS, RN Outcome: Progressing   Problem: Self-Care: Goal: Ability to participate in self-care as condition permits will improve 04/02/2024 1648 by Rennie Laneta BROCKS, RN Outcome: Progressing 04/02/2024 1648 by Rennie Laneta BROCKS, RN Outcome: Progressing Goal: Verbalization of feelings and concerns over difficulty with self-care will improve 04/02/2024 1648 by Rennie Laneta BROCKS, RN Outcome: Progressing 04/02/2024 1648 by Rennie Laneta BROCKS, RN Outcome: Progressing Goal: Ability to communicate needs accurately will improve 04/02/2024 1648 by Rennie Laneta BROCKS, RN Outcome: Progressing 04/02/2024 1648 by Rennie Laneta BROCKS, RN Outcome: Progressing   Problem: Nutrition: Goal: Risk of aspiration will decrease 04/02/2024 1648 by Rennie Laneta BROCKS, RN Outcome: Progressing 04/02/2024 1648 by Rennie Laneta BROCKS, RN Outcome: Progressing Goal: Dietary intake will improve 04/02/2024 1648 by Rennie Laneta BROCKS, RN Outcome: Progressing 04/02/2024 1648 by Rennie Laneta BROCKS,  RN Outcome: Progressing

## 2024-04-02 NOTE — Plan of Care (Signed)
 Problem: Intracerebral Hemorrhage Tissue Perfusion: Goal: Complications of Intracerebral Hemorrhage will be minimized Outcome: Progressing   Problem: Nutrition: Goal: Risk of aspiration will decrease Outcome: Progressing Goal: Dietary intake will improve Outcome: Progressing   Problem: Fluid Volume: Goal: Ability to maintain a balanced intake and output will improve 04/02/2024 1648 by Rennie Laneta BROCKS, RN Outcome: Progressing 04/02/2024 1648 by Rennie Laneta BROCKS, RN Outcome: Progressing   Problem: Skin Integrity: Goal: Risk for impaired skin integrity will decrease 04/02/2024 1648 by Rennie Laneta BROCKS, RN Outcome: Progressing 04/02/2024 1648 by Rennie Laneta BROCKS, RN Outcome: Progressing   Problem: Tissue Perfusion: Goal: Adequacy of tissue perfusion will improve 04/02/2024 1648 by Rennie Laneta BROCKS, RN Outcome: Progressing 04/02/2024 1648 by Rennie Laneta BROCKS, RN Outcome: Progressing   Problem: Clinical Measurements: Goal: Ability to maintain clinical measurements within normal limits will improve 04/02/2024 1648 by Rennie Laneta BROCKS, RN Outcome: Progressing 04/02/2024 1648 by Rennie Laneta BROCKS, RN Outcome: Progressing Goal: Will remain free from infection 04/02/2024 1648 by Rennie Laneta BROCKS, RN Outcome: Progressing 04/02/2024 1648 by Rennie Laneta BROCKS, RN Outcome: Progressing Goal: Diagnostic test results will improve 04/02/2024 1648 by Rennie Laneta BROCKS, RN Outcome: Progressing 04/02/2024 1648 by Rennie Laneta BROCKS, RN Outcome: Progressing Goal: Respiratory complications will improve 04/02/2024 1648 by Rennie Laneta BROCKS, RN Outcome: Progressing 04/02/2024 1648 by Rennie Laneta BROCKS, RN Outcome: Progressing Goal: Cardiovascular complication will be avoided 04/02/2024 1648 by Rennie Laneta BROCKS, RN Outcome: Progressing 04/02/2024 1648 by Rennie Laneta BROCKS, RN Outcome: Progressing   Problem: Activity: Goal: Risk for activity intolerance will decrease 04/02/2024 1648 by  Rennie Laneta BROCKS, RN Outcome: Progressing 04/02/2024 1648 by Rennie Laneta BROCKS, RN Outcome: Progressing   Problem: Nutrition: Goal: Adequate nutrition will be maintained 04/02/2024 1648 by Rennie Laneta BROCKS, RN Outcome: Progressing 04/02/2024 1648 by Rennie Laneta BROCKS, RN Outcome: Progressing   Problem: Coping: Goal: Level of anxiety will decrease 04/02/2024 1648 by Rennie Laneta BROCKS, RN Outcome: Progressing 04/02/2024 1648 by Rennie Laneta BROCKS, RN Outcome: Progressing   Problem: Elimination: Goal: Will not experience complications related to bowel motility 04/02/2024 1648 by Rennie Laneta BROCKS, RN Outcome: Progressing 04/02/2024 1648 by Rennie Laneta BROCKS, RN Outcome: Progressing Goal: Will not experience complications related to urinary retention 04/02/2024 1648 by Rennie Laneta BROCKS, RN Outcome: Progressing 04/02/2024 1648 by Rennie Laneta BROCKS, RN Outcome: Progressing   Problem: Pain Managment: Goal: General experience of comfort will improve and/or be controlled 04/02/2024 1648 by Rennie Laneta BROCKS, RN Outcome: Progressing 04/02/2024 1648 by Rennie Laneta BROCKS, RN Outcome: Progressing   Problem: Safety: Goal: Ability to remain free from injury will improve 04/02/2024 1648 by Rennie Laneta BROCKS, RN Outcome: Progressing 04/02/2024 1648 by Rennie Laneta BROCKS, RN Outcome: Progressing   Problem: Skin Integrity: Goal: Risk for impaired skin integrity will decrease 04/02/2024 1648 by Rennie Laneta BROCKS, RN Outcome: Progressing 04/02/2024 1648 by Rennie Laneta BROCKS, RN Outcome: Progressing   Problem: Activity: Goal: Ability to tolerate increased activity will improve 04/02/2024 1648 by Rennie Laneta BROCKS, RN Outcome: Progressing 04/02/2024 1648 by Rennie Laneta BROCKS, RN Outcome: Progressing   Problem: Respiratory: Goal: Patent airway maintenance will improve 04/02/2024 1648 by Rennie Laneta BROCKS, RN Outcome: Progressing 04/02/2024 1648 by Rennie Laneta BROCKS, RN Outcome: Progressing    Problem: Activity: Goal: Ability to tolerate increased activity will improve 04/02/2024 1648 by Rennie Laneta BROCKS, RN Outcome: Progressing 04/02/2024 1648 by Rennie Laneta BROCKS, RN Outcome: Progressing   Problem: Education: Goal: Knowledge of disease or condition will improve 04/02/2024 1648  by Rennie Laneta BROCKS, RN Outcome: Progressing 04/02/2024 1648 by Rennie Laneta BROCKS, RN Outcome: Progressing Goal: Knowledge of secondary prevention will improve (MUST DOCUMENT ALL) 04/02/2024 1648 by Rennie Laneta BROCKS, RN Outcome: Progressing 04/02/2024 1648 by Rennie Laneta BROCKS, RN Outcome: Progressing Goal: Knowledge of patient specific risk factors will improve (DELETE if not current risk factor) 04/02/2024 1648 by Rennie Laneta BROCKS, RN Outcome: Progressing 04/02/2024 1648 by Rennie Laneta BROCKS, RN Outcome: Progressing   Problem: Intracerebral Hemorrhage Tissue Perfusion: Goal: Complications of Intracerebral Hemorrhage will be minimized 04/02/2024 1648 by Rennie Laneta BROCKS, RN Outcome: Progressing 04/02/2024 1648 by Rennie Laneta BROCKS, RN Outcome: Progressing   Problem: Coping: Goal: Will verbalize positive feelings about self 04/02/2024 1648 by Rennie Laneta BROCKS, RN Outcome: Progressing 04/02/2024 1648 by Rennie Laneta BROCKS, RN Outcome: Progressing Goal: Will identify appropriate support needs 04/02/2024 1648 by Rennie Laneta BROCKS, RN Outcome: Progressing 04/02/2024 1648 by Rennie Laneta BROCKS, RN Outcome: Progressing   Problem: Health Behavior/Discharge Planning: Goal: Ability to manage health-related needs will improve 04/02/2024 1648 by Rennie Laneta BROCKS, RN Outcome: Progressing 04/02/2024 1648 by Rennie Laneta BROCKS, RN Outcome: Progressing Goal: Goals will be collaboratively established with patient/family 04/02/2024 1648 by Rennie Laneta BROCKS, RN Outcome: Progressing 04/02/2024 1648 by Rennie Laneta BROCKS, RN Outcome: Progressing   Problem: Self-Care: Goal: Ability to participate in  self-care as condition permits will improve 04/02/2024 1648 by Rennie Laneta BROCKS, RN Outcome: Progressing 04/02/2024 1648 by Rennie Laneta BROCKS, RN Outcome: Progressing Goal: Verbalization of feelings and concerns over difficulty with self-care will improve 04/02/2024 1648 by Rennie Laneta BROCKS, RN Outcome: Progressing 04/02/2024 1648 by Rennie Laneta BROCKS, RN Outcome: Progressing Goal: Ability to communicate needs accurately will improve 04/02/2024 1648 by Rennie Laneta BROCKS, RN Outcome: Progressing 04/02/2024 1648 by Rennie Laneta BROCKS, RN Outcome: Progressing   Problem: Nutrition: Goal: Risk of aspiration will decrease 04/02/2024 1648 by Rennie Laneta BROCKS, RN Outcome: Progressing 04/02/2024 1648 by Rennie Laneta BROCKS, RN Outcome: Progressing Goal: Dietary intake will improve 04/02/2024 1648 by Rennie Laneta BROCKS, RN Outcome: Progressing 04/02/2024 1648 by Rennie Laneta BROCKS, RN Outcome: Progressing   Problem: Education: Goal: Knowledge of disease or condition will improve 04/02/2024 1648 by Rennie Laneta BROCKS, RN Outcome: Progressing 04/02/2024 1648 by Rennie Laneta BROCKS, RN Outcome: Progressing Goal: Knowledge of secondary prevention will improve (MUST DOCUMENT ALL) 04/02/2024 1648 by Rennie Laneta BROCKS, RN Outcome: Progressing 04/02/2024 1648 by Rennie Laneta BROCKS, RN Outcome: Progressing Goal: Knowledge of patient specific risk factors will improve (DELETE if not current risk factor) 04/02/2024 1648 by Rennie Laneta BROCKS, RN Outcome: Progressing 04/02/2024 1648 by Rennie Laneta BROCKS, RN Outcome: Progressing   Problem: Intracerebral Hemorrhage Tissue Perfusion: Goal: Complications of Intracerebral Hemorrhage will be minimized 04/02/2024 1648 by Rennie Laneta BROCKS, RN Outcome: Progressing 04/02/2024 1648 by Rennie Laneta BROCKS, RN Outcome: Progressing   Problem: Coping: Goal: Will verbalize positive feelings about self 04/02/2024 1648 by Rennie Laneta BROCKS, RN Outcome: Progressing 04/02/2024  1648 by Rennie Laneta BROCKS, RN Outcome: Progressing Goal: Will identify appropriate support needs 04/02/2024 1648 by Rennie Laneta BROCKS, RN Outcome: Progressing 04/02/2024 1648 by Rennie Laneta BROCKS, RN Outcome: Progressing   Problem: Health Behavior/Discharge Planning: Goal: Ability to manage health-related needs will improve 04/02/2024 1648 by Rennie Laneta BROCKS, RN Outcome: Progressing 04/02/2024 1648 by Rennie Laneta BROCKS, RN Outcome: Progressing Goal: Goals will be collaboratively established with patient/family 04/02/2024 1648 by Rennie Laneta BROCKS, RN Outcome: Progressing 04/02/2024 1648 by Rennie Laneta BROCKS, RN Outcome: Progressing  Problem: Self-Care: Goal: Ability to participate in self-care as condition permits will improve 04/02/2024 1648 by Rennie Laneta BROCKS, RN Outcome: Progressing 04/02/2024 1648 by Rennie Laneta BROCKS, RN Outcome: Progressing Goal: Verbalization of feelings and concerns over difficulty with self-care will improve 04/02/2024 1648 by Rennie Laneta BROCKS, RN Outcome: Progressing 04/02/2024 1648 by Rennie Laneta BROCKS, RN Outcome: Progressing Goal: Ability to communicate needs accurately will improve 04/02/2024 1648 by Rennie Laneta BROCKS, RN Outcome: Progressing 04/02/2024 1648 by Rennie Laneta BROCKS, RN Outcome: Progressing   Problem: Nutrition: Goal: Risk of aspiration will decrease 04/02/2024 1648 by Rennie Laneta BROCKS, RN Outcome: Progressing 04/02/2024 1648 by Rennie Laneta BROCKS, RN Outcome: Progressing Goal: Dietary intake will improve 04/02/2024 1648 by Rennie Laneta BROCKS, RN Outcome: Progressing 04/02/2024 1648 by Rennie Laneta BROCKS, RN Outcome: Progressing

## 2024-04-02 NOTE — Progress Notes (Signed)
 Physical Therapy Treatment Patient Details Name: Garrett [name] MRN: [id number] DOB: 11/17/1975 Today's Date: 04/02/2024   History of Present Illness Pt is a 48 yo male who was found down 10/27/23 with agonal breathing. 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 Intubated 3/8, cortrak placed 3/14, trach placed 3/22, PEG placed 3/24. Reintubated 02/25/2024 and trach 7/16.  PMH: HTN, smoker    PT Comments  Started therapy with attempts at L hand squeezing pt is only able to achieve one very weak squeeze. 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 Pt seemed more fatigued afterward. With bed in chair position and total Ax2 for anterior lean, PT able to perform limited cervical PROM activities. Pt eyes closed so ended session. D/c plans remain appropriate. PT will continue to follow acutely.    If plan is discharge home, recommend the following: Two people to help with walking and/or transfers;Two people to help with bathing/dressing/bathroom;Assistance with cooking/housework;Assistance with feeding;Direct supervision/assist for medications management;Direct supervision/assist for financial management;Assist for transportation;Help with stairs or ramp for entrance;Supervision due to cognitive status   Can travel by private vehicle      No  Equipment Recommendations  Hoyer lift;Hospital bed;Wheelchair (measurements PT);Wheelchair cushion (measurements PT);BSC/3in1;Other (comment)       Precautions / Restrictions Precautions Precautions: Fall Recall of Precautions/Restrictions: Impaired Precaution/Restrictions Comments: PEG, trach SBP < 160 Splint/Cast: Bil resting hand splints Restrictions Weight Bearing Restrictions Per Provider Order: No     Mobility  Bed Mobility                General bed mobility comments: Initiated bed into chair position, to ~70 degrees, and provide forward flexion with assist x2,    Transfers                   General transfer comment: pt would not be able to participate with transfers at this time.       Modified Rankin (Stroke Patients Only) Modified Rankin (Stroke Patients Only) Pre-Morbid Rankin Score: No symptoms Modified Rankin: Severe disability     Balance Overall balance assessment: Needs assistance Sitting-balance support: Feet unsupported, No upper extremity supported Sitting balance-Leahy Scale: Zero Sitting balance - Comments: pt is total A +2 for bringing torso forward from the bed surface with bed in chair position Postural control: Posterior lean                                  Communication Communication Communication: Impaired Factors Affecting Communication: Trach/intubated  Cognition Arousal: Alert Behavior During Therapy: Flat affect   PT - Cognitive impairments: Difficult to assess Difficult to assess due to: Impaired communication                     PT - Cognition Comments: pt able to track a little with his R eye, L eye has been sutured closed, able to respond only to command for squeezing hand, does not use had squeeze to answer questions Following commands: Impaired Following commands impaired: Follows one step commands inconsistently    Cueing Cueing Techniques: Verbal cues, Tactile cues, Visual cues, Gestural cues  Exercises General Exercises - Lower Extremity Ankle Circles/Pumps: PROM, Supine, Both, 10 reps Other Exercises Other Exercises: PROM cervical retraction, bilateral cervical lateral flexion and  bilateral cervical retraction    General Comments General comments (skin integrity, edema, etc.): VSS on Trach Collar, Eye doctor in room to assess L eye during session      Pertinent Vitals/Pain Pain Assessment Pain Assessment: Faces Faces Pain  Scale: Hurts a little bit Pain Location: slight grimace with cervical stretching Pain Descriptors / Indicators: Grimacing Pain Intervention(s): Limited activity within patient's tolerance, Monitored during session, Repositioned     PT Goals (current goals can now be found in the care plan section) Acute Rehab PT Goals PT Goal Formulation: Patient unable to participate in goal setting Time For Goal Achievement: 03/26/24 Potential to Achieve Goals: Poor Progress towards PT goals: Not progressing toward goals - comment    Frequency    Min 1X/week       AM-PAC PT 6 Clicks Mobility   Outcome Measure  Help needed turning from your back to your side while in a flat bed without using bedrails?: Total Help needed moving from lying on your back to sitting on the side of a flat bed without using bedrails?: Total Help needed moving to and from a bed to a chair (including a wheelchair)?: Total Help needed standing up from a chair using your arms (e.g., wheelchair or bedside chair)?: Total Help needed to walk in hospital room?: Total Help needed climbing 3-5 steps with a railing? : Total 6 Click Score: 6    End of Session Equipment Utilized During Treatment: Oxygen Activity Tolerance: Patient tolerated treatment well Patient left: in bed Nurse Communication: Mobility status PT Visit Diagnosis: Muscle weakness (generalized) (M62.81);Other symptoms and signs involving the nervous system (R29.898);Hemiplegia and hemiparesis Hemiplegia - Right/Left: Right Hemiplegia - dominant/non-dominant: Dominant Hemiplegia - caused by: Nontraumatic intracerebral hemorrhage     Time: 1235-1250 PT Time Calculation (min) (ACUTE ONLY): 15 min  Charges:    $Therapeutic Activity: 8-22 mins PT General Charges $$ ACUTE PT VISIT: 1 Visit                     Garrett [name] PT, DPT Acute Rehabilitation Services Please use secure chat or  Call Office [phone]    Garrett [name]  [facility] 04/02/2024, 3:27 PM

## 2024-04-03 DIAGNOSIS — I619 Nontraumatic intracerebral hemorrhage, unspecified: Secondary | ICD-10-CM | POA: Diagnosis not present

## 2024-04-03 LAB — CBC
HCT: 37 % — ABNORMAL LOW (ref 39.0–52.0)
Hemoglobin: 10.9 g/dL — ABNORMAL LOW (ref 13.0–17.0)
MCH: 26.9 pg (ref 26.0–34.0)
MCHC: 29.5 g/dL — ABNORMAL LOW (ref 30.0–36.0)
MCV: 91.4 fL (ref 80.0–100.0)
Platelets: 588 K/uL — ABNORMAL HIGH (ref 150–400)
RBC: 4.05 MIL/uL — ABNORMAL LOW (ref 4.22–5.81)
RDW: 15 % (ref 11.5–15.5)
WBC: 8.9 K/uL (ref 4.0–10.5)
nRBC: 0 % (ref 0.0–0.2)

## 2024-04-03 LAB — COMPREHENSIVE METABOLIC PANEL WITH GFR
ALT: 97 U/L — ABNORMAL HIGH (ref 0–44)
AST: 41 U/L (ref 15–41)
Albumin: 2.8 g/dL — ABNORMAL LOW (ref 3.5–5.0)
Alkaline Phosphatase: 58 U/L (ref 38–126)
Anion gap: 8 (ref 5–15)
BUN: 21 mg/dL — ABNORMAL HIGH (ref 6–20)
CO2: 31 mmol/L (ref 22–32)
Calcium: 9.1 mg/dL (ref 8.9–10.3)
Chloride: 99 mmol/L (ref 98–111)
Creatinine, Ser: 0.42 mg/dL — ABNORMAL LOW (ref 0.61–1.24)
GFR, Estimated: >60 mL/min (ref >60)
Glucose, Bld: 99 mg/dL (ref 70–99)
Potassium: 4 mmol/L (ref 3.5–5.1)
Sodium: 138 mmol/L (ref 135–145)
Total Bilirubin: 0.4 mg/dL (ref 0.0–1.2)
Total Protein: 7.6 g/dL (ref 6.5–8.1)

## 2024-04-03 LAB — MAGNESIUM: Magnesium: 2.2 mg/dL (ref 1.7–2.4)

## 2024-04-03 NOTE — Plan of Care (Signed)
  Problem: Intracerebral Hemorrhage Tissue Perfusion: Goal: Complications of Intracerebral Hemorrhage will be minimized Outcome: Progressing   Problem: Nutrition: Goal: Risk of aspiration will decrease Outcome: Progressing Goal: Dietary intake will improve Outcome: Progressing   Problem: Fluid Volume: Goal: Ability to maintain a balanced intake and output will improve Outcome: Progressing   Problem: Skin Integrity: Goal: Risk for impaired skin integrity will decrease Outcome: Progressing   Problem: Tissue Perfusion: Goal: Adequacy of tissue perfusion will improve Outcome: Progressing   Problem: Clinical Measurements: Goal: Ability to maintain clinical measurements within normal limits will improve Outcome: Progressing Goal: Will remain free from infection Outcome: Progressing Goal: Diagnostic test results will improve Outcome: Progressing Goal: Respiratory complications will improve Outcome: Progressing Goal: Cardiovascular complication will be avoided Outcome: Progressing   Problem: Activity: Goal: Risk for activity intolerance will decrease Outcome: Progressing   Problem: Nutrition: Goal: Adequate nutrition will be maintained Outcome: Progressing   Problem: Coping: Goal: Level of anxiety will decrease Outcome: Progressing   Problem: Elimination: Goal: Will not experience complications related to bowel motility Outcome: Progressing Goal: Will not experience complications related to urinary retention Outcome: Progressing   Problem: Pain Managment: Goal: General experience of comfort will improve and/or be controlled Outcome: Progressing   Problem: Safety: Goal: Ability to remain free from injury will improve Outcome: Progressing   Problem: Skin Integrity: Goal: Risk for impaired skin integrity will decrease Outcome: Progressing   Problem: Activity: Goal: Ability to tolerate increased activity will improve Outcome: Progressing   Problem:  Respiratory: Goal: Patent airway maintenance will improve Outcome: Progressing   Problem: Activity: Goal: Ability to tolerate increased activity will improve Outcome: Progressing   Problem: Education: Goal: Knowledge of disease or condition will improve Outcome: Progressing   Problem: Intracerebral Hemorrhage Tissue Perfusion: Goal: Complications of Intracerebral Hemorrhage will be minimized Outcome: Progressing   Problem: Self-Care: Goal: Ability to participate in self-care as condition permits will improve Outcome: Progressing Goal: Verbalization of feelings and concerns over difficulty with self-care will improve Outcome: Progressing Goal: Ability to communicate needs accurately will improve Outcome: Progressing   Problem: Nutrition: Goal: Risk of aspiration will decrease Outcome: Progressing Goal: Dietary intake will improve Outcome: Progressing   Problem: Education: Goal: Knowledge of disease or condition will improve Outcome: Progressing Goal: Knowledge of secondary prevention will improve (MUST DOCUMENT ALL) Outcome: Progressing Goal: Knowledge of patient specific risk factors will improve (DELETE if not current risk factor) Outcome: Progressing   Problem: Intracerebral Hemorrhage Tissue Perfusion: Goal: Complications of Intracerebral Hemorrhage will be minimized Outcome: Progressing   Problem: Coping: Goal: Will verbalize positive feelings about self Outcome: Progressing Goal: Will identify appropriate support needs Outcome: Progressing   Problem: Health Behavior/Discharge Planning: Goal: Ability to manage health-related needs will improve Outcome: Progressing Goal: Goals will be collaboratively established with patient/family Outcome: Progressing   Problem: Self-Care: Goal: Ability to participate in self-care as condition permits will improve Outcome: Progressing Goal: Verbalization of feelings and concerns over difficulty with self-care will  improve Outcome: Progressing Goal: Ability to communicate needs accurately will improve Outcome: Progressing   Problem: Nutrition: Goal: Risk of aspiration will decrease Outcome: Progressing Goal: Dietary intake will improve Outcome: Progressing

## 2024-04-03 NOTE — Progress Notes (Signed)
 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  He has had a prolonged hospitalization, admitted in March 2025.  Hospital course complicated by persistent vegetative state, recurrent pneumonias, now has a tracheostomy and a PEG tube  Significant events: 3/8: Intubated in ED, pontine bleed/SAH. CT head Acute large hemorrhage 4.1 cm in pons with intraventricular extension without hydrocephalus. 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  Restarted antibiotics.  Urine culture + E. Coli, foley catheter changed 5/18: Completed antibiotics for UTI 5/22: Transferred [facility] 6/11: De-cannulated 01/29/2024 6/27: Placed on 3 L oxygen overnight, tachypnea, increased secretion. Started IV Unasyn . CCM re-consulted.  7/7: Transferred back to ICU; Re-intubated 7/8: ENT consulted for trach 7/16: Trach replaced by ENT 7/17-8/5: Slow vent weaning with low success 8/6: Transferred back to hospitalist service still on vent 8/7: Not able to wean to trach collar 8/8: Only on trach collar a few hours 8/9: Only on trach collar briefly, then had to go back on  mechanical ventilation 8/11: Tolerating ATC 24 hours, Ophtho reconsulted, placed tarsorrhaphy    Significant studies: 3/8 CT head on admission: 4.1 cm hemorrhage centered on pons with intraventricular extension into the 4th ventricle and basal cisterns and small [facility] 3/9 CTA head and neck: no change in IPH and [facility] 3/17 CT head: interval decrease in IPH 3/29 CT head: evolving unremarkably 5/10 CT abdomen: Foley catheter through gastrostomy tract 5/15 US  abdomen: Unremarkable 7/19 CT head: Encephalomalacia at site of prior hemorrhage   Significant microbiology data: 4/1 trach aspirate: MRSA 4/8 trach aspirate: MRSA 5/13 foley catheter urine: E coli 5/13 blood cx x2: no growth 5/14 trach aspirate: MRSA 6/1 blood cx x2: no growth 6/2 trach aspirate: MRSA 6/27 sputum: MRSA, klebsiella 7/7 trach aspirate: MRSA, candida 8/10 Urine cx: PsA  8/10 Blood culture x2: NGTD 8/10 trach aspirate: E coli and PsA  Subjective / 24h Interval events: Unresponsive, no significant overnight events  Assesement and Plan: Principal Problem:   ICH (intracerebral hemorrhage) (HCC) Active Problems:   Advanced care planning/counseling discussion   Poor prognosis   Tracheostomy status (HCC)   Pressure injury of skin   Goals of care, counseling/discussion   Chronic respiratory failure with hypoxia (HCC)   Acute respiratory failure (HCC)   Chronic hypoxic respiratory failure (HCC)   Pontine hemorrhage (HCC)   Principal problem 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 Tobie -Now able to tolerate trach collar for the last few days.  Pulmonary following  intermittently for trach management.   Appreciate follow-up   Recurrent fever 8/11, suspect pseudomonal/E. coli tracheitis - New fevers in the last week, cultures from blood, urine, tracheal aspirate showed colonization with Pseudomonas, also E. coli on trachea.  Discussed with pulmonology, they recommended empiric treatment, probably for tracheitis, possible CXR-negative pneumonia. 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  Ophtho Dr. Waylan consulted on 8/11 and placed tarsorraphy on 8/11. - q2 moxifloxacin and moisturizing ointment -Ophthalmology will follow-up later this week, appreciate input   Hypertension -BP overall stable, continue amlodipine , metoprolol    Normocytic anemia - Stable without bleeding   Class III obesity - BMI 42, complicates care  Scheduled Meds:  amLODipine   5 mg Per Tube Daily   artificial tears   Right Eye Q2H   Chlorhexidine  Gluconate Cloth  6 each Topical Daily   docusate  100 mg Per Tube BID   enoxaparin  (LOVENOX ) injection  40 mg Subcutaneous Daily   famotidine   20 mg Per Tube BID   feeding supplement (JEVITY 1.5 CAL/FIBER)  237 mL Per Tube 5 X Daily   feeding supplement (PROSource TF20)  60 mL Per Tube TID   free water   200 mL Per Tube BID   gatifloxacin   1 drop Left Eye Q2H   metoprolol  tartrate  75 mg Per Tube BID   multivitamin with minerals  1 tablet Per Tube Daily   mouth rinse  15 mL Mouth Rinse Q2H   polyethylene glycol  17 g Per Tube BID   sodium chloride  flush  10-40 mL Intracatheter Q12H   Continuous Infusions:  ceFEPime  (MAXIPIME ) IV 2 g (04/03/24 0614)   PRN Meds:.acetaminophen  **OR** acetaminophen  (TYLENOL ) oral liquid 160 mg/5 mL **OR** acetaminophen , bisacodyl , Gerhardt's butt cream, hydrALAZINE , ipratropium-albuterol , mouth rinse, sodium chloride  flush, traZODone   Current Outpatient Medications  Medication Instructions    acetaminophen  (TYLENOL ) 1,000 mg, Oral, Every 6 hours PRN   ibuprofen (ADVIL) 600 mg, Oral, Every 6 hours PRN    Diet Orders (From admission, onward)     Start     Ordered   03/04/24 2359  Diet NPO time specified  Diet effective midnight        03/03/24 1402            DVT prophylaxis: enoxaparin  (LOVENOX ) injection 40 mg Start: 03/06/24 1000 SCDs Start: 10/27/23 2226   Lab Results  Component Value Date   PLT 588 (H) 04/03/2024      Code Status: Full Code  Family Communication: No family at bedside  Status is: Inpatient Remains inpatient appropriate because: Severity of illness  Level of care: Progressive  Consultants:  PCCM ENT Ophthalmology  Objective: Vitals:   04/03/24 0330 04/03/24 0339 04/03/24 0736 04/03/24 1127  BP:  (!) 151/97 (!) 152/96 (!) 145/94  Pulse: 77 71 88 71  Resp: 18 20 16 18   Temp:  98.3 F (36.8 C) 98.3 F (36.8 C) 98.2 F (36.8 C)  TempSrc:  Oral Oral Axillary  SpO2: 100% 99% 100% 96%  Weight:      Height:        Intake/Output Summary (Last 24 hours) at 04/03/2024 1139 Last data filed at 04/03/2024 1125 Gross per 24 hour  Intake 693.29 ml  Output 750 ml  Net -56.71 ml   Wt Readings from Last 3 Encounters:  04/01/24 128 kg    Examination:  Constitutional: NAD Eyes: lids and conjunctivae normal, no scleral icterus ENMT: mmm Neck: normal, supple Respiratory: clear to auscultation bilaterally, no wheezing, no crackles.  Cardiovascular: Regular rate and rhythm, no murmurs / rubs / gallops. Abdomen: soft, no distention, no tenderness. Bowel sounds positive.   Data Reviewed: I have independently reviewed following labs and imaging studies   CBC Recent Labs  Lab 03/28/24 0422 03/31/24 1744 04/02/24 0436 04/03/24 0344  WBC 8.2 8.1 8.5 8.9  HGB 9.4* 10.5* 10.6* 10.9*  HCT 31.8* 34.8* 35.7* 37.0*  PLT 172 556* 529* 588*  MCV 91.4 89.5 90.6 91.4  MCH 27.0 27.0 26.9 26.9  MCHC 29.6* 30.2 29.7* 29.5*  RDW 15.9* 15.3  15.2 15.0    Recent Labs  Lab 03/28/24 0422 03/31/24 1744 04/02/24 0436 04/03/24 0344  NA 144 137 140 138  K 4.1 3.9 4.5 4.0  CL 105 101 102 99  CO2 25 26 30 31   GLUCOSE 99 125* 99 99  BUN 29* 16 22* 21*  CREATININE 0.64 0.46* 0.37* 0.42*  CALCIUM 9.3 8.9 9.5 9.1  AST 40  --  44* 41  ALT 91*  --  98* 97*  ALKPHOS 57  --  57 58  BILITOT 0.4  --  0.2 0.4  ALBUMIN  2.8*  --  2.8* 2.8*  MG  --   --   --  2.2    ------------------------------------------------------------------------------------------------------------------ No results for input(s): CHOL, HDL, LDLCALC, TRIG, CHOLHDL, LDLDIRECT in the last 72 hours.  Lab Results  Component Value Date   HGBA1C 4.7 (L) 10/27/2023   ------------------------------------------------------------------------------------------------------------------ No results for input(s): TSH, T4TOTAL, T3FREE, THYROIDAB in the last 72 hours.  Invalid input(s): FREET3  Cardiac Enzymes No results for input(s): CKMB, TROPONINI, MYOGLOBIN in the last 168 hours.  Invalid input(s): CK ------------------------------------------------------------------------------------------------------------------    Component Value Date/Time   BNP 210.4 (H) 10/27/2023 1820    CBG: Recent Labs  Lab 03/30/24 0626 03/30/24 1107 03/30/24 2327 03/31/24 0327 03/31/24 0747  GLUCAP 94 104* 134* 105* 150*    Recent Results (from the past 240 hours)  MRSA Next Gen by PCR, Nasal     Status: Abnormal   Collection Time: 03/24/24  2:01 PM   Specimen: Nasal Mucosa; Nasal Swab  Result Value Ref Range Status   MRSA by PCR Next Gen (A) NOT DETECTED Corrected    INVALID, UNABLE TO DETERMINE THE PRESENCE OF TARGET DUE TO SPECIMEN INTEGRITY. RECOLLECTION REQUESTED.    Comment: NOTIFIED RN SAUNDRA JACKSON ON 03/24/24 @ 1739 BY DRT Performed at [facility], [address], [place], [name] [address]   MRSA Next Gen by PCR, Nasal      Status: Abnormal   Collection Time: 03/24/24  5:40 PM   Specimen: Nasal Mucosa; Nasal Swab  Result Value Ref Range Status   MRSA by PCR Next Gen (A) NOT DETECTED Final    INVALID, UNABLE TO DETERMINE THE PRESENCE OF TARGET DUE TO SPECIMEN INTEGRITY. RECOLLECTION REQUESTED.    Comment: RESULT CALLED TO, READ BACK BY AND VERIFIED WITH: RN FABIENE MOLT [phone] @2027  FH Performed at [facility], [address], [place], [name] [address]   MRSA Next Gen by PCR, Nasal     Status: Abnormal   Collection Time: 03/25/24  6:17 AM   Specimen: Nasal Mucosa; Nasal Swab  Result Value Ref Range Status   MRSA by PCR Next Gen DETECTED (A) NOT DETECTED Final    Comment: RESULT CALLED TO, READ BACK BY AND  VERIFIED WITH: RN DAN WHITE ON 03/25/24 @ 1500 BY DRT (NOTE) The GeneXpert MRSA Assay (FDA approved for NASAL specimens only), is one component of a comprehensive MRSA colonization surveillance program. It is not intended to diagnose MRSA infection nor to guide or monitor treatment for MRSA infections. Test performance is not FDA approved in patients less than 72 years old. Performed at [facility], [address], [place], [name] [address]   Culture, Respiratory w Gram Stain     Status: None   Collection Time: 03/30/24 10:42 AM   Specimen: Tracheal Aspirate; Respiratory  Result Value Ref Range Status   Specimen Description TRACHEAL ASPIRATE  Final   Special Requests NONE  Final   Gram Stain   Final    ABUNDANT WBC PRESENT, PREDOMINANTLY PMN FEW GRAM NEGATIVE DIPLOCOCCI FEW GRAM POSITIVE COCCI RARE GRAM POSITIVE RODS FEW GRAM NEGATIVE RODS Performed at [facility], [address] [place], [name] [address]    Culture   Final    FEW ESCHERICHIA COLI RARE PSEUDOMONAS AERUGINOSA    Report Status 04/02/2024 FINAL  Final   Organism ID, Bacteria ESCHERICHIA COLI  Final   Organism ID, Bacteria PSEUDOMONAS AERUGINOSA  Final      Susceptibility   Escherichia coli - MIC*     AMPICILLIN  >=32 RESISTANT Resistant     CEFAZOLIN  Value in next row Resistant      >=32 RESISTANTThis is a modified FDA-approved test that has been validated and its performance characteristics determined by the reporting laboratory.  This laboratory is certified under the Clinical Laboratory Improvement Amendments CLIA as qualified to perform high complexity clinical laboratory testing.    CEFEPIME  Value in next row Sensitive      >=32 RESISTANTThis is a modified FDA-approved test that has been validated and its performance characteristics determined by the reporting laboratory.  This laboratory is certified under the Clinical Laboratory Improvement Amendments CLIA as qualified to perform high complexity clinical laboratory testing.    ERTAPENEM Value in next row Sensitive      >=32 RESISTANTThis is a modified FDA-approved test that has been validated and its performance characteristics determined by the reporting laboratory.  This laboratory is certified under the Clinical Laboratory Improvement Amendments CLIA as qualified to perform high complexity clinical laboratory testing.    CEFTRIAXONE  Value in next row Sensitive      >=32 RESISTANTThis is a modified FDA-approved test that has been validated and its performance characteristics determined by the reporting laboratory.  This laboratory is certified under the Clinical Laboratory Improvement Amendments CLIA as qualified to perform high complexity clinical laboratory testing.    CIPROFLOXACIN  Value in next row Sensitive      >=32 RESISTANTThis is a modified FDA-approved test that has been validated and its performance characteristics determined by the reporting laboratory.  This laboratory is certified under the Clinical Laboratory Improvement Amendments CLIA as qualified to perform high complexity clinical laboratory testing.    GENTAMICIN Value in next row Sensitive      >=32 RESISTANTThis is a modified FDA-approved test that has been validated and  its performance characteristics determined by the reporting laboratory.  This laboratory is certified under the Clinical Laboratory Improvement Amendments CLIA as qualified to perform high complexity clinical laboratory testing.    MEROPENEM Value in next row Sensitive      >=32 RESISTANTThis is a modified FDA-approved test that has been validated and its performance characteristics determined by the reporting laboratory.  This laboratory is certified  under the Clinical Laboratory Improvement Amendments CLIA as qualified to perform high complexity clinical laboratory testing.    TRIMETH/SULFA Value in next row Sensitive      >=32 RESISTANTThis is a modified FDA-approved test that has been validated and its performance characteristics determined by the reporting laboratory.  This laboratory is certified under the Clinical Laboratory Improvement Amendments CLIA as qualified to perform high complexity clinical laboratory testing.    AMPICILLIN /SULBACTAM Value in next row Resistant      >=32 RESISTANTThis is a modified FDA-approved test that has been validated and its performance characteristics determined by the reporting laboratory.  This laboratory is certified under the Clinical Laboratory Improvement Amendments CLIA as qualified to perform high complexity clinical laboratory testing.    PIP/TAZO Value in next row Resistant ug/mL     >=128 RESISTANTThis is a modified FDA-approved test that has been validated and its performance characteristics determined by the reporting laboratory.  This laboratory is certified under the Clinical Laboratory Improvement Amendments CLIA as qualified to perform high complexity clinical laboratory testing.    * FEW ESCHERICHIA COLI   Pseudomonas aeruginosa - MIC*    MEROPENEM Value in next row Sensitive      >=128 RESISTANTThis is a modified FDA-approved test that has been validated and its performance characteristics determined by the reporting laboratory.  This laboratory  is certified under the Clinical Laboratory Improvement Amendments CLIA as qualified to perform high complexity clinical laboratory testing.    CIPROFLOXACIN  Value in next row Sensitive      >=128 RESISTANTThis is a modified FDA-approved test that has been validated and its performance characteristics determined by the reporting laboratory.  This laboratory is certified under the Clinical Laboratory Improvement Amendments CLIA as qualified to perform high complexity clinical laboratory testing.    IMIPENEM Value in next row Sensitive      >=128 RESISTANTThis is a modified FDA-approved test that has been validated and its performance characteristics determined by the reporting laboratory.  This laboratory is certified under the Clinical Laboratory Improvement Amendments CLIA as qualified to perform high complexity clinical laboratory testing.    PIP/TAZO Value in next row Sensitive ug/mL     16 SENSITIVEThis is a modified FDA-approved test that has been validated and its performance characteristics determined by the reporting laboratory.  This laboratory is certified under the Clinical Laboratory Improvement Amendments CLIA as qualified to perform high complexity clinical laboratory testing.    CEFEPIME  Value in next row Sensitive      16 SENSITIVEThis is a modified FDA-approved test that has been validated and its performance characteristics determined by the reporting laboratory.  This laboratory is certified under the Clinical Laboratory Improvement Amendments CLIA as qualified to perform high complexity clinical laboratory testing.    CEFTAZIDIME/AVIBACTAM Value in next row Sensitive ug/mL     16 SENSITIVEThis is a modified FDA-approved test that has been validated and its performance characteristics determined by the reporting laboratory.  This laboratory is certified under the Clinical Laboratory Improvement Amendments CLIA as qualified to perform high complexity clinical laboratory testing.     CEFTOLOZANE/TAZOBACTAM Value in next row Sensitive ug/mL     16 SENSITIVEThis is a modified FDA-approved test that has been validated and its performance characteristics determined by the reporting laboratory.  This laboratory is certified under the Clinical Laboratory Improvement Amendments CLIA as qualified to perform high complexity clinical laboratory testing.    TOBRAMYCIN Value in next row Sensitive      16 SENSITIVEThis is a modified  FDA-approved test that has been validated and its performance characteristics determined by the reporting laboratory.  This laboratory is certified under the Clinical Laboratory Improvement Amendments CLIA as qualified to perform high complexity clinical laboratory testing.    CEFTAZIDIME Value in next row Sensitive      16 SENSITIVEThis is a modified FDA-approved test that has been validated and its performance characteristics determined by the reporting laboratory.  This laboratory is certified under the Clinical Laboratory Improvement Amendments CLIA as qualified to perform high complexity clinical laboratory testing.    * RARE PSEUDOMONAS AERUGINOSA  Culture, blood (Routine X 2) w Reflex to ID Panel     Status: None (Preliminary result)   Collection Time: 03/30/24 11:44 AM   Specimen: BLOOD LEFT HAND  Result Value Ref Range Status   Specimen Description BLOOD LEFT HAND  Final   Special Requests   Final    BOTTLES DRAWN AEROBIC ONLY Blood Culture results may not be optimal due to an inadequate volume of blood received in culture bottles   Culture   Final    NO GROWTH 4 DAYS Performed at [facility], [address], [place], [name] [address]    Report Status PENDING  Incomplete  Culture, blood (Routine X 2) w Reflex to ID Panel     Status: None (Preliminary result)   Collection Time: 03/30/24 11:44 AM   Specimen: BLOOD LEFT HAND  Result Value Ref Range Status   Specimen Description BLOOD LEFT HAND  Final   Special Requests   Final    BOTTLES  DRAWN AEROBIC AND ANAEROBIC Blood Culture results may not be optimal due to an inadequate volume of blood received in culture bottles   Culture   Final    NO GROWTH 4 DAYS Performed at [facility], [address] [place], [name] [address]    Report Status PENDING  Incomplete  Remove and replace urinary cath (placed > 5 days) then obtain urine culture from new indwelling urinary catheter.     Status: Abnormal   Collection Time: 03/30/24  4:51 PM   Specimen: Urine, Catheterized  Result Value Ref Range Status   Specimen Description URINE, CATHETERIZED  Final   Special Requests   Final    NONE Performed at [facility], [address], [place], [name] [address]    Culture 80,000 COLONIES/mL PSEUDOMONAS AERUGINOSA (A)  Final   Report Status 04/01/2024 FINAL  Final   Organism ID, Bacteria PSEUDOMONAS AERUGINOSA (A)  Final      Susceptibility   Pseudomonas aeruginosa - MIC*    MEROPENEM <=0.25 SENSITIVE Sensitive     CIPROFLOXACIN  0.12 SENSITIVE Sensitive     IMIPENEM 1 SENSITIVE Sensitive     PIP/TAZO Value in next row Sensitive ug/mL     <=4 SENSITIVEThis is a modified FDA-approved test that has been validated and its performance characteristics determined by the reporting laboratory.  This laboratory is certified under the Clinical Laboratory Improvement Amendments CLIA as qualified to perform high complexity clinical laboratory testing.    CEFEPIME  Value in next row Sensitive      <=4 SENSITIVEThis is a modified FDA-approved test that has been validated and its performance characteristics determined by the reporting laboratory.  This laboratory is certified under the Clinical Laboratory Improvement Amendments CLIA as qualified to perform high complexity clinical laboratory testing.    CEFTAZIDIME/AVIBACTAM Value in next row Sensitive ug/mL     <=4 SENSITIVEThis is a modified FDA-approved test that has  been validated and its performance characteristics determined by the  reporting laboratory.  This laboratory is certified under the Clinical Laboratory Improvement Amendments CLIA as qualified to perform high complexity clinical laboratory testing.    CEFTOLOZANE/TAZOBACTAM Value in next row Sensitive ug/mL     <=4 SENSITIVEThis is a modified FDA-approved test that has been validated and its performance characteristics determined by the reporting laboratory.  This laboratory is certified under the Clinical Laboratory Improvement Amendments CLIA as qualified to perform high complexity clinical laboratory testing.    TOBRAMYCIN Value in next row Sensitive      <=4 SENSITIVEThis is a modified FDA-approved test that has been validated and its performance characteristics determined by the reporting laboratory.  This laboratory is certified under the Clinical Laboratory Improvement Amendments CLIA as qualified to perform high complexity clinical laboratory testing.    CEFTAZIDIME Value in next row Sensitive      <=4 SENSITIVEThis is a modified FDA-approved test that has been validated and its performance characteristics determined by the reporting laboratory.  This laboratory is certified under the Clinical Laboratory Improvement Amendments CLIA as qualified to perform high complexity clinical laboratory testing.    * 80,000 COLONIES/mL PSEUDOMONAS AERUGINOSA     Radiology Studies: No results found.    Nilda Fendt, MD, PhD [facility]  Between 7 am - 7 pm I am available, please contact me via Amion (for emergencies) or Securechat (non urgent messages)  Between 7 pm - 7 am I am not available, please contact night coverage MD/APP via Amion

## 2024-04-03 NOTE — TOC Progression Note (Signed)
 Transition of Care [facility]) - Progression Note    Patient Details  Name: Garrett [name] MRN: [id number] Date of Birth: 1975/12/12  Transition of [facility]) CM/SW Contact  Andrez JULIANNA George, RN Phone Number: 04/03/2024, 3:29 PM  Clinical Narrative:     Trach/ Peg/ IV abx Plan is for LT SNF.  IP Care management following.   Expected Discharge Plan: Long Term Nursing Home Barriers to Discharge: Continued Medical Work up, SNF Pending Medicaid, SNF Pending bed offer, SNF Pending payor source - LOG, Inadequate or no insurance (New trach)               Expected Discharge Plan and Services In-house Referral: Clinical Social Work, Hospice / Palliative Care   Post Acute Care Choice: Skilled Nursing Facility Living arrangements for the past 2 months: Single Family Home                                       Social Drivers of Health (SDOH) Interventions SDOH Screenings   Food Insecurity: Patient Unable To Answer (10/29/2023)  Social Connections: Unknown (06/23/2023)   Received from [facility]  Tobacco Use: High Risk (10/31/2023)    Readmission Risk Interventions     No data to display

## 2024-04-04 DIAGNOSIS — I619 Nontraumatic intracerebral hemorrhage, unspecified: Secondary | ICD-10-CM | POA: Diagnosis not present

## 2024-04-04 LAB — CULTURE, BLOOD (ROUTINE X 2)
Culture: NO GROWTH
Culture: NO GROWTH

## 2024-04-04 LAB — POTASSIUM: Potassium: 4.1 mmol/L (ref 3.5–5.1)

## 2024-04-04 LAB — MAGNESIUM: Magnesium: 2 mg/dL (ref 1.7–2.4)

## 2024-04-04 NOTE — Progress Notes (Signed)
 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  He has had a prolonged hospitalization, admitted in March 2025.  Hospital course complicated by persistent vegetative state, recurrent pneumonias, now has a tracheostomy and a PEG tube  Significant events: 3/8: Intubated in ED, pontine bleed/SAH. CT head Acute large hemorrhage 4.1 cm in pons with intraventricular extension without hydrocephalus. 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  Restarted antibiotics.  Urine culture + E. Coli, foley catheter changed 5/18: Completed antibiotics for UTI 5/22: Transferred [facility] 6/11: De-cannulated 01/29/2024 6/27: Placed on 3 L oxygen overnight, tachypnea, increased secretion. Started IV Unasyn . CCM re-consulted.  7/7: Transferred back to ICU; Re-intubated 7/8: ENT consulted for trach 7/16: Trach replaced by ENT 7/17-8/5: Slow vent weaning with low success 8/6: Transferred back to hospitalist service still on vent 8/7: Not able to wean to trach collar 8/8: Only on trach collar a few hours 8/9: Only on trach collar briefly, then had to go back on  mechanical ventilation 8/11: Tolerating ATC 24 hours, Ophtho reconsulted, placed tarsorrhaphy    Significant studies: 3/8 CT head on admission: 4.1 cm hemorrhage centered on pons with intraventricular extension into the 4th ventricle and basal cisterns and small [facility] 3/9 CTA head and neck: no change in IPH and [facility] 3/17 CT head: interval decrease in IPH 3/29 CT head: evolving unremarkably 5/10 CT abdomen: Foley catheter through gastrostomy tract 5/15 US  abdomen: Unremarkable 7/19 CT head: Encephalomalacia at site of prior hemorrhage   Significant microbiology data: 4/1 trach aspirate: MRSA 4/8 trach aspirate: MRSA 5/13 foley catheter urine: E coli 5/13 blood cx x2: no growth 5/14 trach aspirate: MRSA 6/1 blood cx x2: no growth 6/2 trach aspirate: MRSA 6/27 sputum: MRSA, klebsiella 7/7 trach aspirate: MRSA, candida 8/10 Urine cx: PsA  8/10 Blood culture x2: NGTD 8/10 trach aspirate: E coli and PsA  Subjective / 24h Interval events: Eyes open, remains unresponsive  Assesement and Plan: Principal Problem:   ICH (intracerebral hemorrhage) (HCC) Active Problems:   Advanced care planning/counseling discussion   Poor prognosis   Tracheostomy status (HCC)   Pressure injury of skin   Goals of care, counseling/discussion   Chronic respiratory failure with hypoxia (HCC)   Acute respiratory failure (HCC)   Chronic hypoxic respiratory failure (HCC)   Pontine hemorrhage (HCC)   Principal problem 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 Tobie -Now able to tolerate trach collar for the last few days.  Pulmonary following intermittently  for trach management.  Appreciate  follow-up   Recurrent fever 8/11, suspect pseudomonal/E. coli tracheitis - New fevers in the last week, cultures from blood, urine, tracheal aspirate showed colonization with Pseudomonas, also E. coli on trachea.  Discussed with pulmonology, they recommended empiric treatment, probably for tracheitis, possible CXR-negative pneumonia. -Has been placed on cefepime , today is day 4/14  NSVT - ~40 beats last night, recheck Mg and K. 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  Ophtho Dr. Waylan consulted on 8/11 and placed tarsorraphy on 8/11. - q2 moxifloxacin and moisturizing ointment -Ophthalmology will follow-up on Monday   Hypertension -BP overall stable, continue amlodipine , metoprolol    Normocytic anemia - Stable without bleeding   Class III obesity - BMI 42, complicates care  Scheduled Meds:  amLODipine   5 mg Per Tube Daily   artificial tears   Right Eye Q2H   Chlorhexidine  Gluconate Cloth  6 each Topical Daily   docusate  100 mg Per Tube BID   enoxaparin  (LOVENOX ) injection  40 mg Subcutaneous Daily   famotidine   20 mg Per Tube BID   feeding supplement (JEVITY 1.5 CAL/FIBER)  237 mL Per Tube 5 X Daily   feeding supplement (PROSource TF20)  60 mL Per Tube TID   free water   200 mL Per Tube BID   gatifloxacin   1 drop Left Eye Q2H   metoprolol  tartrate  75 mg Per Tube BID   multivitamin with minerals  1 tablet Per Tube Daily   mouth rinse  15 mL Mouth Rinse Q2H   polyethylene glycol  17 g Per Tube BID   sodium chloride  flush  10-40 mL Intracatheter Q12H   Continuous Infusions:  ceFEPime  (MAXIPIME ) IV 2 g (04/03/24 2237)   PRN Meds:.acetaminophen  **OR** acetaminophen  (TYLENOL ) oral liquid 160 mg/5 mL **OR** acetaminophen , bisacodyl , Gerhardt's butt cream, hydrALAZINE , ipratropium-albuterol , mouth rinse, sodium chloride  flush,  traZODone   Current Outpatient Medications  Medication Instructions   acetaminophen  (TYLENOL ) 1,000 mg, Oral, Every 6 hours PRN   ibuprofen (ADVIL) 600 mg, Oral, Every 6 hours PRN    Diet Orders (From admission, onward)     Start     Ordered   03/04/24 2359  Diet NPO time specified  Diet effective midnight        03/03/24 1402            DVT prophylaxis: enoxaparin  (LOVENOX ) injection 40 mg Start: 03/06/24 1000 SCDs Start: 10/27/23 2226   Lab Results  Component Value Date   PLT 588 (H) 04/03/2024      Code Status: Full Code  Family Communication: No family at bedside  Status is: Inpatient Remains inpatient appropriate because: Severity of illness  Level of care: Progressive  Consultants:  PCCM ENT Ophthalmology  Objective: Vitals:   04/03/24 2335 04/04/24 0022 04/04/24 0358 04/04/24 0416  BP: (!) 155/92  (!) 141/89   Pulse: 72 72 (!) 58 70  Resp: 20 20 19 18   Temp: 98.3 F (36.8 C)  98.2 F (36.8 C)   TempSrc: Axillary  Oral   SpO2: 100% 94% 94% 99%  Weight:      Height:        Intake/Output Summary (Last 24 hours) at 04/04/2024 0653 Last data filed at 04/03/2024 2240 Gross per 24 hour  Intake 697 ml  Output 300 ml  Net 397 ml   Wt Readings from Last  3 Encounters:  04/01/24 128 kg    Examination:  Constitutional: NAD Eyes: lids and conjunctivae normal, no scleral icterus ENMT: mmm Neck: normal, supple Respiratory: clear to auscultation bilaterally, no wheezing, no crackles.  Cardiovascular: Regular rate and rhythm, no murmurs / rubs / gallops.  Abdomen: soft, no distention, no tenderness. Bowel sounds positive.   Data Reviewed: I have independently reviewed following labs and imaging studies   CBC Recent Labs  Lab 03/31/24 1744 04/02/24 0436 04/03/24 0344  WBC 8.1 8.5 8.9  HGB 10.5* 10.6* 10.9*  HCT 34.8* 35.7* 37.0*  PLT 556* 529* 588*  MCV 89.5 90.6 91.4  MCH 27.0 26.9 26.9  MCHC 30.2 29.7* 29.5*  RDW 15.3 15.2 15.0     Recent Labs  Lab 03/31/24 1744 04/02/24 0436 04/03/24 0344  NA 137 140 138  K 3.9 4.5 4.0  CL 101 102 99  CO2 26 30 31   GLUCOSE 125* 99 99  BUN 16 22* 21*  CREATININE 0.46* 0.37* 0.42*  CALCIUM 8.9 9.5 9.1  AST  --  44* 41  ALT  --  98* 97*  ALKPHOS  --  57 58  BILITOT  --  0.2 0.4  ALBUMIN   --  2.8* 2.8*  MG  --   --  2.2    ------------------------------------------------------------------------------------------------------------------ No results for input(s): CHOL, HDL, LDLCALC, TRIG, CHOLHDL, LDLDIRECT in the last 72 hours.  Lab Results  Component Value Date   HGBA1C 4.7 (L) 10/27/2023   ------------------------------------------------------------------------------------------------------------------ No results for input(s): TSH, T4TOTAL, T3FREE, THYROIDAB in the last 72 hours.  Invalid input(s): FREET3  Cardiac Enzymes No results for input(s): CKMB, TROPONINI, MYOGLOBIN in the last 168 hours.  Invalid input(s): CK ------------------------------------------------------------------------------------------------------------------    Component Value Date/Time   BNP 210.4 (H) 10/27/2023 1820    CBG: Recent Labs  Lab 03/30/24 0626 03/30/24 1107 03/30/24 2327 03/31/24 0327 03/31/24 0747  GLUCAP 94 104* 134* 105* 150*    Recent Results (from the past 240 hours)  Culture, Respiratory w Gram Stain     Status: None   Collection Time: 03/30/24 10:42 AM   Specimen: Tracheal Aspirate; Respiratory  Result Value Ref Range Status   Specimen Description TRACHEAL ASPIRATE  Final   Special Requests NONE  Final   Gram Stain   Final    ABUNDANT WBC PRESENT, PREDOMINANTLY PMN FEW GRAM NEGATIVE DIPLOCOCCI FEW GRAM POSITIVE COCCI RARE GRAM POSITIVE RODS FEW GRAM NEGATIVE RODS Performed at [facility], [address] [place], [name] [address]    Culture   Final    FEW ESCHERICHIA COLI RARE PSEUDOMONAS AERUGINOSA     Report Status 04/02/2024 FINAL  Final   Organism ID, Bacteria ESCHERICHIA COLI  Final   Organism ID, Bacteria PSEUDOMONAS AERUGINOSA  Final      Susceptibility   Escherichia coli - MIC*    AMPICILLIN  >=32 RESISTANT Resistant     CEFAZOLIN  Value in next row Resistant      >=32 RESISTANTThis is a modified FDA-approved test that has been validated and its performance characteristics determined by the reporting laboratory.  This laboratory is certified under the Clinical Laboratory Improvement Amendments CLIA as qualified to perform high complexity clinical laboratory testing.    CEFEPIME  Value in next row Sensitive      >=32 RESISTANTThis is a modified FDA-approved test that has been validated and its performance characteristics determined by the reporting laboratory.  This laboratory is certified under the Clinical Laboratory Improvement Amendments CLIA as qualified to perform  high complexity clinical laboratory testing.    ERTAPENEM Value in next row Sensitive      >=32 RESISTANTThis is a modified FDA-approved test that has been validated and its performance characteristics determined by the reporting laboratory.  This laboratory is certified under the Clinical Laboratory Improvement Amendments CLIA as qualified to perform high complexity clinical laboratory testing.    CEFTRIAXONE  Value in next row Sensitive      >=32 RESISTANTThis is a modified FDA-approved test that has been validated and its performance characteristics determined by the reporting laboratory.  This laboratory is certified under the Clinical Laboratory Improvement Amendments CLIA as qualified to perform high complexity clinical laboratory testing.    CIPROFLOXACIN  Value in next row Sensitive      >=32 RESISTANTThis is a modified FDA-approved test that has been validated and its performance characteristics determined by the reporting laboratory.  This laboratory is certified under the Clinical Laboratory Improvement Amendments CLIA as  qualified to perform high complexity clinical laboratory testing.    GENTAMICIN Value in next row Sensitive      >=32 RESISTANTThis is a modified FDA-approved test that has been validated and its performance characteristics determined by the reporting laboratory.  This laboratory is certified under the Clinical Laboratory Improvement Amendments CLIA as qualified to perform high complexity clinical laboratory testing.    MEROPENEM Value in next row Sensitive      >=32 RESISTANTThis is a modified FDA-approved test that has been validated and its performance characteristics determined by the reporting laboratory.  This laboratory is certified under the Clinical Laboratory Improvement Amendments CLIA as qualified to perform high complexity clinical laboratory testing.    TRIMETH/SULFA Value in next row Sensitive      >=32 RESISTANTThis is a modified FDA-approved test that has been validated and its performance characteristics determined by the reporting laboratory.  This laboratory is certified under the Clinical Laboratory Improvement Amendments CLIA as qualified to perform high complexity clinical laboratory testing.    AMPICILLIN /SULBACTAM Value in next row Resistant      >=32 RESISTANTThis is a modified FDA-approved test that has been validated and its performance characteristics determined by the reporting laboratory.  This laboratory is certified under the Clinical Laboratory Improvement Amendments CLIA as qualified to perform high complexity clinical laboratory testing.    PIP/TAZO Value in next row Resistant ug/mL     >=128 RESISTANTThis is a modified FDA-approved test that has been validated and its performance characteristics determined by the reporting laboratory.  This laboratory is certified under the Clinical Laboratory Improvement Amendments CLIA as qualified to perform high complexity clinical laboratory testing.    * FEW ESCHERICHIA COLI   Pseudomonas aeruginosa - MIC*    MEROPENEM Value in  next row Sensitive      >=128 RESISTANTThis is a modified FDA-approved test that has been validated and its performance characteristics determined by the reporting laboratory.  This laboratory is certified under the Clinical Laboratory Improvement Amendments CLIA as qualified to perform high complexity clinical laboratory testing.    CIPROFLOXACIN  Value in next row Sensitive      >=128 RESISTANTThis is a modified FDA-approved test that has been validated and its performance characteristics determined by the reporting laboratory.  This laboratory is certified under the Clinical Laboratory Improvement Amendments CLIA as qualified to perform high complexity clinical laboratory testing.    IMIPENEM Value in next row Sensitive      >=128 RESISTANTThis is a modified FDA-approved test that has been validated and its performance characteristics determined  by the reporting laboratory.  This laboratory is certified under the Clinical Laboratory Improvement Amendments CLIA as qualified to perform high complexity clinical laboratory testing.    PIP/TAZO Value in next row Sensitive ug/mL     16 SENSITIVEThis is a modified FDA-approved test that has been validated and its performance characteristics determined by the reporting laboratory.  This laboratory is certified under the Clinical Laboratory Improvement Amendments CLIA as qualified to perform high complexity clinical laboratory testing.    CEFEPIME  Value in next row Sensitive      16 SENSITIVEThis is a modified FDA-approved test that has been validated and its performance characteristics determined by the reporting laboratory.  This laboratory is certified under the Clinical Laboratory Improvement Amendments CLIA as qualified to perform high complexity clinical laboratory testing.    CEFTAZIDIME/AVIBACTAM Value in next row Sensitive ug/mL     16 SENSITIVEThis is a modified FDA-approved test that has been validated and its performance characteristics determined by  the reporting laboratory.  This laboratory is certified under the Clinical Laboratory Improvement Amendments CLIA as qualified to perform high complexity clinical laboratory testing.    CEFTOLOZANE/TAZOBACTAM Value in next row Sensitive ug/mL     16 SENSITIVEThis is a modified FDA-approved test that has been validated and its performance characteristics determined by the reporting laboratory.  This laboratory is certified under the Clinical Laboratory Improvement Amendments CLIA as qualified to perform high complexity clinical laboratory testing.    TOBRAMYCIN Value in next row Sensitive      16 SENSITIVEThis is a modified FDA-approved test that has been validated and its performance characteristics determined by the reporting laboratory.  This laboratory is certified under the Clinical Laboratory Improvement Amendments CLIA as qualified to perform high complexity clinical laboratory testing.    CEFTAZIDIME Value in next row Sensitive      16 SENSITIVEThis is a modified FDA-approved test that has been validated and its performance characteristics determined by the reporting laboratory.  This laboratory is certified under the Clinical Laboratory Improvement Amendments CLIA as qualified to perform high complexity clinical laboratory testing.    * RARE PSEUDOMONAS AERUGINOSA  Culture, blood (Routine X 2) w Reflex to ID Panel     Status: None (Preliminary result)   Collection Time: 03/30/24 11:44 AM   Specimen: BLOOD LEFT HAND  Result Value Ref Range Status   Specimen Description BLOOD LEFT HAND  Final   Special Requests   Final    BOTTLES DRAWN AEROBIC ONLY Blood Culture results may not be optimal due to an inadequate volume of blood received in culture bottles   Culture   Final    NO GROWTH 4 DAYS Performed at [facility], [address], [place], [name] [address]    Report Status PENDING  Incomplete  Culture, blood (Routine X 2) w Reflex to ID Panel     Status: None (Preliminary result)    Collection Time: 03/30/24 11:44 AM   Specimen: BLOOD LEFT HAND  Result Value Ref Range Status   Specimen Description BLOOD LEFT HAND  Final   Special Requests   Final    BOTTLES DRAWN AEROBIC AND ANAEROBIC Blood Culture results may not be optimal due to an inadequate volume of blood received in culture bottles   Culture   Final    NO GROWTH 4 DAYS Performed at [facility], [address] [place], [name] [address]    Report Status PENDING  Incomplete  Remove and replace urinary cath (placed > 5  days) then obtain urine culture from new indwelling urinary catheter.     Status: Abnormal   Collection Time: 03/30/24  4:51 PM   Specimen: Urine, Catheterized  Result Value Ref Range Status   Specimen Description URINE, CATHETERIZED  Final   Special Requests   Final    NONE Performed at [facility], [address] [place], [name] [address]    Culture 80,000 COLONIES/mL PSEUDOMONAS AERUGINOSA (A)  Final   Report Status 04/01/2024 FINAL  Final   Organism ID, Bacteria PSEUDOMONAS AERUGINOSA (A)  Final      Susceptibility   Pseudomonas aeruginosa - MIC*    MEROPENEM <=0.25 SENSITIVE Sensitive     CIPROFLOXACIN  0.12 SENSITIVE Sensitive     IMIPENEM 1 SENSITIVE Sensitive     PIP/TAZO Value in next row Sensitive ug/mL     <=4 SENSITIVEThis is a modified FDA-approved test that has been validated and its performance characteristics determined by the reporting laboratory.  This laboratory is certified under the Clinical Laboratory Improvement Amendments CLIA as qualified to perform high complexity clinical laboratory testing.    CEFEPIME  Value in next row Sensitive      <=4 SENSITIVEThis is a modified FDA-approved test that has been validated and its performance characteristics determined by the reporting laboratory.  This laboratory is certified under the Clinical Laboratory Improvement Amendments CLIA as qualified to perform high complexity clinical laboratory testing.     CEFTAZIDIME/AVIBACTAM Value in next row Sensitive ug/mL     <=4 SENSITIVEThis is a modified FDA-approved test that has been validated and its performance characteristics determined by the reporting laboratory.  This laboratory is certified under the Clinical Laboratory Improvement Amendments CLIA as qualified to perform high complexity clinical laboratory testing.    CEFTOLOZANE/TAZOBACTAM Value in next row Sensitive ug/mL     <=4 SENSITIVEThis is a modified FDA-approved test that has been validated and its performance characteristics determined by the reporting laboratory.  This laboratory is certified under the Clinical Laboratory Improvement Amendments CLIA as qualified to perform high complexity clinical laboratory testing.    TOBRAMYCIN Value in next row Sensitive      <=4 SENSITIVEThis is a modified FDA-approved test that has been validated and its performance characteristics determined by the reporting laboratory.  This laboratory is certified under the Clinical Laboratory Improvement Amendments CLIA as qualified to perform high complexity clinical laboratory testing.    CEFTAZIDIME Value in next row Sensitive      <=4 SENSITIVEThis is a modified FDA-approved test that has been validated and its performance characteristics determined by the reporting laboratory.  This laboratory is certified under the Clinical Laboratory Improvement Amendments CLIA as qualified to perform high complexity clinical laboratory testing.    * 80,000 COLONIES/mL PSEUDOMONAS AERUGINOSA     Radiology Studies: No results found.    Nilda Fendt, MD, PhD [facility]  Between 7 am - 7 pm I am available, please contact me via Amion (for emergencies) or Securechat (non urgent messages)  Between 7 pm - 7 am I am not available, please contact night coverage MD/APP via Amion

## 2024-04-04 NOTE — Progress Notes (Signed)
 Occupational Therapy Treatment Patient Details Name: Garrett [name] MRN: [id number] DOB: 02/07/1976 Today's Date: 04/04/2024   History of present illness Pt is a 48 yo male who was found down 10/27/23 with agonal breathing. 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 Intubated 3/8, cortrak placed 3/14, trach placed 3/22, PEG placed 3/24. Reintubated 02/25/2024 and trach 7/16. 8/7 intubated moved to ICU  8/12 off vent 8/11 tarsorrhaphy placement for severe keratopathy  PMH: HTN, smoker   OT comments  OT is discharging patient from caseload as patient has not progressed at this time. Please reorder OT if the patient is able to follow 2 different commands 50% of all attempts consistently, visually tracking 75% of session, or needs A for splints due to contractures ( resting hand splints in place at this time). OT sign off at this time.       If plan is discharge home, recommend the following:  Two people to help with bathing/dressing/bathroom   Equipment Recommendations  Hospital bed;Hoyer lift (air mattress overlay)    Recommendations for Other Services      Precautions / Restrictions Precautions Precautions: Fall Recall of Precautions/Restrictions: Impaired Precaution/Restrictions Comments: peg, trach SBP <160 Splint/Cast: Bil resting hand splints Restrictions Weight Bearing Restrictions Per Provider Order: No       Mobility Bed Mobility Overal bed mobility: Needs Assistance             General bed mobility comments: total (A) . Ot was unabel to fully roll patient with pad total (A) . pt requires two person (A) for rolling safely at this time    Transfers                   General transfer comment: attempted upright chair position and pt with a flexed posture. pillow placed behind patient with out any  improved posture for chair sitting.     Balance     Sitting balance-Leahy Scale: Zero                                     ADL either performed or assessed with clinical judgement   ADL                                         General ADL Comments: total (A)    Extremity/Trunk Assessment Upper Extremity Assessment RUE Deficits / Details: noted to have decreased MCP flexion and a hooked like contracture of hand, decreased wrist flexion. pt has limited elbow flexion to 60 degrees  unable to bring hand to mouth, no activation noted this session RUE Sensation: decreased light touch;decreased proprioception RUE Coordination: decreased fine motor;decreased gross motor LUE Deficits / Details: pt only with thumb flicker of movement this session, pt with full PROM shoulder elbow wrist, noted to have decreased digit flexion with mild contracture LUE Sensation: decreased light touch;decreased proprioception LUE Coordination: decreased fine motor;decreased gross motor   Lower Extremity Assessment Lower Extremity Assessment: Defer to PT evaluation        Vision   Vision Assessment?: Vision impaired- to be further tested in functional context Additional Comments: pt currently with L eye sewn closed by 8/11 tarsorrhaphy placement   Perception  Praxis     Communication Communication Communication: Impaired Factors Affecting Communication: Trach/intubated   Cognition Arousal: Alert Behavior During Therapy: Flat affect Cognition: Difficult to assess Difficult to assess due to: Tracheostomy           OT - Cognition Comments: opens R eye wider to name call                 Following commands: Impaired        Cueing   Cueing Techniques: Tactile cues, Verbal cues, Visual cues  Exercises General Exercises - Upper Extremity Shoulder Flexion: PROM, Both, 5 reps, Supine Shoulder ABduction: PROM, Both, 5 reps, Supine Elbow Flexion: PROM,  Both, 10 reps, Seated Elbow Extension: PROM, Both, 10 reps, Seated Wrist Flexion: PROM, Both, 10 reps, Supine Wrist Extension: PROM, Both, 10 reps, Supine Digit Composite Flexion: PROM, Both, 10 reps, Supine Composite Extension: PROM, Both, 10 reps, Supine Other Exercises Other Exercises: attempted command following without success Other Exercises: pt does visually track horizontal gaze with photo x2 successfully Other Exercises: pt does not follow commands with eye, mouth, L hand, R hand    Shoulder Instructions       General Comments trach collar , heavy oral secreations R side of mouth    Pertinent Vitals/ Pain       Pain Assessment Pain Assessment: No/denies pain Breathing: normal Negative Vocalization: none Facial Expression: smiling or inexpressive Body Language: relaxed Consolability: no need to console PAINAD Score: 0  Home Living                                          Prior Functioning/Environment              Frequency           Progress Toward Goals  OT Goals(current goals can now be found in the care plan section)  Progress towards OT goals: Not progressing toward goals - comment  Acute Rehab OT Goals Patient Stated Goal: unable OT Goal Formulation: Patient unable to participate in goal setting ADL Goals Pt/caregiver will Perform Home Exercise Program: Left upper extremity;Increased strength Additional ADL Goal #1: Pt will follow 1 step commands with 50% accuracy in a non distracting enviroment. Additional ADL Goal #2: Pt will complete bed mobility with max assist +2 as precursor to ADLs. Additional ADL Goal #3: Pt willl use ball squeeze or thumbs up with L hand for accurate yes answer on 5/5 attempts.  Plan      Co-evaluation                 AM-PAC OT 6 Clicks Daily Activity     Outcome Measure   Help from another person eating meals?: Total Help from another person taking care of personal grooming?:  Total Help from another person toileting, which includes using toliet, bedpan, or urinal?: Total Help from another person bathing (including washing, rinsing, drying)?: Total Help from another person to put on and taking off regular upper body clothing?: Total Help from another person to put on and taking off regular lower body clothing?: Total 6 Click Score: 6    End of Session Equipment Utilized During Treatment: Oxygen  OT Visit Diagnosis: Unsteadiness on feet (R26.81);Muscle weakness (generalized) (M62.81) 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   Ely Molt 04/04/2024, 2:50 PM

## 2024-04-04 NOTE — Plan of Care (Signed)
  Problem: Intracerebral Hemorrhage Tissue Perfusion: Goal: Complications of Intracerebral Hemorrhage will be minimized Outcome: Progressing   Problem: Education: Goal: Knowledge of disease or condition will improve Outcome: Progressing   Problem: Intracerebral Hemorrhage Tissue Perfusion: Goal: Complications of Intracerebral Hemorrhage will be minimized Outcome: Progressing   Problem: Self-Care: Goal: Ability to participate in self-care as condition permits will improve Outcome: Progressing Goal: Verbalization of feelings and concerns over difficulty with self-care will improve Outcome: Progressing Goal: Ability to communicate needs accurately will improve Outcome: Progressing   Problem: Nutrition: Goal: Risk of aspiration will decrease Outcome: Progressing Goal: Dietary intake will improve Outcome: Progressing   Problem: Education: Goal: Knowledge of disease or condition will improve Outcome: Progressing Goal: Knowledge of secondary prevention will improve (MUST DOCUMENT ALL) Outcome: Progressing Goal: Knowledge of patient specific risk factors will improve (DELETE if not current risk factor) Outcome: Progressing   Problem: Intracerebral Hemorrhage Tissue Perfusion: Goal: Complications of Intracerebral Hemorrhage will be minimized Outcome: Progressing   Problem: Coping: Goal: Will verbalize positive feelings about self Outcome: Progressing Goal: Will identify appropriate support needs Outcome: Progressing   Problem: Health Behavior/Discharge Planning: Goal: Ability to manage health-related needs will improve Outcome: Progressing Goal: Goals will be collaboratively established with patient/family Outcome: Progressing   Problem: Self-Care: Goal: Ability to participate in self-care as condition permits will improve Outcome: Progressing Goal: Verbalization of feelings and concerns over difficulty with self-care will improve Outcome: Progressing Goal: Ability to  communicate needs accurately will improve Outcome: Progressing   Problem: Nutrition: Goal: Risk of aspiration will decrease Outcome: Progressing Goal: Dietary intake will improve Outcome: Progressing   Problem: Fluid Volume: Goal: Ability to maintain a balanced intake and output will improve Outcome: Progressing   Problem: Skin Integrity: Goal: Risk for impaired skin integrity will decrease Outcome: Progressing   Problem: Tissue Perfusion: Goal: Adequacy of tissue perfusion will improve Outcome: Progressing   Problem: Activity: Goal: Risk for activity intolerance will decrease Outcome: Progressing   Problem: Nutrition: Goal: Adequate nutrition will be maintained Outcome: Progressing

## 2024-04-05 DIAGNOSIS — I619 Nontraumatic intracerebral hemorrhage, unspecified: Secondary | ICD-10-CM | POA: Diagnosis not present

## 2024-04-05 NOTE — Plan of Care (Signed)
  Problem: Intracerebral Hemorrhage Tissue Perfusion: Goal: Complications of Intracerebral Hemorrhage will be minimized 04/05/2024 [id number] by Pearly Doffing, RN Outcome: Progressing 04/04/2024 2312 by Pearly Doffing, RN Outcome: Progressing   Problem: Nutrition: Goal: Risk of aspiration will decrease 04/05/2024 0643 by Pearly Doffing, RN Outcome: Progressing 04/04/2024 2312 by Pearly Doffing, RN Outcome: Progressing Goal: Dietary intake will improve 04/05/2024 0643 by Pearly Doffing, RN Outcome: Progressing 04/04/2024 2312 by Pearly Doffing, RN Outcome: Progressing   Problem: Education: Goal: Knowledge of disease or condition will improve 04/05/2024 0643 by Pearly Doffing, RN Outcome: Progressing 04/04/2024 2312 by Pearly Doffing, RN Outcome: Progressing   Problem: Intracerebral Hemorrhage Tissue Perfusion: Goal: Complications of Intracerebral Hemorrhage will be minimized 04/05/2024 0643 by Pearly Doffing, RN Outcome: Progressing 04/04/2024 2312 by Pearly Doffing, RN Outcome: Progressing

## 2024-04-05 NOTE — Progress Notes (Signed)
 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  He has had a prolonged hospitalization, admitted in March 2025.  Hospital course complicated by persistent vegetative state, recurrent pneumonias, now has a tracheostomy and a PEG tube  Significant events: 3/8: Intubated in ED, pontine bleed/SAH. CT head Acute large hemorrhage 4.1 cm in pons with intraventricular extension without hydrocephalus. 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  Restarted antibiotics.  Urine culture + E. Coli, foley catheter changed 5/18: Completed antibiotics for UTI 5/22: Transferred [facility] 6/11: De-cannulated 01/29/2024 6/27: Placed on 3 L oxygen overnight, tachypnea, increased secretion. Started IV Unasyn . CCM re-consulted.  7/7: Transferred back to ICU; Re-intubated 7/8: ENT consulted for trach 7/16: Trach replaced by ENT 7/17-8/5: Slow vent weaning with low success 8/6: Transferred back to hospitalist service still on vent 8/7: Not able to wean to trach collar 8/8: Only on trach collar a few hours 8/9: Only on trach collar briefly, then had to go back on  mechanical ventilation 8/11: Tolerating ATC 24 hours, Ophtho reconsulted, placed tarsorrhaphy    Significant studies: 3/8 CT head on admission: 4.1 cm hemorrhage centered on pons with intraventricular extension into the 4th ventricle and basal cisterns and small [facility] 3/9 CTA head and neck: no change in IPH and [facility] 3/17 CT head: interval decrease in IPH 3/29 CT head: evolving unremarkably 5/10 CT abdomen: Foley catheter through gastrostomy tract 5/15 US  abdomen: Unremarkable 7/19 CT head: Encephalomalacia at site of prior hemorrhage   Significant microbiology data: 4/1 trach aspirate: MRSA 4/8 trach aspirate: MRSA 5/13 foley catheter urine: E coli 5/13 blood cx x2: no growth 5/14 trach aspirate: MRSA 6/1 blood cx x2: no growth 6/2 trach aspirate: MRSA 6/27 sputum: MRSA, klebsiella 7/7 trach aspirate: MRSA, candida 8/10 Urine cx: PsA  8/10 Blood culture x2: NGTD 8/10 trach aspirate: E coli and PsA  Subjective / 24h Interval events: Remains unresponsive  Assesement and Plan: Principal Problem:   ICH (intracerebral hemorrhage) (HCC) Active Problems:   Advanced care planning/counseling discussion   Poor prognosis   Tracheostomy status (HCC)   Pressure injury of skin   Goals of care, counseling/discussion   Chronic respiratory failure with hypoxia (HCC)   Acute respiratory failure (HCC)   Chronic hypoxic respiratory failure (HCC)   Pontine hemorrhage (HCC)   Principal problem 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 Tobie -Now able to tolerate trach collar for the last few days.  Pulmonary following intermittently for trach  management.  Appreciate follow-up,  respiratory status has remained stable   Recurrent fever 8/11, suspect pseudomonal/E. coli tracheitis - New fevers in the last week, cultures from blood, urine, tracheal aspirate showed colonization with Pseudomonas, also E. coli on trachea.  Discussed with pulmonology, they recommended empiric treatment, probably for tracheitis, possible CXR-negative pneumonia. -Has been placed on cefepime , today is day 5/14  NSVT - ~40 beats on 8/14, monitor magnesium and potassium, most recently normal. 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  Ophtho Dr. Waylan consulted on 8/11 and placed tarsorraphy on 8/11. - q2 moxifloxacin and moisturizing ointment -Ophthalmology will follow-up on Monday, appreciate input   Hypertension -continue medications as below, blood pressure acceptable   Normocytic anemia - Stable without bleeding   Class III obesity - BMI 42, complicates care  Scheduled Meds:  amLODipine   5 mg Per Tube Daily   artificial tears   Right Eye Q2H   Chlorhexidine  Gluconate Cloth  6 each Topical Daily   docusate  100 mg Per Tube BID   enoxaparin  (LOVENOX ) injection  40 mg Subcutaneous Daily   famotidine   20 mg Per Tube BID   feeding supplement (JEVITY 1.5 CAL/FIBER)  237 mL Per Tube 5 X Daily   feeding supplement (PROSource TF20)  60 mL Per Tube TID   free water   200 mL Per Tube BID   gatifloxacin   1 drop Left Eye Q2H   metoprolol  tartrate  75 mg Per Tube BID   multivitamin with minerals  1 tablet Per Tube Daily   mouth rinse  15 mL Mouth Rinse Q2H   polyethylene glycol  17 g Per Tube BID   sodium chloride  flush  10-40 mL Intracatheter Q12H   Continuous Infusions:  ceFEPime  (MAXIPIME ) IV 2 g (04/05/24 0626)   PRN Meds:.acetaminophen  **OR** acetaminophen  (TYLENOL ) oral liquid 160 mg/5 mL **OR** acetaminophen , bisacodyl , Gerhardt's butt cream,  hydrALAZINE , ipratropium-albuterol , mouth rinse, sodium chloride  flush, traZODone   Current Outpatient Medications  Medication Instructions   acetaminophen  (TYLENOL ) 1,000 mg, Oral, Every 6 hours PRN   ibuprofen (ADVIL) 600 mg, Oral, Every 6 hours PRN    Diet Orders (From admission, onward)     Start     Ordered   03/04/24 2359  Diet NPO time specified  Diet effective midnight        03/03/24 1402            DVT prophylaxis: enoxaparin  (LOVENOX ) injection 40 mg Start: 03/06/24 1000 SCDs Start: 10/27/23 2226   Lab Results  Component Value Date   PLT 588 (H) 04/03/2024      Code Status: Full Code  Family Communication: No family at bedside  Status is: Inpatient Remains inpatient appropriate because: Severity of illness  Level of care: Progressive  Consultants:  PCCM ENT Ophthalmology  Objective: Vitals:   04/05/24 0344 04/05/24 0434 04/05/24 0813 04/05/24 1027  BP: (!) 143/93  (!) 148/104 (!) 159/105  Pulse: 83  86 78  Resp: 18  19   Temp: 99.1 F (37.3 C)  98.4 F (36.9 C)   TempSrc: Axillary  Axillary   SpO2: 96% 96% 100% 98%  Weight:      Height:        Intake/Output Summary (Last 24 hours) at 04/05/2024 1106 Last data filed at 04/05/2024 0635 Gross per 24 hour  Intake 400 ml  Output 1300 ml  Net -900 ml   Wt Readings from Last 3 Encounters:  04/01/24 128 kg    Examination: Constitutional: Unresponsive ENMT: mmm Neck: normal, supple Respiratory: clear to auscultation bilaterally, no wheezing, no crackles. Cardiovascular: Regular rate and rhythm, no murmurs / rubs / gallops. Abdomen: soft, no distention, no tenderness. Bowel sounds positive.   Data Reviewed: I have independently reviewed following labs and imaging studies   CBC Recent Labs  Lab 03/31/24 1744 04/02/24 0436 04/03/24 0344  WBC 8.1 8.5 8.9  HGB 10.5* 10.6* 10.9*  HCT 34.8* 35.7* 37.0*  PLT 556* 529* 588*  MCV 89.5 90.6 91.4  MCH 27.0 26.9 26.9  MCHC 30.2 29.7* 29.5*   RDW 15.3 15.2 15.0    Recent Labs  Lab 03/31/24 1744 04/02/24 0436 04/03/24 0344 04/04/24 0837  NA 137 140 138  --   K 3.9 4.5 4.0 4.1  CL 101 102 99  --   CO2 26 30 31   --   GLUCOSE 125* 99 99  --   BUN 16 22* 21*  --   CREATININE 0.46* 0.37* 0.42*  --   CALCIUM 8.9 9.5 9.1  --   AST  --  44* 41  --   ALT  --  98* 97*  --   ALKPHOS  --  57 58  --   BILITOT  --  0.2 0.4  --   ALBUMIN   --  2.8* 2.8*  --   MG  --   --  2.2 2.0    ------------------------------------------------------------------------------------------------------------------ No results for input(s): CHOL, HDL, LDLCALC, TRIG, CHOLHDL, LDLDIRECT in the last 72 hours.  Lab Results  Component Value Date   HGBA1C 4.7 (L) 10/27/2023   ------------------------------------------------------------------------------------------------------------------ No results for input(s): TSH, T4TOTAL, T3FREE, THYROIDAB in the last 72 hours.  Invalid input(s): FREET3  Cardiac Enzymes No results for input(s): CKMB, TROPONINI, MYOGLOBIN in the last 168 hours.  Invalid input(s): CK ------------------------------------------------------------------------------------------------------------------    Component Value Date/Time   BNP 210.4 (H) 10/27/2023 1820    CBG: Recent Labs  Lab 03/30/24 0626 03/30/24 1107 03/30/24 2327 03/31/24 0327 03/31/24 0747  GLUCAP 94 104* 134* 105* 150*    Recent Results (from the past 240 hours)  Culture, Respiratory w Gram Stain     Status: None   Collection Time: 03/30/24 10:42 AM   Specimen: Tracheal Aspirate; Respiratory  Result Value Ref Range Status   Specimen Description TRACHEAL ASPIRATE  Final   Special Requests NONE  Final   Gram Stain   Final    ABUNDANT WBC PRESENT, PREDOMINANTLY PMN FEW GRAM NEGATIVE DIPLOCOCCI FEW GRAM POSITIVE COCCI RARE GRAM POSITIVE RODS FEW GRAM NEGATIVE RODS Performed at [facility], [address]  [place], [name] [address]    Culture   Final    FEW ESCHERICHIA COLI RARE PSEUDOMONAS AERUGINOSA    Report Status 04/02/2024 FINAL  Final   Organism ID, Bacteria ESCHERICHIA COLI  Final   Organism ID, Bacteria PSEUDOMONAS AERUGINOSA  Final      Susceptibility   Escherichia coli - MIC*    AMPICILLIN  >=32 RESISTANT Resistant     CEFAZOLIN  Value in next row Resistant      >=32 RESISTANTThis is a modified FDA-approved test that has been validated and its performance characteristics determined by the reporting laboratory.  This laboratory is certified under the Clinical Laboratory Improvement Amendments CLIA as qualified to perform high complexity clinical laboratory testing.    CEFEPIME  Value in next row Sensitive      >=  32 RESISTANTThis is a modified FDA-approved test that has been validated and its performance characteristics determined by the reporting laboratory.  This laboratory is certified under the Clinical Laboratory Improvement Amendments CLIA as qualified to perform high complexity clinical laboratory testing.    ERTAPENEM Value in next row Sensitive      >=32 RESISTANTThis is a modified FDA-approved test that has been validated and its performance characteristics determined by the reporting laboratory.  This laboratory is certified under the Clinical Laboratory Improvement Amendments CLIA as qualified to perform high complexity clinical laboratory testing.    CEFTRIAXONE  Value in next row Sensitive      >=32 RESISTANTThis is a modified FDA-approved test that has been validated and its performance characteristics determined by the reporting laboratory.  This laboratory is certified under the Clinical Laboratory Improvement Amendments CLIA as qualified to perform high complexity clinical laboratory testing.    CIPROFLOXACIN  Value in next row Sensitive      >=32 RESISTANTThis is a modified FDA-approved test that has been validated and its performance characteristics determined by the reporting  laboratory.  This laboratory is certified under the Clinical Laboratory Improvement Amendments CLIA as qualified to perform high complexity clinical laboratory testing.    GENTAMICIN Value in next row Sensitive      >=32 RESISTANTThis is a modified FDA-approved test that has been validated and its performance characteristics determined by the reporting laboratory.  This laboratory is certified under the Clinical Laboratory Improvement Amendments CLIA as qualified to perform high complexity clinical laboratory testing.    MEROPENEM Value in next row Sensitive      >=32 RESISTANTThis is a modified FDA-approved test that has been validated and its performance characteristics determined by the reporting laboratory.  This laboratory is certified under the Clinical Laboratory Improvement Amendments CLIA as qualified to perform high complexity clinical laboratory testing.    TRIMETH/SULFA Value in next row Sensitive      >=32 RESISTANTThis is a modified FDA-approved test that has been validated and its performance characteristics determined by the reporting laboratory.  This laboratory is certified under the Clinical Laboratory Improvement Amendments CLIA as qualified to perform high complexity clinical laboratory testing.    AMPICILLIN /SULBACTAM Value in next row Resistant      >=32 RESISTANTThis is a modified FDA-approved test that has been validated and its performance characteristics determined by the reporting laboratory.  This laboratory is certified under the Clinical Laboratory Improvement Amendments CLIA as qualified to perform high complexity clinical laboratory testing.    PIP/TAZO Value in next row Resistant ug/mL     >=128 RESISTANTThis is a modified FDA-approved test that has been validated and its performance characteristics determined by the reporting laboratory.  This laboratory is certified under the Clinical Laboratory Improvement Amendments CLIA as qualified to perform high complexity clinical  laboratory testing.    * FEW ESCHERICHIA COLI   Pseudomonas aeruginosa - MIC*    MEROPENEM Value in next row Sensitive      >=128 RESISTANTThis is a modified FDA-approved test that has been validated and its performance characteristics determined by the reporting laboratory.  This laboratory is certified under the Clinical Laboratory Improvement Amendments CLIA as qualified to perform high complexity clinical laboratory testing.    CIPROFLOXACIN  Value in next row Sensitive      >=128 RESISTANTThis is a modified FDA-approved test that has been validated and its performance characteristics determined by the reporting laboratory.  This laboratory is certified under the Clinical Laboratory Improvement Amendments CLIA as qualified to  perform high complexity clinical laboratory testing.    IMIPENEM Value in next row Sensitive      >=128 RESISTANTThis is a modified FDA-approved test that has been validated and its performance characteristics determined by the reporting laboratory.  This laboratory is certified under the Clinical Laboratory Improvement Amendments CLIA as qualified to perform high complexity clinical laboratory testing.    PIP/TAZO Value in next row Sensitive ug/mL     16 SENSITIVEThis is a modified FDA-approved test that has been validated and its performance characteristics determined by the reporting laboratory.  This laboratory is certified under the Clinical Laboratory Improvement Amendments CLIA as qualified to perform high complexity clinical laboratory testing.    CEFEPIME  Value in next row Sensitive      16 SENSITIVEThis is a modified FDA-approved test that has been validated and its performance characteristics determined by the reporting laboratory.  This laboratory is certified under the Clinical Laboratory Improvement Amendments CLIA as qualified to perform high complexity clinical laboratory testing.    CEFTAZIDIME/AVIBACTAM Value in next row Sensitive ug/mL     16 SENSITIVEThis is  a modified FDA-approved test that has been validated and its performance characteristics determined by the reporting laboratory.  This laboratory is certified under the Clinical Laboratory Improvement Amendments CLIA as qualified to perform high complexity clinical laboratory testing.    CEFTOLOZANE/TAZOBACTAM Value in next row Sensitive ug/mL     16 SENSITIVEThis is a modified FDA-approved test that has been validated and its performance characteristics determined by the reporting laboratory.  This laboratory is certified under the Clinical Laboratory Improvement Amendments CLIA as qualified to perform high complexity clinical laboratory testing.    TOBRAMYCIN Value in next row Sensitive      16 SENSITIVEThis is a modified FDA-approved test that has been validated and its performance characteristics determined by the reporting laboratory.  This laboratory is certified under the Clinical Laboratory Improvement Amendments CLIA as qualified to perform high complexity clinical laboratory testing.    CEFTAZIDIME Value in next row Sensitive      16 SENSITIVEThis is a modified FDA-approved test that has been validated and its performance characteristics determined by the reporting laboratory.  This laboratory is certified under the Clinical Laboratory Improvement Amendments CLIA as qualified to perform high complexity clinical laboratory testing.    * RARE PSEUDOMONAS AERUGINOSA  Culture, blood (Routine X 2) w Reflex to ID Panel     Status: None   Collection Time: 03/30/24 11:44 AM   Specimen: BLOOD LEFT HAND  Result Value Ref Range Status   Specimen Description BLOOD LEFT HAND  Final   Special Requests   Final    BOTTLES DRAWN AEROBIC ONLY Blood Culture results may not be optimal due to an inadequate volume of blood received in culture bottles   Culture   Final    NO GROWTH 5 DAYS Performed at [facility], [address] [place], [name] [address]    Report Status 04/04/2024 FINAL  Final   Culture, blood (Routine X 2) w Reflex to ID Panel     Status: None   Collection Time: 03/30/24 11:44 AM   Specimen: BLOOD LEFT HAND  Result Value Ref Range Status   Specimen Description BLOOD LEFT HAND  Final   Special Requests   Final    BOTTLES DRAWN AEROBIC AND ANAEROBIC Blood Culture results may not be optimal due to an inadequate volume of blood received in culture bottles   Culture   Final    NO  GROWTH 5 DAYS Performed at [facility], [address] [place], [name] [address]    Report Status 04/04/2024 FINAL  Final  Remove and replace urinary cath (placed > 5 days) then obtain urine culture from new indwelling urinary catheter.     Status: Abnormal   Collection Time: 03/30/24  4:51 PM   Specimen: Urine, Catheterized  Result Value Ref Range Status   Specimen Description URINE, CATHETERIZED  Final   Special Requests   Final    NONE Performed at [facility], [address] [place], [name] [address]    Culture 80,000 COLONIES/mL PSEUDOMONAS AERUGINOSA (A)  Final   Report Status 04/01/2024 FINAL  Final   Organism ID, Bacteria PSEUDOMONAS AERUGINOSA (A)  Final      Susceptibility   Pseudomonas aeruginosa - MIC*    MEROPENEM <=0.25 SENSITIVE Sensitive     CIPROFLOXACIN  0.12 SENSITIVE Sensitive     IMIPENEM 1 SENSITIVE Sensitive     PIP/TAZO Value in next row Sensitive ug/mL     <=4 SENSITIVEThis is a modified FDA-approved test that has been validated and its performance characteristics determined by the reporting laboratory.  This laboratory is certified under the Clinical Laboratory Improvement Amendments CLIA as qualified to perform high complexity clinical laboratory testing.    CEFEPIME  Value in next row Sensitive      <=4 SENSITIVEThis is a modified FDA-approved test that has been validated and its performance characteristics determined by the reporting laboratory.  This laboratory is certified under the Clinical Laboratory Improvement Amendments CLIA as  qualified to perform high complexity clinical laboratory testing.    CEFTAZIDIME/AVIBACTAM Value in next row Sensitive ug/mL     <=4 SENSITIVEThis is a modified FDA-approved test that has been validated and its performance characteristics determined by the reporting laboratory.  This laboratory is certified under the Clinical Laboratory Improvement Amendments CLIA as qualified to perform high complexity clinical laboratory testing.    CEFTOLOZANE/TAZOBACTAM Value in next row Sensitive ug/mL     <=4 SENSITIVEThis is a modified FDA-approved test that has been validated and its performance characteristics determined by the reporting laboratory.  This laboratory is certified under the Clinical Laboratory Improvement Amendments CLIA as qualified to perform high complexity clinical laboratory testing.    TOBRAMYCIN Value in next row Sensitive      <=4 SENSITIVEThis is a modified FDA-approved test that has been validated and its performance characteristics determined by the reporting laboratory.  This laboratory is certified under the Clinical Laboratory Improvement Amendments CLIA as qualified to perform high complexity clinical laboratory testing.    CEFTAZIDIME Value in next row Sensitive      <=4 SENSITIVEThis is a modified FDA-approved test that has been validated and its performance characteristics determined by the reporting laboratory.  This laboratory is certified under the Clinical Laboratory Improvement Amendments CLIA as qualified to perform high complexity clinical laboratory testing.    * 80,000 COLONIES/mL PSEUDOMONAS AERUGINOSA     Radiology Studies: No results found.    Nilda Fendt, MD, PhD [facility]  Between 7 am - 7 pm I am available, please contact me via Amion (for emergencies) or Securechat (non urgent messages)  Between 7 pm - 7 am I am not available, please contact night coverage MD/APP via Amion

## 2024-04-06 DIAGNOSIS — I619 Nontraumatic intracerebral hemorrhage, unspecified: Secondary | ICD-10-CM | POA: Diagnosis not present

## 2024-04-06 LAB — COMPREHENSIVE METABOLIC PANEL WITH GFR
ALT: 74 U/L — ABNORMAL HIGH (ref 0–44)
AST: 31 U/L (ref 15–41)
Albumin: 2.8 g/dL — ABNORMAL LOW (ref 3.5–5.0)
Alkaline Phosphatase: 54 U/L (ref 38–126)
Anion gap: 10 (ref 5–15)
BUN: 18 mg/dL (ref 6–20)
CO2: 33 mmol/L — ABNORMAL HIGH (ref 22–32)
Calcium: 9.4 mg/dL (ref 8.9–10.3)
Chloride: 97 mmol/L — ABNORMAL LOW (ref 98–111)
Creatinine, Ser: 0.48 mg/dL — ABNORMAL LOW (ref 0.61–1.24)
GFR, Estimated: >60 mL/min (ref >60)
Glucose, Bld: 118 mg/dL — ABNORMAL HIGH (ref 70–99)
Potassium: 4.1 mmol/L (ref 3.5–5.1)
Sodium: 140 mmol/L (ref 135–145)
Total Bilirubin: 0.2 mg/dL (ref 0.0–1.2)
Total Protein: 7.6 g/dL (ref 6.5–8.1)

## 2024-04-06 LAB — CBC
HCT: 35.8 % — ABNORMAL LOW (ref 39.0–52.0)
Hemoglobin: 10.6 g/dL — ABNORMAL LOW (ref 13.0–17.0)
MCH: 27.1 pg (ref 26.0–34.0)
MCHC: 29.6 g/dL — ABNORMAL LOW (ref 30.0–36.0)
MCV: 91.6 fL (ref 80.0–100.0)
Platelets: 592 K/uL — ABNORMAL HIGH (ref 150–400)
RBC: 3.91 MIL/uL — ABNORMAL LOW (ref 4.22–5.81)
RDW: 15.1 % (ref 11.5–15.5)
WBC: 11.1 K/uL — ABNORMAL HIGH (ref 4.0–10.5)
nRBC: 0 % (ref 0.0–0.2)

## 2024-04-06 LAB — MAGNESIUM: Magnesium: 2.3 mg/dL (ref 1.7–2.4)

## 2024-04-06 LAB — GLUCOSE, CAPILLARY: Glucose-Capillary: 153 mg/dL — ABNORMAL HIGH (ref 70–99)

## 2024-04-06 MED ORDER — ORAL CARE MOUTH RINSE
15.0000 mL | OROMUCOSAL | Status: DC
  Administered 2024-04-06 – 2024-04-09 (×12): 15 mL via OROMUCOSAL

## 2024-04-06 MED ORDER — ORAL CARE MOUTH RINSE: 15.0000 mL | OROMUCOSAL | Status: DC | PRN

## 2024-04-06 NOTE — Progress Notes (Signed)
 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  He has had a prolonged hospitalization, admitted in March 2025.  Hospital course complicated by persistent vegetative state, recurrent pneumonias, now has a tracheostomy and a PEG tube  Significant events: 3/8: Intubated in ED, pontine bleed/SAH. CT head Acute large hemorrhage 4.1 cm in pons with intraventricular extension without hydrocephalus. 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  Restarted antibiotics.  Urine culture + E. Coli, foley catheter changed 5/18: Completed antibiotics for UTI 5/22: Transferred [facility] 6/11: De-cannulated 01/29/2024 6/27: Placed on 3 L oxygen overnight, tachypnea, increased secretion. Started IV Unasyn . CCM re-consulted.  7/7: Transferred back to ICU; Re-intubated 7/8: ENT consulted for trach 7/16: Trach replaced by ENT 7/17-8/5: Slow vent weaning with low success 8/6: Transferred back to hospitalist service still on vent 8/7: Not able to wean to trach collar 8/8: Only on trach collar a few hours 8/9: Only on trach collar briefly, then had to go back on  mechanical ventilation 8/11: Tolerating ATC 24 hours, Ophtho reconsulted, placed tarsorrhaphy    Significant studies: 3/8 CT head on admission: 4.1 cm hemorrhage centered on pons with intraventricular extension into the 4th ventricle and basal cisterns and small [facility] 3/9 CTA head and neck: no change in IPH and [facility] 3/17 CT head: interval decrease in IPH 3/29 CT head: evolving unremarkably 5/10 CT abdomen: Foley catheter through gastrostomy tract 5/15 US  abdomen: Unremarkable 7/19 CT head: Encephalomalacia at site of prior hemorrhage   Significant microbiology data: 4/1 trach aspirate: MRSA 4/8 trach aspirate: MRSA 5/13 foley catheter urine: E coli 5/13 blood cx x2: no growth 5/14 trach aspirate: MRSA 6/1 blood cx x2: no growth 6/2 trach aspirate: MRSA 6/27 sputum: MRSA, klebsiella 7/7 trach aspirate: MRSA, candida 8/10 Urine cx: PsA  8/10 Blood culture x2: NGTD 8/10 trach aspirate: E coli and PsA  Subjective / 24h Interval events: Unresponsive  Assesement and Plan: Principal Problem:   ICH (intracerebral hemorrhage) (HCC) Active Problems:   Advanced care planning/counseling discussion   Poor prognosis   Tracheostomy status (HCC)   Pressure injury of skin   Goals of care, counseling/discussion   Chronic respiratory failure with hypoxia (HCC)   Acute respiratory failure (HCC)   Chronic hypoxic respiratory failure (HCC)   Pontine hemorrhage (HCC)   Principal problem 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 Tobie -Now able to tolerate trach collar for the last few days.  Pulmonary following intermittently for trach management.  Appreciate follow-up, respiratory  status has remained stable   Recurrent fever 8/11, suspect pseudomonal/E. coli tracheitis - New fevers in the last week, cultures from blood, urine, tracheal aspirate showed colonization with Pseudomonas, also E. coli on trachea.  Discussed with pulmonology, they recommended empiric treatment, probably for tracheitis, possible CXR-negative pneumonia. -Has been placed on cefepime , today is day 6/14  NSVT - ~40 beats on 8/14, monitor magnesium and potassium, most recently normal. 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  Ophtho Dr. Waylan consulted on 8/11 and placed tarsorraphy on 8/11. - q2 moxifloxacin and moisturizing ointment -Ophthalmology will follow-up on Monday, appreciate input   Hypertension -continue medications as below, blood pressure acceptable   Normocytic anemia - Stable without bleeding   Class III obesity - BMI 42, complicates care  Scheduled Meds:  amLODipine   5 mg Per Tube Daily   artificial tears   Right Eye Q2H   Chlorhexidine  Gluconate Cloth  6 each Topical Daily   docusate  100 mg Per Tube BID   enoxaparin  (LOVENOX ) injection  40 mg Subcutaneous Daily   famotidine   20 mg Per Tube BID   feeding supplement (JEVITY 1.5 CAL/FIBER)  237 mL Per Tube 5 X Daily   feeding supplement (PROSource TF20)  60 mL Per Tube TID   free water   200 mL Per Tube BID   gatifloxacin   1 drop Left Eye Q2H   metoprolol  tartrate  75 mg Per Tube BID   multivitamin with minerals  1 tablet Per Tube Daily   mouth rinse  15 mL Mouth Rinse 4 times per day   polyethylene glycol  17 g Per Tube BID   sodium chloride  flush  10-40 mL Intracatheter Q12H   Continuous Infusions:  ceFEPime  (MAXIPIME ) IV 2 g (04/06/24 9361)   PRN Meds:.acetaminophen  **OR** acetaminophen  (TYLENOL ) oral liquid 160 mg/5 mL **OR** acetaminophen , bisacodyl , Gerhardt's butt cream,  hydrALAZINE , ipratropium-albuterol , mouth rinse, sodium chloride  flush, traZODone   Current Outpatient Medications  Medication Instructions   acetaminophen  (TYLENOL ) 1,000 mg, Oral, Every 6 hours PRN   ibuprofen (ADVIL) 600 mg, Oral, Every 6 hours PRN    Diet Orders (From admission, onward)     Start     Ordered   03/04/24 2359  Diet NPO time specified  Diet effective midnight        03/03/24 1402            DVT prophylaxis: enoxaparin  (LOVENOX ) injection 40 mg Start: 03/06/24 1000 SCDs Start: 10/27/23 2226   Lab Results  Component Value Date   PLT 592 (H) 04/06/2024      Code Status: Full Code  Family Communication: No family at bedside  Status is: Inpatient Remains inpatient appropriate because: Severity of illness  Level of care: Progressive  Consultants:  PCCM ENT Ophthalmology  Objective: Vitals:   04/06/24 0132 04/06/24 0502 04/06/24 0830 04/06/24 1231  BP:  (!) 163/96 (!) 151/95 (!) 164/94  Pulse:  79 77 78  Resp:  18 18 18   Temp:  98.8 F (37.1 C) 97.6 F (36.4 C) (!) 97.4 F (36.3 C)  TempSrc:  Axillary Axillary Axillary  SpO2:  98% 100% 99%  Weight: 129 kg     Height:        Intake/Output Summary (Last 24 hours) at 04/06/2024 1319 Last data filed at 04/06/2024 0636 Gross per 24 hour  Intake 200  ml  Output 950 ml  Net -750 ml   Wt Readings from Last 3 Encounters:  04/06/24 129 kg    Examination: Constitutional: Unresponsive, appears comfortable  Data Reviewed: I have independently reviewed following labs and imaging studies   CBC Recent Labs  Lab 03/31/24 1744 04/02/24 0436 04/03/24 0344 04/06/24 0523  WBC 8.1 8.5 8.9 11.1*  HGB 10.5* 10.6* 10.9* 10.6*  HCT 34.8* 35.7* 37.0* 35.8*  PLT 556* 529* 588* 592*  MCV 89.5 90.6 91.4 91.6  MCH 27.0 26.9 26.9 27.1  MCHC 30.2 29.7* 29.5* 29.6*  RDW 15.3 15.2 15.0 15.1    Recent Labs  Lab 03/31/24 1744 04/02/24 0436 04/03/24 0344 04/04/24 0837 04/06/24 0523  NA 137 140 138   --  140  K 3.9 4.5 4.0 4.1 4.1  CL 101 102 99  --  97*  CO2 26 30 31   --  33*  GLUCOSE 125* 99 99  --  118*  BUN 16 22* 21*  --  18  CREATININE 0.46* 0.37* 0.42*  --  0.48*  CALCIUM 8.9 9.5 9.1  --  9.4  AST  --  44* 41  --  31  ALT  --  98* 97*  --  74*  ALKPHOS  --  57 58  --  54  BILITOT  --  0.2 0.4  --  0.2  ALBUMIN   --  2.8* 2.8*  --  2.8*  MG  --   --  2.2 2.0 2.3    ------------------------------------------------------------------------------------------------------------------ No results for input(s): CHOL, HDL, LDLCALC, TRIG, CHOLHDL, LDLDIRECT in the last 72 hours.  Lab Results  Component Value Date   HGBA1C 4.7 (L) 10/27/2023   ------------------------------------------------------------------------------------------------------------------ No results for input(s): TSH, T4TOTAL, T3FREE, THYROIDAB in the last 72 hours.  Invalid input(s): FREET3  Cardiac Enzymes No results for input(s): CKMB, TROPONINI, MYOGLOBIN in the last 168 hours.  Invalid input(s): CK ------------------------------------------------------------------------------------------------------------------    Component Value Date/Time   BNP 210.4 (H) 10/27/2023 1820    CBG: Recent Labs  Lab 03/30/24 2327 03/31/24 0327 03/31/24 0747 04/06/24 0001  GLUCAP 134* 105* 150* 153*    Recent Results (from the past 240 hours)  Culture, Respiratory w Gram Stain     Status: None   Collection Time: 03/30/24 10:42 AM   Specimen: Tracheal Aspirate; Respiratory  Result Value Ref Range Status   Specimen Description TRACHEAL ASPIRATE  Final   Special Requests NONE  Final   Gram Stain   Final    ABUNDANT WBC PRESENT, PREDOMINANTLY PMN FEW GRAM NEGATIVE DIPLOCOCCI FEW GRAM POSITIVE COCCI RARE GRAM POSITIVE RODS FEW GRAM NEGATIVE RODS Performed at [facility], [address] [place], [name] [address]    Culture   Final    FEW ESCHERICHIA COLI RARE PSEUDOMONAS  AERUGINOSA    Report Status 04/02/2024 FINAL  Final   Organism ID, Bacteria ESCHERICHIA COLI  Final   Organism ID, Bacteria PSEUDOMONAS AERUGINOSA  Final      Susceptibility   Escherichia coli - MIC*    AMPICILLIN  >=32 RESISTANT Resistant     CEFAZOLIN  Value in next row Resistant      >=32 RESISTANTThis is a modified FDA-approved test that has been validated and its performance characteristics determined by the reporting laboratory.  This laboratory is certified under the Clinical Laboratory Improvement Amendments CLIA as qualified to perform high complexity clinical laboratory testing.    CEFEPIME  Value in next row Sensitive      >=32 RESISTANTThis  is a modified FDA-approved test that has been validated and its performance characteristics determined by the reporting laboratory.  This laboratory is certified under the Clinical Laboratory Improvement Amendments CLIA as qualified to perform high complexity clinical laboratory testing.    ERTAPENEM Value in next row Sensitive      >=32 RESISTANTThis is a modified FDA-approved test that has been validated and its performance characteristics determined by the reporting laboratory.  This laboratory is certified under the Clinical Laboratory Improvement Amendments CLIA as qualified to perform high complexity clinical laboratory testing.    CEFTRIAXONE  Value in next row Sensitive      >=32 RESISTANTThis is a modified FDA-approved test that has been validated and its performance characteristics determined by the reporting laboratory.  This laboratory is certified under the Clinical Laboratory Improvement Amendments CLIA as qualified to perform high complexity clinical laboratory testing.    CIPROFLOXACIN  Value in next row Sensitive      >=32 RESISTANTThis is a modified FDA-approved test that has been validated and its performance characteristics determined by the reporting laboratory.  This laboratory is certified under the Clinical Laboratory Improvement  Amendments CLIA as qualified to perform high complexity clinical laboratory testing.    GENTAMICIN Value in next row Sensitive      >=32 RESISTANTThis is a modified FDA-approved test that has been validated and its performance characteristics determined by the reporting laboratory.  This laboratory is certified under the Clinical Laboratory Improvement Amendments CLIA as qualified to perform high complexity clinical laboratory testing.    MEROPENEM Value in next row Sensitive      >=32 RESISTANTThis is a modified FDA-approved test that has been validated and its performance characteristics determined by the reporting laboratory.  This laboratory is certified under the Clinical Laboratory Improvement Amendments CLIA as qualified to perform high complexity clinical laboratory testing.    TRIMETH/SULFA Value in next row Sensitive      >=32 RESISTANTThis is a modified FDA-approved test that has been validated and its performance characteristics determined by the reporting laboratory.  This laboratory is certified under the Clinical Laboratory Improvement Amendments CLIA as qualified to perform high complexity clinical laboratory testing.    AMPICILLIN /SULBACTAM Value in next row Resistant      >=32 RESISTANTThis is a modified FDA-approved test that has been validated and its performance characteristics determined by the reporting laboratory.  This laboratory is certified under the Clinical Laboratory Improvement Amendments CLIA as qualified to perform high complexity clinical laboratory testing.    PIP/TAZO Value in next row Resistant ug/mL     >=128 RESISTANTThis is a modified FDA-approved test that has been validated and its performance characteristics determined by the reporting laboratory.  This laboratory is certified under the Clinical Laboratory Improvement Amendments CLIA as qualified to perform high complexity clinical laboratory testing.    * FEW ESCHERICHIA COLI   Pseudomonas aeruginosa - MIC*     MEROPENEM Value in next row Sensitive      >=128 RESISTANTThis is a modified FDA-approved test that has been validated and its performance characteristics determined by the reporting laboratory.  This laboratory is certified under the Clinical Laboratory Improvement Amendments CLIA as qualified to perform high complexity clinical laboratory testing.    CIPROFLOXACIN  Value in next row Sensitive      >=128 RESISTANTThis is a modified FDA-approved test that has been validated and its performance characteristics determined by the reporting laboratory.  This laboratory is certified under the Clinical Laboratory Improvement Amendments CLIA as qualified to perform high  complexity clinical laboratory testing.    IMIPENEM Value in next row Sensitive      >=128 RESISTANTThis is a modified FDA-approved test that has been validated and its performance characteristics determined by the reporting laboratory.  This laboratory is certified under the Clinical Laboratory Improvement Amendments CLIA as qualified to perform high complexity clinical laboratory testing.    PIP/TAZO Value in next row Sensitive ug/mL     16 SENSITIVEThis is a modified FDA-approved test that has been validated and its performance characteristics determined by the reporting laboratory.  This laboratory is certified under the Clinical Laboratory Improvement Amendments CLIA as qualified to perform high complexity clinical laboratory testing.    CEFEPIME  Value in next row Sensitive      16 SENSITIVEThis is a modified FDA-approved test that has been validated and its performance characteristics determined by the reporting laboratory.  This laboratory is certified under the Clinical Laboratory Improvement Amendments CLIA as qualified to perform high complexity clinical laboratory testing.    CEFTAZIDIME/AVIBACTAM Value in next row Sensitive ug/mL     16 SENSITIVEThis is a modified FDA-approved test that has been validated and its performance  characteristics determined by the reporting laboratory.  This laboratory is certified under the Clinical Laboratory Improvement Amendments CLIA as qualified to perform high complexity clinical laboratory testing.    CEFTOLOZANE/TAZOBACTAM Value in next row Sensitive ug/mL     16 SENSITIVEThis is a modified FDA-approved test that has been validated and its performance characteristics determined by the reporting laboratory.  This laboratory is certified under the Clinical Laboratory Improvement Amendments CLIA as qualified to perform high complexity clinical laboratory testing.    TOBRAMYCIN Value in next row Sensitive      16 SENSITIVEThis is a modified FDA-approved test that has been validated and its performance characteristics determined by the reporting laboratory.  This laboratory is certified under the Clinical Laboratory Improvement Amendments CLIA as qualified to perform high complexity clinical laboratory testing.    CEFTAZIDIME Value in next row Sensitive      16 SENSITIVEThis is a modified FDA-approved test that has been validated and its performance characteristics determined by the reporting laboratory.  This laboratory is certified under the Clinical Laboratory Improvement Amendments CLIA as qualified to perform high complexity clinical laboratory testing.    * RARE PSEUDOMONAS AERUGINOSA  Culture, blood (Routine X 2) w Reflex to ID Panel     Status: None   Collection Time: 03/30/24 11:44 AM   Specimen: BLOOD LEFT HAND  Result Value Ref Range Status   Specimen Description BLOOD LEFT HAND  Final   Special Requests   Final    BOTTLES DRAWN AEROBIC ONLY Blood Culture results may not be optimal due to an inadequate volume of blood received in culture bottles   Culture   Final    NO GROWTH 5 DAYS Performed at [facility], [address] [place], [name] [address]    Report Status 04/04/2024 FINAL  Final  Culture, blood (Routine X 2) w Reflex to ID Panel     Status: None    Collection Time: 03/30/24 11:44 AM   Specimen: BLOOD LEFT HAND  Result Value Ref Range Status   Specimen Description BLOOD LEFT HAND  Final   Special Requests   Final    BOTTLES DRAWN AEROBIC AND ANAEROBIC Blood Culture results may not be optimal due to an inadequate volume of blood received in culture bottles   Culture   Final    NO GROWTH 5  DAYS Performed at [facility], [address], [place], [name] [address]    Report Status 04/04/2024 FINAL  Final  Remove and replace urinary cath (placed > 5 days) then obtain urine culture from new indwelling urinary catheter.     Status: Abnormal   Collection Time: 03/30/24  4:51 PM   Specimen: Urine, Catheterized  Result Value Ref Range Status   Specimen Description URINE, CATHETERIZED  Final   Special Requests   Final    NONE Performed at [facility], [address] [place], [name] [address]    Culture 80,000 COLONIES/mL PSEUDOMONAS AERUGINOSA (A)  Final   Report Status 04/01/2024 FINAL  Final   Organism ID, Bacteria PSEUDOMONAS AERUGINOSA (A)  Final      Susceptibility   Pseudomonas aeruginosa - MIC*    MEROPENEM <=0.25 SENSITIVE Sensitive     CIPROFLOXACIN  0.12 SENSITIVE Sensitive     IMIPENEM 1 SENSITIVE Sensitive     PIP/TAZO Value in next row Sensitive ug/mL     <=4 SENSITIVEThis is a modified FDA-approved test that has been validated and its performance characteristics determined by the reporting laboratory.  This laboratory is certified under the Clinical Laboratory Improvement Amendments CLIA as qualified to perform high complexity clinical laboratory testing.    CEFEPIME  Value in next row Sensitive      <=4 SENSITIVEThis is a modified FDA-approved test that has been validated and its performance characteristics determined by the reporting laboratory.  This laboratory is certified under the Clinical Laboratory Improvement Amendments CLIA as qualified to perform high complexity clinical laboratory testing.     CEFTAZIDIME/AVIBACTAM Value in next row Sensitive ug/mL     <=4 SENSITIVEThis is a modified FDA-approved test that has been validated and its performance characteristics determined by the reporting laboratory.  This laboratory is certified under the Clinical Laboratory Improvement Amendments CLIA as qualified to perform high complexity clinical laboratory testing.    CEFTOLOZANE/TAZOBACTAM Value in next row Sensitive ug/mL     <=4 SENSITIVEThis is a modified FDA-approved test that has been validated and its performance characteristics determined by the reporting laboratory.  This laboratory is certified under the Clinical Laboratory Improvement Amendments CLIA as qualified to perform high complexity clinical laboratory testing.    TOBRAMYCIN Value in next row Sensitive      <=4 SENSITIVEThis is a modified FDA-approved test that has been validated and its performance characteristics determined by the reporting laboratory.  This laboratory is certified under the Clinical Laboratory Improvement Amendments CLIA as qualified to perform high complexity clinical laboratory testing.    CEFTAZIDIME Value in next row Sensitive      <=4 SENSITIVEThis is a modified FDA-approved test that has been validated and its performance characteristics determined by the reporting laboratory.  This laboratory is certified under the Clinical Laboratory Improvement Amendments CLIA as qualified to perform high complexity clinical laboratory testing.    * 80,000 COLONIES/mL PSEUDOMONAS AERUGINOSA     Radiology Studies: No results found.    Nilda Fendt, MD, PhD [facility]  Between 7 am - 7 pm I am available, please contact me via Amion (for emergencies) or Securechat (non urgent messages)  Between 7 pm - 7 am I am not available, please contact night coverage MD/APP via Amion

## 2024-04-06 NOTE — Progress Notes (Signed)
 Patients MEWS red due to tachycardia/tachypnea. RT paged and arrived at bedside to do respiratory assessment and suction trach.   04/06/24 1800  Assess: MEWS Score  Temp 98.9 F (37.2 C)  BP (!) 148/96  MAP (mmHg) 110  ECG Heart Rate (!) 122  Resp (!) 35  Level of Consciousness Responds to Voice  SpO2 97 %  O2 Device Tracheostomy Collar  O2 Flow Rate (L/min) 6 L/min  FiO2 (%) 28 %  Assess: MEWS Score  MEWS Temp 0  MEWS Systolic 0  MEWS Pulse 2  MEWS RR 2  MEWS LOC 1  MEWS Score 5  MEWS Score Color Red  Assess: if the MEWS score is Yellow or Red  Were vital signs accurate and taken at a resting state? Yes  MEWS guidelines implemented  Yes, red  Treat  MEWS Interventions Considered administering scheduled or prn medications/treatments as ordered  Take Vital Signs  Increase Vital Sign Frequency  Red: Q1hr x2, continue Q4hrs until patient remains green for 12hrs  Escalate  MEWS: Escalate Red: Discuss with charge nurse and notify provider. Consider notifying RRT. If remains red for 2 hours consider need for higher level of care  Provider Notification  Provider Name/Title Dr Trixie  Date Provider Notified 04/06/24  Time Provider Notified 1830  Method of Notification Page  Notification Reason Change in status;Other (Comment) (Red MEWS)  Provider response Evaluate remotely  Date of Provider Response 04/06/24  Time of Provider Response 1845  Assess: SIRS CRITERIA  SIRS Temperature  0  SIRS Respirations  1  SIRS Pulse 1  SIRS WBC 0  SIRS Score Sum  2

## 2024-04-07 DIAGNOSIS — I619 Nontraumatic intracerebral hemorrhage, unspecified: Secondary | ICD-10-CM | POA: Diagnosis not present

## 2024-04-07 LAB — CBC
HCT: 35.9 % — ABNORMAL LOW (ref 39.0–52.0)
Hemoglobin: 10.6 g/dL — ABNORMAL LOW (ref 13.0–17.0)
MCH: 27.4 pg (ref 26.0–34.0)
MCHC: 29.5 g/dL — ABNORMAL LOW (ref 30.0–36.0)
MCV: 92.8 fL (ref 80.0–100.0)
Platelets: 653 K/uL — ABNORMAL HIGH (ref 150–400)
RBC: 3.87 MIL/uL — ABNORMAL LOW (ref 4.22–5.81)
RDW: 15.4 % (ref 11.5–15.5)
WBC: 10.5 K/uL (ref 4.0–10.5)
nRBC: 0 % (ref 0.0–0.2)

## 2024-04-07 LAB — COMPREHENSIVE METABOLIC PANEL WITH GFR
ALT: 71 U/L — ABNORMAL HIGH (ref 0–44)
AST: 31 U/L (ref 15–41)
Albumin: 2.8 g/dL — ABNORMAL LOW (ref 3.5–5.0)
Alkaline Phosphatase: 58 U/L (ref 38–126)
Anion gap: 8 (ref 5–15)
BUN: 22 mg/dL — ABNORMAL HIGH (ref 6–20)
CO2: 34 mmol/L — ABNORMAL HIGH (ref 22–32)
Calcium: 9.5 mg/dL (ref 8.9–10.3)
Chloride: 100 mmol/L (ref 98–111)
Creatinine, Ser: 0.45 mg/dL — ABNORMAL LOW (ref 0.61–1.24)
GFR, Estimated: >60 mL/min (ref >60)
Glucose, Bld: 122 mg/dL — ABNORMAL HIGH (ref 70–99)
Potassium: 4.3 mmol/L (ref 3.5–5.1)
Sodium: 142 mmol/L (ref 135–145)
Total Bilirubin: 0.3 mg/dL (ref 0.0–1.2)
Total Protein: 7.5 g/dL (ref 6.5–8.1)

## 2024-04-07 LAB — MAGNESIUM: Magnesium: 2.4 mg/dL (ref 1.7–2.4)

## 2024-04-07 NOTE — Progress Notes (Signed)
 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  He has had a prolonged hospitalization, admitted in March 2025.  Hospital course complicated by persistent vegetative state, recurrent pneumonias, now has a tracheostomy and a PEG tube  Significant events: 3/8: Intubated in ED, pontine bleed/SAH. CT head Acute large hemorrhage 4.1 cm in pons with intraventricular extension without hydrocephalus. 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  Restarted antibiotics.  Urine culture + E. Coli, foley catheter changed 5/18: Completed antibiotics for UTI 5/22: Transferred [facility] 6/11: De-cannulated 01/29/2024 6/27: Placed on 3 L oxygen overnight, tachypnea, increased secretion. Started IV Unasyn . CCM re-consulted.  7/7: Transferred back to ICU; Re-intubated 7/8: ENT consulted for trach 7/16: Trach replaced by ENT 7/17-8/5: Slow vent weaning with low success 8/6: Transferred back to hospitalist service still on vent 8/7: Not able to wean to trach collar 8/8: Only on trach collar a few hours 8/9: Only on trach collar briefly, then had to go back on  mechanical ventilation 8/11: Tolerating ATC 24 hours, Ophtho reconsulted, placed tarsorrhaphy    Significant studies: 3/8 CT head on admission: 4.1 cm hemorrhage centered on pons with intraventricular extension into the 4th ventricle and basal cisterns and small [facility] 3/9 CTA head and neck: no change in IPH and [facility] 3/17 CT head: interval decrease in IPH 3/29 CT head: evolving unremarkably 5/10 CT abdomen: Foley catheter through gastrostomy tract 5/15 US  abdomen: Unremarkable 7/19 CT head: Encephalomalacia at site of prior hemorrhage   Significant microbiology data: 4/1 trach aspirate: MRSA 4/8 trach aspirate: MRSA 5/13 foley catheter urine: E coli 5/13 blood cx x2: no growth 5/14 trach aspirate: MRSA 6/1 blood cx x2: no growth 6/2 trach aspirate: MRSA 6/27 sputum: MRSA, klebsiella 7/7 trach aspirate: MRSA, candida 8/10 Urine cx: PsA  8/10 Blood culture x2: NGTD 8/10 trach aspirate: E coli and PsA  Subjective / 24h Interval events: Remains unresponsive Assesement and Plan: Principal Problem:   ICH (intracerebral hemorrhage) (HCC) Active Problems:   Advanced care planning/counseling discussion   Poor prognosis   Tracheostomy status (HCC)   Pressure injury of skin   Goals of care, counseling/discussion   Chronic respiratory failure with hypoxia (HCC)   Acute respiratory failure (HCC)   Chronic hypoxic respiratory failure (HCC)   Pontine hemorrhage (HCC)   Principal problem 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 Tobie -Now able to tolerate trach collar for the last few days.  Pulmonary following intermittently for trach management.  Appreciate follow-up,  respiratory status has remained stable   Recurrent fever 8/11, suspect pseudomonal/E. coli tracheitis - New fevers in the last week, cultures from blood, urine, tracheal aspirate showed colonization with Pseudomonas, also E. coli on trachea.  Discussed with pulmonology, they recommended empiric treatment, probably for tracheitis, possible CXR-negative pneumonia. -Has been placed on cefepime , today is day/14.  Remains afebrile, white count normal  NSVT - ~40 beats on 8/14, monitor magnesium and potassium, most recently normal. 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  Ophtho Dr. Waylan consulted on 8/11 and placed tarsorraphy on 8/11. - q2 moxifloxacin and moisturizing ointment -Ophthalmology will follow-up today, appreciate input   Hypertension -continue medications as below, blood pressures are acceptable   Normocytic anemia - Stable without bleeding, hemoglobin 10.6 today   Class III obesity - BMI 43, complicates care  Scheduled Meds:  amLODipine   5 mg Per Tube Daily   artificial tears   Right Eye Q2H   Chlorhexidine  Gluconate Cloth  6 each Topical Daily   docusate  100 mg Per Tube BID   enoxaparin  (LOVENOX ) injection  40 mg Subcutaneous Daily   famotidine   20 mg Per Tube BID   feeding supplement (JEVITY 1.5 CAL/FIBER)  237 mL Per Tube 5 X Daily   feeding supplement (PROSource TF20)  60 mL Per Tube TID   free water   200 mL Per Tube BID   gatifloxacin   1 drop Left Eye Q2H   metoprolol  tartrate  75 mg Per Tube BID   multivitamin with minerals  1 tablet Per Tube Daily   mouth rinse  15 mL Mouth Rinse 4 times per day   polyethylene glycol  17 g Per Tube BID   sodium chloride  flush  10-40 mL Intracatheter Q12H   Continuous Infusions:  ceFEPime  (MAXIPIME ) IV 2 g (04/07/24 0604)   PRN Meds:.acetaminophen  **OR** acetaminophen  (TYLENOL ) oral liquid  160 mg/5 mL **OR** acetaminophen , bisacodyl , Gerhardt's butt cream, hydrALAZINE , ipratropium-albuterol , mouth rinse, sodium chloride  flush, traZODone   Current Outpatient Medications  Medication Instructions   acetaminophen  (TYLENOL ) 1,000 mg, Oral, Every 6 hours PRN   ibuprofen (ADVIL) 600 mg, Oral, Every 6 hours PRN    Diet Orders (From admission, onward)     Start     Ordered   03/04/24 2359  Diet NPO time specified  Diet effective midnight        03/03/24 1402            DVT prophylaxis: enoxaparin  (LOVENOX ) injection 40 mg Start: 03/06/24 1000 SCDs Start: 10/27/23 2226   Lab Results  Component Value Date   PLT 653 (H) 04/07/2024      Code Status: Full Code  Family Communication: No family at bedside  Status is: Inpatient Remains inpatient appropriate because: Severity of illness  Level of care: Progressive  Consultants:  PCCM ENT Ophthalmology  Objective: Vitals:   04/07/24 0324 04/07/24 0325 04/07/24 0738 04/07/24 0856  BP: (!) 133/93  130/84   Pulse: 81  84 100  Resp: 18  18 20   Temp: 98.6 F (37 C)  98.3 F (36.8 C)   TempSrc: Axillary  Axillary   SpO2: 100%  96% 100%  Weight:  130 kg    Height:        Intake/Output Summary (Last 24 hours) at 04/07/2024 1009 Last data filed at 04/07/2024 0500 Gross per 24 hour  Intake 1300 ml  Output 1300 ml  Net 0 ml   Wt Readings from Last 3 Encounters:  04/07/24 130 kg    Examination: Constitutional: unresponsive Eyes: lids and conjunctivae normal, no scleral icterus ENMT: mmm Neck: normal, supple Respiratory: clear to auscultation bilaterally, no wheezing, no crackles.  Cardiovascular: Regular rate and rhythm, no murmurs / rubs / gallops.  Abdomen: soft, no distention, no tenderness. Bowel sounds positive.   Data Reviewed: I have independently reviewed following labs and imaging studies   CBC Recent Labs  Lab 03/31/24 1744 04/02/24 0436 04/03/24 0344 04/06/24 0523 04/07/24 0329  WBC 8.1  8.5 8.9 11.1* 10.5  HGB 10.5* 10.6* 10.9* 10.6* 10.6*  HCT 34.8* 35.7* 37.0* 35.8* 35.9*  PLT 556* 529* 588* 592* 653*  MCV 89.5 90.6 91.4 91.6 92.8  MCH 27.0 26.9 26.9 27.1 27.4  MCHC 30.2 29.7* 29.5* 29.6* 29.5*  RDW 15.3 15.2 15.0 15.1 15.4    Recent Labs  Lab 03/31/24 1744 04/02/24 0436 04/03/24 0344 04/04/24 0837 04/06/24 0523 04/07/24 0329  NA 137 140 138  --  140 142  K 3.9 4.5 4.0 4.1 4.1 4.3  CL 101 102 99  --  97* 100  CO2 26 30 31   --  33* 34*  GLUCOSE 125* 99 99  --  118* 122*  BUN 16 22* 21*  --  18 22*  CREATININE 0.46* 0.37* 0.42*  --  0.48* 0.45*  CALCIUM 8.9 9.5 9.1  --  9.4 9.5  AST  --  44* 41  --  31 31  ALT  --  98* 97*  --  74* 71*  ALKPHOS  --  57 58  --  54 58  BILITOT  --  0.2 0.4  --  0.2 0.3  ALBUMIN   --  2.8* 2.8*  --  2.8* 2.8*  MG  --   --  2.2 2.0 2.3 2.4    ------------------------------------------------------------------------------------------------------------------ No results for input(s): CHOL, HDL, LDLCALC, TRIG, CHOLHDL, LDLDIRECT in the last 72 hours.  Lab Results  Component Value Date   HGBA1C 4.7 (L) 10/27/2023   ------------------------------------------------------------------------------------------------------------------ No results for input(s): TSH, T4TOTAL, T3FREE, THYROIDAB in the last 72 hours.  Invalid input(s): FREET3  Cardiac Enzymes No results for input(s): CKMB, TROPONINI, MYOGLOBIN in the last 168 hours.  Invalid input(s): CK ------------------------------------------------------------------------------------------------------------------    Component Value Date/Time   BNP 210.4 (H) 10/27/2023 1820    CBG: Recent Labs  Lab 04/06/24 0001  GLUCAP 153*    Recent Results (from the past 240 hours)  Culture, Respiratory w Gram Stain     Status: None   Collection Time: 03/30/24 10:42 AM   Specimen: Tracheal Aspirate; Respiratory  Result Value Ref Range Status   Specimen  Description TRACHEAL ASPIRATE  Final   Special Requests NONE  Final   Gram Stain   Final    ABUNDANT WBC PRESENT, PREDOMINANTLY PMN FEW GRAM NEGATIVE DIPLOCOCCI FEW GRAM POSITIVE COCCI RARE GRAM POSITIVE RODS FEW GRAM NEGATIVE RODS Performed at [facility], [address], [place], [name] [address]    Culture   Final    FEW ESCHERICHIA COLI RARE PSEUDOMONAS AERUGINOSA    Report Status 04/02/2024 FINAL  Final   Organism ID, Bacteria ESCHERICHIA COLI  Final   Organism ID, Bacteria PSEUDOMONAS AERUGINOSA  Final      Susceptibility   Escherichia coli - MIC*    AMPICILLIN  >=32 RESISTANT Resistant     CEFAZOLIN  Value in next row Resistant      >=  32 RESISTANTThis is a modified FDA-approved test that has been validated and its performance characteristics determined by the reporting laboratory.  This laboratory is certified under the Clinical Laboratory Improvement Amendments CLIA as qualified to perform high complexity clinical laboratory testing.    CEFEPIME  Value in next row Sensitive      >=32 RESISTANTThis is a modified FDA-approved test that has been validated and its performance characteristics determined by the reporting laboratory.  This laboratory is certified under the Clinical Laboratory Improvement Amendments CLIA as qualified to perform high complexity clinical laboratory testing.    ERTAPENEM Value in next row Sensitive      >=32 RESISTANTThis is a modified FDA-approved test that has been validated and its performance characteristics determined by the reporting laboratory.  This laboratory is certified under the Clinical Laboratory Improvement Amendments CLIA as qualified to perform high complexity clinical laboratory testing.    CEFTRIAXONE  Value in next row Sensitive      >=32 RESISTANTThis is a modified FDA-approved test that has been validated and its performance characteristics determined by the reporting laboratory.  This laboratory is certified under the Clinical  Laboratory Improvement Amendments CLIA as qualified to perform high complexity clinical laboratory testing.    CIPROFLOXACIN  Value in next row Sensitive      >=32 RESISTANTThis is a modified FDA-approved test that has been validated and its performance characteristics determined by the reporting laboratory.  This laboratory is certified under the Clinical Laboratory Improvement Amendments CLIA as qualified to perform high complexity clinical laboratory testing.    GENTAMICIN Value in next row Sensitive      >=32 RESISTANTThis is a modified FDA-approved test that has been validated and its performance characteristics determined by the reporting laboratory.  This laboratory is certified under the Clinical Laboratory Improvement Amendments CLIA as qualified to perform high complexity clinical laboratory testing.    MEROPENEM Value in next row Sensitive      >=32 RESISTANTThis is a modified FDA-approved test that has been validated and its performance characteristics determined by the reporting laboratory.  This laboratory is certified under the Clinical Laboratory Improvement Amendments CLIA as qualified to perform high complexity clinical laboratory testing.    TRIMETH/SULFA Value in next row Sensitive      >=32 RESISTANTThis is a modified FDA-approved test that has been validated and its performance characteristics determined by the reporting laboratory.  This laboratory is certified under the Clinical Laboratory Improvement Amendments CLIA as qualified to perform high complexity clinical laboratory testing.    AMPICILLIN /SULBACTAM Value in next row Resistant      >=32 RESISTANTThis is a modified FDA-approved test that has been validated and its performance characteristics determined by the reporting laboratory.  This laboratory is certified under the Clinical Laboratory Improvement Amendments CLIA as qualified to perform high complexity clinical laboratory testing.    PIP/TAZO Value in next row Resistant  ug/mL     >=128 RESISTANTThis is a modified FDA-approved test that has been validated and its performance characteristics determined by the reporting laboratory.  This laboratory is certified under the Clinical Laboratory Improvement Amendments CLIA as qualified to perform high complexity clinical laboratory testing.    * FEW ESCHERICHIA COLI   Pseudomonas aeruginosa - MIC*    MEROPENEM Value in next row Sensitive      >=128 RESISTANTThis is a modified FDA-approved test that has been validated and its performance characteristics determined by the reporting laboratory.  This laboratory is certified under the Clinical Laboratory Improvement Amendments CLIA as qualified to  perform high complexity clinical laboratory testing.    CIPROFLOXACIN  Value in next row Sensitive      >=128 RESISTANTThis is a modified FDA-approved test that has been validated and its performance characteristics determined by the reporting laboratory.  This laboratory is certified under the Clinical Laboratory Improvement Amendments CLIA as qualified to perform high complexity clinical laboratory testing.    IMIPENEM Value in next row Sensitive      >=128 RESISTANTThis is a modified FDA-approved test that has been validated and its performance characteristics determined by the reporting laboratory.  This laboratory is certified under the Clinical Laboratory Improvement Amendments CLIA as qualified to perform high complexity clinical laboratory testing.    PIP/TAZO Value in next row Sensitive ug/mL     16 SENSITIVEThis is a modified FDA-approved test that has been validated and its performance characteristics determined by the reporting laboratory.  This laboratory is certified under the Clinical Laboratory Improvement Amendments CLIA as qualified to perform high complexity clinical laboratory testing.    CEFEPIME  Value in next row Sensitive      16 SENSITIVEThis is a modified FDA-approved test that has been validated and its  performance characteristics determined by the reporting laboratory.  This laboratory is certified under the Clinical Laboratory Improvement Amendments CLIA as qualified to perform high complexity clinical laboratory testing.    CEFTAZIDIME/AVIBACTAM Value in next row Sensitive ug/mL     16 SENSITIVEThis is a modified FDA-approved test that has been validated and its performance characteristics determined by the reporting laboratory.  This laboratory is certified under the Clinical Laboratory Improvement Amendments CLIA as qualified to perform high complexity clinical laboratory testing.    CEFTOLOZANE/TAZOBACTAM Value in next row Sensitive ug/mL     16 SENSITIVEThis is a modified FDA-approved test that has been validated and its performance characteristics determined by the reporting laboratory.  This laboratory is certified under the Clinical Laboratory Improvement Amendments CLIA as qualified to perform high complexity clinical laboratory testing.    TOBRAMYCIN Value in next row Sensitive      16 SENSITIVEThis is a modified FDA-approved test that has been validated and its performance characteristics determined by the reporting laboratory.  This laboratory is certified under the Clinical Laboratory Improvement Amendments CLIA as qualified to perform high complexity clinical laboratory testing.    CEFTAZIDIME Value in next row Sensitive      16 SENSITIVEThis is a modified FDA-approved test that has been validated and its performance characteristics determined by the reporting laboratory.  This laboratory is certified under the Clinical Laboratory Improvement Amendments CLIA as qualified to perform high complexity clinical laboratory testing.    * RARE PSEUDOMONAS AERUGINOSA  Culture, blood (Routine X 2) w Reflex to ID Panel     Status: None   Collection Time: 03/30/24 11:44 AM   Specimen: BLOOD LEFT HAND  Result Value Ref Range Status   Specimen Description BLOOD LEFT HAND  Final   Special Requests    Final    BOTTLES DRAWN AEROBIC ONLY Blood Culture results may not be optimal due to an inadequate volume of blood received in culture bottles   Culture   Final    NO GROWTH 5 DAYS Performed at [facility], [address] [place], [name] [address]    Report Status 04/04/2024 FINAL  Final  Culture, blood (Routine X 2) w Reflex to ID Panel     Status: None   Collection Time: 03/30/24 11:44 AM   Specimen: BLOOD LEFT HAND  Result Value  Ref Range Status   Specimen Description BLOOD LEFT HAND  Final   Special Requests   Final    BOTTLES DRAWN AEROBIC AND ANAEROBIC Blood Culture results may not be optimal due to an inadequate volume of blood received in culture bottles   Culture   Final    NO GROWTH 5 DAYS Performed at [name], [address], [place], [name] [address]    Report Status 04/04/2024 FINAL  Final  Remove and replace urinary cath (placed > 5 days) then obtain urine culture from new indwelling urinary catheter.     Status: Abnormal   Collection Time: 03/30/24  4:51 PM   Specimen: Urine, Catheterized  Result Value Ref Range Status   Specimen Description URINE, CATHETERIZED  Final   Special Requests   Final    NONE Performed at [facility], [address] [place], [name] [address]    Culture 80,000 COLONIES/mL PSEUDOMONAS AERUGINOSA (A)  Final   Report Status 04/01/2024 FINAL  Final   Organism ID, Bacteria PSEUDOMONAS AERUGINOSA (A)  Final      Susceptibility   Pseudomonas aeruginosa - MIC*    MEROPENEM <=0.25 SENSITIVE Sensitive     CIPROFLOXACIN  0.12 SENSITIVE Sensitive     IMIPENEM 1 SENSITIVE Sensitive     PIP/TAZO Value in next row Sensitive ug/mL     <=4 SENSITIVEThis is a modified FDA-approved test that has been validated and its performance characteristics determined by the reporting laboratory.  This laboratory is certified under the Clinical Laboratory Improvement Amendments CLIA as qualified to perform high complexity clinical laboratory  testing.    CEFEPIME  Value in next row Sensitive      <=4 SENSITIVEThis is a modified FDA-approved test that has been validated and its performance characteristics determined by the reporting laboratory.  This laboratory is certified under the Clinical Laboratory Improvement Amendments CLIA as qualified to perform high complexity clinical laboratory testing.    CEFTAZIDIME/AVIBACTAM Value in next row Sensitive ug/mL     <=4 SENSITIVEThis is a modified FDA-approved test that has been validated and its performance characteristics determined by the reporting laboratory.  This laboratory is certified under the Clinical Laboratory Improvement Amendments CLIA as qualified to perform high complexity clinical laboratory testing.    CEFTOLOZANE/TAZOBACTAM Value in next row Sensitive ug/mL     <=4 SENSITIVEThis is a modified FDA-approved test that has been validated and its performance characteristics determined by the reporting laboratory.  This laboratory is certified under the Clinical Laboratory Improvement Amendments CLIA as qualified to perform high complexity clinical laboratory testing.    TOBRAMYCIN Value in next row Sensitive      <=4 SENSITIVEThis is a modified FDA-approved test that has been validated and its performance characteristics determined by the reporting laboratory.  This laboratory is certified under the Clinical Laboratory Improvement Amendments CLIA as qualified to perform high complexity clinical laboratory testing.    CEFTAZIDIME Value in next row Sensitive      <=4 SENSITIVEThis is a modified FDA-approved test that has been validated and its performance characteristics determined by the reporting laboratory.  This laboratory is certified under the Clinical Laboratory Improvement Amendments CLIA as qualified to perform high complexity clinical laboratory testing.    * 80,000 COLONIES/mL PSEUDOMONAS AERUGINOSA    Radiology Studies: No results found.  Nilda Fendt, MD, PhD [facility]  Between 7 am - 7 pm I am available, please contact me via Amion (for emergencies) or Securechat (non urgent messages)  Between  7 pm - 7 am I am not available, please contact night coverage MD/APP via Amion

## 2024-04-07 NOTE — Progress Notes (Signed)
 OPHTHALMOLOGY CONSULT NOTE  Date: 04/07/24 Time: 12:19 PM  Patient Name: Garrett [name]  DOB: 06-10-1976 MRN: [id number]  Reason for Consult: Exposure keratopathy.   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   Prior to Admission medications   Medication Sig Start Date End Date Taking? Authorizing Provider  acetaminophen  (TYLENOL ) 500 MG tablet Take 1,000 mg by mouth every 6 (six) hours as needed for mild pain (pain score 1-3) or headache.   Yes [provider]  ibuprofen (ADVIL) 200 MG tablet Take 600 mg by mouth every 6 (six) hours as needed for mild pain (pain score 1-3).   Yes [provider]    History reviewed. No pertinent past medical history.  family history is not on file.  Social History   Occupational History   Not on file  Tobacco Use   Smoking status: Every Day    Current packs/day: 0.50    Average packs/day: 0.5 packs/day for 30.6 years (15.3 ttl pk-yrs)    Types: Cigarettes    Start date: 1995   Smokeless tobacco: Never  Substance and Sexual Activity   Alcohol  use: Not Currently   Drug use: Never   Sexual activity: Not on file    Allergies  Allergen Reactions   Tomato Hives, Itching and Rash    ROS: Positive as above, otherwise negative.  EXAM:  Mental Status: Minimally reactive  Base Exam: Right Eye Left Eye  Visual Acuity (At near) Unable Unable  IOP (Tonopen) 16 14  Pupillary Exam Min rxn Poor view  Motility roving roving  Confrontation VF Unable Unable   Anterior Segment Exam    Lids/Lashes WNL, Eye open not blinking.  Tarsorraphy in place without signs of inflammation or cheese wring.   Conjuctiva Trace injection 1 + injection, vastly improved from prior  Cornea Clear, few pee Scaring with central stable elevation without clear epi defect  Anterior Chamber Deep and Quiet Deep   Iris Round, Reactive Round, min rxn  Lens Clear Unable   Vitreous WNL Unable     Radiographic Studies Reviewed: None No results found.  Assessment and Recommendation: Exposure keratopathy: Tarsorrhaphy looks good clinical condition improving. Still with significant scarring. Will need to remove tarsorrhaphy in 1 week. Continue all drop and ointment orders till then, Call if issue with tarsorrhaphy.   !!!!!!!!!!!!!!!!!!!!!!!!!Primary goal is to avoid similar situation in the right eye. Must continue ointment q2 hours. Must keep lid taped shut. NEVER PLACE GAUZE OVER EYE!!!!!!!!!!!!!!!!!!!!!!!!!!   Please call with any questions.  Adine Haddock MD [facility] Ophthalmology [phone]

## 2024-04-07 NOTE — Plan of Care (Signed)
  Problem: Nutrition: Goal: Dietary intake will improve Outcome: Progressing   Problem: Intracerebral Hemorrhage Tissue Perfusion: Goal: Complications of Intracerebral Hemorrhage will be minimized Outcome: Not Progressing   Problem: Nutrition: Goal: Risk of aspiration will decrease Outcome: Not Progressing   Problem: Clinical Measurements: Goal: Respiratory complications will improve Outcome: Not Progressing   Problem: Activity: Goal: Risk for activity intolerance will decrease Outcome: Not Progressing   Problem: Education: Goal: Knowledge of disease or condition will improve Outcome: Not Progressing   Problem: Intracerebral Hemorrhage Tissue Perfusion: Goal: Complications of Intracerebral Hemorrhage will be minimized Outcome: Not Progressing   Problem: Self-Care: Goal: Ability to participate in self-care as condition permits will improve Outcome: Not Progressing Goal: Verbalization of feelings and concerns over difficulty with self-care will improve Outcome: Not Progressing Goal: Ability to communicate needs accurately will improve Outcome: Not Progressing   Problem: Nutrition: Goal: Risk of aspiration will decrease Outcome: Not Progressing   Problem: Education: Goal: Knowledge of disease or condition will improve Outcome: Not Progressing Goal: Knowledge of secondary prevention will improve (MUST DOCUMENT ALL) Outcome: Not Progressing   Problem: Intracerebral Hemorrhage Tissue Perfusion: Goal: Complications of Intracerebral Hemorrhage will be minimized Outcome: Not Progressing   Problem: Coping: Goal: Will verbalize positive feelings about self Outcome: Not Progressing   Problem: Health Behavior/Discharge Planning: Goal: Ability to manage health-related needs will improve Outcome: Not Progressing   Problem: Self-Care: Goal: Ability to participate in self-care as condition permits will improve Outcome: Not Progressing Goal: Verbalization of feelings and  concerns over difficulty with self-care will improve Outcome: Not Progressing Goal: Ability to communicate needs accurately will improve Outcome: Not Progressing

## 2024-04-08 DIAGNOSIS — J9611 Chronic respiratory failure with hypoxia: Secondary | ICD-10-CM | POA: Diagnosis not present

## 2024-04-08 DIAGNOSIS — I619 Nontraumatic intracerebral hemorrhage, unspecified: Secondary | ICD-10-CM | POA: Diagnosis not present

## 2024-04-08 DIAGNOSIS — Z930 Tracheostomy status: Secondary | ICD-10-CM | POA: Diagnosis not present

## 2024-04-08 MED ORDER — SODIUM CHLORIDE 0.9 % IV SOLN: INTRAVENOUS | Status: AC | PRN

## 2024-04-08 NOTE — Plan of Care (Signed)
  Problem: Intracerebral Hemorrhage Tissue Perfusion: Goal: Complications of Intracerebral Hemorrhage will be minimized 04/08/2024 1658 by Kemari Narez, Charleen PARAS, RN Outcome: Not Progressing 04/08/2024 1657 by Linton Charleen PARAS, RN Outcome: Not Progressing   Problem: Nutrition: Goal: Risk of aspiration will decrease 04/08/2024 1658 by Sophie Tamez, Charleen PARAS, RN Outcome: Not Progressing 04/08/2024 1657 by Linton Charleen PARAS, RN Outcome: Not Progressing   Problem: Intracerebral Hemorrhage Tissue Perfusion: Goal: Complications of Intracerebral Hemorrhage will be minimized Outcome: Not Progressing   Problem: Self-Care: Goal: Ability to participate in self-care as condition permits will improve Outcome: Not Progressing

## 2024-04-08 NOTE — Progress Notes (Addendum)
 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  He has had a prolonged hospitalization, admitted in March 2025.  Hospital course complicated by persistent vegetative state, recurrent pneumonias, now has a tracheostomy and a PEG tube  Significant events: 3/8: Intubated in ED, pontine bleed/SAH. CT head Acute large hemorrhage 4.1 cm in pons with intraventricular extension without hydrocephalus. 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  Restarted antibiotics.  Urine culture + E. Coli, foley catheter changed 5/18: Completed antibiotics for UTI 5/22: Transferred [facility] 6/11: De-cannulated 01/29/2024 6/27: Placed on 3 L oxygen overnight, tachypnea, increased secretion. Started IV Unasyn . CCM re-consulted.  7/7: Transferred back to ICU; Re-intubated 7/8: ENT consulted for trach 7/16: Trach replaced by ENT 7/17-8/5: Slow vent weaning with low success 8/6: Transferred back to hospitalist service still on vent 8/7: Not able to wean to trach collar 8/8: Only on trach collar a few hours 8/9: Only on trach collar briefly, then had to go back on  mechanical ventilation 8/11: Tolerating ATC 24 hours, Ophtho reconsulted, placed tarsorrhaphy    Significant studies: 3/8 CT head on admission: 4.1 cm hemorrhage centered on pons with intraventricular extension into the 4th ventricle and basal cisterns and small [facility] 3/9 CTA head and neck: no change in IPH and [facility] 3/17 CT head: interval decrease in IPH 3/29 CT head: evolving unremarkably 5/10 CT abdomen: Foley catheter through gastrostomy tract 5/15 US  abdomen: Unremarkable 7/19 CT head: Encephalomalacia at site of prior hemorrhage   Significant microbiology data: 4/1 trach aspirate: MRSA 4/8 trach aspirate: MRSA 5/13 foley catheter urine: E coli 5/13 blood cx x2: no growth 5/14 trach aspirate: MRSA 6/1 blood cx x2: no growth 6/2 trach aspirate: MRSA 6/27 sputum: MRSA, klebsiella 7/7 trach aspirate: MRSA, candida 8/10 Urine cx: PsA  8/10 Blood culture x2: NGTD 8/10 trach aspirate: E coli and PsA  Subjective / 24h Interval events: Remains unresponsive  Assesement and Plan: Principal Problem:   ICH (intracerebral hemorrhage) (HCC) Active Problems:   Advanced care planning/counseling discussion   Poor prognosis   Tracheostomy status (HCC)   Pressure injury of skin   Goals of care, counseling/discussion   Chronic respiratory failure with hypoxia (HCC)   Acute respiratory failure (HCC)   Chronic hypoxic respiratory failure (HCC)   Pontine hemorrhage (HCC)   Principal problem 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  Palliative signed off.   Active problems Chronic respiratory failure due to persistent vegetative state Recurrent MRSA pneumonia Status post redo tracheostomy 7/16 by  ENT Dr. Tobie - Now able to tolerate  trach collar pulmonary following intermittently for trach management, last seen today.  Appreciate follow-up, respiratory status has remained stable   Recurrent fever 8/11, suspect pseudomonal/E. coli tracheitis - New fevers in the last week, cultures from blood, urine, tracheal aspirate showed colonization with Pseudomonas, also E. coli on trachea.  Discussed with pulmonology, they recommended empiric treatment, probably for tracheitis, possible CXR-negative pneumonia. -Has been placed on cefepime , today is day 7/14.  He remains afebrile  NSVT - ~40 beats on 8/14, monitor magnesium and potassium, most recently normal. 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  Ophtho Dr. Waylan consulted on 8/11 and placed tarsorraphy on 8/11. - q2 moxifloxacin and moisturizing ointment -Ophthalmology following up patient, will need to remove tarsorrhaphy in 1 week.   Hypertension -continue medications as below, blood pressures are acceptable   Normocytic anemia - Stable without bleeding, hemoglobin stable   Class III obesity - BMI 43, complicates care  Scheduled Meds:  amLODipine   5 mg Per Tube Daily   artificial tears   Right Eye Q2H   Chlorhexidine  Gluconate Cloth  6 each Topical Daily   docusate  100 mg Per Tube BID   enoxaparin  (LOVENOX ) injection  40 mg Subcutaneous Daily   famotidine   20 mg Per Tube BID   feeding supplement (JEVITY 1.5 CAL/FIBER)  237 mL Per Tube 5 X Daily   feeding supplement (PROSource TF20)  60 mL Per Tube TID   free water   200 mL Per Tube BID   gatifloxacin   1 drop Left Eye Q2H   metoprolol  tartrate  75 mg Per Tube BID   multivitamin with minerals  1 tablet Per Tube Daily   mouth rinse  15 mL Mouth Rinse 4 times per day   polyethylene glycol  17 g Per Tube BID   sodium chloride  flush  10-40 mL Intracatheter Q12H   Continuous  Infusions:  ceFEPime  (MAXIPIME ) IV 2 g (04/08/24 0622)   PRN Meds:.acetaminophen  **OR** acetaminophen  (TYLENOL ) oral liquid 160 mg/5 mL **OR** acetaminophen , bisacodyl , Gerhardt's butt cream, hydrALAZINE , ipratropium-albuterol , mouth rinse, sodium chloride  flush, traZODone   Current Outpatient Medications  Medication Instructions   acetaminophen  (TYLENOL ) 1,000 mg, Oral, Every 6 hours PRN   ibuprofen (ADVIL) 600 mg, Oral, Every 6 hours PRN    Diet Orders (From admission, onward)     Start     Ordered   03/04/24 2359  Diet NPO time specified  Diet effective midnight        03/03/24 1402            DVT prophylaxis: enoxaparin  (LOVENOX ) injection 40 mg Start: 03/06/24 1000 SCDs Start: 10/27/23 2226   Lab Results  Component Value Date   PLT 653 (H) 04/07/2024      Code Status: Full Code  Family Communication: No family at bedside  Status is: Inpatient Remains inpatient appropriate because: Severity of illness  Level of care: Progressive  Consultants:  PCCM ENT Ophthalmology  Objective: Vitals:   04/08/24 0320 04/08/24 0404 04/08/24 0806 04/08/24 0818  BP:  (!) 151/96  (!) 163/95  Pulse: 96 74 (!) 102 (!) 102  Resp: 18 18 18 18   Temp:  98.9 F (37.2 C)  (!) 97.4 F (36.3 C)  TempSrc:  Axillary  Axillary  SpO2: 95% 94% 95% 98%  Weight:  Height:        Intake/Output Summary (Last 24 hours) at 04/08/2024 1012 Last data filed at 04/08/2024 9376 Gross per 24 hour  Intake 400 ml  Output 1300 ml  Net -900 ml   Wt Readings from Last 3 Encounters:  04/07/24 130 kg    Examination: Constitutional: NAD, unresponsive Eyes: lids and conjunctivae normal, no scleral icterus ENMT: mmm Neck: normal, supple Respiratory: clear to auscultation bilaterally, no wheezing, no crackles.  Cardiovascular: Regular rate and rhythm, no murmurs / rubs / gallops.  Abdomen: soft, no distention, no tenderness. Bowel sounds positive.    Data Reviewed: I have independently  reviewed following labs and imaging studies   CBC Recent Labs  Lab 04/02/24 0436 04/03/24 0344 04/06/24 0523 04/07/24 0329  WBC 8.5 8.9 11.1* 10.5  HGB 10.6* 10.9* 10.6* 10.6*  HCT 35.7* 37.0* 35.8* 35.9*  PLT 529* 588* 592* 653*  MCV 90.6 91.4 91.6 92.8  MCH 26.9 26.9 27.1 27.4  MCHC 29.7* 29.5* 29.6* 29.5*  RDW 15.2 15.0 15.1 15.4    Recent Labs  Lab 04/02/24 0436 04/03/24 0344 04/04/24 0837 04/06/24 0523 04/07/24 0329  NA 140 138  --  140 142  K 4.5 4.0 4.1 4.1 4.3  CL 102 99  --  97* 100  CO2 30 31  --  33* 34*  GLUCOSE 99 99  --  118* 122*  BUN 22* 21*  --  18 22*  CREATININE 0.37* 0.42*  --  0.48* 0.45*  CALCIUM 9.5 9.1  --  9.4 9.5  AST 44* 41  --  31 31  ALT 98* 97*  --  74* 71*  ALKPHOS 57 58  --  54 58  BILITOT 0.2 0.4  --  0.2 0.3  ALBUMIN  2.8* 2.8*  --  2.8* 2.8*  MG  --  2.2 2.0 2.3 2.4    ------------------------------------------------------------------------------------------------------------------ No results for input(s): CHOL, HDL, LDLCALC, TRIG, CHOLHDL, LDLDIRECT in the last 72 hours.  Lab Results  Component Value Date   HGBA1C 4.7 (L) 10/27/2023   ------------------------------------------------------------------------------------------------------------------ No results for input(s): TSH, T4TOTAL, T3FREE, THYROIDAB in the last 72 hours.  Invalid input(s): FREET3  Cardiac Enzymes No results for input(s): CKMB, TROPONINI, MYOGLOBIN in the last 168 hours.  Invalid input(s): CK ------------------------------------------------------------------------------------------------------------------    Component Value Date/Time   BNP 210.4 (H) 10/27/2023 1820    CBG: Recent Labs  Lab 04/06/24 0001  GLUCAP 153*    Recent Results (from the past 240 hours)  Culture, Respiratory w Gram Stain     Status: None   Collection Time: 03/30/24 10:42 AM   Specimen: Tracheal Aspirate; Respiratory  Result Value Ref  Range Status   Specimen Description TRACHEAL ASPIRATE  Final   Special Requests NONE  Final   Gram Stain   Final    ABUNDANT WBC PRESENT, PREDOMINANTLY PMN FEW GRAM NEGATIVE DIPLOCOCCI FEW GRAM POSITIVE COCCI RARE GRAM POSITIVE RODS FEW GRAM NEGATIVE RODS Performed at [facility], [address], [place], [name] [address]    Culture   Final    FEW ESCHERICHIA COLI RARE PSEUDOMONAS AERUGINOSA    Report Status 04/02/2024 FINAL  Final   Organism ID, Bacteria ESCHERICHIA COLI  Final   Organism ID, Bacteria PSEUDOMONAS AERUGINOSA  Final      Susceptibility   Escherichia coli - MIC*    AMPICILLIN  >=32 RESISTANT Resistant     CEFAZOLIN  Value in next row Resistant      >=32 RESISTANTThis is a  modified FDA-approved test that has been validated and its performance characteristics determined by the reporting laboratory.  This laboratory is certified under the Clinical Laboratory Improvement Amendments CLIA as qualified to perform high complexity clinical laboratory testing.    CEFEPIME  Value in next row Sensitive      >=32 RESISTANTThis is a modified FDA-approved test that has been validated and its performance characteristics determined by the reporting laboratory.  This laboratory is certified under the Clinical Laboratory Improvement Amendments CLIA as qualified to perform high complexity clinical laboratory testing.    ERTAPENEM Value in next row Sensitive      >=32 RESISTANTThis is a modified FDA-approved test that has been validated and its performance characteristics determined by the reporting laboratory.  This laboratory is certified under the Clinical Laboratory Improvement Amendments CLIA as qualified to perform high complexity clinical laboratory testing.    CEFTRIAXONE  Value in next row Sensitive      >=32 RESISTANTThis is a modified FDA-approved test that has been validated and its performance characteristics determined by the reporting laboratory.  This laboratory is certified  under the Clinical Laboratory Improvement Amendments CLIA as qualified to perform high complexity clinical laboratory testing.    CIPROFLOXACIN  Value in next row Sensitive      >=32 RESISTANTThis is a modified FDA-approved test that has been validated and its performance characteristics determined by the reporting laboratory.  This laboratory is certified under the Clinical Laboratory Improvement Amendments CLIA as qualified to perform high complexity clinical laboratory testing.    GENTAMICIN Value in next row Sensitive      >=32 RESISTANTThis is a modified FDA-approved test that has been validated and its performance characteristics determined by the reporting laboratory.  This laboratory is certified under the Clinical Laboratory Improvement Amendments CLIA as qualified to perform high complexity clinical laboratory testing.    MEROPENEM Value in next row Sensitive      >=32 RESISTANTThis is a modified FDA-approved test that has been validated and its performance characteristics determined by the reporting laboratory.  This laboratory is certified under the Clinical Laboratory Improvement Amendments CLIA as qualified to perform high complexity clinical laboratory testing.    TRIMETH/SULFA Value in next row Sensitive      >=32 RESISTANTThis is a modified FDA-approved test that has been validated and its performance characteristics determined by the reporting laboratory.  This laboratory is certified under the Clinical Laboratory Improvement Amendments CLIA as qualified to perform high complexity clinical laboratory testing.    AMPICILLIN /SULBACTAM Value in next row Resistant      >=32 RESISTANTThis is a modified FDA-approved test that has been validated and its performance characteristics determined by the reporting laboratory.  This laboratory is certified under the Clinical Laboratory Improvement Amendments CLIA as qualified to perform high complexity clinical laboratory testing.    PIP/TAZO Value in  next row Resistant ug/mL     >=128 RESISTANTThis is a modified FDA-approved test that has been validated and its performance characteristics determined by the reporting laboratory.  This laboratory is certified under the Clinical Laboratory Improvement Amendments CLIA as qualified to perform high complexity clinical laboratory testing.    * FEW ESCHERICHIA COLI   Pseudomonas aeruginosa - MIC*    MEROPENEM Value in next row Sensitive      >=128 RESISTANTThis is a modified FDA-approved test that has been validated and its performance characteristics determined by the reporting laboratory.  This laboratory is certified under the Clinical Laboratory Improvement Amendments CLIA as qualified to perform high complexity clinical  laboratory testing.    CIPROFLOXACIN  Value in next row Sensitive      >=128 RESISTANTThis is a modified FDA-approved test that has been validated and its performance characteristics determined by the reporting laboratory.  This laboratory is certified under the Clinical Laboratory Improvement Amendments CLIA as qualified to perform high complexity clinical laboratory testing.    IMIPENEM Value in next row Sensitive      >=128 RESISTANTThis is a modified FDA-approved test that has been validated and its performance characteristics determined by the reporting laboratory.  This laboratory is certified under the Clinical Laboratory Improvement Amendments CLIA as qualified to perform high complexity clinical laboratory testing.    PIP/TAZO Value in next row Sensitive ug/mL     16 SENSITIVEThis is a modified FDA-approved test that has been validated and its performance characteristics determined by the reporting laboratory.  This laboratory is certified under the Clinical Laboratory Improvement Amendments CLIA as qualified to perform high complexity clinical laboratory testing.    CEFEPIME  Value in next row Sensitive      16 SENSITIVEThis is a modified FDA-approved test that has been  validated and its performance characteristics determined by the reporting laboratory.  This laboratory is certified under the Clinical Laboratory Improvement Amendments CLIA as qualified to perform high complexity clinical laboratory testing.    CEFTAZIDIME/AVIBACTAM Value in next row Sensitive ug/mL     16 SENSITIVEThis is a modified FDA-approved test that has been validated and its performance characteristics determined by the reporting laboratory.  This laboratory is certified under the Clinical Laboratory Improvement Amendments CLIA as qualified to perform high complexity clinical laboratory testing.    CEFTOLOZANE/TAZOBACTAM Value in next row Sensitive ug/mL     16 SENSITIVEThis is a modified FDA-approved test that has been validated and its performance characteristics determined by the reporting laboratory.  This laboratory is certified under the Clinical Laboratory Improvement Amendments CLIA as qualified to perform high complexity clinical laboratory testing.    TOBRAMYCIN Value in next row Sensitive      16 SENSITIVEThis is a modified FDA-approved test that has been validated and its performance characteristics determined by the reporting laboratory.  This laboratory is certified under the Clinical Laboratory Improvement Amendments CLIA as qualified to perform high complexity clinical laboratory testing.    CEFTAZIDIME Value in next row Sensitive      16 SENSITIVEThis is a modified FDA-approved test that has been validated and its performance characteristics determined by the reporting laboratory.  This laboratory is certified under the Clinical Laboratory Improvement Amendments CLIA as qualified to perform high complexity clinical laboratory testing.    * RARE PSEUDOMONAS AERUGINOSA  Culture, blood (Routine X 2) w Reflex to ID Panel     Status: None   Collection Time: 03/30/24 11:44 AM   Specimen: BLOOD LEFT HAND  Result Value Ref Range Status   Specimen Description BLOOD LEFT HAND  Final    Special Requests   Final    BOTTLES DRAWN AEROBIC ONLY Blood Culture results may not be optimal due to an inadequate volume of blood received in culture bottles   Culture   Final    NO GROWTH 5 DAYS Performed at [name], [address], [place], [name] [address]    Report Status 04/04/2024 FINAL  Final  Culture, blood (Routine X 2) w Reflex to ID Panel     Status: None   Collection Time: 03/30/24 11:44 AM   Specimen: BLOOD LEFT HAND  Result Value Ref Range Status  Specimen Description BLOOD LEFT HAND  Final   Special Requests   Final    BOTTLES DRAWN AEROBIC AND ANAEROBIC Blood Culture results may not be optimal due to an inadequate volume of blood received in culture bottles   Culture   Final    NO GROWTH 5 DAYS Performed at [facility], [address] [place], [name] [address]    Report Status 04/04/2024 FINAL  Final  Remove and replace urinary cath (placed > 5 days) then obtain urine culture from new indwelling urinary catheter.     Status: Abnormal   Collection Time: 03/30/24  4:51 PM   Specimen: Urine, Catheterized  Result Value Ref Range Status   Specimen Description URINE, CATHETERIZED  Final   Special Requests   Final    NONE Performed at [facility], [address] [employer] [place], [name] [address]    Culture 80,000 COLONIES/mL PSEUDOMONAS AERUGINOSA (A)  Final   Report Status 04/01/2024 FINAL  Final   Organism ID, Bacteria PSEUDOMONAS AERUGINOSA (A)  Final      Susceptibility   Pseudomonas aeruginosa - MIC*    MEROPENEM <=0.25 SENSITIVE Sensitive     CIPROFLOXACIN  0.12 SENSITIVE Sensitive     IMIPENEM 1 SENSITIVE Sensitive     PIP/TAZO Value in next row Sensitive ug/mL     <=4 SENSITIVEThis is a modified FDA-approved test that has been validated and its performance characteristics determined by the reporting laboratory.  This laboratory is certified under the Clinical Laboratory Improvement Amendments CLIA as qualified to perform high complexity  clinical laboratory testing.    CEFEPIME  Value in next row Sensitive      <=4 SENSITIVEThis is a modified FDA-approved test that has been validated and its performance characteristics determined by the reporting laboratory.  This laboratory is certified under the Clinical Laboratory Improvement Amendments CLIA as qualified to perform high complexity clinical laboratory testing.    CEFTAZIDIME/AVIBACTAM Value in next row Sensitive ug/mL     <=4 SENSITIVEThis is a modified FDA-approved test that has been validated and its performance characteristics determined by the reporting laboratory.  This laboratory is certified under the Clinical Laboratory Improvement Amendments CLIA as qualified to perform high complexity clinical laboratory testing.    CEFTOLOZANE/TAZOBACTAM Value in next row Sensitive ug/mL     <=4 SENSITIVEThis is a modified FDA-approved test that has been validated and its performance characteristics determined by the reporting laboratory.  This laboratory is certified under the Clinical Laboratory Improvement Amendments CLIA as qualified to perform high complexity clinical laboratory testing.    TOBRAMYCIN Value in next row Sensitive      <=4 SENSITIVEThis is a modified FDA-approved test that has been validated and its performance characteristics determined by the reporting laboratory.  This laboratory is certified under the Clinical Laboratory Improvement Amendments CLIA as qualified to perform high complexity clinical laboratory testing.    CEFTAZIDIME Value in next row Sensitive      <=4 SENSITIVEThis is a modified FDA-approved test that has been validated and its performance characteristics determined by the reporting laboratory.  This laboratory is certified under the Clinical Laboratory Improvement Amendments CLIA as qualified to perform high complexity clinical laboratory testing.    * 80,000 COLONIES/mL PSEUDOMONAS AERUGINOSA    Radiology Studies: No results found.  Nilda Fendt, MD, PhD [facility]  Between 7 am - 7 pm I am available, please contact me via Amion (for emergencies) or Securechat (non urgent messages)  Between 7 pm - 7 am  I am not available, please contact night coverage MD/APP via Amion

## 2024-04-08 NOTE — Progress Notes (Signed)
 NAME:  Garrett [name], MRN:  [id number], DOB:  1976-06-24, LOS: 164 ADMISSION DATE:  10/27/2023, CONSULTATION DATE:  10/27/2023 REFERRING MD: Regalado - TRH, CHIEF COMPLAINT:  Found down   History of Present Illness:  48 y/o man who was found down at home. PMHx significant for HTN.  Patient was down for an unknown amount of time, found by his fiance when she came home from work, reportedly with agonal breathing. Patient had driven her to work the morning of admission.  He has HTN but never checks his BP and does not follow with MD. Patient's fiance states that she can tell when his BP is really high; his eyes get blood shot and he is sweating. No h/o seizures. Besides almost daily marijuana and cigarette smoking, she is not aware of any other drug use. Drug screen positive for opioids and he was given several doses of Narcan . 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 BP in ED 262/156. CXR revealed RUL and perihilar infiltrates suggesting aspiration pneumonia. ED labs also revealed PH 7.169, LA 4.4, CPK 985. Patient was intubated in ED.  PCCM consulted.  Pertinent Medical History:  Hypertension  Significant Hospital Events: Including procedures, antibiotic start and stop dates in addition to other pertinent events   3/08 - Intubated in ED, pontine bleed/SAH. CT head 17:09 Acute large hemorrhage 4.1 cm in pons with intraventricular extension without hydrocephalus 3/09 - CTA 03:14 AM No change in IPH in pons, similar biparietal [facility] 3/14 - Now awake, appears locked in can follow commands with eyes. 3/16 - Purposeful with left upper extremity  3/22 - Tracheostomy placed at bedside, bleeding issues overnight from trach site 3/24 - PEG (Dr. Sebastian) 3/24 - Sputum culture with MRSA 3/27  - Vomited. TF on hold. Abd film c/w ileus. Added reglan , got SSE. Did tolerate PSV all day  3/28 - BMs x2 after SSE day prior. Added back TFs at 1/2 rated. Tolerated PS 3/29 - Tolerated PS  3/31 - Tolerating CPAP PS 15/5, TF on hold due to ileus. 4/01 - Con't to hold tube feeds today, restarting vancomycin  and sending tracheal aspirate as peaks are 37, plat 24, driving 19. Thick secretions. Fever overnight.  4/02 - Peaks improved. Ongoing hiccups. Trickle feeding. Neuro exam unchanged.  4/20 - Trach was changed to cuffless #8 4/28 - Not safe to swallow. Limited communication and he follows simple commands. On TF 5/01 - On TC 28%, with large secretions. Working with speech and he is improving to initiate PMV trials under ST supervision   5/05 - On TC 30%, 7 L/min. Trach #6 cuffless, changed 5/1 to facilitate PMV trials  5/12 - On TC 21%, 6 L/min. Trach #6 cuffless, unable to produce sounds or speak on PMV. No significant resp secretions. On PEG TF 6/10 - Tracheostomy was removed due to failure to well provide adequate airway 02/15/2024 pulmonary critical care consult. 7/07 Patient obtunded and desaturating on NRB; transfer to icu for intubation; updated mother over phone who wants to reverse code status to full code; ent consulted 7/09 Remains intubated on no continuous sedation, right eye open but does not track movement  7/16 Revision ENT trach with [facility] Flap 7/18 Weaned on vent 5 hours  7/19 Tolerated trach collar for the majority of the day but had some increased work of breath  resulting in being placed back on vent support  7/20 No issues overnight, back on ATC this AM 7/21 Did not tolerate ATC. Placed back on PRVC. 7/24 ATC until afternoon and went on vent for WOB reportedly  7/25 ATC for a few hours  7/26 ATC x 6h 7/28 tolerating trach collar-tolerated for most of the day 7/30 on vent overnight and back on trach collar today 8/1 back on vent overnight  8/2 was able to maintain off of  ventilator overnight but by 6 AM today patient was seen with signs of increased work of breathing including tachycardia therefore patient was placed back on ventilator 8/3 remained on ventilator overnight to transition to trach collar this a.m. 8/4 did 18 hour ATC yesterday; back on vent 3am today 8/5 TC for 12-14 hours. Then went back to vent because of tachypnea.  8/6 tolerated ATC at 30% for 24 hours. Back on vent this AM for rest as was a bit tachypneic 8/11 been off vent >24 hours 8/12-off vent for over 48 hours 8/18 completed abx  8/19 order placed to change to cuffless trach  Interim History / Subjective:  Tmax 100.9 Objective:   Blood pressure (!) 163/95, pulse (!) 102, temperature (!) 97.4 F (36.3 C), temperature source Axillary, resp. rate 18, height 5' 8 (1.727 m), weight 130 kg, SpO2 98%.    FiO2 (%):  [28 %-30 %] 30 %   Intake/Output Summary (Last 24 hours) at 04/08/2024 0954 Last data filed at 04/08/2024 9376 Gross per 24 hour  Intake 400 ml  Output 1300 ml  Net -900 ml   Filed Weights   04/01/24 0729 04/06/24 0132 04/07/24 0325  Weight: 128 kg 129 kg 130 kg   Physical Examination: General lying in bed no obvious distress appears chronically critically ill HEENT his right eye has an occlusive dressing placed.  Status post Tarsorraphy of the left eye, this is sewn shut.  He has a sized 6 cuffed tracheostomy tube with thick tracheal secretions currently on 28% aerosol trach collar Pulmonary scattered rhonchi Cardiac regular rate and rhythm Abdomen soft has a PEG Neuro unresponsive   Resolved Problem List:  Post sedation hypotension Left eye conjunctivitis - resolved  Hyperglycemia, resolved Pseudomonas and E. coli tracheitis Assessment and Plan:   Pontine hemorrhage with brainstem compression Chronic respiratory failure Tracheostomy dependence Ineffective airway clearance Persistent vegetative state PEG status Chronic Foley Moderate protein calorie  malnutrition Nonsustained VT Exposure keratopathy, followed by Optho Hypertension Normocytic anemia Obesity   Pulmonary problem list Tracheostomy dependence with chronic respiratory failure following intracerebral hemorrhage complicated by ineffective airway clearance -He is status post tracheostomy redo, initial tracheostomy placed back in May, had been downsized to a size 4, and patient could not maintain airway clearance.  He is now day 33 postop for his revision.  Still has a cuffed tracheostomy -He is not a candidate for decannulation given ineffective airway clearance Plan Continue routine tracheostomy care Can change trach every 30 days   Pseudomonas and E. coli tracheitis Plan Just completed 14 days of antibiotics Monitor clinically   We will see every other week while he is here in the hospital call as needed

## 2024-04-08 NOTE — Plan of Care (Incomplete)
 The patient remains on MC-3W as of time of writing. The patient opens his right eye spontaneously and tracks motion. The patient does not follow commands. Left eye is sutured closed. No movement in any extremities. ATC at 6 L/min and 28% FiO2. #6 uncuffed Shiley; appropriate supplies at bedside. Enteral, bolus feeds via PEG. PIV access to LUE. Chronic foley catheter remains in place. Prevalon boots to BLE. MEWS yellow overnight (see other note).   Edit: 0430 hours: Page sent to Dr. Keturah RE ventilation pattern (shallow, irregular, rapid) and increasing FiO2 requirement; see new orders.  Edit: Spoke with Benton Lesches RE critical CO2. Tentative plan for transfer back to ICU.  Problem: Intracerebral Hemorrhage Tissue Perfusion: Goal: Complications of Intracerebral Hemorrhage will be minimized Outcome: Not Progressing   Problem: Nutrition: Goal: Risk of aspiration will decrease Outcome: Not Progressing Goal: Dietary intake will improve Outcome: Not Progressing   Problem: Fluid Volume: Goal: Ability to maintain a balanced intake and output will improve Outcome: Not Progressing   Problem: Skin Integrity: Goal: Risk for impaired skin integrity will decrease Outcome: Not Progressing   Problem: Tissue Perfusion: Goal: Adequacy of tissue perfusion will improve Outcome: Not Progressing   Problem: Clinical Measurements: Goal: Ability to maintain clinical measurements within normal limits will improve Outcome: Not Progressing Goal: Will remain free from infection Outcome: Not Progressing Goal: Diagnostic test results will improve Outcome: Not Progressing Goal: Respiratory complications will improve Outcome: Not Progressing Goal: Cardiovascular complication will be avoided Outcome: Not Progressing   Problem: Activity: Goal: Risk for activity intolerance will decrease Outcome: Not Progressing   Problem: Nutrition: Goal: Adequate nutrition will be maintained Outcome: Not  Progressing   Problem: Coping: Goal: Level of anxiety will decrease Outcome: Not Progressing   Problem: Elimination: Goal: Will not experience complications related to bowel motility Outcome: Not Progressing Goal: Will not experience complications related to urinary retention Outcome: Not Progressing   Problem: Pain Managment: Goal: General experience of comfort will improve and/or be controlled Outcome: Not Progressing   Problem: Safety: Goal: Ability to remain free from injury will improve Outcome: Not Progressing   Problem: Skin Integrity: Goal: Risk for impaired skin integrity will decrease Outcome: Not Progressing   Problem: Activity: Goal: Ability to tolerate increased activity will improve Outcome: Not Progressing   Problem: Respiratory: Goal: Patent airway maintenance will improve Outcome: Not Progressing   Problem: Education: Goal: Knowledge of disease or condition will improve Outcome: Not Progressing   Problem: Intracerebral Hemorrhage Tissue Perfusion: Goal: Complications of Intracerebral Hemorrhage will be minimized Outcome: Not Progressing   Problem: Self-Care: Goal: Ability to participate in self-care as condition permits will improve Outcome: Not Progressing Goal: Verbalization of feelings and concerns over difficulty with self-care will improve Outcome: Not Progressing Goal: Ability to communicate needs accurately will improve Outcome: Not Progressing   Problem: Nutrition: Goal: Risk of aspiration will decrease Outcome: Not Progressing Goal: Dietary intake will improve Outcome: Not Progressing   Problem: Education: Goal: Knowledge of disease or condition will improve Outcome: Not Progressing Goal: Knowledge of secondary prevention will improve (MUST DOCUMENT ALL) Outcome: Not Progressing Goal: Knowledge of patient specific risk factors will improve (DELETE if not current risk factor) Outcome: Not Progressing   Problem: Intracerebral  Hemorrhage Tissue Perfusion: Goal: Complications of Intracerebral Hemorrhage will be minimized Outcome: Not Progressing   Problem: Coping: Goal: Will verbalize positive feelings about self Outcome: Not Progressing Goal: Will identify appropriate support needs Outcome: Not Progressing   Problem: Health Behavior/Discharge Planning: Goal: Ability to manage health-related  needs will improve Outcome: Not Progressing Goal: Goals will be collaboratively established with patient/family Outcome: Not Progressing   Problem: Self-Care: Goal: Ability to participate in self-care as condition permits will improve Outcome: Not Progressing Goal: Verbalization of feelings and concerns over difficulty with self-care will improve Outcome: Not Progressing Goal: Ability to communicate needs accurately will improve Outcome: Not Progressing   Problem: Nutrition: Goal: Risk of aspiration will decrease Outcome: Not Progressing Goal: Dietary intake will improve Outcome: Not Progressing

## 2024-04-08 NOTE — Plan of Care (Signed)
  Problem: Intracerebral Hemorrhage Tissue Perfusion: Goal: Complications of Intracerebral Hemorrhage will be minimized Outcome: Not Progressing   Problem: Nutrition: Goal: Risk of aspiration will decrease Outcome: Not Progressing

## 2024-04-09 ENCOUNTER — Inpatient Hospital Stay (HOSPITAL_COMMUNITY)

## 2024-04-09 DIAGNOSIS — Z930 Tracheostomy status: Secondary | ICD-10-CM | POA: Diagnosis not present

## 2024-04-09 DIAGNOSIS — J9621 Acute and chronic respiratory failure with hypoxia: Secondary | ICD-10-CM | POA: Diagnosis not present

## 2024-04-09 DIAGNOSIS — E870 Hyperosmolality and hypernatremia: Secondary | ICD-10-CM | POA: Diagnosis not present

## 2024-04-09 LAB — POCT I-STAT 7, (LYTES, BLD GAS, ICA,H+H)
Acid-Base Excess: 17 mmol/L — ABNORMAL HIGH (ref 0.0–2.0)
Bicarbonate: 39.4 mmol/L — ABNORMAL HIGH (ref 20.0–28.0)
Calcium, Ion: 1.21 mmol/L (ref 1.15–1.40)
HCT: 31 % — ABNORMAL LOW (ref 39.0–52.0)
Hemoglobin: 10.5 g/dL — ABNORMAL LOW (ref 13.0–17.0)
O2 Saturation: 100 %
Patient temperature: 100.3
Potassium: 3.8 mmol/L (ref 3.5–5.1)
Sodium: 140 mmol/L (ref 135–145)
TCO2: 41 mmol/L — ABNORMAL HIGH (ref 22–32)
pCO2 arterial: 37.6 mmHg (ref 32–48)
pH, Arterial: 7.631 (ref 7.35–7.45)
pO2, Arterial: 180 mmHg — ABNORMAL HIGH (ref 83–108)

## 2024-04-09 LAB — CBC
HCT: 41.2 % (ref 39.0–52.0)
Hemoglobin: 11.5 g/dL — ABNORMAL LOW (ref 13.0–17.0)
MCH: 26.9 pg (ref 26.0–34.0)
MCHC: 27.9 g/dL — ABNORMAL LOW (ref 30.0–36.0)
MCV: 96.5 fL (ref 80.0–100.0)
Platelets: 630 K/uL — ABNORMAL HIGH (ref 150–400)
RBC: 4.27 MIL/uL (ref 4.22–5.81)
RDW: 15.1 % (ref 11.5–15.5)
WBC: 12.1 K/uL — ABNORMAL HIGH (ref 4.0–10.5)
nRBC: 0 % (ref 0.0–0.2)

## 2024-04-09 LAB — RESP PANEL BY RT-PCR (RSV, FLU A&B, COVID)  RVPGX2
Influenza A by PCR: NEGATIVE
Influenza B by PCR: NEGATIVE
Resp Syncytial Virus by PCR: NEGATIVE
SARS Coronavirus 2 by RT PCR: NEGATIVE

## 2024-04-09 LAB — BASIC METABOLIC PANEL WITH GFR
Anion gap: 9 (ref 5–15)
BUN: 22 mg/dL — ABNORMAL HIGH (ref 6–20)
CO2: 41 mmol/L — ABNORMAL HIGH (ref 22–32)
Calcium: 9.7 mg/dL (ref 8.9–10.3)
Chloride: 96 mmol/L — ABNORMAL LOW (ref 98–111)
Creatinine, Ser: 0.39 mg/dL — ABNORMAL LOW (ref 0.61–1.24)
GFR, Estimated: >60 mL/min (ref >60)
Glucose, Bld: 132 mg/dL — ABNORMAL HIGH (ref 70–99)
Potassium: 4.9 mmol/L (ref 3.5–5.1)
Sodium: 146 mmol/L — ABNORMAL HIGH (ref 135–145)

## 2024-04-09 LAB — BLOOD GAS, ARTERIAL
Acid-Base Excess: 12.7 mmol/L — ABNORMAL HIGH (ref 0.0–2.0)
Bicarbonate: 43.4 mmol/L — ABNORMAL HIGH (ref 20.0–28.0)
Drawn by: 53547
FIO2: 70 %
O2 Saturation: 90 %
Patient temperature: 37.2
pCO2 arterial: 100 mmHg (ref 32–48)
pH, Arterial: 7.25 — ABNORMAL LOW (ref 7.35–7.45)
pO2, Arterial: 58 mmHg — ABNORMAL LOW (ref 83–108)

## 2024-04-09 LAB — BLOOD GAS, VENOUS
Acid-Base Excess: 16 mmol/L — ABNORMAL HIGH (ref 0.0–2.0)
Bicarbonate: 47.4 mmol/L — ABNORMAL HIGH (ref 20.0–28.0)
O2 Saturation: 65.4 %
Patient temperature: 37.2
pCO2, Ven: 109 mmHg (ref 44–60)
pH, Ven: 7.25 (ref 7.25–7.43)
pO2, Ven: 41 mmHg (ref 32–45)

## 2024-04-09 LAB — GLUCOSE, CAPILLARY
Glucose-Capillary: 107 mg/dL — ABNORMAL HIGH (ref 70–99)
Glucose-Capillary: 111 mg/dL — ABNORMAL HIGH (ref 70–99)
Glucose-Capillary: 138 mg/dL — ABNORMAL HIGH (ref 70–99)
Glucose-Capillary: 177 mg/dL — ABNORMAL HIGH (ref 70–99)
Glucose-Capillary: 157 mg/dL — ABNORMAL HIGH (ref 70–99)

## 2024-04-09 LAB — MAGNESIUM: Magnesium: 2.3 mg/dL (ref 1.7–2.4)

## 2024-04-09 LAB — PHOSPHORUS: Phosphorus: 4.4 mg/dL (ref 2.5–4.6)

## 2024-04-09 MED ORDER — LINEZOLID 600 MG/300ML IV SOLN
600.0000 mg | Freq: Two times a day (BID) | INTRAVENOUS | Status: DC
  Administered 2024-04-09: 600 mg via INTRAVENOUS
  Filled 2024-04-09: qty 300

## 2024-04-09 MED ORDER — VANCOMYCIN HCL 1500 MG/300ML IV SOLN: 1500.0000 mg | Freq: Two times a day (BID) | INTRAVENOUS | Status: DC

## 2024-04-09 MED ORDER — FREE WATER
200.0000 mL | Status: DC
  Administered 2024-04-09 – 2024-04-16 (×43): 200 mL

## 2024-04-09 MED ORDER — LACTATED RINGERS IV BOLUS
500.0000 mL | INTRAVENOUS | Status: AC
  Administered 2024-04-09: 500 mL via INTRAVENOUS

## 2024-04-09 MED ORDER — ORAL CARE MOUTH RINSE
15.0000 mL | OROMUCOSAL | Status: DC
  Administered 2024-04-09 – 2024-04-15 (×73): 15 mL via OROMUCOSAL

## 2024-04-09 MED ORDER — IPRATROPIUM-ALBUTEROL 0.5-2.5 (3) MG/3ML IN SOLN
3.0000 mL | Freq: Four times a day (QID) | RESPIRATORY_TRACT | Status: DC
  Administered 2024-04-09 – 2024-04-17 (×29): 3 mL via RESPIRATORY_TRACT
  Filled 2024-04-09 (×28): qty 3

## 2024-04-09 MED ORDER — JEVITY 1.5 CAL/FIBER PO LIQD
237.0000 mL | Freq: Every day | ORAL | Status: DC
  Administered 2024-04-09 – 2024-06-21 (×363): 237 mL
  Filled 2024-04-09 (×390): qty 237

## 2024-04-09 MED ORDER — PROSOURCE TF20 ENFIT COMPATIBL EN LIQD
60.0000 mL | Freq: Three times a day (TID) | ENTERAL | Status: DC
  Administered 2024-04-09 – 2024-06-21 (×218): 60 mL
  Filled 2024-04-09 (×216): qty 60

## 2024-04-09 MED ORDER — ORAL CARE MOUTH RINSE: 15.0000 mL | OROMUCOSAL | Status: DC | PRN

## 2024-04-09 MED ORDER — LINEZOLID 600 MG PO TABS
600.0000 mg | ORAL_TABLET | Freq: Two times a day (BID) | ORAL | Status: DC
  Administered 2024-04-09 – 2024-04-13 (×8): 600 mg
  Filled 2024-04-09 (×9): qty 1

## 2024-04-09 NOTE — Progress Notes (Addendum)
 NAME:  Garrett [name], MRN:  [id number], DOB:  [birth year], LOS: 165 ADMISSION DATE:  10/27/2023, CONSULTATION DATE:  10/27/2023 REFERRING MD: Regalado - TRH, CHIEF COMPLAINT:  Found down   History of Present Illness:  48 y/o man who was found down at home. PMHx significant for HTN.  Patient was down for an unknown amount of time, found by his fiance when she came home from work, reportedly with agonal breathing. Patient had driven her to work the morning of admission.  He has HTN but never checks his BP and does not follow with MD. Patient's fiance states that she can tell when his BP is really high; his eyes get blood shot and he is sweating. No h/o seizures. Besides almost daily marijuana and cigarette smoking, she is not aware of any other drug use. Drug screen positive for opioids and he was given several doses of Narcan . 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 BP in ED 262/156. CXR revealed RUL and perihilar infiltrates suggesting aspiration pneumonia. ED labs also revealed PH 7.169, LA 4.4, CPK 985. Patient was intubated in ED.  PCCM consulted.  Pertinent Medical History:  Hypertension  Significant Hospital Events: Including procedures, antibiotic start and stop dates in addition to other pertinent events   3/08 - Intubated in ED, pontine bleed/SAH. CT head 17:09 Acute large hemorrhage 4.1 cm in pons with intraventricular extension without hydrocephalus 3/09 - CTA 03:14 AM No change in IPH in pons, similar biparietal [facility] 3/14 - Now awake, appears locked in can follow commands with eyes. 3/16 - Purposeful with left upper extremity  3/22 - Tracheostomy placed at bedside, bleeding issues overnight from trach site 3/24 - PEG (Dr. Sebastian) 3/24 - Sputum culture with MRSA 3/27  - Vomited. TF on hold. Abd film c/w ileus. Added reglan , got SSE. Did tolerate PSV all day  3/28 - BMs x2 after SSE day prior. Added back TFs at 1/2 rated. Tolerated PS 3/29 - Tolerated PS  3/31 - Tolerating CPAP PS 15/5, TF on hold due to ileus. 4/01 - Con't to hold tube feeds today, restarting vancomycin  and sending tracheal aspirate as peaks are 37, plat 24, driving 19. Thick secretions. Fever overnight.  4/02 - Peaks improved. Ongoing hiccups. Trickle feeding. Neuro exam unchanged.  4/20 - Trach was changed to cuffless #8 4/28 - Not safe to swallow. Limited communication and he follows simple commands. On TF 5/01 - On TC 28%, with large secretions. Working with speech and he is improving to initiate PMV trials under ST supervision   5/05 - On TC 30%, 7 L/min. Trach #6 cuffless, changed 5/1 to facilitate PMV trials  5/12 - On TC 21%, 6 L/min. Trach #6 cuffless, unable to produce sounds or speak on PMV. No significant resp secretions. On PEG TF 6/10 - Tracheostomy was removed due to failure to well provide adequate airway 02/15/2024 pulmonary critical care consult. 7/07 Patient obtunded and desaturating on NRB; transfer to icu for intubation; updated mother over phone who wants to reverse code status to full code; ent consulted 7/09 Remains intubated on no continuous sedation, right eye open but does not track movement  7/16 Revision ENT trach with [facility] Flap 7/18 Weaned on vent 5 hours  7/19 Tolerated trach collar for the majority of the day but had some increased work of breath  resulting in being placed back on vent support  7/20 No issues overnight, back on ATC this AM 7/21 Did not tolerate ATC. Placed back on PRVC. 7/24 ATC until afternoon and went on vent for WOB reportedly  7/25 ATC for a few hours  7/26 ATC x 6h 7/28 tolerating trach collar-tolerated for most of the day 7/30 on vent overnight and back on trach collar today 8/1 back on vent overnight  8/2 was able to maintain off of  ventilator overnight but by 6 AM today patient was seen with signs of increased work of breathing including tachycardia therefore patient was placed back on ventilator 8/3 remained on ventilator overnight to transition to trach collar this a.m. 8/4 did 18 hour ATC yesterday; back on vent 3am today 8/5 TC for 12-14 hours. Then went back to vent because of tachypnea.  8/6 tolerated ATC at 30% for 24 hours. Back on vent this AM for rest as was a bit tachypneic 8/11 been off vent >24 hours 8/12-off vent for over 48 hours 8/18 completed abx  8/19 order placed to change to cuffless trach  Interim History / Subjective:   Changed to cuffless trach yesterday Overnight he has had increasing hypoxia, tachypnea with increased work of breathing with hypercarbia on VBG PCCM consulted for help with management.  Objective:   Blood pressure (!) 149/100, pulse 75, temperature 99 F (37.2 C), temperature source Axillary, resp. rate (!) 40, height 5' 8 (1.727 m), weight 130 kg, SpO2 95%.    FiO2 (%):  [28 %-40 %] 40 %   Intake/Output Summary (Last 24 hours) at 04/09/2024 0731 Last data filed at 04/09/2024 0000 Gross per 24 hour  Intake 700 ml  Output 805 ml  Net -105 ml   Filed Weights   04/06/24 0132 04/07/24 0325 04/09/24 0500  Weight: 129 kg 130 kg 130 kg   Physical Examination: Gen:      Chronically ill-appearing HEENT:  EOMI, sclera anicteric Neck:     No masses; no thyromegaly, tracheostomy Lungs:    Bilateral rhonchi CV:         Regular rate and rhythm; no murmurs Abd:      + bowel sounds; soft, non-tender; no palpable masses, no distension Ext:    No edema; adequate peripheral perfusion Neuro: Unresponsive  Labs/imaging reviewed Significant for VBG showing pH 7.25, pCO2 100 WBC 12.1, platelets 630  Chest x-ray with progressive retrocardiac opacity.  Resolved Problem List:  Post sedation hypotension Left eye conjunctivitis - resolved  Hyperglycemia, resolved Pseudomonas and E.  coli tracheitis Assessment and Plan:   Pontine hemorrhage with brainstem compression Chronic respiratory failure Tracheostomy dependence Ineffective airway clearance Persistent vegetative state PEG status Chronic Foley Moderate protein calorie malnutrition Nonsustained VT Exposure keratopathy, followed by Optho Hypertension Normocytic anemia Obesity   Pulmonary problem list Tracheostomy dependence with chronic respiratory failure following intracerebral hemorrhage complicated by ineffective airway clearance -He is status post tracheostomy redo, initial tracheostomy placed back in May, had been downsized to a size 4, and patient could not maintain airway clearance.  He is now day 33 postop for his revision.  He is not a candidate for decannulation given ineffective airway clearance Now with worsening respiratory distress, hypercarbia with concern for new HAP Plan Change back to cuffed trach Transferred to ICU to be placed on ventilator Start Vanco, cefepime  for HAP Follow chest x-ray, ABG  Pseudomonas and E. coli tracheitis Plan Finished 14 days of antibiotics in the past.  Resume antibiotics as above Check  tracheal aspirate.  Hypernatremia Increasing free water  via tube   Critical care time:   The patient is critically ill with multiple organ system failure and requires high complexity decision making for assessment and support, frequent evaluation and titration of therapies, advanced monitoring, review of radiographic studies and interpretation of complex data.   Critical Care Time devoted to patient care services, exclusive of separately billable procedures, described in this note is 35 minutes.   Cortez Flippen MD [facility] See Amion for pager  If no response to pager , please call [phone] until 7pm After 7:00 pm call Elink  [phone] 04/09/2024, 8:40 AM

## 2024-04-09 NOTE — Progress Notes (Signed)
 Sputum sample collected and sent to the lab.

## 2024-04-09 NOTE — Progress Notes (Signed)
 Progress Note   Patient: Garrett [name] FMW:[id number] DOB: July 30, 1976 DOA: 10/27/2023     165 DOS: the patient was seen and examined on 04/09/2024   Brief hospital course: 48 y/o male found down at home with h/o HTN, found to have ICH and pneumonia,  now s/p trach/Peg on Trach collar. Asked to see patient by Dr Segars for increased O2 needs and SOB. Cxr showing possible enlargement retrocardiac infiltrate. Currently on being treated for Pseudomonas in the trach secretions and Urine. And E coli trachiitis.  He is on trach collar and O2 requirements have increased from 28% to 40%.  He is more SOB. 3/08 - Intubated in ED, pontine bleed/SAH. CT head 17:09 Acute large hemorrhage 4.1 cm in pons with intraventricular extension without hydrocephalus 3/09 - CTA 03:14 AM No change in IPH in pons, similar biparietal [facility] 3/14 - Now awake, appears locked in can follow commands with eyes. 3/16 - Purposeful with left upper extremity  3/22 - Tracheostomy placed at bedside, bleeding issues overnight from trach site 3/24 - PEG (Dr. Sebastian) 3/24 - Sputum culture with MRSA 3/27 - Vomited. TF on hold. Abd film c/w ileus. Added reglan , got SSE. Did tolerate PSV all day  3/28 - BMs x2 after SSE day prior. Added back TFs at 1/2 rated. Tolerated PS 3/29 - Tolerated PS  3/31 - Tolerating CPAP PS 15/5, TF on hold due to ileus. 4/01 - Con't to hold tube feeds today, restarting vancomycin  and sending tracheal aspirate as peaks are 37, plat 24, driving 19. Thick secretions. Fever overnight.  4/02 - Peaks improved. Ongoing hiccups. Trickle feeding. Neuro exam unchanged.  4/20 - Trach was changed to cuffless #8 4/28 - Not safe to swallow. Limited communication and he follows simple commands. On TF 5/01 - On TC 28%, with large secretions. Working with speech and he is improving to initiate PMV trials under ST supervision   5/05 - On TC 30%, 7 L/min. Trach #6 cuffless, changed 5/1 to facilitate PMV trials  5/12 -  On TC 21%, 6 L/min. Trach #6 cuffless, unable to produce sounds or speak on PMV. No significant resp secretions. On PEG TF 6/10 - Tracheostomy was removed due to failure to well provide adequate airway 02/15/2024 pulmonary critical care consult. 7/07 Patient obtunded and desaturating on NRB; transfer to icu for intubation; updated mother over phone who wants to reverse code status to full code; ent consulted 7/09 Remains intubated on no continuous sedation, right eye open but does not track movement  7/16 Revision ENT trach with [facility] Flap 7/18 Weaned on vent 5 hours  7/19 Tolerated trach collar for the majority of the day but had some increased work of breath resulting in being placed back on vent support  7/20 No issues overnight, back on ATC this AM 7/21 Did not tolerate ATC. Placed back on PRVC. 7/24 ATC until afternoon and went on vent for WOB reportedly  7/25 ATC for a few hours  7/26 ATC x 6h 7/28 tolerating trach collar-tolerated for most of the day 7/30 on vent overnight and back on trach collar today 8/1 back on vent overnight  8/2 was able to maintain off of ventilator overnight but by 6 AM today patient was seen with signs of increased work of breathing including tachycardia therefore patient was placed back on ventilator 8/3 remained on ventilator overnight to transition to trach collar this a.m. 8/4 did 18 hour ATC yesterday; back on vent 3am today 8/5 TC for 12-14 hours. Then went  back to vent because of tachypnea.  8/6 tolerated ATC at 30% for 24 hours. Back on vent this AM for rest as was a bit tachypneic 8/11 been off vent >24 hours 8/12-off vent for over 48 hours 8/18 completed abx  8/19 order placed to change to cuffless trach Assessment and Plan: Hypoxia and tachypnea Worsening retrocardiac infiltrate Possible new or worsening HAP Plan for VBG to assess need for mechanical vent Consider broadening antibiotic coverage IV fluids to improve hypernatremia and water   deficit Continue with other supportive care      Subjective:  Patient's baseline is non-verbal on TC NAD    Physical Exam: Vitals:   04/09/24 0000 04/09/24 0400 04/09/24 0421 04/09/24 0500  BP: (!) 147/99 (!) 149/100    Pulse: 91 75    Resp: 15 (!) 40    Temp: 98.9 F (37.2 C) 99 F (37.2 C)    TempSrc: Axillary Axillary    SpO2: 92% 94% 95% 95%  Weight:      Height:      Supple and trach site clean CTA no rales or wheezes Reg s1s2 no murmurs or gallops Soft obese nt nd bs pos No cyanosis, clubbing or edema Not able to communicate, eyes sewn shut   Data Reviewed:  reviewed    Disposition: Status is: Inpatient      Time spent: 32 minutes  Author: Orlin Fairly, MD 04/09/2024 6:26 AM  For on call review [email or web address].

## 2024-04-09 NOTE — Progress Notes (Signed)
 Nutrition Follow-up  DOCUMENTATION CODES:  Not applicable  INTERVENTION:  Continue bolus tube feeding via PEG: 5 cartons ( ) Jevity 1.5 daily 60ml water  flush before and after each bolus ( total) Provide additional free water  flushes 200ml q4h per CCM ( total) 60ml ProSource TF20 TID Provides 2015 kcal, 136g protein, total free water   NUTRITION DIAGNOSIS:  Inadequate oral intake related to inability to eat as evidenced by NPO status. - remains applicable  GOAL:  Patient will meet greater than or equal to 90% of their needs - goal met via TF  MONITOR:  Labs, Weight trends, TF tolerance, Skin  REASON FOR ASSESSMENT:  Consult Enteral/tube feeding initiation and management  ASSESSMENT:  Pt with PMH of HTN, daily mariajuana, and smoker admitted after being found down at home with pontine hemorrhagic stroke and trace SAH. UDS positive for opioids. Noted aspiration PNA on admission.  3/08 - admitted with [facility], intubated 3/14 - s/p cortrak placement (tip gastric) 3/22 - s/p tracheostomy 3/24 - s/p PEG 4/20 - Cuff changed to cuffless 5/10 - PEG dislodged, TF's stopped  5/11 - PEG replaced, TF's restarted at goal  6/10 - trach removed 7/07- desat, transferred back to 4N ICU, re-intubated  7/16 - s/p trach placement   8/12 - off vent for >48 hours  Trach changed to cuffless yesterday.  Overnight pt with worsening tachypnea and hypoxia. Worsening retrocardiac infiltrate. Possible new or worsening HAP.  Trach changed back to cuffed today.  Tube feeds held to limit chance of aspiration.  Abdomen remains soft and non-distended.   Patient discussed in rounds.  MD amenable to resumption of tube feeds. Re-ordered and discussed with RN.   Hypernatremic today. IV fluids provided and free water  flush increased per MD.   Current weight not likely accurate following unit changes however current trends remain stable.   Medications: colace BID, MVI, miralax  BID,  IV abx  Labs:  Sodium 146 BUN 22 Cr 0.39  NUTRITION - FOCUSED PHYSICAL EXAM: Repeat exam with observed increase in muscle deficits which is to be expected given inactivity leading to atrophy.  Flowsheet Row Most Recent Value  Orbital Region No depletion  Upper Arm Region No depletion  Thoracic and Lumbar Region No depletion  Buccal Region No depletion  Temple Region Mild depletion  Clavicle Bone Region No depletion  Clavicle and Acromion Bone Region No depletion  Scapular Bone Region Unable to assess  Dorsal Hand Moderate depletion  Patellar Region Mild depletion  Anterior Thigh Region Moderate depletion  Posterior Calf Region Unable to assess  Edema (RD Assessment) Mild  [non-pitting BUE, BLE]  Hair Reviewed  Eyes Unable to assess  Mouth Unable to assess  Skin Reviewed  Nails Reviewed    Diet Order:   Diet Order             Diet NPO time specified  Diet effective now                   EDUCATION NEEDS:  No education needs have been identified at this time  Skin:  Skin Assessment: Reviewed RN Assessment Skin Integrity Issues:: Other (Comment) Other: Non pressure wound, left eye  Last BM:  8/3 x2 (type 7 large; type 6 small)  Height:  Ht Readings from Last 1 Encounters:  03/19/24 5' 8 (1.727 m)    Weight:  Wt Readings from Last 1 Encounters:  04/09/24 130 kg    Ideal Body Weight:  70 kg  BMI:  Body mass index  is 43.58 kg/m.  Estimated Nutritional Needs:   Kcal:  2000-2200  Protein:  125-140 grams  Fluid:  >2 L/day  Garrett [name], RDN, LDN Clinical Nutrition See AMiON for contact information.

## 2024-04-09 NOTE — Progress Notes (Signed)
 Critical results given to RT by lab. Patient on 70% ATC.   Patient was transferred to 3M06 on trach collar with rapid response nurse at bedside.

## 2024-04-09 NOTE — Progress Notes (Signed)
 PT Cancellation Note  Patient Details Name: Garrett [name] MRN: [id number] DOB: 07/12/1976   Cancelled Treatment:    Reason Eval/Treat Not Completed: Medical issues which prohibited therapy (Pt transferred to ICU. Acute PT to re-attempt as medically appropriate.)  Madyson Lukach W, PT, DPT Secure Chat Preferred  Rehab Office [phone]  Kate BRAVO Wendolyn 04/09/2024, 1:38 PM

## 2024-04-09 NOTE — Progress Notes (Signed)
 ABG obtained sent down to lab.

## 2024-04-09 NOTE — Plan of Care (Signed)
 Problem: Intracerebral Hemorrhage Tissue Perfusion: Goal: Complications of Intracerebral Hemorrhage will be minimized Outcome: Not Progressing   Problem: Nutrition: Goal: Risk of aspiration will decrease Outcome: Not Progressing Goal: Dietary intake will improve Outcome: Not Progressing   Problem: Fluid Volume: Goal: Ability to maintain a balanced intake and output will improve Outcome: Not Progressing   Problem: Skin Integrity: Goal: Risk for impaired skin integrity will decrease Outcome: Not Progressing   Problem: Tissue Perfusion: Goal: Adequacy of tissue perfusion will improve Outcome: Not Progressing   Problem: Clinical Measurements: Goal: Ability to maintain clinical measurements within normal limits will improve Outcome: Not Progressing Goal: Will remain free from infection Outcome: Not Progressing Goal: Diagnostic test results will improve Outcome: Not Progressing Goal: Respiratory complications will improve Outcome: Not Progressing Goal: Cardiovascular complication will be avoided Outcome: Not Progressing   Problem: Activity: Goal: Risk for activity intolerance will decrease Outcome: Not Progressing   Problem: Nutrition: Goal: Adequate nutrition will be maintained Outcome: Not Progressing   Problem: Coping: Goal: Level of anxiety will decrease Outcome: Not Progressing   Problem: Elimination: Goal: Will not experience complications related to bowel motility Outcome: Not Progressing Goal: Will not experience complications related to urinary retention Outcome: Not Progressing   Problem: Pain Managment: Goal: General experience of comfort will improve and/or be controlled Outcome: Not Progressing   Problem: Safety: Goal: Ability to remain free from injury will improve Outcome: Not Progressing   Problem: Skin Integrity: Goal: Risk for impaired skin integrity will decrease Outcome: Not Progressing   Problem: Activity: Goal: Ability to tolerate  increased activity will improve Outcome: Not Progressing   Problem: Respiratory: Goal: Patent airway maintenance will improve Outcome: Not Progressing   Problem: Activity: Goal: Ability to tolerate increased activity will improve Outcome: Not Progressing   Problem: Respiratory: Goal: Ability to maintain a clear airway and adequate ventilation will improve Outcome: Not Progressing   Problem: Role Relationship: Goal: Method of communication will improve Outcome: Not Progressing   Problem: Education: Goal: Knowledge of disease or condition will improve Outcome: Not Progressing   Problem: Intracerebral Hemorrhage Tissue Perfusion: Goal: Complications of Intracerebral Hemorrhage will be minimized Outcome: Not Progressing   Problem: Self-Care: Goal: Ability to participate in self-care as condition permits will improve Outcome: Not Progressing Goal: Verbalization of feelings and concerns over difficulty with self-care will improve Outcome: Not Progressing Goal: Ability to communicate needs accurately will improve Outcome: Not Progressing   Problem: Nutrition: Goal: Risk of aspiration will decrease Outcome: Not Progressing Goal: Dietary intake will improve Outcome: Not Progressing   Problem: Education: Goal: Knowledge of disease or condition will improve Outcome: Not Progressing Goal: Knowledge of secondary prevention will improve (MUST DOCUMENT ALL) Outcome: Not Progressing Goal: Knowledge of patient specific risk factors will improve (DELETE if not current risk factor) Outcome: Not Progressing   Problem: Intracerebral Hemorrhage Tissue Perfusion: Goal: Complications of Intracerebral Hemorrhage will be minimized Outcome: Not Progressing   Problem: Coping: Goal: Will verbalize positive feelings about self Outcome: Not Progressing Goal: Will identify appropriate support needs Outcome: Not Progressing   Problem: Health Behavior/Discharge Planning: Goal: Ability to  manage health-related needs will improve Outcome: Not Progressing Goal: Goals will be collaboratively established with patient/family Outcome: Not Progressing   Problem: Self-Care: Goal: Ability to participate in self-care as condition permits will improve Outcome: Not Progressing Goal: Verbalization of feelings and concerns over difficulty with self-care will improve Outcome: Not Progressing Goal: Ability to communicate needs accurately will improve Outcome: Not Progressing   Problem: Nutrition: Goal: Risk  of aspiration will decrease Outcome: Not Progressing Goal: Dietary intake will improve Outcome: Not Progressing

## 2024-04-09 NOTE — Progress Notes (Signed)
 Assisted with transport to 3M06.

## 2024-04-09 NOTE — Progress Notes (Addendum)
 Date and time results received: 04/09/24 0655   Test: PCO2   Critical Value: 109  Name of Provider Notified: Dr. Keturah, Dr. Cheryle; E-link  Orders Received? Or Actions Taken?: Actions Taken: MD notified via page

## 2024-04-09 NOTE — Significant Event (Addendum)
 Notified by RN of sudden change in respiratory status. Tachypneic in the 30s, desat on 6L requiring increase to 12L on trach collar. On evaluation shallow respirations, + diffuse wheezing. CXR increased retrocardiac opacity / atelectasis. Being treated for pseudomonal / e coli tracheitis on Cefepime . Also hx of MRSA pneumonia this hospitalization.   A/P  AHRF  Resp distress  -- PCCM consultation given trach dependence  -- Restart Linezolid  600 mg IV q 12 hr to cover for MRSA  -- Repeat tracheal aspirate, Flu/COVID/RSV, consider RVP if negative  -- Schedule duonebs, increase chest PT  -- Temporarily paused his feeds to limit chance of aspiration   Garrett Dawson, MD  [facility]

## 2024-04-09 NOTE — Progress Notes (Signed)
   04/08/24 2000  Assess: MEWS Score  Temp 98 F (36.7 C)  BP (!) 149/105  MAP (mmHg) 119  Pulse Rate 99  ECG Heart Rate 99  Resp 19  Level of Consciousness Alert  SpO2 94 %  O2 Device Tracheostomy Collar  O2 Flow Rate (L/min) 6 L/min  FiO2 (%) 28 %  Assess: MEWS Score  MEWS Temp 0  MEWS Systolic 0  MEWS Pulse 0  MEWS RR 0  MEWS LOC 0  MEWS Score 0  MEWS Score Color Green  Assess: if the MEWS score is Yellow or Red  Were vital signs accurate and taken at a resting state? Yes  Does the patient meet 2 or more of the SIRS criteria? No  Does the patient have a confirmed or suspected source of infection? Yes  MEWS guidelines implemented  Yes, yellow  Treat  MEWS Interventions Considered administering scheduled or prn medications/treatments as ordered  Take Vital Signs  Increase Vital Sign Frequency  Yellow: Q2hr x1, continue Q4hrs until patient remains green for 12hrs  Escalate  MEWS: Escalate Yellow: Discuss with charge nurse and consider notifying provider and/or RRT  Notify: Charge Nurse/RN  Name of Charge Nurse/RN Notified April, RN  Provider Notification  Provider Name/Title Dr. Keturah  Date Provider Notified 04/08/24  Time Provider Notified 2031  Method of Notification  (secure chat)  Notification Reason Other (Comment) (MEWS)  Provider response No new orders  Date of Provider Response 04/08/24  Time of Provider Response 2128  Assess: SIRS CRITERIA  SIRS Temperature  0  SIRS Respirations  0  SIRS Pulse 1  SIRS WBC 0  SIRS Score Sum  1

## 2024-04-09 NOTE — Progress Notes (Signed)
 Pharmacy Antibiotic Note  Garrett [name] is a 48 y.o. male admitted on 10/27/2023 with pneumonia.  Pharmacy has been consulted for Vancomycin  dosing. Note patient received linezolin this AM.   8/20 Vancomycin  1500mg  Q 12 hr with Est AUC: 480 Scr used: 0.8 mg/dL; Vd coeff: 0.5 L/kg  Plan: Vancomycin  1500 q12hr Cefepime  to end 8/25 Monitor cultures, clinical status, renal function, vancomycin  level Narrow abx as able and f/u duration    Height: 5' 8 (172.7 cm) Weight: 130 kg (286 lb 9.6 oz) IBW/kg (Calculated) : 68.4  Temp (24hrs), Avg:98.4 F (36.9 C), Min:97.9 F (36.6 C), Max:99 F (37.2 C)  Recent Labs  Lab 04/03/24 0344 04/06/24 0523 04/07/24 0329 04/09/24 0342  WBC 8.9 11.1* 10.5 12.1*  CREATININE 0.42* 0.48* 0.45* 0.39*    Estimated Creatinine Clearance: 150.2 mL/min (A) (by C-G formula based on SCr of 0.39 mg/dL (L)).    Allergies  Allergen Reactions   Tomato Hives, Itching and Rash    Antimicrobials this admission: Erythromycin  eye gtts 7/23 >> 8/11 Gatifloxicin eye gtts 8/11 >>  Cefepime  8/12 >> 8/25 Linez 8/20   Vanc 8/20 >>  Dose adjustments this admission: N/a  Microbiology results: 7/7 TA: MRSA, abundant C albicans, few C Parapsilosis  8/5 MRSA + 8/10 TA >> pseudomonas-S cefe, E coli- S-Cefe 8/10 Bcx >>ngtd 8/20 RSV/flu/covid neg 8/20 TA   Thank you for allowing pharmacy to be a part of this patient's care.  Jinnie JONETTA Door 04/09/2024 11:04 AM

## 2024-04-10 DIAGNOSIS — J9621 Acute and chronic respiratory failure with hypoxia: Secondary | ICD-10-CM | POA: Diagnosis not present

## 2024-04-10 DIAGNOSIS — E870 Hyperosmolality and hypernatremia: Secondary | ICD-10-CM | POA: Diagnosis not present

## 2024-04-10 DIAGNOSIS — Z930 Tracheostomy status: Secondary | ICD-10-CM | POA: Diagnosis not present

## 2024-04-10 LAB — CBC WITH DIFFERENTIAL/PLATELET
Abs Immature Granulocytes: 0.06 K/uL (ref 0.00–0.07)
Basophils Absolute: 0 K/uL (ref 0.0–0.1)
Basophils Relative: 0 %
Eosinophils Absolute: 0.1 K/uL (ref 0.0–0.5)
Eosinophils Relative: 1 %
HCT: 31.6 % — ABNORMAL LOW (ref 39.0–52.0)
Hemoglobin: 9.7 g/dL — ABNORMAL LOW (ref 13.0–17.0)
Immature Granulocytes: 1 %
Lymphocytes Relative: 23 %
Lymphs Abs: 2.1 K/uL (ref 0.7–4.0)
MCH: 27.2 pg (ref 26.0–34.0)
MCHC: 30.7 g/dL (ref 30.0–36.0)
MCV: 88.5 fL (ref 80.0–100.0)
Monocytes Absolute: 0.7 K/uL (ref 0.1–1.0)
Monocytes Relative: 8 %
Neutro Abs: 6.2 K/uL (ref 1.7–7.7)
Neutrophils Relative %: 67 %
Platelets: 515 K/uL — ABNORMAL HIGH (ref 150–400)
RBC: 3.57 MIL/uL — ABNORMAL LOW (ref 4.22–5.81)
RDW: 15.9 % — ABNORMAL HIGH (ref 11.5–15.5)
WBC: 9.3 K/uL (ref 4.0–10.5)
nRBC: 0 % (ref 0.0–0.2)

## 2024-04-10 LAB — GLUCOSE, CAPILLARY
Glucose-Capillary: 105 mg/dL — ABNORMAL HIGH (ref 70–99)
Glucose-Capillary: 134 mg/dL — ABNORMAL HIGH (ref 70–99)
Glucose-Capillary: 137 mg/dL — ABNORMAL HIGH (ref 70–99)
Glucose-Capillary: 147 mg/dL — ABNORMAL HIGH (ref 70–99)
Glucose-Capillary: 164 mg/dL — ABNORMAL HIGH (ref 70–99)

## 2024-04-10 LAB — BASIC METABOLIC PANEL WITH GFR
Anion gap: 12 (ref 5–15)
BUN: 30 mg/dL — ABNORMAL HIGH (ref 6–20)
CO2: 31 mmol/L (ref 22–32)
Calcium: 9.1 mg/dL (ref 8.9–10.3)
Chloride: 97 mmol/L — ABNORMAL LOW (ref 98–111)
Creatinine, Ser: 0.66 mg/dL (ref 0.61–1.24)
GFR, Estimated: >60 mL/min (ref >60)
Glucose, Bld: 144 mg/dL — ABNORMAL HIGH (ref 70–99)
Potassium: 3.3 mmol/L — ABNORMAL LOW (ref 3.5–5.1)
Sodium: 140 mmol/L (ref 135–145)

## 2024-04-10 MED ORDER — POTASSIUM CHLORIDE 20 MEQ PO PACK
40.0000 meq | PACK | ORAL | Status: AC
  Administered 2024-04-10 (×2): 40 meq
  Filled 2024-04-10 (×2): qty 2

## 2024-04-10 NOTE — Plan of Care (Signed)
 Problem: Intracerebral Hemorrhage Tissue Perfusion: Goal: Complications of Intracerebral Hemorrhage will be minimized Outcome: Not Progressing   Problem: Nutrition: Goal: Risk of aspiration will decrease Outcome: Not Progressing Goal: Dietary intake will improve Outcome: Not Progressing   Problem: Fluid Volume: Goal: Ability to maintain a balanced intake and output will improve Outcome: Not Progressing   Problem: Skin Integrity: Goal: Risk for impaired skin integrity will decrease Outcome: Not Progressing   Problem: Tissue Perfusion: Goal: Adequacy of tissue perfusion will improve Outcome: Not Progressing   Problem: Clinical Measurements: Goal: Ability to maintain clinical measurements within normal limits will improve Outcome: Not Progressing Goal: Will remain free from infection Outcome: Not Progressing Goal: Diagnostic test results will improve Outcome: Not Progressing Goal: Respiratory complications will improve Outcome: Not Progressing Goal: Cardiovascular complication will be avoided Outcome: Not Progressing   Problem: Activity: Goal: Risk for activity intolerance will decrease Outcome: Not Progressing   Problem: Nutrition: Goal: Adequate nutrition will be maintained Outcome: Not Progressing   Problem: Elimination: Goal: Will not experience complications related to bowel motility Outcome: Not Progressing Goal: Will not experience complications related to urinary retention Outcome: Not Progressing   Problem: Pain Managment: Goal: General experience of comfort will improve and/or be controlled Outcome: Not Progressing   Problem: Safety: Goal: Ability to remain free from injury will improve Outcome: Not Progressing   Problem: Skin Integrity: Goal: Risk for impaired skin integrity will decrease Outcome: Not Progressing   Problem: Activity: Goal: Ability to tolerate increased activity will improve Outcome: Not Progressing   Problem:  Respiratory: Goal: Patent airway maintenance will improve Outcome: Not Progressing   Problem: Activity: Goal: Ability to tolerate increased activity will improve Outcome: Not Progressing   Problem: Respiratory: Goal: Ability to maintain a clear airway and adequate ventilation will improve Outcome: Not Progressing   Problem: Role Relationship: Goal: Method of communication will improve Outcome: Not Progressing   Problem: Education: Goal: Knowledge of disease or condition will improve Outcome: Not Progressing   Problem: Intracerebral Hemorrhage Tissue Perfusion: Goal: Complications of Intracerebral Hemorrhage will be minimized Outcome: Not Progressing   Problem: Self-Care: Goal: Ability to participate in self-care as condition permits will improve Outcome: Not Progressing Goal: Verbalization of feelings and concerns over difficulty with self-care will improve Outcome: Not Progressing Goal: Ability to communicate needs accurately will improve Outcome: Not Progressing   Problem: Nutrition: Goal: Risk of aspiration will decrease Outcome: Not Progressing Goal: Dietary intake will improve Outcome: Not Progressing   Problem: Education: Goal: Knowledge of disease or condition will improve Outcome: Not Progressing Goal: Knowledge of secondary prevention will improve (MUST DOCUMENT ALL) Outcome: Not Progressing Goal: Knowledge of patient specific risk factors will improve (DELETE if not current risk factor) Outcome: Not Progressing   Problem: Intracerebral Hemorrhage Tissue Perfusion: Goal: Complications of Intracerebral Hemorrhage will be minimized Outcome: Not Progressing   Problem: Coping: Goal: Will verbalize positive feelings about self Outcome: Not Progressing Goal: Will identify appropriate support needs Outcome: Not Progressing   Problem: Health Behavior/Discharge Planning: Goal: Ability to manage health-related needs will improve Outcome: Not Progressing Goal:  Goals will be collaboratively established with patient/family Outcome: Not Progressing   Problem: Self-Care: Goal: Ability to participate in self-care as condition permits will improve Outcome: Not Progressing Goal: Verbalization of feelings and concerns over difficulty with self-care will improve Outcome: Not Progressing Goal: Ability to communicate needs accurately will improve Outcome: Not Progressing   Problem: Nutrition: Goal: Risk of aspiration will decrease Outcome: Not Progressing Goal: Dietary intake will improve Outcome:  Not Progressing   Problem: Coping: Goal: Level of anxiety will decrease Outcome: Not Applicable

## 2024-04-10 NOTE — Progress Notes (Signed)
 Pharmacy Electrolyte Replacement  Recent Labs:  Recent Labs    04/09/24 0342 04/09/24 1659 04/10/24 0827  K 4.9   < > 3.3*  MG 2.3  --   --   PHOS 4.4  --   --   CREATININE 0.39*  --  0.66   < > = values in this interval not displayed.    Low Critical Values (K </= 2.5, Phos </= 1, Mg </= 1) Present: None  MD Contacted: n/a  Plan: 40 mEq per tube KCL x 2 doses per protocol   Rankin Sams, PharmD, BCPS, BCCCP Clinical Pharmacist

## 2024-04-10 NOTE — Progress Notes (Signed)
 NAME:  Garrett [name], MRN:  [id number], DOB:  [birth year], LOS: 166 ADMISSION DATE:  10/27/2023, CONSULTATION DATE:  10/27/2023 REFERRING MD: Regalado - TRH, CHIEF COMPLAINT:  Found down   History of Present Illness:  47 y/o man who was found down at home. PMHx significant for HTN.  Patient was down for an unknown amount of time, found by his fiance when she came home from work, reportedly with agonal breathing. Patient had driven her to work the morning of admission.  He has HTN but never checks his BP and does not follow with MD. Patient's fiance states that she can tell when his BP is really high; his eyes get blood shot and he is sweating. No h/o seizures. Besides almost daily marijuana and cigarette smoking, she is not aware of any other drug use. Drug screen positive for opioids and he was given several doses of Narcan . 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 BP in ED 262/156. CXR revealed RUL and perihilar infiltrates suggesting aspiration pneumonia. ED labs also revealed PH 7.169, LA 4.4, CPK 985. Patient was intubated in ED.  PCCM consulted.  Pertinent Medical History:  Hypertension  Significant Hospital Events: Including procedures, antibiotic start and stop dates in addition to other pertinent events   3/08 - Intubated in ED, pontine bleed/SAH. CT head 17:09 Acute large hemorrhage 4.1 cm in pons with intraventricular extension without hydrocephalus 3/09 - CTA 03:14 AM No change in IPH in pons, similar biparietal [facility] 3/14 - Now awake, appears locked in can follow commands with eyes. 3/16 - Purposeful with left upper extremity  3/22 - Tracheostomy placed at bedside, bleeding issues overnight from trach site 3/24 - PEG (Dr. Sebastian) 3/24 - Sputum culture with MRSA 3/27  - Vomited. TF on hold. Abd film c/w ileus. Added reglan , got SSE. Did tolerate PSV all day  3/28 - BMs x2 after SSE day prior. Added back TFs at 1/2 rated. Tolerated PS 3/29 - Tolerated PS  3/31 - Tolerating CPAP PS 15/5, TF on hold due to ileus. 4/01 - Con't to hold tube feeds today, restarting vancomycin  and sending tracheal aspirate as peaks are 37, plat 24, driving 19. Thick secretions. Fever overnight.  4/02 - Peaks improved. Ongoing hiccups. Trickle feeding. Neuro exam unchanged.  4/20 - Trach was changed to cuffless #8 4/28 - Not safe to swallow. Limited communication and he follows simple commands. On TF 5/01 - On TC 28%, with large secretions. Working with speech and he is improving to initiate PMV trials under ST supervision   5/05 - On TC 30%, 7 L/min. Trach #6 cuffless, changed 5/1 to facilitate PMV trials  5/12 - On TC 21%, 6 L/min. Trach #6 cuffless, unable to produce sounds or speak on PMV. No significant resp secretions. On PEG TF 6/10 - Tracheostomy was removed due to failure to well provide adequate airway 02/15/2024 pulmonary critical care consult. 7/07 Patient obtunded and desaturating on NRB; transfer to icu for intubation; updated mother over phone who wants to reverse code status to full code; ent consulted 7/09 Remains intubated on no continuous sedation, right eye open but does not track movement  7/16 Revision ENT trach with [facility] Flap 7/18 Weaned on vent 5 hours  7/19 Tolerated trach collar for the majority of the day but had some increased work of breath  resulting in being placed back on vent support  7/20 No issues overnight, back on ATC this AM 7/21 Did not tolerate ATC. Placed back on PRVC. 7/24 ATC until afternoon and went on vent for WOB reportedly  7/25 ATC for a few hours  7/26 ATC x 6h 7/28 tolerating trach collar-tolerated for most of the day 7/30 on vent overnight and back on trach collar today 8/1 back on vent overnight  8/2 was able to maintain off of  ventilator overnight but by 6 AM today patient was seen with signs of increased work of breathing including tachycardia therefore patient was placed back on ventilator 8/3 remained on ventilator overnight to transition to trach collar this a.m. 8/4 did 18 hour ATC yesterday; back on vent 3am today 8/5 TC for 12-14 hours. Then went back to vent because of tachypnea.  8/6 tolerated ATC at 30% for 24 hours. Back on vent this AM for rest as was a bit tachypneic 8/11 been off vent >24 hours. 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  8/12-off vent for over 48 hours 8/18 completed abx  8/19 Changed to cuffless trach 8/20 Increasing tachypnea, hypercarbia.  Trach changed back to #6 cuffed and brought back to ICU to be placed on ventilator.  Antibiotics broadened to linezolid  and cefepime   Interim History / Subjective:   Remains on the ventilator.  Failed PSV weans today morning Continues on broad antibiotic coverage  Objective:   Blood pressure (!) 125/96, pulse 92, temperature 98.6 F (37 C), temperature source Oral, resp. rate 20, height 5' 8 (1.727 m), weight 130.1 kg, SpO2 100%.    Vent Mode: PRVC FiO2 (%):  [40 %-60 %] 40 % Set Rate:  [20 bmp-28 bmp] 20 bmp Vt Set:  [540 mL] 540 mL PEEP:  [5 cmH20] 5 cmH20 Plateau Pressure:  [19 cmH20-21 cmH20] 19 cmH20   Intake/Output Summary (Last 24 hours) at 04/10/2024 0752 Last data filed at 04/10/2024 9256 Gross per 24 hour  Intake 3005.27 ml  Output 760 ml  Net 2245.27 ml   Filed Weights   04/07/24 0325 04/09/24 0500 04/10/24 0451  Weight: 130 kg 130 kg 130.1 kg   Physical Examination: Gen:      Chronically ill-appearing HEENT:  EOMI, sclera anicteric Neck:     No masses; no thyromegaly, tracheostomy Lungs:    Clear to auscultation bilaterally; normal respiratory effort CV:         Regular rate and rhythm; no murmurs Abd:      + bowel sounds; soft, non-tender; no palpable masses, no  distension Ext:    No edema; adequate peripheral perfusion Neuro: Unresponsive  Resolved Problem List:  Post sedation hypotension Left eye conjunctivitis - resolved  Hyperglycemia, resolved Pseudomonas and E. coli tracheitis Assessment and Plan:   Pontine hemorrhage with brainstem compression Chronic respiratory failure Tracheostomy dependence Ineffective airway clearance Persistent vegetative state PEG status Chronic Foley Moderate protein calorie malnutrition Nonsustained VT Exposure keratopathy, followed by Optho Hypertension Normocytic anemia Obesity   Pulmonary problem list Tracheostomy dependence with chronic respiratory failure following intracerebral hemorrhage complicated by ineffective airway clearance -He is status post tracheostomy redo, initial tracheostomy placed back in May, had been downsized to a size 4, and patient could not maintain airway clearance.  He is now day 33 postop for his revision.  He is not a candidate for decannulation given ineffective airway clearance Now with worsening respiratory distress, hypercarbia with concern for new HAP Plan Change  back to cuffed trach Transferred to ICU to be placed on ventilator On linezolid , cefepime  for HAP Follow intermittent chest x-ray  Pseudomonas and E. coli tracheitis Plan Finished 14 days of antibiotics in the past.  Resume antibiotics as above Check tracheal aspirate.  Hypernatremia Free water  via tube.  Follow labs  Exposure keratopathy Ophtho Dr. Waylan consulted on 8/11 and placed tarsorraphy on 8/11.  Ophthalmology will remove tarsorrhaphy in 1 week. Continue moxifloxacin, moisturizing cream to.  Hypertension Continue Lopressor , Norvasc .  Hydralazine  as needed.  Best Practice (right click and Reselect all SmartList Selections daily)   Diet/type: tubefeeds DVT prophylaxis LMWH Pressure ulcer(s): N/A GI prophylaxis: H2B Lines: N/A Foley:  Yes, and it is still needed Code Status:   full code Last date of multidisciplinary goals of care discussion []   Critical care time:   The patient is critically ill with multiple organ system failure and requires high complexity decision making for assessment and support, frequent evaluation and titration of therapies, advanced monitoring, review of radiographic studies and interpretation of complex data.   Critical Care Time devoted to patient care services, exclusive of separately billable procedures, described in this note is 35 minutes.   Khiara Shuping MD [facility] See Amion for pager  If no response to pager , please call [phone] until 7pm After 7:00 pm call Elink  [phone] 04/10/2024, 7:52 AM   .pccm

## 2024-04-10 NOTE — Progress Notes (Signed)
 Physical Therapy Discharge Patient Details Name: Garrett [name] MRN: [id number] DOB: [birth year] Today's Date: 04/10/2024   Pt has returned to ICU 04/09/24 and been placed back on the ventilator. Pt is currently not following commands.  Patient discharged from PT services secondary to medical decline. Please re-order PT when pt becomes appropriate.  PT signing off.  Lavonya Hoerner B. Fleeta Lapidus PT, DPT Acute Rehabilitation Services Please use secure chat or  Call Office [phone]     [name] 04/10/2024, 9:00 AM

## 2024-04-10 NOTE — Progress Notes (Signed)
 Per Daily respiratory wean assessment, RT placed Pt on PS/CPAP 5/5 40%. Pt did not tolerate due to apnea, No respiratory effort noted. Pt immediately placed back on full support and is tolerating well at this time.

## 2024-04-10 NOTE — Plan of Care (Signed)
 Problem: Intracerebral Hemorrhage Tissue Perfusion: Goal: Complications of Intracerebral Hemorrhage will be minimized Outcome: Not Progressing   Problem: Nutrition: Goal: Risk of aspiration will decrease Outcome: Not Progressing Goal: Dietary intake will improve 04/10/2024 1700 by Pastor Harlene SQUIBB, RN Outcome: Not Progressing 04/10/2024 1700 by Pastor Harlene SQUIBB, RN Outcome: Not Progressing   Problem: Fluid Volume: Goal: Ability to maintain a balanced intake and output will improve 04/10/2024 1700 by Pastor Harlene SQUIBB, RN Outcome: Not Progressing 04/10/2024 1700 by Pastor Harlene SQUIBB, RN Outcome: Not Progressing   Problem: Skin Integrity: Goal: Risk for impaired skin integrity will decrease 04/10/2024 1700 by Pastor Harlene SQUIBB, RN Outcome: Not Progressing 04/10/2024 1700 by Pastor Harlene SQUIBB, RN Outcome: Not Progressing   Problem: Tissue Perfusion: Goal: Adequacy of tissue perfusion will improve 04/10/2024 1700 by Pastor Harlene SQUIBB, RN Outcome: Not Progressing 04/10/2024 1700 by Pastor Harlene SQUIBB, RN Outcome: Not Progressing   Problem: Clinical Measurements: Goal: Ability to maintain clinical measurements within normal limits will improve 04/10/2024 1700 by Pastor Harlene SQUIBB, RN Outcome: Not Progressing 04/10/2024 1700 by Pastor Harlene SQUIBB, RN Outcome: Not Progressing Goal: Will remain free from infection 04/10/2024 1700 by Pastor Harlene SQUIBB, RN Outcome: Not Progressing 04/10/2024 1700 by Pastor Harlene SQUIBB, RN Outcome: Not Progressing Goal: Diagnostic test results will improve 04/10/2024 1700 by Pastor Harlene SQUIBB, RN Outcome: Not Progressing 04/10/2024 1700 by Pastor Harlene SQUIBB, RN Outcome: Not Progressing Goal: Respiratory complications will improve 04/10/2024 1700 by Pastor Harlene SQUIBB, RN Outcome: Not Progressing 04/10/2024 1700 by Pastor Harlene SQUIBB, RN Outcome: Not Progressing Goal: Cardiovascular complication will be avoided 04/10/2024 1700 by Pastor Harlene SQUIBB, RN Outcome: Not Progressing 04/10/2024 1700 by Pastor Harlene SQUIBB, RN Outcome: Not Progressing   Problem: Activity: Goal: Risk for activity intolerance will decrease 04/10/2024 1700 by Pastor Harlene SQUIBB, RN Outcome: Not Progressing 04/10/2024 1700 by Pastor Harlene SQUIBB, RN Outcome: Not Progressing   Problem: Nutrition: Goal: Adequate nutrition will be maintained 04/10/2024 1700 by Pastor Harlene SQUIBB, RN Outcome: Not Progressing 04/10/2024 1700 by Pastor Harlene SQUIBB, RN Outcome: Not Progressing   Problem: Elimination: Goal: Will not experience complications related to bowel motility 04/10/2024 1700 by Pastor Harlene SQUIBB, RN Outcome: Not Progressing 04/10/2024 1700 by Pastor Harlene SQUIBB, RN Outcome: Not Progressing Goal: Will not experience complications related to urinary retention 04/10/2024 1700 by Pastor Harlene SQUIBB, RN Outcome: Not Progressing 04/10/2024 1700 by Pastor Harlene SQUIBB, RN Outcome: Not Progressing   Problem: Pain Managment: Goal: General experience of comfort will improve and/or be controlled 04/10/2024 1700 by Pastor Harlene SQUIBB, RN Outcome: Not Progressing 04/10/2024 1700 by Pastor Harlene SQUIBB, RN Outcome: Not Progressing   Problem: Safety: Goal: Ability to remain free from injury will improve 04/10/2024 1700 by Pastor Harlene SQUIBB, RN Outcome: Not Progressing 04/10/2024 1700 by Pastor Harlene SQUIBB, RN Outcome: Not Progressing   Problem: Skin Integrity: Goal: Risk for impaired skin integrity will decrease 04/10/2024 1700 by Pastor Harlene SQUIBB, RN Outcome: Not Progressing 04/10/2024 1700 by Pastor Harlene SQUIBB, RN Outcome: Not Progressing   Problem: Activity: Goal: Ability to tolerate increased activity will improve 04/10/2024 1700 by Pastor Harlene SQUIBB, RN Outcome: Not Progressing 04/10/2024 1700 by Pastor Harlene SQUIBB, RN Outcome: Not Progressing   Problem: Respiratory: Goal: Patent airway maintenance will improve 04/10/2024 1700 by Pastor Harlene SQUIBB, RN Outcome: Not Progressing 04/10/2024 1700 by Pastor Harlene SQUIBB, RN Outcome: Not Progressing   Problem: Activity: Goal: Ability to tolerate increased activity will improve 04/10/2024 1700 by Keagon Glascoe P,  RN Outcome: Not Progressing 04/10/2024 1700 by Pastor Harlene SQUIBB, RN Outcome: Not Progressing   Problem: Respiratory: Goal: Ability to maintain a clear airway and adequate ventilation will improve 04/10/2024 1700 by Pastor Harlene SQUIBB, RN Outcome: Not Progressing 04/10/2024 1700 by Pastor Harlene SQUIBB, RN Outcome: Not Progressing   Problem: Role Relationship: Goal: Method of communication will improve 04/10/2024 1700 by Pastor Harlene SQUIBB, RN Outcome: Not Progressing 04/10/2024 1700 by Pastor Harlene SQUIBB, RN Outcome: Not Progressing   Problem: Education: Goal: Knowledge of disease or condition will improve 04/10/2024 1700 by Pastor Harlene SQUIBB, RN Outcome: Not Progressing 04/10/2024 1700 by Pastor Harlene SQUIBB, RN Outcome: Not Progressing   Problem: Intracerebral Hemorrhage Tissue Perfusion: Goal: Complications of Intracerebral Hemorrhage will be minimized 04/10/2024 1700 by Pastor Harlene SQUIBB, RN Outcome: Not Progressing 04/10/2024 1700 by Pastor Harlene SQUIBB, RN Outcome: Not Progressing   Problem: Self-Care: Goal: Ability to participate in self-care as condition permits will improve 04/10/2024 1700 by Pastor Harlene SQUIBB, RN Outcome: Not Progressing 04/10/2024 1700 by Pastor Harlene SQUIBB, RN Outcome: Not Progressing Goal: Verbalization of feelings and concerns over difficulty with self-care will improve 04/10/2024 1700 by Pastor Harlene SQUIBB, RN Outcome: Not Progressing 04/10/2024 1700 by Pastor Harlene SQUIBB, RN Outcome: Not Progressing Goal: Ability to communicate needs accurately will improve 04/10/2024 1700 by Pastor Harlene SQUIBB, RN Outcome: Not Progressing 04/10/2024 1700 by Pastor Harlene SQUIBB, RN Outcome: Not Progressing   Problem: Nutrition: Goal: Risk  of aspiration will decrease 04/10/2024 1700 by Pastor Harlene SQUIBB, RN Outcome: Not Progressing 04/10/2024 1700 by Pastor Harlene SQUIBB, RN Outcome: Not Progressing Goal: Dietary intake will improve 04/10/2024 1700 by Pastor Harlene SQUIBB, RN Outcome: Not Progressing 04/10/2024 1700 by Pastor Harlene SQUIBB, RN Outcome: Not Progressing   Problem: Education: Goal: Knowledge of disease or condition will improve 04/10/2024 1700 by Pastor Harlene SQUIBB, RN Outcome: Not Progressing 04/10/2024 1700 by Pastor Harlene SQUIBB, RN Outcome: Not Progressing Goal: Knowledge of secondary prevention will improve (MUST DOCUMENT ALL) 04/10/2024 1700 by Pastor Harlene SQUIBB, RN Outcome: Not Progressing 04/10/2024 1700 by Pastor Harlene SQUIBB, RN Outcome: Not Progressing Goal: Knowledge of patient specific risk factors will improve (DELETE if not current risk factor) 04/10/2024 1700 by Pastor Harlene SQUIBB, RN Outcome: Not Progressing 04/10/2024 1700 by Pastor Harlene SQUIBB, RN Outcome: Not Progressing   Problem: Intracerebral Hemorrhage Tissue Perfusion: Goal: Complications of Intracerebral Hemorrhage will be minimized 04/10/2024 1700 by Pastor Harlene SQUIBB, RN Outcome: Not Progressing 04/10/2024 1700 by Pastor Harlene SQUIBB, RN Outcome: Not Progressing   Problem: Coping: Goal: Will verbalize positive feelings about self 04/10/2024 1700 by Pastor Harlene SQUIBB, RN Outcome: Not Progressing 04/10/2024 1700 by Pastor Harlene SQUIBB, RN Outcome: Not Progressing Goal: Will identify appropriate support needs 04/10/2024 1700 by Pastor Harlene SQUIBB, RN Outcome: Not Progressing 04/10/2024 1700 by Pastor Harlene SQUIBB, RN Outcome: Not Progressing   Problem: Health Behavior/Discharge Planning: Goal: Ability to manage health-related needs will improve 04/10/2024 1700 by Pastor Harlene SQUIBB, RN Outcome: Not Progressing 04/10/2024 1700 by Pastor Harlene SQUIBB, RN Outcome: Not Progressing Goal: Goals will be collaboratively established with  patient/family 04/10/2024 1700 by Pastor Harlene SQUIBB, RN Outcome: Not Progressing 04/10/2024 1700 by Pastor Harlene SQUIBB, RN Outcome: Not Progressing   Problem: Self-Care: Goal: Ability to participate in self-care as condition permits will improve 04/10/2024 1700 by Pastor Harlene SQUIBB, RN Outcome: Not Progressing 04/10/2024 1700 by Pastor Harlene SQUIBB, RN Outcome: Not Progressing Goal: Verbalization of feelings and concerns over difficulty with self-care will improve 04/10/2024 1700  by Harlym Gehling P, RN Outcome: Not Progressing 04/10/2024 1700 by Pastor Harlene SQUIBB, RN Outcome: Not Progressing Goal: Ability to communicate needs accurately will improve 04/10/2024 1700 by Pastor Harlene SQUIBB, RN Outcome: Not Progressing 04/10/2024 1700 by Pastor Harlene SQUIBB, RN Outcome: Not Progressing   Problem: Nutrition: Goal: Risk of aspiration will decrease 04/10/2024 1700 by Pastor Harlene SQUIBB, RN Outcome: Not Progressing 04/10/2024 1700 by Pastor Harlene SQUIBB, RN Outcome: Not Progressing Goal: Dietary intake will improve 04/10/2024 1700 by Pastor Harlene SQUIBB, RN Outcome: Not Progressing 04/10/2024 1700 by Pastor Harlene SQUIBB, RN Outcome: Not Progressing

## 2024-04-11 LAB — GLUCOSE, CAPILLARY
Glucose-Capillary: 103 mg/dL — ABNORMAL HIGH (ref 70–99)
Glucose-Capillary: 114 mg/dL — ABNORMAL HIGH (ref 70–99)
Glucose-Capillary: 119 mg/dL — ABNORMAL HIGH (ref 70–99)
Glucose-Capillary: 125 mg/dL — ABNORMAL HIGH (ref 70–99)
Glucose-Capillary: 126 mg/dL — ABNORMAL HIGH (ref 70–99)
Glucose-Capillary: 128 mg/dL — ABNORMAL HIGH (ref 70–99)

## 2024-04-11 LAB — CBC
HCT: 30.8 % — ABNORMAL LOW (ref 39.0–52.0)
Hemoglobin: 9.4 g/dL — ABNORMAL LOW (ref 13.0–17.0)
MCH: 26.9 pg (ref 26.0–34.0)
MCHC: 30.5 g/dL (ref 30.0–36.0)
MCV: 88.3 fL (ref 80.0–100.0)
Platelets: 472 K/uL — ABNORMAL HIGH (ref 150–400)
RBC: 3.49 MIL/uL — ABNORMAL LOW (ref 4.22–5.81)
RDW: 16 % — ABNORMAL HIGH (ref 11.5–15.5)
WBC: 7.9 K/uL (ref 4.0–10.5)
nRBC: 0 % (ref 0.0–0.2)

## 2024-04-11 LAB — BASIC METABOLIC PANEL WITH GFR
Anion gap: 10 (ref 5–15)
BUN: 22 mg/dL — ABNORMAL HIGH (ref 6–20)
CO2: 25 mmol/L (ref 22–32)
Calcium: 9.2 mg/dL (ref 8.9–10.3)
Chloride: 101 mmol/L (ref 98–111)
Creatinine, Ser: 0.54 mg/dL — ABNORMAL LOW (ref 0.61–1.24)
GFR, Estimated: >60 mL/min (ref >60)
Glucose, Bld: 101 mg/dL — ABNORMAL HIGH (ref 70–99)
Potassium: 3.8 mmol/L (ref 3.5–5.1)
Sodium: 136 mmol/L (ref 135–145)

## 2024-04-11 LAB — CULTURE, RESPIRATORY W GRAM STAIN: Culture: NORMAL

## 2024-04-11 MED ORDER — INSULIN ASPART 100 UNIT/ML IJ SOLN
0.0000 [IU] | INTRAMUSCULAR | Status: DC
  Administered 2024-04-11 (×2): 2 [IU] via SUBCUTANEOUS
  Administered 2024-04-12: 3 [IU] via SUBCUTANEOUS
  Administered 2024-04-12: 2 [IU] via SUBCUTANEOUS
  Administered 2024-04-12: 3 [IU] via SUBCUTANEOUS
  Administered 2024-04-13 – 2024-04-19 (×14): 2 [IU] via SUBCUTANEOUS
  Administered 2024-04-20: 3 [IU] via SUBCUTANEOUS
  Administered 2024-04-20 – 2024-04-23 (×11): 2 [IU] via SUBCUTANEOUS
  Administered 2024-04-23: 3 [IU] via SUBCUTANEOUS
  Administered 2024-04-23 – 2024-04-24 (×4): 2 [IU] via SUBCUTANEOUS
  Administered 2024-04-24: 3 [IU] via SUBCUTANEOUS
  Administered 2024-04-25 (×2): 2 [IU] via SUBCUTANEOUS
  Administered 2024-04-25: 3 [IU] via SUBCUTANEOUS
  Administered 2024-04-26 (×2): 2 [IU] via SUBCUTANEOUS
  Administered 2024-04-27: 3 [IU] via SUBCUTANEOUS
  Administered 2024-04-27 – 2024-04-29 (×7): 2 [IU] via SUBCUTANEOUS
  Administered 2024-04-29: 3 [IU] via SUBCUTANEOUS
  Administered 2024-04-29 (×3): 2 [IU] via SUBCUTANEOUS

## 2024-04-11 NOTE — Plan of Care (Signed)
 Problem: Intracerebral Hemorrhage Tissue Perfusion: Goal: Complications of Intracerebral Hemorrhage will be minimized Outcome: Not Progressing   Problem: Nutrition: Goal: Risk of aspiration will decrease Outcome: Not Progressing Goal: Dietary intake will improve Outcome: Not Progressing   Problem: Fluid Volume: Goal: Ability to maintain a balanced intake and output will improve Outcome: Not Progressing   Problem: Skin Integrity: Goal: Risk for impaired skin integrity will decrease Outcome: Not Progressing   Problem: Tissue Perfusion: Goal: Adequacy of tissue perfusion will improve Outcome: Not Progressing   Problem: Clinical Measurements: Goal: Ability to maintain clinical measurements within normal limits will improve Outcome: Not Progressing Goal: Will remain free from infection Outcome: Not Progressing Goal: Diagnostic test results will improve Outcome: Not Progressing Goal: Respiratory complications will improve Outcome: Not Progressing Goal: Cardiovascular complication will be avoided Outcome: Not Progressing   Problem: Activity: Goal: Risk for activity intolerance will decrease Outcome: Not Progressing   Problem: Nutrition: Goal: Adequate nutrition will be maintained Outcome: Not Progressing   Problem: Elimination: Goal: Will not experience complications related to bowel motility Outcome: Not Progressing Goal: Will not experience complications related to urinary retention Outcome: Not Progressing   Problem: Pain Managment: Goal: General experience of comfort will improve and/or be controlled Outcome: Not Progressing   Problem: Safety: Goal: Ability to remain free from injury will improve Outcome: Not Progressing   Problem: Skin Integrity: Goal: Risk for impaired skin integrity will decrease Outcome: Not Progressing   Problem: Activity: Goal: Ability to tolerate increased activity will improve Outcome: Not Progressing   Problem:  Respiratory: Goal: Patent airway maintenance will improve Outcome: Not Progressing   Problem: Activity: Goal: Ability to tolerate increased activity will improve Outcome: Not Progressing   Problem: Respiratory: Goal: Ability to maintain a clear airway and adequate ventilation will improve Outcome: Not Progressing   Problem: Role Relationship: Goal: Method of communication will improve Outcome: Not Progressing   Problem: Education: Goal: Knowledge of disease or condition will improve Outcome: Not Progressing   Problem: Intracerebral Hemorrhage Tissue Perfusion: Goal: Complications of Intracerebral Hemorrhage will be minimized Outcome: Not Progressing   Problem: Self-Care: Goal: Ability to participate in self-care as condition permits will improve Outcome: Not Progressing Goal: Verbalization of feelings and concerns over difficulty with self-care will improve Outcome: Not Progressing Goal: Ability to communicate needs accurately will improve Outcome: Not Progressing   Problem: Nutrition: Goal: Risk of aspiration will decrease Outcome: Not Progressing Goal: Dietary intake will improve Outcome: Not Progressing   Problem: Education: Goal: Knowledge of disease or condition will improve Outcome: Not Progressing Goal: Knowledge of secondary prevention will improve (MUST DOCUMENT ALL) Outcome: Not Progressing Goal: Knowledge of patient specific risk factors will improve (DELETE if not current risk factor) Outcome: Not Progressing   Problem: Intracerebral Hemorrhage Tissue Perfusion: Goal: Complications of Intracerebral Hemorrhage will be minimized Outcome: Not Progressing   Problem: Coping: Goal: Will verbalize positive feelings about self Outcome: Not Progressing Goal: Will identify appropriate support needs Outcome: Not Progressing   Problem: Health Behavior/Discharge Planning: Goal: Ability to manage health-related needs will improve Outcome: Not Progressing Goal:  Goals will be collaboratively established with patient/family Outcome: Not Progressing   Problem: Self-Care: Goal: Ability to participate in self-care as condition permits will improve Outcome: Not Progressing Goal: Verbalization of feelings and concerns over difficulty with self-care will improve Outcome: Not Progressing Goal: Ability to communicate needs accurately will improve Outcome: Not Progressing   Problem: Nutrition: Goal: Risk of aspiration will decrease Outcome: Not Progressing Goal: Dietary intake will improve Outcome:  Not Progressing

## 2024-04-11 NOTE — Progress Notes (Signed)
   04/11/24 0750  Daily Weaning Assessment  Daily Assessment of Readiness to Wean Wean protocol criteria met (SBT performed)  SBT Method CPAP 5 cm H20 and PS 5 cm H20 (PS 10)  Weaning Start Time 0750   Pt was placed on PS/CPAP mode on ventilator. Pt is tolerating well, maintaining Mve of 7.4 at this time.

## 2024-04-11 NOTE — Progress Notes (Signed)
   04/11/24 0842  Adult Ventilator Settings  Vent Mode PRVC  Vt Set 540 mL  Set Rate 20 bmp  FiO2 (%) 40 %  I Time 0.9 Sec(s)  PEEP 5 cmH20   Pt was placed back on full support due to RR > 50s. Pt tolerating well at this time.

## 2024-04-11 NOTE — Progress Notes (Addendum)
 NAME:  Garrett [name], MRN:  [id number], DOB:  02-Mar-1976, LOS: 167 ADMISSION DATE:  10/27/2023, CONSULTATION DATE:  10/27/2023 REFERRING MD: Regalado - TRH, CHIEF COMPLAINT:  Found down   History of Present Illness:  48 y/o man who was found down at home. PMHx significant for HTN.  Patient was down for an unknown amount of time, found by his fiance when she came home from work, reportedly with agonal breathing. Patient had driven her to work the morning of admission.  He has HTN but never checks his BP and does not follow with MD. Patient's fiance states that she can tell when his BP is really high; his eyes get blood shot and he is sweating. No h/o seizures. Besides almost daily marijuana and cigarette smoking, she is not aware of any other drug use. Drug screen positive for opioids and he was given several doses of Narcan . 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 BP in ED 262/156. CXR revealed RUL and perihilar infiltrates suggesting aspiration pneumonia. ED labs also revealed PH 7.169, LA 4.4, CPK 985. Patient was intubated in ED.  PCCM consulted.  Pertinent Medical History:  Hypertension  Significant Hospital Events: Including procedures, antibiotic start and stop dates in addition to other pertinent events   3/08 - Intubated in ED, pontine bleed/SAH. CT head 17:09 Acute large hemorrhage 4.1 cm in pons with intraventricular extension without hydrocephalus 3/09 - CTA 03:14 AM No change in IPH in pons, similar biparietal [facility] 3/14 - Now awake, appears locked in can follow commands with eyes. 3/16 - Purposeful with left upper extremity  3/22 - Tracheostomy placed at bedside, bleeding issues overnight from trach site 3/24 - PEG (Dr. Sebastian) 3/24 - Sputum culture with MRSA 3/27  - Vomited. TF on hold. Abd film c/w ileus. Added reglan , got SSE. Did tolerate PSV all day  3/28 - BMs x2 after SSE day prior. Added back TFs at 1/2 rated. Tolerated PS 3/29 - Tolerated PS  3/31 - Tolerating CPAP PS 15/5, TF on hold due to ileus. 4/01 - Con't to hold tube feeds today, restarting vancomycin  and sending tracheal aspirate as peaks are 37, plat 24, driving 19. Thick secretions. Fever overnight.  4/02 - Peaks improved. Ongoing hiccups. Trickle feeding. Neuro exam unchanged.  4/20 - Trach was changed to cuffless #8 4/28 - Not safe to swallow. Limited communication and he follows simple commands. On TF 5/01 - On TC 28%, with large secretions. Working with speech and he is improving to initiate PMV trials under ST supervision   5/05 - On TC 30%, 7 L/min. Trach #6 cuffless, changed 5/1 to facilitate PMV trials  5/12 - On TC 21%, 6 L/min. Trach #6 cuffless, unable to produce sounds or speak on PMV. No significant resp secretions. On PEG TF 6/10 - Tracheostomy was removed due to failure to well provide adequate airway 02/15/2024 pulmonary critical care consult. 7/07 Patient obtunded and desaturating on NRB; transfer to icu for intubation; updated mother over phone who wants to reverse code status to full code; ent consulted 7/09 Remains intubated on no continuous sedation, right eye open but does not track movement  7/16 Revision ENT trach with [facility] Flap 7/18 Weaned on vent 5 hours  7/19 Tolerated trach collar for the majority of the day but had some increased work of breath  resulting in being placed back on vent support  7/20 No issues overnight, back on ATC this AM 7/21 Did not tolerate ATC. Placed back on PRVC. 7/24 ATC until afternoon and went on vent for WOB reportedly  7/25 ATC for a few hours  7/26 ATC x 6h 7/28 tolerating trach collar-tolerated for most of the day 7/30 on vent overnight and back on trach collar today 8/1 back on vent overnight  8/2 was able to maintain off of  ventilator overnight but by 6 AM today patient was seen with signs of increased work of breathing including tachycardia therefore patient was placed back on ventilator 8/3 remained on ventilator overnight to transition to trach collar this a.m. 8/4 did 18 hour ATC yesterday; back on vent 3am today 8/5 TC for 12-14 hours. Then went back to vent because of tachypnea.  8/6 tolerated ATC at 30% for 24 hours. Back on vent this AM for rest as was a bit tachypneic 8/11 been off vent >24 hours. 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  8/12-off vent for over 48 hours 8/18 completed abx  8/19 Changed to cuffless trach 8/20 Increasing tachypnea, hypercarbia.  Trach changed back to #6 cuffed and brought back to ICU to be placed on ventilator.  Antibiotics broadened to linezolid  and cefepime   Interim History / Subjective:   On PSV weans today morning  Objective:   Blood pressure (!) 127/96, pulse 96, temperature 98.6 F (37 C), temperature source Oral, resp. rate 20, height 5' 8 (1.727 m), weight 130 kg, SpO2 98%.    Vent Mode: PRVC FiO2 (%):  [40 %] 40 % Set Rate:  [20 bmp] 20 bmp Vt Set:  [540 mL] 540 mL PEEP:  [5 cmH20] 5 cmH20 Pressure Support:  [10 cmH20] 10 cmH20 Plateau Pressure:  [19 cmH20] 19 cmH20   Intake/Output Summary (Last 24 hours) at 04/11/2024 0920 Last data filed at 04/11/2024 9193 Gross per 24 hour  Intake 2636 ml  Output 1700 ml  Net 936 ml   Filed Weights   04/09/24 0500 04/10/24 0451 04/11/24 0500  Weight: 130 kg 130.1 kg 130 kg   Physical Examination: Gen:      No acute distress HEENT:  EOMI, sclera anicteric, Trach Neck:     No masses; no thyromegaly Lungs:    Clear to auscultation bilaterally; normal respiratory effort CV:         Regular rate and rhythm; no murmurs Abd:      + bowel sounds; soft, non-tender; no palpable masses, no distension Ext:    No edema; adequate peripheral perfusion Neuro:  Unresponsive  Lab/imaging reviewed Significant for sodium 136 BUN/creatinine 22/0.54 Hemoglobin 9.4, platelets 472  Resolved Problem List:  Post sedation hypotension Left eye conjunctivitis - resolved  Hyperglycemia, resolved Pseudomonas and E. coli tracheitis Hypernatremia  Assessment and Plan:   Pontine hemorrhage with brainstem compression Chronic respiratory failure Tracheostomy dependence Ineffective airway clearance Persistent vegetative state PEG status Chronic Foley Moderate protein calorie malnutrition Nonsustained VT Exposure keratopathy, followed by Optho Hypertension Normocytic anemia Obesity  Pulmonary problem list Tracheostomy dependence with chronic respiratory failure following intracerebral hemorrhage complicated by ineffective airway clearance -He is status post tracheostomy redo, initial tracheostomy placed back in May, had been downsized to a size 4, and patient could not maintain airway clearance.  He is now day 33 postop for his revision.  He is not a candidate for decannulation given ineffective airway clearance Now with worsening respiratory  distress, hypercarbia with concern for new HAP Plan Change back to cuffed trach PSV weans as tolerated On linezolid , cefepime  for HAP Follow intermittent chest x-ray  Pseudomonas and E. coli tracheitis Plan Finished 14 days of antibiotics in the past.  Resume antibiotics as above Check tracheal aspirate.  Exposure keratopathy Ophtho Dr. Waylan consulted on 8/11 and placed tarsorraphy on 8/11.  Ophthalmology will remove tarsorrhaphy in 1 week. Continue moxifloxacin, moisturizing cream to.  Hypertension Continue Lopressor , Norvasc .  Hydralazine  as needed.  Best Practice (right click and Reselect all SmartList Selections daily)   Diet/type: tubefeeds DVT prophylaxis LMWH Pressure ulcer(s): N/A GI prophylaxis: H2B Lines: N/A Foley:  Yes, and it is still needed Code Status:  full code Last date of  multidisciplinary goals of care discussion []   Critical care time:   The patient is critically ill with multiple organ system failure and requires high complexity decision making for assessment and support, frequent evaluation and titration of therapies, advanced monitoring, review of radiographic studies and interpretation of complex data.   Critical Care Time devoted to patient care services, exclusive of separately billable procedures, described in this note is 35 minutes.   Yerachmiel Spinney MD [facility] See Amion for pager  If no response to pager , please call [phone] until 7pm After 7:00 pm call Elink  [phone] 04/11/2024, 9:20 AM   .pccm

## 2024-04-12 ENCOUNTER — Ambulatory Visit (HOSPITAL_COMMUNITY)

## 2024-04-12 DIAGNOSIS — J9622 Acute and chronic respiratory failure with hypercapnia: Secondary | ICD-10-CM | POA: Diagnosis not present

## 2024-04-12 DIAGNOSIS — J189 Pneumonia, unspecified organism: Secondary | ICD-10-CM

## 2024-04-12 DIAGNOSIS — Z930 Tracheostomy status: Secondary | ICD-10-CM | POA: Diagnosis not present

## 2024-04-12 DIAGNOSIS — I613 Nontraumatic intracerebral hemorrhage in brain stem: Secondary | ICD-10-CM | POA: Diagnosis not present

## 2024-04-12 DIAGNOSIS — J9621 Acute and chronic respiratory failure with hypoxia: Secondary | ICD-10-CM | POA: Diagnosis not present

## 2024-04-12 DIAGNOSIS — G935 Compression of brain: Secondary | ICD-10-CM | POA: Diagnosis not present

## 2024-04-12 LAB — GLUCOSE, CAPILLARY
Glucose-Capillary: 93 mg/dL (ref 70–99)
Glucose-Capillary: 146 mg/dL — ABNORMAL HIGH (ref 70–99)
Glucose-Capillary: 144 mg/dL — ABNORMAL HIGH (ref 70–99)
Glucose-Capillary: 87 mg/dL (ref 70–99)
Glucose-Capillary: 178 mg/dL — ABNORMAL HIGH (ref 70–99)
Glucose-Capillary: 100 mg/dL — ABNORMAL HIGH (ref 70–99)

## 2024-04-12 LAB — CBC
HCT: 30.7 % — ABNORMAL LOW (ref 39.0–52.0)
Hemoglobin: 9.3 g/dL — ABNORMAL LOW (ref 13.0–17.0)
MCH: 26.8 pg (ref 26.0–34.0)
MCHC: 30.3 g/dL (ref 30.0–36.0)
MCV: 88.5 fL (ref 80.0–100.0)
Platelets: 442 K/uL — ABNORMAL HIGH (ref 150–400)
RBC: 3.47 MIL/uL — ABNORMAL LOW (ref 4.22–5.81)
RDW: 16 % — ABNORMAL HIGH (ref 11.5–15.5)
WBC: 8.7 K/uL (ref 4.0–10.5)
nRBC: 0.2 % (ref 0.0–0.2)

## 2024-04-12 LAB — PROCALCITONIN: Procalcitonin: <0.1 ng/mL

## 2024-04-12 LAB — BASIC METABOLIC PANEL WITH GFR
Anion gap: 8 (ref 5–15)
BUN: 20 mg/dL (ref 6–20)
CO2: 24 mmol/L (ref 22–32)
Calcium: 9.1 mg/dL (ref 8.9–10.3)
Chloride: 103 mmol/L (ref 98–111)
Creatinine, Ser: 0.49 mg/dL — ABNORMAL LOW (ref 0.61–1.24)
GFR, Estimated: >60 mL/min (ref >60)
Glucose, Bld: 91 mg/dL (ref 70–99)
Potassium: 3.7 mmol/L (ref 3.5–5.1)
Sodium: 135 mmol/L (ref 135–145)

## 2024-04-12 MED ORDER — POLYETHYLENE GLYCOL 3350 17 G PO PACK: 17.0000 g | PACK | Freq: Every day | ORAL | Status: DC | PRN

## 2024-04-12 MED ORDER — POTASSIUM CHLORIDE 20 MEQ PO PACK
40.0000 meq | PACK | Freq: Once | ORAL | Status: AC
  Administered 2024-04-12: 40 meq
  Filled 2024-04-12: qty 2

## 2024-04-12 NOTE — Progress Notes (Signed)
 eLink Physician-Brief Progress Note Patient Name: Garrett [name] DOB: May 01, 1976 MRN: [id number]   Date of Service  04/12/2024  HPI/Events of Note  Patient has visible twitching of the right face and minor twitching of the left upper extremity most noticeable at the corner of the right lip and left fingertips.  Some rhythmic movement to it but no global involvement.  Still directional with his eyes but difficult to assess neurologically  eICU Interventions  Will obtain EEG, suspect more upper motor neuron symptoms but given his status, difficult to rule out concurrent seizure     Intervention Category Minor Interventions: Clinical assessment - ordering diagnostic tests  Brynnlee Cumpian 04/12/2024, 8:06 PM

## 2024-04-12 NOTE — Progress Notes (Signed)
 NAME:  Garrett [name], MRN:  [id number], DOB:  07-04-1976, LOS: 168 ADMISSION DATE:  10/27/2023, CONSULTATION DATE:  10/27/2023 REFERRING MD: Regalado - TRH, CHIEF COMPLAINT:  Found down   History of Present Illness:  48 y/o man who was found down at home. PMHx significant for HTN.  Patient was down for an unknown amount of time, found by his fiance when she came home from work, reportedly with agonal breathing. Patient had driven her to work the morning of admission.  He has HTN but never checks his BP and does not follow with MD. Patient's fiance states that she can tell when his BP is really high; his eyes get blood shot and he is sweating. No h/o seizures. Besides almost daily marijuana and cigarette smoking, she is not aware of any other drug use. Drug screen positive for opioids and he was given several doses of Narcan . 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 BP in ED 262/156. CXR revealed RUL and perihilar infiltrates suggesting aspiration pneumonia. ED labs also revealed PH 7.169, LA 4.4, CPK 985. Patient was intubated in ED.  PCCM consulted.  Pertinent Medical History:  Hypertension  Significant Hospital Events: Including procedures, antibiotic start and stop dates in addition to other pertinent events   3/08 - Intubated in ED, pontine bleed/SAH. CT head 17:09 Acute large hemorrhage 4.1 cm in pons with intraventricular extension without hydrocephalus 3/09 - CTA 03:14 AM No change in IPH in pons, similar biparietal [facility] 3/14 - Now awake, appears locked in can follow commands with eyes. 3/16 - Purposeful with left upper extremity  3/22 - Tracheostomy placed at bedside, bleeding issues overnight from trach site 3/24 - PEG (Dr. Sebastian) 3/24 - Sputum culture with MRSA 3/27  - Vomited. TF on hold. Abd film c/w ileus. Added reglan , got SSE. Did tolerate PSV all day  3/28 - BMs x2 after SSE day prior. Added back TFs at 1/2 rated. Tolerated PS 3/29 - Tolerated PS  3/31 - Tolerating CPAP PS 15/5, TF on hold due to ileus. 4/01 - Con't to hold tube feeds today, restarting vancomycin  and sending tracheal aspirate as peaks are 37, plat 24, driving 19. Thick secretions. Fever overnight.  4/02 - Peaks improved. Ongoing hiccups. Trickle feeding. Neuro exam unchanged.  4/20 - Trach was changed to cuffless #8 4/28 - Not safe to swallow. Limited communication and he follows simple commands. On TF 5/01 - On TC 28%, with large secretions. Working with speech and he is improving to initiate PMV trials under ST supervision   5/05 - On TC 30%, 7 L/min. Trach #6 cuffless, changed 5/1 to facilitate PMV trials  5/12 - On TC 21%, 6 L/min. Trach #6 cuffless, unable to produce sounds or speak on PMV. No significant resp secretions. On PEG TF 6/10 - Tracheostomy was removed due to failure to well provide adequate airway 02/15/2024 pulmonary critical care consult. 7/07 Patient obtunded and desaturating on NRB; transfer to icu for intubation; updated mother over phone who wants to reverse code status to full code; ent consulted 7/09 Remains intubated on no continuous sedation, right eye open but does not track movement  7/16 Revision ENT trach with [facility] Flap 7/18 Weaned on vent 5 hours  7/19 Tolerated trach collar for the majority of the day but had some increased work of breath  resulting in being placed back on vent support  7/20 No issues overnight, back on ATC this AM 7/21 Did not tolerate ATC. Placed back on PRVC. 7/24 ATC until afternoon and went on vent for WOB reportedly  7/25 ATC for a few hours  7/26 ATC x 6h 7/28 tolerating trach collar-tolerated for most of the day 7/30 on vent overnight and back on trach collar today 8/1 back on vent overnight  8/2 was able to maintain off of  ventilator overnight but by 6 AM today patient was seen with signs of increased work of breathing including tachycardia therefore patient was placed back on ventilator 8/3 remained on ventilator overnight to transition to trach collar this a.m. 8/4 did 18 hour ATC yesterday; back on vent 3am today 8/5 TC for 12-14 hours. Then went back to vent because of tachypnea.  8/6 tolerated ATC at 30% for 24 hours. Back on vent this AM for rest as was a bit tachypneic 8/11 been off vent >24 hours. 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  8/12-off vent for over 48 hours 8/18 completed abx  8/19 Changed to cuffless trach 8/20 Increasing tachypnea, hypercarbia.  Trach changed back to #6 cuffed and brought back to ICU to be placed on ventilator.  Antibiotics broadened to linezolid  and cefepime  8/23 PSV wean  Interim History / Subjective:   PSV wean 8/5   Objective:   Blood pressure (!) 148/95, pulse (!) 119, temperature 99.5 F (37.5 C), temperature source Axillary, resp. rate 20, height 5' 8 (1.727 m), weight 131 kg, SpO2 96%.    Vent Mode: PRVC FiO2 (%):  [40 %] 40 % Set Rate:  [20 bmp] 20 bmp Vt Set:  [540 mL] 540 mL PEEP:  [5 cmH20] 5 cmH20 Plateau Pressure:  [18 cmH20-21 cmH20] 19 cmH20   Intake/Output Summary (Last 24 hours) at 04/12/2024 1032 Last data filed at 04/12/2024 0700 Gross per 24 hour  Intake 1871 ml  Output 1335 ml  Net 536 ml   Filed Weights   04/10/24 0451 04/11/24 0500 04/12/24 0500  Weight: 130.1 kg 130 kg 131 kg   Physical Examination: Gen:     Chronically critically ill middle aged M  HEENT:  trach secure. Eyes are taped shut  Lungs:    Mechanically ventilated, symmetrical chest expansion CV:         rr  Abd:      Soft  Ext:    Wrist splints  Neuro: unresponsive   Resolved Problem List:  Post sedation hypotension Left eye conjunctivitis - resolved  Hyperglycemia, resolved Pseudomonas and E. 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  He is now day 33 postop for his revision.  He is not a candidate for decannulation given ineffective airway clearance Now with worsening respiratory distress, hypercarbia with concern for new HAP Plan -cont efforts at weaning vent -cont cefepime  linezolid  (recent e coli pseudomonas hcap, resis) -follow micro data  -will send a PCT  -will space out CBC/BMP monitoring, hasn't mounted a white count w c/f infection recurrence so dont think following it daily is high yield   Exposure  keratopathy -Ophtho Dr. Waylan consulted on 8/11 and placed tarsorraphy on 8/11.   -Ophthalmology will remove tarsorrhaphy in 1 week. -cont eye drops  HTN -lopressor , norvasc , PRN hydral   Inadequate PO intake  -EN   Best Practice (right click and Reselect all SmartList Selections daily)   Diet/type: tubefeeds DVT prophylaxis LMWH Pressure ulcer(s): N/A GI prophylaxis: H2B Lines: N/A Foley:  Yes, and it is still needed Code Status:  full code Last date of multidisciplinary goals of care discussion []   Critical care time:   CRITICAL CARE Performed by: Ronnald FORBES Gave  Total critical care time: 36 minutes  Critical care time was exclusive of separately billable procedures and treating other patients. Critical care was necessary to treat or prevent imminent or life-threatening deterioration.  Critical care was time spent personally by me on the following activities: development of treatment plan with patient and/or surrogate as well as nursing, discussions with consultants, evaluation of  patient's response to treatment, examination of patient, obtaining history from patient or surrogate, ordering and performing treatments and interventions, ordering and review of laboratory studies, ordering and review of radiographic studies, pulse oximetry and re-evaluation of patient's condition.  Ronnald Gave MSN, AGACNP-BC [facility] Amion for pager  04/12/2024, 10:32 AM

## 2024-04-12 NOTE — Progress Notes (Signed)
 Family member with concerns of persistent right sided facial twitching and left arm twitching . Per family member, patient was not doing this 2 days ago. She is requesting physician evaluation. Contacted Elink and spoke to Jodi. Assessment. Patient more awake today, showing right sided facial twitching around right cheek and right lip. It is intermittent in nature; left arm twitching also noted intermittently. Unable to follow commands or track appropriately

## 2024-04-12 NOTE — Plan of Care (Signed)
  Problem: Intracerebral Hemorrhage Tissue Perfusion: Goal: Complications of Intracerebral Hemorrhage will be minimized Outcome: Progressing   Problem: Nutrition: Goal: Risk of aspiration will decrease Outcome: Progressing Goal: Dietary intake will improve Outcome: Progressing   Problem: Education: Goal: Knowledge of disease or condition will improve Outcome: Progressing

## 2024-04-12 NOTE — Progress Notes (Signed)
 Pharmacy Electrolyte Replacement  Recent Labs:  Recent Labs    04/12/24 0358  K 3.7  CREATININE 0.49*    Low Critical Values (K </= 2.5, Phos </= 1, Mg </= 1) Present: None  MD Contacted: N/A  Plan: KCl 40 mEq per tube x1 per protocol.  Repeat labs in AM.   Harlene Boga, PharmD, BCPS, BCCCP Clinical Pharmacist Please refer to [facility] for [facility] Pharmacy numbers 04/12/2024, 10:45 AM

## 2024-04-12 NOTE — Progress Notes (Signed)
 EEG complete - results pending

## 2024-04-13 DIAGNOSIS — J9622 Acute and chronic respiratory failure with hypercapnia: Secondary | ICD-10-CM | POA: Diagnosis not present

## 2024-04-13 DIAGNOSIS — R569 Unspecified convulsions: Secondary | ICD-10-CM | POA: Diagnosis not present

## 2024-04-13 DIAGNOSIS — J9621 Acute and chronic respiratory failure with hypoxia: Secondary | ICD-10-CM | POA: Diagnosis not present

## 2024-04-13 DIAGNOSIS — J189 Pneumonia, unspecified organism: Secondary | ICD-10-CM | POA: Diagnosis not present

## 2024-04-13 DIAGNOSIS — I613 Nontraumatic intracerebral hemorrhage in brain stem: Secondary | ICD-10-CM | POA: Diagnosis not present

## 2024-04-13 DIAGNOSIS — G935 Compression of brain: Secondary | ICD-10-CM | POA: Diagnosis not present

## 2024-04-13 DIAGNOSIS — Z930 Tracheostomy status: Secondary | ICD-10-CM | POA: Diagnosis not present

## 2024-04-13 LAB — GLUCOSE, CAPILLARY
Glucose-Capillary: 87 mg/dL (ref 70–99)
Glucose-Capillary: 114 mg/dL — ABNORMAL HIGH (ref 70–99)
Glucose-Capillary: 117 mg/dL — ABNORMAL HIGH (ref 70–99)
Glucose-Capillary: 135 mg/dL — ABNORMAL HIGH (ref 70–99)
Glucose-Capillary: 88 mg/dL (ref 70–99)
Glucose-Capillary: 106 mg/dL — ABNORMAL HIGH (ref 70–99)

## 2024-04-13 NOTE — Plan of Care (Signed)
  Problem: Intracerebral Hemorrhage Tissue Perfusion: Goal: Complications of Intracerebral Hemorrhage will be minimized Outcome: Progressing   Problem: Nutrition: Goal: Risk of aspiration will decrease Outcome: Progressing Goal: Dietary intake will improve Outcome: Progressing   Problem: Education: Goal: Knowledge of disease or condition will improve Outcome: Progressing

## 2024-04-13 NOTE — Procedures (Signed)
 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 EEG to evaluate for seizure.  Level of alertness: Awake  AEDs during EEG study: None  Technical aspects: This EEG study was done with scalp electrodes positioned according to the 10-20 International system of electrode placement. Electrical activity was reviewed with band pass filter of 1-70Hz , sensitivity of 7 uV/mm, display speed of 60mm/sec with a 60Hz  notched filter applied as appropriate. EEG data were recorded continuously and digitally stored.  Video monitoring was available and reviewed as appropriate.  Description: The posterior dominant rhythm consists of 8 Hz activity of moderate voltage (25-35 uV) seen predominantly in posterior head regions, symmetric and reactive to eye opening and eye closing. EEG showed intermittent generalized 5 to 7 Hz theta slowing. Hyperventilation and photic stimulation were not performed.     ABNORMALITY - Intermittent slow, generalized  IMPRESSION: This study is suggestive of mild diffuse encephalopathy. No seizures or epileptiform discharges were seen throughout the recording.  Kwame Ryland O Khamiya Varin

## 2024-04-13 NOTE — Plan of Care (Signed)
  Problem: Intracerebral Hemorrhage Tissue Perfusion: Goal: Complications of Intracerebral Hemorrhage will be minimized Outcome: Progressing   Problem: Nutrition: Goal: Risk of aspiration will decrease 04/13/2024 0501 by Ruffus Michelene KIDD, RN Outcome: Progressing 04/13/2024 0408 by Ruffus Michelene KIDD, RN Outcome: Progressing Goal: Dietary intake will improve 04/13/2024 0501 by Ruffus Michelene KIDD, RN Outcome: Progressing 04/13/2024 0408 by Ruffus Michelene KIDD, RN Outcome: Progressing   Problem: Education: Goal: Knowledge of disease or condition will improve Outcome: Progressing   Problem: Intracerebral Hemorrhage Tissue Perfusion: Goal: Complications of Intracerebral Hemorrhage will be minimized Outcome: Progressing

## 2024-04-13 NOTE — Progress Notes (Signed)
 NAME:  Garrett [name], MRN:  [id number], DOB:  1976-05-13, LOS: 169 ADMISSION DATE:  10/27/2023, CONSULTATION DATE:  10/27/2023 REFERRING MD: Regalado - TRH, CHIEF COMPLAINT:  Found down   History of Present Illness:  48 y/o man who was found down at home. PMHx significant for HTN.  Patient was down for an unknown amount of time, found by his fiance when she came home from work, reportedly with agonal breathing. Patient had driven her to work the morning of admission.  He has HTN but never checks his BP and does not follow with MD. Patient's fiance states that she can tell when his BP is really high; his eyes get blood shot and he is sweating. No h/o seizures. Besides almost daily marijuana and cigarette smoking, she is not aware of any other drug use. Drug screen positive for opioids and he was given several doses of Narcan . 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 BP in ED 262/156. CXR revealed RUL and perihilar infiltrates suggesting aspiration pneumonia. ED labs also revealed PH 7.169, LA 4.4, CPK 985. Patient was intubated in ED.  PCCM consulted.  Pertinent Medical History:  Hypertension  Significant Hospital Events: Including procedures, antibiotic start and stop dates in addition to other pertinent events   3/08 - Intubated in ED, pontine bleed/SAH. CT head 17:09 Acute large hemorrhage 4.1 cm in pons with intraventricular extension without hydrocephalus 3/09 - CTA 03:14 AM No change in IPH in pons, similar biparietal [facility] 3/14 - Now awake, appears locked in can follow commands with eyes. 3/16 - Purposeful with left upper extremity  3/22 - Tracheostomy placed at bedside, bleeding issues overnight from trach site 3/24 - PEG (Dr. Sebastian) 3/24 - Sputum culture with MRSA 3/27  - Vomited. TF on hold. Abd film c/w ileus. Added reglan , got SSE. Did tolerate PSV all day  3/28 - BMs x2 after SSE day prior. Added back TFs at 1/2 rated. Tolerated PS 3/29 - Tolerated PS  3/31 - Tolerating CPAP PS 15/5, TF on hold due to ileus. 4/01 - Con't to hold tube feeds today, restarting vancomycin  and sending tracheal aspirate as peaks are 37, plat 24, driving 19. Thick secretions. Fever overnight.  4/02 - Peaks improved. Ongoing hiccups. Trickle feeding. Neuro exam unchanged.  4/20 - Trach was changed to cuffless #8 4/28 - Not safe to swallow. Limited communication and he follows simple commands. On TF 5/01 - On TC 28%, with large secretions. Working with speech and he is improving to initiate PMV trials under ST supervision   5/05 - On TC 30%, 7 L/min. Trach #6 cuffless, changed 5/1 to facilitate PMV trials  5/12 - On TC 21%, 6 L/min. Trach #6 cuffless, unable to produce sounds or speak on PMV. No significant resp secretions. On PEG TF 6/10 - Tracheostomy was removed due to failure to well provide adequate airway 02/15/2024 pulmonary critical care consult. 7/07 Patient obtunded and desaturating on NRB; transfer to icu for intubation; updated mother over phone who wants to reverse code status to full code; ent consulted 7/09 Remains intubated on no continuous sedation, right eye open but does not track movement  7/16 Revision ENT trach with [facility] Flap 7/18 Weaned on vent 5 hours  7/19 Tolerated trach collar for the majority of the day but had some increased work of breath  resulting in being placed back on vent support  7/20 No issues overnight, back on ATC this AM 7/21 Did not tolerate ATC. Placed back on PRVC. 7/24 ATC until afternoon and went on vent for WOB reportedly  7/25 ATC for a few hours  7/26 ATC x 6h 7/28 tolerating trach collar-tolerated for most of the day 7/30 on vent overnight and back on trach collar today 8/1 back on vent overnight  8/2 was able to maintain off of  ventilator overnight but by 6 AM today patient was seen with signs of increased work of breathing including tachycardia therefore patient was placed back on ventilator 8/3 remained on ventilator overnight to transition to trach collar this a.m. 8/4 did 18 hour ATC yesterday; back on vent 3am today 8/5 TC for 12-14 hours. Then went back to vent because of tachypnea.  8/6 tolerated ATC at 30% for 24 hours. Back on vent this AM for rest as was a bit tachypneic 8/11 been off vent >24 hours. 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  8/12-off vent for over 48 hours 8/18 completed abx  8/19 Changed to cuffless trach 8/20 Increasing tachypnea, hypercarbia.  Trach changed back to #6 cuffed and brought back to ICU to be placed on ventilator.  Antibiotics broadened to linezolid  and cefepime  8/23 PSV wean 8/24 eeg for twitching no sz  Interim History / Subjective:   Twitching overnight not sz on eeg   Objective:   Blood pressure 107/74, pulse 75, temperature 98.9 F (37.2 C), temperature source Oral, resp. rate 20, height 5' 8 (1.727 m), weight 130 kg, SpO2 100%.    Vent Mode: PRVC FiO2 (%):  [40 %] 40 % Set Rate:  [20 bmp] 20 bmp Vt Set:  [540 mL] 540 mL PEEP:  [5 cmH20] 5 cmH20 Pressure Support:  [10 cmH20] 10 cmH20 Plateau Pressure:  [15 cmH20-28 cmH20] 15 cmH20   Intake/Output Summary (Last 24 hours) at 04/13/2024 1328 Last data filed at 04/13/2024 1249 Gross per 24 hour  Intake 1958 ml  Output 1100 ml  Net 858 ml   Filed Weights   04/11/24 0500 04/12/24 0500 04/13/24 0449  Weight: 130 kg 131 kg 130 kg   Physical Examination: Gen:     Chronically/critically ill M  HEENT:  Eyes taped, trach midline  Lungs:    Mechanically ventilated  CV:         rr  Abd:      Ndnt  Ext:    No obvious acute joint deformity  Neuro: unresponsive   Resolved Problem List:  Post sedation hypotension Left eye conjunctivitis - resolved   Hyperglycemia, resolved Pseudomonas and E. 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  He is now day 33 postop for his revision.  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   -Ophthalmology will remove tarsorrhaphy in 1 week. -cont eye drops  HTN -lopressor , norvasc , PRN hydral   Inadequate PO intake  -EN   Best Practice (right click and Reselect all SmartList Selections daily)   Diet/type: tubefeeds DVT prophylaxis LMWH Pressure ulcer(s): N/A GI prophylaxis: H2B Lines: N/A Foley:  Yes, and it is still needed Code Status:  full code Last date of multidisciplinary goals of care discussion []   Critical care time:   CRITICAL CARE Performed by: Ronnald FORBES Gave   Total critical care time: 35 minutes  Critical care time was exclusive of separately billable procedures and treating other patients. Critical care was necessary to treat or prevent imminent or life-threatening deterioration.  Critical care was time spent  personally by me on the following activities: development of treatment plan with patient and/or surrogate as well as nursing, discussions with consultants, evaluation of patient's response to treatment, examination of patient, obtaining history from patient or surrogate, ordering and performing treatments and interventions, ordering and review of laboratory studies, ordering and review of radiographic studies, pulse oximetry and re-evaluation of patient's condition.  Ronnald Gave MSN, AGACNP-BC [place] [facility] Amion for pager  04/13/2024, 1:28 PM

## 2024-04-13 NOTE — Plan of Care (Signed)
  Problem: Intracerebral Hemorrhage Tissue Perfusion: Goal: Complications of Intracerebral Hemorrhage will be minimized Outcome: Progressing   Problem: Nutrition: Goal: Risk of aspiration will decrease Outcome: Progressing   Problem: Education: Goal: Knowledge of disease or condition will improve Outcome: Progressing

## 2024-04-13 NOTE — Progress Notes (Signed)
 Pt placed on PS/CPAP mode 10/5 but did not tolerate due to low Mve of 3.7. Pt flipped back to full support for now. Pt stable at this time

## 2024-04-14 DIAGNOSIS — J9622 Acute and chronic respiratory failure with hypercapnia: Secondary | ICD-10-CM | POA: Diagnosis not present

## 2024-04-14 DIAGNOSIS — J189 Pneumonia, unspecified organism: Secondary | ICD-10-CM | POA: Diagnosis not present

## 2024-04-14 DIAGNOSIS — I613 Nontraumatic intracerebral hemorrhage in brain stem: Secondary | ICD-10-CM | POA: Diagnosis not present

## 2024-04-14 DIAGNOSIS — Z930 Tracheostomy status: Secondary | ICD-10-CM | POA: Diagnosis not present

## 2024-04-14 DIAGNOSIS — J9621 Acute and chronic respiratory failure with hypoxia: Secondary | ICD-10-CM | POA: Diagnosis not present

## 2024-04-14 DIAGNOSIS — G935 Compression of brain: Secondary | ICD-10-CM | POA: Diagnosis not present

## 2024-04-14 LAB — GLUCOSE, CAPILLARY
Glucose-Capillary: 95 mg/dL (ref 70–99)
Glucose-Capillary: 112 mg/dL — ABNORMAL HIGH (ref 70–99)
Glucose-Capillary: 128 mg/dL — ABNORMAL HIGH (ref 70–99)
Glucose-Capillary: 100 mg/dL — ABNORMAL HIGH (ref 70–99)
Glucose-Capillary: 122 mg/dL — ABNORMAL HIGH (ref 70–99)
Glucose-Capillary: 85 mg/dL (ref 70–99)

## 2024-04-14 LAB — BASIC METABOLIC PANEL WITH GFR
Anion gap: 8 (ref 5–15)
BUN: 21 mg/dL — ABNORMAL HIGH (ref 6–20)
CO2: 22 mmol/L (ref 22–32)
Calcium: 9.1 mg/dL (ref 8.9–10.3)
Chloride: 105 mmol/L (ref 98–111)
Creatinine, Ser: 0.46 mg/dL — ABNORMAL LOW (ref 0.61–1.24)
GFR, Estimated: >60 mL/min (ref >60)
Glucose, Bld: 112 mg/dL — ABNORMAL HIGH (ref 70–99)
Potassium: 3.9 mmol/L (ref 3.5–5.1)
Sodium: 135 mmol/L (ref 135–145)

## 2024-04-14 LAB — CBC
HCT: 33.3 % — ABNORMAL LOW (ref 39.0–52.0)
Hemoglobin: 9.8 g/dL — ABNORMAL LOW (ref 13.0–17.0)
MCH: 27 pg (ref 26.0–34.0)
MCHC: 29.4 g/dL — ABNORMAL LOW (ref 30.0–36.0)
MCV: 91.7 fL (ref 80.0–100.0)
Platelets: 383 K/uL (ref 150–400)
RBC: 3.63 MIL/uL — ABNORMAL LOW (ref 4.22–5.81)
RDW: 16.9 % — ABNORMAL HIGH (ref 11.5–15.5)
WBC: 6.7 K/uL (ref 4.0–10.5)
nRBC: 0 % (ref 0.0–0.2)

## 2024-04-14 MED ORDER — ERYTHROMYCIN 5 MG/GM OP OINT
TOPICAL_OINTMENT | OPHTHALMIC | Status: DC
  Administered 2024-04-14 – 2024-05-15 (×338): 1 via OPHTHALMIC
  Filled 2024-04-14 (×20): qty 1
  Filled 2024-04-14: qty 2
  Filled 2024-04-14 (×2): qty 1
  Filled 2024-04-14: qty 2
  Filled 2024-04-14 (×20): qty 1
  Filled 2024-04-14: qty 2
  Filled 2024-04-14 (×4): qty 1
  Filled 2024-04-14: qty 3
  Filled 2024-04-14: qty 6
  Filled 2024-04-14 (×8): qty 1
  Filled 2024-04-14: qty 2
  Filled 2024-04-14 (×19): qty 1

## 2024-04-14 NOTE — Plan of Care (Signed)
  Problem: Intracerebral Hemorrhage Tissue Perfusion: Goal: Complications of Intracerebral Hemorrhage will be minimized 04/14/2024 0519 by Ruffus Michelene KIDD, RN Outcome: Progressing 04/13/2024 1906 by Ruffus Michelene KIDD, RN Outcome: Progressing   Problem: Nutrition: Goal: Risk of aspiration will decrease 04/14/2024 0519 by Ruffus Michelene KIDD, RN Outcome: Progressing 04/13/2024 1906 by Ruffus Michelene KIDD, RN Outcome: Progressing   Problem: Education: Goal: Knowledge of disease or condition will improve Outcome: Progressing   Problem: Intracerebral Hemorrhage Tissue Perfusion: Goal: Complications of Intracerebral Hemorrhage will be minimized 04/14/2024 0519 by Ruffus Michelene KIDD, RN Outcome: Progressing 04/13/2024 1906 by Ruffus Michelene KIDD, RN Outcome: Progressing   Problem: Nutrition: Goal: Risk of aspiration will decrease 04/14/2024 0519 by Ruffus Michelene KIDD, RN Outcome: Progressing 04/13/2024 1906 by Ruffus Michelene KIDD, RN Outcome: Progressing   Problem: Education: Goal: Knowledge of disease or condition will improve Outcome: Progressing

## 2024-04-14 NOTE — Progress Notes (Signed)
 NAME:  Garrett [name], MRN:  [id number], DOB:  [birth year], LOS: 170 ADMISSION DATE:  10/27/2023, CONSULTATION DATE:  10/27/2023 REFERRING MD: Madelyne - TRH, CHIEF COMPLAINT:  Found down   History of Present Illness:  48 y/o man who was found down at home. PMHx significant for HTN.  Patient was down for an unknown amount of time, found by his fiance when she came home from work, reportedly with agonal breathing. Patient had driven her to work the morning of admission.  He has HTN but never checks his BP and does not follow with MD. Patient's fiance states that she can tell when his BP is really high; his eyes get blood shot and he is sweating. No h/o seizures. Besides almost daily marijuana and cigarette smoking, she is not aware of any other drug use. Drug screen positive for opioids and he was given several doses of Narcan . 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 BP in ED 262/156. CXR revealed RUL and perihilar infiltrates suggesting aspiration pneumonia. ED labs also revealed PH 7.169, LA 4.4, CPK 985. Patient was intubated in ED.  PCCM consulted.  Pertinent Medical History:  Hypertension  Significant Hospital Events: Including procedures, antibiotic start and stop dates in addition to other pertinent events   3/08 - Intubated in ED, pontine bleed/SAH. CT head 17:09 Acute large hemorrhage 4.1 cm in pons with intraventricular extension without hydrocephalus 3/09 - CTA 03:14 AM No change in IPH in pons, similar biparietal [facility] 3/14 - Now awake, appears locked in can follow commands with eyes. 3/16 - Purposeful with left upper extremity  3/22 - Tracheostomy placed at bedside, bleeding issues overnight from trach site 3/24 - PEG (Dr. Sebastian) 3/24 - Sputum culture with MRSA 3/27  - Vomited. TF on hold. Abd film c/w ileus. Added reglan , got SSE. Did tolerate PSV all day  3/28 - BMs x2 after SSE day prior. Added back TFs at 1/2 rated. Tolerated PS 3/29 - Tolerated PS  3/31 - Tolerating CPAP PS 15/5, TF on hold due to ileus. 4/01 - Con't to hold tube feeds today, restarting vancomycin  and sending tracheal aspirate as peaks are 37, plat 24, driving 19. Thick secretions. Fever overnight.  4/02 - Peaks improved. Ongoing hiccups. Trickle feeding. Neuro exam unchanged.  4/20 - Trach was changed to cuffless #8 4/28 - Not safe to swallow. Limited communication and he follows simple commands. On TF 5/01 - On TC 28%, with large secretions. Working with speech and he is improving to initiate PMV trials under ST supervision   5/05 - On TC 30%, 7 L/min. Trach #6 cuffless, changed 5/1 to facilitate PMV trials  5/12 - On TC 21%, 6 L/min. Trach #6 cuffless, unable to produce sounds or speak on PMV. No significant resp secretions. On PEG TF 6/10 - Tracheostomy was removed due to failure to well provide adequate airway 02/15/2024 pulmonary critical care consult. 7/07 Patient obtunded and desaturating on NRB; transfer to icu for intubation; updated mother over phone who wants to reverse code status to full code; ent consulted 7/09 Remains intubated on no continuous sedation, right eye open but does not track movement  7/16 Revision ENT trach with [facility] Flap 7/18 Weaned on vent 5 hours  7/19 Tolerated trach collar for the majority of the day but had some increased work of breath  resulting in being placed back on vent support  7/20 No issues overnight, back on ATC this AM 7/21 Did not tolerate ATC. Placed back on PRVC. 7/24 ATC until afternoon and went on vent for WOB reportedly  7/25 ATC for a few hours  7/26 ATC x 6h 7/28 tolerating trach collar-tolerated for most of the day 7/30 on vent overnight and back on trach collar today 8/1 back on vent overnight  8/2 was able to maintain off of  ventilator overnight but by 6 AM today patient was seen with signs of increased work of breathing including tachycardia therefore patient was placed back on ventilator 8/3 remained on ventilator overnight to transition to trach collar this a.m. 8/4 did 18 hour ATC yesterday; back on vent 3am today 8/5 TC for 12-14 hours. Then went back to vent because of tachypnea.  8/6 tolerated ATC at 30% for 24 hours. Back on vent this AM for rest as was a bit tachypneic 8/11 been off vent >24 hours. 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  8/12-off vent for over 48 hours 8/18 completed abx  8/19 Changed to cuffless trach 8/20 Increasing tachypnea, hypercarbia.  Trach changed back to #6 cuffed and brought back to ICU to be placed on ventilator.  Antibiotics broadened to linezolid  and cefepime  8/23 PSV wean 8/24 eeg for twitching no sz  Interim History / Subjective:   Having difficulty weaning from vent Tmax 99.8 F   Objective:   Blood pressure 115/87, pulse 78, temperature 99.8 F (37.7 C), resp. rate 20, height 5' 8 (1.727 m), weight 130 kg, SpO2 100%.    Vent Mode: PRVC FiO2 (%):  [40 %] 40 % Set Rate:  [20 bmp] 20 bmp Vt Set:  [540 mL] 540 mL PEEP:  [5 cmH20] 5 cmH20 Pressure Support:  [10 cmH20] 10 cmH20 Plateau Pressure:  [15 cmH20-22 cmH20] 22 cmH20   Intake/Output Summary (Last 24 hours) at 04/14/2024 0740 Last data filed at 04/14/2024 0518 Gross per 24 hour  Intake 2510 ml  Output 1150 ml  Net 1360 ml   Filed Weights   04/12/24 0500 04/13/24 0449 04/14/24 0500  Weight: 131 kg 130 kg 130 kg   Physical Examination: General: chronically ill appearing on mech vent HEENT: MM pink/moist; ETT in place Neuro: unresponsive; cough/gag reflex present CV: s1s2, RRR, no m/r/g PULM:  dim clear BS bilaterally; on mech vent PRVC; trach present GI: soft, bsx4 active  Extremities: warm/dry,  Resolved Problem List:  Post sedation  hypotension Left eye conjunctivitis - resolved  Hyperglycemia, resolved Pseudomonas and E. 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  He is now day 33 postop for his revision.  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 Waylan consulted on 8/11 and placed tarsorraphy on 8/11.   Plan: -cont erythromycin  eye drops per Optho  HTN Plan: -lopressor , norvasc , PRN hydral   Inadequate PO intake  Plan: -EN   Best Practice (right click and Reselect all SmartList Selections daily)   Diet/type: tubefeeds DVT prophylaxis LMWH Pressure ulcer(s): N/A GI prophylaxis: H2B Lines: N/A Foley:  Yes, and it is still needed Code Status:  full code Last date of multidisciplinary goals of care discussion []   Critical care time:   CRITICAL CARE Performed by: Norleen JONETTA Cedar   Total critical care time: 35 minutes  JD Cedar RIGGERS [place] [facility] 04/14/2024, 9:06 AM  Please see [email or web address] for pager details.  From 7A-7P if no response, please call [phone]. After hours, please call ELink [phone].

## 2024-04-14 NOTE — Consult Note (Addendum)
 OPHTHALMOLOGY CONSULT NOTE  Date: 6/25/25Time: 7:44 AM  Patient Name: Garrett [name]  DOB: 02/09/1976 MRN: [id number]  Reason for Consult: Exposure keratopathy  HPI:  Here for follow up on exposure keratopathy sp tarsorrhaphy. [name] noted taped over eyes upon evaluation due to mild skin break down on upper lids OD. Discussed with nursing the importance of not using gauze due to risk of abrasion.   Prior to Admission medications   Medication Sig Start Date End Date Taking? Authorizing Provider  acetaminophen  (TYLENOL ) 500 MG tablet Take 1,000 mg by mouth every 6 (six) hours as needed for mild pain (pain score 1-3) or headache.   Yes [provider]  ibuprofen (ADVIL) 200 MG tablet Take 600 mg by mouth every 6 (six) hours as needed for mild pain (pain score 1-3).   Yes [provider]    History reviewed. No pertinent past medical history.  family history is not on file.  Social History   Occupational History   Not on file  Tobacco Use   Smoking status: Every Day    Current packs/day: 0.50    Average packs/day: 0.5 packs/day for 30.6 years (15.3 ttl pk-yrs)    Types: Cigarettes    Start date: 1995   Smokeless tobacco: Never  Substance and Sexual Activity   Alcohol  use: Not Currently   Drug use: Never   Sexual activity: Not on file    Allergies  Allergen Reactions   Tomato Hives, Itching and Rash    ROS: Positive as above, otherwise negative.  EXAM:  Mental Status: Minimal reaction  Base Exam: Right Eye Left Eye  Visual Acuity (At near) Unable Unable  IOP (Tonopen) 12 12  Pupillary Exam 2 mm min rxn ou 2mm min rxn ou  Motility Roving Eye Movements Roving Eye movements,generally maintains a down and out position  Confrontation VF Unable Unable   Anterior Segment Exam    Lids/Lashes Mild central lid skin breakdown Tarsorrhaphy in place without cheese-wiring.   Conjuctiva White and Quiet Trace injection, vastly better than prior noted inferior  temporally where tarsorrhaphy coverage was not as robust.   Cornea Clear Diffuse corneal scarring with no sign of infiltrate. Epithelium smooth without defect.   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  - Scarring is new from Dr. Tobie evaluation.  -DC gatifloxacin  Q2h -Corneal appearance has remained stable throughout repeat examinations,so don't think current opacification represents current infiltrate. -DC Lacrilube, Change to erythromycin  ointment q2h both eyes, to reduce risk of furniture corneal ulceration should keratopathy worsen.   -Care notes made for nursing.   -Tape lids only if eyes remaining open using paper tape.   *Re-consult ophthalmology if care concerns or any change in either eye appearance or redness. .   Please call with any questions.  Adine Haddock MD [facility] Ophthalmology [phone]

## 2024-04-14 NOTE — Progress Notes (Signed)
 D/w CCM, will change eye abx per ophthalmology recommendation.   Sergio Batch, PharmD, BCIDP, AAHIVP, CPP Infectious Disease Pharmacist 04/14/2024 8:51 AM

## 2024-04-15 DIAGNOSIS — Z930 Tracheostomy status: Secondary | ICD-10-CM | POA: Diagnosis not present

## 2024-04-15 DIAGNOSIS — J9621 Acute and chronic respiratory failure with hypoxia: Secondary | ICD-10-CM | POA: Diagnosis not present

## 2024-04-15 DIAGNOSIS — I619 Nontraumatic intracerebral hemorrhage, unspecified: Secondary | ICD-10-CM | POA: Diagnosis not present

## 2024-04-15 DIAGNOSIS — J9622 Acute and chronic respiratory failure with hypercapnia: Secondary | ICD-10-CM

## 2024-04-15 LAB — GLUCOSE, CAPILLARY
Glucose-Capillary: 96 mg/dL (ref 70–99)
Glucose-Capillary: 133 mg/dL — ABNORMAL HIGH (ref 70–99)
Glucose-Capillary: 113 mg/dL — ABNORMAL HIGH (ref 70–99)
Glucose-Capillary: 140 mg/dL — ABNORMAL HIGH (ref 70–99)
Glucose-Capillary: 99 mg/dL (ref 70–99)

## 2024-04-15 MED ORDER — ORAL CARE MOUTH RINSE: 15.0000 mL | OROMUCOSAL | Status: DC | PRN

## 2024-04-15 MED ORDER — ORAL CARE MOUTH RINSE
15.0000 mL | OROMUCOSAL | Status: DC
Start: 2024-04-15 — End: 2024-05-12
  Administered 2024-04-15 – 2024-05-12 (×315): 15 mL via OROMUCOSAL

## 2024-04-15 NOTE — Progress Notes (Signed)
 RT removed pt from full ventilatory support and placed pt on ATC 12L 40% w/deflated cuff. Pt tolerating well with SVS and no complications. RN notified.

## 2024-04-15 NOTE — Progress Notes (Signed)
 NAME:  Garrett [name], MRN:  [id number], DOB:  02/15/1976, LOS: 171 ADMISSION DATE:  10/27/2023, CONSULTATION DATE:  10/27/2023 REFERRING MD: Regalado - TRH, CHIEF COMPLAINT:  Found down   History of Present Illness:  48 y/o man who was found down at home. PMHx significant for HTN.  Patient was down for an unknown amount of time, found by his fiance when she came home from work, reportedly with agonal breathing. Patient had driven her to work the morning of admission.  He has HTN but never checks his BP and does not follow with MD. Patient's fiance states that she can tell when his BP is really high; his eyes get blood shot and he is sweating. No h/o seizures. Besides almost daily marijuana and cigarette smoking, she is not aware of any other drug use. Drug screen positive for opioids and he was given several doses of Narcan . 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 BP in ED 262/156. CXR revealed RUL and perihilar infiltrates suggesting aspiration pneumonia. ED labs also revealed PH 7.169, LA 4.4, CPK 985. Patient was intubated in ED.  PCCM consulted.  Pertinent Medical History:  Hypertension  Significant Hospital Events: Including procedures, antibiotic start and stop dates in addition to other pertinent events   3/08 - Intubated in ED, pontine bleed/SAH. CT head 17:09 Acute large hemorrhage 4.1 cm in pons with intraventricular extension without hydrocephalus 3/09 - CTA 03:14 AM No change in IPH in pons, similar biparietal [facility] 3/14 - Now awake, appears locked in can follow commands with eyes. 3/16 - Purposeful with left upper extremity  3/22 - Tracheostomy placed at bedside, bleeding issues overnight from trach site 3/24 - PEG (Dr. Sebastian) 3/24 - Sputum culture with MRSA 3/27  - Vomited. TF on hold. Abd film c/w ileus. Added reglan , got SSE. Did tolerate PSV all day  3/28 - BMs x2 after SSE day prior. Added back TFs at 1/2 rated. Tolerated PS 3/29 - Tolerated PS  3/31 - Tolerating CPAP PS 15/5, TF on hold due to ileus. 4/01 - Con't to hold tube feeds today, restarting vancomycin  and sending tracheal aspirate as peaks are 37, plat 24, driving 19. Thick secretions. Fever overnight.  4/02 - Peaks improved. Ongoing hiccups. Trickle feeding. Neuro exam unchanged.  4/20 - Trach was changed to cuffless #8 4/28 - Not safe to swallow. Limited communication and he follows simple commands. On TF 5/01 - On TC 28%, with large secretions. Working with speech and he is improving to initiate PMV trials under ST supervision   5/05 - On TC 30%, 7 L/min. Trach #6 cuffless, changed 5/1 to facilitate PMV trials  5/12 - On TC 21%, 6 L/min. Trach #6 cuffless, unable to produce sounds or speak on PMV. No significant resp secretions. On PEG TF 6/10 - Tracheostomy was removed due to failure to well provide adequate airway 02/15/2024 pulmonary critical care consult. 7/07 Patient obtunded and desaturating on NRB; transfer to icu for intubation; updated mother over phone who wants to reverse code status to full code; ent consulted 7/09 Remains intubated on no continuous sedation, right eye open but does not track movement  7/16 Revision ENT trach with [facility] Flap 7/18 Weaned on vent 5 hours  7/19 Tolerated trach collar for the majority of the day but had some increased work of breath  resulting in being placed back on vent support  7/20 No issues overnight, back on ATC this AM 7/21 Did not tolerate ATC. Placed back on PRVC. 7/24 ATC until afternoon and went on vent for WOB reportedly  7/25 ATC for a few hours  7/26 ATC x 6h 7/28 tolerating trach collar-tolerated for most of the day 7/30 on vent overnight and back on trach collar today 8/1 back on vent overnight  8/2 was able to maintain off of  ventilator overnight but by 6 AM today patient was seen with signs of increased work of breathing including tachycardia therefore patient was placed back on ventilator 8/3 remained on ventilator overnight to transition to trach collar this a.m. 8/4 did 18 hour ATC yesterday; back on vent 3am today 8/5 TC for 12-14 hours. Then went back to vent because of tachypnea.  8/6 tolerated ATC at 30% for 24 hours. Back on vent this AM for rest as was a bit tachypneic 8/11 been off vent >24 hours. 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  8/12-off vent for over 48 hours 8/18 completed abx  8/19 Changed to cuffless trach 8/20 Increasing tachypnea, hypercarbia.  Trach changed back to #6 cuffed and brought back to ICU to be placed on ventilator.  Antibiotics broadened to linezolid  and cefepime  8/23 PSV wean 8/24 eeg for twitching no sz 8/26 on psv trials  Interim History / Subjective:   Started on PSV trials today   Objective:   Blood pressure 122/79, pulse 84, temperature 98 F (36.7 C), temperature source Oral, resp. rate 13, height 5' 8 (1.727 m), weight 136 kg, SpO2 100%.    Vent Mode: PRVC FiO2 (%):  [40 %] 40 % Set Rate:  [12 bmp-20 bmp] 12 bmp Vt Set:  [540 mL] 540 mL PEEP:  [5 cmH20] 5 cmH20 Plateau Pressure:  [17 cmH20-22 cmH20] 22 cmH20   Intake/Output Summary (Last 24 hours) at 04/15/2024 0727 Last data filed at 04/15/2024 0602 Gross per 24 hour  Intake 2435 ml  Output 1025 ml  Net 1410 ml   Filed Weights   04/13/24 0449 04/14/24 0500 04/15/24 0600  Weight: 130 kg 130 kg 136 kg   Physical Examination: General: chronically ill appearing on mech vent HEENT: MM pink/moist; ETT in place Neuro: unresponsive; cough/gag reflex present CV: s1s2, RRR, no m/r/g PULM:  dim clear BS bilaterally; on mech vent PRVC; trach present GI: soft, bsx4 active  Extremities: warm/dry  Resolved Problem List:  Post sedation hypotension Left  eye conjunctivitis - resolved  Hyperglycemia, resolved Pseudomonas and E. 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  He is now day 33 postop for his revision.  He is not a candidate for decannulation given ineffective airway clearance Now with worsening respiratory distress, hypercarbia with concern for new HAP Plan: -PSV trials during day as tolerated; rest on prvc at night and prn during day -LTVV strategy with tidal volumes of 6-8 cc/kg ideal body weight -Wean PEEP/FiO2 for SpO2 >92% -VAP bundle in place -trach care per protocol -cefepime  completed; follow trach aspirate  Exposure  keratopathy -Ophtho Dr. Waylan consulted on 8/11 and placed tarsorraphy on 8/11.   Plan: -cont erythromycin  eye drops per Optho  HTN Plan: -lopressor , norvasc , PRN hydral   Inadequate PO intake  Plan: -EN   Best Practice (right click and Reselect all SmartList Selections daily)   Diet/type: tubefeeds DVT prophylaxis LMWH Pressure ulcer(s): N/A GI prophylaxis: H2B Lines: N/A Foley:  Yes, and it is still needed Code Status:  full code Last date of multidisciplinary goals of care discussion [8/26 updated mother [place] over phone]  Critical care time:   CRITICAL CARE Performed by: Norleen JONETTA Cedar   Total critical care time: 35 minutes  JD Cedar RIGGERS [facility] 04/15/2024, 7:27 AM  Please see [email or web address] for pager details.  From 7A-7P if no response, please call [phone]. After hours, please call ELink [phone].

## 2024-04-15 NOTE — Plan of Care (Signed)
  Problem: Intracerebral Hemorrhage Tissue Perfusion: Goal: Complications of Intracerebral Hemorrhage will be minimized Outcome: Progressing   Problem: Nutrition: Goal: Risk of aspiration will decrease Outcome: Progressing   Problem: Nutrition: Goal: Dietary intake will improve Outcome: Progressing   Problem: Fluid Volume: Goal: Ability to maintain a balanced intake and output will improve Outcome: Progressing   Problem: Skin Integrity: Goal: Risk for impaired skin integrity will decrease Outcome: Progressing   Problem: Tissue Perfusion: Goal: Adequacy of tissue perfusion will improve Outcome: Progressing   Problem: Clinical Measurements: Goal: Ability to maintain clinical measurements within normal limits will improve Outcome: Progressing   Problem: Clinical Measurements: Goal: Will remain free from infection Outcome: Progressing   Problem: Clinical Measurements: Goal: Diagnostic test results will improve Outcome: Progressing   Problem: Clinical Measurements: Goal: Respiratory complications will improve Outcome: Progressing   Problem: Clinical Measurements: Goal: Cardiovascular complication will be avoided Outcome: Progressing   Problem: Nutrition: Goal: Adequate nutrition will be maintained Outcome: Progressing   Problem: Pain Managment: Goal: General experience of comfort will improve and/or be controlled Outcome: Progressing   Problem: Safety: Goal: Ability to remain free from injury will improve Outcome: Progressing   Problem: Skin Integrity: Goal: Risk for impaired skin integrity will decrease Outcome: Progressing   Plan of care, assessment, monitoring, treatment and intervention (s) ongoing see MAR see flowsheet

## 2024-04-16 DIAGNOSIS — J9621 Acute and chronic respiratory failure with hypoxia: Secondary | ICD-10-CM | POA: Diagnosis not present

## 2024-04-16 DIAGNOSIS — Z930 Tracheostomy status: Secondary | ICD-10-CM | POA: Diagnosis not present

## 2024-04-16 DIAGNOSIS — J9622 Acute and chronic respiratory failure with hypercapnia: Secondary | ICD-10-CM | POA: Diagnosis not present

## 2024-04-16 DIAGNOSIS — I619 Nontraumatic intracerebral hemorrhage, unspecified: Secondary | ICD-10-CM | POA: Diagnosis not present

## 2024-04-16 LAB — BASIC METABOLIC PANEL WITH GFR
Anion gap: 8 (ref 5–15)
BUN: 15 mg/dL (ref 6–20)
CO2: 28 mmol/L (ref 22–32)
Calcium: 9.5 mg/dL (ref 8.9–10.3)
Chloride: 99 mmol/L (ref 98–111)
Creatinine, Ser: 0.44 mg/dL — ABNORMAL LOW (ref 0.61–1.24)
GFR, Estimated: >60 mL/min (ref >60)
Glucose, Bld: 98 mg/dL (ref 70–99)
Potassium: 3.8 mmol/L (ref 3.5–5.1)
Sodium: 135 mmol/L (ref 135–145)

## 2024-04-16 LAB — POCT I-STAT 7, (LYTES, BLD GAS, ICA,H+H)
Acid-Base Excess: 6 mmol/L — ABNORMAL HIGH (ref 0.0–2.0)
Bicarbonate: 33 mmol/L — ABNORMAL HIGH (ref 20.0–28.0)
Calcium, Ion: 1.29 mmol/L (ref 1.15–1.40)
HCT: 34 % — ABNORMAL LOW (ref 39.0–52.0)
Hemoglobin: 11.6 g/dL — ABNORMAL LOW (ref 13.0–17.0)
O2 Saturation: 97 %
Patient temperature: 97.9
Potassium: 3.6 mmol/L (ref 3.5–5.1)
Sodium: 139 mmol/L (ref 135–145)
TCO2: 35 mmol/L — ABNORMAL HIGH (ref 22–32)
pCO2 arterial: 58.6 mmHg — ABNORMAL HIGH (ref 32–48)
pH, Arterial: 7.357 (ref 7.35–7.45)
pO2, Arterial: 96 mmHg (ref 83–108)

## 2024-04-16 LAB — GLUCOSE, CAPILLARY
Glucose-Capillary: 106 mg/dL — ABNORMAL HIGH (ref 70–99)
Glucose-Capillary: 109 mg/dL — ABNORMAL HIGH (ref 70–99)
Glucose-Capillary: 110 mg/dL — ABNORMAL HIGH (ref 70–99)
Glucose-Capillary: 111 mg/dL — ABNORMAL HIGH (ref 70–99)
Glucose-Capillary: 140 mg/dL — ABNORMAL HIGH (ref 70–99)
Glucose-Capillary: 92 mg/dL (ref 70–99)
Glucose-Capillary: 95 mg/dL (ref 70–99)

## 2024-04-16 MED ORDER — FREE WATER
100.0000 mL | Status: DC
  Administered 2024-04-16 – 2024-04-17 (×6): 100 mL

## 2024-04-16 MED ORDER — AMLODIPINE BESYLATE 10 MG PO TABS
10.0000 mg | ORAL_TABLET | Freq: Every day | ORAL | Status: DC
  Administered 2024-04-17 – 2024-06-21 (×65): 10 mg
  Filled 2024-04-16 (×65): qty 1

## 2024-04-16 NOTE — Progress Notes (Signed)
 Nutrition Follow-up  DOCUMENTATION CODES:  Not applicable  INTERVENTION:  Continue bolus tube feeding via PEG: 5 cartons ( ) Jevity 1.5 daily 60ml water  flush before and after each bolus ( total) 60ml ProSource TF20 TID Provides 2015 kcal, 136g protein, total free water  (TF+FWF)  Recommend decreasing free water  flushes given improved sodium, discussed with NP who adjusted  Given nutrition stability, RD will check in monthly to ensure tube feeding regimen remains appropriate and there are no new tolerance issues. Please place RD consult if any nutrition related issues should arise.   NUTRITION DIAGNOSIS:  Inadequate oral intake related to inability to eat as evidenced by NPO status. - applicable; receiving enteral nutrition  GOAL:  Patient will meet greater than or equal to 90% of their needs - goal met via enteral nutrition  MONITOR:  Labs, Weight trends, TF tolerance, Skin  REASON FOR ASSESSMENT:  Consult Enteral/tube feeding initiation and management  ASSESSMENT:  Pt with PMH of HTN, daily mariajuana, and smoker admitted after being found down at home with pontine hemorrhagic stroke and trace SAH. UDS positive for opioids. Noted aspiration PNA on admission.  3/08 - admitted with [facility], intubated 3/14 - s/p cortrak placement (tip gastric) 3/22 - s/p tracheostomy 3/24 - s/p PEG 4/20 - Cuff changed to cuffless 5/10 - PEG dislodged, TF's stopped  5/11 - PEG replaced, TF's restarted at goal  6/10 - trach removed 7/07- desat, transferred back to 4N ICU, re-intubated  7/16 - s/p trach placement   8/12 - off vent for >48 hours  8/20 - back to ICU on vent d/t tachypnea + hypoxia  Started TC over night. Pt was hypercarbic on repeat gas but remained compensated.  Now plan for TC during day and night vent.   Continues with bolus tube feeding. Tolerating well. No reported nausea, vomiting, diarrhea. No new sick issues reported.   Consistent weight trends  overall.  Noted a 6 kg increase in weight between 8/25-8/26. It is unlikely pt has had this much true weight gain within 1 day given prior stability in weights. Noted presence of non-pitting generalized, BUE and BLE edema per RN documentation which is more likely cause for noted increase.   Medications: SSI 0-15 units q4h, MVI  Labs:  CBG's 92-140 x24 hours  Diet Order:   Diet Order             Diet NPO time specified  Diet effective now                   EDUCATION NEEDS:   No education needs have been identified at this time  Skin:  Skin Assessment: Reviewed RN Assessment Skin Integrity Issues:: Other (Comment) Stage II: Stage 2, perineum Other: Non pressure wound, left eye  Last BM:  8/27 type 6 large  Height:   Ht Readings from Last 1 Encounters:  03/19/24 5' 8 (1.727 m)    Weight:   Wt Readings from Last 1 Encounters:  04/16/24 135 kg    Ideal Body Weight:  70 kg  BMI:  Body mass index is 45.25 kg/m.  Estimated Nutritional Needs:   Kcal:  2000-2200  Protein:  125-140 grams  Fluid:  >2 L/day  Royce Maris, RDN, LDN Clinical Nutrition See AMiON for contact information.

## 2024-04-16 NOTE — Progress Notes (Signed)
 NAME:  Garrett [name], MRN:  [id number], DOB:  26-Jul-1976, LOS: 172 ADMISSION DATE:  10/27/2023, CONSULTATION DATE:  10/27/2023 REFERRING MD: Regalado - TRH, CHIEF COMPLAINT:  Found down   History of Present Illness:  48 y/o man who was found down at home. PMHx significant for HTN.  Patient was down for an unknown amount of time, found by his fiance when she came home from work, reportedly with agonal breathing. Patient had driven her to work the morning of admission.  He has HTN but never checks his BP and does not follow with MD. Patient's fiance states that she can tell when his BP is really high; his eyes get blood shot and he is sweating. No h/o seizures. Besides almost daily marijuana and cigarette smoking, she is not aware of any other drug use. Drug screen positive for opioids and he was given several doses of Narcan . 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 BP in ED 262/156. CXR revealed RUL and perihilar infiltrates suggesting aspiration pneumonia. ED labs also revealed PH 7.169, LA 4.4, CPK 985. Patient was intubated in ED.  PCCM consulted.  Pertinent Medical History:  Hypertension  Significant Hospital Events: Including procedures, antibiotic start and stop dates in addition to other pertinent events   3/08 - Intubated in ED, pontine bleed/SAH. CT head 17:09 Acute large hemorrhage 4.1 cm in pons with intraventricular extension without hydrocephalus 3/09 - CTA 03:14 AM No change in IPH in pons, similar biparietal [facility] 3/14 - Now awake, appears locked in can follow commands with eyes. 3/16 - Purposeful with left upper extremity  3/22 - Tracheostomy placed at bedside, bleeding issues overnight from trach site 3/24 - PEG (Dr. Sebastian) 3/24 - Sputum culture with MRSA 3/27  - Vomited. TF on hold. Abd film c/w ileus. Added reglan , got SSE. Did tolerate PSV all day  3/28 - BMs x2 after SSE day prior. Added back TFs at 1/2 rated. Tolerated PS 3/29 - Tolerated PS  3/31 - Tolerating CPAP PS 15/5, TF on hold due to ileus. 4/01 - Con't to hold tube feeds today, restarting vancomycin  and sending tracheal aspirate as peaks are 37, plat 24, driving 19. Thick secretions. Fever overnight.  4/02 - Peaks improved. Ongoing hiccups. Trickle feeding. Neuro exam unchanged.  4/20 - Trach was changed to cuffless #8 4/28 - Not safe to swallow. Limited communication and he follows simple commands. On TF 5/01 - On TC 28%, with large secretions. Working with speech and he is improving to initiate PMV trials under ST supervision   5/05 - On TC 30%, 7 L/min. Trach #6 cuffless, changed 5/1 to facilitate PMV trials  5/12 - On TC 21%, 6 L/min. Trach #6 cuffless, unable to produce sounds or speak on PMV. No significant resp secretions. On PEG TF 6/10 - Tracheostomy was removed due to failure to well provide adequate airway 02/15/2024 pulmonary critical care consult. 7/07 Patient obtunded and desaturating on NRB; transfer to icu for intubation; updated mother over phone who wants to reverse code status to full code; ent consulted 7/09 Remains intubated on no continuous sedation, right eye open but does not track movement  7/16 Revision ENT trach with [facility] Flap 7/18 Weaned on vent 5 hours  7/19 Tolerated trach collar for the majority of the day but had some increased work of breath  resulting in being placed back on vent support  7/20 No issues overnight, back on ATC this AM 7/21 Did not tolerate ATC. Placed back on PRVC. 7/24 ATC until afternoon and went on vent for WOB reportedly  7/25 ATC for a few hours  7/26 ATC x 6h 7/28 tolerating trach collar-tolerated for most of the day 7/30 on vent overnight and back on trach collar today 8/1 back on vent overnight  8/2 was able to maintain off of  ventilator overnight but by 6 AM today patient was seen with signs of increased work of breathing including tachycardia therefore patient was placed back on ventilator 8/3 remained on ventilator overnight to transition to trach collar this a.m. 8/4 did 18 hour ATC yesterday; back on vent 3am today 8/5 TC for 12-14 hours. Then went back to vent because of tachypnea.  8/6 tolerated ATC at 30% for 24 hours. Back on vent this AM for rest as was a bit tachypneic 8/11 been off vent >24 hours. 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  8/12-off vent for over 48 hours 8/18 completed abx  8/19 Changed to cuffless trach 8/20 Increasing tachypnea, hypercarbia.  Trach changed back to #6 cuffed and brought back to ICU to be placed on ventilator.  Antibiotics broadened to linezolid  and cefepime  8/23 PSV wean 8/24 eeg for twitching no sz 8/26 started TC 8/27 did TC overnight, hypercarbic on repeat gas but compensated  Interim History / Subjective:   Did TC overnight gas with hypercarbia albeit compensated, increasing amlodipine     Objective:   Blood pressure (!) 147/107, pulse 86, temperature 97.9 F (36.6 C), temperature source Axillary, resp. rate (!) 26, height 5' 8 (1.727 m), weight 135 kg, SpO2 100%.    Vent Mode: PRVC FiO2 (%):  [40 %] 40 % Set Rate:  [12 bmp] 12 bmp Vt Set:  [540 mL] 540 mL PEEP:  [5 cmH20] 5 cmH20 Plateau Pressure:  [9 cmH20] 9 cmH20   Intake/Output Summary (Last 24 hours) at 04/16/2024 1013 Last data filed at 04/16/2024 9170 Gross per 24 hour  Intake 1437 ml  Output 1075 ml  Net 362 ml   Filed Weights   04/14/24 0500 04/15/24 0600 04/16/24 0500  Weight: 130 kg 136 kg 135 kg   Physical Examination: General: chronically ill appearing on mech vent HEENT: MM pink/moist; ETT in place Neuro: unresponsive; cough/gag reflex present CV: s1s2, RRR, no m/r/g PULM:  dim clear BS bilaterally;  trach present GI: soft, bsx4  active  Extremities: warm/dry  Resolved Problem List:  Post sedation hypotension Left eye conjunctivitis - resolved  Hyperglycemia, resolved Pseudomonas and E. 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  He is now day 33 postop for his revision.  He is not a candidate for decannulation given ineffective airway clearance Now with worsening respiratory distress, hypercarbia with concern for new HAP Plan: -TC during day, PRVC at night given hypercarbia, suspect is now night time vent dependent -Wean PEEP/FiO2 for SpO2 >92% -VAP bundle in place -trach care per protocol -cefepime  completed; follow trach aspirate  Exposure keratopathy -Ophtho Dr. Waylan consulted on 8/11 and placed tarsorraphy on 8/11.   Plan: -cont erythromycin  eye drops per Optho  HTN Plan: -lopressor , increase norvasc  8/27, PRN hydral    Best Practice (right click and Reselect all SmartList Selections daily)   Diet/type: tubefeeds DVT prophylaxis LMWH Pressure ulcer(s): N/A GI prophylaxis: H2B Lines: N/A Foley:  Yes, and it is still needed Code Status:  full code Last date of multidisciplinary goals of care discussion [8/26 updated mother [place] over phone]  Critical care time:   CRITICAL CARE Performed by: Donnice SAUNDERS Joshawa Dubin   Total critical care time: 32 minutes  Critical care time was exclusive of separately billable procedures and treating other patients.  Critical care was necessary to treat or prevent imminent or life-threatening deterioration.  Critical care was time spent  personally by me on the following activities: development of treatment plan with patient and/or surrogate as well as nursing, discussions with consultants, evaluation of patient's response to treatment, examination of patient, obtaining history from patient or surrogate, ordering and performing treatments and interventions, ordering and review of laboratory studies, ordering and review of radiographic studies, pulse oximetry and re-evaluation of patient's condition.   Donnice SAUNDERS Beals, MD [place] [facility] 04/16/2024, 10:13 AM  Please see [email or web address] for pager details.  From 7A-7P if no response, please call [phone]. After hours, please call ELink [phone].

## 2024-04-16 NOTE — Progress Notes (Signed)
 PHARMACY PROGRESS NOTE  Discussed with CCM attending elevated blood pressures, agreed to increase amlodipine  to 10 mg daily.   Maurilio Patten, PharmD PGY1 Pharmacy Resident [facility] 04/16/2024 9:38 AM

## 2024-04-16 NOTE — Plan of Care (Signed)
  Problem: Nutrition: Goal: Dietary intake will improve Outcome: Progressing   Problem: Fluid Volume: Goal: Ability to maintain a balanced intake and output will improve Outcome: Progressing   Problem: Skin Integrity: Goal: Risk for impaired skin integrity will decrease Outcome: Progressing   Problem: Tissue Perfusion: Goal: Adequacy of tissue perfusion will improve Outcome: Progressing   Problem: Clinical Measurements: Goal: Ability to maintain clinical measurements within normal limits will improve Outcome: Progressing   Problem: Clinical Measurements: Goal: Will remain free from infection Outcome: Progressing   Problem: Clinical Measurements: Goal: Diagnostic test results will improve Outcome: Progressing   Problem: Clinical Measurements: Goal: Respiratory complications will improve Outcome: Progressing   Problem: Clinical Measurements: Goal: Cardiovascular complication will be avoided Outcome: Progressing   Problem: Nutrition: Goal: Adequate nutrition will be maintained Outcome: Progressing    Plan of care, monitoring, assessment, treatment and intervention (s) ongoing,  see MAR  see flowsheet

## 2024-04-17 DIAGNOSIS — I619 Nontraumatic intracerebral hemorrhage, unspecified: Secondary | ICD-10-CM | POA: Diagnosis not present

## 2024-04-17 DIAGNOSIS — J9622 Acute and chronic respiratory failure with hypercapnia: Secondary | ICD-10-CM | POA: Diagnosis not present

## 2024-04-17 DIAGNOSIS — J9621 Acute and chronic respiratory failure with hypoxia: Secondary | ICD-10-CM | POA: Diagnosis not present

## 2024-04-17 DIAGNOSIS — Z930 Tracheostomy status: Secondary | ICD-10-CM | POA: Diagnosis not present

## 2024-04-17 LAB — GLUCOSE, CAPILLARY
Glucose-Capillary: 115 mg/dL — ABNORMAL HIGH (ref 70–99)
Glucose-Capillary: 95 mg/dL (ref 70–99)
Glucose-Capillary: 130 mg/dL — ABNORMAL HIGH (ref 70–99)
Glucose-Capillary: 128 mg/dL — ABNORMAL HIGH (ref 70–99)
Glucose-Capillary: 86 mg/dL (ref 70–99)
Glucose-Capillary: 127 mg/dL — ABNORMAL HIGH (ref 70–99)
Glucose-Capillary: 130 mg/dL — ABNORMAL HIGH (ref 70–99)

## 2024-04-17 MED ORDER — FREE WATER
100.0000 mL | Freq: Four times a day (QID) | Status: DC
  Administered 2024-04-17 – 2024-05-10 (×94): 100 mL

## 2024-04-17 NOTE — Progress Notes (Signed)
 NAME:  Garrett [name], MRN:  [id number], DOB:  20-Apr-1976, LOS: 173 ADMISSION DATE:  10/27/2023, CONSULTATION DATE:  10/27/2023 REFERRING MD: Regalado - [facility], CHIEF COMPLAINT:  Found down   History of Present Illness:  48 y/o man who was found down at home. PMHx significant for HTN.  Patient was down for an unknown amount of time, found by his fiance when she came home from work, reportedly with agonal breathing. Patient had driven her to work the morning of admission.  He has HTN but never checks his BP and does not follow with MD. Patient's fiance states that she can tell when his BP is really high; his eyes get blood shot and he is sweating. No h/o seizures. Besides almost daily marijuana and cigarette smoking, she is not aware of any other drug use. Drug screen positive for opioids and he was given several doses of Narcan . 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 BP in ED 262/156. CXR revealed RUL and perihilar infiltrates suggesting aspiration pneumonia. ED labs also revealed PH 7.169, LA 4.4, CPK 985. Patient was intubated in ED.  PCCM consulted.  Pertinent Medical History:  Hypertension  Significant Hospital Events: Including procedures, antibiotic start and stop dates in addition to other pertinent events   3/08 - Intubated in ED, pontine bleed/SAH. CT head 17:09 Acute large hemorrhage 4.1 cm in pons with intraventricular extension without hydrocephalus 3/09 - CTA 03:14 AM No change in IPH in pons, similar biparietal [facility] 3/14 - Now awake, appears locked in can follow commands with eyes. 3/16 - Purposeful with left upper extremity  3/22 - Tracheostomy placed at bedside, bleeding issues overnight from trach site 3/24 - PEG (Dr. Sebastian) 3/24 - Sputum culture with MRSA 3/27  - Vomited. TF on hold. Abd film c/w ileus. Added reglan , got SSE. Did tolerate PSV all day  3/28 - BMs x2 after SSE day prior. Added back TFs at 1/2 rated. Tolerated PS 3/29 - Tolerated PS  3/31 - Tolerating CPAP PS 15/5, TF on hold due to ileus. 4/01 - Con't to hold tube feeds today, restarting vancomycin  and sending tracheal aspirate as peaks are 37, plat 24, driving 19. Thick secretions. Fever overnight.  4/02 - Peaks improved. Ongoing hiccups. Trickle feeding. Neuro exam unchanged.  4/20 - Trach was changed to cuffless #8 4/28 - Not safe to swallow. Limited communication and he follows simple commands. On TF 5/01 - On TC 28%, with large secretions. Working with speech and he is improving to initiate PMV trials under ST supervision   5/05 - On TC 30%, 7 L/min. Trach #6 cuffless, changed 5/1 to facilitate PMV trials  5/12 - On TC 21%, 6 L/min. Trach #6 cuffless, unable to produce sounds or speak on PMV. No significant resp secretions. On PEG TF 6/10 - Tracheostomy was removed due to failure to well provide adequate airway 02/15/2024 pulmonary critical care consult. 7/07 Patient obtunded and desaturating on NRB; transfer to icu for intubation; updated mother over phone who wants to reverse code status to full code; ent consulted 7/09 Remains intubated on no continuous sedation, right eye open but does not track movement  7/16 Revision ENT trach with [facility] Flap 7/18 Weaned on vent 5 hours  7/19 Tolerated trach collar for the majority of the day but had some increased work of breath  resulting in being placed back on vent support  7/20 No issues overnight, back on ATC this AM 7/21 Did not tolerate ATC. Placed back on PRVC. 7/24 ATC until afternoon and went on vent for WOB reportedly  7/25 ATC for a few hours  7/26 ATC x 6h 7/28 tolerating trach collar-tolerated for most of the day 7/30 on vent overnight and back on trach collar today 8/1 back on vent overnight  8/2 was able to maintain off of  ventilator overnight but by 6 AM today patient was seen with signs of increased work of breathing including tachycardia therefore patient was placed back on ventilator 8/3 remained on ventilator overnight to transition to trach collar this a.m. 8/4 did 18 hour ATC yesterday; back on vent 3am today 8/5 TC for 12-14 hours. Then went back to vent because of tachypnea.  8/6 tolerated ATC at 30% for 24 hours. Back on vent this AM for rest as was a bit tachypneic 8/11 been off vent >24 hours. 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  8/12-off vent for over 48 hours 8/18 completed abx  8/19 Changed to cuffless trach 8/20 Increasing tachypnea, hypercarbia.  Trach changed back to #6 cuffed and brought back to ICU to be placed on ventilator.  Antibiotics broadened to linezolid  and cefepime  8/23 PSV wean 8/24 eeg for twitching no sz 8/26 started TC 8/27 did TC overnight, hypercarbic on repeat gas but compensated  Interim History / Subjective:   Did TC overnight gas with hypercarbia albeit compensated, increasing amlodipine     Objective:   Blood pressure 107/85, pulse 77, temperature 98.5 F (36.9 C), temperature source Oral, resp. rate 12, height 5' 8 (1.727 m), weight 130 kg, SpO2 98%.    Vent Mode: PRVC FiO2 (%):  [40 %] 40 % Set Rate:  [12 bmp] 12 bmp Vt Set:  [540 mL] 540 mL PEEP:  [5 cmH20] 5 cmH20 Plateau Pressure:  [17 cmH20] 17 cmH20   Intake/Output Summary (Last 24 hours) at 04/17/2024 0825 Last data filed at 04/17/2024 0758 Gross per 24 hour  Intake 800 ml  Output 1245 ml  Net -445 ml   Filed Weights   04/15/24 0600 04/16/24 0500 04/17/24 0318  Weight: 136 kg 135 kg 130 kg   Physical Examination: General:  chronically ill appearing male in NAD HEENT: MM pink/moist, R pupil 2/r, L w/ opacification, midline 6 shiley cuffed trach Neuro: unresponsive CV: rr, NSR, no murmur PULM:  on trach collar, clear and diminished GI:  soft, bs hypo, foley  Extremities: warm/dry, no LE edema  Skin: no rashes   Afebrile UOP 1.3L/ 24hrs  Last BM 8/26 No labs today> BMET q 48hrs, weekly CBC or sooner if clinically warranted  Resolved Problem List:  Post sedation hypotension Left eye conjunctivitis - resolved  Hyperglycemia, resolved Pseudomonas and E. 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 Not a candidate for decannulation given ineffective airway clearance Now with worsening respiratory distress, hypercarbia with concern for new HAP 8/20 - repeat ABG 8/27 am after being on TC overnight 7.357/ 58.6/  96/ 33 Plan: - cont TC during day with full MV/ PRVC at night, now suspected to be night time vent dependent - Wean PEEP/FiO2 for SpO2 >92% - VAP/ H2B - trach care per protocol - cefepime  completed 8/25, trach asp 8/20 neg.  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   Plan: - cont erythromycin  eye drops per Optho  HTN Plan: - BP improved today after increased norvasc  8/27, cont lopressor , PRN hydral    Best Practice (right click and Reselect all SmartList Selections daily)   Diet/type: tubefeeds- 5x daily with FWF DVT prophylaxis LMWH Pressure ulcer(s): N/A GI prophylaxis: H2B Lines: N/A Foley:  Yes, and it is still needed Code Status:  full code Last date of multidisciplinary goals of care discussion [8/26 updated mother [place] over phone]  Pending 8/28, no family at bedside.    Critical care time: 30 mins       Lyle Pesa, MSN, AG-ACNP-BC [facility] 04/17/2024, 8:25 AM  See Amion for pager If no response to pager , please call [phone] until 7pm After 7:00 pm call Elink  [phone]

## 2024-04-17 NOTE — Progress Notes (Signed)
 Mother updated by phone, no questions at this time, appreciative of ongoing care.

## 2024-04-17 NOTE — Plan of Care (Signed)
 Problem: Respiratory: Goal: Ability to maintain a clear airway and adequate ventilation will improve Outcome: Progressing   Problem: Intracerebral Hemorrhage Tissue Perfusion: Goal: Complications of Intracerebral Hemorrhage will be minimized Outcome: Not Progressing   Problem: Nutrition: Goal: Risk of aspiration will decrease Outcome: Not Progressing Goal: Dietary intake will improve Outcome: Not Progressing   Problem: Fluid Volume: Goal: Ability to maintain a balanced intake and output will improve Outcome: Not Progressing   Problem: Skin Integrity: Goal: Risk for impaired skin integrity will decrease Outcome: Not Progressing   Problem: Tissue Perfusion: Goal: Adequacy of tissue perfusion will improve Outcome: Not Progressing   Problem: Clinical Measurements: Goal: Ability to maintain clinical measurements within normal limits will improve Outcome: Not Progressing Goal: Will remain free from infection Outcome: Not Progressing Goal: Diagnostic test results will improve Outcome: Not Progressing Goal: Respiratory complications will improve Outcome: Not Progressing Goal: Cardiovascular complication will be avoided Outcome: Not Progressing   Problem: Activity: Goal: Risk for activity intolerance will decrease Outcome: Not Progressing   Problem: Nutrition: Goal: Adequate nutrition will be maintained Outcome: Not Progressing   Problem: Elimination: Goal: Will not experience complications related to bowel motility Outcome: Not Progressing Goal: Will not experience complications related to urinary retention Outcome: Not Progressing   Problem: Pain Managment: Goal: General experience of comfort will improve and/or be controlled Outcome: Not Progressing   Problem: Safety: Goal: Ability to remain free from injury will improve Outcome: Not Progressing   Problem: Skin Integrity: Goal: Risk for impaired skin integrity will decrease Outcome: Not Progressing    Problem: Activity: Goal: Ability to tolerate increased activity will improve Outcome: Not Progressing   Problem: Respiratory: Goal: Patent airway maintenance will improve Outcome: Not Progressing   Problem: Activity: Goal: Ability to tolerate increased activity will improve Outcome: Not Progressing   Problem: Role Relationship: Goal: Method of communication will improve Outcome: Not Progressing   Problem: Education: Goal: Knowledge of disease or condition will improve Outcome: Not Progressing   Problem: Intracerebral Hemorrhage Tissue Perfusion: Goal: Complications of Intracerebral Hemorrhage will be minimized Outcome: Not Progressing   Problem: Self-Care: Goal: Ability to participate in self-care as condition permits will improve Outcome: Not Progressing Goal: Verbalization of feelings and concerns over difficulty with self-care will improve Outcome: Not Progressing Goal: Ability to communicate needs accurately will improve Outcome: Not Progressing   Problem: Nutrition: Goal: Risk of aspiration will decrease Outcome: Not Progressing Goal: Dietary intake will improve Outcome: Not Progressing   Problem: Education: Goal: Knowledge of disease or condition will improve Outcome: Not Progressing Goal: Knowledge of secondary prevention will improve (MUST DOCUMENT ALL) Outcome: Not Progressing Goal: Knowledge of patient specific risk factors will improve (DELETE if not current risk factor) Outcome: Not Progressing   Problem: Intracerebral Hemorrhage Tissue Perfusion: Goal: Complications of Intracerebral Hemorrhage will be minimized Outcome: Not Progressing   Problem: Coping: Goal: Will verbalize positive feelings about self Outcome: Not Progressing Goal: Will identify appropriate support needs Outcome: Not Progressing   Problem: Health Behavior/Discharge Planning: Goal: Ability to manage health-related needs will improve Outcome: Not Progressing Goal: Goals will  be collaboratively established with patient/family Outcome: Not Progressing   Problem: Self-Care: Goal: Ability to participate in self-care as condition permits will improve Outcome: Not Progressing Goal: Verbalization of feelings and concerns over difficulty with self-care will improve Outcome: Not Progressing Goal: Ability to communicate needs accurately will improve Outcome: Not Progressing   Problem: Nutrition: Goal: Risk of aspiration will decrease Outcome: Not Progressing Goal: Dietary intake will improve Outcome: Not  Progressing

## 2024-04-17 NOTE — TOC Progression Note (Addendum)
 Transition of Care [facility]) - Progression Note    Patient Details  Name: Garrett [name] MRN: [id number] Date of Birth: January 22, 1976  [facility]) CM/SW Contact  Lendia Dais, [place] Phone Number: 04/17/2024, 12:27 PM  Clinical Narrative:  CSW attempted to reach out to Lorie, disability specialist at the servant center [phone], Ext. [phone]) to inquire about the pt's disability status. CSW left a VM.  Barriers remain in place, CSW will continue to follow.      Expected Discharge Plan: Long Term Nursing Home Barriers to Discharge: Continued Medical Work up, SNF Pending Medicaid, SNF Pending bed offer, SNF Pending payor source - LOG, Inadequate or no insurance (New trach)               Expected Discharge Plan and Services In-house Referral: Clinical Social Work, Hospice / Palliative Care   Post Acute Care Choice: Skilled Nursing Facility Living arrangements for the past 2 months: Single Family Home                                       Social Drivers of Health (SDOH) Interventions SDOH Screenings   Food Insecurity: Patient Unable To Answer (10/29/2023)  Social Connections: Unknown (06/23/2023)   Received from [facility]  Tobacco Use: High Risk (10/31/2023)    Readmission Risk Interventions     No data to display

## 2024-04-18 DIAGNOSIS — Z930 Tracheostomy status: Secondary | ICD-10-CM | POA: Diagnosis not present

## 2024-04-18 DIAGNOSIS — J9622 Acute and chronic respiratory failure with hypercapnia: Secondary | ICD-10-CM | POA: Diagnosis not present

## 2024-04-18 DIAGNOSIS — J9621 Acute and chronic respiratory failure with hypoxia: Secondary | ICD-10-CM | POA: Diagnosis not present

## 2024-04-18 DIAGNOSIS — I619 Nontraumatic intracerebral hemorrhage, unspecified: Secondary | ICD-10-CM | POA: Diagnosis not present

## 2024-04-18 LAB — GLUCOSE, CAPILLARY
Glucose-Capillary: 121 mg/dL — ABNORMAL HIGH (ref 70–99)
Glucose-Capillary: 127 mg/dL — ABNORMAL HIGH (ref 70–99)
Glucose-Capillary: 131 mg/dL — ABNORMAL HIGH (ref 70–99)
Glucose-Capillary: 150 mg/dL — ABNORMAL HIGH (ref 70–99)
Glucose-Capillary: 152 mg/dL — ABNORMAL HIGH (ref 70–99)
Glucose-Capillary: 88 mg/dL (ref 70–99)
Glucose-Capillary: 94 mg/dL (ref 70–99)

## 2024-04-18 LAB — BASIC METABOLIC PANEL WITH GFR
Anion gap: 12 (ref 5–15)
BUN: 27 mg/dL — ABNORMAL HIGH (ref 6–20)
CO2: 30 mmol/L (ref 22–32)
Calcium: 9.7 mg/dL (ref 8.9–10.3)
Chloride: 96 mmol/L — ABNORMAL LOW (ref 98–111)
Creatinine, Ser: 0.57 mg/dL — ABNORMAL LOW (ref 0.61–1.24)
GFR, Estimated: >60 mL/min (ref >60)
Glucose, Bld: 180 mg/dL — ABNORMAL HIGH (ref 70–99)
Potassium: 3.8 mmol/L (ref 3.5–5.1)
Sodium: 138 mmol/L (ref 135–145)

## 2024-04-18 MED ORDER — POLYETHYLENE GLYCOL 3350 17 G PO PACK
17.0000 g | PACK | Freq: Every day | ORAL | Status: DC
  Administered 2024-04-18 – 2024-06-19 (×48): 17 g
  Filled 2024-04-18 (×51): qty 1

## 2024-04-18 NOTE — Progress Notes (Signed)
 NAME:  Garrett [name], MRN:  [id number], DOB:  01/02/1976, LOS: 174 ADMISSION DATE:  10/27/2023, CONSULTATION DATE:  10/27/2023 REFERRING MD: Madelyne - [facility], CHIEF COMPLAINT:  Found down   History of Present Illness:  48 y/o man who was found down at home. PMHx significant for HTN.  Patient was down for an unknown amount of time, found by his fiance when she came home from work, reportedly with agonal breathing. Patient had driven her to work the morning of admission.  He has HTN but never checks his BP and does not follow with MD. Patient's fiance states that she can tell when his BP is really high; his eyes get blood shot and he is sweating. No h/o seizures. Besides almost daily marijuana and cigarette smoking, she is not aware of any other drug use. Drug screen positive for opioids and he was given several doses of Narcan . 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 BP in ED 262/156. CXR revealed RUL and perihilar infiltrates suggesting aspiration pneumonia. ED labs also revealed PH 7.169, LA 4.4, CPK 985. Patient was intubated in ED.  PCCM consulted.  Pertinent Medical History:  Hypertension  Significant Hospital Events: Including procedures, antibiotic start and stop dates in addition to other pertinent events   3/08 - Intubated in ED, pontine bleed/SAH. CT head 17:09 Acute large hemorrhage 4.1 cm in pons with intraventricular extension without hydrocephalus 3/09 - CTA 03:14 AM No change in IPH in pons, similar biparietal [facility] 3/14 - Now awake, appears locked in can follow commands with eyes. 3/16 - Purposeful with left upper extremity  3/22 - Tracheostomy placed at bedside, bleeding issues overnight from trach site 3/24 - PEG (Dr. Sebastian) 3/24 - Sputum culture with MRSA 3/27  - Vomited. TF on hold. Abd film c/w ileus. Added reglan , got SSE. Did tolerate PSV all day  3/28 - BMs x2 after SSE day prior. Added back TFs at 1/2 rated. Tolerated PS 3/29 - Tolerated PS  3/31 - Tolerating CPAP PS 15/5, TF on hold due to ileus. 4/01 - Con't to hold tube feeds today, restarting vancomycin  and sending tracheal aspirate as peaks are 37, plat 24, driving 19. Thick secretions. Fever overnight.  4/02 - Peaks improved. Ongoing hiccups. Trickle feeding. Neuro exam unchanged.  4/20 - Trach was changed to cuffless #8 4/28 - Not safe to swallow. Limited communication and he follows simple commands. On TF 5/01 - On TC 28%, with large secretions. Working with speech and he is improving to initiate PMV trials under ST supervision   5/05 - On TC 30%, 7 L/min. Trach #6 cuffless, changed 5/1 to facilitate PMV trials  5/12 - On TC 21%, 6 L/min. Trach #6 cuffless, unable to produce sounds or speak on PMV. No significant resp secretions. On PEG TF 6/10 - Tracheostomy was removed due to failure to well provide adequate airway 02/15/2024 pulmonary critical care consult. 7/07 Patient obtunded and desaturating on NRB; transfer to icu for intubation; updated mother over phone who wants to reverse code status to full code; ent consulted 7/09 Remains intubated on no continuous sedation, right eye open but does not track movement  7/16 Revision ENT trach with [facility] Flap 7/18 Weaned on vent 5 hours  7/19 Tolerated trach collar for the majority of the day but had some increased work of breath  resulting in being placed back on vent support  7/20 No issues overnight, back on ATC this AM 7/21 Did not tolerate ATC. Placed back on PRVC. 7/24 ATC until afternoon and went on vent for WOB reportedly  7/25 ATC for a few hours  7/26 ATC x 6h 7/28 tolerating trach collar-tolerated for most of the day 7/30 on vent overnight and back on trach collar today 8/1 back on vent overnight  8/2 was able to maintain off of  ventilator overnight but by 6 AM today patient was seen with signs of increased work of breathing including tachycardia therefore patient was placed back on ventilator 8/3 remained on ventilator overnight to transition to trach collar this a.m. 8/4 did 18 hour ATC yesterday; back on vent 3am today 8/5 TC for 12-14 hours. Then went back to vent because of tachypnea.  8/6 tolerated ATC at 30% for 24 hours. Back on vent this AM for rest as was a bit tachypneic 8/11 been off vent >24 hours. 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  8/12-off vent for over 48 hours 8/18 completed abx  8/19 Changed to cuffless trach 8/20 Increasing tachypnea, hypercarbia.  Trach changed back to #6 cuffed and brought back to ICU to be placed on ventilator.  Antibiotics broadened to linezolid  and cefepime  8/23 PSV wean 8/24 eeg for twitching no sz 8/26 started TC 8/27 did TC overnight, hypercarbic on repeat gas but compensated  Interim History / Subjective:  No acute events overnight  Objective:   Blood pressure (!) 122/92, pulse 85, temperature 98.7 F (37.1 C), temperature source Axillary, resp. rate 12, height 5' 8 (1.727 m), weight 130 kg, SpO2 99%.    Vent Mode: AC FiO2 (%):  [30 %-35 %] 30 % Set Rate:  [12 bmp] 12 bmp Vt Set:  [540 mL] 540 mL PEEP:  [5 cmH20] 5 cmH20 Plateau Pressure:  [18 cmH20] 18 cmH20   Intake/Output Summary (Last 24 hours) at 04/18/2024 0956 Last data filed at 04/18/2024 9370 Gross per 24 hour  Intake 640 ml  Output 1225 ml  Net -585 ml   Filed Weights   04/15/24 0600 04/16/24 0500 04/17/24 0318  Weight: 136 kg 135 kg 130 kg   Physical Examination: General:  chronically ill appearing adult male in NAD HEENT: MM pink/moist, R pupil 3/r, roving, left w/ opacification Neuro: eyes currently open, does not follow commands or move spont CV: rr, NSR, no murmur PULM:  on trach collar, no resp distress, mild tachypnea, coarse, no  wheeze, some pale yellow expectorated secretions GI: soft, bs hypo, foley- dk yellow Extremities: warm/dry, no pitting LE edema  Skin: no rashes   Afebrile UOP 1.375L/ 24hrs  Last BM 8/26 BMET reviewed> Na 138, K 3.8, sCr 0.57  Resolved Problem List:  Post sedation hypotension Left eye conjunctivitis - resolved  Hyperglycemia, resolved Pseudomonas and E. 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 Not a candidate for decannulation given ineffective airway clearance Now with worsening respiratory distress, hypercarbia with concern for new HAP 8/20 - repeat ABG 8/27 am after  24hrs of TC> 7.357/ 58.6/ 96/ 33 cefepime  completed 8/25, trach asp 8/20 neg. 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   Plan: - cont erythromycin  eye drops q2 per Optho.  Recs to tape eyes with paper tape if remain open without blinking and to notify optho if worsening redness or opacification of right eye  HTN Plan: - cont norvasc , cont lopressor , PRN hydral   Will transfer to [facility] 8/30, PCCM will see for trach/ chronic vent needs again 9/2.  Please notify if any concerning changes    Best Practice (right click and Reselect all SmartList Selections daily)   Diet/type: tubefeeds- 5x daily  with FWF DVT prophylaxis LMWH Pressure ulcer(s): N/A GI prophylaxis: H2B Lines: N/A Foley:  Yes, and it is still needed Code Status:  full code Last date of multidisciplinary goals of care discussion [8/26 updated mother [place] over phone]   Mother updated 8/28.   Critical care time: 30 mins      Lyle Pesa, MSN, AG-ACNP-BC [facility] 04/18/2024, 11:14 AM  See Amion for pager If no response to pager , please call [phone] until 7pm After 7:00 pm call Elink  [phone]     Lyle Pesa, MSN, AG-ACNP-BC [facility] 04/18/2024, 9:56 AM  See Amion for pager If no response to pager , please call [phone] until 7pm After 7:00 pm call Elink  [phone]

## 2024-04-18 NOTE — Final Progress Note (Signed)
 OPHTHALMOLOGY CONSULT NOTE  Date: 8/29/25Time: 8:03 AM  Patient Name: Garrett [name]  DOB: Aug 06, 1976 MRN: [id number]  Reason for Consult: Exposure keratopathy  HPI:  This is a 48 y.o. 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 The patient was initially seen by Dr. Loreli 4 months ago and at that time had clear corneas but exposure keratitis only. I was consulted 8/25 at which time the patient was noted to have diffuse corneal scarring in the left eye the right eye has only demonstrated mild punctate erosions. Despite severe epithelial irregularity no significant epithelial defect was noted on initial exam and changes were felt to be due to chronic exposure. Despite this, the patient was started on q1h Moxifloxacin and a temporal tarsorrhaphy was placed for about 3 weeks. During that time so significant change occurred in the stromal opacification, and epithelium remained intact. The patient has been out of tarsorrhaphy for 4 days with strict instructions to nursing regarding lid taping if either eye remains open and q2h ointment.   Prior to Admission medications   Medication Sig Start Date End Date Taking? Authorizing Provider  acetaminophen  (TYLENOL ) 500 MG tablet Take 1,000 mg by mouth every 6 (six) hours as needed for mild pain (pain score 1-3) or headache.   Yes [provider]  ibuprofen (ADVIL) 200 MG tablet Take 600 mg by mouth every 6 (six) hours as needed for mild pain (pain score 1-3).   Yes [provider]    History reviewed. No pertinent past medical history.  family history is not on file.  Social History   Occupational History   Not on file  Tobacco Use   Smoking status: Every Day    Current packs/day: 0.50    Average packs/day: 0.5 packs/day for 30.7 years (15.3 ttl pk-yrs)    Types: Cigarettes    Start date: 1995   Smokeless tobacco: Never  Substance and Sexual  Activity   Alcohol  use: Not Currently   Drug use: Never   Sexual activity: Not on file    Allergies  Allergen Reactions   Tomato Hives, Itching and Rash    ROS: Positive as above, otherwise negative.  EXAM:  Mental Status: Responds to stimulus  Base Exam: Right Eye Left Eye  Visual Acuity (At near) Unable Unable  IOP (Tonopen) 12 14  Pupillary Exam Minimal reaction  Minimal reaction  Motility Roving eye movents Roving eye movments  Confrontation VF Unable Unable   Anterior Segment Exam    Lids/Lashes WNL WNL  Conjuctiva 1+ injection 1+ injection  Cornea Mild inferior PEE Central  opacification without epithelial defect, mild inferior PEE, Clear view through  superior and inferior cornea Clinically opacification appears to be scarring without infiltrate.   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 Will continue q2h erythromycin  ointment long term   -Care instructions were printed by me and hung at bedside. -Primary team to  assess for any worsening of patients clinical condition and ophthalmology made immediately aware if increased concern particularly if any worsening redness or development of any opacification of the right eye. -Given the long term nature of the patients care please consult new ophthalmology team on call should future concerns arise.   Please call with any questions.  Adine Haddock MD [facility] Ophthalmology [phone]

## 2024-04-19 DIAGNOSIS — I619 Nontraumatic intracerebral hemorrhage, unspecified: Secondary | ICD-10-CM | POA: Diagnosis not present

## 2024-04-19 LAB — GLUCOSE, CAPILLARY
Glucose-Capillary: 81 mg/dL (ref 70–99)
Glucose-Capillary: 111 mg/dL — ABNORMAL HIGH (ref 70–99)
Glucose-Capillary: 138 mg/dL — ABNORMAL HIGH (ref 70–99)
Glucose-Capillary: 87 mg/dL (ref 70–99)
Glucose-Capillary: 122 mg/dL — ABNORMAL HIGH (ref 70–99)

## 2024-04-19 NOTE — Progress Notes (Signed)
 PROGRESS NOTE    Garrett [name]  FMW:[id number] DOB: Mar 18, 1976 DOA: 10/27/2023 PCP: Pcp, No   Brief Narrative:  48 y/o man who was found down at home. PMHx significant for HTN.   Patient was down for an unknown amount of time, found by his fiance when she came home from work, reportedly with agonal breathing. Patient had driven her to work the morning of admission.  He has HTN but never checks his BP and does not follow with MD. Patient's fiance states that she can tell when his BP is really high; his eyes get blood shot and he is sweating. No h/o seizures. Besides almost daily marijuana and cigarette smoking, she is not aware of any other drug use. Drug screen positive for opioids and he was given several doses of Narcan . 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 BP in ED 262/156. CXR revealed RUL and perihilar infiltrates suggesting aspiration pneumonia. ED labs also revealed PH 7.169, LA 4.4, CPK 985. Patient was intubated in ED.   PCCM consulted.   Pertinent Medical History:  Hypertension   Significant Hospital Events: Including procedures, antibiotic start and stop dates in addition to other pertinent events   3/08 - Intubated in ED, pontine bleed/SAH. CT head 17:09 Acute large hemorrhage 4.1 cm in pons with intraventricular extension without hydrocephalus 3/09 - CTA 03:14 AM No change in IPH in pons, similar biparietal [facility] 3/14 - Now awake, appears locked in can follow commands with eyes. 3/16 - Purposeful with left upper extremity  3/22 - Tracheostomy placed at bedside, bleeding issues overnight from trach site 3/24 - PEG (Dr. Sebastian) 3/24 - Sputum culture with MRSA 3/27 - Vomited. TF on hold. Abd film c/w ileus. Added reglan , got SSE. Did tolerate PSV all day   3/28 - BMs x2 after SSE day prior. Added back TFs at 1/2 rated. Tolerated PS 3/29 - Tolerated PS  3/31 - Tolerating CPAP PS 15/5, TF on hold due to ileus. 4/01 - Con't to hold tube feeds today, restarting vancomycin  and sending tracheal aspirate as peaks are 37, plat 24, driving 19. Thick secretions. Fever overnight.  4/02 - Peaks improved. Ongoing hiccups. Trickle feeding. Neuro exam unchanged.  4/20 - Trach was changed to cuffless #8 4/28 - Not safe to swallow. Limited communication and he follows simple commands. On TF 5/01 - On TC 28%, with large secretions. Working with speech and he is improving to initiate PMV trials under ST supervision   5/05 - On TC 30%, 7 L/min. Trach #6 cuffless, changed 5/1 to facilitate PMV trials  5/12 - On TC 21%, 6 L/min. Trach #6 cuffless, unable to produce sounds or speak on PMV. No significant resp secretions. On PEG TF 6/10 - Tracheostomy was removed due to failure to well provide adequate airway 02/15/2024 pulmonary critical care consult. 7/07 Patient obtunded and desaturating on NRB; transfer to icu for intubation; updated mother over phone who wants to reverse code status to full code; ent consulted 7/09 Remains intubated on no continuous sedation, right eye open but does not track movement  7/16 Revision ENT trach with [facility] Flap 7/18 Weaned on vent 5 hours  7/19 Tolerated trach collar for the majority of the day but had some increased work of breath resulting in being placed back on vent support  7/20 No issues overnight,  back on ATC this AM 7/21 Did not tolerate ATC. Placed back on PRVC. 7/24 ATC until afternoon and went on vent for WOB reportedly  7/25 ATC for a few hours  7/26 ATC x 6h 7/28 tolerating trach collar-tolerated for most of the day 7/30 on vent overnight and back on trach collar today 8/1 back on vent overnight  8/2 was able to maintain off of ventilator overnight but by 6 AM today patient was seen with signs of increased work of  breathing including tachycardia therefore patient was placed back on ventilator 8/3 remained on ventilator overnight to transition to trach collar this a.m. 8/4 did 18 hour ATC yesterday; back on vent 3am today 8/5 TC for 12-14 hours. Then went back to vent because of tachypnea.  8/6 tolerated ATC at 30% for 24 hours. Back on vent this AM for rest as was a bit tachypneic 8/11 been off vent >24 hours. 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  8/12-off vent for over 48 hours 8/18 completed abx  8/19 Changed to cuffless trach 8/20 Increasing tachypnea, hypercarbia.  Trach changed back to #6 cuffed and brought back to ICU to be placed on ventilator.  Antibiotics broadened to linezolid  and cefepime  8/23 PSV wean 8/24 eeg for twitching no sz 8/26 started TC 8/27 did TC overnight, hypercarbic on repeat gas but compensated 04/18/2024: Last seen by ophthalmology. 04/19/2024: Transferred under [facility].  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 Not a candidate for decannulation given ineffective airway clearance Now with worsening respiratory distress, hypercarbia with concern for new HAP 8/20 -  repeat ABG 8/27 am after 24hrs of TC> 7.357/ 58.6/ 96/ 33 cefepime  completed 8/25, trach asp 8/20 neg. Plan: - cont TC during day with full MV/ PRVC at night, now suspected to be night time vent dependent given recurrent compensated hypercarbia - VAP/ H2B - trach care per protocol - monitor clinically off abx, weekly CBC  - cont BD.  All management per PCCM.   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  Reassessed by ophthalmology again on 04/18/2024, recommendations to continue erythromycin  eye drops q2 per Optho.  Recs to tape eyes with paper tape if remain open without blinking and to notify optho if worsening redness or opacification of right eye.  However on my examination, patient appears to have keratopathy of the left eye.  I presume that the lateralization of the eye is just an error in ophthalmology note.   HTN Blood pressure controlled, continue scheduled amlodipine , Lopressor  and as needed hydralazine .   DVT prophylaxis: enoxaparin  (LOVENOX ) injection 40 mg Start: 03/06/24 1000 SCDs Start: 10/27/23 2226   Code Status: Full Code  Family  Communication:  None present at bedside.   Status is: Inpatient Remains inpatient appropriate because: Pending insurance authorization   Estimated body mass index is 43.58 kg/m as calculated from the following:   Height as of this encounter: 5' 8 (1.727 m).   Weight as of this encounter: 130 kg.    Nutritional Assessment: Body mass index is 43.58 kg/m.SABRA Seen by dietician.  I agree with the assessment and plan as outlined below: Nutrition Status: Nutrition Problem: Inadequate oral intake Etiology: inability to eat Signs/Symptoms: NPO status Interventions: Tube feeding, Prostat, MVI, Juven  . Skin Assessment: I have examined the patient's skin and I agree with the wound assessment as performed by the wound care RN as outlined below:    Consultants:  Critical care and  ophthalmology  Procedures:  Above  Antimicrobials:  Anti-infectives (From admission, onward)    Start     Dose/Rate Route Frequency Ordered Stop   04/09/24 2200  linezolid  (ZYVOX ) tablet 600 mg  Status:  Discontinued        600 mg Per Tube Every 12 hours 04/09/24 1132 04/13/24 1332   04/09/24 1700  vancomycin  (VANCOREADY) IVPB 1500 mg/300 mL  Status:  Discontinued        1,500 mg 150 mL/hr over 120 Minutes Intravenous Every 12 hours 04/09/24 1108 04/09/24 1132   04/09/24 1000  linezolid  (ZYVOX ) IVPB 600 mg  Status:  Discontinued        600 mg 300 mL/hr over 60 Minutes Intravenous Every 12 hours 04/09/24 0527 04/09/24 1108   04/01/24 1145  ceFEPIme  (MAXIPIME ) 2 g in sodium chloride  0.9 % 100 mL IVPB        2 g 200 mL/hr over 30 Minutes Intravenous Every 8 hours 04/01/24 1137 04/14/24 2230   02/21/24 1000  vancomycin  (VANCOREADY) IVPB 1250 mg/250 mL        1,250 mg 166.7 mL/hr over 90 Minutes Intravenous Every 12 hours 02/20/24 2158 02/28/24 0130   02/16/24 1445  ceFEPIme  (MAXIPIME ) 2 g in sodium chloride  0.9 % 100 mL IVPB        2 g 200 mL/hr over 30 Minutes Intravenous Every 8 hours 02/16/24 1348 02/27/24 2322   02/16/24 0945  vancomycin  (VANCOREADY) IVPB 1500 mg/300 mL  Status:  Discontinued        1,500 mg 150 mL/hr over 120 Minutes Intravenous Every 12 hours 02/16/24 0848 02/20/24 2158   02/15/24 1215  Ampicillin -Sulbactam (UNASYN ) 3 g in sodium chloride  0.9 % 100 mL IVPB  Status:  Discontinued        3 g 200 mL/hr over 30 Minutes Intravenous Every 6 hours 02/15/24 1125 02/16/24 1226   01/24/24 2000  vancomycin  (VANCOREADY) IVPB 1250 mg/250 mL        1,250 mg 166.7 mL/hr over 90 Minutes Intravenous Every 24 hours 01/24/24 1154 01/27/24 0125   01/22/24 2200  Vancomycin  (VANCOCIN ) 1,250 mg in sodium chloride  0.9 % 250 mL IVPB  Status:  Discontinued        1,250 mg 166.7 mL/hr over 90 Minutes Intravenous Every 24 hours 01/22/24 1013 01/22/24 1020   01/22/24 2000  vancomycin   (VANCOREADY) IVPB 1250 mg/250 mL  Status:  Discontinued        1,250 mg 166.7 mL/hr over 90 Minutes Intravenous Every 24 hours 01/22/24 1020 01/24/24 1154   01/20/24 2000  ceFEPIme  (MAXIPIME ) 2 g in sodium chloride  0.9 % 100 mL IVPB  Status:  Discontinued        2 g 200 mL/hr  over 30 Minutes Intravenous Every 8 hours 01/20/24 1900 01/24/24 1154   01/20/24 2000  Vancomycin  (VANCOCIN ) 1,250 mg in sodium chloride  0.9 % 250 mL IVPB  Status:  Discontinued        1,250 mg 166.7 mL/hr over 90 Minutes Intravenous Every 24 hours 01/20/24 1900 01/22/24 1013   01/03/24 1730  cefTRIAXone  (ROCEPHIN ) 1 g in sodium chloride  0.9 % 100 mL IVPB        1 g 200 mL/hr over 30 Minutes Intravenous Every 24 hours 01/03/24 1235 01/07/24 0710   01/02/24 1830  ceFEPIme  (MAXIPIME ) 2 g in sodium chloride  0.9 % 100 mL IVPB  Status:  Discontinued        2 g 200 mL/hr over 30 Minutes Intravenous Every 8 hours 01/02/24 1732 01/03/24 1235   01/02/24 1830  Vancomycin  (VANCOCIN ) 1,250 mg in sodium chloride  0.9 % 250 mL IVPB  Status:  Discontinued        1,250 mg 166.7 mL/hr over 90 Minutes Intravenous Every 24 hours 01/02/24 1732 01/04/24 1754   01/01/24 1200  cefTRIAXone  (ROCEPHIN ) 1 g in sodium chloride  0.9 % 100 mL IVPB  Status:  Discontinued        1 g 200 mL/hr over 30 Minutes Intravenous Every 24 hours 01/01/24 1057 01/02/24 1713   11/29/23 2200  linezolid  (ZYVOX ) tablet 600 mg        600 mg Per Tube Every 12 hours 11/29/23 1034 12/04/23 2215   11/28/23 1000  linezolid  (ZYVOX ) IVPB 600 mg  Status:  Discontinued        600 mg 300 mL/hr over 60 Minutes Intravenous Every 12 hours 11/28/23 0831 11/29/23 1034   11/23/23 1200  vancomycin  (VANCOREADY) IVPB 1250 mg/250 mL  Status:  Discontinued        1,250 mg 166.7 mL/hr over 90 Minutes Intravenous Every 24 hours 11/23/23 1036 11/28/23 0831   11/20/23 1100  vancomycin  (VANCOREADY) IVPB 1250 mg/250 mL  Status:  Discontinued        1,250 mg 166.7 mL/hr over 90 Minutes  Intravenous Every 24 hours 11/20/23 0923 11/20/23 0923   11/20/23 1100  vancomycin  (VANCOREADY) IVPB 1250 mg/250 mL  Status:  Discontinued        1,250 mg 166.7 mL/hr over 90 Minutes Intravenous Every 24 hours 11/20/23 0923 11/23/23 1036   11/15/23 2300  vancomycin  (VANCOREADY) IVPB 1250 mg/250 mL        1,250 mg 166.7 mL/hr over 90 Minutes Intravenous Every 24 hours 11/15/23 2215 11/19/23 0924   11/12/23 2200  vancomycin  (VANCOREADY) IVPB 750 mg/150 mL  Status:  Discontinued        750 mg 150 mL/hr over 60 Minutes Intravenous Every 12 hours 11/12/23 0855 11/15/23 2215   11/12/23 0930  vancomycin  (VANCOREADY) IVPB 2000 mg/400 mL        2,000 mg 200 mL/hr over 120 Minutes Intravenous  Once 11/12/23 0838 11/12/23 1116   11/11/23 1345  Ampicillin -Sulbactam (UNASYN ) 3 g in sodium chloride  0.9 % 100 mL IVPB  Status:  Discontinued        3 g 200 mL/hr over 30 Minutes Intravenous Every 6 hours 11/11/23 1257 11/12/23 0836   10/28/23 0400  Ampicillin -Sulbactam (UNASYN ) 3 g in sodium chloride  0.9 % 100 mL IVPB  Status:  Discontinued        3 g 200 mL/hr over 30 Minutes Intravenous Every 6 hours 10/27/23 2151 11/02/23 0945   10/27/23 1845  vancomycin  (VANCOCIN ) IVPB 1000 mg/200 mL premix  Status:  Discontinued        1,000 mg 200 mL/hr over 60 Minutes Intravenous  Once 10/27/23 1843 10/27/23 1843   10/27/23 1845  ceFEPIme  (MAXIPIME ) 2 g in sodium chloride  0.9 % 100 mL IVPB        2 g 200 mL/hr over 30 Minutes Intravenous  Once 10/27/23 1843 10/27/23 2024   10/27/23 1845  vancomycin  (VANCOREADY) IVPB 2000 mg/400 mL        2,000 mg 200 mL/hr over 120 Minutes Intravenous  Once 10/27/23 1843 10/27/23 2123         Subjective: Patient seen and examined, patient not responsive to any commands.  Appears comfortable.  Objective: Vitals:   04/19/24 0400 04/19/24 0500 04/19/24 0600 04/19/24 0730  BP: (!) 127/99 112/88 115/88   Pulse: 83 79 81   Resp: 16 16 16    Temp:    99 F (37.2 C)  TempSrc:     Axillary  SpO2: 100% 100% 100%   Weight:      Height:        Intake/Output Summary (Last 24 hours) at 04/19/2024 0816 Last data filed at 04/19/2024 0650 Gross per 24 hour  Intake 1337 ml  Output 1040 ml  Net 297 ml   Filed Weights   04/15/24 0600 04/16/24 0500 04/17/24 0318  Weight: 136 kg 135 kg 130 kg    Examination:  General exam: Appears calm and comfortable  Respiratory system: Has tracheostomy. Cardiovascular system: S1 & S2 heard, RRR. No JVD, murmurs, rubs, gallops or clicks. No pedal edema. Gastrointestinal system: Abdomen is nondistended, soft and nontender. No organomegaly or masses felt. Normal bowel sounds heard. Central nervous system: Not responding to any commands, not oriented,  Data Reviewed: I have personally reviewed following labs and imaging studies  CBC: Recent Labs  Lab 04/14/24 1058 04/16/24 0806  WBC 6.7  --   HGB 9.8* 11.6*  HCT 33.3* 34.0*  MCV 91.7  --   PLT 383  --    Basic Metabolic Panel: Recent Labs  Lab 04/14/24 1058 04/16/24 0806 04/16/24 0812 04/18/24 0806  NA 135 139 135 138  K 3.9 3.6 3.8 3.8  CL 105  --  99 96*  CO2 22  --  28 30  GLUCOSE 112*  --  98 180*  BUN 21*  --  15 27*  CREATININE 0.46*  --  0.44* 0.57*  CALCIUM 9.1  --  9.5 9.7   GFR: Estimated Creatinine Clearance: 150.2 mL/min (A) (by C-G formula based on SCr of 0.57 mg/dL (L)). Liver Function Tests: No results for input(s): AST, ALT, ALKPHOS, BILITOT, PROT, ALBUMIN  in the last 168 hours. No results for input(s): LIPASE, AMYLASE in the last 168 hours. No results for input(s): AMMONIA in the last 168 hours. Coagulation Profile: No results for input(s): INR, PROTIME in the last 168 hours. Cardiac Enzymes: No results for input(s): CKTOTAL, CKMB, CKMBINDEX, TROPONINI in the last 168 hours. BNP (last 3 results) No results for input(s): PROBNP in the last 8760 hours. HbA1C: No results for input(s): HGBA1C in the last 72  hours. CBG: Recent Labs  Lab 04/18/24 1526 04/18/24 1922 04/18/24 2334 04/19/24 0338 04/19/24 0728  GLUCAP 94 131* 150* 81 111*   Lipid Profile: No results for input(s): CHOL, HDL, LDLCALC, TRIG, CHOLHDL, LDLDIRECT in the last 72 hours. Thyroid Function Tests: No results for input(s): TSH, T4TOTAL, FREET4, T3FREE, THYROIDAB in the last 72 hours. Anemia Panel: No results for input(s): VITAMINB12, FOLATE, FERRITIN, TIBC, IRON, RETICCTPCT  in the last 72 hours. Sepsis Labs: No results for input(s): PROCALCITON, LATICACIDVEN in the last 168 hours.  No results found for this or any previous visit (from the past 240 hours).   Radiology Studies: No results found.  Scheduled Meds:  amLODipine   10 mg Per Tube Daily   Chlorhexidine  Gluconate Cloth  6 each Topical Daily   enoxaparin  (LOVENOX ) injection  40 mg Subcutaneous Daily   erythromycin    Both Eyes Q2H   famotidine   20 mg Per Tube BID   feeding supplement (JEVITY 1.5 CAL/FIBER)  237 mL Per Tube 5 X Daily   feeding supplement (PROSource TF20)  60 mL Per Tube TID   free water   100 mL Per Tube Q6H   insulin  aspart  0-15 Units Subcutaneous Q4H   metoprolol  tartrate  75 mg Per Tube BID   multivitamin with minerals  1 tablet Per Tube Daily   mouth rinse  15 mL Mouth Rinse Q2H   polyethylene glycol  17 g Per Tube Daily   sodium chloride  flush  10-40 mL Intracatheter Q12H   Continuous Infusions:   LOS: 175 days   Fredia Skeeter, MD [facility]  04/19/2024, 8:16 AM   *Please note that this is a verbal dictation therefore any spelling or grammatical errors are due to the Dragon Medical One system interpretation.  Please page via Amion and do not message via secure chat for urgent patient care matters. Secure chat can be used for non urgent patient care matters.  How to contact the [facility] Attending or Consulting provider 7A - 7P or covering provider during after hours 7P -7A, for this  patient?  Check the care team in [facility] and look for a) attending/consulting [facility] provider listed and b) the [facility] team listed. Page or secure chat 7A-7P. Log into [email or web address] and use [place]'s universal password to access. If you do not have the password, please contact the hospital operator. Locate the [facility] provider you are looking for under [facility] and page to a number that you can be directly reached. If you still have difficulty reaching the provider, please page the [facility] (Director on Call) for the Hospitalists listed on amion for assistance.

## 2024-04-20 DIAGNOSIS — I619 Nontraumatic intracerebral hemorrhage, unspecified: Secondary | ICD-10-CM | POA: Diagnosis not present

## 2024-04-20 LAB — BASIC METABOLIC PANEL WITH GFR
Anion gap: 12 (ref 5–15)
BUN: 33 mg/dL — ABNORMAL HIGH (ref 6–20)
CO2: 29 mmol/L (ref 22–32)
Calcium: 9.7 mg/dL (ref 8.9–10.3)
Chloride: 99 mmol/L (ref 98–111)
Creatinine, Ser: 0.53 mg/dL — ABNORMAL LOW (ref 0.61–1.24)
GFR, Estimated: >60 mL/min (ref >60)
Glucose, Bld: 93 mg/dL (ref 70–99)
Potassium: 4.3 mmol/L (ref 3.5–5.1)
Sodium: 140 mmol/L (ref 135–145)

## 2024-04-20 LAB — GLUCOSE, CAPILLARY
Glucose-Capillary: 132 mg/dL — ABNORMAL HIGH (ref 70–99)
Glucose-Capillary: 90 mg/dL (ref 70–99)
Glucose-Capillary: 168 mg/dL — ABNORMAL HIGH (ref 70–99)
Glucose-Capillary: 150 mg/dL — ABNORMAL HIGH (ref 70–99)
Glucose-Capillary: 90 mg/dL (ref 70–99)
Glucose-Capillary: 107 mg/dL — ABNORMAL HIGH (ref 70–99)
Glucose-Capillary: 112 mg/dL — ABNORMAL HIGH (ref 70–99)

## 2024-04-20 NOTE — Progress Notes (Signed)
 PROGRESS NOTE    Garrett [name]  FMW:[id number] DOB: August 27, 1975 DOA: 10/27/2023 PCP: Pcp, No   Brief Narrative:  48 y/o man who was found down at home. PMHx significant for HTN.   Patient was down for an unknown amount of time, found by his fiance when she came home from work, reportedly with agonal breathing. Patient had driven her to work the morning of admission.  He has HTN but never checks his BP and does not follow with MD. Patient's fiance states that she can tell when his BP is really high; his eyes get blood shot and he is sweating. No h/o seizures. Besides almost daily marijuana and cigarette smoking, she is not aware of any other drug use. Drug screen positive for opioids and he was given several doses of Narcan . 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 BP in ED 262/156. CXR revealed RUL and perihilar infiltrates suggesting aspiration pneumonia. ED labs also revealed PH 7.169, LA 4.4, CPK 985. Patient was intubated in ED.   PCCM consulted.   Pertinent Medical History:  Hypertension   Significant Hospital Events: Including procedures, antibiotic start and stop dates in addition to other pertinent events   3/08 - Intubated in ED, pontine bleed/SAH. CT head 17:09 Acute large hemorrhage 4.1 cm in pons with intraventricular extension without hydrocephalus 3/09 - CTA 03:14 AM No change in IPH in pons, similar biparietal [facility] 3/14 - Now awake, appears locked in can follow commands with eyes. 3/16 - Purposeful with left upper extremity  3/22 - Tracheostomy placed at bedside, bleeding issues overnight from trach site 3/24 - PEG (Dr. Sebastian) 3/24 - Sputum culture with MRSA 3/27 - Vomited. TF on hold. Abd film c/w ileus. Added reglan , got SSE. Did tolerate PSV all day   3/28 - BMs x2 after SSE day prior. Added back TFs at 1/2 rated. Tolerated PS 3/29 - Tolerated PS  3/31 - Tolerating CPAP PS 15/5, TF on hold due to ileus. 4/01 - Con't to hold tube feeds today, restarting vancomycin  and sending tracheal aspirate as peaks are 37, plat 24, driving 19. Thick secretions. Fever overnight.  4/02 - Peaks improved. Ongoing hiccups. Trickle feeding. Neuro exam unchanged.  4/20 - Trach was changed to cuffless #8 4/28 - Not safe to swallow. Limited communication and he follows simple commands. On TF 5/01 - On TC 28%, with large secretions. Working with speech and he is improving to initiate PMV trials under ST supervision   5/05 - On TC 30%, 7 L/min. Trach #6 cuffless, changed 5/1 to facilitate PMV trials  5/12 - On TC 21%, 6 L/min. Trach #6 cuffless, unable to produce sounds or speak on PMV. No significant resp secretions. On PEG TF 6/10 - Tracheostomy was removed due to failure to well provide adequate airway 02/15/2024 pulmonary critical care consult. 7/07 Patient obtunded and desaturating on NRB; transfer to icu for intubation; updated mother over phone who wants to reverse code status to full code; ent consulted 7/09 Remains intubated on no continuous sedation, right eye open but does not track movement  7/16 Revision ENT trach with [facility] Flap 7/18 Weaned on vent 5 hours  7/19 Tolerated trach collar for the majority of the day but had some increased work of breath resulting in being placed back on vent support  7/20 No issues overnight,  back on ATC this AM 7/21 Did not tolerate ATC. Placed back on PRVC. 7/24 ATC until afternoon and went on vent for WOB reportedly  7/25 ATC for a few hours  7/26 ATC x 6h 7/28 tolerating trach collar-tolerated for most of the day 7/30 on vent overnight and back on trach collar today 8/1 back on vent overnight  8/2 was able to maintain off of ventilator overnight but by 6 AM today patient was seen with signs of increased work of  breathing including tachycardia therefore patient was placed back on ventilator 8/3 remained on ventilator overnight to transition to trach collar this a.m. 8/4 did 18 hour ATC yesterday; back on vent 3am today 8/5 TC for 12-14 hours. Then went back to vent because of tachypnea.  8/6 tolerated ATC at 30% for 24 hours. Back on vent this AM for rest as was a bit tachypneic 8/11 been off vent >24 hours. 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  8/12-off vent for over 48 hours 8/18 completed abx  8/19 Changed to cuffless trach 8/20 Increasing tachypnea, hypercarbia.  Trach changed back to #6 cuffed and brought back to ICU to be placed on ventilator.  Antibiotics broadened to linezolid  and cefepime  8/23 PSV wean 8/24 eeg for twitching no sz 8/26 started TC 8/27 did TC overnight, hypercarbic on repeat gas but compensated 04/18/2024: Last seen by ophthalmology. 04/19/2024: Transferred under [facility].  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 Not a candidate for decannulation given ineffective airway clearance Now with worsening respiratory distress, hypercarbia with concern for new HAP 8/20 -  repeat ABG 8/27 am after 24hrs of TC> 7.357/ 58.6/ 96/ 33 cefepime  completed 8/25, trach asp 8/20 neg. Plan: - cont TC during day with full MV/ PRVC at night, now suspected to be night time vent dependent given recurrent compensated hypercarbia - VAP/ H2B - trach care per protocol - monitor clinically off abx, weekly CBC  - cont BD.  All management per PCCM.   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  Reassessed by ophthalmology again on 04/18/2024, recommendations to continue erythromycin  eye drops q2 per Optho.  Recs to tape eyes with paper tape if remain open without blinking and to notify optho if worsening redness or opacification of right eye.  However on my examination, patient appears to have keratopathy of the left eye.  I presume that the lateralization of the eye is just an error in ophthalmology note.   HTN Blood pressure controlled, continue scheduled amlodipine , Lopressor  and as needed hydralazine .   DVT prophylaxis: enoxaparin  (LOVENOX ) injection 40 mg Start: 03/06/24 1000 SCDs Start: 10/27/23 2226   Code Status: Full Code  Family  Communication:  None present at bedside.   Status is: Inpatient Remains inpatient appropriate because: Pending insurance authorization   Estimated body mass index is 43.58 kg/m as calculated from the following:   Height as of this encounter: 5' 8 (1.727 m).   Weight as of this encounter: 130 kg.    Nutritional Assessment: Body mass index is 43.58 kg/m.SABRA Seen by dietician.  I agree with the assessment and plan as outlined below: Nutrition Status: Nutrition Problem: Inadequate oral intake Etiology: inability to eat Signs/Symptoms: NPO status Interventions: Tube feeding, Prostat, MVI, Juven  . Skin Assessment: I have examined the patient's skin and I agree with the wound assessment as performed by the wound care RN as outlined below:    Consultants:  Critical care and  ophthalmology  Procedures:  Above  Antimicrobials:  Anti-infectives (From admission, onward)    Start     Dose/Rate Route Frequency Ordered Stop   04/09/24 2200  linezolid  (ZYVOX ) tablet 600 mg  Status:  Discontinued        600 mg Per Tube Every 12 hours 04/09/24 1132 04/13/24 1332   04/09/24 1700  vancomycin  (VANCOREADY) IVPB 1500 mg/300 mL  Status:  Discontinued        1,500 mg 150 mL/hr over 120 Minutes Intravenous Every 12 hours 04/09/24 1108 04/09/24 1132   04/09/24 1000  linezolid  (ZYVOX ) IVPB 600 mg  Status:  Discontinued        600 mg 300 mL/hr over 60 Minutes Intravenous Every 12 hours 04/09/24 0527 04/09/24 1108   04/01/24 1145  ceFEPIme  (MAXIPIME ) 2 g in sodium chloride  0.9 % 100 mL IVPB        2 g 200 mL/hr over 30 Minutes Intravenous Every 8 hours 04/01/24 1137 04/14/24 2230   02/21/24 1000  vancomycin  (VANCOREADY) IVPB 1250 mg/250 mL        1,250 mg 166.7 mL/hr over 90 Minutes Intravenous Every 12 hours 02/20/24 2158 02/28/24 0130   02/16/24 1445  ceFEPIme  (MAXIPIME ) 2 g in sodium chloride  0.9 % 100 mL IVPB        2 g 200 mL/hr over 30 Minutes Intravenous Every 8 hours 02/16/24 1348 02/27/24 2322   02/16/24 0945  vancomycin  (VANCOREADY) IVPB 1500 mg/300 mL  Status:  Discontinued        1,500 mg 150 mL/hr over 120 Minutes Intravenous Every 12 hours 02/16/24 0848 02/20/24 2158   02/15/24 1215  Ampicillin -Sulbactam (UNASYN ) 3 g in sodium chloride  0.9 % 100 mL IVPB  Status:  Discontinued        3 g 200 mL/hr over 30 Minutes Intravenous Every 6 hours 02/15/24 1125 02/16/24 1226   01/24/24 2000  vancomycin  (VANCOREADY) IVPB 1250 mg/250 mL        1,250 mg 166.7 mL/hr over 90 Minutes Intravenous Every 24 hours 01/24/24 1154 01/27/24 0125   01/22/24 2200  Vancomycin  (VANCOCIN ) 1,250 mg in sodium chloride  0.9 % 250 mL IVPB  Status:  Discontinued        1,250 mg 166.7 mL/hr over 90 Minutes Intravenous Every 24 hours 01/22/24 1013 01/22/24 1020   01/22/24 2000  vancomycin   (VANCOREADY) IVPB 1250 mg/250 mL  Status:  Discontinued        1,250 mg 166.7 mL/hr over 90 Minutes Intravenous Every 24 hours 01/22/24 1020 01/24/24 1154   01/20/24 2000  ceFEPIme  (MAXIPIME ) 2 g in sodium chloride  0.9 % 100 mL IVPB  Status:  Discontinued        2 g 200 mL/hr  over 30 Minutes Intravenous Every 8 hours 01/20/24 1900 01/24/24 1154   01/20/24 2000  Vancomycin  (VANCOCIN ) 1,250 mg in sodium chloride  0.9 % 250 mL IVPB  Status:  Discontinued        1,250 mg 166.7 mL/hr over 90 Minutes Intravenous Every 24 hours 01/20/24 1900 01/22/24 1013   01/03/24 1730  cefTRIAXone  (ROCEPHIN ) 1 g in sodium chloride  0.9 % 100 mL IVPB        1 g 200 mL/hr over 30 Minutes Intravenous Every 24 hours 01/03/24 1235 01/07/24 0710   01/02/24 1830  ceFEPIme  (MAXIPIME ) 2 g in sodium chloride  0.9 % 100 mL IVPB  Status:  Discontinued        2 g 200 mL/hr over 30 Minutes Intravenous Every 8 hours 01/02/24 1732 01/03/24 1235   01/02/24 1830  Vancomycin  (VANCOCIN ) 1,250 mg in sodium chloride  0.9 % 250 mL IVPB  Status:  Discontinued        1,250 mg 166.7 mL/hr over 90 Minutes Intravenous Every 24 hours 01/02/24 1732 01/04/24 1754   01/01/24 1200  cefTRIAXone  (ROCEPHIN ) 1 g in sodium chloride  0.9 % 100 mL IVPB  Status:  Discontinued        1 g 200 mL/hr over 30 Minutes Intravenous Every 24 hours 01/01/24 1057 01/02/24 1713   11/29/23 2200  linezolid  (ZYVOX ) tablet 600 mg        600 mg Per Tube Every 12 hours 11/29/23 1034 12/04/23 2215   11/28/23 1000  linezolid  (ZYVOX ) IVPB 600 mg  Status:  Discontinued        600 mg 300 mL/hr over 60 Minutes Intravenous Every 12 hours 11/28/23 0831 11/29/23 1034   11/23/23 1200  vancomycin  (VANCOREADY) IVPB 1250 mg/250 mL  Status:  Discontinued        1,250 mg 166.7 mL/hr over 90 Minutes Intravenous Every 24 hours 11/23/23 1036 11/28/23 0831   11/20/23 1100  vancomycin  (VANCOREADY) IVPB 1250 mg/250 mL  Status:  Discontinued        1,250 mg 166.7 mL/hr over 90 Minutes  Intravenous Every 24 hours 11/20/23 0923 11/20/23 0923   11/20/23 1100  vancomycin  (VANCOREADY) IVPB 1250 mg/250 mL  Status:  Discontinued        1,250 mg 166.7 mL/hr over 90 Minutes Intravenous Every 24 hours 11/20/23 0923 11/23/23 1036   11/15/23 2300  vancomycin  (VANCOREADY) IVPB 1250 mg/250 mL        1,250 mg 166.7 mL/hr over 90 Minutes Intravenous Every 24 hours 11/15/23 2215 11/19/23 0924   11/12/23 2200  vancomycin  (VANCOREADY) IVPB 750 mg/150 mL  Status:  Discontinued        750 mg 150 mL/hr over 60 Minutes Intravenous Every 12 hours 11/12/23 0855 11/15/23 2215   11/12/23 0930  vancomycin  (VANCOREADY) IVPB 2000 mg/400 mL        2,000 mg 200 mL/hr over 120 Minutes Intravenous  Once 11/12/23 0838 11/12/23 1116   11/11/23 1345  Ampicillin -Sulbactam (UNASYN ) 3 g in sodium chloride  0.9 % 100 mL IVPB  Status:  Discontinued        3 g 200 mL/hr over 30 Minutes Intravenous Every 6 hours 11/11/23 1257 11/12/23 0836   10/28/23 0400  Ampicillin -Sulbactam (UNASYN ) 3 g in sodium chloride  0.9 % 100 mL IVPB  Status:  Discontinued        3 g 200 mL/hr over 30 Minutes Intravenous Every 6 hours 10/27/23 2151 11/02/23 0945   10/27/23 1845  vancomycin  (VANCOCIN ) IVPB 1000 mg/200 mL premix  Status:  Discontinued        1,000 mg 200 mL/hr over 60 Minutes Intravenous  Once 10/27/23 1843 10/27/23 1843   10/27/23 1845  ceFEPIme  (MAXIPIME ) 2 g in sodium chloride  0.9 % 100 mL IVPB        2 g 200 mL/hr over 30 Minutes Intravenous  Once 10/27/23 1843 10/27/23 2024   10/27/23 1845  vancomycin  (VANCOREADY) IVPB 2000 mg/400 mL        2,000 mg 200 mL/hr over 120 Minutes Intravenous  Once 10/27/23 1843 10/27/23 2123         Subjective: Patient seen and examined.  No response and not following commands.  Opens eyes.  Nurse at the bedside.  Objective: Vitals:   04/20/24 0400 04/20/24 0500 04/20/24 0600 04/20/24 0729  BP: 113/85 107/78 102/78   Pulse: 71 73 73   Resp: 16 16 16    Temp: 99.1 F (37.3 C)    98.5 F (36.9 C)  TempSrc: Axillary   Axillary  SpO2: 100% 98% 100%   Weight:      Height:        Intake/Output Summary (Last 24 hours) at 04/20/2024 0740 Last data filed at 04/20/2024 [id number] Gross per 24 hour  Intake 1585 ml  Output 1000 ml  Net 585 ml   Filed Weights   04/15/24 0600 04/16/24 0500 04/17/24 0318  Weight: 136 kg 135 kg 130 kg    Examination:  General exam: Appears calm and comfortable  Respiratory system: Has tracheostomy. Cardiovascular system: S1 & S2 heard, RRR. No JVD, murmurs, rubs, gallops or clicks. No pedal edema. Gastrointestinal system: Abdomen is nondistended, soft and nontender. No organomegaly or masses felt. Normal bowel sounds heard. Central nervous system: Not responding to any commands, not oriented,  Data Reviewed: I have personally reviewed following labs and imaging studies  CBC: Recent Labs  Lab 04/14/24 1058 04/16/24 0806  WBC 6.7  --   HGB 9.8* 11.6*  HCT 33.3* 34.0*  MCV 91.7  --   PLT 383  --    Basic Metabolic Panel: Recent Labs  Lab 04/14/24 1058 04/16/24 0806 04/16/24 0812 04/18/24 0806  NA 135 139 135 138  K 3.9 3.6 3.8 3.8  CL 105  --  99 96*  CO2 22  --  28 30  GLUCOSE 112*  --  98 180*  BUN 21*  --  15 27*  CREATININE 0.46*  --  0.44* 0.57*  CALCIUM 9.1  --  9.5 9.7   GFR: Estimated Creatinine Clearance: 150.2 mL/min (A) (by C-G formula based on SCr of 0.57 mg/dL (L)). Liver Function Tests: No results for input(s): AST, ALT, ALKPHOS, BILITOT, PROT, ALBUMIN  in the last 168 hours. No results for input(s): LIPASE, AMYLASE in the last 168 hours. No results for input(s): AMMONIA in the last 168 hours. Coagulation Profile: No results for input(s): INR, PROTIME in the last 168 hours. Cardiac Enzymes: No results for input(s): CKTOTAL, CKMB, CKMBINDEX, TROPONINI in the last 168 hours. BNP (last 3 results) No results for input(s): PROBNP in the last 8760 hours. HbA1C: No results  for input(s): HGBA1C in the last 72 hours. CBG: Recent Labs  Lab 04/19/24 1520 04/19/24 2039 04/20/24 0100 04/20/24 0503 04/20/24 0725  GLUCAP 87 122* 132* 90 168*   Lipid Profile: No results for input(s): CHOL, HDL, LDLCALC, TRIG, CHOLHDL, LDLDIRECT in the last 72 hours. Thyroid Function Tests: No results for input(s): TSH, T4TOTAL, FREET4, T3FREE, THYROIDAB in the last 72 hours. Anemia Panel: No results  for input(s): VITAMINB12, FOLATE, FERRITIN, TIBC, IRON, RETICCTPCT in the last 72 hours. Sepsis Labs: No results for input(s): PROCALCITON, LATICACIDVEN in the last 168 hours.  No results found for this or any previous visit (from the past 240 hours).   Radiology Studies: No results found.  Scheduled Meds:  amLODipine   10 mg Per Tube Daily   Chlorhexidine  Gluconate Cloth  6 each Topical Daily   enoxaparin  (LOVENOX ) injection  40 mg Subcutaneous Daily   erythromycin    Both Eyes Q2H   famotidine   20 mg Per Tube BID   feeding supplement (JEVITY 1.5 CAL/FIBER)  237 mL Per Tube 5 X Daily   feeding supplement (PROSource TF20)  60 mL Per Tube TID   free water   100 mL Per Tube Q6H   insulin  aspart  0-15 Units Subcutaneous Q4H   metoprolol  tartrate  75 mg Per Tube BID   multivitamin with minerals  1 tablet Per Tube Daily   mouth rinse  15 mL Mouth Rinse Q2H   polyethylene glycol  17 g Per Tube Daily   sodium chloride  flush  10-40 mL Intracatheter Q12H   Continuous Infusions:   LOS: 176 days   Fredia Skeeter, MD [facility]  04/20/2024, 7:40 AM   *Please note that this is a verbal dictation therefore any spelling or grammatical errors are due to the Dragon Medical One system interpretation.  Please page via Amion and do not message via secure chat for urgent patient care matters. Secure chat can be used for non urgent patient care matters.  How to contact the [facility] Attending or Consulting provider 7A - 7P or covering provider  during after hours 7P -7A, for this patient?  Check the care team in [facility] and look for a) attending/consulting [facility] provider listed and b) the [facility] team listed. Page or secure chat 7A-7P. Log into [email or web address] and use [place]'s universal password to access. If you do not have the password, please contact the hospital operator. Locate the [facility] provider you are looking for under [facility] and page to a number that you can be directly reached. If you still have difficulty reaching the provider, please page the [facility] (Director on Call) for the Hospitalists listed on amion for assistance.

## 2024-04-21 DIAGNOSIS — I619 Nontraumatic intracerebral hemorrhage, unspecified: Secondary | ICD-10-CM | POA: Diagnosis not present

## 2024-04-21 LAB — GLUCOSE, CAPILLARY
Glucose-Capillary: 84 mg/dL (ref 70–99)
Glucose-Capillary: 145 mg/dL — ABNORMAL HIGH (ref 70–99)
Glucose-Capillary: 141 mg/dL — ABNORMAL HIGH (ref 70–99)
Glucose-Capillary: 100 mg/dL — ABNORMAL HIGH (ref 70–99)
Glucose-Capillary: 140 mg/dL — ABNORMAL HIGH (ref 70–99)
Glucose-Capillary: 125 mg/dL — ABNORMAL HIGH (ref 70–99)

## 2024-04-21 LAB — CBC
HCT: 34.9 % — ABNORMAL LOW (ref 39.0–52.0)
Hemoglobin: 10.6 g/dL — ABNORMAL LOW (ref 13.0–17.0)
MCH: 27.1 pg (ref 26.0–34.0)
MCHC: 30.4 g/dL (ref 30.0–36.0)
MCV: 89.3 fL (ref 80.0–100.0)
Platelets: 409 K/uL — ABNORMAL HIGH (ref 150–400)
RBC: 3.91 MIL/uL — ABNORMAL LOW (ref 4.22–5.81)
RDW: 16.9 % — ABNORMAL HIGH (ref 11.5–15.5)
WBC: 6.9 K/uL (ref 4.0–10.5)
nRBC: 0 % (ref 0.0–0.2)

## 2024-04-21 NOTE — Progress Notes (Signed)
 Pt placed on ATC 40% and is tolerating well at this time.

## 2024-04-21 NOTE — Progress Notes (Signed)
 PROGRESS NOTE    Garrett [name]  FMW:[id number] DOB: 1975-08-30 DOA: 10/27/2023 PCP: Pcp, No   Brief Narrative:  48 y/o man who was found down at home. PMHx significant for HTN.   Patient was down for an unknown amount of time, found by his fiance when she came home from work, reportedly with agonal breathing. Patient had driven her to work the morning of admission.  He has HTN but never checks his BP and does not follow with MD. Patient's fiance states that she can tell when his BP is really high; his eyes get blood shot and he is sweating. No h/o seizures. Besides almost daily marijuana and cigarette smoking, she is not aware of any other drug use. Drug screen positive for opioids and he was given several doses of Narcan . 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 BP in ED 262/156. CXR revealed RUL and perihilar infiltrates suggesting aspiration pneumonia. ED labs also revealed PH 7.169, LA 4.4, CPK 985. Patient was intubated in ED.   PCCM consulted.   Pertinent Medical History:  Hypertension   Significant Hospital Events: Including procedures, antibiotic start and stop dates in addition to other pertinent events   3/08 - Intubated in ED, pontine bleed/SAH. CT head 17:09 Acute large hemorrhage 4.1 cm in pons with intraventricular extension without hydrocephalus 3/09 - CTA 03:14 AM No change in IPH in pons, similar biparietal [facility] 3/14 - Now awake, appears locked in can follow commands with eyes. 3/16 - Purposeful with left upper extremity  3/22 - Tracheostomy placed at bedside, bleeding issues overnight from trach site 3/24 - PEG (Dr. Sebastian) 3/24 - Sputum culture with MRSA 3/27 - Vomited. TF on hold. Abd film c/w ileus. Added reglan , got SSE. Did tolerate PSV all day   3/28 - BMs x2 after SSE day prior. Added back TFs at 1/2 rated. Tolerated PS 3/29 - Tolerated PS  3/31 - Tolerating CPAP PS 15/5, TF on hold due to ileus. 4/01 - Con't to hold tube feeds today, restarting vancomycin  and sending tracheal aspirate as peaks are 37, plat 24, driving 19. Thick secretions. Fever overnight.  4/02 - Peaks improved. Ongoing hiccups. Trickle feeding. Neuro exam unchanged.  4/20 - Trach was changed to cuffless #8 4/28 - Not safe to swallow. Limited communication and he follows simple commands. On TF 5/01 - On TC 28%, with large secretions. Working with speech and he is improving to initiate PMV trials under ST supervision   5/05 - On TC 30%, 7 L/min. Trach #6 cuffless, changed 5/1 to facilitate PMV trials  5/12 - On TC 21%, 6 L/min. Trach #6 cuffless, unable to produce sounds or speak on PMV. No significant resp secretions. On PEG TF 6/10 - Tracheostomy was removed due to failure to well provide adequate airway 02/15/2024 pulmonary critical care consult. 7/07 Patient obtunded and desaturating on NRB; transfer to icu for intubation; updated mother over phone who wants to reverse code status to full code; ent consulted 7/09 Remains intubated on no continuous sedation, right eye open but does not track movement  7/16 Revision ENT trach with [facility] Flap 7/18 Weaned on vent 5 hours  7/19 Tolerated trach collar for the majority of the day but had some increased work of breath resulting in being placed back on vent support  7/20 No issues overnight,  back on ATC this AM 7/21 Did not tolerate ATC. Placed back on PRVC. 7/24 ATC until afternoon and went on vent for WOB reportedly  7/25 ATC for a few hours  7/26 ATC x 6h 7/28 tolerating trach collar-tolerated for most of the day 7/30 on vent overnight and back on trach collar today 8/1 back on vent overnight  8/2 was able to maintain off of ventilator overnight but by 6 AM today patient was seen with signs of increased work of  breathing including tachycardia therefore patient was placed back on ventilator 8/3 remained on ventilator overnight to transition to trach collar this a.m. 8/4 did 18 hour ATC yesterday; back on vent 3am today 8/5 TC for 12-14 hours. Then went back to vent because of tachypnea.  8/6 tolerated ATC at 30% for 24 hours. Back on vent this AM for rest as was a bit tachypneic 8/11 been off vent >24 hours. 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  8/12-off vent for over 48 hours 8/18 completed abx  8/19 Changed to cuffless trach 8/20 Increasing tachypnea, hypercarbia.  Trach changed back to #6 cuffed and brought back to ICU to be placed on ventilator.  Antibiotics broadened to linezolid  and cefepime  8/23 PSV wean 8/24 eeg for twitching no sz 8/26 started TC 8/27 did TC overnight, hypercarbic on repeat gas but compensated 04/18/2024: Last seen by ophthalmology. 04/19/2024: Transferred under [facility].  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 Not a candidate for decannulation given ineffective airway clearance Now with worsening respiratory distress, hypercarbia with concern for new HAP 8/20 -  repeat ABG 8/27 am after 24hrs of TC> 7.357/ 58.6/ 96/ 33 cefepime  completed 8/25, trach asp 8/20 neg. Plan: - cont TC during day with full MV/ PRVC at night, now suspected to be night time vent dependent given recurrent compensated hypercarbia - VAP/ H2B - trach care per protocol - monitor clinically off abx, weekly CBC  - cont BD.  All management per PCCM.   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  Reassessed by ophthalmology again on 04/18/2024, recommendations to continue erythromycin  eye drops q2 per Optho.  Recs to tape eyes with paper tape if remain open without blinking and to notify optho if worsening redness or opacification of right eye.  However on my examination, patient appears to have keratopathy of the left eye.  I presume that the lateralization of the eye is just an error in ophthalmology note.   HTN Blood pressure controlled, continue scheduled amlodipine , Lopressor  and as needed hydralazine .   DVT prophylaxis: enoxaparin  (LOVENOX ) injection 40 mg Start: 03/06/24 1000 SCDs Start: 10/27/23 2226   Code Status: Full Code  Family  Communication:  None present at bedside.   Status is: Inpatient Remains inpatient appropriate because: Pending insurance authorization   Estimated body mass index is 43.58 kg/m as calculated from the following:   Height as of this encounter: 5' 8 (1.727 m).   Weight as of this encounter: 130 kg.    Nutritional Assessment: Body mass index is 43.58 kg/m.SABRA Seen by dietician.  I agree with the assessment and plan as outlined below: Nutrition Status: Nutrition Problem: Inadequate oral intake Etiology: inability to eat Signs/Symptoms: NPO status Interventions: Tube feeding, Prostat, MVI, Juven  . Skin Assessment: I have examined the patient's skin and I agree with the wound assessment as performed by the wound care RN as outlined below:    Consultants:  Critical care and  ophthalmology  Procedures:  Above  Antimicrobials:  Anti-infectives (From admission, onward)    Start     Dose/Rate Route Frequency Ordered Stop   04/09/24 2200  linezolid  (ZYVOX ) tablet 600 mg  Status:  Discontinued        600 mg Per Tube Every 12 hours 04/09/24 1132 04/13/24 1332   04/09/24 1700  vancomycin  (VANCOREADY) IVPB 1500 mg/300 mL  Status:  Discontinued        1,500 mg 150 mL/hr over 120 Minutes Intravenous Every 12 hours 04/09/24 1108 04/09/24 1132   04/09/24 1000  linezolid  (ZYVOX ) IVPB 600 mg  Status:  Discontinued        600 mg 300 mL/hr over 60 Minutes Intravenous Every 12 hours 04/09/24 0527 04/09/24 1108   04/01/24 1145  ceFEPIme  (MAXIPIME ) 2 g in sodium chloride  0.9 % 100 mL IVPB        2 g 200 mL/hr over 30 Minutes Intravenous Every 8 hours 04/01/24 1137 04/14/24 2230   02/21/24 1000  vancomycin  (VANCOREADY) IVPB 1250 mg/250 mL        1,250 mg 166.7 mL/hr over 90 Minutes Intravenous Every 12 hours 02/20/24 2158 02/28/24 0130   02/16/24 1445  ceFEPIme  (MAXIPIME ) 2 g in sodium chloride  0.9 % 100 mL IVPB        2 g 200 mL/hr over 30 Minutes Intravenous Every 8 hours 02/16/24 1348 02/27/24 2322   02/16/24 0945  vancomycin  (VANCOREADY) IVPB 1500 mg/300 mL  Status:  Discontinued        1,500 mg 150 mL/hr over 120 Minutes Intravenous Every 12 hours 02/16/24 0848 02/20/24 2158   02/15/24 1215  Ampicillin -Sulbactam (UNASYN ) 3 g in sodium chloride  0.9 % 100 mL IVPB  Status:  Discontinued        3 g 200 mL/hr over 30 Minutes Intravenous Every 6 hours 02/15/24 1125 02/16/24 1226   01/24/24 2000  vancomycin  (VANCOREADY) IVPB 1250 mg/250 mL        1,250 mg 166.7 mL/hr over 90 Minutes Intravenous Every 24 hours 01/24/24 1154 01/27/24 0125   01/22/24 2200  Vancomycin  (VANCOCIN ) 1,250 mg in sodium chloride  0.9 % 250 mL IVPB  Status:  Discontinued        1,250 mg 166.7 mL/hr over 90 Minutes Intravenous Every 24 hours 01/22/24 1013 01/22/24 1020   01/22/24 2000  vancomycin   (VANCOREADY) IVPB 1250 mg/250 mL  Status:  Discontinued        1,250 mg 166.7 mL/hr over 90 Minutes Intravenous Every 24 hours 01/22/24 1020 01/24/24 1154   01/20/24 2000  ceFEPIme  (MAXIPIME ) 2 g in sodium chloride  0.9 % 100 mL IVPB  Status:  Discontinued        2 g 200 mL/hr  over 30 Minutes Intravenous Every 8 hours 01/20/24 1900 01/24/24 1154   01/20/24 2000  Vancomycin  (VANCOCIN ) 1,250 mg in sodium chloride  0.9 % 250 mL IVPB  Status:  Discontinued        1,250 mg 166.7 mL/hr over 90 Minutes Intravenous Every 24 hours 01/20/24 1900 01/22/24 1013   01/03/24 1730  cefTRIAXone  (ROCEPHIN ) 1 g in sodium chloride  0.9 % 100 mL IVPB        1 g 200 mL/hr over 30 Minutes Intravenous Every 24 hours 01/03/24 1235 01/07/24 0710   01/02/24 1830  ceFEPIme  (MAXIPIME ) 2 g in sodium chloride  0.9 % 100 mL IVPB  Status:  Discontinued        2 g 200 mL/hr over 30 Minutes Intravenous Every 8 hours 01/02/24 1732 01/03/24 1235   01/02/24 1830  Vancomycin  (VANCOCIN ) 1,250 mg in sodium chloride  0.9 % 250 mL IVPB  Status:  Discontinued        1,250 mg 166.7 mL/hr over 90 Minutes Intravenous Every 24 hours 01/02/24 1732 01/04/24 1754   01/01/24 1200  cefTRIAXone  (ROCEPHIN ) 1 g in sodium chloride  0.9 % 100 mL IVPB  Status:  Discontinued        1 g 200 mL/hr over 30 Minutes Intravenous Every 24 hours 01/01/24 1057 01/02/24 1713   11/29/23 2200  linezolid  (ZYVOX ) tablet 600 mg        600 mg Per Tube Every 12 hours 11/29/23 1034 12/04/23 2215   11/28/23 1000  linezolid  (ZYVOX ) IVPB 600 mg  Status:  Discontinued        600 mg 300 mL/hr over 60 Minutes Intravenous Every 12 hours 11/28/23 0831 11/29/23 1034   11/23/23 1200  vancomycin  (VANCOREADY) IVPB 1250 mg/250 mL  Status:  Discontinued        1,250 mg 166.7 mL/hr over 90 Minutes Intravenous Every 24 hours 11/23/23 1036 11/28/23 0831   11/20/23 1100  vancomycin  (VANCOREADY) IVPB 1250 mg/250 mL  Status:  Discontinued        1,250 mg 166.7 mL/hr over 90 Minutes  Intravenous Every 24 hours 11/20/23 0923 11/20/23 0923   11/20/23 1100  vancomycin  (VANCOREADY) IVPB 1250 mg/250 mL  Status:  Discontinued        1,250 mg 166.7 mL/hr over 90 Minutes Intravenous Every 24 hours 11/20/23 0923 11/23/23 1036   11/15/23 2300  vancomycin  (VANCOREADY) IVPB 1250 mg/250 mL        1,250 mg 166.7 mL/hr over 90 Minutes Intravenous Every 24 hours 11/15/23 2215 11/19/23 0924   11/12/23 2200  vancomycin  (VANCOREADY) IVPB 750 mg/150 mL  Status:  Discontinued        750 mg 150 mL/hr over 60 Minutes Intravenous Every 12 hours 11/12/23 0855 11/15/23 2215   11/12/23 0930  vancomycin  (VANCOREADY) IVPB 2000 mg/400 mL        2,000 mg 200 mL/hr over 120 Minutes Intravenous  Once 11/12/23 0838 11/12/23 1116   11/11/23 1345  Ampicillin -Sulbactam (UNASYN ) 3 g in sodium chloride  0.9 % 100 mL IVPB  Status:  Discontinued        3 g 200 mL/hr over 30 Minutes Intravenous Every 6 hours 11/11/23 1257 11/12/23 0836   10/28/23 0400  Ampicillin -Sulbactam (UNASYN ) 3 g in sodium chloride  0.9 % 100 mL IVPB  Status:  Discontinued        3 g 200 mL/hr over 30 Minutes Intravenous Every 6 hours 10/27/23 2151 11/02/23 0945   10/27/23 1845  vancomycin  (VANCOCIN ) IVPB 1000 mg/200 mL premix  Status:  Discontinued        1,000 mg 200 mL/hr over 60 Minutes Intravenous  Once 10/27/23 1843 10/27/23 1843   10/27/23 1845  ceFEPIme  (MAXIPIME ) 2 g in sodium chloride  0.9 % 100 mL IVPB        2 g 200 mL/hr over 30 Minutes Intravenous  Once 10/27/23 1843 10/27/23 2024   10/27/23 1845  vancomycin  (VANCOREADY) IVPB 2000 mg/400 mL        2,000 mg 200 mL/hr over 120 Minutes Intravenous  Once 10/27/23 1843 10/27/23 2123         Subjective: Patient seen and examined.  Patient remains status quo.  Objective: Vitals:   04/21/24 0600 04/21/24 0735 04/21/24 0958 04/21/24 1141  BP: 110/84  132/88   Pulse: 64  89   Resp: 16     Temp:  97.9 F (36.6 C)  98.4 F (36.9 C)  TempSrc:  Axillary  Axillary  SpO2:  100%     Weight:      Height:        Intake/Output Summary (Last 24 hours) at 04/21/2024 1301 Last data filed at 04/21/2024 0630 Gross per 24 hour  Intake 774 ml  Output 1050 ml  Net -276 ml   Filed Weights   04/15/24 0600 04/16/24 0500 04/17/24 0318  Weight: 136 kg 135 kg 130 kg    Examination:  General exam: Appears calm and comfortable  Respiratory system: Has tracheostomy. Cardiovascular system: S1 & S2 heard, RRR. No JVD, murmurs, rubs, gallops or clicks. No pedal edema. Gastrointestinal system: Abdomen is nondistended, soft and nontender. No organomegaly or masses felt. Normal bowel sounds heard. Central nervous system: Not responding to any commands, not oriented,  Data Reviewed: I have personally reviewed following labs and imaging studies  CBC: Recent Labs  Lab 04/16/24 0806 04/21/24 0746  WBC  --  6.9  HGB 11.6* 10.6*  HCT 34.0* 34.9*  MCV  --  89.3  PLT  --  409*   Basic Metabolic Panel: Recent Labs  Lab 04/16/24 0806 04/16/24 0812 04/18/24 0806 04/20/24 1047  NA 139 135 138 140  K 3.6 3.8 3.8 4.3  CL  --  99 96* 99  CO2  --  28 30 29   GLUCOSE  --  98 180* 93  BUN  --  15 27* 33*  CREATININE  --  0.44* 0.57* 0.53*  CALCIUM  --  9.5 9.7 9.7   GFR: Estimated Creatinine Clearance: 150.2 mL/min (A) (by C-G formula based on SCr of 0.53 mg/dL (L)). Liver Function Tests: No results for input(s): AST, ALT, ALKPHOS, BILITOT, PROT, ALBUMIN  in the last 168 hours. No results for input(s): LIPASE, AMYLASE in the last 168 hours. No results for input(s): AMMONIA in the last 168 hours. Coagulation Profile: No results for input(s): INR, PROTIME in the last 168 hours. Cardiac Enzymes: No results for input(s): CKTOTAL, CKMB, CKMBINDEX, TROPONINI in the last 168 hours. BNP (last 3 results) No results for input(s): PROBNP in the last 8760 hours. HbA1C: No results for input(s): HGBA1C in the last 72 hours. CBG: Recent Labs  Lab  04/20/24 1917 04/20/24 2306 04/21/24 0313 04/21/24 0733 04/21/24 1142  GLUCAP 107* 112* 84 145* 141*   Lipid Profile: No results for input(s): CHOL, HDL, LDLCALC, TRIG, CHOLHDL, LDLDIRECT in the last 72 hours. Thyroid Function Tests: No results for input(s): TSH, T4TOTAL, FREET4, T3FREE, THYROIDAB in the last 72 hours. Anemia Panel: No results for input(s): VITAMINB12, FOLATE, FERRITIN, TIBC, IRON, RETICCTPCT in the  last 72 hours. Sepsis Labs: No results for input(s): PROCALCITON, LATICACIDVEN in the last 168 hours.  No results found for this or any previous visit (from the past 240 hours).   Radiology Studies: No results found.  Scheduled Meds:  amLODipine   10 mg Per Tube Daily   Chlorhexidine  Gluconate Cloth  6 each Topical Daily   enoxaparin  (LOVENOX ) injection  40 mg Subcutaneous Daily   erythromycin    Both Eyes Q2H   famotidine   20 mg Per Tube BID   feeding supplement (JEVITY 1.5 CAL/FIBER)  237 mL Per Tube 5 X Daily   feeding supplement (PROSource TF20)  60 mL Per Tube TID   free water   100 mL Per Tube Q6H   insulin  aspart  0-15 Units Subcutaneous Q4H   metoprolol  tartrate  75 mg Per Tube BID   multivitamin with minerals  1 tablet Per Tube Daily   mouth rinse  15 mL Mouth Rinse Q2H   polyethylene glycol  17 g Per Tube Daily   sodium chloride  flush  10-40 mL Intracatheter Q12H   Continuous Infusions:   LOS: 177 days   Fredia Skeeter, MD [facility]  04/21/2024, 1:01 PM   *Please note that this is a verbal dictation therefore any spelling or grammatical errors are due to the Dragon Medical One system interpretation.  Please page via Amion and do not message via secure chat for urgent patient care matters. Secure chat can be used for non urgent patient care matters.  How to contact the [facility] Attending or Consulting provider 7A - 7P or covering provider during after hours 7P -7A, for this patient?  Check the care team in  [facility] and look for a) attending/consulting [facility] provider listed and b) the [facility] team listed. Page or secure chat 7A-7P. Log into [email or web address] and use 's universal password to access. If you do not have the password, please contact the hospital operator. Locate the [facility] provider you are looking for under [facility] and page to a number that you can be directly reached. If you still have difficulty reaching the provider, please page the [facility] (Director on Call) for the Hospitalists listed on amion for assistance.

## 2024-04-22 DIAGNOSIS — G835 Locked-in state: Secondary | ICD-10-CM | POA: Diagnosis not present

## 2024-04-22 DIAGNOSIS — J9621 Acute and chronic respiratory failure with hypoxia: Secondary | ICD-10-CM | POA: Diagnosis not present

## 2024-04-22 DIAGNOSIS — Z930 Tracheostomy status: Secondary | ICD-10-CM | POA: Diagnosis not present

## 2024-04-22 DIAGNOSIS — J9622 Acute and chronic respiratory failure with hypercapnia: Secondary | ICD-10-CM | POA: Diagnosis not present

## 2024-04-22 DIAGNOSIS — I619 Nontraumatic intracerebral hemorrhage, unspecified: Secondary | ICD-10-CM | POA: Diagnosis not present

## 2024-04-22 LAB — GLUCOSE, CAPILLARY
Glucose-Capillary: 88 mg/dL (ref 70–99)
Glucose-Capillary: 126 mg/dL — ABNORMAL HIGH (ref 70–99)
Glucose-Capillary: 147 mg/dL — ABNORMAL HIGH (ref 70–99)
Glucose-Capillary: 126 mg/dL — ABNORMAL HIGH (ref 70–99)
Glucose-Capillary: 111 mg/dL — ABNORMAL HIGH (ref 70–99)
Glucose-Capillary: 135 mg/dL — ABNORMAL HIGH (ref 70–99)

## 2024-04-22 LAB — BASIC METABOLIC PANEL WITH GFR
Anion gap: 13 (ref 5–15)
BUN: 34 mg/dL — ABNORMAL HIGH (ref 6–20)
CO2: 25 mmol/L (ref 22–32)
Calcium: 9.5 mg/dL (ref 8.9–10.3)
Chloride: 105 mmol/L (ref 98–111)
Creatinine, Ser: 0.63 mg/dL (ref 0.61–1.24)
GFR, Estimated: >60 mL/min (ref >60)
Glucose, Bld: 130 mg/dL — ABNORMAL HIGH (ref 70–99)
Potassium: 3.7 mmol/L (ref 3.5–5.1)
Sodium: 143 mmol/L (ref 135–145)

## 2024-04-22 MED ORDER — IPRATROPIUM-ALBUTEROL 0.5-2.5 (3) MG/3ML IN SOLN
3.0000 mL | RESPIRATORY_TRACT | Status: DC | PRN
  Administered 2024-06-02 – 2024-06-05 (×3): 3 mL via RESPIRATORY_TRACT
  Filled 2024-04-22 (×3): qty 3

## 2024-04-22 NOTE — Plan of Care (Signed)
  Problem: Respiratory: Goal: Ability to maintain a clear airway and adequate ventilation will improve Outcome: Progressing   Problem: Respiratory: Goal: Patent airway maintenance will improve Outcome: Progressing   Problem: Elimination: Goal: Will not experience complications related to bowel motility Outcome: Not Progressing   Problem: Tissue Perfusion: Goal: Adequacy of tissue perfusion will improve Outcome: Not Progressing   Problem: Self-Care: Goal: Ability to participate in self-care as condition permits will improve Outcome: Not Progressing Goal: Verbalization of feelings and concerns over difficulty with self-care will improve Outcome: Not Progressing Goal: Ability to communicate needs accurately will improve Outcome: Not Progressing   Problem: Nutrition: Goal: Risk of aspiration will decrease Outcome: Progressing   Problem: Self-Care: Goal: Ability to participate in self-care as condition permits will improve Outcome: Not Progressing   Problem: Education: Goal: Knowledge of disease or condition will improve Outcome: Not Progressing

## 2024-04-22 NOTE — Progress Notes (Signed)
 Pt placed on PS/CPAP but did not tolerate due to low MVE. (0914)RR dropped to 12 and will attempt again at a later time.

## 2024-04-22 NOTE — Progress Notes (Signed)
 PROGRESS NOTE    Garrett [name]  FMW:[id number] DOB: Dec 31, 1975 DOA: 10/27/2023 PCP: Pcp, No   Brief Narrative:  48 y/o man who was found down at home. PMHx significant for HTN.   Patient was down for an unknown amount of time, found by his fiance when she came home from work, reportedly with agonal breathing. Patient had driven her to work the morning of admission.  He has HTN but never checks his BP and does not follow with MD. Patient's fiance states that she can tell when his BP is really high; his eyes get blood shot and he is sweating. No h/o seizures. Besides almost daily marijuana and cigarette smoking, she is not aware of any other drug use. Drug screen positive for opioids and he was given several doses of Narcan . 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 BP in ED 262/156. CXR revealed RUL and perihilar infiltrates suggesting aspiration pneumonia. ED labs also revealed PH 7.169, LA 4.4, CPK 985. Patient was intubated in ED.   PCCM consulted.   Pertinent Medical History:  Hypertension   Significant Hospital Events: Including procedures, antibiotic start and stop dates in addition to other pertinent events   3/08 - Intubated in ED, pontine bleed/SAH. CT head 17:09 Acute large hemorrhage 4.1 cm in pons with intraventricular extension without hydrocephalus 3/09 - CTA 03:14 AM No change in IPH in pons, similar biparietal [facility] 3/14 - Now awake, appears locked in can follow commands with eyes. 3/16 - Purposeful with left upper extremity  3/22 - Tracheostomy placed at bedside, bleeding issues overnight from trach site 3/24 - PEG (Dr. Sebastian) 3/24 - Sputum culture with MRSA 3/27 - Vomited. TF on hold. Abd film c/w ileus. Added reglan , got SSE. Did tolerate PSV all day   3/28 - BMs x2 after SSE day prior. Added back TFs at 1/2 rated. Tolerated PS 3/29 - Tolerated PS  3/31 - Tolerating CPAP PS 15/5, TF on hold due to ileus. 4/01 - Con't to hold tube feeds today, restarting vancomycin  and sending tracheal aspirate as peaks are 37, plat 24, driving 19. Thick secretions. Fever overnight.  4/02 - Peaks improved. Ongoing hiccups. Trickle feeding. Neuro exam unchanged.  4/20 - Trach was changed to cuffless #8 4/28 - Not safe to swallow. Limited communication and he follows simple commands. On TF 5/01 - On TC 28%, with large secretions. Working with speech and he is improving to initiate PMV trials under ST supervision   5/05 - On TC 30%, 7 L/min. Trach #6 cuffless, changed 5/1 to facilitate PMV trials  5/12 - On TC 21%, 6 L/min. Trach #6 cuffless, unable to produce sounds or speak on PMV. No significant resp secretions. On PEG TF 6/10 - Tracheostomy was removed due to failure to well provide adequate airway 02/15/2024 pulmonary critical care consult. 7/07 Patient obtunded and desaturating on NRB; transfer to icu for intubation; updated mother over phone who wants to reverse code status to full code; ent consulted 7/09 Remains intubated on no continuous sedation, right eye open but does not track movement  7/16 Revision ENT trach with [facility] Flap 7/18 Weaned on vent 5 hours  7/19 Tolerated trach collar for the majority of the day but had some increased work of breath resulting in being placed back on vent support  7/20 No issues overnight,  back on ATC this AM 7/21 Did not tolerate ATC. Placed back on PRVC. 7/24 ATC until afternoon and went on vent for WOB reportedly  7/25 ATC for a few hours  7/26 ATC x 6h 7/28 tolerating trach collar-tolerated for most of the day 7/30 on vent overnight and back on trach collar today 8/1 back on vent overnight  8/2 was able to maintain off of ventilator overnight but by 6 AM today patient was seen with signs of increased work of  breathing including tachycardia therefore patient was placed back on ventilator 8/3 remained on ventilator overnight to transition to trach collar this a.m. 8/4 did 18 hour ATC yesterday; back on vent 3am today 8/5 TC for 12-14 hours. Then went back to vent because of tachypnea.  8/6 tolerated ATC at 30% for 24 hours. Back on vent this AM for rest as was a bit tachypneic 8/11 been off vent >24 hours. 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  8/12-off vent for over 48 hours 8/18 completed abx  8/19 Changed to cuffless trach 8/20 Increasing tachypnea, hypercarbia.  Trach changed back to #6 cuffed and brought back to ICU to be placed on ventilator.  Antibiotics broadened to linezolid  and cefepime  8/23 PSV wean 8/24 eeg for twitching no sz 8/26 started TC 8/27 did TC overnight, hypercarbic on repeat gas but compensated 04/18/2024: Last seen by ophthalmology. 04/19/2024: Transferred under [facility].  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 Not a candidate for decannulation given ineffective airway clearance Now with worsening respiratory distress, hypercarbia with concern for new HAP 8/20 -  repeat ABG 8/27 am after 24hrs of TC> 7.357/ 58.6/ 96/ 33 cefepime  completed 8/25, trach asp 8/20 neg. Plan: - cont TC during day with full MV/ PRVC at night, now suspected to be night time vent dependent given recurrent compensated hypercarbia - VAP/ H2B - trach care per protocol - monitor clinically off abx, weekly CBC  - cont BD.  All management per PCCM.   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  Reassessed by ophthalmology again on 04/18/2024, recommendations to continue erythromycin  eye drops q2 per Optho.  Recs to tape eyes with paper tape if remain open without blinking and to notify optho if worsening redness or opacification of right eye.  However on my examination, patient appears to have keratopathy of the left eye.  I presume that the lateralization of the eye is just an error in ophthalmology note.   HTN Blood pressure controlled, continue scheduled amlodipine , Lopressor  and as needed hydralazine .   DVT prophylaxis: enoxaparin  (LOVENOX ) injection 40 mg Start: 03/06/24 1000 SCDs Start: 10/27/23 2226   Code Status: Full Code  Family  Communication:  None present at bedside.   Status is: Inpatient Remains inpatient appropriate because: Pending insurance authorization   Estimated body mass index is 43.58 kg/m as calculated from the following:   Height as of this encounter: 5' 8 (1.727 m).   Weight as of this encounter: 130 kg.    Nutritional Assessment: Body mass index is 43.58 kg/m.SABRA Seen by dietician.  I agree with the assessment and plan as outlined below: Nutrition Status: Nutrition Problem: Inadequate oral intake Etiology: inability to eat Signs/Symptoms: NPO status Interventions: Tube feeding, Prostat, MVI, Juven  . Skin Assessment: I have examined the patient's skin and I agree with the wound assessment as performed by the wound care RN as outlined below:    Consultants:  Critical care and  ophthalmology  Procedures:  Above  Antimicrobials:  Anti-infectives (From admission, onward)    Start     Dose/Rate Route Frequency Ordered Stop   04/09/24 2200  linezolid  (ZYVOX ) tablet 600 mg  Status:  Discontinued        600 mg Per Tube Every 12 hours 04/09/24 1132 04/13/24 1332   04/09/24 1700  vancomycin  (VANCOREADY) IVPB 1500 mg/300 mL  Status:  Discontinued        1,500 mg 150 mL/hr over 120 Minutes Intravenous Every 12 hours 04/09/24 1108 04/09/24 1132   04/09/24 1000  linezolid  (ZYVOX ) IVPB 600 mg  Status:  Discontinued        600 mg 300 mL/hr over 60 Minutes Intravenous Every 12 hours 04/09/24 0527 04/09/24 1108   04/01/24 1145  ceFEPIme  (MAXIPIME ) 2 g in sodium chloride  0.9 % 100 mL IVPB        2 g 200 mL/hr over 30 Minutes Intravenous Every 8 hours 04/01/24 1137 04/14/24 2230   02/21/24 1000  vancomycin  (VANCOREADY) IVPB 1250 mg/250 mL        1,250 mg 166.7 mL/hr over 90 Minutes Intravenous Every 12 hours 02/20/24 2158 02/28/24 0130   02/16/24 1445  ceFEPIme  (MAXIPIME ) 2 g in sodium chloride  0.9 % 100 mL IVPB        2 g 200 mL/hr over 30 Minutes Intravenous Every 8 hours 02/16/24 1348 02/27/24 2322   02/16/24 0945  vancomycin  (VANCOREADY) IVPB 1500 mg/300 mL  Status:  Discontinued        1,500 mg 150 mL/hr over 120 Minutes Intravenous Every 12 hours 02/16/24 0848 02/20/24 2158   02/15/24 1215  Ampicillin -Sulbactam (UNASYN ) 3 g in sodium chloride  0.9 % 100 mL IVPB  Status:  Discontinued        3 g 200 mL/hr over 30 Minutes Intravenous Every 6 hours 02/15/24 1125 02/16/24 1226   01/24/24 2000  vancomycin  (VANCOREADY) IVPB 1250 mg/250 mL        1,250 mg 166.7 mL/hr over 90 Minutes Intravenous Every 24 hours 01/24/24 1154 01/27/24 0125   01/22/24 2200  Vancomycin  (VANCOCIN ) 1,250 mg in sodium chloride  0.9 % 250 mL IVPB  Status:  Discontinued        1,250 mg 166.7 mL/hr over 90 Minutes Intravenous Every 24 hours 01/22/24 1013 01/22/24 1020   01/22/24 2000  vancomycin   (VANCOREADY) IVPB 1250 mg/250 mL  Status:  Discontinued        1,250 mg 166.7 mL/hr over 90 Minutes Intravenous Every 24 hours 01/22/24 1020 01/24/24 1154   01/20/24 2000  ceFEPIme  (MAXIPIME ) 2 g in sodium chloride  0.9 % 100 mL IVPB  Status:  Discontinued        2 g 200 mL/hr  over 30 Minutes Intravenous Every 8 hours 01/20/24 1900 01/24/24 1154   01/20/24 2000  Vancomycin  (VANCOCIN ) 1,250 mg in sodium chloride  0.9 % 250 mL IVPB  Status:  Discontinued        1,250 mg 166.7 mL/hr over 90 Minutes Intravenous Every 24 hours 01/20/24 1900 01/22/24 1013   01/03/24 1730  cefTRIAXone  (ROCEPHIN ) 1 g in sodium chloride  0.9 % 100 mL IVPB        1 g 200 mL/hr over 30 Minutes Intravenous Every 24 hours 01/03/24 1235 01/07/24 0710   01/02/24 1830  ceFEPIme  (MAXIPIME ) 2 g in sodium chloride  0.9 % 100 mL IVPB  Status:  Discontinued        2 g 200 mL/hr over 30 Minutes Intravenous Every 8 hours 01/02/24 1732 01/03/24 1235   01/02/24 1830  Vancomycin  (VANCOCIN ) 1,250 mg in sodium chloride  0.9 % 250 mL IVPB  Status:  Discontinued        1,250 mg 166.7 mL/hr over 90 Minutes Intravenous Every 24 hours 01/02/24 1732 01/04/24 1754   01/01/24 1200  cefTRIAXone  (ROCEPHIN ) 1 g in sodium chloride  0.9 % 100 mL IVPB  Status:  Discontinued        1 g 200 mL/hr over 30 Minutes Intravenous Every 24 hours 01/01/24 1057 01/02/24 1713   11/29/23 2200  linezolid  (ZYVOX ) tablet 600 mg        600 mg Per Tube Every 12 hours 11/29/23 1034 12/04/23 2215   11/28/23 1000  linezolid  (ZYVOX ) IVPB 600 mg  Status:  Discontinued        600 mg 300 mL/hr over 60 Minutes Intravenous Every 12 hours 11/28/23 0831 11/29/23 1034   11/23/23 1200  vancomycin  (VANCOREADY) IVPB 1250 mg/250 mL  Status:  Discontinued        1,250 mg 166.7 mL/hr over 90 Minutes Intravenous Every 24 hours 11/23/23 1036 11/28/23 0831   11/20/23 1100  vancomycin  (VANCOREADY) IVPB 1250 mg/250 mL  Status:  Discontinued        1,250 mg 166.7 mL/hr over 90 Minutes  Intravenous Every 24 hours 11/20/23 0923 11/20/23 0923   11/20/23 1100  vancomycin  (VANCOREADY) IVPB 1250 mg/250 mL  Status:  Discontinued        1,250 mg 166.7 mL/hr over 90 Minutes Intravenous Every 24 hours 11/20/23 0923 11/23/23 1036   11/15/23 2300  vancomycin  (VANCOREADY) IVPB 1250 mg/250 mL        1,250 mg 166.7 mL/hr over 90 Minutes Intravenous Every 24 hours 11/15/23 2215 11/19/23 0924   11/12/23 2200  vancomycin  (VANCOREADY) IVPB 750 mg/150 mL  Status:  Discontinued        750 mg 150 mL/hr over 60 Minutes Intravenous Every 12 hours 11/12/23 0855 11/15/23 2215   11/12/23 0930  vancomycin  (VANCOREADY) IVPB 2000 mg/400 mL        2,000 mg 200 mL/hr over 120 Minutes Intravenous  Once 11/12/23 0838 11/12/23 1116   11/11/23 1345  Ampicillin -Sulbactam (UNASYN ) 3 g in sodium chloride  0.9 % 100 mL IVPB  Status:  Discontinued        3 g 200 mL/hr over 30 Minutes Intravenous Every 6 hours 11/11/23 1257 11/12/23 0836   10/28/23 0400  Ampicillin -Sulbactam (UNASYN ) 3 g in sodium chloride  0.9 % 100 mL IVPB  Status:  Discontinued        3 g 200 mL/hr over 30 Minutes Intravenous Every 6 hours 10/27/23 2151 11/02/23 0945   10/27/23 1845  vancomycin  (VANCOCIN ) IVPB 1000 mg/200 mL premix  Status:  Discontinued        1,000 mg 200 mL/hr over 60 Minutes Intravenous  Once 10/27/23 1843 10/27/23 1843   10/27/23 1845  ceFEPIme  (MAXIPIME ) 2 g in sodium chloride  0.9 % 100 mL IVPB        2 g 200 mL/hr over 30 Minutes Intravenous  Once 10/27/23 1843 10/27/23 2024   10/27/23 1845  vancomycin  (VANCOREADY) IVPB 2000 mg/400 mL        2,000 mg 200 mL/hr over 120 Minutes Intravenous  Once 10/27/23 1843 10/27/23 2123         Subjective: See and examine.  Patient remains status quo.  Objective: Vitals:   04/22/24 0800 04/22/24 0900 04/22/24 0949 04/22/24 0950  BP: (!) 125/92 112/84 112/84 112/84  Pulse: 72 71 73 72  Resp: 20 20  12   Temp:      TempSrc:      SpO2: 100% 100%  100%  Weight:       Height:        Intake/Output Summary (Last 24 hours) at 04/22/2024 0958 Last data filed at 04/22/2024 0949 Gross per 24 hour  Intake 637 ml  Output 450 ml  Net 187 ml   Filed Weights   04/15/24 0600 04/16/24 0500 04/17/24 0318  Weight: 136 kg 135 kg 130 kg    Examination:  General exam: Appears calm and comfortable  Respiratory system: Has tracheostomy. Cardiovascular system: S1 & S2 heard, RRR. No JVD, murmurs, rubs, gallops or clicks. No pedal edema. Gastrointestinal system: Abdomen is nondistended, soft and nontender. No organomegaly or masses felt. Normal bowel sounds heard. Central nervous system: Not responding to any commands, not oriented,  Data Reviewed: I have personally reviewed following labs and imaging studies  CBC: Recent Labs  Lab 04/16/24 0806 04/21/24 0746  WBC  --  6.9  HGB 11.6* 10.6*  HCT 34.0* 34.9*  MCV  --  89.3  PLT  --  409*   Basic Metabolic Panel: Recent Labs  Lab 04/16/24 0806 04/16/24 0812 04/18/24 0806 04/20/24 1047  NA 139 135 138 140  K 3.6 3.8 3.8 4.3  CL  --  99 96* 99  CO2  --  28 30 29   GLUCOSE  --  98 180* 93  BUN  --  15 27* 33*  CREATININE  --  0.44* 0.57* 0.53*  CALCIUM  --  9.5 9.7 9.7   GFR: Estimated Creatinine Clearance: 150.2 mL/min (A) (by C-G formula based on SCr of 0.53 mg/dL (L)). Liver Function Tests: No results for input(s): AST, ALT, ALKPHOS, BILITOT, PROT, ALBUMIN  in the last 168 hours. No results for input(s): LIPASE, AMYLASE in the last 168 hours. No results for input(s): AMMONIA in the last 168 hours. Coagulation Profile: No results for input(s): INR, PROTIME in the last 168 hours. Cardiac Enzymes: No results for input(s): CKTOTAL, CKMB, CKMBINDEX, TROPONINI in the last 168 hours. BNP (last 3 results) No results for input(s): PROBNP in the last 8760 hours. HbA1C: No results for input(s): HGBA1C in the last 72 hours. CBG: Recent Labs  Lab 04/21/24 1544  04/21/24 1919 04/21/24 2307 04/22/24 0318 04/22/24 0738  GLUCAP 100* 140* 125* 88 126*   Lipid Profile: No results for input(s): CHOL, HDL, LDLCALC, TRIG, CHOLHDL, LDLDIRECT in the last 72 hours. Thyroid Function Tests: No results for input(s): TSH, T4TOTAL, FREET4, T3FREE, THYROIDAB in the last 72 hours. Anemia Panel: No results for input(s): VITAMINB12, FOLATE, FERRITIN, TIBC, IRON, RETICCTPCT in the last 72 hours. Sepsis Labs: No  results for input(s): PROCALCITON, LATICACIDVEN in the last 168 hours.  No results found for this or any previous visit (from the past 240 hours).   Radiology Studies: No results found.  Scheduled Meds:  amLODipine   10 mg Per Tube Daily   Chlorhexidine  Gluconate Cloth  6 each Topical Daily   enoxaparin  (LOVENOX ) injection  40 mg Subcutaneous Daily   erythromycin    Both Eyes Q2H   famotidine   20 mg Per Tube BID   feeding supplement (JEVITY 1.5 CAL/FIBER)  237 mL Per Tube 5 X Daily   feeding supplement (PROSource TF20)  60 mL Per Tube TID   free water   100 mL Per Tube Q6H   insulin  aspart  0-15 Units Subcutaneous Q4H   metoprolol  tartrate  75 mg Per Tube BID   multivitamin with minerals  1 tablet Per Tube Daily   mouth rinse  15 mL Mouth Rinse Q2H   polyethylene glycol  17 g Per Tube Daily   sodium chloride  flush  10-40 mL Intracatheter Q12H   Continuous Infusions:   LOS: 178 days   Fredia Skeeter, MD [facility]  04/22/2024, 9:58 AM   *Please note that this is a verbal dictation therefore any spelling or grammatical errors are due to the Dragon Medical One system interpretation.  Please page via Amion and do not message via secure chat for urgent patient care matters. Secure chat can be used for non urgent patient care matters.  How to contact the [facility] Attending or Consulting provider 7A - 7P or covering provider during after hours 7P -7A, for this patient?  Check the care team in [facility] and look for  a) attending/consulting [facility] provider listed and b) the [facility] team listed. Page or secure chat 7A-7P. Log into [email or web address] and use [facility] universal password to access. If you do not have the password, please contact the hospital operator. Locate the [facility] provider you are looking for under [facility] and page to a number that you can be directly reached. If you still have difficulty reaching the provider, please page the [facility] (Director on Call) for the Hospitalists listed on amion for assistance.

## 2024-04-22 NOTE — Progress Notes (Signed)
 NAME:  Emma Schupp, MRN:  [id number], DOB:  09/18/1975, LOS: 178 ADMISSION DATE:  10/27/2023, CONSULTATION DATE:  10/27/2023 REFERRING MD: Regalado - TRH, CHIEF COMPLAINT:  Found down   History of Present Illness:  48 y/o man who was found down at home. PMHx significant for HTN.  Patient was down for an unknown amount of time, found by his fiance when she came home from work, reportedly with agonal breathing. Patient had driven her to work the morning of admission.  He has HTN but never checks his BP and does not follow with MD. Patient's fiance states that she can tell when his BP is really high; his eyes get blood shot and he is sweating. No h/o seizures. Besides almost daily marijuana and cigarette smoking, she is not aware of any other drug use. Drug screen positive for opioids and he was given several doses of Narcan . 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 BP in ED 262/156. CXR revealed RUL and perihilar infiltrates suggesting aspiration pneumonia. ED labs also revealed PH 7.169, LA 4.4, CPK 985. Patient was intubated in ED.  PCCM consulted.  Pertinent Medical History:  Hypertension  Significant Hospital Events: Including procedures, antibiotic start and stop dates in addition to other pertinent events   3/08 - Intubated in ED, pontine bleed/SAH. CT head 17:09 Acute large hemorrhage 4.1 cm in pons with intraventricular extension without hydrocephalus 3/09 - CTA 03:14 AM No change in IPH in pons, similar biparietal [facility] 3/14 - Now awake, appears locked in can follow commands with eyes. 3/16 - Purposeful with left upper extremity  3/22 - Tracheostomy placed at bedside, bleeding issues overnight from trach site 3/24 - PEG (Dr. Sebastian) 3/24 - Sputum culture with MRSA 3/27  - Vomited. TF on hold. Abd film c/w ileus. Added reglan , got SSE. Did tolerate PSV all day  3/28 - BMs x2 after SSE day prior. Added back TFs at 1/2 rated. Tolerated PS 3/29 - Tolerated PS  3/31 - Tolerating CPAP PS 15/5, TF on hold due to ileus. 4/01 - Con't to hold tube feeds today, restarting vancomycin  and sending tracheal aspirate as peaks are 37, plat 24, driving 19. Thick secretions. Fever overnight.  4/02 - Peaks improved. Ongoing hiccups. Trickle feeding. Neuro exam unchanged.  4/20 - Trach was changed to cuffless #8 4/28 - Not safe to swallow. Limited communication and he follows simple commands. On TF 5/01 - On TC 28%, with large secretions. Working with speech and he is improving to initiate PMV trials under ST supervision   5/05 - On TC 30%, 7 L/min. Trach #6 cuffless, changed 5/1 to facilitate PMV trials  5/12 - On TC 21%, 6 L/min. Trach #6 cuffless, unable to produce sounds or speak on PMV. No significant resp secretions. On PEG TF 6/10 - Tracheostomy was removed due to failure to well provide adequate airway 02/15/2024 pulmonary critical care consult. 7/07 Patient obtunded and desaturating on NRB; transfer to icu for intubation; updated mother over phone who wants to reverse code status to full code; ent consulted 7/09 Remains intubated on no continuous sedation, right eye open but does not track movement  7/16 Revision ENT trach with [facility] Flap 7/18 Weaned on vent 5 hours  7/19 Tolerated trach collar for the majority of the day but had some increased work of breath  resulting in being placed back on vent support  7/20 No issues overnight, back on ATC this AM 7/21 Did not tolerate ATC. Placed back on PRVC. 7/24 ATC until afternoon and went on vent for WOB reportedly  7/25 ATC for a few hours  7/26 ATC x 6h 7/28 tolerating trach collar-tolerated for most of the day 7/30 on vent overnight and back on trach collar today 8/1 back on vent overnight  8/2 was able to maintain off of  ventilator overnight but by 6 AM today patient was seen with signs of increased work of breathing including tachycardia therefore patient was placed back on ventilator 8/3 remained on ventilator overnight to transition to trach collar this a.m. 8/4 did 18 hour ATC yesterday; back on vent 3am today 8/5 TC for 12-14 hours. Then went back to vent because of tachypnea.  8/6 tolerated ATC at 30% for 24 hours. Back on vent this AM for rest as was a bit tachypneic 8/11 been off vent >24 hours. 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  8/12-off vent for over 48 hours 8/18 completed abx  8/19 Changed to cuffless trach 8/20 Increasing tachypnea, hypercarbia.  Trach changed back to #6 cuffed and brought back to ICU to be placed on ventilator.  Antibiotics broadened to linezolid  and cefepime  8/23 PSV wean 8/24 eeg for twitching no sz 8/26 started TC 8/27 did TC overnight, hypercarbic on repeat gas but compensated  Interim History / Subjective:  No acute events overnight  Objective:   Blood pressure 107/83, pulse 65, temperature 97.8 F (36.6 C), temperature source Axillary, resp. rate 20, height 5' 8 (1.727 m), weight 130 kg, SpO2 100%.    Vent Mode: PRVC FiO2 (%):  [28 %-40 %] 40 % Set Rate:  [20 bmp] 20 bmp Vt Set:  [540 mL] 540 mL PEEP:  [5 cmH20] 5 cmH20 Plateau Pressure:  [17 cmH20-20 cmH20] 20 cmH20   Intake/Output Summary (Last 24 hours) at 04/22/2024 9161 Last data filed at 04/22/2024 9396 Gross per 24 hour  Intake 637 ml  Output 450 ml  Net 187 ml   Filed Weights   04/15/24 0600 04/16/24 0500 04/17/24 0318  Weight: 136 kg 135 kg 130 kg   Physical Examination: General:  chronically ill appearing on mech vent HEENT: MM pink/moist; trach in place; left eye w/ opacification Neuro: opens eyes to voice and tracks; not follow commands CV: s1s2, RRR, no m/r/g PULM:  dim clear BS bilaterally; on mech vent PRVC; trach in place GI: soft,  bsx4 hypoactive  Extremities: warm/dry, no edema    Resolved Problem List:  Post sedation hypotension Left eye conjunctivitis - resolved  Hyperglycemia, resolved Pseudomonas and E. 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 Not a candidate for decannulation given ineffective airway clearance Now with worsening respiratory distress, hypercarbia with concern for new HAP 8/20 - repeat ABG 8/27 am after 24hrs of TC> 7.357/ 58.6/ 96/ 33 cefepime  completed 8/25, trach asp 8/20 neg. Plan: -RT attempted psv and went apneic. Currently on prvc rate 20. Will drop rate to 12. Reattempt TC later this am. Goal TC during day and rest on prvc at night.  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  From 7A-7P if no response, please call [phone]. After hours, please call ELink [phone].

## 2024-04-23 DIAGNOSIS — I619 Nontraumatic intracerebral hemorrhage, unspecified: Secondary | ICD-10-CM | POA: Diagnosis not present

## 2024-04-23 LAB — GLUCOSE, CAPILLARY
Glucose-Capillary: 116 mg/dL — ABNORMAL HIGH (ref 70–99)
Glucose-Capillary: 121 mg/dL — ABNORMAL HIGH (ref 70–99)
Glucose-Capillary: 137 mg/dL — ABNORMAL HIGH (ref 70–99)
Glucose-Capillary: 140 mg/dL — ABNORMAL HIGH (ref 70–99)
Glucose-Capillary: 143 mg/dL — ABNORMAL HIGH (ref 70–99)
Glucose-Capillary: 152 mg/dL — ABNORMAL HIGH (ref 70–99)
Glucose-Capillary: 94 mg/dL (ref 70–99)

## 2024-04-23 MED ORDER — CHLORHEXIDINE GLUCONATE CLOTH 2 % EX PADS
6.0000 | MEDICATED_PAD | Freq: Every day | CUTANEOUS | Status: DC
  Administered 2024-04-24 – 2024-05-10 (×18): 6 via TOPICAL

## 2024-04-23 NOTE — TOC Progression Note (Addendum)
 Transition of Care [name]) - Progression Note    Patient Details  Name: Takuya Lariccia MRN: [id number] Date of Birth: 09-29-1975  Transition of Care [facility]) CM/SW Contact  Lendia Dais, [place] Phone Number: 04/23/2024, 10:02 AM  Clinical Narrative:  Barriers still remain in place for placement d/t trach/vent. CSW left a VM for Lorie, disability [facility] [phone]. [phone].  ICM will continue to follow.    Expected Discharge Plan: Long Term Nursing Home Barriers to Discharge: Continued Medical Work up, SNF Pending Medicaid, SNF Pending bed offer, SNF Pending payor source - [facility], Inadequate or no insurance (New trach)               Expected Discharge Plan and Services In-house Referral: Clinical Social Work, Hospice / Palliative Care   Post Acute Care Choice: Skilled Nursing Facility Living arrangements for the past 2 months: Single Family Home                                       Social Drivers of Health (SDOH) Interventions SDOH Screenings   Food Insecurity: Patient Unable To Answer (10/29/2023)  Social Connections: Unknown (06/23/2023)   Received from [facility]  Tobacco Use: High Risk (10/31/2023)    Readmission Risk Interventions     No data to display

## 2024-04-23 NOTE — Progress Notes (Signed)
 PROGRESS NOTE    Garrett [name]  FMW:[id number] DOB: 1975/10/17 DOA: 10/27/2023 PCP: Pcp, No   Brief Narrative:  49 y/o man who was found down at home. PMHx significant for HTN.   Patient was down for an unknown amount of time, found by his fiance when she came home from work, reportedly with agonal breathing. Patient had driven her to work the morning of admission.  He has HTN but never checks his BP and does not follow with MD. Patient's fiance states that she can tell when his BP is really high; his eyes get blood shot and he is sweating. No h/o seizures. Besides almost daily marijuana and cigarette smoking, she is not aware of any other drug use. Drug screen positive for opioids and he was given several doses of Narcan . 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 BP in ED 262/156. CXR revealed RUL and perihilar infiltrates suggesting aspiration pneumonia. ED labs also revealed PH 7.169, LA 4.4, CPK 985. Patient was intubated in ED.   PCCM consulted.   Pertinent Medical History:  Hypertension   Significant Hospital Events: Including procedures, antibiotic start and stop dates in addition to other pertinent events   3/08 - Intubated in ED, pontine bleed/SAH. CT head 17:09 Acute large hemorrhage 4.1 cm in pons with intraventricular extension without hydrocephalus 3/09 - CTA 03:14 AM No change in IPH in pons, similar biparietal [facility] 3/14 - Now awake, appears locked in can follow commands with eyes. 3/16 - Purposeful with left upper extremity  3/22 - Tracheostomy placed at bedside, bleeding issues overnight from trach site 3/24 - PEG (Dr. Sebastian) 3/24 - Sputum culture with MRSA 3/27 - Vomited. TF on hold. Abd film c/w ileus. Added reglan , got SSE. Did tolerate PSV all day   3/28 - BMs x2 after SSE day prior. Added back TFs at 1/2 rated. Tolerated PS 3/29 - Tolerated PS  3/31 - Tolerating CPAP PS 15/5, TF on hold due to ileus. 4/01 - Con't to hold tube feeds today, restarting vancomycin  and sending tracheal aspirate as peaks are 37, plat 24, driving 19. Thick secretions. Fever overnight.  4/02 - Peaks improved. Ongoing hiccups. Trickle feeding. Neuro exam unchanged.  4/20 - Trach was changed to cuffless #8 4/28 - Not safe to swallow. Limited communication and he follows simple commands. On TF 5/01 - On TC 28%, with large secretions. Working with speech and he is improving to initiate PMV trials under ST supervision   5/05 - On TC 30%, 7 L/min. Trach #6 cuffless, changed 5/1 to facilitate PMV trials  5/12 - On TC 21%, 6 L/min. Trach #6 cuffless, unable to produce sounds or speak on PMV. No significant resp secretions. On PEG TF 6/10 - Tracheostomy was removed due to failure to well provide adequate airway 02/15/2024 pulmonary critical care consult. 7/07 Patient obtunded and desaturating on NRB; transfer to icu for intubation; updated mother over phone who wants to reverse code status to full code; ent consulted 7/09 Remains intubated on no continuous sedation, right eye open but does not track movement  7/16 Revision ENT trach with [facility] Flap 7/18 Weaned on vent 5 hours  7/19 Tolerated trach collar for the majority of the day but had some increased work of breath resulting in being placed back on vent support  7/20 No issues overnight,  back on ATC this AM 7/21 Did not tolerate ATC. Placed back on PRVC. 7/24 ATC until afternoon and went on vent for WOB reportedly  7/25 ATC for a few hours  7/26 ATC x 6h 7/28 tolerating trach collar-tolerated for most of the day 7/30 on vent overnight and back on trach collar today 8/1 back on vent overnight  8/2 was able to maintain off of ventilator overnight but by 6 AM today patient was seen with signs of increased work of  breathing including tachycardia therefore patient was placed back on ventilator 8/3 remained on ventilator overnight to transition to trach collar this a.m. 8/4 did 18 hour ATC yesterday; back on vent 3am today 8/5 TC for 12-14 hours. Then went back to vent because of tachypnea.  8/6 tolerated ATC at 30% for 24 hours. Back on vent this AM for rest as was a bit tachypneic 8/11 been off vent >24 hours. 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  8/12-off vent for over 48 hours 8/18 completed abx  8/19 Changed to cuffless trach 8/20 Increasing tachypnea, hypercarbia.  Trach changed back to #6 cuffed and brought back to ICU to be placed on ventilator.  Antibiotics broadened to linezolid  and cefepime  8/23 PSV wean 8/24 eeg for twitching no sz 8/26 started TC 8/27 did TC overnight, hypercarbic on repeat gas but compensated 04/18/2024: Last seen by ophthalmology. 04/19/2024: Transferred under [facility].  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 Not a candidate for decannulation given ineffective airway clearance Now with worsening respiratory distress, hypercarbia with concern for new HAP 8/20 -  repeat ABG 8/27 am after 24hrs of TC> 7.357/ 58.6/ 96/ 33 cefepime  completed 8/25, trach asp 8/20 neg. Plan: - cont TC during day with full MV/ PRVC at night, now suspected to be night time vent dependent given recurrent compensated hypercarbia - VAP/ H2B - trach care per protocol - monitor clinically off abx, weekly CBC  - cont BD.  All management per PCCM.   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  Reassessed by ophthalmology again on 04/18/2024, recommendations to continue erythromycin  eye drops q2 per Optho.  Recs to tape eyes with paper tape if remain open without blinking and to notify optho if worsening redness or opacification of right eye.  However on my examination, patient appears to have keratopathy of the left eye.  I presume that the lateralization of the eye is just an error in ophthalmology note.   HTN Blood pressure controlled, continue scheduled amlodipine , Lopressor  and as needed hydralazine .   DVT prophylaxis: enoxaparin  (LOVENOX ) injection 40 mg Start: 03/06/24 1000 SCDs Start: 10/27/23 2226   Code Status: Full Code  Family  Communication:  None present at bedside.   Status is: Inpatient Remains inpatient appropriate because: Pending insurance authorization   Estimated body mass index is 44.92 kg/m as calculated from the following:   Height as of this encounter: 5' 8 (1.727 m).   Weight as of this encounter: 134 kg.    Nutritional Assessment: Body mass index is 44.92 kg/m.SABRA Seen by dietician.  I agree with the assessment and plan as outlined below: Nutrition Status: Nutrition Problem: Inadequate oral intake Etiology: inability to eat Signs/Symptoms: NPO status Interventions: Tube feeding, Prostat, MVI, Juven  . Skin Assessment: I have examined the patient's skin and I agree with the wound assessment as performed by the wound care RN as outlined below:    Consultants:  Critical care and  ophthalmology  Procedures:  Above  Antimicrobials:  Anti-infectives (From admission, onward)    Start     Dose/Rate Route Frequency Ordered Stop   04/09/24 2200  linezolid  (ZYVOX ) tablet 600 mg  Status:  Discontinued        600 mg Per Tube Every 12 hours 04/09/24 1132 04/13/24 1332   04/09/24 1700  vancomycin  (VANCOREADY) IVPB 1500 mg/300 mL  Status:  Discontinued        1,500 mg 150 mL/hr over 120 Minutes Intravenous Every 12 hours 04/09/24 1108 04/09/24 1132   04/09/24 1000  linezolid  (ZYVOX ) IVPB 600 mg  Status:  Discontinued        600 mg 300 mL/hr over 60 Minutes Intravenous Every 12 hours 04/09/24 0527 04/09/24 1108   04/01/24 1145  ceFEPIme  (MAXIPIME ) 2 g in sodium chloride  0.9 % 100 mL IVPB        2 g 200 mL/hr over 30 Minutes Intravenous Every 8 hours 04/01/24 1137 04/14/24 2230   02/21/24 1000  vancomycin  (VANCOREADY) IVPB 1250 mg/250 mL        1,250 mg 166.7 mL/hr over 90 Minutes Intravenous Every 12 hours 02/20/24 2158 02/28/24 0130   02/16/24 1445  ceFEPIme  (MAXIPIME ) 2 g in sodium chloride  0.9 % 100 mL IVPB        2 g 200 mL/hr over 30 Minutes Intravenous Every 8 hours 02/16/24 1348 02/27/24 2322   02/16/24 0945  vancomycin  (VANCOREADY) IVPB 1500 mg/300 mL  Status:  Discontinued        1,500 mg 150 mL/hr over 120 Minutes Intravenous Every 12 hours 02/16/24 0848 02/20/24 2158   02/15/24 1215  Ampicillin -Sulbactam (UNASYN ) 3 g in sodium chloride  0.9 % 100 mL IVPB  Status:  Discontinued        3 g 200 mL/hr over 30 Minutes Intravenous Every 6 hours 02/15/24 1125 02/16/24 1226   01/24/24 2000  vancomycin  (VANCOREADY) IVPB 1250 mg/250 mL        1,250 mg 166.7 mL/hr over 90 Minutes Intravenous Every 24 hours 01/24/24 1154 01/27/24 0125   01/22/24 2200  Vancomycin  (VANCOCIN ) 1,250 mg in sodium chloride  0.9 % 250 mL IVPB  Status:  Discontinued        1,250 mg 166.7 mL/hr over 90 Minutes Intravenous Every 24 hours 01/22/24 1013 01/22/24 1020   01/22/24 2000  vancomycin   (VANCOREADY) IVPB 1250 mg/250 mL  Status:  Discontinued        1,250 mg 166.7 mL/hr over 90 Minutes Intravenous Every 24 hours 01/22/24 1020 01/24/24 1154   01/20/24 2000  ceFEPIme  (MAXIPIME ) 2 g in sodium chloride  0.9 % 100 mL IVPB  Status:  Discontinued        2 g 200 mL/hr  over 30 Minutes Intravenous Every 8 hours 01/20/24 1900 01/24/24 1154   01/20/24 2000  Vancomycin  (VANCOCIN ) 1,250 mg in sodium chloride  0.9 % 250 mL IVPB  Status:  Discontinued        1,250 mg 166.7 mL/hr over 90 Minutes Intravenous Every 24 hours 01/20/24 1900 01/22/24 1013   01/03/24 1730  cefTRIAXone  (ROCEPHIN ) 1 g in sodium chloride  0.9 % 100 mL IVPB        1 g 200 mL/hr over 30 Minutes Intravenous Every 24 hours 01/03/24 1235 01/07/24 0710   01/02/24 1830  ceFEPIme  (MAXIPIME ) 2 g in sodium chloride  0.9 % 100 mL IVPB  Status:  Discontinued        2 g 200 mL/hr over 30 Minutes Intravenous Every 8 hours 01/02/24 1732 01/03/24 1235   01/02/24 1830  Vancomycin  (VANCOCIN ) 1,250 mg in sodium chloride  0.9 % 250 mL IVPB  Status:  Discontinued        1,250 mg 166.7 mL/hr over 90 Minutes Intravenous Every 24 hours 01/02/24 1732 01/04/24 1754   01/01/24 1200  cefTRIAXone  (ROCEPHIN ) 1 g in sodium chloride  0.9 % 100 mL IVPB  Status:  Discontinued        1 g 200 mL/hr over 30 Minutes Intravenous Every 24 hours 01/01/24 1057 01/02/24 1713   11/29/23 2200  linezolid  (ZYVOX ) tablet 600 mg        600 mg Per Tube Every 12 hours 11/29/23 1034 12/04/23 2215   11/28/23 1000  linezolid  (ZYVOX ) IVPB 600 mg  Status:  Discontinued        600 mg 300 mL/hr over 60 Minutes Intravenous Every 12 hours 11/28/23 0831 11/29/23 1034   11/23/23 1200  vancomycin  (VANCOREADY) IVPB 1250 mg/250 mL  Status:  Discontinued        1,250 mg 166.7 mL/hr over 90 Minutes Intravenous Every 24 hours 11/23/23 1036 11/28/23 0831   11/20/23 1100  vancomycin  (VANCOREADY) IVPB 1250 mg/250 mL  Status:  Discontinued        1,250 mg 166.7 mL/hr over 90 Minutes  Intravenous Every 24 hours 11/20/23 0923 11/20/23 0923   11/20/23 1100  vancomycin  (VANCOREADY) IVPB 1250 mg/250 mL  Status:  Discontinued        1,250 mg 166.7 mL/hr over 90 Minutes Intravenous Every 24 hours 11/20/23 0923 11/23/23 1036   11/15/23 2300  vancomycin  (VANCOREADY) IVPB 1250 mg/250 mL        1,250 mg 166.7 mL/hr over 90 Minutes Intravenous Every 24 hours 11/15/23 2215 11/19/23 0924   11/12/23 2200  vancomycin  (VANCOREADY) IVPB 750 mg/150 mL  Status:  Discontinued        750 mg 150 mL/hr over 60 Minutes Intravenous Every 12 hours 11/12/23 0855 11/15/23 2215   11/12/23 0930  vancomycin  (VANCOREADY) IVPB 2000 mg/400 mL        2,000 mg 200 mL/hr over 120 Minutes Intravenous  Once 11/12/23 0838 11/12/23 1116   11/11/23 1345  Ampicillin -Sulbactam (UNASYN ) 3 g in sodium chloride  0.9 % 100 mL IVPB  Status:  Discontinued        3 g 200 mL/hr over 30 Minutes Intravenous Every 6 hours 11/11/23 1257 11/12/23 0836   10/28/23 0400  Ampicillin -Sulbactam (UNASYN ) 3 g in sodium chloride  0.9 % 100 mL IVPB  Status:  Discontinued        3 g 200 mL/hr over 30 Minutes Intravenous Every 6 hours 10/27/23 2151 11/02/23 0945   10/27/23 1845  vancomycin  (VANCOCIN ) IVPB 1000 mg/200 mL premix  Status:  Discontinued        1,000 mg 200 mL/hr over 60 Minutes Intravenous  Once 10/27/23 1843 10/27/23 1843   10/27/23 1845  ceFEPIme  (MAXIPIME ) 2 g in sodium chloride  0.9 % 100 mL IVPB        2 g 200 mL/hr over 30 Minutes Intravenous  Once 10/27/23 1843 10/27/23 2024   10/27/23 1845  vancomycin  (VANCOREADY) IVPB 2000 mg/400 mL        2,000 mg 200 mL/hr over 120 Minutes Intravenous  Once 10/27/23 1843 10/27/23 2123         Subjective: Patient seen and examined.  Status quo.  Objective: Vitals:   04/23/24 0700 04/23/24 0725 04/23/24 0800 04/23/24 0900  BP: 93/69  101/72 117/78  Pulse: 69 66 68 82  Resp: 12 12 12  (!) 28  Temp:  97.8 F (36.6 C)    TempSrc:  Axillary    SpO2: 100% 100% 100% 97%   Weight:  134 kg    Height:        Intake/Output Summary (Last 24 hours) at 04/23/2024 1046 Last data filed at 04/23/2024 0725 Gross per 24 hour  Intake 400 ml  Output 900 ml  Net -500 ml   Filed Weights   04/16/24 0500 04/17/24 0318 04/23/24 0725  Weight: 135 kg 130 kg 134 kg    Examination:  General exam: Appears calm and comfortable  Respiratory system: Has tracheostomy. Cardiovascular system: S1 & S2 heard, RRR. No JVD, murmurs, rubs, gallops or clicks. No pedal edema. Gastrointestinal system: Abdomen is nondistended, soft and nontender. No organomegaly or masses felt. Normal bowel sounds heard. Central nervous system: Not responding to any commands, not oriented,  Data Reviewed: I have personally reviewed following labs and imaging studies  CBC: Recent Labs  Lab 04/21/24 0746  WBC 6.9  HGB 10.6*  HCT 34.9*  MCV 89.3  PLT 409*   Basic Metabolic Panel: Recent Labs  Lab 04/18/24 0806 04/20/24 1047 04/22/24 0838  NA 138 140 143  K 3.8 4.3 3.7  CL 96* 99 105  CO2 30 29 25   GLUCOSE 180* 93 130*  BUN 27* 33* 34*  CREATININE 0.57* 0.53* 0.63  CALCIUM 9.7 9.7 9.5   GFR: Estimated Creatinine Clearance: 152.7 mL/min (by C-G formula based on SCr of 0.63 mg/dL). Liver Function Tests: No results for input(s): AST, ALT, ALKPHOS, BILITOT, PROT, ALBUMIN  in the last 168 hours. No results for input(s): LIPASE, AMYLASE in the last 168 hours. No results for input(s): AMMONIA in the last 168 hours. Coagulation Profile: No results for input(s): INR, PROTIME in the last 168 hours. Cardiac Enzymes: No results for input(s): CKTOTAL, CKMB, CKMBINDEX, TROPONINI in the last 168 hours. BNP (last 3 results) No results for input(s): PROBNP in the last 8760 hours. HbA1C: No results for input(s): HGBA1C in the last 72 hours. CBG: Recent Labs  Lab 04/22/24 1523 04/22/24 1918 04/22/24 2309 04/23/24 0313 04/23/24 0723  GLUCAP 126* 111* 135* 94  152*   Lipid Profile: No results for input(s): CHOL, HDL, LDLCALC, TRIG, CHOLHDL, LDLDIRECT in the last 72 hours. Thyroid Function Tests: No results for input(s): TSH, T4TOTAL, FREET4, T3FREE, THYROIDAB in the last 72 hours. Anemia Panel: No results for input(s): VITAMINB12, FOLATE, FERRITIN, TIBC, IRON, RETICCTPCT in the last 72 hours. Sepsis Labs: No results for input(s): PROCALCITON, LATICACIDVEN in the last 168 hours.  No results found for this or any previous visit (from the past 240 hours).   Radiology Studies: No results found.  Scheduled  Meds:  amLODipine   10 mg Per Tube Daily   [START ON 04/24/2024] Chlorhexidine  Gluconate Cloth  6 each Topical Q0600   enoxaparin  (LOVENOX ) injection  40 mg Subcutaneous Daily   erythromycin    Both Eyes Q2H   famotidine   20 mg Per Tube BID   feeding supplement (JEVITY 1.5 CAL/FIBER)  237 mL Per Tube 5 X Daily   feeding supplement (PROSource TF20)  60 mL Per Tube TID   free water   100 mL Per Tube Q6H   insulin  aspart  0-15 Units Subcutaneous Q4H   metoprolol  tartrate  75 mg Per Tube BID   multivitamin with minerals  1 tablet Per Tube Daily   mouth rinse  15 mL Mouth Rinse Q2H   polyethylene glycol  17 g Per Tube Daily   sodium chloride  flush  10-40 mL Intracatheter Q12H   Continuous Infusions:   LOS: 179 days   Fredia Skeeter, MD [facility]  04/23/2024, 10:46 AM   *Please note that this is a verbal dictation therefore any spelling or grammatical errors are due to the Dragon Medical One system interpretation.  Please page via Amion and do not message via secure chat for urgent patient care matters. Secure chat can be used for non urgent patient care matters.  How to contact the [facility] Attending or Consulting provider 7A - 7P or covering provider during after hours 7P -7A, for this patient?  Check the care team in [facility] and look for a) attending/consulting [facility] provider listed and b) the [facility] team  listed. Page or secure chat 7A-7P. Log into [email or web address] and use [place]'s universal password to access. If you do not have the password, please contact the hospital operator. Locate the [facility] provider you are looking for under [facility] and page to a number that you can be directly reached. If you still have difficulty reaching the provider, please page the [facility] (Director on Call) for the Hospitalists listed on amion for assistance.

## 2024-04-23 NOTE — Plan of Care (Signed)
 Problem: Intracerebral Hemorrhage Tissue Perfusion: Goal: Complications of Intracerebral Hemorrhage will be minimized Outcome: Not Progressing   Problem: Nutrition: Goal: Risk of aspiration will decrease Outcome: Not Progressing Goal: Dietary intake will improve Outcome: Not Progressing   Problem: Fluid Volume: Goal: Ability to maintain a balanced intake and output will improve Outcome: Not Progressing   Problem: Skin Integrity: Goal: Risk for impaired skin integrity will decrease Outcome: Not Progressing   Problem: Tissue Perfusion: Goal: Adequacy of tissue perfusion will improve Outcome: Not Progressing   Problem: Clinical Measurements: Goal: Ability to maintain clinical measurements within normal limits will improve Outcome: Not Progressing Goal: Will remain free from infection Outcome: Not Progressing Goal: Diagnostic test results will improve Outcome: Not Progressing Goal: Respiratory complications will improve Outcome: Not Progressing Goal: Cardiovascular complication will be avoided Outcome: Not Progressing   Problem: Activity: Goal: Risk for activity intolerance will decrease Outcome: Not Progressing   Problem: Nutrition: Goal: Adequate nutrition will be maintained Outcome: Not Progressing   Problem: Elimination: Goal: Will not experience complications related to bowel motility Outcome: Not Progressing Goal: Will not experience complications related to urinary retention Outcome: Not Progressing   Problem: Pain Managment: Goal: General experience of comfort will improve and/or be controlled Outcome: Not Progressing   Problem: Safety: Goal: Ability to remain free from injury will improve Outcome: Not Progressing   Problem: Skin Integrity: Goal: Risk for impaired skin integrity will decrease Outcome: Not Progressing   Problem: Activity: Goal: Ability to tolerate increased activity will improve Outcome: Not Progressing   Problem:  Respiratory: Goal: Patent airway maintenance will improve Outcome: Not Progressing   Problem: Activity: Goal: Ability to tolerate increased activity will improve Outcome: Not Progressing   Problem: Respiratory: Goal: Ability to maintain a clear airway and adequate ventilation will improve Outcome: Not Progressing   Problem: Role Relationship: Goal: Method of communication will improve Outcome: Not Progressing   Problem: Education: Goal: Knowledge of disease or condition will improve Outcome: Not Progressing   Problem: Intracerebral Hemorrhage Tissue Perfusion: Goal: Complications of Intracerebral Hemorrhage will be minimized Outcome: Not Progressing   Problem: Self-Care: Goal: Ability to participate in self-care as condition permits will improve Outcome: Not Progressing Goal: Verbalization of feelings and concerns over difficulty with self-care will improve Outcome: Not Progressing Goal: Ability to communicate needs accurately will improve Outcome: Not Progressing   Problem: Nutrition: Goal: Risk of aspiration will decrease Outcome: Not Progressing Goal: Dietary intake will improve Outcome: Not Progressing   Problem: Education: Goal: Knowledge of disease or condition will improve Outcome: Not Progressing Goal: Knowledge of secondary prevention will improve (MUST DOCUMENT ALL) Outcome: Not Progressing Goal: Knowledge of patient specific risk factors will improve (DELETE if not current risk factor) Outcome: Not Progressing   Problem: Intracerebral Hemorrhage Tissue Perfusion: Goal: Complications of Intracerebral Hemorrhage will be minimized Outcome: Not Progressing   Problem: Coping: Goal: Will verbalize positive feelings about self Outcome: Not Progressing Goal: Will identify appropriate support needs Outcome: Not Progressing   Problem: Health Behavior/Discharge Planning: Goal: Ability to manage health-related needs will improve Outcome: Not Progressing Goal:  Goals will be collaboratively established with patient/family Outcome: Not Progressing   Problem: Self-Care: Goal: Ability to participate in self-care as condition permits will improve Outcome: Not Progressing Goal: Verbalization of feelings and concerns over difficulty with self-care will improve Outcome: Not Progressing Goal: Ability to communicate needs accurately will improve Outcome: Not Progressing   Problem: Nutrition: Goal: Risk of aspiration will decrease Outcome: Not Progressing Goal: Dietary intake will improve Outcome:  Not Progressing

## 2024-04-23 NOTE — Plan of Care (Signed)
 Problem: Intracerebral Hemorrhage Tissue Perfusion: Goal: Complications of Intracerebral Hemorrhage will be minimized 04/23/2024 1735 by Garrett [name] LABOR, RN Outcome: Not Progressing 04/23/2024 1727 by Garrett [name] LABOR, RN Outcome: Not Progressing   Problem: Nutrition: Goal: Risk of aspiration will decrease 04/23/2024 1735 by Garrett [name] LABOR, RN Outcome: Not Progressing 04/23/2024 1727 by Garrett [name] LABOR, RN Outcome: Not Progressing Goal: Dietary intake will improve 04/23/2024 1735 by Garrett [name] LABOR, RN Outcome: Not Progressing 04/23/2024 1727 by Garrett [name] LABOR, RN Outcome: Not Progressing   Problem: Fluid Volume: Goal: Ability to maintain a balanced intake and output will improve 04/23/2024 1735 by Garrett [name] LABOR, RN Outcome: Not Progressing 04/23/2024 1727 by Garrett [name] LABOR, RN Outcome: Not Progressing   Problem: Skin Integrity: Goal: Risk for impaired skin integrity will decrease 04/23/2024 1735 by Garrett [name] LABOR, RN Outcome: Not Progressing 04/23/2024 1727 by Garrett [name] LABOR, RN Outcome: Not Progressing   Problem: Tissue Perfusion: Goal: Adequacy of tissue perfusion will improve 04/23/2024 1735 by Garrett [name] LABOR, RN Outcome: Not Progressing 04/23/2024 1727 by Garrett [name] LABOR, RN Outcome: Not Progressing   Problem: Clinical Measurements: Goal: Ability to maintain clinical measurements within normal limits will improve 04/23/2024 1735 by Garrett [name] LABOR, RN Outcome: Not Progressing 04/23/2024 1727 by Garrett [name] LABOR, RN Outcome: Not Progressing Goal: Will remain free from infection 04/23/2024 1735 by Garrett [name] LABOR, RN Outcome: Not Progressing 04/23/2024 1727 by Garrett [name] LABOR, RN Outcome: Not Progressing Goal: Diagnostic test results will improve 04/23/2024 1735 by Garrett [name] LABOR, RN Outcome: Not Progressing 04/23/2024 1727 by Garrett [name] LABOR, RN Outcome: Not Progressing Goal: Respiratory complications will improve 04/23/2024 1735 by Garrett [name] LABOR, RN Outcome: Not  Progressing 04/23/2024 1727 by Garrett [name] LABOR, RN Outcome: Not Progressing Goal: Cardiovascular complication will be avoided 04/23/2024 1735 by Garrett [name] LABOR, RN Outcome: Not Progressing 04/23/2024 1727 by Garrett [name] LABOR, RN Outcome: Not Progressing   Problem: Activity: Goal: Risk for activity intolerance will decrease 04/23/2024 1735 by Garrett [name] LABOR, RN Outcome: Not Progressing 04/23/2024 1727 by Garrett [name] LABOR, RN Outcome: Not Progressing   Problem: Nutrition: Goal: Adequate nutrition will be maintained 04/23/2024 1735 by Garrett [name] LABOR, RN Outcome: Not Progressing 04/23/2024 1727 by Garrett [name] LABOR, RN Outcome: Not Progressing   Problem: Elimination: Goal: Will not experience complications related to bowel motility 04/23/2024 1735 by Garrett [name] LABOR, RN Outcome: Not Progressing 04/23/2024 1727 by Garrett [name] LABOR, RN Outcome: Not Progressing Goal: Will not experience complications related to urinary retention 04/23/2024 1735 by Garrett [name] LABOR, RN Outcome: Not Progressing 04/23/2024 1727 by Garrett [name] LABOR, RN Outcome: Not Progressing   Problem: Pain Managment: Goal: General experience of comfort will improve and/or be controlled 04/23/2024 1735 by Garrett [name] LABOR, RN Outcome: Not Progressing 04/23/2024 1727 by Garrett [name] LABOR, RN Outcome: Not Progressing   Problem: Safety: Goal: Ability to remain free from injury will improve 04/23/2024 1735 by Garrett [name] LABOR, RN Outcome: Not Progressing 04/23/2024 1727 by Garrett [name] LABOR, RN Outcome: Not Progressing   Problem: Skin Integrity: Goal: Risk for impaired skin integrity will decrease 04/23/2024 1735 by Garrett [name] LABOR, RN Outcome: Not Progressing 04/23/2024 1727 by Garrett [name] LABOR, RN Outcome: Not Progressing   Problem: Activity: Goal: Ability to tolerate increased activity will improve 04/23/2024 1735 by Garrett [name] LABOR, RN Outcome: Not Progressing 04/23/2024 1727 by Garrett [name] LABOR, RN Outcome: Not Progressing   Problem:  Respiratory: Goal: Patent airway maintenance will improve 04/23/2024 1735 by Garrett,  [name] LABOR, RN Outcome: Not Progressing 04/23/2024 1727 by Garrett [name] LABOR, RN Outcome: Not Progressing   Problem: Activity: Goal: Ability to tolerate increased activity will improve Outcome: Not Progressing   Problem: Respiratory: Goal: Ability to maintain a clear airway and adequate ventilation will improve Outcome: Not Progressing   Problem: Role Relationship: Goal: Method of communication will improve Outcome: Not Progressing   Problem: Education: Goal: Knowledge of disease or condition will improve 04/23/2024 1735 by Garrett [name] LABOR, RN Outcome: Not Progressing 04/23/2024 1727 by Garrett [name] LABOR, RN Outcome: Not Progressing   Problem: Intracerebral Hemorrhage Tissue Perfusion: Goal: Complications of Intracerebral Hemorrhage will be minimized 04/23/2024 1735 by Garrett [name] LABOR, RN Outcome: Not Progressing 04/23/2024 1727 by Garrett [name] LABOR, RN Outcome: Not Progressing   Problem: Self-Care: Goal: Ability to participate in self-care as condition permits will improve 04/23/2024 1735 by Garrett [name] LABOR, RN Outcome: Not Progressing 04/23/2024 1727 by Garrett [name] LABOR, RN Outcome: Not Progressing Goal: Verbalization of feelings and concerns over difficulty with self-care will improve 04/23/2024 1735 by Garrett [name] LABOR, RN Outcome: Not Progressing 04/23/2024 1727 by Garrett [name] LABOR, RN Outcome: Not Progressing Goal: Ability to communicate needs accurately will improve 04/23/2024 1735 by Garrett [name] LABOR, RN Outcome: Not Progressing 04/23/2024 1727 by Garrett [name] LABOR, RN Outcome: Not Progressing   Problem: Nutrition: Goal: Risk of aspiration will decrease 04/23/2024 1735 by Garrett [name] LABOR, RN Outcome: Not Progressing 04/23/2024 1727 by Garrett [name] LABOR, RN Outcome: Not Progressing Goal: Dietary intake will improve 04/23/2024 1735 by Garrett [name] LABOR, RN Outcome: Not Progressing 04/23/2024 1727 by Garrett [name] LABOR,  RN Outcome: Not Progressing   Problem: Education: Goal: Knowledge of disease or condition will improve 04/23/2024 1735 by Garrett [name] LABOR, RN Outcome: Not Progressing 04/23/2024 1727 by Garrett [name] LABOR, RN Outcome: Not Progressing Goal: Knowledge of secondary prevention will improve (MUST DOCUMENT ALL) 04/23/2024 1735 by Garrett [name] LABOR, RN Outcome: Not Progressing 04/23/2024 1727 by Garrett [name] LABOR, RN Outcome: Not Progressing Goal: Knowledge of patient specific risk factors will improve (DELETE if not current risk factor) 04/23/2024 1735 by Garrett [name] LABOR, RN Outcome: Not Progressing 04/23/2024 1727 by Garrett [name] LABOR, RN Outcome: Not Progressing   Problem: Intracerebral Hemorrhage Tissue Perfusion: Goal: Complications of Intracerebral Hemorrhage will be minimized 04/23/2024 1735 by Garrett [name] LABOR, RN Outcome: Not Progressing 04/23/2024 1727 by Garrett [name] LABOR, RN Outcome: Not Progressing   Problem: Coping: Goal: Will verbalize positive feelings about self 04/23/2024 1735 by Garrett [name] LABOR, RN Outcome: Not Progressing 04/23/2024 1727 by Garrett [name] LABOR, RN Outcome: Not Progressing Goal: Will identify appropriate support needs 04/23/2024 1735 by Garrett [name] LABOR, RN Outcome: Not Progressing 04/23/2024 1727 by Garrett [name] LABOR, RN Outcome: Not Progressing   Problem: Health Behavior/Discharge Planning: Goal: Ability to manage health-related needs will improve 04/23/2024 1735 by Garrett [name] LABOR, RN Outcome: Not Progressing 04/23/2024 1727 by Garrett [name] LABOR, RN Outcome: Not Progressing Goal: Goals will be collaboratively established with patient/family 04/23/2024 1735 by Garrett [name] LABOR, RN Outcome: Not Progressing 04/23/2024 1727 by Garrett [name] LABOR, RN Outcome: Not Progressing   Problem: Self-Care: Goal: Ability to participate in self-care as condition permits will improve 04/23/2024 1735 by Garrett [name] LABOR, RN Outcome: Not Progressing 04/23/2024 1727 by Garrett [name] LABOR, RN Outcome: Not  Progressing Goal: Verbalization of feelings and concerns over difficulty with self-care will improve 04/23/2024 1735 by Garrett [name] LABOR, RN Outcome: Not Progressing 04/23/2024 1727 by Garrett [name] LABOR, RN Outcome: Not  Progressing Goal: Ability to communicate needs accurately will improve 04/23/2024 1735 by Garrett [name] LABOR, RN Outcome: Not Progressing 04/23/2024 1727 by Garrett [name] LABOR, RN Outcome: Not Progressing   Problem: Nutrition: Goal: Risk of aspiration will decrease 04/23/2024 1735 by Garrett [name] LABOR, RN Outcome: Not Progressing 04/23/2024 1727 by Garrett [name] LABOR, RN Outcome: Not Progressing Goal: Dietary intake will improve 04/23/2024 1735 by Garrett [name] LABOR, RN Outcome: Not Progressing 04/23/2024 1727 by Garrett [name] LABOR, RN Outcome: Not Progressing

## 2024-04-24 DIAGNOSIS — I619 Nontraumatic intracerebral hemorrhage, unspecified: Secondary | ICD-10-CM | POA: Diagnosis not present

## 2024-04-24 LAB — GLUCOSE, CAPILLARY
Glucose-Capillary: 83 mg/dL (ref 70–99)
Glucose-Capillary: 167 mg/dL — ABNORMAL HIGH (ref 70–99)
Glucose-Capillary: 145 mg/dL — ABNORMAL HIGH (ref 70–99)
Glucose-Capillary: 78 mg/dL (ref 70–99)
Glucose-Capillary: 141 mg/dL — ABNORMAL HIGH (ref 70–99)
Glucose-Capillary: 115 mg/dL — ABNORMAL HIGH (ref 70–99)

## 2024-04-24 LAB — BASIC METABOLIC PANEL WITH GFR
Anion gap: 7 (ref 5–15)
BUN: 27 mg/dL — ABNORMAL HIGH (ref 6–20)
CO2: 33 mmol/L — ABNORMAL HIGH (ref 22–32)
Calcium: 9.6 mg/dL (ref 8.9–10.3)
Chloride: 104 mmol/L (ref 98–111)
Creatinine, Ser: 0.52 mg/dL — ABNORMAL LOW (ref 0.61–1.24)
GFR, Estimated: >60 mL/min (ref >60)
Glucose, Bld: 117 mg/dL — ABNORMAL HIGH (ref 70–99)
Potassium: 3.7 mmol/L (ref 3.5–5.1)
Sodium: 144 mmol/L (ref 135–145)

## 2024-04-24 NOTE — Plan of Care (Signed)
 Problem: Health Behavior/Discharge Planning: Goal: Goals will be collaboratively established with patient/family Outcome: Progressing   Problem: Nutrition: Goal: Dietary intake will improve Outcome: Progressing   Problem: Intracerebral Hemorrhage Tissue Perfusion: Goal: Complications of Intracerebral Hemorrhage will be minimized Outcome: Not Progressing   Problem: Nutrition: Goal: Risk of aspiration will decrease Outcome: Not Progressing Goal: Dietary intake will improve Outcome: Not Progressing   Problem: Fluid Volume: Goal: Ability to maintain a balanced intake and output will improve Outcome: Not Progressing   Problem: Skin Integrity: Goal: Risk for impaired skin integrity will decrease Outcome: Not Progressing   Problem: Tissue Perfusion: Goal: Adequacy of tissue perfusion will improve Outcome: Not Progressing   Problem: Clinical Measurements: Goal: Ability to maintain clinical measurements within normal limits will improve Outcome: Not Progressing Goal: Will remain free from infection Outcome: Not Progressing Goal: Diagnostic test results will improve Outcome: Not Progressing Goal: Respiratory complications will improve Outcome: Not Progressing Goal: Cardiovascular complication will be avoided Outcome: Not Progressing   Problem: Activity: Goal: Risk for activity intolerance will decrease Outcome: Not Progressing   Problem: Nutrition: Goal: Adequate nutrition will be maintained Outcome: Not Progressing   Problem: Elimination: Goal: Will not experience complications related to bowel motility Outcome: Not Progressing Goal: Will not experience complications related to urinary retention Outcome: Not Progressing   Problem: Pain Managment: Goal: General experience of comfort will improve and/or be controlled Outcome: Not Progressing   Problem: Safety: Goal: Ability to remain free from injury will improve Outcome: Not Progressing   Problem: Skin  Integrity: Goal: Risk for impaired skin integrity will decrease Outcome: Not Progressing   Problem: Activity: Goal: Ability to tolerate increased activity will improve Outcome: Not Progressing   Problem: Respiratory: Goal: Patent airway maintenance will improve Outcome: Not Progressing   Problem: Activity: Goal: Ability to tolerate increased activity will improve Outcome: Not Progressing   Problem: Respiratory: Goal: Ability to maintain a clear airway and adequate ventilation will improve Outcome: Not Progressing   Problem: Role Relationship: Goal: Method of communication will improve Outcome: Not Progressing   Problem: Education: Goal: Knowledge of disease or condition will improve Outcome: Not Progressing   Problem: Intracerebral Hemorrhage Tissue Perfusion: Goal: Complications of Intracerebral Hemorrhage will be minimized Outcome: Not Progressing   Problem: Self-Care: Goal: Ability to participate in self-care as condition permits will improve Outcome: Not Progressing Goal: Verbalization of feelings and concerns over difficulty with self-care will improve Outcome: Not Progressing Goal: Ability to communicate needs accurately will improve Outcome: Not Progressing   Problem: Nutrition: Goal: Risk of aspiration will decrease Outcome: Not Progressing Goal: Dietary intake will improve Outcome: Not Progressing   Problem: Education: Goal: Knowledge of disease or condition will improve Outcome: Not Progressing Goal: Knowledge of secondary prevention will improve (MUST DOCUMENT ALL) Outcome: Not Progressing Goal: Knowledge of patient specific risk factors will improve (DELETE if not current risk factor) Outcome: Not Progressing   Problem: Intracerebral Hemorrhage Tissue Perfusion: Goal: Complications of Intracerebral Hemorrhage will be minimized Outcome: Not Progressing   Problem: Coping: Goal: Will verbalize positive feelings about self Outcome: Not  Progressing Goal: Will identify appropriate support needs Outcome: Not Progressing   Problem: Health Behavior/Discharge Planning: Goal: Ability to manage health-related needs will improve Outcome: Not Progressing   Problem: Self-Care: Goal: Ability to participate in self-care as condition permits will improve Outcome: Not Progressing Goal: Verbalization of feelings and concerns over difficulty with self-care will improve Outcome: Not Progressing Goal: Ability to communicate needs accurately will improve Outcome: Not Progressing   Problem:  Nutrition: Goal: Risk of aspiration will decrease Outcome: Not Progressing

## 2024-04-24 NOTE — Progress Notes (Signed)
 PROGRESS NOTE    Garrett [name]  FMW:[id number] DOB: 05/17/1976 DOA: 10/27/2023 PCP: Pcp, No   Brief Narrative:  48 y/o man who was found down at home. PMHx significant for HTN.   Patient was down for an unknown amount of time, found by his fiance when she came home from work, reportedly with agonal breathing. Patient had driven her to work the morning of admission.  He has HTN but never checks his BP and does not follow with MD. Patient's fiance states that she can tell when his BP is really high; his eyes get blood shot and he is sweating. No h/o seizures. Besides almost daily marijuana and cigarette smoking, she is not aware of any other drug use. Drug screen positive for opioids and he was given several doses of Narcan . 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 BP in ED 262/156. CXR revealed RUL and perihilar infiltrates suggesting aspiration pneumonia. ED labs also revealed PH 7.169, LA 4.4, CPK 985. Patient was intubated in ED.   PCCM consulted.   Pertinent Medical History:  Hypertension   Significant Hospital Events: Including procedures, antibiotic start and stop dates in addition to other pertinent events   3/08 - Intubated in ED, pontine bleed/SAH. CT head 17:09 Acute large hemorrhage 4.1 cm in pons with intraventricular extension without hydrocephalus 3/09 - CTA 03:14 AM No change in IPH in pons, similar biparietal [facility] 3/14 - Now awake, appears locked in can follow commands with eyes. 3/16 - Purposeful with left upper extremity  3/22 - Tracheostomy placed at bedside, bleeding issues overnight from trach site 3/24 - PEG (Dr. Sebastian) 3/24 - Sputum culture with MRSA 3/27 - Vomited. TF on hold. Abd film c/w ileus. Added reglan , got SSE. Did tolerate PSV all day   3/28 - BMs x2 after SSE day prior. Added back TFs at 1/2 rated. Tolerated PS 3/29 - Tolerated PS  3/31 - Tolerating CPAP PS 15/5, TF on hold due to ileus. 4/01 - Con't to hold tube feeds today, restarting vancomycin  and sending tracheal aspirate as peaks are 37, plat 24, driving 19. Thick secretions. Fever overnight.  4/02 - Peaks improved. Ongoing hiccups. Trickle feeding. Neuro exam unchanged.  4/20 - Trach was changed to cuffless #8 4/28 - Not safe to swallow. Limited communication and he follows simple commands. On TF 5/01 - On TC 28%, with large secretions. Working with speech and he is improving to initiate PMV trials under ST supervision   5/05 - On TC 30%, 7 L/min. Trach #6 cuffless, changed 5/1 to facilitate PMV trials  5/12 - On TC 21%, 6 L/min. Trach #6 cuffless, unable to produce sounds or speak on PMV. No significant resp secretions. On PEG TF 6/10 - Tracheostomy was removed due to failure to well provide adequate airway 02/15/2024 pulmonary critical care consult. 7/07 Patient obtunded and desaturating on NRB; transfer to icu for intubation; updated mother over phone who wants to reverse code status to full code; ent consulted 7/09 Remains intubated on no continuous sedation, right eye open but does not track movement  7/16 Revision ENT trach with [facility] Flap 7/18 Weaned on vent 5 hours  7/19 Tolerated trach collar for the majority of the day but had some increased work of breath resulting in being placed back on vent support  7/20 No issues overnight,  back on ATC this AM 7/21 Did not tolerate ATC. Placed back on PRVC. 7/24 ATC until afternoon and went on vent for WOB reportedly  7/25 ATC for a few hours  7/26 ATC x 6h 7/28 tolerating trach collar-tolerated for most of the day 7/30 on vent overnight and back on trach collar today 8/1 back on vent overnight  8/2 was able to maintain off of ventilator overnight but by 6 AM today patient was seen with signs of increased work of  breathing including tachycardia therefore patient was placed back on ventilator 8/3 remained on ventilator overnight to transition to trach collar this a.m. 8/4 did 18 hour ATC yesterday; back on vent 3am today 8/5 TC for 12-14 hours. Then went back to vent because of tachypnea.  8/6 tolerated ATC at 30% for 24 hours. Back on vent this AM for rest as was a bit tachypneic 8/11 been off vent >24 hours. 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  8/12-off vent for over 48 hours 8/18 completed abx  8/19 Changed to cuffless trach 8/20 Increasing tachypnea, hypercarbia.  Trach changed back to #6 cuffed and brought back to ICU to be placed on ventilator.  Antibiotics broadened to linezolid  and cefepime  8/23 PSV wean 8/24 eeg for twitching no sz 8/26 started TC 8/27 did TC overnight, hypercarbic on repeat gas but compensated 04/18/2024: Last seen by ophthalmology. 04/19/2024: Transferred under [facility].  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 Not a candidate for decannulation given ineffective airway clearance Now with worsening respiratory distress, hypercarbia with concern for new HAP 8/20 -  repeat ABG 8/27 am after 24hrs of TC> 7.357/ 58.6/ 96/ 33 cefepime  completed 8/25, trach asp 8/20 neg. Plan: - cont TC during day with full MV/ PRVC at night, now suspected to be night time vent dependent given recurrent compensated hypercarbia - VAP/ H2B - trach care per protocol - monitor clinically off abx, weekly CBC  - cont BD.  All management per PCCM.   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  Reassessed by ophthalmology again on 04/18/2024, recommendations to continue erythromycin  eye drops q2 per Optho.  Recs to tape eyes with paper tape if remain open without blinking and to notify optho if worsening redness or opacification of right eye.  However on my examination, patient appears to have keratopathy of the left eye.  I presume that the lateralization of the eye is just an error in ophthalmology note.   HTN Blood pressure controlled, continue scheduled amlodipine , Lopressor  and as needed hydralazine .   DVT prophylaxis: enoxaparin  (LOVENOX ) injection 40 mg Start: 03/06/24 1000 SCDs Start: 10/27/23 2226   Code Status: Full Code  Family  Communication:  None present at bedside.   Status is: Inpatient Remains inpatient appropriate because: Pending insurance authorization   Estimated body mass index is 43.58 kg/m as calculated from the following:   Height as of this encounter: 5' 8 (1.727 m).   Weight as of this encounter: 130 kg.    Nutritional Assessment: Body mass index is 43.58 kg/m.SABRA Seen by dietician.  I agree with the assessment and plan as outlined below: Nutrition Status: Nutrition Problem: Inadequate oral intake Etiology: inability to eat Signs/Symptoms: NPO status Interventions: Tube feeding, Prostat, MVI, Juven  . Skin Assessment: I have examined the patient's skin and I agree with the wound assessment as performed by the wound care RN as outlined below:    Consultants:  Critical care and  ophthalmology  Procedures:  Above  Antimicrobials:  Anti-infectives (From admission, onward)    Start     Dose/Rate Route Frequency Ordered Stop   04/09/24 2200  linezolid  (ZYVOX ) tablet 600 mg  Status:  Discontinued        600 mg Per Tube Every 12 hours 04/09/24 1132 04/13/24 1332   04/09/24 1700  vancomycin  (VANCOREADY) IVPB 1500 mg/300 mL  Status:  Discontinued        1,500 mg 150 mL/hr over 120 Minutes Intravenous Every 12 hours 04/09/24 1108 04/09/24 1132   04/09/24 1000  linezolid  (ZYVOX ) IVPB 600 mg  Status:  Discontinued        600 mg 300 mL/hr over 60 Minutes Intravenous Every 12 hours 04/09/24 0527 04/09/24 1108   04/01/24 1145  ceFEPIme  (MAXIPIME ) 2 g in sodium chloride  0.9 % 100 mL IVPB        2 g 200 mL/hr over 30 Minutes Intravenous Every 8 hours 04/01/24 1137 04/14/24 2230   02/21/24 1000  vancomycin  (VANCOREADY) IVPB 1250 mg/250 mL        1,250 mg 166.7 mL/hr over 90 Minutes Intravenous Every 12 hours 02/20/24 2158 02/28/24 0130   02/16/24 1445  ceFEPIme  (MAXIPIME ) 2 g in sodium chloride  0.9 % 100 mL IVPB        2 g 200 mL/hr over 30 Minutes Intravenous Every 8 hours 02/16/24 1348 02/27/24 2322   02/16/24 0945  vancomycin  (VANCOREADY) IVPB 1500 mg/300 mL  Status:  Discontinued        1,500 mg 150 mL/hr over 120 Minutes Intravenous Every 12 hours 02/16/24 0848 02/20/24 2158   02/15/24 1215  Ampicillin -Sulbactam (UNASYN ) 3 g in sodium chloride  0.9 % 100 mL IVPB  Status:  Discontinued        3 g 200 mL/hr over 30 Minutes Intravenous Every 6 hours 02/15/24 1125 02/16/24 1226   01/24/24 2000  vancomycin  (VANCOREADY) IVPB 1250 mg/250 mL        1,250 mg 166.7 mL/hr over 90 Minutes Intravenous Every 24 hours 01/24/24 1154 01/27/24 0125   01/22/24 2200  Vancomycin  (VANCOCIN ) 1,250 mg in sodium chloride  0.9 % 250 mL IVPB  Status:  Discontinued        1,250 mg 166.7 mL/hr over 90 Minutes Intravenous Every 24 hours 01/22/24 1013 01/22/24 1020   01/22/24 2000  vancomycin   (VANCOREADY) IVPB 1250 mg/250 mL  Status:  Discontinued        1,250 mg 166.7 mL/hr over 90 Minutes Intravenous Every 24 hours 01/22/24 1020 01/24/24 1154   01/20/24 2000  ceFEPIme  (MAXIPIME ) 2 g in sodium chloride  0.9 % 100 mL IVPB  Status:  Discontinued        2 g 200 mL/hr  over 30 Minutes Intravenous Every 8 hours 01/20/24 1900 01/24/24 1154   01/20/24 2000  Vancomycin  (VANCOCIN ) 1,250 mg in sodium chloride  0.9 % 250 mL IVPB  Status:  Discontinued        1,250 mg 166.7 mL/hr over 90 Minutes Intravenous Every 24 hours 01/20/24 1900 01/22/24 1013   01/03/24 1730  cefTRIAXone  (ROCEPHIN ) 1 g in sodium chloride  0.9 % 100 mL IVPB        1 g 200 mL/hr over 30 Minutes Intravenous Every 24 hours 01/03/24 1235 01/07/24 0710   01/02/24 1830  ceFEPIme  (MAXIPIME ) 2 g in sodium chloride  0.9 % 100 mL IVPB  Status:  Discontinued        2 g 200 mL/hr over 30 Minutes Intravenous Every 8 hours 01/02/24 1732 01/03/24 1235   01/02/24 1830  Vancomycin  (VANCOCIN ) 1,250 mg in sodium chloride  0.9 % 250 mL IVPB  Status:  Discontinued        1,250 mg 166.7 mL/hr over 90 Minutes Intravenous Every 24 hours 01/02/24 1732 01/04/24 1754   01/01/24 1200  cefTRIAXone  (ROCEPHIN ) 1 g in sodium chloride  0.9 % 100 mL IVPB  Status:  Discontinued        1 g 200 mL/hr over 30 Minutes Intravenous Every 24 hours 01/01/24 1057 01/02/24 1713   11/29/23 2200  linezolid  (ZYVOX ) tablet 600 mg        600 mg Per Tube Every 12 hours 11/29/23 1034 12/04/23 2215   11/28/23 1000  linezolid  (ZYVOX ) IVPB 600 mg  Status:  Discontinued        600 mg 300 mL/hr over 60 Minutes Intravenous Every 12 hours 11/28/23 0831 11/29/23 1034   11/23/23 1200  vancomycin  (VANCOREADY) IVPB 1250 mg/250 mL  Status:  Discontinued        1,250 mg 166.7 mL/hr over 90 Minutes Intravenous Every 24 hours 11/23/23 1036 11/28/23 0831   11/20/23 1100  vancomycin  (VANCOREADY) IVPB 1250 mg/250 mL  Status:  Discontinued        1,250 mg 166.7 mL/hr over 90 Minutes  Intravenous Every 24 hours 11/20/23 0923 11/20/23 0923   11/20/23 1100  vancomycin  (VANCOREADY) IVPB 1250 mg/250 mL  Status:  Discontinued        1,250 mg 166.7 mL/hr over 90 Minutes Intravenous Every 24 hours 11/20/23 0923 11/23/23 1036   11/15/23 2300  vancomycin  (VANCOREADY) IVPB 1250 mg/250 mL        1,250 mg 166.7 mL/hr over 90 Minutes Intravenous Every 24 hours 11/15/23 2215 11/19/23 0924   11/12/23 2200  vancomycin  (VANCOREADY) IVPB 750 mg/150 mL  Status:  Discontinued        750 mg 150 mL/hr over 60 Minutes Intravenous Every 12 hours 11/12/23 0855 11/15/23 2215   11/12/23 0930  vancomycin  (VANCOREADY) IVPB 2000 mg/400 mL        2,000 mg 200 mL/hr over 120 Minutes Intravenous  Once 11/12/23 0838 11/12/23 1116   11/11/23 1345  Ampicillin -Sulbactam (UNASYN ) 3 g in sodium chloride  0.9 % 100 mL IVPB  Status:  Discontinued        3 g 200 mL/hr over 30 Minutes Intravenous Every 6 hours 11/11/23 1257 11/12/23 0836   10/28/23 0400  Ampicillin -Sulbactam (UNASYN ) 3 g in sodium chloride  0.9 % 100 mL IVPB  Status:  Discontinued        3 g 200 mL/hr over 30 Minutes Intravenous Every 6 hours 10/27/23 2151 11/02/23 0945   10/27/23 1845  vancomycin  (VANCOCIN ) IVPB 1000 mg/200 mL premix  Status:  Discontinued        1,000 mg 200 mL/hr over 60 Minutes Intravenous  Once 10/27/23 1843 10/27/23 1843   10/27/23 1845  ceFEPIme  (MAXIPIME ) 2 g in sodium chloride  0.9 % 100 mL IVPB        2 g 200 mL/hr over 30 Minutes Intravenous  Once 10/27/23 1843 10/27/23 2024   10/27/23 1845  vancomycin  (VANCOREADY) IVPB 2000 mg/400 mL        2,000 mg 200 mL/hr over 120 Minutes Intravenous  Once 10/27/23 1843 10/27/23 2123         Subjective: Patient seen and examined, remains status quo and locked in position.  Objective: Vitals:   04/24/24 0800 04/24/24 0812 04/24/24 0900 04/24/24 1000  BP: 123/82  125/86 132/89  Pulse: 69 77 71 74  Resp: 12 (!) 21 20 17   Temp:      TempSrc:      SpO2: 99% 100% 94% 100%   Weight:      Height:        Intake/Output Summary (Last 24 hours) at 04/24/2024 1113 Last data filed at 04/24/2024 1000 Gross per 24 hour  Intake 874 ml  Output 1495 ml  Net -621 ml   Filed Weights   04/17/24 0318 04/23/24 0725 04/24/24 0417  Weight: 130 kg 134 kg 130 kg    Examination:  General exam: Appears calm and comfortable  Respiratory system: Has tracheostomy. Cardiovascular system: S1 & S2 heard, RRR. No JVD, murmurs, rubs, gallops or clicks. No pedal edema. Gastrointestinal system: Abdomen is nondistended, soft and nontender. No organomegaly or masses felt. Normal bowel sounds heard. Central nervous system: Not responding to any commands, not oriented,  Data Reviewed: I have personally reviewed following labs and imaging studies  CBC: Recent Labs  Lab 04/21/24 0746  WBC 6.9  HGB 10.6*  HCT 34.9*  MCV 89.3  PLT 409*   Basic Metabolic Panel: Recent Labs  Lab 04/18/24 0806 04/20/24 1047 04/22/24 0838 04/24/24 0912  NA 138 140 143 144  K 3.8 4.3 3.7 3.7  CL 96* 99 105 104  CO2 30 29 25  33*  GLUCOSE 180* 93 130* 117*  BUN 27* 33* 34* 27*  CREATININE 0.57* 0.53* 0.63 0.52*  CALCIUM 9.7 9.7 9.5 9.6   GFR: Estimated Creatinine Clearance: 150.2 mL/min (A) (by C-G formula based on SCr of 0.52 mg/dL (L)). Liver Function Tests: No results for input(s): AST, ALT, ALKPHOS, BILITOT, PROT, ALBUMIN  in the last 168 hours. No results for input(s): LIPASE, AMYLASE in the last 168 hours. No results for input(s): AMMONIA in the last 168 hours. Coagulation Profile: No results for input(s): INR, PROTIME in the last 168 hours. Cardiac Enzymes: No results for input(s): CKTOTAL, CKMB, CKMBINDEX, TROPONINI in the last 168 hours. BNP (last 3 results) No results for input(s): PROBNP in the last 8760 hours. HbA1C: No results for input(s): HGBA1C in the last 72 hours. CBG: Recent Labs  Lab 04/23/24 1524 04/23/24 2002 04/23/24 2341  04/24/24 0313 04/24/24 0756  GLUCAP 137* 116* 143* 83 167*   Lipid Profile: No results for input(s): CHOL, HDL, LDLCALC, TRIG, CHOLHDL, LDLDIRECT in the last 72 hours. Thyroid Function Tests: No results for input(s): TSH, T4TOTAL, FREET4, T3FREE, THYROIDAB in the last 72 hours. Anemia Panel: No results for input(s): VITAMINB12, FOLATE, FERRITIN, TIBC, IRON, RETICCTPCT in the last 72 hours. Sepsis Labs: No results for input(s): PROCALCITON, LATICACIDVEN in the last 168 hours.  No results found for this or any previous visit (from the  past 240 hours).   Radiology Studies: No results found.  Scheduled Meds:  amLODipine   10 mg Per Tube Daily   Chlorhexidine  Gluconate Cloth  6 each Topical Q0600   enoxaparin  (LOVENOX ) injection  40 mg Subcutaneous Daily   erythromycin    Both Eyes Q2H   famotidine   20 mg Per Tube BID   feeding supplement (JEVITY 1.5 CAL/FIBER)  237 mL Per Tube 5 X Daily   feeding supplement (PROSource TF20)  60 mL Per Tube TID   free water   100 mL Per Tube Q6H   insulin  aspart  0-15 Units Subcutaneous Q4H   metoprolol  tartrate  75 mg Per Tube BID   multivitamin with minerals  1 tablet Per Tube Daily   mouth rinse  15 mL Mouth Rinse Q2H   polyethylene glycol  17 g Per Tube Daily   sodium chloride  flush  10-40 mL Intracatheter Q12H   Continuous Infusions:   LOS: 180 days   Fredia Skeeter, MD [facility]  04/24/2024, 11:13 AM   *Please note that this is a verbal dictation therefore any spelling or grammatical errors are due to the Dragon Medical One system interpretation.  Please page via Amion and do not message via secure chat for urgent patient care matters. Secure chat can be used for non urgent patient care matters.  How to contact the [facility] Attending or Consulting provider 7A - 7P or covering provider during after hours 7P -7A, for this patient?  Check the care team in [facility] and look for a) attending/consulting [facility]  provider listed and b) the [facility] team listed. Page or secure chat 7A-7P. Log into [email or web address] and use [place]'s universal password to access. If you do not have the password, please contact the hospital operator. Locate the [facility] provider you are looking for under [facility] and page to a number that you can be directly reached. If you still have difficulty reaching the provider, please page the [facility] (Director on Call) for the Hospitalists listed on amion for assistance.

## 2024-04-25 DIAGNOSIS — I619 Nontraumatic intracerebral hemorrhage, unspecified: Secondary | ICD-10-CM | POA: Diagnosis not present

## 2024-04-25 LAB — GLUCOSE, CAPILLARY
Glucose-Capillary: 110 mg/dL — ABNORMAL HIGH (ref 70–99)
Glucose-Capillary: 130 mg/dL — ABNORMAL HIGH (ref 70–99)
Glucose-Capillary: 143 mg/dL — ABNORMAL HIGH (ref 70–99)
Glucose-Capillary: 170 mg/dL — ABNORMAL HIGH (ref 70–99)
Glucose-Capillary: 96 mg/dL (ref 70–99)
Glucose-Capillary: 98 mg/dL (ref 70–99)

## 2024-04-25 NOTE — Progress Notes (Addendum)
 Nutrition Brief Note  Received consult from pharmacy to reevaluate pt's nutritional needs d/t weight gain. Pt's weight history has fluctuated but has remained around 90-100 kg since May 2025. There was a big jump in weight from 8/10-8/11 where pt gained 86 lbs, going from 200 lbs to 286 lbs. This is likely not weight gain rather suspect bed scale is not calibrated correctly. Recommend zeroing out bed scale and then reweighing pt. Continue with current nutritional interventions.   INTERVENTION:  Continue bolus tube feeding via PEG: 5 cartons (237ml) Jevity 1.5 daily 60ml water  flush before and after each bolus ( total) 60ml ProSource TF20 TID Provides 2015 kcal, 136g protein, total free water  (TF+FWF)   Recommend decreasing free water  flushes given improved sodium, discussed with NP who adjusted   Given nutrition stability, RD will check in monthly to ensure tube feeding regimen remains appropriate and there are no new tolerance issues. Please place RD consult if any nutrition related issues should arise.    NUTRITION DIAGNOSIS:  Inadequate oral intake related to inability to eat as evidenced by NPO status. - applicable; receiving enteral nutrition   GOAL:  Patient will meet greater than or equal to 90% of their needs - goal met via enteral nutrition   Olivia Kenning, RD Registered Dietitian  See Amion for more information

## 2024-04-25 NOTE — Plan of Care (Signed)
 Problem: Intracerebral Hemorrhage Tissue Perfusion: Goal: Complications of Intracerebral Hemorrhage will be minimized Outcome: Not Progressing   Problem: Nutrition: Goal: Risk of aspiration will decrease Outcome: Not Progressing Goal: Dietary intake will improve Outcome: Not Progressing   Problem: Fluid Volume: Goal: Ability to maintain a balanced intake and output will improve Outcome: Not Progressing   Problem: Skin Integrity: Goal: Risk for impaired skin integrity will decrease Outcome: Not Progressing   Problem: Tissue Perfusion: Goal: Adequacy of tissue perfusion will improve Outcome: Not Progressing   Problem: Clinical Measurements: Goal: Ability to maintain clinical measurements within normal limits will improve Outcome: Not Progressing Goal: Will remain free from infection Outcome: Not Progressing Goal: Diagnostic test results will improve Outcome: Not Progressing Goal: Respiratory complications will improve Outcome: Not Progressing Goal: Cardiovascular complication will be avoided Outcome: Not Progressing   Problem: Activity: Goal: Risk for activity intolerance will decrease Outcome: Not Progressing   Problem: Nutrition: Goal: Adequate nutrition will be maintained Outcome: Not Progressing   Problem: Elimination: Goal: Will not experience complications related to bowel motility Outcome: Not Progressing Goal: Will not experience complications related to urinary retention Outcome: Not Progressing   Problem: Pain Managment: Goal: General experience of comfort will improve and/or be controlled Outcome: Not Progressing   Problem: Safety: Goal: Ability to remain free from injury will improve Outcome: Not Progressing   Problem: Skin Integrity: Goal: Risk for impaired skin integrity will decrease Outcome: Not Progressing   Problem: Activity: Goal: Ability to tolerate increased activity will improve Outcome: Not Progressing   Problem:  Respiratory: Goal: Patent airway maintenance will improve Outcome: Not Progressing   Problem: Activity: Goal: Ability to tolerate increased activity will improve Outcome: Not Progressing   Problem: Respiratory: Goal: Ability to maintain a clear airway and adequate ventilation will improve Outcome: Not Progressing   Problem: Role Relationship: Goal: Method of communication will improve Outcome: Not Progressing   Problem: Education: Goal: Knowledge of disease or condition will improve Outcome: Not Progressing   Problem: Intracerebral Hemorrhage Tissue Perfusion: Goal: Complications of Intracerebral Hemorrhage will be minimized Outcome: Not Progressing   Problem: Self-Care: Goal: Ability to participate in self-care as condition permits will improve Outcome: Not Progressing Goal: Verbalization of feelings and concerns over difficulty with self-care will improve Outcome: Not Progressing Goal: Ability to communicate needs accurately will improve Outcome: Not Progressing   Problem: Nutrition: Goal: Risk of aspiration will decrease Outcome: Not Progressing Goal: Dietary intake will improve Outcome: Not Progressing   Problem: Education: Goal: Knowledge of disease or condition will improve Outcome: Not Progressing Goal: Knowledge of secondary prevention will improve (MUST DOCUMENT ALL) Outcome: Not Progressing Goal: Knowledge of patient specific risk factors will improve (DELETE if not current risk factor) Outcome: Not Progressing   Problem: Intracerebral Hemorrhage Tissue Perfusion: Goal: Complications of Intracerebral Hemorrhage will be minimized Outcome: Not Progressing   Problem: Coping: Goal: Will verbalize positive feelings about self Outcome: Not Progressing Goal: Will identify appropriate support needs Outcome: Not Progressing   Problem: Health Behavior/Discharge Planning: Goal: Ability to manage health-related needs will improve Outcome: Not Progressing Goal:  Goals will be collaboratively established with patient/family Outcome: Not Progressing   Problem: Self-Care: Goal: Ability to participate in self-care as condition permits will improve Outcome: Not Progressing Goal: Verbalization of feelings and concerns over difficulty with self-care will improve Outcome: Not Progressing Goal: Ability to communicate needs accurately will improve Outcome: Not Progressing   Problem: Nutrition: Goal: Risk of aspiration will decrease Outcome: Not Progressing Goal: Dietary intake will improve Outcome:  Not Progressing

## 2024-04-25 NOTE — Progress Notes (Signed)
 PROGRESS NOTE  Garrett [name] FMW:[id number] DOB: 11-17-1975 DOA: 10/27/2023 PCP: Pcp, No   LOS: 181 days   Brief narrative: 48 y/o male with past medical history of hypertension was found down at home agonal breathing.  History of uncontrolled hypertension but never had been to the doctor.  Patient was noted to have a large (4.1 cm) hemorrhage centered in the pons with intraventricular extension into the fourth ventricle and also extension into the basal cisterns and trace additional subarachnoid hemorrhage along the left parietal convexity. BP in ED was 262/156. CXR revealed RUL and perihilar infiltrates suggesting aspiration pneumonia. ED labs also revealed PH 7.169, LA 4.4, CPK 985. Patient was intubated in ED and was admitted hospital.  Patient has had prolonged hospitalization and sequence of events listed below.   Significant events: 3/08 - Intubated in ED, pontine bleed/SAH. CT head 17:09 Acute large hemorrhage 4.1 cm in pons with intraventricular extension without hydrocephalus 3/09 - CTA 03:14 AM No change in IPH in pons, similar biparietal [facility] 3/14 - Now awake, appears locked in can follow commands with eyes. 3/16 - Purposeful with left upper extremity  3/22 - Tracheostomy placed at bedside, bleeding issues overnight from trach site 3/24 - PEG (Dr. Sebastian) 3/24 - Sputum culture with MRSA 3/27 - Vomited. TF on hold. Abd film c/w ileus. Added reglan , got SSE. Did tolerate PSV all day  3/28 - BMs x2 after SSE day prior. Added back TFs at 1/2 rated. Tolerated PS 3/29 - Tolerated PS  3/31 - Tolerating CPAP PS 15/5, TF on hold due to ileus. 4/01 - Con't to hold tube feeds today, restarting vancomycin  and sending tracheal aspirate as peaks are 37, plat 24, driving 19. Thick secretions. Fever overnight.  4/02 - Peaks improved. Ongoing hiccups. Trickle feeding. Neuro exam unchanged.  4/20 - Trach was changed to cuffless #8 4/28 - Not safe to swallow. Limited communication and he  follows simple commands. On TF 5/01 - On TC 28%, with large secretions. Working with speech and he is improving to initiate PMV trials under ST supervision   5/05 - On TC 30%, 7 L/min. Trach #6 cuffless, changed 5/1 to facilitate PMV trials  5/12 - On TC 21%, 6 L/min. Trach #6 cuffless, unable to produce sounds or speak on PMV. No significant resp secretions. On PEG TF 6/10 - Tracheostomy was removed due to failure to well provide adequate airway 02/15/2024 pulmonary critical care consult. 7/07 Patient obtunded and desaturating on NRB; transfer to icu for intubation; updated mother over phone who wants to reverse code status to full code; ent consulted 7/09 Remains intubated on no continuous sedation, right eye open but does not track movement  7/16 Revision ENT trach with [facility] Flap 7/18 Weaned on vent 5 hours  7/19 Tolerated trach collar for the majority of the day but had some increased work of breath resulting in being placed back on vent support  7/20 No issues overnight, back on ATC this AM 7/21 Did not tolerate ATC. Placed back on PRVC. 7/24 ATC until afternoon and went on vent for WOB reportedly  7/25 ATC for a few hours  7/26 ATC x 6h 7/28 tolerating trach collar-tolerated for most of the day 7/30 on vent overnight and back on trach collar today 8/1 back on vent overnight  8/2 was able to maintain off of ventilator overnight but by 6 AM today patient was seen with signs of increased work of breathing including tachycardia therefore patient was placed back on ventilator 8/3  remained on ventilator overnight to transition to trach collar this a.m. 8/4 did 18 hour ATC yesterday; back on vent 3am today 8/5 TC for 12-14 hours. Then went back to vent because of tachypnea.  8/6 tolerated ATC at 30% for 24 hours. Back on vent this AM for rest as was a bit tachypneic 8/11 been off vent >24 hours. 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   8/12-off vent for over 48 hours 8/18 completed abx  8/19 Changed to cuffless trach 8/20 Increasing tachypnea, hypercarbia.  Trach changed back to #6 cuffed and brought back to ICU to be placed on ventilator.  Antibiotics broadened to linezolid  and cefepime  8/23 PSV wean 8/24 eeg for twitching no sz 8/26 started TC 8/27 did TC overnight, hypercarbic on repeat gas but compensated 04/18/2024: Last seen by ophthalmology. 04/19/2024: Transferred under [facility].  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 Not a candidate for decannulation given ineffective airway clearance - cont TC during day with full MV/ PRVC at night, now suspected to be night time vent dependent given recurrent compensated hypercarbia. 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  Reassessed by ophthalmology again on 04/18/2024, recommendations to continue erythromycin  eye drops q2 per Optho.  Recs to tape eyes with paper tape if remain open without blinking and to notify optho if worsening redness or opacification of left eye.     HTN Continue scheduled amlodipine , Lopressor  and as needed hydralazine .  Blood pressure seems to be  stable   Class III obesity. Nutrition Status:Body mass index is 43.58 kg/m.  Nutrition Problem: Inadequate oral intake Etiology: inability to eat Signs/Symptoms: NPO status Interventions: Tube feeding, Prostat, MVI, Juven    DVT prophylaxis: enoxaparin  (LOVENOX ) injection 40 mg Start: 03/06/24 1000 SCDs Start: 10/27/23 2226   Disposition: Uncertain at this time  Status is: Inpatient Remains inpatient appropriate because: Pending insurance authorization.    Code Status:     Code Status: Full Code  Family Communication: None at bedside  Consultants: PCCM Ophthalmology  Procedures: Intubation mechanical ventilation, tracheostomy placement PEG tube placement Foley catheter placement  Anti-infectives:  None currently  Anti-infectives (From admission, onward)    Start     Dose/Rate Route Frequency Ordered Stop   04/09/24 2200  linezolid  (ZYVOX ) tablet 600 mg  Status:  Discontinued        600 mg Per Tube Every 12 hours 04/09/24 1132 04/13/24 1332   04/09/24 1700  vancomycin  (VANCOREADY) IVPB 1500 mg/300 mL  Status:  Discontinued        1,500 mg 150 mL/hr over 120 Minutes Intravenous Every 12 hours 04/09/24 1108 04/09/24 1132   04/09/24 1000  linezolid  (ZYVOX ) IVPB 600 mg  Status:  Discontinued  600 mg 300 mL/hr over 60 Minutes Intravenous Every 12 hours 04/09/24 0527 04/09/24 1108   04/01/24 1145  ceFEPIme  (MAXIPIME ) 2 g in sodium chloride  0.9 % 100 mL IVPB        2 g 200 mL/hr over 30 Minutes Intravenous Every 8 hours 04/01/24 1137 04/14/24 2230   02/21/24 1000  vancomycin  (VANCOREADY) IVPB 1250 mg/250 mL        1,250 mg 166.7 mL/hr over 90 Minutes Intravenous Every 12 hours 02/20/24 2158 02/28/24 0130   02/16/24 1445  ceFEPIme  (MAXIPIME ) 2 g in sodium chloride  0.9 % 100 mL IVPB        2 g 200 mL/hr over 30 Minutes Intravenous Every 8 hours 02/16/24 1348 02/27/24 2322   02/16/24 0945  vancomycin  (VANCOREADY) IVPB 1500 mg/300 mL  Status:  Discontinued         1,500 mg 150 mL/hr over 120 Minutes Intravenous Every 12 hours 02/16/24 0848 02/20/24 2158   02/15/24 1215  Ampicillin -Sulbactam (UNASYN ) 3 g in sodium chloride  0.9 % 100 mL IVPB  Status:  Discontinued        3 g 200 mL/hr over 30 Minutes Intravenous Every 6 hours 02/15/24 1125 02/16/24 1226   01/24/24 2000  vancomycin  (VANCOREADY) IVPB 1250 mg/250 mL        1,250 mg 166.7 mL/hr over 90 Minutes Intravenous Every 24 hours 01/24/24 1154 01/27/24 0125   01/22/24 2200  Vancomycin  (VANCOCIN ) 1,250 mg in sodium chloride  0.9 % 250 mL IVPB  Status:  Discontinued        1,250 mg 166.7 mL/hr over 90 Minutes Intravenous Every 24 hours 01/22/24 1013 01/22/24 1020   01/22/24 2000  vancomycin  (VANCOREADY) IVPB 1250 mg/250 mL  Status:  Discontinued        1,250 mg 166.7 mL/hr over 90 Minutes Intravenous Every 24 hours 01/22/24 1020 01/24/24 1154   01/20/24 2000  ceFEPIme  (MAXIPIME ) 2 g in sodium chloride  0.9 % 100 mL IVPB  Status:  Discontinued        2 g 200 mL/hr over 30 Minutes Intravenous Every 8 hours 01/20/24 1900 01/24/24 1154   01/20/24 2000  Vancomycin  (VANCOCIN ) 1,250 mg in sodium chloride  0.9 % 250 mL IVPB  Status:  Discontinued        1,250 mg 166.7 mL/hr over 90 Minutes Intravenous Every 24 hours 01/20/24 1900 01/22/24 1013   01/03/24 1730  cefTRIAXone  (ROCEPHIN ) 1 g in sodium chloride  0.9 % 100 mL IVPB        1 g 200 mL/hr over 30 Minutes Intravenous Every 24 hours 01/03/24 1235 01/07/24 0710   01/02/24 1830  ceFEPIme  (MAXIPIME ) 2 g in sodium chloride  0.9 % 100 mL IVPB  Status:  Discontinued        2 g 200 mL/hr over 30 Minutes Intravenous Every 8 hours 01/02/24 1732 01/03/24 1235   01/02/24 1830  Vancomycin  (VANCOCIN ) 1,250 mg in sodium chloride  0.9 % 250 mL IVPB  Status:  Discontinued        1,250 mg 166.7 mL/hr over 90 Minutes Intravenous Every 24 hours 01/02/24 1732 01/04/24 1754   01/01/24 1200  cefTRIAXone  (ROCEPHIN ) 1 g in sodium chloride  0.9 % 100 mL IVPB  Status:  Discontinued         1 g 200 mL/hr over 30 Minutes Intravenous Every 24 hours 01/01/24 1057 01/02/24 1713   11/29/23 2200  linezolid  (ZYVOX ) tablet 600 mg        600 mg Per Tube Every 12 hours 11/29/23  1034 12/04/23 2215   11/28/23 1000  linezolid  (ZYVOX ) IVPB 600 mg  Status:  Discontinued        600 mg 300 mL/hr over 60 Minutes Intravenous Every 12 hours 11/28/23 0831 11/29/23 1034   11/23/23 1200  vancomycin  (VANCOREADY) IVPB 1250 mg/250 mL  Status:  Discontinued        1,250 mg 166.7 mL/hr over 90 Minutes Intravenous Every 24 hours 11/23/23 1036 11/28/23 0831   11/20/23 1100  vancomycin  (VANCOREADY) IVPB 1250 mg/250 mL  Status:  Discontinued        1,250 mg 166.7 mL/hr over 90 Minutes Intravenous Every 24 hours 11/20/23 0923 11/20/23 0923   11/20/23 1100  vancomycin  (VANCOREADY) IVPB 1250 mg/250 mL  Status:  Discontinued        1,250 mg 166.7 mL/hr over 90 Minutes Intravenous Every 24 hours 11/20/23 0923 11/23/23 1036   11/15/23 2300  vancomycin  (VANCOREADY) IVPB 1250 mg/250 mL        1,250 mg 166.7 mL/hr over 90 Minutes Intravenous Every 24 hours 11/15/23 2215 11/19/23 0924   11/12/23 2200  vancomycin  (VANCOREADY) IVPB 750 mg/150 mL  Status:  Discontinued        750 mg 150 mL/hr over 60 Minutes Intravenous Every 12 hours 11/12/23 0855 11/15/23 2215   11/12/23 0930  vancomycin  (VANCOREADY) IVPB 2000 mg/400 mL        2,000 mg 200 mL/hr over 120 Minutes Intravenous  Once 11/12/23 0838 11/12/23 1116   11/11/23 1345  Ampicillin -Sulbactam (UNASYN ) 3 g in sodium chloride  0.9 % 100 mL IVPB  Status:  Discontinued        3 g 200 mL/hr over 30 Minutes Intravenous Every 6 hours 11/11/23 1257 11/12/23 0836   10/28/23 0400  Ampicillin -Sulbactam (UNASYN ) 3 g in sodium chloride  0.9 % 100 mL IVPB  Status:  Discontinued        3 g 200 mL/hr over 30 Minutes Intravenous Every 6 hours 10/27/23 2151 11/02/23 0945   10/27/23 1845  vancomycin  (VANCOCIN ) IVPB 1000 mg/200 mL premix  Status:  Discontinued        1,000  mg 200 mL/hr over 60 Minutes Intravenous  Once 10/27/23 1843 10/27/23 1843   10/27/23 1845  ceFEPIme  (MAXIPIME ) 2 g in sodium chloride  0.9 % 100 mL IVPB        2 g 200 mL/hr over 30 Minutes Intravenous  Once 10/27/23 1843 10/27/23 2024   10/27/23 1845  vancomycin  (VANCOREADY) IVPB 2000 mg/400 mL        2,000 mg 200 mL/hr over 120 Minutes Intravenous  Once 10/27/23 1843 10/27/23 2123        Subjective: Today, patient was seen and examined at bedside.  Unable to express and locked-in state.   Objective: Vitals:   04/25/24 0500 04/25/24 0600  BP: 112/81 116/82  Pulse: 73 72  Resp: 12 12  Temp:    SpO2: 100% 100%    Intake/Output Summary (Last 24 hours) at 04/25/2024 0742 Last data filed at 04/25/2024 0600 Gross per 24 hour  Intake 747 ml  Output 1215 ml  Net -468 ml   Filed Weights   04/23/24 0725 04/24/24 0417 04/25/24 0500  Weight: 134 kg 130 kg 130 kg   Body mass index is 43.58 kg/m.   Physical Exam: GENERAL: Patient is alert awake, comfortable, opens eyes spontaneously, on vent.  Obese built NECK: is supple, no gross swelling noted.  Tracheostomy in place, conjunctival injection on the left eye with some mild proptosis. CHEST:  Clear to auscultation. No crackles or wheezes.  Diminished breath sounds bilaterally. CVS: S1 and S2 heard, no murmur. Regular rate and rhythm.  ABDOMEN: Soft, non-tender, bowel sounds are present.  PEG tube in place urethral catheter in place. EXTREMITIES: No edema. CNS: Does not respond to any commands. SKIN: warm and dry without rashes.  Data Review: I have personally reviewed the following laboratory data and studies,  CBC: Recent Labs  Lab 04/21/24 0746  WBC 6.9  HGB 10.6*  HCT 34.9*  MCV 89.3  PLT 409*   Basic Metabolic Panel: Recent Labs  Lab 04/18/24 0806 04/20/24 1047 04/22/24 0838 04/24/24 0912  NA 138 140 143 144  K 3.8 4.3 3.7 3.7  CL 96* 99 105 104  CO2 30 29 25  33*  GLUCOSE 180* 93 130* 117*  BUN 27* 33* 34*  27*  CREATININE 0.57* 0.53* 0.63 0.52*  CALCIUM 9.7 9.7 9.5 9.6   Liver Function Tests: No results for input(s): AST, ALT, ALKPHOS, BILITOT, PROT, ALBUMIN  in the last 168 hours. No results for input(s): LIPASE, AMYLASE in the last 168 hours. No results for input(s): AMMONIA in the last 168 hours. Cardiac Enzymes: No results for input(s): CKTOTAL, CKMB, CKMBINDEX, TROPONINI in the last 168 hours. BNP (last 3 results) Recent Labs    10/27/23 1820  BNP 210.4*    ProBNP (last 3 results) No results for input(s): PROBNP in the last 8760 hours.  CBG: Recent Labs  Lab 04/24/24 1518 04/24/24 1938 04/24/24 2324 04/25/24 0351 04/25/24 0739  GLUCAP 78 141* 115* 96 143*   No results found for this or any previous visit (from the past 240 hours).   Studies: No results found.    Jamarkis Branam, MD  [facility] 04/25/2024  If 7PM-7AM, please contact night-coverage

## 2024-04-26 DIAGNOSIS — Z930 Tracheostomy status: Secondary | ICD-10-CM | POA: Diagnosis not present

## 2024-04-26 DIAGNOSIS — I619 Nontraumatic intracerebral hemorrhage, unspecified: Secondary | ICD-10-CM | POA: Diagnosis not present

## 2024-04-26 LAB — GLUCOSE, CAPILLARY
Glucose-Capillary: 101 mg/dL — ABNORMAL HIGH (ref 70–99)
Glucose-Capillary: 114 mg/dL — ABNORMAL HIGH (ref 70–99)
Glucose-Capillary: 123 mg/dL — ABNORMAL HIGH (ref 70–99)
Glucose-Capillary: 103 mg/dL — ABNORMAL HIGH (ref 70–99)
Glucose-Capillary: 138 mg/dL — ABNORMAL HIGH (ref 70–99)
Glucose-Capillary: 131 mg/dL — ABNORMAL HIGH (ref 70–99)

## 2024-04-26 LAB — BASIC METABOLIC PANEL WITH GFR
Anion gap: 9 (ref 5–15)
BUN: 28 mg/dL — ABNORMAL HIGH (ref 6–20)
CO2: 29 mmol/L (ref 22–32)
Calcium: 9.6 mg/dL (ref 8.9–10.3)
Chloride: 103 mmol/L (ref 98–111)
Creatinine, Ser: 0.47 mg/dL — ABNORMAL LOW (ref 0.61–1.24)
GFR, Estimated: >60 mL/min (ref >60)
Glucose, Bld: 112 mg/dL — ABNORMAL HIGH (ref 70–99)
Potassium: 4.1 mmol/L (ref 3.5–5.1)
Sodium: 141 mmol/L (ref 135–145)

## 2024-04-26 NOTE — Progress Notes (Signed)
 PROGRESS NOTE  Garrett [name] FMW:[id number] DOB: September 12, 1975 DOA: 10/27/2023 PCP: Pcp, No   LOS: 182 days   Brief narrative: 48 y/o male with past medical history of hypertension was found down at home agonal breathing.  History of uncontrolled hypertension but never had been to the doctor.  Patient was noted to have a large (4.1 cm) hemorrhage centered in the pons with intraventricular extension into the fourth ventricle and also extension into the basal cisterns and trace additional subarachnoid hemorrhage along the left parietal convexity. BP in ED was 262/156. CXR revealed RUL and perihilar infiltrates suggesting aspiration pneumonia. ED labs also revealed PH 7.169, LA 4.4, CPK 985. Patient was intubated in ED and was admitted hospital.  Patient has had prolonged hospitalization and sequence of events listed below.   Significant events: 3/08 - Intubated in ED, pontine bleed/SAH. CT head 17:09 Acute large hemorrhage 4.1 cm in pons with intraventricular extension without hydrocephalus 3/09 - CTA 03:14 AM No change in IPH in pons, similar biparietal [facility] 3/14 - Now awake, appears locked in can follow commands with eyes. 3/16 - Purposeful with left upper extremity  3/22 - Tracheostomy placed at bedside, bleeding issues overnight from trach site 3/24 - PEG (Dr. Sebastian) 3/24 - Sputum culture with MRSA 3/27 - Vomited. TF on hold. Abd film c/w ileus. Added reglan , got SSE. Did tolerate PSV all day  3/28 - BMs x2 after SSE day prior. Added back TFs at 1/2 rated. Tolerated PS 3/29 - Tolerated PS  3/31 - Tolerating CPAP PS 15/5, TF on hold due to ileus. 4/01 - Con't to hold tube feeds today, restarting vancomycin  and sending tracheal aspirate as peaks are 37, plat 24, driving 19. Thick secretions. Fever overnight.  4/02 - Peaks improved. Ongoing hiccups. Trickle feeding. Neuro exam unchanged.  4/20 - Trach was changed to cuffless #8 4/28 - Not safe to swallow. Limited communication and he  follows simple commands. On TF 5/01 - On TC 28%, with large secretions. Working with speech and he is improving to initiate PMV trials under ST supervision   5/05 - On TC 30%, 7 L/min. Trach #6 cuffless, changed 5/1 to facilitate PMV trials  5/12 - On TC 21%, 6 L/min. Trach #6 cuffless, unable to produce sounds or speak on PMV. No significant resp secretions. On PEG TF 6/10 - Tracheostomy was removed due to failure to well provide adequate airway 02/15/2024 pulmonary critical care consult. 7/07 Patient obtunded and desaturating on NRB; transfer to icu for intubation; updated mother over phone who wants to reverse code status to full code; ent consulted 7/09 Remains intubated on no continuous sedation, right eye open but does not track movement  7/16 Revision ENT trach with [facility] Flap 7/18 Weaned on vent 5 hours  7/19 Tolerated trach collar for the majority of the day but had some increased work of breath resulting in being placed back on vent support  7/20 No issues overnight, back on ATC this AM 7/21 Did not tolerate ATC. Placed back on PRVC. 7/24 ATC until afternoon and went on vent for WOB reportedly  7/25 ATC for a few hours  7/26 ATC x 6h 7/28 tolerating trach collar-tolerated for most of the day 7/30 on vent overnight and back on trach collar today 8/1 back on vent overnight  8/2 was able to maintain off of ventilator overnight but by 6 AM today patient was seen with signs of increased work of breathing including tachycardia therefore patient was placed back on ventilator 8/3  remained on ventilator overnight to transition to trach collar this a.m. 8/4 did 18 hour ATC yesterday; back on vent 3am today 8/5 TC for 12-14 hours. Then went back to vent because of tachypnea.  8/6 tolerated ATC at 30% for 24 hours. Back on vent this AM for rest as was a bit tachypneic 8/11 been off vent >24 hours. 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   8/12-off vent for over 48 hours 8/18 completed abx  8/19 Changed to cuffless trach 8/20 Increasing tachypnea, hypercarbia.  Trach changed back to #6 cuffed and brought back to ICU to be placed on ventilator.  Antibiotics broadened to linezolid  and cefepime  8/23 PSV wean 8/24 eeg for twitching no sz 8/26 started TC 8/27 did TC overnight, hypercarbic on repeat gas but compensated 04/18/2024: Last seen by ophthalmology. 04/19/2024: Transferred under [facility].  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 Not a candidate for decannulation given ineffective airway clearance - cont TC during day with full MV/ PRVC at night, now suspected to be night time vent dependent given recurrent compensated hypercarbia. 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  Reassessed by ophthalmology again on 04/18/2024, recommendations to continue erythromycin  eye drops q2 per Optho.  Recs to tape eyes with paper tape if remain open without blinking and to notify optho if worsening redness or opacification of left eye.     HTN Continue scheduled amlodipine , Lopressor  and as needed hydralazine .  Blood pressure seems to be  stable   Class III obesity. Nutrition Status:Body mass index is 43.58 kg/m.  Nutrition Problem: Inadequate oral intake Etiology: inability to eat Signs/Symptoms: NPO status Interventions: Tube feeding, Prostat, MVI, Juven  Low-grade fever today.  Temperature max of 100.2 degrees.  Will continue to monitor closely.  If continued spike might need to panculture.    DVT prophylaxis: enoxaparin  (LOVENOX ) injection 40 mg Start: 03/06/24 1000 SCDs Start: 10/27/23 2226   Disposition: Uncertain at this time  Status is: Inpatient Remains inpatient appropriate because: Pending insurance authorization.    Code Status:     Code Status: Full Code  Family Communication: None at bedside  Consultants: PCCM Ophthalmology  Procedures: Intubation mechanical ventilation, tracheostomy placement PEG tube placement Foley catheter placement  Anti-infectives:  None currently  Anti-infectives (From admission, onward)    Start     Dose/Rate Route Frequency Ordered Stop   04/09/24 2200  linezolid  (ZYVOX ) tablet 600 mg  Status:  Discontinued        600 mg Per Tube Every 12 hours 04/09/24 1132 04/13/24 1332   04/09/24 1700  vancomycin  (VANCOREADY) IVPB 1500 mg/300 mL  Status:  Discontinued        1,500 mg 150 mL/hr over 120 Minutes  Intravenous Every 12 hours 04/09/24 1108 04/09/24 1132   04/09/24 1000  linezolid  (ZYVOX ) IVPB 600 mg  Status:  Discontinued        600 mg 300 mL/hr over 60 Minutes Intravenous Every 12 hours 04/09/24 0527 04/09/24 1108   04/01/24 1145  ceFEPIme  (MAXIPIME ) 2 g in sodium chloride  0.9 % 100 mL IVPB        2 g 200 mL/hr over 30 Minutes Intravenous Every 8 hours 04/01/24 1137 04/14/24 2230   02/21/24 1000  vancomycin  (VANCOREADY) IVPB 1250 mg/250 mL        1,250 mg 166.7 mL/hr over 90 Minutes Intravenous Every 12 hours 02/20/24 2158 02/28/24 0130   02/16/24 1445  ceFEPIme  (MAXIPIME ) 2 g in sodium chloride  0.9 % 100 mL IVPB        2 g 200 mL/hr over 30 Minutes  Intravenous Every 8 hours 02/16/24 1348 02/27/24 2322   02/16/24 0945  vancomycin  (VANCOREADY) IVPB 1500 mg/300 mL  Status:  Discontinued        1,500 mg 150 mL/hr over 120 Minutes Intravenous Every 12 hours 02/16/24 0848 02/20/24 2158   02/15/24 1215  Ampicillin -Sulbactam (UNASYN ) 3 g in sodium chloride  0.9 % 100 mL IVPB  Status:  Discontinued        3 g 200 mL/hr over 30 Minutes Intravenous Every 6 hours 02/15/24 1125 02/16/24 1226   01/24/24 2000  vancomycin  (VANCOREADY) IVPB 1250 mg/250 mL        1,250 mg 166.7 mL/hr over 90 Minutes Intravenous Every 24 hours 01/24/24 1154 01/27/24 0125   01/22/24 2200  Vancomycin  (VANCOCIN ) 1,250 mg in sodium chloride  0.9 % 250 mL IVPB  Status:  Discontinued        1,250 mg 166.7 mL/hr over 90 Minutes Intravenous Every 24 hours 01/22/24 1013 01/22/24 1020   01/22/24 2000  vancomycin  (VANCOREADY) IVPB 1250 mg/250 mL  Status:  Discontinued        1,250 mg 166.7 mL/hr over 90 Minutes Intravenous Every 24 hours 01/22/24 1020 01/24/24 1154   01/20/24 2000  ceFEPIme  (MAXIPIME ) 2 g in sodium chloride  0.9 % 100 mL IVPB  Status:  Discontinued        2 g 200 mL/hr over 30 Minutes Intravenous Every 8 hours 01/20/24 1900 01/24/24 1154   01/20/24 2000  Vancomycin  (VANCOCIN ) 1,250 mg in sodium chloride  0.9 % 250 mL IVPB  Status:  Discontinued        1,250 mg 166.7 mL/hr over 90 Minutes Intravenous Every 24 hours 01/20/24 1900 01/22/24 1013   01/03/24 1730  cefTRIAXone  (ROCEPHIN ) 1 g in sodium chloride  0.9 % 100 mL IVPB        1 g 200 mL/hr over 30 Minutes Intravenous Every 24 hours 01/03/24 1235 01/07/24 0710   01/02/24 1830  ceFEPIme  (MAXIPIME ) 2 g in sodium chloride  0.9 % 100 mL IVPB  Status:  Discontinued        2 g 200 mL/hr over 30 Minutes Intravenous Every 8 hours 01/02/24 1732 01/03/24 1235   01/02/24 1830  Vancomycin  (VANCOCIN ) 1,250 mg in sodium chloride  0.9 % 250 mL IVPB  Status:  Discontinued        1,250 mg 166.7 mL/hr over 90 Minutes Intravenous  Every 24 hours 01/02/24 1732 01/04/24 1754   01/01/24 1200  cefTRIAXone  (ROCEPHIN ) 1 g in sodium chloride  0.9 % 100 mL IVPB  Status:  Discontinued        1 g 200 mL/hr over 30 Minutes Intravenous Every 24 hours  01/01/24 1057 01/02/24 1713   11/29/23 2200  linezolid  (ZYVOX ) tablet 600 mg        600 mg Per Tube Every 12 hours 11/29/23 1034 12/04/23 2215   11/28/23 1000  linezolid  (ZYVOX ) IVPB 600 mg  Status:  Discontinued        600 mg 300 mL/hr over 60 Minutes Intravenous Every 12 hours 11/28/23 0831 11/29/23 1034   11/23/23 1200  vancomycin  (VANCOREADY) IVPB 1250 mg/250 mL  Status:  Discontinued        1,250 mg 166.7 mL/hr over 90 Minutes Intravenous Every 24 hours 11/23/23 1036 11/28/23 0831   11/20/23 1100  vancomycin  (VANCOREADY) IVPB 1250 mg/250 mL  Status:  Discontinued        1,250 mg 166.7 mL/hr over 90 Minutes Intravenous Every 24 hours 11/20/23 0923 11/20/23 0923   11/20/23 1100  vancomycin  (VANCOREADY) IVPB 1250 mg/250 mL  Status:  Discontinued        1,250 mg 166.7 mL/hr over 90 Minutes Intravenous Every 24 hours 11/20/23 0923 11/23/23 1036   11/15/23 2300  vancomycin  (VANCOREADY) IVPB 1250 mg/250 mL        1,250 mg 166.7 mL/hr over 90 Minutes Intravenous Every 24 hours 11/15/23 2215 11/19/23 0924   11/12/23 2200  vancomycin  (VANCOREADY) IVPB 750 mg/150 mL  Status:  Discontinued        750 mg 150 mL/hr over 60 Minutes Intravenous Every 12 hours 11/12/23 0855 11/15/23 2215   11/12/23 0930  vancomycin  (VANCOREADY) IVPB 2000 mg/400 mL        2,000 mg 200 mL/hr over 120 Minutes Intravenous  Once 11/12/23 0838 11/12/23 1116   11/11/23 1345  Ampicillin -Sulbactam (UNASYN ) 3 g in sodium chloride  0.9 % 100 mL IVPB  Status:  Discontinued        3 g 200 mL/hr over 30 Minutes Intravenous Every 6 hours 11/11/23 1257 11/12/23 0836   10/28/23 0400  Ampicillin -Sulbactam (UNASYN ) 3 g in sodium chloride  0.9 % 100 mL IVPB  Status:  Discontinued        3 g 200 mL/hr over 30 Minutes Intravenous  Every 6 hours 10/27/23 2151 11/02/23 0945   10/27/23 1845  vancomycin  (VANCOCIN ) IVPB 1000 mg/200 mL premix  Status:  Discontinued        1,000 mg 200 mL/hr over 60 Minutes Intravenous  Once 10/27/23 1843 10/27/23 1843   10/27/23 1845  ceFEPIme  (MAXIPIME ) 2 g in sodium chloride  0.9 % 100 mL IVPB        2 g 200 mL/hr over 30 Minutes Intravenous  Once 10/27/23 1843 10/27/23 2024   10/27/23 1845  vancomycin  (VANCOREADY) IVPB 2000 mg/400 mL        2,000 mg 200 mL/hr over 120 Minutes Intravenous  Once 10/27/23 1843 10/27/23 2123        Subjective: Today, patient was seen and examined at bedside.  Unable to express and locked-in state.   Objective: Vitals:   04/26/24 0800 04/26/24 0814  BP: 105/82   Pulse: 86 80  Resp: 12 12  Temp:  99.3 F (37.4 C)  SpO2: 98% 100%    Intake/Output Summary (Last 24 hours) at 04/26/2024 1129 Last data filed at 04/26/2024 1048 Gross per 24 hour  Intake 647 ml  Output 1295 ml  Net -648 ml   Filed Weights   04/24/24 0417 04/25/24 0500 04/26/24 0344  Weight: 130 kg 130 kg 130 kg   Body mass index is 43.58 kg/m.   Physical Exam: GENERAL: Patient is alert  awake, comfortable, opens eyes spontaneously, on vent.  Obese built, slightly diaphoretic. NECK: is supple, no gross swelling noted.  Tracheostomy in place, conjunctival injection on the left eye with some mild proptosis. CHEST:  Diminished breath sounds bilaterally. CVS: S1 and S2 heard, no murmur. Regular rate and rhythm.  ABDOMEN: Soft, PEG tube in place, urethral catheter in place. EXTREMITIES: No edema. CNS: Does not respond to any commands. SKIN: warm and dry without rashes.  Data Review: I have personally reviewed the following laboratory data and studies,  CBC: Recent Labs  Lab 04/21/24 0746  WBC 6.9  HGB 10.6*  HCT 34.9*  MCV 89.3  PLT 409*   Basic Metabolic Panel: Recent Labs  Lab 04/20/24 1047 04/22/24 0838 04/24/24 0912 04/26/24 0757  NA 140 143 144 141  K 4.3 3.7  3.7 4.1  CL 99 105 104 103  CO2 29 25 33* 29  GLUCOSE 93 130* 117* 112*  BUN 33* 34* 27* 28*  CREATININE 0.53* 0.63 0.52* 0.47*  CALCIUM 9.7 9.5 9.6 9.6   Liver Function Tests: No results for input(s): AST, ALT, ALKPHOS, BILITOT, PROT, ALBUMIN  in the last 168 hours. No results for input(s): LIPASE, AMYLASE in the last 168 hours. No results for input(s): AMMONIA in the last 168 hours. Cardiac Enzymes: No results for input(s): CKTOTAL, CKMB, CKMBINDEX, TROPONINI in the last 168 hours. BNP (last 3 results) Recent Labs    10/27/23 1820  BNP 210.4*    ProBNP (last 3 results) No results for input(s): PROBNP in the last 8760 hours.  CBG: Recent Labs  Lab 04/25/24 1528 04/25/24 1933 04/25/24 2355 04/26/24 0340 04/26/24 0734  GLUCAP 98 170* 110* 101* 114*   No results found for this or any previous visit (from the past 240 hours).   Studies: No results found.    Chozen Latulippe, MD  [facility] 04/26/2024  If 7PM-7AM, please contact night-coverage

## 2024-04-26 NOTE — Plan of Care (Signed)
 Problem: Intracerebral Hemorrhage Tissue Perfusion: Goal: Complications of Intracerebral Hemorrhage will be minimized Outcome: Not Progressing   Problem: Nutrition: Goal: Risk of aspiration will decrease Outcome: Not Progressing Goal: Dietary intake will improve Outcome: Not Progressing   Problem: Fluid Volume: Goal: Ability to maintain a balanced intake and output will improve Outcome: Not Progressing   Problem: Skin Integrity: Goal: Risk for impaired skin integrity will decrease Outcome: Not Progressing   Problem: Tissue Perfusion: Goal: Adequacy of tissue perfusion will improve Outcome: Not Progressing   Problem: Clinical Measurements: Goal: Ability to maintain clinical measurements within normal limits will improve Outcome: Not Progressing Goal: Will remain free from infection Outcome: Not Progressing Goal: Diagnostic test results will improve Outcome: Not Progressing Goal: Respiratory complications will improve Outcome: Not Progressing Goal: Cardiovascular complication will be avoided Outcome: Not Progressing   Problem: Activity: Goal: Risk for activity intolerance will decrease Outcome: Not Progressing   Problem: Nutrition: Goal: Adequate nutrition will be maintained Outcome: Not Progressing   Problem: Elimination: Goal: Will not experience complications related to bowel motility Outcome: Not Progressing Goal: Will not experience complications related to urinary retention Outcome: Not Progressing   Problem: Pain Managment: Goal: General experience of comfort will improve and/or be controlled Outcome: Not Progressing   Problem: Safety: Goal: Ability to remain free from injury will improve Outcome: Not Progressing   Problem: Skin Integrity: Goal: Risk for impaired skin integrity will decrease Outcome: Not Progressing   Problem: Activity: Goal: Ability to tolerate increased activity will improve Outcome: Not Progressing   Problem:  Respiratory: Goal: Patent airway maintenance will improve Outcome: Not Progressing   Problem: Activity: Goal: Ability to tolerate increased activity will improve Outcome: Not Progressing   Problem: Respiratory: Goal: Ability to maintain a clear airway and adequate ventilation will improve Outcome: Not Progressing   Problem: Role Relationship: Goal: Method of communication will improve Outcome: Not Progressing   Problem: Education: Goal: Knowledge of disease or condition will improve Outcome: Not Progressing   Problem: Intracerebral Hemorrhage Tissue Perfusion: Goal: Complications of Intracerebral Hemorrhage will be minimized Outcome: Not Progressing   Problem: Self-Care: Goal: Ability to participate in self-care as condition permits will improve Outcome: Not Progressing Goal: Verbalization of feelings and concerns over difficulty with self-care will improve Outcome: Not Progressing Goal: Ability to communicate needs accurately will improve Outcome: Not Progressing   Problem: Nutrition: Goal: Risk of aspiration will decrease Outcome: Not Progressing Goal: Dietary intake will improve Outcome: Not Progressing   Problem: Education: Goal: Knowledge of disease or condition will improve Outcome: Not Progressing Goal: Knowledge of secondary prevention will improve (MUST DOCUMENT ALL) Outcome: Not Progressing Goal: Knowledge of patient specific risk factors will improve (DELETE if not current risk factor) Outcome: Not Progressing   Problem: Intracerebral Hemorrhage Tissue Perfusion: Goal: Complications of Intracerebral Hemorrhage will be minimized Outcome: Not Progressing   Problem: Coping: Goal: Will verbalize positive feelings about self Outcome: Not Progressing Goal: Will identify appropriate support needs Outcome: Not Progressing   Problem: Health Behavior/Discharge Planning: Goal: Ability to manage health-related needs will improve Outcome: Not Progressing Goal:  Goals will be collaboratively established with patient/family Outcome: Not Progressing   Problem: Self-Care: Goal: Ability to participate in self-care as condition permits will improve Outcome: Not Progressing Goal: Verbalization of feelings and concerns over difficulty with self-care will improve Outcome: Not Progressing Goal: Ability to communicate needs accurately will improve Outcome: Not Progressing   Problem: Nutrition: Goal: Risk of aspiration will decrease Outcome: Not Progressing Goal: Dietary intake will improve Outcome:  Not Progressing

## 2024-04-27 DIAGNOSIS — Z930 Tracheostomy status: Secondary | ICD-10-CM | POA: Diagnosis not present

## 2024-04-27 DIAGNOSIS — I619 Nontraumatic intracerebral hemorrhage, unspecified: Secondary | ICD-10-CM | POA: Diagnosis not present

## 2024-04-27 LAB — GLUCOSE, CAPILLARY
Glucose-Capillary: 88 mg/dL (ref 70–99)
Glucose-Capillary: 146 mg/dL — ABNORMAL HIGH (ref 70–99)
Glucose-Capillary: 156 mg/dL — ABNORMAL HIGH (ref 70–99)
Glucose-Capillary: 125 mg/dL — ABNORMAL HIGH (ref 70–99)
Glucose-Capillary: 115 mg/dL — ABNORMAL HIGH (ref 70–99)

## 2024-04-27 NOTE — Plan of Care (Signed)
  Problem: Nutrition: Goal: Dietary intake will improve 04/27/2024 1632 by Nowell Sid LABOR, RN Outcome: Progressing 04/27/2024 1629 by Nowell Sid LABOR, RN Outcome: Progressing   Problem: Fluid Volume: Goal: Ability to maintain a balanced intake and output will improve 04/27/2024 1632 by Nowell Sid LABOR, RN Outcome: Progressing 04/27/2024 1629 by Nowell Sid LABOR, RN Outcome: Progressing

## 2024-04-27 NOTE — Progress Notes (Signed)
 PROGRESS NOTE  Garrett [name] FMW:[id number] DOB: [birth year] DOA: 10/27/2023 PCP: Pcp, No   LOS: 183 days   Brief narrative: 48 y/o male with past medical history of hypertension was found down at home agonal breathing.  History of uncontrolled hypertension but never had been to the doctor.  Patient was noted to have a large (4.1 cm) hemorrhage centered in the pons with intraventricular extension into the fourth ventricle and also extension into the basal cisterns and trace additional subarachnoid hemorrhage along the left parietal convexity. BP in ED was 262/156. CXR revealed RUL and perihilar infiltrates suggesting aspiration pneumonia. ED labs also revealed PH 7.169, LA 4.4, CPK 985. Patient was intubated in ED and was admitted hospital.  Patient has had prolonged hospitalization and sequence of events listed below.   Significant events: 3/08 - Intubated in ED, pontine bleed/SAH. CT head 17:09 Acute large hemorrhage 4.1 cm in pons with intraventricular extension without hydrocephalus 3/09 - CTA 03:14 AM No change in IPH in pons, similar [facility]arietal [facility] 3/14 - Now awake, appears locked in can follow commands with eyes. 3/16 - Purposeful with left upper extremity  3/22 - Tracheostomy placed at bedside, bleeding issues overnight from trach site 3/24 - PEG (Dr. Sebastian) 3/24 - Sputum culture with MRSA 3/27 - Vomited. TF on hold. Abd film c/w ileus. Added reglan , got SSE. Did tolerate PSV all day  3/28 - BMs x2 after SSE day prior. Added back TFs at 1/2 rated. Tolerated PS 3/29 - Tolerated PS  3/31 - Tolerating CPAP PS 15/5, TF on hold due to ileus. 4/01 - Con't to hold tube feeds today, restarting vancomycin  and sending tracheal aspirate as peaks are 37, plat 24, driving 19. Thick secretions. Fever overnight.  4/02 - Peaks improved. Ongoing hiccups. Trickle feeding. Neuro exam unchanged.  4/20 - Trach was changed to cuffless #8 4/28 - Not safe to swallow. Limited communication and he  follows simple commands. On TF 5/01 - On TC 28%, with large secretions. Working with speech and he is improving to initiate PMV trials under ST supervision   5/05 - On TC 30%, 7 L/min. Trach #6 cuffless, changed 5/1 to facilitate PMV trials  5/12 - On TC 21%, 6 L/min. Trach #6 cuffless, unable to produce sounds or speak on PMV. No significant resp secretions. On PEG TF 6/10 - Tracheostomy was removed due to failure to well provide adequate airway 02/15/2024 pulmonary critical care consult. 7/07 Patient obtunded and desaturating on NRB; transfer to icu for intubation; updated mother over phone who wants to reverse code status to full code; ent consulted 7/09 Remains intubated on no continuous sedation, right eye open but does not track movement  7/16 Revision ENT trach with [facility] Flap 7/18 Weaned on vent 5 hours  7/19 Tolerated trach collar for the majority of the day but had some increased work of breath resulting in being placed back on vent support  7/20 No issues overnight, back on ATC this AM 7/21 Did not tolerate ATC. Placed back on PRVC. 7/24 ATC until afternoon and went on vent for WOB reportedly  7/25 ATC for a few hours  7/26 ATC x 6h 7/28 tolerating trach collar-tolerated for most of the day 7/30 on vent overnight and back on trach collar today 8/1 back on vent overnight  8/2 was able to maintain off of ventilator overnight but by 6 AM today patient was seen with signs of increased work of breathing including tachycardia therefore patient was placed back on ventilator 8/3  remained on ventilator overnight to transition to trach collar this a.m. 8/4 did 18 hour ATC yesterday; back on vent 3am today 8/5 TC for 12-14 hours. Then went back to vent because of tachypnea.  8/6 tolerated ATC at 30% for 24 hours. Back on vent this AM for rest as was a bit tachypneic 8/11 been off vent >24 hours. 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   8/12-off vent for over 48 hours 8/18 completed abx  8/19 Changed to cuffless trach 8/20 Increasing tachypnea, hypercarbia.  Trach changed back to #6 cuffed and brought back to ICU to be placed on ventilator.  Antibiotics broadened to linezolid  and cefepime  8/23 PSV wean 8/24 eeg for twitching no sz 8/26 started TC 8/27 did TC overnight, hypercarbic on repeat gas but compensated 04/18/2024: Last seen by ophthalmology. 04/19/2024: Transferred under [facility].  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  No new issues.   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 Not a candidate for decannulation given ineffective airway clearance, continue TC during day with full MV/ PRVC at night, now suspected to be night time vent dependent given recurrent compensated hypercarbia. Continue trach care per protocol.   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  Reassessed by ophthalmology again on 04/18/2024, recommendations to continue erythromycin  eye drops q2 per Optho.  Recommendation is to tape eyes with paper tape if remain open without blinking and to notify optho if worsening redness or opacification of left eye.     HTN Continue scheduled amlodipine , Lopressor  and as needed  hydralazine .  Blood pressure seems to be stable   Class III obesity. Nutrition Status:Body mass index is 43.58 kg/m.  Nutrition Problem: Inadequate oral intake Etiology: inability to eat Signs/Symptoms: NPO status Interventions: Tube feeding, Prostat, MVI, Juven    DVT prophylaxis: enoxaparin  (LOVENOX ) injection 40 mg Start: 03/06/24 1000 SCDs Start: 10/27/23 2226   Disposition: Uncertain at this time  Status is: Inpatient Remains inpatient appropriate because: Pending placement    Code Status:     Code Status: Full Code  Family Communication: None at bedside  Consultants: PCCM Ophthalmology  Procedures: Intubation, mechanical ventilation, tracheostomy placement PEG tube placement Foley catheter placement  Anti-infectives:  None currently  Subjective: Today, patient was seen and examined at bedside.  No interval complaints reported by the nursing staff.  Objective: Vitals:   04/27/24 0700 04/27/24 0709  BP: 103/76   Pulse: 80   Resp: 12   Temp:  98.3 F (36.8 C)  SpO2: 100%     Intake/Output Summary (Last 24 hours) at 04/27/2024 0916 Last data filed at 04/27/2024 0915 Gross per 24 hour  Intake 1251 ml  Output 1005 ml  Net 246 ml   Filed Weights   04/25/24 0500 04/26/24 0344 04/27/24 0413  Weight: 130 kg  130 kg 130 kg   Body mass index is 43.58 kg/m.   Physical Exam:  General: Obese built, not in obvious distress, on trach collar, opens eyes spontaneously HENT:   No scleral pallor or icterus noted. Oral mucosa is moist.  Tracheostomy in place, left eye redness. Chest:    Diminished breath sounds bilaterally.  CVS: S1 &S2 heard. No murmur.  Regular rate and rhythm. Abdomen: Soft, nontender obese, PEG tube in place, urethral catheter in place bowel sounds are heard.   Extremities: No cyanosis, clubbing or edema.  Peripheral pulses are palpable. Psych: Spontaneous eye opening, locked-in state, persistent vegetative state CNS: Locked-in state. Skin:  Warm and dry.     Data Review: I have personally reviewed the following laboratory data and studies,  CBC: Recent Labs  Lab 04/21/24 0746  WBC 6.9  HGB 10.6*  HCT 34.9*  MCV 89.3  PLT 409*   Basic Metabolic Panel: Recent Labs  Lab 04/20/24 1047 04/22/24 0838 04/24/24 0912 04/26/24 0757  NA 140 143 144 141  K 4.3 3.7 3.7 4.1  CL 99 105 104 103  CO2 29 25 33* 29  GLUCOSE 93 130* 117* 112*  BUN 33* 34* 27* 28*  CREATININE 0.53* 0.63 0.52* 0.47*  CALCIUM 9.7 9.5 9.6 9.6   Liver Function Tests: No results for input(s): AST, ALT, ALKPHOS, BILITOT, PROT, ALBUMIN  in the last 168 hours. No results for input(s): LIPASE, AMYLASE in the last 168 hours. No results for input(s): AMMONIA in the last 168 hours. Cardiac Enzymes: No results for input(s): CKTOTAL, CKMB, CKMBINDEX, TROPONINI in the last 168 hours. BNP (last 3 results) Recent Labs    10/27/23 1820  BNP 210.4*    ProBNP (last 3 results) No results for input(s): PROBNP in the last 8760 hours.  CBG: Recent Labs  Lab 04/26/24 1512 04/26/24 1917 04/26/24 2316 04/27/24 0332 04/27/24 0707  GLUCAP 103* 138* 131* 88 146*   No results found for this or any previous visit (from the past 240 hours).   Studies: No results found.    Kinjal Neitzke, MD  [facility] 04/27/2024  If 7PM-7AM, please contact night-coverage

## 2024-04-27 NOTE — Plan of Care (Signed)
  Problem: Nutrition: Goal: Risk of aspiration will decrease Outcome: Progressing Goal: Dietary intake will improve Outcome: Progressing   Problem: Fluid Volume: Goal: Ability to maintain a balanced intake and output will improve Outcome: Progressing   Problem: Skin Integrity: Goal: Risk for impaired skin integrity will decrease Outcome: Progressing

## 2024-04-28 DIAGNOSIS — J9622 Acute and chronic respiratory failure with hypercapnia: Secondary | ICD-10-CM | POA: Diagnosis not present

## 2024-04-28 DIAGNOSIS — J9621 Acute and chronic respiratory failure with hypoxia: Secondary | ICD-10-CM | POA: Diagnosis not present

## 2024-04-28 DIAGNOSIS — Z930 Tracheostomy status: Secondary | ICD-10-CM | POA: Diagnosis not present

## 2024-04-28 DIAGNOSIS — I619 Nontraumatic intracerebral hemorrhage, unspecified: Secondary | ICD-10-CM | POA: Diagnosis not present

## 2024-04-28 LAB — GLUCOSE, CAPILLARY
Glucose-Capillary: 115 mg/dL — ABNORMAL HIGH (ref 70–99)
Glucose-Capillary: 116 mg/dL — ABNORMAL HIGH (ref 70–99)
Glucose-Capillary: 124 mg/dL — ABNORMAL HIGH (ref 70–99)
Glucose-Capillary: 135 mg/dL — ABNORMAL HIGH (ref 70–99)
Glucose-Capillary: 148 mg/dL — ABNORMAL HIGH (ref 70–99)
Glucose-Capillary: 88 mg/dL (ref 70–99)
Glucose-Capillary: 96 mg/dL (ref 70–99)

## 2024-04-28 LAB — CBC
HCT: 39.6 % (ref 39.0–52.0)
Hemoglobin: 11.5 g/dL — ABNORMAL LOW (ref 13.0–17.0)
MCH: 26.7 pg (ref 26.0–34.0)
MCHC: 29 g/dL — ABNORMAL LOW (ref 30.0–36.0)
MCV: 91.9 fL (ref 80.0–100.0)
Platelets: 334 K/uL (ref 150–400)
RBC: 4.31 MIL/uL (ref 4.22–5.81)
RDW: 15.9 % — ABNORMAL HIGH (ref 11.5–15.5)
WBC: 7.4 K/uL (ref 4.0–10.5)
nRBC: 0 % (ref 0.0–0.2)

## 2024-04-28 NOTE — Plan of Care (Signed)
  Problem: Nutrition: Goal: Dietary intake will improve Outcome: Progressing   Problem: Nutrition: Goal: Risk of aspiration will decrease Outcome: Progressing   Problem: Intracerebral Hemorrhage Tissue Perfusion: Goal: Complications of Intracerebral Hemorrhage will be minimized Outcome: Progressing

## 2024-04-28 NOTE — Plan of Care (Signed)
  Problem: Intracerebral Hemorrhage Tissue Perfusion: Goal: Complications of Intracerebral Hemorrhage will be minimized Outcome: Progressing   Problem: Nutrition: Goal: Risk of aspiration will decrease Outcome: Progressing Goal: Dietary intake will improve Outcome: Progressing   Problem: Fluid Volume: Goal: Ability to maintain a balanced intake and output will improve Outcome: Progressing   Problem: Skin Integrity: Goal: Risk for impaired skin integrity will decrease Outcome: Progressing   Problem: Tissue Perfusion: Goal: Adequacy of tissue perfusion will improve Outcome: Progressing   Problem: Clinical Measurements: Goal: Ability to maintain clinical measurements within normal limits will improve Outcome: Progressing Goal: Will remain free from infection Outcome: Progressing Goal: Diagnostic test results will improve Outcome: Progressing Goal: Respiratory complications will improve Outcome: Progressing Goal: Cardiovascular complication will be avoided Outcome: Progressing   Problem: Activity: Goal: Risk for activity intolerance will decrease Outcome: Progressing   Problem: Nutrition: Goal: Adequate nutrition will be maintained Outcome: Progressing   Problem: Elimination: Goal: Will not experience complications related to bowel motility Outcome: Progressing Goal: Will not experience complications related to urinary retention Outcome: Progressing   Problem: Pain Managment: Goal: General experience of comfort will improve and/or be controlled Outcome: Progressing   Problem: Safety: Goal: Ability to remain free from injury will improve Outcome: Progressing   Problem: Skin Integrity: Goal: Risk for impaired skin integrity will decrease Outcome: Progressing   Problem: Activity: Goal: Ability to tolerate increased activity will improve Outcome: Progressing   Problem: Respiratory: Goal: Patent airway maintenance will improve Outcome: Progressing   Problem:  Activity: Goal: Ability to tolerate increased activity will improve Outcome: Progressing   Problem: Respiratory: Goal: Ability to maintain a clear airway and adequate ventilation will improve Outcome: Progressing   Problem: Role Relationship: Goal: Method of communication will improve Outcome: Progressing   Problem: Education: Goal: Knowledge of disease or condition will improve Outcome: Progressing   Problem: Intracerebral Hemorrhage Tissue Perfusion: Goal: Complications of Intracerebral Hemorrhage will be minimized Outcome: Progressing   Problem: Self-Care: Goal: Ability to participate in self-care as condition permits will improve Outcome: Progressing Goal: Verbalization of feelings and concerns over difficulty with self-care will improve Outcome: Progressing Goal: Ability to communicate needs accurately will improve Outcome: Progressing   Problem: Nutrition: Goal: Risk of aspiration will decrease Outcome: Progressing Goal: Dietary intake will improve Outcome: Progressing   Problem: Education: Goal: Knowledge of disease or condition will improve Outcome: Progressing Goal: Knowledge of secondary prevention will improve (MUST DOCUMENT ALL) Outcome: Progressing Goal: Knowledge of patient specific risk factors will improve (DELETE if not current risk factor) Outcome: Progressing   Problem: Intracerebral Hemorrhage Tissue Perfusion: Goal: Complications of Intracerebral Hemorrhage will be minimized Outcome: Progressing   Problem: Coping: Goal: Will verbalize positive feelings about self Outcome: Progressing Goal: Will identify appropriate support needs Outcome: Progressing   Problem: Health Behavior/Discharge Planning: Goal: Ability to manage health-related needs will improve Outcome: Progressing Goal: Goals will be collaboratively established with patient/family Outcome: Progressing   Problem: Self-Care: Goal: Ability to participate in self-care as condition  permits will improve Outcome: Progressing Goal: Verbalization of feelings and concerns over difficulty with self-care will improve Outcome: Progressing Goal: Ability to communicate needs accurately will improve Outcome: Progressing   Problem: Nutrition: Goal: Risk of aspiration will decrease Outcome: Progressing Goal: Dietary intake will improve Outcome: Progressing

## 2024-04-28 NOTE — Progress Notes (Signed)
 PROGRESS NOTE  Garrett [name] FMW:[id number] DOB: 1975-12-24 DOA: 10/27/2023 PCP: Pcp, No   LOS: 184 days   Brief narrative: 48 y/o male with past medical history of hypertension was found down at home agonal breathing.  History of uncontrolled hypertension but never had been to the doctor.  Patient was noted to have a large (4.1 cm) hemorrhage centered in the pons with intraventricular extension into the fourth ventricle and also extension into the basal cisterns and trace additional subarachnoid hemorrhage along the left parietal convexity. BP in ED was 262/156. CXR revealed RUL and perihilar infiltrates suggesting aspiration pneumonia. ED labs also revealed PH 7.169, LA 4.4, CPK 985. Patient was intubated in ED and was admitted hospital.  Patient has had prolonged hospitalization and sequence of events listed below.   Significant events: 3/08 - Intubated in ED, pontine bleed/SAH. CT head 17:09 Acute large hemorrhage 4.1 cm in pons with intraventricular extension without hydrocephalus 3/09 - CTA 03:14 AM No change in IPH in pons, similar biparietal [facility] 3/14 - Now awake, appears locked in can follow commands with eyes. 3/16 - Purposeful with left upper extremity  3/22 - Tracheostomy placed at bedside, bleeding issues overnight from trach site 3/24 - PEG (Dr. Sebastian) 3/24 - Sputum culture with MRSA 3/27 - Vomited. TF on hold. Abd film c/w ileus. Added reglan , got SSE. Did tolerate PSV all day  3/28 - BMs x2 after SSE day prior. Added back TFs at 1/2 rated. Tolerated PS 3/29 - Tolerated PS  3/31 - Tolerating CPAP PS 15/5, TF on hold due to ileus. 4/01 - Con't to hold tube feeds today, restarting vancomycin  and sending tracheal aspirate as peaks are 37, plat 24, driving 19. Thick secretions. Fever overnight.  4/02 - Peaks improved. Ongoing hiccups. Trickle feeding. Neuro exam unchanged.  4/20 - Trach was changed to cuffless #8 4/28 - Not safe to swallow. Limited communication and he  follows simple commands. On TF 5/01 - On TC 28%, with large secretions. Working with speech and he is improving to initiate PMV trials under ST supervision   5/05 - On TC 30%, 7 L/min. Trach #6 cuffless, changed 5/1 to facilitate PMV trials  5/12 - On TC 21%, 6 L/min. Trach #6 cuffless, unable to produce sounds or speak on PMV. No significant resp secretions. On PEG TF 6/10 - Tracheostomy was removed due to failure to well provide adequate airway 02/15/2024 pulmonary critical care consult. 7/07 Patient obtunded and desaturating on NRB; transfer to icu for intubation; updated mother over phone who wants to reverse code status to full code; ent consulted 7/09 Remains intubated on no continuous sedation, right eye open but does not track movement  7/16 Revision ENT trach with [facility] Flap 7/18 Weaned on vent 5 hours  7/19 Tolerated trach collar for the majority of the day but had some increased work of breath resulting in being placed back on vent support  7/20 No issues overnight, back on ATC this AM 7/21 Did not tolerate ATC. Placed back on PRVC. 7/24 ATC until afternoon and went on vent for WOB reportedly  7/25 ATC for a few hours  7/26 ATC x 6h 7/28 tolerating trach collar-tolerated for most of the day 7/30 on vent overnight and back on trach collar today 8/1 back on vent overnight  8/2 was able to maintain off of ventilator overnight but by 6 AM today patient was seen with signs of increased work of breathing including tachycardia therefore patient was placed back on ventilator 8/3  remained on ventilator overnight to transition to trach collar this a.m. 8/4 did 18 hour ATC yesterday; back on vent 3am today 8/5 TC for 12-14 hours. Then went back to vent because of tachypnea.  8/6 tolerated ATC at 30% for 24 hours. Back on vent this AM for rest as was a bit tachypneic 8/11 been off vent >24 hours. 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   8/12-off vent for over 48 hours 8/18 completed abx  8/19 Changed to cuffless trach 8/20 Increasing tachypnea, hypercarbia.  Trach changed back to #6 cuffed and brought back to ICU to be placed on ventilator.  Antibiotics broadened to linezolid  and cefepime  8/23 PSV wean 8/24 eeg for twitching no sz 8/26 started TC 8/27 did TC overnight, hypercarbic on repeat gas but compensated 04/18/2024: Last seen by ophthalmology. 04/19/2024: Transferred under [facility].  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  No new issues.   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 Not a candidate for decannulation given ineffective airway clearance, continue TC during day with full MV/ PRVC at night, now suspected to be night time vent dependent given recurrent compensated hypercarbia. Continue trach care per protocol.   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  Reassessed by ophthalmology again on 04/18/2024, recommendations to continue erythromycin  eye drops q2 per Optho.  Recommendation is to tape eyes with paper tape if remain open without blinking and to notify optho if worsening redness or opacification of left eye.     HTN Continue scheduled amlodipine , Lopressor  and as needed  hydralazine .  Blood pressure seems to be stable   Class III obesity. Nutrition Status:Body mass index is 43.58 kg/m.  Nutrition Problem: Inadequate oral intake Etiology: inability to eat Signs/Symptoms: NPO status Interventions: Tube feeding, Prostat, MVI, Juven    DVT prophylaxis: enoxaparin  (LOVENOX ) injection 40 mg Start: 03/06/24 1000 SCDs Start: 10/27/23 2226   Disposition: Uncertain at this time  Status is: Inpatient Remains inpatient appropriate because: Pending placement    Code Status:     Code Status: Full Code  Family Communication: None at bedside  Consultants: PCCM Ophthalmology  Procedures: Intubation, mechanical ventilation, tracheostomy placement PEG tube placement Foley catheter placement  Anti-infectives:  None currently  Subjective: Today, patient was seen and examined at bedside.  No interval complaints reported by the nursing staff.  Objective: Vitals:   04/28/24 0800 04/28/24 1109  BP: 103/79   Pulse: 77   Resp: (!) 0   Temp:  98.6 F (37 C)  SpO2: 100%     Intake/Output Summary (Last 24 hours) at 04/28/2024 1113 Last data filed at 04/28/2024 0939 Gross per 24 hour  Intake 1368 ml  Output 1210 ml  Net 158 ml   Filed Weights   04/26/24 0344 04/27/24 0413 04/28/24 0356  Weight: 130  kg 130 kg 130 kg   Body mass index is 43.58 kg/m.   Physical Exam:  General: Obese built, not in obvious distress, on trach collar, opens eyes spontaneously HENT:   No scleral pallor or icterus noted. Oral mucosa is moist.  Tracheostomy in place, left eye redness. Chest:    Diminished breath sounds bilaterally.  CVS: S1 &S2 heard. No murmur.  Regular rate and rhythm. Abdomen: Soft, nontender obese, PEG tube in place, urethral catheter in place bowel sounds are heard.   Extremities: No cyanosis, clubbing or edema.  Peripheral pulses are palpable. Psych: Spontaneous eye opening, locked-in state, persistent vegetative state CNS: Locked-in  state. Skin: Warm and dry.     Data Review: I have personally reviewed the following laboratory data and studies,  CBC: Recent Labs  Lab 04/28/24 0920  WBC 7.4  HGB 11.5*  HCT 39.6  MCV 91.9  PLT 334   Basic Metabolic Panel: Recent Labs  Lab 04/22/24 0838 04/24/24 0912 04/26/24 0757  NA 143 144 141  K 3.7 3.7 4.1  CL 105 104 103  CO2 25 33* 29  GLUCOSE 130* 117* 112*  BUN 34* 27* 28*  CREATININE 0.63 0.52* 0.47*  CALCIUM 9.5 9.6 9.6   Liver Function Tests: No results for input(s): AST, ALT, ALKPHOS, BILITOT, PROT, ALBUMIN  in the last 168 hours. No results for input(s): LIPASE, AMYLASE in the last 168 hours. No results for input(s): AMMONIA in the last 168 hours. Cardiac Enzymes: No results for input(s): CKTOTAL, CKMB, CKMBINDEX, TROPONINI in the last 168 hours. BNP (last 3 results) Recent Labs    10/27/23 1820  BNP 210.4*    ProBNP (last 3 results) No results for input(s): PROBNP in the last 8760 hours.  CBG: Recent Labs  Lab 04/27/24 1946 04/28/24 0010 04/28/24 0324 04/28/24 0728 04/28/24 1108  GLUCAP 115* 124* 96 135* 148*   No results found for this or any previous visit (from the past 240 hours).   Studies: No results found.    Jule Schlabach, MD  [facility] 04/28/2024  If 7PM-7AM, please contact night-coverage

## 2024-04-28 NOTE — Progress Notes (Signed)
 NAME:  Garrett [name], MRN:  [id number], DOB:  1975-10-04, LOS: 184 ADMISSION DATE:  10/27/2023, CONSULTATION DATE:  10/27/2023 REFERRING MD: Regalado - [facility], CHIEF COMPLAINT:  Found down   History of Present Illness:  48 y/o man who was found down at home. PMHx significant for HTN.  Patient was down for an unknown amount of time, found by his fiance when she came home from work, reportedly with agonal breathing. Patient had driven her to work the morning of admission.  He has HTN but never checks his BP and does not follow with MD. Patient's fiance states that she can tell when his BP is really high; his eyes get blood shot and he is sweating. No h/o seizures. Besides almost daily marijuana and cigarette smoking, she is not aware of any other drug use. Drug screen positive for opioids and he was given several doses of Narcan . 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 BP in ED 262/156. CXR revealed RUL and perihilar infiltrates suggesting aspiration pneumonia. ED labs also revealed PH 7.169, LA 4.4, CPK 985. Patient was intubated in ED.  PCCM consulted.  Pertinent Medical History:  Hypertension  Significant Hospital Events: Including procedures, antibiotic start and stop dates in addition to other pertinent events   3/08 - Intubated in ED, pontine bleed/SAH. CT head 17:09 Acute large hemorrhage 4.1 cm in pons with intraventricular extension without hydrocephalus 3/09 - CTA 03:14 AM No change in IPH in pons, similar biparietal [name] 3/14 - Now awake, appears locked in can follow commands with eyes. 3/16 - Purposeful with left upper extremity  3/22 - Tracheostomy placed at bedside, bleeding issues overnight from trach site 3/24 - PEG (Dr. Sebastian) 3/24 - Sputum culture with MRSA 3/27  - Vomited. TF on hold. Abd film c/w ileus. Added reglan , got SSE. Did tolerate PSV all day  3/28 - BMs x2 after SSE day prior. Added back TFs at 1/2 rated. Tolerated PS 3/29 - Tolerated PS  3/31 - Tolerating CPAP PS 15/5, TF on hold due to ileus. 4/01 - Con't to hold tube feeds today, restarting vancomycin  and sending tracheal aspirate as peaks are 37, plat 24, driving 19. Thick secretions. Fever overnight.  4/02 - Peaks improved. Ongoing hiccups. Trickle feeding. Neuro exam unchanged.  4/20 - Trach was changed to cuffless #8 4/28 - Not safe to swallow. Limited communication and he follows simple commands. On TF 5/01 - On TC 28%, with large secretions. Working with speech and he is improving to initiate PMV trials under ST supervision   5/05 - On TC 30%, 7 L/min. Trach #6 cuffless, changed 5/1 to facilitate PMV trials  5/12 - On TC 21%, 6 L/min. Trach #6 cuffless, unable to produce sounds or speak on PMV. No significant resp secretions. On PEG TF 6/10 - Tracheostomy was removed due to failure to well provide adequate airway 02/15/2024 pulmonary critical care consult. 7/07 Patient obtunded and desaturating on NRB; transfer to icu for intubation; updated mother over phone who wants to reverse code status to full code; ent consulted 7/09 Remains intubated on no continuous sedation, right eye open but does not track movement  7/16 Revision ENT trach with [facility] Flap 7/18 Weaned on vent 5 hours  7/19 Tolerated trach collar for the majority of the day but had some increased work of breath  resulting in being placed back on vent support  7/20 No issues overnight, back on ATC this AM 7/21 Did not tolerate ATC. Placed back on PRVC. 7/24 ATC until afternoon and went on vent for WOB reportedly  7/25 ATC for a few hours  7/26 ATC x 6h 7/28 tolerating trach collar-tolerated for most of the day 7/30 on vent overnight and back on trach collar today 8/1 back on vent overnight  8/2 was able to maintain off of  ventilator overnight but by 6 AM today patient was seen with signs of increased work of breathing including tachycardia therefore patient was placed back on ventilator 8/3 remained on ventilator overnight to transition to trach collar this a.m. 8/4 did 18 hour ATC yesterday; back on vent 3am today 8/5 TC for 12-14 hours. Then went back to vent because of tachypnea.  8/6 tolerated ATC at 30% for 24 hours. Back on vent this AM for rest as was a bit tachypneic 8/11 been off vent >24 hours. 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  8/12-off vent for over 48 hours 8/18 completed abx  8/19 Changed to cuffless trach 8/20 Increasing tachypnea, hypercarbia.  Trach changed back to #6 cuffed and brought back to ICU to be placed on ventilator.  Antibiotics broadened to linezolid  and cefepime  8/23 PSV wean 8/24 eeg for twitching no sz 8/26 started TC 8/27 did TC overnight, hypercarbic on repeat gas but compensated 9/2 periods of apnea but overall doing ok ATC day vent night 9/8 continuing noct vent Atc day   Interim History / Subjective:  NAEO  ATC now  Hands were contracted  in fists, I softly opened them and placed them around his stuffed dogs  Objective:   Blood pressure 103/79, pulse 77, temperature 97.8 F (36.6 C), temperature source Oral, resp. rate (!) 0, height 5' 8 (1.727 m), weight 130 kg, SpO2 100%.    Vent Mode: PRVC FiO2 (%):  [28 %-40 %] 28 % Set Rate:  [12 bmp] 12 bmp Vt Set:  [550 mL] 550 mL PEEP:  [5 cmH20] 5 cmH20 Plateau Pressure:  [20 cmH20-22 cmH20] 22 cmH20   Intake/Output Summary (Last 24 hours) at 04/28/2024 9096 Last data filed at 04/28/2024 0800 Gross per 24 hour  Intake 1605 ml  Output 1315 ml  Net 290 ml   Filed Weights   04/26/24 0344 04/27/24 0413 04/28/24 0356  Weight: 130 kg 130 kg 130 kg   Physical Examination: General: Chronically ill middle aged M  HEENT: [place]. Trach secure. L eye with hazy appearing  cornea  Neuro: Eyes are spontaneously open. Does not follow command. Inconsistent flicker in BUE to pain.  CV: rrr  PULM: even unlabored on ATC  GI: soft  Extremities: some slight hand contractures    Resolved Problem List:  Post sedation hypotension Left eye conjunctivitis - resolved  Hyperglycemia, resolved Pseudomonas and E. coli tracheitis Hypernatremia  Assessment and Plan:   AoC resp failure w hypercarbia and hypoxia Trach dependence  -s/pt tracheostomy redo 7/16, initial tracheostomy placed back in May, had been downsized to a size 4, and patient could not maintain airway clearance. Not a candidate for decannulation given ineffective airway clearance -course since then has been c/b recurrent HCAP, variable vent need-- at times having decompensations req 24hr vent.  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 Would encourage ongoing measures to minimize contractures (nursing interventions v OT -- defer to primary)    Best Practice (right click and Reselect all SmartList Selections daily)   Per primary   Critical care time:    Cct n/a    Ronnald Gave MSN, AGACNP-BC [place] [facility] Amion for pager 04/28/2024, 9:53 AM

## 2024-04-29 DIAGNOSIS — Z930 Tracheostomy status: Secondary | ICD-10-CM | POA: Diagnosis not present

## 2024-04-29 DIAGNOSIS — I619 Nontraumatic intracerebral hemorrhage, unspecified: Secondary | ICD-10-CM | POA: Diagnosis not present

## 2024-04-29 LAB — GLUCOSE, CAPILLARY
Glucose-Capillary: 121 mg/dL — ABNORMAL HIGH (ref 70–99)
Glucose-Capillary: 138 mg/dL — ABNORMAL HIGH (ref 70–99)
Glucose-Capillary: 139 mg/dL — ABNORMAL HIGH (ref 70–99)
Glucose-Capillary: 150 mg/dL — ABNORMAL HIGH (ref 70–99)
Glucose-Capillary: 159 mg/dL — ABNORMAL HIGH (ref 70–99)
Glucose-Capillary: 96 mg/dL (ref 70–99)

## 2024-04-29 NOTE — Progress Notes (Signed)
 PROGRESS NOTE  Garrett [name] FMW:[id number] DOB: Jun 08, 1976 DOA: 10/27/2023 PCP: Pcp, No   LOS: 185 days   Brief narrative: 48 y/o male with past medical history of hypertension was found down at home agonal breathing.  History of uncontrolled hypertension but never had been to the doctor.  Patient was noted to have a large (4.1 cm) hemorrhage centered in the pons with intraventricular extension into the fourth ventricle and also extension into the basal cisterns and trace additional subarachnoid hemorrhage along the left parietal convexity. BP in ED was 262/156. CXR revealed RUL and perihilar infiltrates suggesting aspiration pneumonia. ED labs also revealed PH 7.169, LA 4.4, CPK 985. Patient was intubated in ED and was admitted hospital.  Patient has had prolonged hospitalization and sequence of events listed below.   Significant events: 3/08 - Intubated in ED, pontine bleed/SAH. CT head 17:09 Acute large hemorrhage 4.1 cm in pons with intraventricular extension without hydrocephalus 3/09 - CTA 03:14 AM No change in IPH in pons, similar biparietal [facility] 3/14 - Now awake, appears locked in can follow commands with eyes. 3/16 - Purposeful with left upper extremity  3/22 - Tracheostomy placed at bedside, bleeding issues overnight from trach site 3/24 - PEG (Dr. Sebastian) 3/24 - Sputum culture with MRSA 3/27 - Vomited. TF on hold. Abd film c/w ileus. Added reglan , got SSE. Did tolerate PSV all day  3/28 - BMs x2 after SSE day prior. Added back TFs at 1/2 rated. Tolerated PS 3/29 - Tolerated PS  3/31 - Tolerating CPAP PS 15/5, TF on hold due to ileus. 4/01 - Con't to hold tube feeds today, restarting vancomycin  and sending tracheal aspirate as peaks are 37, plat 24, driving 19. Thick secretions. Fever overnight.  4/02 - Peaks improved. Ongoing hiccups. Trickle feeding. Neuro exam unchanged.  4/20 - Trach was changed to cuffless #8 4/28 - Not safe to swallow. Limited communication and he  follows simple commands. On TF 5/01 - On TC 28%, with large secretions. Working with speech and he is improving to initiate PMV trials under ST supervision   5/05 - On TC 30%, 7 L/min. Trach #6 cuffless, changed 5/1 to facilitate PMV trials  5/12 - On TC 21%, 6 L/min. Trach #6 cuffless, unable to produce sounds or speak on PMV. No significant resp secretions. On PEG TF 6/10 - Tracheostomy was removed due to failure to well provide adequate airway 02/15/2024 pulmonary critical care consult. 7/07 Patient obtunded and desaturating on NRB; transfer to icu for intubation; updated mother over phone who wants to reverse code status to full code; ent consulted 7/09 Remains intubated on no continuous sedation, right eye open but does not track movement  7/16 Revision ENT trach with [facility] Flap 7/18 Weaned on vent 5 hours  7/19 Tolerated trach collar for the majority of the day but had some increased work of breath resulting in being placed back on vent support  7/20 No issues overnight, back on ATC this AM 7/21 Did not tolerate ATC. Placed back on PRVC. 7/24 ATC until afternoon and went on vent for WOB reportedly  7/25 ATC for a few hours  7/26 ATC x 6h 7/28 tolerating trach collar-tolerated for most of the day 7/30 on vent overnight and back on trach collar today 8/1 back on vent overnight  8/2 was able to maintain off of ventilator overnight but by 6 AM today patient was seen with signs of increased work of breathing including tachycardia therefore patient was placed back on ventilator 8/3  remained on ventilator overnight to transition to trach collar this a.m. 8/4 did 18 hour ATC yesterday; back on vent 3am today 8/5 TC for 12-14 hours. Then went back to vent because of tachypnea.  8/6 tolerated ATC at 30% for 24 hours. Back on vent this AM for rest as was a bit tachypneic 8/11 been off vent >24 hours. 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   8/12-off vent for over 48 hours 8/18 completed abx  8/19 Changed to cuffless trach 8/20 Increasing tachypnea, hypercarbia.  Trach changed back to #6 cuffed and brought back to ICU to be placed on ventilator.  Antibiotics broadened to linezolid  and cefepime  8/23 PSV wean 8/24 eeg for twitching no sz 8/26 started TC 8/27 did TC overnight, hypercarbic on repeat gas but compensated 04/18/2024: Last seen by ophthalmology. 04/19/2024: Transferred under [facility].  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  No new issues.   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 Not a candidate for decannulation given ineffective airway clearance, continue TC during day with full MV/ PRVC at night, now suspected to be night time vent dependent given recurrent compensated hypercarbia. Continue trach care per protocol.   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  Reassessed by ophthalmology again on 04/18/2024, recommendations to continue erythromycin  eye drops q2 per Optho.  Recommendation is to tape eyes with paper tape if remain open without blinking and to notify optho if worsening redness or opacification of left eye.     HTN Continue scheduled amlodipine , Lopressor  and as needed  hydralazine .  Blood pressure seems to be stable   Class III obesity. Nutrition Status:Body mass index is 43.58 kg/m.  Nutrition Problem: Inadequate oral intake Etiology: inability to eat Signs/Symptoms: NPO status Interventions: Tube feeding, Prostat, MVI, Juven    DVT prophylaxis: enoxaparin  (LOVENOX ) injection 40 mg Start: 03/06/24 1000 SCDs Start: 10/27/23 2226   Disposition: Uncertain at this time  Status is: Inpatient Remains inpatient appropriate because: Pending placement    Code Status:     Code Status: Full Code  Family Communication: None at bedside  Consultants: PCCM Ophthalmology  Procedures: Intubation, mechanical ventilation, tracheostomy placement PEG tube placement Foley catheter placement  Anti-infectives:  None currently  Subjective: Today, patient was seen and examined at bedside.  No interval complaints reported.  Objective: Vitals:   04/29/24 1044 04/29/24 1045  BP: (!) 124/92 (!) 124/92  Pulse:  80  Resp:    Temp:    SpO2:      Intake/Output Summary (Last 24 hours) at 04/29/2024 1057 Last data filed at 04/29/2024 1046 Gross per 24 hour  Intake 1548 ml  Output 1275 ml  Net 273 ml   Filed Weights   04/26/24 0344 04/27/24 0413 04/28/24 0356  Weight: 130 kg 130 kg 130 kg  Body mass index is 43.58 kg/m.   Physical Exam:  General: Obese built, not in obvious distress, on trach collar, opens eyes spontaneously HENT:   No scleral pallor or icterus noted. Oral mucosa is moist.  Tracheostomy in place, left eye redness. Chest:    Diminished breath sounds bilaterally.  CVS: S1 &S2 heard. No murmur.  Regular rate and rhythm. Abdomen: Soft, nontender obese, PEG tube in place, urethral catheter in place bowel sounds are heard.   Extremities: No cyanosis, clubbing or edema.  Peripheral pulses are palpable. Psych: Spontaneous eye opening, locked-in state, persistent vegetative state CNS: Locked-in state. Skin: Warm and dry.     Data  Review: I have personally reviewed the following laboratory data and studies,  CBC: Recent Labs  Lab 04/28/24 0920  WBC 7.4  HGB 11.5*  HCT 39.6  MCV 91.9  PLT 334   Basic Metabolic Panel: Recent Labs  Lab 04/24/24 0912 04/26/24 0757  NA 144 141  K 3.7 4.1  CL 104 103  CO2 33* 29  GLUCOSE 117* 112*  BUN 27* 28*  CREATININE 0.52* 0.47*  CALCIUM 9.6 9.6   Liver Function Tests: No results for input(s): AST, ALT, ALKPHOS, BILITOT, PROT, ALBUMIN  in the last 168 hours. No results for input(s): LIPASE, AMYLASE in the last 168 hours. No results for input(s): AMMONIA in the last 168 hours. Cardiac Enzymes: No results for input(s): CKTOTAL, CKMB, CKMBINDEX, TROPONINI in the last 168 hours. BNP (last 3 results) Recent Labs    10/27/23 1820  BNP 210.4*    ProBNP (last 3 results) No results for input(s): PROBNP in the last 8760 hours.  CBG: Recent Labs  Lab 04/28/24 1509 04/28/24 1930 04/28/24 2338 04/29/24 0325 04/29/24 0725  GLUCAP 88 115* 116* 96 150*   No results found for this or any previous visit (from the past 240 hours).   Studies: No results found.    Garrett Beckom, MD  [facility] 04/29/2024  If 7PM-7AM, please contact night-coverage

## 2024-04-29 NOTE — Progress Notes (Signed)
 [facility] night cross cover note:   I was notified by the patient's RN that tele was suggesting potential ST elevation. STAT EKG obtained. Compared to most recent prior EKG on 04/17/24, this updated EKG shows SR, heart rate 89, normal intervals, nonspecific T wave flattening in aVL, unchanged from most recent prior EKG, while also showing less than 1 mm ST elevation in V2, which also appears unchanged from most recent prior EKG, while demonstrating no evidence of new ST changes, including no evidence of STEMI.   Eva Pore, DO Hospitalist

## 2024-04-29 NOTE — Plan of Care (Signed)
  Problem: Intracerebral Hemorrhage Tissue Perfusion: Goal: Complications of Intracerebral Hemorrhage will be minimized Outcome: Progressing   Problem: Nutrition: Goal: Risk of aspiration will decrease Outcome: Progressing Goal: Dietary intake will improve Outcome: Progressing   Problem: Fluid Volume: Goal: Ability to maintain a balanced intake and output will improve Outcome: Progressing   Problem: Skin Integrity: Goal: Risk for impaired skin integrity will decrease Outcome: Progressing   Problem: Tissue Perfusion: Goal: Adequacy of tissue perfusion will improve Outcome: Progressing   Problem: Clinical Measurements: Goal: Ability to maintain clinical measurements within normal limits will improve Outcome: Progressing Goal: Will remain free from infection Outcome: Progressing Goal: Diagnostic test results will improve Outcome: Progressing Goal: Respiratory complications will improve Outcome: Progressing Goal: Cardiovascular complication will be avoided Outcome: Progressing   Problem: Activity: Goal: Risk for activity intolerance will decrease Outcome: Progressing   Problem: Nutrition: Goal: Adequate nutrition will be maintained Outcome: Progressing   Problem: Elimination: Goal: Will not experience complications related to bowel motility Outcome: Progressing Goal: Will not experience complications related to urinary retention Outcome: Progressing   Problem: Pain Managment: Goal: General experience of comfort will improve and/or be controlled Outcome: Progressing   Problem: Safety: Goal: Ability to remain free from injury will improve Outcome: Progressing   Problem: Skin Integrity: Goal: Risk for impaired skin integrity will decrease Outcome: Progressing   Problem: Activity: Goal: Ability to tolerate increased activity will improve Outcome: Progressing   Problem: Respiratory: Goal: Patent airway maintenance will improve Outcome: Progressing   Problem:  Activity: Goal: Ability to tolerate increased activity will improve Outcome: Progressing   Problem: Respiratory: Goal: Ability to maintain a clear airway and adequate ventilation will improve Outcome: Progressing   Problem: Role Relationship: Goal: Method of communication will improve Outcome: Progressing   Problem: Education: Goal: Knowledge of disease or condition will improve Outcome: Progressing   Problem: Intracerebral Hemorrhage Tissue Perfusion: Goal: Complications of Intracerebral Hemorrhage will be minimized Outcome: Progressing   Problem: Self-Care: Goal: Ability to participate in self-care as condition permits will improve Outcome: Progressing Goal: Verbalization of feelings and concerns over difficulty with self-care will improve Outcome: Progressing Goal: Ability to communicate needs accurately will improve Outcome: Progressing   Problem: Nutrition: Goal: Risk of aspiration will decrease Outcome: Progressing Goal: Dietary intake will improve Outcome: Progressing   Problem: Education: Goal: Knowledge of disease or condition will improve Outcome: Progressing Goal: Knowledge of secondary prevention will improve (MUST DOCUMENT ALL) Outcome: Progressing Goal: Knowledge of patient specific risk factors will improve (DELETE if not current risk factor) Outcome: Progressing   Problem: Intracerebral Hemorrhage Tissue Perfusion: Goal: Complications of Intracerebral Hemorrhage will be minimized Outcome: Progressing   Problem: Coping: Goal: Will verbalize positive feelings about self Outcome: Progressing Goal: Will identify appropriate support needs Outcome: Progressing   Problem: Health Behavior/Discharge Planning: Goal: Ability to manage health-related needs will improve Outcome: Progressing Goal: Goals will be collaboratively established with patient/family Outcome: Progressing   Problem: Self-Care: Goal: Ability to participate in self-care as condition  permits will improve Outcome: Progressing Goal: Verbalization of feelings and concerns over difficulty with self-care will improve Outcome: Progressing Goal: Ability to communicate needs accurately will improve Outcome: Progressing   Problem: Nutrition: Goal: Risk of aspiration will decrease Outcome: Progressing Goal: Dietary intake will improve Outcome: Progressing

## 2024-04-30 DIAGNOSIS — J9601 Acute respiratory failure with hypoxia: Secondary | ICD-10-CM | POA: Diagnosis not present

## 2024-04-30 DIAGNOSIS — Z930 Tracheostomy status: Secondary | ICD-10-CM | POA: Diagnosis not present

## 2024-04-30 DIAGNOSIS — I619 Nontraumatic intracerebral hemorrhage, unspecified: Secondary | ICD-10-CM | POA: Diagnosis not present

## 2024-04-30 DIAGNOSIS — Z789 Other specified health status: Secondary | ICD-10-CM | POA: Diagnosis not present

## 2024-04-30 DIAGNOSIS — I613 Nontraumatic intracerebral hemorrhage in brain stem: Secondary | ICD-10-CM | POA: Diagnosis not present

## 2024-04-30 DIAGNOSIS — J9611 Chronic respiratory failure with hypoxia: Secondary | ICD-10-CM | POA: Diagnosis not present

## 2024-04-30 LAB — GLUCOSE, CAPILLARY
Glucose-Capillary: 94 mg/dL (ref 70–99)
Glucose-Capillary: 116 mg/dL — ABNORMAL HIGH (ref 70–99)
Glucose-Capillary: 99 mg/dL (ref 70–99)
Glucose-Capillary: 147 mg/dL — ABNORMAL HIGH (ref 70–99)
Glucose-Capillary: 144 mg/dL — ABNORMAL HIGH (ref 70–99)
Glucose-Capillary: 84 mg/dL (ref 70–99)

## 2024-04-30 MED ORDER — INSULIN ASPART 100 UNIT/ML IJ SOLN
0.0000 [IU] | Freq: Four times a day (QID) | INTRAMUSCULAR | Status: DC
  Administered 2024-04-30 – 2024-05-01 (×2): 1 [IU] via SUBCUTANEOUS
  Administered 2024-05-01: 2 [IU] via SUBCUTANEOUS
  Administered 2024-05-02 (×2): 1 [IU] via SUBCUTANEOUS
  Administered 2024-05-02: 2 [IU] via SUBCUTANEOUS
  Administered 2024-05-03 – 2024-05-04 (×3): 1 [IU] via SUBCUTANEOUS
  Administered 2024-05-05 (×2): 2 [IU] via SUBCUTANEOUS
  Administered 2024-05-06 – 2024-05-16 (×19): 1 [IU] via SUBCUTANEOUS
  Administered 2024-05-16: 2 [IU] via SUBCUTANEOUS
  Administered 2024-05-17 – 2024-05-19 (×4): 1 [IU] via SUBCUTANEOUS
  Administered 2024-05-19: 2 [IU] via SUBCUTANEOUS
  Administered 2024-05-20 – 2024-05-21 (×3): 1 [IU] via SUBCUTANEOUS
  Administered 2024-05-21 – 2024-05-22 (×3): 2 [IU] via SUBCUTANEOUS
  Administered 2024-05-22 – 2024-05-27 (×11): 1 [IU] via SUBCUTANEOUS
  Administered 2024-05-27: 2 [IU] via SUBCUTANEOUS
  Administered 2024-05-28 – 2024-06-02 (×11): 1 [IU] via SUBCUTANEOUS
  Administered 2024-06-02: 2 [IU] via SUBCUTANEOUS
  Administered 2024-06-02 – 2024-06-03 (×3): 1 [IU] via SUBCUTANEOUS
  Administered 2024-06-03: 2 [IU] via SUBCUTANEOUS
  Administered 2024-06-04 – 2024-06-11 (×13): 1 [IU] via SUBCUTANEOUS
  Administered 2024-06-12: 2 [IU] via SUBCUTANEOUS
  Administered 2024-06-13 – 2024-06-14 (×3): 1 [IU] via SUBCUTANEOUS
  Administered 2024-06-14: 2 [IU] via SUBCUTANEOUS
  Administered 2024-06-14 – 2024-06-17 (×6): 1 [IU] via SUBCUTANEOUS
  Administered 2024-06-17: 2 [IU] via SUBCUTANEOUS
  Administered 2024-06-17 – 2024-06-20 (×5): 1 [IU] via SUBCUTANEOUS
  Administered 2024-06-20: 2 [IU] via SUBCUTANEOUS
  Administered 2024-06-20: 1 [IU] via SUBCUTANEOUS

## 2024-04-30 NOTE — Progress Notes (Signed)
 Progress Note   Patient: Garrett [name] FMW:[id number] DOB: June 28, 1976 DOA: 10/27/2023     186 DOS: the patient was seen and examined on 04/30/2024   Brief hospital course: Garrett [name] 48 y/o male with past medical history of hypertension was found down at home agonal breathing. History of uncontrolled hypertension but never had been to the doctor. Patient was noted to have a large (4.1 cm) hemorrhage centered in the pons with intraventricular extension into the fourth ventricle and also extension into the basal cisterns and trace additional subarachnoid hemorrhage along the left parietal convexity. BP in ED was 262/156. CXR revealed RUL and perihilar infiltrates suggesting aspiration pneumonia. ED labs also revealed PH 7.169, LA 4.4, CPK 985. Patient was intubated in ED and was admitted hospital.   He had prolonged complicated hospital stay pontine hemorrhage, biparietal SAH.  Patient's mental status poor, s/p tracheostomy and PEG placement.  Sputum cultures positive for MRSA on contact precautions.  Patient had prolonged difficulty weaning from vent.  Patient is on trach collar for few days but later required ventilatory support mostly at nights and is currently in ICU.  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  No new issues.   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 Not a candidate for decannulation given ineffective airway clearance, continue trach collar during day with full MV/ PRVC at night, now suspected to be night time vent dependent given recurrent compensated hypercarbia. Continue trach care per protocol.   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  Reassessed by ophthalmology  again on 04/18/2024, recommendations to continue erythromycin  eye drops q2 per Optho.  Recommendation is to tape eyes with paper tape if remain open without blinking and to notify optho if worsening redness or opacification of left eye.     Hypertension Continue amlodipine , Lopressor  and as needed hydralazine .  Blood pressure seems to be stable.  Nutrition Documentation    Flowsheet Row ED to Hosp-Admission (Current) from 10/27/2023 in [facility]  Nutrition Problem Inadequate oral intake  Etiology inability to eat  Nutrition Goal Patient will meet greater than or equal to 90% of their needs  Interventions Tube feeding, Prostat, MVI, Juven   Nursing supportive care. Fall, aspiration precautions. Diet:  Diet Orders (From admission, onward)     Start     Ordered   04/09/24 0737  Diet NPO time specified  Diet effective now        04/09/24 0737           DVT prophylaxis: enoxaparin  (LOVENOX ) injection 40 mg Start: 03/06/24 1000 SCDs Start: 10/27/23 2226  Level of care: ICU   Code Status: Full Code  Subjective: Patient is seen and examined today morning. He has poor mental status. Left eye discharge noted. Trach collar on. RN at bedside, no family seen. Sugars stable. Tolerating feeds well.  Physical Exam: Vitals:   04/30/24 1529 04/30/24 1600 04/30/24 1615 04/30/24 1700  BP:  (!) 126/96  (!) 127/97  Pulse:  89 (!) 101 92  Resp:  (!) 21 (!) 21 (!) 26  Temp: 98.5 F (36.9 C)     TempSrc: Axillary     SpO2:  98%  99%  Weight:      Height:  General - Middle aged African American obese ill male, s/p trach, peg. HEENT - PERRLA, EOMI, atraumatic head, non tender sinuses. Lung - Clear, basal rales, rhonchi, no wheezes, on trach collar. Heart - S1, S2 heard, no murmurs, rubs, trace pedal edema. Abdomen - Soft, non tender, peg tube intact Neuro - poor mental status, unable to do full neuro exam. Skin - Warm and dry.  Data Reviewed:      Latest  Ref Rng & Units 04/28/2024    9:20 AM 04/21/2024    7:46 AM 04/16/2024    8:06 AM  CBC  WBC 4.0 - 10.5 K/uL 7.4  6.9    Hemoglobin 13.0 - 17.0 g/dL 88.4  89.3  88.3   Hematocrit 39.0 - 52.0 % 39.6  34.9  34.0   Platelets 150 - 400 K/uL 334  409        Latest Ref Rng & Units 04/26/2024    7:57 AM 04/24/2024    9:12 AM 04/22/2024    8:38 AM  BMP  Glucose 70 - 99 mg/dL 887  882  869   BUN 6 - 20 mg/dL 28  27  34   Creatinine 0.61 - 1.24 mg/dL 9.52  9.47  9.36   Sodium 135 - 145 mmol/L 141  144  143   Potassium 3.5 - 5.1 mmol/L 4.1  3.7  3.7   Chloride 98 - 111 mmol/L 103  104  105   CO2 22 - 32 mmol/L 29  33  25   Calcium 8.9 - 10.3 mg/dL 9.6  9.6  9.5    No results found.  Family Communication: no family at bedside  Disposition: Status is: Inpatient Remains inpatient appropriate because: severity of illness  Planned Discharge Destination: LTAC     Time spent: 52 minutes  Author: Concepcion Riser, MD 04/30/2024 5:48 PM Secure chat 7am to 7pm For on call review [email or web address].

## 2024-05-01 DIAGNOSIS — Z930 Tracheostomy status: Secondary | ICD-10-CM | POA: Diagnosis not present

## 2024-05-01 DIAGNOSIS — I613 Nontraumatic intracerebral hemorrhage in brain stem: Secondary | ICD-10-CM | POA: Diagnosis not present

## 2024-05-01 DIAGNOSIS — J9601 Acute respiratory failure with hypoxia: Secondary | ICD-10-CM | POA: Diagnosis not present

## 2024-05-01 DIAGNOSIS — J9611 Chronic respiratory failure with hypoxia: Secondary | ICD-10-CM | POA: Diagnosis not present

## 2024-05-01 DIAGNOSIS — Z789 Other specified health status: Secondary | ICD-10-CM | POA: Diagnosis not present

## 2024-05-01 DIAGNOSIS — I619 Nontraumatic intracerebral hemorrhage, unspecified: Secondary | ICD-10-CM | POA: Diagnosis not present

## 2024-05-01 LAB — GLUCOSE, CAPILLARY
Glucose-Capillary: 100 mg/dL — ABNORMAL HIGH (ref 70–99)
Glucose-Capillary: 91 mg/dL (ref 70–99)
Glucose-Capillary: 92 mg/dL (ref 70–99)
Glucose-Capillary: 140 mg/dL — ABNORMAL HIGH (ref 70–99)
Glucose-Capillary: 116 mg/dL — ABNORMAL HIGH (ref 70–99)
Glucose-Capillary: 104 mg/dL — ABNORMAL HIGH (ref 70–99)
Glucose-Capillary: 153 mg/dL — ABNORMAL HIGH (ref 70–99)

## 2024-05-01 NOTE — TOC Progression Note (Signed)
 Transition of Care [facility]) - Progression Note    Patient Details  Name: Garrett [name] MRN: [id number] Date of Birth: Apr 28, 1976  [facility]) CM/SW Contact  Lendia Dais, [place] Phone Number: 05/01/2024, 12:06 PM  Clinical Narrative:  Barriers remain in place of placement due to pt using vent at night.  CSW inquired MD about resuming palliative due to the patients residence in the ICU and absence of progression.  CSW will continue to follow.     Expected Discharge Plan: Long Term Nursing Home Barriers to Discharge: Continued Medical Work up, SNF Pending Medicaid, SNF Pending bed offer, SNF Pending payor source - LOG, Inadequate or no insurance (New trach)               Expected Discharge Plan and Services In-house Referral: Clinical Social Work, Hospice / Palliative Care   Post Acute Care Choice: Skilled Nursing Facility Living arrangements for the past 2 months: Single Family Home                                       Social Drivers of Health (SDOH) Interventions SDOH Screenings   Food Insecurity: Patient Unable To Answer (10/29/2023)  Social Connections: Unknown (06/23/2023)   Received from [facility]  Tobacco Use: High Risk (10/31/2023)    Readmission Risk Interventions     No data to display

## 2024-05-01 NOTE — Progress Notes (Signed)
 Progress Note   Patient: Garrett [name] FMW:[id number] DOB: 1975-09-29 DOA: 10/27/2023     187 DOS: the patient was seen and examined on 05/01/2024   Brief hospital course: Garrett [name] 48 y/o male with past medical history of hypertension was found down at home agonal breathing. History of uncontrolled hypertension but never had been to the doctor. Patient was noted to have a large (4.1 cm) hemorrhage centered in the pons with intraventricular extension into the fourth ventricle and also extension into the basal cisterns and trace additional subarachnoid hemorrhage along the left parietal convexity. BP in ED was 262/156. CXR revealed RUL and perihilar infiltrates suggesting aspiration pneumonia. ED labs also revealed PH 7.169, LA 4.4, CPK 985. Patient was intubated in ED and was admitted hospital.   He had prolonged complicated hospital stay pontine hemorrhage, biparietal SAH.  Patient's mental status poor, s/p tracheostomy and PEG placement.  Sputum cultures positive for MRSA on contact precautions.  Patient had prolonged difficulty weaning from vent.  Patient is on trach collar for few days but later required ventilatory support mostly at nights and is currently in ICU.  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 No new issues.   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 Not a candidate for decannulation given ineffective airway clearance, Continue trach collar during day with vent support at night.  Continue trach care per protocol.   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  Reassessed by ophthalmology again on 04/18/2024, recommendations to continue erythromycin  eye drops q2, tape eyes  with paper tape if remain open without blinking and to notify optho if worsening redness or opacification of left eye. Discussed with RN.   Hypertension Continue amlodipine , Lopressor  and as needed hydralazine .  Blood pressure seems to be stable.  Nutrition Documentation    Flowsheet Row ED to Hosp-Admission (Current) from 10/27/2023 in [facility]  Nutrition Problem Inadequate oral intake  Etiology inability to eat  Nutrition Goal Patient will meet greater than or equal to 90% of their needs  Interventions Tube feeding, Prostat, MVI, Juven   Nursing supportive care. Fall, aspiration precautions. Diet:  Diet Orders (From admission, onward)     Start     Ordered   04/09/24 0737  Diet NPO time specified  Diet effective now        04/09/24 0737           DVT prophylaxis: enoxaparin  (LOVENOX ) injection 40 mg Start: 03/06/24 1000 SCDs Start: 10/27/23 2226  Level of care: ICU   Code Status: Full Code  Subjective: Patient is seen and examined today morning. Trach collar on. No family at bedside. Tolerating feeds well.  Physical Exam: Vitals:   05/01/24 1451 05/01/24 1500 05/01/24 1534 05/01/24 1600  BP:  124/87  127/86  Pulse: 89 89  88  Resp: (!) 22 (!) 25  (!) 23  Temp:   98.8 F (37.1 C)   TempSrc:   Axillary   SpO2:  97%  94%  Weight:      Height:        General - Middle aged African American obese ill male, s/p trach, peg. HEENT - PERRLA, EOMI, atraumatic head, non tender sinuses. Lung - Clear, basal rales, rhonchi, no wheezes, on trach collar.  Heart - S1, S2 heard, no murmurs, rubs, trace pedal edema. Abdomen - Soft, non tender, peg tube intact Neuro - poor mental status, unable to do full neuro exam. Skin - Warm and dry.  Data Reviewed:      Latest Ref Rng & Units 04/28/2024    9:20 AM 04/21/2024    7:46 AM 04/16/2024    8:06 AM  CBC  WBC 4.0 - 10.5 K/uL 7.4  6.9    Hemoglobin 13.0 - 17.0 g/dL 88.4  89.3  88.3   Hematocrit 39.0 - 52.0  % 39.6  34.9  34.0   Platelets 150 - 400 K/uL 334  409        Latest Ref Rng & Units 04/26/2024    7:57 AM 04/24/2024    9:12 AM 04/22/2024    8:38 AM  BMP  Glucose 70 - 99 mg/dL 887  882  869   BUN 6 - 20 mg/dL 28  27  34   Creatinine 0.61 - 1.24 mg/dL 9.52  9.47  9.36   Sodium 135 - 145 mmol/L 141  144  143   Potassium 3.5 - 5.1 mmol/L 4.1  3.7  3.7   Chloride 98 - 111 mmol/L 103  104  105   CO2 22 - 32 mmol/L 29  33  25   Calcium 8.9 - 10.3 mg/dL 9.6  9.6  9.5    No results found.  Family Communication: discussed with mother over phone. Advised palliative goals of care discussion, she agreed.  Disposition: Status is: Inpatient Remains inpatient appropriate because: severity of illness  Planned Discharge Destination: LTAC     Time spent: 51 minutes  Author: Concepcion Riser, MD 05/01/2024 4:31 PM Secure chat 7am to 7pm For on call review [email or web address].

## 2024-05-02 DIAGNOSIS — Z930 Tracheostomy status: Secondary | ICD-10-CM | POA: Diagnosis not present

## 2024-05-02 DIAGNOSIS — Z7189 Other specified counseling: Secondary | ICD-10-CM | POA: Diagnosis not present

## 2024-05-02 DIAGNOSIS — I613 Nontraumatic intracerebral hemorrhage in brain stem: Secondary | ICD-10-CM | POA: Diagnosis not present

## 2024-05-02 DIAGNOSIS — J9611 Chronic respiratory failure with hypoxia: Secondary | ICD-10-CM | POA: Diagnosis not present

## 2024-05-02 DIAGNOSIS — Z515 Encounter for palliative care: Secondary | ICD-10-CM | POA: Diagnosis not present

## 2024-05-02 DIAGNOSIS — I619 Nontraumatic intracerebral hemorrhage, unspecified: Secondary | ICD-10-CM | POA: Diagnosis not present

## 2024-05-02 DIAGNOSIS — J9601 Acute respiratory failure with hypoxia: Secondary | ICD-10-CM | POA: Diagnosis not present

## 2024-05-02 DIAGNOSIS — Z931 Gastrostomy status: Secondary | ICD-10-CM | POA: Diagnosis not present

## 2024-05-02 LAB — GLUCOSE, CAPILLARY
Glucose-Capillary: 95 mg/dL (ref 70–99)
Glucose-Capillary: 108 mg/dL — ABNORMAL HIGH (ref 70–99)
Glucose-Capillary: 193 mg/dL — ABNORMAL HIGH (ref 70–99)
Glucose-Capillary: 132 mg/dL — ABNORMAL HIGH (ref 70–99)
Glucose-Capillary: 138 mg/dL — ABNORMAL HIGH (ref 70–99)
Glucose-Capillary: 103 mg/dL — ABNORMAL HIGH (ref 70–99)
Glucose-Capillary: 154 mg/dL — ABNORMAL HIGH (ref 70–99)

## 2024-05-02 NOTE — Plan of Care (Signed)
  Problem: Skin Integrity: Goal: Risk for impaired skin integrity will decrease Outcome: Progressing   Problem: Clinical Measurements: Goal: Ability to maintain clinical measurements within normal limits will improve Outcome: Progressing Goal: Will remain free from infection Outcome: Progressing Goal: Diagnostic test results will improve Outcome: Progressing Goal: Respiratory complications will improve Outcome: Progressing Goal: Cardiovascular complication will be avoided Outcome: Progressing   Problem: Nutrition: Goal: Adequate nutrition will be maintained Outcome: Progressing   Problem: Intracerebral Hemorrhage Tissue Perfusion: Goal: Complications of Intracerebral Hemorrhage will be minimized Outcome: Not Progressing

## 2024-05-02 NOTE — Progress Notes (Signed)
 Palliative Medicine Progress Note   Patient Name: Garrett [name]       Date: 05/02/2024 DOB: Dec 21, 1975  Age: 48 y.o. MRN#: [id number] Attending Physician: Darci Pore, MD Primary Care Physician: Pcp, No Admit Date: 10/27/2023  Reason for Consultation/Follow-up: {Reason for Consult:23484}  HPI/Patient Profile: 48 year old male with past medical history of hypertension who presented to the ED on 10/27/2023 after he was found down at home.  He was found to have an acute large intracranial hemorrhage centered in the pons with intraventricular extension into the fourth ventricle and basal cisterns.  He was initially intubated.  After extensive discussion, family decided to proceed with tracheostomy, initially placed on 11/08/23 and replaced on 01/05/24.  PEG tube placed 11/08/2023.   Patient remains poorly responsive. [name] was de-cannulated 01/29/24 due to difficulty with passing suction catheter.   LOS - 124 days   PMT was initially consulted 10/30/2023 for discussion about goals of care with regard to tracheostomy versus one-way extubation. Signed off 11/18/23. PMT re-consulted on 02/06/24 to revisit GOC with family, in the setting of minimal clinical improvement.   Subjective: ***  Objective:  Physical Exam Vitals reviewed.  Constitutional:      General: He is not in acute distress. Cardiovascular:     Rate and Rhythm: Normal rate.  Pulmonary:     Comments: Trach collar Neurological:     Comments: Does not track or follow commands             Vital Signs: BP 128/88   Pulse 93   Temp 99.9 F (37.7 C) (Axillary)   Resp 18   Ht 5' 8 (1.727 m)   Wt 92 kg   SpO2 94%   BMI 30.84 kg/m  SpO2: SpO2: 94 % O2 Device: O2 Device: Tracheostomy Collar O2 Flow Rate: O2 Flow Rate  (L/min): 6 L/min  Intake/output summary:  Intake/Output Summary (Last 24 hours) at 05/02/2024 1453 Last data filed at 05/02/2024 1117 Gross per 24 hour  Intake 750 ml  Output 630 ml  Net 120 ml    LBM: Last BM Date : 04/30/24     Palliative Assessment/Data: ***     Palliative Medicine Assessment & Plan   Assessment: Principal Problem:   ICH (intracerebral hemorrhage) (HCC) Active Problems:   Advanced care planning/counseling discussion   Tracheostomy status (HCC)  Pressure injury of skin   Goals of care, counseling/discussion   Poor prognosis   Chronic respiratory failure with hypoxia (HCC)   Acute respiratory failure (HCC)   Chronic hypoxic respiratory failure (HCC)   Pontine hemorrhage (HCC)    Recommendations/Plan: ***  Goals of Care and Additional Recommendations: Limitations on Scope of Treatment: {Recommended Scope and Preferences:21019}  Code Status:   Prognosis:  {Palliative Care Prognosis:23504}  Discharge Planning: {Palliative dispostion:23505}  Care plan was discussed with ***  Thank you for allowing the Palliative Medicine Team to assist in the care of this patient.   ***   Recardo KATHEE Loll, NP   Please contact Palliative Medicine Team phone at [phone] for questions and concerns.  For individual providers, please see AMION.

## 2024-05-02 NOTE — Progress Notes (Signed)
 Progress Note   Patient: Garrett [name] [id number] DOB: November 28, 1975 DOA: 10/27/2023     188 DOS: the patient was seen and examined on 05/02/2024   Brief hospital course: Garrett [name] 48 y/o male with past medical history of hypertension was found down at home agonal breathing. History of uncontrolled hypertension but never had been to the doctor. Patient was noted to have a large (4.1 cm) hemorrhage centered in the pons with intraventricular extension into the fourth ventricle and also extension into the basal cisterns and trace additional subarachnoid hemorrhage along the left parietal convexity. BP in ED was 262/156. CXR revealed RUL and perihilar infiltrates suggesting aspiration pneumonia. ED labs also revealed PH 7.169, LA 4.4, CPK 985. Patient was intubated in ED and was admitted hospital.   He had prolonged complicated hospital stay pontine hemorrhage, biparietal SAH.  Patient's mental status poor, s/p tracheostomy and PEG placement.  Sputum cultures positive for MRSA on contact precautions.  Patient had prolonged difficulty weaning from vent.  Patient is on trach collar for few days but later required ventilatory support mostly at nights and is currently in ICU.  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 No new issues.   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 Not a candidate for decannulation given ineffective airway clearance, Continue trach collar during day with vent support at night.  Continue trach care per protocol.   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  Reassessed by ophthalmology again on 04/18/2024, recommendations to continue erythromycin  eye drops q2, tape eyes  with paper tape if remain open without blinking. Given concern for worsening redness or opacification of left eye, I discussed with Dr. Pecen who will evaluate him.   Hypertension Continue amlodipine , Lopressor  and as needed hydralazine .  Blood pressure seems to be stable.  Nutrition Documentation    Flowsheet Row ED to Hosp-Admission (Current) from 10/27/2023 in [place] [facility]  Nutrition Problem Inadequate oral intake  Etiology inability to eat  Nutrition Goal Patient will meet greater than or equal to 90% of their needs  Interventions Tube feeding, Prostat, MVI, Juven   Palliative discussion for goals of care and disposition plan. Nursing supportive care. Fall, aspiration precautions. Diet:  Diet Orders (From admission, onward)     Start     Ordered   04/09/24 0737  Diet NPO time specified  Diet effective now        04/09/24 0737           DVT prophylaxis: enoxaparin  (LOVENOX ) injection 40 mg Start: 03/06/24 1000 SCDs Start: 10/27/23 2226  Level of care: ICU   Code Status: Full Code  Subjective: Patient is seen and examined today morning. No family at bedside. Tolerating feeds well. Redness left eye noted.   Physical Exam: Vitals:   05/02/24 1159 05/02/24 1200 05/02/24 1300 05/02/24 1400  BP: 128/88 128/88    Pulse: 90 88 89 93  Resp: 16 17 19 18   Temp:      TempSrc:      SpO2: 96% 96% 93% 94%  Weight:      Height:        General - Middle aged African American obese ill male, s/p trach, peg. HEENT - PERRLA, EOMI, left eye redness noted, non tender sinuses.  Lung - Clear, basal rales, rhonchi, no wheezes, on trach collar. Heart - S1, S2 heard, no murmurs, rubs, trace pedal edema. Abdomen - Soft, non tender, peg tube intact Neuro - poor mental status, unable to do full neuro exam. Skin - Warm and dry.  Data Reviewed:      Latest Ref Rng & Units 04/28/2024    9:20 AM 04/21/2024    7:46 AM 04/16/2024    8:06 AM  CBC  WBC 4.0 - 10.5 K/uL 7.4   6.9    Hemoglobin 13.0 - 17.0 g/dL 88.4  89.3  88.3   Hematocrit 39.0 - 52.0 % 39.6  34.9  34.0   Platelets 150 - 400 K/uL 334  409        Latest Ref Rng & Units 04/26/2024    7:57 AM 04/24/2024    9:12 AM 04/22/2024    8:38 AM  BMP  Glucose 70 - 99 mg/dL 887  882  869   BUN 6 - 20 mg/dL 28  27  34   Creatinine 0.61 - 1.24 mg/dL 9.52  9.47  9.36   Sodium 135 - 145 mmol/L 141  144  143   Potassium 3.5 - 5.1 mmol/L 4.1  3.7  3.7   Chloride 98 - 111 mmol/L 103  104  105   CO2 22 - 32 mmol/L 29  33  25   Calcium 8.9 - 10.3 mg/dL 9.6  9.6  9.5    No results found.  Family Communication: discussed with mother over phone yesterday. Advised palliative goals of care discussion, she agreed.  Disposition: Status is: Inpatient Remains inpatient appropriate because: severity of illness  Planned Discharge Destination: LTAC     Time spent: 47 minutes  Author: Concepcion Riser, MD 05/02/2024 3:11 PM Secure chat 7am to 7pm For on call review [email or web address].

## 2024-05-03 DIAGNOSIS — I619 Nontraumatic intracerebral hemorrhage, unspecified: Secondary | ICD-10-CM | POA: Diagnosis not present

## 2024-05-03 DIAGNOSIS — J9611 Chronic respiratory failure with hypoxia: Secondary | ICD-10-CM | POA: Diagnosis not present

## 2024-05-03 DIAGNOSIS — J9601 Acute respiratory failure with hypoxia: Secondary | ICD-10-CM | POA: Diagnosis not present

## 2024-05-03 DIAGNOSIS — Z930 Tracheostomy status: Secondary | ICD-10-CM | POA: Diagnosis not present

## 2024-05-03 DIAGNOSIS — Z931 Gastrostomy status: Secondary | ICD-10-CM | POA: Diagnosis not present

## 2024-05-03 DIAGNOSIS — I613 Nontraumatic intracerebral hemorrhage in brain stem: Secondary | ICD-10-CM | POA: Diagnosis not present

## 2024-05-03 LAB — BASIC METABOLIC PANEL WITH GFR
Anion gap: 13 (ref 5–15)
BUN: 29 mg/dL — ABNORMAL HIGH (ref 6–20)
CO2: 28 mmol/L (ref 22–32)
Calcium: 9.4 mg/dL (ref 8.9–10.3)
Chloride: 103 mmol/L (ref 98–111)
Creatinine, Ser: 0.52 mg/dL — ABNORMAL LOW (ref 0.61–1.24)
GFR, Estimated: >60 mL/min (ref >60)
Glucose, Bld: 106 mg/dL — ABNORMAL HIGH (ref 70–99)
Potassium: 4.3 mmol/L (ref 3.5–5.1)
Sodium: 144 mmol/L (ref 135–145)

## 2024-05-03 LAB — GLUCOSE, CAPILLARY
Glucose-Capillary: 115 mg/dL — ABNORMAL HIGH (ref 70–99)
Glucose-Capillary: 148 mg/dL — ABNORMAL HIGH (ref 70–99)
Glucose-Capillary: 131 mg/dL — ABNORMAL HIGH (ref 70–99)
Glucose-Capillary: 129 mg/dL — ABNORMAL HIGH (ref 70–99)
Glucose-Capillary: 116 mg/dL — ABNORMAL HIGH (ref 70–99)
Glucose-Capillary: 93 mg/dL (ref 70–99)

## 2024-05-03 NOTE — Progress Notes (Signed)
 Progress Note   Patient: Garrett [name] [id number] DOB: 11/08/1975 DOA: 10/27/2023     189 DOS: the patient was seen and examined on 05/03/2024   Brief hospital course: Jonah [place] 48 y/o male with past medical history of hypertension was found down at home agonal breathing. History of uncontrolled hypertension but never had been to the doctor. Patient was noted to have a large (4.1 cm) hemorrhage centered in the pons with intraventricular extension into the fourth ventricle and also extension into the basal cisterns and trace additional subarachnoid hemorrhage along the left parietal convexity. BP in ED was 262/156. CXR revealed RUL and perihilar infiltrates suggesting aspiration pneumonia. ED labs also revealed PH 7.169, LA 4.4, CPK 985. Patient was intubated in ED and was admitted hospital.   He had prolonged complicated hospital stay pontine hemorrhage, biparietal SAH.  Patient's mental status poor, s/p tracheostomy and PEG placement.  Sputum cultures positive for MRSA on contact precautions.  Patient had prolonged difficulty weaning from vent.  Patient is on trach collar for few days but later required ventilatory support mostly at nights and is currently in ICU.  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 No new issues.   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 Not a candidate for decannulation given ineffective airway clearance, Continue trach collar during day with vent support at night. On and off tachypnea persists. Continue trach care per protocol.   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  Reassessed by ophthalmology again on 04/18/2024, recommendations to continue  erythromycin  eye drops q2, tape eyes with paper tape if remain open without blinking. Given concern for worsening redness or opacification of left eye, I discussed with Dr. Pecen 05/02/24, plan to re-evaluate him.   Hypertension Continue amlodipine , Lopressor  and as needed hydralazine .  Blood pressure seems to be stable.  Nutrition Documentation    Flowsheet Row ED to Hosp-Admission (Current) from 10/27/2023 in [place] [facility]  Nutrition Problem Inadequate oral intake  Etiology inability to eat  Nutrition Goal Patient will meet greater than or equal to 90% of their needs  Interventions Tube feeding, Prostat, MVI, Juven   Palliative discussed with mother regarding goals of care. Nursing supportive care. Fall, aspiration precautions. Diet:  Diet Orders (From admission, onward)     Start     Ordered   04/09/24 0737  Diet NPO time specified  Diet effective now        04/09/24 0737           DVT prophylaxis: enoxaparin  (LOVENOX ) injection 40 mg Start: 03/06/24 1000 SCDs Start: 10/27/23 2226  Level of care: ICU   Code Status: Full Code  Subjective: Patient is seen and examined today morning. Low grade temp noted. Tolerating feeds well. He has foley which may need to be removed for reassessment.   Physical Exam: Vitals:   05/03/24 1000 05/03/24 1100 05/03/24 1141 05/03/24 1200  BP: 117/87 119/85  118/74  Pulse: 79 82  80  Resp: (!) 23 (!) 23  (!) 25  Temp:   98.5 F (36.9 C)   TempSrc:   Axillary   SpO2: 97% 97%  95%  Weight:      Height:        General - Middle aged African American  obese ill male, s/p trach, peg. HEENT - PERRLA, EOMI, left eye redness noted, non tender sinuses. Lung - Clear, basal rales, rhonchi, no wheezes, on trach collar. Heart - S1, S2 heard, no murmurs, rubs, trace pedal edema. Abdomen - Soft, non tender, peg tube intact, foley noted Neuro - poor mental status, unable to do full neuro exam. Skin - Warm and dry.  Data  Reviewed:      Latest Ref Rng & Units 04/28/2024    9:20 AM 04/21/2024    7:46 AM 04/16/2024    8:06 AM  CBC  WBC 4.0 - 10.5 K/uL 7.4  6.9    Hemoglobin 13.0 - 17.0 g/dL 88.4  89.3  88.3   Hematocrit 39.0 - 52.0 % 39.6  34.9  34.0   Platelets 150 - 400 K/uL 334  409        Latest Ref Rng & Units 05/03/2024    3:37 AM 04/26/2024    7:57 AM 04/24/2024    9:12 AM  BMP  Glucose 70 - 99 mg/dL 893  887  882   BUN 6 - 20 mg/dL 29  28  27    Creatinine 0.61 - 1.24 mg/dL 9.47  9.52  9.47   Sodium 135 - 145 mmol/L 144  141  144   Potassium 3.5 - 5.1 mmol/L 4.3  4.1  3.7   Chloride 98 - 111 mmol/L 103  103  104   CO2 22 - 32 mmol/L 28  29  33   Calcium 8.9 - 10.3 mg/dL 9.4  9.6  9.6    No results found.  Family Communication: no family at bedside.  Disposition: Status is: Inpatient Remains inpatient appropriate because: severity of illness  Planned Discharge Destination: LTAC     Time spent: 47 minutes  Author: Concepcion Riser, MD 05/03/2024 2:32 PM Secure chat 7am to 7pm For on call review [email or web address].

## 2024-05-04 DIAGNOSIS — Z930 Tracheostomy status: Secondary | ICD-10-CM | POA: Diagnosis not present

## 2024-05-04 DIAGNOSIS — I613 Nontraumatic intracerebral hemorrhage in brain stem: Secondary | ICD-10-CM | POA: Diagnosis not present

## 2024-05-04 DIAGNOSIS — J9611 Chronic respiratory failure with hypoxia: Secondary | ICD-10-CM | POA: Diagnosis not present

## 2024-05-04 DIAGNOSIS — J9601 Acute respiratory failure with hypoxia: Secondary | ICD-10-CM | POA: Diagnosis not present

## 2024-05-04 DIAGNOSIS — Z931 Gastrostomy status: Secondary | ICD-10-CM | POA: Diagnosis not present

## 2024-05-04 DIAGNOSIS — I619 Nontraumatic intracerebral hemorrhage, unspecified: Secondary | ICD-10-CM | POA: Diagnosis not present

## 2024-05-04 LAB — GLUCOSE, CAPILLARY
Glucose-Capillary: 132 mg/dL — ABNORMAL HIGH (ref 70–99)
Glucose-Capillary: 134 mg/dL — ABNORMAL HIGH (ref 70–99)
Glucose-Capillary: 167 mg/dL — ABNORMAL HIGH (ref 70–99)
Glucose-Capillary: 92 mg/dL (ref 70–99)
Glucose-Capillary: 99 mg/dL (ref 70–99)

## 2024-05-04 NOTE — Progress Notes (Signed)
 Progress Note   Patient: Garrett [name] [id number] DOB: May 02, 1976 DOA: 10/27/2023     190 DOS: the patient was seen and examined on 05/04/2024   Brief hospital course: Kanav Nand 48 y/o male with past medical history of hypertension was found down at home agonal breathing. History of uncontrolled hypertension but never had been to the doctor. Patient was noted to have a large (4.1 cm) hemorrhage centered in the pons with intraventricular extension into the fourth ventricle and also extension into the basal cisterns and trace additional subarachnoid hemorrhage along the left parietal convexity. BP in ED was 262/156. CXR revealed RUL and perihilar infiltrates suggesting aspiration pneumonia. ED labs also revealed PH 7.169, LA 4.4, CPK 985. Patient was intubated in ED and was admitted hospital.   He had prolonged complicated hospital stay pontine hemorrhage, biparietal SAH.  Patient's mental status poor, s/p tracheostomy and PEG placement.  Sputum cultures positive for MRSA on contact precautions.  Patient had prolonged difficulty weaning from vent.  Patient is on trach collar for few days but later required ventilatory support mostly at nights and is currently in ICU.  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 No new issues.   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 Not a candidate for decannulation given ineffective airway clearance, Continue trach collar during day with vent support at night. On and off tachypnea persists. Continue trach care per protocol.   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  Reassessed by ophthalmology again on 04/18/2024, recommendations to continue  erythromycin  eye drops q2, tape eyes with paper tape if remain open without blinking. Given concern for worsening redness or opacification of left eye, I discussed with Dr. Pecen 05/02/24, plan to re-evaluate him by ophthalmology.   Hypertension Continue amlodipine , Lopressor  and as needed hydralazine .  Blood pressure seems to be stable.  Nutrition Documentation    Flowsheet Row ED to Hosp-Admission (Current) from 10/27/2023 in [place] [facility]  Nutrition Problem Inadequate oral intake  Etiology inability to eat  Nutrition Goal Patient will meet greater than or equal to 90% of their needs  Interventions Tube feeding, Prostat, MVI, Juven   Palliative discussed with mother regarding goals of care. Nursing supportive care. Fall, aspiration precautions. Diet:  Diet Orders (From admission, onward)     Start     Ordered   04/09/24 0737  Diet NPO time specified  Diet effective now        04/09/24 0737           DVT prophylaxis: enoxaparin  (LOVENOX ) injection 40 mg Start: 03/06/24 1000 SCDs Start: 10/27/23 2226  Level of care: ICU   Code Status: Full Code  Subjective: Patient is seen and examined today morning. Tachycardia, tachypnea noted. Tolerating feeds well. He has foley which may need to be removed for reassessment.   Physical Exam: Vitals:   05/04/24 1004 05/04/24 1100 05/04/24 1140 05/04/24 1200  BP: (!) 144/106 (!) 126/92  (!) 122/93  Pulse: (!) 114 96  97  Resp:  (!) 33  (!) 28  Temp:   99.2 F (37.3 C)   TempSrc:   Axillary   SpO2:  95%  97%  Weight:      Height:        General -  Middle aged African American obese ill male, s/p trach, peg. HEENT - PERRLA, EOMI, left eye redness noted, non tender sinuses. Lung - Clear, basal rales, rhonchi, tachypnea, on trach collar. Heart - S1, S2 heard, tachycardia, trace pedal edema. Abdomen - Soft, non tender, peg tube intact, foley noted Neuro - poor mental status, unable to do full neuro exam. Skin - Warm  and dry.  Data Reviewed:      Latest Ref Rng & Units 04/28/2024    9:20 AM 04/21/2024    7:46 AM 04/16/2024    8:06 AM  CBC  WBC 4.0 - 10.5 K/uL 7.4  6.9    Hemoglobin 13.0 - 17.0 g/dL 88.4  89.3  88.3   Hematocrit 39.0 - 52.0 % 39.6  34.9  34.0   Platelets 150 - 400 K/uL 334  409        Latest Ref Rng & Units 05/03/2024    3:37 AM 04/26/2024    7:57 AM 04/24/2024    9:12 AM  BMP  Glucose 70 - 99 mg/dL 893  887  882   BUN 6 - 20 mg/dL 29  28  27    Creatinine 0.61 - 1.24 mg/dL 9.47  9.52  9.47   Sodium 135 - 145 mmol/L 144  141  144   Potassium 3.5 - 5.1 mmol/L 4.3  4.1  3.7   Chloride 98 - 111 mmol/L 103  103  104   CO2 22 - 32 mmol/L 28  29  33   Calcium 8.9 - 10.3 mg/dL 9.4  9.6  9.6    No results found.  Family Communication: no family at bedside.  Disposition: Status is: Inpatient Remains inpatient appropriate because: severity of illness  Planned Discharge Destination: LTAC     Time spent: 48 minutes  Author: Concepcion Riser, MD 05/04/2024 2:17 PM Secure chat 7am to 7pm For on call review [email or web address].

## 2024-05-04 NOTE — Progress Notes (Signed)
 Patient Name: Garrett [name]  DOB: 1975-10-10 MRN: [id number]   Reason for Consult: Follow-up of Exposure keratopathy   HPI: Briefly, this is a 48 y.o. 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 Left eye was noted to have increasing redness over the past few days and ophthalmology was consulted for re-evaluation.    Of note, pt is unable to voice concerns or changes in pain/vision.   Gtts: Erythromycin  ointment q2hrs  EXAM: Mental Status: Responds to stimulus   Base Exam: Right Eye Left Eye  Visual Acuity (At near) Unable Unable      Pupillary Exam Minimal reaction  Minimal reaction  Motility Roving eye movents Roving eye movments  Confrontation VF Unable Unable    Anterior Segment Exam      Lids/Lashes WNL WNL  Conjuctiva Mild injection, good ointment cover 2+ injection mostly inferior w/ good ointment cover, no discharge or conj breakdown noted  Cornea Mild inferior PEE Central  stromal scarring with central epithelial heaping but without epithelial defect, Clear view through  superior and inferior cornea with old appearing KNV inf to 3mm  Anterior Chamber Deep  Formed, no cell seen on limited bedside exam  Iris Round minimal reaction Round, minimal reaction  Lens Clear Clear (clear view superiorly)           Poster Segment Exam        Good Red reflex Good red reflex                                Assessment and Recommendation: 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 Increasing redness may be related to chronic conjunctival exposure but no clear infection noted today. Pt has been on erythromycin  for some time due to continued risk for epithelial breakdown and infection and could consider alternating with another antibiotic ointment (neomycin or similar) or  adding a second antibiotic such as Moxifloxacin TID to help prevent resistance. Would agree with continued taping when possible to help maintain the epithelial layer and prevent further scarring.

## 2024-05-04 NOTE — Plan of Care (Signed)
  Problem: Intracerebral Hemorrhage Tissue Perfusion: Goal: Complications of Intracerebral Hemorrhage will be minimized Outcome: Progressing   Problem: Nutrition: Goal: Risk of aspiration will decrease Outcome: Progressing

## 2024-05-05 DIAGNOSIS — I613 Nontraumatic intracerebral hemorrhage in brain stem: Secondary | ICD-10-CM | POA: Diagnosis not present

## 2024-05-05 DIAGNOSIS — J9611 Chronic respiratory failure with hypoxia: Secondary | ICD-10-CM | POA: Diagnosis not present

## 2024-05-05 DIAGNOSIS — J9601 Acute respiratory failure with hypoxia: Secondary | ICD-10-CM | POA: Diagnosis not present

## 2024-05-05 DIAGNOSIS — J9622 Acute and chronic respiratory failure with hypercapnia: Secondary | ICD-10-CM | POA: Diagnosis not present

## 2024-05-05 DIAGNOSIS — I619 Nontraumatic intracerebral hemorrhage, unspecified: Secondary | ICD-10-CM | POA: Diagnosis not present

## 2024-05-05 DIAGNOSIS — J9621 Acute and chronic respiratory failure with hypoxia: Secondary | ICD-10-CM | POA: Diagnosis not present

## 2024-05-05 DIAGNOSIS — Z931 Gastrostomy status: Secondary | ICD-10-CM | POA: Diagnosis not present

## 2024-05-05 DIAGNOSIS — Z930 Tracheostomy status: Secondary | ICD-10-CM | POA: Diagnosis not present

## 2024-05-05 LAB — CBC
HCT: 37.2 % — ABNORMAL LOW (ref 39.0–52.0)
Hemoglobin: 10.9 g/dL — ABNORMAL LOW (ref 13.0–17.0)
MCH: 27 pg (ref 26.0–34.0)
MCHC: 29.3 g/dL — ABNORMAL LOW (ref 30.0–36.0)
MCV: 92.3 fL (ref 80.0–100.0)
Platelets: 363 K/uL (ref 150–400)
RBC: 4.03 MIL/uL — ABNORMAL LOW (ref 4.22–5.81)
RDW: 16 % — ABNORMAL HIGH (ref 11.5–15.5)
WBC: 9.5 K/uL (ref 4.0–10.5)
nRBC: 0 % (ref 0.0–0.2)

## 2024-05-05 LAB — GLUCOSE, CAPILLARY
Glucose-Capillary: 103 mg/dL — ABNORMAL HIGH (ref 70–99)
Glucose-Capillary: 111 mg/dL — ABNORMAL HIGH (ref 70–99)
Glucose-Capillary: 143 mg/dL — ABNORMAL HIGH (ref 70–99)
Glucose-Capillary: 155 mg/dL — ABNORMAL HIGH (ref 70–99)

## 2024-05-05 LAB — POCT I-STAT 7, (LYTES, BLD GAS, ICA,H+H)
Acid-Base Excess: 9 mmol/L — ABNORMAL HIGH (ref 0.0–2.0)
Bicarbonate: 35.5 mmol/L — ABNORMAL HIGH (ref 20.0–28.0)
Calcium, Ion: 1.29 mmol/L (ref 1.15–1.40)
HCT: 33 % — ABNORMAL LOW (ref 39.0–52.0)
Hemoglobin: 11.2 g/dL — ABNORMAL LOW (ref 13.0–17.0)
O2 Saturation: 88 %
Patient temperature: 99.5
Potassium: 3.8 mmol/L (ref 3.5–5.1)
Sodium: 148 mmol/L — ABNORMAL HIGH (ref 135–145)
TCO2: 37 mmol/L — ABNORMAL HIGH (ref 22–32)
pCO2 arterial: 56.4 mmHg — ABNORMAL HIGH (ref 32–48)
pH, Arterial: 7.409 (ref 7.35–7.45)
pO2, Arterial: 57 mmHg — ABNORMAL LOW (ref 83–108)

## 2024-05-05 MED ORDER — GATIFLOXACIN 0.5 % OP SOLN
1.0000 [drp] | Freq: Four times a day (QID) | OPHTHALMIC | Status: DC
Start: 2024-05-05 — End: 2024-05-15
  Administered 2024-05-05 – 2024-05-14 (×39): 1 [drp] via OPHTHALMIC
  Filled 2024-05-05: qty 2.5

## 2024-05-05 NOTE — Plan of Care (Signed)
  Problem: Intracerebral Hemorrhage Tissue Perfusion: Goal: Complications of Intracerebral Hemorrhage will be minimized Outcome: Progressing   Problem: Nutrition: Goal: Risk of aspiration will decrease Outcome: Progressing   Problem: Nutrition: Goal: Dietary intake will improve Outcome: Progressing

## 2024-05-05 NOTE — Progress Notes (Signed)
 Progress Note   Patient: Garrett [name] FMW:[id number] DOB: 05/18/1976 DOA: 10/27/2023     191 DOS: the patient was seen and examined on 05/05/2024   Brief hospital course: Erlin Hassing 48 y/o male with past medical history of hypertension was found down at home agonal breathing. History of uncontrolled hypertension but never had been to the doctor. Patient was noted to have a large (4.1 cm) hemorrhage centered in the pons with intraventricular extension into the fourth ventricle and also extension into the basal cisterns and trace additional subarachnoid hemorrhage along the left parietal convexity. BP in ED was 262/156. CXR revealed RUL and perihilar infiltrates suggesting aspiration pneumonia. ED labs also revealed PH 7.169, LA 4.4, CPK 985. Patient was intubated in ED and was admitted hospital.   He had prolonged complicated hospital stay pontine hemorrhage, biparietal SAH.  Patient's mental status poor, s/p tracheostomy and PEG placement.  Sputum cultures positive for MRSA on contact precautions.  Patient had prolonged difficulty weaning from vent.  Patient is on trach collar for few days but later required ventilatory support mostly at nights and is currently in ICU.  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 No new issues.   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 Not a candidate for decannulation given ineffective airway clearance, Continue trach collar during day with vent support at night. On and off tachypnea persists. Continue trach care per protocol. Pulmonary follow up appreciated. Follow recommendations.   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  Reassessed by ophthalmology  again on 04/18/2024, recommendations to continue erythromycin  eye drops q2, tape eyes with paper tape if remain open without blinking. Given concern for worsening redness or opacification of left eye, seen by Dr. Leane 05/04/24, no epithelial defects noted. Will start 2nd antibiotic Gatifloxacin  QID.   Hypertension Continue amlodipine , Lopressor  and as needed hydralazine .  Blood pressure seems to be stable.  Nutrition Documentation    Flowsheet Row ED to Hosp-Admission (Current) from 10/27/2023 in [facility]  Nutrition Problem Inadequate oral intake  Etiology inability to eat  Nutrition Goal Patient will meet greater than or equal to 90% of their needs  Interventions Tube feeding, Prostat, MVI, Juven   Palliative discussed with mother regarding goals of care. Nursing supportive care. Fall, aspiration precautions. Diet:  Diet Orders (From admission, onward)     Start     Ordered   04/09/24 0737  Diet NPO time specified  Diet effective now        04/09/24 0737           DVT prophylaxis: enoxaparin  (LOVENOX ) injection 40 mg Start: 03/06/24 1000 SCDs Start: 10/27/23 2226  Level of care: ICU   Code Status: Full Code  Subjective: Patient is seen and examined today morning. Tachycardia, tachypnea, low grade temp noted. Tolerating feeds well.  Physical Exam: Vitals:   05/05/24 1019 05/05/24 1020 05/05/24 1111 05/05/24 1204  BP: 125/89 125/89    Pulse: (!) 110  (!) 101   Resp:   (!) 34   Temp:    (!) 100.6 F (38.1 C)  TempSrc:    Axillary  SpO2:   100%   Weight:      Height:        General - Middle aged  African American obese ill male, s/p trach, peg. HEENT - PERRLA, EOMI, left eye redness noted, non tender sinuses. Lung - Clear, basal rales, rhonchi, tachypnea, on trach collar. Heart - S1, S2 heard, tachycardia, trace pedal edema. Abdomen - Soft, non tender, peg tube intact, foley noted Neuro - poor mental status, unable to do full neuro  exam. Skin - Warm and dry.  Data Reviewed:      Latest Ref Rng & Units 05/05/2024   10:10 AM 05/05/2024    9:08 AM 04/28/2024    9:20 AM  CBC  WBC 4.0 - 10.5 K/uL 9.5   7.4   Hemoglobin 13.0 - 17.0 g/dL 89.0  88.7  88.4   Hematocrit 39.0 - 52.0 % 37.2  33.0  39.6   Platelets 150 - 400 K/uL 363   334       Latest Ref Rng & Units 05/05/2024    9:08 AM 05/03/2024    3:37 AM 04/26/2024    7:57 AM  BMP  Glucose 70 - 99 mg/dL  893  887   BUN 6 - 20 mg/dL  29  28   Creatinine 9.38 - 1.24 mg/dL  9.47  9.52   Sodium 864 - 145 mmol/L 148  144  141   Potassium 3.5 - 5.1 mmol/L 3.8  4.3  4.1   Chloride 98 - 111 mmol/L  103  103   CO2 22 - 32 mmol/L  28  29   Calcium 8.9 - 10.3 mg/dL  9.4  9.6    No results found.  Family Communication: no family at bedside.  Disposition: Status is: Inpatient Remains inpatient appropriate because: severity of illness  Planned Discharge Destination: LTAC     Time spent: 47 minutes  Author: Concepcion Riser, MD 05/05/2024 12:22 PM Secure chat 7am to 7pm For on call review [email or web address].

## 2024-05-05 NOTE — Progress Notes (Signed)
 NAME:  Garrett [name], MRN:  [id number], DOB:  Oct 18, 1975, LOS: 191 ADMISSION DATE:  10/27/2023, CONSULTATION DATE:  10/27/2023 REFERRING MD: Regalado - TRH, CHIEF COMPLAINT:  Found down   History of Present Illness:  48 y/o man who was found down at home. PMHx significant for HTN.  Patient was down for an unknown amount of time, found by his fiance when she came home from work, reportedly with agonal breathing. Patient had driven her to work the morning of admission.  He has HTN but never checks his BP and does not follow with MD. Patient's fiance states that she can tell when his BP is really high; his eyes get blood shot and he is sweating. No h/o seizures. Besides almost daily marijuana and cigarette smoking, she is not aware of any other drug use. Drug screen positive for opioids and he was given several doses of Narcan . 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 BP in ED 262/156. CXR revealed RUL and perihilar infiltrates suggesting aspiration pneumonia. ED labs also revealed PH 7.169, LA 4.4, CPK 985. Patient was intubated in ED.  PCCM consulted.  Pertinent Medical History:  Hypertension  Significant Hospital Events: Including procedures, antibiotic start and stop dates in addition to other pertinent events   3/08 - Intubated in ED, pontine bleed/SAH. CT head 17:09 Acute large hemorrhage 4.1 cm in pons with intraventricular extension without hydrocephalus 3/09 - CTA 03:14 AM No change in IPH in pons, similar biparietal [facility] 3/14 - Now awake, appears locked in can follow commands with eyes. 3/16 - Purposeful with left upper extremity  3/22 - Tracheostomy placed at bedside, bleeding issues overnight from trach site 3/24 - PEG (Dr. Sebastian) 3/24 - Sputum culture with MRSA 3/27  - Vomited. TF on hold. Abd film c/w ileus. Added reglan , got SSE. Did tolerate PSV all day  3/28 - BMs x2 after SSE day prior. Added back TFs at 1/2 rated. Tolerated PS 3/29 - Tolerated PS  3/31 - Tolerating CPAP PS 15/5, TF on hold due to ileus. 4/01 - Con't to hold tube feeds today, restarting vancomycin  and sending tracheal aspirate as peaks are 37, plat 24, driving 19. Thick secretions. Fever overnight.  4/02 - Peaks improved. Ongoing hiccups. Trickle feeding. Neuro exam unchanged.  4/20 - Trach was changed to cuffless #8 4/28 - Not safe to swallow. Limited communication and he follows simple commands. On TF 5/01 - On TC 28%, with large secretions. Working with speech and he is improving to initiate PMV trials under ST supervision   5/05 - On TC 30%, 7 L/min. Trach #6 cuffless, changed 5/1 to facilitate PMV trials  5/12 - On TC 21%, 6 L/min. Trach #6 cuffless, unable to produce sounds or speak on PMV. No significant resp secretions. On PEG TF 6/10 - Tracheostomy was removed due to failure to well provide adequate airway 02/15/2024 pulmonary critical care consult. 7/07 Patient obtunded and desaturating on NRB; transfer to icu for intubation; updated mother over phone who wants to reverse code status to full code; ent consulted 7/09 Remains intubated on no continuous sedation, right eye open but does not track movement  7/16 Revision ENT trach with [facility] Flap 7/18 Weaned on vent 5 hours  7/19 Tolerated trach collar for the majority of the day but had some increased work of breath  resulting in being placed back on vent support  7/20 No issues overnight, back on ATC this AM 7/21 Did not tolerate ATC. Placed back on PRVC. 7/24 ATC until afternoon and went on vent for WOB reportedly  7/25 ATC for a few hours  7/26 ATC x 6h 7/28 tolerating trach collar-tolerated for most of the day 7/30 on vent overnight and back on trach collar today 8/1 back on vent overnight  8/2 was able to maintain off of  ventilator overnight but by 6 AM today patient was seen with signs of increased work of breathing including tachycardia therefore patient was placed back on ventilator 8/3 remained on ventilator overnight to transition to trach collar this a.m. 8/4 did 18 hour ATC yesterday; back on vent 3am today 8/5 TC for 12-14 hours. Then went back to vent because of tachypnea.  8/6 tolerated ATC at 30% for 24 hours. Back on vent this AM for rest as was a bit tachypneic 8/11 been off vent >24 hours. 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  8/12-off vent for over 48 hours 8/18 completed abx  8/19 Changed to cuffless trach 8/20 Increasing tachypnea, hypercarbia.  Trach changed back to #6 cuffed and brought back to ICU to be placed on ventilator.  Antibiotics broadened to linezolid  and cefepime  8/23 PSV wean 8/24 eeg for twitching no sz 8/26 started TC 8/27 did TC overnight, hypercarbic on repeat gas but compensated 9/2 periods of apnea but overall doing ok ATC day vent night 9/8 continuing noct vent Atc day   Interim History / Subjective:  No events overnight Tolerated trach collar yesterday Currently on trach collar  Objective:   Blood pressure 107/82, pulse 86, temperature 99.3 F (37.4 C), temperature source Axillary, resp. rate 12, height 5' 8 (1.727 m), weight 94 kg, SpO2 100%.    Vent Mode: PRVC FiO2 (%):  [28 %-40 %] 40 % Set Rate:  [12 bmp] 12 bmp Vt Set:  [550 mL] 550 mL PEEP:  [5 cmH20] 5 cmH20 Plateau Pressure:  [16 cmH20-18 cmH20] 16 cmH20   Intake/Output Summary (Last 24 hours) at 05/05/2024 0745 Last data filed at 05/05/2024 0600 Gross per 24 hour  Intake 750 ml  Output 1210 ml  Net -460 ml   Filed Weights   05/03/24 0500 05/04/24 0500 05/05/24 0420  Weight: 92 kg 92 kg 94 kg   Physical Examination: General: Chronically ill middle aged M  HEENT: [place]. Trach secure. Neuro: not awake, not following commands CV: rrr  PULM:  poor air movement, shallow breaths, no wheezing GI: soft, BS + Extremities: some slight hand contractures    Resolved Problem List:  Post sedation hypotension Left eye conjunctivitis - resolved  Hyperglycemia, resolved Pseudomonas and E. coli tracheitis Hypernatremia  Assessment and Plan:   AoC resp failure w hypercarbia and hypoxia Trach dependence  -s/pt tracheostomy redo 7/16, initial tracheostomy placed back in May, had been downsized to a size 4, and patient could not maintain airway clearance. Not a candidate for decannulation given ineffective airway clearance -course since then has been c/b recurrent HCAP, variable vent need-- at times having decompensations req 24hr vent.  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 Please call Elink 7p-7a. [phone]

## 2024-05-06 DIAGNOSIS — Z931 Gastrostomy status: Secondary | ICD-10-CM | POA: Diagnosis not present

## 2024-05-06 DIAGNOSIS — I613 Nontraumatic intracerebral hemorrhage in brain stem: Secondary | ICD-10-CM | POA: Diagnosis not present

## 2024-05-06 DIAGNOSIS — J9601 Acute respiratory failure with hypoxia: Secondary | ICD-10-CM | POA: Diagnosis not present

## 2024-05-06 DIAGNOSIS — I619 Nontraumatic intracerebral hemorrhage, unspecified: Secondary | ICD-10-CM | POA: Diagnosis not present

## 2024-05-06 DIAGNOSIS — Z930 Tracheostomy status: Secondary | ICD-10-CM | POA: Diagnosis not present

## 2024-05-06 DIAGNOSIS — J9611 Chronic respiratory failure with hypoxia: Secondary | ICD-10-CM | POA: Diagnosis not present

## 2024-05-06 LAB — GLUCOSE, CAPILLARY
Glucose-Capillary: 129 mg/dL — ABNORMAL HIGH (ref 70–99)
Glucose-Capillary: 111 mg/dL — ABNORMAL HIGH (ref 70–99)
Glucose-Capillary: 90 mg/dL (ref 70–99)
Glucose-Capillary: 125 mg/dL — ABNORMAL HIGH (ref 70–99)

## 2024-05-06 NOTE — Progress Notes (Addendum)
 Progress Note   Patient: Garrett [name] FMW:[id number] DOB: May 19, 1976 DOA: 10/27/2023     192 DOS: the patient was seen and examined on 05/06/2024   Brief hospital course: Garrett [name] 48 y/o male with past medical history of hypertension was found down at home agonal breathing. History of uncontrolled hypertension but never had been to the doctor. Patient was noted to have a large (4.1 cm) hemorrhage centered in the pons with intraventricular extension into the fourth ventricle and also extension into the basal cisterns and trace additional subarachnoid hemorrhage along the left parietal convexity. BP in ED was 262/156. CXR revealed RUL and perihilar infiltrates suggesting aspiration pneumonia. ED labs also revealed PH 7.169, LA 4.4, CPK 985. Patient was intubated in ED and was admitted hospital.   He had prolonged complicated hospital stay pontine hemorrhage, biparietal SAH.  Patient's mental status poor, s/p tracheostomy and PEG placement.  Sputum cultures positive for MRSA on contact precautions.  Patient had prolonged difficulty weaning from vent.  Patient is on trach collar for few days but later required ventilatory support mostly at nights and is currently in ICU.  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  On and off tachycardia, tachypnea noted. Mental status poor.   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 Not a candidate for decannulation given ineffective airway clearance, Continue trach collar during day with vent support at night. On and off tachypnea persists. Continue trach care per protocol. Pulmonary follow up appreciated. Follow recommendations.   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 Waylan evaluation  8/11, placed tarsorraphy.  Reassessed by ophthalmology again on 04/18/2024, recommendations to continue erythromycin  eye drops q2, tape eyes with paper tape if remain open without blinking. Given concern for worsening redness or opacification of left eye, seen by Dr. Leane 05/04/24, no epithelial defects noted. Continue 2nd antibiotic Gatifloxacin  QID.   Hypertension Continue amlodipine , Lopressor  and as needed hydralazine .  Blood pressure seems to be stable.  Nutrition Documentation    Flowsheet Row ED to Hosp-Admission (Current) from 10/27/2023 in [place] [facility]  Nutrition Problem Inadequate oral intake  Etiology inability to eat  Nutrition Goal Patient will meet greater than or equal to 90% of their needs  Interventions Tube feeding, Prostat, MVI, Juven   Palliative discussed with mother regarding goals of care. Nursing supportive care. Fall, aspiration precautions. Diet:  Diet Orders (From admission, onward)     Start     Ordered   04/09/24 0737  Diet NPO time specified  Diet effective now        04/09/24 0737           DVT prophylaxis: enoxaparin  (LOVENOX ) injection 40 mg Start: 03/06/24 1000 SCDs Start: 10/27/23 2226  Level of care: ICU   Code Status: Full Code  Subjective: Patient is seen and examined today morning. Open eyes, tachycardia, tachypnea, noted. Tolerating feeds well.  Physical Exam: Vitals:   05/06/24 1100 05/06/24 1125 05/06/24 1128 05/06/24 1534  BP: 132/83     Pulse: (!) 113 (!) 115 (!) 115 90  Resp:  (!) 25 (!) 22 (!) 27  Temp:  99.7 F (37.6 C)    TempSrc:  Oral    SpO2:  93% 95% 95%  Weight:      Height:  General - Middle aged African American obese ill male, s/p trach, peg. HEENT - PERRLA, EOMI, left eye redness noted, non tender sinuses. Lung - Clear, basal rales, rhonchi, tachypnea, on trach collar. Heart - S1, S2 heard, tachycardia, trace pedal edema. Abdomen - Soft, non tender, peg tube intact, foley  noted Neuro - poor mental status, unable to do full neuro exam. Skin - Warm and dry.  Data Reviewed:      Latest Ref Rng & Units 05/05/2024   10:10 AM 05/05/2024    9:08 AM 04/28/2024    9:20 AM  CBC  WBC 4.0 - 10.5 K/uL 9.5   7.4   Hemoglobin 13.0 - 17.0 g/dL 89.0  88.7  88.4   Hematocrit 39.0 - 52.0 % 37.2  33.0  39.6   Platelets 150 - 400 K/uL 363   334       Latest Ref Rng & Units 05/05/2024    9:08 AM 05/03/2024    3:37 AM 04/26/2024    7:57 AM  BMP  Glucose 70 - 99 mg/dL  893  887   BUN 6 - 20 mg/dL  29  28   Creatinine 9.38 - 1.24 mg/dL  9.47  9.52   Sodium 864 - 145 mmol/L 148  144  141   Potassium 3.5 - 5.1 mmol/L 3.8  4.3  4.1   Chloride 98 - 111 mmol/L  103  103   CO2 22 - 32 mmol/L  28  29   Calcium 8.9 - 10.3 mg/dL  9.4  9.6    No results found.  Family Communication: no family at bedside.  Disposition: Status is: Inpatient Remains inpatient appropriate because: severity of illness  Planned Discharge Destination: LTAC     Time spent: 46 minutes  Author: Concepcion Riser, MD 05/06/2024 4:01 PM Secure chat 7am to 7pm For on call review [email or web address].

## 2024-05-06 NOTE — Plan of Care (Signed)
  Problem: Intracerebral Hemorrhage Tissue Perfusion: Goal: Complications of Intracerebral Hemorrhage will be minimized Outcome: Progressing   Problem: Nutrition: Goal: Risk of aspiration will decrease Outcome: Progressing   Problem: Intracerebral Hemorrhage Tissue Perfusion: Goal: Complications of Intracerebral Hemorrhage will be minimized Outcome: Progressing

## 2024-05-06 NOTE — Plan of Care (Signed)
  Problem: Intracerebral Hemorrhage Tissue Perfusion: Goal: Complications of Intracerebral Hemorrhage will be minimized 05/06/2024 0247 by Loria Zachary CROME, RN Outcome: Progressing 05/06/2024 0247 by Loria Zachary CROME, RN Outcome: Progressing   Problem: Nutrition: Goal: Risk of aspiration will decrease Outcome: Progressing   Problem: Intracerebral Hemorrhage Tissue Perfusion: Goal: Complications of Intracerebral Hemorrhage will be minimized Outcome: Progressing

## 2024-05-07 DIAGNOSIS — J9601 Acute respiratory failure with hypoxia: Secondary | ICD-10-CM | POA: Diagnosis not present

## 2024-05-07 DIAGNOSIS — I619 Nontraumatic intracerebral hemorrhage, unspecified: Secondary | ICD-10-CM | POA: Diagnosis not present

## 2024-05-07 DIAGNOSIS — Z930 Tracheostomy status: Secondary | ICD-10-CM | POA: Diagnosis not present

## 2024-05-07 DIAGNOSIS — Z931 Gastrostomy status: Secondary | ICD-10-CM | POA: Diagnosis not present

## 2024-05-07 DIAGNOSIS — J9611 Chronic respiratory failure with hypoxia: Secondary | ICD-10-CM | POA: Diagnosis not present

## 2024-05-07 DIAGNOSIS — I613 Nontraumatic intracerebral hemorrhage in brain stem: Secondary | ICD-10-CM | POA: Diagnosis not present

## 2024-05-07 LAB — GLUCOSE, CAPILLARY
Glucose-Capillary: 103 mg/dL — ABNORMAL HIGH (ref 70–99)
Glucose-Capillary: 140 mg/dL — ABNORMAL HIGH (ref 70–99)
Glucose-Capillary: 146 mg/dL — ABNORMAL HIGH (ref 70–99)
Glucose-Capillary: 145 mg/dL — ABNORMAL HIGH (ref 70–99)

## 2024-05-07 NOTE — Progress Notes (Signed)
 Progress Note   Patient: Garrett [name] FMW:[id number] DOB: 1975/11/06 DOA: 10/27/2023     193 DOS: the patient was seen and examined on 05/07/2024   Brief hospital course: Donney Cerasoli 48 y/o male with past medical history of hypertension was found down at home agonal breathing. History of uncontrolled hypertension but never had been to the doctor. Patient was noted to have a large (4.1 cm) hemorrhage centered in the pons with intraventricular extension into the fourth ventricle and also extension into the basal cisterns and trace additional subarachnoid hemorrhage along the left parietal convexity. BP in ED was 262/156. CXR revealed RUL and perihilar infiltrates suggesting aspiration pneumonia. ED labs also revealed PH 7.169, LA 4.4, CPK 985. Patient was intubated in ED and was admitted hospital.   He had prolonged complicated hospital stay pontine hemorrhage, biparietal SAH.  Patient's mental status poor, s/p tracheostomy and PEG placement.  Sputum cultures positive for MRSA on contact precautions.  Patient had prolonged difficulty weaning from vent.  Patient is on trach collar for few days but later required ventilatory support mostly at nights and is currently in ICU.  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  On and off tachycardia, tachypnea noted. Mental status continue to be poor.   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 Not a candidate for decannulation given ineffective airway clearance, Continue trach collar during day with vent support at night. On and off tachypnea persists. Continue trach care per protocol. Pulmonary follow up appreciated. Follow recommendations.   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 Waylan  evaluation 8/11, placed tarsorraphy.  Reassessed by ophthalmology again on 04/18/2024, recommendations to continue erythromycin  eye drops q2, tape eyes with paper tape if remain open without blinking. Given concern for worsening redness or opacification of left eye, seen by Dr. Leane 05/04/24, no epithelial defects noted. Continue 2nd antibiotic Gatifloxacin  QID.   Hypertension Continue amlodipine , Lopressor  and as needed hydralazine .  Blood pressure seems to be stable.  Nutrition Documentation    Flowsheet Row ED to Hosp-Admission (Current) from 10/27/2023 in [place] [facility]  Nutrition Problem Inadequate oral intake  Etiology inability to eat  Nutrition Goal Patient will meet greater than or equal to 90% of their needs  Interventions Tube feeding, Prostat, MVI, Juven   Palliative discussed with mother regarding goals of care. Nursing supportive care. Fall, aspiration precautions. Diet:  Diet Orders (From admission, onward)     Start     Ordered   04/09/24 0737  Diet NPO time specified  Diet effective now        04/09/24 0737           DVT prophylaxis: enoxaparin  (LOVENOX ) injection 40 mg Start: 03/06/24 1000 SCDs Start: 10/27/23 2226  Level of care: ICU   Code Status: Full Code  Subjective: Patient is seen and examined today morning. sleeping, yellow MEWS due to tachycardia, tachypnea, noted. Tolerating feeds well.  Physical Exam: Vitals:   05/07/24 0929 05/07/24 1000 05/07/24 1200 05/07/24 1542  BP: (!) 116/91 117/88    Pulse: 87 88    Resp: 12 12    Temp:   99.9 F (37.7 C) (!) 100.4 F (38 C)  TempSrc:   Oral Oral  SpO2: 100% 100%    Weight:  Height:        General - Middle aged African American obese ill male, s/p trach, peg. HEENT - PERRLA, EOMI, left eye redness noted, non tender sinuses. Lung - Clear, basal rales, rhonchi, tachypnea, on trach collar. Heart - S1, S2 heard, tachycardia, trace pedal edema. Abdomen - Soft, non tender, peg  tube intact, foley noted Neuro - poor mental status, unable to do full neuro exam. Skin - Warm and dry.  Data Reviewed:      Latest Ref Rng & Units 05/05/2024   10:10 AM 05/05/2024    9:08 AM 04/28/2024    9:20 AM  CBC  WBC 4.0 - 10.5 K/uL 9.5   7.4   Hemoglobin 13.0 - 17.0 g/dL 89.0  88.7  88.4   Hematocrit 39.0 - 52.0 % 37.2  33.0  39.6   Platelets 150 - 400 K/uL 363   334       Latest Ref Rng & Units 05/05/2024    9:08 AM 05/03/2024    3:37 AM 04/26/2024    7:57 AM  BMP  Glucose 70 - 99 mg/dL  893  887   BUN 6 - 20 mg/dL  29  28   Creatinine 9.38 - 1.24 mg/dL  9.47  9.52   Sodium 864 - 145 mmol/L 148  144  141   Potassium 3.5 - 5.1 mmol/L 3.8  4.3  4.1   Chloride 98 - 111 mmol/L  103  103   CO2 22 - 32 mmol/L  28  29   Calcium 8.9 - 10.3 mg/dL  9.4  9.6    No results found.  Family Communication: no family at bedside.  Disposition: Status is: Inpatient Remains inpatient appropriate because: severity of illness  Planned Discharge Destination: LTAC     Time spent: 46 minutes  Author: Concepcion Riser, MD 05/07/2024 3:54 PM Secure chat 7am to 7pm For on call review [email or web address].

## 2024-05-07 NOTE — Plan of Care (Signed)
  Problem: Intracerebral Hemorrhage Tissue Perfusion: Goal: Complications of Intracerebral Hemorrhage will be minimized Outcome: Progressing   Problem: Nutrition: Goal: Risk of aspiration will decrease 05/07/2024 0234 by Loria Zachary CROME, RN Outcome: Progressing 05/07/2024 0233 by Loria Zachary CROME, RN Outcome: Progressing   Problem: Education: Goal: Knowledge of disease or condition will improve Outcome: Progressing

## 2024-05-07 NOTE — Plan of Care (Signed)
  Problem: Intracerebral Hemorrhage Tissue Perfusion: Goal: Complications of Intracerebral Hemorrhage will be minimized Outcome: Progressing   Problem: Nutrition: Goal: Risk of aspiration will decrease Outcome: Progressing

## 2024-05-07 NOTE — TOC Progression Note (Addendum)
 Transition of Care [facility]) - Progression Note    Patient Details  Name: Faisal Stradling MRN: [id number] Date of Birth: 11-12-1975  Transition of Care [facility]) CM/SW Contact  Lendia Dais, [place] Phone Number: 05/07/2024, 1:28 PM  Clinical Narrative:  Barriers remain in place d/t vent being in place. Leadership involved.   CSW spoke to [place] and mentioned that she has not received any updates from [employer] about his disability application and that it can be up to a few more weeks for any update.    CSW will continue to follow.     Expected Discharge Plan: Long Term Nursing Home Barriers to Discharge: Continued Medical Work up, SNF Pending Medicaid, SNF Pending bed offer, SNF Pending payor source - LOG, Inadequate or no insurance (New trach)               Expected Discharge Plan and Services In-house Referral: Clinical Social Work, Hospice / Palliative Care   Post Acute Care Choice: Skilled Nursing Facility Living arrangements for the past 2 months: Single Family Home                                       Social Drivers of Health (SDOH) Interventions SDOH Screenings   Food Insecurity: Patient Unable To Answer (10/29/2023)  Social Connections: Unknown (06/23/2023)   Received from [facility]  Tobacco Use: High Risk (10/31/2023)    Readmission Risk Interventions     No data to display

## 2024-05-08 DIAGNOSIS — J9601 Acute respiratory failure with hypoxia: Secondary | ICD-10-CM | POA: Diagnosis not present

## 2024-05-08 DIAGNOSIS — Z930 Tracheostomy status: Secondary | ICD-10-CM | POA: Diagnosis not present

## 2024-05-08 DIAGNOSIS — I619 Nontraumatic intracerebral hemorrhage, unspecified: Secondary | ICD-10-CM | POA: Diagnosis not present

## 2024-05-08 DIAGNOSIS — Z789 Other specified health status: Secondary | ICD-10-CM | POA: Diagnosis not present

## 2024-05-08 DIAGNOSIS — L8990 Pressure ulcer of unspecified site, unspecified stage: Secondary | ICD-10-CM | POA: Diagnosis not present

## 2024-05-08 DIAGNOSIS — J9611 Chronic respiratory failure with hypoxia: Secondary | ICD-10-CM | POA: Diagnosis not present

## 2024-05-08 LAB — GLUCOSE, CAPILLARY
Glucose-Capillary: 105 mg/dL — ABNORMAL HIGH (ref 70–99)
Glucose-Capillary: 106 mg/dL — ABNORMAL HIGH (ref 70–99)
Glucose-Capillary: 108 mg/dL — ABNORMAL HIGH (ref 70–99)
Glucose-Capillary: 128 mg/dL — ABNORMAL HIGH (ref 70–99)

## 2024-05-08 NOTE — Plan of Care (Signed)
  Problem: Pain Managment: Goal: General experience of comfort will improve and/or be controlled Outcome: Progressing   Problem: Education: Goal: Knowledge of disease or condition will improve Outcome: Progressing   Problem: Nutrition: Goal: Risk of aspiration will decrease Outcome: Progressing

## 2024-05-08 NOTE — Progress Notes (Signed)
 NAME:  Garrett [name], MRN:  [id number], DOB:  July 09, 1976, LOS: 194 ADMISSION DATE:  10/27/2023, CONSULTATION DATE:  10/27/2023 REFERRING MD: Regalado - TRH, CHIEF COMPLAINT:  Found down   History of Present Illness:  48 y/o man who was found down at home. PMHx significant for HTN.  Patient was down for an unknown amount of time, found by his fiance when she came home from work, reportedly with agonal breathing. Patient had driven her to work the morning of admission.  He has HTN but never checks his BP and does not follow with MD. Patient's fiance states that she can tell when his BP is really high; his eyes get blood shot and he is sweating. No h/o seizures. Besides almost daily marijuana and cigarette smoking, she is not aware of any other drug use. Drug screen positive for opioids and he was given several doses of Narcan . 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 BP in ED 262/156. CXR revealed RUL and perihilar infiltrates suggesting aspiration pneumonia. ED labs also revealed PH 7.169, LA 4.4, CPK 985. Patient was intubated in ED.  PCCM consulted.  Pertinent Medical History:  Hypertension  Significant Hospital Events: Including procedures, antibiotic start and stop dates in addition to other pertinent events   3/08 - Intubated in ED, pontine bleed/SAH. CT head 17:09 Acute large hemorrhage 4.1 cm in pons with intraventricular extension without hydrocephalus 3/09 - CTA 03:14 AM No change in IPH in pons, similar biparietal [facility] 3/14 - Now awake, appears locked in can follow commands with eyes. 3/16 - Purposeful with left upper extremity  3/22 - Tracheostomy placed at bedside, bleeding issues overnight from trach site 3/24 - PEG (Dr. Sebastian) 3/24 - Sputum culture with MRSA 3/27  - Vomited. TF on hold. Abd film c/w ileus. Added reglan , got SSE. Did tolerate PSV all day  3/28 - BMs x2 after SSE day prior. Added back TFs at 1/2 rated. Tolerated PS 3/29 - Tolerated PS  3/31 - Tolerating CPAP PS 15/5, TF on hold due to ileus. 4/01 - Con't to hold tube feeds today, restarting vancomycin  and sending tracheal aspirate as peaks are 37, plat 24, driving 19. Thick secretions. Fever overnight.  4/02 - Peaks improved. Ongoing hiccups. Trickle feeding. Neuro exam unchanged.  4/20 - Trach was changed to cuffless #8 4/28 - Not safe to swallow. Limited communication and he follows simple commands. On TF 5/01 - On TC 28%, with large secretions. Working with speech and he is improving to initiate PMV trials under ST supervision   5/05 - On TC 30%, 7 L/min. Trach #6 cuffless, changed 5/1 to facilitate PMV trials  5/12 - On TC 21%, 6 L/min. Trach #6 cuffless, unable to produce sounds or speak on PMV. No significant resp secretions. On PEG TF 6/10 - Tracheostomy was removed due to failure to well provide adequate airway 02/15/2024 pulmonary critical care consult. 7/07 Patient obtunded and desaturating on NRB; transfer to icu for intubation; updated mother over phone who wants to reverse code status to full code; ent consulted 7/09 Remains intubated on no continuous sedation, right eye open but does not track movement  7/16 Revision ENT trach with [facility] Flap 7/18 Weaned on vent 5 hours  7/19 Tolerated trach collar for the majority of the day but had some increased work of breath  resulting in being placed back on vent support  7/20 No issues overnight, back on ATC this AM 7/21 Did not tolerate ATC. Placed back on PRVC. 7/24 ATC until afternoon and went on vent for WOB reportedly  7/25 ATC for a few hours  7/26 ATC x 6h 7/28 tolerating trach collar-tolerated for most of the day 7/30 on vent overnight and back on trach collar today 8/1 back on vent overnight  8/2 was able to maintain off of  ventilator overnight but by 6 AM today patient was seen with signs of increased work of breathing including tachycardia therefore patient was placed back on ventilator 8/3 remained on ventilator overnight to transition to trach collar this a.m. 8/4 did 18 hour ATC yesterday; back on vent 3am today 8/5 TC for 12-14 hours. Then went back to vent because of tachypnea.  8/6 tolerated ATC at 30% for 24 hours. Back on vent this AM for rest as was a bit tachypneic 8/11 been off vent >24 hours. 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  8/12-off vent for over 48 hours 8/18 completed abx  8/19 Changed to cuffless trach 8/20 Increasing tachypnea, hypercarbia.  Trach changed back to #6 cuffed and brought back to ICU to be placed on ventilator.  Antibiotics broadened to linezolid  and cefepime  8/23 PSV wean 8/24 eeg for twitching no sz 8/26 started TC 8/27 did TC overnight, hypercarbic on repeat gas but compensated 9/2 periods of apnea but overall doing ok ATC day vent night 9/8 continuing noct vent Atc day   Interim History / Subjective:  No events overnight Tolerated trach collar yesterday Currently on trach collar  Objective:   Blood pressure (!) 121/91, pulse 82, temperature 98.4 F (36.9 C), temperature source Axillary, resp. rate 12, height 5' 8 (1.727 m), weight 92 kg, SpO2 100%.    Vent Mode: PRVC FiO2 (%):  [40 %] 40 % Set Rate:  [12 bmp] 12 bmp Vt Set:  [550 mL] 550 mL PEEP:  [5 cmH20] 5 cmH20 Plateau Pressure:  [16 cmH20-17 cmH20] 17 cmH20   Intake/Output Summary (Last 24 hours) at 05/08/2024 0759 Last data filed at 05/08/2024 0600 Gross per 24 hour  Intake 1111 ml  Output 835 ml  Net 276 ml   Filed Weights   05/06/24 0349 05/07/24 0427 05/08/24 0435  Weight: 95 kg 95 kg 92 kg   Physical Examination: General chronically ill-appearing 48 year old male patient currently lying in bed no distress on aerosol trach collar HEENT  normal cephalic atraumatic does have left facial droop, chronic right facial twitching, size 6 cuffed tracheostomy is midline stoma unremarkable Pulmonary diminished bilaterally no current accessory use Cardiac regular rate and rhythm Abdomen soft nontender has a PEG GU clear yellow Neuro eyes are open, he does not follow commands, he is not interactive, has chronic facial twitching on the right.  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 Not a candidate for decannulation given ineffective airway clearance -course since then has been c/b recurrent HCAP, variable vent need-- at times having decompensations req 24hr vent.  -most recently he has had noct hypercarbia and its felt that he needs noct vent. He continues to demonstrate hypercarbia after prolonged ATC trials  Plan: Not candidate for decannulation EVER Cont ATC during day as tolerated; mandatory HS and PRN Ventilation Routine trach care  PRN nebs VAP bundle   Best Practice (right click and Reselect all SmartList Selections daily)   Per primary  We will cont to follow   Critical care time: 22 minutes

## 2024-05-08 NOTE — Progress Notes (Signed)
 PROGRESS NOTE  Garrett [name] FMW:[id number] DOB: 09-29-1975   PCP: Pcp, No  Patient is from: Home  DOA: 10/27/2023 LOS: 194  Chief complaints Chief Complaint  Patient presents with   Unresponsive     Brief Narrative / Interim history:  48 y/o male with past medical history of hypertension was found down at home agonal breathing. History of uncontrolled hypertension but never had been to the doctor. Patient was noted to have a large (4.1 cm) hemorrhage centered in the pons with intraventricular extension into the fourth ventricle and also extension into the basal cisterns and trace additional subarachnoid hemorrhage along the left parietal convexity. BP in ED was 262/156. CXR revealed RUL and perihilar infiltrates suggesting aspiration pneumonia. ED labs also revealed PH 7.169, LA 4.4, CPK 985. Patient was intubated in ED and was admitted hospital.    He had prolonged complicated hospital stay pontine hemorrhage, biparietal SAH.  Patient's mental status poor, s/p tracheostomy and PEG placement.  Sputum cultures positive for MRSA on contact precautions.  Patient had prolonged difficulty weaning from vent.  Patient is on trach collar for few days but later required ventilatory support mostly at nights and is currently in ICU.  Subjective: Seen and examined earlier this morning.  No major events overnight or this morning.  Patient is in vegetative state.  Blinks his eyes but does not respond to verbal or physical stimulation.  Objective: Vitals:   05/08/24 1000 05/08/24 1055 05/08/24 1100 05/08/24 1151  BP: 124/88  (!) 130/93   Pulse: 88 75 77   Resp: (!) 30 (!) 27 (!) 23   Temp:    98.5 F (36.9 C)  TempSrc:    Axillary  SpO2: 95% 96% 96%   Weight:      Height:        Examination:  GENERAL: No apparent distress.  HEENT: MMM.  Left eye conjunctival injection and corneal clouding. NECK: Tracheostomy in place. RESP:  No IWOB.  Fair aeration bilaterally. CVS:  RRR. Heart  sounds normal.  ABD/GI/GU: BS+. Abd soft, NTND.  G-tube in place. MSK/EXT: No apparent deformity but does not move extremities. NEURO: Seems awake.  Does not response to verbal or tactile stimuli.  Blinks eyes.  Does not move extremities. PSYCH: No distress or agitation.   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 -Continue supportive care-trach and vent and tube feed.  -On and off tachycardia, tachypnea noted. -Continue amlodipine , Lopressor  and as needed hydralazine . -Mental status continue to be poor.   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  Not a candidate for decannulation given ineffective airway clearance, -Continue trach collar during day with vent support at night. On and off tachypnea persists. -Continue trach care per protocol. -Pulmonary follow up appreciated.    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 Waylan evaluated and placed tarsorraphy.   -8/29-ophtho rec: Erythromycin  eyedrop q2h, tape eyes with paper tape if not blinking. -9/14, ophtho, Dr. Leane reengaged due to worsening conjunctivitis and clouding I recommended second antibiotic, gatifloxacin  4 times daily.per Optho, no epithelial defects noted.    Inadequate oral intake Body mass index is 30.84 kg/m. Nutrition Problem: Inadequate oral intake Etiology: inability to eat Signs/Symptoms: NPO status Interventions: Tube feeding, Prostat, MVI, Juven   DVT prophylaxis:  enoxaparin  (LOVENOX ) injection 40 mg Start: 03/06/24 1000 SCDs Start: 10/27/23 2226  Code Status: Full code Family Communication: None at bedside Level  of care: ICU Status is: Inpatient Remains inpatient appropriate because: Intracranial hemorrhage, vegetative state   Final disposition: Unclear   35 minutes with more than 50% spent in reviewing records, counseling  patient/family and coordinating care.   Sch Meds:  Scheduled Meds:  amLODipine   10 mg Per Tube Daily   Chlorhexidine  Gluconate Cloth  6 each Topical Q0600   enoxaparin  (LOVENOX ) injection  40 mg Subcutaneous Daily   erythromycin    Both Eyes Q2H   famotidine   20 mg Per Tube BID   feeding supplement (JEVITY 1.5 CAL/FIBER)  237 mL Per Tube 5 X Daily   feeding supplement (PROSource TF20)  60 mL Per Tube TID   free water   100 mL Per Tube Q6H   gatifloxacin   1 drop Both Eyes QID   insulin  aspart  0-9 Units Subcutaneous Q6H   metoprolol  tartrate  75 mg Per Tube BID   multivitamin with minerals  1 tablet Per Tube Daily   mouth rinse  15 mL Mouth Rinse Q2H   polyethylene glycol  17 g Per Tube Daily   Continuous Infusions: PRN Meds:.acetaminophen  **OR** acetaminophen  (TYLENOL ) oral liquid 160 mg/5 mL **OR** acetaminophen , bisacodyl , Gerhardt's butt cream, hydrALAZINE , ipratropium-albuterol , mouth rinse, traZODone   Antimicrobials: Anti-infectives (From admission, onward)    Start     Dose/Rate Route Frequency Ordered Stop   04/09/24 2200  linezolid  (ZYVOX ) tablet 600 mg  Status:  Discontinued        600 mg Per Tube Every 12 hours 04/09/24 1132 04/13/24 1332   04/09/24 1700  vancomycin  (VANCOREADY) IVPB 1500 mg/300 mL  Status:  Discontinued        1,500 mg 150 mL/hr over 120 Minutes Intravenous Every 12 hours 04/09/24 1108 04/09/24 1132   04/09/24 1000  linezolid  (ZYVOX ) IVPB 600 mg  Status:  Discontinued        600 mg 300 mL/hr over 60 Minutes Intravenous Every 12 hours 04/09/24 0527 04/09/24 1108   04/01/24 1145  ceFEPIme  (MAXIPIME ) 2 g in sodium chloride  0.9 % 100 mL IVPB        2 g 200 mL/hr over 30 Minutes Intravenous Every 8 hours 04/01/24 1137 04/14/24 2230   02/21/24 1000  vancomycin  (VANCOREADY) IVPB 1250 mg/250 mL        1,250 mg 166.7 mL/hr over 90 Minutes Intravenous Every 12 hours 02/20/24 2158 02/28/24 0130   02/16/24 1445  ceFEPIme  (MAXIPIME ) 2 g in sodium chloride  0.9 %  100 mL IVPB        2 g 200 mL/hr over 30 Minutes Intravenous Every 8 hours 02/16/24 1348 02/27/24 2322   02/16/24 0945  vancomycin  (VANCOREADY) IVPB 1500 mg/300 mL  Status:  Discontinued        1,500 mg 150 mL/hr over 120 Minutes Intravenous Every 12 hours 02/16/24 0848 02/20/24 2158   02/15/24 1215  Ampicillin -Sulbactam (UNASYN ) 3 g in sodium chloride  0.9 % 100 mL IVPB  Status:  Discontinued        3 g 200 mL/hr over 30 Minutes Intravenous Every 6 hours 02/15/24 1125 02/16/24 1226   01/24/24 2000  vancomycin  (VANCOREADY) IVPB 1250 mg/250 mL        1,250 mg 166.7 mL/hr over 90 Minutes Intravenous Every 24 hours 01/24/24 1154 01/27/24 0125   01/22/24 2200  Vancomycin  (VANCOCIN ) 1,250 mg in sodium chloride  0.9 % 250 mL IVPB  Status:  Discontinued        1,250 mg 166.7 mL/hr over 90 Minutes Intravenous Every 24 hours 01/22/24 1013  01/22/24 1020   01/22/24 2000  vancomycin  (VANCOREADY) IVPB 1250 mg/250 mL  Status:  Discontinued        1,250 mg 166.7 mL/hr over 90 Minutes Intravenous Every 24 hours 01/22/24 1020 01/24/24 1154   01/20/24 2000  ceFEPIme  (MAXIPIME ) 2 g in sodium chloride  0.9 % 100 mL IVPB  Status:  Discontinued        2 g 200 mL/hr over 30 Minutes Intravenous Every 8 hours 01/20/24 1900 01/24/24 1154   01/20/24 2000  Vancomycin  (VANCOCIN ) 1,250 mg in sodium chloride  0.9 % 250 mL IVPB  Status:  Discontinued        1,250 mg 166.7 mL/hr over 90 Minutes Intravenous Every 24 hours 01/20/24 1900 01/22/24 1013   01/03/24 1730  cefTRIAXone  (ROCEPHIN ) 1 g in sodium chloride  0.9 % 100 mL IVPB        1 g 200 mL/hr over 30 Minutes Intravenous Every 24 hours 01/03/24 1235 01/07/24 0710   01/02/24 1830  ceFEPIme  (MAXIPIME ) 2 g in sodium chloride  0.9 % 100 mL IVPB  Status:  Discontinued        2 g 200 mL/hr over 30 Minutes Intravenous Every 8 hours 01/02/24 1732 01/03/24 1235   01/02/24 1830  Vancomycin  (VANCOCIN ) 1,250 mg in sodium chloride  0.9 % 250 mL IVPB  Status:  Discontinued         1,250 mg 166.7 mL/hr over 90 Minutes Intravenous Every 24 hours 01/02/24 1732 01/04/24 1754   01/01/24 1200  cefTRIAXone  (ROCEPHIN ) 1 g in sodium chloride  0.9 % 100 mL IVPB  Status:  Discontinued        1 g 200 mL/hr over 30 Minutes Intravenous Every 24 hours 01/01/24 1057 01/02/24 1713   11/29/23 2200  linezolid  (ZYVOX ) tablet 600 mg        600 mg Per Tube Every 12 hours 11/29/23 1034 12/04/23 2215   11/28/23 1000  linezolid  (ZYVOX ) IVPB 600 mg  Status:  Discontinued        600 mg 300 mL/hr over 60 Minutes Intravenous Every 12 hours 11/28/23 0831 11/29/23 1034   11/23/23 1200  vancomycin  (VANCOREADY) IVPB 1250 mg/250 mL  Status:  Discontinued        1,250 mg 166.7 mL/hr over 90 Minutes Intravenous Every 24 hours 11/23/23 1036 11/28/23 0831   11/20/23 1100  vancomycin  (VANCOREADY) IVPB 1250 mg/250 mL  Status:  Discontinued        1,250 mg 166.7 mL/hr over 90 Minutes Intravenous Every 24 hours 11/20/23 0923 11/20/23 0923   11/20/23 1100  vancomycin  (VANCOREADY) IVPB 1250 mg/250 mL  Status:  Discontinued        1,250 mg 166.7 mL/hr over 90 Minutes Intravenous Every 24 hours 11/20/23 0923 11/23/23 1036   11/15/23 2300  vancomycin  (VANCOREADY) IVPB 1250 mg/250 mL        1,250 mg 166.7 mL/hr over 90 Minutes Intravenous Every 24 hours 11/15/23 2215 11/19/23 0924   11/12/23 2200  vancomycin  (VANCOREADY) IVPB 750 mg/150 mL  Status:  Discontinued        750 mg 150 mL/hr over 60 Minutes Intravenous Every 12 hours 11/12/23 0855 11/15/23 2215   11/12/23 0930  vancomycin  (VANCOREADY) IVPB 2000 mg/400 mL        2,000 mg 200 mL/hr over 120 Minutes Intravenous  Once 11/12/23 0838 11/12/23 1116   11/11/23 1345  Ampicillin -Sulbactam (UNASYN ) 3 g in sodium chloride  0.9 % 100 mL IVPB  Status:  Discontinued        3  g 200 mL/hr over 30 Minutes Intravenous Every 6 hours 11/11/23 1257 11/12/23 0836   10/28/23 0400  Ampicillin -Sulbactam (UNASYN ) 3 g in sodium chloride  0.9 % 100 mL IVPB  Status:  Discontinued         3 g 200 mL/hr over 30 Minutes Intravenous Every 6 hours 10/27/23 2151 11/02/23 0945   10/27/23 1845  vancomycin  (VANCOCIN ) IVPB 1000 mg/200 mL premix  Status:  Discontinued        1,000 mg 200 mL/hr over 60 Minutes Intravenous  Once 10/27/23 1843 10/27/23 1843   10/27/23 1845  ceFEPIme  (MAXIPIME ) 2 g in sodium chloride  0.9 % 100 mL IVPB        2 g 200 mL/hr over 30 Minutes Intravenous  Once 10/27/23 1843 10/27/23 2024   10/27/23 1845  vancomycin  (VANCOREADY) IVPB 2000 mg/400 mL        2,000 mg 200 mL/hr over 120 Minutes Intravenous  Once 10/27/23 1843 10/27/23 2123        I have personally reviewed the following labs and images: CBC: Recent Labs  Lab 05/05/24 0908 05/05/24 1010  WBC  --  9.5  HGB 11.2* 10.9*  HCT 33.0* 37.2*  MCV  --  92.3  PLT  --  363   BMP &GFR Recent Labs  Lab 05/03/24 0337 05/05/24 0908  NA 144 148*  K 4.3 3.8  CL 103  --   CO2 28  --   GLUCOSE 106*  --   BUN 29*  --   CREATININE 0.52*  --   CALCIUM 9.4  --    Estimated Creatinine Clearance: 125.6 mL/min (A) (by C-G formula based on SCr of 0.52 mg/dL (L)). Liver & Pancreas: No results for input(s): AST, ALT, ALKPHOS, BILITOT, PROT, ALBUMIN  in the last 168 hours. No results for input(s): LIPASE, AMYLASE in the last 168 hours. No results for input(s): AMMONIA in the last 168 hours. Diabetic: No results for input(s): HGBA1C in the last 72 hours. Recent Labs  Lab 05/07/24 1159 05/07/24 1801 05/07/24 2323 05/08/24 0537 05/08/24 1148  GLUCAP 140* 146* 145* 108* 128*   Cardiac Enzymes: No results for input(s): CKTOTAL, CKMB, CKMBINDEX, TROPONINI in the last 168 hours. No results for input(s): PROBNP in the last 8760 hours. Coagulation Profile: No results for input(s): INR, PROTIME in the last 168 hours. Thyroid Function Tests: No results for input(s): TSH, T4TOTAL, FREET4, T3FREE, THYROIDAB in the last 72 hours. Lipid Profile: No  results for input(s): CHOL, HDL, LDLCALC, TRIG, CHOLHDL, LDLDIRECT in the last 72 hours. Anemia Panel: No results for input(s): VITAMINB12, FOLATE, FERRITIN, TIBC, IRON, RETICCTPCT in the last 72 hours. Urine analysis:    Component Value Date/Time   COLORURINE YELLOW 11/07/2023 0930   APPEARANCEUR CLEAR 11/07/2023 0930   LABSPEC 1.017 11/07/2023 0930   PHURINE 5.0 11/07/2023 0930   GLUCOSEU NEGATIVE 11/07/2023 0930   HGBUR NEGATIVE 11/07/2023 0930   BILIRUBINUR NEGATIVE 11/07/2023 0930   KETONESUR NEGATIVE 11/07/2023 0930   PROTEINUR NEGATIVE 11/07/2023 0930   NITRITE NEGATIVE 11/07/2023 0930   LEUKOCYTESUR NEGATIVE 11/07/2023 0930   Sepsis Labs: Invalid input(s): PROCALCITONIN, LACTICIDVEN  Microbiology: No results found for this or any previous visit (from the past 240 hours).  Radiology Studies: No results found.    Lon Klippel T. Yarden Hillis [facility]  If 7PM-7AM, please contact night-coverage [email or web address] 05/08/2024, 12:19 PM

## 2024-05-08 NOTE — Procedures (Signed)
 Tracheostomy Change Note  Patient Details:   Name: Garrett [name] DOB: Mar 24, 1976 MRN: [id number]    Airway Documentation:  Pt's trach change; Shiley flex 6 cuffed.   Evaluation  O2 sats: stable throughout Complications: No apparent complications Patient did tolerate procedure well. Bilateral Breath Sounds: Diminished, Clear  RT X2 changed trach. VSS and positive color change. Pt tolerated well.    Garrett [name] 05/08/2024, 4:33 PM

## 2024-05-09 DIAGNOSIS — J9601 Acute respiratory failure with hypoxia: Secondary | ICD-10-CM | POA: Diagnosis not present

## 2024-05-09 DIAGNOSIS — I619 Nontraumatic intracerebral hemorrhage, unspecified: Secondary | ICD-10-CM | POA: Diagnosis not present

## 2024-05-09 DIAGNOSIS — Z789 Other specified health status: Secondary | ICD-10-CM | POA: Diagnosis not present

## 2024-05-09 DIAGNOSIS — Z930 Tracheostomy status: Secondary | ICD-10-CM | POA: Diagnosis not present

## 2024-05-09 DIAGNOSIS — L8990 Pressure ulcer of unspecified site, unspecified stage: Secondary | ICD-10-CM | POA: Diagnosis not present

## 2024-05-09 DIAGNOSIS — J9611 Chronic respiratory failure with hypoxia: Secondary | ICD-10-CM | POA: Diagnosis not present

## 2024-05-09 LAB — GLUCOSE, CAPILLARY
Glucose-Capillary: 118 mg/dL — ABNORMAL HIGH (ref 70–99)
Glucose-Capillary: 98 mg/dL (ref 70–99)
Glucose-Capillary: 139 mg/dL — ABNORMAL HIGH (ref 70–99)

## 2024-05-09 NOTE — Plan of Care (Signed)
 Problem: Intracerebral Hemorrhage Tissue Perfusion: Goal: Complications of Intracerebral Hemorrhage will be minimized 05/09/2024 0437 by Jaquesha Boroff, Alyce BROCKS, RN Outcome: Progressing 05/09/2024 0437 by Tamikka Pilger, Alyce BROCKS, RN Outcome: Progressing   Problem: Nutrition: Goal: Risk of aspiration will decrease 05/09/2024 0437 by Neelie Welshans, Alyce BROCKS, RN Outcome: Progressing 05/09/2024 0437 by Jeffree Cazeau, Alyce BROCKS, RN Outcome: Progressing Goal: Dietary intake will improve 05/09/2024 0437 by Bernese Doffing, Alyce BROCKS, RN Outcome: Progressing 05/09/2024 0437 by Alyia Lacerte, Alyce BROCKS, RN Outcome: Progressing   Problem: Fluid Volume: Goal: Ability to maintain a balanced intake and output will improve 05/09/2024 0437 by Michail Boyte, Alyce BROCKS, RN Outcome: Progressing 05/09/2024 0437 by Starla Deller, Alyce BROCKS, RN Outcome: Progressing   Problem: Skin Integrity: Goal: Risk for impaired skin integrity will decrease 05/09/2024 0437 by Meeghan Skipper, Alyce BROCKS, RN Outcome: Progressing 05/09/2024 0437 by Jakari Sada, Alyce BROCKS, RN Outcome: Progressing   Problem: Tissue Perfusion: Goal: Adequacy of tissue perfusion will improve 05/09/2024 0437 by Artemus Romanoff, Alyce BROCKS, RN Outcome: Progressing 05/09/2024 0437 by Jadden Yim, Alyce BROCKS, RN Outcome: Progressing   Problem: Clinical Measurements: Goal: Ability to maintain clinical measurements within normal limits will improve 05/09/2024 0437 by Elzora Cullins, Alyce BROCKS, RN Outcome: Progressing 05/09/2024 0437 by Jahlia Omura, Alyce BROCKS, RN Outcome: Progressing Goal: Will remain free from infection 05/09/2024 0437 by Dannon Perlow, Alyce BROCKS, RN Outcome: Progressing 05/09/2024 0437 by Ceniyah Thorp, Alyce BROCKS, RN Outcome: Progressing Goal: Diagnostic test results will improve 05/09/2024 0437 by Kimala Horne, Alyce BROCKS, RN Outcome: Progressing 05/09/2024 0437 by Eloyce Bultman, Alyce BROCKS, RN Outcome: Progressing Goal: Respiratory complications will improve 05/09/2024 0437 by Calandria Mullings, Alyce BROCKS, RN Outcome: Progressing 05/09/2024 0437 by Pasqualina Colasurdo, Alyce BROCKS, RN Outcome: Progressing Goal: Cardiovascular complication will be  avoided 05/09/2024 0437 by Catera Hankins, Alyce BROCKS, RN Outcome: Progressing 05/09/2024 0437 by Jennier Schissler, Alyce BROCKS, RN Outcome: Progressing   Problem: Activity: Goal: Risk for activity intolerance will decrease 05/09/2024 0437 by Annah Jasko, Alyce BROCKS, RN Outcome: Progressing 05/09/2024 0437 by Winter Trefz, Alyce BROCKS, RN Outcome: Progressing   Problem: Nutrition: Goal: Adequate nutrition will be maintained 05/09/2024 0437 by Rivkah Wolz, Alyce BROCKS, RN Outcome: Progressing 05/09/2024 0437 by Lakeem Rozo, Alyce BROCKS, RN Outcome: Progressing   Problem: Elimination: Goal: Will not experience complications related to bowel motility 05/09/2024 0437 by Lebron Nauert, Alyce BROCKS, RN Outcome: Progressing 05/09/2024 0437 by Ande Therrell, Alyce BROCKS, RN Outcome: Progressing Goal: Will not experience complications related to urinary retention 05/09/2024 0437 by Abbigaile Rockman, Alyce BROCKS, RN Outcome: Progressing 05/09/2024 0437 by Maudell Stanbrough, Alyce BROCKS, RN Outcome: Progressing   Problem: Pain Managment: Goal: General experience of comfort will improve and/or be controlled 05/09/2024 0437 by Emelyn Roen, Alyce BROCKS, RN Outcome: Progressing 05/09/2024 0437 by Lesley Atkin, Alyce BROCKS, RN Outcome: Progressing   Problem: Safety: Goal: Ability to remain free from injury will improve 05/09/2024 0437 by Karrah Mangini, Alyce BROCKS, RN Outcome: Progressing 05/09/2024 0437 by Shunta Mclaurin, Alyce BROCKS, RN Outcome: Progressing   Problem: Skin Integrity: Goal: Risk for impaired skin integrity will decrease 05/09/2024 0437 by Ivry Pigue, Alyce BROCKS, RN Outcome: Progressing 05/09/2024 0437 by Melady Chow, Alyce BROCKS, RN Outcome: Progressing   Problem: Activity: Goal: Ability to tolerate increased activity will improve 05/09/2024 0437 by Tashya Alberty, Alyce BROCKS, RN Outcome: Progressing 05/09/2024 0437 by Izack Hoogland, Alyce BROCKS, RN Outcome: Progressing   Problem: Respiratory: Goal: Patent airway maintenance will improve 05/09/2024 0437 by Brixton Franko, Alyce BROCKS, RN Outcome: Progressing 05/09/2024 0437 by Brandalynn Ofallon, Alyce BROCKS, RN Outcome: Progressing   Problem: Activity: Goal: Ability to  tolerate increased activity will improve 05/09/2024 0437 by Draysen Weygandt, Alyce BROCKS, RN Outcome: Progressing 05/09/2024 0437 by  Mccartney Chuba, Alyce BROCKS, RN Outcome: Progressing   Problem: Respiratory: Goal: Ability to maintain a clear airway and adequate ventilation will improve 05/09/2024 0437 by Arlett Goold, Alyce BROCKS, RN Outcome: Progressing 05/09/2024 0437 by Willona Phariss, Alyce BROCKS, RN Outcome: Progressing   Problem: Role Relationship: Goal: Method of communication will improve 05/09/2024 0437 by Aneya Daddona, Alyce BROCKS, RN Outcome: Progressing 05/09/2024 0437 by Elion Hocker, Alyce BROCKS, RN Outcome: Progressing   Problem: Education: Goal: Knowledge of disease or condition will improve 05/09/2024 0437 by Cellie Dardis, Alyce BROCKS, RN Outcome: Progressing 05/09/2024 0437 by Psalm Arman, Alyce BROCKS, RN Outcome: Progressing   Problem: Intracerebral Hemorrhage Tissue Perfusion: Goal: Complications of Intracerebral Hemorrhage will be minimized 05/09/2024 0437 by Gerldine Suleiman, Alyce BROCKS, RN Outcome: Progressing 05/09/2024 0437 by Meghen Akopyan, Alyce BROCKS, RN Outcome: Progressing   Problem: Self-Care: Goal: Ability to participate in self-care as condition permits will improve 05/09/2024 0437 by Henli Hey, Alyce BROCKS, RN Outcome: Progressing 05/09/2024 0437 by Aracely Rickett, Alyce BROCKS, RN Outcome: Progressing Goal: Verbalization of feelings and concerns over difficulty with self-care will improve 05/09/2024 0437 by Ileana Chalupa, Alyce BROCKS, RN Outcome: Progressing 05/09/2024 0437 by Jarrah Babich, Alyce BROCKS, RN Outcome: Progressing Goal: Ability to communicate needs accurately will improve 05/09/2024 0437 by Brya Simerly, Alyce BROCKS, RN Outcome: Progressing 05/09/2024 0437 by Alba Kriesel, Alyce BROCKS, RN Outcome: Progressing   Problem: Nutrition: Goal: Risk of aspiration will decrease 05/09/2024 0437 by Tezra Mahr, Alyce BROCKS, RN Outcome: Progressing 05/09/2024 0437 by Murriel Eidem, Alyce BROCKS, RN Outcome: Progressing Goal: Dietary intake will improve 05/09/2024 0437 by Natale Thoma, Alyce BROCKS, RN Outcome: Progressing 05/09/2024 0437 by Audrianna Driskill, Alyce BROCKS, RN Outcome:  Progressing   Problem: Education: Goal: Knowledge of disease or condition will improve 05/09/2024 0437 by Alee Gressman, Alyce BROCKS, RN Outcome: Progressing 05/09/2024 0437 by Quayshawn Nin, Alyce BROCKS, RN Outcome: Progressing Goal: Knowledge of secondary prevention will improve (MUST DOCUMENT ALL) 05/09/2024 0437 by Lesta Limbert, Alyce BROCKS, RN Outcome: Progressing 05/09/2024 0437 by Tijuana Scheidegger, Alyce BROCKS, RN Outcome: Progressing Goal: Knowledge of patient specific risk factors will improve (DELETE if not current risk factor) 05/09/2024 0437 by Kaylanni Ezelle, Alyce BROCKS, RN Outcome: Progressing 05/09/2024 0437 by Martell Mcfadyen, Alyce BROCKS, RN Outcome: Progressing   Problem: Intracerebral Hemorrhage Tissue Perfusion: Goal: Complications of Intracerebral Hemorrhage will be minimized 05/09/2024 0437 by Brandis Matsuura, Alyce BROCKS, RN Outcome: Progressing 05/09/2024 0437 by Lucillie Kiesel, Alyce BROCKS, RN Outcome: Progressing   Problem: Coping: Goal: Will verbalize positive feelings about self 05/09/2024 0437 by Beverley Sherrard, Alyce BROCKS, RN Outcome: Progressing 05/09/2024 0437 by Rosie Golson, Alyce BROCKS, RN Outcome: Progressing Goal: Will identify appropriate support needs 05/09/2024 0437 by Yolunda Kloos, Alyce BROCKS, RN Outcome: Progressing 05/09/2024 0437 by Any Mcneice, Alyce BROCKS, RN Outcome: Progressing   Problem: Health Behavior/Discharge Planning: Goal: Ability to manage health-related needs will improve 05/09/2024 0437 by Derreck Wiltsey, Alyce BROCKS, RN Outcome: Progressing 05/09/2024 0437 by Zach Tietje, Alyce BROCKS, RN Outcome: Progressing Goal: Goals will be collaboratively established with patient/family 05/09/2024 0437 by Atonya Templer, Alyce BROCKS, RN Outcome: Progressing 05/09/2024 0437 by Casha Estupinan, Alyce BROCKS, RN Outcome: Progressing   Problem: Self-Care: Goal: Ability to participate in self-care as condition permits will improve 05/09/2024 0437 by Uziah Sorter, Alyce BROCKS, RN Outcome: Progressing 05/09/2024 0437 by Jessah Danser, Alyce BROCKS, RN Outcome: Progressing Goal: Verbalization of feelings and concerns over difficulty with self-care will improve 05/09/2024  0437 by Sladen Plancarte, Alyce BROCKS, RN Outcome: Progressing 05/09/2024 0437 by Sherrica Niehaus, Alyce BROCKS, RN Outcome: Progressing Goal: Ability to communicate needs accurately will improve 05/09/2024 0437 by Alexiz Sustaita, Alyce BROCKS, RN Outcome: Progressing 05/09/2024 0437 by Liticia Gasior, Alyce BROCKS, RN Outcome: Progressing  Problem: Nutrition: Goal: Risk of aspiration will decrease 05/09/2024 0437 by Geroldine Esquivias, Alyce BROCKS, RN Outcome: Progressing 05/09/2024 0437 by Kiron Osmun, Alyce BROCKS, RN Outcome: Progressing Goal: Dietary intake will improve 05/09/2024 0437 by Leather Estis, Alyce BROCKS, RN Outcome: Progressing 05/09/2024 0437 by Solina Heron, Alyce BROCKS, RN Outcome: Progressing

## 2024-05-09 NOTE — Plan of Care (Signed)
  Problem: Intracerebral Hemorrhage Tissue Perfusion: Goal: Complications of Intracerebral Hemorrhage will be minimized Outcome: Progressing   Problem: Nutrition: Goal: Risk of aspiration will decrease Outcome: Progressing Goal: Dietary intake will improve Outcome: Progressing   Problem: Education: Goal: Knowledge of disease or condition will improve Outcome: Progressing   Problem: Intracerebral Hemorrhage Tissue Perfusion: Goal: Complications of Intracerebral Hemorrhage will be minimized Outcome: Progressing   Problem: Nutrition: Goal: Risk of aspiration will decrease Outcome: Progressing Goal: Dietary intake will improve Outcome: Progressing   Problem: Self-Care: Goal: Ability to participate in self-care as condition permits will improve Outcome: Progressing Goal: Verbalization of feelings and concerns over difficulty with self-care will improve Outcome: Progressing Goal: Ability to communicate needs accurately will improve Outcome: Progressing

## 2024-05-09 NOTE — Progress Notes (Signed)
 PROGRESS NOTE  Garrett [name] FMW:[id number] DOB: July 05, 1976   PCP: Pcp, No  Patient is from: Home  DOA: 10/27/2023 LOS: 195  Chief complaints Chief Complaint  Patient presents with   Unresponsive     Brief Narrative / Interim history:  48 y/o male with past medical history of hypertension was found down at home agonal breathing. History of uncontrolled hypertension but never had been to the doctor. Patient was noted to have a large (4.1 cm) hemorrhage centered in the pons with intraventricular extension into the fourth ventricle and also extension into the basal cisterns and trace additional subarachnoid hemorrhage along the left parietal convexity. BP in ED was 262/156. CXR revealed RUL and perihilar infiltrates suggesting aspiration pneumonia. ED labs also revealed PH 7.169, LA 4.4, CPK 985. Patient was intubated in ED and was admitted hospital.    He had prolonged complicated hospital stay pontine hemorrhage, biparietal SAH.  Patient's mental status poor, s/p tracheostomy and PEG placement.  Sputum cultures positive for MRSA on contact precautions.  Patient had prolonged difficulty weaning from vent.  Patient is on trach collar for few days but later required ventilatory support mostly at nights and is currently in ICU.  Subjective: Seen and examined earlier this morning.  No major events overnight or this morning.  Seems awake.  Difficult to tell if he is tracking.  Objective: Vitals:   05/09/24 1017 05/09/24 1100 05/09/24 1200 05/09/24 1316  BP: 124/87 119/89 (!) 123/91   Pulse: 80 78 77 76  Resp:  (!) 24 (!) 24 (!) 24  Temp:      TempSrc:      SpO2:  99% 96% 94%  Weight:      Height:        Examination:  GENERAL: No apparent distress.  HEENT: MMM.  Left eye conjunctival injection and corneal clouding. NECK: Tracheostomy in place. RESP:  No IWOB.  Fair aeration bilaterally. CVS:  RRR. Heart sounds normal.  ABD/GI/GU: BS+. Abd soft, NTND.  G-tube in  place. MSK/EXT: No apparent deformity but does not move extremities. NEURO: Does not response to verbal or tactile stimuli.  Blinks eyes.  Tracking?  Does not move extremities. PSYCH: No distress or agitation.   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 -Continue supportive care-trach and vent and tube feed.  -On and off tachycardia, tachypnea noted. -Continue amlodipine , Lopressor  and as needed hydralazine . -Mental status continue to be poor.   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  Not a candidate for decannulation given ineffective airway clearance, -Continue trach collar during day with vent support at night. On and off tachypnea persists. -Continue trach care per protocol. -Pulmonary follow up appreciated.    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 Waylan evaluated and placed tarsorraphy.   -8/29-ophtho rec: Erythromycin  eyedrop q2h, tape eyes with paper tape if not blinking. -9/14, ophtho, Dr. Leane reengaged due to worsening conjunctivitis and clouding I recommended second antibiotic, gatifloxacin  4 times daily.per Optho, no epithelial defects noted.    Inadequate oral intake Body mass index is 30.84 kg/m. Nutrition Problem: Inadequate oral intake Etiology: inability to eat Signs/Symptoms: NPO status Interventions: Tube feeding, Prostat, MVI, Juven   DVT prophylaxis:  enoxaparin  (LOVENOX ) injection 40 mg Start: 03/06/24 1000 SCDs Start: 10/27/23 2226  Code Status: Full code Family Communication: None at bedside Level of care: ICU Status is: Inpatient Remains inpatient appropriate because: Intracranial hemorrhage,  vegetative state   Final disposition: Unclear   35 minutes with more than 50% spent in reviewing records, counseling patient/family and coordinating care.   Sch Meds:  Scheduled  Meds:  amLODipine   10 mg Per Tube Daily   Chlorhexidine  Gluconate Cloth  6 each Topical Q0600   enoxaparin  (LOVENOX ) injection  40 mg Subcutaneous Daily   erythromycin    Both Eyes Q2H   famotidine   20 mg Per Tube BID   feeding supplement (JEVITY 1.5 CAL/FIBER)  237 mL Per Tube 5 X Daily   feeding supplement (PROSource TF20)  60 mL Per Tube TID   free water   100 mL Per Tube Q6H   gatifloxacin   1 drop Both Eyes QID   insulin  aspart  0-9 Units Subcutaneous Q6H   metoprolol  tartrate  75 mg Per Tube BID   multivitamin with minerals  1 tablet Per Tube Daily   mouth rinse  15 mL Mouth Rinse Q2H   polyethylene glycol  17 g Per Tube Daily   Continuous Infusions: PRN Meds:.acetaminophen  **OR** acetaminophen  (TYLENOL ) oral liquid 160 mg/5 mL **OR** acetaminophen , bisacodyl , Gerhardt's butt cream, hydrALAZINE , ipratropium-albuterol , mouth rinse, traZODone   Antimicrobials: Anti-infectives (From admission, onward)    Start     Dose/Rate Route Frequency Ordered Stop   04/09/24 2200  linezolid  (ZYVOX ) tablet 600 mg  Status:  Discontinued        600 mg Per Tube Every 12 hours 04/09/24 1132 04/13/24 1332   04/09/24 1700  vancomycin  (VANCOREADY) IVPB 1500 mg/300 mL  Status:  Discontinued        1,500 mg 150 mL/hr over 120 Minutes Intravenous Every 12 hours 04/09/24 1108 04/09/24 1132   04/09/24 1000  linezolid  (ZYVOX ) IVPB 600 mg  Status:  Discontinued        600 mg 300 mL/hr over 60 Minutes Intravenous Every 12 hours 04/09/24 0527 04/09/24 1108   04/01/24 1145  ceFEPIme  (MAXIPIME ) 2 g in sodium chloride  0.9 % 100 mL IVPB        2 g 200 mL/hr over 30 Minutes Intravenous Every 8 hours 04/01/24 1137 04/14/24 2230   02/21/24 1000  vancomycin  (VANCOREADY) IVPB 1250 mg/250 mL        1,250 mg 166.7 mL/hr over 90 Minutes Intravenous Every 12 hours 02/20/24 2158 02/28/24 0130   02/16/24 1445  ceFEPIme  (MAXIPIME ) 2 g in sodium chloride  0.9 % 100 mL IVPB        2 g 200 mL/hr over 30 Minutes Intravenous  Every 8 hours 02/16/24 1348 02/27/24 2322   02/16/24 0945  vancomycin  (VANCOREADY) IVPB 1500 mg/300 mL  Status:  Discontinued        1,500 mg 150 mL/hr over 120 Minutes Intravenous Every 12 hours 02/16/24 0848 02/20/24 2158   02/15/24 1215  Ampicillin -Sulbactam (UNASYN ) 3 g in sodium chloride  0.9 % 100 mL IVPB  Status:  Discontinued        3 g 200 mL/hr over 30 Minutes Intravenous Every 6 hours 02/15/24 1125 02/16/24 1226   01/24/24 2000  vancomycin  (VANCOREADY) IVPB 1250 mg/250 mL        1,250 mg 166.7 mL/hr over 90 Minutes Intravenous Every 24 hours 01/24/24 1154 01/27/24 0125   01/22/24 2200  Vancomycin  (VANCOCIN ) 1,250 mg in sodium chloride  0.9 % 250 mL IVPB  Status:  Discontinued        1,250 mg 166.7 mL/hr over 90 Minutes Intravenous Every 24 hours 01/22/24 1013 01/22/24 1020   01/22/24 2000  vancomycin  (VANCOREADY) IVPB 1250 mg/250  mL  Status:  Discontinued        1,250 mg 166.7 mL/hr over 90 Minutes Intravenous Every 24 hours 01/22/24 1020 01/24/24 1154   01/20/24 2000  ceFEPIme  (MAXIPIME ) 2 g in sodium chloride  0.9 % 100 mL IVPB  Status:  Discontinued        2 g 200 mL/hr over 30 Minutes Intravenous Every 8 hours 01/20/24 1900 01/24/24 1154   01/20/24 2000  Vancomycin  (VANCOCIN ) 1,250 mg in sodium chloride  0.9 % 250 mL IVPB  Status:  Discontinued        1,250 mg 166.7 mL/hr over 90 Minutes Intravenous Every 24 hours 01/20/24 1900 01/22/24 1013   01/03/24 1730  cefTRIAXone  (ROCEPHIN ) 1 g in sodium chloride  0.9 % 100 mL IVPB        1 g 200 mL/hr over 30 Minutes Intravenous Every 24 hours 01/03/24 1235 01/07/24 0710   01/02/24 1830  ceFEPIme  (MAXIPIME ) 2 g in sodium chloride  0.9 % 100 mL IVPB  Status:  Discontinued        2 g 200 mL/hr over 30 Minutes Intravenous Every 8 hours 01/02/24 1732 01/03/24 1235   01/02/24 1830  Vancomycin  (VANCOCIN ) 1,250 mg in sodium chloride  0.9 % 250 mL IVPB  Status:  Discontinued        1,250 mg 166.7 mL/hr over 90 Minutes Intravenous Every 24 hours  01/02/24 1732 01/04/24 1754   01/01/24 1200  cefTRIAXone  (ROCEPHIN ) 1 g in sodium chloride  0.9 % 100 mL IVPB  Status:  Discontinued        1 g 200 mL/hr over 30 Minutes Intravenous Every 24 hours 01/01/24 1057 01/02/24 1713   11/29/23 2200  linezolid  (ZYVOX ) tablet 600 mg        600 mg Per Tube Every 12 hours 11/29/23 1034 12/04/23 2215   11/28/23 1000  linezolid  (ZYVOX ) IVPB 600 mg  Status:  Discontinued        600 mg 300 mL/hr over 60 Minutes Intravenous Every 12 hours 11/28/23 0831 11/29/23 1034   11/23/23 1200  vancomycin  (VANCOREADY) IVPB 1250 mg/250 mL  Status:  Discontinued        1,250 mg 166.7 mL/hr over 90 Minutes Intravenous Every 24 hours 11/23/23 1036 11/28/23 0831   11/20/23 1100  vancomycin  (VANCOREADY) IVPB 1250 mg/250 mL  Status:  Discontinued        1,250 mg 166.7 mL/hr over 90 Minutes Intravenous Every 24 hours 11/20/23 0923 11/20/23 0923   11/20/23 1100  vancomycin  (VANCOREADY) IVPB 1250 mg/250 mL  Status:  Discontinued        1,250 mg 166.7 mL/hr over 90 Minutes Intravenous Every 24 hours 11/20/23 0923 11/23/23 1036   11/15/23 2300  vancomycin  (VANCOREADY) IVPB 1250 mg/250 mL        1,250 mg 166.7 mL/hr over 90 Minutes Intravenous Every 24 hours 11/15/23 2215 11/19/23 0924   11/12/23 2200  vancomycin  (VANCOREADY) IVPB 750 mg/150 mL  Status:  Discontinued        750 mg 150 mL/hr over 60 Minutes Intravenous Every 12 hours 11/12/23 0855 11/15/23 2215   11/12/23 0930  vancomycin  (VANCOREADY) IVPB 2000 mg/400 mL        2,000 mg 200 mL/hr over 120 Minutes Intravenous  Once 11/12/23 0838 11/12/23 1116   11/11/23 1345  Ampicillin -Sulbactam (UNASYN ) 3 g in sodium chloride  0.9 % 100 mL IVPB  Status:  Discontinued        3 g 200 mL/hr over 30 Minutes Intravenous Every 6 hours 11/11/23 1257  11/12/23 0836   10/28/23 0400  Ampicillin -Sulbactam (UNASYN ) 3 g in sodium chloride  0.9 % 100 mL IVPB  Status:  Discontinued        3 g 200 mL/hr over 30 Minutes Intravenous Every 6 hours  10/27/23 2151 11/02/23 0945   10/27/23 1845  vancomycin  (VANCOCIN ) IVPB 1000 mg/200 mL premix  Status:  Discontinued        1,000 mg 200 mL/hr over 60 Minutes Intravenous  Once 10/27/23 1843 10/27/23 1843   10/27/23 1845  ceFEPIme  (MAXIPIME ) 2 g in sodium chloride  0.9 % 100 mL IVPB        2 g 200 mL/hr over 30 Minutes Intravenous  Once 10/27/23 1843 10/27/23 2024   10/27/23 1845  vancomycin  (VANCOREADY) IVPB 2000 mg/400 mL        2,000 mg 200 mL/hr over 120 Minutes Intravenous  Once 10/27/23 1843 10/27/23 2123        I have personally reviewed the following labs and images: CBC: Recent Labs  Lab 05/05/24 0908 05/05/24 1010  WBC  --  9.5  HGB 11.2* 10.9*  HCT 33.0* 37.2*  MCV  --  92.3  PLT  --  363   BMP &GFR Recent Labs  Lab 05/03/24 0337 05/05/24 0908  NA 144 148*  K 4.3 3.8  CL 103  --   CO2 28  --   GLUCOSE 106*  --   BUN 29*  --   CREATININE 0.52*  --   CALCIUM 9.4  --    Estimated Creatinine Clearance: 125.6 mL/min (A) (by C-G formula based on SCr of 0.52 mg/dL (L)). Liver & Pancreas: No results for input(s): AST, ALT, ALKPHOS, BILITOT, PROT, ALBUMIN  in the last 168 hours. No results for input(s): LIPASE, AMYLASE in the last 168 hours. No results for input(s): AMMONIA in the last 168 hours. Diabetic: No results for input(s): HGBA1C in the last 72 hours. Recent Labs  Lab 05/08/24 1148 05/08/24 1728 05/08/24 2333 05/09/24 0614 05/09/24 1317  GLUCAP 128* 106* 105* 118* 98   Cardiac Enzymes: No results for input(s): CKTOTAL, CKMB, CKMBINDEX, TROPONINI in the last 168 hours. No results for input(s): PROBNP in the last 8760 hours. Coagulation Profile: No results for input(s): INR, PROTIME in the last 168 hours. Thyroid Function Tests: No results for input(s): TSH, T4TOTAL, FREET4, T3FREE, THYROIDAB in the last 72 hours. Lipid Profile: No results for input(s): CHOL, HDL, LDLCALC, TRIG, CHOLHDL,  LDLDIRECT in the last 72 hours. Anemia Panel: No results for input(s): VITAMINB12, FOLATE, FERRITIN, TIBC, IRON, RETICCTPCT in the last 72 hours. Urine analysis:    Component Value Date/Time   COLORURINE YELLOW 11/07/2023 0930   APPEARANCEUR CLEAR 11/07/2023 0930   LABSPEC 1.017 11/07/2023 0930   PHURINE 5.0 11/07/2023 0930   GLUCOSEU NEGATIVE 11/07/2023 0930   HGBUR NEGATIVE 11/07/2023 0930   BILIRUBINUR NEGATIVE 11/07/2023 0930   KETONESUR NEGATIVE 11/07/2023 0930   PROTEINUR NEGATIVE 11/07/2023 0930   NITRITE NEGATIVE 11/07/2023 0930   LEUKOCYTESUR NEGATIVE 11/07/2023 0930   Sepsis Labs: Invalid input(s): PROCALCITONIN, LACTICIDVEN  Microbiology: No results found for this or any previous visit (from the past 240 hours).  Radiology Studies: No results found.    Garrett [name] T. Christopher Glasscock [facility]  If 7PM-7AM, please contact night-coverage [email or web address] 05/09/2024, 1:30 PM

## 2024-05-10 ENCOUNTER — Inpatient Hospital Stay (HOSPITAL_COMMUNITY)

## 2024-05-10 DIAGNOSIS — Z930 Tracheostomy status: Secondary | ICD-10-CM | POA: Diagnosis not present

## 2024-05-10 DIAGNOSIS — L8990 Pressure ulcer of unspecified site, unspecified stage: Secondary | ICD-10-CM | POA: Diagnosis not present

## 2024-05-10 DIAGNOSIS — J9611 Chronic respiratory failure with hypoxia: Secondary | ICD-10-CM | POA: Diagnosis not present

## 2024-05-10 DIAGNOSIS — Z789 Other specified health status: Secondary | ICD-10-CM | POA: Diagnosis not present

## 2024-05-10 DIAGNOSIS — J9601 Acute respiratory failure with hypoxia: Secondary | ICD-10-CM | POA: Diagnosis not present

## 2024-05-10 DIAGNOSIS — I619 Nontraumatic intracerebral hemorrhage, unspecified: Secondary | ICD-10-CM | POA: Diagnosis not present

## 2024-05-10 LAB — COMPREHENSIVE METABOLIC PANEL WITH GFR
ALT: 107 U/L — ABNORMAL HIGH (ref 0–44)
AST: 56 U/L — ABNORMAL HIGH (ref 15–41)
Albumin: 3.3 g/dL — ABNORMAL LOW (ref 3.5–5.0)
Alkaline Phosphatase: 68 U/L (ref 38–126)
Anion gap: 13 (ref 5–15)
BUN: 27 mg/dL — ABNORMAL HIGH (ref 6–20)
CO2: 27 mmol/L (ref 22–32)
Calcium: 9.3 mg/dL (ref 8.9–10.3)
Chloride: 108 mmol/L (ref 98–111)
Creatinine, Ser: 0.46 mg/dL — ABNORMAL LOW (ref 0.61–1.24)
GFR, Estimated: >60 mL/min (ref >60)
Glucose, Bld: 129 mg/dL — ABNORMAL HIGH (ref 70–99)
Potassium: 4.1 mmol/L (ref 3.5–5.1)
Sodium: 148 mmol/L — ABNORMAL HIGH (ref 135–145)
Total Bilirubin: 0.4 mg/dL (ref 0.0–1.2)
Total Protein: 8.5 g/dL — ABNORMAL HIGH (ref 6.5–8.1)

## 2024-05-10 LAB — CBC WITH DIFFERENTIAL/PLATELET
Abs Immature Granulocytes: 0.04 K/uL (ref 0.00–0.07)
Basophils Absolute: 0.1 K/uL (ref 0.0–0.1)
Basophils Relative: 1 %
Eosinophils Absolute: 0.1 K/uL (ref 0.0–0.5)
Eosinophils Relative: 1 %
HCT: 38.1 % — ABNORMAL LOW (ref 39.0–52.0)
Hemoglobin: 11.1 g/dL — ABNORMAL LOW (ref 13.0–17.0)
Immature Granulocytes: 0 %
Lymphocytes Relative: 18 %
Lymphs Abs: 1.9 K/uL (ref 0.7–4.0)
MCH: 26.8 pg (ref 26.0–34.0)
MCHC: 29.1 g/dL — ABNORMAL LOW (ref 30.0–36.0)
MCV: 92 fL (ref 80.0–100.0)
Monocytes Absolute: 0.7 K/uL (ref 0.1–1.0)
Monocytes Relative: 7 %
Neutro Abs: 7.7 K/uL (ref 1.7–7.7)
Neutrophils Relative %: 73 %
Platelets: 404 K/uL — ABNORMAL HIGH (ref 150–400)
RBC: 4.14 MIL/uL — ABNORMAL LOW (ref 4.22–5.81)
RDW: 16 % — ABNORMAL HIGH (ref 11.5–15.5)
WBC: 10.5 K/uL (ref 4.0–10.5)
nRBC: 0 % (ref 0.0–0.2)

## 2024-05-10 LAB — GLUCOSE, CAPILLARY: Glucose-Capillary: 129 mg/dL — ABNORMAL HIGH (ref 70–99)

## 2024-05-10 LAB — MAGNESIUM: Magnesium: 2.3 mg/dL (ref 1.7–2.4)

## 2024-05-10 MED ORDER — FREE WATER
200.0000 mL | Freq: Four times a day (QID) | Status: DC
  Administered 2024-05-11 – 2024-05-12 (×7): 200 mL

## 2024-05-10 NOTE — Plan of Care (Signed)
  Problem: Intracerebral Hemorrhage Tissue Perfusion: Goal: Complications of Intracerebral Hemorrhage will be minimized Outcome: Progressing   Problem: Nutrition: Goal: Risk of aspiration will decrease Outcome: Progressing Goal: Dietary intake will improve Outcome: Progressing   Problem: Fluid Volume: Goal: Ability to maintain a balanced intake and output will improve Outcome: Progressing   Problem: Skin Integrity: Goal: Risk for impaired skin integrity will decrease Outcome: Progressing   Problem: Tissue Perfusion: Goal: Adequacy of tissue perfusion will improve Outcome: Progressing   Problem: Clinical Measurements: Goal: Ability to maintain clinical measurements within normal limits will improve Outcome: Progressing Goal: Will remain free from infection Outcome: Progressing Goal: Diagnostic test results will improve Outcome: Progressing Goal: Respiratory complications will improve Outcome: Progressing Goal: Cardiovascular complication will be avoided Outcome: Progressing   Problem: Activity: Goal: Risk for activity intolerance will decrease Outcome: Progressing   Problem: Nutrition: Goal: Adequate nutrition will be maintained Outcome: Progressing   Problem: Elimination: Goal: Will not experience complications related to bowel motility Outcome: Progressing Goal: Will not experience complications related to urinary retention Outcome: Progressing   Problem: Pain Managment: Goal: General experience of comfort will improve and/or be controlled Outcome: Progressing   Problem: Safety: Goal: Ability to remain free from injury will improve Outcome: Progressing   Problem: Skin Integrity: Goal: Risk for impaired skin integrity will decrease Outcome: Progressing   Problem: Activity: Goal: Ability to tolerate increased activity will improve Outcome: Progressing   Problem: Respiratory: Goal: Patent airway maintenance will improve Outcome: Progressing   Problem:  Activity: Goal: Ability to tolerate increased activity will improve Outcome: Progressing   Problem: Respiratory: Goal: Ability to maintain a clear airway and adequate ventilation will improve Outcome: Progressing   Problem: Role Relationship: Goal: Method of communication will improve Outcome: Progressing   Problem: Education: Goal: Knowledge of disease or condition will improve Outcome: Progressing   Problem: Intracerebral Hemorrhage Tissue Perfusion: Goal: Complications of Intracerebral Hemorrhage will be minimized Outcome: Progressing   Problem: Self-Care: Goal: Ability to participate in self-care as condition permits will improve Outcome: Progressing Goal: Verbalization of feelings and concerns over difficulty with self-care will improve Outcome: Progressing Goal: Ability to communicate needs accurately will improve Outcome: Progressing   Problem: Nutrition: Goal: Risk of aspiration will decrease Outcome: Progressing Goal: Dietary intake will improve Outcome: Progressing   Problem: Education: Goal: Knowledge of disease or condition will improve Outcome: Progressing Goal: Knowledge of secondary prevention will improve (MUST DOCUMENT ALL) Outcome: Progressing Goal: Knowledge of patient specific risk factors will improve (DELETE if not current risk factor) Outcome: Progressing   Problem: Intracerebral Hemorrhage Tissue Perfusion: Goal: Complications of Intracerebral Hemorrhage will be minimized Outcome: Progressing   Problem: Coping: Goal: Will verbalize positive feelings about self Outcome: Progressing Goal: Will identify appropriate support needs Outcome: Progressing   Problem: Health Behavior/Discharge Planning: Goal: Ability to manage health-related needs will improve Outcome: Progressing Goal: Goals will be collaboratively established with patient/family Outcome: Progressing   Problem: Self-Care: Goal: Ability to participate in self-care as condition  permits will improve Outcome: Progressing Goal: Verbalization of feelings and concerns over difficulty with self-care will improve Outcome: Progressing Goal: Ability to communicate needs accurately will improve Outcome: Progressing   Problem: Nutrition: Goal: Risk of aspiration will decrease Outcome: Progressing Goal: Dietary intake will improve Outcome: Progressing

## 2024-05-10 NOTE — Progress Notes (Signed)
 PROGRESS NOTE  Garrett [name] FMW:[id number] DOB: 1975-10-30   PCP: Pcp, No  Patient is from: Home  DOA: 10/27/2023 LOS: 196  Chief complaints Chief Complaint  Patient presents with   Unresponsive     Brief Narrative / Interim history:  48 y/o male with past medical history of hypertension was found down at home agonal breathing. History of uncontrolled hypertension but never had been to the doctor. Patient was noted to have a large (4.1 cm) hemorrhage centered in the pons with intraventricular extension into the fourth ventricle and also extension into the basal cisterns and trace additional subarachnoid hemorrhage along the left parietal convexity. BP in ED was 262/156. CXR revealed RUL and perihilar infiltrates suggesting aspiration pneumonia. ED labs also revealed PH 7.169, LA 4.4, CPK 985. Patient was intubated in ED and was admitted hospital.    He had prolonged complicated hospital stay pontine hemorrhage, biparietal SAH.  Patient's mental status poor, s/p tracheostomy and PEG placement.  Sputum cultures positive for MRSA on contact precautions.  Patient had prolonged difficulty weaning from vent.  Patient is on trach collar for few days but later required ventilatory support mostly at nights and is currently in ICU.  Subjective: Seen and examined earlier this afternoon.  Mild fever to 100.7 and 100.6 this morning. Slightly tachypneic   Objective: Vitals:   05/10/24 1200 05/10/24 1300 05/10/24 1400 05/10/24 1459  BP: 121/83 124/82 125/86   Pulse: 96 97 99 (!) 103  Resp: (!) 33 (!) 26 (!) 27 (!) 21  Temp:    99.2 F (37.3 C)  TempSrc:    Axillary  SpO2: 91% 96% 95% 92%  Weight:      Height:        Examination:  GENERAL: No apparent distress.  HEENT: MMM.  Left eye conjunctival injection and corneal clouding. NECK: Tracheostomy in place. RESP:  No IWOB.  Fair aeration bilaterally. CVS:  RRR. Heart sounds normal.  ABD/GI/GU: BS+. Abd soft, NTND.  G-tube in  place. MSK/EXT: No apparent deformity but does not move extremities. NEURO: Does not response to verbal or tactile stimuli.  Blinks eyes.  Tracking?  Does not move extremities. PSYCH: No distress or agitation.   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 -Continue supportive care-trach and vent and tube feed.  -On and off tachycardia, tachypnea noted. -Continue amlodipine , Lopressor  and as needed hydralazine . -Mental status continue to be poor.   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  Not a candidate for decannulation given ineffective airway clearance, -Continue trach collar during day with vent support at night. On and off tachypnea persists. -Continue trach care per protocol. -Pulmonary follow up appreciated.    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 Waylan evaluated and placed tarsorraphy.   -8/29-ophtho rec: Erythromycin  eyedrop q2h, tape eyes with paper tape if not blinking. -9/14, ophtho, Dr. Leane reengaged due to worsening conjunctivitis and clouding I recommended second antibiotic, gatifloxacin  4 times daily.per Optho, no epithelial defects noted.   SIRS: Mild fever with tachypnea.  Unclear source of infection but multiple risk factors tracheostomy, Foley catheter.  Has no central line. - Portable CXR, aspirate culture, CBC with differential, CMP, blood culture - If no answer from the above, will replace Foley and send urine sample.  Inadequate oral intake Body mass index is 30.84 kg/m. Nutrition Problem: Inadequate oral intake Etiology: inability to eat Signs/Symptoms: NPO status Interventions: Tube  feeding, Prostat, MVI, Juven   DVT prophylaxis:  enoxaparin  (LOVENOX ) injection 40 mg Start: 03/06/24 1000 SCDs Start: 10/27/23 2226  Code Status: Full code Family Communication: None at bedside Level  of care: ICU Status is: Inpatient Remains inpatient appropriate because: Intracranial hemorrhage, vegetative state   Final disposition: Unclear   35 minutes with more than 50% spent in reviewing records, counseling patient/family and coordinating care.   Sch Meds:  Scheduled Meds:  amLODipine   10 mg Per Tube Daily   Chlorhexidine  Gluconate Cloth  6 each Topical Q0600   enoxaparin  (LOVENOX ) injection  40 mg Subcutaneous Daily   erythromycin    Both Eyes Q2H   famotidine   20 mg Per Tube BID   feeding supplement (JEVITY 1.5 CAL/FIBER)  237 mL Per Tube 5 X Daily   feeding supplement (PROSource TF20)  60 mL Per Tube TID   free water   100 mL Per Tube Q6H   gatifloxacin   1 drop Both Eyes QID   insulin  aspart  0-9 Units Subcutaneous Q6H   metoprolol  tartrate  75 mg Per Tube BID   multivitamin with minerals  1 tablet Per Tube Daily   mouth rinse  15 mL Mouth Rinse Q2H   polyethylene glycol  17 g Per Tube Daily   Continuous Infusions: PRN Meds:.acetaminophen  **OR** acetaminophen  (TYLENOL ) oral liquid 160 mg/5 mL **OR** acetaminophen , bisacodyl , Gerhardt's butt cream, hydrALAZINE , ipratropium-albuterol , mouth rinse, traZODone   Antimicrobials: Anti-infectives (From admission, onward)    Start     Dose/Rate Route Frequency Ordered Stop   04/09/24 2200  linezolid  (ZYVOX ) tablet 600 mg  Status:  Discontinued        600 mg Per Tube Every 12 hours 04/09/24 1132 04/13/24 1332   04/09/24 1700  vancomycin  (VANCOREADY) IVPB 1500 mg/300 mL  Status:  Discontinued        1,500 mg 150 mL/hr over 120 Minutes Intravenous Every 12 hours 04/09/24 1108 04/09/24 1132   04/09/24 1000  linezolid  (ZYVOX ) IVPB 600 mg  Status:  Discontinued        600 mg 300 mL/hr over 60 Minutes Intravenous Every 12 hours 04/09/24 0527 04/09/24 1108   04/01/24 1145  ceFEPIme  (MAXIPIME ) 2 g in sodium chloride  0.9 % 100 mL IVPB        2 g 200 mL/hr over 30 Minutes Intravenous Every 8 hours 04/01/24 1137 04/14/24 2230    02/21/24 1000  vancomycin  (VANCOREADY) IVPB 1250 mg/250 mL        1,250 mg 166.7 mL/hr over 90 Minutes Intravenous Every 12 hours 02/20/24 2158 02/28/24 0130   02/16/24 1445  ceFEPIme  (MAXIPIME ) 2 g in sodium chloride  0.9 % 100 mL IVPB        2 g 200 mL/hr over 30 Minutes Intravenous Every 8 hours 02/16/24 1348 02/27/24 2322   02/16/24 0945  vancomycin  (VANCOREADY) IVPB 1500 mg/300 mL  Status:  Discontinued        1,500 mg 150 mL/hr over 120 Minutes Intravenous Every 12 hours 02/16/24 0848 02/20/24 2158   02/15/24 1215  Ampicillin -Sulbactam (UNASYN ) 3 g in sodium chloride  0.9 % 100 mL IVPB  Status:  Discontinued        3 g 200 mL/hr over 30 Minutes Intravenous Every 6 hours 02/15/24 1125 02/16/24 1226   01/24/24 2000  vancomycin  (VANCOREADY) IVPB 1250 mg/250 mL        1,250 mg 166.7 mL/hr over 90 Minutes Intravenous Every 24 hours 01/24/24 1154 01/27/24 0125   01/22/24 2200  Vancomycin  (VANCOCIN ) 1,250 mg  in sodium chloride  0.9 % 250 mL IVPB  Status:  Discontinued        1,250 mg 166.7 mL/hr over 90 Minutes Intravenous Every 24 hours 01/22/24 1013 01/22/24 1020   01/22/24 2000  vancomycin  (VANCOREADY) IVPB 1250 mg/250 mL  Status:  Discontinued        1,250 mg 166.7 mL/hr over 90 Minutes Intravenous Every 24 hours 01/22/24 1020 01/24/24 1154   01/20/24 2000  ceFEPIme  (MAXIPIME ) 2 g in sodium chloride  0.9 % 100 mL IVPB  Status:  Discontinued        2 g 200 mL/hr over 30 Minutes Intravenous Every 8 hours 01/20/24 1900 01/24/24 1154   01/20/24 2000  Vancomycin  (VANCOCIN ) 1,250 mg in sodium chloride  0.9 % 250 mL IVPB  Status:  Discontinued        1,250 mg 166.7 mL/hr over 90 Minutes Intravenous Every 24 hours 01/20/24 1900 01/22/24 1013   01/03/24 1730  cefTRIAXone  (ROCEPHIN ) 1 g in sodium chloride  0.9 % 100 mL IVPB        1 g 200 mL/hr over 30 Minutes Intravenous Every 24 hours 01/03/24 1235 01/07/24 0710   01/02/24 1830  ceFEPIme  (MAXIPIME ) 2 g in sodium chloride  0.9 % 100 mL IVPB  Status:   Discontinued        2 g 200 mL/hr over 30 Minutes Intravenous Every 8 hours 01/02/24 1732 01/03/24 1235   01/02/24 1830  Vancomycin  (VANCOCIN ) 1,250 mg in sodium chloride  0.9 % 250 mL IVPB  Status:  Discontinued        1,250 mg 166.7 mL/hr over 90 Minutes Intravenous Every 24 hours 01/02/24 1732 01/04/24 1754   01/01/24 1200  cefTRIAXone  (ROCEPHIN ) 1 g in sodium chloride  0.9 % 100 mL IVPB  Status:  Discontinued        1 g 200 mL/hr over 30 Minutes Intravenous Every 24 hours 01/01/24 1057 01/02/24 1713   11/29/23 2200  linezolid  (ZYVOX ) tablet 600 mg        600 mg Per Tube Every 12 hours 11/29/23 1034 12/04/23 2215   11/28/23 1000  linezolid  (ZYVOX ) IVPB 600 mg  Status:  Discontinued        600 mg 300 mL/hr over 60 Minutes Intravenous Every 12 hours 11/28/23 0831 11/29/23 1034   11/23/23 1200  vancomycin  (VANCOREADY) IVPB 1250 mg/250 mL  Status:  Discontinued        1,250 mg 166.7 mL/hr over 90 Minutes Intravenous Every 24 hours 11/23/23 1036 11/28/23 0831   11/20/23 1100  vancomycin  (VANCOREADY) IVPB 1250 mg/250 mL  Status:  Discontinued        1,250 mg 166.7 mL/hr over 90 Minutes Intravenous Every 24 hours 11/20/23 0923 11/20/23 0923   11/20/23 1100  vancomycin  (VANCOREADY) IVPB 1250 mg/250 mL  Status:  Discontinued        1,250 mg 166.7 mL/hr over 90 Minutes Intravenous Every 24 hours 11/20/23 0923 11/23/23 1036   11/15/23 2300  vancomycin  (VANCOREADY) IVPB 1250 mg/250 mL        1,250 mg 166.7 mL/hr over 90 Minutes Intravenous Every 24 hours 11/15/23 2215 11/19/23 0924   11/12/23 2200  vancomycin  (VANCOREADY) IVPB 750 mg/150 mL  Status:  Discontinued        750 mg 150 mL/hr over 60 Minutes Intravenous Every 12 hours 11/12/23 0855 11/15/23 2215   11/12/23 0930  vancomycin  (VANCOREADY) IVPB 2000 mg/400 mL        2,000 mg 200 mL/hr over 120 Minutes Intravenous  Once 11/12/23  9161 11/12/23 1116   11/11/23 1345  Ampicillin -Sulbactam (UNASYN ) 3 g in sodium chloride  0.9 % 100 mL IVPB   Status:  Discontinued        3 g 200 mL/hr over 30 Minutes Intravenous Every 6 hours 11/11/23 1257 11/12/23 0836   10/28/23 0400  Ampicillin -Sulbactam (UNASYN ) 3 g in sodium chloride  0.9 % 100 mL IVPB  Status:  Discontinued        3 g 200 mL/hr over 30 Minutes Intravenous Every 6 hours 10/27/23 2151 11/02/23 0945   10/27/23 1845  vancomycin  (VANCOCIN ) IVPB 1000 mg/200 mL premix  Status:  Discontinued        1,000 mg 200 mL/hr over 60 Minutes Intravenous  Once 10/27/23 1843 10/27/23 1843   10/27/23 1845  ceFEPIme  (MAXIPIME ) 2 g in sodium chloride  0.9 % 100 mL IVPB        2 g 200 mL/hr over 30 Minutes Intravenous  Once 10/27/23 1843 10/27/23 2024   10/27/23 1845  vancomycin  (VANCOREADY) IVPB 2000 mg/400 mL        2,000 mg 200 mL/hr over 120 Minutes Intravenous  Once 10/27/23 1843 10/27/23 2123        I have personally reviewed the following labs and images: CBC: Recent Labs  Lab 05/05/24 0908 05/05/24 1010  WBC  --  9.5  HGB 11.2* 10.9*  HCT 33.0* 37.2*  MCV  --  92.3  PLT  --  363   BMP &GFR Recent Labs  Lab 05/05/24 0908  NA 148*  K 3.8   Estimated Creatinine Clearance: 125.6 mL/min (A) (by C-G formula based on SCr of 0.52 mg/dL (L)). Liver & Pancreas: No results for input(s): AST, ALT, ALKPHOS, BILITOT, PROT, ALBUMIN  in the last 168 hours. No results for input(s): LIPASE, AMYLASE in the last 168 hours. No results for input(s): AMMONIA in the last 168 hours. Diabetic: No results for input(s): HGBA1C in the last 72 hours. Recent Labs  Lab 05/08/24 1728 05/08/24 2333 05/09/24 0614 05/09/24 1317 05/09/24 2320  GLUCAP 106* 105* 118* 98 139*   Cardiac Enzymes: No results for input(s): CKTOTAL, CKMB, CKMBINDEX, TROPONINI in the last 168 hours. No results for input(s): PROBNP in the last 8760 hours. Coagulation Profile: No results for input(s): INR, PROTIME in the last 168 hours. Thyroid Function Tests: No results for input(s):  TSH, T4TOTAL, FREET4, T3FREE, THYROIDAB in the last 72 hours. Lipid Profile: No results for input(s): CHOL, HDL, LDLCALC, TRIG, CHOLHDL, LDLDIRECT in the last 72 hours. Anemia Panel: No results for input(s): VITAMINB12, FOLATE, FERRITIN, TIBC, IRON, RETICCTPCT in the last 72 hours. Urine analysis:    Component Value Date/Time   COLORURINE YELLOW 11/07/2023 0930   APPEARANCEUR CLEAR 11/07/2023 0930   LABSPEC 1.017 11/07/2023 0930   PHURINE 5.0 11/07/2023 0930   GLUCOSEU NEGATIVE 11/07/2023 0930   HGBUR NEGATIVE 11/07/2023 0930   BILIRUBINUR NEGATIVE 11/07/2023 0930   KETONESUR NEGATIVE 11/07/2023 0930   PROTEINUR NEGATIVE 11/07/2023 0930   NITRITE NEGATIVE 11/07/2023 0930   LEUKOCYTESUR NEGATIVE 11/07/2023 0930   Sepsis Labs: Invalid input(s): PROCALCITONIN, LACTICIDVEN  Microbiology: No results found for this or any previous visit (from the past 240 hours).  Radiology Studies: No results found.    Llewyn Heap T. Eathen Budreau [facility]  If 7PM-7AM, please contact night-coverage [email or web address] 05/10/2024, 3:04 PM

## 2024-05-10 NOTE — Plan of Care (Signed)
  Problem: Nutrition: Goal: Risk of aspiration will decrease Outcome: Progressing   Problem: Fluid Volume: Goal: Ability to maintain a balanced intake and output will improve Outcome: Progressing   Problem: Skin Integrity: Goal: Risk for impaired skin integrity will decrease Outcome: Progressing   Problem: Tissue Perfusion: Goal: Adequacy of tissue perfusion will improve Outcome: Progressing

## 2024-05-11 DIAGNOSIS — L8990 Pressure ulcer of unspecified site, unspecified stage: Secondary | ICD-10-CM | POA: Diagnosis not present

## 2024-05-11 DIAGNOSIS — Z789 Other specified health status: Secondary | ICD-10-CM | POA: Diagnosis not present

## 2024-05-11 DIAGNOSIS — J9601 Acute respiratory failure with hypoxia: Secondary | ICD-10-CM | POA: Diagnosis not present

## 2024-05-11 DIAGNOSIS — Z930 Tracheostomy status: Secondary | ICD-10-CM | POA: Diagnosis not present

## 2024-05-11 DIAGNOSIS — I619 Nontraumatic intracerebral hemorrhage, unspecified: Secondary | ICD-10-CM | POA: Diagnosis not present

## 2024-05-11 DIAGNOSIS — J9611 Chronic respiratory failure with hypoxia: Secondary | ICD-10-CM | POA: Diagnosis not present

## 2024-05-11 LAB — CBC
HCT: 34.3 % — ABNORMAL LOW (ref 39.0–52.0)
Hemoglobin: 10 g/dL — ABNORMAL LOW (ref 13.0–17.0)
MCH: 27.2 pg (ref 26.0–34.0)
MCHC: 29.2 g/dL — ABNORMAL LOW (ref 30.0–36.0)
MCV: 93.5 fL (ref 80.0–100.0)
Platelets: 367 K/uL (ref 150–400)
RBC: 3.67 MIL/uL — ABNORMAL LOW (ref 4.22–5.81)
RDW: 16 % — ABNORMAL HIGH (ref 11.5–15.5)
WBC: 9.9 K/uL (ref 4.0–10.5)
nRBC: 0 % (ref 0.0–0.2)

## 2024-05-11 LAB — GLUCOSE, CAPILLARY
Glucose-Capillary: 144 mg/dL — ABNORMAL HIGH (ref 70–99)
Glucose-Capillary: 149 mg/dL — ABNORMAL HIGH (ref 70–99)

## 2024-05-11 LAB — COMPREHENSIVE METABOLIC PANEL WITH GFR
ALT: 109 U/L — ABNORMAL HIGH (ref 0–44)
AST: 53 U/L — ABNORMAL HIGH (ref 15–41)
Albumin: 2.9 g/dL — ABNORMAL LOW (ref 3.5–5.0)
Alkaline Phosphatase: 61 U/L (ref 38–126)
Anion gap: 10 (ref 5–15)
BUN: 30 mg/dL — ABNORMAL HIGH (ref 6–20)
CO2: 29 mmol/L (ref 22–32)
Calcium: 9.2 mg/dL (ref 8.9–10.3)
Chloride: 109 mmol/L (ref 98–111)
Creatinine, Ser: 0.49 mg/dL — ABNORMAL LOW (ref 0.61–1.24)
GFR, Estimated: >60 mL/min (ref >60)
Glucose, Bld: 115 mg/dL — ABNORMAL HIGH (ref 70–99)
Potassium: 4.2 mmol/L (ref 3.5–5.1)
Sodium: 148 mmol/L — ABNORMAL HIGH (ref 135–145)
Total Bilirubin: 0.4 mg/dL (ref 0.0–1.2)
Total Protein: 7.5 g/dL (ref 6.5–8.1)

## 2024-05-11 LAB — PHOSPHORUS: Phosphorus: 4 mg/dL (ref 2.5–4.6)

## 2024-05-11 LAB — MAGNESIUM: Magnesium: 2.3 mg/dL (ref 1.7–2.4)

## 2024-05-11 MED ORDER — CHLORHEXIDINE GLUCONATE CLOTH 2 % EX PADS
6.0000 | MEDICATED_PAD | Freq: Every day | CUTANEOUS | Status: DC
  Administered 2024-05-11 – 2024-06-20 (×39): 6 via TOPICAL

## 2024-05-11 NOTE — Plan of Care (Signed)
  Problem: Intracerebral Hemorrhage Tissue Perfusion: Goal: Complications of Intracerebral Hemorrhage will be minimized Outcome: Progressing   Problem: Nutrition: Goal: Risk of aspiration will decrease Outcome: Progressing Goal: Dietary intake will improve Outcome: Progressing

## 2024-05-11 NOTE — Plan of Care (Signed)
  Problem: Nutrition: Goal: Risk of aspiration will decrease Outcome: Progressing   Problem: Fluid Volume: Goal: Ability to maintain a balanced intake and output will improve Outcome: Progressing   Problem: Pain Managment: Goal: General experience of comfort will improve and/or be controlled Outcome: Progressing   Problem: Safety: Goal: Ability to remain free from injury will improve Outcome: Progressing

## 2024-05-11 NOTE — Plan of Care (Signed)
  Problem: Intracerebral Hemorrhage Tissue Perfusion: Goal: Complications of Intracerebral Hemorrhage will be minimized Outcome: Progressing   Problem: Nutrition: Goal: Risk of aspiration will decrease Outcome: Progressing Goal: Dietary intake will improve Outcome: Progressing   Problem: Fluid Volume: Goal: Ability to maintain a balanced intake and output will improve Outcome: Progressing   Problem: Skin Integrity: Goal: Risk for impaired skin integrity will decrease Outcome: Progressing   Problem: Tissue Perfusion: Goal: Adequacy of tissue perfusion will improve Outcome: Progressing   Problem: Clinical Measurements: Goal: Ability to maintain clinical measurements within normal limits will improve Outcome: Progressing Goal: Will remain free from infection Outcome: Progressing Goal: Diagnostic test results will improve Outcome: Progressing Goal: Respiratory complications will improve Outcome: Progressing Goal: Cardiovascular complication will be avoided Outcome: Progressing   Problem: Activity: Goal: Risk for activity intolerance will decrease Outcome: Progressing   Problem: Nutrition: Goal: Adequate nutrition will be maintained Outcome: Progressing   Problem: Elimination: Goal: Will not experience complications related to bowel motility Outcome: Progressing Goal: Will not experience complications related to urinary retention Outcome: Progressing   Problem: Pain Managment: Goal: General experience of comfort will improve and/or be controlled Outcome: Progressing   Problem: Safety: Goal: Ability to remain free from injury will improve Outcome: Progressing   Problem: Skin Integrity: Goal: Risk for impaired skin integrity will decrease Outcome: Progressing   Problem: Activity: Goal: Ability to tolerate increased activity will improve Outcome: Progressing   Problem: Respiratory: Goal: Patent airway maintenance will improve Outcome: Progressing   Problem:  Activity: Goal: Ability to tolerate increased activity will improve Outcome: Progressing   Problem: Respiratory: Goal: Ability to maintain a clear airway and adequate ventilation will improve Outcome: Progressing   Problem: Role Relationship: Goal: Method of communication will improve Outcome: Progressing   Problem: Education: Goal: Knowledge of disease or condition will improve Outcome: Progressing   Problem: Intracerebral Hemorrhage Tissue Perfusion: Goal: Complications of Intracerebral Hemorrhage will be minimized Outcome: Progressing   Problem: Self-Care: Goal: Ability to participate in self-care as condition permits will improve Outcome: Progressing Goal: Verbalization of feelings and concerns over difficulty with self-care will improve Outcome: Progressing Goal: Ability to communicate needs accurately will improve Outcome: Progressing   Problem: Nutrition: Goal: Risk of aspiration will decrease Outcome: Progressing Goal: Dietary intake will improve Outcome: Progressing   Problem: Education: Goal: Knowledge of disease or condition will improve Outcome: Progressing Goal: Knowledge of secondary prevention will improve (MUST DOCUMENT ALL) Outcome: Progressing Goal: Knowledge of patient specific risk factors will improve (DELETE if not current risk factor) Outcome: Progressing   Problem: Intracerebral Hemorrhage Tissue Perfusion: Goal: Complications of Intracerebral Hemorrhage will be minimized Outcome: Progressing   Problem: Coping: Goal: Will verbalize positive feelings about self Outcome: Progressing Goal: Will identify appropriate support needs Outcome: Progressing   Problem: Health Behavior/Discharge Planning: Goal: Ability to manage health-related needs will improve Outcome: Progressing Goal: Goals will be collaboratively established with patient/family Outcome: Progressing   Problem: Self-Care: Goal: Ability to participate in self-care as condition  permits will improve Outcome: Progressing Goal: Verbalization of feelings and concerns over difficulty with self-care will improve Outcome: Progressing Goal: Ability to communicate needs accurately will improve Outcome: Progressing   Problem: Nutrition: Goal: Risk of aspiration will decrease Outcome: Progressing Goal: Dietary intake will improve Outcome: Progressing

## 2024-05-11 NOTE — Progress Notes (Addendum)
 PROGRESS NOTE  Garrett [name] FMW:[id number] DOB: 08-Feb-1976   PCP: Pcp, No  Patient is from: Home  DOA: 10/27/2023 LOS: 197  Chief complaints Chief Complaint  Patient presents with   Unresponsive     Brief Narrative / Interim history:  48 y/o male with past medical history of hypertension was found down at home agonal breathing. History of uncontrolled hypertension but never had been to the doctor. Patient was noted to have a large (4.1 cm) hemorrhage centered in the pons with intraventricular extension into the fourth ventricle and also extension into the basal cisterns and trace additional subarachnoid hemorrhage along the left parietal convexity. BP in ED was 262/156. CXR revealed RUL and perihilar infiltrates suggesting aspiration pneumonia. ED labs also revealed PH 7.169, LA 4.4, CPK 985. Patient was intubated in ED and was admitted hospital.    Patient had prolonged complicated hospital stay pontine hemorrhage, biparietal SAH.  Patient's mental status poor, s/p tracheostomy and PEG placement.  Sputum cultures positive for MRSA on contact precautions.  Patient had prolonged difficulty weaning from vent.  Patient is on trach collar for few days but later required ventilatory support mostly at nights and is currently in ICU.  Subjective: Seen and examined earlier this afternoon.  No further fever.  SIRS resolved.  Blood cultures NGTD.  Trach aspirate culture with abundant GNR's.  CXR without significant finding.  Objective: Vitals:   05/11/24 0800 05/11/24 0810 05/11/24 0900 05/11/24 1104  BP: 112/87  120/84 116/88  Pulse: 85 85 91 88  Resp: 13 (!) 22 17 17   Temp:      TempSrc:      SpO2: 97% 99% 92% 97%  Weight:      Height:        Examination:  GENERAL: No apparent distress.  HEENT: MMM.  Left eye conjunctival injection and corneal clouding. NECK: Tracheostomy in place. RESP:  No IWOB.  Fair aeration bilaterally. CVS:  RRR. Heart sounds normal.  ABD/GI/GU: BS+.  Abd soft, NTND.  G-tube in place. MSK/EXT: No apparent deformity but does not move extremities. NEURO: Does not response to verbal or tactile stimuli.  Blinks eyes.  Tracking?  Does not move extremities. PSYCH: No distress or agitation.   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 -Continue supportive care-trach and vent and tube feed.  -On and off tachycardia, tachypnea noted. -Continue amlodipine , Lopressor  and as needed hydralazine . -Mental status continue to be poor.   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  Not a candidate for decannulation given ineffective airway clearance, -Continue trach collar during day with vent support at night. On and off tachypnea persists. -Continue trach care per protocol. -Pulmonary follow up appreciated.    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 Waylan evaluated and placed tarsorraphy.   -8/29-ophtho rec: Erythromycin  eyedrop q2h, tape eyes with paper tape if not blinking. -9/14, ophtho, Dr. Leane reengaged due to worsening conjunctivitis and clouding I recommended second antibiotic, gatifloxacin  4 times daily.per Optho, no epithelial defects noted.   SIRS: Blood cultures NGTD.  CXR unrevealing.  No leukocytosis.  Resp culture with GNR.  SIRS resolved without antibiotics. - Follow respiratory culture - Hold off antibiotics  Hypernatremia: Mild - Increase free water  from 100 cc to 200 cc every 6 hours - Recheck in the morning  Elevated liver enzymes: Mild.  Unclear etiology. - Check CK in the morning  Inadequate oral intake  Body mass index is 30.84 kg/m. Nutrition Problem: Inadequate oral intake Etiology: inability to eat Signs/Symptoms: NPO status Interventions: Tube feeding, Prostat, MVI, Juven   DVT prophylaxis:  enoxaparin  (LOVENOX ) injection 40 mg Start: 03/06/24  1000 SCDs Start: 10/27/23 2226  Code Status: Full code Family Communication: None at bedside Level of care: ICU Status is: Inpatient Remains inpatient appropriate because: Intracranial hemorrhage, vegetative state   Final disposition: Unclear   35 minutes with more than 50% spent in reviewing records, counseling patient/family and coordinating care.   Sch Meds:  Scheduled Meds:  amLODipine   10 mg Per Tube Daily   Chlorhexidine  Gluconate Cloth  6 each Topical Daily   enoxaparin  (LOVENOX ) injection  40 mg Subcutaneous Daily   erythromycin    Both Eyes Q2H   famotidine   20 mg Per Tube BID   feeding supplement (JEVITY 1.5 CAL/FIBER)  237 mL Per Tube 5 X Daily   feeding supplement (PROSource TF20)  60 mL Per Tube TID   free water   200 mL Per Tube Q6H   gatifloxacin   1 drop Both Eyes QID   insulin  aspart  0-9 Units Subcutaneous Q6H   metoprolol  tartrate  75 mg Per Tube BID   multivitamin with minerals  1 tablet Per Tube Daily   mouth rinse  15 mL Mouth Rinse Q2H   polyethylene glycol  17 g Per Tube Daily   Continuous Infusions: PRN Meds:.acetaminophen  **OR** acetaminophen  (TYLENOL ) oral liquid 160 mg/5 mL **OR** acetaminophen , bisacodyl , Gerhardt's butt cream, hydrALAZINE , ipratropium-albuterol , mouth rinse, traZODone   Antimicrobials: Anti-infectives (From admission, onward)    Start     Dose/Rate Route Frequency Ordered Stop   04/09/24 2200  linezolid  (ZYVOX ) tablet 600 mg  Status:  Discontinued        600 mg Per Tube Every 12 hours 04/09/24 1132 04/13/24 1332   04/09/24 1700  vancomycin  (VANCOREADY) IVPB 1500 mg/300 mL  Status:  Discontinued        1,500 mg 150 mL/hr over 120 Minutes Intravenous Every 12 hours 04/09/24 1108 04/09/24 1132   04/09/24 1000  linezolid  (ZYVOX ) IVPB 600 mg  Status:  Discontinued        600 mg 300 mL/hr over 60 Minutes Intravenous Every 12 hours 04/09/24 0527 04/09/24 1108   04/01/24 1145  ceFEPIme  (MAXIPIME ) 2 g in sodium chloride  0.9 % 100 mL  IVPB        2 g 200 mL/hr over 30 Minutes Intravenous Every 8 hours 04/01/24 1137 04/14/24 2230   02/21/24 1000  vancomycin  (VANCOREADY) IVPB 1250 mg/250 mL        1,250 mg 166.7 mL/hr over 90 Minutes Intravenous Every 12 hours 02/20/24 2158 02/28/24 0130   02/16/24 1445  ceFEPIme  (MAXIPIME ) 2 g in sodium chloride  0.9 % 100 mL IVPB        2 g 200 mL/hr over 30 Minutes Intravenous Every 8 hours 02/16/24 1348 02/27/24 2322   02/16/24 0945  vancomycin  (VANCOREADY) IVPB 1500 mg/300 mL  Status:  Discontinued        1,500 mg 150 mL/hr over 120 Minutes Intravenous Every 12 hours 02/16/24 0848 02/20/24 2158   02/15/24 1215  Ampicillin -Sulbactam (UNASYN ) 3 g in sodium chloride  0.9 % 100 mL IVPB  Status:  Discontinued        3 g 200 mL/hr over 30 Minutes Intravenous Every 6 hours 02/15/24 1125 02/16/24 1226   01/24/24 2000  vancomycin  (VANCOREADY) IVPB 1250 mg/250 mL        1,250 mg 166.7 mL/hr  over 90 Minutes Intravenous Every 24 hours 01/24/24 1154 01/27/24 0125   01/22/24 2200  Vancomycin  (VANCOCIN ) 1,250 mg in sodium chloride  0.9 % 250 mL IVPB  Status:  Discontinued        1,250 mg 166.7 mL/hr over 90 Minutes Intravenous Every 24 hours 01/22/24 1013 01/22/24 1020   01/22/24 2000  vancomycin  (VANCOREADY) IVPB 1250 mg/250 mL  Status:  Discontinued        1,250 mg 166.7 mL/hr over 90 Minutes Intravenous Every 24 hours 01/22/24 1020 01/24/24 1154   01/20/24 2000  ceFEPIme  (MAXIPIME ) 2 g in sodium chloride  0.9 % 100 mL IVPB  Status:  Discontinued        2 g 200 mL/hr over 30 Minutes Intravenous Every 8 hours 01/20/24 1900 01/24/24 1154   01/20/24 2000  Vancomycin  (VANCOCIN ) 1,250 mg in sodium chloride  0.9 % 250 mL IVPB  Status:  Discontinued        1,250 mg 166.7 mL/hr over 90 Minutes Intravenous Every 24 hours 01/20/24 1900 01/22/24 1013   01/03/24 1730  cefTRIAXone  (ROCEPHIN ) 1 g in sodium chloride  0.9 % 100 mL IVPB        1 g 200 mL/hr over 30 Minutes Intravenous Every 24 hours 01/03/24 1235  01/07/24 0710   01/02/24 1830  ceFEPIme  (MAXIPIME ) 2 g in sodium chloride  0.9 % 100 mL IVPB  Status:  Discontinued        2 g 200 mL/hr over 30 Minutes Intravenous Every 8 hours 01/02/24 1732 01/03/24 1235   01/02/24 1830  Vancomycin  (VANCOCIN ) 1,250 mg in sodium chloride  0.9 % 250 mL IVPB  Status:  Discontinued        1,250 mg 166.7 mL/hr over 90 Minutes Intravenous Every 24 hours 01/02/24 1732 01/04/24 1754   01/01/24 1200  cefTRIAXone  (ROCEPHIN ) 1 g in sodium chloride  0.9 % 100 mL IVPB  Status:  Discontinued        1 g 200 mL/hr over 30 Minutes Intravenous Every 24 hours 01/01/24 1057 01/02/24 1713   11/29/23 2200  linezolid  (ZYVOX ) tablet 600 mg        600 mg Per Tube Every 12 hours 11/29/23 1034 12/04/23 2215   11/28/23 1000  linezolid  (ZYVOX ) IVPB 600 mg  Status:  Discontinued        600 mg 300 mL/hr over 60 Minutes Intravenous Every 12 hours 11/28/23 0831 11/29/23 1034   11/23/23 1200  vancomycin  (VANCOREADY) IVPB 1250 mg/250 mL  Status:  Discontinued        1,250 mg 166.7 mL/hr over 90 Minutes Intravenous Every 24 hours 11/23/23 1036 11/28/23 0831   11/20/23 1100  vancomycin  (VANCOREADY) IVPB 1250 mg/250 mL  Status:  Discontinued        1,250 mg 166.7 mL/hr over 90 Minutes Intravenous Every 24 hours 11/20/23 0923 11/20/23 0923   11/20/23 1100  vancomycin  (VANCOREADY) IVPB 1250 mg/250 mL  Status:  Discontinued        1,250 mg 166.7 mL/hr over 90 Minutes Intravenous Every 24 hours 11/20/23 0923 11/23/23 1036   11/15/23 2300  vancomycin  (VANCOREADY) IVPB 1250 mg/250 mL        1,250 mg 166.7 mL/hr over 90 Minutes Intravenous Every 24 hours 11/15/23 2215 11/19/23 0924   11/12/23 2200  vancomycin  (VANCOREADY) IVPB 750 mg/150 mL  Status:  Discontinued        750 mg 150 mL/hr over 60 Minutes Intravenous Every 12 hours 11/12/23 0855 11/15/23 2215   11/12/23 0930  vancomycin  (VANCOREADY) IVPB 2000  mg/400 mL        2,000 mg 200 mL/hr over 120 Minutes Intravenous  Once 11/12/23 0838  11/12/23 1116   11/11/23 1345  Ampicillin -Sulbactam (UNASYN ) 3 g in sodium chloride  0.9 % 100 mL IVPB  Status:  Discontinued        3 g 200 mL/hr over 30 Minutes Intravenous Every 6 hours 11/11/23 1257 11/12/23 0836   10/28/23 0400  Ampicillin -Sulbactam (UNASYN ) 3 g in sodium chloride  0.9 % 100 mL IVPB  Status:  Discontinued        3 g 200 mL/hr over 30 Minutes Intravenous Every 6 hours 10/27/23 2151 11/02/23 0945   10/27/23 1845  vancomycin  (VANCOCIN ) IVPB 1000 mg/200 mL premix  Status:  Discontinued        1,000 mg 200 mL/hr over 60 Minutes Intravenous  Once 10/27/23 1843 10/27/23 1843   10/27/23 1845  ceFEPIme  (MAXIPIME ) 2 g in sodium chloride  0.9 % 100 mL IVPB        2 g 200 mL/hr over 30 Minutes Intravenous  Once 10/27/23 1843 10/27/23 2024   10/27/23 1845  vancomycin  (VANCOREADY) IVPB 2000 mg/400 mL        2,000 mg 200 mL/hr over 120 Minutes Intravenous  Once 10/27/23 1843 10/27/23 2123        I have personally reviewed the following labs and images: CBC: Recent Labs  Lab 05/05/24 0908 05/05/24 1010 05/10/24 1652 05/11/24 0205  WBC  --  9.5 10.5 9.9  NEUTROABS  --   --  7.7  --   HGB 11.2* 10.9* 11.1* 10.0*  HCT 33.0* 37.2* 38.1* 34.3*  MCV  --  92.3 92.0 93.5  PLT  --  363 404* 367   BMP &GFR Recent Labs  Lab 05/05/24 0908 05/10/24 1652 05/11/24 0205  NA 148* 148* 148*  K 3.8 4.1 4.2  CL  --  108 109  CO2  --  27 29  GLUCOSE  --  129* 115*  BUN  --  27* 30*  CREATININE  --  0.46* 0.49*  CALCIUM  --  9.3 9.2  MG  --  2.3 2.3  PHOS  --   --  4.0   Estimated Creatinine Clearance: 125.6 mL/min (A) (by C-G formula based on SCr of 0.49 mg/dL (L)). Liver & Pancreas: Recent Labs  Lab 05/10/24 1652 05/11/24 0205  AST 56* 53*  ALT 107* 109*  ALKPHOS 68 61  BILITOT 0.4 0.4  PROT 8.5* 7.5  ALBUMIN  3.3* 2.9*   No results for input(s): LIPASE, AMYLASE in the last 168 hours. No results for input(s): AMMONIA in the last 168 hours. Diabetic: No results  for input(s): HGBA1C in the last 72 hours. Recent Labs  Lab 05/08/24 2333 05/09/24 0614 05/09/24 1317 05/09/24 2320 05/10/24 2317  GLUCAP 105* 118* 98 139* 129*   Cardiac Enzymes: No results for input(s): CKTOTAL, CKMB, CKMBINDEX, TROPONINI in the last 168 hours. No results for input(s): PROBNP in the last 8760 hours. Coagulation Profile: No results for input(s): INR, PROTIME in the last 168 hours. Thyroid Function Tests: No results for input(s): TSH, T4TOTAL, FREET4, T3FREE, THYROIDAB in the last 72 hours. Lipid Profile: No results for input(s): CHOL, HDL, LDLCALC, TRIG, CHOLHDL, LDLDIRECT in the last 72 hours. Anemia Panel: No results for input(s): VITAMINB12, FOLATE, FERRITIN, TIBC, IRON, RETICCTPCT in the last 72 hours. Urine analysis:    Component Value Date/Time   COLORURINE YELLOW 11/07/2023 0930   APPEARANCEUR CLEAR 11/07/2023 0930   LABSPEC 1.017  11/07/2023 0930   PHURINE 5.0 11/07/2023 0930   GLUCOSEU NEGATIVE 11/07/2023 0930   HGBUR NEGATIVE 11/07/2023 0930   BILIRUBINUR NEGATIVE 11/07/2023 0930   KETONESUR NEGATIVE 11/07/2023 0930   PROTEINUR NEGATIVE 11/07/2023 0930   NITRITE NEGATIVE 11/07/2023 0930   LEUKOCYTESUR NEGATIVE 11/07/2023 0930   Sepsis Labs: Invalid input(s): PROCALCITONIN, LACTICIDVEN  Microbiology: Recent Results (from the past 240 hours)  Culture, Respiratory w Gram Stain     Status: None (Preliminary result)   Collection Time: 05/10/24  2:56 PM   Specimen: Tracheal Aspirate; Respiratory  Result Value Ref Range Status   Specimen Description TRACHEAL ASPIRATE  Final   Special Requests NONE  Final   Gram Stain   Final    RARE WBC PRESENT, PREDOMINANTLY PMN MODERATE GRAM POSITIVE COCCI MODERATE GRAM NEGATIVE RODS FEW GRAM POSITIVE RODS    Culture   Final    ABUNDANT GRAM NEGATIVE RODS IDENTIFICATION TO FOLLOW Performed at [facility], [address], [place], [name]  [address]    Report Status PENDING  Incomplete  Culture, blood (Routine X 2) w Reflex to ID Panel     Status: None (Preliminary result)   Collection Time: 05/10/24  4:47 PM   Specimen: BLOOD RIGHT HAND  Result Value Ref Range Status   Specimen Description BLOOD RIGHT HAND  Final   Special Requests   Final    BOTTLES DRAWN AEROBIC AND ANAEROBIC Blood Culture adequate volume   Culture   Final    NO GROWTH < 24 HOURS Performed at [facility], [address] [place], [name] [address]    Report Status PENDING  Incomplete  Culture, blood (Routine X 2) w Reflex to ID Panel     Status: None (Preliminary result)   Collection Time: 05/10/24  4:52 PM   Specimen: BLOOD LEFT ARM  Result Value Ref Range Status   Specimen Description BLOOD LEFT ARM  Final   Special Requests   Final    BOTTLES DRAWN AEROBIC AND ANAEROBIC Blood Culture results may not be optimal due to an inadequate volume of blood received in culture bottles   Culture   Final    NO GROWTH < 24 HOURS Performed at [facility], [address], [place], [name] [address]    Report Status PENDING  Incomplete    Radiology Studies: No results found.    Hamzah Savoca T. Sherrilyn Nairn [facility]  If 7PM-7AM, please contact night-coverage [email or web address] 05/11/2024, 3:47 PM

## 2024-05-11 NOTE — Progress Notes (Signed)
 Called to room due to patient desats. Upon arrival to room, pt w/ sats in the mid to upper 80's on 28% ATC. Pt hyperoxygentated w/ 100% O2 and ETS'd for moderate amt of thick whitish secs. FiO2 decreased back to 28% post ETS.

## 2024-05-12 DIAGNOSIS — Z789 Other specified health status: Secondary | ICD-10-CM | POA: Diagnosis not present

## 2024-05-12 DIAGNOSIS — I619 Nontraumatic intracerebral hemorrhage, unspecified: Secondary | ICD-10-CM | POA: Diagnosis not present

## 2024-05-12 DIAGNOSIS — J9601 Acute respiratory failure with hypoxia: Secondary | ICD-10-CM | POA: Diagnosis not present

## 2024-05-12 DIAGNOSIS — Z930 Tracheostomy status: Secondary | ICD-10-CM | POA: Diagnosis not present

## 2024-05-12 DIAGNOSIS — J9621 Acute and chronic respiratory failure with hypoxia: Secondary | ICD-10-CM | POA: Diagnosis not present

## 2024-05-12 DIAGNOSIS — J9611 Chronic respiratory failure with hypoxia: Secondary | ICD-10-CM | POA: Diagnosis not present

## 2024-05-12 DIAGNOSIS — J9622 Acute and chronic respiratory failure with hypercapnia: Secondary | ICD-10-CM | POA: Diagnosis not present

## 2024-05-12 DIAGNOSIS — L8990 Pressure ulcer of unspecified site, unspecified stage: Secondary | ICD-10-CM | POA: Diagnosis not present

## 2024-05-12 LAB — GLUCOSE, CAPILLARY
Glucose-Capillary: 112 mg/dL — ABNORMAL HIGH (ref 70–99)
Glucose-Capillary: 114 mg/dL — ABNORMAL HIGH (ref 70–99)
Glucose-Capillary: 129 mg/dL — ABNORMAL HIGH (ref 70–99)
Glucose-Capillary: 131 mg/dL — ABNORMAL HIGH (ref 70–99)
Glucose-Capillary: 132 mg/dL — ABNORMAL HIGH (ref 70–99)
Glucose-Capillary: 136 mg/dL — ABNORMAL HIGH (ref 70–99)
Glucose-Capillary: 96 mg/dL (ref 70–99)
Glucose-Capillary: 104 mg/dL — ABNORMAL HIGH (ref 70–99)
Glucose-Capillary: 131 mg/dL — ABNORMAL HIGH (ref 70–99)
Glucose-Capillary: 87 mg/dL (ref 70–99)

## 2024-05-12 LAB — CBC
HCT: 36.8 % — ABNORMAL LOW (ref 39.0–52.0)
Hemoglobin: 10.7 g/dL — ABNORMAL LOW (ref 13.0–17.0)
MCH: 26.9 pg (ref 26.0–34.0)
MCHC: 29.1 g/dL — ABNORMAL LOW (ref 30.0–36.0)
MCV: 92.5 fL (ref 80.0–100.0)
Platelets: 364 K/uL (ref 150–400)
RBC: 3.98 MIL/uL — ABNORMAL LOW (ref 4.22–5.81)
RDW: 15.7 % — ABNORMAL HIGH (ref 11.5–15.5)
WBC: 10.9 K/uL — ABNORMAL HIGH (ref 4.0–10.5)
nRBC: 0 % (ref 0.0–0.2)

## 2024-05-12 LAB — COMPREHENSIVE METABOLIC PANEL WITH GFR
ALT: 115 U/L — ABNORMAL HIGH (ref 0–44)
AST: 50 U/L — ABNORMAL HIGH (ref 15–41)
Albumin: 3 g/dL — ABNORMAL LOW (ref 3.5–5.0)
Alkaline Phosphatase: 65 U/L (ref 38–126)
Anion gap: 11 (ref 5–15)
BUN: 23 mg/dL — ABNORMAL HIGH (ref 6–20)
CO2: 32 mmol/L (ref 22–32)
Calcium: 9.2 mg/dL (ref 8.9–10.3)
Chloride: 100 mmol/L (ref 98–111)
Creatinine, Ser: 0.44 mg/dL — ABNORMAL LOW (ref 0.61–1.24)
GFR, Estimated: >60 mL/min (ref >60)
Glucose, Bld: 124 mg/dL — ABNORMAL HIGH (ref 70–99)
Potassium: 3.6 mmol/L (ref 3.5–5.1)
Sodium: 143 mmol/L (ref 135–145)
Total Bilirubin: 0.2 mg/dL (ref 0.0–1.2)
Total Protein: 7.9 g/dL (ref 6.5–8.1)

## 2024-05-12 LAB — CK: Total CK: 45 U/L — ABNORMAL LOW (ref 49–397)

## 2024-05-12 LAB — CULTURE, RESPIRATORY W GRAM STAIN

## 2024-05-12 MED ORDER — ORAL CARE MOUTH RINSE
15.0000 mL | OROMUCOSAL | Status: DC
  Administered 2024-05-12 – 2024-05-24 (×146): 15 mL via OROMUCOSAL

## 2024-05-12 MED ORDER — FREE WATER
150.0000 mL | Freq: Four times a day (QID) | Status: DC
  Administered 2024-05-12 – 2024-06-21 (×157): 150 mL

## 2024-05-12 NOTE — Progress Notes (Signed)
 NAME:  Garrett [name], MRN:  [id number], DOB:  [birth year], LOS: 198 ADMISSION DATE:  10/27/2023, CONSULTATION DATE:  10/27/2023 REFERRING MD: Regalado - TRH, CHIEF COMPLAINT:  Found down   History of Present Illness:  48 y/o man who was found down at home. PMHx significant for HTN.  Patient was down for an unknown amount of time, found by his fiance when she came home from work, reportedly with agonal breathing. Patient had driven her to work the morning of admission.  He has HTN but never checks his BP and does not follow with MD. Patient's fiance states that she can tell when his BP is really high; his eyes get blood shot and he is sweating. No h/o seizures. Besides almost daily marijuana and cigarette smoking, she is not aware of any other drug use. Drug screen positive for opioids and he was given several doses of Narcan . 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 BP in ED 262/156. CXR revealed RUL and perihilar infiltrates suggesting aspiration pneumonia. ED labs also revealed PH 7.169, LA 4.4, CPK 985. Patient was intubated in ED.  PCCM consulted.  Pertinent Medical History:  Hypertension  Significant Hospital Events: Including procedures, antibiotic start and stop dates in addition to other pertinent events   3/08 - Intubated in ED, pontine bleed/SAH. CT head 17:09 Acute large hemorrhage 4.1 cm in pons with intraventricular extension without hydrocephalus 3/09 - CTA 03:14 AM No change in IPH in pons, similar biparietal [facility] 3/14 - Now awake, appears locked in can follow commands with eyes. 3/16 - Purposeful with left upper extremity  3/22 - Tracheostomy placed at bedside, bleeding issues overnight from trach site 3/24 - PEG (Dr. Sebastian) 3/24 - Sputum culture with MRSA 3/27  - Vomited. TF on hold. Abd film c/w ileus. Added reglan , got SSE. Did tolerate PSV all day  3/28 - BMs x2 after SSE day prior. Added back TFs at 1/2 rated. Tolerated PS 3/29 - Tolerated PS  3/31 - Tolerating CPAP PS 15/5, TF on hold due to ileus. 4/01 - Con't to hold tube feeds today, restarting vancomycin  and sending tracheal aspirate as peaks are 37, plat 24, driving 19. Thick secretions. Fever overnight.  4/02 - Peaks improved. Ongoing hiccups. Trickle feeding. Neuro exam unchanged.  4/20 - Trach was changed to cuffless #8 4/28 - Not safe to swallow. Limited communication and he follows simple commands. On TF 5/01 - On TC 28%, with large secretions. Working with speech and he is improving to initiate PMV trials under ST supervision   5/05 - On TC 30%, 7 L/min. Trach #6 cuffless, changed 5/1 to facilitate PMV trials  5/12 - On TC 21%, 6 L/min. Trach #6 cuffless, unable to produce sounds or speak on PMV. No significant resp secretions. On PEG TF 6/10 - Tracheostomy was removed due to failure to well provide adequate airway 02/15/2024 pulmonary critical care consult. 7/07 Patient obtunded and desaturating on NRB; transfer to icu for intubation; updated mother over phone who wants to reverse code status to full code; ent consulted 7/09 Remains intubated on no continuous sedation, right eye open but does not track movement  7/16 Revision ENT trach with [facility] Flap 7/18 Weaned on vent 5 hours  7/19 Tolerated trach collar for the majority of the day but had some increased work of breath  resulting in being placed back on vent support  7/20 No issues overnight, back on ATC this AM 7/21 Did not tolerate ATC. Placed back on PRVC. 7/24 ATC until afternoon and went on vent for WOB reportedly  7/25 ATC for a few hours  7/26 ATC x 6h 7/28 tolerating trach collar-tolerated for most of the day 7/30 on vent overnight and back on trach collar today 8/1 back on vent overnight  8/2 was able to maintain off of  ventilator overnight but by 6 AM today patient was seen with signs of increased work of breathing including tachycardia therefore patient was placed back on ventilator 8/3 remained on ventilator overnight to transition to trach collar this a.m. 8/4 did 18 hour ATC yesterday; back on vent 3am today 8/5 TC for 12-14 hours. Then went back to vent because of tachypnea.  8/6 tolerated ATC at 30% for 24 hours. Back on vent this AM for rest as was a bit tachypneic 8/11 been off vent >24 hours. 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  8/12-off vent for over 48 hours 8/18 completed abx  8/19 Changed to cuffless trach 8/20 Increasing tachypnea, hypercarbia.  Trach changed back to #6 cuffed and brought back to ICU to be placed on ventilator.  Antibiotics broadened to linezolid  and cefepime  8/23 PSV wean 8/24 eeg for twitching no sz 8/26 started TC 8/27 did TC overnight, hypercarbic on repeat gas but compensated 9/2 periods of apnea but overall doing ok ATC day vent night 9/8 continuing noct vent Atc day  9/22 Tolerating ATC during day and nocturnal ventilation  Interim History / Subjective:  Tolerating trach collar  S/p trach change on 05/08/24. No secretions per [facility]  Had fever once on 9/20 100.6. Afebrile since then with no leukocytosis  Objective:   Blood pressure 131/89, pulse 76, temperature 97.8 F (36.6 C), temperature source Oral, resp. rate (!) 21, height 5' 8 (1.727 m), weight 93 kg, SpO2 97%.    Vent Mode: PRVC FiO2 (%):  [28 %-40 %] 28 % Set Rate:  [12 bmp] 12 bmp Vt Set:  [550 mL] 550 mL PEEP:  [5 cmH20] 5 cmH20 Plateau Pressure:  [17 cmH20] 17 cmH20   Intake/Output Summary (Last 24 hours) at 05/12/2024 1152 Last data filed at 05/12/2024 1000 Gross per 24 hour  Intake 1374 ml  Output 1140 ml  Net 234 ml   Filed Weights   05/10/24 0500 05/11/24 0400 05/12/24 0417  Weight: 92 kg 92 kg 93 kg   Physical Exam: General:  Chronically ill-appearing, no acute distress HENT: [place], AT, OP clear, MMM Neck: Cuffed trach c/d/I Eyes: EOMI, no scleral icterus Respiratory: Clear to auscultation bilaterally.  No crackles, wheezing or rales Cardiovascular: RRR, -M/R/G, no JVD GI: BS+, soft, nontender, PEG Extremities:-Edema,-tenderness Neuro: Eyes open, upward tracking, does not follow commands, chronic right facial twitching GU: Foley in place   Resolved Problem List:  Post sedation hypotension Left eye conjunctivitis - resolved  Hyperglycemia, resolved Pseudomonas and E. 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 Not a candidate for decannulation given ineffective airway clearance -course since then has been c/b recurrent HCAP, variable vent need-- at times having decompensations req 24hr vent.  -most recently he has had noct hypercarbia and its felt that he needs noct vent. He continues to demonstrate hypercarbia after prolonged ATC trials  Plan: Not candidate for decannulation EVER Continue ATC during the day as tolerated; mandatory HS and PRN Ventilation Routine trach care PRN nebs VAP bundle  F/u cultures however suspect this represents colonization  Best Practice (right click and Reselect all SmartList Selections daily)   Per primary  We will cont to follow   Critical care time: N/A   Care Time: 25 min  Slater Staff,  M.D. [facility] Pulmonary/Critical Care Medicine 05/12/2024 12:09 PM   See Amion for personal pager For hours between 7 PM to 7 AM, please call Elink for urgent questions

## 2024-05-12 NOTE — Progress Notes (Signed)
 PROGRESS NOTE  Garrett [name] FMW:[id number] DOB: 1976/07/04   PCP: Pcp, No  Patient is from: Home  DOA: 10/27/2023 LOS: 198  Chief complaints Chief Complaint  Patient presents with   Unresponsive     Brief Narrative / Interim history:  48 y/o male with past medical history of hypertension was found down at home agonal breathing. History of uncontrolled hypertension but never had been to the doctor. Patient was noted to have a large (4.1 cm) hemorrhage centered in the pons with intraventricular extension into the fourth ventricle and also extension into the basal cisterns and trace additional subarachnoid hemorrhage along the left parietal convexity. BP in ED was 262/156. CXR revealed RUL and perihilar infiltrates suggesting aspiration pneumonia. ED labs also revealed PH 7.169, LA 4.4, CPK 985. Patient was intubated in ED and was admitted hospital.    Patient had prolonged complicated hospital stay pontine hemorrhage, biparietal SAH.  Patient's mental status poor, s/p tracheostomy and PEG placement.  Sputum cultures positive for MRSA on contact precautions.  Patient had prolonged difficulty weaning from vent.  Patient is on trach collar for few days but later required ventilatory support mostly at nights and is currently in ICU.  Subjective: Seen and examined earlier this afternoon.  No major events overnight of this morning.  No further fever.   Objective: Vitals:   05/12/24 1131 05/12/24 1200 05/12/24 1400 05/12/24 1510  BP: 131/89 (!) 122/93 (!) 125/94   Pulse: 76 75 68   Resp: (!) 21 (!) 28 (!) 25   Temp:    98.6 F (37 C)  TempSrc:    Oral  SpO2: 97% 95% 96%   Weight:      Height:        Examination:  GENERAL: No apparent distress.  HEENT: MMM.  Left eye conjunctival injection and corneal clouding. NECK: Tracheostomy in place. RESP:  No IWOB.  Fair aeration bilaterally. CVS:  RRR. Heart sounds normal.  ABD/GI/GU: BS+. Abd soft, NTND.  G-tube in place. MSK/EXT: No  apparent deformity but does not move extremities. NEURO: Does not response to verbal or tactile stimuli.  Blinks eyes.  Tracking?  Does not move extremities. PSYCH: No distress or agitation.   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 -Continue supportive care-trach and vent and tube feed.  -On and off tachycardia, tachypnea noted. -Continue amlodipine , Lopressor  and as needed hydralazine . -Mental status continue to be poor.   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  Not a candidate for decannulation given ineffective airway clearance, -Continue trach collar during day with vent support at night. On and off tachypnea persists. -Continue trach care per protocol. -Pulmonary follow up appreciated.    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 Waylan evaluated and placed tarsorraphy.   -8/29-ophtho rec: Erythromycin  eyedrop q2h, tape eyes with paper tape if not blinking. -9/14, ophtho, Dr. Leane reengaged due to worsening conjunctivitis and clouding I recommended second antibiotic, gatifloxacin  4 times daily.per Optho, no epithelial defects noted.   SIRS: Blood cultures NGTD.  CXR unrevealing.  No leukocytosis.  Resp culture with Pseudomonas which could be just colonization since fever resolved without antibiotics. - Monitor off antibiotics  Hypernatremia: Resolved. - Decrease free water  from 200 cc to 150 cc every 6 hours.  Elevated liver enzymes: Mild.  Unclear etiology.  CK normal. - Check CK in the morning  Inadequate oral intake Body mass index is  31.17 kg/m. Nutrition Problem: Inadequate oral intake Etiology: inability to eat Signs/Symptoms: NPO status Interventions: Tube feeding, Prostat, MVI, Juven   DVT prophylaxis:  enoxaparin  (LOVENOX ) injection 40 mg Start: 03/06/24 1000 SCDs Start: 10/27/23 2226  Code  Status: Full code Family Communication: None at bedside Level of care: ICU Status is: Inpatient Remains inpatient appropriate because: Intracranial hemorrhage, vegetative state   Final disposition: Unclear   35 minutes with more than 50% spent in reviewing records, counseling patient/family and coordinating care.   Sch Meds:  Scheduled Meds:  amLODipine   10 mg Per Tube Daily   Chlorhexidine  Gluconate Cloth  6 each Topical Daily   enoxaparin  (LOVENOX ) injection  40 mg Subcutaneous Daily   erythromycin    Both Eyes Q2H   famotidine   20 mg Per Tube BID   feeding supplement (JEVITY 1.5 CAL/FIBER)  237 mL Per Tube 5 X Daily   feeding supplement (PROSource TF20)  60 mL Per Tube TID   free water   200 mL Per Tube Q6H   gatifloxacin   1 drop Both Eyes QID   insulin  aspart  0-9 Units Subcutaneous Q6H   metoprolol  tartrate  75 mg Per Tube BID   multivitamin with minerals  1 tablet Per Tube Daily   mouth rinse  15 mL Mouth Rinse Q2H   polyethylene glycol  17 g Per Tube Daily   Continuous Infusions: PRN Meds:.acetaminophen  **OR** acetaminophen  (TYLENOL ) oral liquid 160 mg/5 mL **OR** acetaminophen , bisacodyl , Gerhardt's butt cream, hydrALAZINE , ipratropium-albuterol , mouth rinse, traZODone   Antimicrobials: Anti-infectives (From admission, onward)    Start     Dose/Rate Route Frequency Ordered Stop   04/09/24 2200  linezolid  (ZYVOX ) tablet 600 mg  Status:  Discontinued        600 mg Per Tube Every 12 hours 04/09/24 1132 04/13/24 1332   04/09/24 1700  vancomycin  (VANCOREADY) IVPB 1500 mg/300 mL  Status:  Discontinued        1,500 mg 150 mL/hr over 120 Minutes Intravenous Every 12 hours 04/09/24 1108 04/09/24 1132   04/09/24 1000  linezolid  (ZYVOX ) IVPB 600 mg  Status:  Discontinued        600 mg 300 mL/hr over 60 Minutes Intravenous Every 12 hours 04/09/24 0527 04/09/24 1108   04/01/24 1145  ceFEPIme  (MAXIPIME ) 2 g in sodium chloride  0.9 % 100 mL IVPB        2 g 200 mL/hr over 30  Minutes Intravenous Every 8 hours 04/01/24 1137 04/14/24 2230   02/21/24 1000  vancomycin  (VANCOREADY) IVPB 1250 mg/250 mL        1,250 mg 166.7 mL/hr over 90 Minutes Intravenous Every 12 hours 02/20/24 2158 02/28/24 0130   02/16/24 1445  ceFEPIme  (MAXIPIME ) 2 g in sodium chloride  0.9 % 100 mL IVPB        2 g 200 mL/hr over 30 Minutes Intravenous Every 8 hours 02/16/24 1348 02/27/24 2322   02/16/24 0945  vancomycin  (VANCOREADY) IVPB 1500 mg/300 mL  Status:  Discontinued        1,500 mg 150 mL/hr over 120 Minutes Intravenous Every 12 hours 02/16/24 0848 02/20/24 2158   02/15/24 1215  Ampicillin -Sulbactam (UNASYN ) 3 g in sodium chloride  0.9 % 100 mL IVPB  Status:  Discontinued        3 g 200 mL/hr over 30 Minutes Intravenous Every 6 hours 02/15/24 1125 02/16/24 1226   01/24/24 2000  vancomycin  (VANCOREADY) IVPB 1250 mg/250 mL        1,250 mg 166.7 mL/hr over 90 Minutes Intravenous  Every 24 hours 01/24/24 1154 01/27/24 0125   01/22/24 2200  Vancomycin  (VANCOCIN ) 1,250 mg in sodium chloride  0.9 % 250 mL IVPB  Status:  Discontinued        1,250 mg 166.7 mL/hr over 90 Minutes Intravenous Every 24 hours 01/22/24 1013 01/22/24 1020   01/22/24 2000  vancomycin  (VANCOREADY) IVPB 1250 mg/250 mL  Status:  Discontinued        1,250 mg 166.7 mL/hr over 90 Minutes Intravenous Every 24 hours 01/22/24 1020 01/24/24 1154   01/20/24 2000  ceFEPIme  (MAXIPIME ) 2 g in sodium chloride  0.9 % 100 mL IVPB  Status:  Discontinued        2 g 200 mL/hr over 30 Minutes Intravenous Every 8 hours 01/20/24 1900 01/24/24 1154   01/20/24 2000  Vancomycin  (VANCOCIN ) 1,250 mg in sodium chloride  0.9 % 250 mL IVPB  Status:  Discontinued        1,250 mg 166.7 mL/hr over 90 Minutes Intravenous Every 24 hours 01/20/24 1900 01/22/24 1013   01/03/24 1730  cefTRIAXone  (ROCEPHIN ) 1 g in sodium chloride  0.9 % 100 mL IVPB        1 g 200 mL/hr over 30 Minutes Intravenous Every 24 hours 01/03/24 1235 01/07/24 0710   01/02/24 1830   ceFEPIme  (MAXIPIME ) 2 g in sodium chloride  0.9 % 100 mL IVPB  Status:  Discontinued        2 g 200 mL/hr over 30 Minutes Intravenous Every 8 hours 01/02/24 1732 01/03/24 1235   01/02/24 1830  Vancomycin  (VANCOCIN ) 1,250 mg in sodium chloride  0.9 % 250 mL IVPB  Status:  Discontinued        1,250 mg 166.7 mL/hr over 90 Minutes Intravenous Every 24 hours 01/02/24 1732 01/04/24 1754   01/01/24 1200  cefTRIAXone  (ROCEPHIN ) 1 g in sodium chloride  0.9 % 100 mL IVPB  Status:  Discontinued        1 g 200 mL/hr over 30 Minutes Intravenous Every 24 hours 01/01/24 1057 01/02/24 1713   11/29/23 2200  linezolid  (ZYVOX ) tablet 600 mg        600 mg Per Tube Every 12 hours 11/29/23 1034 12/04/23 2215   11/28/23 1000  linezolid  (ZYVOX ) IVPB 600 mg  Status:  Discontinued        600 mg 300 mL/hr over 60 Minutes Intravenous Every 12 hours 11/28/23 0831 11/29/23 1034   11/23/23 1200  vancomycin  (VANCOREADY) IVPB 1250 mg/250 mL  Status:  Discontinued        1,250 mg 166.7 mL/hr over 90 Minutes Intravenous Every 24 hours 11/23/23 1036 11/28/23 0831   11/20/23 1100  vancomycin  (VANCOREADY) IVPB 1250 mg/250 mL  Status:  Discontinued        1,250 mg 166.7 mL/hr over 90 Minutes Intravenous Every 24 hours 11/20/23 0923 11/20/23 0923   11/20/23 1100  vancomycin  (VANCOREADY) IVPB 1250 mg/250 mL  Status:  Discontinued        1,250 mg 166.7 mL/hr over 90 Minutes Intravenous Every 24 hours 11/20/23 0923 11/23/23 1036   11/15/23 2300  vancomycin  (VANCOREADY) IVPB 1250 mg/250 mL        1,250 mg 166.7 mL/hr over 90 Minutes Intravenous Every 24 hours 11/15/23 2215 11/19/23 0924   11/12/23 2200  vancomycin  (VANCOREADY) IVPB 750 mg/150 mL  Status:  Discontinued        750 mg 150 mL/hr over 60 Minutes Intravenous Every 12 hours 11/12/23 0855 11/15/23 2215   11/12/23 0930  vancomycin  (VANCOREADY) IVPB 2000 mg/400 mL  2,000 mg 200 mL/hr over 120 Minutes Intravenous  Once 11/12/23 0838 11/12/23 1116   11/11/23 1345   Ampicillin -Sulbactam (UNASYN ) 3 g in sodium chloride  0.9 % 100 mL IVPB  Status:  Discontinued        3 g 200 mL/hr over 30 Minutes Intravenous Every 6 hours 11/11/23 1257 11/12/23 0836   10/28/23 0400  Ampicillin -Sulbactam (UNASYN ) 3 g in sodium chloride  0.9 % 100 mL IVPB  Status:  Discontinued        3 g 200 mL/hr over 30 Minutes Intravenous Every 6 hours 10/27/23 2151 11/02/23 0945   10/27/23 1845  vancomycin  (VANCOCIN ) IVPB 1000 mg/200 mL premix  Status:  Discontinued        1,000 mg 200 mL/hr over 60 Minutes Intravenous  Once 10/27/23 1843 10/27/23 1843   10/27/23 1845  ceFEPIme  (MAXIPIME ) 2 g in sodium chloride  0.9 % 100 mL IVPB        2 g 200 mL/hr over 30 Minutes Intravenous  Once 10/27/23 1843 10/27/23 2024   10/27/23 1845  vancomycin  (VANCOREADY) IVPB 2000 mg/400 mL        2,000 mg 200 mL/hr over 120 Minutes Intravenous  Once 10/27/23 1843 10/27/23 2123        I have personally reviewed the following labs and images: CBC: Recent Labs  Lab 05/10/24 1652 05/11/24 0205 05/12/24 1258  WBC 10.5 9.9 10.9*  NEUTROABS 7.7  --   --   HGB 11.1* 10.0* 10.7*  HCT 38.1* 34.3* 36.8*  MCV 92.0 93.5 92.5  PLT 404* 367 364   BMP &GFR Recent Labs  Lab 05/10/24 1652 05/11/24 0205 05/12/24 1258  NA 148* 148* 143  K 4.1 4.2 3.6  CL 108 109 100  CO2 27 29 32  GLUCOSE 129* 115* 124*  BUN 27* 30* 23*  CREATININE 0.46* 0.49* 0.44*  CALCIUM 9.3 9.2 9.2  MG 2.3 2.3  --   PHOS  --  4.0  --    Estimated Creatinine Clearance: 126.3 mL/min (A) (by C-G formula based on SCr of 0.44 mg/dL (L)). Liver & Pancreas: Recent Labs  Lab 05/10/24 1652 05/11/24 0205 05/12/24 1258  AST 56* 53* 50*  ALT 107* 109* 115*  ALKPHOS 68 61 65  BILITOT 0.4 0.4 0.2  PROT 8.5* 7.5 7.9  ALBUMIN  3.3* 2.9* 3.0*   No results for input(s): LIPASE, AMYLASE in the last 168 hours. No results for input(s): AMMONIA in the last 168 hours. Diabetic: No results for input(s): HGBA1C in the last 72  hours. Recent Labs  Lab 05/11/24 0613 05/11/24 1329 05/11/24 1802 05/11/24 1946 05/12/24 1113  GLUCAP 114* 96 144* 149* 132*   Cardiac Enzymes: Recent Labs  Lab 05/12/24 1258  CKTOTAL 45*   No results for input(s): PROBNP in the last 8760 hours. Coagulation Profile: No results for input(s): INR, PROTIME in the last 168 hours. Thyroid Function Tests: No results for input(s): TSH, T4TOTAL, FREET4, T3FREE, THYROIDAB in the last 72 hours. Lipid Profile: No results for input(s): CHOL, HDL, LDLCALC, TRIG, CHOLHDL, LDLDIRECT in the last 72 hours. Anemia Panel: No results for input(s): VITAMINB12, FOLATE, FERRITIN, TIBC, IRON, RETICCTPCT in the last 72 hours. Urine analysis:    Component Value Date/Time   COLORURINE YELLOW 11/07/2023 0930   APPEARANCEUR CLEAR 11/07/2023 0930   LABSPEC 1.017 11/07/2023 0930   PHURINE 5.0 11/07/2023 0930   GLUCOSEU NEGATIVE 11/07/2023 0930   HGBUR NEGATIVE 11/07/2023 0930   BILIRUBINUR NEGATIVE 11/07/2023 0930   KETONESUR NEGATIVE 11/07/2023  0930   PROTEINUR NEGATIVE 11/07/2023 0930   NITRITE NEGATIVE 11/07/2023 0930   LEUKOCYTESUR NEGATIVE 11/07/2023 0930   Sepsis Labs: Invalid input(s): PROCALCITONIN, LACTICIDVEN  Microbiology: Recent Results (from the past 240 hours)  Culture, Respiratory w Gram Stain     Status: None   Collection Time: 05/10/24  2:56 PM   Specimen: Tracheal Aspirate; Respiratory  Result Value Ref Range Status   Specimen Description TRACHEAL ASPIRATE  Final   Special Requests NONE  Final   Gram Stain   Final    RARE WBC PRESENT, PREDOMINANTLY PMN MODERATE GRAM POSITIVE COCCI MODERATE GRAM NEGATIVE RODS FEW GRAM POSITIVE RODS Performed at [facility], [address], [place], [name] [address]    Culture ABUNDANT PSEUDOMONAS AERUGINOSA  Final   Report Status 05/12/2024 FINAL  Final   Organism ID, Bacteria PSEUDOMONAS AERUGINOSA  Final      Susceptibility    Pseudomonas aeruginosa - MIC*    MEROPENEM 1 SENSITIVE Sensitive     CIPROFLOXACIN  0.25 SENSITIVE Sensitive     IMIPENEM 2 SENSITIVE Sensitive     PIP/TAZO Value in next row Sensitive      <=4 SENSITIVEThis is a modified FDA-approved test that has been validated and its performance characteristics determined by the reporting laboratory.  This laboratory is certified under the Clinical Laboratory Improvement Amendments CLIA as qualified to perform high complexity clinical laboratory testing.    CEFEPIME  Value in next row Sensitive      <=4 SENSITIVEThis is a modified FDA-approved test that has been validated and its performance characteristics determined by the reporting laboratory.  This laboratory is certified under the Clinical Laboratory Improvement Amendments CLIA as qualified to perform high complexity clinical laboratory testing.    CEFTAZIDIME/AVIBACTAM Value in next row Sensitive      <=4 SENSITIVEThis is a modified FDA-approved test that has been validated and its performance characteristics determined by the reporting laboratory.  This laboratory is certified under the Clinical Laboratory Improvement Amendments CLIA as qualified to perform high complexity clinical laboratory testing.    CEFTOLOZANE/TAZOBACTAM Value in next row Sensitive      <=4 SENSITIVEThis is a modified FDA-approved test that has been validated and its performance characteristics determined by the reporting laboratory.  This laboratory is certified under the Clinical Laboratory Improvement Amendments CLIA as qualified to perform high complexity clinical laboratory testing.    TOBRAMYCIN Value in next row Sensitive      <=4 SENSITIVEThis is a modified FDA-approved test that has been validated and its performance characteristics determined by the reporting laboratory.  This laboratory is certified under the Clinical Laboratory Improvement Amendments CLIA as qualified to perform high complexity clinical laboratory testing.     CEFTAZIDIME Value in next row Sensitive      <=4 SENSITIVEThis is a modified FDA-approved test that has been validated and its performance characteristics determined by the reporting laboratory.  This laboratory is certified under the Clinical Laboratory Improvement Amendments CLIA as qualified to perform high complexity clinical laboratory testing.    * ABUNDANT PSEUDOMONAS AERUGINOSA  Culture, blood (Routine X 2) w Reflex to ID Panel     Status: None (Preliminary result)   Collection Time: 05/10/24  4:47 PM   Specimen: BLOOD RIGHT HAND  Result Value Ref Range Status   Specimen Description BLOOD RIGHT HAND  Final   Special Requests   Final    BOTTLES DRAWN AEROBIC AND ANAEROBIC Blood Culture adequate volume   Culture   Final  NO GROWTH 2 DAYS Performed at [facility], [address], [place], [name] [address]    Report Status PENDING  Incomplete  Culture, blood (Routine X 2) w Reflex to ID Panel     Status: None (Preliminary result)   Collection Time: 05/10/24  4:52 PM   Specimen: BLOOD LEFT ARM  Result Value Ref Range Status   Specimen Description BLOOD LEFT ARM  Final   Special Requests   Final    BOTTLES DRAWN AEROBIC AND ANAEROBIC Blood Culture results may not be optimal due to an inadequate volume of blood received in culture bottles   Culture   Final    NO GROWTH 2 DAYS Performed at [facility], [address], [place], [name] [address]    Report Status PENDING  Incomplete    Radiology Studies: No results found.    Matilyn Fehrman T. [facility]  If 7PM-7AM, please contact night-coverage [email or web address] 05/12/2024, 3:22 PM

## 2024-05-13 DIAGNOSIS — J9601 Acute respiratory failure with hypoxia: Secondary | ICD-10-CM | POA: Diagnosis not present

## 2024-05-13 DIAGNOSIS — L8990 Pressure ulcer of unspecified site, unspecified stage: Secondary | ICD-10-CM | POA: Diagnosis not present

## 2024-05-13 DIAGNOSIS — Z789 Other specified health status: Secondary | ICD-10-CM | POA: Diagnosis not present

## 2024-05-13 DIAGNOSIS — I619 Nontraumatic intracerebral hemorrhage, unspecified: Secondary | ICD-10-CM | POA: Diagnosis not present

## 2024-05-13 DIAGNOSIS — Z930 Tracheostomy status: Secondary | ICD-10-CM | POA: Diagnosis not present

## 2024-05-13 DIAGNOSIS — J9611 Chronic respiratory failure with hypoxia: Secondary | ICD-10-CM | POA: Diagnosis not present

## 2024-05-13 LAB — GLUCOSE, CAPILLARY
Glucose-Capillary: 103 mg/dL — ABNORMAL HIGH (ref 70–99)
Glucose-Capillary: 115 mg/dL — ABNORMAL HIGH (ref 70–99)
Glucose-Capillary: 85 mg/dL (ref 70–99)
Glucose-Capillary: 99 mg/dL (ref 70–99)

## 2024-05-13 NOTE — TOC Progression Note (Signed)
 Transition of Care [facility]) - Progression Note    Patient Details  Name: Kypton Eltringham MRN: [id number] Date of Birth: April 11, 1976  Transition of Care [facility] CM/SW Contact  Lendia Dais, [place] Phone Number: 05/13/2024, 12:09 PM  Clinical Narrative: Barriers still remain.   CSW received an email from [place], disability specialist at the [facility] and stated that everything is pending through social services and is on their timeline. SS will send more information to the pt's mother.  CSW will continue to follow.   Expected Discharge Plan: Long Term Nursing Home Barriers to Discharge: Continued Medical Work up, SNF Pending Medicaid, SNF Pending bed offer, SNF Pending payor source - LOG, Inadequate or no insurance (New trach)               Expected Discharge Plan and Services In-house Referral: Clinical Social Work, Hospice / Palliative Care   Post Acute Care Choice: Skilled Nursing Facility Living arrangements for the past 2 months: Single Family Home                                       Social Drivers of Health (SDOH) Interventions SDOH Screenings   Food Insecurity: Patient Unable To Answer (10/29/2023)  Social Connections: Unknown (06/23/2023)   Received from [facility]  Tobacco Use: High Risk (10/31/2023)    Readmission Risk Interventions     No data to display

## 2024-05-13 NOTE — Progress Notes (Signed)
 PROGRESS NOTE  Garrett [name] FMW:[id number] DOB: 09-20-1975   PCP: Pcp, No  Patient is from: Home  DOA: 10/27/2023 LOS: 199  Chief complaints Chief Complaint  Patient presents with   Unresponsive     Brief Narrative / Interim history:  48 y/o male with past medical history of hypertension was found down at home agonal breathing. History of uncontrolled hypertension but never had been to the doctor. Patient was noted to have a large (4.1 cm) hemorrhage centered in the pons with intraventricular extension into the fourth ventricle and also extension into the basal cisterns and trace additional subarachnoid hemorrhage along the left parietal convexity. BP in ED was 262/156. CXR revealed RUL and perihilar infiltrates suggesting aspiration pneumonia. ED labs also revealed PH 7.169, LA 4.4, CPK 985. Patient was intubated in ED and was admitted hospital.    Patient had prolonged complicated hospital stay pontine hemorrhage, biparietal SAH.  Patient's mental status poor, s/p tracheostomy and PEG placement.  Sputum cultures positive for MRSA on contact precautions.  Patient had prolonged difficulty weaning from vent.  Patient is on trach collar for few days but later required ventilatory support mostly at nights and is currently in ICU.  Subjective: Seen and examined earlier this afternoon.  No major events overnight of this morning.  No further fever.   Objective: Vitals:   05/13/24 1200 05/13/24 1400 05/13/24 1526 05/13/24 1537  BP: (!) 138/96 (!) 133/93 (!) 129/95   Pulse: 75 76 87   Resp: (!) 32 (!) 26    Temp:    98.6 F (37 C)  TempSrc:    Axillary  SpO2: 99% 95%    Weight:      Height:        Examination:  GENERAL: No apparent distress.  HEENT: MMM.  Left eye conjunctival injection and corneal clouding. NECK: Tracheostomy in place. RESP:  No IWOB.  Fair aeration bilaterally. CVS:  RRR. Heart sounds normal.  ABD/GI/GU: BS+. Abd soft, NTND.  G-tube in place. MSK/EXT: No  apparent deformity but does not move extremities. NEURO: Does not response to verbal or tactile stimuli.  Blinks eyes.  Tracking?  Does not move extremities. PSYCH: No distress or agitation.   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 -Continue supportive care-trach and vent and tube feed.  -On and off tachycardia, tachypnea noted. -Continue amlodipine , Lopressor  and as needed hydralazine . -Mental status continue to be poor.   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  Not a candidate for decannulation given ineffective airway clearance, -Continue trach collar during day with vent support at night. On and off tachypnea persists. -Continue trach care per protocol. -Pulmonary follow up appreciated.    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 Waylan evaluated and placed tarsorraphy.   -8/29-ophtho rec: Erythromycin  eyedrop q2h, tape eyes with paper tape if not blinking. -9/14, ophtho, Dr. Leane reengaged due to worsening conjunctivitis and clouding I recommended second antibiotic, gatifloxacin  4 times daily.per Optho, no epithelial defects noted.   SIRS: Blood cultures NGTD.  CXR unrevealing.  No leukocytosis.  Resp culture with Pseudomonas which could be just colonization since fever resolved without antibiotics.  No report of increased secretion. -Monitor off antibiotics  Hypernatremia: Resolved. - Continue free water  at 150 cc every 6 hours. - Intermittently monitor labs  Elevated liver enzymes: Mild.  Unclear etiology.  CK normal. - Check intermittently.  Inadequate oral intake Body mass index is 31.17 kg/m. Nutrition Problem: Inadequate oral intake Etiology: inability to eat Signs/Symptoms: NPO status Interventions: Tube feeding, Prostat, MVI, Juven   DVT prophylaxis:  enoxaparin   (LOVENOX ) injection 40 mg Start: 03/06/24 1000 SCDs Start: 10/27/23 2226  Code Status: Full code Family Communication: None at bedside Level of care: ICU Status is: Inpatient Remains inpatient appropriate because: Intracranial hemorrhage, vegetative state   Final disposition: Unclear   35 minutes with more than 50% spent in reviewing records, counseling patient/family and coordinating care.   Sch Meds:  Scheduled Meds:  amLODipine   10 mg Per Tube Daily   Chlorhexidine  Gluconate Cloth  6 each Topical Daily   enoxaparin  (LOVENOX ) injection  40 mg Subcutaneous Daily   erythromycin    Both Eyes Q2H   famotidine   20 mg Per Tube BID   feeding supplement (JEVITY 1.5 CAL/FIBER)  237 mL Per Tube 5 X Daily   feeding supplement (PROSource TF20)  60 mL Per Tube TID   free water   150 mL Per Tube Q6H   gatifloxacin   1 drop Both Eyes QID   insulin  aspart  0-9 Units Subcutaneous Q6H   metoprolol  tartrate  75 mg Per Tube BID   multivitamin with minerals  1 tablet Per Tube Daily   mouth rinse  15 mL Mouth Rinse Q2H   polyethylene glycol  17 g Per Tube Daily   Continuous Infusions: PRN Meds:.acetaminophen  **OR** acetaminophen  (TYLENOL ) oral liquid 160 mg/5 mL **OR** acetaminophen , bisacodyl , Gerhardt's butt cream, hydrALAZINE , ipratropium-albuterol , mouth rinse, traZODone   Antimicrobials: Anti-infectives (From admission, onward)    Start     Dose/Rate Route Frequency Ordered Stop   04/09/24 2200  linezolid  (ZYVOX ) tablet 600 mg  Status:  Discontinued        600 mg Per Tube Every 12 hours 04/09/24 1132 04/13/24 1332   04/09/24 1700  vancomycin  (VANCOREADY) IVPB 1500 mg/300 mL  Status:  Discontinued        1,500 mg 150 mL/hr over 120 Minutes Intravenous Every 12 hours 04/09/24 1108 04/09/24 1132   04/09/24 1000  linezolid  (ZYVOX ) IVPB 600 mg  Status:  Discontinued        600 mg 300 mL/hr over 60 Minutes Intravenous Every 12 hours 04/09/24 0527 04/09/24 1108   04/01/24 1145  ceFEPIme   (MAXIPIME ) 2 g in sodium chloride  0.9 % 100 mL IVPB        2 g 200 mL/hr over 30 Minutes Intravenous Every 8 hours 04/01/24 1137 04/14/24 2230   02/21/24 1000  vancomycin  (VANCOREADY) IVPB 1250 mg/250 mL        1,250 mg 166.7 mL/hr over 90 Minutes Intravenous Every 12 hours 02/20/24 2158 02/28/24 0130   02/16/24 1445  ceFEPIme  (MAXIPIME ) 2 g in sodium chloride  0.9 % 100 mL IVPB        2 g 200 mL/hr over 30 Minutes Intravenous Every 8 hours 02/16/24 1348 02/27/24 2322   02/16/24 0945  vancomycin  (VANCOREADY) IVPB 1500 mg/300 mL  Status:  Discontinued        1,500 mg 150 mL/hr over 120 Minutes Intravenous Every 12 hours 02/16/24 0848 02/20/24 2158   02/15/24 1215  Ampicillin -Sulbactam (UNASYN ) 3 g in sodium chloride  0.9 % 100 mL IVPB  Status:  Discontinued        3 g 200 mL/hr over 30 Minutes Intravenous Every 6 hours 02/15/24 1125 02/16/24 1226   01/24/24 2000  vancomycin  (VANCOREADY) IVPB 1250 mg/250 mL        1,250  mg 166.7 mL/hr over 90 Minutes Intravenous Every 24 hours 01/24/24 1154 01/27/24 0125   01/22/24 2200  Vancomycin  (VANCOCIN ) 1,250 mg in sodium chloride  0.9 % 250 mL IVPB  Status:  Discontinued        1,250 mg 166.7 mL/hr over 90 Minutes Intravenous Every 24 hours 01/22/24 1013 01/22/24 1020   01/22/24 2000  vancomycin  (VANCOREADY) IVPB 1250 mg/250 mL  Status:  Discontinued        1,250 mg 166.7 mL/hr over 90 Minutes Intravenous Every 24 hours 01/22/24 1020 01/24/24 1154   01/20/24 2000  ceFEPIme  (MAXIPIME ) 2 g in sodium chloride  0.9 % 100 mL IVPB  Status:  Discontinued        2 g 200 mL/hr over 30 Minutes Intravenous Every 8 hours 01/20/24 1900 01/24/24 1154   01/20/24 2000  Vancomycin  (VANCOCIN ) 1,250 mg in sodium chloride  0.9 % 250 mL IVPB  Status:  Discontinued        1,250 mg 166.7 mL/hr over 90 Minutes Intravenous Every 24 hours 01/20/24 1900 01/22/24 1013   01/03/24 1730  cefTRIAXone  (ROCEPHIN ) 1 g in sodium chloride  0.9 % 100 mL IVPB        1 g 200 mL/hr over 30  Minutes Intravenous Every 24 hours 01/03/24 1235 01/07/24 0710   01/02/24 1830  ceFEPIme  (MAXIPIME ) 2 g in sodium chloride  0.9 % 100 mL IVPB  Status:  Discontinued        2 g 200 mL/hr over 30 Minutes Intravenous Every 8 hours 01/02/24 1732 01/03/24 1235   01/02/24 1830  Vancomycin  (VANCOCIN ) 1,250 mg in sodium chloride  0.9 % 250 mL IVPB  Status:  Discontinued        1,250 mg 166.7 mL/hr over 90 Minutes Intravenous Every 24 hours 01/02/24 1732 01/04/24 1754   01/01/24 1200  cefTRIAXone  (ROCEPHIN ) 1 g in sodium chloride  0.9 % 100 mL IVPB  Status:  Discontinued        1 g 200 mL/hr over 30 Minutes Intravenous Every 24 hours 01/01/24 1057 01/02/24 1713   11/29/23 2200  linezolid  (ZYVOX ) tablet 600 mg        600 mg Per Tube Every 12 hours 11/29/23 1034 12/04/23 2215   11/28/23 1000  linezolid  (ZYVOX ) IVPB 600 mg  Status:  Discontinued        600 mg 300 mL/hr over 60 Minutes Intravenous Every 12 hours 11/28/23 0831 11/29/23 1034   11/23/23 1200  vancomycin  (VANCOREADY) IVPB 1250 mg/250 mL  Status:  Discontinued        1,250 mg 166.7 mL/hr over 90 Minutes Intravenous Every 24 hours 11/23/23 1036 11/28/23 0831   11/20/23 1100  vancomycin  (VANCOREADY) IVPB 1250 mg/250 mL  Status:  Discontinued        1,250 mg 166.7 mL/hr over 90 Minutes Intravenous Every 24 hours 11/20/23 0923 11/20/23 0923   11/20/23 1100  vancomycin  (VANCOREADY) IVPB 1250 mg/250 mL  Status:  Discontinued        1,250 mg 166.7 mL/hr over 90 Minutes Intravenous Every 24 hours 11/20/23 0923 11/23/23 1036   11/15/23 2300  vancomycin  (VANCOREADY) IVPB 1250 mg/250 mL        1,250 mg 166.7 mL/hr over 90 Minutes Intravenous Every 24 hours 11/15/23 2215 11/19/23 0924   11/12/23 2200  vancomycin  (VANCOREADY) IVPB 750 mg/150 mL  Status:  Discontinued        750 mg 150 mL/hr over 60 Minutes Intravenous Every 12 hours 11/12/23 0855 11/15/23 2215   11/12/23 0930  vancomycin  (  VANCOREADY) IVPB 2000 mg/400 mL        2,000 mg 200 mL/hr over  120 Minutes Intravenous  Once 11/12/23 0838 11/12/23 1116   11/11/23 1345  Ampicillin -Sulbactam (UNASYN ) 3 g in sodium chloride  0.9 % 100 mL IVPB  Status:  Discontinued        3 g 200 mL/hr over 30 Minutes Intravenous Every 6 hours 11/11/23 1257 11/12/23 0836   10/28/23 0400  Ampicillin -Sulbactam (UNASYN ) 3 g in sodium chloride  0.9 % 100 mL IVPB  Status:  Discontinued        3 g 200 mL/hr over 30 Minutes Intravenous Every 6 hours 10/27/23 2151 11/02/23 0945   10/27/23 1845  vancomycin  (VANCOCIN ) IVPB 1000 mg/200 mL premix  Status:  Discontinued        1,000 mg 200 mL/hr over 60 Minutes Intravenous  Once 10/27/23 1843 10/27/23 1843   10/27/23 1845  ceFEPIme  (MAXIPIME ) 2 g in sodium chloride  0.9 % 100 mL IVPB        2 g 200 mL/hr over 30 Minutes Intravenous  Once 10/27/23 1843 10/27/23 2024   10/27/23 1845  vancomycin  (VANCOREADY) IVPB 2000 mg/400 mL        2,000 mg 200 mL/hr over 120 Minutes Intravenous  Once 10/27/23 1843 10/27/23 2123        I have personally reviewed the following labs and images: CBC: Recent Labs  Lab 05/10/24 1652 05/11/24 0205 05/12/24 1258  WBC 10.5 9.9 10.9*  NEUTROABS 7.7  --   --   HGB 11.1* 10.0* 10.7*  HCT 38.1* 34.3* 36.8*  MCV 92.0 93.5 92.5  PLT 404* 367 364   BMP &GFR Recent Labs  Lab 05/10/24 1652 05/11/24 0205 05/12/24 1258  NA 148* 148* 143  K 4.1 4.2 3.6  CL 108 109 100  CO2 27 29 32  GLUCOSE 129* 115* 124*  BUN 27* 30* 23*  CREATININE 0.46* 0.49* 0.44*  CALCIUM 9.3 9.2 9.2  MG 2.3 2.3  --   PHOS  --  4.0  --    Estimated Creatinine Clearance: 126.3 mL/min (A) (by C-G formula based on SCr of 0.44 mg/dL (L)). Liver & Pancreas: Recent Labs  Lab 05/10/24 1652 05/11/24 0205 05/12/24 1258  AST 56* 53* 50*  ALT 107* 109* 115*  ALKPHOS 68 61 65  BILITOT 0.4 0.4 0.2  PROT 8.5* 7.5 7.9  ALBUMIN  3.3* 2.9* 3.0*   No results for input(s): LIPASE, AMYLASE in the last 168 hours. No results for input(s): AMMONIA in the last  168 hours. Diabetic: No results for input(s): HGBA1C in the last 72 hours. Recent Labs  Lab 05/12/24 1113 05/12/24 1757 05/13/24 0010 05/13/24 0536 05/13/24 1120  GLUCAP 132* 104* 115* 103* 85   Cardiac Enzymes: Recent Labs  Lab 05/12/24 1258  CKTOTAL 45*   No results for input(s): PROBNP in the last 8760 hours. Coagulation Profile: No results for input(s): INR, PROTIME in the last 168 hours. Thyroid Function Tests: No results for input(s): TSH, T4TOTAL, FREET4, T3FREE, THYROIDAB in the last 72 hours. Lipid Profile: No results for input(s): CHOL, HDL, LDLCALC, TRIG, CHOLHDL, LDLDIRECT in the last 72 hours. Anemia Panel: No results for input(s): VITAMINB12, FOLATE, FERRITIN, TIBC, IRON, RETICCTPCT in the last 72 hours. Urine analysis:    Component Value Date/Time   COLORURINE YELLOW 11/07/2023 0930   APPEARANCEUR CLEAR 11/07/2023 0930   LABSPEC 1.017 11/07/2023 0930   PHURINE 5.0 11/07/2023 0930   GLUCOSEU NEGATIVE 11/07/2023 0930   HGBUR NEGATIVE 11/07/2023  0930   BILIRUBINUR NEGATIVE 11/07/2023 0930   KETONESUR NEGATIVE 11/07/2023 0930   PROTEINUR NEGATIVE 11/07/2023 0930   NITRITE NEGATIVE 11/07/2023 0930   LEUKOCYTESUR NEGATIVE 11/07/2023 0930   Sepsis Labs: Invalid input(s): PROCALCITONIN, LACTICIDVEN  Microbiology: Recent Results (from the past 240 hours)  Culture, Respiratory w Gram Stain     Status: None   Collection Time: 05/10/24  2:56 PM   Specimen: Tracheal Aspirate; Respiratory  Result Value Ref Range Status   Specimen Description TRACHEAL ASPIRATE  Final   Special Requests NONE  Final   Gram Stain   Final    RARE WBC PRESENT, PREDOMINANTLY PMN MODERATE GRAM POSITIVE COCCI MODERATE GRAM NEGATIVE RODS FEW GRAM POSITIVE RODS Performed at [facility], [address] [place], [name] [address]    Culture ABUNDANT PSEUDOMONAS AERUGINOSA  Final   Report Status 05/12/2024 FINAL  Final   Organism  ID, Bacteria PSEUDOMONAS AERUGINOSA  Final      Susceptibility   Pseudomonas aeruginosa - MIC*    MEROPENEM 1 SENSITIVE Sensitive     CIPROFLOXACIN  0.25 SENSITIVE Sensitive     IMIPENEM 2 SENSITIVE Sensitive     PIP/TAZO Value in next row Sensitive      <=4 SENSITIVEThis is a modified FDA-approved test that has been validated and its performance characteristics determined by the reporting laboratory.  This laboratory is certified under the Clinical Laboratory Improvement Amendments CLIA as qualified to perform high complexity clinical laboratory testing.    CEFEPIME  Value in next row Sensitive      <=4 SENSITIVEThis is a modified FDA-approved test that has been validated and its performance characteristics determined by the reporting laboratory.  This laboratory is certified under the Clinical Laboratory Improvement Amendments CLIA as qualified to perform high complexity clinical laboratory testing.    CEFTAZIDIME/AVIBACTAM Value in next row Sensitive      <=4 SENSITIVEThis is a modified FDA-approved test that has been validated and its performance characteristics determined by the reporting laboratory.  This laboratory is certified under the Clinical Laboratory Improvement Amendments CLIA as qualified to perform high complexity clinical laboratory testing.    CEFTOLOZANE/TAZOBACTAM Value in next row Sensitive      <=4 SENSITIVEThis is a modified FDA-approved test that has been validated and its performance characteristics determined by the reporting laboratory.  This laboratory is certified under the Clinical Laboratory Improvement Amendments CLIA as qualified to perform high complexity clinical laboratory testing.    TOBRAMYCIN Value in next row Sensitive      <=4 SENSITIVEThis is a modified FDA-approved test that has been validated and its performance characteristics determined by the reporting laboratory.  This laboratory is certified under the Clinical Laboratory Improvement Amendments CLIA as  qualified to perform high complexity clinical laboratory testing.    CEFTAZIDIME Value in next row Sensitive      <=4 SENSITIVEThis is a modified FDA-approved test that has been validated and its performance characteristics determined by the reporting laboratory.  This laboratory is certified under the Clinical Laboratory Improvement Amendments CLIA as qualified to perform high complexity clinical laboratory testing.    * ABUNDANT PSEUDOMONAS AERUGINOSA  Culture, blood (Routine X 2) w Reflex to ID Panel     Status: None (Preliminary result)   Collection Time: 05/10/24  4:47 PM   Specimen: BLOOD RIGHT HAND  Result Value Ref Range Status   Specimen Description BLOOD RIGHT HAND  Final   Special Requests   Final    BOTTLES DRAWN AEROBIC AND ANAEROBIC Blood  Culture adequate volume   Culture   Final    NO GROWTH 3 DAYS Performed at [facility], [address], [place], [name] [address]    Report Status PENDING  Incomplete  Culture, blood (Routine X 2) w Reflex to ID Panel     Status: None (Preliminary result)   Collection Time: 05/10/24  4:52 PM   Specimen: BLOOD LEFT ARM  Result Value Ref Range Status   Specimen Description BLOOD LEFT ARM  Final   Special Requests   Final    BOTTLES DRAWN AEROBIC AND ANAEROBIC Blood Culture results may not be optimal due to an inadequate volume of blood received in culture bottles   Culture   Final    NO GROWTH 3 DAYS Performed at [facility], [address] [place], [name] [address]    Report Status PENDING  Incomplete    Radiology Studies: No results found.    Winston Sobczyk T. Crystal Scarberry [facility]  If 7PM-7AM, please contact night-coverage [email or web address] 05/13/2024, 4:58 PM

## 2024-05-14 DIAGNOSIS — Z7189 Other specified counseling: Secondary | ICD-10-CM | POA: Diagnosis not present

## 2024-05-14 DIAGNOSIS — J9601 Acute respiratory failure with hypoxia: Secondary | ICD-10-CM | POA: Diagnosis not present

## 2024-05-14 DIAGNOSIS — I619 Nontraumatic intracerebral hemorrhage, unspecified: Secondary | ICD-10-CM | POA: Diagnosis not present

## 2024-05-14 DIAGNOSIS — Z789 Other specified health status: Secondary | ICD-10-CM | POA: Diagnosis not present

## 2024-05-14 LAB — GLUCOSE, CAPILLARY
Glucose-Capillary: 128 mg/dL — ABNORMAL HIGH (ref 70–99)
Glucose-Capillary: 131 mg/dL — ABNORMAL HIGH (ref 70–99)
Glucose-Capillary: 138 mg/dL — ABNORMAL HIGH (ref 70–99)
Glucose-Capillary: 150 mg/dL — ABNORMAL HIGH (ref 70–99)
Glucose-Capillary: 90 mg/dL (ref 70–99)

## 2024-05-14 NOTE — Plan of Care (Signed)
  Problem: Nutrition: Goal: Dietary intake will improve Outcome: Progressing   Problem: Skin Integrity: Goal: Risk for impaired skin integrity will decrease Outcome: Progressing   Problem: Tissue Perfusion: Goal: Adequacy of tissue perfusion will improve Outcome: Progressing   Problem: Clinical Measurements: Goal: Ability to maintain clinical measurements within normal limits will improve 05/14/2024 0434 by Gerrit Selinda SQUIBB, RN Outcome: Progressing 05/14/2024 0053 by Gerrit Selinda SQUIBB, RN Outcome: Progressing Goal: Will remain free from infection 05/14/2024 0434 by Gerrit Selinda SQUIBB, RN Outcome: Progressing 05/14/2024 0053 by Gerrit Selinda SQUIBB, RN Outcome: Progressing Goal: Diagnostic test results will improve Outcome: Progressing Goal: Respiratory complications will improve 05/14/2024 0434 by Gerrit Selinda SQUIBB, RN Outcome: Progressing 05/14/2024 0053 by Gerrit Selinda SQUIBB, RN Outcome: Progressing Goal: Cardiovascular complication will be avoided Outcome: Progressing   Problem: Safety: Goal: Ability to remain free from injury will improve Outcome: Progressing   Problem: Skin Integrity: Goal: Risk for impaired skin integrity will decrease Outcome: Progressing   Problem: Respiratory: Goal: Patent airway maintenance will improve Outcome: Progressing   Problem: Respiratory: Goal: Ability to maintain a clear airway and adequate ventilation will improve Outcome: Progressing   Problem: Intracerebral Hemorrhage Tissue Perfusion: Goal: Complications of Intracerebral Hemorrhage will be minimized Outcome: Progressing   Problem: Coping: Goal: Will identify appropriate support needs Outcome: Progressing

## 2024-05-14 NOTE — Plan of Care (Signed)
  Problem: Clinical Measurements: Goal: Ability to maintain clinical measurements within normal limits will improve Outcome: Progressing Goal: Will remain free from infection Outcome: Progressing Goal: Respiratory complications will improve Outcome: Progressing Goal: Cardiovascular complication will be avoided Outcome: Progressing   Problem: Safety: Goal: Ability to remain free from injury will improve Outcome: Progressing   Problem: Skin Integrity: Goal: Risk for impaired skin integrity will decrease Outcome: Progressing   Problem: Respiratory: Goal: Patent airway maintenance will improve Outcome: Progressing   Problem: Respiratory: Goal: Ability to maintain a clear airway and adequate ventilation will improve Outcome: Progressing

## 2024-05-14 NOTE — Progress Notes (Signed)
 PROGRESS NOTE  Garrett [name] FMW:[id number] DOB: 1976-07-28   PCP: Pcp, No  Patient is from: Home  DOA: 10/27/2023 LOS: 200  Chief complaints Chief Complaint  Patient presents with   Unresponsive     Brief Narrative / Interim history: 48 y/o male with past medical history of hypertension was found down at home agonal breathing. History of uncontrolled hypertension but never had been to the doctor. Patient was noted to have a large (4.1 cm) hemorrhage centered in the pons with intraventricular extension into the fourth ventricle and also extension into the basal cisterns and trace additional subarachnoid hemorrhage along the left parietal convexity. BP in ED was 262/156. CXR revealed RUL and perihilar infiltrates suggesting aspiration pneumonia. ED labs also revealed PH 7.169, LA 4.4, CPK 985. Patient was intubated in ED and was admitted hospital.    Patient had prolonged complicated hospital stay pontine hemorrhage, biparietal SAH.  Patient's mental status poor, s/p tracheostomy and PEG placement.  Sputum cultures positive for MRSA on contact precautions.  Patient had prolonged difficulty weaning from vent.  Patient is on trach collar for few days but later required ventilatory support mostly at nights and is currently in ICU.  Subjective: No events overnight. Patient known to me from previous.  Last seen in June 2025.  Overall he seems to be about the same or worse considering he is vent dependent at times now.  Objective: Vitals:   05/14/24 1000 05/14/24 1134 05/14/24 1137 05/14/24 1536  BP: 119/86 114/85    Pulse: 82 81    Resp: 19 (!) 25    Temp:   98.2 F (36.8 C) 98.4 F (36.9 C)  TempSrc:   Axillary Axillary  SpO2: 95% 97%    Weight:      Height:        Examination: Constitutional:      Comments: Laying in bed appearing uncomfortable but no distress.  Occasionally opening eyes with no consistent meaningful purpose  HENT:     Head:     Mouth/Throat:     Mouth:  Mucous membranes are dry.    Eyes:     Comments: Intermittent right eye movement at times with no consistent tracking or blink reflex. Left eye opaque   Cardiovascular:     Rate and Rhythm: Normal rate and regular rhythm.  Pulmonary:     Comments: regular respi pattern Abdominal:     General: There is no distension.     Palpations: Abdomen is soft.    Musculoskeletal:        General: Normal range of motion.    Skin:    General: Skin is warm and dry.    Neurological:     Comments: Does not interact or follow commands.  Occasionally opens eyes with no meaningful purpose that can be repeated consistently. 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 -Continue supportive care-trach and vent and tube feed.  -On and off tachycardia, tachypnea noted. -Continue amlodipine , Lopressor  and as needed hydralazine . -Mental status continue to be poor.   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  Not a candidate for decannulation given ineffective airway clearance, -Continue trach collar during day with vent support at night. On and off tachypnea persists. -Continue trach care per protocol. -Pulmonary follow up appreciated.    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  Bowen evaluated and placed tarsorraphy.   -8/29-ophtho rec: Erythromycin  eyedrop q2h, tape eyes with paper tape if not blinking. -9/14, ophtho, Dr. Leane reengaged due to worsening conjunctivitis and clouding I recommended second antibiotic, gatifloxacin  4 times daily.per Optho, no epithelial defects noted.   SIRS: Blood cultures NGTD.  CXR unrevealing.  No leukocytosis.  Resp culture with Pseudomonas which could be just colonization since fever resolved without antibiotics.  No report of increased secretion. -Monitor  off antibiotics  Hypernatremia: Resolved. - Continue free water  at 150 cc every 6 hours. - Intermittently monitor labs  Elevated liver enzymes: Mild.  Unclear etiology.  CK normal. - Check intermittently.  Inadequate oral intake Body mass index is 31.51 kg/m. Nutrition Problem: Inadequate oral intake Etiology: inability to eat Signs/Symptoms: NPO status Interventions: Tube feeding, Prostat, MVI, Juven   DVT prophylaxis:  enoxaparin  (LOVENOX ) injection 40 mg Start: 03/06/24 1000 SCDs Start: 10/27/23 2226  Code Status: Full code Family Communication: None at bedside Level of care: ICU Status is: Inpatient Remains inpatient appropriate because: Intracranial hemorrhage, vegetative state   Final disposition: Unclear   55 minutes with more than 50% spent in reviewing records, counseling patient/family and coordinating care.   Sch Meds:  Scheduled Meds:  amLODipine   10 mg Per Tube Daily   Chlorhexidine  Gluconate Cloth  6 each Topical Daily   enoxaparin  (LOVENOX ) injection  40 mg Subcutaneous Daily   erythromycin    Both Eyes Q2H   famotidine   20 mg Per Tube BID   feeding supplement (JEVITY 1.5 CAL/FIBER)  237 mL Per Tube 5 X Daily   feeding supplement (PROSource TF20)  60 mL Per Tube TID   free water   150 mL Per Tube Q6H   gatifloxacin   1 drop Both Eyes QID   insulin  aspart  0-9 Units Subcutaneous Q6H   metoprolol  tartrate  75 mg Per Tube BID   multivitamin with minerals  1 tablet Per Tube Daily   mouth rinse  15 mL Mouth Rinse Q2H   polyethylene glycol  17 g Per Tube Daily   Continuous Infusions: PRN Meds:.acetaminophen  **OR** acetaminophen  (TYLENOL ) oral liquid 160 mg/5 mL **OR** acetaminophen , bisacodyl , Gerhardt's butt cream, hydrALAZINE , ipratropium-albuterol , mouth rinse, traZODone   Antimicrobials: Anti-infectives (From admission, onward)    Start     Dose/Rate Route Frequency Ordered Stop   04/09/24 2200  linezolid  (ZYVOX ) tablet 600 mg  Status:  Discontinued         600 mg Per Tube Every 12 hours 04/09/24 1132 04/13/24 1332   04/09/24 1700  vancomycin  (VANCOREADY) IVPB 1500 mg/300 mL  Status:  Discontinued        1,500 mg 150 mL/hr over 120 Minutes Intravenous Every 12 hours 04/09/24 1108 04/09/24 1132   04/09/24 1000  linezolid  (ZYVOX ) IVPB 600 mg  Status:  Discontinued        600 mg 300 mL/hr over 60 Minutes Intravenous Every 12 hours 04/09/24 0527 04/09/24 1108   04/01/24 1145  ceFEPIme  (MAXIPIME ) 2 g in sodium chloride  0.9 % 100 mL IVPB        2 g 200 mL/hr over 30 Minutes Intravenous Every 8 hours 04/01/24 1137 04/14/24 2230   02/21/24 1000  vancomycin  (VANCOREADY) IVPB 1250 mg/250 mL        1,250 mg 166.7 mL/hr over 90 Minutes Intravenous Every 12 hours 02/20/24 2158 02/28/24 0130   02/16/24 1445  ceFEPIme  (MAXIPIME ) 2 g in sodium chloride  0.9 % 100 mL IVPB        2 g  200 mL/hr over 30 Minutes Intravenous Every 8 hours 02/16/24 1348 02/27/24 2322   02/16/24 0945  vancomycin  (VANCOREADY) IVPB 1500 mg/300 mL  Status:  Discontinued        1,500 mg 150 mL/hr over 120 Minutes Intravenous Every 12 hours 02/16/24 0848 02/20/24 2158   02/15/24 1215  Ampicillin -Sulbactam (UNASYN ) 3 g in sodium chloride  0.9 % 100 mL IVPB  Status:  Discontinued        3 g 200 mL/hr over 30 Minutes Intravenous Every 6 hours 02/15/24 1125 02/16/24 1226   01/24/24 2000  vancomycin  (VANCOREADY) IVPB 1250 mg/250 mL        1,250 mg 166.7 mL/hr over 90 Minutes Intravenous Every 24 hours 01/24/24 1154 01/27/24 0125   01/22/24 2200  Vancomycin  (VANCOCIN ) 1,250 mg in sodium chloride  0.9 % 250 mL IVPB  Status:  Discontinued        1,250 mg 166.7 mL/hr over 90 Minutes Intravenous Every 24 hours 01/22/24 1013 01/22/24 1020   01/22/24 2000  vancomycin  (VANCOREADY) IVPB 1250 mg/250 mL  Status:  Discontinued        1,250 mg 166.7 mL/hr over 90 Minutes Intravenous Every 24 hours 01/22/24 1020 01/24/24 1154   01/20/24 2000  ceFEPIme  (MAXIPIME ) 2 g in sodium chloride  0.9 % 100 mL  IVPB  Status:  Discontinued        2 g 200 mL/hr over 30 Minutes Intravenous Every 8 hours 01/20/24 1900 01/24/24 1154   01/20/24 2000  Vancomycin  (VANCOCIN ) 1,250 mg in sodium chloride  0.9 % 250 mL IVPB  Status:  Discontinued        1,250 mg 166.7 mL/hr over 90 Minutes Intravenous Every 24 hours 01/20/24 1900 01/22/24 1013   01/03/24 1730  cefTRIAXone  (ROCEPHIN ) 1 g in sodium chloride  0.9 % 100 mL IVPB        1 g 200 mL/hr over 30 Minutes Intravenous Every 24 hours 01/03/24 1235 01/07/24 0710   01/02/24 1830  ceFEPIme  (MAXIPIME ) 2 g in sodium chloride  0.9 % 100 mL IVPB  Status:  Discontinued        2 g 200 mL/hr over 30 Minutes Intravenous Every 8 hours 01/02/24 1732 01/03/24 1235   01/02/24 1830  Vancomycin  (VANCOCIN ) 1,250 mg in sodium chloride  0.9 % 250 mL IVPB  Status:  Discontinued        1,250 mg 166.7 mL/hr over 90 Minutes Intravenous Every 24 hours 01/02/24 1732 01/04/24 1754   01/01/24 1200  cefTRIAXone  (ROCEPHIN ) 1 g in sodium chloride  0.9 % 100 mL IVPB  Status:  Discontinued        1 g 200 mL/hr over 30 Minutes Intravenous Every 24 hours 01/01/24 1057 01/02/24 1713   11/29/23 2200  linezolid  (ZYVOX ) tablet 600 mg        600 mg Per Tube Every 12 hours 11/29/23 1034 12/04/23 2215   11/28/23 1000  linezolid  (ZYVOX ) IVPB 600 mg  Status:  Discontinued        600 mg 300 mL/hr over 60 Minutes Intravenous Every 12 hours 11/28/23 0831 11/29/23 1034   11/23/23 1200  vancomycin  (VANCOREADY) IVPB 1250 mg/250 mL  Status:  Discontinued        1,250 mg 166.7 mL/hr over 90 Minutes Intravenous Every 24 hours 11/23/23 1036 11/28/23 0831   11/20/23 1100  vancomycin  (VANCOREADY) IVPB 1250 mg/250 mL  Status:  Discontinued        1,250 mg 166.7 mL/hr over 90 Minutes Intravenous Every 24 hours 11/20/23 0923 11/20/23 9076  11/20/23 1100  vancomycin  (VANCOREADY) IVPB 1250 mg/250 mL  Status:  Discontinued        1,250 mg 166.7 mL/hr over 90 Minutes Intravenous Every 24 hours 11/20/23 0923 11/23/23  1036   11/15/23 2300  vancomycin  (VANCOREADY) IVPB 1250 mg/250 mL        1,250 mg 166.7 mL/hr over 90 Minutes Intravenous Every 24 hours 11/15/23 2215 11/19/23 0924   11/12/23 2200  vancomycin  (VANCOREADY) IVPB 750 mg/150 mL  Status:  Discontinued        750 mg 150 mL/hr over 60 Minutes Intravenous Every 12 hours 11/12/23 0855 11/15/23 2215   11/12/23 0930  vancomycin  (VANCOREADY) IVPB 2000 mg/400 mL        2,000 mg 200 mL/hr over 120 Minutes Intravenous  Once 11/12/23 0838 11/12/23 1116   11/11/23 1345  Ampicillin -Sulbactam (UNASYN ) 3 g in sodium chloride  0.9 % 100 mL IVPB  Status:  Discontinued        3 g 200 mL/hr over 30 Minutes Intravenous Every 6 hours 11/11/23 1257 11/12/23 0836   10/28/23 0400  Ampicillin -Sulbactam (UNASYN ) 3 g in sodium chloride  0.9 % 100 mL IVPB  Status:  Discontinued        3 g 200 mL/hr over 30 Minutes Intravenous Every 6 hours 10/27/23 2151 11/02/23 0945   10/27/23 1845  vancomycin  (VANCOCIN ) IVPB 1000 mg/200 mL premix  Status:  Discontinued        1,000 mg 200 mL/hr over 60 Minutes Intravenous  Once 10/27/23 1843 10/27/23 1843   10/27/23 1845  ceFEPIme  (MAXIPIME ) 2 g in sodium chloride  0.9 % 100 mL IVPB        2 g 200 mL/hr over 30 Minutes Intravenous  Once 10/27/23 1843 10/27/23 2024   10/27/23 1845  vancomycin  (VANCOREADY) IVPB 2000 mg/400 mL        2,000 mg 200 mL/hr over 120 Minutes Intravenous  Once 10/27/23 1843 10/27/23 2123        I have personally reviewed the following labs and images: CBC: Recent Labs  Lab 05/10/24 1652 05/11/24 0205 05/12/24 1258  WBC 10.5 9.9 10.9*  NEUTROABS 7.7  --   --   HGB 11.1* 10.0* 10.7*  HCT 38.1* 34.3* 36.8*  MCV 92.0 93.5 92.5  PLT 404* 367 364   BMP &GFR Recent Labs  Lab 05/10/24 1652 05/11/24 0205 05/12/24 1258  NA 148* 148* 143  K 4.1 4.2 3.6  CL 108 109 100  CO2 27 29 32  GLUCOSE 129* 115* 124*  BUN 27* 30* 23*  CREATININE 0.46* 0.49* 0.44*  CALCIUM 9.3 9.2 9.2  MG 2.3 2.3  --   PHOS   --  4.0  --    Estimated Creatinine Clearance: 126.9 mL/min (A) (by C-G formula based on SCr of 0.44 mg/dL (L)). Liver & Pancreas: Recent Labs  Lab 05/10/24 1652 05/11/24 0205 05/12/24 1258  AST 56* 53* 50*  ALT 107* 109* 115*  ALKPHOS 68 61 65  BILITOT 0.4 0.4 0.2  PROT 8.5* 7.5 7.9  ALBUMIN  3.3* 2.9* 3.0*   No results for input(s): LIPASE, AMYLASE in the last 168 hours. No results for input(s): AMMONIA in the last 168 hours. Diabetic: No results for input(s): HGBA1C in the last 72 hours. Recent Labs  Lab 05/13/24 1120 05/13/24 1802 05/14/24 0021 05/14/24 0556 05/14/24 1138  GLUCAP 85 99 150* 90 128*   Cardiac Enzymes: Recent Labs  Lab 05/12/24 1258  CKTOTAL 45*   No results for input(s): PROBNP in  the last 8760 hours. Coagulation Profile: No results for input(s): INR, PROTIME in the last 168 hours. Thyroid Function Tests: No results for input(s): TSH, T4TOTAL, FREET4, T3FREE, THYROIDAB in the last 72 hours. Lipid Profile: No results for input(s): CHOL, HDL, LDLCALC, TRIG, CHOLHDL, LDLDIRECT in the last 72 hours. Anemia Panel: No results for input(s): VITAMINB12, FOLATE, FERRITIN, TIBC, IRON, RETICCTPCT in the last 72 hours. Urine analysis:    Component Value Date/Time   COLORURINE YELLOW 11/07/2023 0930   APPEARANCEUR CLEAR 11/07/2023 0930   LABSPEC 1.017 11/07/2023 0930   PHURINE 5.0 11/07/2023 0930   GLUCOSEU NEGATIVE 11/07/2023 0930   HGBUR NEGATIVE 11/07/2023 0930   BILIRUBINUR NEGATIVE 11/07/2023 0930   KETONESUR NEGATIVE 11/07/2023 0930   PROTEINUR NEGATIVE 11/07/2023 0930   NITRITE NEGATIVE 11/07/2023 0930   LEUKOCYTESUR NEGATIVE 11/07/2023 0930   Sepsis Labs: Invalid input(s): PROCALCITONIN, LACTICIDVEN  Microbiology: Recent Results (from the past 240 hours)  Culture, Respiratory w Gram Stain     Status: None   Collection Time: 05/10/24  2:56 PM   Specimen: Tracheal Aspirate; Respiratory   Result Value Ref Range Status   Specimen Description TRACHEAL ASPIRATE  Final   Special Requests NONE  Final   Gram Stain   Final    RARE WBC PRESENT, PREDOMINANTLY PMN MODERATE GRAM POSITIVE COCCI MODERATE GRAM NEGATIVE RODS FEW GRAM POSITIVE RODS Performed at [facility], [address] [place], [name] [address]    Culture ABUNDANT PSEUDOMONAS AERUGINOSA  Final   Report Status 05/12/2024 FINAL  Final   Organism ID, Bacteria PSEUDOMONAS AERUGINOSA  Final      Susceptibility   Pseudomonas aeruginosa - MIC*    MEROPENEM 1 SENSITIVE Sensitive     CIPROFLOXACIN  0.25 SENSITIVE Sensitive     IMIPENEM 2 SENSITIVE Sensitive     PIP/TAZO Value in next row Sensitive      <=4 SENSITIVEThis is a modified FDA-approved test that has been validated and its performance characteristics determined by the reporting laboratory.  This laboratory is certified under the Clinical Laboratory Improvement Amendments CLIA as qualified to perform high complexity clinical laboratory testing.    CEFEPIME  Value in next row Sensitive      <=4 SENSITIVEThis is a modified FDA-approved test that has been validated and its performance characteristics determined by the reporting laboratory.  This laboratory is certified under the Clinical Laboratory Improvement Amendments CLIA as qualified to perform high complexity clinical laboratory testing.    CEFTAZIDIME/AVIBACTAM Value in next row Sensitive      <=4 SENSITIVEThis is a modified FDA-approved test that has been validated and its performance characteristics determined by the reporting laboratory.  This laboratory is certified under the Clinical Laboratory Improvement Amendments CLIA as qualified to perform high complexity clinical laboratory testing.    CEFTOLOZANE/TAZOBACTAM Value in next row Sensitive      <=4 SENSITIVEThis is a modified FDA-approved test that has been validated and its performance characteristics determined by the reporting laboratory.  This  laboratory is certified under the Clinical Laboratory Improvement Amendments CLIA as qualified to perform high complexity clinical laboratory testing.    TOBRAMYCIN Value in next row Sensitive      <=4 SENSITIVEThis is a modified FDA-approved test that has been validated and its performance characteristics determined by the reporting laboratory.  This laboratory is certified under the Clinical Laboratory Improvement Amendments CLIA as qualified to perform high complexity clinical laboratory testing.    CEFTAZIDIME Value in next row Sensitive      <=4  SENSITIVEThis is a modified FDA-approved test that has been validated and its performance characteristics determined by the reporting laboratory.  This laboratory is certified under the Clinical Laboratory Improvement Amendments CLIA as qualified to perform high complexity clinical laboratory testing.    * ABUNDANT PSEUDOMONAS AERUGINOSA  Culture, blood (Routine X 2) w Reflex to ID Panel     Status: None (Preliminary result)   Collection Time: 05/10/24  4:47 PM   Specimen: BLOOD RIGHT HAND  Result Value Ref Range Status   Specimen Description BLOOD RIGHT HAND  Final   Special Requests   Final    BOTTLES DRAWN AEROBIC AND ANAEROBIC Blood Culture adequate volume   Culture   Final    NO GROWTH 4 DAYS Performed at [facility], [address], [place], [name] [address]    Report Status PENDING  Incomplete  Culture, blood (Routine X 2) w Reflex to ID Panel     Status: None (Preliminary result)   Collection Time: 05/10/24  4:52 PM   Specimen: BLOOD LEFT ARM  Result Value Ref Range Status   Specimen Description BLOOD LEFT ARM  Final   Special Requests   Final    BOTTLES DRAWN AEROBIC AND ANAEROBIC Blood Culture results may not be optimal due to an inadequate volume of blood received in culture bottles   Culture   Final    NO GROWTH 4 DAYS Performed at [facility], [address] [place], [name] [address]    Report Status PENDING   Incomplete    Radiology Studies: No results found.  Alm Apo, MD [facility] 05/14/2024, 3:53 PM  If 7PM-7AM, please contact night-coverage [email or web address] 05/14/2024, 3:49 PM

## 2024-05-14 NOTE — Plan of Care (Signed)
  Problem: Nutrition: Goal: Adequate nutrition will be maintained Outcome: Progressing   Problem: Safety: Goal: Ability to remain free from injury will improve Outcome: Progressing   Problem: Skin Integrity: Goal: Risk for impaired skin integrity will decrease Outcome: Progressing   Problem: Respiratory: Goal: Patent airway maintenance will improve Outcome: Progressing   Problem: Respiratory: Goal: Ability to maintain a clear airway and adequate ventilation will improve Outcome: Progressing   Problem: Education: Goal: Knowledge of disease or condition will improve Outcome: Progressing

## 2024-05-14 NOTE — Plan of Care (Signed)
  Problem: Intracerebral Hemorrhage Tissue Perfusion: Goal: Complications of Intracerebral Hemorrhage will be minimized Outcome: Progressing   Problem: Nutrition: Goal: Risk of aspiration will decrease Outcome: Progressing Goal: Dietary intake will improve Outcome: Progressing   Problem: Education: Goal: Knowledge of disease or condition will improve Outcome: Progressing   Problem: Intracerebral Hemorrhage Tissue Perfusion: Goal: Complications of Intracerebral Hemorrhage will be minimized Outcome: Progressing

## 2024-05-15 ENCOUNTER — Inpatient Hospital Stay (HOSPITAL_COMMUNITY)

## 2024-05-15 DIAGNOSIS — Z789 Other specified health status: Secondary | ICD-10-CM | POA: Diagnosis not present

## 2024-05-15 DIAGNOSIS — Z7189 Other specified counseling: Secondary | ICD-10-CM | POA: Diagnosis not present

## 2024-05-15 DIAGNOSIS — J9611 Chronic respiratory failure with hypoxia: Secondary | ICD-10-CM | POA: Diagnosis not present

## 2024-05-15 DIAGNOSIS — I619 Nontraumatic intracerebral hemorrhage, unspecified: Secondary | ICD-10-CM | POA: Diagnosis not present

## 2024-05-15 HISTORY — PX: IR REPLACE G-TUBE SIMPLE WO FLUORO: IMG2323

## 2024-05-15 LAB — CULTURE, BLOOD (ROUTINE X 2)
Culture: NO GROWTH
Culture: NO GROWTH
Special Requests: ADEQUATE

## 2024-05-15 MED ORDER — HYDRALAZINE HCL 20 MG/ML IJ SOLN
10.0000 mg | INTRAMUSCULAR | Status: DC | PRN
Start: 2024-05-15 — End: 2024-06-21
  Filled 2024-05-15 (×2): qty 1

## 2024-05-15 MED ORDER — ARTIFICIAL TEARS OPHTHALMIC OINT
TOPICAL_OINTMENT | Freq: Two times a day (BID) | OPHTHALMIC | Status: DC
  Administered 2024-05-26 – 2024-06-01 (×7): 1 via OPHTHALMIC
  Administered 2024-06-04: 4 via OPHTHALMIC
  Administered 2024-06-08 – 2024-06-19 (×6): 1 via OPHTHALMIC
  Filled 2024-05-15 (×12): qty 3.5

## 2024-05-15 MED ORDER — DIATRIZOATE MEGLUMINE & SODIUM 66-10 % PO SOLN: 25.0000 mL | Freq: Once | ORAL | Status: DC

## 2024-05-15 MED ORDER — DIATRIZOATE MEGLUMINE & SODIUM 66-10 % PO SOLN
25.0000 mL | Freq: Once | ORAL | Status: AC
  Administered 2024-05-15: 25 mL
  Filled 2024-05-15: qty 30

## 2024-05-15 MED ORDER — DIATRIZOATE MEGLUMINE & SODIUM 66-10 % PO SOLN
ORAL | Status: AC
  Filled 2024-05-15: qty 30

## 2024-05-15 NOTE — Plan of Care (Signed)
 Replaced PEG, UOP: .  Problem: Nutrition: Goal: Risk of aspiration will decrease Outcome: Progressing   Problem: Skin Integrity: Goal: Risk for impaired skin integrity will decrease Outcome: Progressing   Problem: Intracerebral Hemorrhage Tissue Perfusion: Goal: Complications of Intracerebral Hemorrhage will be minimized Outcome: Not Progressing   Problem: Education: Goal: Knowledge of disease or condition will improve Outcome: Not Progressing   Problem: Self-Care: Goal: Ability to participate in self-care as condition permits will improve Outcome: Not Progressing

## 2024-05-15 NOTE — Progress Notes (Signed)
 PROGRESS NOTE  Garrett [name] FMW:[id number] DOB: 1975/11/18   PCP: Pcp, No  Patient is from: Home  DOA: 10/27/2023 LOS: 201  Chief complaints Chief Complaint  Patient presents with   Unresponsive     Brief Narrative / Interim history: 48 y/o male with past medical history of hypertension was found down at home agonal breathing. History of uncontrolled hypertension but never had been to the doctor. Patient was noted to have a large (4.1 cm) hemorrhage centered in the pons with intraventricular extension into the fourth ventricle and also extension into the basal cisterns and trace additional subarachnoid hemorrhage along the left parietal convexity. BP in ED was 262/156. CXR revealed RUL and perihilar infiltrates suggesting aspiration pneumonia. ED labs also revealed PH 7.169, LA 4.4, CPK 985. Patient was intubated in ED and was admitted hospital.    Patient had prolonged complicated hospital stay pontine hemorrhage, biparietal SAH.  Patient's mental status poor, s/p tracheostomy and PEG placement.  Sputum cultures positive for MRSA on contact precautions.  Patient had prolonged difficulty weaning from vent.  Patient is on trach collar for few days but later required ventilatory support mostly at nights and is currently in ICU.  Subjective: No events overnight. Patient known to me from previous.  Last seen in June 2025.  Overall he seems to be about the same or worse considering he is vent dependent at times now. PEG dislodged again. Consulting IR to help replace.   Objective: Vitals:   05/15/24 0800 05/15/24 1034 05/15/24 1100 05/15/24 1200  BP: (!) 130/92 (!) 130/90 (!) 131/96 (!) 133/100  Pulse: 93  88 92  Resp: (!) 25  (!) 28 (!) 26  Temp:    98.4 F (36.9 C)  TempSrc:    Axillary  SpO2: 95%  97% 95%  Weight:      Height:        Examination: Constitutional:      Comments: Laying in bed appearing uncomfortable but no distress.  Occasionally opening eyes with no  consistent meaningful purpose  HENT:     Head:     Mouth/Throat:     Mouth: Mucous membranes are dry.  Eyes:     Comments: Intermittent right eye movement at times with no consistent tracking or blink reflex. Left eye opaque Cardiovascular:     Rate and Rhythm: Normal rate and regular rhythm.  Pulmonary:     Comments: regular respi pattern Abdominal:     General: There is no distension.     Palpations: Abdomen is soft.  Musculoskeletal:        General: Normal range of motion. Skin:    General: Skin is warm and dry.  Neurological:     Comments: Does not interact or follow commands.  Occasionally opens eyes with no meaningful purpose that can be repeated consistently. 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 -Continue supportive care-trach and vent and tube feed.  -On and off tachycardia, tachypnea noted. -Continue amlodipine , Lopressor  and as needed hydralazine . -Mental status continue to be poor.   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  Not a candidate for decannulation given ineffective airway clearance, -Continue trach collar during day with vent support at night. On and off tachypnea persists. -Continue trach care per protocol. -Pulmonary follow up appreciated.    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 Waylan evaluated and placed tarsorraphy.   -8/29-ophtho rec: Erythromycin  eyedrop q2h, tape eyes with paper tape if not blinking. -9/14, ophtho, Dr. Leane reengaged due to worsening conjunctivitis and clouding I recommended second antibiotic, gatifloxacin  4 times daily.per Optho, no epithelial defects noted. - 9/25: courses completed of abx drops; continue on opth ointment and cover left eye with patch to keep lid closed    SIRS: Blood cultures NGTD.  CXR  unrevealing.  No leukocytosis.  Resp culture with Pseudomonas which could be just colonization since fever resolved without antibiotics.  No report of increased secretion. -Monitor off antibiotics  Hypernatremia: Resolved. - Continue free water  at 150 cc every 6 hours. - Intermittently monitor labs  Elevated liver enzymes: Mild.  Unclear etiology.  CK normal. - Check intermittently.  Inadequate oral intake Body mass index is 32.18 kg/m. Nutrition Problem: Inadequate oral intake Etiology: inability to eat Signs/Symptoms: NPO status Interventions: Tube feeding, Prostat, MVI, Juven   DVT prophylaxis:  enoxaparin  (LOVENOX ) injection 40 mg Start: 03/06/24 1000 SCDs Start: 10/27/23 2226  Code Status: Full code Family Communication: None at bedside Level of care: ICU Status is: Inpatient Remains inpatient appropriate because: Intracranial hemorrhage, vegetative state   Final disposition: Unclear   55 minutes with more than 50% spent in reviewing records, counseling patient/family and coordinating care.   Sch Meds:  Scheduled Meds:  amLODipine   10 mg Per Tube Daily   artificial tears   Both Eyes BID   Chlorhexidine  Gluconate Cloth  6 each Topical Daily   enoxaparin  (LOVENOX ) injection  40 mg Subcutaneous Daily   famotidine   20 mg Per Tube BID   feeding supplement (JEVITY 1.5 CAL/FIBER)  237 mL Per Tube 5 X Daily   feeding supplement (PROSource TF20)  60 mL Per Tube TID   free water   150 mL Per Tube Q6H   insulin  aspart  0-9 Units Subcutaneous Q6H   metoprolol  tartrate  75 mg Per Tube BID   multivitamin with minerals  1 tablet Per Tube Daily   mouth rinse  15 mL Mouth Rinse Q2H   polyethylene glycol  17 g Per Tube Daily   Continuous Infusions: PRN Meds:.acetaminophen  **OR** acetaminophen  (TYLENOL ) oral liquid 160 mg/5 mL **OR** acetaminophen , bisacodyl , Gerhardt's butt cream, hydrALAZINE , ipratropium-albuterol , mouth rinse, traZODone   Antimicrobials: Anti-infectives  (From admission, onward)    Start     Dose/Rate Route Frequency Ordered Stop   04/09/24 2200  linezolid  (ZYVOX ) tablet 600 mg  Status:  Discontinued        600 mg Per Tube Every 12 hours 04/09/24 1132 04/13/24 1332   04/09/24 1700  vancomycin  (VANCOREADY) IVPB 1500 mg/300 mL  Status:  Discontinued        1,500 mg 150 mL/hr over 120 Minutes Intravenous Every 12 hours 04/09/24 1108 04/09/24 1132   04/09/24 1000  linezolid  (ZYVOX ) IVPB 600 mg  Status:  Discontinued        600 mg 300 mL/hr over 60 Minutes Intravenous Every 12 hours 04/09/24 0527 04/09/24 1108   04/01/24 1145  ceFEPIme  (MAXIPIME ) 2 g in sodium chloride  0.9 % 100 mL IVPB        2 g 200 mL/hr over 30 Minutes Intravenous Every 8 hours 04/01/24 1137 04/14/24 2230   02/21/24 1000  vancomycin  (VANCOREADY) IVPB 1250 mg/250 mL        1,250 mg 166.7 mL/hr over 90 Minutes Intravenous Every 12 hours 02/20/24 2158 02/28/24 0130   02/16/24 1445  ceFEPIme  (MAXIPIME ) 2 g in  sodium chloride  0.9 % 100 mL IVPB        2 g 200 mL/hr over 30 Minutes Intravenous Every 8 hours 02/16/24 1348 02/27/24 2322   02/16/24 0945  vancomycin  (VANCOREADY) IVPB 1500 mg/300 mL  Status:  Discontinued        1,500 mg 150 mL/hr over 120 Minutes Intravenous Every 12 hours 02/16/24 0848 02/20/24 2158   02/15/24 1215  Ampicillin -Sulbactam (UNASYN ) 3 g in sodium chloride  0.9 % 100 mL IVPB  Status:  Discontinued        3 g 200 mL/hr over 30 Minutes Intravenous Every 6 hours 02/15/24 1125 02/16/24 1226   01/24/24 2000  vancomycin  (VANCOREADY) IVPB 1250 mg/250 mL        1,250 mg 166.7 mL/hr over 90 Minutes Intravenous Every 24 hours 01/24/24 1154 01/27/24 0125   01/22/24 2200  Vancomycin  (VANCOCIN ) 1,250 mg in sodium chloride  0.9 % 250 mL IVPB  Status:  Discontinued        1,250 mg 166.7 mL/hr over 90 Minutes Intravenous Every 24 hours 01/22/24 1013 01/22/24 1020   01/22/24 2000  vancomycin  (VANCOREADY) IVPB 1250 mg/250 mL  Status:  Discontinued        1,250  mg 166.7 mL/hr over 90 Minutes Intravenous Every 24 hours 01/22/24 1020 01/24/24 1154   01/20/24 2000  ceFEPIme  (MAXIPIME ) 2 g in sodium chloride  0.9 % 100 mL IVPB  Status:  Discontinued        2 g 200 mL/hr over 30 Minutes Intravenous Every 8 hours 01/20/24 1900 01/24/24 1154   01/20/24 2000  Vancomycin  (VANCOCIN ) 1,250 mg in sodium chloride  0.9 % 250 mL IVPB  Status:  Discontinued        1,250 mg 166.7 mL/hr over 90 Minutes Intravenous Every 24 hours 01/20/24 1900 01/22/24 1013   01/03/24 1730  cefTRIAXone  (ROCEPHIN ) 1 g in sodium chloride  0.9 % 100 mL IVPB        1 g 200 mL/hr over 30 Minutes Intravenous Every 24 hours 01/03/24 1235 01/07/24 0710   01/02/24 1830  ceFEPIme  (MAXIPIME ) 2 g in sodium chloride  0.9 % 100 mL IVPB  Status:  Discontinued        2 g 200 mL/hr over 30 Minutes Intravenous Every 8 hours 01/02/24 1732 01/03/24 1235   01/02/24 1830  Vancomycin  (VANCOCIN ) 1,250 mg in sodium chloride  0.9 % 250 mL IVPB  Status:  Discontinued        1,250 mg 166.7 mL/hr over 90 Minutes Intravenous Every 24 hours 01/02/24 1732 01/04/24 1754   01/01/24 1200  cefTRIAXone  (ROCEPHIN ) 1 g in sodium chloride  0.9 % 100 mL IVPB  Status:  Discontinued        1 g 200 mL/hr over 30 Minutes Intravenous Every 24 hours 01/01/24 1057 01/02/24 1713   11/29/23 2200  linezolid  (ZYVOX ) tablet 600 mg        600 mg Per Tube Every 12 hours 11/29/23 1034 12/04/23 2215   11/28/23 1000  linezolid  (ZYVOX ) IVPB 600 mg  Status:  Discontinued        600 mg 300 mL/hr over 60 Minutes Intravenous Every 12 hours 11/28/23 0831 11/29/23 1034   11/23/23 1200  vancomycin  (VANCOREADY) IVPB 1250 mg/250 mL  Status:  Discontinued        1,250 mg 166.7 mL/hr over 90 Minutes Intravenous Every 24 hours 11/23/23 1036 11/28/23 0831   11/20/23 1100  vancomycin  (VANCOREADY) IVPB 1250 mg/250 mL  Status:  Discontinued  1,250 mg 166.7 mL/hr over 90 Minutes Intravenous Every 24 hours 11/20/23 0923 11/20/23 0923   11/20/23 1100   vancomycin  (VANCOREADY) IVPB 1250 mg/250 mL  Status:  Discontinued        1,250 mg 166.7 mL/hr over 90 Minutes Intravenous Every 24 hours 11/20/23 0923 11/23/23 1036   11/15/23 2300  vancomycin  (VANCOREADY) IVPB 1250 mg/250 mL        1,250 mg 166.7 mL/hr over 90 Minutes Intravenous Every 24 hours 11/15/23 2215 11/19/23 0924   11/12/23 2200  vancomycin  (VANCOREADY) IVPB 750 mg/150 mL  Status:  Discontinued        750 mg 150 mL/hr over 60 Minutes Intravenous Every 12 hours 11/12/23 0855 11/15/23 2215   11/12/23 0930  vancomycin  (VANCOREADY) IVPB 2000 mg/400 mL        2,000 mg 200 mL/hr over 120 Minutes Intravenous  Once 11/12/23 0838 11/12/23 1116   11/11/23 1345  Ampicillin -Sulbactam (UNASYN ) 3 g in sodium chloride  0.9 % 100 mL IVPB  Status:  Discontinued        3 g 200 mL/hr over 30 Minutes Intravenous Every 6 hours 11/11/23 1257 11/12/23 0836   10/28/23 0400  Ampicillin -Sulbactam (UNASYN ) 3 g in sodium chloride  0.9 % 100 mL IVPB  Status:  Discontinued        3 g 200 mL/hr over 30 Minutes Intravenous Every 6 hours 10/27/23 2151 11/02/23 0945   10/27/23 1845  vancomycin  (VANCOCIN ) IVPB 1000 mg/200 mL premix  Status:  Discontinued        1,000 mg 200 mL/hr over 60 Minutes Intravenous  Once 10/27/23 1843 10/27/23 1843   10/27/23 1845  ceFEPIme  (MAXIPIME ) 2 g in sodium chloride  0.9 % 100 mL IVPB        2 g 200 mL/hr over 30 Minutes Intravenous  Once 10/27/23 1843 10/27/23 2024   10/27/23 1845  vancomycin  (VANCOREADY) IVPB 2000 mg/400 mL        2,000 mg 200 mL/hr over 120 Minutes Intravenous  Once 10/27/23 1843 10/27/23 2123        I have personally reviewed the following labs and images: CBC: Recent Labs  Lab 05/10/24 1652 05/11/24 0205 05/12/24 1258  WBC 10.5 9.9 10.9*  NEUTROABS 7.7  --   --   HGB 11.1* 10.0* 10.7*  HCT 38.1* 34.3* 36.8*  MCV 92.0 93.5 92.5  PLT 404* 367 364   BMP &GFR Recent Labs  Lab 05/10/24 1652 05/11/24 0205 05/12/24 1258  NA 148* 148* 143  K  4.1 4.2 3.6  CL 108 109 100  CO2 27 29 32  GLUCOSE 129* 115* 124*  BUN 27* 30* 23*  CREATININE 0.46* 0.49* 0.44*  CALCIUM 9.3 9.2 9.2  MG 2.3 2.3  --   PHOS  --  4.0  --    Estimated Creatinine Clearance: 128.2 mL/min (A) (by C-G formula based on SCr of 0.44 mg/dL (L)). Liver & Pancreas: Recent Labs  Lab 05/10/24 1652 05/11/24 0205 05/12/24 1258  AST 56* 53* 50*  ALT 107* 109* 115*  ALKPHOS 68 61 65  BILITOT 0.4 0.4 0.2  PROT 8.5* 7.5 7.9  ALBUMIN  3.3* 2.9* 3.0*   No results for input(s): LIPASE, AMYLASE in the last 168 hours. No results for input(s): AMMONIA in the last 168 hours. Diabetic: No results for input(s): HGBA1C in the last 72 hours. Recent Labs  Lab 05/14/24 0021 05/14/24 0556 05/14/24 1138 05/14/24 1740 05/14/24 2331  GLUCAP 150* 90 128* 138* 131*   Cardiac Enzymes:  Recent Labs  Lab 05/12/24 1258  CKTOTAL 45*   No results for input(s): PROBNP in the last 8760 hours. Coagulation Profile: No results for input(s): INR, PROTIME in the last 168 hours. Thyroid Function Tests: No results for input(s): TSH, T4TOTAL, FREET4, T3FREE, THYROIDAB in the last 72 hours. Lipid Profile: No results for input(s): CHOL, HDL, LDLCALC, TRIG, CHOLHDL, LDLDIRECT in the last 72 hours. Anemia Panel: No results for input(s): VITAMINB12, FOLATE, FERRITIN, TIBC, IRON, RETICCTPCT in the last 72 hours. Urine analysis:    Component Value Date/Time   COLORURINE YELLOW 11/07/2023 0930   APPEARANCEUR CLEAR 11/07/2023 0930   LABSPEC 1.017 11/07/2023 0930   PHURINE 5.0 11/07/2023 0930   GLUCOSEU NEGATIVE 11/07/2023 0930   HGBUR NEGATIVE 11/07/2023 0930   BILIRUBINUR NEGATIVE 11/07/2023 0930   KETONESUR NEGATIVE 11/07/2023 0930   PROTEINUR NEGATIVE 11/07/2023 0930   NITRITE NEGATIVE 11/07/2023 0930   LEUKOCYTESUR NEGATIVE 11/07/2023 0930   Sepsis Labs: Invalid input(s): PROCALCITONIN, LACTICIDVEN  Microbiology: Recent  Results (from the past 240 hours)  Culture, Respiratory w Gram Stain     Status: None   Collection Time: 05/10/24  2:56 PM   Specimen: Tracheal Aspirate; Respiratory  Result Value Ref Range Status   Specimen Description TRACHEAL ASPIRATE  Final   Special Requests NONE  Final   Gram Stain   Final    RARE WBC PRESENT, PREDOMINANTLY PMN MODERATE GRAM POSITIVE COCCI MODERATE GRAM NEGATIVE RODS FEW GRAM POSITIVE RODS Performed at [facility], [address] [place], [name] [address]    Culture ABUNDANT PSEUDOMONAS AERUGINOSA  Final   Report Status 05/12/2024 FINAL  Final   Organism ID, Bacteria PSEUDOMONAS AERUGINOSA  Final      Susceptibility   Pseudomonas aeruginosa - MIC*    MEROPENEM 1 SENSITIVE Sensitive     CIPROFLOXACIN  0.25 SENSITIVE Sensitive     IMIPENEM 2 SENSITIVE Sensitive     PIP/TAZO Value in next row Sensitive      <=4 SENSITIVEThis is a modified FDA-approved test that has been validated and its performance characteristics determined by the reporting laboratory.  This laboratory is certified under the Clinical Laboratory Improvement Amendments CLIA as qualified to perform high complexity clinical laboratory testing.    CEFEPIME  Value in next row Sensitive      <=4 SENSITIVEThis is a modified FDA-approved test that has been validated and its performance characteristics determined by the reporting laboratory.  This laboratory is certified under the Clinical Laboratory Improvement Amendments CLIA as qualified to perform high complexity clinical laboratory testing.    CEFTAZIDIME/AVIBACTAM Value in next row Sensitive      <=4 SENSITIVEThis is a modified FDA-approved test that has been validated and its performance characteristics determined by the reporting laboratory.  This laboratory is certified under the Clinical Laboratory Improvement Amendments CLIA as qualified to perform high complexity clinical laboratory testing.    CEFTOLOZANE/TAZOBACTAM Value in next row  Sensitive      <=4 SENSITIVEThis is a modified FDA-approved test that has been validated and its performance characteristics determined by the reporting laboratory.  This laboratory is certified under the Clinical Laboratory Improvement Amendments CLIA as qualified to perform high complexity clinical laboratory testing.    TOBRAMYCIN Value in next row Sensitive      <=4 SENSITIVEThis is a modified FDA-approved test that has been validated and its performance characteristics determined by the reporting laboratory.  This laboratory is certified under the Clinical Laboratory Improvement Amendments CLIA as qualified to perform high complexity clinical  laboratory testing.    CEFTAZIDIME Value in next row Sensitive      <=4 SENSITIVEThis is a modified FDA-approved test that has been validated and its performance characteristics determined by the reporting laboratory.  This laboratory is certified under the Clinical Laboratory Improvement Amendments CLIA as qualified to perform high complexity clinical laboratory testing.    * ABUNDANT PSEUDOMONAS AERUGINOSA  Culture, blood (Routine X 2) w Reflex to ID Panel     Status: None   Collection Time: 05/10/24  4:47 PM   Specimen: BLOOD RIGHT HAND  Result Value Ref Range Status   Specimen Description BLOOD RIGHT HAND  Final   Special Requests   Final    BOTTLES DRAWN AEROBIC AND ANAEROBIC Blood Culture adequate volume   Culture   Final    NO GROWTH 5 DAYS Performed at [facility], [address] [place], [name] [address]    Report Status 05/15/2024 FINAL  Final  Culture, blood (Routine X 2) w Reflex to ID Panel     Status: None   Collection Time: 05/10/24  4:52 PM   Specimen: BLOOD LEFT ARM  Result Value Ref Range Status   Specimen Description BLOOD LEFT ARM  Final   Special Requests   Final    BOTTLES DRAWN AEROBIC AND ANAEROBIC Blood Culture results may not be optimal due to an inadequate volume of blood received in culture bottles   Culture    Final    NO GROWTH 5 DAYS Performed at [facility], [address] [place], [name] [address]    Report Status 05/15/2024 FINAL  Final    Radiology Studies: DG ABDOMEN PEG TUBE LOCATION Result Date: 05/15/2024 EXAM: 1 VIEW XRAY OF THE ABDOMEN 05/15/2024 12:21:12 PM COMPARISON: 12/30/2023 CLINICAL HISTORY: Feeding by G-tube FINDINGS: LINES, TUBES AND DEVICES: Percutaneous gastrostomy tube in place with tip overlying the gastric body. BOWEL: Contrast administration through the gastrostomy tube opacifies the gastric lumen. Nonobstructive bowel gas pattern. SOFT TISSUES: No opaque urinary calculi. BONES: No acute osseous abnormality. IMPRESSION: 1. Percutaneous gastrostomy tube appropriately positioned with the tip overlying the gastric body; contrast opacifies the gastric lumen, confirming intragastric placement. Electronically signed by: Waddell Calk MD 05/15/2024 12:43 PM EDT RP Workstation: [id number]    Alm Apo, MD [facility] 05/15/2024, 1:47 PM  If 7PM-7AM, please contact night-coverage [email or web address] 05/15/2024, 1:47 PM

## 2024-05-16 DIAGNOSIS — J9601 Acute respiratory failure with hypoxia: Secondary | ICD-10-CM | POA: Diagnosis not present

## 2024-05-16 DIAGNOSIS — J9611 Chronic respiratory failure with hypoxia: Secondary | ICD-10-CM | POA: Diagnosis not present

## 2024-05-16 DIAGNOSIS — Z789 Other specified health status: Secondary | ICD-10-CM | POA: Diagnosis not present

## 2024-05-16 DIAGNOSIS — Z7189 Other specified counseling: Secondary | ICD-10-CM | POA: Diagnosis not present

## 2024-05-16 DIAGNOSIS — I619 Nontraumatic intracerebral hemorrhage, unspecified: Secondary | ICD-10-CM | POA: Diagnosis not present

## 2024-05-16 LAB — GLUCOSE, CAPILLARY
Glucose-Capillary: 109 mg/dL — ABNORMAL HIGH (ref 70–99)
Glucose-Capillary: 160 mg/dL — ABNORMAL HIGH (ref 70–99)
Glucose-Capillary: 83 mg/dL (ref 70–99)
Glucose-Capillary: 97 mg/dL (ref 70–99)
Glucose-Capillary: 119 mg/dL — ABNORMAL HIGH (ref 70–99)
Glucose-Capillary: 158 mg/dL — ABNORMAL HIGH (ref 70–99)
Glucose-Capillary: 128 mg/dL — ABNORMAL HIGH (ref 70–99)
Glucose-Capillary: 91 mg/dL (ref 70–99)
Glucose-Capillary: 104 mg/dL — ABNORMAL HIGH (ref 70–99)
Glucose-Capillary: 94 mg/dL (ref 70–99)
Glucose-Capillary: 159 mg/dL — ABNORMAL HIGH (ref 70–99)
Glucose-Capillary: 92 mg/dL (ref 70–99)
Glucose-Capillary: 115 mg/dL — ABNORMAL HIGH (ref 70–99)
Glucose-Capillary: 129 mg/dL — ABNORMAL HIGH (ref 70–99)

## 2024-05-16 NOTE — Progress Notes (Signed)
 PROGRESS NOTE  Ritter Helsley FMW:[id number] DOB: 03-Dec-1975   PCP: Pcp, No  Patient is from: Home  DOA: 10/27/2023 LOS: 202  Chief complaints Chief Complaint  Patient presents with   Unresponsive     Brief Narrative / Interim history: 48 y/o male with past medical history of hypertension was found down at home agonal breathing. History of uncontrolled hypertension but never had been to the doctor. Patient was noted to have a large (4.1 cm) hemorrhage centered in the pons with intraventricular extension into the fourth ventricle and also extension into the basal cisterns and trace additional subarachnoid hemorrhage along the left parietal convexity. BP in ED was 262/156. CXR revealed RUL and perihilar infiltrates suggesting aspiration pneumonia. ED labs also revealed PH 7.169, LA 4.4, CPK 985. Patient was intubated in ED and was admitted hospital.    Patient had prolonged complicated hospital stay pontine hemorrhage, biparietal SAH.  Patient's mental status poor, s/p tracheostomy and PEG placement.  Sputum cultures positive for MRSA on contact precautions.  Patient had prolonged difficulty weaning from vent.  Patient is on trach collar for few days but later required ventilatory support mostly at nights and is currently in ICU.  Subjective: No events overnight. Patient known to me from previous.  Last seen in June 2025.  Overall he seems to be about the same or worse considering he is vent dependent at times now. PEG dislodged again. Consulting IR to help replace.   Objective: Vitals:   05/16/24 1300 05/16/24 1400 05/16/24 1500 05/16/24 1600  BP: (!) 139/98 (!) 132/96 (!) 135/99 (!) 133/99  Pulse: 82 80 82 83  Resp: (!) 23 (!) 22 (!) 31 (!) 24  Temp:      TempSrc:      SpO2: 91% 96% 96% 96%  Weight:      Height:        Examination: Constitutional:      Comments: Laying in bed appearing uncomfortable but no distress.  Occasionally opening eyes with no consistent meaningful  purpose  HENT:     Head:     Mouth/Throat:     Mouth: Mucous membranes are dry.  Eyes:     Comments: Intermittent right eye movement at times with no consistent tracking or blink reflex. Left eye opaque Cardiovascular:     Rate and Rhythm: Normal rate and regular rhythm.  Pulmonary:     Comments: regular respi pattern Abdominal:     General: There is no distension.     Palpations: Abdomen is soft.  Musculoskeletal:        General: Normal range of motion. Skin:    General: Skin is warm and dry.  Neurological:     Comments: Does not interact or follow commands.  Occasionally opens eyes with no meaningful purpose that can be repeated consistently. 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 -Continue supportive care-trach and vent and tube feed.  -On and off tachycardia, tachypnea noted. -Continue amlodipine , Lopressor  and as needed hydralazine . -Mental status continue to be poor.   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  Not a candidate for decannulation given ineffective airway clearance, -Continue trach collar during day with vent support at night. On and off tachypnea persists. -Continue trach care per protocol. -Pulmonary follow up appreciated.    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  Bowen evaluated and placed tarsorraphy.   -8/29-ophtho rec: Erythromycin  eyedrop q2h, tape eyes with paper tape if not blinking. -9/14, ophtho, Dr. Leane reengaged due to worsening conjunctivitis and clouding I recommended second antibiotic, gatifloxacin  4 times daily.per Optho, no epithelial defects noted. - 9/25: courses completed of abx drops; continue on opth ointment and cover left eye with patch to keep lid closed    SIRS: Blood cultures NGTD.  CXR unrevealing.  No  leukocytosis.  Resp culture with Pseudomonas which could be just colonization since fever resolved without antibiotics.  No report of increased secretion. -Monitor off antibiotics  Hypernatremia: Resolved. - Continue free water  at 150 cc every 6 hours. - Intermittently monitor labs  Elevated liver enzymes: Mild.  Unclear etiology.  CK normal. - Check intermittently.  Inadequate oral intake - PEG replaced by IR on 05/15/24 after becoming dislodged again Body mass index is 30.84 kg/m. Nutrition Problem: Inadequate oral intake Etiology: inability to eat Signs/Symptoms: NPO status Interventions: Tube feeding, Prostat, MVI, Juven   DVT prophylaxis:  enoxaparin  (LOVENOX ) injection 40 mg Start: 03/06/24 1000 SCDs Start: 10/27/23 2226  Code Status: Full code Family Communication: None at bedside Level of care: ICU Status is: Inpatient Remains inpatient appropriate because: Intracranial hemorrhage, vegetative state   Final disposition: Unclear   55 minutes with more than 50% spent in reviewing records, counseling patient/family and coordinating care.   Sch Meds:  Scheduled Meds:  amLODipine   10 mg Per Tube Daily   artificial tears   Both Eyes BID   Chlorhexidine  Gluconate Cloth  6 each Topical Daily   enoxaparin  (LOVENOX ) injection  40 mg Subcutaneous Daily   famotidine   20 mg Per Tube BID   feeding supplement (JEVITY 1.5 CAL/FIBER)  237 mL Per Tube 5 X Daily   feeding supplement (PROSource TF20)  60 mL Per Tube TID   free water   150 mL Per Tube Q6H   insulin  aspart  0-9 Units Subcutaneous Q6H   metoprolol  tartrate  75 mg Per Tube BID   multivitamin with minerals  1 tablet Per Tube Daily   mouth rinse  15 mL Mouth Rinse Q2H   polyethylene glycol  17 g Per Tube Daily   Continuous Infusions: PRN Meds:.acetaminophen  **OR** acetaminophen  (TYLENOL ) oral liquid 160 mg/5 mL **OR** acetaminophen , bisacodyl , Gerhardt's butt cream, hydrALAZINE , ipratropium-albuterol , mouth rinse,  traZODone   Antimicrobials: Anti-infectives (From admission, onward)    Start     Dose/Rate Route Frequency Ordered Stop   04/09/24 2200  linezolid  (ZYVOX ) tablet 600 mg  Status:  Discontinued        600 mg Per Tube Every 12 hours 04/09/24 1132 04/13/24 1332   04/09/24 1700  vancomycin  (VANCOREADY) IVPB 1500 mg/300 mL  Status:  Discontinued        1,500 mg 150 mL/hr over 120 Minutes Intravenous Every 12 hours 04/09/24 1108 04/09/24 1132   04/09/24 1000  linezolid  (ZYVOX ) IVPB 600 mg  Status:  Discontinued        600 mg 300 mL/hr over 60 Minutes Intravenous Every 12 hours 04/09/24 0527 04/09/24 1108   04/01/24 1145  ceFEPIme  (MAXIPIME ) 2 g in sodium chloride  0.9 % 100 mL IVPB        2 g 200 mL/hr over 30 Minutes Intravenous Every 8 hours 04/01/24 1137 04/14/24 2230   02/21/24 1000  vancomycin  (VANCOREADY) IVPB 1250 mg/250 mL        1,250 mg 166.7 mL/hr over 90 Minutes Intravenous Every 12 hours 02/20/24 2158 02/28/24 0130  02/16/24 1445  ceFEPIme  (MAXIPIME ) 2 g in sodium chloride  0.9 % 100 mL IVPB        2 g 200 mL/hr over 30 Minutes Intravenous Every 8 hours 02/16/24 1348 02/27/24 2322   02/16/24 0945  vancomycin  (VANCOREADY) IVPB 1500 mg/300 mL  Status:  Discontinued        1,500 mg 150 mL/hr over 120 Minutes Intravenous Every 12 hours 02/16/24 0848 02/20/24 2158   02/15/24 1215  Ampicillin -Sulbactam (UNASYN ) 3 g in sodium chloride  0.9 % 100 mL IVPB  Status:  Discontinued        3 g 200 mL/hr over 30 Minutes Intravenous Every 6 hours 02/15/24 1125 02/16/24 1226   01/24/24 2000  vancomycin  (VANCOREADY) IVPB 1250 mg/250 mL        1,250 mg 166.7 mL/hr over 90 Minutes Intravenous Every 24 hours 01/24/24 1154 01/27/24 0125   01/22/24 2200  Vancomycin  (VANCOCIN ) 1,250 mg in sodium chloride  0.9 % 250 mL IVPB  Status:  Discontinued        1,250 mg 166.7 mL/hr over 90 Minutes Intravenous Every 24 hours 01/22/24 1013 01/22/24 1020   01/22/24 2000  vancomycin  (VANCOREADY) IVPB 1250 mg/250 mL   Status:  Discontinued        1,250 mg 166.7 mL/hr over 90 Minutes Intravenous Every 24 hours 01/22/24 1020 01/24/24 1154   01/20/24 2000  ceFEPIme  (MAXIPIME ) 2 g in sodium chloride  0.9 % 100 mL IVPB  Status:  Discontinued        2 g 200 mL/hr over 30 Minutes Intravenous Every 8 hours 01/20/24 1900 01/24/24 1154   01/20/24 2000  Vancomycin  (VANCOCIN ) 1,250 mg in sodium chloride  0.9 % 250 mL IVPB  Status:  Discontinued        1,250 mg 166.7 mL/hr over 90 Minutes Intravenous Every 24 hours 01/20/24 1900 01/22/24 1013   01/03/24 1730  cefTRIAXone  (ROCEPHIN ) 1 g in sodium chloride  0.9 % 100 mL IVPB        1 g 200 mL/hr over 30 Minutes Intravenous Every 24 hours 01/03/24 1235 01/07/24 0710   01/02/24 1830  ceFEPIme  (MAXIPIME ) 2 g in sodium chloride  0.9 % 100 mL IVPB  Status:  Discontinued        2 g 200 mL/hr over 30 Minutes Intravenous Every 8 hours 01/02/24 1732 01/03/24 1235   01/02/24 1830  Vancomycin  (VANCOCIN ) 1,250 mg in sodium chloride  0.9 % 250 mL IVPB  Status:  Discontinued        1,250 mg 166.7 mL/hr over 90 Minutes Intravenous Every 24 hours 01/02/24 1732 01/04/24 1754   01/01/24 1200  cefTRIAXone  (ROCEPHIN ) 1 g in sodium chloride  0.9 % 100 mL IVPB  Status:  Discontinued        1 g 200 mL/hr over 30 Minutes Intravenous Every 24 hours 01/01/24 1057 01/02/24 1713   11/29/23 2200  linezolid  (ZYVOX ) tablet 600 mg        600 mg Per Tube Every 12 hours 11/29/23 1034 12/04/23 2215   11/28/23 1000  linezolid  (ZYVOX ) IVPB 600 mg  Status:  Discontinued        600 mg 300 mL/hr over 60 Minutes Intravenous Every 12 hours 11/28/23 0831 11/29/23 1034   11/23/23 1200  vancomycin  (VANCOREADY) IVPB 1250 mg/250 mL  Status:  Discontinued        1,250 mg 166.7 mL/hr over 90 Minutes Intravenous Every 24 hours 11/23/23 1036 11/28/23 0831   11/20/23 1100  vancomycin  (VANCOREADY) IVPB 1250 mg/250 mL  Status:  Discontinued        1,250 mg 166.7 mL/hr over 90 Minutes Intravenous Every 24 hours 11/20/23  0923 11/20/23 0923   11/20/23 1100  vancomycin  (VANCOREADY) IVPB 1250 mg/250 mL  Status:  Discontinued        1,250 mg 166.7 mL/hr over 90 Minutes Intravenous Every 24 hours 11/20/23 0923 11/23/23 1036   11/15/23 2300  vancomycin  (VANCOREADY) IVPB 1250 mg/250 mL        1,250 mg 166.7 mL/hr over 90 Minutes Intravenous Every 24 hours 11/15/23 2215 11/19/23 0924   11/12/23 2200  vancomycin  (VANCOREADY) IVPB 750 mg/150 mL  Status:  Discontinued        750 mg 150 mL/hr over 60 Minutes Intravenous Every 12 hours 11/12/23 0855 11/15/23 2215   11/12/23 0930  vancomycin  (VANCOREADY) IVPB 2000 mg/400 mL        2,000 mg 200 mL/hr over 120 Minutes Intravenous  Once 11/12/23 0838 11/12/23 1116   11/11/23 1345  Ampicillin -Sulbactam (UNASYN ) 3 g in sodium chloride  0.9 % 100 mL IVPB  Status:  Discontinued        3 g 200 mL/hr over 30 Minutes Intravenous Every 6 hours 11/11/23 1257 11/12/23 0836   10/28/23 0400  Ampicillin -Sulbactam (UNASYN ) 3 g in sodium chloride  0.9 % 100 mL IVPB  Status:  Discontinued        3 g 200 mL/hr over 30 Minutes Intravenous Every 6 hours 10/27/23 2151 11/02/23 0945   10/27/23 1845  vancomycin  (VANCOCIN ) IVPB 1000 mg/200 mL premix  Status:  Discontinued        1,000 mg 200 mL/hr over 60 Minutes Intravenous  Once 10/27/23 1843 10/27/23 1843   10/27/23 1845  ceFEPIme  (MAXIPIME ) 2 g in sodium chloride  0.9 % 100 mL IVPB        2 g 200 mL/hr over 30 Minutes Intravenous  Once 10/27/23 1843 10/27/23 2024   10/27/23 1845  vancomycin  (VANCOREADY) IVPB 2000 mg/400 mL        2,000 mg 200 mL/hr over 120 Minutes Intravenous  Once 10/27/23 1843 10/27/23 2123        I have personally reviewed the following labs and images: CBC: Recent Labs  Lab 05/10/24 1652 05/11/24 0205 05/12/24 1258  WBC 10.5 9.9 10.9*  NEUTROABS 7.7  --   --   HGB 11.1* 10.0* 10.7*  HCT 38.1* 34.3* 36.8*  MCV 92.0 93.5 92.5  PLT 404* 367 364   BMP &GFR Recent Labs  Lab 05/10/24 1652 05/11/24 0205  05/12/24 1258  NA 148* 148* 143  K 4.1 4.2 3.6  CL 108 109 100  CO2 27 29 32  GLUCOSE 129* 115* 124*  BUN 27* 30* 23*  CREATININE 0.46* 0.49* 0.44*  CALCIUM 9.3 9.2 9.2  MG 2.3 2.3  --   PHOS  --  4.0  --    Estimated Creatinine Clearance: 125.6 mL/min (A) (by C-G formula based on SCr of 0.44 mg/dL (L)). Liver & Pancreas: Recent Labs  Lab 05/10/24 1652 05/11/24 0205 05/12/24 1258  AST 56* 53* 50*  ALT 107* 109* 115*  ALKPHOS 68 61 65  BILITOT 0.4 0.4 0.2  PROT 8.5* 7.5 7.9  ALBUMIN  3.3* 2.9* 3.0*   No results for input(s): LIPASE, AMYLASE in the last 168 hours. No results for input(s): AMMONIA in the last 168 hours. Diabetic: No results for input(s): HGBA1C in the last 72 hours. Recent Labs  Lab 05/15/24 2010 05/16/24 0315 05/16/24 0513 05/16/24 0603 05/16/24 1102  GLUCAP 128*  91 104* 94 159*   Cardiac Enzymes: Recent Labs  Lab 05/12/24 1258  CKTOTAL 45*   No results for input(s): PROBNP in the last 8760 hours. Coagulation Profile: No results for input(s): INR, PROTIME in the last 168 hours. Thyroid Function Tests: No results for input(s): TSH, T4TOTAL, FREET4, T3FREE, THYROIDAB in the last 72 hours. Lipid Profile: No results for input(s): CHOL, HDL, LDLCALC, TRIG, CHOLHDL, LDLDIRECT in the last 72 hours. Anemia Panel: No results for input(s): VITAMINB12, FOLATE, FERRITIN, TIBC, IRON, RETICCTPCT in the last 72 hours. Urine analysis:    Component Value Date/Time   COLORURINE YELLOW 11/07/2023 0930   APPEARANCEUR CLEAR 11/07/2023 0930   LABSPEC 1.017 11/07/2023 0930   PHURINE 5.0 11/07/2023 0930   GLUCOSEU NEGATIVE 11/07/2023 0930   HGBUR NEGATIVE 11/07/2023 0930   BILIRUBINUR NEGATIVE 11/07/2023 0930   KETONESUR NEGATIVE 11/07/2023 0930   PROTEINUR NEGATIVE 11/07/2023 0930   NITRITE NEGATIVE 11/07/2023 0930   LEUKOCYTESUR NEGATIVE 11/07/2023 0930   Sepsis Labs: Invalid input(s): PROCALCITONIN,  LACTICIDVEN  Microbiology: Recent Results (from the past 240 hours)  Culture, Respiratory w Gram Stain     Status: None   Collection Time: 05/10/24  2:56 PM   Specimen: Tracheal Aspirate; Respiratory  Result Value Ref Range Status   Specimen Description TRACHEAL ASPIRATE  Final   Special Requests NONE  Final   Gram Stain   Final    RARE WBC PRESENT, PREDOMINANTLY PMN MODERATE GRAM POSITIVE COCCI MODERATE GRAM NEGATIVE RODS FEW GRAM POSITIVE RODS Performed at [facility], [address] [place], [name] [address]    Culture ABUNDANT PSEUDOMONAS AERUGINOSA  Final   Report Status 05/12/2024 FINAL  Final   Organism ID, Bacteria PSEUDOMONAS AERUGINOSA  Final      Susceptibility   Pseudomonas aeruginosa - MIC*    MEROPENEM 1 SENSITIVE Sensitive     CIPROFLOXACIN  0.25 SENSITIVE Sensitive     IMIPENEM 2 SENSITIVE Sensitive     PIP/TAZO Value in next row Sensitive      <=4 SENSITIVEThis is a modified FDA-approved test that has been validated and its performance characteristics determined by the reporting laboratory.  This laboratory is certified under the Clinical Laboratory Improvement Amendments CLIA as qualified to perform high complexity clinical laboratory testing.    CEFEPIME  Value in next row Sensitive      <=4 SENSITIVEThis is a modified FDA-approved test that has been validated and its performance characteristics determined by the reporting laboratory.  This laboratory is certified under the Clinical Laboratory Improvement Amendments CLIA as qualified to perform high complexity clinical laboratory testing.    CEFTAZIDIME/AVIBACTAM Value in next row Sensitive      <=4 SENSITIVEThis is a modified FDA-approved test that has been validated and its performance characteristics determined by the reporting laboratory.  This laboratory is certified under the Clinical Laboratory Improvement Amendments CLIA as qualified to perform high complexity clinical laboratory testing.     CEFTOLOZANE/TAZOBACTAM Value in next row Sensitive      <=4 SENSITIVEThis is a modified FDA-approved test that has been validated and its performance characteristics determined by the reporting laboratory.  This laboratory is certified under the Clinical Laboratory Improvement Amendments CLIA as qualified to perform high complexity clinical laboratory testing.    TOBRAMYCIN Value in next row Sensitive      <=4 SENSITIVEThis is a modified FDA-approved test that has been validated and its performance characteristics determined by the reporting laboratory.  This laboratory is certified under the Clinical Laboratory Improvement Amendments  CLIA as qualified to perform high complexity clinical laboratory testing.    CEFTAZIDIME Value in next row Sensitive      <=4 SENSITIVEThis is a modified FDA-approved test that has been validated and its performance characteristics determined by the reporting laboratory.  This laboratory is certified under the Clinical Laboratory Improvement Amendments CLIA as qualified to perform high complexity clinical laboratory testing.    * ABUNDANT PSEUDOMONAS AERUGINOSA  Culture, blood (Routine X 2) w Reflex to ID Panel     Status: None   Collection Time: 05/10/24  4:47 PM   Specimen: BLOOD RIGHT HAND  Result Value Ref Range Status   Specimen Description BLOOD RIGHT HAND  Final   Special Requests   Final    BOTTLES DRAWN AEROBIC AND ANAEROBIC Blood Culture adequate volume   Culture   Final    NO GROWTH 5 DAYS Performed at [facility], [address], [place], [name] [address]    Report Status 05/15/2024 FINAL  Final  Culture, blood (Routine X 2) w Reflex to ID Panel     Status: None   Collection Time: 05/10/24  4:52 PM   Specimen: BLOOD LEFT ARM  Result Value Ref Range Status   Specimen Description BLOOD LEFT ARM  Final   Special Requests   Final    BOTTLES DRAWN AEROBIC AND ANAEROBIC Blood Culture results may not be optimal due to an inadequate volume of blood  received in culture bottles   Culture   Final    NO GROWTH 5 DAYS Performed at [facility], [address] [place], [name] [address]    Report Status 05/15/2024 FINAL  Final    Radiology Studies: No results found.   Alm Apo, MD [facility] 05/16/2024, 4:37 PM  If 7PM-7AM, please contact night-coverage [email or web address] 05/16/2024, 4:37 PM

## 2024-05-16 NOTE — Progress Notes (Signed)
 Peg tube found to be out of patient when repositioning. Balloon at distal end appears intact, but not inflated. Dr Patsy notified and Warren Dais NP states she will be up to assess site to replace peg tube.

## 2024-05-16 NOTE — Progress Notes (Signed)
 Nutrition Follow-up  DOCUMENTATION CODES:  Not applicable  INTERVENTION:  Continue bolus tube feeding via PEG: 5 cartons ( ) Jevity 1.5 daily 60ml water  flush before and after each bolus ( total) 60ml ProSource TF20 TID Provides 2015 kcal, 136g protein, total free water  (TF+FWF)   Given nutrition stability, RD will check in monthly to ensure tube feeding regimen remains appropriate and there are no new tolerance issues. Please place RD consult if any nutrition related issues should arise.   NUTRITION DIAGNOSIS:  Inadequate oral intake related to inability to eat as evidenced by NPO status. - NPO status ongoing; receiving enteral nutrition  GOAL:  Patient will meet greater than or equal to 90% of their needs - met via enteral nutrition  MONITOR:  Labs, Weight trends, TF tolerance, Skin  REASON FOR ASSESSMENT:  Consult Enteral/tube feeding initiation and management  ASSESSMENT:  Pt with PMH of HTN, daily mariajuana, and smoker admitted after being found down at home with pontine hemorrhagic stroke and trace SAH. UDS positive for opioids. Noted aspiration PNA on admission.  3/08 - admitted with [facility], intubated 3/14 - s/p cortrak placement (tip gastric) 3/22 - s/p tracheostomy 3/24 - s/p PEG 4/20 - Cuff changed to cuffless 5/10 - PEG dislodged, TF's stopped  5/11 - PEG replaced, TF's restarted at goal  6/10 - trach removed 7/07- desat, transferred back to 4N ICU, re-intubated  7/16 - s/p trach placement   8/12 - off vent for >48 hours  8/20 - back to ICU on vent d/t tachypnea + hypoxia 9/18 - trach exchange 9/22 - tolerating ATC during the day and nocturnal vent 9/26 - PEG dislodged, replaced  No new nutrition related concerns. PEG tube dislodged yesterday, replaced at bedside without difficulty.   He continues to remain stable on tube feeding regimen of 5 cartons of Jevity 1.5 daily with ProSource TF20 TID.   No new skin issues documented or reported by  RN.   Repeat nutrition focused physical exam completed. No changes in exam.  Bed scale appears re-calibrated on 9/9. Current weights appear stable and more in line with weights earlier on in admission.    Will continue with current nutrition interventions given nutrition stability.   Medications: SSI 0-9 units q6h, MVI, miralax  daily  Labs last updated 9/22 CBG's 91-159 x24 hours  NUTRITION - FOCUSED PHYSICAL EXAM: Flowsheet Row Most Recent Value  Orbital Region No depletion  Upper Arm Region No depletion  Thoracic and Lumbar Region No depletion  Buccal Region No depletion  Temple Region Mild depletion  Clavicle Bone Region No depletion  Clavicle and Acromion Bone Region No depletion  Scapular Bone Region No depletion  Dorsal Hand Unable to assess  [mild non-pitting edema]  Patellar Region Mild depletion  Anterior Thigh Region Moderate depletion  Posterior Calf Region No depletion  Edema (RD Assessment) Mild  [non-pitting generalized edema]  Hair Reviewed  Eyes Other (Comment)  [eye patch covering left eye]  Mouth Unable to assess  Skin Reviewed  Nails Reviewed    Diet Order:   Diet Order             Diet NPO time specified  Diet effective now                   EDUCATION NEEDS:  No education needs have been identified at this time  Skin:  Skin Assessment: Reviewed RN Assessment  Last BM:  9/26  Height:  Ht Readings from Last 1 Encounters:  05/09/24 5'  8 (1.727 m)   Weight:  Wt Readings from Last 1 Encounters:  05/16/24 92 kg   Ideal Body Weight:  70 kg  BMI:  Body mass index is 30.84 kg/m.  Estimated Nutritional Needs:   Kcal:  2000-2200  Protein:  125-140 grams  Fluid:  >2 L/day  Royce Maris, RDN, LDN Clinical Nutrition See AMiON for contact information.

## 2024-05-16 NOTE — Plan of Care (Signed)
 UOP: , 1BM, tolerating TF. Fiance visited. Eye taped. Q2 turns.   Problem: Nutrition: Goal: Risk of aspiration will decrease Outcome: Progressing Goal: Dietary intake will improve Outcome: Progressing   Problem: Elimination: Goal: Will not experience complications related to bowel motility Outcome: Progressing   Problem: Intracerebral Hemorrhage Tissue Perfusion: Goal: Complications of Intracerebral Hemorrhage will be minimized Outcome: Not Progressing

## 2024-05-16 NOTE — Plan of Care (Signed)
 Pt unable to verbalize needs, but appears calm, with no signs of acute distress. Pt with tracheostomy with vent support at night. Bolus TF and free water  given. Foley in place draining yello Problem: Tissue Perfusion: Goal: Adequacy of tissue perfusion will improve Outcome: Progressing   Problem: Clinical Measurements: Goal: Will remain free from infection Outcome: Progressing   Problem: Intracerebral Hemorrhage Tissue Perfusion: Goal: Complications of Intracerebral Hemorrhage will be minimized Outcome: Not Progressing

## 2024-05-17 DIAGNOSIS — Z7189 Other specified counseling: Secondary | ICD-10-CM | POA: Diagnosis not present

## 2024-05-17 DIAGNOSIS — J9611 Chronic respiratory failure with hypoxia: Secondary | ICD-10-CM | POA: Diagnosis not present

## 2024-05-17 DIAGNOSIS — J9601 Acute respiratory failure with hypoxia: Secondary | ICD-10-CM | POA: Diagnosis not present

## 2024-05-17 DIAGNOSIS — I619 Nontraumatic intracerebral hemorrhage, unspecified: Secondary | ICD-10-CM | POA: Diagnosis not present

## 2024-05-17 DIAGNOSIS — Z789 Other specified health status: Secondary | ICD-10-CM | POA: Diagnosis not present

## 2024-05-17 LAB — CBC WITH DIFFERENTIAL/PLATELET
Abs Immature Granulocytes: 0.03 K/uL (ref 0.00–0.07)
Basophils Absolute: 0 K/uL (ref 0.0–0.1)
Basophils Relative: 0 %
Eosinophils Absolute: 0.2 K/uL (ref 0.0–0.5)
Eosinophils Relative: 1 %
HCT: 34.9 % — ABNORMAL LOW (ref 39.0–52.0)
Hemoglobin: 10.3 g/dL — ABNORMAL LOW (ref 13.0–17.0)
Immature Granulocytes: 0 %
Lymphocytes Relative: 11 %
Lymphs Abs: 1.3 K/uL (ref 0.7–4.0)
MCH: 27 pg (ref 26.0–34.0)
MCHC: 29.5 g/dL — ABNORMAL LOW (ref 30.0–36.0)
MCV: 91.6 fL (ref 80.0–100.0)
Monocytes Absolute: 0.8 K/uL (ref 0.1–1.0)
Monocytes Relative: 7 %
Neutro Abs: 9.4 K/uL — ABNORMAL HIGH (ref 1.7–7.7)
Neutrophils Relative %: 81 %
Platelets: 368 K/uL (ref 150–400)
RBC: 3.81 MIL/uL — ABNORMAL LOW (ref 4.22–5.81)
RDW: 16.2 % — ABNORMAL HIGH (ref 11.5–15.5)
WBC: 11.6 K/uL — ABNORMAL HIGH (ref 4.0–10.5)
nRBC: 0 % (ref 0.0–0.2)

## 2024-05-17 LAB — GLUCOSE, CAPILLARY
Glucose-Capillary: 130 mg/dL — ABNORMAL HIGH (ref 70–99)
Glucose-Capillary: 105 mg/dL — ABNORMAL HIGH (ref 70–99)
Glucose-Capillary: 120 mg/dL — ABNORMAL HIGH (ref 70–99)
Glucose-Capillary: 136 mg/dL — ABNORMAL HIGH (ref 70–99)

## 2024-05-17 NOTE — Progress Notes (Signed)
 PROGRESS NOTE  Jobie Popp FMW:[id number] DOB: Nov 07, 1975   PCP: Pcp, No  Patient is from: Home  DOA: 10/27/2023 LOS: 203  Chief complaints Chief Complaint  Patient presents with   Unresponsive     Brief Narrative / Interim history: 48 y/o male with past medical history of hypertension was found down at home agonal breathing. History of uncontrolled hypertension but never had been to the doctor. Patient was noted to have a large (4.1 cm) hemorrhage centered in the pons with intraventricular extension into the fourth ventricle and also extension into the basal cisterns and trace additional subarachnoid hemorrhage along the left parietal convexity. BP in ED was 262/156. CXR revealed RUL and perihilar infiltrates suggesting aspiration pneumonia. ED labs also revealed PH 7.169, LA 4.4, CPK 985. Patient was intubated in ED and was admitted hospital.    Patient had prolonged complicated hospital stay pontine hemorrhage, biparietal SAH.  Patient's mental status poor, s/p tracheostomy and PEG placement.  Sputum cultures positive for MRSA on contact precautions.  Patient had prolonged difficulty weaning from vent.  Patient is on trach collar for few days but later required ventilatory support mostly at nights and is currently in ICU.  Subjective: No events overnight. Awake in bed, right eye open, no tracking.   Objective: Vitals:   05/17/24 1000 05/17/24 1100 05/17/24 1139 05/17/24 1200  BP: 133/86 123/89  (!) 126/92  Pulse: 77 75  72  Resp: 19 (!) 24  (!) 25  Temp:   98.4 F (36.9 C)   TempSrc:   Axillary   SpO2: 98% 98%  97%  Weight:      Height:        Examination: Constitutional:      Comments: Laying in bed in no distress.  Right eye open and wandering but no tracking or blink reflex HENT:     Head:     Mouth/Throat:     Mouth: Mucous membranes are dry.  Eyes:     Comments: Intermittent right eye movement at times with no consistent tracking or blink reflex. Left eye  opaque Cardiovascular:     Rate and Rhythm: Normal rate and regular rhythm.  Pulmonary:     Comments: regular respi pattern Abdominal:     General: There is no distension.     Palpations: Abdomen is soft.  Musculoskeletal:        General: Normal range of motion. Skin:    General: Skin is warm and dry.  Neurological:     Comments: Does not interact or follow commands.  Occasionally opens R eye with no meaningful purpose that can be repeated consistently. 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  Not a candidate for decannulation given ineffective airway clearance, -Continue trach collar during day with vent support at night. On and off tachypnea persists. -Continue trach care per protocol. -Pulmonary follow up appreciated.    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 Waylan evaluated and placed tarsorraphy.   -8/29-ophtho rec: Erythromycin  eyedrop q2h, tape eyes with paper tape if not blinking. -9/14, ophtho, Dr. Leane reengaged due to  worsening conjunctivitis and clouding I recommended second antibiotic, gatifloxacin  4 times daily.per Optho, no epithelial defects noted. - 9/25: courses completed of abx drops; continue on opth ointment and cover left eye with patch to keep lid closed    SIRS: Blood cultures NGTD.  CXR unrevealing.  No leukocytosis.  Resp culture with Pseudomonas which could be just colonization since fever resolved without antibiotics.  No report of increased secretion. -Monitor off  antibiotics  Hypernatremia: Resolved. - Continue free water  at 150 cc every 6 hours. - Intermittently monitor labs  Elevated liver enzymes: Mild.  Unclear etiology.  CK normal. - Check intermittently.  Inadequate oral intake - PEG replaced by IR on 05/15/24 after becoming dislodged again Body mass index is 32.18 kg/m. Nutrition Problem: Inadequate oral intake Etiology: inability to eat Signs/Symptoms: NPO status Interventions: Tube feeding, Prostat, MVI, Juven   DVT prophylaxis:  enoxaparin  (LOVENOX ) injection 40 mg Start: 03/06/24 1000 SCDs Start: 10/27/23 2226  Code Status: Full code Family Communication: None at bedside Level of care: ICU Status is: Inpatient Remains inpatient appropriate because: Intracranial hemorrhage, vegetative state   Final disposition: Unclear   55 minutes with more than 50% spent in reviewing records, counseling patient/family and coordinating care.   Sch Meds:  Scheduled Meds:  amLODipine   10 mg Per Tube Daily   artificial tears   Both Eyes BID   Chlorhexidine  Gluconate Cloth  6 each Topical Daily   enoxaparin  (LOVENOX ) injection  40 mg Subcutaneous Daily   famotidine   20 mg Per Tube BID   feeding supplement (JEVITY 1.5 CAL/FIBER)  237 mL Per Tube 5 X Daily   feeding supplement (PROSource TF20)  60 mL Per Tube TID   free water   150 mL Per Tube Q6H   insulin  aspart  0-9 Units Subcutaneous Q6H   metoprolol  tartrate  75 mg Per Tube BID   multivitamin with minerals  1 tablet Per Tube Daily   mouth rinse  15 mL Mouth Rinse Q2H   polyethylene glycol  17 g Per Tube Daily   Continuous Infusions: PRN Meds:.acetaminophen  **OR** acetaminophen  (TYLENOL ) oral liquid 160 mg/5 mL **OR** acetaminophen , bisacodyl , Gerhardt's butt cream, hydrALAZINE , ipratropium-albuterol , mouth rinse, traZODone   Antimicrobials: Anti-infectives (From admission, onward)    Start     Dose/Rate Route Frequency Ordered Stop   04/09/24 2200  linezolid  (ZYVOX ) tablet 600  mg  Status:  Discontinued        600 mg Per Tube Every 12 hours 04/09/24 1132 04/13/24 1332   04/09/24 1700  vancomycin  (VANCOREADY) IVPB 1500 mg/300 mL  Status:  Discontinued        1,500 mg 150 mL/hr over 120 Minutes Intravenous Every 12 hours 04/09/24 1108 04/09/24 1132   04/09/24 1000  linezolid  (ZYVOX ) IVPB 600 mg  Status:  Discontinued        600 mg 300 mL/hr over 60 Minutes Intravenous Every 12 hours 04/09/24 0527 04/09/24 1108   04/01/24 1145  ceFEPIme  (MAXIPIME ) 2 g in sodium chloride  0.9 % 100 mL IVPB        2 g 200 mL/hr over 30 Minutes Intravenous Every 8 hours 04/01/24 1137 04/14/24 2230   02/21/24 1000  vancomycin  (VANCOREADY) IVPB 1250 mg/250 mL        1,250 mg 166.7 mL/hr over 90 Minutes Intravenous Every 12 hours 02/20/24 2158 02/28/24 0130   02/16/24 1445  ceFEPIme  (MAXIPIME ) 2 g in sodium chloride  0.9 % 100 mL IVPB        2 g 200 mL/hr  over 30 Minutes Intravenous Every 8 hours 02/16/24 1348 02/27/24 2322   02/16/24 0945  vancomycin  (VANCOREADY) IVPB 1500 mg/300 mL  Status:  Discontinued        1,500 mg 150 mL/hr over 120 Minutes Intravenous Every 12 hours 02/16/24 0848 02/20/24 2158   02/15/24 1215  Ampicillin -Sulbactam (UNASYN ) 3 g in sodium chloride  0.9 % 100 mL IVPB  Status:  Discontinued        3 g 200 mL/hr over 30 Minutes Intravenous Every 6 hours 02/15/24 1125 02/16/24 1226   01/24/24 2000  vancomycin  (VANCOREADY) IVPB 1250 mg/250 mL        1,250 mg 166.7 mL/hr over 90 Minutes Intravenous Every 24 hours 01/24/24 1154 01/27/24 0125   01/22/24 2200  Vancomycin  (VANCOCIN ) 1,250 mg in sodium chloride  0.9 % 250 mL IVPB  Status:  Discontinued        1,250 mg 166.7 mL/hr over 90 Minutes Intravenous Every 24 hours 01/22/24 1013 01/22/24 1020   01/22/24 2000  vancomycin  (VANCOREADY) IVPB 1250 mg/250 mL  Status:  Discontinued        1,250 mg 166.7 mL/hr over 90 Minutes Intravenous Every 24 hours 01/22/24 1020 01/24/24 1154   01/20/24 2000  ceFEPIme  (MAXIPIME ) 2 g in  sodium chloride  0.9 % 100 mL IVPB  Status:  Discontinued        2 g 200 mL/hr over 30 Minutes Intravenous Every 8 hours 01/20/24 1900 01/24/24 1154   01/20/24 2000  Vancomycin  (VANCOCIN ) 1,250 mg in sodium chloride  0.9 % 250 mL IVPB  Status:  Discontinued        1,250 mg 166.7 mL/hr over 90 Minutes Intravenous Every 24 hours 01/20/24 1900 01/22/24 1013   01/03/24 1730  cefTRIAXone  (ROCEPHIN ) 1 g in sodium chloride  0.9 % 100 mL IVPB        1 g 200 mL/hr over 30 Minutes Intravenous Every 24 hours 01/03/24 1235 01/07/24 0710   01/02/24 1830  ceFEPIme  (MAXIPIME ) 2 g in sodium chloride  0.9 % 100 mL IVPB  Status:  Discontinued        2 g 200 mL/hr over 30 Minutes Intravenous Every 8 hours 01/02/24 1732 01/03/24 1235   01/02/24 1830  Vancomycin  (VANCOCIN ) 1,250 mg in sodium chloride  0.9 % 250 mL IVPB  Status:  Discontinued        1,250 mg 166.7 mL/hr over 90 Minutes Intravenous Every 24 hours 01/02/24 1732 01/04/24 1754   01/01/24 1200  cefTRIAXone  (ROCEPHIN ) 1 g in sodium chloride  0.9 % 100 mL IVPB  Status:  Discontinued        1 g 200 mL/hr over 30 Minutes Intravenous Every 24 hours 01/01/24 1057 01/02/24 1713   11/29/23 2200  linezolid  (ZYVOX ) tablet 600 mg        600 mg Per Tube Every 12 hours 11/29/23 1034 12/04/23 2215   11/28/23 1000  linezolid  (ZYVOX ) IVPB 600 mg  Status:  Discontinued        600 mg 300 mL/hr over 60 Minutes Intravenous Every 12 hours 11/28/23 0831 11/29/23 1034   11/23/23 1200  vancomycin  (VANCOREADY) IVPB 1250 mg/250 mL  Status:  Discontinued        1,250 mg 166.7 mL/hr over 90 Minutes Intravenous Every 24 hours 11/23/23 1036 11/28/23 0831   11/20/23 1100  vancomycin  (VANCOREADY) IVPB 1250 mg/250 mL  Status:  Discontinued        1,250 mg 166.7 mL/hr over 90 Minutes Intravenous Every 24 hours 11/20/23 0923 11/20/23 0923   11/20/23  1100  vancomycin  (VANCOREADY) IVPB 1250 mg/250 mL  Status:  Discontinued        1,250 mg 166.7 mL/hr over 90 Minutes Intravenous Every 24  hours 11/20/23 0923 11/23/23 1036   11/15/23 2300  vancomycin  (VANCOREADY) IVPB 1250 mg/250 mL        1,250 mg 166.7 mL/hr over 90 Minutes Intravenous Every 24 hours 11/15/23 2215 11/19/23 0924   11/12/23 2200  vancomycin  (VANCOREADY) IVPB 750 mg/150 mL  Status:  Discontinued        750 mg 150 mL/hr over 60 Minutes Intravenous Every 12 hours 11/12/23 0855 11/15/23 2215   11/12/23 0930  vancomycin  (VANCOREADY) IVPB 2000 mg/400 mL        2,000 mg 200 mL/hr over 120 Minutes Intravenous  Once 11/12/23 0838 11/12/23 1116   11/11/23 1345  Ampicillin -Sulbactam (UNASYN ) 3 g in sodium chloride  0.9 % 100 mL IVPB  Status:  Discontinued        3 g 200 mL/hr over 30 Minutes Intravenous Every 6 hours 11/11/23 1257 11/12/23 0836   10/28/23 0400  Ampicillin -Sulbactam (UNASYN ) 3 g in sodium chloride  0.9 % 100 mL IVPB  Status:  Discontinued        3 g 200 mL/hr over 30 Minutes Intravenous Every 6 hours 10/27/23 2151 11/02/23 0945   10/27/23 1845  vancomycin  (VANCOCIN ) IVPB 1000 mg/200 mL premix  Status:  Discontinued        1,000 mg 200 mL/hr over 60 Minutes Intravenous  Once 10/27/23 1843 10/27/23 1843   10/27/23 1845  ceFEPIme  (MAXIPIME ) 2 g in sodium chloride  0.9 % 100 mL IVPB        2 g 200 mL/hr over 30 Minutes Intravenous  Once 10/27/23 1843 10/27/23 2024   10/27/23 1845  vancomycin  (VANCOREADY) IVPB 2000 mg/400 mL        2,000 mg 200 mL/hr over 120 Minutes Intravenous  Once 10/27/23 1843 10/27/23 2123        I have personally reviewed the following labs and images: CBC: Recent Labs  Lab 05/10/24 1652 05/11/24 0205 05/12/24 1258 05/17/24 0935  WBC 10.5 9.9 10.9* 11.6*  NEUTROABS 7.7  --   --  9.4*  HGB 11.1* 10.0* 10.7* 10.3*  HCT 38.1* 34.3* 36.8* 34.9*  MCV 92.0 93.5 92.5 91.6  PLT 404* 367 364 368   BMP &GFR Recent Labs  Lab 05/10/24 1652 05/11/24 0205 05/12/24 1258  NA 148* 148* 143  K 4.1 4.2 3.6  CL 108 109 100  CO2 27 29 32  GLUCOSE 129* 115* 124*  BUN 27* 30* 23*   CREATININE 0.46* 0.49* 0.44*  CALCIUM 9.3 9.2 9.2  MG 2.3 2.3  --   PHOS  --  4.0  --    Estimated Creatinine Clearance: 128.2 mL/min (A) (by C-G formula based on SCr of 0.44 mg/dL (L)). Liver & Pancreas: Recent Labs  Lab 05/10/24 1652 05/11/24 0205 05/12/24 1258  AST 56* 53* 50*  ALT 107* 109* 115*  ALKPHOS 68 61 65  BILITOT 0.4 0.4 0.2  PROT 8.5* 7.5 7.9  ALBUMIN  3.3* 2.9* 3.0*   No results for input(s): LIPASE, AMYLASE in the last 168 hours. No results for input(s): AMMONIA in the last 168 hours. Diabetic: No results for input(s): HGBA1C in the last 72 hours. Recent Labs  Lab 05/16/24 1102 05/16/24 1707 05/16/24 1927 05/16/24 2321 05/17/24 1138  GLUCAP 159* 92 115* 129* 130*   Cardiac Enzymes: Recent Labs  Lab 05/12/24 1258  CKTOTAL 45*  No results for input(s): PROBNP in the last 8760 hours. Coagulation Profile: No results for input(s): INR, PROTIME in the last 168 hours. Thyroid Function Tests: No results for input(s): TSH, T4TOTAL, FREET4, T3FREE, THYROIDAB in the last 72 hours. Lipid Profile: No results for input(s): CHOL, HDL, LDLCALC, TRIG, CHOLHDL, LDLDIRECT in the last 72 hours. Anemia Panel: No results for input(s): VITAMINB12, FOLATE, FERRITIN, TIBC, IRON, RETICCTPCT in the last 72 hours. Urine analysis:    Component Value Date/Time   COLORURINE YELLOW 11/07/2023 0930   APPEARANCEUR CLEAR 11/07/2023 0930   LABSPEC 1.017 11/07/2023 0930   PHURINE 5.0 11/07/2023 0930   GLUCOSEU NEGATIVE 11/07/2023 0930   HGBUR NEGATIVE 11/07/2023 0930   BILIRUBINUR NEGATIVE 11/07/2023 0930   KETONESUR NEGATIVE 11/07/2023 0930   PROTEINUR NEGATIVE 11/07/2023 0930   NITRITE NEGATIVE 11/07/2023 0930   LEUKOCYTESUR NEGATIVE 11/07/2023 0930   Sepsis Labs: Invalid input(s): PROCALCITONIN, LACTICIDVEN  Microbiology: Recent Results (from the past 240 hours)  Culture, Respiratory w Gram Stain     Status: None    Collection Time: 05/10/24  2:56 PM   Specimen: Tracheal Aspirate; Respiratory  Result Value Ref Range Status   Specimen Description TRACHEAL ASPIRATE  Final   Special Requests NONE  Final   Gram Stain   Final    RARE WBC PRESENT, PREDOMINANTLY PMN MODERATE GRAM POSITIVE COCCI MODERATE GRAM NEGATIVE RODS FEW GRAM POSITIVE RODS Performed at [facility], [address] [place], [name] [address]    Culture ABUNDANT PSEUDOMONAS AERUGINOSA  Final   Report Status 05/12/2024 FINAL  Final   Organism ID, Bacteria PSEUDOMONAS AERUGINOSA  Final      Susceptibility   Pseudomonas aeruginosa - MIC*    MEROPENEM 1 SENSITIVE Sensitive     CIPROFLOXACIN  0.25 SENSITIVE Sensitive     IMIPENEM 2 SENSITIVE Sensitive     PIP/TAZO Value in next row Sensitive      <=4 SENSITIVEThis is a modified FDA-approved test that has been validated and its performance characteristics determined by the reporting laboratory.  This laboratory is certified under the Clinical Laboratory Improvement Amendments CLIA as qualified to perform high complexity clinical laboratory testing.    CEFEPIME  Value in next row Sensitive      <=4 SENSITIVEThis is a modified FDA-approved test that has been validated and its performance characteristics determined by the reporting laboratory.  This laboratory is certified under the Clinical Laboratory Improvement Amendments CLIA as qualified to perform high complexity clinical laboratory testing.    CEFTAZIDIME/AVIBACTAM Value in next row Sensitive      <=4 SENSITIVEThis is a modified FDA-approved test that has been validated and its performance characteristics determined by the reporting laboratory.  This laboratory is certified under the Clinical Laboratory Improvement Amendments CLIA as qualified to perform high complexity clinical laboratory testing.    CEFTOLOZANE/TAZOBACTAM Value in next row Sensitive      <=4 SENSITIVEThis is a modified FDA-approved test that has been validated and  its performance characteristics determined by the reporting laboratory.  This laboratory is certified under the Clinical Laboratory Improvement Amendments CLIA as qualified to perform high complexity clinical laboratory testing.    TOBRAMYCIN Value in next row Sensitive      <=4 SENSITIVEThis is a modified FDA-approved test that has been validated and its performance characteristics determined by the reporting laboratory.  This laboratory is certified under the Clinical Laboratory Improvement Amendments CLIA as qualified to perform high complexity clinical laboratory testing.    CEFTAZIDIME Value in next row Sensitive      <=  4 SENSITIVEThis is a modified FDA-approved test that has been validated and its performance characteristics determined by the reporting laboratory.  This laboratory is certified under the Clinical Laboratory Improvement Amendments CLIA as qualified to perform high complexity clinical laboratory testing.    * ABUNDANT PSEUDOMONAS AERUGINOSA  Culture, blood (Routine X 2) w Reflex to ID Panel     Status: None   Collection Time: 05/10/24  4:47 PM   Specimen: BLOOD RIGHT HAND  Result Value Ref Range Status   Specimen Description BLOOD RIGHT HAND  Final   Special Requests   Final    BOTTLES DRAWN AEROBIC AND ANAEROBIC Blood Culture adequate volume   Culture   Final    NO GROWTH 5 DAYS Performed at [facility], [address] [place], [name] [address]    Report Status 05/15/2024 FINAL  Final  Culture, blood (Routine X 2) w Reflex to ID Panel     Status: None   Collection Time: 05/10/24  4:52 PM   Specimen: BLOOD LEFT ARM  Result Value Ref Range Status   Specimen Description BLOOD LEFT ARM  Final   Special Requests   Final    BOTTLES DRAWN AEROBIC AND ANAEROBIC Blood Culture results may not be optimal due to an inadequate volume of blood received in culture bottles   Culture   Final    NO GROWTH 5 DAYS Performed at [facility], [address], [place],  [name] [address]    Report Status 05/15/2024 FINAL  Final    Radiology Studies: No results found.   Alm Apo, MD [facility] 05/17/2024, 1:17 PM  If 7PM-7AM, please contact night-coverage [email or web address] 05/17/2024, 1:17 PM

## 2024-05-17 NOTE — Plan of Care (Signed)
  Problem: Fluid Volume: Goal: Ability to maintain a balanced intake and output will improve Outcome: Progressing   Problem: Skin Integrity: Goal: Risk for impaired skin integrity will decrease Outcome: Progressing   Problem: Clinical Measurements: Goal: Will remain free from infection Outcome: Progressing   Problem: Clinical Measurements: Goal: Cardiovascular complication will be avoided Outcome: Progressing

## 2024-05-17 NOTE — Plan of Care (Signed)
  Problem: Nutrition: Goal: Risk of aspiration will decrease Outcome: Progressing Goal: Dietary intake will improve Outcome: Progressing   Problem: Intracerebral Hemorrhage Tissue Perfusion: Goal: Complications of Intracerebral Hemorrhage will be minimized Outcome: Progressing   Problem: Intracerebral Hemorrhage Tissue Perfusion: Goal: Complications of Intracerebral Hemorrhage will be minimized Outcome: Not Progressing   Problem: Education: Goal: Knowledge of disease or condition will improve Outcome: Not Progressing   Problem: Self-Care: Goal: Ability to participate in self-care as condition permits will improve Outcome: Not Progressing Goal: Verbalization of feelings and concerns over difficulty with self-care will improve Outcome: Not Progressing Goal: Ability to communicate needs accurately will improve Outcome: Not Progressing

## 2024-05-18 DIAGNOSIS — J9601 Acute respiratory failure with hypoxia: Secondary | ICD-10-CM | POA: Diagnosis not present

## 2024-05-18 DIAGNOSIS — Z789 Other specified health status: Secondary | ICD-10-CM | POA: Diagnosis not present

## 2024-05-18 DIAGNOSIS — Z7189 Other specified counseling: Secondary | ICD-10-CM | POA: Diagnosis not present

## 2024-05-18 DIAGNOSIS — I619 Nontraumatic intracerebral hemorrhage, unspecified: Secondary | ICD-10-CM | POA: Diagnosis not present

## 2024-05-18 DIAGNOSIS — J9611 Chronic respiratory failure with hypoxia: Secondary | ICD-10-CM | POA: Diagnosis not present

## 2024-05-18 NOTE — Plan of Care (Signed)
 Pt unable to verbalize needs, but appears calm, with no signs of acute distress. Pt with tracheostomy with vent support at night. Bolus TF and free water  given. Foley in place.   Problem: Nutrition: Goal: Risk of aspiration will decrease Outcome: Progressing   Problem: Fluid Volume: Goal: Ability to maintain a balanced intake and output will improve Outcome: Progressing   Problem: Nutrition: Goal: Risk of aspiration will decrease Outcome: Progressing   Problem: Self-Care: Goal: Ability to participate in self-care as condition permits will improve Outcome: Progressing

## 2024-05-18 NOTE — Progress Notes (Signed)
 PROGRESS NOTE  Garrett [name] FMW:[id number] DOB: 05-23-1976   PCP: Pcp, No  Patient is from: Home  DOA: 10/27/2023 LOS: 204  Chief complaints Chief Complaint  Patient presents with   Unresponsive     Brief Narrative / Interim history: 48 y/o male with past medical history of hypertension was found down at home agonal breathing. History of uncontrolled hypertension but never had been to the doctor. Patient was noted to have a large (4.1 cm) hemorrhage centered in the pons with intraventricular extension into the fourth ventricle and also extension into the basal cisterns and trace additional subarachnoid hemorrhage along the left parietal convexity. BP in ED was 262/156. CXR revealed RUL and perihilar infiltrates suggesting aspiration pneumonia. ED labs also revealed PH 7.169, LA 4.4, CPK 985. Patient was intubated in ED and was admitted hospital.    Patient had prolonged complicated hospital stay pontine hemorrhage, biparietal SAH.  Patient's mental status poor, s/p tracheostomy and PEG placement.  Sputum cultures positive for MRSA on contact precautions.  Patient had prolonged difficulty weaning from vent.  Patient was on trach collar for few days but later required ventilatory support mostly at nights and is currently in ICU.  Subjective: No events overnight. Awake in bed, right eye open, no tracking.   Objective: Vitals:   05/18/24 1100 05/18/24 1143 05/18/24 1200 05/18/24 1208  BP:    132/87  Pulse: 72 77 70 71  Resp: (!) 21 (!) 22 (!) 27 (!) 24  Temp:  98.5 F (36.9 C)    TempSrc:  Axillary    SpO2: 100% 99% 99% 100%  Weight:      Height:        Examination: Constitutional:      Comments: Laying in bed in no distress.  Right eye open and wandering but no tracking or blink reflex HENT:     Head:     Mouth/Throat:     Mouth: Mucous membranes are dry.  Eyes:     Comments: Intermittent right eye movement at times with no consistent tracking or blink reflex. Left  eye opaque Cardiovascular:     Rate and Rhythm: Normal rate and regular rhythm.  Pulmonary:     Comments: regular respi pattern Abdominal:     General: There is no distension.     Palpations: Abdomen is soft.  Musculoskeletal:        General: Normal range of motion. Skin:    General: Skin is warm and dry.  Neurological:     Comments: Does not interact or follow commands.  Occasionally opens R eye with no meaningful purpose that can be repeated consistently. 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  Not a candidate for decannulation given ineffective airway clearance, -Continue trach collar during day with vent support at night. On and off tachypnea persists. -Continue trach care per protocol. -Pulmonary follow up appreciated.    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 Waylan evaluated and placed tarsorraphy.   -8/29-ophtho rec: Erythromycin  eyedrop q2h, tape eyes with paper tape if not blinking. -9/14, ophtho, Dr. Leane reengaged due  to worsening conjunctivitis and clouding I recommended second antibiotic, gatifloxacin  4 times daily.per Optho, no epithelial defects noted. - 9/25: courses completed of abx drops; continue on opth ointment and cover left eye with patch and/or tape to keep lid closed    SIRS: Blood cultures NGTD.  CXR unrevealing.  No leukocytosis.  Resp culture with Pseudomonas which could be just colonization since fever resolved without antibiotics.  No report of increased  secretion. -Monitor off antibiotics  Hypernatremia: Resolved. - Continue free water  at 150 cc every 6 hours. - Intermittently monitor labs  Elevated liver enzymes: Mild.  Unclear etiology.  CK normal. - Check intermittently.  Inadequate oral intake - PEG replaced by IR on 05/15/24 after becoming dislodged again Body mass index is 32.18 kg/m. Nutrition Problem: Inadequate oral intake Etiology: inability to eat Signs/Symptoms: NPO status Interventions: Tube feeding, Prostat, MVI, Juven   DVT prophylaxis:  enoxaparin  (LOVENOX ) injection 40 mg Start: 03/06/24 1000 SCDs Start: 10/27/23 2226  Code Status: Full code Family Communication: None at bedside Level of care: ICU Status is: Inpatient Remains inpatient appropriate because: Intracranial hemorrhage, vegetative state   Final disposition: Unclear   55 minutes with more than 50% spent in reviewing records, counseling patient/family and coordinating care.   Sch Meds:  Scheduled Meds:  amLODipine   10 mg Per Tube Daily   artificial tears   Both Eyes BID   Chlorhexidine  Gluconate Cloth  6 each Topical Daily   enoxaparin  (LOVENOX ) injection  40 mg Subcutaneous Daily   famotidine   20 mg Per Tube BID   feeding supplement (JEVITY 1.5 CAL/FIBER)  237 mL Per Tube 5 X Daily   feeding supplement (PROSource TF20)  60 mL Per Tube TID   free water   150 mL Per Tube Q6H   insulin  aspart  0-9 Units Subcutaneous Q6H   metoprolol  tartrate  75 mg Per Tube BID   multivitamin with minerals  1 tablet Per Tube Daily   mouth rinse  15 mL Mouth Rinse Q2H   polyethylene glycol  17 g Per Tube Daily   Continuous Infusions: PRN Meds:.acetaminophen  **OR** acetaminophen  (TYLENOL ) oral liquid 160 mg/5 mL **OR** acetaminophen , bisacodyl , Gerhardt's butt cream, hydrALAZINE , ipratropium-albuterol , mouth rinse, traZODone   Antimicrobials: Anti-infectives (From admission, onward)    Start     Dose/Rate Route Frequency Ordered Stop   04/09/24 2200   linezolid  (ZYVOX ) tablet 600 mg  Status:  Discontinued        600 mg Per Tube Every 12 hours 04/09/24 1132 04/13/24 1332   04/09/24 1700  vancomycin  (VANCOREADY) IVPB 1500 mg/300 mL  Status:  Discontinued        1,500 mg 150 mL/hr over 120 Minutes Intravenous Every 12 hours 04/09/24 1108 04/09/24 1132   04/09/24 1000  linezolid  (ZYVOX ) IVPB 600 mg  Status:  Discontinued        600 mg 300 mL/hr over 60 Minutes Intravenous Every 12 hours 04/09/24 0527 04/09/24 1108   04/01/24 1145  ceFEPIme  (MAXIPIME ) 2 g in sodium chloride  0.9 % 100 mL IVPB        2 g 200 mL/hr over 30 Minutes Intravenous Every 8 hours 04/01/24 1137 04/14/24 2230   02/21/24 1000  vancomycin  (VANCOREADY) IVPB 1250 mg/250 mL        1,250 mg 166.7 mL/hr over 90 Minutes Intravenous Every 12 hours 02/20/24 2158 02/28/24 0130   02/16/24 1445  ceFEPIme  (MAXIPIME ) 2 g in sodium chloride  0.9 % 100 mL IVPB        2  g 200 mL/hr over 30 Minutes Intravenous Every 8 hours 02/16/24 1348 02/27/24 2322   02/16/24 0945  vancomycin  (VANCOREADY) IVPB 1500 mg/300 mL  Status:  Discontinued        1,500 mg 150 mL/hr over 120 Minutes Intravenous Every 12 hours 02/16/24 0848 02/20/24 2158   02/15/24 1215  Ampicillin -Sulbactam (UNASYN ) 3 g in sodium chloride  0.9 % 100 mL IVPB  Status:  Discontinued        3 g 200 mL/hr over 30 Minutes Intravenous Every 6 hours 02/15/24 1125 02/16/24 1226   01/24/24 2000  vancomycin  (VANCOREADY) IVPB 1250 mg/250 mL        1,250 mg 166.7 mL/hr over 90 Minutes Intravenous Every 24 hours 01/24/24 1154 01/27/24 0125   01/22/24 2200  Vancomycin  (VANCOCIN ) 1,250 mg in sodium chloride  0.9 % 250 mL IVPB  Status:  Discontinued        1,250 mg 166.7 mL/hr over 90 Minutes Intravenous Every 24 hours 01/22/24 1013 01/22/24 1020   01/22/24 2000  vancomycin  (VANCOREADY) IVPB 1250 mg/250 mL  Status:  Discontinued        1,250 mg 166.7 mL/hr over 90 Minutes Intravenous Every 24 hours 01/22/24 1020 01/24/24 1154   01/20/24 2000   ceFEPIme  (MAXIPIME ) 2 g in sodium chloride  0.9 % 100 mL IVPB  Status:  Discontinued        2 g 200 mL/hr over 30 Minutes Intravenous Every 8 hours 01/20/24 1900 01/24/24 1154   01/20/24 2000  Vancomycin  (VANCOCIN ) 1,250 mg in sodium chloride  0.9 % 250 mL IVPB  Status:  Discontinued        1,250 mg 166.7 mL/hr over 90 Minutes Intravenous Every 24 hours 01/20/24 1900 01/22/24 1013   01/03/24 1730  cefTRIAXone  (ROCEPHIN ) 1 g in sodium chloride  0.9 % 100 mL IVPB        1 g 200 mL/hr over 30 Minutes Intravenous Every 24 hours 01/03/24 1235 01/07/24 0710   01/02/24 1830  ceFEPIme  (MAXIPIME ) 2 g in sodium chloride  0.9 % 100 mL IVPB  Status:  Discontinued        2 g 200 mL/hr over 30 Minutes Intravenous Every 8 hours 01/02/24 1732 01/03/24 1235   01/02/24 1830  Vancomycin  (VANCOCIN ) 1,250 mg in sodium chloride  0.9 % 250 mL IVPB  Status:  Discontinued        1,250 mg 166.7 mL/hr over 90 Minutes Intravenous Every 24 hours 01/02/24 1732 01/04/24 1754   01/01/24 1200  cefTRIAXone  (ROCEPHIN ) 1 g in sodium chloride  0.9 % 100 mL IVPB  Status:  Discontinued        1 g 200 mL/hr over 30 Minutes Intravenous Every 24 hours 01/01/24 1057 01/02/24 1713   11/29/23 2200  linezolid  (ZYVOX ) tablet 600 mg        600 mg Per Tube Every 12 hours 11/29/23 1034 12/04/23 2215   11/28/23 1000  linezolid  (ZYVOX ) IVPB 600 mg  Status:  Discontinued        600 mg 300 mL/hr over 60 Minutes Intravenous Every 12 hours 11/28/23 0831 11/29/23 1034   11/23/23 1200  vancomycin  (VANCOREADY) IVPB 1250 mg/250 mL  Status:  Discontinued        1,250 mg 166.7 mL/hr over 90 Minutes Intravenous Every 24 hours 11/23/23 1036 11/28/23 0831   11/20/23 1100  vancomycin  (VANCOREADY) IVPB 1250 mg/250 mL  Status:  Discontinued        1,250 mg 166.7 mL/hr over 90 Minutes Intravenous Every 24 hours 11/20/23 0923 11/20/23 9076  11/20/23 1100  vancomycin  (VANCOREADY) IVPB 1250 mg/250 mL  Status:  Discontinued        1,250 mg 166.7 mL/hr over 90  Minutes Intravenous Every 24 hours 11/20/23 0923 11/23/23 1036   11/15/23 2300  vancomycin  (VANCOREADY) IVPB 1250 mg/250 mL        1,250 mg 166.7 mL/hr over 90 Minutes Intravenous Every 24 hours 11/15/23 2215 11/19/23 0924   11/12/23 2200  vancomycin  (VANCOREADY) IVPB 750 mg/150 mL  Status:  Discontinued        750 mg 150 mL/hr over 60 Minutes Intravenous Every 12 hours 11/12/23 0855 11/15/23 2215   11/12/23 0930  vancomycin  (VANCOREADY) IVPB 2000 mg/400 mL        2,000 mg 200 mL/hr over 120 Minutes Intravenous  Once 11/12/23 0838 11/12/23 1116   11/11/23 1345  Ampicillin -Sulbactam (UNASYN ) 3 g in sodium chloride  0.9 % 100 mL IVPB  Status:  Discontinued        3 g 200 mL/hr over 30 Minutes Intravenous Every 6 hours 11/11/23 1257 11/12/23 0836   10/28/23 0400  Ampicillin -Sulbactam (UNASYN ) 3 g in sodium chloride  0.9 % 100 mL IVPB  Status:  Discontinued        3 g 200 mL/hr over 30 Minutes Intravenous Every 6 hours 10/27/23 2151 11/02/23 0945   10/27/23 1845  vancomycin  (VANCOCIN ) IVPB 1000 mg/200 mL premix  Status:  Discontinued        1,000 mg 200 mL/hr over 60 Minutes Intravenous  Once 10/27/23 1843 10/27/23 1843   10/27/23 1845  ceFEPIme  (MAXIPIME ) 2 g in sodium chloride  0.9 % 100 mL IVPB        2 g 200 mL/hr over 30 Minutes Intravenous  Once 10/27/23 1843 10/27/23 2024   10/27/23 1845  vancomycin  (VANCOREADY) IVPB 2000 mg/400 mL        2,000 mg 200 mL/hr over 120 Minutes Intravenous  Once 10/27/23 1843 10/27/23 2123        I have personally reviewed the following labs and images: CBC: Recent Labs  Lab 05/12/24 1258 05/17/24 0935  WBC 10.9* 11.6*  NEUTROABS  --  9.4*  HGB 10.7* 10.3*  HCT 36.8* 34.9*  MCV 92.5 91.6  PLT 364 368   BMP &GFR Recent Labs  Lab 05/12/24 1258  NA 143  K 3.6  CL 100  CO2 32  GLUCOSE 124*  BUN 23*  CREATININE 0.44*  CALCIUM 9.2   Estimated Creatinine Clearance: 128.2 mL/min (A) (by C-G formula based on SCr of 0.44 mg/dL (L)). Liver &  Pancreas: Recent Labs  Lab 05/12/24 1258  AST 50*  ALT 115*  ALKPHOS 65  BILITOT 0.2  PROT 7.9  ALBUMIN  3.0*   No results for input(s): LIPASE, AMYLASE in the last 168 hours. No results for input(s): AMMONIA in the last 168 hours. Diabetic: No results for input(s): HGBA1C in the last 72 hours. Recent Labs  Lab 05/16/24 2321 05/17/24 1138 05/17/24 1715 05/17/24 1924 05/17/24 2314  GLUCAP 129* 130* 105* 120* 136*   Cardiac Enzymes: Recent Labs  Lab 05/12/24 1258  CKTOTAL 45*   No results for input(s): PROBNP in the last 8760 hours. Coagulation Profile: No results for input(s): INR, PROTIME in the last 168 hours. Thyroid Function Tests: No results for input(s): TSH, T4TOTAL, FREET4, T3FREE, THYROIDAB in the last 72 hours. Lipid Profile: No results for input(s): CHOL, HDL, LDLCALC, TRIG, CHOLHDL, LDLDIRECT in the last 72 hours. Anemia Panel: No results for input(s): VITAMINB12, FOLATE, FERRITIN, TIBC, IRON,  RETICCTPCT in the last 72 hours. Urine analysis:    Component Value Date/Time   COLORURINE YELLOW 11/07/2023 0930   APPEARANCEUR CLEAR 11/07/2023 0930   LABSPEC 1.017 11/07/2023 0930   PHURINE 5.0 11/07/2023 0930   GLUCOSEU NEGATIVE 11/07/2023 0930   HGBUR NEGATIVE 11/07/2023 0930   BILIRUBINUR NEGATIVE 11/07/2023 0930   KETONESUR NEGATIVE 11/07/2023 0930   PROTEINUR NEGATIVE 11/07/2023 0930   NITRITE NEGATIVE 11/07/2023 0930   LEUKOCYTESUR NEGATIVE 11/07/2023 0930   Sepsis Labs: Invalid input(s): PROCALCITONIN, LACTICIDVEN  Microbiology: Recent Results (from the past 240 hours)  Culture, Respiratory w Gram Stain     Status: None   Collection Time: 05/10/24  2:56 PM   Specimen: Tracheal Aspirate; Respiratory  Result Value Ref Range Status   Specimen Description TRACHEAL ASPIRATE  Final   Special Requests NONE  Final   Gram Stain   Final    RARE WBC PRESENT, PREDOMINANTLY PMN MODERATE GRAM POSITIVE  COCCI MODERATE GRAM NEGATIVE RODS FEW GRAM POSITIVE RODS Performed at [facility], [address] [place], [name] [address]    Culture ABUNDANT PSEUDOMONAS AERUGINOSA  Final   Report Status 05/12/2024 FINAL  Final   Organism ID, Bacteria PSEUDOMONAS AERUGINOSA  Final      Susceptibility   Pseudomonas aeruginosa - MIC*    MEROPENEM 1 SENSITIVE Sensitive     CIPROFLOXACIN  0.25 SENSITIVE Sensitive     IMIPENEM 2 SENSITIVE Sensitive     PIP/TAZO Value in next row Sensitive      <=4 SENSITIVEThis is a modified FDA-approved test that has been validated and its performance characteristics determined by the reporting laboratory.  This laboratory is certified under the Clinical Laboratory Improvement Amendments CLIA as qualified to perform high complexity clinical laboratory testing.    CEFEPIME  Value in next row Sensitive      <=4 SENSITIVEThis is a modified FDA-approved test that has been validated and its performance characteristics determined by the reporting laboratory.  This laboratory is certified under the Clinical Laboratory Improvement Amendments CLIA as qualified to perform high complexity clinical laboratory testing.    CEFTAZIDIME/AVIBACTAM Value in next row Sensitive      <=4 SENSITIVEThis is a modified FDA-approved test that has been validated and its performance characteristics determined by the reporting laboratory.  This laboratory is certified under the Clinical Laboratory Improvement Amendments CLIA as qualified to perform high complexity clinical laboratory testing.    CEFTOLOZANE/TAZOBACTAM Value in next row Sensitive      <=4 SENSITIVEThis is a modified FDA-approved test that has been validated and its performance characteristics determined by the reporting laboratory.  This laboratory is certified under the Clinical Laboratory Improvement Amendments CLIA as qualified to perform high complexity clinical laboratory testing.    TOBRAMYCIN Value in next row Sensitive       <=4 SENSITIVEThis is a modified FDA-approved test that has been validated and its performance characteristics determined by the reporting laboratory.  This laboratory is certified under the Clinical Laboratory Improvement Amendments CLIA as qualified to perform high complexity clinical laboratory testing.    CEFTAZIDIME Value in next row Sensitive      <=4 SENSITIVEThis is a modified FDA-approved test that has been validated and its performance characteristics determined by the reporting laboratory.  This laboratory is certified under the Clinical Laboratory Improvement Amendments CLIA as qualified to perform high complexity clinical laboratory testing.    * ABUNDANT PSEUDOMONAS AERUGINOSA  Culture, blood (Routine X 2) w Reflex to ID Panel  Status: None   Collection Time: 05/10/24  4:47 PM   Specimen: BLOOD RIGHT HAND  Result Value Ref Range Status   Specimen Description BLOOD RIGHT HAND  Final   Special Requests   Final    BOTTLES DRAWN AEROBIC AND ANAEROBIC Blood Culture adequate volume   Culture   Final    NO GROWTH 5 DAYS Performed at [facility], [address], [place], [name] [address]    Report Status 05/15/2024 FINAL  Final  Culture, blood (Routine X 2) w Reflex to ID Panel     Status: None   Collection Time: 05/10/24  4:52 PM   Specimen: BLOOD LEFT ARM  Result Value Ref Range Status   Specimen Description BLOOD LEFT ARM  Final   Special Requests   Final    BOTTLES DRAWN AEROBIC AND ANAEROBIC Blood Culture results may not be optimal due to an inadequate volume of blood received in culture bottles   Culture   Final    NO GROWTH 5 DAYS Performed at [facility], [address], [place], [name] [address]    Report Status 05/15/2024 FINAL  Final    Radiology Studies: No results found.   Alm Apo, MD [facility] 05/18/2024, 2:46 PM  If 7PM-7AM, please contact night-coverage [email or web address] 05/18/2024, 2:46 PM

## 2024-05-19 DIAGNOSIS — Z930 Tracheostomy status: Secondary | ICD-10-CM | POA: Diagnosis not present

## 2024-05-19 DIAGNOSIS — J9621 Acute and chronic respiratory failure with hypoxia: Secondary | ICD-10-CM | POA: Diagnosis not present

## 2024-05-19 DIAGNOSIS — I619 Nontraumatic intracerebral hemorrhage, unspecified: Secondary | ICD-10-CM | POA: Diagnosis not present

## 2024-05-19 DIAGNOSIS — J9611 Chronic respiratory failure with hypoxia: Secondary | ICD-10-CM | POA: Diagnosis not present

## 2024-05-19 DIAGNOSIS — Z7189 Other specified counseling: Secondary | ICD-10-CM | POA: Diagnosis not present

## 2024-05-19 DIAGNOSIS — J9622 Acute and chronic respiratory failure with hypercapnia: Secondary | ICD-10-CM | POA: Diagnosis not present

## 2024-05-19 DIAGNOSIS — Z789 Other specified health status: Secondary | ICD-10-CM | POA: Diagnosis not present

## 2024-05-19 LAB — GLUCOSE, CAPILLARY
Glucose-Capillary: 101 mg/dL — ABNORMAL HIGH (ref 70–99)
Glucose-Capillary: 101 mg/dL — ABNORMAL HIGH (ref 70–99)
Glucose-Capillary: 103 mg/dL — ABNORMAL HIGH (ref 70–99)
Glucose-Capillary: 105 mg/dL — ABNORMAL HIGH (ref 70–99)
Glucose-Capillary: 106 mg/dL — ABNORMAL HIGH (ref 70–99)
Glucose-Capillary: 107 mg/dL — ABNORMAL HIGH (ref 70–99)
Glucose-Capillary: 130 mg/dL — ABNORMAL HIGH (ref 70–99)
Glucose-Capillary: 146 mg/dL — ABNORMAL HIGH (ref 70–99)
Glucose-Capillary: 151 mg/dL — ABNORMAL HIGH (ref 70–99)
Glucose-Capillary: 90 mg/dL (ref 70–99)

## 2024-05-19 MED ORDER — POLYMYXIN B-TRIMETHOPRIM 10000-0.1 UNIT/ML-% OP SOLN
2.0000 [drp] | Freq: Four times a day (QID) | OPHTHALMIC | Status: AC
  Administered 2024-05-19 – 2024-05-24 (×20): 2 [drp] via OPHTHALMIC
  Filled 2024-05-19: qty 10

## 2024-05-19 NOTE — Progress Notes (Signed)
 PROGRESS NOTE  Garrett [name] FMW:[id number] DOB: 04-Mar-1976   PCP: Pcp, No  Patient is from: Home  DOA: 10/27/2023 LOS: 205  Chief complaints Chief Complaint  Patient presents with   Unresponsive     Brief Narrative / Interim history: 48 y/o male with past medical history of hypertension was found down at home agonal breathing. History of uncontrolled hypertension but never had been to the doctor. Patient was noted to have a large (4.1 cm) hemorrhage centered in the pons with intraventricular extension into the fourth ventricle and also extension into the basal cisterns and trace additional subarachnoid hemorrhage along the left parietal convexity. BP in ED was 262/156. CXR revealed RUL and perihilar infiltrates suggesting aspiration pneumonia. ED labs also revealed PH 7.169, LA 4.4, CPK 985. Patient was intubated in ED and was admitted hospital.    Patient had prolonged complicated hospital stay pontine hemorrhage, biparietal SAH.  Patient's mental status poor, s/p tracheostomy and PEG placement.  Sputum cultures positive for MRSA on contact precautions.  Patient had prolonged difficulty weaning from vent.  Patient was on trach collar for few days but later required ventilatory support mostly at nights and is currently in ICU.  Subjective: No events overnight. Awake in bed, right eye open, no tracking. Left eye red again with some discharge.    Objective: Vitals:   05/19/24 0900 05/19/24 1000 05/19/24 1100 05/19/24 1151  BP: 127/85 (!) 131/96    Pulse: 63 64 64   Resp: (!) 23 19    Temp:    97.8 F (36.6 C)  TempSrc:    Axillary  SpO2: 96% 97% 97%   Weight:      Height:        Examination: Constitutional:      Comments: Laying in bed in no distress.  Right eye open and wandering but no tracking or blink reflex HENT:     Head:     Mouth/Throat:     Mouth: Mucous membranes are dry.  Eyes:     Comments: Intermittent right eye movement at times with no consistent  tracking or blink reflex. Left eye opaque (more injected appearing with some discharge noted) Cardiovascular:     Rate and Rhythm: Normal rate and regular rhythm.  Pulmonary:     Comments: regular respi pattern Abdominal:     General: There is no distension.     Palpations: Abdomen is soft.  Musculoskeletal:        General: Normal range of motion. Skin:    General: Skin is warm and dry.  Neurological:     Comments: Does not interact or follow commands.  Occasionally opens R eye with no meaningful purpose that can be repeated consistently. 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  Not a candidate for decannulation given ineffective airway clearance, -Continue trach collar during day with vent support at night. On and off tachypnea persists. -Continue trach care per protocol. -Pulmonary follow up appreciated.    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 Waylan evaluated and placed tarsorraphy.   -8/29-ophtho rec: Erythromycin  eyedrop q2h, tape  eyes with paper tape if not blinking. -9/14, ophtho, Dr. Leane reengaged due to worsening conjunctivitis and clouding I recommended second antibiotic, gatifloxacin  4 times daily.per Optho, no epithelial defects noted. - 9/25: courses completed of abx drops; continue on opth ointment and cover left eye with patch and/or tape to keep lid closed  - 9/29, more injected with discharge again in OS, resume 5 day course of Polytrim  eyedrops to left eye   Pseudomonal  colonization -Trach culture aspirate obtained on 05/10/2024 due to SIRS criteria; fever defervesced without antibiotics and no increase in secretions or decline clinically  -Continue to evaluate any recurrent SIRS criteria in clinical context  Hypernatremia: Resolved. - Continue free water  at 150 cc every 6 hours. - Intermittently monitor labs  Elevated liver enzymes: Mild.  Unclear etiology.  CK normal. - Check intermittently.  Inadequate oral intake - PEG replaced by IR on 05/15/24 after becoming dislodged again Body mass index is 31.51 kg/m. Nutrition Problem: Inadequate oral intake Etiology: inability to eat Signs/Symptoms: NPO status Interventions: Tube feeding, Prostat, MVI, Juven   DVT prophylaxis:  enoxaparin  (LOVENOX ) injection 40 mg Start: 03/06/24 1000 SCDs Start: 10/27/23 2226  Code Status: Full code Family Communication: None at bedside Level of care: ICU Status is: Inpatient Remains inpatient appropriate because: Intracranial hemorrhage, vegetative state   Final disposition: Unclear   55 minutes with more than 50% spent in reviewing records, counseling patient/family and coordinating care.   Sch Meds:  Scheduled Meds:  amLODipine   10 mg Per Tube Daily   artificial tears   Both Eyes BID   Chlorhexidine  Gluconate Cloth  6 each Topical Daily   enoxaparin  (LOVENOX ) injection  40 mg Subcutaneous Daily   famotidine   20 mg Per Tube BID   feeding supplement (JEVITY 1.5 CAL/FIBER)  237 mL Per Tube 5 X Daily   feeding supplement (PROSource TF20)  60 mL Per Tube TID   free water   150 mL Per Tube Q6H   insulin  aspart  0-9 Units Subcutaneous Q6H   metoprolol  tartrate  75 mg Per Tube BID   multivitamin with minerals  1 tablet Per Tube Daily   mouth rinse  15 mL Mouth Rinse Q2H   polyethylene glycol  17 g Per Tube Daily   Continuous Infusions: PRN Meds:.acetaminophen  **OR** acetaminophen  (TYLENOL ) oral liquid 160 mg/5 mL **OR** acetaminophen , bisacodyl , Gerhardt's  butt cream, hydrALAZINE , ipratropium-albuterol , mouth rinse, traZODone   Antimicrobials: Anti-infectives (From admission, onward)    Start     Dose/Rate Route Frequency Ordered Stop   04/09/24 2200  linezolid  (ZYVOX ) tablet 600 mg  Status:  Discontinued        600 mg Per Tube Every 12 hours 04/09/24 1132 04/13/24 1332   04/09/24 1700  vancomycin  (VANCOREADY) IVPB 1500 mg/300 mL  Status:  Discontinued        1,500 mg 150 mL/hr over 120 Minutes Intravenous Every 12 hours 04/09/24 1108 04/09/24 1132   04/09/24 1000  linezolid  (ZYVOX ) IVPB 600 mg  Status:  Discontinued        600 mg 300 mL/hr over 60 Minutes Intravenous Every 12 hours 04/09/24 0527 04/09/24 1108   04/01/24 1145  ceFEPIme  (MAXIPIME ) 2 g in sodium chloride  0.9 % 100 mL IVPB        2 g 200 mL/hr over 30 Minutes Intravenous Every 8 hours 04/01/24 1137 04/14/24 2230   02/21/24 1000  vancomycin  (VANCOREADY) IVPB 1250 mg/250 mL        1,250 mg 166.7 mL/hr over 90 Minutes  Intravenous Every 12 hours 02/20/24 2158 02/28/24 0130   02/16/24 1445  ceFEPIme  (MAXIPIME ) 2 g in sodium chloride  0.9 % 100 mL IVPB        2 g 200 mL/hr over 30 Minutes Intravenous Every 8 hours 02/16/24 1348 02/27/24 2322   02/16/24 0945  vancomycin  (VANCOREADY) IVPB 1500 mg/300 mL  Status:  Discontinued        1,500 mg 150 mL/hr over 120 Minutes Intravenous Every 12 hours 02/16/24 0848 02/20/24 2158   02/15/24 1215  Ampicillin -Sulbactam (UNASYN ) 3 g in sodium chloride  0.9 % 100 mL IVPB  Status:  Discontinued        3 g 200 mL/hr over 30 Minutes Intravenous Every 6 hours 02/15/24 1125 02/16/24 1226   01/24/24 2000  vancomycin  (VANCOREADY) IVPB 1250 mg/250 mL        1,250 mg 166.7 mL/hr over 90 Minutes Intravenous Every 24 hours 01/24/24 1154 01/27/24 0125   01/22/24 2200  Vancomycin  (VANCOCIN ) 1,250 mg in sodium chloride  0.9 % 250 mL IVPB  Status:  Discontinued        1,250 mg 166.7 mL/hr over 90 Minutes Intravenous Every 24 hours 01/22/24 1013 01/22/24 1020    01/22/24 2000  vancomycin  (VANCOREADY) IVPB 1250 mg/250 mL  Status:  Discontinued        1,250 mg 166.7 mL/hr over 90 Minutes Intravenous Every 24 hours 01/22/24 1020 01/24/24 1154   01/20/24 2000  ceFEPIme  (MAXIPIME ) 2 g in sodium chloride  0.9 % 100 mL IVPB  Status:  Discontinued        2 g 200 mL/hr over 30 Minutes Intravenous Every 8 hours 01/20/24 1900 01/24/24 1154   01/20/24 2000  Vancomycin  (VANCOCIN ) 1,250 mg in sodium chloride  0.9 % 250 mL IVPB  Status:  Discontinued        1,250 mg 166.7 mL/hr over 90 Minutes Intravenous Every 24 hours 01/20/24 1900 01/22/24 1013   01/03/24 1730  cefTRIAXone  (ROCEPHIN ) 1 g in sodium chloride  0.9 % 100 mL IVPB        1 g 200 mL/hr over 30 Minutes Intravenous Every 24 hours 01/03/24 1235 01/07/24 0710   01/02/24 1830  ceFEPIme  (MAXIPIME ) 2 g in sodium chloride  0.9 % 100 mL IVPB  Status:  Discontinued        2 g 200 mL/hr over 30 Minutes Intravenous Every 8 hours 01/02/24 1732 01/03/24 1235   01/02/24 1830  Vancomycin  (VANCOCIN ) 1,250 mg in sodium chloride  0.9 % 250 mL IVPB  Status:  Discontinued        1,250 mg 166.7 mL/hr over 90 Minutes Intravenous Every 24 hours 01/02/24 1732 01/04/24 1754   01/01/24 1200  cefTRIAXone  (ROCEPHIN ) 1 g in sodium chloride  0.9 % 100 mL IVPB  Status:  Discontinued        1 g 200 mL/hr over 30 Minutes Intravenous Every 24 hours 01/01/24 1057 01/02/24 1713   11/29/23 2200  linezolid  (ZYVOX ) tablet 600 mg        600 mg Per Tube Every 12 hours 11/29/23 1034 12/04/23 2215   11/28/23 1000  linezolid  (ZYVOX ) IVPB 600 mg  Status:  Discontinued        600 mg 300 mL/hr over 60 Minutes Intravenous Every 12 hours 11/28/23 0831 11/29/23 1034   11/23/23 1200  vancomycin  (VANCOREADY) IVPB 1250 mg/250 mL  Status:  Discontinued        1,250 mg 166.7 mL/hr over 90 Minutes Intravenous Every 24 hours 11/23/23 1036 11/28/23 0831   11/20/23 1100  vancomycin  (VANCOREADY) IVPB 1250 mg/250 mL  Status:  Discontinued        1,250  mg 166.7 mL/hr over 90 Minutes Intravenous Every 24 hours 11/20/23 0923 11/20/23 0923   11/20/23 1100  vancomycin  (VANCOREADY) IVPB 1250 mg/250 mL  Status:  Discontinued        1,250 mg 166.7 mL/hr over 90 Minutes Intravenous Every 24 hours 11/20/23 0923 11/23/23 1036   11/15/23 2300  vancomycin  (VANCOREADY) IVPB 1250 mg/250 mL        1,250 mg 166.7 mL/hr over 90 Minutes Intravenous Every 24 hours 11/15/23 2215 11/19/23 0924   11/12/23 2200  vancomycin  (VANCOREADY) IVPB 750 mg/150 mL  Status:  Discontinued        750 mg 150 mL/hr over 60 Minutes Intravenous Every 12 hours 11/12/23 0855 11/15/23 2215   11/12/23 0930  vancomycin  (VANCOREADY) IVPB 2000 mg/400 mL        2,000 mg 200 mL/hr over 120 Minutes Intravenous  Once 11/12/23 0838 11/12/23 1116   11/11/23 1345  Ampicillin -Sulbactam (UNASYN ) 3 g in sodium chloride  0.9 % 100 mL IVPB  Status:  Discontinued        3 g 200 mL/hr over 30 Minutes Intravenous Every 6 hours 11/11/23 1257 11/12/23 0836   10/28/23 0400  Ampicillin -Sulbactam (UNASYN ) 3 g in sodium chloride  0.9 % 100 mL IVPB  Status:  Discontinued        3 g 200 mL/hr over 30 Minutes Intravenous Every 6 hours 10/27/23 2151 11/02/23 0945   10/27/23 1845  vancomycin  (VANCOCIN ) IVPB 1000 mg/200 mL premix  Status:  Discontinued        1,000 mg 200 mL/hr over 60 Minutes Intravenous  Once 10/27/23 1843 10/27/23 1843   10/27/23 1845  ceFEPIme  (MAXIPIME ) 2 g in sodium chloride  0.9 % 100 mL IVPB        2 g 200 mL/hr over 30 Minutes Intravenous  Once 10/27/23 1843 10/27/23 2024   10/27/23 1845  vancomycin  (VANCOREADY) IVPB 2000 mg/400 mL        2,000 mg 200 mL/hr over 120 Minutes Intravenous  Once 10/27/23 1843 10/27/23 2123        I have personally reviewed the following labs and images: CBC: Recent Labs  Lab 05/12/24 1258 05/17/24 0935  WBC 10.9* 11.6*  NEUTROABS  --  9.4*  HGB 10.7* 10.3*  HCT 36.8* 34.9*  MCV 92.5 91.6  PLT 364 368   BMP &GFR Recent Labs  Lab  05/12/24 1258  NA 143  K 3.6  CL 100  CO2 32  GLUCOSE 124*  BUN 23*  CREATININE 0.44*  CALCIUM 9.2   Estimated Creatinine Clearance: 126.9 mL/min (A) (by C-G formula based on SCr of 0.44 mg/dL (L)). Liver & Pancreas: Recent Labs  Lab 05/12/24 1258  AST 50*  ALT 115*  ALKPHOS 65  BILITOT 0.2  PROT 7.9  ALBUMIN  3.0*   No results for input(s): LIPASE, AMYLASE in the last 168 hours. No results for input(s): AMMONIA in the last 168 hours. Diabetic: No results for input(s): HGBA1C in the last 72 hours. Recent Labs  Lab 05/18/24 1121 05/18/24 1535 05/19/24 0006 05/19/24 0529 05/19/24 1142  GLUCAP 106* 130* 146* 101* 101*   Cardiac Enzymes: Recent Labs  Lab 05/12/24 1258  CKTOTAL 45*   No results for input(s): PROBNP in the last 8760 hours. Coagulation Profile: No results for input(s): INR, PROTIME in the last 168 hours. Thyroid Function Tests: No results for input(s): TSH, T4TOTAL, FREET4, T3FREE,  THYROIDAB in the last 72 hours. Lipid Profile: No results for input(s): CHOL, HDL, LDLCALC, TRIG, CHOLHDL, LDLDIRECT in the last 72 hours. Anemia Panel: No results for input(s): VITAMINB12, FOLATE, FERRITIN, TIBC, IRON, RETICCTPCT in the last 72 hours. Urine analysis:    Component Value Date/Time   COLORURINE YELLOW 11/07/2023 0930   APPEARANCEUR CLEAR 11/07/2023 0930   LABSPEC 1.017 11/07/2023 0930   PHURINE 5.0 11/07/2023 0930   GLUCOSEU NEGATIVE 11/07/2023 0930   HGBUR NEGATIVE 11/07/2023 0930   BILIRUBINUR NEGATIVE 11/07/2023 0930   KETONESUR NEGATIVE 11/07/2023 0930   PROTEINUR NEGATIVE 11/07/2023 0930   NITRITE NEGATIVE 11/07/2023 0930   LEUKOCYTESUR NEGATIVE 11/07/2023 0930   Sepsis Labs: Invalid input(s): PROCALCITONIN, LACTICIDVEN  Microbiology: Recent Results (from the past 240 hours)  Culture, Respiratory w Gram Stain     Status: None   Collection Time: 05/10/24  2:56 PM   Specimen: Tracheal  Aspirate; Respiratory  Result Value Ref Range Status   Specimen Description TRACHEAL ASPIRATE  Final   Special Requests NONE  Final   Gram Stain   Final    RARE WBC PRESENT, PREDOMINANTLY PMN MODERATE GRAM POSITIVE COCCI MODERATE GRAM NEGATIVE RODS FEW GRAM POSITIVE RODS Performed at [facility], [address], [place], [name] [address]    Culture ABUNDANT PSEUDOMONAS AERUGINOSA  Final   Report Status 05/12/2024 FINAL  Final   Organism ID, Bacteria PSEUDOMONAS AERUGINOSA  Final      Susceptibility   Pseudomonas aeruginosa - MIC*    MEROPENEM 1 SENSITIVE Sensitive     CIPROFLOXACIN  0.25 SENSITIVE Sensitive     IMIPENEM 2 SENSITIVE Sensitive     PIP/TAZO Value in next row Sensitive      <=4 SENSITIVEThis is a modified FDA-approved test that has been validated and its performance characteristics determined by the reporting laboratory.  This laboratory is certified under the Clinical Laboratory Improvement Amendments CLIA as qualified to perform high complexity clinical laboratory testing.    CEFEPIME  Value in next row Sensitive      <=4 SENSITIVEThis is a modified FDA-approved test that has been validated and its performance characteristics determined by the reporting laboratory.  This laboratory is certified under the Clinical Laboratory Improvement Amendments CLIA as qualified to perform high complexity clinical laboratory testing.    CEFTAZIDIME/AVIBACTAM Value in next row Sensitive      <=4 SENSITIVEThis is a modified FDA-approved test that has been validated and its performance characteristics determined by the reporting laboratory.  This laboratory is certified under the Clinical Laboratory Improvement Amendments CLIA as qualified to perform high complexity clinical laboratory testing.    CEFTOLOZANE/TAZOBACTAM Value in next row Sensitive      <=4 SENSITIVEThis is a modified FDA-approved test that has been validated and its performance characteristics determined by the reporting  laboratory.  This laboratory is certified under the Clinical Laboratory Improvement Amendments CLIA as qualified to perform high complexity clinical laboratory testing.    TOBRAMYCIN Value in next row Sensitive      <=4 SENSITIVEThis is a modified FDA-approved test that has been validated and its performance characteristics determined by the reporting laboratory.  This laboratory is certified under the Clinical Laboratory Improvement Amendments CLIA as qualified to perform high complexity clinical laboratory testing.    CEFTAZIDIME Value in next row Sensitive      <=4 SENSITIVEThis is a modified FDA-approved test that has been validated and its performance characteristics determined by the reporting laboratory.  This laboratory is certified under the Clinical Laboratory  Improvement Amendments CLIA as qualified to perform high complexity clinical laboratory testing.    * ABUNDANT PSEUDOMONAS AERUGINOSA  Culture, blood (Routine X 2) w Reflex to ID Panel     Status: None   Collection Time: 05/10/24  4:47 PM   Specimen: BLOOD RIGHT HAND  Result Value Ref Range Status   Specimen Description BLOOD RIGHT HAND  Final   Special Requests   Final    BOTTLES DRAWN AEROBIC AND ANAEROBIC Blood Culture adequate volume   Culture   Final    NO GROWTH 5 DAYS Performed at [facility], [address], [place], [name] [address]    Report Status 05/15/2024 FINAL  Final  Culture, blood (Routine X 2) w Reflex to ID Panel     Status: None   Collection Time: 05/10/24  4:52 PM   Specimen: BLOOD LEFT ARM  Result Value Ref Range Status   Specimen Description BLOOD LEFT ARM  Final   Special Requests   Final    BOTTLES DRAWN AEROBIC AND ANAEROBIC Blood Culture results may not be optimal due to an inadequate volume of blood received in culture bottles   Culture   Final    NO GROWTH 5 DAYS Performed at [facility], [address], [place], [name] [address]    Report Status 05/15/2024 FINAL  Final     Radiology Studies: No results found.   Alm Apo, MD [facility] 05/19/2024, 12:33 PM  If 7PM-7AM, please contact night-coverage [email or web address] 05/19/2024, 12:33 PM

## 2024-05-19 NOTE — Plan of Care (Signed)
  Problem: Fluid Volume: Goal: Ability to maintain a balanced intake and output will improve Outcome: Progressing   Problem: Skin Integrity: Goal: Risk for impaired skin integrity will decrease Outcome: Progressing   Problem: Tissue Perfusion: Goal: Adequacy of tissue perfusion will improve Outcome: Progressing   Problem: Clinical Measurements: Goal: Ability to maintain clinical measurements within normal limits will improve Outcome: Progressing   Problem: Clinical Measurements: Goal: Will remain free from infection Outcome: Progressing   Problem: Clinical Measurements: Goal: Diagnostic test results will improve Outcome: Progressing   Problem: Clinical Measurements: Goal: Respiratory complications will improve Outcome: Progressing   Problem: Safety: Goal: Ability to remain free from injury will improve Outcome: Progressing   Problem: Skin Integrity: Goal: Risk for impaired skin integrity will decrease Outcome: Progressing   Plan of care, monitoring, assessment, treatment, and intervention (s) ongoing, see MAR see flowheet

## 2024-05-19 NOTE — Progress Notes (Signed)
 NAME:  Garrett [name], MRN:  [id number], DOB:  03/04/1976, LOS: 205 ADMISSION DATE:  10/27/2023, CONSULTATION DATE:  10/27/2023 REFERRING MD: Regalado - TRH, CHIEF COMPLAINT:  Found down   History of Present Illness:  48 y/o man who was found down at home. PMHx significant for HTN.  Patient was down for an unknown amount of time, found by his fiance when she came home from work, reportedly with agonal breathing. Patient had driven her to work the morning of admission.  He has HTN but never checks his BP and does not follow with MD. Patient's fiance states that she can tell when his BP is really high; his eyes get blood shot and he is sweating. No h/o seizures. Besides almost daily marijuana and cigarette smoking, she is not aware of any other drug use. Drug screen positive for opioids and he was given several doses of Narcan . 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 BP in ED 262/156. CXR revealed RUL and perihilar infiltrates suggesting aspiration pneumonia. ED labs also revealed PH 7.169, LA 4.4, CPK 985. Patient was intubated in ED.  PCCM consulted.  Pertinent Medical History:  Hypertension  Significant Hospital Events: Including procedures, antibiotic start and stop dates in addition to other pertinent events   3/08 - Intubated in ED, pontine bleed/SAH. CT head 17:09 Acute large hemorrhage 4.1 cm in pons with intraventricular extension without hydrocephalus 3/09 - CTA 03:14 AM No change in IPH in pons, similar biparietal [facility] 3/14 - Now awake, appears locked in can follow commands with eyes. 3/16 - Purposeful with left upper extremity  3/22 - Tracheostomy placed at bedside, bleeding issues overnight from trach site 3/24 - PEG (Dr. Sebastian) 3/24 - Sputum culture with MRSA 3/27  - Vomited. TF on hold. Abd film c/w ileus. Added reglan , got SSE. Did tolerate PSV all day  3/28 - BMs x2 after SSE day prior. Added back TFs at 1/2 rated. Tolerated PS 3/29 - Tolerated PS  3/31 - Tolerating CPAP PS 15/5, TF on hold due to ileus. 4/01 - Con't to hold tube feeds today, restarting vancomycin  and sending tracheal aspirate as peaks are 37, plat 24, driving 19. Thick secretions. Fever overnight.  4/02 - Peaks improved. Ongoing hiccups. Trickle feeding. Neuro exam unchanged.  4/20 - Trach was changed to cuffless #8 4/28 - Not safe to swallow. Limited communication and he follows simple commands. On TF 5/01 - On TC 28%, with large secretions. Working with speech and he is improving to initiate PMV trials under ST supervision   5/05 - On TC 30%, 7 L/min. Trach #6 cuffless, changed 5/1 to facilitate PMV trials  5/12 - On TC 21%, 6 L/min. Trach #6 cuffless, unable to produce sounds or speak on PMV. No significant resp secretions. On PEG TF 6/10 - Tracheostomy was removed due to failure to well provide adequate airway 02/15/2024 pulmonary critical care consult. 7/07 Patient obtunded and desaturating on NRB; transfer to icu for intubation; updated mother over phone who wants to reverse code status to full code; ent consulted 7/09 Remains intubated on no continuous sedation, right eye open but does not track movement  7/16 Revision ENT trach with [facility] Flap 7/18 Weaned on vent 5 hours  7/19 Tolerated trach collar for the majority of the day but had some increased work of breath  resulting in being placed back on vent support  7/20 No issues overnight, back on ATC this AM 7/21 Did not tolerate ATC. Placed back on PRVC. 7/24 ATC until afternoon and went on vent for WOB reportedly  7/25 ATC for a few hours  7/26 ATC x 6h 7/28 tolerating trach collar-tolerated for most of the day 7/30 on vent overnight and back on trach collar today 8/1 back on vent overnight  8/2 was able to maintain off of  ventilator overnight but by 6 AM today patient was seen with signs of increased work of breathing including tachycardia therefore patient was placed back on ventilator 8/3 remained on ventilator overnight to transition to trach collar this a.m. 8/4 did 18 hour ATC yesterday; back on vent 3am today 8/5 TC for 12-14 hours. Then went back to vent because of tachypnea.  8/6 tolerated ATC at 30% for 24 hours. Back on vent this AM for rest as was a bit tachypneic 8/11 been off vent >24 hours. 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  8/12-off vent for over 48 hours 8/18 completed abx  8/19 Changed to cuffless trach 8/20 Increasing tachypnea, hypercarbia.  Trach changed back to #6 cuffed and brought back to ICU to be placed on ventilator.  Antibiotics broadened to linezolid  and cefepime  8/23 PSV wean 8/24 eeg for twitching no sz 8/26 started TC 8/27 did TC overnight, hypercarbic on repeat gas but compensated 9/2 periods of apnea but overall doing ok ATC day vent night 9/8 continuing noct vent Atc day  9/22 Tolerating ATC during day and nocturnal ventilation  Interim History / Subjective:  Tolerating trach collar S/p trach change on 05/08/24. No secretions per BSRN Afebrile  Objective:   Blood pressure 109/78, pulse 61, temperature (!) 96.8 F (36 C), temperature source Axillary, resp. rate 16, height 5' 8 (1.727 m), weight 94 kg, SpO2 100%.    Vent Mode: PRVC FiO2 (%):  [35 %-40 %] 35 % Set Rate:  [12 bmp] 12 bmp Vt Set:  [550 mL] 550 mL PEEP:  [5 cmH20] 5 cmH20 Plateau Pressure:  [19 cmH20-23 cmH20] 19 cmH20   Intake/Output Summary (Last 24 hours) at 05/19/2024 9177 Last data filed at 05/19/2024 0600 Gross per 24 hour  Intake 1785 ml  Output 1100 ml  Net 685 ml   Filed Weights   05/18/24 0000 05/18/24 0428 05/19/24 0500  Weight: 96 kg 96 kg 94 kg   Physical Exam: General: Chronically ill-appearing  HENT: Tape over left  eye Neck: Cuffed trach in place Eyes: EOMI, no scleral icterus Respiratory: Clear breath sounds  cardiovascular: S1-S2 appreciated GI: Bowel sounds appreciated Extremities:-No edema, no clubbing Neuro: Eyes open, upward tracking, does not follow commands, chronic right facial twitching GU: Foley in place  I reviewed last 24 h vitals and pain scores, last 48 h intake and output, last 24 h labs and trends, and last 24 h imaging results. Resolved Problem List:  Post sedation hypotension Left eye conjunctivitis - resolved  Hyperglycemia, resolved Pseudomonas and E. 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 Not a candidate for decannulation given ineffective airway clearance -course since then has been c/b recurrent HCAP, variable vent need-- at times having decompensations req 24hr vent.  -most recently he has had noct hypercarbia and its felt that he needs noct vent. He continues to demonstrate hypercarbia after prolonged ATC trials  Plan:  Not a candidate for decannulation Continue trach collar during the day as tolerated Mandatory ventilator support at night Routine trach care Neb treatments as needed VAP bundle in place  Will follow again in a week, call as needed  Best Practice (right click and Reselect all SmartList Selections daily)   Per primary  We will cont to follow    Jennet Epley, MD  PCCM Pager: See Tracey

## 2024-05-20 DIAGNOSIS — Z789 Other specified health status: Secondary | ICD-10-CM | POA: Diagnosis not present

## 2024-05-20 DIAGNOSIS — I619 Nontraumatic intracerebral hemorrhage, unspecified: Secondary | ICD-10-CM | POA: Diagnosis not present

## 2024-05-20 DIAGNOSIS — Z7189 Other specified counseling: Secondary | ICD-10-CM | POA: Diagnosis not present

## 2024-05-20 LAB — GLUCOSE, CAPILLARY
Glucose-Capillary: 106 mg/dL — ABNORMAL HIGH (ref 70–99)
Glucose-Capillary: 140 mg/dL — ABNORMAL HIGH (ref 70–99)
Glucose-Capillary: 107 mg/dL — ABNORMAL HIGH (ref 70–99)
Glucose-Capillary: 141 mg/dL — ABNORMAL HIGH (ref 70–99)

## 2024-05-20 NOTE — TOC Progression Note (Signed)
 Transition of Care [facility]) - Progression Note    Patient Details  Name: Garrett [name] MRN: [id number] Date of Birth: 06-Aug-1976  Transition of Care [facility]) CM/SW Contact  Lendia Dais, [place] Phone Number: 05/20/2024, 9:28 AM  Clinical Narrative:  Barriers still remain. Disability is pending through social security, [facility] leadership aware.  TOC will continue to monitor.    Expected Discharge Plan: Long Term Nursing Home Barriers to Discharge: Continued Medical Work up, SNF Pending Medicaid, SNF Pending bed offer, SNF Pending payor source - [facility], Inadequate or no insurance (New trach)               Expected Discharge Plan and Services In-house Referral: Clinical Social Work, Hospice / Palliative Care   Post Acute Care Choice: Skilled Nursing Facility Living arrangements for the past 2 months: Single Family Home                                       Social Drivers of Health (SDOH) Interventions SDOH Screenings   Food Insecurity: Patient Unable To Answer (10/29/2023)  Social Connections: Unknown (06/23/2023)   Received from [facility]  Tobacco Use: High Risk (10/31/2023)    Readmission Risk Interventions     No data to display

## 2024-05-20 NOTE — Progress Notes (Signed)
 PROGRESS NOTE  Garrett [name] FMW:[id number] DOB: 1976/06/29   PCP: Pcp, No  Patient is from: Home  DOA: 10/27/2023 LOS: 206  Chief complaints Chief Complaint  Patient presents with   Unresponsive     Brief Narrative / Interim history: 48 y/o male with past medical history of hypertension was found down at home agonal breathing. History of uncontrolled hypertension but never had been to the doctor. Patient was noted to have a large (4.1 cm) hemorrhage centered in the pons with intraventricular extension into the fourth ventricle and also extension into the basal cisterns and trace additional subarachnoid hemorrhage along the left parietal convexity. BP in ED was 262/156. CXR revealed RUL and perihilar infiltrates suggesting aspiration pneumonia. ED labs also revealed PH 7.169, LA 4.4, CPK 985. Patient was intubated in ED and was admitted hospital.    Patient had prolonged complicated hospital stay pontine hemorrhage, biparietal SAH.  Patient's mental status poor, s/p tracheostomy and PEG placement.  Sputum cultures positive for MRSA on contact precautions.  Patient had prolonged difficulty weaning from vent.  Patient was on trach collar for few days but later required ventilatory support mostly at nights and is currently in ICU.  Subjective: No events overnight. Awake in bed, right eye open, no tracking. Left eye red still. Otherwise no changes.    Objective: Vitals:   05/20/24 1121 05/20/24 1146 05/20/24 1200 05/20/24 1300  BP:   123/84 (!) 130/90  Pulse:  73 69 74  Resp:  (!) 24 20 (!) 23  Temp: 98.3 F (36.8 C)     TempSrc: Axillary     SpO2:  96% 96% 95%  Weight:      Height:        Examination: Constitutional:      Comments: Laying in bed in no distress.  Right eye open and wandering but no tracking or blink reflex HENT:     Head:     Mouth/Throat:     Mouth: Mucous membranes are dry.  Eyes:     Comments: Intermittent right eye movement at times with no  consistent tracking or blink reflex. Left eye opaque (injected but no further discharge) Cardiovascular:     Rate and Rhythm: Normal rate and regular rhythm.  Pulmonary:     Comments: regular respi pattern Abdominal:     General: There is no distension.     Palpations: Abdomen is soft.  Musculoskeletal:        General: Normal range of motion. Skin:    General: Skin is warm and dry.  Neurological:     Comments: Does not interact or follow commands.  Occasionally opens R eye with no meaningful purpose that can not be repeated consistently. 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  Not a candidate for decannulation given ineffective airway clearance, -Continue trach collar during day with vent support at night. On and off tachypnea persists. -Continue trach care per protocol. -Pulmonary follow up appreciated.    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 Waylan evaluated and placed tarsorraphy.   -8/29-ophtho rec: Erythromycin  eyedrop q2h, tape  eyes with paper tape if not blinking. -9/14, ophtho, Dr. Leane reengaged due to worsening conjunctivitis and clouding I recommended second antibiotic, gatifloxacin  4 times daily.per Optho, no epithelial defects noted. - 9/25: courses completed of abx drops; continue on opth ointment and cover left eye with patch and/or tape to keep lid closed  - 9/29, more injected with discharge again in OS, resume 5 day course of Polytrim  eyedrops to left eye   Pseudomonal  colonization -Trach culture aspirate obtained on 05/10/2024 due to SIRS criteria; fever defervesced without antibiotics and no increase in secretions or decline clinically  -Continue to evaluate any recurrent SIRS criteria in clinical context  Hypernatremia: Resolved. - Continue free water  at 150 cc every 6 hours. - Intermittently monitor labs  Elevated liver enzymes: Mild.  Unclear etiology.  CK normal. - Check intermittently.  Inadequate oral intake - PEG replaced by IR on 05/15/24 after becoming dislodged again Body mass index is 32.18 kg/m. Nutrition Problem: Inadequate oral intake Etiology: inability to eat Signs/Symptoms: NPO status Interventions: Tube feeding, Prostat, MVI, Juven   DVT prophylaxis:  enoxaparin  (LOVENOX ) injection 40 mg Start: 03/06/24 1000 SCDs Start: 10/27/23 2226  Code Status: Full code Family Communication: None at bedside Level of care: ICU Status is: Inpatient Remains inpatient appropriate because: Intracranial hemorrhage, vegetative state   Final disposition: Unclear   55 minutes with more than 50% spent in reviewing records, counseling patient/family and coordinating care.   Sch Meds:  Scheduled Meds:  amLODipine   10 mg Per Tube Daily   artificial tears   Both Eyes BID   Chlorhexidine  Gluconate Cloth  6 each Topical Daily   enoxaparin  (LOVENOX ) injection  40 mg Subcutaneous Daily   famotidine   20 mg Per Tube BID   feeding supplement (JEVITY 1.5 CAL/FIBER)  237 mL Per Tube 5 X Daily   feeding supplement (PROSource TF20)  60 mL Per Tube TID   free water   150 mL Per Tube Q6H   insulin  aspart  0-9 Units Subcutaneous Q6H   metoprolol  tartrate  75 mg Per Tube BID   multivitamin with minerals  1 tablet Per Tube Daily   mouth rinse  15 mL Mouth Rinse Q2H   polyethylene glycol  17 g Per Tube Daily   trimethoprim -polymyxin b   2 drop Left Eye QID   Continuous Infusions: PRN Meds:.acetaminophen  **OR** acetaminophen  (TYLENOL ) oral liquid 160 mg/5  mL **OR** acetaminophen , bisacodyl , Gerhardt's butt cream, hydrALAZINE , ipratropium-albuterol , mouth rinse, traZODone   Antimicrobials: Anti-infectives (From admission, onward)    Start     Dose/Rate Route Frequency Ordered Stop   04/09/24 2200  linezolid  (ZYVOX ) tablet 600 mg  Status:  Discontinued        600 mg Per Tube Every 12 hours 04/09/24 1132 04/13/24 1332   04/09/24 1700  vancomycin  (VANCOREADY) IVPB 1500 mg/300 mL  Status:  Discontinued        1,500 mg 150 mL/hr over 120 Minutes Intravenous Every 12 hours 04/09/24 1108 04/09/24 1132   04/09/24 1000  linezolid  (ZYVOX ) IVPB 600 mg  Status:  Discontinued        600 mg 300 mL/hr over 60 Minutes Intravenous Every 12 hours 04/09/24 0527 04/09/24 1108   04/01/24 1145  ceFEPIme  (MAXIPIME ) 2 g in sodium chloride  0.9 % 100 mL IVPB        2 g 200 mL/hr over 30 Minutes Intravenous Every 8 hours 04/01/24 1137 04/14/24 2230   02/21/24 1000  vancomycin  (VANCOREADY) IVPB 1250 mg/250 mL  1,250 mg 166.7 mL/hr over 90 Minutes Intravenous Every 12 hours 02/20/24 2158 02/28/24 0130   02/16/24 1445  ceFEPIme  (MAXIPIME ) 2 g in sodium chloride  0.9 % 100 mL IVPB        2 g 200 mL/hr over 30 Minutes Intravenous Every 8 hours 02/16/24 1348 02/27/24 2322   02/16/24 0945  vancomycin  (VANCOREADY) IVPB 1500 mg/300 mL  Status:  Discontinued        1,500 mg 150 mL/hr over 120 Minutes Intravenous Every 12 hours 02/16/24 0848 02/20/24 2158   02/15/24 1215  Ampicillin -Sulbactam (UNASYN ) 3 g in sodium chloride  0.9 % 100 mL IVPB  Status:  Discontinued        3 g 200 mL/hr over 30 Minutes Intravenous Every 6 hours 02/15/24 1125 02/16/24 1226   01/24/24 2000  vancomycin  (VANCOREADY) IVPB 1250 mg/250 mL        1,250 mg 166.7 mL/hr over 90 Minutes Intravenous Every 24 hours 01/24/24 1154 01/27/24 0125   01/22/24 2200  Vancomycin  (VANCOCIN ) 1,250 mg in sodium chloride  0.9 % 250 mL IVPB  Status:  Discontinued        1,250 mg 166.7 mL/hr over 90 Minutes  Intravenous Every 24 hours 01/22/24 1013 01/22/24 1020   01/22/24 2000  vancomycin  (VANCOREADY) IVPB 1250 mg/250 mL  Status:  Discontinued        1,250 mg 166.7 mL/hr over 90 Minutes Intravenous Every 24 hours 01/22/24 1020 01/24/24 1154   01/20/24 2000  ceFEPIme  (MAXIPIME ) 2 g in sodium chloride  0.9 % 100 mL IVPB  Status:  Discontinued        2 g 200 mL/hr over 30 Minutes Intravenous Every 8 hours 01/20/24 1900 01/24/24 1154   01/20/24 2000  Vancomycin  (VANCOCIN ) 1,250 mg in sodium chloride  0.9 % 250 mL IVPB  Status:  Discontinued        1,250 mg 166.7 mL/hr over 90 Minutes Intravenous Every 24 hours 01/20/24 1900 01/22/24 1013   01/03/24 1730  cefTRIAXone  (ROCEPHIN ) 1 g in sodium chloride  0.9 % 100 mL IVPB        1 g 200 mL/hr over 30 Minutes Intravenous Every 24 hours 01/03/24 1235 01/07/24 0710   01/02/24 1830  ceFEPIme  (MAXIPIME ) 2 g in sodium chloride  0.9 % 100 mL IVPB  Status:  Discontinued        2 g 200 mL/hr over 30 Minutes Intravenous Every 8 hours 01/02/24 1732 01/03/24 1235   01/02/24 1830  Vancomycin  (VANCOCIN ) 1,250 mg in sodium chloride  0.9 % 250 mL IVPB  Status:  Discontinued        1,250 mg 166.7 mL/hr over 90 Minutes Intravenous Every 24 hours 01/02/24 1732 01/04/24 1754   01/01/24 1200  cefTRIAXone  (ROCEPHIN ) 1 g in sodium chloride  0.9 % 100 mL IVPB  Status:  Discontinued        1 g 200 mL/hr over 30 Minutes Intravenous Every 24 hours 01/01/24 1057 01/02/24 1713   11/29/23 2200  linezolid  (ZYVOX ) tablet 600 mg        600 mg Per Tube Every 12 hours 11/29/23 1034 12/04/23 2215   11/28/23 1000  linezolid  (ZYVOX ) IVPB 600 mg  Status:  Discontinued        600 mg 300 mL/hr over 60 Minutes Intravenous Every 12 hours 11/28/23 0831 11/29/23 1034   11/23/23 1200  vancomycin  (VANCOREADY) IVPB 1250 mg/250 mL  Status:  Discontinued        1,250 mg 166.7 mL/hr over 90 Minutes Intravenous Every 24 hours 11/23/23  1036 11/28/23 0831   11/20/23 1100  vancomycin  (VANCOREADY) IVPB 1250  mg/250 mL  Status:  Discontinued        1,250 mg 166.7 mL/hr over 90 Minutes Intravenous Every 24 hours 11/20/23 0923 11/20/23 0923   11/20/23 1100  vancomycin  (VANCOREADY) IVPB 1250 mg/250 mL  Status:  Discontinued        1,250 mg 166.7 mL/hr over 90 Minutes Intravenous Every 24 hours 11/20/23 0923 11/23/23 1036   11/15/23 2300  vancomycin  (VANCOREADY) IVPB 1250 mg/250 mL        1,250 mg 166.7 mL/hr over 90 Minutes Intravenous Every 24 hours 11/15/23 2215 11/19/23 0924   11/12/23 2200  vancomycin  (VANCOREADY) IVPB 750 mg/150 mL  Status:  Discontinued        750 mg 150 mL/hr over 60 Minutes Intravenous Every 12 hours 11/12/23 0855 11/15/23 2215   11/12/23 0930  vancomycin  (VANCOREADY) IVPB 2000 mg/400 mL        2,000 mg 200 mL/hr over 120 Minutes Intravenous  Once 11/12/23 0838 11/12/23 1116   11/11/23 1345  Ampicillin -Sulbactam (UNASYN ) 3 g in sodium chloride  0.9 % 100 mL IVPB  Status:  Discontinued        3 g 200 mL/hr over 30 Minutes Intravenous Every 6 hours 11/11/23 1257 11/12/23 0836   10/28/23 0400  Ampicillin -Sulbactam (UNASYN ) 3 g in sodium chloride  0.9 % 100 mL IVPB  Status:  Discontinued        3 g 200 mL/hr over 30 Minutes Intravenous Every 6 hours 10/27/23 2151 11/02/23 0945   10/27/23 1845  vancomycin  (VANCOCIN ) IVPB 1000 mg/200 mL premix  Status:  Discontinued        1,000 mg 200 mL/hr over 60 Minutes Intravenous  Once 10/27/23 1843 10/27/23 1843   10/27/23 1845  ceFEPIme  (MAXIPIME ) 2 g in sodium chloride  0.9 % 100 mL IVPB        2 g 200 mL/hr over 30 Minutes Intravenous  Once 10/27/23 1843 10/27/23 2024   10/27/23 1845  vancomycin  (VANCOREADY) IVPB 2000 mg/400 mL        2,000 mg 200 mL/hr over 120 Minutes Intravenous  Once 10/27/23 1843 10/27/23 2123        I have personally reviewed the following labs and images: CBC: Recent Labs  Lab 05/17/24 0935  WBC 11.6*  NEUTROABS 9.4*  HGB 10.3*  HCT 34.9*  MCV 91.6  PLT 368   BMP &GFR No results for input(s):  NA, K, CL, CO2, GLUCOSE, BUN, CREATININE, CALCIUM, MG, PHOS in the last 168 hours.  Invalid input(s): GFRCG  Estimated Creatinine Clearance: 128.2 mL/min (A) (by C-G formula based on SCr of 0.44 mg/dL (L)). Liver & Pancreas: No results for input(s): AST, ALT, ALKPHOS, BILITOT, PROT, ALBUMIN  in the last 168 hours.  No results for input(s): LIPASE, AMYLASE in the last 168 hours. No results for input(s): AMMONIA in the last 168 hours. Diabetic: No results for input(s): HGBA1C in the last 72 hours. Recent Labs  Lab 05/19/24 1142 05/19/24 1524 05/19/24 2355 05/20/24 0610 05/20/24 1119  GLUCAP 101* 151* 107* 106* 140*   Cardiac Enzymes: No results for input(s): CKTOTAL, CKMB, CKMBINDEX, TROPONINI in the last 168 hours.  No results for input(s): PROBNP in the last 8760 hours. Coagulation Profile: No results for input(s): INR, PROTIME in the last 168 hours. Thyroid Function Tests: No results for input(s): TSH, T4TOTAL, FREET4, T3FREE, THYROIDAB in the last 72 hours. Lipid Profile: No results for input(s): CHOL, HDL, LDLCALC, TRIG, CHOLHDL,  LDLDIRECT in the last 72 hours. Anemia Panel: No results for input(s): VITAMINB12, FOLATE, FERRITIN, TIBC, IRON, RETICCTPCT in the last 72 hours. Urine analysis:    Component Value Date/Time   COLORURINE YELLOW 11/07/2023 0930   APPEARANCEUR CLEAR 11/07/2023 0930   LABSPEC 1.017 11/07/2023 0930   PHURINE 5.0 11/07/2023 0930   GLUCOSEU NEGATIVE 11/07/2023 0930   HGBUR NEGATIVE 11/07/2023 0930   BILIRUBINUR NEGATIVE 11/07/2023 0930   KETONESUR NEGATIVE 11/07/2023 0930   PROTEINUR NEGATIVE 11/07/2023 0930   NITRITE NEGATIVE 11/07/2023 0930   LEUKOCYTESUR NEGATIVE 11/07/2023 0930   Sepsis Labs: Invalid input(s): PROCALCITONIN, LACTICIDVEN  Microbiology: Recent Results (from the past 240 hours)  Culture, Respiratory w Gram Stain     Status: None    Collection Time: 05/10/24  2:56 PM   Specimen: Tracheal Aspirate; Respiratory  Result Value Ref Range Status   Specimen Description TRACHEAL ASPIRATE  Final   Special Requests NONE  Final   Gram Stain   Final    RARE WBC PRESENT, PREDOMINANTLY PMN MODERATE GRAM POSITIVE COCCI MODERATE GRAM NEGATIVE RODS FEW GRAM POSITIVE RODS Performed at [facility], [address] [place], [name] [address]    Culture ABUNDANT PSEUDOMONAS AERUGINOSA  Final   Report Status 05/12/2024 FINAL  Final   Organism ID, Bacteria PSEUDOMONAS AERUGINOSA  Final      Susceptibility   Pseudomonas aeruginosa - MIC*    MEROPENEM 1 SENSITIVE Sensitive     CIPROFLOXACIN  0.25 SENSITIVE Sensitive     IMIPENEM 2 SENSITIVE Sensitive     PIP/TAZO Value in next row Sensitive      <=4 SENSITIVEThis is a modified FDA-approved test that has been validated and its performance characteristics determined by the reporting laboratory.  This laboratory is certified under the Clinical Laboratory Improvement Amendments CLIA as qualified to perform high complexity clinical laboratory testing.    CEFEPIME  Value in next row Sensitive      <=4 SENSITIVEThis is a modified FDA-approved test that has been validated and its performance characteristics determined by the reporting laboratory.  This laboratory is certified under the Clinical Laboratory Improvement Amendments CLIA as qualified to perform high complexity clinical laboratory testing.    CEFTAZIDIME/AVIBACTAM Value in next row Sensitive      <=4 SENSITIVEThis is a modified FDA-approved test that has been validated and its performance characteristics determined by the reporting laboratory.  This laboratory is certified under the Clinical Laboratory Improvement Amendments CLIA as qualified to perform high complexity clinical laboratory testing.    CEFTOLOZANE/TAZOBACTAM Value in next row Sensitive      <=4 SENSITIVEThis is a modified FDA-approved test that has been validated and  its performance characteristics determined by the reporting laboratory.  This laboratory is certified under the Clinical Laboratory Improvement Amendments CLIA as qualified to perform high complexity clinical laboratory testing.    TOBRAMYCIN Value in next row Sensitive      <=4 SENSITIVEThis is a modified FDA-approved test that has been validated and its performance characteristics determined by the reporting laboratory.  This laboratory is certified under the Clinical Laboratory Improvement Amendments CLIA as qualified to perform high complexity clinical laboratory testing.    CEFTAZIDIME Value in next row Sensitive      <=4 SENSITIVEThis is a modified FDA-approved test that has been validated and its performance characteristics determined by the reporting laboratory.  This laboratory is certified under the Clinical Laboratory Improvement Amendments CLIA as qualified to perform high complexity clinical laboratory testing.    * ABUNDANT  PSEUDOMONAS AERUGINOSA  Culture, blood (Routine X 2) w Reflex to ID Panel     Status: None   Collection Time: 05/10/24  4:47 PM   Specimen: BLOOD RIGHT HAND  Result Value Ref Range Status   Specimen Description BLOOD RIGHT HAND  Final   Special Requests   Final    BOTTLES DRAWN AEROBIC AND ANAEROBIC Blood Culture adequate volume   Culture   Final    NO GROWTH 5 DAYS Performed at [facility], [address] [place], [name] [address]    Report Status 05/15/2024 FINAL  Final  Culture, blood (Routine X 2) w Reflex to ID Panel     Status: None   Collection Time: 05/10/24  4:52 PM   Specimen: BLOOD LEFT ARM  Result Value Ref Range Status   Specimen Description BLOOD LEFT ARM  Final   Special Requests   Final    BOTTLES DRAWN AEROBIC AND ANAEROBIC Blood Culture results may not be optimal due to an inadequate volume of blood received in culture bottles   Culture   Final    NO GROWTH 5 DAYS Performed at [facility], [address], [place],  [name] [address]    Report Status 05/15/2024 FINAL  Final    Radiology Studies: No results found.   Alm Apo, MD [facility] 05/20/2024, 1:38 PM  If 7PM-7AM, please contact night-coverage [email or web address] 05/20/2024, 1:38 PM

## 2024-05-21 DIAGNOSIS — I619 Nontraumatic intracerebral hemorrhage, unspecified: Secondary | ICD-10-CM | POA: Diagnosis not present

## 2024-05-21 DIAGNOSIS — Z930 Tracheostomy status: Secondary | ICD-10-CM | POA: Diagnosis not present

## 2024-05-21 DIAGNOSIS — Z789 Other specified health status: Secondary | ICD-10-CM | POA: Diagnosis not present

## 2024-05-21 DIAGNOSIS — J9611 Chronic respiratory failure with hypoxia: Secondary | ICD-10-CM | POA: Diagnosis not present

## 2024-05-21 LAB — GLUCOSE, CAPILLARY
Glucose-Capillary: 103 mg/dL — ABNORMAL HIGH (ref 70–99)
Glucose-Capillary: 160 mg/dL — ABNORMAL HIGH (ref 70–99)
Glucose-Capillary: 151 mg/dL — ABNORMAL HIGH (ref 70–99)
Glucose-Capillary: 136 mg/dL — ABNORMAL HIGH (ref 70–99)

## 2024-05-21 NOTE — TOC Progression Note (Signed)
 Transition of Care [facility]) - Progression Note    Patient Details  Name: Garrett [name] MRN: [id number] Date of Birth: 1975-11-25  Transition of Care [facility] CM/SW Contact  Lendia Dais, [place] Phone Number: 05/21/2024, 2:53 PM  Clinical Narrative:  [facility] requested for a new referral to be sent and to evaluate the pt. CSW sent out referral yesterday and contacted Angie via text message. Pending a response from Angie at [facility].  CSW will continue to follow.   Expected Discharge Plan: Long Term Nursing Home Barriers to Discharge: Continued Medical Work up, SNF Pending Medicaid, SNF Pending bed offer, SNF Pending payor source - LOG, Inadequate or no insurance (New trach)               Expected Discharge Plan and Services In-house Referral: Clinical Social Work, Hospice / Palliative Care   Post Acute Care Choice: Skilled Nursing Facility Living arrangements for the past 2 months: Single Family Home                                       Social Drivers of Health (SDOH) Interventions SDOH Screenings   Food Insecurity: Patient Unable To Answer (10/29/2023)  Social Connections: Unknown (06/23/2023)   Received from [facility]  Tobacco Use: High Risk (10/31/2023)    Readmission Risk Interventions     No data to display

## 2024-05-21 NOTE — Plan of Care (Signed)
  Problem: Nutrition: Goal: Risk of aspiration will decrease Outcome: Progressing   Problem: Tissue Perfusion: Goal: Adequacy of tissue perfusion will improve Outcome: Progressing   Problem: Clinical Measurements: Goal: Respiratory complications will improve Outcome: Progressing Goal: Cardiovascular complication will be avoided Outcome: Progressing   Problem: Nutrition: Goal: Risk of aspiration will decrease Outcome: Progressing   Problem: Education: Goal: Knowledge of disease or condition will improve Outcome: Not Progressing   Problem: Self-Care: Goal: Verbalization of feelings and concerns over difficulty with self-care will improve Outcome: Not Progressing Goal: Ability to communicate needs accurately will improve Outcome: Not Progressing   Problem: Self-Care: Goal: Ability to participate in self-care as condition permits will improve Outcome: Not Progressing

## 2024-05-21 NOTE — Progress Notes (Signed)
 POCT CBG collected @ 0551 not crossing over into Epic.  CBG: 103

## 2024-05-21 NOTE — Progress Notes (Signed)
 [facility]                                                                               Garrett [name], is a 48 y.o. male, DOB - 05-07-1976, FMW:[id number] Admit date - 10/27/2023    Outpatient Primary MD for the patient is Pcp, No  LOS - 207  days    Brief summary   48 y/o male with past medical history of hypertension was found down at home agonal breathing. History of uncontrolled hypertension but never had been to the doctor. Patient was noted to have a large (4.1 cm) hemorrhage centered in the pons with intraventricular extension into the fourth ventricle and also extension into the basal cisterns and trace additional subarachnoid hemorrhage along the left parietal convexity. BP in ED was 262/156. CXR revealed RUL and perihilar infiltrates suggesting aspiration pneumonia. ED labs also revealed PH 7.169, LA 4.4, CPK 985. Patient was intubated in ED and was admitted hospital.    Patient had prolonged complicated hospital stay pontine hemorrhage, biparietal SAH.  Patient's mental status poor, s/p tracheostomy and PEG placement.  Sputum cultures positive for MRSA on contact precautions.  Patient had prolonged difficulty weaning from vent.  Patient was on trach collar for few days but later required ventilatory support mostly at nights and is currently in ICU.     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  Not a candidate for decannulation given ineffective airway clearance, -Continue trach  collar during day with vent support at night. On and off tachypnea persists. -Continue trach care per protocol. -Pulmonary follow up appreciated.    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 Waylan evaluated and placed tarsorraphy.   -8/29-ophtho rec: Erythromycin  eyedrop q2h, tape eyes with paper tape if not blinking. -9/14, ophtho, Dr. Leane reengaged due to worsening conjunctivitis and clouding recommended second antibiotic, gatifloxacin  4 times daily.per Optho, no epithelial defects noted. - 9/25: courses completed of abx drops; continue on opth ointment and cover left eye with patch and/or tape to keep lid closed  - 9/29, more injected with discharge again in OS, resume 5 day course of Polytrim  eyedrops to left eye   Pseudomonal colonization -Trach culture aspirate obtained on 05/10/2024 due to SIRS criteria; fever defervesced without antibiotics and no increase in secretions or decline clinically  -Continue to evaluate any recurrent SIRS criteria in clinical context   Hypernatremia: Resolved. - Continue free water  at 150 cc every 6 hours. - Intermittently monitor labs   Elevated liver enzymes: Mild.  Unclear etiology.  CK normal. - Check intermittently.   Inadequate oral intake - PEG replaced by IR on 05/15/24 after becoming dislodged again Body mass index is 32.18 kg/m. Nutrition Problem: Inadequate oral intake Etiology: inability to eat  Signs/Symptoms: NPO status Interventions: Tube feeding, Prostat, MVI, Juven    RN Pressure Injury Documentation:    Malnutrition Type:  Nutrition Problem: Inadequate oral intake Etiology: inability to eat   Malnutrition Characteristics:  Signs/Symptoms: NPO status   Nutrition Interventions:  Interventions: Tube feeding, Prostat, MVI, Juven  Estimated body mass index is 31.51 kg/m as calculated from the following:   Height as of this encounter: 5' 8 (1.727 m).   Weight as of this encounter: 94 kg.  Code Status:  full code.  DVT Prophylaxis:  enoxaparin  (LOVENOX ) injection 40 mg Start: 03/06/24 1000 SCDs Start: 10/27/23 2226   Level of Care: Level of care: ICU Family Communication:none at bedside.   Disposition Plan:     Remains inpatient appropriate:  pending.   Procedures:    Consultants:   PCCM.   Antimicrobials:   Anti-infectives (From admission, onward)    Start     Dose/Rate Route Frequency Ordered Stop   04/09/24 2200  linezolid  (ZYVOX ) tablet 600 mg  Status:  Discontinued        600 mg Per Tube Every 12 hours 04/09/24 1132 04/13/24 1332   04/09/24 1700  vancomycin  (VANCOREADY) IVPB 1500 mg/300 mL  Status:  Discontinued        1,500 mg 150 mL/hr over 120 Minutes Intravenous Every 12 hours 04/09/24 1108 04/09/24 1132   04/09/24 1000  linezolid  (ZYVOX ) IVPB 600 mg  Status:  Discontinued        600 mg 300 mL/hr over 60 Minutes Intravenous Every 12 hours 04/09/24 0527 04/09/24 1108   04/01/24 1145  ceFEPIme  (MAXIPIME ) 2 g in sodium chloride  0.9 % 100 mL IVPB        2 g 200 mL/hr over 30 Minutes Intravenous Every 8 hours 04/01/24 1137 04/14/24 2230   02/21/24 1000  vancomycin  (VANCOREADY) IVPB 1250 mg/250 mL        1,250 mg 166.7 mL/hr over 90 Minutes Intravenous Every 12 hours 02/20/24 2158 02/28/24 0130   02/16/24 1445  ceFEPIme  (MAXIPIME ) 2 g in sodium chloride  0.9 % 100 mL IVPB        2 g 200 mL/hr over 30 Minutes Intravenous Every 8 hours 02/16/24 1348 02/27/24 2322   02/16/24 0945  vancomycin  (VANCOREADY) IVPB 1500 mg/300 mL  Status:  Discontinued        1,500 mg 150 mL/hr over 120 Minutes Intravenous Every 12 hours 02/16/24 0848 02/20/24 2158   02/15/24 1215  Ampicillin -Sulbactam (UNASYN ) 3 g in sodium chloride  0.9 % 100 mL IVPB  Status:  Discontinued        3 g 200 mL/hr over 30 Minutes Intravenous Every 6 hours 02/15/24 1125 02/16/24 1226   01/24/24 2000  vancomycin  (VANCOREADY) IVPB 1250 mg/250 mL        1,250 mg 166.7 mL/hr over 90 Minutes Intravenous Every 24 hours  01/24/24 1154 01/27/24 0125   01/22/24 2200  Vancomycin  (VANCOCIN ) 1,250 mg in sodium chloride  0.9 % 250 mL IVPB  Status:  Discontinued        1,250 mg 166.7 mL/hr over 90 Minutes Intravenous Every 24 hours 01/22/24 1013 01/22/24 1020   01/22/24 2000  vancomycin  (VANCOREADY) IVPB 1250 mg/250 mL  Status:  Discontinued        1,250 mg 166.7 mL/hr over 90 Minutes Intravenous Every 24 hours 01/22/24 1020 01/24/24 1154   01/20/24 2000  ceFEPIme  (MAXIPIME ) 2 g in sodium chloride  0.9 % 100 mL IVPB  Status:  Discontinued  2 g 200 mL/hr over 30 Minutes Intravenous Every 8 hours 01/20/24 1900 01/24/24 1154   01/20/24 2000  Vancomycin  (VANCOCIN ) 1,250 mg in sodium chloride  0.9 % 250 mL IVPB  Status:  Discontinued        1,250 mg 166.7 mL/hr over 90 Minutes Intravenous Every 24 hours 01/20/24 1900 01/22/24 1013   01/03/24 1730  cefTRIAXone  (ROCEPHIN ) 1 g in sodium chloride  0.9 % 100 mL IVPB        1 g 200 mL/hr over 30 Minutes Intravenous Every 24 hours 01/03/24 1235 01/07/24 0710   01/02/24 1830  ceFEPIme  (MAXIPIME ) 2 g in sodium chloride  0.9 % 100 mL IVPB  Status:  Discontinued        2 g 200 mL/hr over 30 Minutes Intravenous Every 8 hours 01/02/24 1732 01/03/24 1235   01/02/24 1830  Vancomycin  (VANCOCIN ) 1,250 mg in sodium chloride  0.9 % 250 mL IVPB  Status:  Discontinued        1,250 mg 166.7 mL/hr over 90 Minutes Intravenous Every 24 hours 01/02/24 1732 01/04/24 1754   01/01/24 1200  cefTRIAXone  (ROCEPHIN ) 1 g in sodium chloride  0.9 % 100 mL IVPB  Status:  Discontinued        1 g 200 mL/hr over 30 Minutes Intravenous Every 24 hours 01/01/24 1057 01/02/24 1713   11/29/23 2200  linezolid  (ZYVOX ) tablet 600 mg        600 mg Per Tube Every 12 hours 11/29/23 1034 12/04/23 2215   11/28/23 1000  linezolid  (ZYVOX ) IVPB 600 mg  Status:  Discontinued        600 mg 300 mL/hr over 60 Minutes Intravenous Every 12 hours 11/28/23 0831 11/29/23 1034   11/23/23 1200  vancomycin  (VANCOREADY) IVPB 1250  mg/250 mL  Status:  Discontinued        1,250 mg 166.7 mL/hr over 90 Minutes Intravenous Every 24 hours 11/23/23 1036 11/28/23 0831   11/20/23 1100  vancomycin  (VANCOREADY) IVPB 1250 mg/250 mL  Status:  Discontinued        1,250 mg 166.7 mL/hr over 90 Minutes Intravenous Every 24 hours 11/20/23 0923 11/20/23 0923   11/20/23 1100  vancomycin  (VANCOREADY) IVPB 1250 mg/250 mL  Status:  Discontinued        1,250 mg 166.7 mL/hr over 90 Minutes Intravenous Every 24 hours 11/20/23 0923 11/23/23 1036   11/15/23 2300  vancomycin  (VANCOREADY) IVPB 1250 mg/250 mL        1,250 mg 166.7 mL/hr over 90 Minutes Intravenous Every 24 hours 11/15/23 2215 11/19/23 0924   11/12/23 2200  vancomycin  (VANCOREADY) IVPB 750 mg/150 mL  Status:  Discontinued        750 mg 150 mL/hr over 60 Minutes Intravenous Every 12 hours 11/12/23 0855 11/15/23 2215   11/12/23 0930  vancomycin  (VANCOREADY) IVPB 2000 mg/400 mL        2,000 mg 200 mL/hr over 120 Minutes Intravenous  Once 11/12/23 0838 11/12/23 1116   11/11/23 1345  Ampicillin -Sulbactam (UNASYN ) 3 g in sodium chloride  0.9 % 100 mL IVPB  Status:  Discontinued        3 g 200 mL/hr over 30 Minutes Intravenous Every 6 hours 11/11/23 1257 11/12/23 0836   10/28/23 0400  Ampicillin -Sulbactam (UNASYN ) 3 g in sodium chloride  0.9 % 100 mL IVPB  Status:  Discontinued        3 g 200 mL/hr over 30 Minutes Intravenous Every 6 hours 10/27/23 2151 11/02/23 0945   10/27/23 1845  vancomycin  (VANCOCIN ) IVPB 1000 mg/200  mL premix  Status:  Discontinued        1,000 mg 200 mL/hr over 60 Minutes Intravenous  Once 10/27/23 1843 10/27/23 1843   10/27/23 1845  ceFEPIme  (MAXIPIME ) 2 g in sodium chloride  0.9 % 100 mL IVPB        2 g 200 mL/hr over 30 Minutes Intravenous  Once 10/27/23 1843 10/27/23 2024   10/27/23 1845  vancomycin  (VANCOREADY) IVPB 2000 mg/400 mL        2,000 mg 200 mL/hr over 120 Minutes Intravenous  Once 10/27/23 1843 10/27/23 2123        Medications  Scheduled  Meds:  amLODipine   10 mg Per Tube Daily   artificial tears   Both Eyes BID   Chlorhexidine  Gluconate Cloth  6 each Topical Daily   enoxaparin  (LOVENOX ) injection  40 mg Subcutaneous Daily   famotidine   20 mg Per Tube BID   feeding supplement (JEVITY 1.5 CAL/FIBER)  237 mL Per Tube 5 X Daily   feeding supplement (PROSource TF20)  60 mL Per Tube TID   free water   150 mL Per Tube Q6H   insulin  aspart  0-9 Units Subcutaneous Q6H   metoprolol  tartrate  75 mg Per Tube BID   multivitamin with minerals  1 tablet Per Tube Daily   mouth rinse  15 mL Mouth Rinse Q2H   polyethylene glycol  17 g Per Tube Daily   trimethoprim -polymyxin b   2 drop Left Eye QID   Continuous Infusions: PRN Meds:.acetaminophen  **OR** acetaminophen  (TYLENOL ) oral liquid 160 mg/5 mL **OR** acetaminophen , bisacodyl , Gerhardt's butt cream, hydrALAZINE , ipratropium-albuterol , mouth rinse, traZODone     Subjective:   Garrett [name] was seen and examined today.  No events overnight.   Objective:   Vitals:   05/21/24 1300 05/21/24 1400 05/21/24 1428 05/21/24 1537  BP: (!) 130/95 (!) 138/100    Pulse: 79 79    Resp: (!) 25 (!) 36 (!) 28   Temp:    99.4 F (37.4 C)  TempSrc:      SpO2: 96% 94%    Weight:      Height:        Intake/Output Summary (Last 24 hours) at 05/21/2024 1607 Last data filed at 05/21/2024 1155 Gross per 24 hour  Intake 1224 ml  Output 1005 ml  Net 219 ml   Filed Weights   05/19/24 0500 05/20/24 0428 05/21/24 0500  Weight: 94 kg 96 kg 94 kg     Exam General exam: ill appearing gentleman, not in distress. does not interact or follow commands.  Respiratory system: diminished at bases, s/p trach on collar Cardiovascular system: S1 & S2 heard, RRR. No JVD,  Gastrointestinal system: Abdomen is soft bs+ s/p PEG Central nervous system: unresponsive, does not follow commands.  Extremities: no edema.  Skin: No rashes,   Data Reviewed:  I have personally reviewed following labs and  imaging studies   CBC Lab Results  Component Value Date   WBC 11.6 (H) 05/17/2024   RBC 3.81 (L) 05/17/2024   HGB 10.3 (L) 05/17/2024   HCT 34.9 (L) 05/17/2024   MCV 91.6 05/17/2024   MCH 27.0 05/17/2024   PLT 368 05/17/2024   MCHC 29.5 (L) 05/17/2024   RDW 16.2 (H) 05/17/2024   LYMPHSABS 1.3 05/17/2024   MONOABS 0.8 05/17/2024   EOSABS 0.2 05/17/2024   BASOSABS 0.0 05/17/2024     Last metabolic panel Lab Results  Component Value Date   NA 143 05/12/2024   K 3.6 05/12/2024  CL 100 05/12/2024   CO2 32 05/12/2024   BUN 23 (H) 05/12/2024   CREATININE 0.44 (L) 05/12/2024   GLUCOSE 124 (H) 05/12/2024   GFRNONAA >60 05/12/2024   GFRAA >60 01/15/2018   CALCIUM 9.2 05/12/2024   PHOS 4.0 05/11/2024   PROT 7.9 05/12/2024   ALBUMIN  3.0 (L) 05/12/2024   BILITOT 0.2 05/12/2024   ALKPHOS 65 05/12/2024   AST 50 (H) 05/12/2024   ALT 115 (H) 05/12/2024   ANIONGAP 11 05/12/2024    CBG (last 3)  Recent Labs    05/20/24 2323 05/21/24 0551 05/21/24 1121  GLUCAP 141* 103* 160*      Coagulation Profile: No results for input(s): INR, PROTIME in the last 168 hours.   Radiology Studies: No results found.     Elgie Butter M.D. [facility] 05/21/2024, 4:07 PM  Available via Epic secure chat 7am-7pm After 7 pm, please refer to night coverage provider listed on amion.

## 2024-05-22 DIAGNOSIS — Z789 Other specified health status: Secondary | ICD-10-CM | POA: Diagnosis not present

## 2024-05-22 DIAGNOSIS — Z930 Tracheostomy status: Secondary | ICD-10-CM | POA: Diagnosis not present

## 2024-05-22 DIAGNOSIS — J9611 Chronic respiratory failure with hypoxia: Secondary | ICD-10-CM | POA: Diagnosis not present

## 2024-05-22 DIAGNOSIS — I619 Nontraumatic intracerebral hemorrhage, unspecified: Secondary | ICD-10-CM | POA: Diagnosis not present

## 2024-05-22 LAB — CBC WITH DIFFERENTIAL/PLATELET
Abs Immature Granulocytes: 0.03 K/uL (ref 0.00–0.07)
Basophils Absolute: 0 K/uL (ref 0.0–0.1)
Basophils Relative: 0 %
Eosinophils Absolute: 0.1 K/uL (ref 0.0–0.5)
Eosinophils Relative: 1 %
HCT: 35 % — ABNORMAL LOW (ref 39.0–52.0)
Hemoglobin: 10.7 g/dL — ABNORMAL LOW (ref 13.0–17.0)
Immature Granulocytes: 0 %
Lymphocytes Relative: 19 %
Lymphs Abs: 1.9 K/uL (ref 0.7–4.0)
MCH: 27.3 pg (ref 26.0–34.0)
MCHC: 30.6 g/dL (ref 30.0–36.0)
MCV: 89.3 fL (ref 80.0–100.0)
Monocytes Absolute: 0.8 K/uL (ref 0.1–1.0)
Monocytes Relative: 8 %
Neutro Abs: 7.5 K/uL (ref 1.7–7.7)
Neutrophils Relative %: 72 %
Platelets: 444 K/uL — ABNORMAL HIGH (ref 150–400)
RBC: 3.92 MIL/uL — ABNORMAL LOW (ref 4.22–5.81)
RDW: 16.3 % — ABNORMAL HIGH (ref 11.5–15.5)
WBC: 10.5 K/uL (ref 4.0–10.5)
nRBC: 0 % (ref 0.0–0.2)

## 2024-05-22 LAB — BASIC METABOLIC PANEL WITH GFR
Anion gap: 11 (ref 5–15)
BUN: 22 mg/dL — ABNORMAL HIGH (ref 6–20)
CO2: 28 mmol/L (ref 22–32)
Calcium: 9 mg/dL (ref 8.9–10.3)
Chloride: 99 mmol/L (ref 98–111)
Creatinine, Ser: 0.43 mg/dL — ABNORMAL LOW (ref 0.61–1.24)
GFR, Estimated: >60 mL/min (ref >60)
Glucose, Bld: 134 mg/dL — ABNORMAL HIGH (ref 70–99)
Potassium: 4.5 mmol/L (ref 3.5–5.1)
Sodium: 138 mmol/L (ref 135–145)

## 2024-05-22 LAB — GLUCOSE, CAPILLARY
Glucose-Capillary: 108 mg/dL — ABNORMAL HIGH (ref 70–99)
Glucose-Capillary: 140 mg/dL — ABNORMAL HIGH (ref 70–99)
Glucose-Capillary: 105 mg/dL — ABNORMAL HIGH (ref 70–99)
Glucose-Capillary: 147 mg/dL — ABNORMAL HIGH (ref 70–99)
Glucose-Capillary: 160 mg/dL — ABNORMAL HIGH (ref 70–99)

## 2024-05-22 NOTE — Progress Notes (Signed)
 [facility]                                                                               Garrett [name], is a 48 y.o. male, DOB - [birth year], FMW:[id number] Admit date - 10/27/2023    Outpatient Primary MD for the patient is Pcp, No  LOS - 208  days    Brief summary   48 y/o male with past medical history of hypertension was found down at home agonal breathing. History of uncontrolled hypertension but never had been to the doctor. Patient was noted to have a large (4.1 cm) hemorrhage centered in the pons with intraventricular extension into the fourth ventricle and also extension into the basal cisterns and trace additional subarachnoid hemorrhage along the left parietal convexity. BP in ED was 262/156. CXR revealed RUL and perihilar infiltrates suggesting aspiration pneumonia. ED labs also revealed PH 7.169, LA 4.4, CPK 985. Patient was intubated in ED and was admitted hospital.    Patient had prolonged complicated hospital stay pontine hemorrhage, biparietal SAH.  Patient's mental status poor, s/p tracheostomy and PEG placement.  Sputum cultures positive for MRSA on contact precautions.  Patient had prolonged difficulty weaning from vent.  Patient was on trach collar for few days but later required ventilatory support mostly at nights and is currently in ICU.   No events overnight. Currently waiting to hear from [facility].   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 BP parameters are optimal today.  -Mental status continues to be poor; he is opening his eyes to tactile stimulus.  Poor chance of any meaning full recovery. But continue to monitor.    Acute on chronic resp failure w hypoxia and hypercarbia  Trach  dependence -S/p tracheostomy redo 7/16.  Not a candidate for decannulation given ineffective airway clearance, -Continue trach collar during day with vent support at night. He is on 8 lit of oxygen via trach collar.  -Continue trach care per protocol. -Pulmonary follow up appreciated.    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 Waylan evaluated and placed tarsorraphy.   -8/29-ophtho rec: Erythromycin  eyedrop q2h, tape eyes with paper tape if not blinking. -9/14, ophtho, Dr. Leane reengaged due to worsening conjunctivitis and clouding recommended second antibiotic, gatifloxacin  4 times daily.per Optho, no epithelial defects noted. - 9/25: courses completed of abx drops; continue on opth ointment and cover left eye with patch and/or tape to keep lid closed  - 9/29, more injected with discharge again in OS, resume 5 day course of Polytrim  eyedrops to left eye - slowly improving.    Pseudomonal colonization -Trach culture aspirate obtained on 05/10/2024 due to SIRS criteria; fever defervesced without antibiotics and no increase in secretions or decline clinically  -Continue to evaluate any recurrent SIRS criteria in clinical context T max of 99.5 . Will get labs today.    Hypernatremia: Resolved. - Continue free water  at 150 cc every 6 hours. - Intermittently monitor labs - repeat labs today.  Elevated liver enzymes: Mild.  Unclear etiology.  CK normal. - Check intermittently.   Inadequate oral intake - PEG replaced by IR on 05/15/24 after becoming dislodged again Body mass index is 32.18 kg/m. Nutrition Problem: Inadequate oral intake Etiology: inability to eat Signs/Symptoms: NPO status Interventions: Tube feeding, Prostat, MVI, Juven    Malnutrition Type:  Nutrition Problem: Inadequate oral intake Etiology: inability to eat   Malnutrition Characteristics:  Signs/Symptoms: NPO status   Nutrition Interventions:  Interventions: Tube feeding, Prostat, MVI,  Juven  Estimated body mass index is 31.51 kg/m as calculated from the following:   Height as of this encounter: 5' 8 (1.727 m).   Weight as of this encounter: 94 kg.  Code Status: full code.  DVT Prophylaxis:  enoxaparin  (LOVENOX ) injection 40 mg Start: 03/06/24 1000 SCDs Start: 10/27/23 2226   Level of Care: Level of care: ICU Family Communication:none at bedside.   Disposition Plan:     Remains inpatient appropriate:  pending.      Consultants:   PCCM.  Ophthalmology.   Antimicrobials:   Anti-infectives (From admission, onward)    Start     Dose/Rate Route Frequency Ordered Stop   04/09/24 2200  linezolid  (ZYVOX ) tablet 600 mg  Status:  Discontinued        600 mg Per Tube Every 12 hours 04/09/24 1132 04/13/24 1332   04/09/24 1700  vancomycin  (VANCOREADY) IVPB 1500 mg/300 mL  Status:  Discontinued        1,500 mg 150 mL/hr over 120 Minutes Intravenous Every 12 hours 04/09/24 1108 04/09/24 1132   04/09/24 1000  linezolid  (ZYVOX ) IVPB 600 mg  Status:  Discontinued        600 mg 300 mL/hr over 60 Minutes Intravenous Every 12 hours 04/09/24 0527 04/09/24 1108   04/01/24 1145  ceFEPIme  (MAXIPIME ) 2 g in sodium chloride  0.9 % 100 mL IVPB        2 g 200 mL/hr over 30 Minutes Intravenous Every 8 hours 04/01/24 1137 04/14/24 2230   02/21/24 1000  vancomycin  (VANCOREADY) IVPB 1250 mg/250 mL        1,250 mg 166.7 mL/hr over 90 Minutes Intravenous Every 12 hours 02/20/24 2158 02/28/24 0130   02/16/24 1445  ceFEPIme  (MAXIPIME ) 2 g in sodium chloride  0.9 % 100 mL IVPB        2 g 200 mL/hr over 30 Minutes Intravenous Every 8 hours 02/16/24 1348 02/27/24 2322   02/16/24 0945  vancomycin  (VANCOREADY) IVPB 1500 mg/300 mL  Status:  Discontinued        1,500 mg 150 mL/hr over 120 Minutes Intravenous Every 12 hours 02/16/24 0848 02/20/24 2158   02/15/24 1215  Ampicillin -Sulbactam (UNASYN ) 3 g in sodium chloride  0.9 % 100 mL IVPB  Status:  Discontinued        3 g 200 mL/hr over 30  Minutes Intravenous Every 6 hours 02/15/24 1125 02/16/24 1226   01/24/24 2000  vancomycin  (VANCOREADY) IVPB 1250 mg/250 mL        1,250 mg 166.7 mL/hr over 90 Minutes Intravenous Every 24 hours 01/24/24 1154 01/27/24 0125   01/22/24 2200  Vancomycin  (VANCOCIN ) 1,250 mg in sodium chloride  0.9 % 250 mL IVPB  Status:  Discontinued        1,250 mg 166.7 mL/hr over 90 Minutes Intravenous Every 24 hours 01/22/24 1013 01/22/24 1020   01/22/24 2000  vancomycin  (VANCOREADY) IVPB 1250 mg/250 mL  Status:  Discontinued        1,250 mg 166.7 mL/hr  over 90 Minutes Intravenous Every 24 hours 01/22/24 1020 01/24/24 1154   01/20/24 2000  ceFEPIme  (MAXIPIME ) 2 g in sodium chloride  0.9 % 100 mL IVPB  Status:  Discontinued        2 g 200 mL/hr over 30 Minutes Intravenous Every 8 hours 01/20/24 1900 01/24/24 1154   01/20/24 2000  Vancomycin  (VANCOCIN ) 1,250 mg in sodium chloride  0.9 % 250 mL IVPB  Status:  Discontinued        1,250 mg 166.7 mL/hr over 90 Minutes Intravenous Every 24 hours 01/20/24 1900 01/22/24 1013   01/03/24 1730  cefTRIAXone  (ROCEPHIN ) 1 g in sodium chloride  0.9 % 100 mL IVPB        1 g 200 mL/hr over 30 Minutes Intravenous Every 24 hours 01/03/24 1235 01/07/24 0710   01/02/24 1830  ceFEPIme  (MAXIPIME ) 2 g in sodium chloride  0.9 % 100 mL IVPB  Status:  Discontinued        2 g 200 mL/hr over 30 Minutes Intravenous Every 8 hours 01/02/24 1732 01/03/24 1235   01/02/24 1830  Vancomycin  (VANCOCIN ) 1,250 mg in sodium chloride  0.9 % 250 mL IVPB  Status:  Discontinued        1,250 mg 166.7 mL/hr over 90 Minutes Intravenous Every 24 hours 01/02/24 1732 01/04/24 1754   01/01/24 1200  cefTRIAXone  (ROCEPHIN ) 1 g in sodium chloride  0.9 % 100 mL IVPB  Status:  Discontinued        1 g 200 mL/hr over 30 Minutes Intravenous Every 24 hours 01/01/24 1057 01/02/24 1713   11/29/23 2200  linezolid  (ZYVOX ) tablet 600 mg        600 mg Per Tube Every 12 hours 11/29/23 1034 12/04/23 2215   11/28/23 1000   linezolid  (ZYVOX ) IVPB 600 mg  Status:  Discontinued        600 mg 300 mL/hr over 60 Minutes Intravenous Every 12 hours 11/28/23 0831 11/29/23 1034   11/23/23 1200  vancomycin  (VANCOREADY) IVPB 1250 mg/250 mL  Status:  Discontinued        1,250 mg 166.7 mL/hr over 90 Minutes Intravenous Every 24 hours 11/23/23 1036 11/28/23 0831   11/20/23 1100  vancomycin  (VANCOREADY) IVPB 1250 mg/250 mL  Status:  Discontinued        1,250 mg 166.7 mL/hr over 90 Minutes Intravenous Every 24 hours 11/20/23 0923 11/20/23 0923   11/20/23 1100  vancomycin  (VANCOREADY) IVPB 1250 mg/250 mL  Status:  Discontinued        1,250 mg 166.7 mL/hr over 90 Minutes Intravenous Every 24 hours 11/20/23 0923 11/23/23 1036   11/15/23 2300  vancomycin  (VANCOREADY) IVPB 1250 mg/250 mL        1,250 mg 166.7 mL/hr over 90 Minutes Intravenous Every 24 hours 11/15/23 2215 11/19/23 0924   11/12/23 2200  vancomycin  (VANCOREADY) IVPB 750 mg/150 mL  Status:  Discontinued        750 mg 150 mL/hr over 60 Minutes Intravenous Every 12 hours 11/12/23 0855 11/15/23 2215   11/12/23 0930  vancomycin  (VANCOREADY) IVPB 2000 mg/400 mL        2,000 mg 200 mL/hr over 120 Minutes Intravenous  Once 11/12/23 0838 11/12/23 1116   11/11/23 1345  Ampicillin -Sulbactam (UNASYN ) 3 g in sodium chloride  0.9 % 100 mL IVPB  Status:  Discontinued        3 g 200 mL/hr over 30 Minutes Intravenous Every 6 hours 11/11/23 1257 11/12/23 0836   10/28/23 0400  Ampicillin -Sulbactam (UNASYN ) 3 g in sodium chloride  0.9 %  100 mL IVPB  Status:  Discontinued        3 g 200 mL/hr over 30 Minutes Intravenous Every 6 hours 10/27/23 2151 11/02/23 0945   10/27/23 1845  vancomycin  (VANCOCIN ) IVPB 1000 mg/200 mL premix  Status:  Discontinued        1,000 mg 200 mL/hr over 60 Minutes Intravenous  Once 10/27/23 1843 10/27/23 1843   10/27/23 1845  ceFEPIme  (MAXIPIME ) 2 g in sodium chloride  0.9 % 100 mL IVPB        2 g 200 mL/hr over 30 Minutes Intravenous  Once 10/27/23 1843  10/27/23 2024   10/27/23 1845  vancomycin  (VANCOREADY) IVPB 2000 mg/400 mL        2,000 mg 200 mL/hr over 120 Minutes Intravenous  Once 10/27/23 1843 10/27/23 2123        Medications  Scheduled Meds:  amLODipine   10 mg Per Tube Daily   artificial tears   Both Eyes BID   Chlorhexidine  Gluconate Cloth  6 each Topical Daily   enoxaparin  (LOVENOX ) injection  40 mg Subcutaneous Daily   famotidine   20 mg Per Tube BID   feeding supplement (JEVITY 1.5 CAL/FIBER)  237 mL Per Tube 5 X Daily   feeding supplement (PROSource TF20)  60 mL Per Tube TID   free water   150 mL Per Tube Q6H   insulin  aspart  0-9 Units Subcutaneous Q6H   metoprolol  tartrate  75 mg Per Tube BID   multivitamin with minerals  1 tablet Per Tube Daily   mouth rinse  15 mL Mouth Rinse Q2H   polyethylene glycol  17 g Per Tube Daily   trimethoprim -polymyxin b   2 drop Left Eye QID   Continuous Infusions: PRN Meds:.acetaminophen  **OR** acetaminophen  (TYLENOL ) oral liquid 160 mg/5 mL **OR** acetaminophen , bisacodyl , Gerhardt's butt cream, hydrALAZINE , ipratropium-albuterol , mouth rinse, traZODone     Subjective:   Garrett [name] was seen and examined today.  No events overnight.   Objective:   Vitals:   05/22/24 1000 05/22/24 1100 05/22/24 1116 05/22/24 1127  BP: 128/88 118/85    Pulse: 77 80    Resp: (!) 27 (!) 23  (!) 21  Temp:   98.7 F (37.1 C)   TempSrc:   Axillary   SpO2: 96% 95%    Weight:      Height:        Intake/Output Summary (Last 24 hours) at 05/22/2024 1423 Last data filed at 05/22/2024 1201 Gross per 24 hour  Intake 1144 ml  Output 1400 ml  Net -256 ml   Filed Weights   05/20/24 0428 05/21/24 0500 05/22/24 0500  Weight: 96 kg 94 kg 94 kg     Exam General exam: Ill appearing gentleman,  Respiratory system: Clear to auscultation. S/p trach on collar this am with oxygen.  Cardiovascular system: S1 & S2 heard, RRR. No JVD Gastrointestinal system: Abdomen is soft BS+ Central nervous  system: Alert, opening eyes with tactile stumuli, not following commands.  Extremities: no cyanosis.  Skin: No rashes,      Data Reviewed:  I have personally reviewed following labs and imaging studies   CBC Lab Results  Component Value Date   WBC 11.6 (H) 05/17/2024   RBC 3.81 (L) 05/17/2024   HGB 10.3 (L) 05/17/2024   HCT 34.9 (L) 05/17/2024   MCV 91.6 05/17/2024   MCH 27.0 05/17/2024   PLT 368 05/17/2024   MCHC 29.5 (L) 05/17/2024   RDW 16.2 (H) 05/17/2024   LYMPHSABS 1.3 05/17/2024  MONOABS 0.8 05/17/2024   EOSABS 0.2 05/17/2024   BASOSABS 0.0 05/17/2024     Last metabolic panel Lab Results  Component Value Date   NA 143 05/12/2024   K 3.6 05/12/2024   CL 100 05/12/2024   CO2 32 05/12/2024   BUN 23 (H) 05/12/2024   CREATININE 0.44 (L) 05/12/2024   GLUCOSE 124 (H) 05/12/2024   GFRNONAA >60 05/12/2024   GFRAA >60 01/15/2018   CALCIUM 9.2 05/12/2024   PHOS 4.0 05/11/2024   PROT 7.9 05/12/2024   ALBUMIN  3.0 (L) 05/12/2024   BILITOT 0.2 05/12/2024   ALKPHOS 65 05/12/2024   AST 50 (H) 05/12/2024   ALT 115 (H) 05/12/2024   ANIONGAP 11 05/12/2024    CBG (last 3)  Recent Labs    05/21/24 1121 05/21/24 1758 05/21/24 2326  GLUCAP 160* 151* 136*      Coagulation Profile: No results for input(s): INR, PROTIME in the last 168 hours.   Radiology Studies: No results found.     Elgie Butter M.D. [facility] 05/22/2024, 2:23 PM  Available via Epic secure chat 7am-7pm After 7 pm, please refer to night coverage provider listed on amion.

## 2024-05-22 NOTE — Plan of Care (Signed)
  Problem: Intracerebral Hemorrhage Tissue Perfusion: Goal: Complications of Intracerebral Hemorrhage will be minimized Outcome: Progressing   Problem: Nutrition: Goal: Risk of aspiration will decrease Outcome: Progressing Goal: Dietary intake will improve Outcome: Progressing   Problem: Fluid Volume: Goal: Ability to maintain a balanced intake and output will improve Outcome: Progressing   Problem: Skin Integrity: Goal: Risk for impaired skin integrity will decrease Outcome: Progressing   Problem: Tissue Perfusion: Goal: Adequacy of tissue perfusion will improve Outcome: Progressing   Problem: Clinical Measurements: Goal: Ability to maintain clinical measurements within normal limits will improve Outcome: Progressing Goal: Will remain free from infection Outcome: Progressing Goal: Diagnostic test results will improve Outcome: Progressing Goal: Respiratory complications will improve Outcome: Progressing Goal: Cardiovascular complication will be avoided Outcome: Progressing   Problem: Activity: Goal: Risk for activity intolerance will decrease Outcome: Progressing   Problem: Nutrition: Goal: Adequate nutrition will be maintained Outcome: Progressing   Problem: Elimination: Goal: Will not experience complications related to bowel motility Outcome: Progressing Goal: Will not experience complications related to urinary retention Outcome: Progressing   Problem: Pain Managment: Goal: General experience of comfort will improve and/or be controlled Outcome: Progressing   Problem: Safety: Goal: Ability to remain free from injury will improve Outcome: Progressing   Problem: Skin Integrity: Goal: Risk for impaired skin integrity will decrease Outcome: Progressing   Problem: Activity: Goal: Ability to tolerate increased activity will improve Outcome: Progressing   Problem: Respiratory: Goal: Patent airway maintenance will improve Outcome: Progressing   Problem:  Activity: Goal: Ability to tolerate increased activity will improve Outcome: Progressing   Problem: Respiratory: Goal: Ability to maintain a clear airway and adequate ventilation will improve Outcome: Progressing   Problem: Role Relationship: Goal: Method of communication will improve Outcome: Progressing   Problem: Education: Goal: Knowledge of disease or condition will improve Outcome: Progressing   Problem: Intracerebral Hemorrhage Tissue Perfusion: Goal: Complications of Intracerebral Hemorrhage will be minimized Outcome: Progressing   Problem: Self-Care: Goal: Verbalization of feelings and concerns over difficulty with self-care will improve Outcome: Progressing Goal: Ability to communicate needs accurately will improve Outcome: Progressing   Problem: Nutrition: Goal: Risk of aspiration will decrease Outcome: Progressing Goal: Dietary intake will improve Outcome: Progressing   Problem: Education: Goal: Knowledge of disease or condition will improve Outcome: Progressing Goal: Knowledge of secondary prevention will improve (MUST DOCUMENT ALL) Outcome: Progressing Goal: Knowledge of patient specific risk factors will improve (DELETE if not current risk factor) Outcome: Progressing   Problem: Intracerebral Hemorrhage Tissue Perfusion: Goal: Complications of Intracerebral Hemorrhage will be minimized Outcome: Progressing   Problem: Coping: Goal: Will verbalize positive feelings about self Outcome: Progressing Goal: Will identify appropriate support needs Outcome: Progressing   Problem: Health Behavior/Discharge Planning: Goal: Ability to manage health-related needs will improve Outcome: Progressing Goal: Goals will be collaboratively established with patient/family Outcome: Progressing   Problem: Self-Care: Goal: Ability to participate in self-care as condition permits will improve Outcome: Progressing Goal: Verbalization of feelings and concerns over  difficulty with self-care will improve Outcome: Progressing Goal: Ability to communicate needs accurately will improve Outcome: Progressing   Problem: Nutrition: Goal: Risk of aspiration will decrease Outcome: Progressing Goal: Dietary intake will improve Outcome: Progressing

## 2024-05-22 NOTE — Plan of Care (Signed)
     Brief palliative note:   Chart reviewed. Discussed with primary team. Goals are clear for full code and full scope.  PMT will sign-off at this time. Please place a new consult if further palliative needs arise.    Recardo Loll, NP-C Palliative Medicine   Please call Palliative Medicine team phone with any questions [phone]. For individual providers please see AMION.   No charge

## 2024-05-23 DIAGNOSIS — J9611 Chronic respiratory failure with hypoxia: Secondary | ICD-10-CM | POA: Diagnosis not present

## 2024-05-23 DIAGNOSIS — Z789 Other specified health status: Secondary | ICD-10-CM | POA: Diagnosis not present

## 2024-05-23 DIAGNOSIS — I619 Nontraumatic intracerebral hemorrhage, unspecified: Secondary | ICD-10-CM | POA: Diagnosis not present

## 2024-05-23 DIAGNOSIS — Z930 Tracheostomy status: Secondary | ICD-10-CM | POA: Diagnosis not present

## 2024-05-23 LAB — GLUCOSE, CAPILLARY
Glucose-Capillary: 130 mg/dL — ABNORMAL HIGH (ref 70–99)
Glucose-Capillary: 130 mg/dL — ABNORMAL HIGH (ref 70–99)

## 2024-05-23 LAB — MRSA NEXT GEN BY PCR, NASAL: MRSA by PCR Next Gen: DETECTED — AB

## 2024-05-23 NOTE — Progress Notes (Signed)
 [facility]                                                                               Garrett [name], is a 48 y.o. male, DOB - [birth year], FMW:[id number] Admit date - 10/27/2023    Outpatient Primary MD for the patient is Pcp, No  LOS - 209  days    Brief summary   48 y/o male with past medical history of hypertension was found down at home agonal breathing. History of uncontrolled hypertension but never had been to the doctor. Patient was noted to have a large (4.1 cm) hemorrhage centered in the pons with intraventricular extension into the fourth ventricle and also extension into the basal cisterns and trace additional subarachnoid hemorrhage along the left parietal convexity. BP in ED was 262/156. CXR revealed RUL and perihilar infiltrates suggesting aspiration pneumonia. ED labs also revealed PH 7.169, LA 4.4, CPK 985. Patient was intubated in ED and was admitted hospital.    Patient had prolonged complicated hospital stay pontine hemorrhage, biparietal SAH.  Patient's mental status poor, s/p tracheostomy and PEG placement.  Sputum cultures positive for MRSA on contact precautions.  Patient had prolonged difficulty weaning from vent.  Patient was on trach collar for few days but later required ventilatory support mostly at nights and is currently in ICU.   No events overnight. Currently waiting to hear from [facility]. Trach changed without any issues.   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 BP parameters are optimal today.  Poor chance of any meaning full recovery. But continue to monitor.    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  Not a candidate  for decannulation given ineffective airway clearance, -Continue trach collar during day with vent support at night. He is on 8 lit of oxygen via trach collar.  -Continue trach care per protocol. -Pulmonary follow up appreciated.  - trach changed today.    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 Waylan evaluated and placed tarsorraphy.   -8/29-ophtho rec: Erythromycin  eyedrop q2h, tape eyes with paper tape if not blinking. -9/14, ophtho, Dr. Leane reengaged due to worsening conjunctivitis and clouding recommended second antibiotic, gatifloxacin  4 times daily.per Optho, no epithelial defects noted. - 9/25: courses completed of abx drops; continue on opth ointment and cover left eye with patch and/or tape to keep lid closed  - 9/29, more injected with discharge again in OS, resume 5 day course of Polytrim  eyedrops to left eye - slowly improving.    Pseudomonal colonization -Trach culture aspirate obtained on 05/10/2024 due to SIRS criteria; fever defervesced without antibiotics and no increase in secretions or decline clinically  -Continue to evaluate any recurrent SIRS criteria in clinical context - afebrile, wbc count wnl.    Hypernatremia: Resolved. - Continue free water  at 150 cc every 6 hours. - Intermittently monitor labs - repeat sodium levels wnl.    Elevated liver enzymes: Mild.  Unclear  etiology.  CK normal. - Check intermittently.   Inadequate oral intake - PEG replaced by IR on 05/15/24 after becoming dislodged again Body mass index is 32.18 kg/m. Nutrition Problem: Inadequate oral intake Etiology: inability to eat Signs/Symptoms: NPO status Interventions: Tube feeding, Prostat, MVI, Juven    Malnutrition Type:  Nutrition Problem: Inadequate oral intake Etiology: inability to eat   Malnutrition Characteristics:  Signs/Symptoms: NPO status   Nutrition Interventions:  Interventions: Tube feeding, Prostat, MVI, Juven  Estimated body mass index is  30.84 kg/m as calculated from the following:   Height as of this encounter: 5' 8 (1.727 m).   Weight as of this encounter: 92 kg.  Code Status: full code.  DVT Prophylaxis:  enoxaparin  (LOVENOX ) injection 40 mg Start: 03/06/24 1000 SCDs Start: 10/27/23 2226   Level of Care: Level of care: ICU Family Communication:none at bedside.   Disposition Plan:     Remains inpatient appropriate:  pending.      Consultants:   PCCM.  Ophthalmology.   Antimicrobials:   Anti-infectives (From admission, onward)    Start     Dose/Rate Route Frequency Ordered Stop   04/09/24 2200  linezolid  (ZYVOX ) tablet 600 mg  Status:  Discontinued        600 mg Per Tube Every 12 hours 04/09/24 1132 04/13/24 1332   04/09/24 1700  vancomycin  (VANCOREADY) IVPB 1500 mg/300 mL  Status:  Discontinued        1,500 mg 150 mL/hr over 120 Minutes Intravenous Every 12 hours 04/09/24 1108 04/09/24 1132   04/09/24 1000  linezolid  (ZYVOX ) IVPB 600 mg  Status:  Discontinued        600 mg 300 mL/hr over 60 Minutes Intravenous Every 12 hours 04/09/24 0527 04/09/24 1108   04/01/24 1145  ceFEPIme  (MAXIPIME ) 2 g in sodium chloride  0.9 % 100 mL IVPB        2 g 200 mL/hr over 30 Minutes Intravenous Every 8 hours 04/01/24 1137 04/14/24 2230   02/21/24 1000  vancomycin  (VANCOREADY) IVPB 1250 mg/250 mL        1,250 mg 166.7 mL/hr over 90 Minutes Intravenous Every 12 hours 02/20/24 2158 02/28/24 0130   02/16/24 1445  ceFEPIme  (MAXIPIME ) 2 g in sodium chloride  0.9 % 100 mL IVPB        2 g 200 mL/hr over 30 Minutes Intravenous Every 8 hours 02/16/24 1348 02/27/24 2322   02/16/24 0945  vancomycin  (VANCOREADY) IVPB 1500 mg/300 mL  Status:  Discontinued        1,500 mg 150 mL/hr over 120 Minutes Intravenous Every 12 hours 02/16/24 0848 02/20/24 2158   02/15/24 1215  Ampicillin -Sulbactam (UNASYN ) 3 g in sodium chloride  0.9 % 100 mL IVPB  Status:  Discontinued        3 g 200 mL/hr over 30 Minutes Intravenous Every 6 hours 02/15/24  1125 02/16/24 1226   01/24/24 2000  vancomycin  (VANCOREADY) IVPB 1250 mg/250 mL        1,250 mg 166.7 mL/hr over 90 Minutes Intravenous Every 24 hours 01/24/24 1154 01/27/24 0125   01/22/24 2200  Vancomycin  (VANCOCIN ) 1,250 mg in sodium chloride  0.9 % 250 mL IVPB  Status:  Discontinued        1,250 mg 166.7 mL/hr over 90 Minutes Intravenous Every 24 hours 01/22/24 1013 01/22/24 1020   01/22/24 2000  vancomycin  (VANCOREADY) IVPB 1250 mg/250 mL  Status:  Discontinued        1,250 mg 166.7 mL/hr over 90 Minutes Intravenous Every 24  hours 01/22/24 1020 01/24/24 1154   01/20/24 2000  ceFEPIme  (MAXIPIME ) 2 g in sodium chloride  0.9 % 100 mL IVPB  Status:  Discontinued        2 g 200 mL/hr over 30 Minutes Intravenous Every 8 hours 01/20/24 1900 01/24/24 1154   01/20/24 2000  Vancomycin  (VANCOCIN ) 1,250 mg in sodium chloride  0.9 % 250 mL IVPB  Status:  Discontinued        1,250 mg 166.7 mL/hr over 90 Minutes Intravenous Every 24 hours 01/20/24 1900 01/22/24 1013   01/03/24 1730  cefTRIAXone  (ROCEPHIN ) 1 g in sodium chloride  0.9 % 100 mL IVPB        1 g 200 mL/hr over 30 Minutes Intravenous Every 24 hours 01/03/24 1235 01/07/24 0710   01/02/24 1830  ceFEPIme  (MAXIPIME ) 2 g in sodium chloride  0.9 % 100 mL IVPB  Status:  Discontinued        2 g 200 mL/hr over 30 Minutes Intravenous Every 8 hours 01/02/24 1732 01/03/24 1235   01/02/24 1830  Vancomycin  (VANCOCIN ) 1,250 mg in sodium chloride  0.9 % 250 mL IVPB  Status:  Discontinued        1,250 mg 166.7 mL/hr over 90 Minutes Intravenous Every 24 hours 01/02/24 1732 01/04/24 1754   01/01/24 1200  cefTRIAXone  (ROCEPHIN ) 1 g in sodium chloride  0.9 % 100 mL IVPB  Status:  Discontinued        1 g 200 mL/hr over 30 Minutes Intravenous Every 24 hours 01/01/24 1057 01/02/24 1713   11/29/23 2200  linezolid  (ZYVOX ) tablet 600 mg        600 mg Per Tube Every 12 hours 11/29/23 1034 12/04/23 2215   11/28/23 1000  linezolid  (ZYVOX ) IVPB 600 mg  Status:   Discontinued        600 mg 300 mL/hr over 60 Minutes Intravenous Every 12 hours 11/28/23 0831 11/29/23 1034   11/23/23 1200  vancomycin  (VANCOREADY) IVPB 1250 mg/250 mL  Status:  Discontinued        1,250 mg 166.7 mL/hr over 90 Minutes Intravenous Every 24 hours 11/23/23 1036 11/28/23 0831   11/20/23 1100  vancomycin  (VANCOREADY) IVPB 1250 mg/250 mL  Status:  Discontinued        1,250 mg 166.7 mL/hr over 90 Minutes Intravenous Every 24 hours 11/20/23 0923 11/20/23 0923   11/20/23 1100  vancomycin  (VANCOREADY) IVPB 1250 mg/250 mL  Status:  Discontinued        1,250 mg 166.7 mL/hr over 90 Minutes Intravenous Every 24 hours 11/20/23 0923 11/23/23 1036   11/15/23 2300  vancomycin  (VANCOREADY) IVPB 1250 mg/250 mL        1,250 mg 166.7 mL/hr over 90 Minutes Intravenous Every 24 hours 11/15/23 2215 11/19/23 0924   11/12/23 2200  vancomycin  (VANCOREADY) IVPB 750 mg/150 mL  Status:  Discontinued        750 mg 150 mL/hr over 60 Minutes Intravenous Every 12 hours 11/12/23 0855 11/15/23 2215   11/12/23 0930  vancomycin  (VANCOREADY) IVPB 2000 mg/400 mL        2,000 mg 200 mL/hr over 120 Minutes Intravenous  Once 11/12/23 0838 11/12/23 1116   11/11/23 1345  Ampicillin -Sulbactam (UNASYN ) 3 g in sodium chloride  0.9 % 100 mL IVPB  Status:  Discontinued        3 g 200 mL/hr over 30 Minutes Intravenous Every 6 hours 11/11/23 1257 11/12/23 0836   10/28/23 0400  Ampicillin -Sulbactam (UNASYN ) 3 g in sodium chloride  0.9 % 100 mL IVPB  Status:  Discontinued        3 g 200 mL/hr over 30 Minutes Intravenous Every 6 hours 10/27/23 2151 11/02/23 0945   10/27/23 1845  vancomycin  (VANCOCIN ) IVPB 1000 mg/200 mL premix  Status:  Discontinued        1,000 mg 200 mL/hr over 60 Minutes Intravenous  Once 10/27/23 1843 10/27/23 1843   10/27/23 1845  ceFEPIme  (MAXIPIME ) 2 g in sodium chloride  0.9 % 100 mL IVPB        2 g 200 mL/hr over 30 Minutes Intravenous  Once 10/27/23 1843 10/27/23 2024   10/27/23 1845  vancomycin   (VANCOREADY) IVPB 2000 mg/400 mL        2,000 mg 200 mL/hr over 120 Minutes Intravenous  Once 10/27/23 1843 10/27/23 2123        Medications  Scheduled Meds:  amLODipine   10 mg Per Tube Daily   artificial tears   Both Eyes BID   Chlorhexidine  Gluconate Cloth  6 each Topical Daily   enoxaparin  (LOVENOX ) injection  40 mg Subcutaneous Daily   famotidine   20 mg Per Tube BID   feeding supplement (JEVITY 1.5 CAL/FIBER)  237 mL Per Tube 5 X Daily   feeding supplement (PROSource TF20)  60 mL Per Tube TID   free water   150 mL Per Tube Q6H   insulin  aspart  0-9 Units Subcutaneous Q6H   metoprolol  tartrate  75 mg Per Tube BID   multivitamin with minerals  1 tablet Per Tube Daily   mouth rinse  15 mL Mouth Rinse Q2H   polyethylene glycol  17 g Per Tube Daily   trimethoprim -polymyxin b   2 drop Left Eye QID   Continuous Infusions: PRN Meds:.acetaminophen  **OR** acetaminophen  (TYLENOL ) oral liquid 160 mg/5 mL **OR** acetaminophen , bisacodyl , Gerhardt's butt cream, hydrALAZINE , ipratropium-albuterol , mouth rinse, traZODone     Subjective:   Garrett [name] was seen and examined today.  No events overnight.   Objective:   Vitals:   05/23/24 1118 05/23/24 1200 05/23/24 1300 05/23/24 1400  BP:  122/89 121/89 (!) 129/90  Pulse:  78 76 78  Resp:  (!) 26 (!) 30 (!) 28  Temp: 98.6 F (37 C)     TempSrc: Axillary     SpO2:  94% 96% 95%  Weight:      Height:        Intake/Output Summary (Last 24 hours) at 05/23/2024 1454 Last data filed at 05/23/2024 1213 Gross per 24 hour  Intake 837 ml  Output 750 ml  Net 87 ml   Filed Weights   05/21/24 0500 05/22/24 0500 05/23/24 0500  Weight: 94 kg 94 kg 92 kg     Exam General exam: Appears calm and comfortable  Respiratory system: Clear to auscultation. Tachypnea s/p trach on collar connected to oxygen Cardiovascular system: S1 & S2 heard, RRR. ] Gastrointestinal system: Abdomen is soft BS+ Central nervous system: Unresponsive. Not  following commands.  Extremities: no edema.       Data Reviewed:  I have personally reviewed following labs and imaging studies   CBC Lab Results  Component Value Date   WBC 10.5 05/22/2024   RBC 3.92 (L) 05/22/2024   HGB 10.7 (L) 05/22/2024   HCT 35.0 (L) 05/22/2024   MCV 89.3 05/22/2024   MCH 27.3 05/22/2024   PLT 444 (H) 05/22/2024   MCHC 30.6 05/22/2024   RDW 16.3 (H) 05/22/2024   LYMPHSABS 1.9 05/22/2024   MONOABS 0.8 05/22/2024   EOSABS 0.1 05/22/2024   BASOSABS 0.0 05/22/2024  Last metabolic panel Lab Results  Component Value Date   NA 138 05/22/2024   K 4.5 05/22/2024   CL 99 05/22/2024   CO2 28 05/22/2024   BUN 22 (H) 05/22/2024   CREATININE 0.43 (L) 05/22/2024   GLUCOSE 134 (H) 05/22/2024   GFRNONAA >60 05/22/2024   GFRAA >60 01/15/2018   CALCIUM 9.0 05/22/2024   PHOS 4.0 05/11/2024   PROT 7.9 05/12/2024   ALBUMIN  3.0 (L) 05/12/2024   BILITOT 0.2 05/12/2024   ALKPHOS 65 05/12/2024   AST 50 (H) 05/12/2024   ALT 115 (H) 05/12/2024   ANIONGAP 11 05/22/2024    CBG (last 3)  Recent Labs    05/22/24 1754 05/22/24 1918 05/22/24 2327  GLUCAP 105* 147* 160*      Coagulation Profile: No results for input(s): INR, PROTIME in the last 168 hours.   Radiology Studies: No results found.     Elgie Butter M.D. [facility] 05/23/2024, 2:54 PM  Available via Epic secure chat 7am-7pm After 7 pm, please refer to night coverage provider listed on amion.

## 2024-05-23 NOTE — Progress Notes (Signed)
 Trach changed without any complications. Placement confirmed with end tidal C02 detector.

## 2024-05-24 DIAGNOSIS — Z930 Tracheostomy status: Secondary | ICD-10-CM | POA: Diagnosis not present

## 2024-05-24 DIAGNOSIS — I619 Nontraumatic intracerebral hemorrhage, unspecified: Secondary | ICD-10-CM | POA: Diagnosis not present

## 2024-05-24 DIAGNOSIS — J9611 Chronic respiratory failure with hypoxia: Secondary | ICD-10-CM | POA: Diagnosis not present

## 2024-05-24 DIAGNOSIS — Z789 Other specified health status: Secondary | ICD-10-CM | POA: Diagnosis not present

## 2024-05-24 MED ORDER — ORAL CARE MOUTH RINSE
15.0000 mL | OROMUCOSAL | Status: DC
  Administered 2024-05-24 – 2024-06-13 (×239): 15 mL via OROMUCOSAL

## 2024-05-24 MED ORDER — ORAL CARE MOUTH RINSE: 15.0000 mL | OROMUCOSAL | Status: DC | PRN

## 2024-05-24 NOTE — Progress Notes (Signed)
 Pt taken off the ventilator at this time and placed on aerosol trach collar without complication.      05/24/24 9187  Therapy Vitals  Pulse Rate 75  Resp (!) 24  BP 99/80  Patient Position (if appropriate) Lying  MEWS Score/Color  MEWS Score 2  MEWS Score Color Yellow  Respiratory Assessment  Assessment Type Assess only  Respiratory Pattern Regular;Unlabored;Symmetrical  Chest Assessment Chest expansion symmetrical  Cough Productive;Strong  Sputum Amount Moderate  Sputum Color Tan;White  Sputum Consistency Thick  Sputum Specimen Source Tracheostomy tube  Bilateral Breath Sounds Diminished;Rhonchi  Oxygen Therapy/Pulse Ox  SpO2 97 %  O2 Device (S)  Tracheostomy Collar  O2 Therapy Oxygen humidified  O2 Flow Rate (L/min) 8 L/min  FiO2 (%) 35 %  Tracheostomy Shiley Flexible 6 mm Cuffed  Placement Date/Time: 05/08/24 1630   Placed By: Self  Brand: Shiley Flexible  Size (mm): 6 mm  Style: Cuffed  Status Secured with trach ties  Site Assessment Clean;Dry  Site Care Cleansed;Dried;Dressing applied  Inner Cannula Care Changed/new  Ties Assessment Clean, Dry  Cuff Pressure (cm H2O)  (deflated)  Tracheostomy Equipment at bedside Yes and checklist posted at head of bed

## 2024-05-24 NOTE — Progress Notes (Signed)
 [facility]                                                                               Garrett [name], is a 48 y.o. male, DOB - 1976-06-24, FMW:[id number] Admit date - 10/27/2023    Outpatient Primary MD for the patient is Pcp, No  LOS - 210  days    Brief summary   48 y/o male with past medical history of hypertension was found down at home agonal breathing. History of uncontrolled hypertension but never had been to the doctor. Patient was noted to have a large (4.1 cm) hemorrhage centered in the pons with intraventricular extension into the fourth ventricle and also extension into the basal cisterns and trace additional subarachnoid hemorrhage along the left parietal convexity. BP in ED was 262/156. CXR revealed RUL and perihilar infiltrates suggesting aspiration pneumonia. ED labs also revealed PH 7.169, LA 4.4, CPK 985. Patient was intubated in ED and was admitted hospital.    Patient had prolonged complicated hospital stay pontine hemorrhage, biparietal SAH.  Patient's mental status poor, s/p tracheostomy and PEG placement.  Sputum cultures positive for MRSA on contact precautions.  Patient had prolonged difficulty weaning from vent.  Patient was on trach collar for few days but later required ventilatory support mostly at nights and is currently in ICU.   No events overnight. Currently waiting to hear from [facility]. Trach changed without any issues.   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 BP parameters are optimal today.  Poor chance of any meaning full recovery. But continue to monitor.    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  Not a candidate  for decannulation given ineffective airway clearance, -Continue trach collar during day with vent support at night. He is on 8 lit of oxygen via trach collar.  -Continue trach care per protocol. -Pulmonary follow up appreciated.  -  trach replaced on 05/23/24.   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 Waylan evaluated and placed tarsorraphy.   -8/29-ophtho rec: Erythromycin  eyedrop q2h, tape eyes with paper tape if not blinking. -9/14, ophtho, Dr. Leane reengaged due to worsening conjunctivitis and clouding recommended second antibiotic, gatifloxacin  4 times daily.per Optho, no epithelial defects noted. - 9/25: courses completed of abx drops; continue on opth ointment and cover left eye with patch and/or tape to keep lid closed  - 9/29, more injected with discharge again in OS, resume 5 day course of Polytrim  eyedrops to left eye - slowly improving.    Pseudomonal colonization -Trach culture aspirate obtained on 05/10/2024 due to SIRS criteria; fever defervesced without antibiotics and no increase in secretions or decline clinically  -Continue to evaluate any recurrent SIRS criteria in clinical context - afebrile, wbc count wnl.    Hypernatremia: Resolved. - Continue free water  at 150 cc every 6 hours. - Intermittently monitor labs - repeat sodium levels wnl.    Elevated liver enzymes: Mild.  Unclear etiology.  CK normal. - check intermittently.   Inadequate oral intake - PEG replaced by IR on 05/15/24 after becoming dislodged again Body mass index is 32.18 kg/m. Nutrition Problem: Inadequate oral intake Etiology: inability to eat Signs/Symptoms: NPO status Interventions: Tube feeding, Prostat, MVI, Juven    Malnutrition Type:  Nutrition Problem: Inadequate oral intake Etiology: inability to eat   Malnutrition Characteristics:  Signs/Symptoms: NPO status   Nutrition Interventions:  Interventions: Tube feeding, Prostat, MVI, Juven  Estimated body mass  index is 30.84 kg/m as calculated from the following:   Height as of this encounter: 5' 8 (1.727 m).   Weight as of this encounter: 92 kg.  Code Status: full code.  DVT Prophylaxis:  enoxaparin  (LOVENOX ) injection 40 mg Start: 03/06/24 1000 SCDs Start: 10/27/23 2226   Level of Care: Level of care: ICU Family Communication:none at bedside.   Disposition Plan:     Remains inpatient appropriate:  pending.      Consultants:   PCCM.  Ophthalmology.   Antimicrobials:   Anti-infectives (From admission, onward)    Start     Dose/Rate Route Frequency Ordered Stop   04/09/24 2200  linezolid  (ZYVOX ) tablet 600 mg  Status:  Discontinued        600 mg Per Tube Every 12 hours 04/09/24 1132 04/13/24 1332   04/09/24 1700  vancomycin  (VANCOREADY) IVPB 1500 mg/300 mL  Status:  Discontinued        1,500 mg 150 mL/hr over 120 Minutes Intravenous Every 12 hours 04/09/24 1108 04/09/24 1132   04/09/24 1000  linezolid  (ZYVOX ) IVPB 600 mg  Status:  Discontinued        600 mg 300 mL/hr over 60 Minutes Intravenous Every 12 hours 04/09/24 0527 04/09/24 1108   04/01/24 1145  ceFEPIme  (MAXIPIME ) 2 g in sodium chloride  0.9 % 100 mL IVPB        2 g 200 mL/hr over 30 Minutes Intravenous Every 8 hours 04/01/24 1137 04/14/24 2230   02/21/24 1000  vancomycin  (VANCOREADY) IVPB 1250 mg/250 mL        1,250 mg 166.7 mL/hr over 90 Minutes Intravenous Every 12 hours 02/20/24 2158 02/28/24 0130   02/16/24 1445  ceFEPIme  (MAXIPIME ) 2 g in sodium chloride  0.9 % 100 mL IVPB        2 g 200 mL/hr over 30 Minutes Intravenous Every 8 hours 02/16/24 1348 02/27/24 2322   02/16/24 0945  vancomycin  (VANCOREADY) IVPB 1500 mg/300 mL  Status:  Discontinued        1,500 mg 150 mL/hr over 120 Minutes Intravenous Every 12 hours 02/16/24 0848 02/20/24 2158   02/15/24 1215  Ampicillin -Sulbactam (UNASYN ) 3 g in sodium chloride  0.9 % 100 mL IVPB  Status:  Discontinued        3 g 200 mL/hr over 30 Minutes Intravenous Every 6 hours  02/15/24 1125 02/16/24 1226   01/24/24 2000  vancomycin  (VANCOREADY) IVPB 1250 mg/250 mL        1,250 mg 166.7 mL/hr over 90 Minutes Intravenous Every 24 hours 01/24/24 1154 01/27/24 0125   01/22/24 2200  Vancomycin  (VANCOCIN ) 1,250 mg in sodium chloride  0.9 % 250 mL IVPB  Status:  Discontinued        1,250 mg 166.7 mL/hr over 90 Minutes Intravenous Every 24 hours 01/22/24 1013 01/22/24 1020   01/22/24 2000  vancomycin  (VANCOREADY) IVPB 1250 mg/250 mL  Status:  Discontinued        1,250 mg 166.7 mL/hr over 90 Minutes Intravenous Every  24 hours 01/22/24 1020 01/24/24 1154   01/20/24 2000  ceFEPIme  (MAXIPIME ) 2 g in sodium chloride  0.9 % 100 mL IVPB  Status:  Discontinued        2 g 200 mL/hr over 30 Minutes Intravenous Every 8 hours 01/20/24 1900 01/24/24 1154   01/20/24 2000  Vancomycin  (VANCOCIN ) 1,250 mg in sodium chloride  0.9 % 250 mL IVPB  Status:  Discontinued        1,250 mg 166.7 mL/hr over 90 Minutes Intravenous Every 24 hours 01/20/24 1900 01/22/24 1013   01/03/24 1730  cefTRIAXone  (ROCEPHIN ) 1 g in sodium chloride  0.9 % 100 mL IVPB        1 g 200 mL/hr over 30 Minutes Intravenous Every 24 hours 01/03/24 1235 01/07/24 0710   01/02/24 1830  ceFEPIme  (MAXIPIME ) 2 g in sodium chloride  0.9 % 100 mL IVPB  Status:  Discontinued        2 g 200 mL/hr over 30 Minutes Intravenous Every 8 hours 01/02/24 1732 01/03/24 1235   01/02/24 1830  Vancomycin  (VANCOCIN ) 1,250 mg in sodium chloride  0.9 % 250 mL IVPB  Status:  Discontinued        1,250 mg 166.7 mL/hr over 90 Minutes Intravenous Every 24 hours 01/02/24 1732 01/04/24 1754   01/01/24 1200  cefTRIAXone  (ROCEPHIN ) 1 g in sodium chloride  0.9 % 100 mL IVPB  Status:  Discontinued        1 g 200 mL/hr over 30 Minutes Intravenous Every 24 hours 01/01/24 1057 01/02/24 1713   11/29/23 2200  linezolid  (ZYVOX ) tablet 600 mg        600 mg Per Tube Every 12 hours 11/29/23 1034 12/04/23 2215   11/28/23 1000  linezolid  (ZYVOX ) IVPB 600 mg  Status:   Discontinued        600 mg 300 mL/hr over 60 Minutes Intravenous Every 12 hours 11/28/23 0831 11/29/23 1034   11/23/23 1200  vancomycin  (VANCOREADY) IVPB 1250 mg/250 mL  Status:  Discontinued        1,250 mg 166.7 mL/hr over 90 Minutes Intravenous Every 24 hours 11/23/23 1036 11/28/23 0831   11/20/23 1100  vancomycin  (VANCOREADY) IVPB 1250 mg/250 mL  Status:  Discontinued        1,250 mg 166.7 mL/hr over 90 Minutes Intravenous Every 24 hours 11/20/23 0923 11/20/23 0923   11/20/23 1100  vancomycin  (VANCOREADY) IVPB 1250 mg/250 mL  Status:  Discontinued        1,250 mg 166.7 mL/hr over 90 Minutes Intravenous Every 24 hours 11/20/23 0923 11/23/23 1036   11/15/23 2300  vancomycin  (VANCOREADY) IVPB 1250 mg/250 mL        1,250 mg 166.7 mL/hr over 90 Minutes Intravenous Every 24 hours 11/15/23 2215 11/19/23 0924   11/12/23 2200  vancomycin  (VANCOREADY) IVPB 750 mg/150 mL  Status:  Discontinued        750 mg 150 mL/hr over 60 Minutes Intravenous Every 12 hours 11/12/23 0855 11/15/23 2215   11/12/23 0930  vancomycin  (VANCOREADY) IVPB 2000 mg/400 mL        2,000 mg 200 mL/hr over 120 Minutes Intravenous  Once 11/12/23 0838 11/12/23 1116   11/11/23 1345  Ampicillin -Sulbactam (UNASYN ) 3 g in sodium chloride  0.9 % 100 mL IVPB  Status:  Discontinued        3 g 200 mL/hr over 30 Minutes Intravenous Every 6 hours 11/11/23 1257 11/12/23 0836   10/28/23 0400  Ampicillin -Sulbactam (UNASYN ) 3 g in sodium chloride  0.9 % 100 mL IVPB  Status:  Discontinued        3 g 200 mL/hr over 30 Minutes Intravenous Every 6 hours 10/27/23 2151 11/02/23 0945   10/27/23 1845  vancomycin  (VANCOCIN ) IVPB 1000 mg/200 mL premix  Status:  Discontinued        1,000 mg 200 mL/hr over 60 Minutes Intravenous  Once 10/27/23 1843 10/27/23 1843   10/27/23 1845  ceFEPIme  (MAXIPIME ) 2 g in sodium chloride  0.9 % 100 mL IVPB        2 g 200 mL/hr over 30 Minutes Intravenous  Once 10/27/23 1843 10/27/23 2024   10/27/23 1845  vancomycin   (VANCOREADY) IVPB 2000 mg/400 mL        2,000 mg 200 mL/hr over 120 Minutes Intravenous  Once 10/27/23 1843 10/27/23 2123        Medications  Scheduled Meds:  amLODipine   10 mg Per Tube Daily   artificial tears   Both Eyes BID   Chlorhexidine  Gluconate Cloth  6 each Topical Daily   enoxaparin  (LOVENOX ) injection  40 mg Subcutaneous Daily   famotidine   20 mg Per Tube BID   feeding supplement (JEVITY 1.5 CAL/FIBER)  237 mL Per Tube 5 X Daily   feeding supplement (PROSource TF20)  60 mL Per Tube TID   free water   150 mL Per Tube Q6H   insulin  aspart  0-9 Units Subcutaneous Q6H   metoprolol  tartrate  75 mg Per Tube BID   multivitamin with minerals  1 tablet Per Tube Daily   mouth rinse  15 mL Mouth Rinse Q2H   polyethylene glycol  17 g Per Tube Daily   Continuous Infusions: PRN Meds:.acetaminophen  **OR** acetaminophen  (TYLENOL ) oral liquid 160 mg/5 mL **OR** acetaminophen , bisacodyl , Gerhardt's butt cream, hydrALAZINE , ipratropium-albuterol , mouth rinse, traZODone     Subjective:   Durwood Satterly was seen and examined today.  No events overnight.   Objective:   Vitals:   05/24/24 1146 05/24/24 1200 05/24/24 1300 05/24/24 1400  BP:  115/77 113/77 118/74  Pulse:  76 76 74  Resp:  (!) 26 (!) 29 (!) 24  Temp: 98.9 F (37.2 C)     TempSrc: Axillary     SpO2:  98% 96% 93%  Weight:      Height:        Intake/Output Summary (Last 24 hours) at 05/24/2024 1537 Last data filed at 05/24/2024 1254 Gross per 24 hour  Intake 600 ml  Output 950 ml  Net -350 ml   Filed Weights   05/22/24 0500 05/23/24 0500 05/24/24 0500  Weight: 94 kg 92 kg 92 kg     Exam General exam: Ill appearing gentleman with tape over the left eye.  Respiratory system: s/p tach on oxygen, with some tachypnea Cardiovascular system: S1 & S2 heard, RRR.  Gastrointestinal system: Abdomen is soft bs+ s/p PEG Central nervous system: unresponsive, vertical nystagmus in the right eye Extremities: no  edema.  Skin: No rashes,        Data Reviewed:  I have personally reviewed following labs and imaging studies   CBC Lab Results  Component Value Date   WBC 10.5 05/22/2024   RBC 3.92 (L) 05/22/2024   HGB 10.7 (L) 05/22/2024   HCT 35.0 (L) 05/22/2024   MCV 89.3 05/22/2024   MCH 27.3 05/22/2024   PLT 444 (H) 05/22/2024   MCHC 30.6 05/22/2024   RDW 16.3 (H) 05/22/2024   LYMPHSABS 1.9 05/22/2024   MONOABS 0.8 05/22/2024   EOSABS 0.1 05/22/2024   BASOSABS 0.0 05/22/2024  Last metabolic panel Lab Results  Component Value Date   NA 138 05/22/2024   K 4.5 05/22/2024   CL 99 05/22/2024   CO2 28 05/22/2024   BUN 22 (H) 05/22/2024   CREATININE 0.43 (L) 05/22/2024   GLUCOSE 134 (H) 05/22/2024   GFRNONAA >60 05/22/2024   GFRAA >60 01/15/2018   CALCIUM 9.0 05/22/2024   PHOS 4.0 05/11/2024   PROT 7.9 05/12/2024   ALBUMIN  3.0 (L) 05/12/2024   BILITOT 0.2 05/12/2024   ALKPHOS 65 05/12/2024   AST 50 (H) 05/12/2024   ALT 115 (H) 05/12/2024   ANIONGAP 11 05/22/2024    CBG (last 3)  Recent Labs    05/22/24 2327 05/23/24 1856 05/23/24 2316  GLUCAP 160* 130* 130*      Coagulation Profile: No results for input(s): INR, PROTIME in the last 168 hours.   Radiology Studies: No results found.     Elgie Butter M.D. [facility] 05/24/2024, 3:37 PM  Available via Epic secure chat 7am-7pm After 7 pm, please refer to night coverage provider listed on amion.

## 2024-05-25 ENCOUNTER — Inpatient Hospital Stay (HOSPITAL_COMMUNITY)

## 2024-05-25 DIAGNOSIS — I619 Nontraumatic intracerebral hemorrhage, unspecified: Secondary | ICD-10-CM | POA: Diagnosis not present

## 2024-05-25 DIAGNOSIS — Z930 Tracheostomy status: Secondary | ICD-10-CM | POA: Diagnosis not present

## 2024-05-25 DIAGNOSIS — J9622 Acute and chronic respiratory failure with hypercapnia: Secondary | ICD-10-CM | POA: Diagnosis not present

## 2024-05-25 DIAGNOSIS — J9611 Chronic respiratory failure with hypoxia: Secondary | ICD-10-CM | POA: Diagnosis not present

## 2024-05-25 DIAGNOSIS — Z789 Other specified health status: Secondary | ICD-10-CM | POA: Diagnosis not present

## 2024-05-25 DIAGNOSIS — J9621 Acute and chronic respiratory failure with hypoxia: Secondary | ICD-10-CM | POA: Diagnosis not present

## 2024-05-25 NOTE — Progress Notes (Signed)
 Pt with increased work of breathing and tachypnea despite suctioning. Pt returned to full support vent settings and cuff reinflated at this time without complication. RT will continue to monitor and be available as needed.      05/25/24 1120  Vent Select  Invasive or Noninvasive Invasive  Adult Vent Y  Tracheostomy Shiley Flexible 6 mm Cuffed  Placement Date/Time: 05/23/24 1042   Placed By: Other (Comment)  Brand: Shiley Flexible  Size (mm): 6 mm  Style: Cuffed  Status Secured with trach ties  Site Assessment Clean;Dry  Site Care Cleansed;Dried  Ties Assessment Clean, Dry  Cuff Pressure (cm H2O) Clear OR 27-39 CmH2O  Tracheostomy Equipment at bedside Yes and checklist posted at head of bed  Adult Ventilator Settings  Vent Type Servo i  Humidity HME  Vent Mode PRVC  Vt Set 550 mL  Set Rate 12 bmp  FiO2 (%) 40 %  I Time 0.9 Sec(s)  PEEP 5 cmH20  Adult Ventilator Measurements  Peak Airway Pressure 37 L/min  Mean Airway Pressure 11 cmH20  Plateau Pressure 19 cmH20  Resp Rate Spontaneous 4 br/min  Resp Rate Total 16 br/min  Exhaled Vt 638 mL  Spont TV 546 mL  Measured Ve 8.2 L  I:E Ratio Measured 1:3.1  Auto PEEP 0 cmH20  Total PEEP 5 cmH20  Adult Ventilator Alarms  Alarms On Y  Ve High Alarm 20 L/min  Ve Low Alarm 4 L/min  Resp Rate High Alarm 38 br/min  Resp Rate Low Alarm 8  PEEP Low Alarm 3 cmH2O  Press High Alarm 45 cmH2O  T Apnea 20 sec(s)  VAP Prevention  HOB> 30 Degrees Y  Daily Weaning Assessment  Reason SBT Terminated Excessive agitation, marked accessory muscle use, abdominal paradox, or diaphoresis noted  Breath Sounds  Bilateral Breath Sounds Diminished;Rhonchi  Vent Respiratory Assessment  Level of Consciousness Alert  Respiratory Pattern Labored;Accessory muscle use;Symmetrical;Tachypnea  Airway Suctioning/Secretions  Suction Type Tracheal  Suction Device  Catheter  Secretion Amount Moderate  Secretion Color White;Tan  Secretion Consistency Thick   Suction Tolerance Tolerated well  Suctioning Adverse Effects None

## 2024-05-25 NOTE — Progress Notes (Addendum)
 NAME:  Garrett [name], MRN:  [id number], DOB:  1975/12/06, LOS: 211 ADMISSION DATE:  10/27/2023, CONSULTATION DATE:  10/27/2023 REFERRING MD: Regalado - TR[facility], CHIEF COMPLAINT:  Found down   History of Present Illness:  48 y/o man who was found down at home. PMHx significant for HTN.  Patient was down for an unknown amount of time, found by his fiance when she came home from work, reportedly with agonal breathing. Patient had driven her to work the morning of admission.  He has HTN but never checks his BP and does not follow with MD. Patient's fiance states that she can tell when his BP is really high; his eyes get blood shot and he is sweating. No h/o seizures. Besides almost daily marijuana and cigarette smoking, she is not aware of any other drug use. Drug screen positive for opioids and he was given several doses of Narcan . 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 BP in ED 262/156. CXR revealed RUL and perihilar infiltrates suggesting aspiration pneumonia. ED labs also revealed PH 7.169, LA 4.4, CPK 985. Patient was intubated in ED.  PCCM consulted.  Pertinent Medical History:  Hypertension  Significant Hospital Events: Including procedures, antibiotic start and stop dates in addition to other pertinent events   3/08 - Intubated in ED, pontine bleed/SAH. CT head 17:09 Acute large hemorrhage 4.1 cm in pons with intraventricular extension without hydrocephalus 3/09 - CTA 03:14 AM No change in IPH in pons, similar biparietal [facility] 3/14 - Now awake, appears locked in can follow commands with eyes. 3/16 - Purposeful with left upper extremity  3/22 - Tracheostomy placed at bedside, bleeding issues overnight from trach site 3/24 - PEG (Dr. Sebastian) 3/24 - Sputum culture with MRSA 3/27  - Vomited. TF on hold. Abd film c/w ileus. Added reglan , got SSE. Did tolerate PSV all day  3/28 - BMs x2 after SSE day prior. Added back TFs at 1/2 rated. Tolerated PS 3/29 - Tolerated PS  3/31 - Tolerating CPAP PS 15/5, TF on hold due to ileus. 4/01 - Con't to hold tube feeds today, restarting vancomycin  and sending tracheal aspirate as peaks are 37, plat 24, driving 19. Thick secretions. Fever overnight.  4/02 - Peaks improved. Ongoing hiccups. Trickle feeding. Neuro exam unchanged.  4/20 - Trach was changed to cuffless #8 4/28 - Not safe to swallow. Limited communication and he follows simple commands. On TF 5/01 - On TC 28%, with large secretions. Working with speech and he is improving to initiate PMV trials under ST supervision   5/05 - On TC 30%, 7 L/min. Trach #6 cuffless, changed 5/1 to facilitate PMV trials  5/12 - On TC 21%, 6 L/min. Trach #6 cuffless, unable to produce sounds or speak on PMV. No significant resp secretions. On PEG TF 6/10 - Tracheostomy was removed due to failure to well provide adequate airway 02/15/2024 pulmonary critical care consult. 7/07 Patient obtunded and desaturating on NRB; transfer to icu for intubation; updated mother over phone who wants to reverse code status to full code; ent consulted 7/09 Remains intubated on no continuous sedation, right eye open but does not track movement  7/16 Revision ENT trach with [facility] Flap 7/18 Weaned on vent 5 hours  7/19 Tolerated trach collar for the majority of the day but had some increased work of breath  resulting in being placed back on vent support  7/20 No issues overnight, back on ATC this AM 7/21 Did not tolerate ATC. Placed back on PRVC. 7/24 ATC until afternoon and went on vent for WOB reportedly  7/25 ATC for a few hours  7/26 ATC x 6h 7/28 tolerating trach collar-tolerated for most of the day 7/30 on vent overnight and back on trach collar today 8/1 back on vent overnight  8/2 was able to maintain off of  ventilator overnight but by 6 AM today patient was seen with signs of increased work of breathing including tachycardia therefore patient was placed back on ventilator 8/3 remained on ventilator overnight to transition to trach collar this a.m. 8/4 did 18 hour ATC yesterday; back on vent 3am today 8/5 TC for 12-14 hours. Then went back to vent because of tachypnea.  8/6 tolerated ATC at 30% for 24 hours. Back on vent this AM for rest as was a bit tachypneic 8/11 been off vent >24 hours. 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  8/12-off vent for over 48 hours 8/18 completed abx  8/19 Changed to cuffless trach 8/20 Increasing tachypnea, hypercarbia.  Trach changed back to #6 cuffed and brought back to ICU to be placed on ventilator.  Antibiotics broadened to linezolid  and cefepime  8/23 PSV wean 8/24 eeg for twitching no sz 8/26 started TC 8/27 did TC overnight, hypercarbic on repeat gas but compensated 9/2 periods of apnea but overall doing ok ATC day vent night 9/8 continuing noct vent Atc day  9/22 Tolerating ATC during day and nocturnal ventilation  Interim History / Subjective:  Asked to stop by and see patient Increased work of breathing, rhonchi  Objective:   Blood pressure (!) 140/109, pulse (!) 105, temperature 98.8 F (37.1 C), temperature source Axillary, resp. rate (!) 34, height 5' 8 (1.727 m), weight 92 kg, SpO2 96%.    Vent Mode: PRVC FiO2 (%):  [30 %-40 %] 30 % Set Rate:  [12 bmp] 12 bmp Vt Set:  [550 mL] 550 mL PEEP:  [5 cmH20] 5 cmH20 Plateau Pressure:  [15 cmH20-16 cmH20] 16 cmH20   Intake/Output Summary (Last 24 hours) at 05/25/2024 1118 Last data filed at 05/25/2024 1103 Gross per 24 hour  Intake 987 ml  Output 1100 ml  Net -113 ml   Filed Weights   05/23/24 0500 05/24/24 0500 05/25/24 0500  Weight: 92 kg 92 kg 92 kg   Physical Exam: General: Chronically ill-appearing HENT: Tape over left eye Neck:  Cuffed trach in place Eyes: No scleral icterus Respiratory: Rhonchi cardiovascular: S1-S2 appreciated GI: Bowel sounds appreciated Extremities:-No edema, no clubbing Neuro: Eyes open, upward tracking, does not follow commands, chronic right facial twitching GU: Foley in place  Lab data reviewed  Resolved Problem List:  Post sedation hypotension Left eye conjunctivitis - resolved  Hyperglycemia, resolved Pseudomonas and E. 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 Not a candidate for decannulation given ineffective airway clearance -course since then has been c/b recurrent HCAP, variable vent need-- at times having decompensations req 24hr vent.  -most recently he has had noct hypercarbia and its felt that he needs noct vent. He continues to demonstrate hypercarbia after prolonged ATC trials  Plan:  Not a candidate for decannulation - Continue trach collar during the day and vent at night, will give him a rest with a ventilator during the day today Continue mandatory ventilator support at night - Routine trach care Nebulization treatments as needed VAP bundle in place  Will follow on a weekly basis   Best Practice  (right click and Reselect all SmartList Selections daily)   Jennet Epley, MD [place] PCCM Pager: See Amion  Addendum: Chest x-ray reviewed showing atelectasis at right base, overall x-ray looks improved compared to previous

## 2024-05-25 NOTE — Progress Notes (Signed)
 Pt taken off the ventilator at this time and placed on aerosol trach collar without complication. Pt cuff delfated at this time. RT will continue to monitor and be available as needed.      05/25/24 0748  Therapy Vitals  Temp 98.8 F (37.1 C)  Temp Source Axillary  Pulse Rate 84  Resp 18  BP (!) 117/94  Patient Position (if appropriate) Lying  Respiratory Assessment  Assessment Type Assess only  Respiratory Pattern Regular;Unlabored;Symmetrical  Chest Assessment Chest expansion symmetrical  Cough Congested;Productive;Strong  Sputum Amount Small  Sputum Color Tan;Pink tinged  Sputum Consistency Thick  Sputum Specimen Source Tracheostomy tube  Bilateral Breath Sounds Diminished;Rhonchi  Oxygen Therapy/Pulse Ox  O2 Device (S)  Tracheostomy Collar  O2 Therapy Oxygen humidified  O2 Flow Rate (L/min) (S)  8 L/min  FiO2 (%) (S)  30 %  Tracheostomy Shiley Flexible 6 mm Cuffed  Placement Date/Time: 05/23/24 1042   Placed By: Other (Comment)  Brand: Shiley Flexible  Size (mm): 6 mm  Style: Cuffed  Status Secured with trach ties  Site Assessment Clean;Dry  Site Care Cleansed;Dried;Dressing applied  Inner Cannula Care Changed/new  Ties Assessment Clean, Dry  Cuff Pressure (cm H2O)  (deflated)  Tracheostomy Equipment at bedside Yes and checklist posted at head of bed

## 2024-05-26 DIAGNOSIS — J9611 Chronic respiratory failure with hypoxia: Secondary | ICD-10-CM | POA: Diagnosis not present

## 2024-05-26 DIAGNOSIS — I619 Nontraumatic intracerebral hemorrhage, unspecified: Secondary | ICD-10-CM | POA: Diagnosis not present

## 2024-05-26 DIAGNOSIS — Z789 Other specified health status: Secondary | ICD-10-CM | POA: Diagnosis not present

## 2024-05-26 DIAGNOSIS — Z930 Tracheostomy status: Secondary | ICD-10-CM | POA: Diagnosis not present

## 2024-05-26 LAB — GLUCOSE, CAPILLARY
Glucose-Capillary: 101 mg/dL — ABNORMAL HIGH (ref 70–99)
Glucose-Capillary: 120 mg/dL — ABNORMAL HIGH (ref 70–99)
Glucose-Capillary: 122 mg/dL — ABNORMAL HIGH (ref 70–99)
Glucose-Capillary: 123 mg/dL — ABNORMAL HIGH (ref 70–99)
Glucose-Capillary: 128 mg/dL — ABNORMAL HIGH (ref 70–99)
Glucose-Capillary: 135 mg/dL — ABNORMAL HIGH (ref 70–99)
Glucose-Capillary: 140 mg/dL — ABNORMAL HIGH (ref 70–99)
Glucose-Capillary: 143 mg/dL — ABNORMAL HIGH (ref 70–99)
Glucose-Capillary: 147 mg/dL — ABNORMAL HIGH (ref 70–99)
Glucose-Capillary: 93 mg/dL (ref 70–99)
Glucose-Capillary: 95 mg/dL (ref 70–99)
Glucose-Capillary: 97 mg/dL (ref 70–99)
Glucose-Capillary: 116 mg/dL — ABNORMAL HIGH (ref 70–99)

## 2024-05-26 MED ORDER — MUPIROCIN 2 % EX OINT
1.0000 | TOPICAL_OINTMENT | Freq: Two times a day (BID) | CUTANEOUS | Status: AC
  Administered 2024-05-26 – 2024-05-30 (×10): 1 via NASAL
  Filled 2024-05-26 (×3): qty 22

## 2024-05-26 NOTE — TOC Progression Note (Signed)
 Transition of Care [facility]) - Progression Note    Patient Details  Name: Garrett [name] MRN: [id number] Date of Birth: [birth year]  Transition of Care [facility]) CM/SW Contact  Lendia Dais, [place] Phone Number: 05/26/2024, 12:02 PM  Clinical Narrative:  CSW spoke to Angie of [facility] and stated that the pt does not have LTC medicaid. CSW spoke to pt's financial navigator and stated the the pt's insurance has been updated to Medicaid of [place] ([place]TC/disability medicaid). CSW informed Angie and after receiving the pt's Member ID number, Angie stated that she will f/u to confirm coverage.  CSW will continue to monitor.     Expected Discharge Plan: Long Term Nursing Home Barriers to Discharge: Continued Medical Work up, SNF Pending Medicaid, SNF Pending bed offer, SNF Pending payor source - LOG, Inadequate or no insurance (New trach)               Expected Discharge Plan and Services In-house Referral: Clinical Social Work, Hospice / Palliative Care   Post Acute Care Choice: Skilled Nursing Facility Living arrangements for the past 2 months: Single Family Home                                       Social Drivers of Health (SDOH) Interventions SDOH Screenings   Food Insecurity: Patient Unable To Answer (10/29/2023)  Social Connections: Unknown (06/23/2023)   Received from [facility]  Tobacco Use: High Risk (10/31/2023)    Readmission Risk Interventions     No data to display

## 2024-05-26 NOTE — Progress Notes (Signed)
 [facility]                                                                               Garrett [name], is a 48 y.o. male, DOB - 01-26-1976, FMW:[id number] Admit date - 10/27/2023    Outpatient Primary MD for the patient is Pcp, No  LOS - 212  days    Brief summary   48 y/o male with past medical history of hypertension was found down at home agonal breathing. History of uncontrolled hypertension but never had been to the doctor. Patient was noted to have a large (4.1 cm) hemorrhage centered in the pons with intraventricular extension into the fourth ventricle and also extension into the basal cisterns and trace additional subarachnoid hemorrhage along the left parietal convexity. BP in ED was 262/156. CXR revealed RUL and perihilar infiltrates suggesting aspiration pneumonia. ED labs also revealed PH 7.169, LA 4.4, CPK 985. Patient was intubated in ED and was admitted hospital.    Patient had prolonged complicated hospital stay pontine hemorrhage, biparietal SAH.  Patient's mental status poor, s/p tracheostomy and PEG placement.  Sputum cultures positive for MRSA on contact precautions.  Patient had prolonged difficulty weaning from vent.  Patient was on trach collar for few days but later required ventilatory support mostly at nights and is currently in ICU.   No events overnight. Currently waiting to hear from [facility]. Trach changed without any issues.   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 BP parameters are optimal.  Poor chance of any meaning full recovery. But continue to monitor.    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  Not a candidate for  decannulation given ineffective airway clearance, -Continue trach collar during day with vent support at night. He is on 8 lit of oxygen via trach collar.  -Continue trach care per protocol. -Pulmonary follow up appreciated.  -  trach replaced on 05/23/24. - patient had respiratory distress and awas placed on vent this afternoon.  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 Waylan evaluated and placed tarsorraphy.   -8/29-ophtho rec: Erythromycin  eyedrop q2h, tape eyes with paper tape if not blinking. -9/14, ophtho, Dr. Leane reengaged due to worsening conjunctivitis and clouding recommended second antibiotic, gatifloxacin  4 times daily.per Optho, no epithelial defects noted. - 9/25: courses completed of abx drops; continue on opth ointment and cover left eye with patch and/or tape to keep lid closed  - 9/29, more injected with discharge again in OS, resume 5 day course of Polytrim  eyedrops to left eye - slowly improving.    Pseudomonal colonization -Trach culture aspirate obtained on 05/10/2024 due to SIRS criteria; fever defervesced without antibiotics and no increase in secretions or decline clinically  -Continue to evaluate any recurrent SIRS criteria in clinical context - afebrile, wbc count wnl.    Hypernatremia: Resolved. - Continue free  water  at 150 cc every 6 hours. - Intermittently monitor labs - repeat sodium levels wnl.    Elevated liver enzymes: Mild.  Unclear etiology.  CK normal. - check intermittently.   Inadequate oral intake - PEG replaced by IR on 05/15/24 after becoming dislodged again Body mass index is 32.18 kg/m. Nutrition Problem: Inadequate oral intake Etiology: inability to eat Signs/Symptoms: NPO status Interventions: Tube feeding, Prostat, MVI, Juven    Malnutrition Type:  Nutrition Problem: Inadequate oral intake Etiology: inability to eat   Malnutrition  Characteristics:  Signs/Symptoms: NPO status   Nutrition Interventions:  Interventions: Tube feeding, Prostat, MVI, Juven  Estimated body mass index is 30.84 kg/m as calculated from the following:   Height as of this encounter: 5' 8 (1.727 m).   Weight as of this encounter: 92 kg.  Code Status: full code.  DVT Prophylaxis:  enoxaparin  (LOVENOX ) injection 40 mg Start: 03/06/24 1000 SCDs Start: 10/27/23 2226   Level of Care: Level of care: ICU Family Communication:none at bedside.   Disposition Plan:     Remains inpatient appropriate:  pending.      Consultants:   PCCM.  Ophthalmology.   Antimicrobials:   Anti-infectives (From admission, onward)    Start     Dose/Rate Route Frequency Ordered Stop   04/09/24 2200  linezolid  (ZYVOX ) tablet 600 mg  Status:  Discontinued        600 mg Per Tube Every 12 hours 04/09/24 1132 04/13/24 1332   04/09/24 1700  vancomycin  (VANCOREADY) IVPB 1500 mg/300 mL  Status:  Discontinued        1,500 mg 150 mL/hr over 120 Minutes Intravenous Every 12 hours 04/09/24 1108 04/09/24 1132   04/09/24 1000  linezolid  (ZYVOX ) IVPB 600 mg  Status:  Discontinued        600 mg 300 mL/hr over 60 Minutes Intravenous Every 12 hours 04/09/24 0527 04/09/24 1108   04/01/24 1145  ceFEPIme  (MAXIPIME ) 2 g in sodium chloride  0.9 % 100 mL IVPB        2 g 200 mL/hr over 30 Minutes Intravenous Every 8 hours 04/01/24 1137 04/14/24 2230   02/21/24 1000  vancomycin  (VANCOREADY) IVPB 1250 mg/250 mL        1,250 mg 166.7 mL/hr over 90 Minutes Intravenous Every 12 hours 02/20/24 2158 02/28/24 0130   02/16/24 1445  ceFEPIme  (MAXIPIME ) 2 g in sodium chloride  0.9 % 100 mL IVPB        2 g 200 mL/hr over 30 Minutes Intravenous Every 8 hours 02/16/24 1348 02/27/24 2322   02/16/24 0945  vancomycin  (VANCOREADY) IVPB 1500 mg/300 mL  Status:  Discontinued        1,500 mg 150 mL/hr over 120 Minutes Intravenous Every 12 hours 02/16/24 0848 02/20/24 2158   02/15/24 1215   Ampicillin -Sulbactam (UNASYN ) 3 g in sodium chloride  0.9 % 100 mL IVPB  Status:  Discontinued        3 g 200 mL/hr over 30 Minutes Intravenous Every 6 hours 02/15/24 1125 02/16/24 1226   01/24/24 2000  vancomycin  (VANCOREADY) IVPB 1250 mg/250 mL        1,250 mg 166.7 mL/hr over 90 Minutes Intravenous Every 24 hours 01/24/24 1154 01/27/24 0125   01/22/24 2200  Vancomycin  (VANCOCIN ) 1,250 mg in sodium chloride  0.9 % 250 mL IVPB  Status:  Discontinued        1,250 mg 166.7 mL/hr over 90 Minutes Intravenous Every 24 hours 01/22/24 1013 01/22/24 1020   01/22/24 2000  vancomycin  (VANCOREADY)  IVPB 1250 mg/250 mL  Status:  Discontinued        1,250 mg 166.7 mL/hr over 90 Minutes Intravenous Every 24 hours 01/22/24 1020 01/24/24 1154   01/20/24 2000  ceFEPIme  (MAXIPIME ) 2 g in sodium chloride  0.9 % 100 mL IVPB  Status:  Discontinued        2 g 200 mL/hr over 30 Minutes Intravenous Every 8 hours 01/20/24 1900 01/24/24 1154   01/20/24 2000  Vancomycin  (VANCOCIN ) 1,250 mg in sodium chloride  0.9 % 250 mL IVPB  Status:  Discontinued        1,250 mg 166.7 mL/hr over 90 Minutes Intravenous Every 24 hours 01/20/24 1900 01/22/24 1013   01/03/24 1730  cefTRIAXone  (ROCEPHIN ) 1 g in sodium chloride  0.9 % 100 mL IVPB        1 g 200 mL/hr over 30 Minutes Intravenous Every 24 hours 01/03/24 1235 01/07/24 0710   01/02/24 1830  ceFEPIme  (MAXIPIME ) 2 g in sodium chloride  0.9 % 100 mL IVPB  Status:  Discontinued        2 g 200 mL/hr over 30 Minutes Intravenous Every 8 hours 01/02/24 1732 01/03/24 1235   01/02/24 1830  Vancomycin  (VANCOCIN ) 1,250 mg in sodium chloride  0.9 % 250 mL IVPB  Status:  Discontinued        1,250 mg 166.7 mL/hr over 90 Minutes Intravenous Every 24 hours 01/02/24 1732 01/04/24 1754   01/01/24 1200  cefTRIAXone  (ROCEPHIN ) 1 g in sodium chloride  0.9 % 100 mL IVPB  Status:  Discontinued        1 g 200 mL/hr over 30 Minutes Intravenous Every 24 hours 01/01/24 1057 01/02/24 1713   11/29/23 2200   linezolid  (ZYVOX ) tablet 600 mg        600 mg Per Tube Every 12 hours 11/29/23 1034 12/04/23 2215   11/28/23 1000  linezolid  (ZYVOX ) IVPB 600 mg  Status:  Discontinued        600 mg 300 mL/hr over 60 Minutes Intravenous Every 12 hours 11/28/23 0831 11/29/23 1034   11/23/23 1200  vancomycin  (VANCOREADY) IVPB 1250 mg/250 mL  Status:  Discontinued        1,250 mg 166.7 mL/hr over 90 Minutes Intravenous Every 24 hours 11/23/23 1036 11/28/23 0831   11/20/23 1100  vancomycin  (VANCOREADY) IVPB 1250 mg/250 mL  Status:  Discontinued        1,250 mg 166.7 mL/hr over 90 Minutes Intravenous Every 24 hours 11/20/23 0923 11/20/23 0923   11/20/23 1100  vancomycin  (VANCOREADY) IVPB 1250 mg/250 mL  Status:  Discontinued        1,250 mg 166.7 mL/hr over 90 Minutes Intravenous Every 24 hours 11/20/23 0923 11/23/23 1036   11/15/23 2300  vancomycin  (VANCOREADY) IVPB 1250 mg/250 mL        1,250 mg 166.7 mL/hr over 90 Minutes Intravenous Every 24 hours 11/15/23 2215 11/19/23 0924   11/12/23 2200  vancomycin  (VANCOREADY) IVPB 750 mg/150 mL  Status:  Discontinued        750 mg 150 mL/hr over 60 Minutes Intravenous Every 12 hours 11/12/23 0855 11/15/23 2215   11/12/23 0930  vancomycin  (VANCOREADY) IVPB 2000 mg/400 mL        2,000 mg 200 mL/hr over 120 Minutes Intravenous  Once 11/12/23 0838 11/12/23 1116   11/11/23 1345  Ampicillin -Sulbactam (UNASYN ) 3 g in sodium chloride  0.9 % 100 mL IVPB  Status:  Discontinued        3 g 200 mL/hr over 30 Minutes Intravenous Every 6  hours 11/11/23 1257 11/12/23 0836   10/28/23 0400  Ampicillin -Sulbactam (UNASYN ) 3 g in sodium chloride  0.9 % 100 mL IVPB  Status:  Discontinued        3 g 200 mL/hr over 30 Minutes Intravenous Every 6 hours 10/27/23 2151 11/02/23 0945   10/27/23 1845  vancomycin  (VANCOCIN ) IVPB 1000 mg/200 mL premix  Status:  Discontinued        1,000 mg 200 mL/hr over 60 Minutes Intravenous  Once 10/27/23 1843 10/27/23 1843   10/27/23 1845  ceFEPIme  (MAXIPIME )  2 g in sodium chloride  0.9 % 100 mL IVPB        2 g 200 mL/hr over 30 Minutes Intravenous  Once 10/27/23 1843 10/27/23 2024   10/27/23 1845  vancomycin  (VANCOREADY) IVPB 2000 mg/400 mL        2,000 mg 200 mL/hr over 120 Minutes Intravenous  Once 10/27/23 1843 10/27/23 2123        Medications  Scheduled Meds:  amLODipine   10 mg Per Tube Daily   artificial tears   Both Eyes BID   Chlorhexidine  Gluconate Cloth  6 each Topical Daily   enoxaparin  (LOVENOX ) injection  40 mg Subcutaneous Daily   famotidine   20 mg Per Tube BID   feeding supplement (JEVITY 1.5 CAL/FIBER)  237 mL Per Tube 5 X Daily   feeding supplement (PROSource TF20)  60 mL Per Tube TID   free water   150 mL Per Tube Q6H   insulin  aspart  0-9 Units Subcutaneous Q6H   metoprolol  tartrate  75 mg Per Tube BID   multivitamin with minerals  1 tablet Per Tube Daily   mupirocin  ointment  1 Application Nasal BID   mouth rinse  15 mL Mouth Rinse Q2H   polyethylene glycol  17 g Per Tube Daily   Continuous Infusions: PRN Meds:.acetaminophen  **OR** acetaminophen  (TYLENOL ) oral liquid 160 mg/5 mL **OR** acetaminophen , bisacodyl , Gerhardt's butt cream, hydrALAZINE , ipratropium-albuterol , mouth rinse, traZODone     Subjective:   Garrett [name] was seen and examined today.  Patient was tachypneic and needed to be on vent during the day.   Objective:   Vitals:   05/26/24 1200 05/26/24 1300 05/26/24 1400 05/26/24 1528  BP: 125/86 127/89 (!) 136/91   Pulse: 79 81 87   Resp: (!) 28 (!) 24 (!) 23   Temp:    98.6 F (37 C)  TempSrc:      SpO2: 92% 93% (!) 87%   Weight:      Height:        Intake/Output Summary (Last 24 hours) at 05/26/2024 1625 Last data filed at 05/26/2024 1420 Gross per 24 hour  Intake 1311 ml  Output 1250 ml  Net 61 ml   Filed Weights   05/24/24 0500 05/25/24 0500 05/26/24 0500  Weight: 92 kg 92 kg 92 kg     Exam General exam: Appears calm and comfortable  Respiratory system: diminished at  bases, tachypnea, hypoxic.  Cardiovascular system: S1 & S2 heard, RRR. No JVD,  Gastrointestinal system: Abdomen is soft, bs+, s/p PEG  Central nervous system: opening eyes to tactile.  Extremities: no edema.  Skin: No rashes,        Data Reviewed:  I have personally reviewed following labs and imaging studies   CBC Lab Results  Component Value Date   WBC 10.5 05/22/2024   RBC 3.92 (L) 05/22/2024   HGB 10.7 (L) 05/22/2024   HCT 35.0 (L) 05/22/2024   MCV 89.3 05/22/2024   MCH 27.3  05/22/2024   PLT 444 (H) 05/22/2024   MCHC 30.6 05/22/2024   RDW 16.3 (H) 05/22/2024   LYMPHSABS 1.9 05/22/2024   MONOABS 0.8 05/22/2024   EOSABS 0.1 05/22/2024   BASOSABS 0.0 05/22/2024     Last metabolic panel Lab Results  Component Value Date   NA 138 05/22/2024   K 4.5 05/22/2024   CL 99 05/22/2024   CO2 28 05/22/2024   BUN 22 (H) 05/22/2024   CREATININE 0.43 (L) 05/22/2024   GLUCOSE 134 (H) 05/22/2024   GFRNONAA >60 05/22/2024   GFRAA >60 01/15/2018   CALCIUM 9.0 05/22/2024   PHOS 4.0 05/11/2024   PROT 7.9 05/12/2024   ALBUMIN  3.0 (L) 05/12/2024   BILITOT 0.2 05/12/2024   ALKPHOS 65 05/12/2024   AST 50 (H) 05/12/2024   ALT 115 (H) 05/12/2024   ANIONGAP 11 05/22/2024    CBG (last 3)  Recent Labs    05/26/24 0036 05/26/24 0512 05/26/24 1112  GLUCAP 101* 95 143*      Coagulation Profile: No results for input(s): INR, PROTIME in the last 168 hours.   Radiology Studies: DG CHEST PORT 1 VIEW Result Date: 05/25/2024 EXAM: 1 VIEW XRAY OF THE CHEST 05/25/2024 12:36:00 PM COMPARISON: Prior radiograph dated 05/10/2024. CLINICAL HISTORY: SOB (shortness of breath). FINDINGS: LINES, TUBES AND DEVICES: Tracheostomy above the carina in similar position. LUNGS AND PLEURA: Shallow inspiration. Right lung base streaky atelectasis improved since the prior radiograph. Pneumonia is not excluded. No pulmonary edema. No pleural effusion. No pneumothorax. HEART AND MEDIASTINUM:  Stable mild cardiomegaly. No acute abnormality of the mediastinal silhouette. BONES AND SOFT TISSUES: No acute osseous abnormality. IMPRESSION: 1. Right lung base streaky atelectasis, improved; pneumonia not excluded. Electronically signed by: Vanetta Chou MD 05/25/2024 02:21 PM EDT RP Workstation: [id number]       Elgie Butter M.D. [facility] 05/26/2024, 4:25 PM  Available via Epic secure chat 7am-7pm After 7 pm, please refer to night coverage provider listed on amion.

## 2024-05-26 NOTE — Progress Notes (Signed)
 Pt cuff deflated at this time and taken off the ventilator without complication. Pt placed on aerosol trach collar and trach care provided. RT will continue to monitor and be available as needed.    05/26/24 0805  Therapy Vitals  Pulse Rate 85  Resp (!) 24  BP 108/83  Patient Position (if appropriate) Lying  Respiratory Assessment  Assessment Type Assess only  Respiratory Pattern Regular;Unlabored;Symmetrical  Chest Assessment Chest expansion symmetrical  Cough Productive;Congested;Strong  Sputum Amount Small  Sputum Color Tan  Sputum Consistency Thick  Sputum Specimen Source Tracheostomy tube  Bilateral Breath Sounds Diminished;Rhonchi  Oxygen Therapy/Pulse Ox  O2 Device (S)  Tracheostomy Collar  O2 Therapy Oxygen humidified  O2 Flow Rate (L/min) (S)  8 L/min  FiO2 (%) (S)  30 %  Tracheostomy Shiley Flexible 6 mm Cuffed  Placement Date/Time: 05/23/24 1042   Placed By: Other (Comment)  Brand: Shiley Flexible  Size (mm): 6 mm  Style: Cuffed  Status Secured with trach ties  Site Assessment Clean;Dry  Site Care Cleansed;Dried;Dressing applied  Inner Cannula Care Changed/new  Ties Assessment Clean, Dry  Cuff Pressure (cm H2O)  (deflated)  Tracheostomy Equipment at bedside Yes and checklist posted at head of bed

## 2024-05-26 NOTE — Evaluation (Signed)
 RT Evaluate and Treat Note  05/26/2024   Breathing is (select one): Same as normal    The following was found on auscultation (select multiple):  Bilateral Breath Sounds: Diminished;Rhonchi (05/26/24 0805)  R Upper  Breath Sounds: Rhonchi (05/26/24 0134) L Upper Breath Sounds: Rhonchi (05/26/24 0134) R Lower Breath Sounds: Diminished (05/26/24 0134) L Lower Breath Sounds: Diminished (05/26/24 0134)    Cough Assessment: Cough: Productive;Congested;Strong (05/26/24 0805)    Most Recent Chest Xray:... (DG CHEST PORT 1 VIEW Result Date: 05/25/2024 EXAM: 1 VIEW XRAY OF THE CHEST 05/25/2024 12:36:00 PM COMPARISON: Prior radiograph dated 05/10/2024. CLINICAL HISTORY: SOB (shortness of breath). FINDINGS: LINES, TUBES AND DEVICES: Tracheostomy above the carina in similar position. LUNGS AND PLEURA: Shallow inspiration. Right lung base streaky atelectasis improved since the prior radiograph. Pneumonia is not excluded. No pulmonary edema. No pleural effusion. No pneumothorax. HEART AND MEDIASTINUM: Stable mild cardiomegaly. No acute abnormality of the mediastinal silhouette. BONES AND SOFT TISSUES: No acute osseous abnormality. IMPRESSION: 1. Right lung base streaky atelectasis, improved; pneumonia not excluded. Electronically signed by: Vanetta Chou MD 05/25/2024 02:21 PM EDT RP Workstation: [id number]      The following medications and/or interventions were ordered/changed/discontinued as part of the Respiratory Treatment protocol:   Medication Changes: none   Airway Clearance Changes: none   Oxygen Therapy Changes: none, current ATC during the day and Vent at bedtime.

## 2024-05-26 NOTE — Progress Notes (Signed)
 Pt taken off ATC at this time and placed on documented full support vent settings to rest for the night

## 2024-05-26 NOTE — Progress Notes (Signed)
 [facility]                                                                               Garrett [name], is a 48 y.o. male, DOB - [birth year], FMW:[id number] Admit date - 10/27/2023    Outpatient Primary MD for the patient is Pcp, No  LOS - 212  days    Brief summary   48 y/o male with past medical history of hypertension was found down at home agonal breathing. History of uncontrolled hypertension but never had been to the doctor. Patient was noted to have a large (4.1 cm) hemorrhage centered in the pons with intraventricular extension into the fourth ventricle and also extension into the basal cisterns and trace additional subarachnoid hemorrhage along the left parietal convexity. BP in ED was 262/156. CXR revealed RUL and perihilar infiltrates suggesting aspiration pneumonia. ED labs also revealed PH 7.169, LA 4.4, CPK 985. Patient was intubated in ED and was admitted hospital.    Patient had prolonged complicated hospital stay pontine hemorrhage, biparietal SAH.  Patient's mental status poor, s/p tracheostomy and PEG placement.  Sputum cultures positive for MRSA on contact precautions.  Patient had prolonged difficulty weaning from vent.  Patient was on trach collar for few days but later required ventilatory support mostly at nights and is currently in ICU.   No events overnight. Currently waiting to hear from [facility]. Trach changed without any issues.   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 BP parameters are optimal.  Poor chance of any meaning full recovery. But continue to monitor.    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  Not a candidate for  decannulation given ineffective airway clearance, -Continue trach collar during day with vent support at night. He is on 8 lit of oxygen via trach collar.  -Continue trach care per protocol. -Pulmonary follow up appreciated.  -  trach replaced on 05/23/24. - no events overnight.  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 Waylan evaluated and placed tarsorraphy.   -8/29-ophtho rec: Erythromycin  eyedrop q2h, tape eyes with paper tape if not blinking. -9/14, ophtho, Dr. Leane reengaged due to worsening conjunctivitis and clouding recommended second antibiotic, gatifloxacin  4 times daily.per Optho, no epithelial defects noted. - 9/25: courses completed of abx drops; continue on opth ointment and cover left eye with patch and/or tape to keep lid closed  - 9/29, more injected with discharge again in OS, resume 5 day course of Polytrim  eyedrops to left eye - slowly improving.    Pseudomonal colonization -Trach culture aspirate obtained on 05/10/2024 due to SIRS criteria; fever defervesced without antibiotics and no increase in secretions or decline clinically  -Continue to evaluate any recurrent SIRS criteria in clinical context - afebrile, wbc count wnl.    Hypernatremia: Resolved. - Continue free water  at 150 cc every 6 hours. -  Intermittently monitor labs - repeat sodium levels wnl.    Elevated liver enzymes: Mild.  Unclear etiology.  CK normal. - check intermittently.   Inadequate oral intake - PEG replaced by IR on 05/15/24 after becoming dislodged again Body mass index is 32.18 kg/m. Nutrition Problem: Inadequate oral intake Etiology: inability to eat Signs/Symptoms: NPO status Interventions: Tube feeding, Prostat, MVI, Juven    Malnutrition Type:  Nutrition Problem: Inadequate oral intake Etiology: inability to eat   Malnutrition Characteristics:  Signs/Symptoms: NPO status   Nutrition  Interventions:  Interventions: Tube feeding, Prostat, MVI, Juven  Estimated body mass index is 30.84 kg/m as calculated from the following:   Height as of this encounter: 5' 8 (1.727 m).   Weight as of this encounter: 92 kg.  Code Status: full code.  DVT Prophylaxis:  enoxaparin  (LOVENOX ) injection 40 mg Start: 03/06/24 1000 SCDs Start: 10/27/23 2226   Level of Care: Level of care: ICU Family Communication:none at bedside.   Disposition Plan:     Remains inpatient appropriate:  pending.      Consultants:   PCCM.  Ophthalmology.   Antimicrobials:   Anti-infectives (From admission, onward)    Start     Dose/Rate Route Frequency Ordered Stop   04/09/24 2200  linezolid  (ZYVOX ) tablet 600 mg  Status:  Discontinued        600 mg Per Tube Every 12 hours 04/09/24 1132 04/13/24 1332   04/09/24 1700  vancomycin  (VANCOREADY) IVPB 1500 mg/300 mL  Status:  Discontinued        1,500 mg 150 mL/hr over 120 Minutes Intravenous Every 12 hours 04/09/24 1108 04/09/24 1132   04/09/24 1000  linezolid  (ZYVOX ) IVPB 600 mg  Status:  Discontinued        600 mg 300 mL/hr over 60 Minutes Intravenous Every 12 hours 04/09/24 0527 04/09/24 1108   04/01/24 1145  ceFEPIme  (MAXIPIME ) 2 g in sodium chloride  0.9 % 100 mL IVPB        2 g 200 mL/hr over 30 Minutes Intravenous Every 8 hours 04/01/24 1137 04/14/24 2230   02/21/24 1000  vancomycin  (VANCOREADY) IVPB 1250 mg/250 mL        1,250 mg 166.7 mL/hr over 90 Minutes Intravenous Every 12 hours 02/20/24 2158 02/28/24 0130   02/16/24 1445  ceFEPIme  (MAXIPIME ) 2 g in sodium chloride  0.9 % 100 mL IVPB        2 g 200 mL/hr over 30 Minutes Intravenous Every 8 hours 02/16/24 1348 02/27/24 2322   02/16/24 0945  vancomycin  (VANCOREADY) IVPB 1500 mg/300 mL  Status:  Discontinued        1,500 mg 150 mL/hr over 120 Minutes Intravenous Every 12 hours 02/16/24 0848 02/20/24 2158   02/15/24 1215  Ampicillin -Sulbactam (UNASYN ) 3 g in sodium chloride  0.9 % 100 mL  IVPB  Status:  Discontinued        3 g 200 mL/hr over 30 Minutes Intravenous Every 6 hours 02/15/24 1125 02/16/24 1226   01/24/24 2000  vancomycin  (VANCOREADY) IVPB 1250 mg/250 mL        1,250 mg 166.7 mL/hr over 90 Minutes Intravenous Every 24 hours 01/24/24 1154 01/27/24 0125   01/22/24 2200  Vancomycin  (VANCOCIN ) 1,250 mg in sodium chloride  0.9 % 250 mL IVPB  Status:  Discontinued        1,250 mg 166.7 mL/hr over 90 Minutes Intravenous Every 24 hours 01/22/24 1013 01/22/24 1020   01/22/24 2000  vancomycin  (VANCOREADY) IVPB 1250 mg/250 mL  Status:  Discontinued  1,250 mg 166.7 mL/hr over 90 Minutes Intravenous Every 24 hours 01/22/24 1020 01/24/24 1154   01/20/24 2000  ceFEPIme  (MAXIPIME ) 2 g in sodium chloride  0.9 % 100 mL IVPB  Status:  Discontinued        2 g 200 mL/hr over 30 Minutes Intravenous Every 8 hours 01/20/24 1900 01/24/24 1154   01/20/24 2000  Vancomycin  (VANCOCIN ) 1,250 mg in sodium chloride  0.9 % 250 mL IVPB  Status:  Discontinued        1,250 mg 166.7 mL/hr over 90 Minutes Intravenous Every 24 hours 01/20/24 1900 01/22/24 1013   01/03/24 1730  cefTRIAXone  (ROCEPHIN ) 1 g in sodium chloride  0.9 % 100 mL IVPB        1 g 200 mL/hr over 30 Minutes Intravenous Every 24 hours 01/03/24 1235 01/07/24 0710   01/02/24 1830  ceFEPIme  (MAXIPIME ) 2 g in sodium chloride  0.9 % 100 mL IVPB  Status:  Discontinued        2 g 200 mL/hr over 30 Minutes Intravenous Every 8 hours 01/02/24 1732 01/03/24 1235   01/02/24 1830  Vancomycin  (VANCOCIN ) 1,250 mg in sodium chloride  0.9 % 250 mL IVPB  Status:  Discontinued        1,250 mg 166.7 mL/hr over 90 Minutes Intravenous Every 24 hours 01/02/24 1732 01/04/24 1754   01/01/24 1200  cefTRIAXone  (ROCEPHIN ) 1 g in sodium chloride  0.9 % 100 mL IVPB  Status:  Discontinued        1 g 200 mL/hr over 30 Minutes Intravenous Every 24 hours 01/01/24 1057 01/02/24 1713   11/29/23 2200  linezolid  (ZYVOX ) tablet 600 mg        600 mg Per Tube Every 12  hours 11/29/23 1034 12/04/23 2215   11/28/23 1000  linezolid  (ZYVOX ) IVPB 600 mg  Status:  Discontinued        600 mg 300 mL/hr over 60 Minutes Intravenous Every 12 hours 11/28/23 0831 11/29/23 1034   11/23/23 1200  vancomycin  (VANCOREADY) IVPB 1250 mg/250 mL  Status:  Discontinued        1,250 mg 166.7 mL/hr over 90 Minutes Intravenous Every 24 hours 11/23/23 1036 11/28/23 0831   11/20/23 1100  vancomycin  (VANCOREADY) IVPB 1250 mg/250 mL  Status:  Discontinued        1,250 mg 166.7 mL/hr over 90 Minutes Intravenous Every 24 hours 11/20/23 0923 11/20/23 0923   11/20/23 1100  vancomycin  (VANCOREADY) IVPB 1250 mg/250 mL  Status:  Discontinued        1,250 mg 166.7 mL/hr over 90 Minutes Intravenous Every 24 hours 11/20/23 0923 11/23/23 1036   11/15/23 2300  vancomycin  (VANCOREADY) IVPB 1250 mg/250 mL        1,250 mg 166.7 mL/hr over 90 Minutes Intravenous Every 24 hours 11/15/23 2215 11/19/23 0924   11/12/23 2200  vancomycin  (VANCOREADY) IVPB 750 mg/150 mL  Status:  Discontinued        750 mg 150 mL/hr over 60 Minutes Intravenous Every 12 hours 11/12/23 0855 11/15/23 2215   11/12/23 0930  vancomycin  (VANCOREADY) IVPB 2000 mg/400 mL        2,000 mg 200 mL/hr over 120 Minutes Intravenous  Once 11/12/23 0838 11/12/23 1116   11/11/23 1345  Ampicillin -Sulbactam (UNASYN ) 3 g in sodium chloride  0.9 % 100 mL IVPB  Status:  Discontinued        3 g 200 mL/hr over 30 Minutes Intravenous Every 6 hours 11/11/23 1257 11/12/23 0836   10/28/23 0400  Ampicillin -Sulbactam (UNASYN ) 3 g in  sodium chloride  0.9 % 100 mL IVPB  Status:  Discontinued        3 g 200 mL/hr over 30 Minutes Intravenous Every 6 hours 10/27/23 2151 11/02/23 0945   10/27/23 1845  vancomycin  (VANCOCIN ) IVPB 1000 mg/200 mL premix  Status:  Discontinued        1,000 mg 200 mL/hr over 60 Minutes Intravenous  Once 10/27/23 1843 10/27/23 1843   10/27/23 1845  ceFEPIme  (MAXIPIME ) 2 g in sodium chloride  0.9 % 100 mL IVPB        2 g 200 mL/hr  over 30 Minutes Intravenous  Once 10/27/23 1843 10/27/23 2024   10/27/23 1845  vancomycin  (VANCOREADY) IVPB 2000 mg/400 mL        2,000 mg 200 mL/hr over 120 Minutes Intravenous  Once 10/27/23 1843 10/27/23 2123        Medications  Scheduled Meds:  amLODipine   10 mg Per Tube Daily   artificial tears   Both Eyes BID   Chlorhexidine  Gluconate Cloth  6 each Topical Daily   enoxaparin  (LOVENOX ) injection  40 mg Subcutaneous Daily   famotidine   20 mg Per Tube BID   feeding supplement (JEVITY 1.5 CAL/FIBER)  237 mL Per Tube 5 X Daily   feeding supplement (PROSource TF20)  60 mL Per Tube TID   free water   150 mL Per Tube Q6H   insulin  aspart  0-9 Units Subcutaneous Q6H   metoprolol  tartrate  75 mg Per Tube BID   multivitamin with minerals  1 tablet Per Tube Daily   mupirocin  ointment  1 Application Nasal BID   mouth rinse  15 mL Mouth Rinse Q2H   polyethylene glycol  17 g Per Tube Daily   Continuous Infusions: PRN Meds:.acetaminophen  **OR** acetaminophen  (TYLENOL ) oral liquid 160 mg/5 mL **OR** acetaminophen , bisacodyl , Gerhardt's butt cream, hydrALAZINE , ipratropium-albuterol , mouth rinse, traZODone     Subjective:   Garrett [name] was seen and examined today. No events.   Objective:   Vitals:   05/26/24 1200 05/26/24 1300 05/26/24 1400 05/26/24 1528  BP: 125/86 127/89 (!) 136/91   Pulse: 79 81 87   Resp: (!) 28 (!) 24 (!) 23   Temp:    98.6 F (37 C)  TempSrc:      SpO2: 92% 93% (!) 87%   Weight:      Height:        Intake/Output Summary (Last 24 hours) at 05/26/2024 1628 Last data filed at 05/26/2024 1420 Gross per 24 hour  Intake 1311 ml  Output 1250 ml  Net 61 ml   Filed Weights   05/24/24 0500 05/25/24 0500 05/26/24 0500  Weight: 92 kg 92 kg 92 kg     Exam General exam: Appears calm and comfortable  Respiratory system: Clear to auscultation. Respiratory effort normal. Cardiovascular system: S1 & S2 heard, RRR. No JVD,  Gastrointestinal system:  Abdomen is nondistended, soft and nontender. Central nervous system:  not following commands.  Extremities:  no edema.  Skin: No rashes,         Data Reviewed:  I have personally reviewed following labs and imaging studies   CBC Lab Results  Component Value Date   WBC 10.5 05/22/2024   RBC 3.92 (L) 05/22/2024   HGB 10.7 (L) 05/22/2024   HCT 35.0 (L) 05/22/2024   MCV 89.3 05/22/2024   MCH 27.3 05/22/2024   PLT 444 (H) 05/22/2024   MCHC 30.6 05/22/2024   RDW 16.3 (H) 05/22/2024   LYMPHSABS 1.9 05/22/2024  MONOABS 0.8 05/22/2024   EOSABS 0.1 05/22/2024   BASOSABS 0.0 05/22/2024     Last metabolic panel Lab Results  Component Value Date   NA 138 05/22/2024   K 4.5 05/22/2024   CL 99 05/22/2024   CO2 28 05/22/2024   BUN 22 (H) 05/22/2024   CREATININE 0.43 (L) 05/22/2024   GLUCOSE 134 (H) 05/22/2024   GFRNONAA >60 05/22/2024   GFRAA >60 01/15/2018   CALCIUM 9.0 05/22/2024   PHOS 4.0 05/11/2024   PROT 7.9 05/12/2024   ALBUMIN  3.0 (L) 05/12/2024   BILITOT 0.2 05/12/2024   ALKPHOS 65 05/12/2024   AST 50 (H) 05/12/2024   ALT 115 (H) 05/12/2024   ANIONGAP 11 05/22/2024    CBG (last 3)  Recent Labs    05/26/24 0036 05/26/24 0512 05/26/24 1112  GLUCAP 101* 95 143*      Coagulation Profile: No results for input(s): INR, PROTIME in the last 168 hours.   Radiology Studies: DG CHEST PORT 1 VIEW Result Date: 05/25/2024 EXAM: 1 VIEW XRAY OF THE CHEST 05/25/2024 12:36:00 PM COMPARISON: Prior radiograph dated 05/10/2024. CLINICAL HISTORY: SOB (shortness of breath). FINDINGS: LINES, TUBES AND DEVICES: Tracheostomy above the carina in similar position. LUNGS AND PLEURA: Shallow inspiration. Right lung base streaky atelectasis improved since the prior radiograph. Pneumonia is not excluded. No pulmonary edema. No pleural effusion. No pneumothorax. HEART AND MEDIASTINUM: Stable mild cardiomegaly. No acute abnormality of the mediastinal silhouette. BONES AND SOFT  TISSUES: No acute osseous abnormality. IMPRESSION: 1. Right lung base streaky atelectasis, improved; pneumonia not excluded. Electronically signed by: Vanetta Chou MD 05/25/2024 02:21 PM EDT RP Workstation: [id number]       Elgie Butter M.D. [facility] 05/26/2024, 4:28 PM  Available via Epic secure chat 7am-7pm After 7 pm, please refer to night coverage provider listed on amion.

## 2024-05-27 DIAGNOSIS — J9611 Chronic respiratory failure with hypoxia: Secondary | ICD-10-CM | POA: Diagnosis not present

## 2024-05-27 DIAGNOSIS — Z930 Tracheostomy status: Secondary | ICD-10-CM | POA: Diagnosis not present

## 2024-05-27 DIAGNOSIS — I619 Nontraumatic intracerebral hemorrhage, unspecified: Secondary | ICD-10-CM | POA: Diagnosis not present

## 2024-05-27 DIAGNOSIS — Z789 Other specified health status: Secondary | ICD-10-CM | POA: Diagnosis not present

## 2024-05-27 LAB — GLUCOSE, CAPILLARY
Glucose-Capillary: 100 mg/dL — ABNORMAL HIGH (ref 70–99)
Glucose-Capillary: 142 mg/dL — ABNORMAL HIGH (ref 70–99)
Glucose-Capillary: 148 mg/dL — ABNORMAL HIGH (ref 70–99)
Glucose-Capillary: 159 mg/dL — ABNORMAL HIGH (ref 70–99)
Glucose-Capillary: 87 mg/dL (ref 70–99)

## 2024-05-27 NOTE — Progress Notes (Signed)
 [facility]                                                                               Garrett [name], is a 48 y.o. male, DOB - 1976/07/05, FMW:[id number] Admit date - 10/27/2023    Outpatient Primary MD for the patient is Pcp, No  LOS - 213  days    Brief summary   48 y/o male with past medical history of hypertension was found down at home agonal breathing. History of uncontrolled hypertension but never had been to the doctor. Patient was noted to have a large (4.1 cm) hemorrhage centered in the pons with intraventricular extension into the fourth ventricle and also extension into the basal cisterns and trace additional subarachnoid hemorrhage along the left parietal convexity. BP in ED was 262/156. CXR revealed RUL and perihilar infiltrates suggesting aspiration pneumonia. ED labs also revealed PH 7.169, LA 4.4, CPK 985. Patient was intubated in ED and was admitted hospital.    Patient had prolonged complicated hospital stay pontine hemorrhage, biparietal SAH.  Patient's mental status poor, s/p tracheostomy and PEG placement.  Sputum cultures positive for MRSA on contact precautions.  Patient had prolonged difficulty weaning from vent.  Patient was on trach collar for few days but later required ventilatory support mostly at nights and is currently in ICU.   No events overnight. Currently waiting to hear from [facility]. Trach changed without any issues.   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 BP parameters are optimal.  Poor chance of any meaning full recovery. But continue to monitor.  Patients mental status remains the same in the last one week.    Acute on chronic resp failure w hypoxia and hypercarbia  Trach  dependence -S/p tracheostomy redo 7/16.  Not a candidate for decannulation given ineffective airway clearance, -Continue trach collar during day with vent support at night. He is on 8 lit of oxygen via trach collar.  -Continue trach care per protocol. -Pulmonary follow up appreciated.  -  trach replaced on 05/23/24. - CXR was done showing Right lung base streaky atelectasis, improved;  - no events overnight.    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 Waylan evaluated and placed tarsorraphy.   -8/29-ophtho rec: Erythromycin  eyedrop q2h, tape eyes with paper tape if not blinking. -9/14, ophtho, Dr. Leane reengaged due to worsening conjunctivitis and clouding recommended second antibiotic, gatifloxacin  4 times daily.per Optho, no epithelial defects noted. - 9/25: courses completed of abx drops; continue on opth ointment and cover left eye with patch and/or tape to keep lid closed  - 9/29, more injected with discharge again in OS, resume 5 day course of Polytrim  eyedrops to left eye - slowly improving.    Pseudomonal colonization -Trach culture aspirate obtained on 05/10/2024 due to SIRS criteria; fever defervesced without antibiotics and no increase in secretions or decline clinically  -Continue to evaluate any recurrent SIRS criteria in clinical context - afebrile, wbc count wnl.    Hypernatremia:  Resolved. - Continue free water  at 150 cc every 6 hours. - Intermittently monitor labs - repeat sodium levels wnl.    Elevated liver enzymes: Mild.  Unclear etiology.  CK normal. - check liver panel in am.    Inadequate oral intake - PEG replaced by IR on 05/15/24 after becoming dislodged again Body mass index is 32.18 kg/m. Nutrition Problem: Inadequate oral intake Etiology: inability to eat Signs/Symptoms: NPO status Interventions: Tube feeding, Prostat, MVI, Juven    Malnutrition Type:  Nutrition Problem: Inadequate oral intake Etiology: inability to  eat   Malnutrition Characteristics:  Signs/Symptoms: NPO status   Nutrition Interventions:  Interventions: Tube feeding, Prostat, MVI, Juven  Estimated body mass index is 30.84 kg/m as calculated from the following:   Height as of this encounter: 5' 8 (1.727 m).   Weight as of this encounter: 92 kg.  Code Status: full code.  DVT Prophylaxis:  enoxaparin  (LOVENOX ) injection 40 mg Start: 03/06/24 1000 SCDs Start: 10/27/23 2226   Level of Care: Level of care: ICU Family Communication:none at bedside.   Disposition Plan:     Remains inpatient appropriate:  pending.      Consultants:   PCCM.  Ophthalmology.   Antimicrobials:   Anti-infectives (From admission, onward)    Start     Dose/Rate Route Frequency Ordered Stop   04/09/24 2200  linezolid  (ZYVOX ) tablet 600 mg  Status:  Discontinued        600 mg Per Tube Every 12 hours 04/09/24 1132 04/13/24 1332   04/09/24 1700  vancomycin  (VANCOREADY) IVPB 1500 mg/300 mL  Status:  Discontinued        1,500 mg 150 mL/hr over 120 Minutes Intravenous Every 12 hours 04/09/24 1108 04/09/24 1132   04/09/24 1000  linezolid  (ZYVOX ) IVPB 600 mg  Status:  Discontinued        600 mg 300 mL/hr over 60 Minutes Intravenous Every 12 hours 04/09/24 0527 04/09/24 1108   04/01/24 1145  ceFEPIme  (MAXIPIME ) 2 g in sodium chloride  0.9 % 100 mL IVPB        2 g 200 mL/hr over 30 Minutes Intravenous Every 8 hours 04/01/24 1137 04/14/24 2230   02/21/24 1000  vancomycin  (VANCOREADY) IVPB 1250 mg/250 mL        1,250 mg 166.7 mL/hr over 90 Minutes Intravenous Every 12 hours 02/20/24 2158 02/28/24 0130   02/16/24 1445  ceFEPIme  (MAXIPIME ) 2 g in sodium chloride  0.9 % 100 mL IVPB        2 g 200 mL/hr over 30 Minutes Intravenous Every 8 hours 02/16/24 1348 02/27/24 2322   02/16/24 0945  vancomycin  (VANCOREADY) IVPB 1500 mg/300 mL  Status:  Discontinued        1,500 mg 150 mL/hr over 120 Minutes Intravenous Every 12 hours 02/16/24 0848 02/20/24 2158    02/15/24 1215  Ampicillin -Sulbactam (UNASYN ) 3 g in sodium chloride  0.9 % 100 mL IVPB  Status:  Discontinued        3 g 200 mL/hr over 30 Minutes Intravenous Every 6 hours 02/15/24 1125 02/16/24 1226   01/24/24 2000  vancomycin  (VANCOREADY) IVPB 1250 mg/250 mL        1,250 mg 166.7 mL/hr over 90 Minutes Intravenous Every 24 hours 01/24/24 1154 01/27/24 0125   01/22/24 2200  Vancomycin  (VANCOCIN ) 1,250 mg in sodium chloride  0.9 % 250 mL IVPB  Status:  Discontinued        1,250 mg 166.7 mL/hr over 90 Minutes Intravenous Every 24 hours 01/22/24 1013 01/22/24  1020   01/22/24 2000  vancomycin  (VANCOREADY) IVPB 1250 mg/250 mL  Status:  Discontinued        1,250 mg 166.7 mL/hr over 90 Minutes Intravenous Every 24 hours 01/22/24 1020 01/24/24 1154   01/20/24 2000  ceFEPIme  (MAXIPIME ) 2 g in sodium chloride  0.9 % 100 mL IVPB  Status:  Discontinued        2 g 200 mL/hr over 30 Minutes Intravenous Every 8 hours 01/20/24 1900 01/24/24 1154   01/20/24 2000  Vancomycin  (VANCOCIN ) 1,250 mg in sodium chloride  0.9 % 250 mL IVPB  Status:  Discontinued        1,250 mg 166.7 mL/hr over 90 Minutes Intravenous Every 24 hours 01/20/24 1900 01/22/24 1013   01/03/24 1730  cefTRIAXone  (ROCEPHIN ) 1 g in sodium chloride  0.9 % 100 mL IVPB        1 g 200 mL/hr over 30 Minutes Intravenous Every 24 hours 01/03/24 1235 01/07/24 0710   01/02/24 1830  ceFEPIme  (MAXIPIME ) 2 g in sodium chloride  0.9 % 100 mL IVPB  Status:  Discontinued        2 g 200 mL/hr over 30 Minutes Intravenous Every 8 hours 01/02/24 1732 01/03/24 1235   01/02/24 1830  Vancomycin  (VANCOCIN ) 1,250 mg in sodium chloride  0.9 % 250 mL IVPB  Status:  Discontinued        1,250 mg 166.7 mL/hr over 90 Minutes Intravenous Every 24 hours 01/02/24 1732 01/04/24 1754   01/01/24 1200  cefTRIAXone  (ROCEPHIN ) 1 g in sodium chloride  0.9 % 100 mL IVPB  Status:  Discontinued        1 g 200 mL/hr over 30 Minutes Intravenous Every 24 hours 01/01/24 1057 01/02/24 1713    11/29/23 2200  linezolid  (ZYVOX ) tablet 600 mg        600 mg Per Tube Every 12 hours 11/29/23 1034 12/04/23 2215   11/28/23 1000  linezolid  (ZYVOX ) IVPB 600 mg  Status:  Discontinued        600 mg 300 mL/hr over 60 Minutes Intravenous Every 12 hours 11/28/23 0831 11/29/23 1034   11/23/23 1200  vancomycin  (VANCOREADY) IVPB 1250 mg/250 mL  Status:  Discontinued        1,250 mg 166.7 mL/hr over 90 Minutes Intravenous Every 24 hours 11/23/23 1036 11/28/23 0831   11/20/23 1100  vancomycin  (VANCOREADY) IVPB 1250 mg/250 mL  Status:  Discontinued        1,250 mg 166.7 mL/hr over 90 Minutes Intravenous Every 24 hours 11/20/23 0923 11/20/23 0923   11/20/23 1100  vancomycin  (VANCOREADY) IVPB 1250 mg/250 mL  Status:  Discontinued        1,250 mg 166.7 mL/hr over 90 Minutes Intravenous Every 24 hours 11/20/23 0923 11/23/23 1036   11/15/23 2300  vancomycin  (VANCOREADY) IVPB 1250 mg/250 mL        1,250 mg 166.7 mL/hr over 90 Minutes Intravenous Every 24 hours 11/15/23 2215 11/19/23 0924   11/12/23 2200  vancomycin  (VANCOREADY) IVPB 750 mg/150 mL  Status:  Discontinued        750 mg 150 mL/hr over 60 Minutes Intravenous Every 12 hours 11/12/23 0855 11/15/23 2215   11/12/23 0930  vancomycin  (VANCOREADY) IVPB 2000 mg/400 mL        2,000 mg 200 mL/hr over 120 Minutes Intravenous  Once 11/12/23 0838 11/12/23 1116   11/11/23 1345  Ampicillin -Sulbactam (UNASYN ) 3 g in sodium chloride  0.9 % 100 mL IVPB  Status:  Discontinued        3 g  200 mL/hr over 30 Minutes Intravenous Every 6 hours 11/11/23 1257 11/12/23 0836   10/28/23 0400  Ampicillin -Sulbactam (UNASYN ) 3 g in sodium chloride  0.9 % 100 mL IVPB  Status:  Discontinued        3 g 200 mL/hr over 30 Minutes Intravenous Every 6 hours 10/27/23 2151 11/02/23 0945   10/27/23 1845  vancomycin  (VANCOCIN ) IVPB 1000 mg/200 mL premix  Status:  Discontinued        1,000 mg 200 mL/hr over 60 Minutes Intravenous  Once 10/27/23 1843 10/27/23 1843   10/27/23 1845   ceFEPIme  (MAXIPIME ) 2 g in sodium chloride  0.9 % 100 mL IVPB        2 g 200 mL/hr over 30 Minutes Intravenous  Once 10/27/23 1843 10/27/23 2024   10/27/23 1845  vancomycin  (VANCOREADY) IVPB 2000 mg/400 mL        2,000 mg 200 mL/hr over 120 Minutes Intravenous  Once 10/27/23 1843 10/27/23 2123        Medications  Scheduled Meds:  amLODipine   10 mg Per Tube Daily   artificial tears   Both Eyes BID   Chlorhexidine  Gluconate Cloth  6 each Topical Daily   enoxaparin  (LOVENOX ) injection  40 mg Subcutaneous Daily   famotidine   20 mg Per Tube BID   feeding supplement (JEVITY 1.5 CAL/FIBER)  237 mL Per Tube 5 X Daily   feeding supplement (PROSource TF20)  60 mL Per Tube TID   free water   150 mL Per Tube Q6H   insulin  aspart  0-9 Units Subcutaneous Q6H   metoprolol  tartrate  75 mg Per Tube BID   multivitamin with minerals  1 tablet Per Tube Daily   mupirocin  ointment  1 Application Nasal BID   mouth rinse  15 mL Mouth Rinse Q2H   polyethylene glycol  17 g Per Tube Daily   Continuous Infusions: PRN Meds:.acetaminophen  **OR** acetaminophen  (TYLENOL ) oral liquid 160 mg/5 mL **OR** acetaminophen , bisacodyl , Gerhardt's butt cream, hydrALAZINE , ipratropium-albuterol , mouth rinse, traZODone     Subjective:   Leonidus Hoffmann was seen and examined today.  no events in the last few days.   Objective:   Vitals:   05/27/24 1300 05/27/24 1400 05/27/24 1459 05/27/24 1500  BP: 126/85 124/88 129/86 129/86  Pulse: 81 86 91 90  Resp: (!) 29 (!) 28 (!) 29 (!) 28  Temp:   98.9 F (37.2 C)   TempSrc:      SpO2: 96% 95% 94% 93%  Weight:      Height:        Intake/Output Summary (Last 24 hours) at 05/27/2024 1647 Last data filed at 05/27/2024 1322 Gross per 24 hour  Intake 2072 ml  Output 1150 ml  Net 922 ml   Filed Weights   05/25/24 0500 05/26/24 0500 05/27/24 0500  Weight: 92 kg 92 kg 92 kg     Exam General exam: ill appearing gentleman, s/p trach, not in distress.  Respiratory  system: air entry fair, s/p trach  Cardiovascular system: S1 & S2 heard, RRR. No JVD,  Gastrointestinal system: Abdomen is soft bs+ s/p PEG Central nervous system: not following commands.  Extremities: no pedal edema.  Skin: No rashes,      Data Reviewed:  I have personally reviewed following labs and imaging studies   CBC Lab Results  Component Value Date   WBC 10.5 05/22/2024   RBC 3.92 (L) 05/22/2024   HGB 10.7 (L) 05/22/2024   HCT 35.0 (L) 05/22/2024   MCV 89.3 05/22/2024  MCH 27.3 05/22/2024   PLT 444 (H) 05/22/2024   MCHC 30.6 05/22/2024   RDW 16.3 (H) 05/22/2024   LYMPHSABS 1.9 05/22/2024   MONOABS 0.8 05/22/2024   EOSABS 0.1 05/22/2024   BASOSABS 0.0 05/22/2024     Last metabolic panel Lab Results  Component Value Date   NA 138 05/22/2024   K 4.5 05/22/2024   CL 99 05/22/2024   CO2 28 05/22/2024   BUN 22 (H) 05/22/2024   CREATININE 0.43 (L) 05/22/2024   GLUCOSE 134 (H) 05/22/2024   GFRNONAA >60 05/22/2024   GFRAA >60 01/15/2018   CALCIUM 9.0 05/22/2024   PHOS 4.0 05/11/2024   PROT 7.9 05/12/2024   ALBUMIN  3.0 (L) 05/12/2024   BILITOT 0.2 05/12/2024   ALKPHOS 65 05/12/2024   AST 50 (H) 05/12/2024   ALT 115 (H) 05/12/2024   ANIONGAP 11 05/22/2024    CBG (last 3)  Recent Labs    05/26/24 2359 05/27/24 0628 05/27/24 1201  GLUCAP 159* 100* 142*      Coagulation Profile: No results for input(s): INR, PROTIME in the last 168 hours.   Radiology Studies: No results found.      Elgie Butter M.D. [facility] 05/27/2024, 4:47 PM  Available via Epic secure chat 7am-7pm After 7 pm, please refer to night coverage provider listed on amion.

## 2024-05-27 NOTE — Evaluation (Signed)
 RT Evaluate and Treat Note  05/27/2024   Breathing is (select one): Same as normal    The following was found on auscultation (select multiple):  Bilateral Breath Sounds: Rhonchi (05/27/24 0800)  R Upper  Breath Sounds: Rhonchi (05/27/24 0800) L Upper Breath Sounds: Rhonchi (05/27/24 0800) R Lower Breath Sounds: Diminished (05/27/24 0800) L Lower Breath Sounds: Diminished (05/27/24 0800)    Cough Assessment: Cough: Congested;Productive (05/27/24 0800)    Most Recent Chest Xray:... (No results found.    The following medications and/or interventions were ordered/changed/discontinued as part of the Respiratory Treatment protocol:   Medication Changes: none   Airway Clearance Changes: none   Oxygen Therapy Changes: none, ATC during the day and vent at night.

## 2024-05-27 NOTE — Progress Notes (Signed)
 Pt taken off ATC and placed on documented full support vent settings to rest for the night. Pt tolerating well

## 2024-05-28 DIAGNOSIS — G835 Locked-in state: Secondary | ICD-10-CM

## 2024-05-28 DIAGNOSIS — Z978 Presence of other specified devices: Secondary | ICD-10-CM

## 2024-05-28 DIAGNOSIS — I613 Nontraumatic intracerebral hemorrhage in brain stem: Secondary | ICD-10-CM | POA: Diagnosis not present

## 2024-05-28 DIAGNOSIS — H16212 Exposure keratoconjunctivitis, left eye: Secondary | ICD-10-CM | POA: Diagnosis not present

## 2024-05-28 DIAGNOSIS — J9611 Chronic respiratory failure with hypoxia: Secondary | ICD-10-CM | POA: Diagnosis not present

## 2024-05-28 DIAGNOSIS — Z931 Gastrostomy status: Secondary | ICD-10-CM

## 2024-05-28 LAB — GLUCOSE, CAPILLARY
Glucose-Capillary: 94 mg/dL (ref 70–99)
Glucose-Capillary: 137 mg/dL — ABNORMAL HIGH (ref 70–99)
Glucose-Capillary: 120 mg/dL — ABNORMAL HIGH (ref 70–99)
Glucose-Capillary: 140 mg/dL — ABNORMAL HIGH (ref 70–99)

## 2024-05-28 NOTE — Assessment & Plan Note (Signed)
 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 Ophthalmology has instructed never to use gauze to cover left eye. Use only tape to keep upper eyelid shut.

## 2024-05-28 NOTE — Assessment & Plan Note (Signed)
 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 Pt only intermittently awake at times. Variable ability to follow commands.

## 2024-05-28 NOTE — Assessment & Plan Note (Addendum)
 05/28/24 no change of any further meaningful recovery. Pt will intermittently follow commands. Will need long term care at SNF. He has medicaid with LTC benefits according to most recent TOC notes.

## 2024-05-28 NOTE — Progress Notes (Signed)
 PROGRESS NOTE    Garrett [name]  FMW:[id number] DOB: [birth year] DOA: 10/27/2023 PCP: Pcp, No  Subjective: Pt seen and examined. Took 4.5 hours to review his 214 days of hospitalization and summarize it in his hospital course tab.   Hospital Course: CC: found down, unresponsive HPI: Garrett [name] is a 48 y.o. male a PMHx of HTN (does not follow with an MD) who  was found down with agonal breathing by fiancee after she returned from work. LKN was at 8 AM when he dropped her off at work.   Besides almost daily [name] and cigarette smoking, girlfriend is not aware of any other drugs.   He has no history of seizures and did not appear to be seizing on scene. After arrival to the ED, drug screen positive for opioids and he was given several doses of Narcan . Fiance stated that she can tell when his BP is really high; due to his eyes getting blood shot and visible sweating. He arrived being bagged by EMS. Dried emesis was noted all over his face. BP in the ED was 262/156. He was emergently intubated.    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 Basal cisterns and fourth ventricle are effaced without hydrocephalus. Trace additional subarachnoid hemorrhage was noted along the left parietal convexity.  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 He was intubated in the ER Week of March 9 - March 15. Pt started on Cleviprex .  Repeat CTA showed no change in ICH of pons and similar biparietal SAH.  Neurology told family that his hemorrhage is nonsurvivable. On 10-29-2023, Pt started on hypertonic 3% saline due to cytotoxic edema and mass effect on brainstem. Palliative care consulted. Family did not want trach or PEG. Code status changed to DNR on 11-01-2023. Hypertonic saline stopped on  11-02-2023. On 11-02-2023, first noted that pt appears locked in. Pt can follow commands with his eyes. Week of March 16 - March 22. 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 Pt has some purposeful movement of left upper extremity. 11-08-2023 pt's mother and pt's fiance now want pt to have tracheostomy despite their initial statements that pt would never want a trach. 11-10-2023 pt has tracheostomy placed.  Week of March 23 - March 29. 11-12-2023 pt has PEG placed. Trach sputum cx grows MRSA.  Started on IV Vanco.  11-14-2023 pt able to follow commands using his left arm, left leg and can move his eyes up and down. Can withdraw with right leg. No movement in right arm. LTAC referral made. Abd XR consistent with ileus. Had BM after enemas. More obtunded and following less commands Week of March 30 - April 5. Tolerating CPAP trial with pressure support 15 cm Inspiratory, 5 cm expiratory. Has another ileus. Neurology signs off case on 11-19-2023. Pt's mother with unrealistic expectations that pt will walk again. Week of April 6 - April 12. Failed trach collar trials due to thick secretions. Back on mechanical ventilation. 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 Trach sputum cx growing MRSA again. Treated with zyvox . Trach collar trials again. Trial of Provigil  to improve alertness. Week of April 13 - April 19. Transferred to hospitalist service.  Still has #8 cuffed Shiley XLT  Week of April 20 - April 26. Trached changed to cuffless trach #8 Shiley XLT. Medicaid approved. Still awaiting  for disability insurance. Week of April 27 - May 3. IV fentanyl  stopped on 12-19-2023. Mental status improved off opiates. Following simple commands to grimace, move left toes, track vertically with right eye. Tracheostomy tube downsized to cuffless #6 Shiley XLT proximal Week of May 4 - May 10. Provigil  on hold. PEG became dislodged. IR consulted to replace PEG. Week of May 11 - May 17. Unable  to speak or produce any sounds with Passey-Muir valve. Not a candidate for decannulation given difficulty with phonation and inability to protect airway. Using communication yes no board. Still no disability insurance. Pt develops fevers. Urine cx grows E. Coli. Pt treated with IV Rocephin  for 5 days. Week of May 18 - 24. No major changes this week. Week of May 25 - May 31. Mother updated. No major changes Week of June 1 - June 7. Pt develops tachycardia and low grade fevers. Empirically started on cefepime /vanco. Trach sputum cx grows MRSA.  Week of June 8 - June 14.  Trach changed to #4 cuffless Shiley. On 01-30-2024. RT has difficulty passing suction catheter through #4 Shiley. Emergent bedside bronch showed distal end of tracheostomy tube was hitting tracheal wall with ulceration, edema and mucosal thickening. PCCM unable to re-insert trach. Pt decannulated.  Foley changed out. Week of June 15 - June 21. Family meeting with fiance and pt's mother. Family decided pt is NOT same to return to their home. Week of June 22 - June 28. Disability insurance still pending. PCCM called on 02-15-2024 due to desaturations. NT suctioned. Week of June 29 - July 5. Started on vanco/cefepime  due to hypoxia and increased secretions. PCCM re-engaged. Trach cx grew MRSA and klebsiella. Week of July 6 - July 12. PCCM decides that pt needs to have trach replaced for secretion management. Palliative care discusses with pt's parents. Parents want to exclude pt's significant other [name] from decision making. 02-25-2024. PCCM called emergently to bedside due to respiratory distress. Pt moved to ICU and intubated. ENT consulted for redo tracheostomy. Foley exchanged. On 02-28-2024, family reverses code status. Pt changed to FULL CODE. Week of July 13 - July 19. ENT performs redo tracheostomy. #6 cuffed Shiley. PCCM recommends that pt NEVER be decannulated.  Week of July 20 - July 26. On trach collar trials. No major changes. Pt  remains on ICU. Week of July 27 - August 2. PCCM feels that pt's respiratory tract is colonized and to only repeat respiratory cultures if secretions increase, develops leukocytosis, fever, etc(signs of systemic illness). Tolerating trach collar for most of the day and night. But by 6 am the next day, his work of breathing becomes labored and needs to go back on mechanical ventilation. Week of August 3 - August 9. Trach collar trials to see if he last 48 hours on just trach collar. Pt fails to last 48 hours on trach collar only. Plans made to use trach collar during day and mechanical ventilation at night. Pt's care transferred to hospitalist service on 03-27-2024. Pt to remains in ICU due to need for nighttime mechanical ventilation. PICC line removed.  Week of August 10 - August 16. Pt lasts 24 hours on trach collar only without nighttime mechanical ventilation. Ophthalmology performs left temporary tarsorrhaphy. Tracheal aspirate and urine cx shows Pseudomonas. Started on IV cefepime  for 14 days. Week of August 17 - August 23. Evening of 04-09-2024, pt desats to 30%. PCCM re-engaged. Changed to cuffless trach #6 Shliey. Pt transferred back to ICU and placed back on mechanical ventilation.  Week of August 24 - August 30. On 04-14-2024, ophthalmology removes left eye tarsorrhaphy. PCCM decides on trach collar during day and mechanical ventilation at night. Pt transfers back to hospitalist service. Week of August 31 - September 6. Continue trach collar during day and mechanical ventilation at night. Pt develops hypercapnia when left on extended trach collar. Pt should never be decannulated as he has already failed trach decannulation. Still waiting for disability insurance. Week of September 7 - September 13. No major issues this week.4 Week of September 14 - September 20. Still awaiting disability insurance. Continues on trach collar during daytime and nighttime mechanical ventilation. Pt has low grade fevers.  [name] changed out 05-08-2024 Week of September 21 - September 27. Fevers resolved. No abx started. Week of September 28 - October 4. No major changes. Week of October 5 - October 7. PCCM reengaged due to tachypnea and increased work of breathing.   Antibiotic Therapy: Anti-infectives (From admission, onward)    Start     Dose/Rate Route Frequency Ordered Stop   04/09/24 2200  linezolid  (ZYVOX ) tablet 600 mg  Status:  Discontinued        600 mg Per Tube Every 12 hours 04/09/24 1132 04/13/24 1332   04/09/24 1700  vancomycin  (VANCOREADY) IVPB 1500 mg/300 mL  Status:  Discontinued        1,500 mg 150 mL/hr over 120 Minutes Intravenous Every 12 hours 04/09/24 1108 04/09/24 1132   04/09/24 1000  linezolid  (ZYVOX ) IVPB 600 mg  Status:  Discontinued        600 mg 300 mL/hr over 60 Minutes Intravenous Every 12 hours 04/09/24 0527 04/09/24 1108   04/01/24 1145  ceFEPIme  (MAXIPIME ) 2 g in sodium chloride  0.9 % 100 mL IVPB        2 g 200 mL/hr over 30 Minutes Intravenous Every 8 hours 04/01/24 1137 04/14/24 2230   02/21/24 1000  vancomycin  (VANCOREADY) IVPB 1250 mg/250 mL        1,250 mg 166.7 mL/hr over 90 Minutes Intravenous Every 12 hours 02/20/24 2158 02/28/24 0130   02/16/24 1445  ceFEPIme  (MAXIPIME ) 2 g in sodium chloride  0.9 % 100 mL IVPB        2 g 200 mL/hr over 30 Minutes Intravenous Every 8 hours 02/16/24 1348 02/27/24 2322   02/16/24 0945  vancomycin  (VANCOREADY) IVPB 1500 mg/300 mL  Status:  Discontinued        1,500 mg 150 mL/hr over 120 Minutes Intravenous Every 12 hours 02/16/24 0848 02/20/24 2158   02/15/24 1215  Ampicillin -Sulbactam (UNASYN ) 3 g in sodium chloride  0.9 % 100 mL IVPB  Status:  Discontinued        3 g 200 mL/hr over 30 Minutes Intravenous Every 6 hours 02/15/24 1125 02/16/24 1226   01/24/24 2000  vancomycin  (VANCOREADY) IVPB 1250 mg/250 mL        1,250 mg 166.7 mL/hr over 90 Minutes Intravenous Every 24 hours 01/24/24 1154 01/27/24 0125   01/22/24 2200   Vancomycin  (VANCOCIN ) 1,250 mg in sodium chloride  0.9 % 250 mL IVPB  Status:  Discontinued        1,250 mg 166.7 mL/hr over 90 Minutes Intravenous Every 24 hours 01/22/24 1013 01/22/24 1020   01/22/24 2000  vancomycin  (VANCOREADY) IVPB 1250 mg/250 mL  Status:  Discontinued        1,250 mg 166.7 mL/hr over 90 Minutes Intravenous Every 24 hours 01/22/24 1020 01/24/24 1154   01/20/24 2000  ceFEPIme  (MAXIPIME ) 2 g in sodium chloride   0.9 % 100 mL IVPB  Status:  Discontinued        2 g 200 mL/hr over 30 Minutes Intravenous Every 8 hours 01/20/24 1900 01/24/24 1154   01/20/24 2000  Vancomycin  (VANCOCIN ) 1,250 mg in sodium chloride  0.9 % 250 mL IVPB  Status:  Discontinued        1,250 mg 166.7 mL/hr over 90 Minutes Intravenous Every 24 hours 01/20/24 1900 01/22/24 1013   01/03/24 1730  cefTRIAXone  (ROCEPHIN ) 1 g in sodium chloride  0.9 % 100 mL IVPB        1 g 200 mL/hr over 30 Minutes Intravenous Every 24 hours 01/03/24 1235 01/07/24 0710   01/02/24 1830  ceFEPIme  (MAXIPIME ) 2 g in sodium chloride  0.9 % 100 mL IVPB  Status:  Discontinued        2 g 200 mL/hr over 30 Minutes Intravenous Every 8 hours 01/02/24 1732 01/03/24 1235   01/02/24 1830  Vancomycin  (VANCOCIN ) 1,250 mg in sodium chloride  0.9 % 250 mL IVPB  Status:  Discontinued        1,250 mg 166.7 mL/hr over 90 Minutes Intravenous Every 24 hours 01/02/24 1732 01/04/24 1754   01/01/24 1200  cefTRIAXone  (ROCEPHIN ) 1 g in sodium chloride  0.9 % 100 mL IVPB  Status:  Discontinued        1 g 200 mL/hr over 30 Minutes Intravenous Every 24 hours 01/01/24 1057 01/02/24 1713   11/29/23 2200  linezolid  (ZYVOX ) tablet 600 mg        600 mg Per Tube Every 12 hours 11/29/23 1034 12/04/23 2215   11/28/23 1000  linezolid  (ZYVOX ) IVPB 600 mg  Status:  Discontinued        600 mg 300 mL/hr over 60 Minutes Intravenous Every 12 hours 11/28/23 0831 11/29/23 1034   11/23/23 1200  vancomycin  (VANCOREADY) IVPB 1250 mg/250 mL  Status:  Discontinued        1,250  mg 166.7 mL/hr over 90 Minutes Intravenous Every 24 hours 11/23/23 1036 11/28/23 0831   11/20/23 1100  vancomycin  (VANCOREADY) IVPB 1250 mg/250 mL  Status:  Discontinued        1,250 mg 166.7 mL/hr over 90 Minutes Intravenous Every 24 hours 11/20/23 0923 11/20/23 0923   11/20/23 1100  vancomycin  (VANCOREADY) IVPB 1250 mg/250 mL  Status:  Discontinued        1,250 mg 166.7 mL/hr over 90 Minutes Intravenous Every 24 hours 11/20/23 0923 11/23/23 1036   11/15/23 2300  vancomycin  (VANCOREADY) IVPB 1250 mg/250 mL        1,250 mg 166.7 mL/hr over 90 Minutes Intravenous Every 24 hours 11/15/23 2215 11/19/23 0924   11/12/23 2200  vancomycin  (VANCOREADY) IVPB 750 mg/150 mL  Status:  Discontinued        750 mg 150 mL/hr over 60 Minutes Intravenous Every 12 hours 11/12/23 0855 11/15/23 2215   11/12/23 0930  vancomycin  (VANCOREADY) IVPB 2000 mg/400 mL        2,000 mg 200 mL/hr over 120 Minutes Intravenous  Once 11/12/23 0838 11/12/23 1116   11/11/23 1345  Ampicillin -Sulbactam (UNASYN ) 3 g in sodium chloride  0.9 % 100 mL IVPB  Status:  Discontinued        3 g 200 mL/hr over 30 Minutes Intravenous Every 6 hours 11/11/23 1257 11/12/23 0836   10/28/23 0400  Ampicillin -Sulbactam (UNASYN ) 3 g in sodium chloride  0.9 % 100 mL IVPB  Status:  Discontinued        3 g 200 mL/hr over 30 Minutes Intravenous Every  6 hours 10/27/23 2151 11/02/23 0945   10/27/23 1845  vancomycin  (VANCOCIN ) IVPB 1000 mg/200 mL premix  Status:  Discontinued        1,000 mg 200 mL/hr over 60 Minutes Intravenous  Once 10/27/23 1843 10/27/23 1843   10/27/23 1845  ceFEPIme  (MAXIPIME ) 2 g in sodium chloride  0.9 % 100 mL IVPB        2 g 200 mL/hr over 30 Minutes Intravenous  Once 10/27/23 1843 10/27/23 2024   10/27/23 1845  vancomycin  (VANCOREADY) IVPB 2000 mg/400 mL        2,000 mg 200 mL/hr over 120 Minutes Intravenous  Once 10/27/23 1843 10/27/23 2123       Procedures: 10-27-2023 endotracheal intubation 11-10-2023 bedside  tracheostomy by PCCM 11-12-2023 bedside PEG by general surgery 01-30-2024 pt tracheostomy decannulated due to inability to replace trach. 02-25-2024 intubated for respiratory distress. 03-05-2024 redo tracheostomy by ENT 03-31-2024 Ophthalmology performs left temporary tarsorrhaphy  Consultants: Neurology PCCM General surgery Ophthalmology      Assessment and Plan: * Pontine hemorrhage (HCC) 05/28/24 no change of any further meaningful recovery. Pt will intermittently follow commands. Will need long term care at SNF. He has medicaid with LTC benefits according to most recent TOC notes.    S/P percutaneous endoscopic gastrostomy (PEG) tube placement (HCC) 05/28/24 continue with tube feeds.   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 He was suppose to have his foley catheter changed out monthly. Will need to change it out tomorrow.   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 Ophthalmology has instructed never to use gauze to cover left eye. Use only tape to keep upper eyelid shut.  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 Pt only intermittently awake at times. Variable ability to follow commands.   Chronic respiratory failure with hypoxia (HCC) 05/28/24 pt remains on trach collar during day and mechanical ventilation at night time.   Tracheostomy status (HCC) 05/28/24 remains on trach collar during daytime and mechanical ventilation at night. PCCM has advised that pt is never to be decannulated again.  He has already been decannulated before(June 2025) and failed.    DVT prophylaxis: enoxaparin  (LOVENOX ) injection 40 mg Start: 03/06/24 1000 SCDs Start: 10/27/23 2226     Code Status: Full Code Family Communication: no family at bedside Disposition Plan: LTC SNF vs LTAC Reason for continuing need for hospitalization:  remains on mechanical ventilation. He is stable for transfer to LTAC or SNF that can manage chronically ventilated patients.  Objective: Vitals:   05/28/24 1948 05/28/24 1957 05/28/24 2000 05/28/24 2100  BP:   (!) 133/103 (!) 124/100  Pulse:   (!) 105 98  Resp: (!) 23  15 14   Temp:  98.7 F (37.1 C)    TempSrc:  Axillary    SpO2:   98% 99%  Weight:      Height:        Intake/Output Summary (Last 24 hours) at 05/28/2024 2246 Last data filed at 05/28/2024 2000 Gross per 24 hour  Intake 600 ml  Output 1750 ml  Net -1150 ml   Filed Weights   05/26/24 0500 05/27/24 0500 05/28/24 0500  Weight: 92 kg 92 kg 96 kg    Examination:  Physical Exam Vitals and nursing note reviewed.  Constitutional:      Comments: Intermittently awake.  Eyes:     Comments: Left cornea cloudy  Neck:  Comments: +trach Pulmonary:     Effort: No respiratory distress.  Abdominal:     General: Bowel sounds are normal.     Palpations: Abdomen is soft.     Comments: +PEG  Musculoskeletal:     Comments: Both feet in Prevolon boots  Skin:    General: Skin is warm.     Capillary Refill: Capillary refill takes less than 2 seconds.  Neurological:     Comments: Does not follow commands.     Data Reviewed: I have personally reviewed following labs and imaging studies  CBC: Recent Labs  Lab 05/22/24 1514  WBC 10.5  NEUTROABS 7.5  HGB 10.7*  HCT 35.0*  MCV 89.3  PLT 444*   Basic Metabolic Panel: Recent Labs  Lab 05/22/24 1514  NA 138  K 4.5  CL 99  CO2 28  GLUCOSE 134*  BUN 22*  CREATININE 0.43*  CALCIUM 9.0   GFR: Estimated Creatinine Clearance: 128.2 mL/min (A) (by C-G formula based on SCr of 0.43 mg/dL (L)).  CBG: Recent Labs  Lab 05/27/24 1745 05/27/24 2358 05/28/24 0605 05/28/24 1117 05/28/24 1833  GLUCAP 87 148* 94 137* 120*    Recent Results (from the past 240 hours)  MRSA Next Gen by PCR, Nasal     Status: Abnormal   Collection Time: 05/23/24  2:14 PM    Specimen: Nasal Mucosa; Nasal Swab  Result Value Ref Range Status   MRSA by PCR Next Gen DETECTED (A) NOT DETECTED Final    Comment: (NOTE) The GeneXpert MRSA Assay (FDA approved for NASAL specimens only), is one component of a comprehensive MRSA colonization surveillance program. It is not intended to diagnose MRSA infection nor to guide or monitor treatment for MRSA infections. Test performance is not FDA approved in patients less than 60 years old. Performed at [facility], [address] [place], [place] [address]      Scheduled Meds:  amLODipine   10 mg Per Tube Daily   artificial tears   Both Eyes BID   Chlorhexidine  Gluconate Cloth  6 each Topical Daily   enoxaparin  (LOVENOX ) injection  40 mg Subcutaneous Daily   famotidine   20 mg Per Tube BID   feeding supplement (JEVITY 1.5 CAL/FIBER)  237 mL Per Tube 5 X Daily   feeding supplement (PROSource TF20)  60 mL Per Tube TID   free water   150 mL Per Tube Q6H   insulin  aspart  0-9 Units Subcutaneous Q6H   metoprolol  tartrate  75 mg Per Tube BID   multivitamin with minerals  1 tablet Per Tube Daily   mupirocin  ointment  1 Application Nasal BID   mouth rinse  15 mL Mouth Rinse Q2H   polyethylene glycol  17 g Per Tube Daily   Continuous Infusions:   LOS: 214 days   Time spent: 4.5 hours in summarizing and reviewing his 214 days of hospitalization, evaluation and management for today.  Camellia Door, DO  [facility]  05/28/2024, 10:46 PM

## 2024-05-28 NOTE — TOC Progression Note (Signed)
 Transition of Care [facility]) - Progression Note    Patient Details  Name: Garrett [name] MRN: [id number] Date of Birth: [birth year]  Transition of Care [facility]) CM/SW Contact  Lendia Dais, [place] Phone Number: 05/28/2024, 2:31 PM  Clinical Narrative:  CSW spoke to [place] [facility]. Angie stated that during their morning census meeting she was informed that they cannot admit Medicaid primary at all.  CSW will continue to monitor.    Expected Discharge Plan: Long Term Nursing Home Barriers to Discharge: Continued Medical Work up, SNF Pending Medicaid, SNF Pending bed offer, SNF Pending payor source - LOG, Inadequate or no insurance (New trach)               Expected Discharge Plan and Services In-house Referral: Clinical Social Work, Hospice / Palliative Care   Post Acute Care Choice: Skilled Nursing Facility Living arrangements for the past 2 months: Single Family Home                                       Social Drivers of Health (SDOH) Interventions SDOH Screenings   Food Insecurity: Patient Unable To Answer (10/29/2023)  Social Connections: Unknown (06/23/2023)   Received from [facility]  Tobacco Use: High Risk (10/31/2023)    Readmission Risk Interventions     No data to display

## 2024-05-28 NOTE — Evaluation (Deleted)
 RT Evaluate and Treat Note  05/28/2024   Breathing is (select one): Same as normal    The following was found on auscultation (select multiple):  Bilateral Breath Sounds: Diminished (05/28/24 0800)  R Upper  Breath Sounds: Clear (05/28/24 0301) L Upper Breath Sounds: Clear (05/28/24 0301) R Lower Breath Sounds: Diminished (05/28/24 0301) L Lower Breath Sounds: Diminished (05/28/24 0301)    Cough Assessment: Cough: Congested;Productive (05/27/24 2000)    Most Recent Chest Xray:... (No results found.  EXAM: 1 VIEW XRAY OF THE CHEST 05/25/2024 12:36:00 PM   COMPARISON: Prior radiograph dated 05/10/2024.   CLINICAL HISTORY: SOB (shortness of breath).   FINDINGS:   LINES, TUBES AND DEVICES: Tracheostomy above the carina in similar position.   LUNGS AND PLEURA: Shallow inspiration. Right lung base streaky atelectasis improved since the prior radiograph. Pneumonia is not excluded. No pulmonary edema. No pleural effusion. No pneumothorax.   HEART AND MEDIASTINUM: Stable mild cardiomegaly. No acute abnormality of the mediastinal silhouette.   BONES AND SOFT TISSUES: No acute osseous abnormality.   IMPRESSION: 1. Right lung base streaky atelectasis, improved; pneumonia not excluded.   Electronically signed by: Vanetta   The following medications and/or interventions were ordered/changed/discontinued as part of the Respiratory Treatment protocol:   Medication Changes: Budesonide Ordered   Airway Clearance Changes: Metaneb Ordered   Oxygen Therapy Changes:Cannula Ordered

## 2024-05-28 NOTE — Assessment & Plan Note (Signed)
 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 He was suppose to have his foley catheter changed out monthly. Will need to change it out tomorrow.

## 2024-05-28 NOTE — Plan of Care (Signed)
  Problem: Skin Integrity: Goal: Risk for impaired skin integrity will decrease Outcome: Progressing   Problem: Fluid Volume: Goal: Ability to maintain a balanced intake and output will improve Outcome: Progressing   Problem: Nutrition: Goal: Adequate nutrition will be maintained Outcome: Progressing   Problem: Pain Managment: Goal: General experience of comfort will improve and/or be controlled Outcome: Progressing   Problem: Safety: Goal: Ability to remain free from injury will improve Outcome: Progressing

## 2024-05-28 NOTE — Progress Notes (Signed)
 Pt placed on full vent settings to rest for the night

## 2024-05-28 NOTE — Subjective & Objective (Signed)
 Pt seen and examined. Took 4.5 hours to review his 214 days of hospitalization and summarize it in his hospital course tab.

## 2024-05-28 NOTE — Assessment & Plan Note (Signed)
 05/28/24 remains on trach collar during daytime and mechanical ventilation at night. PCCM has advised that pt is never to be decannulated again.  He has already been decannulated before(June 2025) and failed.

## 2024-05-28 NOTE — Assessment & Plan Note (Signed)
 05/28/24 continue with tube feeds.

## 2024-05-28 NOTE — Assessment & Plan Note (Signed)
 05/28/24 pt remains on trach collar during day and mechanical ventilation at night time.

## 2024-05-28 NOTE — Evaluation (Signed)
 RT Evaluate and Treat Note  05/28/2024   Breathing is (select one): Same as normal    The following was found on auscultation (select multiple):  Bilateral Breath Sounds: Clear;Diminished (05/28/24 0301)  R Upper  Breath Sounds: Clear (05/28/24 0301) L Upper Breath Sounds: Clear (05/28/24 0301) R Lower Breath Sounds: Diminished (05/28/24 0301) L Lower Breath Sounds: Diminished (05/28/24 0301)    Cough Assessment: Cough: Congested;Productive (05/27/24 2000)    Most Recent Chest Xray:... (No results found.    The following medications and/or interventions were ordered/changed/discontinued as part of the Respiratory Treatment protocol:   Medication Changes: None   Airway Clearance Changes:  None   Oxygen Therapy Changes: None

## 2024-05-29 DIAGNOSIS — I613 Nontraumatic intracerebral hemorrhage in brain stem: Secondary | ICD-10-CM | POA: Diagnosis not present

## 2024-05-29 LAB — CBC WITH DIFFERENTIAL/PLATELET
Abs Immature Granulocytes: 0.03 K/uL (ref 0.00–0.07)
Basophils Absolute: 0 K/uL (ref 0.0–0.1)
Basophils Relative: 0 %
Eosinophils Absolute: 0.2 K/uL (ref 0.0–0.5)
Eosinophils Relative: 2 %
HCT: 36 % — ABNORMAL LOW (ref 39.0–52.0)
Hemoglobin: 10.8 g/dL — ABNORMAL LOW (ref 13.0–17.0)
Immature Granulocytes: 0 %
Lymphocytes Relative: 17 %
Lymphs Abs: 1.6 K/uL (ref 0.7–4.0)
MCH: 27.3 pg (ref 26.0–34.0)
MCHC: 30 g/dL (ref 30.0–36.0)
MCV: 90.9 fL (ref 80.0–100.0)
Monocytes Absolute: 0.7 K/uL (ref 0.1–1.0)
Monocytes Relative: 7 %
Neutro Abs: 7 K/uL (ref 1.7–7.7)
Neutrophils Relative %: 74 %
Platelets: 359 K/uL (ref 150–400)
RBC: 3.96 MIL/uL — ABNORMAL LOW (ref 4.22–5.81)
RDW: 16.1 % — ABNORMAL HIGH (ref 11.5–15.5)
WBC: 9.5 K/uL (ref 4.0–10.5)
nRBC: 0 % (ref 0.0–0.2)

## 2024-05-29 LAB — COMPREHENSIVE METABOLIC PANEL WITH GFR
ALT: 79 U/L — ABNORMAL HIGH (ref 0–44)
AST: 34 U/L (ref 15–41)
Albumin: 2.7 g/dL — ABNORMAL LOW (ref 3.5–5.0)
Alkaline Phosphatase: 61 U/L (ref 38–126)
Anion gap: 10 (ref 5–15)
BUN: 24 mg/dL — ABNORMAL HIGH (ref 6–20)
CO2: 30 mmol/L (ref 22–32)
Calcium: 8.9 mg/dL (ref 8.9–10.3)
Chloride: 97 mmol/L — ABNORMAL LOW (ref 98–111)
Creatinine, Ser: 0.42 mg/dL — ABNORMAL LOW (ref 0.61–1.24)
GFR, Estimated: >60 mL/min (ref >60)
Glucose, Bld: 118 mg/dL — ABNORMAL HIGH (ref 70–99)
Potassium: 4.1 mmol/L (ref 3.5–5.1)
Sodium: 137 mmol/L (ref 135–145)
Total Bilirubin: 0.2 mg/dL (ref 0.0–1.2)
Total Protein: 7.2 g/dL (ref 6.5–8.1)

## 2024-05-29 LAB — GLUCOSE, CAPILLARY
Glucose-Capillary: 94 mg/dL (ref 70–99)
Glucose-Capillary: 124 mg/dL — ABNORMAL HIGH (ref 70–99)
Glucose-Capillary: 116 mg/dL — ABNORMAL HIGH (ref 70–99)
Glucose-Capillary: 123 mg/dL — ABNORMAL HIGH (ref 70–99)
Glucose-Capillary: 135 mg/dL — ABNORMAL HIGH (ref 70–99)
Glucose-Capillary: 123 mg/dL — ABNORMAL HIGH (ref 70–99)

## 2024-05-29 NOTE — Progress Notes (Signed)
 PROGRESS NOTE  Garrett [name]  DOB: Sep 26, 1975  PCP: Pcp, No FMW:[id number]  DOA: 10/27/2023  LOS: 215 days  Hospital Day: 216  Subjective: Patient was seen and examined morning Unfortunate middle-aged African-American male.  Lying on bed.  Opens eyes and touch.  Following commands for me.  In the last  24 hours, afebrile, hemodynamically stable with blood pressure in the low normal range. Labs this morning with no significant changes  Brief narrative: Garrett [name] is a 48 y.o. male with PMH significant for uncontrolled hypertension not on meds, smoking, marijuana use. October 27, 2023, patient was brought to the ED by EMS after he was found down with agonal breathing by his fiance, unknown downtime. EMS noted him with dried emesis over his face.   Bagging and brought him to the hospital. Initial blood pressure 262/156, emergently intubated UDS was positive for opioids, he was given several doses of Narcan  without improvement   CT head revealed an acute 4.1 cm ICH centered in the pons with intraventricular extension into the fourth ventricle and also extension into the basal cisterns. Basal cisterns and fourth ventricle are effaced without hydrocephalus. Trace additional subarachnoid hemorrhage was noted along the left parietal convexity    Was initially admitted to neuro ICU Started on Cleviprex  drip.  Also given hypertonic saline.  Repeat CT head did not show any change. Neurology discussed with family and gave an expectation of poor prognosis.  Palliative care was consulted In the next few days, patient opened eyes and was able to follow commands but only with his eyes.  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 3/22, tracheostomy was placed.  3/24, PEG tube was  placed. Patient could not be discharged because of Medicaid pending status.  Hospital course was prolonged because of failed weaning trial.  Course was complicated by recurrent ileus, episodes of infection/colonization of respiratory tract with MRSA, repeated transferred to ICU, trach and PEG dislodgment.  Patient had prolonged difficulty weaning from vent.  Patient was on trach collar for few days but later required ventilatory support mostly at nights and is currently in ICU. Currently waiting to hear from [facility].   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 Initially managed in neuro ICU Currently trach, PEG dependent. Mental status remains poor. Poor prognosis.  Family wants to keep him full code  Hypertensive emergency Has h/o uncontrolled hypertension. Initial blood pressure was over 260 systolic. Currently blood pressure is controlled with metoprolol  tartrate 75 mg twice daily amlodipine  10 mg daily, continue as needed hydralazine .   Acute on chronic resp failure w hypoxia and hypercarbia  Trach dependence MRSA, Pseudomonas with trach S/p tracheostomy redo 7/16.  Not a candidate for decannulation given ineffective airway clearance, Continue trach collar during day with vent support at night. He is on 8 lit of oxygen via trach collar.  Continue trach care per protocol. trach replaced last on 05/23/24.   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 Waylan evaluated and placed tarsorraphy.   8/29-ophtho rec: Erythromycin  eyedrop q2h, tape eyes with paper tape if not blinking. 9/14, ophtho, Dr. Leane reengaged due to worsening conjunctivitis and clouding recommended second antibiotic, gatifloxacin  4 times daily.per Optho, no epithelial defects noted. 9/25: courses completed of abx  drops; continue on opth ointment and cover left eye with patch and/or tape  to keep lid closed  9/29, more injected with discharge again in OS, resume 5 day course of Polytrim  eyedrops to left eye slowly improving.    Hypernatremia Resolved. Currently on free water  at 150 cc every 6 hours. Intermittently monitor labs Recent Labs  Lab 05/22/24 1514 05/29/24 1255  NA 138 137   Elevated liver enzymes:  Mild.  Unclear etiology.  CK normal. - check liver panel in am.  Recent Labs  Lab 05/29/24 1255  AST 34  ALT 79*  ALKPHOS 61  BILITOT 0.2  PROT 7.2  ALBUMIN  2.7*     Inadequate oral intake PEG replaced by IR on 05/15/24 after becoming dislodged again Dietitian consult appreciated.  Currently on tube feeding and supplements. Estimated body mass index is 31.17 kg/m as calculated from the following:   Height as of this encounter: 5' 8 (1.727 m).   Weight as of this encounter: 93 kg.       Mobility:  PT Orders:   PT Follow up Rec: Recommend Snf, Barriers To Snf Placement - Toc To F/U With Patient/Family For D/C Plans8/13/2025 1300   Goals of care   Code Status: Full Code     DVT prophylaxis:  enoxaparin  (LOVENOX ) injection 40 mg Start: 03/06/24 1000 SCDs Start: 10/27/23 2226   Antimicrobials: None currently Fluid: None currently Consultants: None Family Communication: None at bedside  Status: Inpatient Level of care:  ICU   Patient is from: Home Needs to continue in-hospital care: Difficult disposition      Diet:  Diet Order             Diet NPO time specified  Diet effective now                   Scheduled Meds:  amLODipine   10 mg Per Tube Daily   artificial tears   Both Eyes BID   Chlorhexidine  Gluconate Cloth  6 each Topical Daily   enoxaparin  (LOVENOX ) injection  40 mg Subcutaneous Daily   famotidine   20 mg Per Tube BID   feeding supplement (JEVITY 1.5 CAL/FIBER)  237 mL Per Tube 5 X Daily   feeding supplement (PROSource TF20)  60 mL Per Tube TID   free water   150 mL Per Tube Q6H   insulin  aspart  0-9 Units  Subcutaneous Q6H   metoprolol  tartrate  75 mg Per Tube BID   multivitamin with minerals  1 tablet Per Tube Daily   mupirocin  ointment  1 Application Nasal BID   mouth rinse  15 mL Mouth Rinse Q2H   polyethylene glycol  17 g Per Tube Daily    PRN meds: acetaminophen  **OR** acetaminophen  (TYLENOL ) oral liquid 160 mg/5 mL **OR** acetaminophen , bisacodyl , Gerhardt's butt cream, hydrALAZINE , ipratropium-albuterol , mouth rinse, traZODone    Infusions:    Antimicrobials: Anti-infectives (From admission, onward)    Start     Dose/Rate Route Frequency Ordered Stop   04/09/24 2200  linezolid  (ZYVOX ) tablet 600 mg  Status:  Discontinued        600 mg Per Tube Every 12 hours 04/09/24 1132 04/13/24 1332   04/09/24 1700  vancomycin  (VANCOREADY) IVPB 1500 mg/300 mL  Status:  Discontinued        1,500 mg 150 mL/hr over 120 Minutes Intravenous Every 12 hours 04/09/24 1108 04/09/24 1132   04/09/24 1000  linezolid  (ZYVOX ) IVPB 600 mg  Status:  Discontinued  600 mg 300 mL/hr over 60 Minutes Intravenous Every 12 hours 04/09/24 0527 04/09/24 1108   04/01/24 1145  ceFEPIme  (MAXIPIME ) 2 g in sodium chloride  0.9 % 100 mL IVPB        2 g 200 mL/hr over 30 Minutes Intravenous Every 8 hours 04/01/24 1137 04/14/24 2230   02/21/24 1000  vancomycin  (VANCOREADY) IVPB 1250 mg/250 mL        1,250 mg 166.7 mL/hr over 90 Minutes Intravenous Every 12 hours 02/20/24 2158 02/28/24 0130   02/16/24 1445  ceFEPIme  (MAXIPIME ) 2 g in sodium chloride  0.9 % 100 mL IVPB        2 g 200 mL/hr over 30 Minutes Intravenous Every 8 hours 02/16/24 1348 02/27/24 2322   02/16/24 0945  vancomycin  (VANCOREADY) IVPB 1500 mg/300 mL  Status:  Discontinued        1,500 mg 150 mL/hr over 120 Minutes Intravenous Every 12 hours 02/16/24 0848 02/20/24 2158   02/15/24 1215  Ampicillin -Sulbactam (UNASYN ) 3 g in sodium chloride  0.9 % 100 mL IVPB  Status:  Discontinued        3 g 200 mL/hr over 30 Minutes Intravenous Every 6 hours 02/15/24  1125 02/16/24 1226   01/24/24 2000  vancomycin  (VANCOREADY) IVPB 1250 mg/250 mL        1,250 mg 166.7 mL/hr over 90 Minutes Intravenous Every 24 hours 01/24/24 1154 01/27/24 0125   01/22/24 2200  Vancomycin  (VANCOCIN ) 1,250 mg in sodium chloride  0.9 % 250 mL IVPB  Status:  Discontinued        1,250 mg 166.7 mL/hr over 90 Minutes Intravenous Every 24 hours 01/22/24 1013 01/22/24 1020   01/22/24 2000  vancomycin  (VANCOREADY) IVPB 1250 mg/250 mL  Status:  Discontinued        1,250 mg 166.7 mL/hr over 90 Minutes Intravenous Every 24 hours 01/22/24 1020 01/24/24 1154   01/20/24 2000  ceFEPIme  (MAXIPIME ) 2 g in sodium chloride  0.9 % 100 mL IVPB  Status:  Discontinued        2 g 200 mL/hr over 30 Minutes Intravenous Every 8 hours 01/20/24 1900 01/24/24 1154   01/20/24 2000  Vancomycin  (VANCOCIN ) 1,250 mg in sodium chloride  0.9 % 250 mL IVPB  Status:  Discontinued        1,250 mg 166.7 mL/hr over 90 Minutes Intravenous Every 24 hours 01/20/24 1900 01/22/24 1013   01/03/24 1730  cefTRIAXone  (ROCEPHIN ) 1 g in sodium chloride  0.9 % 100 mL IVPB        1 g 200 mL/hr over 30 Minutes Intravenous Every 24 hours 01/03/24 1235 01/07/24 0710   01/02/24 1830  ceFEPIme  (MAXIPIME ) 2 g in sodium chloride  0.9 % 100 mL IVPB  Status:  Discontinued        2 g 200 mL/hr over 30 Minutes Intravenous Every 8 hours 01/02/24 1732 01/03/24 1235   01/02/24 1830  Vancomycin  (VANCOCIN ) 1,250 mg in sodium chloride  0.9 % 250 mL IVPB  Status:  Discontinued        1,250 mg 166.7 mL/hr over 90 Minutes Intravenous Every 24 hours 01/02/24 1732 01/04/24 1754   01/01/24 1200  cefTRIAXone  (ROCEPHIN ) 1 g in sodium chloride  0.9 % 100 mL IVPB  Status:  Discontinued        1 g 200 mL/hr over 30 Minutes Intravenous Every 24 hours 01/01/24 1057 01/02/24 1713   11/29/23 2200  linezolid  (ZYVOX ) tablet 600 mg        600 mg Per Tube Every 12 hours 11/29/23 1034  12/04/23 2215   11/28/23 1000  linezolid  (ZYVOX ) IVPB 600 mg  Status:   Discontinued        600 mg 300 mL/hr over 60 Minutes Intravenous Every 12 hours 11/28/23 0831 11/29/23 1034   11/23/23 1200  vancomycin  (VANCOREADY) IVPB 1250 mg/250 mL  Status:  Discontinued        1,250 mg 166.7 mL/hr over 90 Minutes Intravenous Every 24 hours 11/23/23 1036 11/28/23 0831   11/20/23 1100  vancomycin  (VANCOREADY) IVPB 1250 mg/250 mL  Status:  Discontinued        1,250 mg 166.7 mL/hr over 90 Minutes Intravenous Every 24 hours 11/20/23 0923 11/20/23 0923   11/20/23 1100  vancomycin  (VANCOREADY) IVPB 1250 mg/250 mL  Status:  Discontinued        1,250 mg 166.7 mL/hr over 90 Minutes Intravenous Every 24 hours 11/20/23 0923 11/23/23 1036   11/15/23 2300  vancomycin  (VANCOREADY) IVPB 1250 mg/250 mL        1,250 mg 166.7 mL/hr over 90 Minutes Intravenous Every 24 hours 11/15/23 2215 11/19/23 0924   11/12/23 2200  vancomycin  (VANCOREADY) IVPB 750 mg/150 mL  Status:  Discontinued        750 mg 150 mL/hr over 60 Minutes Intravenous Every 12 hours 11/12/23 0855 11/15/23 2215   11/12/23 0930  vancomycin  (VANCOREADY) IVPB 2000 mg/400 mL        2,000 mg 200 mL/hr over 120 Minutes Intravenous  Once 11/12/23 0838 11/12/23 1116   11/11/23 1345  Ampicillin -Sulbactam (UNASYN ) 3 g in sodium chloride  0.9 % 100 mL IVPB  Status:  Discontinued        3 g 200 mL/hr over 30 Minutes Intravenous Every 6 hours 11/11/23 1257 11/12/23 0836   10/28/23 0400  Ampicillin -Sulbactam (UNASYN ) 3 g in sodium chloride  0.9 % 100 mL IVPB  Status:  Discontinued        3 g 200 mL/hr over 30 Minutes Intravenous Every 6 hours 10/27/23 2151 11/02/23 0945   10/27/23 1845  vancomycin  (VANCOCIN ) IVPB 1000 mg/200 mL premix  Status:  Discontinued        1,000 mg 200 mL/hr over 60 Minutes Intravenous  Once 10/27/23 1843 10/27/23 1843   10/27/23 1845  ceFEPIme  (MAXIPIME ) 2 g in sodium chloride  0.9 % 100 mL IVPB        2 g 200 mL/hr over 30 Minutes Intravenous  Once 10/27/23 1843 10/27/23 2024   10/27/23 1845  vancomycin   (VANCOREADY) IVPB 2000 mg/400 mL        2,000 mg 200 mL/hr over 120 Minutes Intravenous  Once 10/27/23 1843 10/27/23 2123       Objective: Vitals:   05/29/24 1244 05/29/24 1300  BP: 115/82 114/84  Pulse: 82 76  Resp: (!) 25 (!) 28  Temp:    SpO2:  93%    Intake/Output Summary (Last 24 hours) at 05/29/2024 1445 Last data filed at 05/29/2024 1200 Gross per 24 hour  Intake 600 ml  Output 1065 ml  Net -465 ml   Filed Weights   05/27/24 0500 05/28/24 0500 05/29/24 0500  Weight: 92 kg 96 kg 93 kg   Weight change: -3 kg Body mass index is 31.17 kg/m.   Physical Exam: General exam: Middle-aged African-American male.  On trach and vent Skin: No rashes, lesions or ulcers. HEENT: Atraumatic, normocephalic, no obvious bleeding Lungs: Clear to auscultation bilaterally,  CVS: S1, S2, no murmur,   GI/Abd: Soft, nontender, nondistended, bowel sound present,   CNS: Opens eyes on command.  Unable to follow any other command for me Psychiatry: Unable to examine Extremities: No pedal edema, no calf tenderness,   Data Review: I have personally reviewed the laboratory data and studies available.  F/u labs ordered Unresulted Labs (From admission, onward)     Start     Ordered   04/10/24 0429  CBC with Differential/Platelet  ONCE - STAT,   STAT       Question:  Specimen collection method  Answer:  Lab=Lab collect   04/10/24 0429            Signed, Chapman Rota, MD [facility] 05/29/2024

## 2024-05-29 NOTE — Plan of Care (Signed)
  Problem: Nutrition: Goal: Risk of aspiration will decrease Outcome: Progressing   Problem: Fluid Volume: Goal: Ability to maintain a balanced intake and output will improve Outcome: Progressing   Problem: Skin Integrity: Goal: Risk for impaired skin integrity will decrease Outcome: Progressing

## 2024-05-29 NOTE — Plan of Care (Signed)
  Problem: Intracerebral Hemorrhage Tissue Perfusion: Goal: Complications of Intracerebral Hemorrhage will be minimized 05/29/2024 1846 by Delila Charlanne DASEN, RN Outcome: Progressing 05/29/2024 1710 by Delila Charlanne DASEN, RN Outcome: Progressing

## 2024-05-30 DIAGNOSIS — I613 Nontraumatic intracerebral hemorrhage in brain stem: Secondary | ICD-10-CM | POA: Diagnosis not present

## 2024-05-30 LAB — GLUCOSE, CAPILLARY
Glucose-Capillary: 96 mg/dL (ref 70–99)
Glucose-Capillary: 118 mg/dL — ABNORMAL HIGH (ref 70–99)
Glucose-Capillary: 150 mg/dL — ABNORMAL HIGH (ref 70–99)

## 2024-05-30 NOTE — Progress Notes (Signed)
 PROGRESS NOTE  Garrett [name]  DOB: Jan 10, 1976  PCP: Pcp, No FMW:[id number]  DOA: 10/27/2023  LOS: 216 days  Hospital Day: 217  Subjective: Patient is on ventilator.  He is in a persistent vegetative state.    Brief narrative: Garrett [name] is a 48 y.o. male with PMH significant for uncontrolled hypertension not on meds, smoking, marijuana use. October 27, 2023, patient was brought to the ED by EMS after he was found down with agonal breathing by his fiance, unknown downtime. EMS noted him with dried emesis over his face.   Bagging and brought him to the hospital. Initial blood pressure 262/156, emergently intubated UDS was positive for opioids, he was given several doses of Narcan  without improvement   CT head revealed an acute 4.1 cm ICH centered in the pons with intraventricular extension into the fourth ventricle and also extension into the basal cisterns. Basal cisterns and fourth ventricle are effaced without hydrocephalus. Trace additional subarachnoid hemorrhage was noted along the left parietal convexity    Was initially admitted to neuro ICU. Started on Cleviprex  drip.  Also given hypertonic saline.  Repeat CT head did not show any change. Neurology discussed with family and gave an expectation of poor prognosis. Palliative care was consulted. In the next few days, patient opened eyes and was able to follow commands but only with his eyes.  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 3/22, tracheostomy was placed.  3/24, PEG tube was placed. Patient could not be discharged because of Medicaid pending status.  Hospital course was prolonged because of failed weaning trial.  Course was complicated by recurrent ileus, episodes of infection/colonization of respiratory  tract with MRSA, repeated transferred to ICU, trach and PEG dislodgment.  Patient had prolonged difficulty weaning from vent.  Patient was on trach collar for few days but later required ventilatory support mostly at nights and is currently in ICU.   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 Initially managed in neuro ICU Currently trach, PEG dependent. Mental status remains poor. Poor prognosis.  Family wants to keep him full code  Hypertensive emergency Has h/o uncontrolled hypertension. Initial blood pressure was over 260 systolic. Currently blood pressure is controlled with metoprolol  tartrate 75 mg twice daily amlodipine  10 mg daily, continue as needed hydralazine .   Acute on chronic resp failure w hypoxia and hypercarbia  Trach dependence MRSA, Pseudomonas with trach S/p tracheostomy redo 7/16.  Not a candidate for decannulation given ineffective airway clearance. Continue trach collar during day with vent support at night. He is on 8 lit of oxygen via trach collar.  Continue trach care per protocol. Trach replaced last on 05/23/24.   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 Waylan evaluated and placed tarsorraphy.   8/29-ophtho rec: Erythromycin  eyedrop q2h, tape eyes with paper tape if not blinking. 9/14, ophtho, Dr. Leane reengaged due to worsening conjunctivitis and clouding recommended second antibiotic, gatifloxacin  4 times daily.per Optho, no epithelial defects noted. 9/25: courses completed of abx drops; continue on opth ointment and cover left eye with patch and/or tape to keep lid closed  9/29, more injected with discharge again in OS, given 5 day course of Polytrim  eyedrops to left eye.    Hypernatremia Resolved.  Currently on free water  at 150 cc every 6 hours. Intermittently monitor labs Recent Labs  Lab 05/29/24 1255  NA 137    Elevated liver enzymes:  Mild.  Unclear etiology.  CK normal. Check periodically.  Labs on 10/9 only showed mildly elevated ALT.   Inadequate oral intake PEG replaced by IR on 05/15/24 after becoming dislodged again Dietitian consult appreciated.  Currently on tube feeding and supplements. Estimated body mass index is 31.17 kg/m as calculated from the following:   Height as of this encounter: 5' 8 (1.727 m).   Weight as of this encounter: 93 kg.       Mobility:  PT Orders:   PT Follow up Rec: Recommend Snf, Barriers To Snf Placement - Toc To F/U With Patient/Family For D/C Plans8/13/2025 1300   Goals of care   Code Status: Full Code     DVT prophylaxis:  enoxaparin  (LOVENOX ) injection 40 mg Start: 03/06/24 1000 SCDs Start: 10/27/23 2226   Antimicrobials: None currently Fluid: None currently Consultants: None Family Communication: None at bedside  Status: Inpatient Level of care:  ICU   Patient is from: Home Needs to continue in-hospital care: Difficult disposition   Scheduled Meds:  amLODipine   10 mg Per Tube Daily   artificial tears   Both Eyes BID   Chlorhexidine  Gluconate Cloth  6 each Topical Daily   enoxaparin  (LOVENOX ) injection  40 mg Subcutaneous Daily   famotidine   20 mg Per Tube BID   feeding supplement (JEVITY 1.5 CAL/FIBER)  237 mL Per Tube 5 X Daily   feeding supplement (PROSource TF20)  60 mL Per Tube TID   free water   150 mL Per Tube Q6H   insulin  aspart  0-9 Units Subcutaneous Q6H   metoprolol  tartrate  75 mg Per Tube BID   multivitamin with minerals  1 tablet Per Tube Daily   mupirocin  ointment  1 Application Nasal BID   mouth rinse  15 mL Mouth Rinse Q2H   polyethylene glycol  17 g Per Tube Daily    PRN meds: acetaminophen  **OR** acetaminophen  (TYLENOL ) oral liquid 160 mg/5 mL **OR** acetaminophen , bisacodyl , Gerhardt's butt cream, hydrALAZINE , ipratropium-albuterol , mouth rinse, traZODone    Infusions:     Antimicrobials: Anti-infectives (From admission, onward)    Start     Dose/Rate Route Frequency Ordered Stop   04/09/24 2200  linezolid  (ZYVOX ) tablet 600 mg  Status:  Discontinued        600 mg Per Tube Every 12 hours 04/09/24 1132 04/13/24 1332   04/09/24 1700  vancomycin  (VANCOREADY) IVPB 1500 mg/300 mL  Status:  Discontinued        1,500 mg 150 mL/hr over 120 Minutes Intravenous Every 12 hours 04/09/24 1108 04/09/24 1132   04/09/24 1000  linezolid  (ZYVOX ) IVPB 600 mg  Status:  Discontinued        600 mg 300 mL/hr over 60 Minutes Intravenous Every 12 hours 04/09/24 0527 04/09/24 1108   04/01/24 1145  ceFEPIme  (MAXIPIME ) 2 g in sodium chloride  0.9 % 100 mL IVPB        2 g 200 mL/hr over 30 Minutes Intravenous Every 8 hours 04/01/24 1137 04/14/24 2230   02/21/24 1000  vancomycin  (VANCOREADY) IVPB 1250 mg/250 mL        1,250 mg 166.7 mL/hr over 90 Minutes Intravenous Every 12 hours 02/20/24 2158 02/28/24 0130   02/16/24 1445  ceFEPIme  (MAXIPIME ) 2 g in sodium chloride  0.9 % 100 mL IVPB        2  g 200 mL/hr over 30 Minutes Intravenous Every 8 hours 02/16/24 1348 02/27/24 2322   02/16/24 0945  vancomycin  (VANCOREADY) IVPB 1500 mg/300 mL  Status:  Discontinued        1,500 mg 150 mL/hr over 120 Minutes Intravenous Every 12 hours 02/16/24 0848 02/20/24 2158   02/15/24 1215  Ampicillin -Sulbactam (UNASYN ) 3 g in sodium chloride  0.9 % 100 mL IVPB  Status:  Discontinued        3 g 200 mL/hr over 30 Minutes Intravenous Every 6 hours 02/15/24 1125 02/16/24 1226   01/24/24 2000  vancomycin  (VANCOREADY) IVPB 1250 mg/250 mL        1,250 mg 166.7 mL/hr over 90 Minutes Intravenous Every 24 hours 01/24/24 1154 01/27/24 0125   01/22/24 2200  Vancomycin  (VANCOCIN ) 1,250 mg in sodium chloride  0.9 % 250 mL IVPB  Status:  Discontinued        1,250 mg 166.7 mL/hr over 90 Minutes Intravenous Every 24 hours 01/22/24 1013 01/22/24 1020   01/22/24 2000  vancomycin  (VANCOREADY) IVPB 1250 mg/250 mL   Status:  Discontinued        1,250 mg 166.7 mL/hr over 90 Minutes Intravenous Every 24 hours 01/22/24 1020 01/24/24 1154   01/20/24 2000  ceFEPIme  (MAXIPIME ) 2 g in sodium chloride  0.9 % 100 mL IVPB  Status:  Discontinued        2 g 200 mL/hr over 30 Minutes Intravenous Every 8 hours 01/20/24 1900 01/24/24 1154   01/20/24 2000  Vancomycin  (VANCOCIN ) 1,250 mg in sodium chloride  0.9 % 250 mL IVPB  Status:  Discontinued        1,250 mg 166.7 mL/hr over 90 Minutes Intravenous Every 24 hours 01/20/24 1900 01/22/24 1013   01/03/24 1730  cefTRIAXone  (ROCEPHIN ) 1 g in sodium chloride  0.9 % 100 mL IVPB        1 g 200 mL/hr over 30 Minutes Intravenous Every 24 hours 01/03/24 1235 01/07/24 0710   01/02/24 1830  ceFEPIme  (MAXIPIME ) 2 g in sodium chloride  0.9 % 100 mL IVPB  Status:  Discontinued        2 g 200 mL/hr over 30 Minutes Intravenous Every 8 hours 01/02/24 1732 01/03/24 1235   01/02/24 1830  Vancomycin  (VANCOCIN ) 1,250 mg in sodium chloride  0.9 % 250 mL IVPB  Status:  Discontinued        1,250 mg 166.7 mL/hr over 90 Minutes Intravenous Every 24 hours 01/02/24 1732 01/04/24 1754   01/01/24 1200  cefTRIAXone  (ROCEPHIN ) 1 g in sodium chloride  0.9 % 100 mL IVPB  Status:  Discontinued        1 g 200 mL/hr over 30 Minutes Intravenous Every 24 hours 01/01/24 1057 01/02/24 1713   11/29/23 2200  linezolid  (ZYVOX ) tablet 600 mg        600 mg Per Tube Every 12 hours 11/29/23 1034 12/04/23 2215   11/28/23 1000  linezolid  (ZYVOX ) IVPB 600 mg  Status:  Discontinued        600 mg 300 mL/hr over 60 Minutes Intravenous Every 12 hours 11/28/23 0831 11/29/23 1034   11/23/23 1200  vancomycin  (VANCOREADY) IVPB 1250 mg/250 mL  Status:  Discontinued        1,250 mg 166.7 mL/hr over 90 Minutes Intravenous Every 24 hours 11/23/23 1036 11/28/23 0831   11/20/23 1100  vancomycin  (VANCOREADY) IVPB 1250 mg/250 mL  Status:  Discontinued        1,250 mg 166.7 mL/hr over 90 Minutes Intravenous Every 24 hours 11/20/23  0923 11/20/23  9076   11/20/23 1100  vancomycin  (VANCOREADY) IVPB 1250 mg/250 mL  Status:  Discontinued        1,250 mg 166.7 mL/hr over 90 Minutes Intravenous Every 24 hours 11/20/23 0923 11/23/23 1036   11/15/23 2300  vancomycin  (VANCOREADY) IVPB 1250 mg/250 mL        1,250 mg 166.7 mL/hr over 90 Minutes Intravenous Every 24 hours 11/15/23 2215 11/19/23 0924   11/12/23 2200  vancomycin  (VANCOREADY) IVPB 750 mg/150 mL  Status:  Discontinued        750 mg 150 mL/hr over 60 Minutes Intravenous Every 12 hours 11/12/23 0855 11/15/23 2215   11/12/23 0930  vancomycin  (VANCOREADY) IVPB 2000 mg/400 mL        2,000 mg 200 mL/hr over 120 Minutes Intravenous  Once 11/12/23 0838 11/12/23 1116   11/11/23 1345  Ampicillin -Sulbactam (UNASYN ) 3 g in sodium chloride  0.9 % 100 mL IVPB  Status:  Discontinued        3 g 200 mL/hr over 30 Minutes Intravenous Every 6 hours 11/11/23 1257 11/12/23 0836   10/28/23 0400  Ampicillin -Sulbactam (UNASYN ) 3 g in sodium chloride  0.9 % 100 mL IVPB  Status:  Discontinued        3 g 200 mL/hr over 30 Minutes Intravenous Every 6 hours 10/27/23 2151 11/02/23 0945   10/27/23 1845  vancomycin  (VANCOCIN ) IVPB 1000 mg/200 mL premix  Status:  Discontinued        1,000 mg 200 mL/hr over 60 Minutes Intravenous  Once 10/27/23 1843 10/27/23 1843   10/27/23 1845  ceFEPIme  (MAXIPIME ) 2 g in sodium chloride  0.9 % 100 mL IVPB        2 g 200 mL/hr over 30 Minutes Intravenous  Once 10/27/23 1843 10/27/23 2024   10/27/23 1845  vancomycin  (VANCOREADY) IVPB 2000 mg/400 mL        2,000 mg 200 mL/hr over 120 Minutes Intravenous  Once 10/27/23 1843 10/27/23 2123       Objective: Vitals:   05/30/24 0700 05/30/24 0847  BP: 109/79   Pulse: 72   Resp: 12   Temp:  100 F (37.8 C)  SpO2: 99%     Intake/Output Summary (Last 24 hours) at 05/30/2024 0928 Last data filed at 05/30/2024 0600 Gross per 24 hour  Intake 600 ml  Output 1050 ml  Net -450 ml   Filed Weights   05/28/24 0500  05/29/24 0500 05/30/24 0406  Weight: 96 kg 93 kg 93 kg   Weight change: 0 kg Body mass index is 31.17 kg/m.   Physical Exam:  General appearance: Opens eyes occasionally. Resp: Coarse breath sounds bilaterally. Cardio: S1-S2 is normal regular.  No S3-S4.  No rubs murmurs or bruit GI: Abdomen is soft.  PEG tube is noted.  Abdomen is nontender. Extremities: No edema.  Full range of motion of lower extremities.    Signed, Joette Pebbles, MD [facility] 05/30/2024

## 2024-05-30 NOTE — Evaluation (Signed)
 RT Evaluate and Treat Note  05/30/2024   Breathing is (select one): Same as normal    The following was found on auscultation (select multiple):  Bilateral Breath Sounds: Diminished;Clear (05/30/24 0858)  R Upper  Breath Sounds: Diminished;Clear (05/30/24 0339) L Upper Breath Sounds: Diminished;Clear (05/30/24 0339) R Lower Breath Sounds: Diminished (05/30/24 0339) L Lower Breath Sounds: Diminished (05/30/24 0339)    Cough Assessment: Cough: Productive;Congested (05/30/24 0858)    Most Recent Chest Xray:... (No results found.    The following medications and/or interventions were ordered/changed/discontinued as part of the Respiratory Treatment protocol:   Medication Changes: None   Airway Clearance Changes: None   Oxygen Therapy Changes: None

## 2024-05-30 NOTE — TOC Progression Note (Signed)
 Transition of Care [facility]) - Progression Note    Patient Details  Name: Garrett [name] MRN: [id number] Date of Birth: 02/16/1976  [facility] CM/SW Contact  Lendia Dais, [place] Phone Number: 05/30/2024, 2:11 PM  Clinical Narrative:  Barriers of placement still remain.   Pt now has LTC medicaid. CSW called [facility], who previously said in the HUB they can take the patient once LTC medicaid is approved. CSW called [facility] and left a VM for Effie in admission.  CSW spoke to [place] at [facility] and [facility] stated that they are at capacity and does not have an estimate of bed availability.   TOC will continue to follow.    Expected Discharge Plan: Long Term Nursing Home Barriers to Discharge: Continued Medical Work up, SNF Pending Medicaid, SNF Pending bed offer, SNF Pending payor source - LOG, Inadequate or no insurance (New trach)               Expected Discharge Plan and Services In-house Referral: Clinical Social Work, Hospice / Palliative Care   Post Acute Care Choice: Skilled Nursing Facility Living arrangements for the past 2 months: Single Family Home                                       Social Drivers of Health (SDOH) Interventions SDOH Screenings   Food Insecurity: Patient Unable To Answer (10/29/2023)  Social Connections: Unknown (06/23/2023)   Received from [facility]  Tobacco Use: High Risk (10/31/2023)    Readmission Risk Interventions     No data to display

## 2024-05-31 DIAGNOSIS — I613 Nontraumatic intracerebral hemorrhage in brain stem: Secondary | ICD-10-CM | POA: Diagnosis not present

## 2024-05-31 NOTE — Progress Notes (Signed)
 PROGRESS NOTE  Garrett [name]  DOB: 10/21/1975  PCP: Pcp, No FMW:[id number]  DOA: 10/27/2023  LOS: 217 days  Hospital Day: 218  Subjective: Patient is on ventilator.  He is in a persistent vegetative state.  No acute issues per nursing staff.  Brief narrative: Garrett [name] is a 48 y.o. male with PMH significant for uncontrolled hypertension not on meds, smoking, marijuana use. October 27, 2023, patient was brought to the ED by EMS after he was found down with agonal breathing by his fiance, unknown downtime. EMS noted him with dried emesis over his face.   Bagging and brought him to the hospital. Initial blood pressure 262/156, emergently intubated UDS was positive for opioids, he was given several doses of Narcan  without improvement   CT head revealed an acute 4.1 cm ICH centered in the pons with intraventricular extension into the fourth ventricle and also extension into the basal cisterns. Basal cisterns and fourth ventricle are effaced without hydrocephalus. Trace additional subarachnoid hemorrhage was noted along the left parietal convexity    Was initially admitted to neuro ICU. Started on Cleviprex  drip.  Also given hypertonic saline.  Repeat CT head did not show any change. Neurology discussed with family and gave an expectation of poor prognosis. Palliative care was consulted. In the next few days, patient opened eyes and was able to follow commands but only with his eyes.  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 3/22, tracheostomy was placed.  3/24, PEG tube was placed. Patient could not be discharged because of Medicaid pending status.  Hospital course was prolonged because of failed weaning trial.  Course was complicated by recurrent ileus, episodes of  infection/colonization of respiratory tract with MRSA, repeated transferred to ICU, trach and PEG dislodgment.  Patient had prolonged difficulty weaning from vent.  Patient was on trach collar for few days but later required ventilatory support mostly at nights and is currently in ICU.   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 Initially managed in neuro ICU Currently trach, PEG dependent. Mental status remains poor. Poor prognosis.  Family wants to keep him full code  Hypertensive emergency Has h/o uncontrolled hypertension. Initial blood pressure was over 260 systolic. Currently blood pressure is controlled with metoprolol  tartrate 75 mg twice daily amlodipine  10 mg daily, continue as needed hydralazine .   Acute on chronic resp failure w hypoxia and hypercarbia  Trach dependence MRSA, Pseudomonas with trach S/p tracheostomy redo 7/16.  Not a candidate for decannulation given ineffective airway clearance. Continue trach collar during day with vent support at night. He is on 8 lit of oxygen via trach collar.  Continue trach care per protocol. Trach replaced last on 05/23/24.   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 Waylan evaluated and placed tarsorraphy.   8/29-ophtho rec: Erythromycin  eyedrop q2h, tape eyes with paper tape if not blinking. 9/14, ophtho, Dr. Leane reengaged due to worsening conjunctivitis and clouding recommended second antibiotic, gatifloxacin  4 times daily.per Optho, no epithelial defects noted. 9/25: courses completed of abx drops; continue on opth ointment and cover left eye with patch and/or tape to keep lid closed  9/29, more injected with discharge again in OS, given 5 day course of Polytrim  eyedrops to left eye.  Hypernatremia Resolved. Currently on free water  at 150 cc every 6 hours. Intermittently monitor labs Recent  Labs  Lab 05/29/24 1255  NA 137   Elevated liver enzymes:  Mild.  Unclear etiology.  CK normal. Check periodically.  Labs on 10/9 only showed mildly elevated ALT.   Inadequate oral intake PEG replaced by IR on 05/15/24 after becoming dislodged again Dietitian consult appreciated.  Currently on tube feeding and supplements. Estimated body mass index is 31.24 kg/m as calculated from the following:   Height as of this encounter: 5' 8 (1.727 m).   Weight as of this encounter: 93.2 kg.       Mobility:  PT Orders:   PT Follow up Rec: Recommend Snf, Barriers To Snf Placement - Toc To F/U With Patient/Family For D/C Plans8/13/2025 1300   Goals of care   Code Status: Full Code     DVT prophylaxis:  enoxaparin  (LOVENOX ) injection 40 mg Start: 03/06/24 1000 SCDs Start: 10/27/23 2226   Antimicrobials: None currently Fluid: None currently Consultants: None Family Communication: None at bedside  Status: Inpatient Level of care:  ICU   Patient is from: Home Needs to continue in-hospital care: Difficult disposition   Scheduled Meds:  amLODipine   10 mg Per Tube Daily   artificial tears   Both Eyes BID   Chlorhexidine  Gluconate Cloth  6 each Topical Daily   enoxaparin  (LOVENOX ) injection  40 mg Subcutaneous Daily   famotidine   20 mg Per Tube BID   feeding supplement (JEVITY 1.5 CAL/FIBER)  237 mL Per Tube 5 X Daily   feeding supplement (PROSource TF20)  60 mL Per Tube TID   free water   150 mL Per Tube Q6H   insulin  aspart  0-9 Units Subcutaneous Q6H   metoprolol  tartrate  75 mg Per Tube BID   multivitamin with minerals  1 tablet Per Tube Daily   mouth rinse  15 mL Mouth Rinse Q2H   polyethylene glycol  17 g Per Tube Daily    PRN meds: acetaminophen  **OR** acetaminophen  (TYLENOL ) oral liquid 160 mg/5 mL **OR** acetaminophen , bisacodyl , Gerhardt's butt cream, hydrALAZINE , ipratropium-albuterol , mouth rinse, traZODone    Infusions:    Antimicrobials: Anti-infectives  (From admission, onward)    Start     Dose/Rate Route Frequency Ordered Stop   04/09/24 2200  linezolid  (ZYVOX ) tablet 600 mg  Status:  Discontinued        600 mg Per Tube Every 12 hours 04/09/24 1132 04/13/24 1332   04/09/24 1700  vancomycin  (VANCOREADY) IVPB 1500 mg/300 mL  Status:  Discontinued        1,500 mg 150 mL/hr over 120 Minutes Intravenous Every 12 hours 04/09/24 1108 04/09/24 1132   04/09/24 1000  linezolid  (ZYVOX ) IVPB 600 mg  Status:  Discontinued        600 mg 300 mL/hr over 60 Minutes Intravenous Every 12 hours 04/09/24 0527 04/09/24 1108   04/01/24 1145  ceFEPIme  (MAXIPIME ) 2 g in sodium chloride  0.9 % 100 mL IVPB        2 g 200 mL/hr over 30 Minutes Intravenous Every 8 hours 04/01/24 1137 04/14/24 2230   02/21/24 1000  vancomycin  (VANCOREADY) IVPB 1250 mg/250 mL        1,250 mg 166.7 mL/hr over 90 Minutes Intravenous Every 12 hours 02/20/24 2158 02/28/24 0130   02/16/24 1445  ceFEPIme  (MAXIPIME ) 2 g in sodium chloride  0.9 % 100 mL IVPB        2 g 200 mL/hr over 30 Minutes Intravenous  Every 8 hours 02/16/24 1348 02/27/24 2322   02/16/24 0945  vancomycin  (VANCOREADY) IVPB 1500 mg/300 mL  Status:  Discontinued        1,500 mg 150 mL/hr over 120 Minutes Intravenous Every 12 hours 02/16/24 0848 02/20/24 2158   02/15/24 1215  Ampicillin -Sulbactam (UNASYN ) 3 g in sodium chloride  0.9 % 100 mL IVPB  Status:  Discontinued        3 g 200 mL/hr over 30 Minutes Intravenous Every 6 hours 02/15/24 1125 02/16/24 1226   01/24/24 2000  vancomycin  (VANCOREADY) IVPB 1250 mg/250 mL        1,250 mg 166.7 mL/hr over 90 Minutes Intravenous Every 24 hours 01/24/24 1154 01/27/24 0125   01/22/24 2200  Vancomycin  (VANCOCIN ) 1,250 mg in sodium chloride  0.9 % 250 mL IVPB  Status:  Discontinued        1,250 mg 166.7 mL/hr over 90 Minutes Intravenous Every 24 hours 01/22/24 1013 01/22/24 1020   01/22/24 2000  vancomycin  (VANCOREADY) IVPB 1250 mg/250 mL  Status:  Discontinued        1,250  mg 166.7 mL/hr over 90 Minutes Intravenous Every 24 hours 01/22/24 1020 01/24/24 1154   01/20/24 2000  ceFEPIme  (MAXIPIME ) 2 g in sodium chloride  0.9 % 100 mL IVPB  Status:  Discontinued        2 g 200 mL/hr over 30 Minutes Intravenous Every 8 hours 01/20/24 1900 01/24/24 1154   01/20/24 2000  Vancomycin  (VANCOCIN ) 1,250 mg in sodium chloride  0.9 % 250 mL IVPB  Status:  Discontinued        1,250 mg 166.7 mL/hr over 90 Minutes Intravenous Every 24 hours 01/20/24 1900 01/22/24 1013   01/03/24 1730  cefTRIAXone  (ROCEPHIN ) 1 g in sodium chloride  0.9 % 100 mL IVPB        1 g 200 mL/hr over 30 Minutes Intravenous Every 24 hours 01/03/24 1235 01/07/24 0710   01/02/24 1830  ceFEPIme  (MAXIPIME ) 2 g in sodium chloride  0.9 % 100 mL IVPB  Status:  Discontinued        2 g 200 mL/hr over 30 Minutes Intravenous Every 8 hours 01/02/24 1732 01/03/24 1235   01/02/24 1830  Vancomycin  (VANCOCIN ) 1,250 mg in sodium chloride  0.9 % 250 mL IVPB  Status:  Discontinued        1,250 mg 166.7 mL/hr over 90 Minutes Intravenous Every 24 hours 01/02/24 1732 01/04/24 1754   01/01/24 1200  cefTRIAXone  (ROCEPHIN ) 1 g in sodium chloride  0.9 % 100 mL IVPB  Status:  Discontinued        1 g 200 mL/hr over 30 Minutes Intravenous Every 24 hours 01/01/24 1057 01/02/24 1713   11/29/23 2200  linezolid  (ZYVOX ) tablet 600 mg        600 mg Per Tube Every 12 hours 11/29/23 1034 12/04/23 2215   11/28/23 1000  linezolid  (ZYVOX ) IVPB 600 mg  Status:  Discontinued        600 mg 300 mL/hr over 60 Minutes Intravenous Every 12 hours 11/28/23 0831 11/29/23 1034   11/23/23 1200  vancomycin  (VANCOREADY) IVPB 1250 mg/250 mL  Status:  Discontinued        1,250 mg 166.7 mL/hr over 90 Minutes Intravenous Every 24 hours 11/23/23 1036 11/28/23 0831   11/20/23 1100  vancomycin  (VANCOREADY) IVPB 1250 mg/250 mL  Status:  Discontinued        1,250 mg 166.7 mL/hr over 90 Minutes Intravenous Every 24 hours 11/20/23 0923 11/20/23 0923   11/20/23 1100   vancomycin  (  VANCOREADY) IVPB 1250 mg/250 mL  Status:  Discontinued        1,250 mg 166.7 mL/hr over 90 Minutes Intravenous Every 24 hours 11/20/23 0923 11/23/23 1036   11/15/23 2300  vancomycin  (VANCOREADY) IVPB 1250 mg/250 mL        1,250 mg 166.7 mL/hr over 90 Minutes Intravenous Every 24 hours 11/15/23 2215 11/19/23 0924   11/12/23 2200  vancomycin  (VANCOREADY) IVPB 750 mg/150 mL  Status:  Discontinued        750 mg 150 mL/hr over 60 Minutes Intravenous Every 12 hours 11/12/23 0855 11/15/23 2215   11/12/23 0930  vancomycin  (VANCOREADY) IVPB 2000 mg/400 mL        2,000 mg 200 mL/hr over 120 Minutes Intravenous  Once 11/12/23 0838 11/12/23 1116   11/11/23 1345  Ampicillin -Sulbactam (UNASYN ) 3 g in sodium chloride  0.9 % 100 mL IVPB  Status:  Discontinued        3 g 200 mL/hr over 30 Minutes Intravenous Every 6 hours 11/11/23 1257 11/12/23 0836   10/28/23 0400  Ampicillin -Sulbactam (UNASYN ) 3 g in sodium chloride  0.9 % 100 mL IVPB  Status:  Discontinued        3 g 200 mL/hr over 30 Minutes Intravenous Every 6 hours 10/27/23 2151 11/02/23 0945   10/27/23 1845  vancomycin  (VANCOCIN ) IVPB 1000 mg/200 mL premix  Status:  Discontinued        1,000 mg 200 mL/hr over 60 Minutes Intravenous  Once 10/27/23 1843 10/27/23 1843   10/27/23 1845  ceFEPIme  (MAXIPIME ) 2 g in sodium chloride  0.9 % 100 mL IVPB        2 g 200 mL/hr over 30 Minutes Intravenous  Once 10/27/23 1843 10/27/23 2024   10/27/23 1845  vancomycin  (VANCOREADY) IVPB 2000 mg/400 mL        2,000 mg 200 mL/hr over 120 Minutes Intravenous  Once 10/27/23 1843 10/27/23 2123       Objective: Vitals:   05/31/24 0817 05/31/24 0900  BP:  116/83  Pulse: 81 90  Resp: 20 (!) 33  Temp:    SpO2: 99% 91%    Intake/Output Summary (Last 24 hours) at 05/31/2024 1005 Last data filed at 05/31/2024 0900 Gross per 24 hour  Intake 600 ml  Output 1200 ml  Net -600 ml   Filed Weights   05/29/24 0500 05/30/24 0406 05/31/24 0500  Weight: 93  kg 93 kg 93.2 kg   Weight change: 0.2 kg Body mass index is 31.24 kg/m.   Physical Exam:  Noted to be lying on the bed with his eyes open this morning.  A laptop was opened in front of him.  Does not appear to be in any discomfort or distress. Tracheostomy noted.  PEG tube is noted.  He also has a Foley catheter.   Signed, Joette Pebbles, MD [facility] 05/31/2024

## 2024-05-31 NOTE — Plan of Care (Signed)
  Problem: Intracerebral Hemorrhage Tissue Perfusion: Goal: Complications of Intracerebral Hemorrhage will be minimized Outcome: Progressing   Problem: Nutrition: Goal: Risk of aspiration will decrease Outcome: Progressing Goal: Dietary intake will improve Outcome: Progressing   Problem: Education: Goal: Knowledge of disease or condition will improve Outcome: Progressing

## 2024-05-31 NOTE — Plan of Care (Signed)
 Problem: Intracerebral Hemorrhage Tissue Perfusion: Goal: Complications of Intracerebral Hemorrhage will be minimized 05/31/2024 0555 by Siri Michaelis, RN Outcome: Progressing 05/31/2024 0014 by Siri Michaelis, RN Outcome: Progressing   Problem: Nutrition: Goal: Risk of aspiration will decrease 05/31/2024 0555 by Siri Michaelis, RN Outcome: Progressing 05/31/2024 0014 by Siri Michaelis, RN Outcome: Progressing Goal: Dietary intake will improve 05/31/2024 0555 by Siri Michaelis, RN Outcome: Progressing 05/31/2024 0014 by Siri Michaelis, RN Outcome: Progressing   Problem: Fluid Volume: Goal: Ability to maintain a balanced intake and output will improve 05/31/2024 0555 by Siri Michaelis, RN Outcome: Progressing 05/31/2024 0014 by Siri Michaelis, RN Outcome: Progressing   Problem: Skin Integrity: Goal: Risk for impaired skin integrity will decrease 05/31/2024 0555 by Siri Michaelis, RN Outcome: Progressing 05/31/2024 0014 by Siri Michaelis, RN Outcome: Progressing   Problem: Tissue Perfusion: Goal: Adequacy of tissue perfusion will improve 05/31/2024 0555 by Siri Michaelis, RN Outcome: Progressing 05/31/2024 0014 by Siri Michaelis, RN Outcome: Progressing   Problem: Clinical Measurements: Goal: Ability to maintain clinical measurements within normal limits will improve 05/31/2024 0555 by Siri Michaelis, RN Outcome: Progressing 05/31/2024 0014 by Siri Michaelis, RN Outcome: Progressing Goal: Will remain free from infection 05/31/2024 0555 by Siri Michaelis, RN Outcome: Progressing 05/31/2024 0014 by Siri Michaelis, RN Outcome: Progressing Goal: Diagnostic test results will improve 05/31/2024 0555 by Siri Michaelis, RN Outcome: Progressing 05/31/2024 0014 by Siri Michaelis, RN Outcome: Progressing Goal: Respiratory complications will improve 05/31/2024 0555  by Siri Michaelis, RN Outcome: Progressing 05/31/2024 0014 by Siri Michaelis, RN Outcome: Progressing Goal: Cardiovascular complication will be avoided 05/31/2024 0555 by Siri Michaelis, RN Outcome: Progressing 05/31/2024 0014 by Siri Michaelis, RN Outcome: Progressing   Problem: Activity: Goal: Risk for activity intolerance will decrease 05/31/2024 0555 by Siri Michaelis, RN Outcome: Progressing 05/31/2024 0014 by Siri Michaelis, RN Outcome: Progressing   Problem: Nutrition: Goal: Adequate nutrition will be maintained 05/31/2024 0555 by Siri Michaelis, RN Outcome: Progressing 05/31/2024 0014 by Siri Michaelis, RN Outcome: Progressing   Problem: Elimination: Goal: Will not experience complications related to bowel motility 05/31/2024 0555 by Siri Michaelis, RN Outcome: Progressing 05/31/2024 0014 by Siri Michaelis, RN Outcome: Progressing Goal: Will not experience complications related to urinary retention 05/31/2024 0555 by Siri Michaelis, RN Outcome: Progressing 05/31/2024 0014 by Siri Michaelis, RN Outcome: Progressing   Problem: Pain Managment: Goal: General experience of comfort will improve and/or be controlled 05/31/2024 0555 by Siri Michaelis, RN Outcome: Progressing 05/31/2024 0014 by Siri Michaelis, RN Outcome: Progressing   Problem: Safety: Goal: Ability to remain free from injury will improve 05/31/2024 0555 by Siri Michaelis, RN Outcome: Progressing 05/31/2024 0014 by Siri Michaelis, RN Outcome: Progressing   Problem: Skin Integrity: Goal: Risk for impaired skin integrity will decrease 05/31/2024 0555 by Siri Michaelis, RN Outcome: Progressing 05/31/2024 0014 by Siri Michaelis, RN Outcome: Progressing   Problem: Activity: Goal: Ability to tolerate increased activity will improve 05/31/2024 0555 by Siri Michaelis, RN Outcome:  Progressing 05/31/2024 0014 by Siri Michaelis, RN Outcome: Progressing   Problem: Respiratory: Goal: Patent airway maintenance will improve 05/31/2024 0555 by Siri Michaelis, RN Outcome: Progressing 05/31/2024 0014 by Siri Michaelis, RN Outcome: Progressing   Problem: Activity: Goal: Ability to tolerate increased activity will improve 05/31/2024 0555 by Siri Michaelis, RN Outcome: Progressing 05/31/2024 0014 by Siri Michaelis, RN Outcome: Progressing   Problem: Respiratory: Goal: Ability to maintain a clear airway and adequate ventilation will improve 05/31/2024 0555 by Siri Michaelis, RN Outcome: Progressing 05/31/2024 0014 by Siri Michaelis, RN Outcome: Progressing   Problem: Role  Relationship: Goal: Method of communication will improve 05/31/2024 0555 by Siri Michaelis, RN Outcome: Progressing 05/31/2024 0014 by Siri Michaelis, RN Outcome: Progressing   Problem: Education: Goal: Knowledge of disease or condition will improve 05/31/2024 0555 by Siri Michaelis, RN Outcome: Progressing 05/31/2024 0014 by Siri Michaelis, RN Outcome: Progressing   Problem: Intracerebral Hemorrhage Tissue Perfusion: Goal: Complications of Intracerebral Hemorrhage will be minimized 05/31/2024 0555 by Siri Michaelis, RN Outcome: Progressing 05/31/2024 0014 by Siri Michaelis, RN Outcome: Progressing   Problem: Self-Care: Goal: Verbalization of feelings and concerns over difficulty with self-care will improve 05/31/2024 0555 by Siri Michaelis, RN Outcome: Progressing 05/31/2024 0014 by Siri Michaelis, RN Outcome: Progressing Goal: Ability to communicate needs accurately will improve 05/31/2024 0555 by Siri Michaelis, RN Outcome: Progressing 05/31/2024 0014 by Siri Michaelis, RN Outcome: Progressing   Problem: Nutrition: Goal: Risk of aspiration will decrease 05/31/2024 0555 by  Siri Michaelis, RN Outcome: Progressing 05/31/2024 0014 by Siri Michaelis, RN Outcome: Progressing Goal: Dietary intake will improve 05/31/2024 0555 by Siri Michaelis, RN Outcome: Progressing 05/31/2024 0014 by Siri Michaelis, RN Outcome: Progressing   Problem: Education: Goal: Knowledge of disease or condition will improve 05/31/2024 0555 by Siri Michaelis, RN Outcome: Progressing 05/31/2024 0014 by Siri Michaelis, RN Outcome: Progressing Goal: Knowledge of secondary prevention will improve (MUST DOCUMENT ALL) 05/31/2024 0555 by Siri Michaelis, RN Outcome: Progressing 05/31/2024 0014 by Siri Michaelis, RN Outcome: Progressing Goal: Knowledge of patient specific risk factors will improve (DELETE if not current risk factor) 05/31/2024 0555 by Siri Michaelis, RN Outcome: Progressing 05/31/2024 0014 by Siri Michaelis, RN Outcome: Progressing   Problem: Intracerebral Hemorrhage Tissue Perfusion: Goal: Complications of Intracerebral Hemorrhage will be minimized 05/31/2024 0555 by Siri Michaelis, RN Outcome: Progressing 05/31/2024 0014 by Siri Michaelis, RN Outcome: Progressing   Problem: Coping: Goal: Will verbalize positive feelings about self 05/31/2024 0555 by Siri Michaelis, RN Outcome: Progressing 05/31/2024 0014 by Siri Michaelis, RN Outcome: Progressing Goal: Will identify appropriate support needs 05/31/2024 0555 by Siri Michaelis, RN Outcome: Progressing 05/31/2024 0014 by Siri Michaelis, RN Outcome: Progressing   Problem: Health Behavior/Discharge Planning: Goal: Ability to manage health-related needs will improve 05/31/2024 0555 by Siri Michaelis, RN Outcome: Progressing 05/31/2024 0014 by Siri Michaelis, RN Outcome: Progressing Goal: Goals will be collaboratively established with patient/family 05/31/2024 0555 by Siri Michaelis, RN Outcome:  Progressing 05/31/2024 0014 by Siri Michaelis, RN Outcome: Progressing   Problem: Self-Care: Goal: Ability to participate in self-care as condition permits will improve 05/31/2024 0555 by Siri Michaelis, RN Outcome: Progressing 05/31/2024 0014 by Siri Michaelis, RN Outcome: Progressing Goal: Verbalization of feelings and concerns over difficulty with self-care will improve 05/31/2024 0555 by Siri Michaelis, RN Outcome: Progressing 05/31/2024 0014 by Siri Michaelis, RN Outcome: Progressing Goal: Ability to communicate needs accurately will improve 05/31/2024 0555 by Siri Michaelis, RN Outcome: Progressing 05/31/2024 0014 by Siri Michaelis, RN Outcome: Progressing   Problem: Nutrition: Goal: Risk of aspiration will decrease 05/31/2024 0555 by Siri Michaelis, RN Outcome: Progressing 05/31/2024 0014 by Siri Michaelis, RN Outcome: Progressing Goal: Dietary intake will improve 05/31/2024 0555 by Siri Michaelis, RN Outcome: Progressing 05/31/2024 0014 by Siri Michaelis, RN Outcome: Progressing

## 2024-05-31 NOTE — Evaluation (Signed)
 RT Evaluate and Treat Note  05/31/2024   Breathing is (select one): Same as normal    The following was found on auscultation (select multiple):  Bilateral Breath Sounds: Diminished (05/31/24 1116)  R Upper  Breath Sounds: Clear;Diminished (05/31/24 0333) L Upper Breath Sounds: Clear;Diminished (05/31/24 0333) R Lower Breath Sounds: Diminished (05/31/24 0333) L Lower Breath Sounds: Diminished (05/31/24 0333)    Cough Assessment: Cough: Productive (05/31/24 1116)    Most Recent Chest Xray:... (No results found.    The following medications and/or interventions were ordered/changed/discontinued as part of the Respiratory Treatment protocol:   Medication Changes: none   Airway Clearance Changes: none   Oxygen Therapy Changes:none

## 2024-06-01 DIAGNOSIS — I613 Nontraumatic intracerebral hemorrhage in brain stem: Secondary | ICD-10-CM | POA: Diagnosis not present

## 2024-06-01 NOTE — Plan of Care (Signed)
  Problem: Nutrition: Goal: Risk of aspiration will decrease Outcome: Progressing   Problem: Clinical Measurements: Goal: Will remain free from infection Outcome: Progressing Goal: Diagnostic test results will improve Outcome: Progressing Goal: Cardiovascular complication will be avoided Outcome: Progressing   Problem: Clinical Measurements: Goal: Diagnostic test results will improve Outcome: Progressing   Problem: Clinical Measurements: Goal: Cardiovascular complication will be avoided Outcome: Progressing

## 2024-06-01 NOTE — Evaluation (Signed)
 RT Evaluate and Treat Note  06/01/2024   Breathing is (select one): Same as normal    The following was found on auscultation (select multiple):  Bilateral Breath Sounds: Diminished;Clear (06/01/24 0819)  R Upper  Breath Sounds: Clear;Diminished (06/01/24 0318) L Upper Breath Sounds: Clear;Diminished (06/01/24 0318) R Lower Breath Sounds: Diminished (06/01/24 0318) L Lower Breath Sounds: Diminished (06/01/24 0318)    Cough Assessment: Cough: Productive (06/01/24 0819)    Most Recent Chest Xray:... (No results found.    The following medications and/or interventions were ordered/changed/discontinued as part of the Respiratory Treatment protocol:   Medication Changes: none   Airway Clearance Changes: none   Oxygen Therapy Changes:none

## 2024-06-01 NOTE — Progress Notes (Signed)
 PROGRESS NOTE  Garrett [name]  DOB: Apr 18, 1976  PCP: Pcp, No FMW:[id number]  DOA: 10/27/2023  LOS: 218 days  Hospital Day: 219  Subjective: Patient is on ventilator.  He is in a persistent vegetative state.  No acute issues per nursing staff.  Brief narrative: Garrett [name] is a 48 y.o. male with PMH significant for uncontrolled hypertension not on meds, smoking, marijuana use. October 27, 2023, patient was brought to the ED by EMS after he was found down with agonal breathing by his fiance, unknown downtime. EMS noted him with dried emesis over his face.   Bagging and brought him to the hospital. Initial blood pressure 262/156, emergently intubated UDS was positive for opioids, he was given several doses of Narcan  without improvement   CT head revealed an acute 4.1 cm ICH centered in the pons with intraventricular extension into the fourth ventricle and also extension into the basal cisterns. Basal cisterns and fourth ventricle are effaced without hydrocephalus. Trace additional subarachnoid hemorrhage was noted along the left parietal convexity    Was initially admitted to neuro ICU. Started on Cleviprex  drip.  Also given hypertonic saline.  Repeat CT head did not show any change. Neurology discussed with family and gave an expectation of poor prognosis. Palliative care was consulted. In the next few days, patient opened eyes and was able to follow commands but only with his eyes.  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 3/22, tracheostomy was placed.  3/24, PEG tube was placed. Patient could not be discharged because of Medicaid pending status.  Hospital course was prolonged because of failed weaning trial.  Course was complicated by recurrent ileus, episodes of  infection/colonization of respiratory tract with MRSA, repeated transferred to ICU, trach and PEG dislodgment.  Patient had prolonged difficulty weaning from vent.  Patient was on trach collar for few days but later required ventilatory support mostly at nights and is currently in ICU.   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 Initially managed in neuro ICU Currently trach, PEG dependent. Mental status remains poor. Poor prognosis.  Family wants to keep him full code  Hypertensive emergency Has h/o uncontrolled hypertension. Initial blood pressure was over 260 systolic. Currently blood pressure is controlled with metoprolol  tartrate 75 mg twice daily amlodipine  10 mg daily, continue as needed hydralazine .   Acute on chronic resp failure w hypoxia and hypercarbia  Trach dependence MRSA, Pseudomonas with trach S/p tracheostomy redo 7/16.  Not a candidate for decannulation given ineffective airway clearance. Continue trach collar during day with vent support at night. He is on 8 lit of oxygen via trach collar.  Continue trach care per protocol. Trach replaced last on 05/23/24.   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 Waylan evaluated and placed tarsorraphy.   8/29-ophtho rec: Erythromycin  eyedrop q2h, tape eyes with paper tape if not blinking. 9/14, ophtho, Dr. Leane reengaged due to worsening conjunctivitis and clouding recommended second antibiotic, gatifloxacin  4 times daily.per Optho, no epithelial defects noted. 9/25: courses completed of abx drops; continue on opth ointment and cover left eye with patch and/or tape to keep lid closed  9/29, more injected with discharge again in OS, given 5 day course of Polytrim  eyedrops to left eye.  Left eye remains stable.  Discussed with nursing staff who mentioned that it is not worse compared to last week.    Hypernatremia Resolved. Currently on free water  at 150 cc every 6 hours. Intermittently monitor labs Recent Labs  Lab 05/29/24 1255  NA 137   Elevated liver enzymes:  Mild.  Unclear etiology.  CK normal. Check periodically.  Labs on 10/9 only showed mildly elevated ALT.   Inadequate oral intake PEG replaced by IR on 05/15/24 after becoming dislodged again Dietitian consult appreciated.  Currently on tube feeding and supplements. Estimated body mass index is 31.24 kg/m as calculated from the following:   Height as of this encounter: 5' 8 (1.727 m).   Weight as of this encounter: 93.2 kg.       Mobility:  PT Orders:   PT Follow up Rec: Recommend Snf, Barriers To Snf Placement - Toc To F/U With Patient/Family For D/C Plans8/13/2025 1300   Goals of care   Code Status: Full Code     DVT prophylaxis:  enoxaparin  (LOVENOX ) injection 40 mg Start: 03/06/24 1000 SCDs Start: 10/27/23 2226   Antimicrobials: None currently Fluid: None currently Consultants: None Family Communication: None at bedside  Status: Inpatient Level of care:  ICU   Patient is from: Home Needs to continue in-hospital care: Difficult disposition   Scheduled Meds:  amLODipine   10 mg Per Tube Daily   artificial tears   Both Eyes BID   Chlorhexidine  Gluconate Cloth  6 each Topical Daily   enoxaparin  (LOVENOX ) injection  40 mg Subcutaneous Daily   famotidine   20 mg Per Tube BID   feeding supplement (JEVITY 1.5 CAL/FIBER)  237 mL Per Tube 5 X Daily   feeding supplement (PROSource TF20)  60 mL Per Tube TID   free water   150 mL Per Tube Q6H   insulin  aspart  0-9 Units Subcutaneous Q6H   metoprolol  tartrate  75 mg Per Tube BID   multivitamin with minerals  1 tablet Per Tube Daily   mouth rinse  15 mL Mouth Rinse Q2H   polyethylene glycol  17 g Per Tube Daily    PRN meds: acetaminophen  **OR** acetaminophen  (TYLENOL ) oral liquid 160 mg/5 mL **OR** acetaminophen , bisacodyl , Gerhardt's butt cream,  hydrALAZINE , ipratropium-albuterol , mouth rinse, traZODone    Infusions:    Antimicrobials: Anti-infectives (From admission, onward)    Start     Dose/Rate Route Frequency Ordered Stop   04/09/24 2200  linezolid  (ZYVOX ) tablet 600 mg  Status:  Discontinued        600 mg Per Tube Every 12 hours 04/09/24 1132 04/13/24 1332   04/09/24 1700  vancomycin  (VANCOREADY) IVPB 1500 mg/300 mL  Status:  Discontinued        1,500 mg 150 mL/hr over 120 Minutes Intravenous Every 12 hours 04/09/24 1108 04/09/24 1132   04/09/24 1000  linezolid  (ZYVOX ) IVPB 600 mg  Status:  Discontinued        600 mg 300 mL/hr over 60 Minutes Intravenous Every 12 hours 04/09/24 0527 04/09/24 1108   04/01/24 1145  ceFEPIme  (MAXIPIME ) 2 g in sodium chloride  0.9 % 100 mL IVPB        2 g 200 mL/hr over 30 Minutes Intravenous Every 8 hours 04/01/24 1137 04/14/24 2230   02/21/24 1000  vancomycin  (VANCOREADY) IVPB 1250 mg/250 mL        1,250 mg 166.7 mL/hr over 90 Minutes Intravenous Every 12 hours 02/20/24 2158 02/28/24 0130   02/16/24 1445  ceFEPIme  (MAXIPIME ) 2 g in  sodium chloride  0.9 % 100 mL IVPB        2 g 200 mL/hr over 30 Minutes Intravenous Every 8 hours 02/16/24 1348 02/27/24 2322   02/16/24 0945  vancomycin  (VANCOREADY) IVPB 1500 mg/300 mL  Status:  Discontinued        1,500 mg 150 mL/hr over 120 Minutes Intravenous Every 12 hours 02/16/24 0848 02/20/24 2158   02/15/24 1215  Ampicillin -Sulbactam (UNASYN ) 3 g in sodium chloride  0.9 % 100 mL IVPB  Status:  Discontinued        3 g 200 mL/hr over 30 Minutes Intravenous Every 6 hours 02/15/24 1125 02/16/24 1226   01/24/24 2000  vancomycin  (VANCOREADY) IVPB 1250 mg/250 mL        1,250 mg 166.7 mL/hr over 90 Minutes Intravenous Every 24 hours 01/24/24 1154 01/27/24 0125   01/22/24 2200  Vancomycin  (VANCOCIN ) 1,250 mg in sodium chloride  0.9 % 250 mL IVPB  Status:  Discontinued        1,250 mg 166.7 mL/hr over 90 Minutes Intravenous Every 24 hours 01/22/24 1013 01/22/24  1020   01/22/24 2000  vancomycin  (VANCOREADY) IVPB 1250 mg/250 mL  Status:  Discontinued        1,250 mg 166.7 mL/hr over 90 Minutes Intravenous Every 24 hours 01/22/24 1020 01/24/24 1154   01/20/24 2000  ceFEPIme  (MAXIPIME ) 2 g in sodium chloride  0.9 % 100 mL IVPB  Status:  Discontinued        2 g 200 mL/hr over 30 Minutes Intravenous Every 8 hours 01/20/24 1900 01/24/24 1154   01/20/24 2000  Vancomycin  (VANCOCIN ) 1,250 mg in sodium chloride  0.9 % 250 mL IVPB  Status:  Discontinued        1,250 mg 166.7 mL/hr over 90 Minutes Intravenous Every 24 hours 01/20/24 1900 01/22/24 1013   01/03/24 1730  cefTRIAXone  (ROCEPHIN ) 1 g in sodium chloride  0.9 % 100 mL IVPB        1 g 200 mL/hr over 30 Minutes Intravenous Every 24 hours 01/03/24 1235 01/07/24 0710   01/02/24 1830  ceFEPIme  (MAXIPIME ) 2 g in sodium chloride  0.9 % 100 mL IVPB  Status:  Discontinued        2 g 200 mL/hr over 30 Minutes Intravenous Every 8 hours 01/02/24 1732 01/03/24 1235   01/02/24 1830  Vancomycin  (VANCOCIN ) 1,250 mg in sodium chloride  0.9 % 250 mL IVPB  Status:  Discontinued        1,250 mg 166.7 mL/hr over 90 Minutes Intravenous Every 24 hours 01/02/24 1732 01/04/24 1754   01/01/24 1200  cefTRIAXone  (ROCEPHIN ) 1 g in sodium chloride  0.9 % 100 mL IVPB  Status:  Discontinued        1 g 200 mL/hr over 30 Minutes Intravenous Every 24 hours 01/01/24 1057 01/02/24 1713   11/29/23 2200  linezolid  (ZYVOX ) tablet 600 mg        600 mg Per Tube Every 12 hours 11/29/23 1034 12/04/23 2215   11/28/23 1000  linezolid  (ZYVOX ) IVPB 600 mg  Status:  Discontinued        600 mg 300 mL/hr over 60 Minutes Intravenous Every 12 hours 11/28/23 0831 11/29/23 1034   11/23/23 1200  vancomycin  (VANCOREADY) IVPB 1250 mg/250 mL  Status:  Discontinued        1,250 mg 166.7 mL/hr over 90 Minutes Intravenous Every 24 hours 11/23/23 1036 11/28/23 0831   11/20/23 1100  vancomycin  (VANCOREADY) IVPB 1250 mg/250 mL  Status:  Discontinued  1,250  mg 166.7 mL/hr over 90 Minutes Intravenous Every 24 hours 11/20/23 0923 11/20/23 0923   11/20/23 1100  vancomycin  (VANCOREADY) IVPB 1250 mg/250 mL  Status:  Discontinued        1,250 mg 166.7 mL/hr over 90 Minutes Intravenous Every 24 hours 11/20/23 0923 11/23/23 1036   11/15/23 2300  vancomycin  (VANCOREADY) IVPB 1250 mg/250 mL        1,250 mg 166.7 mL/hr over 90 Minutes Intravenous Every 24 hours 11/15/23 2215 11/19/23 0924   11/12/23 2200  vancomycin  (VANCOREADY) IVPB 750 mg/150 mL  Status:  Discontinued        750 mg 150 mL/hr over 60 Minutes Intravenous Every 12 hours 11/12/23 0855 11/15/23 2215   11/12/23 0930  vancomycin  (VANCOREADY) IVPB 2000 mg/400 mL        2,000 mg 200 mL/hr over 120 Minutes Intravenous  Once 11/12/23 0838 11/12/23 1116   11/11/23 1345  Ampicillin -Sulbactam (UNASYN ) 3 g in sodium chloride  0.9 % 100 mL IVPB  Status:  Discontinued        3 g 200 mL/hr over 30 Minutes Intravenous Every 6 hours 11/11/23 1257 11/12/23 0836   10/28/23 0400  Ampicillin -Sulbactam (UNASYN ) 3 g in sodium chloride  0.9 % 100 mL IVPB  Status:  Discontinued        3 g 200 mL/hr over 30 Minutes Intravenous Every 6 hours 10/27/23 2151 11/02/23 0945   10/27/23 1845  vancomycin  (VANCOCIN ) IVPB 1000 mg/200 mL premix  Status:  Discontinued        1,000 mg 200 mL/hr over 60 Minutes Intravenous  Once 10/27/23 1843 10/27/23 1843   10/27/23 1845  ceFEPIme  (MAXIPIME ) 2 g in sodium chloride  0.9 % 100 mL IVPB        2 g 200 mL/hr over 30 Minutes Intravenous  Once 10/27/23 1843 10/27/23 2024   10/27/23 1845  vancomycin  (VANCOREADY) IVPB 2000 mg/400 mL        2,000 mg 200 mL/hr over 120 Minutes Intravenous  Once 10/27/23 1843 10/27/23 2123       Objective: Vitals:   06/01/24 0729 06/01/24 0819  BP:    Pulse:  61  Resp:  (!) 22  Temp: 97.7 F (36.5 C)   SpO2:  100%    Intake/Output Summary (Last 24 hours) at 06/01/2024 0900 Last data filed at 06/01/2024 0700 Gross per 24 hour  Intake 2181  ml  Output 1250 ml  Net 931 ml   Filed Weights   05/29/24 0500 05/30/24 0406 05/31/24 0500  Weight: 93 kg 93 kg 93.2 kg   Weight change:  Body mass index is 31.24 kg/m.   Physical Exam: Lying in the bed with eyes closed.  Tracheostomy noted.  Left eye is injected but stable according to nursing staff. He has a PEG tube.  Also has a Foley catheter. Lungs are clear to auscultation bilaterally. Abdomen soft.  Nontender nondistended.  Signed, Joette Pebbles, MD [facility] 06/01/2024

## 2024-06-02 ENCOUNTER — Inpatient Hospital Stay (HOSPITAL_COMMUNITY)

## 2024-06-02 DIAGNOSIS — I613 Nontraumatic intracerebral hemorrhage in brain stem: Secondary | ICD-10-CM | POA: Diagnosis not present

## 2024-06-02 DIAGNOSIS — Z930 Tracheostomy status: Secondary | ICD-10-CM | POA: Diagnosis not present

## 2024-06-02 DIAGNOSIS — J9621 Acute and chronic respiratory failure with hypoxia: Secondary | ICD-10-CM | POA: Diagnosis not present

## 2024-06-02 DIAGNOSIS — J9622 Acute and chronic respiratory failure with hypercapnia: Secondary | ICD-10-CM | POA: Diagnosis not present

## 2024-06-02 LAB — GLUCOSE, CAPILLARY
Glucose-Capillary: 100 mg/dL — ABNORMAL HIGH (ref 70–99)
Glucose-Capillary: 110 mg/dL — ABNORMAL HIGH (ref 70–99)
Glucose-Capillary: 111 mg/dL — ABNORMAL HIGH (ref 70–99)
Glucose-Capillary: 125 mg/dL — ABNORMAL HIGH (ref 70–99)
Glucose-Capillary: 134 mg/dL — ABNORMAL HIGH (ref 70–99)
Glucose-Capillary: 135 mg/dL — ABNORMAL HIGH (ref 70–99)
Glucose-Capillary: 141 mg/dL — ABNORMAL HIGH (ref 70–99)
Glucose-Capillary: 145 mg/dL — ABNORMAL HIGH (ref 70–99)
Glucose-Capillary: 150 mg/dL — ABNORMAL HIGH (ref 70–99)
Glucose-Capillary: 154 mg/dL — ABNORMAL HIGH (ref 70–99)
Glucose-Capillary: 155 mg/dL — ABNORMAL HIGH (ref 70–99)
Glucose-Capillary: 87 mg/dL (ref 70–99)
Glucose-Capillary: 89 mg/dL (ref 70–99)
Glucose-Capillary: 94 mg/dL (ref 70–99)
Glucose-Capillary: 79 mg/dL (ref 70–99)
Glucose-Capillary: 138 mg/dL — ABNORMAL HIGH (ref 70–99)

## 2024-06-02 NOTE — Plan of Care (Signed)
  Problem: Intracerebral Hemorrhage Tissue Perfusion: Goal: Complications of Intracerebral Hemorrhage will be minimized Outcome: Progressing   Problem: Nutrition: Goal: Risk of aspiration will decrease Outcome: Progressing Goal: Dietary intake will improve Outcome: Progressing   Problem: Education: Goal: Knowledge of disease or condition will improve Outcome: Progressing   Problem: Intracerebral Hemorrhage Tissue Perfusion: Goal: Complications of Intracerebral Hemorrhage will be minimized Outcome: Progressing   Problem: Self-Care: Goal: Verbalization of feelings and concerns over difficulty with self-care will improve Outcome: Progressing Goal: Ability to communicate needs accurately will improve Outcome: Progressing   Problem: Nutrition: Goal: Risk of aspiration will decrease Outcome: Progressing Goal: Dietary intake will improve Outcome: Progressing   Problem: Education: Goal: Knowledge of disease or condition will improve Outcome: Progressing Goal: Knowledge of secondary prevention will improve (MUST DOCUMENT ALL) Outcome: Progressing Goal: Knowledge of patient specific risk factors will improve (DELETE if not current risk factor) Outcome: Progressing   Problem: Coping: Goal: Will verbalize positive feelings about self Outcome: Progressing Goal: Will identify appropriate support needs Outcome: Progressing   Problem: Self-Care: Goal: Ability to participate in self-care as condition permits will improve Outcome: Progressing Goal: Verbalization of feelings and concerns over difficulty with self-care will improve Outcome: Progressing Goal: Ability to communicate needs accurately will improve Outcome: Progressing

## 2024-06-02 NOTE — Evaluation (Signed)
 RT Evaluate and Treat Note  06/02/2024   Breathing is (select one): Same as normal    The following was found on auscultation (select multiple):  Bilateral Breath Sounds: Diminished (06/02/24 0820)  R Upper  Breath Sounds: Diminished (06/02/24 0820) L Upper Breath Sounds: Diminished (06/02/24 0820) R Lower Breath Sounds: Diminished (06/02/24 0820) L Lower Breath Sounds: Diminished (06/02/24 0820)    Cough Assessment: Cough: Productive;Congested (06/02/24 0820)    Most Recent Chest Xray:... (No results found.    The following medications and/or interventions were ordered/changed/discontinued as part of the Respiratory Treatment protocol:   Medication Changes: None   Airway Clearance Changes: None   Oxygen Therapy Changes: None

## 2024-06-02 NOTE — TOC Progression Note (Signed)
 Transition of Care [facility]) - Progression Note    Patient Details  Name: Garrett [name] MRN: [id number] Date of Birth: Sep 22, 1975  [facility] CM/SW Contact  Lendia Dais, [place] Phone Number: 06/02/2024, 1:37 PM  Clinical Narrative:  CSW spoke to [place] [facility] in [place]. [place] stated that they would not wean ventilators at [facility] and the patient would have to be permanently on the vent. CSW spoke to MD and Pulmonary NP via secure chat. NP stated that the pt is not eligible for decannulation at this time.  [place] stated understanding. Pt's referral is pending review by respiratory therapist per [place].  CSW will continue to monitor.     Expected Discharge Plan: Long Term Nursing Home Barriers to Discharge: Continued Medical Work up, SNF Pending Medicaid, SNF Pending bed offer, SNF Pending payor source - LOG, Inadequate or no insurance (New trach)               Expected Discharge Plan and Services In-house Referral: Clinical Social Work, Hospice / Palliative Care   Post Acute Care Choice: Skilled Nursing Facility Living arrangements for the past 2 months: Single Family Home                                       Social Drivers of Health (SDOH) Interventions SDOH Screenings   Food Insecurity: Patient Unable To Answer (10/29/2023)  Social Connections: Unknown (06/23/2023)   Received from [facility]  Tobacco Use: High Risk (10/31/2023)    Readmission Risk Interventions     No data to display

## 2024-06-02 NOTE — Progress Notes (Addendum)
 NAME:  Garrett [name], MRN:  [id number], DOB:  [birth year], LOS: 219 ADMISSION DATE:  10/27/2023, CONSULTATION DATE:  10/27/2023 REFERRING MD: Regalado - TRH, CHIEF COMPLAINT:  Found down   History of Present Illness:  48 y/o man who was found down at home. PMHx significant for HTN.  Patient was down for an unknown amount of time, found by his fiance when she came home from work, reportedly with agonal breathing. Patient had driven her to work the morning of admission.  He has HTN but never checks his BP and does not follow with MD. Patient's fiance states that she can tell when his BP is really high; his eyes get blood shot and he is sweating. No h/o seizures. Besides almost daily marijuana and cigarette smoking, she is not aware of any other drug use. Drug screen positive for opioids and he was given several doses of Narcan . 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 BP in ED 262/156. CXR revealed RUL and perihilar infiltrates suggesting aspiration pneumonia. ED labs also revealed PH 7.169, LA 4.4, CPK 985. Patient was intubated in ED.  PCCM consulted pulmonary evaluation s/p tracheostomy.  Pertinent Medical History:  Hypertension  Significant Hospital Events: Including procedures, antibiotic start and stop dates in addition to other pertinent events   3/08 - Intubated in ED, pontine bleed/SAH. CT head 17:09 Acute large hemorrhage 4.1 cm in pons with intraventricular extension without hydrocephalus 3/09 - CTA 03:14 AM No change in IPH in pons, similar biparietal [facility] 3/14 - Now awake, appears locked in can follow commands with eyes. 3/16 - Purposeful with left upper extremity  3/22 - Tracheostomy placed at bedside, bleeding issues overnight from trach site 3/24 - PEG (Dr.  Sebastian) 3/24 - Sputum culture with MRSA 3/27 - Vomited. TF on hold. Abd film c/w ileus. Added reglan , got SSE. Did tolerate PSV all day  3/28 - BMs x2 after SSE day prior. Added back TFs at 1/2 rated. Tolerated PS 3/29 - Tolerated PS  3/31 - Tolerating CPAP PS 15/5, TF on hold due to ileus. 4/01 - Con't to hold tube feeds today, restarting vancomycin  and sending tracheal aspirate as peaks are 37, plat 24, driving 19. Thick secretions. Fever overnight.  4/02 - Peaks improved. Ongoing hiccups. Trickle feeding. Neuro exam unchanged.  4/20 - Trach was changed to cuffless #8 4/28 - Not safe to swallow. Limited communication and he follows simple commands. On TF 5/01 - On TC 28%, with large secretions. Working with speech and he is improving to initiate PMV trials under ST supervision   5/05 - On TC 30%, 7 L/min. Trach #6 cuffless, changed 5/1 to facilitate PMV trials  5/12 - On TC 21%, 6 L/min. Trach #6 cuffless, unable to produce sounds or speak on PMV. No significant resp secretions. On PEG TF 6/10 - Tracheostomy was removed due to failure to well provide adequate airway 02/15/2024 pulmonary critical care consult. 7/07 Patient obtunded and desaturating on NRB; transfer to icu for intubation; updated mother over phone who wants to reverse code status to full code; ent consulted 7/09 Remains intubated on no continuous sedation, right eye open but does not track movement  7/16 Revision ENT trach with [facility] Flap 7/18 Weaned on vent 5 hours  7/19 Tolerated trach collar for the majority of the day but had some  increased work of breath resulting in being placed back on vent support  7/20 No issues overnight, back on ATC this AM 7/21 Did not tolerate ATC. Placed back on PRVC. 7/24 ATC until afternoon and went on vent for WOB reportedly  7/25 ATC for a few hours  7/26 ATC x 6h 7/28 tolerating trach collar-tolerated for most of the day 7/30 on vent overnight and back on trach collar today 8/1 back on  vent overnight  8/2 was able to maintain off of ventilator overnight but by 6 AM today patient was seen with signs of increased work of breathing including tachycardia therefore patient was placed back on ventilator 8/3 remained on ventilator overnight to transition to trach collar this a.m. 8/4 did 18 hour ATC yesterday; back on vent 3am today 8/5 TC for 12-14 hours. Then went back to vent because of tachypnea.  8/6 tolerated ATC at 30% for 24 hours. Back on vent this AM for rest as was a bit tachypneic 8/11 been off vent >24 hours. 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  8/12-off vent for over 48 hours 8/18 completed abx  8/19 Changed to cuffless trach 8/20 Increasing tachypnea, hypercarbia.  Trach changed back to #6 cuffed and brought back to ICU to be placed on ventilator.  Antibiotics broadened to linezolid  and cefepime  8/23 PSV wean 8/24 eeg for twitching no sz 8/26 started TC 8/27 did TC overnight, hypercarbic on repeat gas but compensated 9/2 periods of apnea but overall doing ok ATC day vent night 9/8 continuing noct vent Atc day  9/22 Tolerating ATC during day and nocturnal ventilation  Interim History / Subjective:  CTAB, diminished in bases. SpO2 90% on TC 30% 8L. Last cxr 10/5. Rpt CXR ordered. Will see weekly.  Objective:   Blood pressure 128/88, pulse 80, temperature (!) 97 F (36.1 C), temperature source Axillary, resp. rate 20, height 5' 8 (1.727 m), weight 93 kg, SpO2 91%.    Vent Mode: PRVC FiO2 (%):  [28 %-40 %] 30 % Set Rate:  [12 bmp] 12 bmp Vt Set:  [550 mL] 550 mL PEEP:  [5 cmH20] 5 cmH20 Plateau Pressure:  [15 cmH20-19 cmH20] 15 cmH20   Intake/Output Summary (Last 24 hours) at 06/02/2024 0947 Last data filed at 06/02/2024 0600 Gross per 24 hour  Intake 1017 ml  Output 1374 ml  Net -357 ml   Filed Weights   05/30/24 0406 05/31/24 0500 06/02/24 0253  Weight: 93 kg 93.2 kg 93 kg   Physical  Exam: General: Chronically ill-appearing, lying in bed HEENT: Tape over left eye, R pupil 2mm reactive Neck: Cuffed trach in place Respiratory: CTAB, diminished in bases Cardiovascular: S1-S2 appreciated GI: Soft, rounded, nondistended Extremities:No edema, no clubbing Neuro: GCS 8-9T; [id number] R eye open, intermittent tracking, does not follow commands, Triple flex RLE, withdraw LLE, chronic right facial twitching present GU: Foley in place  Lab data reviewed  Resolved Problem List:  Post sedation hypotension Left eye conjunctivitis - resolved  Hyperglycemia, resolved Pseudomonas and E. 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 Not  a candidate for decannulation given ineffective airway clearance -course since then has been c/b recurrent HCAP, variable vent need-- at times having decompensations req 24hr vent.  -most recently he has had noct hypercarbia and its felt that he needs noct vent. He continues to demonstrate hypercarbia after prolonged ATC trials  Plan: Not a candidate for decannulation - Continue trach collar during the day and vent at night, will give him a rest with a ventilator during the day today Continue mandatory ventilator support at night -CXR weekly; more frequent as clinically indicated - Routine trach care Nebulization treatments as needed VAP bundle in place  Will cont to follow on a weekly basis   Best Practice (right click and Reselect all SmartList  Selections daily)   Dannica Bickham, DNP, AGACNP-BC [facility]  Please see [email or web address] for pager details.  From 7A-7P if no response, please call [phone]. After hours, please call ELink [phone].

## 2024-06-02 NOTE — Progress Notes (Signed)
 PROGRESS NOTE Garrett [name]  FMW:[id number] DOB: [birth year] DOA: 10/27/2023 PCP: Pcp, No  Brief Narrative/Hospital Course:    Garrett [name] is a 48 y.o. male with PMH significant for uncontrolled hypertension not on meds, smoking, marijuana use. October 27, 2023, patient was brought to the ED by EMS after he was found down with agonal breathing by his fiance, unknown downtime. EMS noted him with dried emesis over his face.   Bagging and brought him to the hospital. Initial blood pressure 262/156, emergently intubated UDS was positive for opioids, he was given several doses of Narcan  without improvement   CT head revealed an acute 4.1 cm ICH centered in the pons with intraventricular extension into the fourth ventricle and also extension into the basal cisterns. Basal cisterns and fourth ventricle are effaced without hydrocephalus. Trace additional subarachnoid hemorrhage was noted along the left parietal convexity    Was initially admitted to neuro ICU. Started on Cleviprex  drip.  Also given hypertonic saline.  Repeat CT head did not show any change. Neurology discussed with family and gave an expectation of poor prognosis. Palliative care was consulted. In the next few days, patient opened eyes and was able to follow commands but only with his eyes.  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 3/22, tracheostomy was placed.  3/24, PEG tube was placed. Patient could not be discharged because of Medicaid pending status.   Hospital course was prolonged because of failed weaning trial.  Course was complicated by recurrent ileus, episodes of infection/colonization of respiratory tract with MRSA, repeated transferred to ICU, trach and PEG dislodgment.   Patient had prolonged  difficulty weaning from vent.  Patient was on trach collar for few days but later required ventilatory support mostly at nights and is currently in ICU.  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 He was intubated in the ER Week of March 9 - March 15. Pt started on Cleviprex .  Repeat CTA showed no change in ICH of pons and similar biparietal SAH.  Neurology told family that his hemorrhage is nonsurvivable. On 10-29-2023, Pt started on hypertonic 3% saline due to cytotoxic edema and mass effect on brainstem. Palliative care consulted. Family did not want trach or PEG. Code status changed to DNR on 11-01-2023. Hypertonic saline stopped on 11-02-2023. On 11-02-2023, first noted that pt appears locked in. Pt can follow commands with his eyes. Week of March 16 - March 22. 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 Pt has some purposeful movement of left upper extremity. 11-08-2023 pt's mother and pt's fiance now want pt to have tracheostomy despite their initial statements that pt would never want a trach. 11-10-2023 pt has tracheostomy placed.  Week of March 23 - March 29. 11-12-2023 pt has PEG placed. Trach sputum cx grows MRSA.  Started on IV Vanco.  11-14-2023 pt able to follow commands using his left arm, left leg and can move his eyes up and down. Can withdraw with right leg. No movement in right arm. LTAC referral made. Abd XR consistent with ileus. Had BM after enemas. More obtunded and following less commands Week of March 30 - April 5. Tolerating CPAP trial with pressure support 15 cm Inspiratory, 5 cm expiratory. Has another ileus. Neurology signs off  case on 11-19-2023. Pt's mother with unrealistic expectations that pt will walk again. Week of April 6 - April 12. Failed trach collar trials due to thick secretions. Back on mechanical ventilation. Ophthalmology consulted for exposure keratopathy  right eye. Trach sputum cx growing MRSA again. Treated with zyvox . Trach collar trials again. Trial of Provigil  to improve alertness. Week of April 13 - April 19. Transferred to hospitalist service.  Still has #8 cuffed Shiley XLT  Week of April 20 - April 26. Trached changed to cuffless trach #8 Shiley XLT. Medicaid approved. Still awaiting for disability insurance. Week of April 27 - May 3. IV fentanyl  stopped on 12-19-2023. Mental status improved off opiates. Following simple commands to grimace, move left toes, track vertically with right eye. Tracheostomy tube downsized to cuffless #6 Shiley XLT proximal Week of May 4 - May 10. Provigil  on hold. PEG became dislodged. IR consulted to replace PEG. Week of May 11 - May 17. Unable to speak or produce any sounds with Passey-Muir valve. Not a candidate for decannulation given difficulty with phonation and inability to protect airway. Using communication yes no board. Still no disability insurance. Pt develops fevers. Urine cx grows E. Coli. Pt treated with IV Rocephin  for 5 days. Week of May 18 - 24. No major changes this week. Week of May 25 - May 31. Mother updated. No major changes Week of June 1 - June 7. Pt develops tachycardia and low grade fevers. Empirically started on cefepime /vanco. Trach sputum cx grows MRSA.  Week of June 8 - June 14.  Trach changed to #4 cuffless Shiley. On 01-30-2024. RT has difficulty passing suction catheter through #4 Shiley. Emergent bedside bronch showed distal end of tracheostomy tube was hitting tracheal wall with ulceration, edema and mucosal thickening. PCCM unable to re-insert trach. Pt decannulated.  Foley changed out. Week of June 15 - June 21. Family meeting with fiance and pt's mother. Family decided pt is NOT same to return to their home. Week of June 22 - June 28. Disability insurance still pending. PCCM called on 02-15-2024 due to desaturations. NT suctioned. Week of June 29 - July 5. Started on  vanco/cefepime  due to hypoxia and increased secretions. PCCM re-engaged. Trach cx grew MRSA and klebsiella. Week of July 6 - July 12. PCCM decides that pt needs to have trach replaced for secretion management. Palliative care discusses with pt's parents. Parents want to exclude pt's significant other [name] from decision making. 02-25-2024. PCCM called emergently to bedside due to respiratory distress. Pt moved to ICU and intubated. ENT consulted for redo tracheostomy. Foley exchanged. On 02-28-2024, family reverses code status. Pt changed to FULL CODE. Week of July 13 - July 19. ENT performs redo tracheostomy. #6 cuffed Shiley. PCCM recommends that pt NEVER be decannulated.  Week of July 20 - July 26. On trach collar trials. No major changes. Pt remains on ICU. Week of July 27 - August 2. PCCM feels that pt's respiratory tract is colonized and to only repeat respiratory cultures if secretions increase, develops leukocytosis, fever, etc(signs of systemic illness). Tolerating trach collar for most of the day and night. But by 6 am the next day, his work of breathing becomes labored and needs to go back on mechanical ventilation. Week of August 3 - August 9. Trach collar trials to see if he last 48 hours on just trach collar. Pt fails to last 48 hours on trach collar only. Plans made to use trach collar during day and mechanical ventilation at  night. Pt's care transferred to hospitalist service on 03-27-2024. Pt to remains in ICU due to need for nighttime mechanical ventilation. PICC line removed.  Week of August 10 - August 16. Pt lasts 24 hours on trach collar only without nighttime mechanical ventilation. Ophthalmology performs left temporary tarsorrhaphy. Tracheal aspirate and urine cx shows Pseudomonas. Started on IV cefepime  for 14 days. Week of August 17 - August 23. Evening of 04-09-2024, pt desats to 30%. PCCM re-engaged. Changed to cuffless trach #6 Shliey. Pt transferred back to ICU and placed back on  mechanical ventilation.  Week of August 24 - August 30. On 04-14-2024, ophthalmology removes left eye tarsorrhaphy. PCCM decides on trach collar during day and mechanical ventilation at night. Pt transfers back to hospitalist service. Week of August 31 - September 6. Continue trach collar during day and mechanical ventilation at night. Pt develops hypercapnia when left on extended trach collar. Pt should never be decannulated as he has already failed trach decannulation. Still waiting for disability insurance. Week of September 7 - September 13. No major issues this week.4 Week of September 14 - September 20. Still awaiting disability insurance. Continues on trach collar during daytime and nighttime mechanical ventilation. Pt has low grade fevers. [name] changed out 05-08-2024 Week of September 21 - September 27. Fevers resolved. No abx started. Week of September 28 - October 4. No major changes. Week of October 5 - October 7. PCCM reengaged due to tachypnea and increased work of breathing.   Procedures: 10-27-2023 endotracheal intubation 11-10-2023 bedside tracheostomy by PCCM 11-12-2023 bedside PEG by general surgery 01-30-2024 pt tracheostomy decannulated due to inability to replace trach. 02-25-2024 intubated for respiratory distress. 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  Blood pressure stabilized continue metoprolol  tartrate 75 mg bid, amlodipine  10 and PRNs   Acute on chronic resp failure w hypoxia and hypercapnea  Trach dependence-nocturnal ventilator dependence MRSA, Pseudomonas with trach Decannulation preparatory due to ineffective airway clearance Chronic hypoventilation: S/p tracheostomy redo 7/16.  Not a candidate for decannulation given ineffective airway clearance. Continue trach collar during day with vent support at night.He is on 8 lit of oxygen via trach collar.  Continue trach care per protocol. Trach replaced last on 05/23/24. 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 Waylan evaluated and placed tarsorraphy.   8/29-ophtho rec: Erythromycin  eyedrop q2h, tape eyes with paper tape if not blinking. 9/14, ophtho, Dr. Leane reengaged due to worsening conjunctivitis and clouding recommended second antibiotic, gatifloxacin  4 times daily.per Optho, no epithelial defects noted. 9/25: courses completed of abx drops; continue on opth ointment and cover left eye with patch and/or tape to keep lid closed  9/29, more injected with discharge again in OS, given 5 day course of Polytrim  eyedrops to left eye.  Overall remains stable-left eye Covered.   Hypernatremia Resolved.  Cont TF w/ free water  RD following, On 150 cc q 6 hr. Intermittently monitor labs   Elevated liver enzymes:  Mild.  Unclear etiology.  CK normal, checking intermittently   Inadequate oral intake Estimated Body mass index is 31.17 kg/m.: PEG replaced by IR on 05/15/24 after becoming dislodged again RD is following, continue tube feeding and supplements  Goals of care: Remains full code but prognosis is not bright and appears very poor, continue to provide supportive  care.  Remains high risk for decompensation and continue to manage in ICU Palliative care involved on few occasions-however at this time patient remains full code with full scope of treatment and palliative care signed off  Mobility: PT Orders:  PT Follow up Rec: Recommend Snf, Barriers To Snf Placement - Toc To F/U With Patient/Family For D/C Plans8/13/2025 1300   DVT prophylaxis: enoxaparin  (LOVENOX ) injection 40 mg Start: 03/06/24 1000 SCDs Start: 10/27/23 2226 Code Status:   Code Status: Full Code Family Communication: plan of care discussed with patient at bedside. Patient status is: Remains hospitalized because of severity of illness Level of care: ICU   Dispo: The patient is from: Home            Anticipated disposition: TBD Objective: Vitals last 24 hrs: Vitals:   06/02/24 0716 06/02/24 0800 06/02/24 0820 06/02/24 0900  BP:  101/76  128/88  Pulse:  67 74 80  Resp:  12 20 20   Temp: (!) 97 F (36.1 C)     TempSrc: Axillary     SpO2:  99% 97% 91%  Weight:      Height:        Physical Examination: General exam: Chronically ill looking,  HEENT:Oral mucosa moist, Ear/Nose WNL grossly  Respiratory system: Bilaterally diminished at the base, cuffed trach in place  Cardiovascular system: S1 & S2 +, No JVD. Gastrointestinal system: Abdomen soft,NT,ND, BS+ peg+ Nervous System: Intermittent tracking, eyes open intermittent tracking but does not follow commands, with right facial twitches Extremities: extremities warm, leg edema neg. Skin: No rashes,no icterus. MSK: weak  Medications reviewed:  Scheduled Meds:  amLODipine   10 mg Per Tube Daily   artificial tears   Both Eyes BID   Chlorhexidine  Gluconate Cloth  6 each Topical Daily   enoxaparin  (LOVENOX ) injection  40 mg Subcutaneous Daily   famotidine   20 mg Per Tube BID   feeding supplement (JEVITY 1.5 CAL/FIBER)  237 mL Per Tube 5 X Daily   feeding supplement (PROSource TF20)  60 mL Per Tube TID   free water   150 mL  Per Tube Q6H   insulin  aspart  0-9 Units Subcutaneous Q6H   metoprolol  tartrate  75 mg Per Tube BID   multivitamin with minerals  1 tablet Per Tube Daily   mouth rinse  15 mL Mouth Rinse Q2H   polyethylene glycol  17 g Per Tube Daily   Continuous Infusions: Diet: Diet Order             Diet NPO time specified  Diet effective now                      Data Reviewed: I have personally reviewed following labs and imaging studies ( see epic result tab) CBC: Recent Labs  Lab 05/29/24 1255  WBC 9.5  NEUTROABS 7.0  HGB 10.8*  HCT 36.0*  MCV 90.9  PLT 359   CMP: Recent Labs  Lab 05/29/24 1255  NA 137  K 4.1  CL  97*  CO2 30  GLUCOSE 118*  BUN 24*  CREATININE 0.42*  CALCIUM 8.9   GFR: Estimated Creatinine Clearance: 126.3 mL/min (A) (by C-G formula based on SCr of 0.42 mg/dL (L)). Recent Labs  Lab 05/29/24 1255  AST 34  ALT 79*  ALKPHOS 61  BILITOT 0.2  PROT 7.2  ALBUMIN  2.7*   No results for input(s): LIPASE, AMYLASE in the last 168 hours. No results for input(s): AMMONIA in the last 168 hours. Coagulation Profile: No results for input(s): INR, PROTIME in the last 168 hours. Unresulted Labs (From admission, onward)     Start     Ordered   04/10/24 0429  CBC with Differential/Platelet  ONCE - STAT,   STAT       Question:  Specimen collection method  Answer:  Lab=Lab collect   04/10/24 0429           Antimicrobials/Microbiology: Anti-infectives (From admission, onward)    Start     Dose/Rate Route Frequency Ordered Stop   04/09/24 2200  linezolid  (ZYVOX ) tablet 600 mg  Status:  Discontinued        600 mg Per Tube Every 12 hours 04/09/24 1132 04/13/24 1332   04/09/24 1700  vancomycin  (VANCOREADY) IVPB 1500 mg/300 mL  Status:  Discontinued        1,500 mg 150 mL/hr over 120 Minutes Intravenous Every 12 hours 04/09/24 1108 04/09/24 1132   04/09/24 1000  linezolid  (ZYVOX ) IVPB 600 mg  Status:  Discontinued        600 mg 300 mL/hr over 60  Minutes Intravenous Every 12 hours 04/09/24 0527 04/09/24 1108   04/01/24 1145  ceFEPIme  (MAXIPIME ) 2 g in sodium chloride  0.9 % 100 mL IVPB        2 g 200 mL/hr over 30 Minutes Intravenous Every 8 hours 04/01/24 1137 04/14/24 2230   02/21/24 1000  vancomycin  (VANCOREADY) IVPB 1250 mg/250 mL        1,250 mg 166.7 mL/hr over 90 Minutes Intravenous Every 12 hours 02/20/24 2158 02/28/24 0130   02/16/24 1445  ceFEPIme  (MAXIPIME ) 2 g in sodium chloride  0.9 % 100 mL IVPB        2 g 200 mL/hr over 30 Minutes Intravenous Every 8 hours 02/16/24 1348 02/27/24 2322   02/16/24 0945  vancomycin  (VANCOREADY) IVPB 1500 mg/300 mL  Status:  Discontinued        1,500 mg 150 mL/hr over 120 Minutes Intravenous Every 12 hours 02/16/24 0848 02/20/24 2158   02/15/24 1215  Ampicillin -Sulbactam (UNASYN ) 3 g in sodium chloride  0.9 % 100 mL IVPB  Status:  Discontinued        3 g 200 mL/hr over 30 Minutes Intravenous Every 6 hours 02/15/24 1125 02/16/24 1226   01/24/24 2000  vancomycin  (VANCOREADY) IVPB 1250 mg/250 mL        1,250 mg 166.7 mL/hr over 90 Minutes Intravenous Every 24 hours 01/24/24 1154 01/27/24 0125   01/22/24 2200  Vancomycin  (VANCOCIN ) 1,250 mg in sodium chloride  0.9 % 250 mL IVPB  Status:  Discontinued        1,250 mg 166.7 mL/hr over 90 Minutes Intravenous Every 24 hours 01/22/24 1013 01/22/24 1020   01/22/24 2000  vancomycin  (VANCOREADY) IVPB 1250 mg/250 mL  Status:  Discontinued        1,250 mg 166.7 mL/hr over 90 Minutes Intravenous Every 24 hours 01/22/24 1020 01/24/24 1154   01/20/24 2000  ceFEPIme  (MAXIPIME ) 2 g in sodium chloride  0.9 % 100 mL IVPB  Status:  Discontinued        2 g 200 mL/hr over 30 Minutes Intravenous Every 8 hours 01/20/24 1900 01/24/24 1154   01/20/24 2000  Vancomycin  (VANCOCIN ) 1,250 mg in sodium chloride  0.9 % 250 mL IVPB  Status:  Discontinued        1,250 mg 166.7 mL/hr over 90 Minutes Intravenous Every 24 hours 01/20/24 1900 01/22/24 1013   01/03/24 1730   cefTRIAXone  (ROCEPHIN ) 1 g in sodium chloride  0.9 % 100 mL IVPB        1 g 200 mL/hr over 30 Minutes Intravenous Every 24 hours 01/03/24 1235 01/07/24 0710   01/02/24 1830  ceFEPIme  (MAXIPIME ) 2 g in sodium chloride  0.9 % 100 mL IVPB  Status:  Discontinued        2 g 200 mL/hr over 30 Minutes Intravenous Every 8 hours 01/02/24 1732 01/03/24 1235   01/02/24 1830  Vancomycin  (VANCOCIN ) 1,250 mg in sodium chloride  0.9 % 250 mL IVPB  Status:  Discontinued        1,250 mg 166.7 mL/hr over 90 Minutes Intravenous Every 24 hours 01/02/24 1732 01/04/24 1754   01/01/24 1200  cefTRIAXone  (ROCEPHIN ) 1 g in sodium chloride  0.9 % 100 mL IVPB  Status:  Discontinued        1 g 200 mL/hr over 30 Minutes Intravenous Every 24 hours 01/01/24 1057 01/02/24 1713   11/29/23 2200  linezolid  (ZYVOX ) tablet 600 mg        600 mg Per Tube Every 12 hours 11/29/23 1034 12/04/23 2215   11/28/23 1000  linezolid  (ZYVOX ) IVPB 600 mg  Status:  Discontinued        600 mg 300 mL/hr over 60 Minutes Intravenous Every 12 hours 11/28/23 0831 11/29/23 1034   11/23/23 1200  vancomycin  (VANCOREADY) IVPB 1250 mg/250 mL  Status:  Discontinued        1,250 mg 166.7 mL/hr over 90 Minutes Intravenous Every 24 hours 11/23/23 1036 11/28/23 0831   11/20/23 1100  vancomycin  (VANCOREADY) IVPB 1250 mg/250 mL  Status:  Discontinued        1,250 mg 166.7 mL/hr over 90 Minutes Intravenous Every 24 hours 11/20/23 0923 11/20/23 0923   11/20/23 1100  vancomycin  (VANCOREADY) IVPB 1250 mg/250 mL  Status:  Discontinued        1,250 mg 166.7 mL/hr over 90 Minutes Intravenous Every 24 hours 11/20/23 0923 11/23/23 1036   11/15/23 2300  vancomycin  (VANCOREADY) IVPB 1250 mg/250 mL        1,250 mg 166.7 mL/hr over 90 Minutes Intravenous Every 24 hours 11/15/23 2215 11/19/23 0924   11/12/23 2200  vancomycin  (VANCOREADY) IVPB 750 mg/150 mL  Status:  Discontinued        750 mg 150 mL/hr over 60 Minutes Intravenous Every 12 hours 11/12/23 0855 11/15/23 2215    11/12/23 0930  vancomycin  (VANCOREADY) IVPB 2000 mg/400 mL        2,000 mg 200 mL/hr over 120 Minutes Intravenous  Once 11/12/23 0838 11/12/23 1116   11/11/23 1345  Ampicillin -Sulbactam (UNASYN ) 3 g in sodium chloride  0.9 % 100 mL IVPB  Status:  Discontinued        3 g 200 mL/hr over 30 Minutes Intravenous Every 6 hours 11/11/23 1257 11/12/23 0836   10/28/23 0400  Ampicillin -Sulbactam (UNASYN ) 3 g in sodium chloride  0.9 % 100 mL IVPB  Status:  Discontinued        3 g 200 mL/hr over 30 Minutes Intravenous Every 6 hours 10/27/23 2151 11/02/23  0945   10/27/23 1845  vancomycin  (VANCOCIN ) IVPB 1000 mg/200 mL premix  Status:  Discontinued        1,000 mg 200 mL/hr over 60 Minutes Intravenous  Once 10/27/23 1843 10/27/23 1843   10/27/23 1845  ceFEPIme  (MAXIPIME ) 2 g in sodium chloride  0.9 % 100 mL IVPB        2 g 200 mL/hr over 30 Minutes Intravenous  Once 10/27/23 1843 10/27/23 2024   10/27/23 1845  vancomycin  (VANCOREADY) IVPB 2000 mg/400 mL        2,000 mg 200 mL/hr over 120 Minutes Intravenous  Once 10/27/23 1843 10/27/23 2123         Component Value Date/Time   SDES BLOOD LEFT ARM 05/10/2024 1652   SPECREQUEST  05/10/2024 1652    BOTTLES DRAWN AEROBIC AND ANAEROBIC Blood Culture results may not be optimal due to an inadequate volume of blood received in culture bottles   CULT  05/10/2024 1652    NO GROWTH 5 DAYS Performed at [facility], [address] [place], [name] [address]    REPTSTATUS 05/15/2024 FINAL 05/10/2024 1652  Procedures: Procedure(s) (LRB): CREATION, TRACHEOSTOMY (N/A)  Mennie LAMY, MD [facility] 06/02/2024, 10:45 AM

## 2024-06-03 DIAGNOSIS — I613 Nontraumatic intracerebral hemorrhage in brain stem: Secondary | ICD-10-CM | POA: Diagnosis not present

## 2024-06-03 LAB — GLUCOSE, CAPILLARY
Glucose-Capillary: 99 mg/dL (ref 70–99)
Glucose-Capillary: 146 mg/dL — ABNORMAL HIGH (ref 70–99)
Glucose-Capillary: 159 mg/dL — ABNORMAL HIGH (ref 70–99)
Glucose-Capillary: 141 mg/dL — ABNORMAL HIGH (ref 70–99)

## 2024-06-03 NOTE — Progress Notes (Signed)
 PROGRESS NOTE Garrett [name]  FMW:[id number] DOB: Jan 25, 1976 DOA: 10/27/2023 PCP: Pcp, No  Brief Narrative/Hospital Course: Garrett [name] is a 48 y.o. male with PMH significant for uncontrolled hypertension not on meds, smoking, marijuana use. October 27, 2023, patient was brought to the ED by EMS after he was found down with agonal breathing by his fiance, unknown downtime. EMS noted him with dried emesis over his face.   Bagging and brought him to the hospital. Initial blood pressure 262/156, emergently intubated UDS was positive for opioids, he was given several doses of Narcan  without improvement   CT head revealed an acute 4.1 cm ICH centered in the pons with intraventricular extension into the fourth ventricle and also extension into the basal cisterns. Basal cisterns and fourth ventricle are effaced without hydrocephalus. Trace additional subarachnoid hemorrhage was noted along the left parietal convexity    Was initially admitted to neuro ICU. Started on Cleviprex  drip.  Also given hypertonic saline.  Repeat CT head did not show any change. Neurology discussed with family and gave an expectation of poor prognosis. Palliative care was consulted. In the next few days, patient opened eyes and was able to follow commands but only with his eyes.  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 3/22, tracheostomy was placed.  3/24, PEG tube was placed. Patient could not be discharged because of Medicaid pending status.   Hospital course was prolonged because of failed weaning trial.  Course was complicated by recurrent ileus, episodes of infection/colonization of respiratory tract with MRSA, repeated transferred to ICU, trach and PEG dislodgment.   Patient had prolonged difficulty  weaning from vent.  Patient was on trach collar for few days but later required ventilatory support mostly at nights and is currently in ICU.  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 He was intubated in the ER Week of March 9 - March 15. Pt started on Cleviprex .  Repeat CTA showed no change in ICH of pons and similar biparietal SAH.  Neurology told family that his hemorrhage is nonsurvivable. On 10-29-2023, Pt started on hypertonic 3% saline due to cytotoxic edema and mass effect on brainstem. Palliative care consulted. Family did not want trach or PEG. Code status changed to DNR on 11-01-2023. Hypertonic saline stopped on 11-02-2023. On 11-02-2023, first noted that pt appears locked in. Pt can follow commands with his eyes. Week of March 16 - March 22. 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 Pt has some purposeful movement of left upper extremity. 11-08-2023 pt's mother and pt's fiance now want pt to have tracheostomy despite their initial statements that pt would never want a trach. 11-10-2023 pt has tracheostomy placed.  Week of March 23 - March 29. 11-12-2023 pt has PEG placed. Trach sputum cx grows MRSA.  Started on IV Vanco.  11-14-2023 pt able to follow commands using his left arm, left leg and can move his eyes up and down. Can withdraw with right leg. No movement in right arm. LTAC referral made. Abd XR consistent with ileus. Had BM after enemas. More obtunded and following less commands Week of March 30 - April 5. Tolerating CPAP trial with pressure support 15 cm Inspiratory, 5 cm expiratory. Has another ileus. Neurology signs off case on 11-19-2023.  Pt's mother with unrealistic expectations that pt will walk again. Week of April 6 - April 12. Failed trach collar trials due to thick secretions. Back on mechanical ventilation. 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  Trach sputum cx growing MRSA again. Treated with zyvox . Trach collar trials again. Trial of Provigil  to improve alertness. Week of April 13 - April 19. Transferred to hospitalist service.  Still has #8 cuffed Shiley XLT  Week of April 20 - April 26. Trached changed to cuffless trach #8 Shiley XLT. Medicaid approved. Still awaiting for disability insurance. Week of April 27 - May 3. IV fentanyl  stopped on 12-19-2023. Mental status improved off opiates. Following simple commands to grimace, move left toes, track vertically with right eye. Tracheostomy tube downsized to cuffless #6 Shiley XLT proximal Week of May 4 - May 10. Provigil  on hold. PEG became dislodged. IR consulted to replace PEG. Week of May 11 - May 17. Unable to speak or produce any sounds with Passey-Muir valve. Not a candidate for decannulation given difficulty with phonation and inability to protect airway. Using communication yes no board. Still no disability insurance. Pt develops fevers. Urine cx grows E. Coli. Pt treated with IV Rocephin  for 5 days. Week of May 18 - 24. No major changes this week. Week of May 25 - May 31. Mother updated. No major changes Week of June 1 - June 7. Pt develops tachycardia and low grade fevers. Empirically started on cefepime /vanco. Trach sputum cx grows MRSA.  Week of June 8 - June 14.  Trach changed to #4 cuffless Shiley. On 01-30-2024. RT has difficulty passing suction catheter through #4 Shiley. Emergent bedside bronch showed distal end of tracheostomy tube was hitting tracheal wall with ulceration, edema and mucosal thickening. PCCM unable to re-insert trach. Pt decannulated.  Foley changed out. Week of June 15 - June 21. Family meeting with fiance and pt's mother. Family decided pt is NOT same to return to their home. Week of June 22 - June 28. Disability insurance still pending. PCCM called on 02-15-2024 due to desaturations. NT suctioned. Week of June 29 - July 5. Started on vanco/cefepime  due to  hypoxia and increased secretions. PCCM re-engaged. Trach cx grew MRSA and klebsiella. Week of July 6 - July 12. PCCM decides that pt needs to have trach replaced for secretion management. Palliative care discusses with pt's parents. Parents want to exclude pt's significant other [name] from decision making. 02-25-2024. PCCM called emergently to bedside due to respiratory distress. Pt moved to ICU and intubated. ENT consulted for redo tracheostomy. Foley exchanged. On 02-28-2024, family reverses code status. Pt changed to FULL CODE. Week of July 13 - July 19. ENT performs redo tracheostomy. #6 cuffed Shiley. PCCM recommends that pt NEVER be decannulated.  Week of July 20 - July 26. On trach collar trials. No major changes. Pt remains on ICU. Week of July 27 - August 2. PCCM feels that pt's respiratory tract is colonized and to only repeat respiratory cultures if secretions increase, develops leukocytosis, fever, etc(signs of systemic illness). Tolerating trach collar for most of the day and night. But by 6 am the next day, his work of breathing becomes labored and needs to go back on mechanical ventilation. Week of August 3 - August 9. Trach collar trials to see if he last 48 hours on just trach collar. Pt fails to last 48 hours on trach collar only. Plans made to use trach collar during day and mechanical ventilation at night. Pt's care  transferred to hospitalist service on 03-27-2024. Pt to remains in ICU due to need for nighttime mechanical ventilation. PICC line removed.  Week of August 10 - August 16. Pt lasts 24 hours on trach collar only without nighttime mechanical ventilation. Ophthalmology performs left temporary tarsorrhaphy. Tracheal aspirate and urine cx shows Pseudomonas. Started on IV cefepime  for 14 days. Week of August 17 - August 23. Evening of 04-09-2024, pt desats to 30%. PCCM re-engaged. Changed to cuffless trach #6 Shliey. Pt transferred back to ICU and placed back on mechanical ventilation.   Week of August 24 - August 30. On 04-14-2024, ophthalmology removes left eye tarsorrhaphy. PCCM decides on trach collar during day and mechanical ventilation at night. Pt transfers back to hospitalist service. Week of August 31 - September 6. Continue trach collar during day and mechanical ventilation at night. Pt develops hypercapnia when left on extended trach collar. Pt should never be decannulated as he has already failed trach decannulation. Still waiting for disability insurance. Week of September 7 - September 13. No major issues this week.4 Week of September 14 - September 20. Still awaiting disability insurance. Continues on trach collar during daytime and nighttime mechanical ventilation. Pt has low grade fevers. [name] changed out 05-08-2024 Week of September 21 - September 27. Fevers resolved. No abx started. Week of September 28 - October 4. No major changes. Week of October 5 - October 7. PCCM reengaged due to tachypnea and increased work of breathing.   Procedures: 10-27-2023 endotracheal intubation 11-10-2023 bedside tracheostomy by PCCM 11-12-2023 bedside PEG by general surgery 01-30-2024 pt tracheostomy decannulated due to inability to replace trach. 02-25-2024 intubated for respiratory distress. 03-05-2024 redo tracheostomy by ENT 03-31-2024 Ophthalmology performs left temporary tarsorrhaphy  Consultants: Neurology PCCM General surgery Ophthalmology   Subjective: Seen and examined today Eyes open able to track some, erythema on the left eyes similar Does not follow command Overnight on vent,  blood pressure stable, placed on ATC 10 L. 35%  but did not tolerate due to increasing work of breathing placed back on full vent support.  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  Blood pressure stabilized continue metoprolol  tartrate 75 mg bid, amlodipine  10 and PRNs   Acute on chronic resp failure w hypoxia and hypercapnea  Trach dependence-nocturnal ventilator dependence MRSA, Pseudomonas with trach Decannulation preparatory due to ineffective airway clearance Chronic hypoventilation: S/p tracheostomy redo 7/16.  Not a candidate for decannulation given ineffective airway clearance. Continue trach collar during day with vent support at night.He is on 8 lit of oxygen via trach collar.  Continue trach care per protocol. Trach replaced last on 05/23/24. Pulmonary following weekly -last chest x-ray 10/13-poor depth of inspiration with decreased right basilar atelectasis 10/14 am Placed on ATC 10 L. 35%  but did not tolerate due to increasing work of breathing placed back on full vent support. 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 Waylan evaluated and placed tarsorraphy.   8/29-ophtho rec: Erythromycin  eyedrop q2h, tape eyes with paper tape if not blinking. 9/14, ophtho,  Dr. Leane reengaged due to worsening conjunctivitis and clouding recommended second antibiotic, gatifloxacin  4 times daily.per Optho, no epithelial defects noted. 9/25: courses completed of abx drops; continue on opth ointment and cover left eye with patch and/or tape to keep lid closed  9/29, more injected with discharge again in OS, given 5 day course of Polytrim  eyedrops to left eye.  Overall remains stable-left eye Covered.   Hypernatremia Resolved.Cont TF w/ free water  RD following, On 150 cc q 6 hr. Intermittently monitor labs   Elevated liver enzymes:  Mild.Unclear  etiology.  CK normal, checking intermittently once a week.   Inadequate oral intake Estimated Body mass index is 31.17 kg/m.: PEG replaced by IR on 05/15/24 after becoming dislodged again RD is following, continue tube feeding and supplements and augment.  Goals of care: Remains full code but prognosis is not bright and appears very poor, continue to provide supportive care.  Remains high risk for decompensation and continue to manage in ICU Palliative care involved on few occasions-however at this time patient remains full code with full scope of treatment and palliative care signed off  Mobility: PT Orders:  PT Follow up Rec: Recommend Snf, Barriers To Snf Placement - Toc To F/U With Patient/Family For D/C Plans8/13/2025 1300   DVT prophylaxis: enoxaparin  (LOVENOX ) injection 40 mg Start: 03/06/24 1000 SCDs Start: 10/27/23 2226 Code Status:   Code Status: Full Code Family Communication: plan of care discussed with patient at bedside. Patient status is: Remains hospitalized because of severity of illness Level of care: ICU   Dispo: The patient is from: Home            Anticipated disposition: TBD.  Pending placement to facility with vent Objective: Vitals last 24 hrs: Vitals:   06/03/24 0910 06/03/24 1000 06/03/24 1100 06/03/24 1129  BP: (!) 145/107 (!) 135/95 127/86   Pulse: 93 81 78   Resp: 20 15 16    Temp:    99 F (37.2 C)  TempSrc:    Axillary  SpO2: 97% 98% 99%   Weight:      Height:        Physical Examination: General exam: Chronically ill looking, eyes open w/ erythema on left HEENT:Oral mucosa moist, Ear/Nose WNL grossly  Respiratory system: b/l diminished at the base, cuffed trach in place  Cardiovascular system: S1 & S2 +, No JVD. Gastrointestinal system: Abdomen soft,NT,ND, BS+ PEG+ Nervous System: Tracks intermittently, eyes open does not follow command  Extremities: extremities warm, leg edema neg. Skin: No rashes,no icterus. MSK: weak  Medications  reviewed:  Scheduled Meds:  amLODipine   10 mg Per Tube Daily   artificial tears   Both Eyes BID   Chlorhexidine  Gluconate Cloth  6 each Topical Daily   enoxaparin  (LOVENOX ) injection  40 mg Subcutaneous Daily   famotidine   20 mg Per Tube BID   feeding supplement (JEVITY 1.5 CAL/FIBER)  237 mL Per Tube 5 X Daily   feeding supplement (PROSource TF20)  60 mL Per Tube TID   free water   150 mL Per Tube Q6H   insulin  aspart  0-9 Units Subcutaneous Q6H   metoprolol  tartrate  75 mg Per Tube BID   multivitamin with minerals  1 tablet Per Tube Daily   mouth rinse  15 mL Mouth Rinse Q2H   polyethylene glycol  17 g Per Tube Daily   Continuous Infusions: Diet: Diet Order             Diet NPO time specified  Diet  effective now                      Data Reviewed: I have personally reviewed following labs and imaging studies ( see epic result tab) CBC: Recent Labs  Lab 05/29/24 1255  WBC 9.5  NEUTROABS 7.0  HGB 10.8*  HCT 36.0*  MCV 90.9  PLT 359   CMP: Recent Labs  Lab 05/29/24 1255  NA 137  K 4.1  CL 97*  CO2 30  GLUCOSE 118*  BUN 24*  CREATININE 0.42*  CALCIUM 8.9   GFR: Estimated Creatinine Clearance: 126.3 mL/min (A) (by C-G formula based on SCr of 0.42 mg/dL (L)). Recent Labs  Lab 05/29/24 1255  AST 34  ALT 79*  ALKPHOS 61  BILITOT 0.2  PROT 7.2  ALBUMIN  2.7*   No results for input(s): LIPASE, AMYLASE in the last 168 hours. No results for input(s): AMMONIA in the last 168 hours. Coagulation Profile: No results for input(s): INR, PROTIME in the last 168 hours. Unresulted Labs (From admission, onward)     Start     Ordered   04/10/24 0429  CBC with Differential/Platelet  ONCE - STAT,   STAT       Question:  Specimen collection method  Answer:  Lab=Lab collect   04/10/24 0429           Antimicrobials/Microbiology: Anti-infectives (From admission, onward)    Start     Dose/Rate Route Frequency Ordered Stop   04/09/24 2200  linezolid   (ZYVOX ) tablet 600 mg  Status:  Discontinued        600 mg Per Tube Every 12 hours 04/09/24 1132 04/13/24 1332   04/09/24 1700  vancomycin  (VANCOREADY) IVPB 1500 mg/300 mL  Status:  Discontinued        1,500 mg 150 mL/hr over 120 Minutes Intravenous Every 12 hours 04/09/24 1108 04/09/24 1132   04/09/24 1000  linezolid  (ZYVOX ) IVPB 600 mg  Status:  Discontinued        600 mg 300 mL/hr over 60 Minutes Intravenous Every 12 hours 04/09/24 0527 04/09/24 1108   04/01/24 1145  ceFEPIme  (MAXIPIME ) 2 g in sodium chloride  0.9 % 100 mL IVPB        2 g 200 mL/hr over 30 Minutes Intravenous Every 8 hours 04/01/24 1137 04/14/24 2230   02/21/24 1000  vancomycin  (VANCOREADY) IVPB 1250 mg/250 mL        1,250 mg 166.7 mL/hr over 90 Minutes Intravenous Every 12 hours 02/20/24 2158 02/28/24 0130   02/16/24 1445  ceFEPIme  (MAXIPIME ) 2 g in sodium chloride  0.9 % 100 mL IVPB        2 g 200 mL/hr over 30 Minutes Intravenous Every 8 hours 02/16/24 1348 02/27/24 2322   02/16/24 0945  vancomycin  (VANCOREADY) IVPB 1500 mg/300 mL  Status:  Discontinued        1,500 mg 150 mL/hr over 120 Minutes Intravenous Every 12 hours 02/16/24 0848 02/20/24 2158   02/15/24 1215  Ampicillin -Sulbactam (UNASYN ) 3 g in sodium chloride  0.9 % 100 mL IVPB  Status:  Discontinued        3 g 200 mL/hr over 30 Minutes Intravenous Every 6 hours 02/15/24 1125 02/16/24 1226   01/24/24 2000  vancomycin  (VANCOREADY) IVPB 1250 mg/250 mL        1,250 mg 166.7 mL/hr over 90 Minutes Intravenous Every 24 hours 01/24/24 1154 01/27/24 0125   01/22/24 2200  Vancomycin  (VANCOCIN ) 1,250 mg in sodium chloride  0.9 % 250 mL  IVPB  Status:  Discontinued        1,250 mg 166.7 mL/hr over 90 Minutes Intravenous Every 24 hours 01/22/24 1013 01/22/24 1020   01/22/24 2000  vancomycin  (VANCOREADY) IVPB 1250 mg/250 mL  Status:  Discontinued        1,250 mg 166.7 mL/hr over 90 Minutes Intravenous Every 24 hours 01/22/24 1020 01/24/24 1154   01/20/24 2000  ceFEPIme   (MAXIPIME ) 2 g in sodium chloride  0.9 % 100 mL IVPB  Status:  Discontinued        2 g 200 mL/hr over 30 Minutes Intravenous Every 8 hours 01/20/24 1900 01/24/24 1154   01/20/24 2000  Vancomycin  (VANCOCIN ) 1,250 mg in sodium chloride  0.9 % 250 mL IVPB  Status:  Discontinued        1,250 mg 166.7 mL/hr over 90 Minutes Intravenous Every 24 hours 01/20/24 1900 01/22/24 1013   01/03/24 1730  cefTRIAXone  (ROCEPHIN ) 1 g in sodium chloride  0.9 % 100 mL IVPB        1 g 200 mL/hr over 30 Minutes Intravenous Every 24 hours 01/03/24 1235 01/07/24 0710   01/02/24 1830  ceFEPIme  (MAXIPIME ) 2 g in sodium chloride  0.9 % 100 mL IVPB  Status:  Discontinued        2 g 200 mL/hr over 30 Minutes Intravenous Every 8 hours 01/02/24 1732 01/03/24 1235   01/02/24 1830  Vancomycin  (VANCOCIN ) 1,250 mg in sodium chloride  0.9 % 250 mL IVPB  Status:  Discontinued        1,250 mg 166.7 mL/hr over 90 Minutes Intravenous Every 24 hours 01/02/24 1732 01/04/24 1754   01/01/24 1200  cefTRIAXone  (ROCEPHIN ) 1 g in sodium chloride  0.9 % 100 mL IVPB  Status:  Discontinued        1 g 200 mL/hr over 30 Minutes Intravenous Every 24 hours 01/01/24 1057 01/02/24 1713   11/29/23 2200  linezolid  (ZYVOX ) tablet 600 mg        600 mg Per Tube Every 12 hours 11/29/23 1034 12/04/23 2215   11/28/23 1000  linezolid  (ZYVOX ) IVPB 600 mg  Status:  Discontinued        600 mg 300 mL/hr over 60 Minutes Intravenous Every 12 hours 11/28/23 0831 11/29/23 1034   11/23/23 1200  vancomycin  (VANCOREADY) IVPB 1250 mg/250 mL  Status:  Discontinued        1,250 mg 166.7 mL/hr over 90 Minutes Intravenous Every 24 hours 11/23/23 1036 11/28/23 0831   11/20/23 1100  vancomycin  (VANCOREADY) IVPB 1250 mg/250 mL  Status:  Discontinued        1,250 mg 166.7 mL/hr over 90 Minutes Intravenous Every 24 hours 11/20/23 0923 11/20/23 0923   11/20/23 1100  vancomycin  (VANCOREADY) IVPB 1250 mg/250 mL  Status:  Discontinued        1,250 mg 166.7 mL/hr over 90 Minutes  Intravenous Every 24 hours 11/20/23 0923 11/23/23 1036   11/15/23 2300  vancomycin  (VANCOREADY) IVPB 1250 mg/250 mL        1,250 mg 166.7 mL/hr over 90 Minutes Intravenous Every 24 hours 11/15/23 2215 11/19/23 0924   11/12/23 2200  vancomycin  (VANCOREADY) IVPB 750 mg/150 mL  Status:  Discontinued        750 mg 150 mL/hr over 60 Minutes Intravenous Every 12 hours 11/12/23 0855 11/15/23 2215   11/12/23 0930  vancomycin  (VANCOREADY) IVPB 2000 mg/400 mL        2,000 mg 200 mL/hr over 120 Minutes Intravenous  Once 11/12/23 9161 11/12/23 1116   11/11/23  1345  Ampicillin -Sulbactam (UNASYN ) 3 g in sodium chloride  0.9 % 100 mL IVPB  Status:  Discontinued        3 g 200 mL/hr over 30 Minutes Intravenous Every 6 hours 11/11/23 1257 11/12/23 0836   10/28/23 0400  Ampicillin -Sulbactam (UNASYN ) 3 g in sodium chloride  0.9 % 100 mL IVPB  Status:  Discontinued        3 g 200 mL/hr over 30 Minutes Intravenous Every 6 hours 10/27/23 2151 11/02/23 0945   10/27/23 1845  vancomycin  (VANCOCIN ) IVPB 1000 mg/200 mL premix  Status:  Discontinued        1,000 mg 200 mL/hr over 60 Minutes Intravenous  Once 10/27/23 1843 10/27/23 1843   10/27/23 1845  ceFEPIme  (MAXIPIME ) 2 g in sodium chloride  0.9 % 100 mL IVPB        2 g 200 mL/hr over 30 Minutes Intravenous  Once 10/27/23 1843 10/27/23 2024   10/27/23 1845  vancomycin  (VANCOREADY) IVPB 2000 mg/400 mL        2,000 mg 200 mL/hr over 120 Minutes Intravenous  Once 10/27/23 1843 10/27/23 2123         Component Value Date/Time   SDES BLOOD LEFT ARM 05/10/2024 1652   SPECREQUEST  05/10/2024 1652    BOTTLES DRAWN AEROBIC AND ANAEROBIC Blood Culture results may not be optimal due to an inadequate volume of blood received in culture bottles   CULT  05/10/2024 1652    NO GROWTH 5 DAYS Performed at [facility], [address] [place], [name] [address]    REPTSTATUS 05/15/2024 FINAL 05/10/2024 1652  Procedures: Procedure(s) (LRB): CREATION, TRACHEOSTOMY  (N/A)  Mennie LAMY, MD [facility] 06/03/2024, 1:50 PM

## 2024-06-03 NOTE — Plan of Care (Signed)
  Problem: Intracerebral Hemorrhage Tissue Perfusion: Goal: Complications of Intracerebral Hemorrhage will be minimized Outcome: Progressing   Problem: Nutrition: Goal: Risk of aspiration will decrease Outcome: Progressing Goal: Dietary intake will improve Outcome: Progressing   Problem: Fluid Volume: Goal: Ability to maintain a balanced intake and output will improve Outcome: Progressing   Problem: Skin Integrity: Goal: Risk for impaired skin integrity will decrease Outcome: Progressing   Problem: Tissue Perfusion: Goal: Adequacy of tissue perfusion will improve Outcome: Progressing   Problem: Clinical Measurements: Goal: Ability to maintain clinical measurements within normal limits will improve Outcome: Progressing Goal: Will remain free from infection Outcome: Progressing Goal: Diagnostic test results will improve Outcome: Progressing Goal: Respiratory complications will improve Outcome: Progressing Goal: Cardiovascular complication will be avoided Outcome: Progressing   Problem: Activity: Goal: Risk for activity intolerance will decrease Outcome: Progressing   Problem: Nutrition: Goal: Adequate nutrition will be maintained Outcome: Progressing   Problem: Elimination: Goal: Will not experience complications related to bowel motility Outcome: Progressing Goal: Will not experience complications related to urinary retention Outcome: Progressing   Problem: Pain Managment: Goal: General experience of comfort will improve and/or be controlled Outcome: Progressing   Problem: Safety: Goal: Ability to remain free from injury will improve Outcome: Progressing   Problem: Skin Integrity: Goal: Risk for impaired skin integrity will decrease Outcome: Progressing   Problem: Activity: Goal: Ability to tolerate increased activity will improve Outcome: Progressing   Problem: Respiratory: Goal: Patent airway maintenance will improve Outcome: Progressing   Problem:  Activity: Goal: Ability to tolerate increased activity will improve Outcome: Progressing   Problem: Respiratory: Goal: Ability to maintain a clear airway and adequate ventilation will improve Outcome: Progressing   Problem: Role Relationship: Goal: Method of communication will improve Outcome: Progressing   Problem: Education: Goal: Knowledge of disease or condition will improve Outcome: Progressing   Problem: Intracerebral Hemorrhage Tissue Perfusion: Goal: Complications of Intracerebral Hemorrhage will be minimized Outcome: Progressing   Problem: Self-Care: Goal: Verbalization of feelings and concerns over difficulty with self-care will improve Outcome: Progressing Goal: Ability to communicate needs accurately will improve Outcome: Progressing   Problem: Nutrition: Goal: Risk of aspiration will decrease Outcome: Progressing Goal: Dietary intake will improve Outcome: Progressing   Problem: Education: Goal: Knowledge of disease or condition will improve Outcome: Progressing Goal: Knowledge of secondary prevention will improve (MUST DOCUMENT ALL) Outcome: Progressing Goal: Knowledge of patient specific risk factors will improve (DELETE if not current risk factor) Outcome: Progressing   Problem: Intracerebral Hemorrhage Tissue Perfusion: Goal: Complications of Intracerebral Hemorrhage will be minimized Outcome: Progressing   Problem: Coping: Goal: Will verbalize positive feelings about self Outcome: Progressing Goal: Will identify appropriate support needs Outcome: Progressing   Problem: Health Behavior/Discharge Planning: Goal: Ability to manage health-related needs will improve Outcome: Progressing Goal: Goals will be collaboratively established with patient/family Outcome: Progressing   Problem: Self-Care: Goal: Ability to participate in self-care as condition permits will improve Outcome: Progressing Goal: Verbalization of feelings and concerns over  difficulty with self-care will improve Outcome: Progressing Goal: Ability to communicate needs accurately will improve Outcome: Progressing   Problem: Nutrition: Goal: Risk of aspiration will decrease Outcome: Progressing Goal: Dietary intake will improve Outcome: Progressing

## 2024-06-03 NOTE — Progress Notes (Signed)
 Pt was placed on ATC 10L 35% @ 0733  but did not tolerate due to increase WOB. Pt placed back on full support via ventilator @ 0913. PRN given.  RN aware

## 2024-06-03 NOTE — TOC Progression Note (Addendum)
 Transition of Care [facility]) - Progression Note    Patient Details  Name: Garrett [name] MRN: [id number] Date of Birth: [birth year]  Transition of [facility]) CM/SW Contact  Lendia Dais, [place] Phone Number: 06/03/2024, 12:05 PM  Clinical Narrative: CSW spoke to [place] at [employer] in [place] and gave an update from the respiratory therapist. Per respiratory, patient would need to be on a continuous trach/vent. If on a continue vent, it would need to be on ACVC mode at least for 48 hours. CSW informed medical term via secure chat.  CSW called Pt's mother to update about placement status. CSW left a VM for a phone call.   1352 - CSW spoke to pt's Mother Garrett [name] via phone with an update for placement. CSW mentioned that [facility] in [place] could offer a bed once a continuous trach/vent. Garrett [name] requested the ratings from [email or web address], dt/ the rating being a 2 star out 5, Garrett [name] asked if there would be any other options. CSW explained that other facilities in the area declined due to not accepting the patient insurance or could not meet his needs, the next would be to explore out of state options in [place] .. Garrett [name] stated understanding. Garrett [name] stated she needed to talk to her husband first and will call back later on today.   CSW will continue to follow.     Expected Discharge Plan: Long Term Nursing Home Barriers to Discharge: Continued Medical Work up, SNF Pending Medicaid, SNF Pending bed offer, SNF Pending payor source - LOG, Inadequate or no insurance (New trach)               Expected Discharge Plan and Services In-house Referral: Clinical Social Work, Hospice / Palliative Care   Post Acute Care Choice: Skilled Nursing Facility Living arrangements for the past 2 months: Single Family Home                                       Social Drivers of Health (SDOH) Interventions SDOH Screenings   Food Insecurity: Patient Unable To Answer (10/29/2023)  Social Connections: Unknown  (06/23/2023)   Received from [facility]  Tobacco Use: High Risk (10/31/2023)    Readmission Risk Interventions     No data to display

## 2024-06-03 NOTE — Evaluation (Signed)
 RT Evaluate and Treat Note  06/03/2024   Breathing is (select one): Same as normal    The following was found on auscultation (select multiple):  Bilateral Breath Sounds: Clear;Diminished (06/03/24 1115)  R Upper  Breath Sounds: Clear;Diminished (06/03/24 1115) L Upper Breath Sounds: Clear;Diminished (06/03/24 1115) R Lower Breath Sounds: Diminished;Rhonchi (06/03/24 1115) L Lower Breath Sounds: Diminished;Rhonchi (06/03/24 1115)    Cough Assessment: Cough: Strong;Congested (06/03/24 0400)    Most Recent Chest Xray:... (No results found.    The following medications and/or interventions were ordered/changed/discontinued as part of the Respiratory Treatment protocol:   Medication Changes: No Respiratory therapy medications changes indicated at this time. PRN ALBUTEROL    Airway Clearance Changes: No changes indicated at this time. Pt Tracheal sx PRN   Oxygen Therapy Changes: Pt on Ventilator. ACVC recommeneded for LTAC placement.

## 2024-06-04 DIAGNOSIS — I613 Nontraumatic intracerebral hemorrhage in brain stem: Secondary | ICD-10-CM | POA: Diagnosis not present

## 2024-06-04 LAB — GLUCOSE, CAPILLARY
Glucose-Capillary: 128 mg/dL — ABNORMAL HIGH (ref 70–99)
Glucose-Capillary: 131 mg/dL — ABNORMAL HIGH (ref 70–99)
Glucose-Capillary: 140 mg/dL — ABNORMAL HIGH (ref 70–99)
Glucose-Capillary: 148 mg/dL — ABNORMAL HIGH (ref 70–99)

## 2024-06-04 NOTE — Plan of Care (Signed)
  Problem: Nutrition: Goal: Risk of aspiration will decrease Outcome: Progressing   Problem: Intracerebral Hemorrhage Tissue Perfusion: Goal: Complications of Intracerebral Hemorrhage will be minimized Outcome: Progressing   Problem: Nutrition: Goal: Risk of aspiration will decrease Outcome: Progressing   Problem: Intracerebral Hemorrhage Tissue Perfusion: Goal: Complications of Intracerebral Hemorrhage will be minimized Outcome: Progressing

## 2024-06-04 NOTE — Evaluation (Signed)
 RT Evaluate and Treat Note  06/04/2024   Breathing is (select one): Same as normal    The following was found on auscultation (select multiple):  Bilateral Breath Sounds: Diminished (06/04/24 0732)  R Upper  Breath Sounds: Diminished (06/03/24 2000) L Upper Breath Sounds: Diminished (06/03/24 2000) R Lower Breath Sounds: Diminished (06/03/24 2000) L Lower Breath Sounds: Diminished (06/03/24 2000)    Cough Assessment: Cough: Strong (06/04/24 0056)    Most Recent Chest Xray:... (Tracheostomy tube in satisfactory position. Poor depth of inspiration with improvement. Stable borderline enlarged cardiac silhouette. Decreased right basilar atelectasis. Clear left lung. Lower thoracic spine degenerative changes)   The following medications and/or interventions were ordered/changed/discontinued as part of the Respiratory Treatment protocol:   Medication Changes: Ipratropium Bromide Ordered PRN   Airway Clearance Changes: Cough & Deep Breath  Ordered   Oxygen Therapy Changes:none

## 2024-06-04 NOTE — TOC Progression Note (Addendum)
 Transition of Care [facility]) - Progression Note    Patient Details  Name: Garrett [name] MRN: [id number] Date of Birth: 01/18/1976  Transition of Care [facility]) CM/SW Contact  Lendia Dais, [place] Phone Number: 06/04/2024, 12:08 PM  Clinical Narrative: Pt's mother Garrett [name] returned phone call to CSW with a decision about [facility].  CSW explain that if they transition they will only take pt's if they are on a continuous trach/vent and that if the pt is placed on a continuous vent it would have to be on a certain vent mode for 48 hours. Garrett [name] stated that she does not want the patient to be on a continuous vent while they still can progress. CSW stated understanding and said that they would inform the MD/Respiratory to talk to her to further explain the pt being on a continuous vent.   CSW informed Respiratory MD and RN during progression.   CSW will continue to follow for update.    Expected Discharge Plan: Long Term Nursing Home Barriers to Discharge: Continued Medical Work up, SNF Pending Medicaid, SNF Pending bed offer, SNF Pending payor source - LOG, Inadequate or no insurance (New trach)               Expected Discharge Plan and Services In-house Referral: Clinical Social Work, Hospice / Palliative Care   Post Acute Care Choice: Skilled Nursing Facility Living arrangements for the past 2 months: Single Family Home                                       Social Drivers of Health (SDOH) Interventions SDOH Screenings   Food Insecurity: Patient Unable To Answer (10/29/2023)  Social Connections: Unknown (06/23/2023)   Received from [facility]  Tobacco Use: High Risk (10/31/2023)    Readmission Risk Interventions     No data to display

## 2024-06-04 NOTE — Progress Notes (Signed)
 PROGRESS NOTE   Garrett [name]  FMW:[id number]    DOB: 06-11-1976    DOA: 10/27/2023  PCP: Pcp, No   I have briefly reviewed patients previous medical records in [facility].   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  He was admitted for ICH with acute respiratory failure with hypoxia.  Initial BP 262/156.  Emergently intubated.  Admitted to neuro ICU.  Hospital course complicated by failed went with recurrent ileus, episodes of infection/colonization of respiratory tract with MRSA, repeated transfers to ICU, tracheostomy and PEG dislodgment.  Has had a prolonged hospital course, now status post tracheostomy and PEG tube, awaiting nursing home placement.  Please refer to progress note by Dr. Mennie LAMY on 10/14 for extensive details through course of hospitalization until then including significant events, procedures, consultations excetra.   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   Controlled now on metoprolol  tartrate 75 mg bid, amlodipine  10 and PRNs.  Continue management.   Acute on chronic resp failure w hypoxia and hypercapnea  Trach dependence-nocturnal ventilator dependence MRSA, Pseudomonas with trach Decannulation preparatory due to ineffective airway clearance Chronic  hypoventilation: S/p tracheostomy redo 7/16.  Not a candidate for decannulation given ineffective airway clearance. Continue trach collar during day with vent support at night. Continue trach care per protocol. Trach replaced last on 05/23/24. Pulmonary following weekly -last chest x-ray 10/13-poor depth of inspiration with decreased right basilar atelectasis 10/14 am Placed on ATC 10 L. 35%  but did not tolerate due to increasing work of breathing placed back on full vent support. Per TOC patient is to be on ACVC mode for at least 48 hours before patient can be accepted at the facility starting the  vent mode today.  As per discussion with RN and RT on 10/15, has been on AC/VC mode since 06/03/2024 at approximately 4 PM.  TOC note from 10/14 appreciated.   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 Mennie LAMY, [facility] MD note from 10/14. Currently on only artificial tears to both eyes. Stable.   Hypernatremia Resolved.Cont TF w/ free water  RD following, On 150 cc q 6 hr. Intermittently monitor labs   Elevated liver enzymes:  Mild.Unclear etiology.  Resolved.  CK normal, checking intermittently once a week.   Inadequate oral intake Estimated Body mass index is 31.17 kg/m.: PEG replaced by IR on 05/15/24 after becoming dislodged again RD is following, continue tube feeding and supplements and augment.  Anemia of chronic disease Stable.  Body mass index is 31.01 kg/m./Obesity class I. Complicates care.   DVT prophylaxis: enoxaparin  (LOVENOX ) injection 40 mg Start: 03/06/24 1000 SCDs Start: 10/27/23 2226     Code Status: Full Code:  Family Communication: Girlfriend at bedside. Disposition:  Status is: Inpatient Remains inpatient appropriate because: Vent dependent, PEG tube feeding, awaiting safe disposition.     Consultants:   Neurology PCCM  General surgery Ophthalmology  Procedures:   As per Dr. Mennie LAMY [facility] MD progress note from 10/14.  Subjective:   Patient evaluated while girlfriend was at bedside.  Patient is nonverbal but appears to be tracking activity around with his eyes.  She states that patient nods his head to yes or no but was unable to reproduce that during my visit.  She did not have any other complaints.  As per nursing, no active issues ongoing.  Reportedly does not tolerate spontaneous breathing on trach collar.  Objective:   Vitals:   06/04/24 0500 06/04/24 0600 06/04/24 0700 06/04/24 0748  BP: 105/76 106/85 109/80   Pulse: 63 (!) 58 (!) 56   Resp: 12 12 12    Temp:    97.6 F (36.4 C)  TempSrc:    Oral  SpO2: 100% 100% 100%   Weight:      Height:        General exam: Young male, moderately built and obese lying comfortably propped up in bed without distress. Neck: Tracheostomy with mechanical ventilation. Respiratory system: Clear to auscultation. Respiratory effort normal. Cardiovascular system: S1 & S2 heard, RRR. No JVD, murmurs, rubs, gallops or clicks. No pedal edema.  Telemetry personally reviewed: SB in the 40s-50s but mostly in sinus rhythm. Gastrointestinal system: Abdomen is nondistended, soft and nontender. No organomegaly or masses felt. Normal bowel sounds heard.  PEG tube site intact without acute findings. Central nervous system: Alert.  Nonverbal, not following instructions. No focal neurological deficits. Extremities: Did not see any movement of his extremities. Skin: No rashes, lesions or ulcers Psychiatry: Judgement and insight cannot be assessed. Mood & affect cannot be assessed.     Data Reviewed:   I have personally reviewed following labs and imaging studies   CBC: Recent Labs  Lab 05/29/24 1255  WBC 9.5  NEUTROABS 7.0  HGB 10.8*  HCT 36.0*  MCV 90.9  PLT 359    Basic Metabolic Panel: Recent Labs  Lab 05/29/24 1255  NA 137  K 4.1  CL 97*  CO2 30  GLUCOSE 118*  BUN 24*  CREATININE 0.42*  CALCIUM 8.9    Liver Function Tests: Recent Labs  Lab 05/29/24 1255  AST  34  ALT 79*  ALKPHOS 61  BILITOT 0.2  PROT 7.2  ALBUMIN  2.7*    CBG: Recent Labs  Lab 06/03/24 1130 06/03/24 1755 06/03/24 2311  GLUCAP 146* 159* 141*    Microbiology Studies:  No results found for this or any previous visit (from the past 240 hours).  Radiology Studies:  DG CHEST PORT 1 VIEW Result Date: 06/02/2024 CLINICAL DATA:  Hypoxia. EXAM: PORTABLE CHEST 1 VIEW COMPARISON:  05/25/2024 FINDINGS: Tracheostomy tube in satisfactory position. Poor depth of inspiration with improvement. Stable borderline enlarged cardiac silhouette. Decreased right basilar atelectasis. Clear left lung. Lower thoracic spine degenerative changes. IMPRESSION: Poor depth of inspiration with decreased right basilar atelectasis. Electronically Signed   By: Elspeth Bathe M.D.   On: 06/02/2024 10:21    Scheduled Meds:    amLODipine   10 mg Per Tube Daily   artificial tears   Both Eyes BID   Chlorhexidine  Gluconate Cloth  6 each Topical Daily   enoxaparin  (LOVENOX ) injection  40 mg Subcutaneous Daily   famotidine   20 mg Per Tube BID   feeding supplement (JEVITY 1.5 CAL/FIBER)  237 mL Per Tube 5 X Daily   feeding supplement (PROSource TF20)  60 mL Per Tube TID   free water   150 mL  Per Tube Q6H   insulin  aspart  0-9 Units Subcutaneous Q6H   metoprolol  tartrate  75 mg Per Tube BID   multivitamin with minerals  1 tablet Per Tube Daily   mouth rinse  15 mL Mouth Rinse Q2H   polyethylene glycol  17 g Per Tube Daily    Continuous Infusions:     LOS: 221 days     Trenda Mar, MD,  FACP, [facility], [facility], [facility]   [facility] Phys[facility] Advisor [place]      To contact the attending provider between 7A-7P or the covering provider during after hours 7P-7A, please log into the web site [email or web address] and access using universal [place] password for that web site. If you do not have the password, please call the hospital operator.  06/04/2024, 8:00 AM

## 2024-06-04 NOTE — Progress Notes (Signed)
 Pt was placed on ATC 10L 35% per CCM. Pt tolerating well at this time.

## 2024-06-05 DIAGNOSIS — I613 Nontraumatic intracerebral hemorrhage in brain stem: Secondary | ICD-10-CM | POA: Diagnosis not present

## 2024-06-05 LAB — GLUCOSE, CAPILLARY
Glucose-Capillary: 97 mg/dL (ref 70–99)
Glucose-Capillary: 138 mg/dL — ABNORMAL HIGH (ref 70–99)
Glucose-Capillary: 139 mg/dL — ABNORMAL HIGH (ref 70–99)
Glucose-Capillary: 84 mg/dL (ref 70–99)
Glucose-Capillary: 86 mg/dL (ref 70–99)

## 2024-06-05 NOTE — Evaluation (Signed)
 RT Evaluate and Treat Note  06/05/2024   Breathing is (select one): Same as normal    The following was found on auscultation (select multiple):  Bilateral Breath Sounds: Diminished (06/05/24 0850)  R Upper  Breath Sounds: Diminished (06/04/24 1642) L Upper Breath Sounds: Diminished (06/04/24 1642) R Lower Breath Sounds: Diminished (06/04/24 1642) L Lower Breath Sounds: Diminished (06/04/24 1642)    Cough Assessment: Cough: Congested;Productive;Strong (06/05/24 0850)    Most Recent Chest Xray:... (No results found.    The following medications and/or interventions were ordered/changed/discontinued as part of the Respiratory Treatment protocol:   Medication Changes: none    Airway Clearance Changes: none    Oxygen Therapy Changes: None

## 2024-06-05 NOTE — TOC Progression Note (Signed)
 Transition of Care [facility]) - Progression Note    Patient Details  Name: Garrett [name] MRN: [id number] Date of Birth: 05/24/1976  Transition of Care [facility]) CM/SW Contact  Lendia Dais, [place] Phone Number: 06/05/2024, 12:31 PM  Clinical Narrative:  Per respiratory MD, pt's mother [place] has declined the option of the pt being placed on a continuous vent d/t wanting the option of the pt to be weaned off the vent. CSW will start search of vent SNF's that will give the option to wean.  CSW will continue to monitor.    Expected Discharge Plan: Long Term Nursing Home Barriers to Discharge: Continued Medical Work up, SNF Pending Medicaid, SNF Pending bed offer, SNF Pending payor source - LOG, Inadequate or no insurance (New trach)               Expected Discharge Plan and Services In-house Referral: Clinical Social Work, Hospice / Palliative Care   Post Acute Care Choice: Skilled Nursing Facility Living arrangements for the past 2 months: Single Family Home                                       Social Drivers of Health (SDOH) Interventions SDOH Screenings   Food Insecurity: Patient Unable To Answer (10/29/2023)  Social Connections: Unknown (06/23/2023)   Received from [facility]  Tobacco Use: High Risk (10/31/2023)    Readmission Risk Interventions     No data to display

## 2024-06-05 NOTE — Plan of Care (Signed)
  Problem: Fluid Volume: Goal: Ability to maintain a balanced intake and output will improve Outcome: Progressing   Problem: Skin Integrity: Goal: Risk for impaired skin integrity will decrease Outcome: Progressing   Problem: Clinical Measurements: Goal: Ability to maintain clinical measurements within normal limits will improve Outcome: Progressing Goal: Will remain free from infection Outcome: Progressing   Problem: Nutrition: Goal: Dietary intake will improve Outcome: Not Progressing   Problem: Clinical Measurements: Goal: Respiratory complications will improve Outcome: Not Progressing   Problem: Self-Care: Goal: Ability to participate in self-care as condition permits will improve Outcome: Not Progressing Goal: Verbalization of feelings and concerns over difficulty with self-care will improve Outcome: Not Progressing Goal: Ability to communicate needs accurately will improve Outcome: Not Progressing

## 2024-06-05 NOTE — Progress Notes (Addendum)
 RT placed pt back on ventilator due to pt having an increase in WOB and tachypnea. trach cuff re-inflated, PRN tx given and pt suctioned.

## 2024-06-05 NOTE — Progress Notes (Signed)
 RT took pt off ventilator and placed pt on ATC 10L/40% with cuff deflated. Pt tolerating it well at this time. Vent on s/b at bedside.

## 2024-06-05 NOTE — Plan of Care (Signed)
  Problem: Nutrition: Goal: Risk of aspiration will decrease Outcome: Progressing   Problem: Nutrition: Goal: Dietary intake will improve Outcome: Progressing   Problem: Skin Integrity: Goal: Risk for impaired skin integrity will decrease Outcome: Progressing

## 2024-06-05 NOTE — Progress Notes (Signed)
 PROGRESS NOTE   Garrett [name]  FMW:[id number]    DOB: 1976-07-14    DOA: 10/27/2023  PCP: Pcp, No   I have briefly reviewed patients previous medical records in [facility].   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  He was admitted for ICH with acute respiratory failure with hypoxia.  Initial BP 262/156.  Emergently intubated.  Admitted to neuro ICU.  Hospital course complicated by failed went with recurrent ileus, episodes of infection/colonization of respiratory tract with MRSA, repeated transfers to ICU, tracheostomy and PEG dislodgment.  Has had a prolonged hospital course, now status post tracheostomy and PEG tube, awaiting nursing home placement.  Per ICM note from 10/15, there was an SNF that was willing to take patient but only if he remained on continuous trach/went (without weaning).  PCCM MD discussed with patient's mother who does not want patient to go to facility where he will not be weaned.  Patient has been denied by [facility] and safety inspector.  Therefore patient at this time remains in house with ongoing vent weaning trials.  Please refer to progress note by Dr. Mennie LAMY on 10/14 for extensive details through course of hospitalization until then including significant events, procedures, consultations excetra.   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   Controlled now on metoprolol  tartrate 75 mg bid, amlodipine  10 and PRNs.  Continue management.   Acute on chronic resp failure w hypoxia and hypercapnea  Trach dependence-nocturnal ventilator dependence MRSA, Pseudomonas with trach Decannulation preparatory due to ineffective airway clearance Chronic hypoventilation: S/p tracheostomy redo 7/16.  Not a candidate for decannulation given ineffective airway clearance. Continue trach collar during day with vent support at night. Continue trach care per protocol. Trach replaced last on 05/23/24. Pulmonary following weekly -last chest x-ray 10/13-poor depth of inspiration with decreased right basilar atelectasis 10/15: I discussed with Dr. Fernanda, PCCM MD.  He discussed with patient's mother yesterday.  She does not want patient to go to a SNF that will not do vent weaning (there was a facility that was looking to accept him if he had remained on continuous vent).  He also advised that patient had been denied by [facility].  Currently trying vent weaning trials.  Yesterday was placed on ATC 10 L 35% on 10/15 it appears from approximately 5 PM until 1:30 AM on 10/16.  Did get tachypneic and tachycardic on trach collar.   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 Mennie LAMY, [facility] MD note from 10/14. Currently on only artificial tears to both eyes. Stable.   Hypernatremia Resolved.Cont TF w/ free water  RD following, On 150 cc q 6 hr. Intermittently monitor labs.  Will order labs for tomorrow.   Elevated liver enzymes:  Mild.Unclear etiology.  Resolved.  CK normal, checking intermittently once a week.   Inadequate oral intake Estimated Body  mass index is 31.17 kg/m.: PEG replaced by IR on 05/15/24 after becoming dislodged again RD is following, continue tube feeding and supplements and augment.  Anemia of chronic  disease Stable.  Body mass index is 30.84 kg/m./Obesity class I. Complicates care.   DVT prophylaxis: enoxaparin  (LOVENOX ) injection 40 mg Start: 03/06/24 1000 SCDs Start: 10/27/23 2226     Code Status: Full Code:  Family Communication: None at bedside Disposition:  Status is: Inpatient Remains inpatient appropriate because: Vent dependent, PEG tube feeding, awaiting safe disposition.     Consultants:   Neurology PCCM General surgery Ophthalmology  Procedures:   As per Dr. Mennie LAMY [facility] MD progress note from 10/14.  Subjective:  Patient seen along with nurse tech in the room.  Eyes closed, not responding to call or touch.  As per discussion with NP, she reports that having known patient for some time now in the ICU, he does not respond with head nods indicating yes or no as was reported by girlfriend yesterday.  Objective:   Vitals:   06/05/24 0400 06/05/24 0500 06/05/24 0600 06/05/24 0748  BP: 120/88 121/87 (!) 122/94   Pulse: 76 75 72   Resp: 13 12 12    Temp:    98.4 F (36.9 C)  TempSrc:    Axillary  SpO2: 99% 99% 98%   Weight:  92 kg    Height:        General exam: Young male, moderately built and obese lying comfortably propped up in bed without distress. Neck: Tracheostomy with mechanical ventilation. Respiratory system: Clear to auscultation. Respiratory effort normal.  Drooling from right angle of the mouth.  ENT made aware for suctioning needs. Cardiovascular system: S1 & S2 heard, RRR. No JVD, murmurs, rubs, gallops or clicks. No pedal edema.  Telemetry personally reviewed: Sinus rhythm. Gastrointestinal system: Abdomen is nondistended, soft and nontender. No organomegaly or masses felt. Normal bowel sounds heard.  PEG tube site intact without acute findings. GU: Has Foley catheter. Central nervous system: Eyes closed, not responding to call or touch.  Per NT, this is how he is [place] most of the time. Extremities: Did not see any movement of his  extremities. Skin: No rashes, lesions or ulcers Psychiatry: Judgement and insight cannot be assessed. Mood & affect cannot be assessed.     Data Reviewed:   I have personally reviewed following labs and imaging studies   CBC: Recent Labs  Lab 05/29/24 1255  WBC 9.5  NEUTROABS 7.0  HGB 10.8*  HCT 36.0*  MCV 90.9  PLT 359    Basic Metabolic Panel: Recent Labs  Lab 05/29/24 1255  NA 137  K 4.1  CL 97*  CO2 30  GLUCOSE 118*  BUN 24*  CREATININE 0.42*  CALCIUM 8.9    Liver Function Tests: Recent Labs  Lab 05/29/24 1255  AST 34  ALT 79*  ALKPHOS 61  BILITOT 0.2  PROT 7.2  ALBUMIN  2.7*    CBG: Recent Labs  Lab 06/04/24 1116 06/04/24 1815 06/04/24 2345  GLUCAP 140* 128* 131*    Microbiology Studies:  No results found for this or any previous visit (from the past 240 hours).  Radiology Studies:  No results found.   Scheduled Meds:    amLODipine   10 mg Per Tube Daily   artificial tears   Both Eyes BID   Chlorhexidine  Gluconate Cloth  6 each Topical Daily   enoxaparin  (LOVENOX ) injection  40 mg Subcutaneous Daily   famotidine   20 mg Per  Tube BID   feeding supplement (JEVITY 1.5 CAL/FIBER)  237 mL Per Tube 5 X Daily   feeding supplement (PROSource TF20)  60 mL Per Tube TID   free water   150 mL Per Tube Q6H   insulin  aspart  0-9 Units Subcutaneous Q6H   metoprolol  tartrate  75 mg Per Tube BID   multivitamin with minerals  1 tablet Per Tube Daily   mouth rinse  15 mL Mouth Rinse Q2H   polyethylene glycol  17 g Per Tube Daily    Continuous Infusions:     LOS: 222 days     Trenda Mar, MD,  FACP, [facility], [facility], [facility]   [facility] Physician Advisor [place]      To contact the attending provider between 7A-7P or the covering provider during after hours 7P-7A, please log into the web site [email or web address] and access using universal [place] password for that web site. If you do not have the password, please call the  hospital operator.  06/05/2024, 7:58 AM

## 2024-06-06 DIAGNOSIS — I613 Nontraumatic intracerebral hemorrhage in brain stem: Secondary | ICD-10-CM | POA: Diagnosis not present

## 2024-06-06 LAB — COMPREHENSIVE METABOLIC PANEL WITH GFR
ALT: 56 U/L — ABNORMAL HIGH (ref 0–44)
AST: 26 U/L (ref 15–41)
Albumin: 2.8 g/dL — ABNORMAL LOW (ref 3.5–5.0)
Alkaline Phosphatase: 59 U/L (ref 38–126)
Anion gap: 10 (ref 5–15)
BUN: 21 mg/dL — ABNORMAL HIGH (ref 6–20)
CO2: 28 mmol/L (ref 22–32)
Calcium: 9.1 mg/dL (ref 8.9–10.3)
Chloride: 100 mmol/L (ref 98–111)
Creatinine, Ser: 0.47 mg/dL — ABNORMAL LOW (ref 0.61–1.24)
GFR, Estimated: >60 mL/min (ref >60)
Glucose, Bld: 137 mg/dL — ABNORMAL HIGH (ref 70–99)
Potassium: 4.2 mmol/L (ref 3.5–5.1)
Sodium: 138 mmol/L (ref 135–145)
Total Bilirubin: 0.3 mg/dL (ref 0.0–1.2)
Total Protein: 7.2 g/dL (ref 6.5–8.1)

## 2024-06-06 LAB — CBC
HCT: 32.8 % — ABNORMAL LOW (ref 39.0–52.0)
Hemoglobin: 10.1 g/dL — ABNORMAL LOW (ref 13.0–17.0)
MCH: 27.8 pg (ref 26.0–34.0)
MCHC: 30.8 g/dL (ref 30.0–36.0)
MCV: 90.4 fL (ref 80.0–100.0)
Platelets: 372 K/uL (ref 150–400)
RBC: 3.63 MIL/uL — ABNORMAL LOW (ref 4.22–5.81)
RDW: 16.2 % — ABNORMAL HIGH (ref 11.5–15.5)
WBC: 7.6 K/uL (ref 4.0–10.5)
nRBC: 0 % (ref 0.0–0.2)

## 2024-06-06 LAB — GLUCOSE, CAPILLARY
Glucose-Capillary: 98 mg/dL (ref 70–99)
Glucose-Capillary: 126 mg/dL — ABNORMAL HIGH (ref 70–99)
Glucose-Capillary: 127 mg/dL — ABNORMAL HIGH (ref 70–99)
Glucose-Capillary: 97 mg/dL (ref 70–99)
Glucose-Capillary: 130 mg/dL — ABNORMAL HIGH (ref 70–99)

## 2024-06-06 LAB — CK: Total CK: 97 U/L (ref 49–397)

## 2024-06-06 NOTE — TOC Progression Note (Signed)
 Transition of Care [facility]) - Progression Note    Patient Details  Name: Garrett [name] MRN: [id number] Date of Birth: 1976/06/21  Transition of Care [facility]) CM/SW Contact  Lendia Dais, [place] Phone Number: 06/06/2024, 11:53 AM  Clinical Narrative: CSW spoke to pt's mother Shawnee and Shawnee stated that she still wants the option for the patient to be able to wean off the ventilator. CSW mentioned [facility] in [place], [place] inquired of the medicare.gov rating which is a 1 star. Shawnee stated that she does not want a one star facility. CSW mentioned that many facilities do not take medicaid which can limit our options. Shawnee stated understanding.  CSW stated that there are other facilities in the [place] areas. CSW offered to send medicare.gov list of facilities to [employer] and Shawnee was agreeable. List emailed to [email or web address].   CSW will continue to follow.     Expected Discharge Plan: Long Term Nursing Home Barriers to Discharge: Continued Medical Work up, SNF Pending Medicaid, SNF Pending bed offer, SNF Pending payor source - LOG, Inadequate or no insurance (New trach)               Expected Discharge Plan and Services In-house Referral: Clinical Social Work, Hospice / Palliative Care   Post Acute Care Choice: Skilled Nursing Facility Living arrangements for the past 2 months: Single Family Home                                       Social Drivers of Health (SDOH) Interventions SDOH Screenings   Food Insecurity: Patient Unable To Answer (10/29/2023)  Social Connections: Unknown (06/23/2023)   Received from [facility]  Tobacco Use: High Risk (10/31/2023)    Readmission Risk Interventions     No data to display

## 2024-06-06 NOTE — Progress Notes (Signed)
 RT took pt off ventilator , deflated cuff and placed pt on ATC 10L/40%. Pt tolerating it well at this time. Vent on s/b at bedside if needed.

## 2024-06-06 NOTE — Plan of Care (Signed)
  Problem: Fluid Volume: Goal: Ability to maintain a balanced intake and output will improve Outcome: Progressing   Problem: Skin Integrity: Goal: Risk for impaired skin integrity will decrease Outcome: Progressing   Problem: Tissue Perfusion: Goal: Adequacy of tissue perfusion will improve Outcome: Progressing   Problem: Clinical Measurements: Goal: Ability to maintain clinical measurements within normal limits will improve Outcome: Progressing   Problem: Clinical Measurements: Goal: Will remain free from infection Outcome: Progressing   Problem: Skin Integrity: Goal: Risk for impaired skin integrity will decrease Outcome: Progressing   Plan of care, assessment, monitoring, treatment, and intervention (s)  ongoing

## 2024-06-06 NOTE — Evaluation (Signed)
 RT Evaluate and Treat Note  06/06/2024   Breathing is (select one): Same as normal    The following was found on auscultation (select multiple):  Bilateral Breath Sounds: Diminished (06/06/24 0400)  R Upper  Breath Sounds: Diminished (06/04/24 1642) L Upper Breath Sounds: Diminished (06/04/24 1642) R Lower Breath Sounds: Diminished (06/04/24 1642) L Lower Breath Sounds: Diminished (06/04/24 1642)    Cough Assessment: Cough: Congested;Productive (06/05/24 2338)    Most Recent Chest Xray:... (No results found.    The following medications and/or interventions were ordered/changed/discontinued as part of the Respiratory Treatment protocol:   Medication Changes: None    Airway Clearance Changes: None    Oxygen Therapy Changes: None

## 2024-06-06 NOTE — Progress Notes (Signed)
 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  He was admitted for ICH with acute respiratory failure with hypoxia.  Initial BP 262/156.  Emergently intubated.  Admitted to neuro ICU.  Hospital course complicated by failed went with recurrent ileus, episodes of infection/colonization of respiratory tract with MRSA, repeated transfers to ICU, tracheostomy and PEG dislodgment.   Has had a prolonged hospital course, now status post tracheostomy and PEG tube, awaiting nursing home placement.   Per ICM note from 10/15, there was an SNF that was willing to take patient but only if he remained on continuous trach/went (without weaning).  PCCM MD discussed with patient's mother who does not want patient to go to facility where he will not be weaned.  Patient has been denied by [facility] and safety inspector.  Therefore patient at this time remains in house with ongoing vent weaning trials.   Please refer to progress note by Dr. Mennie LAMY on 10/14 for extensive details through course of hospitalization until then including significant events, procedures, consultations excetra.     Subjective:  Discussed with nurse, patient did not do well with trach trial today.  He was placed back on his ventilator and was much more comfortable.  Patient's fianc was at bedside and noted that he was at baseline.   Objective: Vitals:   06/06/24 1507 06/06/24 1519 06/06/24 1600 06/06/24 1700  BP:   125/89 120/87  Pulse:  66 72 72  Resp:  12 17 13   Temp: 98.6 F (37 C)     TempSrc: Axillary     SpO2:   98% 99% 100%  Weight:      Height:        Intake/Output Summary (Last 24 hours) at 06/06/2024 1834 Last data filed at 06/06/2024 1832 Gross per 24 hour  Intake 1047 ml  Output 1150 ml  Net -103 ml   Filed Weights   06/04/24 0406 06/05/24 0500 06/06/24 0500  Weight: 92.5 kg 92 kg 97 kg     Exam:  General: Patient with eyes open but no focus, does not look at me when I speak to him Eyes: Left eye with significant erythema CVS: S1-S2, regular  Respiratory: Mechanical breath sounds GI: NABS, soft, NT  LE: Warm and well-perfused  Data Reviewed:  Basic Metabolic Panel: Recent Labs  Lab 06/06/24 1306  NA 138  K 4.2  CL 100  CO2 28  GLUCOSE 137*  BUN 21*  CREATININE 0.47*  CALCIUM 9.1    CBC: Recent Labs  Lab 06/06/24 1306  WBC 7.6  HGB 10.1*  HCT 32.8*  MCV 90.4  PLT 372     Scheduled Meds:  amLODipine   10 mg Per Tube Daily   artificial tears   Both Eyes BID   Chlorhexidine  Gluconate Cloth  6 each Topical Daily   enoxaparin  (LOVENOX ) injection  40 mg Subcutaneous Daily   famotidine   20 mg Per Tube BID   feeding supplement (JEVITY 1.5 CAL/FIBER)  237 mL Per Tube 5 X Daily   feeding supplement (PROSource TF20)  60 mL Per Tube TID  free water   150 mL Per Tube Q6H   insulin  aspart  0-9 Units Subcutaneous Q6H   metoprolol  tartrate  75 mg Per Tube BID   multivitamin with minerals  1 tablet Per Tube Daily   mouth rinse  15 mL Mouth Rinse Q2H   polyethylene glycol  17 g Per Tube Daily   Continuous Infusions:   Assessment & Plan:   Continue present management/therapeutic regimen. Need to continue to have discussions with patient's mother about realistic goals for care. [name] did not want to have patient go to an LTAC that would not try to wean him. Case management continues to work actively find a good placement for the patient.   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   Controlled now on metoprolol  tartrate 75 mg bid, amlodipine  10 and PRNs.  Continue management.   Acute on chronic resp failure w hypoxia and hypercapnea  Trach dependence-nocturnal ventilator dependence MRSA, Pseudomonas with trach Decannulation preparatory due to ineffective airway clearance Chronic hypoventilation: S/p tracheostomy redo 7/16.  Not a candidate for decannulation given ineffective airway clearance. Continue trach collar during day with vent support at night. Continue trach care per protocol. Trach replaced last on 05/23/24. Pulmonary following weekly -last chest x-ray 10/13-poor depth of inspiration with decreased right basilar atelectasis 10/15: I discussed with Dr. Fernanda, PCCM MD.  He discussed with patient's mother yesterday.  She does not want patient to go to a SNF that will not do vent weaning (there was a facility that was looking to accept him if he had remained on continuous vent).  He also advised that [facility] had been [facility].  Currently trying vent weaning trials.  Yesterday was placed on ATC 10 L 35% on 10/15 it appears from approximately 5 PM until 1:30 AM on 10/16.  Did get tachypneic and tachycardic on trach collar.   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 Mennie LAMY, [facility] MD note from 10/14. Currently on only artificial tears to both eyes. Stable.   Hypernatremia Resolved.Cont TF w/ free water  RD following, On 150 cc q 6 hr. Intermittently monitor labs.  Will order labs for tomorrow.   Elevated liver enzymes:   Mild.Unclear etiology.  Resolved.  CK normal, checking intermittently once a week.   Inadequate oral intake Estimated Body mass index is 31.17 kg/m.: PEG replaced by IR on 05/15/24 after becoming dislodged again RD is following, continue tube feeding and supplements and augment.   Anemia of chronic disease Stable.   DVT prophylaxis: Lovenox  Code Status: Full Family Communication: Patient's fianc of 16 years was at bedside throughout     Studies: No results found.  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

## 2024-06-06 NOTE — Progress Notes (Addendum)
 CSW has called the following vent SNF's:  [facility]: Spoke to [place] and stated that the patient would have to have [place]  Medicaid in order to be accepted at their facility. needs to review documents with clinical. Respiratory setting for vent, H&P, recent labs.   [facility] [place], [place], [phone]): CSW spoke to [place] who referred the CSW to Randall Lee [phone]. CSW left VM.   [address] Randall returned CSW's phone call. Randall stated they could accept the patient and requested referral info of Nutrition notes, vent flows sheets within 72 hours, recent labs, progress notes, and 72 hours MAR. Randall stated that the pt would have to [facility] and stated that she will fill out the  medicaid application and needs the family to submit financial documents. Randall stated that the patient can come medicaid pending and would need to be a [place] resident, pt would become a resident after a one night stay. Needs wound notes if necessary, 45 minutes from [place]. Fax # [phone].  1400 - Referral sent to [facility] via fax.  [facility] [place], [place], [phone]): CSW left a VM for [employer] of social services and admissions.  [facility] [place] [facility], [place], [phone]): CSW spoke to  [place] and requested the referral information of a H&P, respiratory note w/ vent settings, MAR, dietary, progress notes, [email or web address]. Tammy states the patient has to be on a peg tube and a trach. Referral emailed to Tammy  CSW will continue to follow.

## 2024-06-07 DIAGNOSIS — I613 Nontraumatic intracerebral hemorrhage in brain stem: Secondary | ICD-10-CM | POA: Diagnosis not present

## 2024-06-07 MED ORDER — ORAL CARE MOUTH RINSE: 15.0000 mL | OROMUCOSAL | Status: DC | PRN

## 2024-06-07 MED ORDER — ORAL CARE MOUTH RINSE: 15.0000 mL | OROMUCOSAL | Status: DC

## 2024-06-07 NOTE — Plan of Care (Signed)
  Problem: Fluid Volume: Goal: Ability to maintain a balanced intake and output will improve Outcome: Progressing   Problem: Skin Integrity: Goal: Risk for impaired skin integrity will decrease Outcome: Progressing   Problem: Tissue Perfusion: Goal: Adequacy of tissue perfusion will improve Outcome: Progressing   Problem: Clinical Measurements: Goal: Will remain free from infection Outcome: Progressing   Problem: Clinical Measurements: Goal: Diagnostic test results will improve Outcome: Progressing   Plan of care, assessment, monitoring, treatment, and intervention (s)  ongoing, see MAR see flowsheet

## 2024-06-07 NOTE — Progress Notes (Signed)
 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  He was admitted for ICH with acute respiratory failure with hypoxia.  Initial BP 262/156.  Emergently intubated.  Admitted to neuro ICU.  Hospital course complicated by failed went with recurrent ileus, episodes of infection/colonization of respiratory tract with MRSA, repeated transfers to ICU, tracheostomy and PEG dislodgment.   Has had a prolonged hospital course, now status post tracheostomy and PEG tube, awaiting nursing home placement.   Per ICM note from 10/15, there was an SNF that was willing to take patient but only if he remained on continuous trach/went (without weaning).  PCCM MD discussed with patient's mother who does not want patient to go to facility where he will not be weaned.  Patient has been denied by [facility] and safety inspector.  Therefore patient at this time remains in house with ongoing vent weaning trials.   Please refer to progress note by Dr. Mennie LAMY on 10/14 for extensive details through course of hospitalization until then including significant events, procedures, consultations excetra.     Subjective:  Patient lying quietly in bed, nonverbal   Objective: Vitals:   06/07/24 1800 06/07/24 1900 06/07/24 2000 06/07/24 2009  BP: 113/85 129/89 (!) 125/91   Pulse: 82 79 87 91  Resp: 12 14 19 18   Temp:   99.2 F (37.3 C)   TempSrc:   Axillary   SpO2: 98% 98% 99% 99%  Weight:      Height:        Intake/Output Summary (Last 24 hours) at 06/07/2024 2053 Last data filed at 06/07/2024 2000 Gross per  24 hour  Intake 600 ml  Output 350 ml  Net 250 ml   Filed Weights   06/05/24 0500 06/06/24 0500 06/07/24 0423  Weight: 92 kg 97 kg 92 kg     Exam:  General: Patient sleeping comfortably on vent Eyes: Left eye with significant erythema CVS: S1-S2, regular  Respiratory: Mechanical breath sounds GI: NABS, soft, NT  LE: Warm and well-perfused  Data Reviewed:  Basic Metabolic Panel: Recent Labs  Lab 06/06/24 1306  NA 138  K 4.2  CL 100  CO2 28  GLUCOSE 137*  BUN 21*  CREATININE 0.47*  CALCIUM 9.1    CBC: Recent Labs  Lab 06/06/24 1306  WBC 7.6  HGB 10.1*  HCT 32.8*  MCV 90.4  PLT 372     Scheduled Meds:  amLODipine   10 mg Per Tube Daily   artificial tears   Both Eyes BID   Chlorhexidine  Gluconate Cloth  6 each Topical Daily   enoxaparin  (LOVENOX ) injection  40 mg Subcutaneous Daily   famotidine   20 mg Per Tube BID   feeding supplement (JEVITY 1.5 CAL/FIBER)  237 mL Per Tube 5 X Daily   feeding supplement (PROSource TF20)  60 mL Per Tube TID   free water   150 mL Per Tube Q6H   insulin  aspart  0-9 Units Subcutaneous Q6H   metoprolol  tartrate  75 mg Per Tube BID   multivitamin with minerals  1 tablet Per Tube Daily   mouth rinse  15 mL Mouth Rinse Q2H   polyethylene glycol  17 g Per Tube Daily   Continuous Infusions:   Assessment & Plan:   Continue present management/therapeutic regimen. Need to continue to have discussions with patient's mother about realistic goals for care. [name] did not want to have patient go to an LTAC that would not try to wean him. Case management continues to work actively find a good placement for the patient.   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   Controlled now on metoprolol  tartrate 75 mg bid, amlodipine  10 and PRNs.  Continue management.   Acute on chronic resp failure w hypoxia and hypercapnea  Trach dependence-nocturnal ventilator dependence MRSA, Pseudomonas with trach Decannulation preparatory due to ineffective airway clearance Chronic hypoventilation: S/p tracheostomy redo 7/16.  Not a candidate for decannulation given ineffective airway clearance. Continue trach collar during day with vent support at night. Continue trach care per protocol. Trach replaced last on 05/23/24. Pulmonary following weekly -last chest x-ray 10/13-poor depth of inspiration with decreased right basilar atelectasis 10/15: I discussed with Dr. Fernanda, PCCM MD.  He discussed with patient's mother yesterday.  She does not want patient to go to a SNF that will not do vent weaning (there was a facility that was looking to accept him if he had remained on continuous vent).  He also advised that [facility] had been [facility].  Currently trying vent weaning trials.  Yesterday was placed on ATC 10 L 35% on 10/15 it appears from approximately 5 PM until 1:30 AM on 10/16.  Did get tachypneic and tachycardic on trach collar.   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 Mennie LAMY, [facility] MD note from 10/14. Currently on only artificial tears to both eyes. Stable.   Hypernatremia Resolved.Cont TF w/ free water  RD following, On 150 cc q 6 hr. Intermittently monitor labs.  Will order labs for tomorrow.   Elevated liver enzymes:  Mild.Unclear etiology.  Resolved.  CK normal, checking intermittently once a week.   Inadequate oral intake Estimated Body mass index is 31.17 kg/m.: PEG replaced by IR on 05/15/24 after becoming  dislodged again RD is following, continue tube feeding and supplements and augment.   Anemia of chronic disease Stable.   DVT prophylaxis: Lovenox  Code Status: Full Family Communication: Patient's fianc of 16 years was at bedside throughout     Studies: No results found.  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

## 2024-06-07 NOTE — Evaluation (Signed)
 RT Evaluate and Treat Note  06/07/2024   Breathing is (select one): Same as normal    The following was found on auscultation (select multiple):  Bilateral Breath Sounds: Diminished (06/07/24 0839)  R Upper  Breath Sounds: Diminished (06/07/24 0839) L Upper Breath Sounds: Diminished (06/07/24 0839) R Lower Breath Sounds: Diminished (06/07/24 0839) L Lower Breath Sounds: Diminished (06/07/24 0839)    Cough Assessment: Cough: Productive (06/07/24 0839)    Most Recent Chest Xray:... (No results found.    The following medications and/or interventions were ordered/changed/discontinued as part of the Respiratory Treatment protocol:   Medication Changes: None   Airway Clearance Changes: None   Oxygen Therapy Changes: None

## 2024-06-08 DIAGNOSIS — I613 Nontraumatic intracerebral hemorrhage in brain stem: Secondary | ICD-10-CM | POA: Diagnosis not present

## 2024-06-08 NOTE — Evaluation (Signed)
 RT Evaluate and Treat Note  06/08/2024   Breathing is (select one): Same as normal    The following was found on auscultation (select multiple):  Bilateral Breath Sounds: Diminished (06/08/24 0857)  R Upper  Breath Sounds: Diminished (06/08/24 0857) L Upper Breath Sounds: Diminished (06/08/24 0857) R Lower Breath Sounds: Diminished (06/08/24 0857) L Lower Breath Sounds: Diminished (06/08/24 0857)    Cough Assessment: Cough: Productive (06/08/24 0857)    Most Recent Chest Xray:... (No results found.    The following medications and/or interventions were ordered/changed/discontinued as part of the Respiratory Treatment protocol:   Medication Changes: None   Airway Clearance Changes: None, continue trach suctioning   Oxygen Therapy Changes: None

## 2024-06-08 NOTE — Plan of Care (Signed)
  Problem: Nutrition: Goal: Adequate nutrition will be maintained Outcome: Progressing   Problem: Safety: Goal: Ability to remain free from injury will improve Outcome: Progressing   Problem: Skin Integrity: Goal: Risk for impaired skin integrity will decrease Outcome: Progressing   Problem: Health Behavior/Discharge Planning: Goal: Goals will be collaboratively established with patient/family Outcome: Progressing  Plan of care,  monitoring, treatment, assessment, and intervention (s)  ongoing, see MAR see flowsheet

## 2024-06-08 NOTE — Progress Notes (Signed)
 PROGRESS NOTE   Garrett [name]  FMW:[id number]    DOB: 04-28-1976    DOA: 10/27/2023  PCP: Pcp, No   I have briefly reviewed patients previous medical records in [facility]   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  He was admitted for ICH with acute respiratory failure with hypoxia.  Initial BP 262/156.  Emergently intubated.  Admitted to neuro ICU.  Hospital course complicated by failed went with recurrent ileus, episodes of infection/colonization of respiratory tract with MRSA, repeated transfers to ICU, tracheostomy and PEG dislodgment.  Has had a prolonged hospital course, now status post tracheostomy and PEG tube, awaiting nursing home placement.  Per ICM note from 10/15, there was an SNF that was willing to take patient but only if he remained on continuous trach/went (without weaning).  PCCM MD discussed with patient's mother who does not want patient to go to facility where he will not be weaned.  Patient has been denied by [facility] and safety inspector.  Therefore patient at this time remains in house with ongoing vent weaning trials.  Please refer to progress note by Dr. Mennie LAMY on 10/14 for extensive details through course of hospitalization until then including significant events, procedures, consultations excetra.   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   Hypertensive emergency h/o uncontrolled hypertension: Initial blood pressure was over 260 systolic.   Controlled now on metoprolol  tartrate 75 mg bid, amlodipine  10 and PRNs.  Continue management.   Acute on chronic resp failure w hypoxia and hypercapnea  Trach dependence-nocturnal ventilator dependence MRSA, Pseudomonas with trach Decannulation preparatory due to ineffective airway clearance Chronic hypoventilation: S/p tracheostomy redo 7/16.  Not a candidate for decannulation given ineffective airway clearance. Continue trach collar during day with vent support at night. Continue trach care per protocol. Trach replaced last on 05/23/24. Pulmonary following weekly -last chest x-ray 10/13-poor depth of inspiration with decreased right basilar atelectasis 10/15: I discussed with Dr. Fernanda, PCCM MD.  He discussed with patient's mother 10/14.  She does not want patient to go to a SNF that will not do vent weaning (there was a facility that was looking to accept him if he had remained on continuous vent).  He also advised that patient had been [facility] by [facility].  Currently trying vent weaning trials.    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 Mennie LAMY, [facility] MD note from 10/14. Currently on only artificial tears to both eyes. Stable.   Hypernatremia Resolved.Cont TF w/ free water  RD following, On 150 cc q 6 hr. Intermittently monitor labs.  Periodically check labs may be once weekly while here.   Elevated liver enzymes:  Mild.Unclear etiology.  Resolved.  CK normal.  ALT 56 but almost back to normal.   Inadequate oral intake Estimated Body mass index is  31.17 kg/m.: PEG replaced by IR on 05/15/24 after becoming dislodged again RD is following, continue tube feeding and supplements and augment.  Anemia of chronic disease Stable.  Body mass index  is 31.41 kg/m./Obesity class I. Complicates care.   DVT prophylaxis: enoxaparin  (LOVENOX ) injection 40 mg Start: 03/06/24 1000 SCDs Start: 10/27/23 2226     Code Status: Full Code:  Family Communication: None at bedside Disposition:  Status is: Inpatient Remains inpatient appropriate because: Vent dependent, PEG tube feeding, awaiting safe disposition.     Consultants:   Neurology PCCM General surgery Ophthalmology  Procedures:   As per Dr. Mennie LAMY [facility] MD progress note from 10/14.  Subjective:  Patient nonverbal and noncommunicative.  Discussed with RN.  He was on trach collar for about 3 hours yesterday.  She does not think that patient tracks activity with his eyes.  At times will turn his head to the side of noise or call.  Objective:   Vitals:   06/08/24 0700 06/08/24 0714 06/08/24 0800 06/08/24 0857  BP: 105/80  107/81   Pulse: 79  78 74  Resp: 11  12 (!) 24  Temp:  98.7 F (37.1 C)    TempSrc:  Oral    SpO2: 99%  100% 100%  Weight:      Height:        General exam: Young male, moderately built and obese lying comfortably propped up in bed without distress. Neck: Tracheostomy with mechanical ventilation ongoing. Respiratory system: Clear to auscultation. Respiratory effort normal.  No increased work of breathing.  Currently on mechanical ventilation via tracheostomy. Cardiovascular system: S1 & S2 heard, RRR. No JVD, murmurs, rubs, gallops or clicks. No pedal edema.  Telemetry personally reviewed: Sinus rhythm.  Stable. Gastrointestinal system: Abdomen is nondistended, soft and nontender. No organomegaly or masses felt. Normal bowel sounds heard.  PEG tube site intact without acute findings. GU: Has Foley catheter. Central nervous system: Eyes open, felt like he is tracking activity with his eyes but no startle response.  Cloudy left cornea.  Not much conjunctival congestion. Extremities: Quadriparesis without any movements that were not tested. Skin: No  rashes, lesions or ulcers Psychiatry: Judgement and insight impaired. Mood & affect cannot be assessed.     Data Reviewed:   I have personally reviewed following labs and imaging studies   CBC: Recent Labs  Lab 06/06/24 1306  WBC 7.6  HGB 10.1*  HCT 32.8*  MCV 90.4  PLT 372    Basic Metabolic Panel: Recent Labs  Lab 06/06/24 1306  NA 138  K 4.2  CL 100  CO2 28  GLUCOSE 137*  BUN 21*  CREATININE 0.47*  CALCIUM 9.1    Liver Function Tests: Recent Labs  Lab 06/06/24 1306  AST 26  ALT 56*  ALKPHOS 59  BILITOT 0.3  PROT 7.2  ALBUMIN  2.8*    CBG: Recent Labs  Lab 06/06/24 1203 06/06/24 1826 06/06/24 2305  GLUCAP 127* 97 130*    Microbiology Studies:  No results found for this or any previous visit (from the past 240 hours).  Radiology Studies:  No results found.   Scheduled Meds:    amLODipine   10 mg Per Tube Daily   artificial tears   Both Eyes BID   Chlorhexidine  Gluconate Cloth  6 each Topical Daily   enoxaparin  (LOVENOX ) injection  40 mg Subcutaneous Daily   famotidine   20 mg Per Tube BID   feeding supplement (JEVITY 1.5 CAL/FIBER)  237 mL Per Tube  5 X Daily   feeding supplement (PROSource TF20)  60 mL Per Tube TID   free water   150 mL Per Tube Q6H   insulin  aspart  0-9 Units Subcutaneous Q6H   metoprolol  tartrate  75 mg Per Tube BID   multivitamin with minerals  1 tablet Per Tube Daily   mouth rinse  15 mL Mouth Rinse Q2H   polyethylene glycol  17 g Per Tube Daily    Continuous Infusions:     LOS: 225 days     Trenda Mar, MD,  FACP, [facility], [facility], [facility]   [facility] Ad[facility] [place]      To contact the attending provider between 7A-7P or the covering provider during after hours 7P-7A, please log into the web site [email or web address] and access using universal [place] password for that web site. If you do not have the password, please call the hospital operator.  06/08/2024, 9:19 AM

## 2024-06-08 NOTE — Procedures (Signed)
 Tracheostomy Change Note  Patient Details:   Name: Garrett [name] DOB: 16-Jan-1976 MRN: [id number]    Airway Documentation: Routine trach change performed with assistance from [place] RRT. 6 cuffed shiley replaced with a new 6 cuffed shiley. Placement confirmed by positive color change on EZCAP, bilateral BS, positive chest rise, & also secretions coughed out by patient after change.  Evaluation  O2 sats: stable throughout Complications: No apparent complications Patient did tolerate procedure well. Bilateral Breath Sounds: Diminished    [name] 06/08/2024, 12:41 PM

## 2024-06-09 DIAGNOSIS — G835 Locked-in state: Secondary | ICD-10-CM | POA: Diagnosis not present

## 2024-06-09 DIAGNOSIS — Z930 Tracheostomy status: Secondary | ICD-10-CM | POA: Diagnosis not present

## 2024-06-09 DIAGNOSIS — J9622 Acute and chronic respiratory failure with hypercapnia: Secondary | ICD-10-CM | POA: Diagnosis not present

## 2024-06-09 DIAGNOSIS — J9621 Acute and chronic respiratory failure with hypoxia: Secondary | ICD-10-CM | POA: Diagnosis not present

## 2024-06-09 DIAGNOSIS — I613 Nontraumatic intracerebral hemorrhage in brain stem: Secondary | ICD-10-CM | POA: Diagnosis not present

## 2024-06-09 LAB — GLUCOSE, CAPILLARY
Glucose-Capillary: 102 mg/dL — ABNORMAL HIGH (ref 70–99)
Glucose-Capillary: 112 mg/dL — ABNORMAL HIGH (ref 70–99)
Glucose-Capillary: 121 mg/dL — ABNORMAL HIGH (ref 70–99)
Glucose-Capillary: 143 mg/dL — ABNORMAL HIGH (ref 70–99)
Glucose-Capillary: 146 mg/dL — ABNORMAL HIGH (ref 70–99)
Glucose-Capillary: 88 mg/dL (ref 70–99)
Glucose-Capillary: 89 mg/dL (ref 70–99)
Glucose-Capillary: 91 mg/dL (ref 70–99)
Glucose-Capillary: 92 mg/dL (ref 70–99)
Glucose-Capillary: 92 mg/dL (ref 70–99)
Glucose-Capillary: 94 mg/dL (ref 70–99)
Glucose-Capillary: 94 mg/dL (ref 70–99)
Glucose-Capillary: 99 mg/dL (ref 70–99)

## 2024-06-09 NOTE — Progress Notes (Signed)
 PROGRESS NOTE   Garrett [name]  FMW:[id number]    DOB: 1975-12-22    DOA: 10/27/2023  PCP: Pcp, No   I have briefly reviewed patients previous medical records in [facility]   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  He was admitted for ICH with acute respiratory failure with hypoxia.  Initial BP 262/156.  Emergently intubated.  Admitted to neuro ICU.  Hospital course complicated by failed went with recurrent ileus, episodes of infection/colonization of respiratory tract with MRSA, repeated transfers to ICU, tracheostomy and PEG dislodgment.  Has had a prolonged hospital course, now status post tracheostomy and PEG tube, awaiting nursing home placement.  Per ICM note from 10/15, there was an SNF that was willing to take patient but only if he remained on continuous trach/went (without weaning).  PCCM MD discussed with patient's mother who does not want patient to go to facility where he will not be weaned.  Patient has been denied by [facility] and safety inspector.  Therefore patient at this time remains in house with ongoing vent weaning trials.  Please refer to progress note by Dr. Mennie LAMY on 10/14 for extensive details through course of hospitalization until then including significant events, procedures, consultations excetra.   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   Controlled now on metoprolol  tartrate 75 mg bid, amlodipine  10 and PRNs.  Continue management.   Acute on chronic resp failure w hypoxia and hypercapnea  Trach dependence-nocturnal ventilator dependence MRSA, Pseudomonas with trach Decannulation preparatory due to ineffective airway clearance Chronic hypoventilation: S/p tracheostomy redo 7/16.  Not a candidate for decannulation given ineffective airway clearance. Continue trach collar during day with vent support at night. Continue trach care per protocol. Trach replaced last on 06/08/24. Pulmonary following weekly -last chest x-ray 10/13-poor depth of inspiration with decreased right basilar atelectasis 10/15: I discussed with Dr. Fernanda, PCCM MD.  He discussed with patient's mother 10/14.  She does not want patient to go to a SNF that will not do vent weaning (there was a facility that was looking to accept him if he had remained on continuous vent).  He also advised that patient had been [facility] by [facility].  Currently trying vent weaning trials.  As per discussion with nursing, on 10/19, tracheostomy tube was exchanged, tolerated 3.5 hours of trach collar weaning, then had some bloody secretions and tiring and was placed back on the vent.  Placed on trach collar again at around 7:30 AM today.  Continue weaning trials.   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 Mennie LAMY, [facility] MD note from 10/14. Currently on only artificial tears to both eyes. Stable.   Hypernatremia Resolved.Cont TF w/ free water  RD following, On 150 cc q 6 hr. Intermittently monitor labs.  Periodically check labs may be once weekly while here.   Elevated liver  enzymes:  Mild.Unclear etiology.  Resolved.  CK normal.  ALT 56 but almost back to normal.   Inadequate oral intake Estimated Body mass index is 31.17 kg/m.: PEG replaced by IR  on 05/15/24 after becoming dislodged again RD is following, continue tube feeding and supplements and augment.  Anemia of chronic disease Stable.  Body mass index is 30.84 kg/m./Obesity class I. Complicates care.   DVT prophylaxis: enoxaparin  (LOVENOX ) injection 40 mg Start: 03/06/24 1000 SCDs Start: 10/27/23 2226     Code Status: Full Code:  Family Communication: None at bedside Disposition:  Status is: Inpatient Remains inpatient appropriate because: Vent dependent, PEG tube feeding, awaiting safe disposition.     Consultants:   Neurology PCCM General surgery Ophthalmology  Procedures:   As per Dr. Mennie LAMY [facility] MD progress note from 10/14.  Subjective:  See nursing report as above.  Patient nonverbal and noncommunicative at baseline.  Objective:   Vitals:   06/09/24 0600 06/09/24 0700 06/09/24 0713 06/09/24 0724  BP: (!) 151/93 104/72  104/72  Pulse: 82 79  81  Resp: (!) 21 12  19   Temp:   98.9 F (37.2 C)   TempSrc:      SpO2: 98% 98%  96%  Weight:      Height:       Exam remains without changes compared to yesterday.  General exam: Young male, moderately built and obese lying comfortably propped up in bed without distress. Neck: Tracheostomy with trach collar this morning. Respiratory system: Clear to auscultation. Respiratory effort normal.  No increased work of breathing.  Currently on mechanical ventilation via tracheostomy. Cardiovascular system: S1 & S2 heard, RRR. No JVD, murmurs, rubs, gallops or clicks. No pedal edema.  Telemetry personally reviewed: Remains in sinus rhythm and stable. Gastrointestinal system: Abdomen is nondistended, soft and nontender. No organomegaly or masses felt. Normal bowel sounds heard.  PEG tube site intact without acute findings. GU: Has Foley catheter. Central nervous system: Eyes open, felt like he is tracking activity with his eyes but no startle response.  Cloudy left cornea.  Not much conjunctival  congestion. Extremities: Quadriparesis without any movements that were not tested. Skin: No rashes, lesions or ulcers Psychiatry: Judgement and insight impaired. Mood & affect cannot be assessed.     Data Reviewed:   I have personally reviewed following labs and imaging studies   CBC: Recent Labs  Lab 06/06/24 1306  WBC 7.6  HGB 10.1*  HCT 32.8*  MCV 90.4  PLT 372    Basic Metabolic Panel: Recent Labs  Lab 06/06/24 1306  NA 138  K 4.2  CL 100  CO2 28  GLUCOSE 137*  BUN 21*  CREATININE 0.47*  CALCIUM 9.1    Liver Function Tests: Recent Labs  Lab 06/06/24 1306  AST 26  ALT 56*  ALKPHOS 59  BILITOT 0.3  PROT 7.2  ALBUMIN  2.8*    CBG: Recent Labs  Lab 06/06/24 1203 06/06/24 1826 06/06/24 2305  GLUCAP 127* 97 130*    Microbiology Studies:  No results found for this or any previous visit (from the past 240 hours).  Radiology Studies:  No results found.   Scheduled Meds:    amLODipine   10 mg Per Tube Daily   artificial tears   Both Eyes BID   Chlorhexidine  Gluconate Cloth  6 each Topical Daily   enoxaparin  (LOVENOX ) injection  40 mg Subcutaneous Daily   famotidine   20 mg Per Tube BID   feeding supplement (JEVITY 1.5 CAL/FIBER)  237 mL Per Tube 5 X Daily   feeding supplement (PROSource TF20)  60 mL Per Tube TID   free water   150 mL Per Tube Q6H   insulin  aspart  0-9 Units Subcutaneous Q6H   metoprolol  tartrate  75 mg Per Tube BID   multivitamin with minerals  1 tablet Per Tube Daily   mouth rinse  15 mL Mouth Rinse Q2H   polyethylene glycol  17 g Per Tube Daily    Continuous Infusions:     LOS: 226 days     Trenda Mar, MD,  FACP, [facility], [facility], [facility]   [facility] Ad[facility] [place]      To contact the attending provider between 7A-7P or the covering provider during after hours 7P-7A, please log into the web site [email or web address] and access using universal  password for that web site. If you  do not have the password, please call the hospital operator.  06/09/2024, 8:43 AM

## 2024-06-09 NOTE — TOC Progression Note (Signed)
 Transition of Care [facility]) - Progression Note    Patient Details  Name: Garrett [name] MRN: [id number] Date of Birth: 1976/05/03  Transition of Care [facility]) CM/SW Contact  Lendia Dais, [place] Phone Number: 06/09/2024, 12:47 PM  Clinical Narrative:  CSW received a VM from [place] at [facility] in [place], [place]. Arletta stated they do not wean pt's off vents.  CSW attempted to call Randall from [facility] and CSW left a VM.   CSW will continue to follow for placement.     Expected Discharge Plan: Long Term Nursing Home Barriers to Discharge: Continued Medical Work up, SNF Pending Medicaid, SNF Pending bed offer, SNF Pending payor source - LOG, Inadequate or no insurance (New trach)               Expected Discharge Plan and Services In-house Referral: Clinical Social Work, Hospice / Palliative Care   Post Acute Care Choice: Skilled Nursing Facility Living arrangements for the past 2 months: Single Family Home                                       Social Drivers of Health (SDOH) Interventions SDOH Screenings   Food Insecurity: Patient Unable To Answer (10/29/2023)  Social Connections: Unknown (06/23/2023)   Received from [facility]  Tobacco Use: High Risk (10/31/2023)    Readmission Risk Interventions     No data to display

## 2024-06-09 NOTE — Evaluation (Signed)
 RT Evaluate and Treat Note  06/09/2024   Breathing is (select one): Same as normal    The following was found on auscultation (select multiple):  Bilateral Breath Sounds: Clear (06/09/24 0724)  R Upper  Breath Sounds: Clear (06/09/24 0724) L Upper Breath Sounds: Clear (06/09/24 0724) R Lower Breath Sounds: Clear (06/09/24 0724) L Lower Breath Sounds: Clear (06/09/24 0724)    Cough Assessment: Cough: Productive;Strong (06/09/24 0724)    Most Recent Chest Xray:... (No results found.    The following medications and/or interventions were ordered/changed/discontinued as part of the Respiratory Treatment protocol:   Medication Changes: no changes   Airway Clearance Changes: no changes   Oxygen Therapy Changes: ATC ordered

## 2024-06-09 NOTE — Progress Notes (Signed)
 NAME:  Garrett [name], MRN:  [id number], DOB:  05-17-1976, LOS: 226 ADMISSION DATE:  10/27/2023, CONSULTATION DATE:  10/27/2023 REFERRING MD: Regalado - TRH, CHIEF COMPLAINT:  Found down   History of Present Illness:  48 y/o man who was found down at home. PMHx significant for HTN.  Patient was down for an unknown amount of time, found by his fiance when she came home from work, reportedly with agonal breathing. Patient had driven her to work the morning of admission.  He has HTN but never checks his BP and does not follow with MD. Patient's fiance states that she can tell when his BP is really high; his eyes get blood shot and he is sweating. No h/o seizures. Besides almost daily marijuana and cigarette smoking, she is not aware of any other drug use. Drug screen positive for opioids and he was given several doses of Narcan . 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 BP in ED 262/156. CXR revealed RUL and perihilar infiltrates suggesting aspiration pneumonia. ED labs also revealed PH 7.169, LA 4.4, CPK 985. Patient was intubated in ED.  PCCM consulted pulmonary evaluation s/p tracheostomy.  Pertinent Medical History:  Hypertension  Significant Hospital Events: Including procedures, antibiotic start and stop dates in addition to other pertinent events   3/08 - Intubated in ED, pontine bleed/SAH. CT head 17:09 Acute large hemorrhage 4.1 cm in pons with intraventricular extension without hydrocephalus 3/09 - CTA 03:14 AM No change in IPH in pons, similar biparietal [facility] 3/14 - Now awake, appears locked in can follow commands with eyes. 3/16 - Purposeful with left upper extremity  3/22 - Tracheostomy placed at bedside, bleeding issues overnight from trach site 3/24 - PEG (Dr.  Sebastian) 3/24 - Sputum culture with MRSA 3/27 - Vomited. TF on hold. Abd film c/w ileus. Added reglan , got SSE. Did tolerate PSV all day  3/28 - BMs x2 after SSE day prior. Added back TFs at 1/2 rated. Tolerated PS 3/29 - Tolerated PS  3/31 - Tolerating CPAP PS 15/5, TF on hold due to ileus. 4/01 - Con't to hold tube feeds today, restarting vancomycin  and sending tracheal aspirate as peaks are 37, plat 24, driving 19. Thick secretions. Fever overnight.  4/02 - Peaks improved. Ongoing hiccups. Trickle feeding. Neuro exam unchanged.  4/20 - Trach was changed to cuffless #8 4/28 - Not safe to swallow. Limited communication and he follows simple commands. On TF 5/01 - On TC 28%, with large secretions. Working with speech and he is improving to initiate PMV trials under ST supervision   5/05 - On TC 30%, 7 L/min. Trach #6 cuffless, changed 5/1 to facilitate PMV trials  5/12 - On TC 21%, 6 L/min. Trach #6 cuffless, unable to produce sounds or speak on PMV. No significant resp secretions. On PEG TF 6/10 - Tracheostomy was removed due to failure to well provide adequate airway 02/15/2024 pulmonary critical care consult. 7/07 Patient obtunded and desaturating on NRB; transfer to icu for intubation; updated mother over phone who wants to reverse code status to full code; ent consulted 7/09 Remains intubated on no continuous sedation, right eye open but does not track movement  7/16 Revision ENT trach with [facility] Flap 7/18 Weaned on vent 5 hours  7/19 Tolerated trach collar for the majority of the day but had some  increased work of breath resulting in being placed back on vent support  7/20 No issues overnight, back on ATC this AM 7/21 Did not tolerate ATC. Placed back on PRVC. 7/24 ATC until afternoon and went on vent for WOB reportedly  7/25 ATC for a few hours  7/26 ATC x 6h 7/28 tolerating trach collar-tolerated for most of the day 7/30 on vent overnight and back on trach collar today 8/1 back on  vent overnight  8/2 was able to maintain off of ventilator overnight but by 6 AM today patient was seen with signs of increased work of breathing including tachycardia therefore patient was placed back on ventilator 8/3 remained on ventilator overnight to transition to trach collar this a.m. 8/4 did 18 hour ATC yesterday; back on vent 3am today 8/5 TC for 12-14 hours. Then went back to vent because of tachypnea.  8/6 tolerated ATC at 30% for 24 hours. Back on vent this AM for rest as was a bit tachypneic 8/11 been off vent >24 hours. 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  8/12-off vent for over 48 hours 8/18 completed abx  8/19 Changed to cuffless trach 8/20 Increasing tachypnea, hypercarbia.  Trach changed back to #6 cuffed and brought back to ICU to be placed on ventilator.  Antibiotics broadened to linezolid  and cefepime  8/23 PSV wean 8/24 eeg for twitching no sz 8/26 started TC 8/27 did TC overnight, hypercarbic on repeat gas but compensated 9/2 periods of apnea but overall doing ok ATC day vent night 9/8 continuing noct vent Atc day  9/22 Tolerating ATC during day and nocturnal ventilation  Interim History / Subjective:  No overnight events.  Was on vent support overnight.  Currently on trach collar.  Objective:   Blood pressure 118/86, pulse 75, temperature 98.9 F (37.2 C), resp. rate (!) 30, height 5' 8 (1.727 m), weight 92 kg, SpO2 94%.    Vent Mode: PRVC FiO2 (%):  [40 %] 40 % Set Rate:  [12 bmp] 12 bmp Vt Set:  [550 mL] 550 mL PEEP:  [5 cmH20] 5 cmH20 Plateau Pressure:  [16 cmH20-18 cmH20] 17 cmH20   Intake/Output Summary (Last 24 hours) at 06/09/2024 1058 Last data filed at 06/09/2024 0855 Gross per 24 hour  Intake 2264 ml  Output 1185 ml  Net 1079 ml   Filed Weights   06/07/24 0423 06/08/24 0500 06/09/24 0400  Weight: 92 kg 93.7 kg 92 kg   Physical Exam: General: chronically ill appearing on mech  vent HEENT: MM pink/moist; trach in place Neuro: tracks w/ eyes CV: s1s2, RRR, no m/r/g PULM:  dim rhonchi BS bilaterally; trach currently on trach collar GI: soft, bsx4 active  Extremities: warm/dry, no edema  Skin: no rashes or lesions   Lab data reviewed  Resolved Problem List:  Post sedation hypotension Left eye conjunctivitis - resolved  Hyperglycemia, resolved Pseudomonas and E. 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 Not a candidate for decannulation given ineffective airway clearance -course since then has been c/b recurrent HCAP, variable vent need-- at times having decompensations req 24hr vent.  -  most recently he has had noct hypercarbia and its felt that he needs noct vent. He continues to demonstrate hypercarbia after prolonged ATC trials  Plan: -No plan for decannulation with mental status along with increased secretions. - Continue trach collar during the day and vent at night.  At this point with previous hypercapnic respiratory failure while being off nocturnal ventilation, strongly recommend continuing nocturnal ventilation without a trial for weaning nocturnal ventilation. - Trach care per RT. -CXR weekly; more frequent as clinically indicated Nebulization treatments as needed VAP bundle in place  Will cont to follow on a weekly basis      Total care time: 25 minutes   care time was exclusive of separately billable procedures and treating other patients.   care was  necessary to treat or prevent imminent or life-threatening deterioration.    care was time spent personally by me on the following activities: development of treatment plan with patient and/or surrogate as well as nursing, discussions with consultants, evaluation of patient's response to treatment, examination of patient, obtaining history from patient or surrogate, ordering and performing treatments and interventions, ordering and review of laboratory studies, ordering and review of radiographic studies, pulse oximetry, re-evaluation of patient's condition and participation in multidisciplinary rounds.  Sammi JONETTA Fredericks, MD Pulmonary, Critical Care and Sleep Attending.  Pager: [phone]  06/09/2024, 11:01 AM

## 2024-06-09 NOTE — Progress Notes (Signed)
RT NOTE: patient placed on 40% ATC.  Currently tolerating well.  Will continue to monitor.

## 2024-06-09 NOTE — Progress Notes (Signed)
 RT NOTE: patient placed back on ventilator, on full support settings, due to oxygen sats sustaining in the low 80s.  Tolerating well at this time.  Sats currently 94%.  Will continue to monitor.

## 2024-06-10 DIAGNOSIS — I613 Nontraumatic intracerebral hemorrhage in brain stem: Secondary | ICD-10-CM | POA: Diagnosis not present

## 2024-06-10 NOTE — Evaluation (Signed)
 RT Evaluate and Treat Note  06/10/2024   Breathing is (select one): Same as normal    The following was found on auscultation (select multiple):  Bilateral Breath Sounds: Rhonchi (06/10/24 0742)  R Upper  Breath Sounds: Rhonchi (06/10/24 0742) L Upper Breath Sounds: Rhonchi (06/10/24 0742) R Lower Breath Sounds: Rhonchi (06/10/24 0742) L Lower Breath Sounds: Rhonchi (06/10/24 9257)    Cough Assessment: Cough: Productive;Strong (06/10/24 0742)    Most Recent Chest Xray:... (No results found.    The following medications and/or interventions were ordered/changed/discontinued as part of the Respiratory Treatment protocol:   Medication Changes: no changes   Airway Clearance Changes: no changes   Oxygen Therapy Changes: no changes

## 2024-06-10 NOTE — Progress Notes (Signed)
RT NOTE: patient placed on 40% ATC.  Tolerating well at this time.  Will continue to monitor.

## 2024-06-10 NOTE — Progress Notes (Signed)
 PROGRESS NOTE   Garrett [name]  FMW:[id number]    DOB: Dec 26, 1975    DOA: 10/27/2023  PCP: Pcp, No   I have briefly reviewed patients previous medical records in [facility].   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  He was admitted for ICH with acute respiratory failure with hypoxia.  Initial BP 262/156.  Emergently intubated.  Admitted to neuro ICU.  Hospital course complicated by failed went with recurrent ileus, episodes of infection/colonization of respiratory tract with MRSA, repeated transfers to ICU, tracheostomy and PEG dislodgment.  Has had a prolonged hospital course, now status post tracheostomy and PEG tube, awaiting nursing home placement.  Per ICM note from 10/15, there was an SNF that was willing to take patient but only if he remained on continuous trach/went (without weaning).  PCCM MD discussed with patient's mother who does not want patient to go to a facility where he will not be weaned.  Patient has been denied by [facility] and safety inspector.  Therefore patient at this time remains in house with ongoing vent weaning trials.  Please refer to progress note by Dr. Mennie LAMY on 10/14 for extensive details through course of hospitalization until then including significant events, procedures, consultations excetra.   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   Controlled now on metoprolol  tartrate 75 mg bid, amlodipine  10 and PRNs.  Continue management.   Acute on chronic resp failure w hypoxia and hypercapnea  Trach dependence-nocturnal ventilator dependence MRSA, Pseudomonas with trach Decannulation preparatory due to ineffective airway clearance Chronic hypoventilation: S/p tracheostomy redo 7/16.  Not a candidate for decannulation given ineffective airway clearance. Continue trach collar during day with vent support at night. Continue trach care per protocol. Trach replaced last on 06/08/24. Pulmonary following weekly -last chest x-ray 10/13-poor depth of inspiration with decreased right basilar atelectasis 10/15: I discussed with Dr. Fernanda, PCCM MD.  He discussed with patient's mother 10/14.  She does not want patient to go to a SNF that will not do vent weaning (there was a facility that was looking to accept him if he had remained on continuous vent).  He also advised that patient had been denied by [facility].  Currently trying vent weaning trials.  10/19, tracheostomy tube was exchanged, ongoing daytime tracheostomy collar weaning attempts in the daytime, tolerates about 3 to 3.5 hours and back on vent including vent at that time.   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 Mennie LAMY, [facility] MD note from 10/14. Currently on only artificial tears to both eyes. Stable.   Hypernatremia Resolved.Cont TF w/ free water  RD following, On 150 cc q 6 hr. Intermittently monitor labs.  Periodically check labs may be once weekly while here.   Elevated liver enzymes:  Mild.Unclear etiology.  Resolved.  CK normal.  ALT 56 but almost back to normal.  Inadequate oral intake Estimated Body mass index is 31.17 kg/m.: PEG replaced by IR on 05/15/24 after becoming dislodged again RD is following, continue tube feeding and  supplements and augment.  Anemia of chronic disease Stable.  Body mass index is 31.84 kg/m./Obesity class I. Complicates care.   DVT prophylaxis: enoxaparin  (LOVENOX ) injection 40 mg Start: 03/06/24 1000 SCDs Start: 10/27/23 2226     Code Status: Full Code:  Family Communication: None at bedside Disposition:  Status is: Inpatient Remains inpatient appropriate because: Vent dependent, PEG tube feeding, awaiting safe disposition.     Consultants:   Neurology PCCM General surgery Ophthalmology  Procedures:   As per Dr. Mennie LAMY [facility] MD progress note from 10/14.  Subjective:  Patient nonverbal and noncommunicative at baseline.  Discussed with RN at bedside.  No new or acute issues.  Objective:   Vitals:   06/10/24 0742 06/10/24 0746 06/10/24 0800 06/10/24 0900  BP: 109/76  109/76 107/75  Pulse: 72  72 73  Resp: 17  (!) 26 (!) 26  Temp:  99.1 F (37.3 C)    TempSrc:  Axillary    SpO2:   97% 97%  Weight:      Height:       Exam remains without changes compared to yesterday.  General exam: Young male, moderately built and obese lying comfortably propped up in bed without distress. Neck: Tracheostomy with trach collar this morning. Respiratory system: Clear to auscultation. Respiratory effort normal.  No increased work of breathing.  Currently on mechanical ventilation via tracheostomy. Cardiovascular system: S1 & S2 heard, RRR. No JVD, murmurs, rubs, gallops or clicks. No pedal edema.  Telemetry personally reviewed: Sinus rhythm. Gastrointestinal system: Abdomen is nondistended, soft and nontender. No organomegaly or masses felt. Normal bowel sounds heard.  PEG tube site intact without acute findings. GU: Has Foley catheter. Central nervous system: Eyes open, felt like he is tracking activity with his eyes but no startle response.  Cloudy left cornea.  Not much conjunctival congestion. Extremities: Quadriparesis without any movements that were not tested. Skin: No  rashes, lesions or ulcers Psychiatry: Judgement and insight impaired. Mood & affect cannot be assessed.     Data Reviewed:   I have personally reviewed following labs and imaging studies   CBC: Recent Labs  Lab 06/06/24 1306  WBC 7.6  HGB 10.1*  HCT 32.8*  MCV 90.4  PLT 372    Basic Metabolic Panel: Recent Labs  Lab 06/06/24 1306  NA 138  K 4.2  CL 100  CO2 28  GLUCOSE 137*  BUN 21*  CREATININE 0.47*  CALCIUM 9.1    Liver Function Tests: Recent Labs  Lab 06/06/24 1306  AST 26  ALT 56*  ALKPHOS 59  BILITOT 0.3  PROT 7.2  ALBUMIN  2.8*    CBG: Recent Labs  Lab 06/09/24 1119 06/09/24 1752 06/09/24 2337  GLUCAP 99 91 146*    Microbiology Studies:  No results found for this or any previous visit (from the past 240 hours).  Radiology Studies:  No results found.   Scheduled Meds:    amLODipine   10 mg Per Tube Daily   artificial tears   Both Eyes BID   Chlorhexidine  Gluconate Cloth  6 each Topical Daily   enoxaparin  (LOVENOX ) injection  40 mg Subcutaneous Daily   famotidine   20 mg Per Tube BID   feeding supplement (JEVITY 1.5 CAL/FIBER)  237 mL Per Tube 5 X Daily   feeding supplement (PROSource TF20)  60 mL  Per Tube TID   free water   150 mL Per Tube Q6H   insulin  aspart  0-9 Units Subcutaneous Q6H   metoprolol  tartrate  75 mg Per Tube BID   multivitamin with minerals  1 tablet Per Tube Daily   mouth rinse  15 mL Mouth Rinse Q2H   polyethylene glycol  17 g Per Tube Daily    Continuous Infusions:     LOS: 227 days     Trenda Mar, MD,  FACP, [facility], [facility], [facility]   [facility] Advisor [place]      To contact the attending provider between 7A-7P or the covering provider during after hours 7P-7A, please log into the web site [email or web address] and access using universal [place] password for that web site. If you do not have the password, please call the hospital operator.  06/10/2024, 9:33 AM

## 2024-06-10 NOTE — Plan of Care (Signed)
  Problem: Skin Integrity: Goal: Risk for impaired skin integrity will decrease Outcome: Progressing   Problem: Tissue Perfusion: Goal: Adequacy of tissue perfusion will improve Outcome: Progressing   Problem: Clinical Measurements: Goal: Will remain free from infection Outcome: Progressing Goal: Cardiovascular complication will be avoided Outcome: Progressing   Problem: Activity: Goal: Risk for activity intolerance will decrease Outcome: Progressing   Problem: Nutrition: Goal: Adequate nutrition will be maintained Outcome: Progressing

## 2024-06-11 DIAGNOSIS — I613 Nontraumatic intracerebral hemorrhage in brain stem: Secondary | ICD-10-CM | POA: Diagnosis not present

## 2024-06-11 LAB — GLUCOSE, CAPILLARY
Glucose-Capillary: 109 mg/dL — ABNORMAL HIGH (ref 70–99)
Glucose-Capillary: 111 mg/dL — ABNORMAL HIGH (ref 70–99)
Glucose-Capillary: 170 mg/dL — ABNORMAL HIGH (ref 70–99)
Glucose-Capillary: 84 mg/dL (ref 70–99)
Glucose-Capillary: 144 mg/dL — ABNORMAL HIGH (ref 70–99)
Glucose-Capillary: 119 mg/dL — ABNORMAL HIGH (ref 70–99)

## 2024-06-11 NOTE — Progress Notes (Signed)
 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  He was admitted for ICH with acute respiratory failure with hypoxia.  Initial BP 262/156.  Emergently intubated.  Admitted to neuro ICU.  Hospital course complicated by failed went with recurrent ileus, episodes of infection/colonization of respiratory tract with MRSA, repeated transfers to ICU, tracheostomy and PEG dislodgment.   Has had a prolonged hospital course, now status post tracheostomy and PEG tube, awaiting nursing home placement.   Per ICM note from 10/15, there was an SNF that was willing to take patient but only if he remained on continuous trach/went (without weaning).  PCCM MD discussed with patient's mother who does not want patient to go to a facility where he will not be weaned.  Patient has been denied by [facility] and safety inspector.  Therefore patient at this time remains in house with ongoing vent weaning trials.   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   Controlled now on metoprolol  tartrate 75 mg bid, amlodipine  10 and PRNs.  Continue management.   Acute on chronic resp failure w hypoxia and hypercapnea  Trach dependence-nocturnal ventilator dependence MRSA, Pseudomonas with trach Decannulation preparatory due to ineffective airway clearance Chronic hypoventilation: S/p tracheostomy redo 7/16.  Not a candidate for decannulation given ineffective airway clearance. Continue trach collar during day with vent support at night. Continue trach care per protocol. Trach replaced last on 06/08/24. Pulmonary following weekly -last chest x-ray 10/13-poor depth of inspiration with decreased right basilar atelectasis 10/15: I discussed with Dr. Fernanda, PCCM MD.  He discussed with patient's mother 10/14.  She does not want patient to go to a SNF that will not do vent weaning (there was a facility that was looking to accept him if he had remained on continuous vent).  He also advised that patient had been denied by [facility].  Currently trying vent weaning trials.  10/19, tracheostomy tube was exchanged, ongoing daytime tracheostomy collar weaning attempts in the daytime, tolerates about 3 to 3.5 hours and back on vent including vent at that time.   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 Mennie LAMY, [facility] MD note from 10/14. Currently on only artificial tears to both eyes. Stable.   Hypernatremia Resolved.Cont TF w/ free water  RD following, On 150 cc q 6 hr. Intermittently monitor labs.  Periodically check labs may  be once weekly while here.   Elevated liver enzymes:  Mild.Unclear etiology.  Resolved.  CK normal.     Inadequate oral intake Estimated Body mass index  is 31.17 kg/m.: PEG replaced by IR on 05/15/24 after becoming dislodged again RD is following, continue tube feeding and supplements and augment.   Anemia of chronic disease Stable.   Body mass index is 30.81 kg/m./Obesity class I. Complicates care.    DVT prophylaxis: enoxaparin  (LOVENOX ) injection 40 mg Start: 03/06/24 1000 SCDs Start: 10/27/23 2226     Code Status: Full Code Family Communication:   Status is: Inpatient Remains inpatient appropriate because: Trach and Vent dependent    Subjective:  No acute issues overnight.  Patient is on the vent at night and trach collar during the day.  He is unable to communicate because of brain hemorrhage and is in a persistent vegetative state.  No family is present at the bedside.  Examination:  General exam: Vegetative state, unable to communicate, nonverbal Neck: Tracheostomy in place connected to trach collar Respiratory system: Clear to auscultation. Respiratory effort normal. Cardiovascular system: S1 & S2 heard, RRR. No JVD, murmurs, rubs, gallops or clicks. Gastrointestinal system: PEG tube in place Central nervous system: Appears back but noncommunicative/nonverbal Extremities: Unable to follow any commands Skin: No rashes, lesions or ulcers Genitourinary: Foley's catheter in place     Diet Orders (From admission, onward)     Start     Ordered   04/09/24 0737  Diet NPO time specified  Diet effective now        04/09/24 0737            Objective: Vitals:   06/11/24 0746 06/11/24 0800 06/11/24 0815 06/11/24 0830  BP: 99/75 101/71    Pulse: 66 69 68 68  Resp: 15 20 (!) 26 (!) 26  Temp:      TempSrc:      SpO2: 99% 98% 97% 97%  Weight:      Height:        Intake/Output Summary (Last 24 hours) at 06/11/2024 1004 Last data filed at 06/11/2024 0800 Gross per 24 hour  Intake 837 ml  Output 1050 ml  Net -213 ml   Filed Weights   06/09/24 0400 06/10/24 0500 06/11/24 0500  Weight: 92 kg 95 kg 91.9  kg    Scheduled Meds:  amLODipine   10 mg Per Tube Daily   artificial tears   Both Eyes BID   Chlorhexidine  Gluconate Cloth  6 each Topical Daily   enoxaparin  (LOVENOX ) injection  40 mg Subcutaneous Daily   famotidine   20 mg Per Tube BID   feeding supplement (JEVITY 1.5 CAL/FIBER)  237 mL Per Tube 5 X Daily   feeding supplement (PROSource TF20)  60 mL Per Tube TID   free water   150 mL Per Tube Q6H   insulin  aspart  0-9 Units Subcutaneous Q6H   metoprolol  tartrate  75 mg Per Tube BID   multivitamin with minerals  1 tablet Per Tube Daily   mouth rinse  15 mL Mouth Rinse Q2H   polyethylene glycol  17 g Per Tube Daily   Continuous Infusions:  Nutritional status Signs/Symptoms: NPO status Interventions: Tube feeding, Prostat, MVI, Juven Body mass index is 30.81 kg/m.  Data Reviewed:   CBC: Recent Labs  Lab 06/06/24 1306  WBC 7.6  HGB 10.1*  HCT 32.8*  MCV 90.4  PLT 372   Basic Metabolic Panel: Recent Labs  Lab 06/06/24 1306  NA 138  K 4.2  CL 100  CO2 28  GLUCOSE 137*  BUN 21*  CREATININE 0.47*  CALCIUM 9.1   GFR: Estimated Creatinine Clearance: 125.6 mL/min (A) (by C-G formula based on SCr of 0.47 mg/dL (L)). Liver Function Tests: Recent Labs  Lab 06/06/24 1306  AST 26  ALT 56*  ALKPHOS 59  BILITOT 0.3  PROT 7.2  ALBUMIN  2.8*   No results for input(s): LIPASE, AMYLASE in the last 168 hours. No results for input(s): AMMONIA in the last 168 hours. Coagulation Profile: No results for input(s): INR, PROTIME in the last 168 hours. Cardiac Enzymes: Recent Labs  Lab 06/06/24 1306  CKTOTAL 97   BNP (last 3 results) No results for input(s): PROBNP in the last 8760 hours. HbA1C: No results for input(s): HGBA1C in the last 72 hours. CBG: Recent Labs  Lab 06/08/24 2332 06/09/24 0526 06/09/24 1119 06/09/24 1752 06/09/24 2337  GLUCAP 143* 89 99 91 146*   Lipid Profile: No results for input(s): CHOL, HDL, LDLCALC, TRIG,  CHOLHDL, LDLDIRECT in the last 72 hours. Thyroid Function Tests: No results for input(s): TSH, T4TOTAL, FREET4, T3FREE, THYROIDAB in the last 72 hours. Anemia Panel: No results for input(s): VITAMINB12, FOLATE, FERRITIN, TIBC, IRON, RETICCTPCT in the last 72 hours. Sepsis Labs: No results for input(s): PROCALCITON, LATICACIDVEN in the last 168 hours.  No results found for this or any previous visit (from the past 240 hours).       Radiology Studies: No results found.         LOS: 228 days   Time spent= 37 mins    Deliliah Room, MD [facility]  If 7PM-7AM, please contact night-coverage  06/11/2024, 10:04 AM

## 2024-06-11 NOTE — Plan of Care (Signed)
  Problem: Nutrition: Goal: Adequate nutrition will be maintained Outcome: Progressing   Problem: Elimination: Goal: Will not experience complications related to bowel motility Outcome: Progressing   Problem: Pain Managment: Goal: General experience of comfort will improve and/or be controlled Outcome: Progressing   Problem: Safety: Goal: Ability to remain free from injury will improve Outcome: Progressing   Problem: Skin Integrity: Goal: Risk for impaired skin integrity will decrease Outcome: Progressing

## 2024-06-11 NOTE — Evaluation (Signed)
 RT Evaluate and Treat Note  06/11/2024   Breathing is (select one): Same as normal    The following was found on auscultation (select multiple):  Bilateral Breath Sounds: Diminished (06/11/24 0746)  R Upper  Breath Sounds: Rhonchi;Diminished (06/10/24 2027) L Upper Breath Sounds: Rhonchi;Diminished (06/10/24 2027) R Lower Breath Sounds: Diminished (06/10/24 2027) L Lower Breath Sounds: Diminished (06/10/24 2027)    Cough Assessment: Cough: Strong;Productive (06/11/24 0746)    Most Recent Chest Xray:... (No results found.    The following medications and/or interventions were ordered/changed/discontinued as part of the Respiratory Treatment protocol:   Medication Changes: No changes   Airway Clearance Changes: No changes   Oxygen Therapy Changes: No changes

## 2024-06-12 DIAGNOSIS — I613 Nontraumatic intracerebral hemorrhage in brain stem: Secondary | ICD-10-CM | POA: Diagnosis not present

## 2024-06-12 LAB — GLUCOSE, CAPILLARY
Glucose-Capillary: 149 mg/dL — ABNORMAL HIGH (ref 70–99)
Glucose-Capillary: 158 mg/dL — ABNORMAL HIGH (ref 70–99)
Glucose-Capillary: 86 mg/dL (ref 70–99)
Glucose-Capillary: 92 mg/dL (ref 70–99)
Glucose-Capillary: 93 mg/dL (ref 70–99)
Glucose-Capillary: 96 mg/dL (ref 70–99)

## 2024-06-12 NOTE — Progress Notes (Signed)
 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  He was admitted for ICH with acute respiratory failure with hypoxia.  Initial BP 262/156.  Emergently intubated.  Admitted to neuro ICU.  Hospital course complicated by failed went with recurrent ileus, episodes of infection/colonization of respiratory tract with MRSA, repeated transfers to ICU, tracheostomy and PEG dislodgment.   Has had a prolonged hospital course, now status post tracheostomy and PEG tube, awaiting nursing home placement.   Per ICM note from 10/15, there was an SNF that was willing to take patient but only if he remained on continuous trach/went (without weaning).  PCCM MD discussed with patient's mother who does not want patient to go to a facility where he will not be weaned.  Patient has been denied by [facility] and safety inspector.  Therefore patient at this time remains in house with ongoing vent weaning trials.   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   Controlled now on metoprolol  tartrate 75 mg bid, amlodipine  10 and PRNs.  Continue management.   Acute on chronic resp failure w hypoxia and hypercapnea  Trach dependence-nocturnal ventilator dependence MRSA, Pseudomonas with trach Decannulation preparatory due to ineffective airway clearance Chronic hypoventilation: S/p tracheostomy redo 7/16.  Not a candidate for decannulation given ineffective airway clearance. Continue trach collar during day with vent support at night. Continue trach care per protocol. Trach replaced last on 06/08/24. Pulmonary following weekly -last chest x-ray 10/13-poor depth of inspiration with decreased right basilar atelectasis 10/15: I discussed with Dr. Fernanda, PCCM MD.  He discussed with patient's mother 10/14.  She does not want patient to go to a SNF that will not do vent weaning (there was a facility that was looking to accept him if he had remained on continuous vent).  He also advised that patient had been denied by [facility].  Currently trying vent weaning trials.  10/19, tracheostomy tube was exchanged, ongoing daytime tracheostomy collar weaning attempts in the daytime, tolerates about 3 to 3.5 hours and back on vent including vent at that time.   Exposure keratopathy of left eye: Currently on only artificial tears to both eyes. Stable.   Hypernatremia Resolved.Cont TF w/ free water  RD following, On 150 cc q 6 hr. Intermittently monitor labs.  Periodically check labs may be once weekly while here.   Elevated liver enzymes:  Mild.Unclear etiology.  Resolved.  CK normal.     Inadequate oral intake Estimated Body mass index is 31.17 kg/m.: PEG replaced by IR on 05/15/24 after becoming  dislodged again RD is following, continue tube feeding and supplements and augment.   Anemia of chronic disease Stable.   Body mass index is 30.81 kg/m./Obesity class I. Complicates care.    DVT prophylaxis: enoxaparin  (LOVENOX ) injection 40 mg Start: 03/06/24 1000 SCDs Start: 10/27/23 2226     Code Status: Full Code Family Communication:   Status is: Inpatient Remains inpatient appropriate because: Trach and Vent dependent    Subjective:  No acute issues overnight.  Unable to communicate. Trach collar in place. Hemodynamically stable.  Examination:  General exam: Vegetative state, unable to communicate, nonverbal Neck: Tracheostomy in place connected to trach collar Respiratory system: Clear to auscultation. Respiratory effort normal. Cardiovascular system: S1 & S2 heard, RRR. No JVD, murmurs, rubs, gallops or clicks. Gastrointestinal system: PEG tube in place Central nervous system: Appears back but noncommunicative/nonverbal Extremities: Unable to follow any commands Skin: No rashes, lesions or ulcers Genitourinary: Foley's catheter in place     Diet Orders (From admission, onward)     Start     Ordered   04/09/24 0737  Diet NPO time specified  Diet effective now        04/09/24 0737            Objective: Vitals:   06/12/24 0930 06/12/24 0938 06/12/24 0945 06/12/24 1000  BP:  116/77  122/79  Pulse: 74 73 78 74  Resp: 20  (!) 24 (!) 34  Temp:      TempSrc:      SpO2: 95%  94% 94%  Weight:      Height:        Intake/Output Summary (Last 24 hours) at 06/12/2024 1017 Last data filed at 06/12/2024 0945 Gross per 24 hour  Intake 1767 ml  Output 1355 ml  Net 412 ml   Filed Weights   06/10/24 0500 06/11/24 0500 06/12/24 0500  Weight: 95 kg 91.9 kg 91.7 kg    Scheduled Meds:  amLODipine   10 mg Per Tube Daily   artificial tears   Both Eyes BID   Chlorhexidine  Gluconate Cloth  6 each Topical Daily   enoxaparin  (LOVENOX ) injection  40 mg  Subcutaneous Daily   famotidine   20 mg Per Tube BID   feeding supplement (JEVITY 1.5 CAL/FIBER)  237 mL Per Tube 5 X Daily   feeding supplement (PROSource TF20)  60 mL Per Tube TID   free water   150 mL Per Tube Q6H   insulin  aspart  0-9 Units Subcutaneous Q6H   metoprolol  tartrate  75 mg Per Tube BID   multivitamin with minerals  1 tablet Per Tube Daily   mouth rinse  15 mL Mouth Rinse Q2H   polyethylene glycol  17 g Per Tube Daily   Continuous Infusions:  Nutritional status Signs/Symptoms: NPO status Interventions: Tube feeding, Prostat, MVI, Juven Body mass index is 30.74 kg/m.  Data Reviewed:   CBC: Recent Labs  Lab 06/06/24 1306  WBC 7.6  HGB 10.1*  HCT 32.8*  MCV 90.4  PLT 372   Basic Metabolic Panel: Recent Labs  Lab 06/06/24 1306  NA 138  K 4.2  CL 100  CO2 28  GLUCOSE 137*  BUN 21*  CREATININE 0.47*  CALCIUM 9.1   GFR: Estimated Creatinine Clearance: 125.5 mL/min (A) (by C-G formula based on SCr of 0.47 mg/dL (L)). Liver Function Tests:  Recent Labs  Lab 06/06/24 1306  AST 26  ALT 56*  ALKPHOS 59  BILITOT 0.3  PROT 7.2  ALBUMIN  2.8*   No results for input(s): LIPASE, AMYLASE in the last 168 hours. No results for input(s): AMMONIA in the last 168 hours. Coagulation Profile: No results for input(s): INR, PROTIME in the last 168 hours. Cardiac Enzymes: Recent Labs  Lab 06/06/24 1306  CKTOTAL 97   BNP (last 3 results) No results for input(s): PROBNP in the last 8760 hours. HbA1C: No results for input(s): HGBA1C in the last 72 hours. CBG: Recent Labs  Lab 06/10/24 2324 06/11/24 0527 06/11/24 1114 06/11/24 1742 06/12/24 0006  GLUCAP 170* 84 144* 119* 96   Lipid Profile: No results for input(s): CHOL, HDL, LDLCALC, TRIG, CHOLHDL, LDLDIRECT in the last 72 hours. Thyroid Function Tests: No results for input(s): TSH, T4TOTAL, FREET4, T3FREE, THYROIDAB in the last 72 hours. Anemia Panel: No results  for input(s): VITAMINB12, FOLATE, FERRITIN, TIBC, IRON, RETICCTPCT in the last 72 hours. Sepsis Labs: No results for input(s): PROCALCITON, LATICACIDVEN in the last 168 hours.  No results found for this or any previous visit (from the past 240 hours).       Radiology Studies: No results found.       LOS: 229 days   Time spent= 37 mins    Deliliah Room, MD [facility]  If 7PM-7AM, please contact night-coverage  06/12/2024, 10:17 AM

## 2024-06-12 NOTE — Plan of Care (Signed)
  Problem: Fluid Volume: Goal: Ability to maintain a balanced intake and output will improve Outcome: Progressing   Problem: Skin Integrity: Goal: Risk for impaired skin integrity will decrease Outcome: Progressing   Problem: Clinical Measurements: Goal: Ability to maintain clinical measurements within normal limits will improve Outcome: Progressing Goal: Will remain free from infection Outcome: Progressing Goal: Diagnostic test results will improve Outcome: Progressing Goal: Respiratory complications will improve Outcome: Progressing Goal: Cardiovascular complication will be avoided Outcome: Progressing   Problem: Nutrition: Goal: Dietary intake will improve Outcome: Progressing   Problem: Self-Care: Goal: Ability to participate in self-care as condition permits will improve Outcome: Not Progressing Goal: Verbalization of feelings and concerns over difficulty with self-care will improve Outcome: Not Progressing Goal: Ability to communicate needs accurately will improve Outcome: Not Progressing

## 2024-06-12 NOTE — Evaluation (Signed)
 RT Evaluate and Treat Note  06/12/2024   Breathing is (select one): Same as normal    The following was found on auscultation (select multiple):  Bilateral Breath Sounds: Diminished (06/12/24 1200)  R Upper  Breath Sounds: Diminished (06/12/24 1200) L Upper Breath Sounds: Diminished (06/12/24 1200) R Lower Breath Sounds: Diminished (06/12/24 1200) L Lower Breath Sounds: Diminished (06/12/24 1200)    Cough Assessment: Cough: Strong;Productive (06/12/24 1104)    Most Recent Chest Xray:... (No results found.    The following medications and/or interventions were ordered/changed/discontinued as part of the Respiratory Treatment protocol:   Medication Changes: No changes   Airway Clearance Changes: No changes   Oxygen Therapy Changes: No changes

## 2024-06-13 DIAGNOSIS — A419 Sepsis, unspecified organism: Secondary | ICD-10-CM

## 2024-06-13 DIAGNOSIS — I613 Nontraumatic intracerebral hemorrhage in brain stem: Secondary | ICD-10-CM | POA: Diagnosis not present

## 2024-06-13 DIAGNOSIS — I619 Nontraumatic intracerebral hemorrhage, unspecified: Secondary | ICD-10-CM | POA: Diagnosis not present

## 2024-06-13 LAB — GLUCOSE, CAPILLARY
Glucose-Capillary: 103 mg/dL — ABNORMAL HIGH (ref 70–99)
Glucose-Capillary: 109 mg/dL — ABNORMAL HIGH (ref 70–99)
Glucose-Capillary: 130 mg/dL — ABNORMAL HIGH (ref 70–99)
Glucose-Capillary: 133 mg/dL — ABNORMAL HIGH (ref 70–99)
Glucose-Capillary: 137 mg/dL — ABNORMAL HIGH (ref 70–99)

## 2024-06-13 MED ORDER — ORAL CARE MOUTH RINSE
15.0000 mL | OROMUCOSAL | Status: DC
  Administered 2024-06-13 – 2024-06-21 (×32): 15 mL via OROMUCOSAL

## 2024-06-13 MED ORDER — ORAL CARE MOUTH RINSE
15.0000 mL | OROMUCOSAL | Status: DC | PRN
  Administered 2024-06-20: 15 mL via OROMUCOSAL

## 2024-06-13 NOTE — Plan of Care (Signed)
  Problem: Intracerebral Hemorrhage Tissue Perfusion: Goal: Complications of Intracerebral Hemorrhage will be minimized Outcome: Progressing   Problem: Nutrition: Goal: Risk of aspiration will decrease Outcome: Progressing   Problem: Skin Integrity: Goal: Risk for impaired skin integrity will decrease Outcome: Progressing   Problem: Tissue Perfusion: Goal: Adequacy of tissue perfusion will improve Outcome: Progressing   Problem: Nutrition: Goal: Risk of aspiration will decrease Outcome: Progressing Goal: Dietary intake will improve Outcome: Progressing

## 2024-06-13 NOTE — Evaluation (Signed)
 RT Evaluate and Treat Note  06/13/2024   Breathing is (select one): Same as normal    The following was found on auscultation (select multiple):  Bilateral Breath Sounds: Diminished (06/13/24 0340)  R Upper  Breath Sounds: Diminished (06/13/24 0340) L Upper Breath Sounds: Diminished (06/13/24 0340) R Lower Breath Sounds: Diminished (06/13/24 0340) L Lower Breath Sounds: Diminished (06/13/24 0340)    Cough Assessment: Cough: Strong;Productive (06/13/24 0758)    Most Recent Chest Xray:... (No results found.    The following medications and/or interventions were ordered/changed/discontinued as part of the Respiratory Treatment protocol:   Medication Changes: None   Airway Clearance Changes: None   Oxygen Therapy Changes: None

## 2024-06-13 NOTE — Plan of Care (Signed)
  Problem: Intracerebral Hemorrhage Tissue Perfusion: Goal: Complications of Intracerebral Hemorrhage will be minimized Outcome: Progressing   Problem: Nutrition: Goal: Risk of aspiration will decrease Outcome: Progressing Goal: Dietary intake will improve Outcome: Progressing   Problem: Fluid Volume: Goal: Ability to maintain a balanced intake and output will improve Outcome: Progressing   Problem: Skin Integrity: Goal: Risk for impaired skin integrity will decrease Outcome: Progressing   Problem: Tissue Perfusion: Goal: Adequacy of tissue perfusion will improve Outcome: Progressing   Problem: Clinical Measurements: Goal: Ability to maintain clinical measurements within normal limits will improve Outcome: Progressing Goal: Will remain free from infection Outcome: Progressing Goal: Diagnostic test results will improve Outcome: Progressing Goal: Respiratory complications will improve Outcome: Progressing Goal: Cardiovascular complication will be avoided Outcome: Progressing   Problem: Activity: Goal: Risk for activity intolerance will decrease Outcome: Progressing   Problem: Nutrition: Goal: Adequate nutrition will be maintained Outcome: Progressing   Problem: Elimination: Goal: Will not experience complications related to bowel motility Outcome: Progressing Goal: Will not experience complications related to urinary retention Outcome: Progressing   Problem: Pain Managment: Goal: General experience of comfort will improve and/or be controlled Outcome: Progressing   Problem: Safety: Goal: Ability to remain free from injury will improve Outcome: Progressing   Problem: Skin Integrity: Goal: Risk for impaired skin integrity will decrease Outcome: Progressing   Problem: Activity: Goal: Ability to tolerate increased activity will improve Outcome: Progressing   Problem: Respiratory: Goal: Patent airway maintenance will improve Outcome: Progressing   Problem:  Intracerebral Hemorrhage Tissue Perfusion: Goal: Complications of Intracerebral Hemorrhage will be minimized Outcome: Progressing   Problem: Nutrition: Goal: Risk of aspiration will decrease Outcome: Progressing Goal: Dietary intake will improve Outcome: Progressing   Problem: Intracerebral Hemorrhage Tissue Perfusion: Goal: Complications of Intracerebral Hemorrhage will be minimized Outcome: Progressing   Problem: Coping: Goal: Will verbalize positive feelings about self Outcome: Progressing Goal: Will identify appropriate support needs Outcome: Progressing   Problem: Health Behavior/Discharge Planning: Goal: Ability to manage health-related needs will improve Outcome: Progressing Goal: Goals will be collaboratively established with patient/family Outcome: Progressing   Problem: Self-Care: Goal: Ability to participate in self-care as condition permits will improve Outcome: Progressing Goal: Verbalization of feelings and concerns over difficulty with self-care will improve Outcome: Progressing Goal: Ability to communicate needs accurately will improve Outcome: Progressing   Problem: Nutrition: Goal: Risk of aspiration will decrease Outcome: Progressing Goal: Dietary intake will improve Outcome: Progressing

## 2024-06-13 NOTE — TOC Progression Note (Addendum)
 Transition of Care [facility]) - Progression Note    Patient Details  Name: Garrett [name] MRN: [id number] Date of Birth: November 12, 1975  [facility]) CM/SW Contact  Lendia Dais, [place] Phone Number: 06/13/2024, 4:17 PM  Clinical Narrative:  CSW spoke to Lorraine of [name] in [place], [place]. Tammy stated that she did not received the referral. CSW confirmed email of [email or web address], Tammy stated that was correct. CSW resent referral to email.  CSW attempted to contact [place] from [place] and left [place] a VM.  CSW will continue to follow.     Expected Discharge Plan: Long Term Nursing Home Barriers to Discharge: Continued Medical Work up, SNF Pending Medicaid, SNF Pending bed offer, SNF Pending payor source - [facility], Inadequate or no insurance (New trach)               Expected Discharge Plan and Services In-house Referral: Clinical Social Work, Hospice / Palliative Care   Post Acute Care Choice: Skilled Nursing Facility Living arrangements for the past 2 months: Single Family Home                                       Social Drivers of Health (SDOH) Interventions SDOH Screenings   Food Insecurity: Patient Unable To Answer (10/29/2023)  Social Connections: Unknown (06/23/2023)   Received from [facility]  Tobacco Use: High Risk (10/31/2023)    Readmission Risk Interventions     No data to display

## 2024-06-13 NOTE — Progress Notes (Signed)
 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  He was admitted for ICH with acute respiratory failure with hypoxia.  Initial BP 262/156.  Emergently intubated.  Admitted to neuro ICU.  Hospital course complicated by failed went with recurrent ileus, episodes of infection/colonization of respiratory tract with MRSA, repeated transfers to ICU, tracheostomy and PEG dislodgment.   Has had a prolonged hospital course, now status post tracheostomy and PEG tube, awaiting nursing home placement.   Per ICM note from 10/15, there was an SNF that was willing to take patient but only if he remained on continuous trach/went (without weaning).  PCCM MD discussed with patient's mother who does not want patient to go to a facility where he will not be weaned.  Patient has been denied by [facility] and safety inspector. Therefore patient at this time remains in house with ongoing vent weaning trials.   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   Controlled now on metoprolol  tartrate 75 mg bid, amlodipine  10 and PRNs.  Continue management.   Acute on chronic resp failure w hypoxia and hypercapnea  Trach dependence-nocturnal ventilator dependence MRSA, Pseudomonas with trach Decannulation preparatory due to ineffective airway clearance Chronic hypoventilation: S/p tracheostomy redo 7/16.  Not a candidate for decannulation given ineffective airway clearance. Continue trach collar during day with vent support at night. Continue trach care per protocol. Trach replaced last on 06/08/24. Pulmonary following weekly -last chest x-ray 10/13-poor depth of inspiration with decreased right basilar atelectasis 10/15: I discussed with Dr. Fernanda, PCCM MD.  He discussed with patient's mother 10/14.  She does not want patient to go to a SNF that will not do vent weaning (there was a facility that was looking to accept him if he had remained on continuous vent).  He also advised that patient had been denied by [facility].  Currently trying vent weaning trials.  10/19, tracheostomy tube was exchanged, ongoing daytime tracheostomy collar weaning attempts in the daytime, tolerates about 3 to 3.5 hours and back on vent including vent at that time.   Exposure keratopathy of left eye: Currently on only artificial tears to both eyes. Stable.   Hypernatremia Resolved.Cont TF w/ free water  RD following, On 150 cc q 6 hr. Intermittently monitor labs. Periodically check labs may be once weekly while here. Will check labs tomorrow.    Elevated liver enzymes:  Mild.Unclear etiology.  Resolved.  CK normal.     Inadequate oral intake Estimated Body mass index is 31.17 kg/m.: PEG replaced by IR on  05/15/24 after becoming dislodged again RD is following, continue tube feeding and supplements and augment.   Anemia of chronic disease Stable.   Body mass index is 30.81 kg/m./Obesity class I. Complicates care.    DVT prophylaxis: enoxaparin  (LOVENOX ) injection 40 mg Start: 03/06/24 1000 SCDs Start: 10/27/23 2226     Code Status: Full Code Family Communication:   Status is: Inpatient Remains inpatient appropriate because: Trach and Vent dependent   Subjective: Patient evaluated at the bedside with nurse in the room.  Nonverbal.  Trach collar remains stable in place.  Examination:  General: Chronically ill middle-age man in vegetative state. HEENT: [place]/AT. Trach collar stable in place. CV: RRR. No murmurs, rubs, or gallops. No LE edema Pulmonary: Anterior lung sounds clear to auscultation.  Mild rhonchorous lung sounds. Abdominal: Soft, nontender, nondistended. Normal bowel sounds. Extremities: Palpable radial and DP pulses. Skin: Warm and dry. No obvious rash or lesions. GU: Foley catheter stable in place with yellowish urine in bag Neuro: Alert with eyes open. Noncommunicative/nonverbal. Unable to follow commands.    Diet Orders (From admission, onward)     Start     Ordered   04/09/24 0737  Diet NPO time specified  Diet effective now        04/09/24 0737            Objective: Vitals:   06/13/24 0500 06/13/24 0600 06/13/24 0700 06/13/24 0736  BP: 104/75 112/83 105/73   Pulse: 72 73 71   Resp: 12 12 12    Temp:    99.2 F (37.3 C)  TempSrc:    Oral  SpO2: 96% 96% 97%   Weight: 96.7 kg     Height:        Intake/Output Summary (Last 24 hours) at 06/13/2024 0753 Last data filed at 06/13/2024 9378 Gross per 24 hour  Intake 900 ml  Output 1600 ml  Net -700 ml   Filed Weights   06/11/24 0500 06/12/24 0500 06/13/24 0500  Weight: 91.9 kg 91.7 kg 96.7 kg    Scheduled Meds:  amLODipine   10 mg Per Tube Daily   artificial tears   Both Eyes BID    Chlorhexidine  Gluconate Cloth  6 each Topical Daily   enoxaparin  (LOVENOX ) injection  40 mg Subcutaneous Daily   famotidine   20 mg Per Tube BID   feeding supplement (JEVITY 1.5 CAL/FIBER)  237 mL Per Tube 5 X Daily   feeding supplement (PROSource TF20)  60 mL Per Tube TID   free water   150 mL Per Tube Q6H   insulin  aspart  0-9 Units Subcutaneous Q6H   metoprolol  tartrate  75 mg Per Tube BID   multivitamin with minerals  1 tablet Per Tube Daily   mouth rinse  15 mL Mouth Rinse Q2H   polyethylene glycol  17 g Per Tube Daily   Continuous Infusions:  Nutritional status Signs/Symptoms: NPO status Interventions: Tube feeding, Prostat, MVI, Juven Body mass index is 32.41 kg/m.  Data Reviewed:   CBC: Recent Labs  Lab 06/06/24 1306  WBC 7.6  HGB 10.1*  HCT 32.8*  MCV 90.4  PLT 372   Basic Metabolic Panel: Recent Labs  Lab 06/06/24 1306  NA 138  K 4.2  CL 100  CO2 28  GLUCOSE 137*  BUN 21*  CREATININE 0.47*  CALCIUM 9.1   GFR:  Estimated Creatinine Clearance: 128.7 mL/min (A) (by C-G formula based on SCr of 0.47 mg/dL (L)). Liver Function Tests: Recent Labs  Lab 06/06/24 1306  AST 26  ALT 56*  ALKPHOS 59  BILITOT 0.3  PROT 7.2  ALBUMIN  2.8*   No results for input(s): LIPASE, AMYLASE in the last 168 hours. No results for input(s): AMMONIA in the last 168 hours. Coagulation Profile: No results for input(s): INR, PROTIME in the last 168 hours. Cardiac Enzymes: Recent Labs  Lab 06/06/24 1306  CKTOTAL 97   BNP (last 3 results) No results for input(s): PROBNP in the last 8760 hours. HbA1C: No results for input(s): HGBA1C in the last 72 hours. CBG: Recent Labs  Lab 06/12/24 0353 06/12/24 0508 06/12/24 1120 06/12/24 1703 06/12/24 2343  GLUCAP 93 86 158* 92 149*   Lipid Profile: No results for input(s): CHOL, HDL, LDLCALC, TRIG, CHOLHDL, LDLDIRECT in the last 72 hours. Thyroid Function Tests: No results for input(s): TSH,  T4TOTAL, FREET4, T3FREE, THYROIDAB in the last 72 hours. Anemia Panel: No results for input(s): VITAMINB12, FOLATE, FERRITIN, TIBC, IRON, RETICCTPCT in the last 72 hours. Sepsis Labs: No results for input(s): PROCALCITON, LATICACIDVEN in the last 168 hours.  No results found for this or any previous visit (from the past 240 hours).       Radiology Studies: No results found.       LOS: 230 days   Time spent= 35 mins    Claretta CHRISTELLA Alderman, MD [facility]  If 7PM-7AM, please contact night-coverage  06/13/2024, 7:53 AM

## 2024-06-14 DIAGNOSIS — I613 Nontraumatic intracerebral hemorrhage in brain stem: Secondary | ICD-10-CM | POA: Diagnosis not present

## 2024-06-14 DIAGNOSIS — I619 Nontraumatic intracerebral hemorrhage, unspecified: Secondary | ICD-10-CM | POA: Diagnosis not present

## 2024-06-14 DIAGNOSIS — A419 Sepsis, unspecified organism: Secondary | ICD-10-CM | POA: Diagnosis not present

## 2024-06-14 NOTE — Plan of Care (Signed)
  Problem: Intracerebral Hemorrhage Tissue Perfusion: Goal: Complications of Intracerebral Hemorrhage will be minimized Outcome: Progressing   Problem: Nutrition: Goal: Risk of aspiration will decrease Outcome: Progressing Goal: Dietary intake will improve Outcome: Progressing   Problem: Fluid Volume: Goal: Ability to maintain a balanced intake and output will improve Outcome: Progressing   Problem: Skin Integrity: Goal: Risk for impaired skin integrity will decrease Outcome: Progressing   Problem: Tissue Perfusion: Goal: Adequacy of tissue perfusion will improve Outcome: Progressing   Problem: Clinical Measurements: Goal: Ability to maintain clinical measurements within normal limits will improve Outcome: Progressing Goal: Will remain free from infection Outcome: Progressing Goal: Diagnostic test results will improve Outcome: Progressing Goal: Respiratory complications will improve Outcome: Progressing Goal: Cardiovascular complication will be avoided Outcome: Progressing   Problem: Activity: Goal: Risk for activity intolerance will decrease Outcome: Progressing   Problem: Nutrition: Goal: Adequate nutrition will be maintained Outcome: Progressing   Problem: Elimination: Goal: Will not experience complications related to bowel motility Outcome: Progressing Goal: Will not experience complications related to urinary retention Outcome: Progressing   Problem: Pain Managment: Goal: General experience of comfort will improve and/or be controlled Outcome: Progressing   Problem: Safety: Goal: Ability to remain free from injury will improve Outcome: Progressing   Problem: Skin Integrity: Goal: Risk for impaired skin integrity will decrease Outcome: Progressing   Problem: Activity: Goal: Ability to tolerate increased activity will improve Outcome: Progressing   Problem: Respiratory: Goal: Patent airway maintenance will improve Outcome: Progressing   Problem:  Activity: Goal: Ability to tolerate increased activity will improve Outcome: Progressing   Problem: Respiratory: Goal: Ability to maintain a clear airway and adequate ventilation will improve Outcome: Progressing   Problem: Role Relationship: Goal: Method of communication will improve Outcome: Progressing   Problem: Intracerebral Hemorrhage Tissue Perfusion: Goal: Complications of Intracerebral Hemorrhage will be minimized Outcome: Progressing   Problem: Nutrition: Goal: Risk of aspiration will decrease Outcome: Progressing Goal: Dietary intake will improve Outcome: Progressing   Problem: Intracerebral Hemorrhage Tissue Perfusion: Goal: Complications of Intracerebral Hemorrhage will be minimized Outcome: Progressing   Problem: Coping: Goal: Will verbalize positive feelings about self Outcome: Progressing Goal: Will identify appropriate support needs Outcome: Progressing   Problem: Health Behavior/Discharge Planning: Goal: Ability to manage health-related needs will improve Outcome: Progressing Goal: Goals will be collaboratively established with patient/family Outcome: Progressing   Problem: Self-Care: Goal: Ability to participate in self-care as condition permits will improve Outcome: Progressing Goal: Verbalization of feelings and concerns over difficulty with self-care will improve Outcome: Progressing Goal: Ability to communicate needs accurately will improve Outcome: Progressing   Problem: Nutrition: Goal: Risk of aspiration will decrease Outcome: Progressing Goal: Dietary intake will improve Outcome: Progressing

## 2024-06-14 NOTE — Evaluation (Signed)
 RT Evaluate and Treat Note  06/14/2024   Breathing is (select one): Same as normal    The following was found on auscultation (select multiple):  Bilateral Breath Sounds: Diminished (06/14/24 0902)  R Upper  Breath Sounds: Diminished (06/14/24 0902) L Upper Breath Sounds: Diminished (06/14/24 0902) R Lower Breath Sounds: Diminished (06/14/24 0902) L Lower Breath Sounds: Diminished (06/14/24 0902)    Cough Assessment: Cough: Productive;Strong (06/14/24 0902)    Most Recent Chest Xray:... 06/02/24   FINDINGS: Tracheostomy tube in satisfactory position. Poor depth of inspiration with improvement. Stable borderline enlarged cardiac silhouette. Decreased right basilar atelectasis. Clear left lung. Lower thoracic spine degenerative changes.   IMPRESSION: Poor depth of inspiration with decreased right basilar atelectasis  The following medications and/or interventions were ordered/changed/discontinued as part of the Respiratory Treatment protocol:   Medication Changes: none    Airway Clearance Changes: none    Oxygen Therapy Changes:none

## 2024-06-14 NOTE — Plan of Care (Signed)
  Problem: Nutrition: Goal: Risk of aspiration will decrease Outcome: Progressing Goal: Dietary intake will improve Outcome: Progressing   

## 2024-06-14 NOTE — Progress Notes (Signed)
 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  He was admitted for ICH with acute respiratory failure with hypoxia.  Initial BP 262/156.  Emergently intubated.  Admitted to neuro ICU.  Hospital course complicated by failed went with recurrent ileus, episodes of infection/colonization of respiratory tract with MRSA, repeated transfers to ICU, tracheostomy and PEG dislodgment.   Has had a prolonged hospital course, now status post tracheostomy and PEG tube, awaiting nursing home placement.   Per ICM note from 10/15, there was an SNF that was willing to take patient but only if he remained on continuous trach/went (without weaning).  PCCM MD discussed with patient's mother who does not want patient to go to a facility where he will not be weaned.  Patient has been denied by [facility] and [employer]. Therefore patient at this time remains in house with ongoing vent weaning trials.   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   Controlled now on metoprolol  tartrate 75 mg bid, amlodipine  10 and PRNs.  Continue management.   Acute on chronic resp failure w hypoxia and hypercapnea  Trach dependence-nocturnal ventilator dependence MRSA, Pseudomonas with trach Decannulation preparatory due to ineffective airway clearance Chronic hypoventilation: S/p tracheostomy redo 7/16.  Not a candidate for decannulation given ineffective airway clearance. Continue trach collar during day with vent support at night. Continue trach care per protocol. Trach replaced last on 06/08/24. Pulmonary following weekly -last chest x-ray 10/13-poor depth of inspiration with decreased right basilar atelectasis 10/15: I discussed with Dr. Fernanda, PCCM MD.  He discussed with patient's mother 10/14.  She does not want patient to go to a SNF that will not do vent weaning (there was a facility that was looking to accept him if he had remained on continuous vent).  He also advised that patient had been denied by [facility].  Currently trying vent weaning trials.  10/19, tracheostomy tube was exchanged, ongoing daytime tracheostomy collar weaning attempts in the daytime, tolerates about 3 to 3.5 hours and back on vent including vent at that time.   Exposure keratopathy of left eye: Currently on only artificial tears to both eyes. Stable.   Hypernatremia Resolved.Cont TF w/ free water  RD following, On 150 cc q 6 hr. Intermittently monitor labs. Periodically check labs may be once weekly while here.  Will check labs tomorrow.    Elevated liver enzymes:  Mild.Unclear etiology.  Resolved.  CK normal.     Inadequate oral  intake Estimated Body mass index is 31.17 kg/m.: PEG replaced by IR on 05/15/24 after becoming dislodged again RD is following, continue tube feeding and supplements and augment.   Anemia of chronic disease Stable.   Body mass index is 30.81 kg/m./Obesity class I. Complicates care.    DVT prophylaxis: enoxaparin  (LOVENOX ) injection 40 mg Start: 03/06/24 1000 SCDs Start: 10/27/23 2226     Code Status: Full Code Family Communication:   Status is: Inpatient Remains inpatient appropriate because: Trach and Vent dependent   Subjective: Patient evaluated lying comfortably in bed. Remains nonverbal. No acute changes.  Examination:  General: Chronically ill middle-age man in vegetative state. HEENT: [place]/AT. Trach collar stable in place. CV: RRR. No murmurs, rubs, or gallops. No LE edema Pulmonary: Anterior lung sounds clear to auscultation.  Mild rhonchorous lung sounds. Abdominal: Soft, nontender, nondistended. Normal bowel sounds. Extremities: Palpable radial and DP pulses. Skin: Warm and dry. No obvious rash or lesions. GU: Foley catheter stable in place with yellowish urine in bag Neuro: Alert with eyes open. Noncommunicative/nonverbal. Unable to follow commands.    Diet Orders (From admission, onward)     Start     Ordered   04/09/24 0737  Diet NPO time specified  Diet effective now        04/09/24 0737            Objective: Vitals:   06/14/24 0500 06/14/24 0600 06/14/24 0700 06/14/24 0752  BP: 100/73 101/70 100/71   Pulse: 65 69 64   Resp: 12 12 12    Temp:    99.5 F (37.5 C)  TempSrc:    Axillary  SpO2: 98% 98% 99%   Weight:      Height:        Intake/Output Summary (Last 24 hours) at 06/14/2024 0756 Last data filed at 06/14/2024 0503 Gross per 24 hour  Intake 2245 ml  Output 1485 ml  Net 760 ml   Filed Weights   06/12/24 0500 06/13/24 0500 06/14/24 0417  Weight: 91.7 kg 96.7 kg 97 kg    Scheduled Meds:  amLODipine   10 mg Per Tube Daily    artificial tears   Both Eyes BID   Chlorhexidine  Gluconate Cloth  6 each Topical Daily   enoxaparin  (LOVENOX ) injection  40 mg Subcutaneous Daily   famotidine   20 mg Per Tube BID   feeding supplement (JEVITY 1.5 CAL/FIBER)  237 mL Per Tube 5 X Daily   feeding supplement (PROSource TF20)  60 mL Per Tube TID   free water   150 mL Per Tube Q6H   insulin  aspart  0-9 Units Subcutaneous Q6H   metoprolol  tartrate  75 mg Per Tube BID   multivitamin with minerals  1 tablet Per Tube Daily   mouth rinse  15 mL Mouth Rinse 4 times per day   polyethylene glycol  17 g Per Tube Daily   Continuous Infusions:  Nutritional status Signs/Symptoms: NPO status Interventions: Tube feeding, Prostat, MVI, Juven Body mass index is 32.52 kg/m.  Data Reviewed:   CBC: No results for input(s): WBC, NEUTROABS, HGB, HCT, MCV, PLT in the last 168 hours.  Basic Metabolic Panel: No results for input(s): NA, K, CL, CO2, GLUCOSE, BUN, CREATININE, CALCIUM, MG, PHOS in the last 168 hours.  GFR: Estimated Creatinine Clearance: 128.8 mL/min (A) (by C-G formula based on SCr of 0.47  mg/dL (L)). Liver Function Tests: No results for input(s): AST, ALT, ALKPHOS, BILITOT, PROT, ALBUMIN  in the last 168 hours.  No results for input(s): LIPASE, AMYLASE in the last 168 hours. No results for input(s): AMMONIA in the last 168 hours. Coagulation Profile: No results for input(s): INR, PROTIME in the last 168 hours. Cardiac Enzymes: No results for input(s): CKTOTAL, CKMB, CKMBINDEX, TROPONINI in the last 168 hours.  BNP (last 3 results) No results for input(s): PROBNP in the last 8760 hours. HbA1C: No results for input(s): HGBA1C in the last 72 hours. CBG: Recent Labs  Lab 06/13/24 0604 06/13/24 0734 06/13/24 1346 06/13/24 1744 06/13/24 2346  GLUCAP 103* 130* 109* 133* 137*   Lipid Profile: No results for input(s): CHOL, HDL, LDLCALC, TRIG,  CHOLHDL, LDLDIRECT in the last 72 hours. Thyroid Function Tests: No results for input(s): TSH, T4TOTAL, FREET4, T3FREE, THYROIDAB in the last 72 hours. Anemia Panel: No results for input(s): VITAMINB12, FOLATE, FERRITIN, TIBC, IRON, RETICCTPCT in the last 72 hours. Sepsis Labs: No results for input(s): PROCALCITON, LATICACIDVEN in the last 168 hours.  No results found for this or any previous visit (from the past 240 hours).       Radiology Studies: No results found.       LOS: 231 days   Time spent= 35 mins    Claretta CHRISTELLA Alderman, MD [facility]  If 7PM-7AM, please contact night-coverage  06/14/2024, 7:56 AM

## 2024-06-15 DIAGNOSIS — I619 Nontraumatic intracerebral hemorrhage, unspecified: Secondary | ICD-10-CM | POA: Diagnosis not present

## 2024-06-15 DIAGNOSIS — A419 Sepsis, unspecified organism: Secondary | ICD-10-CM | POA: Diagnosis not present

## 2024-06-15 DIAGNOSIS — I613 Nontraumatic intracerebral hemorrhage in brain stem: Secondary | ICD-10-CM | POA: Diagnosis not present

## 2024-06-15 LAB — GLUCOSE, CAPILLARY
Glucose-Capillary: 135 mg/dL — ABNORMAL HIGH (ref 70–99)
Glucose-Capillary: 130 mg/dL — ABNORMAL HIGH (ref 70–99)
Glucose-Capillary: 138 mg/dL — ABNORMAL HIGH (ref 70–99)

## 2024-06-15 LAB — BASIC METABOLIC PANEL WITH GFR
Anion gap: 9 (ref 5–15)
BUN: 25 mg/dL — ABNORMAL HIGH (ref 6–20)
CO2: 30 mmol/L (ref 22–32)
Calcium: 9.4 mg/dL (ref 8.9–10.3)
Chloride: 99 mmol/L (ref 98–111)
Creatinine, Ser: 0.49 mg/dL — ABNORMAL LOW (ref 0.61–1.24)
GFR, Estimated: >60 mL/min (ref >60)
Glucose, Bld: 111 mg/dL — ABNORMAL HIGH (ref 70–99)
Potassium: 4.4 mmol/L (ref 3.5–5.1)
Sodium: 138 mmol/L (ref 135–145)

## 2024-06-15 LAB — CBC
HCT: 37.5 % — ABNORMAL LOW (ref 39.0–52.0)
Hemoglobin: 11.3 g/dL — ABNORMAL LOW (ref 13.0–17.0)
MCH: 27.6 pg (ref 26.0–34.0)
MCHC: 30.1 g/dL (ref 30.0–36.0)
MCV: 91.7 fL (ref 80.0–100.0)
Platelets: 330 K/uL (ref 150–400)
RBC: 4.09 MIL/uL — ABNORMAL LOW (ref 4.22–5.81)
RDW: 15.9 % — ABNORMAL HIGH (ref 11.5–15.5)
WBC: 7.8 K/uL (ref 4.0–10.5)
nRBC: 0 % (ref 0.0–0.2)

## 2024-06-15 NOTE — Progress Notes (Signed)
 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  He was admitted for ICH with acute respiratory failure with hypoxia.  Initial BP 262/156.  Emergently intubated.  Admitted to neuro ICU.  Hospital course complicated by failed went with recurrent ileus, episodes of infection/colonization of respiratory tract with MRSA, repeated transfers to ICU, tracheostomy and PEG dislodgment.   Has had a prolonged hospital course, now status post tracheostomy and PEG tube, awaiting nursing home placement.   Per ICM note from 10/15, there was an SNF that was willing to take patient but only if he remained on continuous trach/went (without weaning).  PCCM MD discussed with patient's mother who does not want patient to go to a facility where he will not be weaned.  Patient has been denied by [facility] and [employer]. Therefore patient at this time remains in house with ongoing vent weaning trials.   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   Controlled now on metoprolol  tartrate 75 mg bid, amlodipine  10 and PRNs. Continue management.   Acute on chronic resp failure w hypoxia and hypercapnea  Trach dependence-nocturnal ventilator dependence MRSA, Pseudomonas with trach Decannulation preparatory due to ineffective airway clearance Chronic hypoventilation: S/p tracheostomy redo 7/16.  Not a candidate for decannulation given ineffective airway clearance. Continue trach collar during day with vent support at night. Continue trach care per protocol. Trach replaced last on 06/08/24. Pulmonary following weekly -last chest x-ray 10/13-poor depth of inspiration with decreased right basilar atelectasis 10/15: I discussed with Dr. Fernanda, PCCM MD.  He discussed with patient's mother 10/14.  She does not want patient to go to a SNF that will not do vent weaning (there was a facility that was looking to accept him if he had remained on continuous vent).  He also advised that patient had been [facility] by [facility].  Currently trying vent weaning trials.  10/19, tracheostomy tube was exchanged, ongoing daytime tracheostomy collar weaning attempts in the daytime, tolerates about 3 to 3.5 hours and back on vent including vent at that time.   Exposure keratopathy of left eye: Currently on only artificial tears to both eyes. Stable.   Hypernatremia Resolved.Cont TF w/ free water  RD following, On 150 cc q 6 hr. Intermittently monitor labs. Periodically check labs may be once weekly while here. Repeating  labs today.    Elevated liver enzymes:  Mild. Unclear etiology. Resolved. CK normal.     Inadequate oral intake Estimated  Body mass index is 31.17 kg/m.: PEG replaced by IR on 05/15/24 after becoming dislodged again RD is following, continue tube feeding and supplements and augment.   Anemia of chronic disease Stable.   Body mass index is 30.81 kg/m./Obesity class I. Complicates care.    DVT prophylaxis: enoxaparin  (LOVENOX ) injection 40 mg Start: 03/06/24 1000 SCDs Start: 10/27/23 2226     Code Status: Full Code Family Communication:   Status is: Inpatient Remains inpatient appropriate because: Trach and Vent dependent   Subjective: Patient evaluated lying comfortably in bed with eyes open. Remains nonverbal. No acute changes.  Examination:  General: Chronically ill middle-age man in vegetative state. HEENT: [place]/AT. Trach collar stable in place. CV: RRR. No murmurs, rubs, or gallops. No LE edema Pulmonary: Anterior lung sounds clear to auscultation. Mild wheezes.  Abdominal: Soft, nondistented. Normal bowel sounds. Extremities: Palpable radial and DP pulses. Skin: Warm and dry. No obvious rash or lesions. GU: Foley catheter stable in place with yellowish urine in bag Neuro: Alert with eyes open. Noncommunicative/nonverbal. Unable to follow commands.    Diet Orders (From admission, onward)     Start     Ordered   04/09/24 0737  Diet NPO time specified  Diet effective now        04/09/24 0737            Objective: Vitals:   06/15/24 0500 06/15/24 0518 06/15/24 0600 06/15/24 0700  BP: (!) 83/64 97/76 95/68  93/67  Pulse: 61 (!) 59 62 61  Resp: 12 12 12 12   Temp:      TempSrc:      SpO2: 98% 100% 97% 98%  Weight: 91.4 kg     Height:        Intake/Output Summary (Last 24 hours) at 06/15/2024 0737 Last data filed at 06/15/2024 0600 Gross per 24 hour  Intake 850 ml  Output 1455 ml  Net -605 ml   Filed Weights   06/13/24 0500 06/14/24 0417 06/15/24 0500  Weight: 96.7 kg 97 kg 91.4 kg    Scheduled Meds:  amLODipine   10 mg Per Tube Daily   artificial tears   Both Eyes  BID   Chlorhexidine  Gluconate Cloth  6 each Topical Daily   enoxaparin  (LOVENOX ) injection  40 mg Subcutaneous Daily   famotidine   20 mg Per Tube BID   feeding supplement (JEVITY 1.5 CAL/FIBER)  237 mL Per Tube 5 X Daily   feeding supplement (PROSource TF20)  60 mL Per Tube TID   free water   150 mL Per Tube Q6H   insulin  aspart  0-9 Units Subcutaneous Q6H   metoprolol  tartrate  75 mg Per Tube BID   multivitamin with minerals  1 tablet Per Tube Daily   mouth rinse  15 mL Mouth Rinse 4 times per day   polyethylene glycol  17 g Per Tube Daily   Continuous Infusions:  Nutritional status Signs/Symptoms: NPO status Interventions: Tube feeding, Prostat, MVI, Juven Body mass index is 30.64 kg/m.  Data Reviewed:   CBC: No results for input(s): WBC, NEUTROABS, HGB, HCT, MCV, PLT in the last 168 hours.  Basic Metabolic Panel: No results for input(s): NA, K, CL, CO2, GLUCOSE, BUN, CREATININE, CALCIUM, MG, PHOS in the last 168 hours.  GFR: Estimated Creatinine Clearance: 125.3 mL/min (A) (by C-G formula based on SCr of 0.47 mg/dL (L)). Liver  Function Tests: No results for input(s): AST, ALT, ALKPHOS, BILITOT, PROT, ALBUMIN  in the last 168 hours.  No results for input(s): LIPASE, AMYLASE in the last 168 hours. No results for input(s): AMMONIA in the last 168 hours. Coagulation Profile: No results for input(s): INR, PROTIME in the last 168 hours. Cardiac Enzymes: No results for input(s): CKTOTAL, CKMB, CKMBINDEX, TROPONINI in the last 168 hours.  BNP (last 3 results) No results for input(s): PROBNP in the last 8760 hours. HbA1C: No results for input(s): HGBA1C in the last 72 hours. CBG: Recent Labs  Lab 06/13/24 0604 06/13/24 0734 06/13/24 1346 06/13/24 1744 06/13/24 2346  GLUCAP 103* 130* 109* 133* 137*   Lipid Profile: No results for input(s): CHOL, HDL, LDLCALC, TRIG, CHOLHDL, LDLDIRECT in the  last 72 hours. Thyroid Function Tests: No results for input(s): TSH, T4TOTAL, FREET4, T3FREE, THYROIDAB in the last 72 hours. Anemia Panel: No results for input(s): VITAMINB12, FOLATE, FERRITIN, TIBC, IRON, RETICCTPCT in the last 72 hours. Sepsis Labs: No results for input(s): PROCALCITON, LATICACIDVEN in the last 168 hours.  No results found for this or any previous visit (from the past 240 hours).       Radiology Studies: No results found.       LOS: 232 days   Time spent= 25 mins    Claretta CHRISTELLA Alderman, MD [facility]  If 7PM-7AM, please contact night-coverage  06/15/2024, 7:37 AM

## 2024-06-15 NOTE — Evaluation (Signed)
 RT Evaluate and Treat Note  06/15/2024   Breathing is (select one): Same as normal    The following was found on auscultation (select multiple):  Bilateral Breath Sounds: Rhonchi;Diminished (06/15/24 0831)  R Upper  Breath Sounds: Rhonchi (06/15/24 0831) L Upper Breath Sounds: Rhonchi (06/15/24 0831) R Lower Breath Sounds: Rhonchi (06/15/24 0831) L Lower Breath Sounds: Rhonchi (06/15/24 0831)    Cough Assessment: Cough: Productive (06/15/24 0831)    Most Recent Chest Xray:... (No results found.    The following medications and/or interventions were ordered/changed/discontinued as part of the Respiratory Treatment protocol:   Medication Changes: none    Airway Clearance Changes: none   Oxygen Therapy Changes:none

## 2024-06-15 NOTE — Plan of Care (Signed)
  Problem: Intracerebral Hemorrhage Tissue Perfusion: Goal: Complications of Intracerebral Hemorrhage will be minimized Outcome: Progressing   Problem: Nutrition: Goal: Risk of aspiration will decrease Outcome: Progressing Goal: Dietary intake will improve Outcome: Progressing   Problem: Fluid Volume: Goal: Ability to maintain a balanced intake and output will improve Outcome: Progressing   Problem: Skin Integrity: Goal: Risk for impaired skin integrity will decrease Outcome: Progressing   Problem: Tissue Perfusion: Goal: Adequacy of tissue perfusion will improve Outcome: Progressing   Problem: Clinical Measurements: Goal: Ability to maintain clinical measurements within normal limits will improve Outcome: Progressing Goal: Will remain free from infection Outcome: Progressing Goal: Diagnostic test results will improve Outcome: Progressing Goal: Respiratory complications will improve Outcome: Progressing Goal: Cardiovascular complication will be avoided Outcome: Progressing   Problem: Activity: Goal: Risk for activity intolerance will decrease Outcome: Progressing   Problem: Nutrition: Goal: Adequate nutrition will be maintained Outcome: Progressing   Problem: Elimination: Goal: Will not experience complications related to bowel motility Outcome: Progressing Goal: Will not experience complications related to urinary retention Outcome: Progressing   Problem: Pain Managment: Goal: General experience of comfort will improve and/or be controlled Outcome: Progressing   Problem: Safety: Goal: Ability to remain free from injury will improve Outcome: Progressing   Problem: Skin Integrity: Goal: Risk for impaired skin integrity will decrease Outcome: Progressing   Problem: Activity: Goal: Ability to tolerate increased activity will improve Outcome: Progressing   Problem: Respiratory: Goal: Patent airway maintenance will improve Outcome: Progressing   Problem:  Activity: Goal: Ability to tolerate increased activity will improve Outcome: Progressing   Problem: Respiratory: Goal: Ability to maintain a clear airway and adequate ventilation will improve Outcome: Progressing   Problem: Role Relationship: Goal: Method of communication will improve Outcome: Progressing   Problem: Intracerebral Hemorrhage Tissue Perfusion: Goal: Complications of Intracerebral Hemorrhage will be minimized Outcome: Progressing   Problem: Nutrition: Goal: Risk of aspiration will decrease Outcome: Progressing Goal: Dietary intake will improve Outcome: Progressing   Problem: Intracerebral Hemorrhage Tissue Perfusion: Goal: Complications of Intracerebral Hemorrhage will be minimized Outcome: Progressing   Problem: Coping: Goal: Will verbalize positive feelings about self Outcome: Progressing Goal: Will identify appropriate support needs Outcome: Progressing   Problem: Health Behavior/Discharge Planning: Goal: Ability to manage health-related needs will improve Outcome: Progressing Goal: Goals will be collaboratively established with patient/family Outcome: Progressing   Problem: Self-Care: Goal: Ability to participate in self-care as condition permits will improve Outcome: Progressing Goal: Verbalization of feelings and concerns over difficulty with self-care will improve Outcome: Progressing Goal: Ability to communicate needs accurately will improve Outcome: Progressing   Problem: Nutrition: Goal: Risk of aspiration will decrease Outcome: Progressing Goal: Dietary intake will improve Outcome: Progressing

## 2024-06-16 DIAGNOSIS — J9621 Acute and chronic respiratory failure with hypoxia: Secondary | ICD-10-CM | POA: Diagnosis not present

## 2024-06-16 DIAGNOSIS — Z930 Tracheostomy status: Secondary | ICD-10-CM | POA: Diagnosis not present

## 2024-06-16 DIAGNOSIS — J9622 Acute and chronic respiratory failure with hypercapnia: Secondary | ICD-10-CM | POA: Diagnosis not present

## 2024-06-16 DIAGNOSIS — I613 Nontraumatic intracerebral hemorrhage in brain stem: Secondary | ICD-10-CM | POA: Diagnosis not present

## 2024-06-16 DIAGNOSIS — G835 Locked-in state: Secondary | ICD-10-CM | POA: Diagnosis not present

## 2024-06-16 LAB — GLUCOSE, CAPILLARY
Glucose-Capillary: 132 mg/dL — ABNORMAL HIGH (ref 70–99)
Glucose-Capillary: 129 mg/dL — ABNORMAL HIGH (ref 70–99)
Glucose-Capillary: 151 mg/dL — ABNORMAL HIGH (ref 70–99)
Glucose-Capillary: 113 mg/dL — ABNORMAL HIGH (ref 70–99)
Glucose-Capillary: 123 mg/dL — ABNORMAL HIGH (ref 70–99)
Glucose-Capillary: 95 mg/dL (ref 70–99)
Glucose-Capillary: 123 mg/dL — ABNORMAL HIGH (ref 70–99)
Glucose-Capillary: 100 mg/dL — ABNORMAL HIGH (ref 70–99)
Glucose-Capillary: 148 mg/dL — ABNORMAL HIGH (ref 70–99)
Glucose-Capillary: 127 mg/dL — ABNORMAL HIGH (ref 70–99)
Glucose-Capillary: 126 mg/dL — ABNORMAL HIGH (ref 70–99)

## 2024-06-16 NOTE — Progress Notes (Signed)
 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  He was admitted for ICH with acute respiratory failure with hypoxia.  Initial BP 262/156.  Emergently intubated.  Admitted to neuro ICU.  Hospital course complicated by failed went with recurrent ileus, episodes of infection/colonization of respiratory tract with MRSA, repeated transfers to ICU, tracheostomy and PEG dislodgment.   Has had a prolonged hospital course, now status post tracheostomy and PEG tube, awaiting nursing home placement.   Per ICM note from 10/15, there was an SNF that was willing to take patient but only if he remained on continuous trach/went (without weaning).  PCCM MD discussed with patient's mother who does not want patient to go to a facility where he will not be weaned.  Patient has been denied by [facility] and [facility] [employer] [facility]. Therefore patient at this time remains in house with ongoing vent weaning trials.  Significant Hospital Events: Including procedures, antibiotic start and stop dates in addition to other pertinent events   3/08 - Intubated in ED, pontine bleed/SAH. CT head 17:09 Acute large hemorrhage 4.1 cm in pons with intraventricular extension without hydrocephalus 3/09 - CTA 03:14 AM No change in IPH in pons, similar biparietal [facility] 3/14 - Now awake, appears locked in can follow commands with eyes. 3/16 - Purposeful with left upper extremity  3/22 - Tracheostomy placed at bedside, bleeding issues overnight from trach site 3/24 - PEG (Dr. Sebastian) 3/24 - Sputum culture with MRSA 3/27 - Vomited. TF on hold. Abd film c/w ileus. Added  reglan , got SSE. Did tolerate PSV all day  3/28 - BMs x2 after SSE day prior. Added back TFs at 1/2 rated. Tolerated PS 3/29 - Tolerated PS  3/31 - Tolerating CPAP PS 15/5, TF on hold due to ileus. 4/01 - Con't to hold tube feeds today, restarting vancomycin  and sending tracheal aspirate as peaks are 37, plat 24, driving 19. Thick secretions. Fever overnight.  4/02 - Peaks improved. Ongoing hiccups. Trickle feeding. Neuro exam unchanged.  4/20 - Trach was changed to cuffless #8 4/28 - Not safe to swallow. Limited communication and he follows simple commands. On TF 5/01 - On TC 28%, with large secretions. Working with speech and he is improving to initiate PMV trials under ST supervision   5/05 - On TC 30%, 7 L/min. Trach #6 cuffless, changed 5/1 to facilitate PMV trials  5/12 - On TC 21%, 6 L/min. Trach #6 cuffless, unable to produce sounds or speak on PMV. No significant resp secretions. On PEG TF 6/10 - Tracheostomy was removed due to failure to well provide adequate airway 02/15/2024 pulmonary critical care consult. 7/07 Patient obtunded and desaturating on NRB; transfer to icu for intubation; updated mother over phone who wants to reverse code status to full code; ent consulted 7/09 Remains intubated on no continuous sedation, right eye open but does not track movement  7/16 Revision ENT trach with [facility] Flap 7/18 Weaned on vent 5 hours  7/19 Tolerated trach collar for the majority of the day but had some increased work of breath resulting in being placed back on vent support  7/20 No issues overnight,  back on ATC this AM 7/21 Did not tolerate ATC. Placed back on PRVC. 7/24 ATC until afternoon and went on vent for WOB reportedly  7/25 ATC for a few hours  7/26 ATC x 6h 7/28 tolerating trach collar-tolerated for most of the day 7/30 on vent overnight and back on trach collar today 8/1 back on vent overnight  8/2 was able to maintain off of ventilator overnight but by 6 AM today patient  was seen with signs of increased work of breathing including tachycardia therefore patient was placed back on ventilator 8/3 remained on ventilator overnight to transition to trach collar this a.m. 8/4 did 18 hour ATC yesterday; back on vent 3am today 8/5 TC for 12-14 hours. Then went back to vent because of tachypnea.  8/6 tolerated ATC at 30% for 24 hours. Back on vent this AM for rest as was a bit tachypneic 8/11 been off vent >24 hours. 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  8/12-off vent for over 48 hours 8/18 completed abx  8/19 Changed to cuffless trach 8/20 Increasing tachypnea, hypercarbia.  Trach changed back to #6 cuffed and brought back to ICU to be placed on ventilator.  Antibiotics broadened to linezolid  and cefepime  8/23 PSV wean 8/24 eeg for twitching no sz 8/26 started TC 8/27 did TC overnight, hypercarbic on repeat gas but compensated 04/18/2024: Last seen by ophthalmology. 04/19/2024: Transferred under [facility].  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   Controlled now on metoprolol  tartrate 75 mg bid, amlodipine  10 and PRNs. Continue management.   Acute on chronic resp failure w hypoxia and hypercapnea  Trach dependence-nocturnal ventilator dependence MRSA, Pseudomonas with trach Decannulation preparatory due to ineffective airway clearance Chronic hypoventilation: S/p tracheostomy redo 7/16.  Not a candidate for decannulation given ineffective airway clearance. Continue trach collar during day with vent support at night. Continue trach care per protocol. Trach replaced last on 06/08/24. Pulmonary following weekly -last chest x-ray 10/13-poor depth of inspiration with decreased right basilar atelectasis 10/15: I discussed with Dr. Fernanda, PCCM MD.  He discussed with patient's mother 10/14.  She does not want patient to go to a SNF that will not do vent weaning (there was a facility that was looking to accept him if he had remained on continuous vent).  He also advised that patient had been denied by [facility] and [facility].  Currently trying vent weaning trials.  10/19, tracheostomy tube was exchanged, ongoing daytime tracheostomy collar weaning attempts in the daytime, tolerates about 3 to 3.5 hours and back on vent including vent at that time.   Exposure keratopathy of left eye: Currently on only artificial tears to  both eyes. Stable.   Hypernatremia Resolved.Cont TF w/ free water  RD following, On 150 cc q 6 hr. Intermittently monitor labs. Periodically check labs may be once weekly while here. Repeating labs today.    Elevated liver enzymes:  Mild. Unclear etiology. Resolved. CK normal.     Inadequate oral intake Estimated Body mass index is 31.17 kg/m.: PEG replaced by IR on 05/15/24 after becoming dislodged again RD is following, continue tube feeding and supplements and augment.   Anemia of chronic disease Stable.  DVT prophylaxis: enoxaparin  (LOVENOX ) injection 40 mg Start: 03/06/24 1000 SCDs  Start: 10/27/23 2226   Code Status: Full Code  Family Communication:  None present at bedside.  Status is: Inpatient Remains inpatient appropriate because: Difficult to place.   Estimated body mass index is 30.64 kg/m as calculated from the following:   Height as of this encounter: 5' 8 (1.727 m).   Weight as of this encounter: 91.4 kg.    Nutritional Assessment: Body mass index is 30.64 kg/m.SABRA Seen by dietician.  I agree with the assessment and plan as outlined below: Nutrition Status: Nutrition Problem: Inadequate oral intake Etiology: inability to eat Signs/Symptoms: NPO status Interventions: Tube feeding, Prostat, MVI, Juven  . Skin Assessment: I have examined the patient's skin and I agree with the wound assessment as performed by the wound care RN as outlined below:    Consultants:  PCCM, ENT, ophthalmology,  Procedures:  As above  Antimicrobials:  Anti-infectives (From admission, onward)    Start     Dose/Rate Route Frequency Ordered Stop   04/09/24 2200  linezolid  (ZYVOX ) tablet 600 mg  Status:  Discontinued        600 mg Per Tube Every 12 hours 04/09/24 1132 04/13/24 1332   04/09/24 1700  vancomycin  (VANCOREADY) IVPB 1500 mg/300 mL  Status:  Discontinued        1,500 mg 150 mL/hr over 120 Minutes Intravenous Every 12 hours 04/09/24 1108 04/09/24 1132   04/09/24 1000  linezolid  (ZYVOX ) IVPB 600 mg  Status:  Discontinued        600 mg 300 mL/hr over 60 Minutes Intravenous Every 12 hours 04/09/24 0527 04/09/24 1108   04/01/24 1145  ceFEPIme  (MAXIPIME ) 2 g in sodium chloride  0.9 % 100 mL IVPB        2 g 200 mL/hr over 30 Minutes Intravenous Every 8 hours 04/01/24 1137 04/14/24 2230   02/21/24 1000  vancomycin  (VANCOREADY) IVPB 1250 mg/250 mL        1,250 mg 166.7 mL/hr over 90 Minutes Intravenous Every 12 hours 02/20/24 2158 02/28/24 0130   02/16/24 1445  ceFEPIme  (MAXIPIME ) 2 g in sodium chloride  0.9 % 100 mL IVPB        2 g 200 mL/hr over 30 Minutes  Intravenous Every 8 hours 02/16/24 1348 02/27/24 2322   02/16/24 0945  vancomycin  (VANCOREADY) IVPB 1500 mg/300 mL  Status:  Discontinued        1,500 mg 150 mL/hr over 120 Minutes Intravenous Every 12 hours 02/16/24 0848 02/20/24 2158   02/15/24 1215  Ampicillin -Sulbactam (UNASYN ) 3 g in sodium chloride  0.9 % 100 mL IVPB  Status:  Discontinued        3 g 200 mL/hr over 30 Minutes Intravenous Every 6 hours 02/15/24 1125 02/16/24 1226   01/24/24 2000  vancomycin  (VANCOREADY) IVPB 1250 mg/250 mL        1,250 mg 166.7 mL/hr over 90 Minutes Intravenous Every 24 hours 01/24/24 1154 01/27/24 0125  01/22/24 2200  Vancomycin  (VANCOCIN ) 1,250 mg in sodium chloride  0.9 % 250 mL IVPB  Status:  Discontinued        1,250 mg 166.7 mL/hr over 90 Minutes Intravenous Every 24 hours 01/22/24 1013 01/22/24 1020   01/22/24 2000  vancomycin  (VANCOREADY) IVPB 1250 mg/250 mL  Status:  Discontinued        1,250 mg 166.7 mL/hr over 90 Minutes Intravenous Every 24 hours 01/22/24 1020 01/24/24 1154   01/20/24 2000  ceFEPIme  (MAXIPIME ) 2 g in sodium chloride  0.9 % 100 mL IVPB  Status:  Discontinued        2 g 200 mL/hr over 30 Minutes Intravenous Every 8 hours 01/20/24 1900 01/24/24 1154   01/20/24 2000  Vancomycin  (VANCOCIN ) 1,250 mg in sodium chloride  0.9 % 250 mL IVPB  Status:  Discontinued        1,250 mg 166.7 mL/hr over 90 Minutes Intravenous Every 24 hours 01/20/24 1900 01/22/24 1013   01/03/24 1730  cefTRIAXone  (ROCEPHIN ) 1 g in sodium chloride  0.9 % 100 mL IVPB        1 g 200 mL/hr over 30 Minutes Intravenous Every 24 hours 01/03/24 1235 01/07/24 0710   01/02/24 1830  ceFEPIme  (MAXIPIME ) 2 g in sodium chloride  0.9 % 100 mL IVPB  Status:  Discontinued        2 g 200 mL/hr over 30 Minutes Intravenous Every 8 hours 01/02/24 1732 01/03/24 1235   01/02/24 1830  Vancomycin  (VANCOCIN ) 1,250 mg in sodium chloride  0.9 % 250 mL IVPB  Status:  Discontinued        1,250 mg 166.7 mL/hr over 90 Minutes Intravenous  Every 24 hours 01/02/24 1732 01/04/24 1754   01/01/24 1200  cefTRIAXone  (ROCEPHIN ) 1 g in sodium chloride  0.9 % 100 mL IVPB  Status:  Discontinued        1 g 200 mL/hr over 30 Minutes Intravenous Every 24 hours 01/01/24 1057 01/02/24 1713   11/29/23 2200  linezolid  (ZYVOX ) tablet 600 mg        600 mg Per Tube Every 12 hours 11/29/23 1034 12/04/23 2215   11/28/23 1000  linezolid  (ZYVOX ) IVPB 600 mg  Status:  Discontinued        600 mg 300 mL/hr over 60 Minutes Intravenous Every 12 hours 11/28/23 0831 11/29/23 1034   11/23/23 1200  vancomycin  (VANCOREADY) IVPB 1250 mg/250 mL  Status:  Discontinued        1,250 mg 166.7 mL/hr over 90 Minutes Intravenous Every 24 hours 11/23/23 1036 11/28/23 0831   11/20/23 1100  vancomycin  (VANCOREADY) IVPB 1250 mg/250 mL  Status:  Discontinued        1,250 mg 166.7 mL/hr over 90 Minutes Intravenous Every 24 hours 11/20/23 0923 11/20/23 0923   11/20/23 1100  vancomycin  (VANCOREADY) IVPB 1250 mg/250 mL  Status:  Discontinued        1,250 mg 166.7 mL/hr over 90 Minutes Intravenous Every 24 hours 11/20/23 0923 11/23/23 1036   11/15/23 2300  vancomycin  (VANCOREADY) IVPB 1250 mg/250 mL        1,250 mg 166.7 mL/hr over 90 Minutes Intravenous Every 24 hours 11/15/23 2215 11/19/23 0924   11/12/23 2200  vancomycin  (VANCOREADY) IVPB 750 mg/150 mL  Status:  Discontinued        750 mg 150 mL/hr over 60 Minutes Intravenous Every 12 hours 11/12/23 0855 11/15/23 2215   11/12/23 0930  vancomycin  (VANCOREADY) IVPB 2000 mg/400 mL        2,000 mg 200 mL/hr  over 120 Minutes Intravenous  Once 11/12/23 0838 11/12/23 1116   11/11/23 1345  Ampicillin -Sulbactam (UNASYN ) 3 g in sodium chloride  0.9 % 100 mL IVPB  Status:  Discontinued        3 g 200 mL/hr over 30 Minutes Intravenous Every 6 hours 11/11/23 1257 11/12/23 0836   10/28/23 0400  Ampicillin -Sulbactam (UNASYN ) 3 g in sodium chloride  0.9 % 100 mL IVPB  Status:  Discontinued        3 g 200 mL/hr over 30 Minutes Intravenous  Every 6 hours 10/27/23 2151 11/02/23 0945   10/27/23 1845  vancomycin  (VANCOCIN ) IVPB 1000 mg/200 mL premix  Status:  Discontinued        1,000 mg 200 mL/hr over 60 Minutes Intravenous  Once 10/27/23 1843 10/27/23 1843   10/27/23 1845  ceFEPIme  (MAXIPIME ) 2 g in sodium chloride  0.9 % 100 mL IVPB        2 g 200 mL/hr over 30 Minutes Intravenous  Once 10/27/23 1843 10/27/23 2024   10/27/23 1845  vancomycin  (VANCOREADY) IVPB 2000 mg/400 mL        2,000 mg 200 mL/hr over 120 Minutes Intravenous  Once 10/27/23 1843 10/27/23 2123         Subjective: Patient seen and examined.  Opens eyes to verbal stimuli and trying to track as well.  Remains in locked-in state with the status quo.  Objective: Vitals:   06/16/24 0700 06/16/24 0711 06/16/24 0823 06/16/24 0842  BP: 99/71  108/76   Pulse: 63  63 62  Resp: 12  12 19   Temp:  98.3 F (36.8 C)    TempSrc:  Oral    SpO2: 99%  97% 100%  Weight:      Height:        Intake/Output Summary (Last 24 hours) at 06/16/2024 0852 Last data filed at 06/16/2024 0603 Gross per 24 hour  Intake 850 ml  Output 1075 ml  Net -225 ml   Filed Weights   06/14/24 0417 06/15/24 0500 06/16/24 0401  Weight: 97 kg 91.4 kg 91.4 kg    Examination:  General exam: Appears calm and comfortable  Respiratory system: Clear to auscultation. Respiratory effort normal. Cardiovascular system: S1 & S2 heard, RRR. No JVD, murmurs, rubs, gallops or clicks. No pedal edema. Gastrointestinal system: Abdomen is nondistended, soft and nontender. No organomegaly or masses felt. Normal bowel sounds heard. Central nervous system: Alert.  Locked in.  Data Reviewed: I have personally reviewed following labs and imaging studies  CBC: Recent Labs  Lab 06/15/24 1344  WBC 7.8  HGB 11.3*  HCT 37.5*  MCV 91.7  PLT 330   Basic Metabolic Panel: Recent Labs  Lab 06/15/24 1344  NA 138  K 4.4  CL 99  CO2 30  GLUCOSE 111*  BUN 25*  CREATININE 0.49*  CALCIUM 9.4    GFR: Estimated Creatinine Clearance: 125.3 mL/min (A) (by C-G formula based on SCr of 0.49 mg/dL (L)). Liver Function Tests: No results for input(s): AST, ALT, ALKPHOS, BILITOT, PROT, ALBUMIN  in the last 168 hours. No results for input(s): LIPASE, AMYLASE in the last 168 hours. No results for input(s): AMMONIA in the last 168 hours. Coagulation Profile: No results for input(s): INR, PROTIME in the last 168 hours. Cardiac Enzymes: No results for input(s): CKTOTAL, CKMB, CKMBINDEX, TROPONINI in the last 168 hours. BNP (last 3 results) No results for input(s): PROBNP in the last 8760 hours. HbA1C: No results for input(s): HGBA1C in the last 72 hours. CBG: Recent  Labs  Lab 06/13/24 1744 06/13/24 2346 06/15/24 1227 06/15/24 1839 06/15/24 2324  GLUCAP 133* 137* 135* 130* 138*   Lipid Profile: No results for input(s): CHOL, HDL, LDLCALC, TRIG, CHOLHDL, LDLDIRECT in the last 72 hours. Thyroid Function Tests: No results for input(s): TSH, T4TOTAL, FREET4, T3FREE, THYROIDAB in the last 72 hours. Anemia Panel: No results for input(s): VITAMINB12, FOLATE, FERRITIN, TIBC, IRON, RETICCTPCT in the last 72 hours. Sepsis Labs: No results for input(s): PROCALCITON, LATICACIDVEN in the last 168 hours.  No results found for this or any previous visit (from the past 240 hours).   Radiology Studies: No results found.  Scheduled Meds:  amLODipine   10 mg Per Tube Daily   artificial tears   Both Eyes BID   Chlorhexidine  Gluconate Cloth  6 each Topical Daily   enoxaparin  (LOVENOX ) injection  40 mg Subcutaneous Daily   famotidine   20 mg Per Tube BID   feeding supplement (JEVITY 1.5 CAL/FIBER)  237 mL Per Tube 5 X Daily   feeding supplement (PROSource TF20)  60 mL Per Tube TID   free water   150 mL Per Tube Q6H   insulin  aspart  0-9 Units Subcutaneous Q6H   metoprolol  tartrate  75 mg Per Tube BID   multivitamin with  minerals  1 tablet Per Tube Daily   mouth rinse  15 mL Mouth Rinse 4 times per day   polyethylene glycol  17 g Per Tube Daily   Continuous Infusions:   LOS: 233 days   Fredia Skeeter, MD [facility]  06/16/2024, 8:52 AM   *Please note that this is a verbal dictation therefore any spelling or grammatical errors are due to the Dragon Medical One system interpretation.  Please page via Amion and do not message via secure chat for urgent patient care matters. Secure chat can be used for non urgent patient care matters.  How to contact the [facility] Attending or Consulting provider 7A - 7P or covering provider during after hours 7P -7A, for this patient?  Check the care team in [facility] and look for a) attending/consulting [facility] provider listed and b) the [facility] team listed. Page or secure chat 7A-7P. Log into [email or web address] and use [facility] universal password to access. If you do not have the password, please contact the hospital operator. Locate the [facility] provider you are looking for under [facility] and page to a number that you can be directly reached. If you still have difficulty reaching the provider, please page the [facility] (Director on Call) for the Hospitalists listed on amion for assistance.

## 2024-06-16 NOTE — Progress Notes (Signed)
 Nutrition Follow-up  DOCUMENTATION CODES:   Not applicable  INTERVENTION:  Continue bolus tube feeding via PEG: 5 cartons ( ) Jevity 1.5 daily 60ml water  flush before and after each bolus ( total) 60ml ProSource TF20 TID Provides 2015 kcal, 136g protein, total free water  (TF+FWF)   Given nutrition stability, RD will continue to check in monthly to ensure tube feeding regimen remains appropriate and there are no new tolerance issues. Please place RD consult if any nutrition related issues should arise.   NUTRITION DIAGNOSIS:  Inadequate oral intake related to inability to eat as evidenced by NPO status. - NPO status ongoing, receiving enteral nutrition  GOAL:  Patient will meet greater than or equal to 90% of their needs - goal met  MONITOR:  Labs, Weight trends, TF tolerance, Skin  REASON FOR ASSESSMENT:  Consult Enteral/tube feeding initiation and management  ASSESSMENT:  Pt with PMH of HTN, daily mariajuana, and smoker admitted after being found down at home with pontine hemorrhagic stroke and trace SAH. UDS positive for opioids. Noted aspiration PNA on admission.  3/08 - admitted with [facility], intubated 3/14 - s/p cortrak placement (tip gastric) 3/22 - s/p tracheostomy 3/24 - s/p PEG 4/20 - Cuff changed to cuffless 5/10 - PEG dislodged, TF's stopped  5/11 - PEG replaced, TF's restarted at goal  6/10 - trach removed 7/07- desat, transferred back to 4N ICU, re-intubated  7/16 - s/p trach placement   8/12 - off vent for >48 hours  8/20 - back to ICU on vent d/t tachypnea + hypoxia 9/18 - trach exchange 9/22 - tolerating ATC during the day and nocturnal vent 9/26 - PEG dislodged, replaced   Patient discussed in interdisciplinary rounds.  No significant changes since last assessment.   Current weight consistent with recent weight trends.   Medications: SSI 0-9 units q6h, MVI, miralax   Labs:  CBG's 130-138 x24 hours  NUTRITION - FOCUSED PHYSICAL  EXAM:  Flowsheet Row Most Recent Value  Orbital Region Mild depletion  Upper Arm Region No depletion  Thoracic and Lumbar Region No depletion  Buccal Region No depletion  Temple Region Moderate depletion  Clavicle Bone Region No depletion  Clavicle and Acromion Bone Region Mild depletion  Scapular Bone Region No depletion  Dorsal Hand Unable to assess  [atrophy,  L hand sev depletions, right hand mild edema]  Patellar Region Severe depletion  Anterior Thigh Region Severe depletion  Posterior Calf Region Unable to assess  [unna boots]  Edema (RD Assessment) Mild  [generalized, BUE, BLE]  Hair Reviewed  Eyes Other (Comment)  [left eye red]  Mouth Reviewed  Skin Reviewed  Nails Reviewed    Diet Order:   Diet Order             Diet NPO time specified  Diet effective now                   EDUCATION NEEDS:  No education needs have been identified at this time  Skin:  Skin Assessment: Reviewed RN Assessment Skin Integrity Issues:: Other (Comment) Stage II: Stage 2, perineum Other: Non pressure wound, left eye  Last BM:  10/26 type 6 small  Height:  Ht Readings from Last 1 Encounters:  06/16/24 5' 8 (1.727 m)    Weight:  Wt Readings from Last 1 Encounters:  06/16/24 91.4 kg    Ideal Body Weight:  70 kg  BMI:  Body mass index is 30.64 kg/m.  Estimated Nutritional Needs:   Kcal:  2000-2200  Protein:  125-140 grams  Fluid:  >2 L/day  Garrett [name], RDN, LDN Clinical Nutrition See AMiON for contact information.

## 2024-06-16 NOTE — Plan of Care (Signed)
  Problem: Nutrition: Goal: Risk of aspiration will decrease Outcome: Progressing   Problem: Fluid Volume: Goal: Ability to maintain a balanced intake and output will improve Outcome: Progressing   Problem: Tissue Perfusion: Goal: Adequacy of tissue perfusion will improve Outcome: Progressing   Problem: Self-Care: Goal: Ability to participate in self-care as condition permits will improve Outcome: Not Applicable

## 2024-06-16 NOTE — Plan of Care (Signed)
  Problem: Intracerebral Hemorrhage Tissue Perfusion: Goal: Complications of Intracerebral Hemorrhage will be minimized Outcome: Progressing   Problem: Nutrition: Goal: Risk of aspiration will decrease Outcome: Progressing Goal: Dietary intake will improve Outcome: Progressing   Problem: Intracerebral Hemorrhage Tissue Perfusion: Goal: Complications of Intracerebral Hemorrhage will be minimized Outcome: Progressing   Problem: Intracerebral Hemorrhage Tissue Perfusion: Goal: Complications of Intracerebral Hemorrhage will be minimized Outcome: Progressing   Problem: Nutrition: Goal: Risk of aspiration will decrease Outcome: Progressing Goal: Dietary intake will improve Outcome: Progressing   Problem: Health Behavior/Discharge Planning: Goal: Ability to manage health-related needs will improve Outcome: Progressing Goal: Goals will be collaboratively established with patient/family Outcome: Progressing   Problem: Fluid Volume: Goal: Ability to maintain a balanced intake and output will improve Outcome: Progressing

## 2024-06-16 NOTE — Evaluation (Signed)
 RT Evaluate and Treat Note  06/16/2024   Breathing is (select one): Same as normal    The following was found on auscultation (select multiple):  Bilateral Breath Sounds: Clear;Diminished (06/16/24 1239)  R Upper  Breath Sounds: Rhonchi (06/16/24 0400) L Upper Breath Sounds: Diminished (06/16/24 0400) R Lower Breath Sounds: Diminished (06/16/24 0400) L Lower Breath Sounds: Diminished (06/16/24 0158)    Cough Assessment: Cough: Productive (06/16/24 0104)    Most Recent Chest Xray:... (No results found.    The following medications and/or interventions were ordered/changed/discontinued as part of the Respiratory Treatment protocol:   Medication Changes: none   Airway Clearance Changes: none   Oxygen Therapy Changes: none

## 2024-06-16 NOTE — Progress Notes (Signed)
 NAME:  Garrett [name], MRN:  [id number], DOB:  1976-02-15, LOS: 233 ADMISSION DATE:  10/27/2023, CONSULTATION DATE:  10/27/2023 REFERRING MD: Regalado - TRH, CHIEF COMPLAINT:  Found down   History of Present Illness:  48 y/o man who was found down at home. PMHx significant for HTN.  Patient was down for an unknown amount of time, found by his fiance when she came home from work, reportedly with agonal breathing. Patient had driven her to work the morning of admission.  He has HTN but never checks his BP and does not follow with MD. Patient's fiance states that she can tell when his BP is really high; his eyes get blood shot and he is sweating. No h/o seizures. Besides almost daily marijuana and cigarette smoking, she is not aware of any other drug use. Drug screen positive for opioids and he was given several doses of Narcan . 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 BP in ED 262/156. CXR revealed RUL and perihilar infiltrates suggesting aspiration pneumonia. ED labs also revealed PH 7.169, LA 4.4, CPK 985. Patient was intubated in ED.  PCCM consulted pulmonary evaluation s/p tracheostomy.  Pertinent Medical History:  Hypertension  Significant Hospital Events: Including procedures, antibiotic start and stop dates in addition to other pertinent events   3/08 - Intubated in ED, pontine bleed/SAH. 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 3/16 - Purposeful with left upper extremity  3/22 - Tracheostomy placed at bedside, bleeding issues overnight from trach site 3/24 - PEG (Dr.  Sebastian) 3/24 - Sputum culture with MRSA 3/27 - Vomited. TF on hold. Abd film c/w ileus. Added reglan , got SSE. Did tolerate PSV all day  3/28 - BMs x2 after SSE day prior. Added back TFs at 1/2 rated. Tolerated PS 3/29 - Tolerated PS  3/31 - Tolerating CPAP PS 15/5, TF on hold due to ileus. 4/01 - Con't to hold tube feeds today, restarting vancomycin  and sending tracheal aspirate as peaks are 37, plat 24, driving 19. Thick secretions. Fever overnight.  4/02 - Peaks improved. Ongoing hiccups. Trickle feeding. Neuro exam unchanged.  4/20 - Trach was changed to cuffless #8 4/28 - Not safe to swallow. Limited communication and he follows simple commands. On TF 5/01 - On TC 28%, with large secretions. Working with speech and he is improving to initiate PMV trials under ST supervision   5/05 - On TC 30%, 7 L/min. Trach #6 cuffless, changed 5/1 to facilitate PMV trials  5/12 - On TC 21%, 6 L/min. Trach #6 cuffless, unable to produce sounds or speak on PMV. No significant resp secretions. On PEG TF 6/10 - Tracheostomy was removed due to failure to well provide adequate airway 02/15/2024 pulmonary critical care consult. 7/07 Patient obtunded and desaturating on NRB; transfer to icu for intubation; updated mother over phone who wants to reverse code status to full code; ent consulted 7/09 Remains intubated on no continuous sedation, right eye open but does not track movement  7/16 Revision ENT trach with [facility] Flap 7/18 Weaned on vent 5 hours  7/19 Tolerated trach collar for the majority of the day but had some  increased work of breath resulting in being placed back on vent support  7/20 No issues overnight, back on ATC this AM 7/21 Did not tolerate ATC. Placed back on PRVC. 7/24 ATC until afternoon and went on vent for WOB reportedly  7/25 ATC for a few hours  7/26 ATC x 6h 7/28 tolerating trach collar-tolerated for most of the day 7/30 on vent overnight and back on trach collar today 8/1 back on  vent overnight  8/2 was able to maintain off of ventilator overnight but by 6 AM today patient was seen with signs of increased work of breathing including tachycardia therefore patient was placed back on ventilator 8/3 remained on ventilator overnight to transition to trach collar this a.m. 8/4 did 18 hour ATC yesterday; back on vent 3am today 8/5 TC for 12-14 hours. Then went back to vent because of tachypnea.  8/6 tolerated ATC at 30% for 24 hours. Back on vent this AM for rest as was a bit tachypneic 8/11 been off vent >24 hours. 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  8/12-off vent for over 48 hours 8/18 completed abx  8/19 Changed to cuffless trach 8/20 Increasing tachypnea, hypercarbia.  Trach changed back to #6 cuffed and brought back to ICU to be placed on ventilator.  Antibiotics broadened to linezolid  and cefepime  8/23 PSV wean 8/24 eeg for twitching no sz 8/26 started TC 8/27 did TC overnight, hypercarbic on repeat gas but compensated 9/2 periods of apnea but overall doing ok ATC day vent night 9/8 continuing noct vent Atc day  9/22 Tolerating ATC during day and nocturnal ventilation  Interim History / Subjective:   Weekly follow-up He is tolerating trach collar in the daytime, vent at night On pressure support 12/5 at this time  Objective:   Blood pressure 103/77, pulse 65, temperature 98.3 F (36.8 C), temperature source Oral, resp. rate 13, height 5' 8 (1.727 m), weight 91.4 kg, SpO2 96%.    Vent Mode: CPAP;PSV FiO2 (%):  [28 %-40 %] 40 % Set Rate:  [12 bmp] 12 bmp Vt Set:  [550 mL] 550 mL PEEP:  [5 cmH20] 5 cmH20 Pressure Support:  [12 cmH20] 12 cmH20 Plateau Pressure:  [16 cmH20-17 cmH20] 16 cmH20   Intake/Output Summary (Last 24 hours) at 06/16/2024 1041 Last data filed at 06/16/2024 0603 Gross per 24 hour  Intake 600 ml  Output 1075 ml  Net -475 ml   Filed Weights   06/14/24 0417 06/15/24 0500  06/16/24 0401  Weight: 97 kg 91.4 kg 91.4 kg   Physical Exam: General: chronically ill appearing on mech vent HEENT: MM pink/moist; trach in place , redness left eye Neuro: tracks w/ eyes, does not follow commands locked-in CV: s1s2, RRR, no m/r/g PULM: Bilateral ventilated breath sounds, no accessory muscle use GI: soft, bsx4 active  Extremities: warm/dry, no edema  Skin: no rashes or lesions   Labs show normal electrolytes, no leukocytosis Chest x-ray 10/13 shows low lung volumes, right basilar atelectasis  Resolved Problem List:  Post sedation hypotension Left eye conjunctivitis - resolved  Hyperglycemia, resolved Pseudomonas and E. 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 Not a candidate for decannulation given ineffective airway clearance -course since then has been c/b recurrent HCAP, variable vent need-- at times having decompensations req 24hr vent.  -most recently he has had noct hypercarbia and its felt that he needs noct vent. He continues to demonstrate hypercarbia after prolonged ATC trials  Plan: Recommendations unchanged -No plan for decannulation with mental status along with increased secretions. - Continue trach collar during the day and vent at night.  At this point with previous hypercapnic respiratory failure while being off nocturnal ventilation, strongly recommend continuing nocturnal ventilation without a trial for weaning nocturnal ventilation.  Best case scenario here is  trach collar daytime and vent at night -would plan dispo accordingly - Trach care per RT. -CXR as clinically indicated Bronchodilator nebs as needed VAP bundle in place   Harden Staff MD. FCCP. [facility] Pager : [phone]  If no response to pager , please call [phone] until 7 pm After 7:00 pm call Elink  [phone]     06/16/2024, 10:41 AM

## 2024-06-17 LAB — GLUCOSE, CAPILLARY
Glucose-Capillary: 103 mg/dL — ABNORMAL HIGH (ref 70–99)
Glucose-Capillary: 93 mg/dL (ref 70–99)
Glucose-Capillary: 125 mg/dL — ABNORMAL HIGH (ref 70–99)
Glucose-Capillary: 152 mg/dL — ABNORMAL HIGH (ref 70–99)
Glucose-Capillary: 105 mg/dL — ABNORMAL HIGH (ref 70–99)

## 2024-06-17 NOTE — Plan of Care (Signed)
  Problem: Nutrition: Goal: Risk of aspiration will decrease Outcome: Progressing Goal: Dietary intake will improve Outcome: Progressing   Problem: Nutrition: Goal: Risk of aspiration will decrease Outcome: Progressing   Problem: Nutrition: Goal: Risk of aspiration will decrease Outcome: Progressing

## 2024-06-17 NOTE — Plan of Care (Signed)
  Problem: Intracerebral Hemorrhage Tissue Perfusion: Goal: Complications of Intracerebral Hemorrhage will be minimized Outcome: Progressing   Problem: Nutrition: Goal: Risk of aspiration will decrease Outcome: Progressing Goal: Dietary intake will improve Outcome: Progressing   Problem: Nutrition: Goal: Risk of aspiration will decrease Outcome: Progressing

## 2024-06-17 NOTE — Evaluation (Signed)
 RT Evaluate and Treat Note  06/17/2024   Breathing is (select one): Same as normal    The following was found on auscultation (select multiple):  Bilateral Breath Sounds: Diminished;Other (Comment);Clear (coarse) (06/17/24 1549)  R Upper  Breath Sounds: Clear (06/17/24 0800) L Upper Breath Sounds: Clear (06/17/24 0800) R Lower Breath Sounds: Clear (06/17/24 0800) L Lower Breath Sounds: Clear (06/17/24 0800)    Cough Assessment: Cough: Productive (06/16/24 0104)    Most Recent Chest Xray:... (No results found.    The following medications and/or interventions were ordered/changed/discontinued as part of the Respiratory Treatment protocol:   Medication Changes: none   Airway Clearance Changes: none   Oxygen Therapy Changes:none

## 2024-06-17 NOTE — TOC Progression Note (Signed)
 Transition of Care [facility]) - Progression Note    Patient Details  Name: Garrett [name] MRN: [id number] Date of Birth: 10-30-1975  Transition of Care [facility]) CM/SW Contact  Lendia Dais, [place] Phone Number: 06/17/2024, 3:14 PM  Clinical Narrative:  CSW spoke to Fortunato [phone]) of [facility] and stated that they have beds available and have the pt's referral but need updates of the pt's respiratory flow sheet, progress notes, nutrition, and wound notes if applicable.   Referrals faxed to [phone]. Fortunato stated she will f/u with CSW within the hour to confirm if referral was received.    Expected Discharge Plan: Long Term Nursing Home Barriers to Discharge: Continued Medical Work up, SNF Pending Medicaid, SNF Pending bed offer, SNF Pending payor source - LOG, Inadequate or no insurance (New trach)               Expected Discharge Plan and Services In-house Referral: Clinical Social Work, Hospice / Palliative Care   Post Acute Care Choice: Skilled Nursing Facility Living arrangements for the past 2 months: Single Family Home                                       Social Drivers of Health (SDOH) Interventions SDOH Screenings   Food Insecurity: Patient Unable To Answer (10/29/2023)  Social Connections: Unknown (06/23/2023)   Received from [facility]  Tobacco Use: High Risk (10/31/2023)    Readmission Risk Interventions     No data to display

## 2024-06-18 LAB — GLUCOSE, CAPILLARY
Glucose-Capillary: 110 mg/dL — ABNORMAL HIGH (ref 70–99)
Glucose-Capillary: 99 mg/dL (ref 70–99)
Glucose-Capillary: 128 mg/dL — ABNORMAL HIGH (ref 70–99)
Glucose-Capillary: 115 mg/dL — ABNORMAL HIGH (ref 70–99)

## 2024-06-18 NOTE — Plan of Care (Signed)
  Problem: Nutrition: Goal: Risk of aspiration will decrease Outcome: Progressing   Problem: Fluid Volume: Goal: Ability to maintain a balanced intake and output will improve Outcome: Progressing   Problem: Skin Integrity: Goal: Risk for impaired skin integrity will decrease Outcome: Progressing   Problem: Tissue Perfusion: Goal: Adequacy of tissue perfusion will improve Outcome: Progressing

## 2024-06-18 NOTE — Evaluation (Signed)
 RT Evaluate and Treat Note  06/18/2024   Breathing is (select one): Same as normal    The following was found on auscultation (select multiple):  Bilateral Breath Sounds: Clear (06/18/24 0743)  R Upper  Breath Sounds: Clear (06/18/24 0743) L Upper Breath Sounds: Clear (06/18/24 0743) R Lower Breath Sounds: Clear (06/18/24 0743) L Lower Breath Sounds: Clear (06/18/24 0743)    Cough Assessment: Cough: Productive (06/16/24 0104)    Most Recent Chest Xray:...10/13 right basilar atelectasis, left lung clear (No results found.   The following medications and/or interventions were ordered/changed/discontinued as part of the Respiratory Treatment protocol:   Medication Changes: No changes   Airway Clearance Changes: No changes   Oxygen Therapy Changes: No changes

## 2024-06-18 NOTE — TOC Progression Note (Addendum)
 Transition of Care [facility]) - Progression Note    Patient Details  Name: Garrett [name] MRN: [id number] Date of Birth: 10-23-1975  Transition of Care [facility]) CM/SW Contact  Garrett [name], [place] Phone Number: 06/18/2024, 11:08 AM  Clinical Narrative:  CSW spoke to [place] of [facility] via phone [phone]). Fortunato stated that she was able to offer a bed to the pt and was trying to get in contact with the pt's mother Garrett [name] but was unable to and has left multiple VM's. CSW stated that they will contact Garrett [name].  CSW called Garrett [name] and got VM. CSW attempted to leave a VM for Garrett [name] and the phone was hung up.  11:50 - CSW spoke to [place] via phone and informed Garrett [name] that Fortunato from [facility] is able to offer a bed and that they would be able to wean the pt off their ventilator. Garrett [name] stated that she would accept the bed offer. CSW gave Garrett [name] Nian number to contact. Fortunato stated she would follow up with the CSW to confirm acceptance.   1325 - CSW spoke to [place] and stated that the pt could be transferred to their [facility] Nursing facility Friday 10/31 d/t [facility] not having any beds at the moment, but will transfer pt to [facility] once a bed is open. Fortunato stated she needs [name]'s signature on their medicaid [place] application and mentioned that Garrett [name] stated she would be at the hospital today.  CSW called Garrett [name] and she stated she would be at the hospital by 3:30pm today to sign forms. CSW informed Garrett [name] that the CSW will be here until 4:30.   1545 - CSW met with Garrett [name] in ICU waiting room and Garrett [name] signed the necessary documents for the [facility] application. CSW faxed documents to [place] at [phone].  CSW will continue to monitor.    Expected Discharge Plan: Long Term Nursing Home Barriers to Discharge: Continued Medical Work up, SNF Pending Medicaid, SNF Pending bed offer, SNF Pending payor source - LOG, Inadequate or no insurance (New trach)               Expected Discharge Plan and  Services In-house Referral: Clinical Social Work, Hospice / Palliative Care   Post Acute Care Choice: Skilled Nursing Facility Living arrangements for the past 2 months: Single Family Home                                       Social Drivers of Health (SDOH) Interventions SDOH Screenings   Food Insecurity: Patient Unable To Answer (10/29/2023)  Social Connections: Unknown (06/23/2023)   Received from [facility]  Tobacco Use: High Risk (10/31/2023)    Readmission Risk Interventions     No data to display

## 2024-06-18 NOTE — Plan of Care (Signed)
  Problem: Self-Care: Goal: Ability to communicate needs accurately will improve Outcome: Not Progressing   Problem: Clinical Measurements: Goal: Ability to maintain clinical measurements within normal limits will improve Outcome: Progressing Goal: Cardiovascular complication will be avoided Outcome: Progressing   Problem: Clinical Measurements: Goal: Cardiovascular complication will be avoided Outcome: Progressing   Problem: Respiratory: Goal: Patent airway maintenance will improve Outcome: Progressing   Problem: Role Relationship: Goal: Method of communication will improve Outcome: Not Progressing

## 2024-06-19 LAB — GLUCOSE, CAPILLARY
Glucose-Capillary: 123 mg/dL — ABNORMAL HIGH (ref 70–99)
Glucose-Capillary: 108 mg/dL — ABNORMAL HIGH (ref 70–99)
Glucose-Capillary: 144 mg/dL — ABNORMAL HIGH (ref 70–99)

## 2024-06-19 NOTE — Evaluation (Signed)
 RT Evaluate and Treat Note  06/19/2024   Breathing is (select one): Same as normal    The following was found on auscultation (select multiple):  Bilateral Breath Sounds: Clear;Diminished (06/19/24 0900)  R Upper  Breath Sounds: Clear (06/19/24 0900) L Upper Breath Sounds: Clear (06/19/24 0900) R Lower Breath Sounds: Diminished (06/19/24 0900) L Lower Breath Sounds: Diminished (06/19/24 0900)    Cough Assessment: Cough: Productive (06/18/24 1100)    Most Recent Chest Xray:... (No results found.    The following medications and/or interventions were ordered/changed/discontinued as part of the Respiratory Treatment protocol:   Medication Changes: No changes at this time   Airway Clearance Changes:  No changes at this time   Oxygen Therapy Changes: No changes at this time

## 2024-06-19 NOTE — Progress Notes (Signed)
 Patient scheduled to transfer to [facility] tomorrow 10/31.    In anticipation of upcoming transfer, RN requested patient's significant other Sharene Bickers bring patient's belongings home with her that are currently bedside.   [name] advised able to take home electronics today, [name] bringing home patients laptop, cell phone, and earbuds.     [name] advised will have friend/family help her bring rest of patient belongings home tomorrow 10/31.

## 2024-06-19 NOTE — Plan of Care (Signed)
  Problem: Clinical Measurements: Goal: Ability to maintain clinical measurements within normal limits will improve Outcome: Progressing   Problem: Self-Care: Goal: Ability to communicate needs accurately will improve Outcome: Not Progressing   Problem: Health Behavior/Discharge Planning: Goal: Goals will be collaboratively established with patient/family Outcome: Progressing   Problem: Nutrition: Goal: Risk of aspiration will decrease Outcome: Progressing

## 2024-06-19 NOTE — TOC Progression Note (Signed)
 Transition of Care [facility]) - Progression Note    Patient Details  Name: Garrett [name] MRN: [id number] Date of Birth: 12/04/1975  Transition of Care [facility]) CM/SW Contact  Lendia Dais, [place] Phone Number: 06/19/2024, 3:49 PM  Clinical Narrative: CSW spoke to [place] of [facility] transportation who stated that [facility] does not provide transportation to [facility] and that they would have to be transported by [employer].  CSW spoke to Tam[place] of [employer] who stated that they would need an LOG (letter of guarantee) incase the pt's insurance is not able to cover the cost of transport. CSW gave [facility] fax number to receive LOG paperwork . Tamika stated that it could be faxed back to [phone] once signed. CSW emailed LOG paperwork to [facility] leadership for approval.  CSW spoke to [place] of [facility] and stated they can receive the pt tomorrow whenever they are ready and report can be called once DC summary is in.  PT WILL DISCHARGE TOMORROW.    Expected Discharge Plan: Long Term Nursing Home Barriers to Discharge: Continued Medical Work up, SNF Pending Medicaid, SNF Pending bed offer, SNF Pending payor source - LOG, Inadequate or no insurance (New trach)               Expected Discharge Plan and Services In-house Referral: Clinical Social Work, Hospice / Palliative Care   Post Acute Care Choice: Skilled Nursing Facility Living arrangements for the past 2 months: Single Family Home                                       Social Drivers of Health (SDOH) Interventions SDOH Screenings   Food Insecurity: Patient Unable To Answer (10/29/2023)  Social Connections: Unknown (06/23/2023)   Received from [facility]  Tobacco Use: High Risk (10/31/2023)    Readmission Risk Interventions     No data to display

## 2024-06-19 NOTE — Evaluation (Signed)
 Passy-Muir Speaking Valve - Evaluation Patient Details  Name: Garrett [name] MRN: [id number] Date of Birth: [birth year]  Today's Date: 06/19/2024 Time: 8870-8858 SLP Time Calculation (min) (ACUTE ONLY): 12 min  Past Medical History: History reviewed. No pertinent past medical history. Past Surgical History:  Past Surgical History:  Procedure Laterality Date   IR REPLACE G-TUBE SIMPLE WO FLUORO  12/30/2023   IR REPLACE G-TUBE SIMPLE WO FLUORO  05/15/2024   TRACHEOSTOMY TUBE PLACEMENT N/A 03/05/2024   Procedure: CREATION, TRACHEOSTOMY;  Surgeon: Tobie Eldora NOVAK, MD;  Location: [facility] OR;  Service: ENT;  Laterality: N/A;   HPI:  Pt is a 48 yo male who was found down 10/27/23 with agonal breathing. 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 Intubated 3/8, cortrak placed 3/14, trach placed 3/22, PEG placed 3/24; decannulated 6/10. Resp distress and intubated 7/10; trach 7/16, slow wean, ultimately with ATC during day and vent at night. No plans for decannulation. Pt was followed by SLP for communication, cognition, and PMV from 5/1-7/8. D/Cd from SLP due to declining function and reintubation.  New orders received for PMV eval on 11/30. PMH: HTN, smoker    Assessment / Plan / Recommendation  Clinical Impression  Pt with inconsistent toleration of PMV during today's assessment. Garrett [name] was at the bedside. Pt is to be D/Cd tomorrow to facility in [place].  He participated in limited PMV evaluation.  Cuff deflated at bedside; pt responding to girlfriend with hand squeezes and occasional head nodding/shaking for yes and no.  VS stable; valve placed. He achieved spontaneous, low volume phonation on exhalation.  Audible secretions present.  Valve was removed after 3-5 respiratory cycles, intermittently revealing back pressure upon removal.  Adjustments made to positioning and oral suctioning provided. No improvements noted in access to upper airway.    Recommend PMV trials with SLP only s/p discharge.  Will need SLP f/u at facility.  Valve left with Garrett [name].  Will sign off given tomorrow's D/C.   SLP Visit Diagnosis: Aphonia (R49.1)    Recommendations for use/ supervision  Patient may use Passy-Muir Speech Valve: with SLP only   SLP Assessment  All further Speech Language Pathology needs can be addressed in the next venue of care   Assistance Recommended at Discharge    Functional Status Assessment    Frequency and Duration         PMSV Trial PMSV was placed for: intervals of 3-5 breaths Able to redirect subglottic air through upper airway:  (intermittently) Able to Attain Phonation: Yes Voice Quality: Low vocal intensity Able to Expectorate Secretions: No attempts Level of Secretion Expectoration with PMSV: Tracheal Breath Support for Phonation: Severely decreased Intelligibility: Unable to assess (comment) Respirations During Trial: (!) 34 SpO2 During Trial: 92 % Behavior: Alert   Tracheostomy Tube       Vent Dependency  Vent Dependent: No    Cuff Deflation Trial Tolerated Cuff Deflation: Yes Length of Time for Cuff Deflation Trial:  (deflated at baseline)  Garrett Luba L. [place], MA CCC/SLP Clinical Specialist - Acute Care SLP Acute Rehabilitation Services Office number [phone]        Garrett [name] 06/19/2024, 12:09 PM

## 2024-06-20 DIAGNOSIS — G835 Locked-in state: Secondary | ICD-10-CM | POA: Diagnosis not present

## 2024-06-20 DIAGNOSIS — Z930 Tracheostomy status: Secondary | ICD-10-CM | POA: Diagnosis not present

## 2024-06-20 DIAGNOSIS — J9622 Acute and chronic respiratory failure with hypercapnia: Secondary | ICD-10-CM | POA: Diagnosis not present

## 2024-06-20 LAB — GLUCOSE, CAPILLARY
Glucose-Capillary: 140 mg/dL — ABNORMAL HIGH (ref 70–99)
Glucose-Capillary: 144 mg/dL — ABNORMAL HIGH (ref 70–99)
Glucose-Capillary: 163 mg/dL — ABNORMAL HIGH (ref 70–99)
Glucose-Capillary: 168 mg/dL — ABNORMAL HIGH (ref 70–99)
Glucose-Capillary: 96 mg/dL (ref 70–99)
Glucose-Capillary: 97 mg/dL (ref 70–99)

## 2024-06-20 MED ORDER — ORAL CARE MOUTH RINSE: 15.0000 mL | OROMUCOSAL | Status: AC | PRN

## 2024-06-20 MED ORDER — METOPROLOL TARTRATE 75 MG PO TABS: 75.0000 mg | ORAL_TABLET | Freq: Two times a day (BID) | ORAL | Status: AC

## 2024-06-20 MED ORDER — ACETAMINOPHEN 160 MG/5ML PO SOLN: 650.0000 mg | ORAL | Status: AC | PRN

## 2024-06-20 MED ORDER — CHLORHEXIDINE GLUCONATE CLOTH 2 % EX PADS: 6.0000 | MEDICATED_PAD | Freq: Every day | CUTANEOUS | Status: AC

## 2024-06-20 MED ORDER — JEVITY 1.5 CAL/FIBER PO LIQD: 237.0000 mL | Freq: Every day | ORAL | Status: AC

## 2024-06-20 MED ORDER — ENOXAPARIN SODIUM 40 MG/0.4ML IJ SOSY: 40.0000 mg | PREFILLED_SYRINGE | Freq: Every day | INTRAMUSCULAR | Status: AC

## 2024-06-20 MED ORDER — AMLODIPINE BESYLATE 10 MG PO TABS: 10.0000 mg | ORAL_TABLET | Freq: Every day | ORAL | Status: AC

## 2024-06-20 MED ORDER — INSULIN ASPART 100 UNIT/ML IJ SOLN: 0.0000 [IU] | Freq: Four times a day (QID) | INTRAMUSCULAR | Status: AC

## 2024-06-20 MED ORDER — BISACODYL 10 MG RE SUPP: 10.0000 mg | Freq: Every day | RECTAL | Status: AC | PRN

## 2024-06-20 MED ORDER — TRAZODONE HCL 50 MG PO TABS: 50.0000 mg | ORAL_TABLET | Freq: Every evening | ORAL | Status: AC | PRN

## 2024-06-20 MED ORDER — PROSOURCE TF20 ENFIT COMPATIBL EN LIQD: 60.0000 mL | Freq: Three times a day (TID) | ENTERAL | Status: AC

## 2024-06-20 MED ORDER — FREE WATER: 150.0000 mL | Freq: Four times a day (QID) | Status: AC

## 2024-06-20 MED ORDER — ARTIFICIAL TEARS OPHTHALMIC OINT: TOPICAL_OINTMENT | Freq: Two times a day (BID) | OPHTHALMIC | Status: AC

## 2024-06-20 MED ORDER — IPRATROPIUM-ALBUTEROL 0.5-2.5 (3) MG/3ML IN SOLN: 3.0000 mL | RESPIRATORY_TRACT | Status: AC | PRN

## 2024-06-20 MED ORDER — POLYETHYLENE GLYCOL 3350 17 G PO PACK: 17.0000 g | PACK | Freq: Every day | ORAL | Status: AC

## 2024-06-20 MED ORDER — GERHARDT'S BUTT CREAM: 1.0000 | TOPICAL_CREAM | CUTANEOUS | Status: AC | PRN

## 2024-06-20 MED ORDER — ORAL CARE MOUTH RINSE: 15.0000 mL | Freq: Three times a day (TID) | OROMUCOSAL | Status: AC

## 2024-06-20 MED ORDER — FAMOTIDINE 20 MG PO TABS: 20.0000 mg | ORAL_TABLET | Freq: Two times a day (BID) | ORAL | Status: AC

## 2024-06-20 MED ORDER — ACETAMINOPHEN 650 MG RE SUPP: 650.0000 mg | RECTAL | Status: AC | PRN

## 2024-06-20 MED ORDER — HYDRALAZINE HCL 20 MG/ML IJ SOLN: 10.0000 mg | INTRAMUSCULAR | Status: AC | PRN

## 2024-06-20 MED ORDER — ACETAMINOPHEN 325 MG PO TABS: 650.0000 mg | ORAL_TABLET | ORAL | Status: AC | PRN

## 2024-06-20 MED ORDER — ADULT MULTIVITAMIN W/MINERALS CH: 1.0000 | ORAL_TABLET | Freq: Every day | ORAL | Status: AC

## 2024-06-20 NOTE — Discharge Summary (Addendum)
 Physician Discharge Summary  Patient ID: Garrett [name] MRN: [id number] DOB/AGE: [birth year] 48 y.o.  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  He had a prolonged hospital stay since then.  More recently he has not been able to tolerate continuous trach masking and required overnight ventilator due to episodes of apnea and developing hypercapnia                 KEY EVENTS 3/08 - Intubated in ED, pontine bleed/SAH. CT head 17:09 Acute large hemorrhage 4.1 cm in pons with intraventricular extension without hydrocephalus 3/09 - CTA 03:14 AM No change in IPH in pons, similar biparietal [facility] 3/14 - Now awake, appears locked in can follow commands with eyes. 3/16 - Purposeful with left upper extremity  3/22 - Tracheostomy placed at bedside, bleeding issues overnight from trach site 3/24 - PEG (Dr. Sebastian) 3/24 - Sputum culture with MRSA 3/27 - Vomited. TF on hold. Abd film c/w ileus. Added reglan , got SSE. Did tolerate PSV all day  3/28 - BMs x2 after SSE day prior. Added back TFs at 1/2 rated. Tolerated PS 3/29 - Tolerated PS  3/31 - Tolerating CPAP PS 15/5, TF on hold due to ileus. 4/01 - Con't to hold tube feeds today, restarting vancomycin  and sending tracheal aspirate as peaks are 37, plat 24, driving 19. Thick secretions. Fever overnight.  4/02 - Peaks improved. Ongoing hiccups. Trickle feeding. Neuro exam  unchanged.  4/20 - Trach was changed to cuffless #8 4/28 - Not safe to swallow. Limited communication and he follows simple commands. On TF 5/01 - On TC 28%, with large secretions. Working with speech and he is improving to initiate PMV trials under ST supervision   5/05 - On TC 30%, 7 L/min. Trach #6 cuffless, changed 5/1 to facilitate PMV trials  5/12 - On TC 21%, 6 L/min. Trach #6 cuffless, unable to produce sounds or speak on PMV. No significant resp secretions. On PEG TF 6/10 - Tracheostomy was removed due to failure to well provide adequate airway 02/15/2024 pulmonary critical care consult. 7/07 Patient obtunded and desaturating on NRB; transfer to icu for intubation; updated mother over phone who wants to reverse code status to full code; ent consulted 7/09 Remains intubated on no continuous sedation, right eye open but does not track movement  7/16 Revision ENT trach with [facility] Flap 7/18 Weaned on vent 5 hours  7/19 Tolerated trach collar for the majority of the day but had some increased work of breath resulting in being placed back on vent support  7/20 No issues overnight, back on ATC this AM 7/21 Did not tolerate ATC. Placed back on PRVC. 7/24 ATC until afternoon and went on vent for WOB reportedly  7/25 ATC for a few hours  7/26 ATC x 6h 7/28 tolerating trach collar-tolerated for most of the day 7/30 on vent overnight and back on trach collar today 8/1 back on vent overnight  8/2 was able to  maintain off of ventilator overnight but by 6 AM today patient was seen with signs of increased work of breathing including tachycardia therefore patient was placed back on ventilator 8/3 remained on ventilator overnight to transition to trach collar this a.m. 8/4 did 18 hour ATC yesterday; back on vent 3am today 8/5 TC for 12-14 hours. Then went back to vent because of tachypnea.  8/6 tolerated ATC at 30% for 24 hours. Back on vent this AM for rest as was a bit tachypneic 8/11 been off vent  >24 hours. 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  8/12-off vent for over 48 hours 8/18 completed abx  8/19 Changed to cuffless trach 8/20 Increasing tachypnea, hypercarbia.  Trach changed back to #6 cuffed and brought back to ICU to be placed on ventilator.  Antibiotics broadened to linezolid  and cefepime  8/23 PSV wean 8/24 eeg for twitching no sz 8/26 started TC 8/27 did TC overnight, hypercarbic on repeat gas but compensated 9/2 periods of apnea but overall doing ok ATC day vent night 9/8 continuing noct vent Atc day  9/22 Tolerating ATC during day and nocturnal ventilation    SIGNIFICANT DIAGNOSTIC STUDIES Most recent CT head on 03/08/2024 with encephalomalacia of the left brainstem at the site of his prior hemorrhage   MICRO DATA  Blood cultures 05/10/2024 with no growth     Discharge Exam: General: Unresponsive, not alert, eyes open spontaneously Neuro: Limited exam, patient does not follow commands, no movement noted in upper or lower extremities CV: Regular rate and rhythm, normal S1, S2 PULM: Coarse ventilatory breath sounds bilaterally GI: PEG tube in place, abdomen soft, nondistended Extremities: Warm, well-perfused    Vitals:   06/20/24 0500 06/20/24 0600 06/20/24 0700 06/20/24 0737  BP: 112/88 (!) 126/90 120/89   Pulse: 81 84 80 79  Resp: 12 13 13 12   Temp:    98.4 F (36.9 C)  TempSrc:    Axillary  SpO2: 96% 98% 96% 98%  Weight: 91.8 kg     Height:       Current vent settings PRVC Rate of 12 Tidal volume 550 PEEP of 5 FiO2 40%  Discharge Labs  BMET Recent Labs  Lab 06/15/24 1344  NA 138  K 4.4  CL 99  CO2 30  GLUCOSE 111*  BUN 25*  CREATININE 0.49*  CALCIUM 9.4    CBC Recent Labs  Lab 06/15/24 1344  HGB 11.3*  HCT 37.5*  WBC 7.8  PLT 330    Anti-Coagulation No results for input(s): INR in the last 168 hours.      Follow-up Information     Why [facility]. Schedule an appointment as soon as possible for a visit in 2 month(s).   Specialty: Neurology Why: stroke clinic Contact information: [address] [place]  [phone]                 Allergies as of 06/20/2024       Reactions   Tomato Hives, Itching, Rash        Medication List     STOP taking these medications    ibuprofen 200 MG tablet Commonly known as: ADVIL       TAKE these medications    acetaminophen  325 MG tablet Commonly known as: TYLENOL  Place 2 tablets (650 mg total) into feeding tube every 4 (four) hours as needed for mild pain (pain score 1-3) (or temp > 37.5  C (99.5 F)). What changed:  medication strength how much to take how to take this when to take this reasons to take this   acetaminophen  160 MG/5ML solution Commonly known as: TYLENOL  Place 20.3 mLs (650 mg total) into feeding tube every 4 (four) hours as needed for mild pain (pain score 1-3) (or temp > 37.5 C (99.5 F)). What changed: You were already taking a medication with the same name, and this prescription was added. Make sure you understand how and when to take each.   acetaminophen  650 MG suppository Commonly known as: TYLENOL  Place 1 suppository (650 mg total) rectally every 4 (four) hours as needed for mild pain (pain score 1-3) (or temp > 37.5 C (99.5 F)). What changed: You were already taking a medication with the same name, and this prescription was added. Make sure you understand how and when to take each.   amLODipine  10 MG tablet Commonly known as: NORVASC  Place 1 tablet (10 mg total) into feeding tube daily.   artificial tears Oint ophthalmic ointment Commonly known as: LACRILUBE Place into both eyes 2 (two) times daily.   bisacodyl  10 MG suppository Commonly known as: DULCOLAX Place 1 suppository (10 mg total) rectally daily as needed for moderate constipation.   Chlorhexidine  Gluconate Cloth 2 %  Pads Apply 6 each topically daily.   enoxaparin  40 MG/0.4ML injection Commonly known as: LOVENOX  Inject 0.4 mLs (40 mg total) into the skin daily.   famotidine  20 MG tablet Commonly known as: PEPCID  Place 1 tablet (20 mg total) into feeding tube 2 (two) times daily.   feeding supplement (JEVITY 1.5 CAL/FIBER) Liqd Place 237 mLs into feeding tube 5 (five) times daily.   feeding supplement (PROSource TF20) liquid Place 60 mLs into feeding tube 3 (three) times daily.   free water  Soln Place 150 mLs into feeding tube every 6 (six) hours.   Gerhardt's butt cream Crea Apply 1 Application topically as needed for irritation.   hydrALAZINE  20 MG/ML injection Commonly known as: APRESOLINE  Inject 0.5 mLs (10 mg total) into the vein every 4 (four) hours as needed (For SBP>160 OR DBP>100.).   insulin  aspart 100 UNIT/ML injection Commonly known as: novoLOG  Inject 0-9 Units into the skin every 6 (six) hours.   ipratropium-albuterol  0.5-2.5 (3) MG/3ML Soln Commonly known as: DUONEB Take 3 mLs by nebulization every 4 (four) hours as needed.   Metoprolol  Tartrate 75 MG Tabs Place 1 tablet (75 mg total) into feeding tube 2 (two) times daily.   mouth rinse Liqd solution 15 mLs by Mouth Rinse route 3 (three) times daily.   mouth rinse Liqd solution 15 mLs by Mouth Rinse route as needed (oral care).   multivitamin with minerals Tabs tablet Place 1 tablet into feeding tube daily.   polyethylene glycol 17 g packet Commonly known as: MIRALAX  / GLYCOLAX  Place 17 g into feeding tube daily.   traZODone  50 MG tablet Commonly known as: DESYREL  Place 1 tablet (50 mg total) into feeding tube at bedtime as needed for sleep.          Disposition:   Discharged Condition: Garrett [name] has met maximum benefit of inpatient care and is medically stable and cleared for discharge.  Patient is pending follow up as above.      Time spent on disposition:  23 Minutes.     Zola Herter,  MD [facility] Office: [phone]   See Amion for personal pager PCCM on call pager [phone] until 7pm. Please call  Elink 7p-7a. [phone]   Please see [email or web address] for pager details.

## 2024-06-20 NOTE — Progress Notes (Signed)
 NAME:  Garrett [name], MRN:  [id number], DOB:  Jun 07, 1976, LOS: 237 ADMISSION DATE:  10/27/2023, CONSULTATION DATE:  10/27/2023 REFERRING MD: Regalado - TRH, CHIEF COMPLAINT:  Found down   History of Present Illness:  48 y/o man who was found down at home. PMHx significant for HTN.  Patient was down for an unknown amount of time, found by his fiance when she came home from work, reportedly with agonal breathing. Patient had driven her to work the morning of admission.  He has HTN but never checks his BP and does not follow with MD. Patient's fiance states that she can tell when his BP is really high; his eyes get blood shot and he is sweating. No h/o seizures. Besides almost daily marijuana and cigarette smoking, she is not aware of any other drug use. Drug screen positive for opioids and he was given several doses of Narcan . 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 BP in ED 262/156. CXR revealed RUL and perihilar infiltrates suggesting aspiration pneumonia. ED labs also revealed PH 7.169, LA 4.4, CPK 985. Patient was intubated in ED.  PCCM consulted pulmonary evaluation s/p tracheostomy.  Pertinent Medical History:  Hypertension  Significant Hospital Events: Including procedures, antibiotic start and stop dates in addition to other pertinent events   3/08 - Intubated in ED, pontine bleed/SAH. CT head 17:09 Acute large hemorrhage 4.1 cm in pons with intraventricular extension without hydrocephalus 3/09 - CTA 03:14 AM No change in IPH in pons, similar biparietal [facility] 3/14 - Now awake, appears locked in can follow commands with eyes. 3/16 - Purposeful with left upper extremity  3/22 - Tracheostomy placed at bedside, bleeding issues overnight from trach site 3/24 - PEG (Dr.  Sebastian) 3/24 - Sputum culture with MRSA 3/27 - Vomited. TF on hold. Abd film c/w ileus. Added reglan , got SSE. Did tolerate PSV all day  3/28 - BMs x2 after SSE day prior. Added back TFs at 1/2 rated. Tolerated PS 3/29 - Tolerated PS  3/31 - Tolerating CPAP PS 15/5, TF on hold due to ileus. 4/01 - Con't to hold tube feeds today, restarting vancomycin  and sending tracheal aspirate as peaks are 37, plat 24, driving 19. Thick secretions. Fever overnight.  4/02 - Peaks improved. Ongoing hiccups. Trickle feeding. Neuro exam unchanged.  4/20 - Trach was changed to cuffless #8 4/28 - Not safe to swallow. Limited communication and he follows simple commands. On TF 5/01 - On TC 28%, with large secretions. Working with speech and he is improving to initiate PMV trials under ST supervision   5/05 - On TC 30%, 7 L/min. Trach #6 cuffless, changed 5/1 to facilitate PMV trials  5/12 - On TC 21%, 6 L/min. Trach #6 cuffless, unable to produce sounds or speak on PMV. No significant resp secretions. On PEG TF 6/10 - Tracheostomy was removed due to failure to well provide adequate airway 02/15/2024 pulmonary critical care consult. 7/07 Patient obtunded and desaturating on NRB; transfer to icu for intubation; updated mother over phone who wants to reverse code status to full code; ent consulted 7/09 Remains intubated on no continuous sedation, right eye open but does not track movement  7/16 Revision ENT trach with [facility] Flap 7/18 Weaned on vent 5 hours  7/19 Tolerated trach collar for the majority of the day but had some  increased work of breath resulting in being placed back on vent support  7/20 No issues overnight, back on ATC this AM 7/21 Did not tolerate ATC. Placed back on PRVC. 7/24 ATC until afternoon and went on vent for WOB reportedly  7/25 ATC for a few hours  7/26 ATC x 6h 7/28 tolerating trach collar-tolerated for most of the day 7/30 on vent overnight and back on trach collar today 8/1 back on  vent overnight  8/2 was able to maintain off of ventilator overnight but by 6 AM today patient was seen with signs of increased work of breathing including tachycardia therefore patient was placed back on ventilator 8/3 remained on ventilator overnight to transition to trach collar this a.m. 8/4 did 18 hour ATC yesterday; back on vent 3am today 8/5 TC for 12-14 hours. Then went back to vent because of tachypnea.  8/6 tolerated ATC at 30% for 24 hours. Back on vent this AM for rest as was a bit tachypneic 8/11 been off vent >24 hours. 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  8/12-off vent for over 48 hours 8/18 completed abx  8/19 Changed to cuffless trach 8/20 Increasing tachypnea, hypercarbia.  Trach changed back to #6 cuffed and brought back to ICU to be placed on ventilator.  Antibiotics broadened to linezolid  and cefepime  8/23 PSV wean 8/24 eeg for twitching no sz 8/26 started TC 8/27 did TC overnight, hypercarbic on repeat gas but compensated 9/2 periods of apnea but overall doing ok ATC day vent night 9/8 continuing noct vent Atc day  9/22 Tolerating ATC during day and nocturnal ventilation  Interim History / Subjective:   Weekly follow-up He is tolerating trach collar in the daytime, vent at night On pressure support 12/5 at this time  Objective:   Blood pressure (!) 139/94, pulse 78, temperature 99.2 F (37.3 C), temperature source Axillary, resp. rate (!) 23, height 5' 8 (1.727 m), weight 91.8 kg, SpO2 93%.    Vent Mode: PRVC FiO2 (%):  [28 %-40 %] 28 % Set Rate:  [12 bmp] 12 bmp Vt Set:  [550 mL] 550 mL PEEP:  [5 cmH20] 5 cmH20 Plateau Pressure:  [14 cmH20-15 cmH20] 15 cmH20   Intake/Output Summary (Last 24 hours) at 06/20/2024 1300 Last data filed at 06/20/2024 0506 Gross per 24 hour  Intake 1458 ml  Output 1610 ml  Net -152 ml   Filed Weights   06/18/24 0500 06/19/24 0500 06/20/24 0500  Weight: 95 kg 92.9 kg  91.8 kg   Physical Exam: General: ill appearing on mechanical ventilation HEENT: trach in place, no oozing or erythema Neuro: does not track with eyes, does not follow commands  CV: RRR, Nl S1,S2  PULM: Coarse vent breath sounds  GI: soft, Nt, ND  Extremities: warm/dry, no edema  Skin: no rashes or lesions     Resolved Problem List:  Post sedation hypotension Left eye conjunctivitis - resolved  Hyperglycemia, resolved Pseudomonas and E. 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 Not a candidate for decannulation given ineffective airway clearance -course since then has been  c/b recurrent HCAP, variable vent need-- at times having decompensations req 24hr vent.  -most recently he has had noct hypercarbia and its felt that he needs noct vent. He continues to demonstrate hypercarbia after prolonged ATC trials  Appears stable for days on current regimen. Unclear if weaning is helpful at this point as it does not seem to be a bridge to recovery. No plan for decannulation, can consider vent during night time and TC during day time.   Dispo: Plan for LTAC tomorrow   Zola Herter, MD [facility] Office: [phone]   See Amion for personal pager PCCM on call pager [phone] until 7pm. Please call Elink 7p-7a. [phone]   If no response to pager , please call [phone] until 7 pm After 7:00 pm call Elink  [phone]     06/20/2024, 1:00 PM

## 2024-06-20 NOTE — TOC Progression Note (Signed)
 Transition of Care [facility]) - Progression Note    Patient Details  Name: Garrett [name] MRN: [id number] Date of Birth: Oct 03, 1975  Transition of Care [facility]) CM/SW Contact  Lendia Dais, [place] Phone Number: 06/20/2024, 9:55 AM  Clinical Narrative:  CSW spoke to pt's mother Shawnee and informed her that CSW is waiting for leadership to sign LOG paper work and explain it is for transportation cost that the insurance may not cover. Shawnee stated understanding and was informed the pt may DC before she can visit. Shawnee stated understanding and mentioned she will travel to the facility to see the pt.  CSW spoke to [place] of [facility] and stated they can receive the patient at any time.  CSW will continue to monitor.    Expected Discharge Plan: Long Term Nursing Home Barriers to Discharge: Continued Medical Work up, SNF Pending Medicaid, SNF Pending bed offer, SNF Pending payor source - LOG, Inadequate or no insurance (New trach)               Expected Discharge Plan and Services In-house Referral: Clinical Social Work, Hospice / Palliative Care   Post Acute Care Choice: Skilled Nursing Facility Living arrangements for the past 2 months: Single Family Home                                       Social Drivers of Health (SDOH) Interventions SDOH Screenings   Food Insecurity: Patient Unable To Answer (10/29/2023)  Social Connections: Unknown (06/23/2023)   Received from [facility]  Tobacco Use: High Risk (10/31/2023)    Readmission Risk Interventions     No data to display

## 2024-06-20 NOTE — Evaluation (Signed)
 RT Evaluate and Treat Note  06/20/2024   Breathing is (select one): Same as normal    The following was found on auscultation (select multiple):  Bilateral Breath Sounds: Clear;Diminished (06/20/24 0759)  R Upper  Breath Sounds: Clear (06/20/24 0410) L Upper Breath Sounds: Clear (06/20/24 0410) R Lower Breath Sounds: Diminished (06/20/24 0410) L Lower Breath Sounds: Diminished (06/20/24 0410)    Cough Assessment: Cough: Productive (06/20/24 0052)    Most Recent Chest Xray:... (No results found.    The following medications and/or interventions were ordered/changed/discontinued as part of the Respiratory Treatment protocol:   Medication Changes: No new changes   Airway Clearance Changes:  No new changes   Oxygen Therapy Changes: No new changes

## 2024-06-20 NOTE — TOC Progression Note (Addendum)
 Transition of Care [facility]) - Progression Note    Patient Details  Name: Garrett [name] MRN: [id number] Date of Birth: March 01, 1976  Transition of Care [facility]) CM/SW Contact  Garrett [name], [place] Phone Number: 06/20/2024, 1:05 PM  Clinical Narrative:  CSW spoke to [place] of  [employer] and stated that they received the LOG and that the earliest time for transport they had was after midnight. CSW spoke to Boykin (pt's mother) via phone and requested for transportation the next day.  CSW called [facility] again and spoke to Severance. Vince stated they received the LOG. CSW mentioned typo of the pt's last name and Fabiene stated it wouldn't be a problem.  Transportation is scheduled for tomorrow 06/21/2024 at 10:30. CSW informed medical team via secure chat. Pt's mother aware.  TOC will continue to follow for transport.    Expected Discharge Plan: Long Term Nursing Home Barriers to Discharge: Continued Medical Work up, SNF Pending Medicaid, SNF Pending bed offer, SNF Pending payor source - LOG, Inadequate or no insurance (New trach)               Expected Discharge Plan and Services In-house Referral: Clinical Social Work, Hospice / Palliative Care   Post Acute Care Choice: Skilled Nursing Facility Living arrangements for the past 2 months: Single Family Home                                       Social Drivers of Health (SDOH) Interventions SDOH Screenings   Food Insecurity: Patient Unable To Answer (10/29/2023)  Social Connections: Unknown (06/23/2023)   Received from [facility]  Tobacco Use: High Risk (10/31/2023)    Readmission Risk Interventions     No data to display

## 2024-06-21 NOTE — Evaluation (Signed)
 RT Evaluate and Treat Note  06/21/2024   Breathing is (select one): Same as normal    The following was found on auscultation (select multiple):  Bilateral Breath Sounds: Diminished;Rhonchi (06/21/24 0800)  R Upper  Breath Sounds: Diminished;Rhonchi (06/21/24 0800) L Upper Breath Sounds: Diminished;Rhonchi (06/21/24 0800) R Lower Breath Sounds: Diminished (06/21/24 0800) L Lower Breath Sounds: Diminished (06/21/24 0800)    Cough Assessment: Cough: Strong (06/21/24 0800)    Most Recent Chest Xray:... (No results found.    The following medications and/or interventions were ordered/changed/discontinued as part of the Respiratory Treatment protocol:   Medication Changes: no change   Airway Clearance Changes: no changes   Oxygen Therapy Changes:no changes

## 2024-06-21 NOTE — TOC Transition Note (Signed)
 Transition of Care [facility]) - Discharge Note   Patient Details  Name: Garrett [name] MRN: [id number] Date of Birth: [birth year]  Transition [facility]) CM/SW Contact:  Bridget Cordella Simmonds, LCSW Phone Number: 06/21/2024, 9:53 AM   Clinical Narrative:   Pt discharging to [facility] and rehab, [address], [place], [place] [address].  RN call report to [phone], hallway 1.  [facility] scheduled [facility] 1030am pickup.     Final next level of care: Skilled Nursing Facility Barriers to Discharge: Barriers Resolved   Patient Goals and CMS Choice Patient states their goals for this hospitalization and ongoing recovery are:: Rehab CMS Medicare.gov Compare Post Acute Care list provided to:: Patient Represenative (must comment) Choice offered to / list presented to : Parent [place] ownership interest in [facility].provided to:: Parent NA    Discharge Placement              Patient chooses bed at:  [facility]) Patient to be transferred to facility by: [facility] Name of family member notified: messages left with mother [name] Patient and family notified of of transfer: 06/21/24  Discharge Plan and Services Additional resources added to the After Visit Summary for   In-house Referral: Clinical Social Work, Hospice / Palliative Care   Post Acute Care Choice: Skilled Nursing Facility                               Social Drivers of Health (SDOH) Interventions SDOH Screenings   Food Insecurity: Patient Unable To Answer (10/29/2023)  Social Connections: Unknown (06/23/2023)   Received from [facility]  Tobacco Use: High Risk (10/31/2023)     Readmission Risk Interventions     No data to display

## 2024-06-21 NOTE — Progress Notes (Signed)
 Pt transport via ambulance with all belongings and supplies in room. Pt put on venturi trach collar. No s/s pain discomfort noted on transfer to transport stretcher. Call out to mother and significant other to update on pt leaving facility. No questions or needs voiced.  Call out to facility to update transport time.

## 2024-06-21 NOTE — TOC Progression Note (Addendum)
 Transition of Care [facility]) - Progression Note    Patient Details  Name: Garrett [name] MRN: [id number] Date of Birth: 1976-03-02  Transition [facility]) CM/SW Contact  Bridget Cordella Simmonds, LCSW Phone Number: 06/21/2024, 9:22 AM  Clinical Narrative:   CSW confirmed with [facility]  Nursing that they can receive pt today.  She is asking for trach/vent settings.  Team informed. Requesting DC summary be faxed to [phone].  0945: DC summary faxed.  CSW spoke with Fortunato, settings from RN were provided.     Expected Discharge Plan: Long Term Nursing Home Barriers to Discharge: Continued Medical Work up, SNF Pending Medicaid, SNF Pending bed offer, SNF Pending payor source - LOG, Inadequate or no insurance (New trach)               Expected Discharge Plan and Services In-house Referral: Clinical Social Work, Hospice / Palliative Care   Post Acute Care Choice: Skilled Nursing Facility Living arrangements for the past 2 months: Single Family Home                                       Social Drivers of Health (SDOH) Interventions SDOH Screenings   Food Insecurity: Patient Unable To Answer (10/29/2023)  Social Connections: Unknown (06/23/2023)   Received from [facility]  Tobacco Use: High Risk (10/31/2023)    Readmission Risk Interventions     No data to display

## 2024-06-23 LAB — GLUCOSE, CAPILLARY: Glucose-Capillary: 101 mg/dL — ABNORMAL HIGH (ref 70–99)

## 2024-07-05 DIAGNOSIS — K56609 Unspecified intestinal obstruction, unspecified as to partial versus complete obstruction: Secondary | ICD-10-CM | POA: Diagnosis not present

## 2024-07-05 DIAGNOSIS — K5641 Fecal impaction: Secondary | ICD-10-CM | POA: Diagnosis not present

## 2024-07-07 DIAGNOSIS — K56609 Unspecified intestinal obstruction, unspecified as to partial versus complete obstruction: Secondary | ICD-10-CM | POA: Diagnosis not present
